# Patient Record
Sex: Male | Born: 1960 | Race: Black or African American | Hispanic: No | Marital: Married | Smoking: Current some day smoker
Health system: Southern US, Community
[De-identification: ages and names within clinical notes are randomized; demographics above are authoritative.]

## PROBLEM LIST (undated history)

## (undated) DIAGNOSIS — M2550 Pain in unspecified joint: Secondary | ICD-10-CM

## (undated) DIAGNOSIS — R350 Frequency of micturition: Secondary | ICD-10-CM

## (undated) DIAGNOSIS — G4730 Sleep apnea, unspecified: Secondary | ICD-10-CM

## (undated) DIAGNOSIS — IMO0001 Reserved for inherently not codable concepts without codable children: Secondary | ICD-10-CM

## (undated) DIAGNOSIS — I219 Acute myocardial infarction, unspecified: Secondary | ICD-10-CM

## (undated) DIAGNOSIS — M549 Dorsalgia, unspecified: Secondary | ICD-10-CM

## (undated) DIAGNOSIS — I639 Cerebral infarction, unspecified: Secondary | ICD-10-CM

## (undated) DIAGNOSIS — T8859XA Other complications of anesthesia, initial encounter: Secondary | ICD-10-CM

## (undated) DIAGNOSIS — M1990 Unspecified osteoarthritis, unspecified site: Secondary | ICD-10-CM

## (undated) DIAGNOSIS — Z9889 Other specified postprocedural states: Secondary | ICD-10-CM

## (undated) DIAGNOSIS — T4145XA Adverse effect of unspecified anesthetic, initial encounter: Secondary | ICD-10-CM

## (undated) DIAGNOSIS — R413 Other amnesia: Secondary | ICD-10-CM

## (undated) DIAGNOSIS — E7800 Pure hypercholesterolemia, unspecified: Secondary | ICD-10-CM

## (undated) DIAGNOSIS — J189 Pneumonia, unspecified organism: Secondary | ICD-10-CM

## (undated) DIAGNOSIS — Z8489 Family history of other specified conditions: Secondary | ICD-10-CM

## (undated) DIAGNOSIS — R4781 Slurred speech: Secondary | ICD-10-CM

## (undated) DIAGNOSIS — F419 Anxiety disorder, unspecified: Secondary | ICD-10-CM

## (undated) DIAGNOSIS — M2540 Effusion, unspecified joint: Secondary | ICD-10-CM

## (undated) DIAGNOSIS — E119 Type 2 diabetes mellitus without complications: Secondary | ICD-10-CM

## (undated) DIAGNOSIS — G459 Transient cerebral ischemic attack, unspecified: Secondary | ICD-10-CM

## (undated) DIAGNOSIS — Z9989 Dependence on other enabling machines and devices: Secondary | ICD-10-CM

## (undated) DIAGNOSIS — I10 Essential (primary) hypertension: Secondary | ICD-10-CM

## (undated) DIAGNOSIS — R112 Nausea with vomiting, unspecified: Secondary | ICD-10-CM

## (undated) HISTORY — PX: JOINT REPLACEMENT: SHX530

## (undated) HISTORY — PX: TONSILLECTOMY: SUR1361

## (undated) HISTORY — PX: BACK SURGERY: SHX140

---
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
## 1985-12-01 DIAGNOSIS — I219 Acute myocardial infarction, unspecified: Secondary | ICD-10-CM

## 1985-12-01 HISTORY — DX: Acute myocardial infarction, unspecified: I21.9

## 2001-06-16 ENCOUNTER — Emergency Department (HOSPITAL_COMMUNITY): Admission: EM | Admit: 2001-06-16 | Discharge: 2001-06-16 | Payer: Self-pay | Admitting: Emergency Medicine

## 2001-06-16 ENCOUNTER — Encounter: Payer: Self-pay | Admitting: Internal Medicine

## 2001-08-26 ENCOUNTER — Emergency Department (HOSPITAL_COMMUNITY): Admission: EM | Admit: 2001-08-26 | Discharge: 2001-08-26 | Payer: Self-pay | Admitting: Unknown Physician Specialty

## 2002-04-20 ENCOUNTER — Encounter: Payer: Self-pay | Admitting: Emergency Medicine

## 2002-04-20 ENCOUNTER — Emergency Department (HOSPITAL_COMMUNITY): Admission: EM | Admit: 2002-04-20 | Discharge: 2002-04-20 | Payer: Self-pay | Admitting: Emergency Medicine

## 2002-11-18 ENCOUNTER — Emergency Department (HOSPITAL_COMMUNITY): Admission: EM | Admit: 2002-11-18 | Discharge: 2002-11-18 | Payer: Self-pay | Admitting: Emergency Medicine

## 2002-11-18 ENCOUNTER — Encounter: Payer: Self-pay | Admitting: Emergency Medicine

## 2002-12-02 ENCOUNTER — Emergency Department (HOSPITAL_COMMUNITY): Admission: EM | Admit: 2002-12-02 | Discharge: 2002-12-02 | Payer: Self-pay | Admitting: Emergency Medicine

## 2003-11-13 ENCOUNTER — Emergency Department (HOSPITAL_COMMUNITY): Admission: AD | Admit: 2003-11-13 | Discharge: 2003-11-13 | Payer: Self-pay | Admitting: Family Medicine

## 2004-04-17 ENCOUNTER — Emergency Department (HOSPITAL_COMMUNITY): Admission: EM | Admit: 2004-04-17 | Discharge: 2004-04-17 | Payer: Self-pay | Admitting: Emergency Medicine

## 2004-04-18 ENCOUNTER — Emergency Department (HOSPITAL_COMMUNITY): Admission: EM | Admit: 2004-04-18 | Discharge: 2004-04-19 | Payer: Self-pay | Admitting: Emergency Medicine

## 2004-05-06 ENCOUNTER — Emergency Department (HOSPITAL_COMMUNITY): Admission: EM | Admit: 2004-05-06 | Discharge: 2004-05-06 | Payer: Self-pay | Admitting: Emergency Medicine

## 2004-11-19 ENCOUNTER — Emergency Department (HOSPITAL_COMMUNITY): Admission: EM | Admit: 2004-11-19 | Discharge: 2004-11-19 | Payer: Self-pay | Admitting: Emergency Medicine

## 2005-01-08 ENCOUNTER — Emergency Department (HOSPITAL_COMMUNITY): Admission: EM | Admit: 2005-01-08 | Discharge: 2005-01-08 | Payer: Self-pay | Admitting: Family Medicine

## 2005-11-25 ENCOUNTER — Emergency Department (HOSPITAL_COMMUNITY): Admission: EM | Admit: 2005-11-25 | Discharge: 2005-11-25 | Payer: Self-pay | Admitting: Family Medicine

## 2006-12-01 HISTORY — PX: ANKLE SURGERY: SHX546

## 2007-02-11 ENCOUNTER — Emergency Department (HOSPITAL_COMMUNITY): Admission: EM | Admit: 2007-02-11 | Discharge: 2007-02-12 | Payer: Self-pay | Admitting: Emergency Medicine

## 2007-02-19 ENCOUNTER — Ambulatory Visit (HOSPITAL_COMMUNITY): Admission: RE | Admit: 2007-02-19 | Discharge: 2007-02-19 | Payer: Self-pay | Admitting: Orthopaedic Surgery

## 2007-02-25 ENCOUNTER — Ambulatory Visit (HOSPITAL_COMMUNITY): Admission: RE | Admit: 2007-02-25 | Discharge: 2007-02-25 | Payer: Self-pay | Admitting: Orthopaedic Surgery

## 2007-02-25 ENCOUNTER — Emergency Department (HOSPITAL_COMMUNITY): Admission: EM | Admit: 2007-02-25 | Discharge: 2007-02-25 | Payer: Self-pay | Admitting: Family Medicine

## 2007-03-02 ENCOUNTER — Emergency Department (HOSPITAL_COMMUNITY): Admission: EM | Admit: 2007-03-02 | Discharge: 2007-03-02 | Payer: Self-pay | Admitting: Emergency Medicine

## 2007-03-03 ENCOUNTER — Ambulatory Visit (HOSPITAL_COMMUNITY): Admission: RE | Admit: 2007-03-03 | Discharge: 2007-03-05 | Payer: Self-pay | Admitting: Orthopaedic Surgery

## 2007-05-25 ENCOUNTER — Ambulatory Visit (HOSPITAL_BASED_OUTPATIENT_CLINIC_OR_DEPARTMENT_OTHER): Admission: RE | Admit: 2007-05-25 | Discharge: 2007-05-25 | Payer: Self-pay | Admitting: Orthopaedic Surgery

## 2008-07-21 ENCOUNTER — Emergency Department (HOSPITAL_COMMUNITY): Admission: EM | Admit: 2008-07-21 | Discharge: 2008-07-21 | Payer: Self-pay | Admitting: Emergency Medicine

## 2009-05-05 ENCOUNTER — Emergency Department (HOSPITAL_COMMUNITY): Admission: EM | Admit: 2009-05-05 | Discharge: 2009-05-06 | Payer: Self-pay | Admitting: Emergency Medicine

## 2009-06-10 ENCOUNTER — Emergency Department (HOSPITAL_COMMUNITY): Admission: EM | Admit: 2009-06-10 | Discharge: 2009-06-10 | Payer: Self-pay | Admitting: Emergency Medicine

## 2009-09-12 ENCOUNTER — Emergency Department (HOSPITAL_COMMUNITY): Admission: EM | Admit: 2009-09-12 | Discharge: 2009-09-12 | Payer: Self-pay | Admitting: Emergency Medicine

## 2010-06-02 ENCOUNTER — Emergency Department (HOSPITAL_COMMUNITY): Admission: EM | Admit: 2010-06-02 | Discharge: 2010-06-02 | Payer: Self-pay | Admitting: Emergency Medicine

## 2010-07-12 ENCOUNTER — Ambulatory Visit (HOSPITAL_COMMUNITY): Admission: RE | Admit: 2010-07-12 | Discharge: 2010-07-12 | Payer: Self-pay | Admitting: Family Medicine

## 2010-11-16 ENCOUNTER — Emergency Department (HOSPITAL_COMMUNITY)
Admission: EM | Admit: 2010-11-16 | Discharge: 2010-11-16 | Payer: Self-pay | Source: Home / Self Care | Admitting: Emergency Medicine

## 2011-02-03 ENCOUNTER — Emergency Department (HOSPITAL_COMMUNITY): Payer: Worker's Compensation

## 2011-02-03 ENCOUNTER — Emergency Department (HOSPITAL_COMMUNITY)
Admission: EM | Admit: 2011-02-03 | Discharge: 2011-02-03 | Disposition: A | Discharge status: Home / Self Care | Payer: Worker's Compensation | Attending: Emergency Medicine | Admitting: Emergency Medicine

## 2011-02-03 ENCOUNTER — Encounter (HOSPITAL_COMMUNITY): Payer: Self-pay | Admitting: Radiology

## 2011-02-03 DIAGNOSIS — R109 Unspecified abdominal pain: Secondary | ICD-10-CM | POA: Insufficient documentation

## 2011-02-03 DIAGNOSIS — I10 Essential (primary) hypertension: Secondary | ICD-10-CM | POA: Insufficient documentation

## 2011-02-03 DIAGNOSIS — E785 Hyperlipidemia, unspecified: Secondary | ICD-10-CM | POA: Insufficient documentation

## 2011-02-03 HISTORY — DX: Essential (primary) hypertension: I10

## 2011-02-03 LAB — COMPREHENSIVE METABOLIC PANEL WITH GFR
ALT: 69 U/L — ABNORMAL HIGH (ref 0–53)
AST: 50 U/L — ABNORMAL HIGH (ref 0–37)
BUN: 13 mg/dL (ref 6–23)
Chloride: 99 meq/L (ref 96–112)
GFR calc non Af Amer: 55 mL/min — ABNORMAL LOW (ref >60)
Potassium: 3.7 meq/L (ref 3.5–5.1)
Total Protein: 8.2 g/dL (ref 6.0–8.3)

## 2011-02-03 LAB — CBC
HCT: 42.8 % (ref 39.0–52.0)
Hemoglobin: 14.6 g/dL (ref 13.0–17.0)
MCH: 30 pg (ref 26.0–34.0)
MCHC: 34.1 g/dL (ref 30.0–36.0)
MCV: 88.1 fL (ref 78.0–100.0)
Platelets: 246 10*3/uL (ref 150–400)
RBC: 4.86 MIL/uL (ref 4.22–5.81)
RDW: 13.9 % (ref 11.5–15.5)
WBC: 7.7 10*3/uL (ref 4.0–10.5)

## 2011-02-03 LAB — DIFFERENTIAL
Basophils Absolute: 0 K/uL (ref 0.0–0.1)
Basophils Relative: 0 % (ref 0–1)
Eosinophils Absolute: 0.1 10*3/uL (ref 0.0–0.7)
Eosinophils Relative: 2 % (ref 0–5)
Lymphocytes Relative: 34 % (ref 12–46)
Lymphs Abs: 2.6 10*3/uL (ref 0.7–4.0)
Monocytes Absolute: 0.5 K/uL (ref 0.1–1.0)
Monocytes Relative: 7 % (ref 3–12)
Neutro Abs: 4.4 10*3/uL (ref 1.7–7.7)
Neutrophils Relative %: 57 % (ref 43–77)

## 2011-02-03 LAB — URINE MICROSCOPIC-ADD ON

## 2011-02-03 LAB — URINALYSIS, ROUTINE W REFLEX MICROSCOPIC
Glucose, UA: NEGATIVE mg/dL
Hgb urine dipstick: NEGATIVE
Ketones, ur: NEGATIVE mg/dL
Leukocytes, UA: NEGATIVE
Nitrite: NEGATIVE
Protein, ur: 100 mg/dL — AB
Specific Gravity, Urine: >1.03 — ABNORMAL HIGH (ref 1.005–1.030)
Urobilinogen, UA: 0.2 mg/dL (ref 0.0–1.0)
pH: 5.5 (ref 5.0–8.0)

## 2011-02-03 LAB — LIPASE, BLOOD: Lipase: 43 U/L (ref 11–59)

## 2011-02-03 LAB — D-DIMER, QUANTITATIVE: D-Dimer, Quant: 0.25 ug{FEU}/mL (ref 0.00–0.48)

## 2011-02-03 LAB — COMPREHENSIVE METABOLIC PANEL
Albumin: 3.9 g/dL (ref 3.5–5.2)
Alkaline Phosphatase: 107 U/L (ref 39–117)
CO2: 28 mEq/L (ref 19–32)
Calcium: 9.7 mg/dL (ref 8.4–10.5)
Creatinine, Ser: 1.37 mg/dL (ref 0.4–1.5)
GFR calc Af Amer: >60 mL/min (ref >60)
Glucose, Bld: 177 mg/dL — ABNORMAL HIGH (ref 70–99)
Sodium: 137 mEq/L (ref 135–145)
Total Bilirubin: 0.7 mg/dL (ref 0.3–1.2)

## 2011-02-16 LAB — DIFFERENTIAL
Basophils Absolute: 0 10*3/uL (ref 0.0–0.1)
Basophils Relative: 0 % (ref 0–1)
Eosinophils Absolute: 0.1 10*3/uL (ref 0.0–0.7)
Neutro Abs: 3.9 10*3/uL (ref 1.7–7.7)
Neutrophils Relative %: 64 % (ref 43–77)

## 2011-02-16 LAB — BASIC METABOLIC PANEL
BUN: 16 mg/dL (ref 6–23)
Chloride: 105 mEq/L (ref 96–112)
Creatinine, Ser: 1.1 mg/dL (ref 0.4–1.5)
GFR calc non Af Amer: >60 mL/min (ref >60)
Potassium: 3.7 mEq/L (ref 3.5–5.1)
Sodium: 136 mEq/L (ref 135–145)

## 2011-02-16 LAB — CBC
Hemoglobin: 13.2 g/dL (ref 13.0–17.0)
MCH: 30.4 pg (ref 26.0–34.0)
Platelets: 178 10*3/uL (ref 150–400)
RBC: 4.33 MIL/uL (ref 4.22–5.81)

## 2011-03-06 LAB — URINALYSIS, ROUTINE W REFLEX MICROSCOPIC
Bilirubin Urine: NEGATIVE
Glucose, UA: NEGATIVE mg/dL
Hgb urine dipstick: NEGATIVE
Ketones, ur: NEGATIVE mg/dL
Leukocytes, UA: NEGATIVE
Nitrite: NEGATIVE
Protein, ur: 30 mg/dL — AB
Specific Gravity, Urine: >1.03 — ABNORMAL HIGH (ref 1.005–1.030)
Urobilinogen, UA: 0.2 mg/dL (ref 0.0–1.0)

## 2011-03-06 LAB — URINE CULTURE: Culture: NO GROWTH

## 2011-03-25 ENCOUNTER — Emergency Department (HOSPITAL_COMMUNITY)
Admission: EM | Admit: 2011-03-25 | Discharge: 2011-03-25 | Disposition: A | Discharge status: Home / Self Care | Payer: Self-pay | Attending: Emergency Medicine | Admitting: Emergency Medicine

## 2011-03-25 DIAGNOSIS — E785 Hyperlipidemia, unspecified: Secondary | ICD-10-CM | POA: Insufficient documentation

## 2011-03-25 DIAGNOSIS — I10 Essential (primary) hypertension: Secondary | ICD-10-CM | POA: Insufficient documentation

## 2011-03-25 DIAGNOSIS — Z79899 Other long term (current) drug therapy: Secondary | ICD-10-CM | POA: Insufficient documentation

## 2011-03-25 DIAGNOSIS — M25579 Pain in unspecified ankle and joints of unspecified foot: Secondary | ICD-10-CM | POA: Insufficient documentation

## 2011-04-15 NOTE — Op Note (Signed)
Raymond Barrera, Raymond Barrera                ACCOUNT NO.:  [id number]   MEDICAL RECORD NO.:  [id number]          PATIENT TYPE:  AMB   LOCATION:  DSC                          FACILITY:  [facility]   PHYSICIAN:  Claude Manges. Whitfield, M.D.DATE OF BIRTH:  [birth year]   DATE OF PROCEDURE:  05/25/2007  DATE OF DISCHARGE:                               OPERATIVE REPORT   PREOPERATIVE DIAGNOSIS:  Retained diastasis screw, left ankle.   POSTOPERATIVE DIAGNOSIS:  Retained diastasis screw, left ankle.   PROCEDURE:  Removal of left ankle diastasis screw.   SURGEON:  Claude Manges. Cleophas Dunker, M.D.   ANESTHESIA:  MAC with local Marcaine with epinephrine.   COMPLICATIONS:  None.   HISTORY:  50 year old African American male sustained a displaced  bimalleolar fracture of his left ankle in late March and was taken to  the operating room March 03, 2007, for open reduction and internal  fixation. He was noted to have diastasis and a diastasis screw was  placed through the lateral fibular plate across the tibia fibular joint  into the tibia.  There is evidence the fracture has healed and the  diastasis screw is intact, and now it is time for removal of diastasis  screw.   DESCRIPTION OF PROCEDURE:  With the patient comfortable on the operating  table under MAC anesthesia, the right lower extremity was prepped with  DuraPrep from the tips of the toes to the knee.  Sterile draping was  performed.  The OEC was utilized to localize the level of the diastasis  screw. That area along the lateral ankle incision was infiltrated with  Marcaine with epinephrine. A 15 blade knife was used to incise the skin  and bluntly open the wound about 1 inch in length so I could visualize  the fracture.  There was very minimal bleeding.  The screw was localized  and it was removed with a hexagonal head screwdriver.  As it was the  only screw inserted with any length then further x-rays were not  necessary.  The wound was irrigated with saline  solution.  The subcu was  closed with interrupted 2-0 Vicryl, skin with interrupted simple 3-0  Ethilon.  A sterile bulky dressing was applied followed by an Ace  bandage.  The patient tolerated the procedure without complications.   PLAN:  Vicodin for pain.  He has the Futures trader.  He will bear  weight to tolerance.  Office in one week.      Claude Manges. Cleophas Dunker, M.D.  Electronically Signed     PWW/MEDQ  D:  05/25/2007  T:  05/25/2007  Job:  [id number]

## 2011-04-18 NOTE — Op Note (Signed)
NAMEORLEN, LEEDY                ACCOUNT NO.:  [id number]   MEDICAL RECORD NO.:  [id number]          PATIENT TYPE:  AMB   LOCATION:  SDS                          FACILITY:  [facility]   PHYSICIAN:  Claude Manges. Whitfield, M.D.DATE OF BIRTH:  June 21, 1961   DATE OF PROCEDURE:  03/03/2007  DATE OF DISCHARGE:                               OPERATIVE REPORT   PREOPERATIVE DIAGNOSIS:  Displaced trimalleolar fracture left ankle.   POSTOPERATIVE DIAGNOSIS:  Displaced trimalleolar fracture left ankle.   PROCEDURE:  Open reduction internal fixation of bimalleolar component of  trimalleolar fracture left ankle.   SURGEON:  Claude Manges. Cleophas Dunker, M.D.   ASSISTANT:  Arlys John D. Petrarca, P.A.-C.   ANESTHESIA:  General orotracheal.   COMPLICATIONS:  None.   HISTORY:  A 50 year old gentleman who is approximately 3 weeks status  post fracture dislocation of his left ankle.  He was initially seen  early in the morning with a posteriorly displaced ankle associated with  a trimalleolar fracture.  There was a very small posterior plafond  fracture.  After reduction the posterior plafond fracture was  essentially anatomic, but there was still an opening along the medial  aspect of the ankle, consistent with the deltoid ligament rupture  without evidence of a fracture; and there was a displaced distal fibular  fracture.   He was initially scheduled for surgery, but he was a no show at the  office; and then related that he had had cocaine; and was therefore was  postponed.  Then on the second attempt at surgery, approximately 1-1/2  weeks later, he was noted to have elevated, and a previously undiagnosed  and previously treated hypertension.  His blood pressure is under  control; he is on medicine; and now is to have open reduction internal  fixation.   DESCRIPTION OF PROCEDURE:  The patient comfortable on operating room  table under general orotracheal anesthesia, a tourniquet was applied to  the left lower  extremity.  The leg was prepped with Betadine scrub and  DuraPrep from the tips of the toes to the knee.  Sterile draping was  performed.  With extremity still elevated, it was Esmarch exsanguinated  with a proximal tourniquet at 350 mmHg.   Initial procedure was performed along the medial aspect of the left  angle.  A longitudinal incision was made beginning at the very tip of  the medial malleolus extending distally via sharp dissection; the  incision was carried down to the subcutaneous tissue.  There was a lot  of indurated tissue consistent with an injury from 3 weeks.  By blunt  dissection the soft tissue was elevated off the deltoid ligament and the  posterior tibial tendon.  There was an obvious rupture of the deltoid  ligament from its distal attachment.  We could easily look into the  joint.  The posterior tibial tendon was in its anatomic position.  There  was a small fragment of bone, that was attached to the ligament that had  invaginated; and this was removed with a rongeur.  At that point we were  able to close the  space between the talus and the medial malleolus.   Incision was then made longitudinally over the distal fibula; via sharp  dissection carried down to subcutaneous tissue.  Then via blunt  dissection the soft tissue was elevated off the distal fibula.  The  fracture was obviously displaced.  There was some early callus  formation; this had to be removed so we could visualize the fracture.  Distal fragment was located lateral to the more proximal fragment; there  was some comminution.  After freeing the soft tissue, we could reduce  the fracture.  The fracture was maintained with a bone clamp and a 7-  hole semitubular titanium plate was then applied.  Each of the holes  were drilled, measured, and filled with self-tapping screws.  The  proximal 3 holes were filled with cortical screws; the distal 3 holes  with cancellus screws.  We thought that there probably  was a diastasis;  and accordingly the fourth hole, proximally, was drilled across the  tibia with the ankle in about neutral and maintaining reduced position;  a 60 mm i.e., the longest screw in the set was then placed across the  fibula into the tibia with an excellent purchase.  We thought this  maintained the reduction.  The deltoid ligament was then repaired using  #1 Ethibond.  It was sutured to its stump distally; and then using 3  sutures. Films revealed very nice position of the fracture.   Each the wounds were then irrigated.  The wounds were closed with Vicryl  and skin clips.  Sterile bulky dressing was applied.  Marcaine was used  to infiltrate the wound.  Sterile bulky dressing was applied followed by  a splint with the foot in inversion.  Tourniquet was deflated with  immediate capillary refill to the toes.   PLAN:  Vicodin for pain.  Office 1 week, going to do this as an  outpatient.      Claude Manges. Cleophas Dunker, M.D.  Electronically Signed     PWW/MEDQ  D:  03/03/2007  T:  03/03/2007  Job:  [id number]
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
## 2011-05-01 ENCOUNTER — Ambulatory Visit: Payer: Self-pay

## 2011-05-01 ENCOUNTER — Other Ambulatory Visit: Payer: Self-pay | Admitting: Occupational Medicine

## 2011-05-01 DIAGNOSIS — R52 Pain, unspecified: Secondary | ICD-10-CM

## 2011-10-04 ENCOUNTER — Emergency Department (HOSPITAL_COMMUNITY)
Admission: EM | Admit: 2011-10-04 | Discharge: 2011-10-04 | Disposition: A | Discharge status: Home / Self Care | Payer: No Typology Code available for payment source | Attending: Emergency Medicine | Admitting: Emergency Medicine

## 2011-10-04 ENCOUNTER — Encounter (HOSPITAL_COMMUNITY): Payer: Self-pay

## 2011-10-04 ENCOUNTER — Emergency Department (HOSPITAL_COMMUNITY): Payer: No Typology Code available for payment source

## 2011-10-04 DIAGNOSIS — S139XXA Sprain of joints and ligaments of unspecified parts of neck, initial encounter: Secondary | ICD-10-CM | POA: Insufficient documentation

## 2011-10-04 DIAGNOSIS — F17200 Nicotine dependence, unspecified, uncomplicated: Secondary | ICD-10-CM | POA: Insufficient documentation

## 2011-10-04 DIAGNOSIS — M542 Cervicalgia: Secondary | ICD-10-CM | POA: Insufficient documentation

## 2011-10-04 DIAGNOSIS — S161XXA Strain of muscle, fascia and tendon at neck level, initial encounter: Secondary | ICD-10-CM

## 2011-10-04 DIAGNOSIS — I10 Essential (primary) hypertension: Secondary | ICD-10-CM | POA: Insufficient documentation

## 2011-10-04 DIAGNOSIS — E7800 Pure hypercholesterolemia, unspecified: Secondary | ICD-10-CM | POA: Insufficient documentation

## 2011-10-04 HISTORY — DX: Pure hypercholesterolemia, unspecified: E78.00

## 2011-10-04 MED ORDER — CYCLOBENZAPRINE HCL 10 MG PO TABS
10.0000 mg | ORAL_TABLET | Freq: Once | ORAL | Status: AC
  Administered 2011-10-04: 10 mg via ORAL
  Filled 2011-10-04: qty 1

## 2011-10-04 MED ORDER — CYCLOBENZAPRINE HCL 10 MG PO TABS: ORAL_TABLET | ORAL | Status: DC

## 2011-10-04 MED ORDER — IBUPROFEN 800 MG PO TABS
800.0000 mg | ORAL_TABLET | Freq: Once | ORAL | Status: AC
  Administered 2011-10-04: 800 mg via ORAL
  Filled 2011-10-04: qty 1
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
## 2011-10-04 NOTE — ED Provider Notes (Signed)
Medical screening examination/treatment/procedure(s) were performed by non-physician practitioner and as supervising physician I was immediately available for consultation/collaboration.   Dayton Bailiff, MD 10/04/11 2226
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
## 2012-03-10 ENCOUNTER — Encounter (HOSPITAL_COMMUNITY): Payer: Self-pay | Admitting: *Deleted

## 2012-03-10 ENCOUNTER — Emergency Department (HOSPITAL_COMMUNITY)
Admission: EM | Admit: 2012-03-10 | Discharge: 2012-03-10 | Disposition: A | Discharge status: Home / Self Care | Payer: Self-pay | Attending: Emergency Medicine | Admitting: Emergency Medicine

## 2012-03-10 DIAGNOSIS — F17200 Nicotine dependence, unspecified, uncomplicated: Secondary | ICD-10-CM | POA: Insufficient documentation

## 2012-03-10 DIAGNOSIS — R040 Epistaxis: Secondary | ICD-10-CM | POA: Insufficient documentation

## 2012-03-10 DIAGNOSIS — E7800 Pure hypercholesterolemia, unspecified: Secondary | ICD-10-CM | POA: Insufficient documentation

## 2012-03-10 DIAGNOSIS — I10 Essential (primary) hypertension: Secondary | ICD-10-CM | POA: Insufficient documentation

## 2012-03-10 MED ORDER — ONDANSETRON 4 MG PO TBDP
4.0000 mg | ORAL_TABLET | Freq: Once | ORAL | Status: AC
  Administered 2012-03-10: 4 mg via ORAL
  Filled 2012-03-10: qty 1

## 2012-03-10 MED ORDER — OXYMETAZOLINE HCL 0.05 % NA SOLN
NASAL | Status: AC
  Administered 2012-03-10: 2 via NASAL
  Filled 2012-03-10: qty 15

## 2012-03-10 MED ORDER — CLONIDINE HCL 0.2 MG PO TABS
0.2000 mg | ORAL_TABLET | Freq: Once | ORAL | Status: AC
  Administered 2012-03-10: 0.2 mg via ORAL
  Filled 2012-03-10: qty 1

## 2012-03-10 MED ORDER — OXYMETAZOLINE HCL 0.05 % NA SOLN
2.0000 | Freq: Once | NASAL | Status: AC
  Administered 2012-03-10: 2 via NASAL

## 2012-03-10 NOTE — ED Notes (Signed)
No bleeding at present.  No drainage down the back of his throat.

## 2012-03-10 NOTE — Discharge Instructions (Signed)
Use the Afrin 2 squirts in the left nostril twice a day for the next 3 days. If the bleeding should recur, apply pressure. Try not to blow your nose for the next 3 days. Return to the emergency room if you're unable to stop bleeding. Continue to take your blood pressure medicine. Continue to try and find a primary care physician.   Nosebleed A nosebleed can be caused by many things, including:  Getting hit hard in the nose.   Infections.   Dry nose.   Colds.   Medicines.  Your doctor may do lab testing if you get nosebleeds a lot and the cause is not known. HOME CARE   If your nose was packed with material, keep it there until your doctor takes it out. Put the pack back in your nose if the pack falls out.   Do not blow your nose for 12 hours after the nosebleed.   Sit up and bend forward if your nose starts bleeding again. Pinch the front half of your nose nonstop for 20 minutes.   Put petroleum jelly inside your nose every morning if you have a dry nose.   Use a humidifier to make the air less dry.   Do not take aspirin.   Try not to strain, lift, or bend at the waist for many days after the nosebleed.  GET HELP RIGHT AWAY IF:   Nosebleeds keep happening and are hard to stop or control.   You have bleeding or bruises that are not normal on other parts of the body.   You have a fever.   The nosebleeds get worse.   You get lightheaded, feel faint, sweaty, or throw up (vomit) blood.  MAKE SURE YOU:   Understand these instructions.   Will watch your condition.   Will get help right away if you are not doing well or get worse.  Document Released: 08/26/2008 Document Revised: 11/06/2011 Document Reviewed: 08/26/2008 [facility] Patient Information 2012 [place], [place].

## 2012-03-10 NOTE — ED Notes (Signed)
Reports nosebleed from left nare onset 0430 today waking him from sleep; states also had nosebleed last night at work lasting approx 20 minutes.  Reports intermittent HA x 6 months; states, "I'm just stressed out".

## 2012-03-10 NOTE — ED Notes (Signed)
Nose packing tray and Afrin Nasal Spray at bedside.

## 2012-03-10 NOTE — ED Provider Notes (Signed)
History     CSN: [id number]  Arrival date & time 03/10/12  0458   First MD Initiated Contact with Patient 03/10/12 0544      Chief Complaint  Patient presents with  . Epistaxis    (Consider location/radiation/quality/duration/timing/severity/associated sxs/prior treatment) HPI Raymond Barrera is a 51 y.o. male who presents to the Emergency Department complaining of a left sided nosebleed that began this morning at 4 AM. He had a nosebleed yesterday evening at work which was stopped with pressure. Had additional bleeding again last night just prior to going to bed which stopped with pressure. Awakened at 4 AM with active bleeding from the left naris.  No PCP Past Medical History  Diagnosis Date  . Hypertension   . High cholesterol     Past Surgical History  Procedure Date  . Ankle surgery     No family history on file.  History  Substance Use Topics  . Smoking status: Current Everyday Smoker -- 0.5 packs/day  . Smokeless tobacco: Not on file  . Alcohol Use: No      Review of Systems ROS: Statement: All systems negative except as marked or noted in the HPI; Constitutional: Negative for fever and chills. ; ; Eyes: Negative for eye pain, redness and discharge. ; ; ENMT: Negative for ear pain, hoarseness,  sinus pressure and sore throat. ; ; Cardiovascular: Negative for chest pain, palpitations, diaphoresis, dyspnea and peripheral edema. ; ; Respiratory: Negative for cough, wheezing and stridor. ; ; Gastrointestinal: Negative for nausea, vomiting, diarrhea, abdominal pain, blood in stool, hematemesis, jaundice and rectal bleeding. . ; ; Genitourinary: Negative for dysuria, flank pain and hematuria. ; ; Musculoskeletal: Negative for back pain and neck pain. Negative for swelling and trauma.; ; Skin: Negative for pruritus, rash, abrasions, blisters, bruising and skin lesion.; ; Neuro: Negative for headache, lightheadedness and neck stiffness. Negative for weakness, altered level of  consciousness , altered mental status, extremity weakness, paresthesias, involuntary movement, seizure and syncope.     Allergies  Review of patient's allergies indicates no known allergies.  Home Medications   Current Outpatient Rx  Name Route Sig Dispense Refill  . LISINOPRIL-HYDROCHLOROTHIAZIDE 10-12.5 MG PO TABS Oral Take by mouth daily. Pt does not know dose-did not bring meds    . PRAVASTATIN SODIUM 10 MG PO TABS Oral Take by mouth daily. Pt does not know dose-did not bring meds    . CYCLOBENZAPRINE HCL 10 MG PO TABS  1/2 to one po TID prn muscle stiffness. 20 tablet 0    BP 154/108  Pulse 96  Temp(Src) 98.3 F (36.8 C) (Oral)  Resp 20  Ht 6\' 6"  (1.981 m)  Wt 270 lb (122.471 kg)  BMI 31.20 kg/m2  SpO2 97%  Physical Exam Physical examination:  Nursing notes reviewed; Vital signs and O2 SAT reviewed;  Constitutional: Well developed, Well nourished, Well hydrated, In no acute distress; Head:  Normocephalic, atraumatic; Eyes: EOMI, PERRL, No scleral icterus; ENMT: Mouth and pharynx normal, Mucous membranes moist;  Trickle of blood from left naris. Unable to visualize area of bleeding. Neck: Supple, Full range of motion, No lymphadenopathy; Cardiovascular: Regular rate and rhythm, No murmur, rub, or gallop; Respiratory: Breath sounds clear & equal bilaterally, No rales, rhonchi, wheezes, or rub, Normal respiratory effort/excursion; Chest: Nontender, Movement normal; Abdomen: Soft, Nontender, Nondistended, Normal bowel sounds; Genitourinary: No CVA tenderness; Extremities: Pulses normal, No tenderness, No edema, No calf edema or asymmetry.; Neuro: AA&Ox3, Major CN grossly intact.  No gross focal motor  or sensory deficits in extremities.; Skin: Color normal, Warm, Dry  ED Course  Procedures (including critical care time)    MDM  The patient was nosebleed that began yesterday. It was associated with hypertension. He said this just restarted his antihypertensives on Sunday. Applied  Afrin x2 with resolution of bleeding. He was given clonidine 0.2 mg with improvement in the blood pressure. He is being sent home with the Afrin.Pt stable in ED with no significant deterioration in condition.The patient appears reasonably screened and/or stabilized for discharge and I doubt any other medical condition or other [facility] requiring further screening, evaluation, or treatment in the ED at this time prior to discharge.  MDM Reviewed: nursing note and vitals           Nicoletta Dress. Colon Branch, MD 03/10/12 [phone]

## 2012-03-12 ENCOUNTER — Emergency Department (HOSPITAL_COMMUNITY): Admission: EM | Admit: 2012-03-12 | Discharge: 2012-03-12 | Discharge status: Against Medical Advice | Payer: Self-pay

## 2012-03-12 NOTE — ED Notes (Signed)
Pt left without signing the ama paperwork. 

## 2012-08-31 ENCOUNTER — Emergency Department (HOSPITAL_COMMUNITY): Payer: Self-pay

## 2012-08-31 ENCOUNTER — Encounter (HOSPITAL_COMMUNITY): Payer: Self-pay

## 2012-08-31 ENCOUNTER — Emergency Department (HOSPITAL_COMMUNITY)
Admission: EM | Admit: 2012-08-31 | Discharge: 2012-08-31 | Disposition: A | Discharge status: Home / Self Care | Payer: Self-pay | Attending: Emergency Medicine | Admitting: Emergency Medicine

## 2012-08-31 DIAGNOSIS — E7800 Pure hypercholesterolemia, unspecified: Secondary | ICD-10-CM | POA: Insufficient documentation

## 2012-08-31 DIAGNOSIS — M542 Cervicalgia: Secondary | ICD-10-CM | POA: Insufficient documentation

## 2012-08-31 DIAGNOSIS — F17200 Nicotine dependence, unspecified, uncomplicated: Secondary | ICD-10-CM | POA: Insufficient documentation

## 2012-08-31 DIAGNOSIS — D179 Benign lipomatous neoplasm, unspecified: Secondary | ICD-10-CM

## 2012-08-31 DIAGNOSIS — I10 Essential (primary) hypertension: Secondary | ICD-10-CM | POA: Insufficient documentation

## 2012-08-31 DIAGNOSIS — D170 Benign lipomatous neoplasm of skin and subcutaneous tissue of head, face and neck: Secondary | ICD-10-CM | POA: Insufficient documentation

## 2012-08-31 MED ORDER — OXYCODONE-ACETAMINOPHEN 5-325 MG PO TABS
1.0000 | ORAL_TABLET | Freq: Once | ORAL | Status: AC
  Administered 2012-08-31: 1 via ORAL
  Filled 2012-08-31: qty 1

## 2012-08-31 MED ORDER — OXYCODONE-ACETAMINOPHEN 5-325 MG PO TABS
1.0000 | ORAL_TABLET | ORAL | Status: DC | PRN
Start: 2012-08-31 — End: 2012-09-09

## 2012-08-31 MED ORDER — MELOXICAM 7.5 MG PO TABS: 7.5000 mg | ORAL_TABLET | Freq: Every day | ORAL | Status: DC

## 2012-08-31 NOTE — ED Notes (Signed)
Pt reports having "lump" that swells to back of neck area for years, swelling goes up and down, no drainage.  Area has been more painful for 4 months.  Has never been to md for treatment.

## 2012-08-31 NOTE — ED Provider Notes (Signed)
History     CSN: [id number]  Arrival date & time 08/31/12  1419   First MD Initiated Contact with Patient 08/31/12 1440      Chief Complaint  Patient presents with  . Wound Check    (Consider location/radiation/quality/duration/timing/severity/associated sxs/prior treatment) HPI Comments: Patient c/o painful "lump" at the base of his neck for "years".  States the area swells at times and has been more painful for 3-4 months.  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  He denies numbness or weakness of the UE's, Headaches, visual changes redness , drainage or fever.  Denies previous medical evaluation  Patient is a 51 y.o. male presenting with neck injury. The history is provided by the patient.  Neck Injury This is a chronic problem. The current episode started more than 1 month ago. The problem occurs constantly. The problem has been gradually worsening. Associated symptoms include arthralgias and neck pain. Pertinent negatives include no chest pain, chills, congestion, diaphoresis, fever, headaches, joint swelling, nausea, numbness, rash, sore throat, swollen glands, visual change, vomiting or weakness. The symptoms are aggravated by bending and twisting. He has tried NSAIDs for the symptoms. The treatment provided no relief.    Past Medical History  Diagnosis Date  . Hypertension   . High cholesterol     Past Surgical History  Procedure Date  . Ankle surgery     No family history on file.  History  Substance Use Topics  . Smoking status: Current Every Day Smoker -- 0.5 packs/day    Types: Cigarettes  . Smokeless tobacco: Not on file  . Alcohol Use: No      Review of Systems  Constitutional: Negative for fever, chills and diaphoresis.  HENT: Positive for neck pain. Negative for congestion, sore throat, facial swelling and neck stiffness.   Eyes: Negative for visual disturbance.  Respiratory: Negative for chest tightness and shortness  of breath.   Cardiovascular: Negative for chest pain.  Gastrointestinal: Negative for nausea and vomiting.  Genitourinary: Negative for dysuria and difficulty urinating.  Musculoskeletal: Positive for arthralgias. Negative for back pain, joint swelling and gait problem.  Skin: Negative for color change, rash and wound.       "lump" to the left neck  Neurological: Negative for dizziness, syncope, facial asymmetry, speech difficulty, weakness, numbness and headaches.  All other systems reviewed and are negative.    Allergies  Review of patient's allergies indicates no known allergies.  Home Medications   Current Outpatient Rx  Name Route Sig Dispense Refill  . ASPIRIN 325 MG PO TABS Oral Take 325 mg by mouth daily as needed. For pain    . NAPROXEN SODIUM 220 MG PO TABS Oral Take 440 mg by mouth daily as needed. For pain    . MELOXICAM 7.5 MG PO TABS Oral Take 1 tablet (7.5 mg total) by mouth daily. Take with food 20 tablet 0  . OXYCODONE-ACETAMINOPHEN 5-325 MG PO TABS Oral Take 1 tablet by mouth every 4 (four) hours as needed for pain. 20 tablet 0    BP 149/99  Pulse 80  Temp 98.1 F (36.7 C) (Oral)  Resp 20  Ht 6\' 6"  (1.981 m)  Wt 287 lb (130.182 kg)  BMI 33.17 kg/m2  SpO2 96%  Physical Exam  Nursing note and vitals reviewed. Constitutional: He is oriented to person, place, and time. He appears well-developed and well-nourished. No distress.  HENT:  Head: Normocephalic and atraumatic.  Mouth/Throat: Oropharynx  is clear and moist.  Eyes: EOM are normal. Pupils are equal, round, and reactive to light.  Neck: Phonation normal. Muscular tenderness present. No spinous process tenderness present. No rigidity. Decreased range of motion present. No erythema present. No Brudzinski's sign and no Kernig's sign noted. No thyromegaly present.         ttp of the leftcervical paraspinal muscles.  Localized palpable mass to the base of the left neck.  Grip strength is strong and equal  bilaterally.  Radial pulse is strong, Sensation intact,  CR < 2 sec.  No drainage, excessive warmth or erythema   Cardiovascular: Normal rate, regular rhythm, normal heart sounds and intact distal pulses.   No murmur heard. Pulmonary/Chest: Effort normal and breath sounds normal. No respiratory distress. He exhibits no tenderness.  Musculoskeletal: He exhibits tenderness. He exhibits no edema.       Cervical back: He exhibits tenderness. He exhibits normal range of motion, no bony tenderness, no swelling, no deformity, no spasm and normal pulse.  Lymphadenopathy:    He has no cervical adenopathy.  Neurological: He is alert and oriented to person, place, and time. No cranial nerve deficit or sensory deficit. He exhibits normal muscle tone. Coordination normal.  Reflex Scores:      Tricep reflexes are 2+ on the right side and 2+ on the left side.      Bicep reflexes are 2+ on the right side and 2+ on the left side.      Brachioradialis reflexes are 2+ on the right side and 2+ on the left side. Skin: Skin is warm and dry.       See MS exam    ED Course  Procedures (including critical care time)  Labs Reviewed - No data to display Dg Cervical Spine Complete  08/31/2012  *RADIOLOGY REPORT*  Clinical Data: Wound check.  CERVICAL SPINE - COMPLETE 4+ VIEW  Comparison: 10/04/2011  Findings: Loss of normal cervical lordosis, stable.  Degenerative disc disease changes in the mid and lower cervical spine.  No fracture or subluxation.  Prevertebral soft tissues are normal. The  IMPRESSION: Degenerative disc disease and cervical straightening.  No acute findings.   Original Report Authenticated By: Cyndie Chime, M.D.      1. Lipoma   2. Neck pain       MDM     Palpable mass to the skin of the left neck is likely a lipoma.  No redness, skin dimpling, drainage, open pore or induration to suggest abscess or sebaceous cyst.    Pt agrees to f/u with gen surgery to discuss possible excision.   Will give referral.  No focal neuro deficits.  Pt is well appearing  The patient appears reasonably screened and/or stabilized for discharge and I doubt any other medical condition or other [facility] requiring further screening, evaluation, or treatment in the ED at this time prior to discharge.   Prescribed: mobic Percocet #20     Kynslei Art L. Vincen Bejar, PA 09/02/12 1700

## 2012-09-04 NOTE — ED Provider Notes (Signed)
Medical screening examination/treatment/procedure(s) were performed by non-physician practitioner and as supervising physician I was immediately available for consultation/collaboration.   Bradly Sangiovanni L Jasenia Weilbacher, MD 09/04/12 1616 

## 2012-09-09 ENCOUNTER — Encounter (HOSPITAL_COMMUNITY): Payer: Self-pay | Admitting: Pharmacy Technician

## 2012-09-09 NOTE — H&P (Signed)
NTS SOAP Note  Vital Signs:  Vitals as of: 09/09/2012: Systolic 177: Diastolic 117: Heart Rate 76: Temp 97.66F: Height 109ft 6in: Weight 280Lbs 0 Ounces: Pain Level 8: BMI 32  BMI : 32.36 kg/m2  Subjective: This 51 Years 53 Months old Male presents for of an enlarging lump on his back.  Has been present for one years, is increasing in size and causing him discomfort.  No drainage noted.  Review of Symptoms:    headache, dizzy Head:unremarkable    Eyes:  blurred vision bilateral Cardiovascular:  unremarkable   Respiratory:unremarkable   Gastrointestin    abdominal pain Genitourinary:unremarkable       joint, back, neck pain Hematolgic/Lymphatic:unremarkable     Allergic/Immunologic:unremarkable     Past Medical History:    Reviewed   Past Medical History  Surgical History: none Medical Problems: none Allergies: nkda Medications: percocet, meloxicam   Social History:Reviewed  Social History  Preferred Language: English ([place]) Race:  Black or African American Ethnicity: Not Hispanic / Latino Age: 51 Years 53 Months Marital Status:  M Alcohol: socially Recreational drug(s):  No   Smoking Status: Current every day smoker reviewed on 09/09/2012 Started Date: 12/01/1978 Packs per day: 1.00   Family History:  Reviewed   Family History              Father:  Hypertension, Diabetes Type II, Coronary Artery Disease    Objective Information: General:  Well appearing, well nourished in no distress.      4cm ovoid mass left upper back.  Tender to touch.  No erythema, drainage. Heart:  RRR, no murmur Lungs:    CTA bilaterally, no wheezes, rhonchi, rales.  Breathing unlabored.  Assessment:Neoplasm, back, probable lipoma or sebaceous cyst  Diagnosis &amp; Procedure: DiagnosisCode: 214.1, ProcedureCode: 16109,    Plan:Scheduled for excision of mass, back on 09/13/12.   Patient Education:Alternative treatments  to surgery were discussed with patient (and family).  Risks and benefits  of procedure were fully explained to the patient (and family) who gave informed consent. Patient/family questions were addressed.  Follow-up:Pending Surgery

## 2012-09-10 ENCOUNTER — Encounter (HOSPITAL_COMMUNITY)
Admission: RE | Admit: 2012-09-10 | Discharge: 2012-09-10 | Disposition: A | Discharge status: Home / Self Care | Payer: Self-pay | Source: Ambulatory Visit | Attending: General Surgery | Admitting: General Surgery

## 2012-09-10 ENCOUNTER — Encounter (HOSPITAL_COMMUNITY): Payer: Self-pay

## 2012-09-10 HISTORY — DX: Other complications of anesthesia, initial encounter: T88.59XA

## 2012-09-10 HISTORY — DX: Acute myocardial infarction, unspecified: I21.9

## 2012-09-10 LAB — CBC
HCT: 44.8 % (ref 39.0–52.0)
Hemoglobin: 15.5 g/dL (ref 13.0–17.0)
MCH: 30.3 pg (ref 26.0–34.0)
MCHC: 34.6 g/dL (ref 30.0–36.0)
MCV: 87.5 fL (ref 78.0–100.0)

## 2012-09-10 LAB — BASIC METABOLIC PANEL
BUN: 19 mg/dL (ref 6–23)
Calcium: 10.4 mg/dL (ref 8.4–10.5)
Creatinine, Ser: 1.22 mg/dL (ref 0.50–1.35)
GFR calc non Af Amer: 66 mL/min — ABNORMAL LOW (ref >90)
Glucose, Bld: 188 mg/dL — ABNORMAL HIGH (ref 70–99)
Potassium: 4.1 mEq/L (ref 3.5–5.1)

## 2012-09-10 NOTE — Progress Notes (Signed)
09/10/12 0923  OBSTRUCTIVE SLEEP APNEA  Have you ever been diagnosed with sleep apnea through a sleep study? No  Do you snore loudly (loud enough to be heard through closed doors)?  1  Do you often feel tired, fatigued, or sleepy during the daytime? 0  Has anyone observed you stop breathing during your sleep? 0  Do you have, or are you being treated for high blood pressure? 1  BMI more than 35 kg/m2? 1  Age over 53 years old? 1  Neck circumference greater than 40 cm/18 inches? 1  Gender: 1  Obstructive Sleep Apnea Score 6   Score 4 or greater  Results sent to PCP

## 2012-09-10 NOTE — Patient Instructions (Addendum)
20 Raymond Barrera  09/10/2012   Your procedure is scheduled on:  09/13/2012  Report to [name] at 8:50 AM.  Call this number if you have problems the morning of surgery: [phone]   Remember:   Do not drink or eat food:After Midnight.   .  Take these medicines the morning of surgery with A SIP OF WATER: percocet if needed   Do not wear jewelry, make-up or nail polish.  Do not wear lotions, powders, or perfumes. You may wear deodorant.  Do not shave 48 hours prior to surgery. Men may shave face and neck.  Do not bring valuables to the hospital.  Contacts, dentures or bridgework may not be worn into surgery.  Leave suitcase in the car. After surgery it may be brought to your room.  For patients admitted to the hospital, checkout time is 11:00 AM the day of discharge.   Patients discharged the day of surgery will not be allowed to drive home.  Name and phone number of your driver:   Special Instructions: Shower using CHG 2 nights before surgery and the night before surgery.  If you shower the day of surgery use CHG.  Use special wash - you have one bottle of CHG for all showers.  You should use approximately 1/3 of the bottle for each shower.   Please read over the following fact sheets that you were given: Pain Booklet, Coughing and Deep Breathing, Anesthesia Post-op Instructions and Care and Recovery After Surgery  PATIENT INSTRUCTIONS POST-ANESTHESIA  IMMEDIATELY FOLLOWING SURGERY:  Do not drive or operate machinery for the first twenty four hours after surgery.  Do not make any important decisions for twenty four hours after surgery or while taking narcotic pain medications or sedatives.  If you develop intractable nausea and vomiting or a severe headache please notify your doctor immediately.  FOLLOW-UP:  Please make an appointment with your surgeon as instructed. You do not need to follow up with anesthesia unless specifically instructed to do so.  WOUND CARE INSTRUCTIONS (if  applicable):  Keep a dry clean dressing on the anesthesia/puncture wound site if there is drainage.  Once the wound has quit draining you may leave it open to air.  Generally you should leave the bandage intact for twenty four hours unless there is drainage.  If the epidural site drains for more than 36-48 hours please call the anesthesia department.  QUESTIONS?:  Please feel free to call your physician or the hospital operator if you have any questions, and they will be happy to assist you.

## 2012-09-13 ENCOUNTER — Encounter (HOSPITAL_COMMUNITY): Payer: Self-pay | Admitting: Anesthesiology

## 2012-09-13 ENCOUNTER — Encounter (HOSPITAL_COMMUNITY)
Admission: RE | Disposition: A | Discharge status: Home / Self Care | Payer: Self-pay | Source: Ambulatory Visit | Attending: General Surgery

## 2012-09-13 ENCOUNTER — Ambulatory Visit (HOSPITAL_COMMUNITY): Payer: Self-pay | Admitting: Anesthesiology

## 2012-09-13 ENCOUNTER — Encounter (HOSPITAL_COMMUNITY): Payer: Self-pay | Admitting: *Deleted

## 2012-09-13 ENCOUNTER — Ambulatory Visit (HOSPITAL_COMMUNITY)
Admission: RE | Admit: 2012-09-13 | Discharge: 2012-09-13 | Disposition: A | Discharge status: Home / Self Care | Payer: Self-pay | Source: Ambulatory Visit | Attending: General Surgery | Admitting: General Surgery

## 2012-09-13 DIAGNOSIS — D1739 Benign lipomatous neoplasm of skin and subcutaneous tissue of other sites: Secondary | ICD-10-CM | POA: Insufficient documentation

## 2012-09-13 HISTORY — PX: MASS EXCISION: SHX2000

## 2012-09-13 SURGERY — EXCISION MASS
Anesthesia: General | Site: Back | Wound class: Clean

## 2012-09-13 MED ORDER — LIDOCAINE HCL (CARDIAC) 10 MG/ML IV SOLN
INTRAVENOUS | Status: DC | PRN
  Administered 2012-09-13: 50 mg via INTRAVENOUS

## 2012-09-13 MED ORDER — ONDANSETRON HCL 4 MG/2ML IJ SOLN
4.0000 mg | Freq: Once | INTRAMUSCULAR | Status: AC
  Administered 2012-09-13: 4 mg via INTRAVENOUS

## 2012-09-13 MED ORDER — PROPOFOL 10 MG/ML IV EMUL
INTRAVENOUS | Status: AC
  Filled 2012-09-13: qty 40

## 2012-09-13 MED ORDER — CEFAZOLIN SODIUM-DEXTROSE 2-3 GM-% IV SOLR
INTRAVENOUS | Status: DC | PRN
  Administered 2012-09-13: 3 g via INTRAVENOUS

## 2012-09-13 MED ORDER — BUPIVACAINE HCL (PF) 0.5 % IJ SOLN
INTRAMUSCULAR | Status: AC
  Filled 2012-09-13: qty 30

## 2012-09-13 MED ORDER — HYDROCODONE-ACETAMINOPHEN 5-325 MG PO TABS: 1.0000 | ORAL_TABLET | Freq: Four times a day (QID) | ORAL | Status: DC | PRN

## 2012-09-13 MED ORDER — MIDAZOLAM HCL 2 MG/2ML IJ SOLN
INTRAMUSCULAR | Status: AC
  Filled 2012-09-13: qty 2

## 2012-09-13 MED ORDER — KETOROLAC TROMETHAMINE 30 MG/ML IJ SOLN
30.0000 mg | Freq: Once | INTRAMUSCULAR | Status: AC
  Administered 2012-09-13: 30 mg via INTRAVENOUS

## 2012-09-13 MED ORDER — LACTATED RINGERS IV SOLN
INTRAVENOUS | Status: DC
  Administered 2012-09-13: 11:00:00 via INTRAVENOUS

## 2012-09-13 MED ORDER — PROPOFOL 10 MG/ML IV EMUL
INTRAVENOUS | Status: DC | PRN
  Administered 2012-09-13: 250 mg via INTRAVENOUS

## 2012-09-13 MED ORDER — ONDANSETRON HCL 4 MG/2ML IJ SOLN: 4.0000 mg | Freq: Once | INTRAMUSCULAR | Status: DC | PRN

## 2012-09-13 MED ORDER — CEFAZOLIN SODIUM 1 G IJ SOLR
INTRAMUSCULAR | Status: AC
  Filled 2012-09-13: qty 10

## 2012-09-13 MED ORDER — FENTANYL CITRATE 0.05 MG/ML IJ SOLN
INTRAMUSCULAR | Status: AC
  Filled 2012-09-13: qty 2

## 2012-09-13 MED ORDER — KETOROLAC TROMETHAMINE 30 MG/ML IJ SOLN
INTRAMUSCULAR | Status: AC
  Filled 2012-09-13: qty 1

## 2012-09-13 MED ORDER — LACTATED RINGERS IV SOLN
INTRAVENOUS | Status: DC | PRN
  Administered 2012-09-13: 11:00:00 via INTRAVENOUS

## 2012-09-13 MED ORDER — MIDAZOLAM HCL 2 MG/2ML IJ SOLN
1.0000 mg | INTRAMUSCULAR | Status: DC | PRN
  Administered 2012-09-13 (×2): 2 mg via INTRAVENOUS

## 2012-09-13 MED ORDER — LABETALOL HCL 5 MG/ML IV SOLN
INTRAVENOUS | Status: AC
  Filled 2012-09-13: qty 4

## 2012-09-13 MED ORDER — FENTANYL CITRATE 0.05 MG/ML IJ SOLN
INTRAMUSCULAR | Status: DC | PRN
  Administered 2012-09-13 (×2): 50 ug via INTRAVENOUS

## 2012-09-13 MED ORDER — BUPIVACAINE HCL 0.5 % IJ SOLN
INTRAMUSCULAR | Status: DC | PRN
  Administered 2012-09-13: 30 mL

## 2012-09-13 MED ORDER — LABETALOL HCL 5 MG/ML IV SOLN
10.0000 mg | INTRAVENOUS | Status: DC | PRN
  Administered 2012-09-13: 11:00:00 via INTRAVENOUS

## 2012-09-13 MED ORDER — CEFAZOLIN SODIUM-DEXTROSE 2-3 GM-% IV SOLR
INTRAVENOUS | Status: AC
  Filled 2012-09-13: qty 50

## 2012-09-13 MED ORDER — SODIUM CHLORIDE 0.9 % IR SOLN
Status: DC | PRN
  Administered 2012-09-13: 1000 mL

## 2012-09-13 MED ORDER — FENTANYL CITRATE 0.05 MG/ML IJ SOLN: 25.0000 ug | INTRAMUSCULAR | Status: DC | PRN

## 2012-09-13 MED ORDER — ONDANSETRON HCL 4 MG/2ML IJ SOLN
INTRAMUSCULAR | Status: AC
  Filled 2012-09-13: qty 2

## 2012-09-13 MED ORDER — LIDOCAINE HCL (PF) 1 % IJ SOLN
INTRAMUSCULAR | Status: AC
  Filled 2012-09-13: qty 5

## 2012-09-13 MED ORDER — CEFAZOLIN SODIUM-DEXTROSE 2-3 GM-% IV SOLR: 2.0000 g | INTRAVENOUS | Status: DC

## 2012-09-13 SURGICAL SUPPLY — 29 items
ADH SKN CLS APL DERMABOND .7 (GAUZE/BANDAGES/DRESSINGS) ×1
BAG HAMPER (MISCELLANEOUS) ×2 IMPLANT
CLOTH BEACON ORANGE TIMEOUT ST (SAFETY) ×2 IMPLANT
COVER LIGHT HANDLE STERIS (MISCELLANEOUS) ×4 IMPLANT
DECANTER SPIKE VIAL GLASS SM (MISCELLANEOUS) ×2 IMPLANT
DERMABOND ADVANCED (GAUZE/BANDAGES/DRESSINGS) ×1
DERMABOND ADVANCED .7 DNX12 (GAUZE/BANDAGES/DRESSINGS) IMPLANT
DURAPREP 26ML APPLICATOR (WOUND CARE) ×2 IMPLANT
ELECT NDL TIP 2.8 STRL (NEEDLE) IMPLANT
ELECT NEEDLE TIP 2.8 STRL (NEEDLE) IMPLANT
ELECT REM PT RETURN 9FT ADLT (ELECTROSURGICAL) ×2
ELECTRODE REM PT RTRN 9FT ADLT (ELECTROSURGICAL) ×1 IMPLANT
FORMALIN 10 PREFIL 120ML (MISCELLANEOUS) ×2 IMPLANT
GLOVE BIO SURGEON STRL SZ7.5 (GLOVE) ×2 IMPLANT
GOWN STRL REIN XL XLG (GOWN DISPOSABLE) ×6 IMPLANT
KIT ROOM TURNOVER APOR (KITS) ×2 IMPLANT
MANIFOLD NEPTUNE II (INSTRUMENTS) ×2 IMPLANT
NDL HYPO 25X1 1.5 SAFETY (NEEDLE) ×1 IMPLANT
NEEDLE HYPO 25X1 1.5 SAFETY (NEEDLE) ×2 IMPLANT
NS IRRIG 1000ML POUR BTL (IV SOLUTION) ×2 IMPLANT
PACK MINOR (CUSTOM PROCEDURE TRAY) IMPLANT
PAD ARMBOARD 7.5X6 YLW CONV (MISCELLANEOUS) ×2 IMPLANT
SET BASIN LINEN APH (SET/KITS/TRAYS/PACK) ×2 IMPLANT
SUT ETHILON 3 0 FSL (SUTURE) IMPLANT
SUT PROLENE 4 0 PS 2 18 (SUTURE) IMPLANT
SUT VIC AB 3-0 SH 27 (SUTURE) ×2
SUT VIC AB 3-0 SH 27X BRD (SUTURE) IMPLANT
SUT VIC AB 4-0 PS2 27 (SUTURE) ×1 IMPLANT
SYR CONTROL 10ML LL (SYRINGE) ×2 IMPLANT

## 2012-09-13 NOTE — Interval H&P Note (Signed)
History and Physical Interval Note:  09/13/2012 11:30 AM  Raymond Barrera  has presented today for surgery, with the diagnosis of Mass on trunk  The various methods of treatment have been discussed with the patient and family. After consideration of risks, benefits and other options for treatment, the patient has consented to  Procedure(s) (LRB) with comments: EXCISION MASS (N/A) as a surgical intervention .  The patient's history has been reviewed, patient examined, no change in status, stable for surgery.  I have reviewed the patient's chart and labs.  Questions were answered to the patient's satisfaction.     [name]

## 2012-09-13 NOTE — Anesthesia Procedure Notes (Signed)
Procedure Name: LMA Insertion Date/Time: 09/13/2012 12:02 PM Performed by: Carolyne Littles, AMY L Pre-anesthesia Checklist: Patient identified, Patient being monitored, Emergency Drugs available, Timeout performed and Suction available Patient Re-evaluated:Patient Re-evaluated prior to inductionOxygen Delivery Method: Circle system utilized Preoxygenation: Pre-oxygenation with 100% oxygen Intubation Type: IV induction Ventilation: Mask ventilation without difficulty LMA: LMA inserted LMA Size: 5.0 Number of attempts: 1 Placement Confirmation: positive ETCO2 and breath sounds checked- equal and bilateral Tube secured with: Tape Dental Injury: Teeth and Oropharynx as per pre-operative assessment

## 2012-09-13 NOTE — Anesthesia Preprocedure Evaluation (Signed)
Anesthesia Evaluation  Patient identified by MRN, date of birth, ID band Patient awake    Reviewed: Allergy & Precautions, H&P , NPO status , Patient's Chart, lab work & pertinent test results  History of Anesthesia Complications (+) AWARENESS UNDER ANESTHESIA  Airway Mallampati: I TM Distance: >3 FB     Dental  (+) Teeth Intact   Pulmonary Current Smoker (am cough),  breath sounds clear to auscultation        Cardiovascular hypertension, Pt. on medications + Past MI (denies CP now) Rhythm:Regular     Neuro/Psych    GI/Hepatic negative GI ROS,   Endo/Other    Renal/GU      Musculoskeletal   Abdominal   Peds  Hematology   Anesthesia Other Findings   Reproductive/Obstetrics                           Anesthesia Physical Anesthesia Plan  ASA: III  Anesthesia Plan: General   Post-op Pain Management:    Induction: Intravenous  Airway Management Planned: LMA  Additional Equipment:   Intra-op Plan:   Post-operative Plan: Extubation in OR  Informed Consent: I have reviewed the patients History and Physical, chart, labs and discussed the procedure including the risks, benefits and alternatives for the proposed anesthesia with the patient or authorized representative who has indicated his/her understanding and acceptance.     Plan Discussed with:   Anesthesia Plan Comments:         Anesthesia Quick Evaluation

## 2012-09-13 NOTE — Transfer of Care (Signed)
  Anesthesia Post-op Note  Patient: Raymond Barrera  Procedure(s) Performed: Procedure(s) (LRB) with comments: EXCISION MASS (N/A)  Patient Location: PACU  Anesthesia Type: General  Level of Consciousness: awake, alert , oriented and patient cooperative  Airway and Oxygen Therapy: Patient Spontanous Breathing and Patient connected to face mask oxygen  Post-op Pain: mild  Post-op Assessment: Post-op Vital signs reviewed, Patient's Cardiovascular Status Stable, Respiratory Function Stable, Patent Airway and No signs of Nausea or vomiting  Post-op Vital Signs: Reviewed and stable  Complications: No apparent anesthesia complications  

## 2012-09-13 NOTE — Progress Notes (Signed)
Spoke with pt and wife regarding high BP. Wife states she is trying to get pt in to see MD regarding BP. Gave wife name of several MDs in [place]. Instructions regarding low salt diet given to pt and wife. Verbalized understanding.

## 2012-09-13 NOTE — Anesthesia Postprocedure Evaluation (Signed)
  Anesthesia Post-op Note  Patient: Raymond Barrera  Procedure(s) Performed: Procedure(s) (LRB) with comments: EXCISION MASS (N/A)  Patient Location: PACU  Anesthesia Type: General  Level of Consciousness: awake, alert , oriented and patient cooperative  Airway and Oxygen Therapy: Patient Spontanous Breathing and Patient connected to face mask oxygen  Post-op Pain: mild  Post-op Assessment: Post-op Vital signs reviewed, Patient's Cardiovascular Status Stable, Respiratory Function Stable, Patent Airway and No signs of Nausea or vomiting  Post-op Vital Signs: Reviewed and stable  Complications: No apparent anesthesia complications

## 2012-09-13 NOTE — Op Note (Signed)
Patient:  Raymond Barrera  DOB:  1961-06-15  MRN:  [id number]   Preop Diagnosis:  Mass, back  Postop Diagnosis:  Same, lipoma  Procedure:  Excision of 4 cm mass, back  Surgeon:  Franky Macho, M.D.  Anes:  General Patient is a 51 year old black male who presents with an enlarging subcutaneous mass along the upper portion of the back. The risks and benefits of the procedure were fully explained to the patient, gave informed consent.  Indications:  Patient is a 51 year old black male who presents with an enlarging subcutaneous mass along the upper portion of the back. The risks and benefits of the procedure were fully explained to the patient, gave informed consent.  Procedure note:  The patient was placed in the right lateral decubitus position after general anesthesia was administered. The upper back was prepped and draped using usual sterile technique with DuraPrep. Surgical site confirmation was performed.  Just to the left of the upper midline back, a subcutaneous ovoid mass was noted. It was approximately 4 cm in greatest diameter. A transverse incision was made over this region. Dissection was taken down to the subcutaneous tissue. A lipomatous mass was found overlying the paraspinous muscle. The lipoma was removed in total without difficulty. Sent to pathology for further examination. Any bleeding was controlled using Bovie electrocautery. 0.5% Sensorcaine was instilled the surrounding wound. The subcutaneous layer was reapproximated using 3-0 Vicryl interrupted suture. The skin was closed using a 4-0 Vicryl subcuticular suture. Dermabond was applied.  All tape and needle counts were correct at the end of the procedure. The patient was awakened and transferred to PACU in stable condition.   Complications:  None  EBL:  Minimal  Specimen:  Lipoma, back

## 2012-09-13 NOTE — Preoperative (Signed)
Beta Blockers   Reason not to administer Beta Blockers:Not Applicable 

## 2012-09-16 ENCOUNTER — Encounter (HOSPITAL_COMMUNITY): Payer: Self-pay | Admitting: General Surgery

## 2012-10-05 ENCOUNTER — Emergency Department (HOSPITAL_COMMUNITY)
Admission: EM | Admit: 2012-10-05 | Discharge: 2012-10-05 | Disposition: A | Discharge status: Home / Self Care | Payer: Self-pay | Attending: Emergency Medicine | Admitting: Emergency Medicine

## 2012-10-05 ENCOUNTER — Encounter (HOSPITAL_COMMUNITY): Payer: Self-pay

## 2012-10-05 ENCOUNTER — Emergency Department (HOSPITAL_COMMUNITY): Payer: Self-pay

## 2012-10-05 DIAGNOSIS — F17200 Nicotine dependence, unspecified, uncomplicated: Secondary | ICD-10-CM | POA: Insufficient documentation

## 2012-10-05 DIAGNOSIS — E7800 Pure hypercholesterolemia, unspecified: Secondary | ICD-10-CM | POA: Insufficient documentation

## 2012-10-05 DIAGNOSIS — I252 Old myocardial infarction: Secondary | ICD-10-CM | POA: Insufficient documentation

## 2012-10-05 DIAGNOSIS — M542 Cervicalgia: Secondary | ICD-10-CM | POA: Insufficient documentation

## 2012-10-05 DIAGNOSIS — Z8673 Personal history of transient ischemic attack (TIA), and cerebral infarction without residual deficits: Secondary | ICD-10-CM | POA: Insufficient documentation

## 2012-10-05 DIAGNOSIS — I10 Essential (primary) hypertension: Secondary | ICD-10-CM | POA: Insufficient documentation

## 2012-10-05 HISTORY — DX: Cerebral infarction, unspecified: I63.9

## 2012-10-05 MED ORDER — HYDROCODONE-ACETAMINOPHEN 5-325 MG PO TABS
2.0000 | ORAL_TABLET | Freq: Once | ORAL | Status: AC
  Administered 2012-10-05: 2 via ORAL
  Filled 2012-10-05: qty 2

## 2012-10-05 MED ORDER — PREDNISONE 50 MG PO TABS: 50.0000 mg | ORAL_TABLET | Freq: Every day | ORAL | Status: DC

## 2012-10-05 MED ORDER — OXYCODONE-ACETAMINOPHEN 5-325 MG PO TABS: 1.0000 | ORAL_TABLET | Freq: Four times a day (QID) | ORAL | Status: DC | PRN
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
## 2012-10-05 NOTE — ED Notes (Signed)
MD at bedside. 

## 2012-10-05 NOTE — ED Notes (Signed)
Pt with neck pain since surgery on 10/14, was instructed to come here for evaluation per pt and wife

## 2012-10-05 NOTE — ED Notes (Signed)
Pt reports had surgery 4 weeks ago to remove tumor from neck.  Pt reports has had pain in neck since.  Pt says the tumor was fatty tissue, not cancer.

## 2012-12-01 DIAGNOSIS — G459 Transient cerebral ischemic attack, unspecified: Secondary | ICD-10-CM

## 2012-12-01 HISTORY — DX: Transient cerebral ischemic attack, unspecified: G45.9

## 2013-03-07 ENCOUNTER — Inpatient Hospital Stay (HOSPITAL_COMMUNITY)
Admission: EM | Admit: 2013-03-07 | Discharge: 2013-03-09 | DRG: 069 | Disposition: A | Discharge status: Home / Self Care | Payer: MEDICAID | Attending: Neurology | Admitting: Neurology

## 2013-03-07 ENCOUNTER — Emergency Department (HOSPITAL_COMMUNITY): Payer: Self-pay

## 2013-03-07 ENCOUNTER — Encounter (HOSPITAL_COMMUNITY): Payer: Self-pay | Admitting: *Deleted

## 2013-03-07 ENCOUNTER — Inpatient Hospital Stay (HOSPITAL_COMMUNITY): Payer: Self-pay

## 2013-03-07 DIAGNOSIS — I252 Old myocardial infarction: Secondary | ICD-10-CM

## 2013-03-07 DIAGNOSIS — Z79899 Other long term (current) drug therapy: Secondary | ICD-10-CM

## 2013-03-07 DIAGNOSIS — I639 Cerebral infarction, unspecified: Secondary | ICD-10-CM

## 2013-03-07 DIAGNOSIS — I10 Essential (primary) hypertension: Secondary | ICD-10-CM | POA: Diagnosis present

## 2013-03-07 DIAGNOSIS — F17200 Nicotine dependence, unspecified, uncomplicated: Secondary | ICD-10-CM | POA: Diagnosis present

## 2013-03-07 DIAGNOSIS — E7800 Pure hypercholesterolemia, unspecified: Secondary | ICD-10-CM | POA: Diagnosis present

## 2013-03-07 DIAGNOSIS — R29898 Other symptoms and signs involving the musculoskeletal system: Secondary | ICD-10-CM | POA: Diagnosis present

## 2013-03-07 DIAGNOSIS — E785 Hyperlipidemia, unspecified: Secondary | ICD-10-CM | POA: Diagnosis present

## 2013-03-07 DIAGNOSIS — F1410 Cocaine abuse, uncomplicated: Secondary | ICD-10-CM | POA: Diagnosis present

## 2013-03-07 DIAGNOSIS — G459 Transient cerebral ischemic attack, unspecified: Principal | ICD-10-CM | POA: Diagnosis present

## 2013-03-07 DIAGNOSIS — Z7982 Long term (current) use of aspirin: Secondary | ICD-10-CM

## 2013-03-07 LAB — APTT: aPTT: 35 s (ref 24–37)

## 2013-03-07 LAB — DIFFERENTIAL
Basophils Absolute: 0 10*3/uL (ref 0.0–0.1)
Basophils Relative: 0 % (ref 0–1)
Eosinophils Absolute: 0.1 10*3/uL (ref 0.0–0.7)
Eosinophils Relative: 1 % (ref 0–5)
Lymphocytes Relative: 20 % (ref 12–46)
Lymphs Abs: 1.9 K/uL (ref 0.7–4.0)
Monocytes Absolute: 0.4 10*3/uL (ref 0.1–1.0)
Monocytes Relative: 4 % (ref 3–12)
Neutro Abs: 7 K/uL (ref 1.7–7.7)
Neutrophils Relative %: 75 % (ref 43–77)

## 2013-03-07 LAB — POCT I-STAT, CHEM 8
BUN: 16 mg/dL (ref 6–23)
Calcium, Ion: 1.23 mmol/L (ref 1.12–1.23)
Chloride: 106 meq/L (ref 96–112)
Creatinine, Ser: 1.3 mg/dL (ref 0.50–1.35)
Glucose, Bld: 130 mg/dL — ABNORMAL HIGH (ref 70–99)
HCT: 47 % (ref 39.0–52.0)
Hemoglobin: 16 g/dL (ref 13.0–17.0)
Potassium: 3.8 mEq/L (ref 3.5–5.1)
Sodium: 142 meq/L (ref 135–145)
TCO2: 28 mmol/L (ref 0–100)

## 2013-03-07 LAB — GLUCOSE, CAPILLARY: Glucose-Capillary: 137 mg/dL — ABNORMAL HIGH (ref 70–99)

## 2013-03-07 LAB — URINALYSIS, ROUTINE W REFLEX MICROSCOPIC
Bilirubin Urine: NEGATIVE
Glucose, UA: NEGATIVE mg/dL
Hgb urine dipstick: NEGATIVE
Ketones, ur: NEGATIVE mg/dL
Leukocytes, UA: NEGATIVE
Nitrite: NEGATIVE
Protein, ur: 30 mg/dL — AB
Specific Gravity, Urine: 1.013 (ref 1.005–1.030)
Urobilinogen, UA: 1 mg/dL (ref 0.0–1.0)
pH: 7.5 (ref 5.0–8.0)

## 2013-03-07 LAB — RAPID URINE DRUG SCREEN, HOSP PERFORMED
Amphetamines: NOT DETECTED
Barbiturates: NOT DETECTED
Benzodiazepines: NOT DETECTED
Cocaine: POSITIVE — AB
Opiates: NOT DETECTED
Tetrahydrocannabinol: NOT DETECTED

## 2013-03-07 LAB — COMPREHENSIVE METABOLIC PANEL
ALT: 48 U/L (ref 0–53)
AST: 31 U/L (ref 0–37)
CO2: 26 mEq/L (ref 19–32)
Calcium: 10.1 mg/dL (ref 8.4–10.5)
Creatinine, Ser: 1.24 mg/dL (ref 0.50–1.35)
GFR calc Af Amer: 75 mL/min — ABNORMAL LOW (ref >90)
GFR calc non Af Amer: 64 mL/min — ABNORMAL LOW (ref >90)
Glucose, Bld: 127 mg/dL — ABNORMAL HIGH (ref 70–99)
Sodium: 141 mEq/L (ref 135–145)
Total Protein: 7.6 g/dL (ref 6.0–8.3)

## 2013-03-07 LAB — TROPONIN I: Troponin I: <0.3 ng/mL (ref <0.30)

## 2013-03-07 LAB — CBC
HCT: 43.8 % (ref 39.0–52.0)
Hemoglobin: 15.7 g/dL (ref 13.0–17.0)
MCH: 30.7 pg (ref 26.0–34.0)
MCHC: 35.8 g/dL (ref 30.0–36.0)
MCV: 85.5 fL (ref 78.0–100.0)
Platelets: 205 10*3/uL (ref 150–400)
RBC: 5.12 MIL/uL (ref 4.22–5.81)
RDW: 13.6 % (ref 11.5–15.5)
WBC: 9.3 10*3/uL (ref 4.0–10.5)

## 2013-03-07 LAB — POCT I-STAT TROPONIN I: Troponin i, poc: 0.01 ng/mL (ref 0.00–0.08)

## 2013-03-07 LAB — COMPREHENSIVE METABOLIC PANEL WITH GFR
Albumin: 3.8 g/dL (ref 3.5–5.2)
Alkaline Phosphatase: 91 U/L (ref 39–117)
BUN: 15 mg/dL (ref 6–23)
Chloride: 105 meq/L (ref 96–112)
Potassium: 4 meq/L (ref 3.5–5.1)
Total Bilirubin: 0.3 mg/dL (ref 0.3–1.2)

## 2013-03-07 LAB — URINE MICROSCOPIC-ADD ON

## 2013-03-07 LAB — PROTIME-INR
INR: 0.93 (ref 0.00–1.49)
Prothrombin Time: 12.4 seconds (ref 11.6–15.2)

## 2013-03-07 LAB — ETHANOL: Alcohol, Ethyl (B): <11 mg/dL (ref 0–11)

## 2013-03-07 MED ORDER — PANTOPRAZOLE SODIUM 40 MG IV SOLR
40.0000 mg | Freq: Every day | INTRAVENOUS | Status: DC
  Administered 2013-03-08: 40 mg via INTRAVENOUS
  Filled 2013-03-07 (×2): qty 40

## 2013-03-07 MED ORDER — LABETALOL HCL 5 MG/ML IV SOLN: 5.0000 mg | Freq: Once | INTRAVENOUS | Status: DC

## 2013-03-07 MED ORDER — LABETALOL HCL 5 MG/ML IV SOLN
10.0000 mg | Freq: Once | INTRAVENOUS | Status: AC
  Administered 2013-03-07: 10 mg via INTRAVENOUS

## 2013-03-07 MED ORDER — LABETALOL HCL 5 MG/ML IV SOLN: 10.0000 mg | INTRAVENOUS | Status: DC | PRN

## 2013-03-07 MED ORDER — LABETALOL HCL 5 MG/ML IV SOLN
INTRAVENOUS | Status: AC
  Administered 2013-03-07: 5 mg
  Filled 2013-03-07: qty 4

## 2013-03-07 MED ORDER — ONDANSETRON HCL 4 MG/2ML IJ SOLN: 4.0000 mg | Freq: Four times a day (QID) | INTRAMUSCULAR | Status: DC | PRN

## 2013-03-07 MED ORDER — LABETALOL HCL 5 MG/ML IV SOLN
INTRAVENOUS | Status: AC
  Filled 2013-03-07: qty 4

## 2013-03-07 MED ORDER — SODIUM CHLORIDE 0.9 % IV SOLN
INTRAVENOUS | Status: DC
  Administered 2013-03-07 – 2013-03-09 (×3): via INTRAVENOUS

## 2013-03-07 MED ORDER — NICARDIPINE HCL IN NACL 20-0.86 MG/200ML-% IV SOLN
5.0000 mg/h | INTRAVENOUS | Status: DC
  Administered 2013-03-07: 2.5 mg/h via INTRAVENOUS
  Filled 2013-03-07: qty 200

## 2013-03-07 MED ORDER — ACETAMINOPHEN 650 MG RE SUPP: 650.0000 mg | RECTAL | Status: DC | PRN

## 2013-03-07 MED ORDER — NICARDIPINE HCL IN NACL 20-0.86 MG/200ML-% IV SOLN: 5.0000 mg/h | INTRAVENOUS | Status: DC

## 2013-03-07 MED ORDER — ALTEPLASE (STROKE) FULL DOSE INFUSION
90.0000 mg | Freq: Once | INTRAVENOUS | Status: AC
  Administered 2013-03-07: 90 mg via INTRAVENOUS
  Filled 2013-03-07: qty 90

## 2013-03-07 MED ORDER — ACETAMINOPHEN 325 MG PO TABS: 650.0000 mg | ORAL_TABLET | ORAL | Status: DC | PRN

## 2013-03-07 NOTE — ED Notes (Signed)
Dr Anise Salvo in room with pt

## 2013-03-07 NOTE — ED Notes (Signed)
Pt report sistting in chair at home and at 1815 developing weakness and paralysis in his left arm and leg.  At arrival to hospital pt reported increased function in his left arm with some numbness and was unable to lift his left leg.  While at CT pt regained complete function of left arm, and had increased function in his left leg.  Upon returning to room, pt had regained full use of his left leg.

## 2013-03-07 NOTE — ED Notes (Signed)
Pt stated that he was having a return of his symptoms.  Pt having noted weakness in his left arm with no grip strength, could only raise his leg 1-2 inches and could not hold his leg leg above the bed, and left side facial drooping.  Neurologist made aware and RR called

## 2013-03-07 NOTE — H&P (Addendum)
Reason for Consult:Left sided weakness Referring Physician: Leander Rams  CC: Left sided weakness  History is obtained from: Patient, EMS  HPI: Raymond Barrera is a 52 y.o. male with a history of hypertension, high cholesterol, mild stroke who presents with acute onset left-sided weakness. This started about 6:15 PM, he been walking and sat down around 6 PM. En route, his symptoms began to improve dramatically. On arrival he still had an NIH of 5, predominantly left lower extremity weakness. Given this finding, TPA was ordered however by the time of arrived at his bedside his NIH had reduced to 1 with very mild leg weakness. Was decided in the setting cannot proceed with TPA given the rapid resolution of symptoms, and mild persistent deficit.   Around 9 PM, he had a recurrent episode which lasted for a couple of minutes. Then around 10:10 PM, he had acute worsening and this time the deficits were persistent.   LKW: 6 PM tpa given: yes    ROS: A 14 point ROS was performed and is negative except as noted in the HPI.  Past Medical History  Diagnosis Date  . Hypertension   . High cholesterol   . Myocardial infarction 1987    "slight one"  . Complication of anesthesia   . Stroke    Social History: Tob: Positive for smoking  Exam: Current vital signs: BP 140/84  Pulse 77  Temp(Src) 97.9 F (36.6 C) (Oral)  Resp 23  Ht 6\' 6"  (1.981 m)  Wt 121.2 kg (267 lb 3.2 oz)  BMI 30.88 kg/m2  SpO2 98% Vital signs in last 24 hours: Temp:  [97.9 F (36.6 C)] 97.9 F (36.6 C) (04/07 2001) Pulse Rate:  [69-88] 77 (04/07 2345) Resp:  [14-33] 23 (04/07 2345) BP: (139-200)/(84-151) 140/84 mmHg (04/07 2345) SpO2:  [97 %-100 %] 98 % (04/07 2345) Weight:  [121.2 kg (267 lb 3.2 oz)] 121.2 kg (267 lb 3.2 oz) (04/07 2345)  General: In bed, no apparent distress CV: Regular rate and rhythm Mental Status: Patient is awake, alert, oriented to person, place, month, year, and situation. Immediate and  remote memory are intact. Patient is able to give a clear and coherent history. No signs of aphasia or neglect Cranial Nerves: II: Visual Fields are full. Pupils are equal, round, and reactive to light.   III,IV, VI: EOMI without ptosis or diploplia.  V: Facial sensation is symmetric to temperature VII: Facial movement was initially symmetric, but then had a persistent droop after his worsening at 10:10 PM VIII: hearing is intact to voice X: Uvula elevates symmetrically XI: Shoulder shrug is symmetric. XII: tongue is midline without atrophy or fasciculations.  Motor: Tone is normal. Bulk is normal. 5/5 strength was present in bilateral arms and right leg on arrival. 5-/5 strength in left leg. Reexamination after worsening, he had essentially no movement of his left leg and 1/5 strength in left arm. Sensory: Sensation is symmetric to light touch and temperature in the arms and legs. Deep Tendon Reflexes: 2+ and symmetric in the biceps and patellae.  Plantars: Toes are downgoing bilaterally.  Cerebellar: FNF intact bilaterally initially Gait: Not assessed due to acute nature of evaluation and multiple medical monitors in ED setting.  I have reviewed labs in epic and the results pertinent to this consultation are: CMP - unremarkable.  CBC - unremarkable  I have reviewed the images obtained: CT head - normal.   Impression: 52 yo M with acute stuttering infarct that has become persistent. He  had his worsening prior to being outside the 4.5 hour window and therefore was treated with IV tPA. His BP intially responded to labetalol, but his pulse was getting low and he was required repeated dosing and therefore Cardene was started.   Recommendations: 1. HgbA1c, fasting lipid panel 2. MRI, MRA  of the brain without contrast 3. Frequent neuro checks and vital signs per tPA protocol.  4. Echocardiogram 5. Carotid dopplers 6. Prophylactic therapy-None, will need asa after 24 hours if no  bleed on CT 7. Risk factor modification 8. Telemetry monitoring 9. PT consult, OT consult, Speech consult 10. Cardene drip with BP goal < 185/110   This patient is critically ill and at significant risk of neurological worsening, death and care requires constant monitoring of vital signs, hemodynamics,respiratory and cardiac monitoring, neurological assessment, discussion with family, other specialists and medical decision making of high complexity. I spent 60 minutes of neurocritical care time  in the care of  this patient.  Ritta Slot, MD [facility] [phone]  If 7pm- 7am, please page neurology on call at [phone].  03/08/2013  1:55 AM

## 2013-03-07 NOTE — ED Notes (Signed)
Pt stated he was having another episode of weakness in his left arm while RN was in the room with pt.  Weakness was returned to normal in approximately one minute time

## 2013-03-07 NOTE — ED Notes (Signed)
Cardene restarted following BP increase to 171/126

## 2013-03-07 NOTE — Code Documentation (Signed)
Patient in normal state of health at 1800, when patient tried to get out of chair at 1815 he had left sided weakness. Code stroke called at 1937, patient arrived at 20, EDP exam at 70, stroke team arrived 1944, LSW at 36, patient arrived to CT at 1950, phlebotomist arrived at 30, CT read by Dr. Amada Jupiter at [phone], pharmacy notified to mix tPA at 1951, tPA delivered to bedside at 2003. Initial NIH 05, but with slight improvement. Will continue to monitor patient for possible tPA administration. Called by bedside RN at 2056 for left sided weakness and numbness, upon arrival at 2100 symptoms had lessened. At 2210 patient's symptoms again worsened to NIH 09, blood pressure was controlled with labetalol and tPA was initiated at 2220. Cardene drip was started for better blood pressure control. See tPA flowsheet for further details of vitals and neurological exam. Patient transferred to 3102

## 2013-03-07 NOTE — ED Notes (Signed)
Pt BP began dropping and cardene stopped at present

## 2013-03-07 NOTE — ED Notes (Signed)
Pt complain of symptoms returning.  Minimal left arm movement and no leg movement

## 2013-03-07 NOTE — ED Provider Notes (Signed)
History     CSN: [id number]  Arrival date & time 03/07/13  1944   First MD Initiated Contact with Patient 03/07/13 2000      Chief Complaint  Patient presents with  . Code Stroke    (Consider location/radiation/quality/duration/timing/severity/associated sxs/prior treatment) HPI Comments: Level 5 caveat due to acuity of condition.  Pt reports at 6:15 PM, was trying to stand up from chair and he fell due to weakness of left leg.  He reports speech difficulty, couldn't move left arm.  He ahd some kind of pain to right leg as well.  Had a prior episode similar but not as severe about 1 month ago, didn't go to the hospital.  This time, EMS called, EMS brought to ED called code stroke.  About 5 minutes prior to arrival, strength in left arm improved, speech improving, but still weak in left leg.  Pt has not been compliant with meds for past 7 months.    The history is provided by the patient and the EMS personnel.    Past Medical History  Diagnosis Date  . Hypertension   . High cholesterol   . Myocardial infarction 1987    "slight one"  . Complication of anesthesia   . Stroke     Past Surgical History  Procedure Laterality Date  . Ankle surgery  2008    left ankle-otif-[facility]  . Mass excision  09/13/2012    Procedure: EXCISION MASS;  Surgeon: Dalia Heading, MD;  Location: [facility];  Service: General;  Laterality: N/A;    History reviewed. No pertinent family history.  History  Substance Use Topics  . Smoking status: Current Every Day Smoker -- 0.50 packs/day for 30 years    Types: Cigarettes  . Smokeless tobacco: Not on file  . Alcohol Use: No      Review of Systems  Unable to perform ROS: Acuity of condition    Allergies  Review of patient's allergies indicates no known allergies.  Home Medications  No current outpatient prescriptions on file.  BP 181/123  Pulse 80  Temp(Src) 97.9 F (36.6 C) (Oral)  Resp 15  SpO2 100%  Physical Exam  Nursing note and  vitals reviewed. Constitutional: He is oriented to person, place, and time. He appears well-developed and well-nourished.  HENT:  Head: Normocephalic and atraumatic.  Eyes: EOM are normal.  Neck: Neck supple.  Cardiovascular: Normal rate and regular rhythm.   Pulmonary/Chest: Effort normal. No respiratory distress.  Abdominal: Soft. He exhibits no distension.  Neurological: He is alert and oriented to person, place, and time. He exhibits normal muscle tone.  1/5 strength to LLE, equal grips bilaterally, mildly slurred speech with no sig facial droop  Skin: Skin is warm.  Psychiatric: He has a normal mood and affect.    ED Course  Procedures (including critical care time)  Labs Reviewed  COMPREHENSIVE METABOLIC PANEL - Abnormal; Notable for the following:    Glucose, Bld 127 (*)    GFR calc non Af Amer 64 (*)    GFR calc Af Amer 75 (*)    All other components within normal limits  GLUCOSE, CAPILLARY - Abnormal; Notable for the following:    Glucose-Capillary 137 (*)    All other components within normal limits  POCT I-STAT, CHEM 8 - Abnormal; Notable for the following:    Glucose, Bld 130 (*)    All other components within normal limits  ETHANOL  PROTIME-INR  APTT  CBC  DIFFERENTIAL  TROPONIN I  URINE RAPID DRUG SCREEN (HOSP PERFORMED)  URINALYSIS, ROUTINE W REFLEX MICROSCOPIC  POCT I-STAT TROPONIN I   Ct Head Wo Contrast  03/07/2013  *RADIOLOGY REPORT*  Clinical Data: Left-sided weakness and facial droop.  CT HEAD WITHOUT CONTRAST  Technique:  Contiguous axial images were obtained from the base of the skull through the vertex without contrast.  Comparison: None.  Findings: The brain demonstrates no evidence of hemorrhage, infarction, edema, mass effect, extra-axial fluid collection, hydrocephalus or mass lesion.  The skull is unremarkable.  IMPRESSION: Normal head CT.  No evidence of acute infarction.   Original Report Authenticated By: Irish Lack, M.D.      1. Stroke      ra sat is 100% and I interpret to be normal  EKG at time 19:58, shows sinus rhythm at a rate of 81, normal axis, minimal voltage criteria for LVH. Borderline repoll abnormality noted. No significant change compared to EKG from 09/10/2012.  MDM   Patient with sudden onset of symptoms concerning for acute CVA. No significant headache. Some of his symptoms seem improved, but pressure still is elevated at 181/123. Dr. Petra Kuba with neuro hospitalist service was at bedside upon patient arrival by EMS. He accompanied the patient to CT scan her. I reviewed the CT scan myself and reviewed the radiologist's interpretation. Neurology is recommending TPA administration for acute stroke. Patient will be admitted to the neuro ICU.        Raymond Barrera. Oletta Lamas, MD 03/07/13 2116

## 2013-03-08 ENCOUNTER — Inpatient Hospital Stay (HOSPITAL_COMMUNITY): Payer: MEDICAID

## 2013-03-08 LAB — LIPID PANEL
Cholesterol: 220 mg/dL — ABNORMAL HIGH (ref 0–200)
LDL Cholesterol: 139 mg/dL — ABNORMAL HIGH (ref 0–99)
Triglycerides: 235 mg/dL — ABNORMAL HIGH (ref <150)

## 2013-03-08 LAB — HEMOGLOBIN A1C: Hgb A1c MFr Bld: 6.5 % — ABNORMAL HIGH (ref <5.7)

## 2013-03-08 MED ORDER — ASPIRIN 325 MG PO TABS
325.0000 mg | ORAL_TABLET | Freq: Every day | ORAL | Status: DC
  Administered 2013-03-08 – 2013-03-09 (×2): 325 mg via ORAL
  Filled 2013-03-08 (×2): qty 1

## 2013-03-08 MED ORDER — PNEUMOCOCCAL VAC POLYVALENT 25 MCG/0.5ML IJ INJ
0.5000 mL | INJECTION | INTRAMUSCULAR | Status: AC
  Administered 2013-03-09: 0.5 mL via INTRAMUSCULAR
  Filled 2013-03-08: qty 0.5

## 2013-03-08 MED ORDER — LORAZEPAM 2 MG/ML IJ SOLN
1.0000 mg | Freq: Once | INTRAMUSCULAR | Status: AC
  Administered 2013-03-08: 1 mg via INTRAVENOUS
  Filled 2013-03-08: qty 1

## 2013-03-08 NOTE — Evaluation (Signed)
Speech Language Pathology Evaluation Patient Details Name: Raymond Barrera MRN: [id number] DOB: Jul 30, 1961 Today's Date: 03/08/2013 Time: 4098-1191 SLP Time Calculation (min): 54 min  Problem List: There is no problem list on file for this patient.  Past Medical History:  Past Medical History  Diagnosis Date  . Hypertension   . High cholesterol   . Myocardial infarction 1987    "slight one"  . Complication of anesthesia   . Stroke    Past Surgical History:  Past Surgical History  Procedure Laterality Date  . Ankle surgery  2008    left ankle-otif-[facility]  . Mass excision  09/13/2012    Procedure: EXCISION MASS;  Surgeon: Dalia Heading, MD;  Location: [facility];  Service: General;  Laterality: N/A;   HPI:  See BSE   Assessment / Plan / Recommendation Clinical Impression  Pt speech/language and cognitive status appears to be at baseline    SLP Assessment  Patient does not need any further Speech Lanaguage Pathology Services    Follow Up Recommendations  None    Frequency and Duration        Pertinent Vitals/Pain No pain reported   SLP Evaluation Prior Functioning  Cognitive/Linguistic Baseline: Within functional limits   Cognition  Overall Cognitive Status: Appears within functional limits for tasks assessed Orientation Level: Oriented X4    Comprehension  Auditory Comprehension Overall Auditory Comprehension: Appears within functional limits for tasks assessed Visual Recognition/Discrimination Discrimination: Within Function Limits    Expression Expression Primary Mode of Expression: Verbal Verbal Expression Overall Verbal Expression: Appears within functional limits for tasks assessed Written Expression Dominant Hand: Right   Oral / Motor Oral Motor/Sensory Function Overall Oral Motor/Sensory Function: Appears within functional limits for tasks assessed Motor Speech Overall Motor Speech: Appears within functional limits for tasks assessed   Raymond B.  Murvin Barrera [facility], CCC-SLP [phone]  Leigh Aurora 03/08/2013, 10:07 AM

## 2013-03-08 NOTE — Progress Notes (Signed)
*  PRELIMINARY RESULTS* Vascular Ultrasound Carotid Duplex (Doppler) has been completed.  Preliminary findings: Bilateral:  No evidence of hemodynamically significant internal carotid artery stenosis.   Vertebral artery flow is antegrade.     Farrel Demark, RDMS, RVT  03/08/2013, 10:24 AM

## 2013-03-08 NOTE — Progress Notes (Signed)
Stroke Team Progress Note  HISTORY Raymond Barrera is a 52 y.o. male with a history of hypertension, high cholesterol, mild stroke who presents 03/07/2013 with acute onset left-sided weakness. This started about 6:15 PM, he been walking and sat down around 6 PM. En route, his symptoms began to improve dramatically. On arrival he still had an NIH of 5, predominantly left lower extremity weakness. Given this finding, TPA was ordered however by the time of arrived at his bedside his NIH had reduced to 1 with very mild leg weakness. Was decided in the setting cannot proceed with TPA given the rapid resolution of symptoms, and mild persistent deficit. Around 9 PM, he had a recurrent episode which lasted for a couple of minutes. Then around 10:10 PM, he had acute worsening and this time the deficits were persistent. Patient received TPA. He was admitted to the neuro ICU for further evaluation and treatment.  SUBJECTIVE His wife is at the bedside.  Overall he feels his condition is gradually improving, though he did wax and wane during the night. He really wants to eat - passed swallow in ED but kept NPO due to waxing and waning symptoms.  OBJECTIVE Most recent Vital Signs: Filed Vitals:   03/08/13 0630 03/08/13 0700 03/08/13 0730 03/08/13 0800  BP: 121/81 119/85 145/96 131/82  Pulse: 65 65 67 64  Temp:  97.4 F (36.3 C)    TempSrc:  Oral    Resp: 16 17 20 17   Height:      Weight:      SpO2: 96% 97% 97% 98%   CBG (last 3)   Recent Labs  03/07/13 2008  GLUCAP 137*   IV Fluid Intake:   . sodium chloride 75 mL/hr at 03/08/13 0800  . niCARDipine Stopped (03/08/13 0200)    MEDICATIONS  . labetalol  5 mg Intravenous Once  . pantoprazole (PROTONIX) IV  40 mg Intravenous QHS  . [START ON 03/09/2013] pneumococcal 23 valent vaccine  0.5 mL Intramuscular Tomorrow-1000   PRN:  acetaminophen, acetaminophen, labetalol, ondansetron (ZOFRAN) IV  Diet:  NPO  Activity:  Bedrest DVT Prophylaxis:  SCDs    CLINICALLY SIGNIFICANT STUDIES Basic Metabolic Panel:   Recent Labs Lab 03/07/13 1956 03/07/13 2003  NA 141 142  K 4.0 3.8  CL 105 106  CO2 26  --   GLUCOSE 127* 130*  BUN 15 16  CREATININE 1.24 1.30  CALCIUM 10.1  --    Liver Function Tests:   Recent Labs Lab 03/07/13 1956  AST 31  ALT 48  ALKPHOS 91  BILITOT 0.3  PROT 7.6  ALBUMIN 3.8   CBC:   Recent Labs Lab 03/07/13 1956 03/07/13 2003  WBC 9.3  --   NEUTROABS 7.0  --   HGB 15.7 16.0  HCT 43.8 47.0  MCV 85.5  --   PLT 205  --    Coagulation:   Recent Labs Lab 03/07/13 1956  LABPROT 12.4  INR 0.93   Cardiac Enzymes:   Recent Labs Lab 03/07/13 1956  TROPONINI <0.30   Urinalysis:   Recent Labs Lab 03/07/13 2255  COLORURINE YELLOW  LABSPEC 1.013  PHURINE 7.5  GLUCOSEU NEGATIVE  HGBUR NEGATIVE  BILIRUBINUR NEGATIVE  KETONESUR NEGATIVE  PROTEINUR 30*  UROBILINOGEN 1.0  NITRITE NEGATIVE  LEUKOCYTESUR NEGATIVE   Lipid Panel    Component Value Date/Time   CHOL 220* 03/08/2013 0420   TRIG 235* 03/08/2013 0420   HDL 34* 03/08/2013 0420   CHOLHDL 6.5 03/08/2013 0420  VLDL 47* 03/08/2013 0420   LDLCALC 139* 03/08/2013 0420   HgbA1C  No results found for this basename: HGBA1C    Urine Drug Screen:     Component Value Date/Time   LABOPIA NONE DETECTED 03/07/2013 2255   COCAINSCRNUR POSITIVE* 03/07/2013 2255   LABBENZ NONE DETECTED 03/07/2013 2255   AMPHETMU NONE DETECTED 03/07/2013 2255   THCU NONE DETECTED 03/07/2013 2255   LABBARB NONE DETECTED 03/07/2013 2255    Alcohol Level:   Recent Labs Lab 03/07/13 1956  ETH <11   CT of the brain  03/07/2013   Normal head CT.  No evidence of acute infarction.     MRI of the brain    MRA of the brain    2D Echocardiogram    Carotid Doppler    CXR  03/08/2013   Mild elevation of the left hemidiaphragm, and stable atelectasis or scarring at the left lung base.  Mild peribronchial thickening noted.  Lungs otherwise clear  EKG  normal sinus rhythm.    Therapy Recommendations   Physical Exam   Pleasant young african [place] male not in distress.Awake alert. Afebrile. Head is nontraumatic. Neck is supple without bruit. Hearing is normal. Cardiac exam no murmur or gallop. Lungs are clear to auscultation. Distal pulses are well felt. Neurological Exam ; Awake alert oriented x 3 normal speech and language. No face asymmetry. Tongue midline. No drift. Mild diminished fine finger movements on left. Orbits right over left upper extremity. Mild left grip weak.Mild left hip flexor weakness.Normal sensation . Normal coordination. NIHSS 1 ASSESSMENT Raymond Barrera is a 52 y.o. male presenting with left sided weakness. Status post IV t-PA 03/07/2013 at 2220. Suspect a right brain stroke, imaging pending.  On no antiplatelets prior to admission. Now on no antiplatelets as within 24h of tpa for secondary stroke prevention. Patient with resultant left sided weakness, dysarthria, dysphagia. Work up underway.  Accelerated hypertension, BP 198/132 in ED Hyperlipidemia, LDL 139, on statin PTA, not on statin now, goal LDL < 100 Hx MI Hx stroke Cigarette smoker Snores, likely OSA, no formal evaluation-will plan PSG after Discharge  Hospital day # 1  TREATMENT/PLAN  Add aspirin 325 mg orally every day for secondary stroke prevention after 2220 if post-tpa imaging negative for hemorrhage  F/u MRI, MRA, 2D, Carotid doppler. Ativan on call for mri due to claustrophobia per patient request.  ST check swallow. Diet per ST  Resume home meds if passes swallow  Keep in bed today. OOB Wed am with therapy evals at that time  Stop smoking  OP sleep study  Strict BP control as per post TPA protocol.  Annie Main, MSN, RN, ANVP-BC, ANP-BC, GNP-BC [facility] Pager: [phone] 03/08/2013 8:45 AM This patient is critically ill and at significant risk of neurological worsening, death and care requires constant monitoring of vital signs,  hemodynamics,respiratory and cardiac monitoring,review of multiple databases, neurological assessment, discussion with family, other specialists and medical decision making of high complexity. I spent 30 minutes of neurocritical care time  in the care of  this patient. I have personally obtained a history, examined the patient, evaluated imaging results, and formulated the assessment and plan of care. I agree with the above. Delia Heady, MD

## 2013-03-08 NOTE — Progress Notes (Signed)
OT / PT Cancellation Note  Patient Details Name: Raymond Barrera MRN: [id number] DOB: 11-18-1961   Cancelled Treatment:    Reason Eval/Treat Not Completed: Patient not medically ready (Strict bedrest s/p TPA )  Harrel Carina [facility] 03/08/2013, 8:28 AM

## 2013-03-08 NOTE — Evaluation (Signed)
Clinical/Bedside Swallow Evaluation Patient Details  Name: Raymond Barrera MRN: [id number] Date of Birth: 05/06/1961  Today's Date: 03/08/2013 Time: 0900-0954 SLP Time Calculation (min): 54 min  Past Medical History:  Past Medical History  Diagnosis Date  . Hypertension   . High cholesterol   . Myocardial infarction 1987    "slight one"  . Complication of anesthesia   . Stroke    Past Surgical History:  Past Surgical History  Procedure Laterality Date  . Ankle surgery  2008    left ankle-otif-[facility]  . Mass excision  09/13/2012    Procedure: EXCISION MASS;  Surgeon: Dalia Heading, MD;  Location: [facility];  Service: General;  Laterality: N/A;   HPI:  52 year old male admitted 03/07/13 due to waxing/waning left sided weakness. CT negative for acute infarct.  Pt passed RN stroke swallow screen in ED, however, symptoms were variable. Pt kept NPO until ST evaluation of swallow safety.   Assessment / Plan / Recommendation Clinical Impression  Oral motor assessment WFL. No overt s/s aspiration observed or reported. Will begin regular diet with thin liquids.    Aspiration Risk  Mild    Diet Recommendation Regular;Thin liquid   Liquid Administration via: Straw;Cup Medication Administration: Whole meds with liquid Supervision: Patient able to self feed Compensations: Slow rate;Small sips/bites (stop eating if symptoms return) Postural Changes and/or Swallow Maneuvers: Seated upright 90 degrees    Other  Recommendations Oral Care Recommendations: Oral care BID;Patient independent with oral care   Follow Up Recommendations  None    Frequency and Duration        Pertinent Vitals/Pain No pain reported    Swallow Study Prior Functional Status   Tolerated regular diet at home    General Date of Onset: 03/07/13 HPI: 52 year old male admitted 03/07/13 due to waxing/waning left sided weakness. CT negative for acute infarct.  Pt passed RN stroke swallow screen in ED, however, symptoms  were variable. Pt kept NPO until ST evaluation of swallow safety. Type of Study: Bedside swallow evaluation Diet Prior to this Study: NPO Temperature Spikes Noted: No Respiratory Status: Room air History of Recent Intubation: No Behavior/Cognition: Alert;Pleasant mood;Cooperative Oral Cavity - Dentition: Adequate natural dentition Self-Feeding Abilities: Able to feed self Patient Positioning: Upright in bed Baseline Vocal Quality: Clear Volitional Cough: Strong Volitional Swallow: Able to elicit    Oral/Motor/Sensory Function Overall Oral Motor/Sensory Function: Appears within functional limits for tasks assessed   Ice Chips Ice chips: Within functional limits   Thin Liquid Thin Liquid: Within functional limits Presentation: Spoon;Straw;Self Fed    Nectar Thick Nectar Thick Liquid: Not tested   Honey Thick Honey Thick Liquid: Not tested   Puree Puree: Within functional limits Presentation: Self Fed;Spoon   Solid  Celia B. Murvin Natal [facility], CCC-SLP [phone] Solid: Within functional limits Presentation: Self Fed       Murvin Natal, Ruffin Pyo 03/08/2013,9:59 AM

## 2013-03-08 NOTE — Progress Notes (Signed)
As per OT note pt not yet ready for therapy.  Will check on pt tomorrow. [place], [place] [phone] 03/08/2013

## 2013-03-08 NOTE — Progress Notes (Signed)
  Echocardiogram 2D Echocardiogram has been performed.  Ellender Hose A 03/08/2013, 3:05 PM

## 2013-03-09 DIAGNOSIS — E785 Hyperlipidemia, unspecified: Secondary | ICD-10-CM | POA: Diagnosis present

## 2013-03-09 DIAGNOSIS — F17200 Nicotine dependence, unspecified, uncomplicated: Secondary | ICD-10-CM | POA: Diagnosis present

## 2013-03-09 DIAGNOSIS — I10 Essential (primary) hypertension: Secondary | ICD-10-CM | POA: Diagnosis present

## 2013-03-09 DIAGNOSIS — F1410 Cocaine abuse, uncomplicated: Secondary | ICD-10-CM | POA: Diagnosis present

## 2013-03-09 DIAGNOSIS — G459 Transient cerebral ischemic attack, unspecified: Secondary | ICD-10-CM | POA: Diagnosis present

## 2013-03-09 DIAGNOSIS — I639 Cerebral infarction, unspecified: Secondary | ICD-10-CM | POA: Diagnosis present

## 2013-03-09 DIAGNOSIS — I6350 Cerebral infarction due to unspecified occlusion or stenosis of unspecified cerebral artery: Secondary | ICD-10-CM

## 2013-03-09 MED ORDER — HYDROCHLOROTHIAZIDE 25 MG PO TABS
25.0000 mg | ORAL_TABLET | Freq: Every day | ORAL | Status: DC
Start: 2013-03-09 — End: 2013-03-09
  Administered 2013-03-09: 25 mg via ORAL
  Filled 2013-03-09: qty 1

## 2013-03-09 MED ORDER — PANTOPRAZOLE SODIUM 40 MG PO TBEC: 40.0000 mg | DELAYED_RELEASE_TABLET | Freq: Every day | ORAL | Status: DC

## 2013-03-09 MED ORDER — HYDROCHLOROTHIAZIDE 25 MG PO TABS: 25.0000 mg | ORAL_TABLET | Freq: Every day | ORAL | Status: DC

## 2013-03-09 MED ORDER — ASPIRIN 325 MG PO TABS: 325.0000 mg | ORAL_TABLET | Freq: Every day | ORAL | Status: DC

## 2013-03-09 MED ORDER — SIMVASTATIN 20 MG PO TABS: 20.0000 mg | ORAL_TABLET | Freq: Every day | ORAL | Status: DC

## 2013-03-09 MED ORDER — SIMVASTATIN 20 MG PO TABS
20.0000 mg | ORAL_TABLET | Freq: Every day | ORAL | Status: DC
  Filled 2013-03-09: qty 1

## 2013-03-09 NOTE — Discharge Summary (Signed)
Stroke Discharge Summary  Patient ID: Raymond Barrera   MRN: [id number]      DOB: [birth year]  Date of Admission: 03/07/2013 Date of Discharge: 03/09/2013  Attending Physician:  Darcella Cheshire, MD, Stroke MD  Consulting Physician(s):   Treatment Team:  Md Stroke, MD   Patient's PCP:  Default, Provider, MD  Discharge Diagnoses:  Active Problems:   TIA (transient ischemic attack)   Cocaine abuse   Accelerated hypertension   Other and unspecified hyperlipidemia   Current smoker  BMI: Body mass index is 30.88 kg/(m^2).  Past Medical History  Diagnosis Date  . Hypertension   . High cholesterol   . Myocardial infarction 1987    "slight one"  . Complication of anesthesia   . Stroke    Past Surgical History  Procedure Laterality Date  . Ankle surgery  2008    left ankle-otif-[facility]  . Mass excision  09/13/2012    Procedure: EXCISION MASS;  Surgeon: Dalia Heading, MD;  Location: [facility];  Service: General;  Laterality: N/A;      Medication List    TAKE these medications       aspirin 325 MG tablet  Take 1 tablet (325 mg total) by mouth daily.     hydrochlorothiazide 25 MG tablet  Commonly known as:  HYDRODIURIL  Take 1 tablet (25 mg total) by mouth daily.     simvastatin 20 MG tablet  Commonly known as:  ZOCOR  Take 1 tablet (20 mg total) by mouth daily at 6 PM.        LABORATORY STUDIES CBC    Component Value Date/Time   WBC 9.3 03/07/2013 1956   RBC 5.12 03/07/2013 1956   HGB 16.0 03/07/2013 2003   HCT 47.0 03/07/2013 2003   PLT 205 03/07/2013 1956   MCV 85.5 03/07/2013 1956   MCH 30.7 03/07/2013 1956   MCHC 35.8 03/07/2013 1956   RDW 13.6 03/07/2013 1956   LYMPHSABS 1.9 03/07/2013 1956   MONOABS 0.4 03/07/2013 1956   EOSABS 0.1 03/07/2013 1956   BASOSABS 0.0 03/07/2013 1956   CMP    Component Value Date/Time   NA 142 03/07/2013 2003   K 3.8 03/07/2013 2003   CL 106 03/07/2013 2003   CO2 26 03/07/2013 1956   GLUCOSE 130* 03/07/2013 2003   BUN 16 03/07/2013 2003   CREATININE  1.30 03/07/2013 2003   CALCIUM 10.1 03/07/2013 1956   PROT 7.6 03/07/2013 1956   ALBUMIN 3.8 03/07/2013 1956   AST 31 03/07/2013 1956   ALT 48 03/07/2013 1956   ALKPHOS 91 03/07/2013 1956   BILITOT 0.3 03/07/2013 1956   GFRNONAA 64* 03/07/2013 1956   GFRAA 75* 03/07/2013 1956   COAGS Lab Results  Component Value Date   INR 0.93 03/07/2013   Lipid Panel    Component Value Date/Time   CHOL 220* 03/08/2013 0420   TRIG 235* 03/08/2013 0420   HDL 34* 03/08/2013 0420   CHOLHDL 6.5 03/08/2013 0420   VLDL 47* 03/08/2013 0420   LDLCALC 139* 03/08/2013 0420   HgbA1C  Lab Results  Component Value Date   HGBA1C 6.5* 03/08/2013   Cardiac Panel (last 3 results)  Recent Labs  03/07/13 1956  TROPONINI <0.30   Urinalysis    Component Value Date/Time   COLORURINE YELLOW 03/07/2013 2255   APPEARANCEUR TURBID* 03/07/2013 2255   LABSPEC 1.013 03/07/2013 2255   PHURINE 7.5 03/07/2013 2255   GLUCOSEU NEGATIVE 03/07/2013 2255   HGBUR  NEGATIVE 03/07/2013 2255   BILIRUBINUR NEGATIVE 03/07/2013 2255   KETONESUR NEGATIVE 03/07/2013 2255   PROTEINUR 30* 03/07/2013 2255   UROBILINOGEN 1.0 03/07/2013 2255   NITRITE NEGATIVE 03/07/2013 2255   LEUKOCYTESUR NEGATIVE 03/07/2013 2255   Urine Drug Screen    Component Value Date/Time   LABOPIA NONE DETECTED 03/07/2013 2255   COCAINSCRNUR POSITIVE* 03/07/2013 2255   LABBENZ NONE DETECTED 03/07/2013 2255   AMPHETMU NONE DETECTED 03/07/2013 2255   THCU NONE DETECTED 03/07/2013 2255   LABBARB NONE DETECTED 03/07/2013 2255    Alcohol Level    Component Value Date/Time   ETH <11 03/07/2013 1956    SIGNIFICANT DIAGNOSTIC STUDIES CT of the brain  03/08/2013 1. Stable white matter disease. 2. No acute intracranial abnormality or significant interval change.  03/07/2013 Normal head CT. No evidence of acute infarction.  MRI of the brain  03/08/2013 Mild motion degradation. No acute infarct. Remote infarct posterior right corona radiata/posterior right lenticular nucleus. Remote small posterior left lenticular nucleus  infarct. Prominent white matter type changes most likely related to result of small vessel disease. Pineal cyst often an incidental finding. If the patient develops diplopia with upward gaze indicating growth then follow-up imaging at such time with attention to this region is recommended.  MRA of the brain 03/08/2013 Exam limited by motion. Intracranial atherosclerotic type changes suspected  2D Echocardiogram EF 45-50% with no source of embolus. Diffuse hypokinesis.  Carotid Doppler No evidence of hemodynamically significant internal carotid artery stenosis. Vertebral artery flow is antegrade.  CXR 03/08/2013 Mild elevation of the left hemidiaphragm, and stable atelectasis or scarring at the left lung base. Mild peribronchial thickening noted. Lungs otherwise clear  EKG normal sinus rhythm.     History of Present Illness  Raymond Barrera is a 52 y.o. male with a history of hypertension, high cholesterol, mild stroke who presents 03/07/2013 with acute onset left-sided weakness. This started about 6:15 PM, he been walking and sat down around 6 PM. En route, his symptoms began to improve dramatically. On arrival he still had an NIH of 5, predominantly left lower extremity weakness. Given this finding, TPA was ordered however by the time of arrived at his bedside his NIH had reduced to 1 with very mild leg weakness. Was decided in the setting cannot proceed with TPA given the rapid resolution of symptoms, and mild persistent deficit. Around 9 PM, he had a recurrent episode which lasted for a couple of minutes. Then around 10:10 PM, he had acute worsening and this time the deficits were persistent. Patient received TPA. He was admitted to the neuro ICU for further evaluation and treatment.  Hospital Course  MRI reveals no acute stroke. Dx: Crescendo TIAs. Patient was found to be cocaine positive - use of cocaine may have led to vasospasm as etiology of TIA. He was advised to stop using cocaine. He was on no  antiplatelets prior to admission. Placed on aspirin 325 mg orally every day for secondary stroke prevention.   Patient also had accelerated hypertension, BP 198/132 in ED. He was initially plkaced on cardene drip. This was weaned and HCTZ 25 mg dialy was added for BP dontrol. He was also found to have hyperlipidemia with LDL 139, not on statin prior to admission. He was placed on zocoe 20mg  daily. Goal LDL < 100. As pt is also a cigarette smoker, we was advised to stop. Wife reports snoring, likely OSA, has had no formal evaluation-will plan sleep study after discharge.  Patient with initial resultant left sided weakness, dysarthria, dysphagia, which all cleared. No continued stroke symptoms.  Physical therapy, occupational therapy and speech therapy evaluated patient. They recommend no follow up therapy.  Discharge Exam  Blood pressure 146/127, pulse 84, temperature 97.7 F (36.5 C), temperature source Oral, resp. rate 18, height 6\' 6"  (1.981 m), weight 121.2 kg (267 lb 3.2 oz), SpO2 98.00%. Pleasant young african [place] male not in distress.Awake alert. Afebrile. Head is nontraumatic. Neck is supple without bruit. Hearing is normal. Cardiac exam no murmur or gallop. Lungs are clear to auscultation. Distal pulses are well felt.  Neurological Exam ; Awake alert oriented x 3 normal speech and language. No face asymmetry. Tongue midline. No drift. Mild diminished fine finger movements on left. Orbits right over left upper extremity. Mild left grip weak.Mild left hip flexor weakness.Normal sensation . Normal coordination.   Discharge Diet   Cardiac thin liquids  Discharge Plan    Disposition:  Home with wife   aspirin 325 mg orally every day for secondary stroke prevention.  Ongoing risk factor control by Primary Care Physician. Risk factor recommendations:  Hypertension target range 130-140/70-80 Lipid range - LDL < 100 and checked every 6 months, fasting Smoking cessation Stop using cocaine   Outpatient sleep study. Dr. Pearlean Brownie will arrange.    Follow-up Default, Provider, MD in 1 month.  Follow-up with Dr. Delia Heady in 2 months.  25 minutes were spent preparing discharge.  Signed Annie Main, AVNP, ANP-BC, [facility] Nurse Practitioner 03/09/2013, 12:21 PM  I have personally examined this patient, reviewed pertinent data and developed the plan of care. I agree with above.  Delia Heady, MD

## 2013-03-09 NOTE — Progress Notes (Signed)
Stroke Team Progress Note  HISTORY ERCEL PEPITONE is a 52 y.o. male with a history of hypertension, high cholesterol, mild stroke who presents 03/07/2013 with acute onset left-sided weakness. This started about 6:15 PM, he been walking and sat down around 6 PM. En route, his symptoms began to improve dramatically. On arrival he still had an NIH of 5, predominantly left lower extremity weakness. Given this finding, TPA was ordered however by the time of arrived at his bedside his NIH had reduced to 1 with very mild leg weakness. Was decided in the setting cannot proceed with TPA given the rapid resolution of symptoms, and mild persistent deficit. Around 9 PM, he had a recurrent episode which lasted for a couple of minutes. Then around 10:10 PM, he had acute worsening and this time the deficits were persistent. Patient received TPA. He was admitted to the neuro ICU for further evaluation and treatment.  SUBJECTIVE His wife and therapists are at the bedside. He reports no additional neuro symptoms since early morning yesterday.much improved now and no deficits.  OBJECTIVE Most recent Vital Signs: Filed Vitals:   03/09/13 0500 03/09/13 0600 03/09/13 0700 03/09/13 0725  BP: 175/104 152/99 166/109   Pulse: 80 78 74   Temp:    97.7 F (36.5 C)  TempSrc:    Oral  Resp: 21 20 20    Height:      Weight:      SpO2: 94% 95% 96%    CBG (last 3)   Recent Labs  03/07/13 2008  GLUCAP 137*   IV Fluid Intake:   . sodium chloride 75 mL/hr at 03/09/13 0700  . niCARDipine Stopped (03/08/13 0200)    MEDICATIONS  . aspirin  325 mg Oral Daily  . labetalol  5 mg Intravenous Once  . pantoprazole (PROTONIX) IV  40 mg Intravenous QHS  . pneumococcal 23 valent vaccine  0.5 mL Intramuscular Tomorrow-1000   PRN:  acetaminophen, acetaminophen, labetalol, ondansetron (ZOFRAN) IV  Diet:  Cardiac thin liquids Activity:  Bedrest DVT Prophylaxis:  SCDs   CLINICALLY SIGNIFICANT STUDIES Basic Metabolic Panel:    Recent Labs Lab 03/07/13 1956 03/07/13 2003  NA 141 142  K 4.0 3.8  CL 105 106  CO2 26  --   GLUCOSE 127* 130*  BUN 15 16  CREATININE 1.24 1.30  CALCIUM 10.1  --    Liver Function Tests:   Recent Labs Lab 03/07/13 1956  AST 31  ALT 48  ALKPHOS 91  BILITOT 0.3  PROT 7.6  ALBUMIN 3.8   CBC:   Recent Labs Lab 03/07/13 1956 03/07/13 2003  WBC 9.3  --   NEUTROABS 7.0  --   HGB 15.7 16.0  HCT 43.8 47.0  MCV 85.5  --   PLT 205  --    Coagulation:   Recent Labs Lab 03/07/13 1956  LABPROT 12.4  INR 0.93   Cardiac Enzymes:   Recent Labs Lab 03/07/13 1956  TROPONINI <0.30   Urinalysis:   Recent Labs Lab 03/07/13 2255  COLORURINE YELLOW  LABSPEC 1.013  PHURINE 7.5  GLUCOSEU NEGATIVE  HGBUR NEGATIVE  BILIRUBINUR NEGATIVE  KETONESUR NEGATIVE  PROTEINUR 30*  UROBILINOGEN 1.0  NITRITE NEGATIVE  LEUKOCYTESUR NEGATIVE   Lipid Panel    Component Value Date/Time   CHOL 220* 03/08/2013 0420   TRIG 235* 03/08/2013 0420   HDL 34* 03/08/2013 0420   CHOLHDL 6.5 03/08/2013 0420   VLDL 47* 03/08/2013 0420   LDLCALC 139* 03/08/2013 0420  HgbA1C  Lab Results  Component Value Date   HGBA1C 6.5* 03/08/2013    Urine Drug Screen:     Component Value Date/Time   LABOPIA NONE DETECTED 03/07/2013 2255   COCAINSCRNUR POSITIVE* 03/07/2013 2255   LABBENZ NONE DETECTED 03/07/2013 2255   AMPHETMU NONE DETECTED 03/07/2013 2255   THCU NONE DETECTED 03/07/2013 2255   LABBARB NONE DETECTED 03/07/2013 2255    Alcohol Level:   Recent Labs Lab 03/07/13 1956  ETH <11   CT of the brain   03/08/2013  1.  Stable white matter disease. 2.  No acute intracranial abnormality or significant interval change.   03/07/2013   Normal head CT.  No evidence of acute infarction.     MRI of the brain   03/08/2013  Mild motion degradation.  No acute infarct.  Remote infarct posterior right corona radiata/posterior right lenticular nucleus.  Remote small posterior left lenticular nucleus infarct.   Prominent white matter type changes most likely related to result of small vessel disease.  Pineal cyst often an incidental finding.  If the patient develops diplopia with upward gaze indicating growth then follow-up imaging at such time with attention to this region is recommended.    MRA of the brain  03/08/2013   Exam limited by motion.  Intracranial atherosclerotic type changes suspected  2D Echocardiogram  EF 45-50% with no source of embolus. Diffuse hypokinesis.  Carotid Doppler  No evidence of hemodynamically significant internal carotid artery stenosis. Vertebral artery flow is antegrade.   CXR  03/08/2013   Mild elevation of the left hemidiaphragm, and stable atelectasis or scarring at the left lung base.  Mild peribronchial thickening noted.  Lungs otherwise clear  EKG  normal sinus rhythm.   Therapy Recommendations   Physical Exam   Pleasant young african [place] male not in distress.Awake alert. Afebrile. Head is nontraumatic. Neck is supple without bruit. Hearing is normal. Cardiac exam no murmur or gallop. Lungs are clear to auscultation. Distal pulses are well felt. Neurological Exam ; Awake alert oriented x 3 normal speech and language. No face asymmetry. Tongue midline. No drift. Mild diminished fine finger movements on left. Orbits right over left upper extremity.no grip weaknessNo hip flexor weakness.Normal sensation . Normal coordination. NIHSS 0  ASSESSMENT Mr. BUNYAN BRIER is a 52 y.o. male presenting with left sided weakness. Status post IV t-PA 03/07/2013 at 2220. MRI  reveals no acute stroke. Dx:  Crescendo TIA. On no antiplatelets prior to admission. Now on aspirin 325 mg orally every day for secondary stroke prevention. Patient with resultant left sided weakness, dysarthria, dysphagia. Work up underway.  Cocaine positive Accelerated hypertension, BP 198/132 in ED. Remains elevated in hospital Hyperlipidemia, LDL 139, not on statin now, goal LDL < 100 HgbA1c 6.5 Hx  MI Hx stroke Cigarette smoker Snores, likely OSA, no formal evaluation-will plan PSG after Discharge  Hospital day # 2  TREATMENT/PLAN  Continue aspirin 325 mg orally every day for secondary stroke prevention   OOB with therapy evals. Anticipate no discharge needs  Stop smoking  Add antihypertensive. Add statin.  Dr. Pearlean Brownie counseled patient to stop cocaine use  OP sleep study  Discharge home if no therapy needs.  Annie Main, MSN, RN, ANVP-BC, ANP-BC, [facility] Pager: [phone] 03/09/2013 8:13 AM  I have personally obtained a history, examined the patient, evaluated imaging results, and formulated the assessment and plan of care. I agree with the above.   Delia Heady, MD

## 2013-03-09 NOTE — Evaluation (Signed)
Physical Therapy Evaluation Patient Details Name: Raymond Barrera MRN: [id number] DOB: Jan 18, 1961 Today's Date: 03/09/2013 Time: 4098-1191 PT Time Calculation (min): 37 min  PT Assessment / Plan / Recommendation Clinical Impression  Patient is a 52 y/o male admitted with acute onset left sided weakness initially resolved, then resumed and TPA was administered.  Pt. presents without signs or symptoms of residual deficits in strength, coordination, balance and gait.  No further therapy recommended at this time.  Did educate in stroke warning signs and symptoms.    PT Assessment  Patent does not need any further PT services    Follow Up Recommendations  No PT follow up          Equipment Recommendations  None recommended by PT          Precautions / Restrictions Precautions Precautions: None   Pertinent Vitals/Pain Denies pain; BP elevated 153/105 ( MD stated okay to proceed)      Mobility  Bed Mobility Bed Mobility: Supine to Sit;Sitting - Scoot to Edge of Bed Supine to Sit: 7: Independent;HOB flat Sitting - Scoot to Edge of Bed: 7: Independent Transfers Transfers: Sit to Stand;Stand to Sit Sit to Stand: 6: Modified independent (Device/Increase time);From bed Stand to Sit: 6: Modified independent (Device/Increase time);To chair/3-in-1 Ambulation/Gait Ambulation/Gait Assistance: 7: Independent Ambulation Distance (Feet): 250 Feet Assistive device: None Gait Pattern: Within Functional Limits Gait velocity: 3.31 ft/sec    Exercises Other Exercises Other Exercises: Patient and wife verbally educated in stroke warning signs and symptoms.  Discussed options for fitness and need to purchase BP cuff for home.  Wife and pt understand.         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: No difficulties Dominant Hand: Right    Cognition  Cognition Overall Cognitive Status: Appears within functional limits for tasks assessed/performed Arousal/Alertness: Awake/alert Orientation Level: Appears intact for tasks assessed Behavior During Session: [facility] for tasks performed    Extremity/Trunk Assessment Right Lower Extremity Assessment RLE ROM/Strength/Tone: Within functional levels RLE Sensation: WFL - Light Touch RLE Coordination: WFL - gross/fine motor Left Lower Extremity Assessment LLE ROM/Strength/Tone: Within functional levels LLE Sensation: WFL - Light Touch LLE Coordination: WFL - gross/fine motor   Balance Standardized Balance Assessment Standardized Balance Assessment: Dynamic Gait Index Dynamic Gait Index Level Surface: Normal Change in Gait Speed: Normal Gait with Horizontal Head Turns: Normal Gait with Vertical Head Turns: Normal Gait and Pivot Turn: Mild Impairment Step Over Obstacle: Normal Step Around Obstacles: Normal Steps: Mild Impairment Total Score: 22  End of Session PT - End of Session Equipment Utilized During Treatment: Gait belt Activity Tolerance: Patient tolerated treatment well Patient left: in chair;with call bell/phone within reach;with family/visitor present  GP     [employer] 03/09/2013, 12:36 PM Sheran Lawless, PT [phone] 03/09/2013

## 2013-03-09 NOTE — Evaluation (Signed)
Occupational Therapy Evaluation Patient Details Name: Raymond Barrera MRN: [id number] DOB: [birth year] Today's Date: 03/09/2013 Time: 3086-5784 OT Time Calculation (min): 37 min  OT Assessment / Plan / Recommendation Clinical Impression  52 yo male admitetd with Lt side weakness s/p TPA . Pt with TIA this admission. Ot to sign off acutely. No acute needs and no equipment needs    OT Assessment  Patient does not need any further OT services    Follow Up Recommendations  No OT follow up    Barriers to Discharge      Equipment Recommendations  None recommended by OT    Recommendations for Other Services    Frequency       Precautions / Restrictions Precautions Precautions: None   Pertinent Vitals/Pain none    ADL  Eating/Feeding: Independent Where Assessed - Eating/Feeding: Chair Grooming: Wash/dry face;Independent Where Assessed - Grooming: Supported sitting Lower Body Dressing: Independent Where Assessed - Lower Body Dressing: Unsupported sitting Toilet Transfer: Independent Toilet Transfer Method: Sit to Barista: Regular height toilet Equipment Used: Gait belt Transfers/Ambulation Related to ADLs: Pt ambulating completing the DGI without balance deficits noted. pt appears to be at or very near baseline ADL Comments: Pt able to open all containers, scan room for items, don socks, complete bed mobility and no balance deficits noted. NO acute OT needs    OT Diagnosis:    OT Problem List:   OT Treatment Interventions:     OT Goals    Visit Information  Last OT Received On: 03/09/13 PT/OT Co-Evaluation/Treatment: Yes    Subjective Data  Subjective: "I got to have it, got to exercise" Patient Stated Goal: going back to work/ stop smoking   Prior Functioning     Home Living Lives With: Spouse Available Help at Discharge: Family;Available 24 hours/day Type of Home: House Home Access: Level entry Home Layout: One level Bathroom  Shower/Tub: Tub/shower unit;C[employer]ain Firefighter: Standard Home Adaptive Equipment: Straight cane Prior Function Level of Independence: Independent Able to Take Stairs?: Yes Driving: Yes 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: No difficulties Dominant Hand: Right         Vision/Perception Vision - History Baseline Vision: Wears glasses only for reading Patient Visual Report: No change from baseline   Cognition  Cognition Overall Cognitive Status: Appears within functional limits for tasks assessed/performed Arousal/Alertness: Awake/alert Orientation Level: Appears intact for tasks assessed Behavior During Session: [facility] for tasks performed    Extremity/Trunk Assessment Right Upper Extremity Assessment RUE ROM/Strength/Tone: Within functional levels RUE Sensation: WFL - Light Touch RUE Coordination: WFL - gross/fine motor Left Upper Extremity Assessment LUE ROM/Strength/Tone: Within functional levels LUE Sensation: WFL - Light Touch LUE Coordination: WFL - gross/fine motor Right Lower Extremity Assessment RLE ROM/Strength/Tone: Within functional levels RLE Sensation: WFL - Light Touch RLE Coordination: WFL - gross/fine motor Left Lower Extremity Assessment LLE ROM/Strength/Tone: Within functional levels LLE Sensation: WFL - Light Touch LLE Coordination: WFL - gross/fine motor     Mobility Bed Mobility Bed Mobility: Supine to Sit;Sitting - Scoot to Edge of Bed Supine to Sit: 7: Independent;HOB flat Sitting - Scoot to Edge of Bed: 7: Independent Transfers Sit to Stand: 6: Modified independent (Device/Increase time);From bed Stand to Sit: 6: Modified independent (Device/Increase time);To chair/3-in-1     Exercise Other Exercises Other Exercises: Pt and wife educated on stroke warning signs. Wife was a CNA and more aware than paitent of signs. Discussed exercise such as walking after ambulation  Balance  Standardized Balance Assessment Standardized Balance Assessment: Dynamic Gait Index Dynamic Gait Index Level Surface: Normal Change in Gait Speed: Normal Gait with Horizontal Head Turns: Normal Gait with Vertical Head Turns: Normal Gait and Pivot Turn: Mild Impairment Step Over Obstacle: Normal Step Around Obstacles: Normal Steps: Mild Impairment Total Score: 22 High Level Balance High Level Balance Activites: Direction changes;Turns;Sudden stops;Head turns;Side stepping   End of Session OT - End of Session Equipment Utilized During Treatment: Gait belt Activity Tolerance: Patient tolerated treatment well Patient left: in chair;with call bell/phone within reach;with family/visitor present Nurse Communication: Mobility status;Precautions  GO     Lucile Shutters 03/09/2013, 2:05 PM Pager: [phone]

## 2013-04-23 ENCOUNTER — Encounter (HOSPITAL_COMMUNITY): Payer: Self-pay | Admitting: *Deleted

## 2013-04-23 ENCOUNTER — Emergency Department (HOSPITAL_COMMUNITY)
Admission: EM | Admit: 2013-04-23 | Discharge: 2013-04-23 | Disposition: A | Discharge status: Home / Self Care | Payer: Self-pay | Attending: Emergency Medicine | Admitting: Emergency Medicine

## 2013-04-23 ENCOUNTER — Emergency Department (HOSPITAL_COMMUNITY): Payer: Self-pay

## 2013-04-23 DIAGNOSIS — M25572 Pain in left ankle and joints of left foot: Secondary | ICD-10-CM

## 2013-04-23 DIAGNOSIS — I10 Essential (primary) hypertension: Secondary | ICD-10-CM | POA: Insufficient documentation

## 2013-04-23 DIAGNOSIS — R202 Paresthesia of skin: Secondary | ICD-10-CM

## 2013-04-23 DIAGNOSIS — I252 Old myocardial infarction: Secondary | ICD-10-CM | POA: Insufficient documentation

## 2013-04-23 DIAGNOSIS — R209 Unspecified disturbances of skin sensation: Secondary | ICD-10-CM | POA: Insufficient documentation

## 2013-04-23 DIAGNOSIS — Z79899 Other long term (current) drug therapy: Secondary | ICD-10-CM | POA: Insufficient documentation

## 2013-04-23 DIAGNOSIS — F17200 Nicotine dependence, unspecified, uncomplicated: Secondary | ICD-10-CM | POA: Insufficient documentation

## 2013-04-23 DIAGNOSIS — Z9889 Other specified postprocedural states: Secondary | ICD-10-CM | POA: Insufficient documentation

## 2013-04-23 DIAGNOSIS — M25579 Pain in unspecified ankle and joints of unspecified foot: Secondary | ICD-10-CM | POA: Insufficient documentation

## 2013-04-23 DIAGNOSIS — Z7982 Long term (current) use of aspirin: Secondary | ICD-10-CM | POA: Insufficient documentation

## 2013-04-23 DIAGNOSIS — N289 Disorder of kidney and ureter, unspecified: Secondary | ICD-10-CM | POA: Insufficient documentation

## 2013-04-23 DIAGNOSIS — Z8673 Personal history of transient ischemic attack (TIA), and cerebral infarction without residual deficits: Secondary | ICD-10-CM | POA: Insufficient documentation

## 2013-04-23 DIAGNOSIS — E7800 Pure hypercholesterolemia, unspecified: Secondary | ICD-10-CM | POA: Insufficient documentation

## 2013-04-23 LAB — CBC
HCT: 40.8 % (ref 39.0–52.0)
MCV: 87 fL (ref 78.0–100.0)
RBC: 4.69 MIL/uL (ref 4.22–5.81)
WBC: 7 10*3/uL (ref 4.0–10.5)

## 2013-04-23 LAB — COMPREHENSIVE METABOLIC PANEL
AST: 24 U/L (ref 0–37)
BUN: 24 mg/dL — ABNORMAL HIGH (ref 6–23)
CO2: 28 mEq/L (ref 19–32)
Chloride: 97 mEq/L (ref 96–112)
Creatinine, Ser: 1.58 mg/dL — ABNORMAL HIGH (ref 0.50–1.35)
GFR calc non Af Amer: 48 mL/min — ABNORMAL LOW (ref >90)
Total Bilirubin: 0.4 mg/dL (ref 0.3–1.2)

## 2013-04-23 LAB — TROPONIN I: Troponin I: <0.3 ng/mL (ref <0.30)

## 2013-04-23 MED ORDER — OXYCODONE-ACETAMINOPHEN 5-325 MG PO TABS: 1.0000 | ORAL_TABLET | Freq: Four times a day (QID) | ORAL | Status: DC | PRN

## 2013-04-23 MED ORDER — IBUPROFEN 400 MG PO TABS
400.0000 mg | ORAL_TABLET | Freq: Once | ORAL | Status: DC
  Filled 2013-04-23: qty 1

## 2013-04-23 MED ORDER — HYDROCODONE-ACETAMINOPHEN 5-325 MG PO TABS
1.0000 | ORAL_TABLET | Freq: Once | ORAL | Status: AC
  Administered 2013-04-23: 1 via ORAL
  Filled 2013-04-23: qty 1

## 2013-04-23 NOTE — ED Notes (Signed)
Pt had a stroke last month. Pt states pain to left side of face which began at 0530 this morning with intermittent numbness and tingling and "pins and needles to left shoulder"

## 2013-04-23 NOTE — ED Provider Notes (Signed)
History     CSN: [id number]  Arrival date & time 04/23/13  1633   First MD Initiated Contact with Patient 04/23/13 1655      Chief Complaint  Patient presents with  . Facial Pain  . Numbness    (Consider location/radiation/quality/duration/timing/severity/associated sxs/prior treatment) The history is provided by the patient.  pt c/o left face tingling intermittently since early this morning, approximately 4 am. Lasts seconds to minutes. No facial droop or weakness. States was up at 4 am, couldn't sleep. Denies headache. Denies extremity numbness or weakness, but states did have tingling left shoulder for several seconds. No chest pain or discomfort. Denies problems w speech or vision. No problems w balance or coordination. Indicates was recently evaluated for tia symptoms. Was recommended to take an aspirin a day, but hasnt taken today. Pt states also wants left ankle checked as dull, throbbing pain intermittently in ankle since ankle surgery/fracture several years ago.  No acute or abrupt change today. No leg swelling. No redness to area.      Past Medical History  Diagnosis Date  . Hypertension   . High cholesterol   . Myocardial infarction 1987    "slight one"  . Complication of anesthesia   . Stroke     Past Surgical History  Procedure Laterality Date  . Ankle surgery  2008    left ankle-otif-[facility]  . Mass excision  09/13/2012    Procedure: EXCISION MASS;  Surgeon: Dalia Heading, MD;  Location: [facility];  Service: General;  Laterality: N/A;    No family history on file.  History  Substance Use Topics  . Smoking status: Current Every Day Smoker -- 0.50 packs/day for 30 years    Types: Cigarettes  . Smokeless tobacco: Not on file  . Alcohol Use: No      Review of Systems  Constitutional: Negative for fever and chills.  HENT: Negative for neck pain.   Eyes: Negative for redness.  Respiratory: Negative for cough and shortness of breath.   Cardiovascular:  Negative for chest pain, palpitations and leg swelling.  Gastrointestinal: Negative for abdominal pain.  Genitourinary: Negative for flank pain.  Musculoskeletal: Negative for back pain.  Skin: Negative for rash.  Neurological: Positive for numbness. Negative for weakness and headaches.  Hematological: Does not bruise/bleed easily.  Psychiatric/Behavioral: Negative for confusion.    Allergies  Review of patient's allergies indicates no known allergies.  Home Medications   Current Outpatient Rx  Name  Route  Sig  Dispense  Refill  . aspirin 325 MG tablet   Oral   Take 1 tablet (325 mg total) by mouth daily.   30 tablet   2   . hydrochlorothiazide (HYDRODIURIL) 25 MG tablet   Oral   Take 1 tablet (25 mg total) by mouth daily.   30 tablet   2   . simvastatin (ZOCOR) 20 MG tablet   Oral   Take 1 tablet (20 mg total) by mouth daily at 6 PM.   30 tablet   2     BP 113/84  Pulse 83  Temp(Src) 98.2 F (36.8 C) (Oral)  Resp 16  Ht 6\' 6"  (1.981 m)  Wt 263 lb (119.296 kg)  BMI 30.4 kg/m2  SpO2 97%  Physical Exam  Nursing note and vitals reviewed. Constitutional: He is oriented to person, place, and time. He appears well-developed and well-nourished. No distress.  HENT:  Head: Atraumatic.  Mouth/Throat: Oropharynx is clear and moist.  No facial or temporal  tenderness  Eyes: Pupils are equal, round, and reactive to light.  Neck: Normal range of motion. Neck supple. No tracheal deviation present. No thyromegaly present.  No bruit. c spine non tender. Good rom without pain.   Cardiovascular: Normal rate, regular rhythm, normal heart sounds and intact distal pulses.  Exam reveals no gallop and no friction rub.   No murmur heard. Pulmonary/Chest: Effort normal and breath sounds normal. No accessory muscle usage. No respiratory distress.  Abdominal: Soft. Bowel sounds are normal. He exhibits no distension. There is no tenderness.  Musculoskeletal: Normal range of motion. He  exhibits no edema and no tenderness.  Neurological: He is alert and oriented to person, place, and time. No cranial nerve deficit.  No facial droop or asymmetry.  Motor 5/5 bilaterally. No pronator drift.  Speech clear/fluent, no aphasia.  Ambulates w steady gait.   Skin: Skin is warm and dry.  Psychiatric: He has a normal mood and affect.    ED Course  Procedures (including critical care time)   Results for orders placed during the hospital encounter of 04/23/13  CBC      Result Value Range   WBC 7.0  4.0 - 10.5 K/uL   RBC 4.69  4.22 - 5.81 MIL/uL   Hemoglobin 14.0  13.0 - 17.0 g/dL   HCT 16.1  09.6 - 04.5 %   MCV 87.0  78.0 - 100.0 fL   MCH 29.9  26.0 - 34.0 pg   MCHC 34.3  30.0 - 36.0 g/dL   RDW 40.9  81.1 - 91.4 %   Platelets 214  150 - 400 K/uL  COMPREHENSIVE METABOLIC PANEL      Result Value Range   Sodium 137  135 - 145 mEq/L   Potassium 3.5  3.5 - 5.1 mEq/L   Chloride 97  96 - 112 mEq/L   CO2 28  19 - 32 mEq/L   Glucose, Bld 173 (*) 70 - 99 mg/dL   BUN 24 (*) 6 - 23 mg/dL   Creatinine, Ser 7.82 (*) 0.50 - 1.35 mg/dL   Calcium 9.7  8.4 - 95.6 mg/dL   Total Protein 7.6  6.0 - 8.3 g/dL   Albumin 3.7  3.5 - 5.2 g/dL   AST 24  0 - 37 U/L   ALT 40  0 - 53 U/L   Alkaline Phosphatase 104  39 - 117 U/L   Total Bilirubin 0.4  0.3 - 1.2 mg/dL   GFR calc non Af Amer 48 (*) >90 mL/min   GFR calc Af Amer 56 (*) >90 mL/min  TROPONIN I      Result Value Range   Troponin I <0.30  <0.30 ng/mL   Ct Head Wo Contrast  04/23/2013   *RADIOLOGY REPORT*  Clinical Data: Left arm numbness and pain  CT HEAD WITHOUT CONTRAST  Technique:  Contiguous axial images were obtained from the base of the skull through the vertex without contrast.  Comparison: 03/08/2013  Findings: Examination is degraded by patient motion.  Images of the vertex were repeated.  Mild periventricular white matter hypodensity is noted.  Linear area of right parietal encephalomalacia is stable (image 16).  No acute  hemorrhage, acute infarction, or mass lesion is seen.  No skull fracture.  Orbits and paranasal sinuses are intact. Minimal ethmoid mucoperiosteal thickening.  IMPRESSION: No acute intracranial finding.  Minimal ethmoid sinusitis.   Original Report Authenticated By: Christiana Pellant, M.D.      MDM  Labs. Ct.  Reviewed nursing  notes and prior charts for additional history.    Date: 04/23/2013  Rate: 81  Rhythm: normal sinus rhythm  QRS Axis: normal  Intervals: normal  ST/T Wave abnormalities: nonspecific T wave changes  Conduction Disutrbances:non specific interventricular conduction delay  Narrative Interpretation:   Old EKG Reviewed: unchanged  Cr 1.5, sl elevated. Po fluids. Discussed w pt avoid asa, nsaids. Will have hold hctz as bp well controlled, 110/70 on recheck.  Discussed labs, need for close pcp follow up this week.  Pt requests pain med for left ankle/chronic pain. No exam, prior healed scars noted. No erythema or increased warmth. No focal bony tenderness. Distal pulses palp. No recent injury. Pt requests referral to ortho for follow up for ankle.  Hydrocodone was given in ed for pain - pt states too weak, hydrocodone doesn't help, will give small quantity percocet for home.  Recheck pt no numbness or paresthesias. Neuro exam normal. Ct neg.   Pt appears stable for d/c.           Suzi Roots, MD 04/23/13 [phone]

## 2013-05-02 ENCOUNTER — Telehealth: Payer: Self-pay | Admitting: Orthopedic Surgery

## 2013-05-02 NOTE — Telephone Encounter (Signed)
Patient and wife [name] called following [name] visit 04/23/13, which they relay is an ankle swelling problem.  Patient was evaluated for other medical issues as well at this visit. Discharge instructions include:   "Follow up with primary care doctor this Tuesday for recheck - discuss above result, and have them recheck. Also have them recheck your blood pressure then. Your blood sugar is high today (173) - follow up with primary care doctor for recheck of blood sugar.   For ankle pain, you may follow up with orthopedist in the next 1-2 weeks - see referral - call office on Tuesday to arrange follow up appointment.   For facial tingling, follow up with neurologist in coming week. Continue to take an aspirin a day."  Patient relays that he has had previous orthopedic surgery on same ankle by a surgeon in [place].  He was advised that Dr. Romeo Apple would need to review medical records and films regarding ankle, prior to scheduling appointment.  He is aware he has the option to contact the orthopedic surgeon who had performed the procedure, to evaluate the ankle swelling.  Patient asked for me to relay this information to his wife also, which I did. She asked as he also had asked, about the # of remaining pills left from the medication prescribed by Emergency Room physician (Percocet.)  She also relays that patient does not currently have a primary care physician.  Patient will continue to try the recommendations per the Emergency room; may contact his previous surgeon first, and may also try to obtain records for review.  Ms. Reimers cell # is [phone], and patient's home, #[phone].

## 2013-08-31 ENCOUNTER — Emergency Department (HOSPITAL_COMMUNITY)
Admission: EM | Admit: 2013-08-31 | Discharge: 2013-08-31 | Discharge status: Against Medical Advice | Payer: MEDICAID | Attending: Emergency Medicine | Admitting: Emergency Medicine

## 2013-08-31 ENCOUNTER — Telehealth: Payer: Self-pay | Admitting: Neurology

## 2013-08-31 ENCOUNTER — Encounter (HOSPITAL_COMMUNITY): Payer: Self-pay

## 2013-08-31 DIAGNOSIS — F17200 Nicotine dependence, unspecified, uncomplicated: Secondary | ICD-10-CM | POA: Insufficient documentation

## 2013-08-31 DIAGNOSIS — I639 Cerebral infarction, unspecified: Secondary | ICD-10-CM

## 2013-08-31 DIAGNOSIS — I10 Essential (primary) hypertension: Secondary | ICD-10-CM | POA: Insufficient documentation

## 2013-08-31 DIAGNOSIS — R51 Headache: Secondary | ICD-10-CM | POA: Insufficient documentation

## 2013-08-31 HISTORY — DX: Cerebral infarction, unspecified: I63.9

## 2013-08-31 MED ORDER — HYDROCHLOROTHIAZIDE 25 MG PO TABS: 25.0000 mg | ORAL_TABLET | Freq: Every day | ORAL | Status: DC

## 2013-08-31 NOTE — Telephone Encounter (Signed)
I called and pt has been out of medication since august 2014.  Now has Bp 157/110 and is now wanting the last MD who saw pt to prescribe this (which was Dr. Pearlean Brownie) from [facility] 03/2014.  I asked about pcp, they have none ([facility] appt 09-05-13),  [facility] in [place].  I would consult Dr. Pearlean Brownie and if there is a problem then would call her back otherwise it is called in.  (30 days).  She verbalized understanding.

## 2013-08-31 NOTE — Telephone Encounter (Signed)
ok to refill it once till he sees his primary MD

## 2013-08-31 NOTE — ED Notes (Signed)
Family & patient states the pharmacy called & states that blood pressure medication is ready. Pt going to pick it up now. Pt informed to return back to ER if anything changes & he wants to be seen again. Pt left department with w/ stable gait & NAD noted.

## 2013-08-31 NOTE — ED Notes (Signed)
Pt reports having a headache off/on for several weeks, today was at soup kitchen and had bp checked, 152/112.  Has no pmd and is not taking any of his meds for last 2 months.

## 2013-08-31 NOTE — Telephone Encounter (Signed)
Consulted Dr. Pearlean Brownie ok to do. (30days).  Needs pcp.

## 2014-04-04 ENCOUNTER — Emergency Department (HOSPITAL_COMMUNITY)
Admission: EM | Admit: 2014-04-04 | Discharge: 2014-04-04 | Disposition: A | Discharge status: Home / Self Care | Payer: 59 | Attending: Emergency Medicine | Admitting: Emergency Medicine

## 2014-04-04 ENCOUNTER — Encounter (HOSPITAL_COMMUNITY): Payer: Self-pay | Admitting: Emergency Medicine

## 2014-04-04 ENCOUNTER — Emergency Department (HOSPITAL_COMMUNITY): Payer: 59

## 2014-04-04 DIAGNOSIS — M25559 Pain in unspecified hip: Secondary | ICD-10-CM | POA: Insufficient documentation

## 2014-04-04 DIAGNOSIS — R079 Chest pain, unspecified: Secondary | ICD-10-CM

## 2014-04-04 DIAGNOSIS — R51 Headache: Secondary | ICD-10-CM | POA: Insufficient documentation

## 2014-04-04 DIAGNOSIS — R059 Cough, unspecified: Secondary | ICD-10-CM | POA: Insufficient documentation

## 2014-04-04 DIAGNOSIS — I252 Old myocardial infarction: Secondary | ICD-10-CM | POA: Diagnosis not present

## 2014-04-04 DIAGNOSIS — R61 Generalized hyperhidrosis: Secondary | ICD-10-CM | POA: Insufficient documentation

## 2014-04-04 DIAGNOSIS — F17200 Nicotine dependence, unspecified, uncomplicated: Secondary | ICD-10-CM | POA: Diagnosis not present

## 2014-04-04 DIAGNOSIS — R072 Precordial pain: Secondary | ICD-10-CM | POA: Diagnosis not present

## 2014-04-04 DIAGNOSIS — R1110 Vomiting, unspecified: Secondary | ICD-10-CM | POA: Insufficient documentation

## 2014-04-04 DIAGNOSIS — I10 Essential (primary) hypertension: Secondary | ICD-10-CM | POA: Diagnosis not present

## 2014-04-04 DIAGNOSIS — M79609 Pain in unspecified limb: Secondary | ICD-10-CM | POA: Insufficient documentation

## 2014-04-04 DIAGNOSIS — F1410 Cocaine abuse, uncomplicated: Secondary | ICD-10-CM | POA: Insufficient documentation

## 2014-04-04 DIAGNOSIS — R42 Dizziness and giddiness: Secondary | ICD-10-CM | POA: Diagnosis not present

## 2014-04-04 DIAGNOSIS — G8929 Other chronic pain: Secondary | ICD-10-CM | POA: Diagnosis not present

## 2014-04-04 DIAGNOSIS — Z7982 Long term (current) use of aspirin: Secondary | ICD-10-CM | POA: Insufficient documentation

## 2014-04-04 DIAGNOSIS — Z8673 Personal history of transient ischemic attack (TIA), and cerebral infarction without residual deficits: Secondary | ICD-10-CM | POA: Diagnosis not present

## 2014-04-04 DIAGNOSIS — R05 Cough: Secondary | ICD-10-CM | POA: Diagnosis not present

## 2014-04-04 DIAGNOSIS — M25551 Pain in right hip: Secondary | ICD-10-CM

## 2014-04-04 DIAGNOSIS — Z79899 Other long term (current) drug therapy: Secondary | ICD-10-CM | POA: Diagnosis not present

## 2014-04-04 DIAGNOSIS — IMO0001 Reserved for inherently not codable concepts without codable children: Secondary | ICD-10-CM | POA: Diagnosis not present

## 2014-04-04 DIAGNOSIS — E7800 Pure hypercholesterolemia, unspecified: Secondary | ICD-10-CM | POA: Diagnosis not present

## 2014-04-04 LAB — CBC WITH DIFFERENTIAL/PLATELET
BASOS ABS: 0 10*3/uL (ref 0.0–0.1)
BASOS PCT: 0 % (ref 0–1)
EOS ABS: 0.1 10*3/uL (ref 0.0–0.7)
EOS PCT: 2 % (ref 0–5)
HEMATOCRIT: 42.1 % (ref 39.0–52.0)
Hemoglobin: 14.4 g/dL (ref 13.0–17.0)
Lymphocytes Relative: 33 % (ref 12–46)
Lymphs Abs: 2.1 10*3/uL (ref 0.7–4.0)
MCH: 30.3 pg (ref 26.0–34.0)
MCHC: 34.2 g/dL (ref 30.0–36.0)
MCV: 88.6 fL (ref 78.0–100.0)
MONO ABS: 0.3 10*3/uL (ref 0.1–1.0)
Monocytes Relative: 5 % (ref 3–12)
Neutro Abs: 3.9 10*3/uL (ref 1.7–7.7)
Neutrophils Relative %: 60 % (ref 43–77)
Platelets: 219 10*3/uL (ref 150–400)
RBC: 4.75 MIL/uL (ref 4.22–5.81)
RDW: 14 % (ref 11.5–15.5)
WBC: 6.4 10*3/uL (ref 4.0–10.5)

## 2014-04-04 LAB — BASIC METABOLIC PANEL
BUN: 17 mg/dL (ref 6–23)
CHLORIDE: 102 meq/L (ref 96–112)
CO2: 27 meq/L (ref 19–32)
CREATININE: 1.25 mg/dL (ref 0.50–1.35)
Calcium: 9.7 mg/dL (ref 8.4–10.5)
GFR calc non Af Amer: 63 mL/min — ABNORMAL LOW (ref >90)
GFR, EST AFRICAN AMERICAN: 73 mL/min — AB (ref >90)
Glucose, Bld: 112 mg/dL — ABNORMAL HIGH (ref 70–99)
Potassium: 3.8 mEq/L (ref 3.7–5.3)
Sodium: 140 mEq/L (ref 137–147)

## 2014-04-04 LAB — I-STAT TROPONIN, ED: Troponin i, poc: 0 ng/mL (ref 0.00–0.08)

## 2014-04-04 MED ORDER — IPRATROPIUM-ALBUTEROL 0.5-2.5 (3) MG/3ML IN SOLN
3.0000 mL | Freq: Once | RESPIRATORY_TRACT | Status: AC
  Administered 2014-04-04: 3 mL via RESPIRATORY_TRACT
  Filled 2014-04-04: qty 3

## 2014-04-04 MED ORDER — OXYCODONE-ACETAMINOPHEN 5-325 MG PO TABS: 1.0000 | ORAL_TABLET | ORAL | Status: DC | PRN

## 2014-04-04 MED ORDER — ALBUTEROL SULFATE (2.5 MG/3ML) 0.083% IN NEBU
5.0000 mg | INHALATION_SOLUTION | Freq: Once | RESPIRATORY_TRACT | Status: AC
  Administered 2014-04-04: 5 mg via RESPIRATORY_TRACT
  Filled 2014-04-04: qty 6

## 2014-04-04 MED ORDER — ALBUTEROL SULFATE HFA 108 (90 BASE) MCG/ACT IN AERS
2.0000 | INHALATION_SPRAY | Freq: Once | RESPIRATORY_TRACT | Status: AC
  Administered 2014-04-04: 2 via RESPIRATORY_TRACT
  Filled 2014-04-04: qty 6.7

## 2014-04-04 MED ORDER — OXYCODONE-ACETAMINOPHEN 5-325 MG PO TABS
1.0000 | ORAL_TABLET | Freq: Once | ORAL | Status: AC
  Administered 2014-04-04: 1 via ORAL
  Filled 2014-04-04: qty 1

## 2014-04-04 NOTE — ED Notes (Addendum)
Pt reports chest pain that started last night. Pt reports intermittent cough,n/v, SOB. Pt alert and oriented. Pt non-diaphoretic. Pt denies any chest pain at this time.Pt also reports intermittent chronic left leg pain.

## 2014-04-04 NOTE — ED Provider Notes (Signed)
CSN: [id number]     Arrival date & time 04/04/14  1757 History  This chart was scribed for Sharyon Cable, MD by Zettie Pho, ED Scribe. This patient was seen in room [facility] and the patient's care was started at 6:54 PM.    Chief Complaint  Patient presents with  . Chest Pain   Patient is a 53 y.o. male presenting with chest pain. The history is provided by the patient. No language interpreter was used.  Chest Pain Pain location:  Substernal area Pain radiates to:  Does not radiate Pain radiates to the back: no   Pain severity:  Moderate Onset quality:  Gradual Timing:  Intermittent Progression:  Unchanged Chronicity:  New Context: drug use   Relieved by:  Nothing Worsened by:  Coughing Ineffective treatments:  None tried Associated symptoms: cough, diaphoresis, dizziness and vomiting   Associated symptoms: no back pain, no fever and no weakness   Cough:    Cough characteristics:  Dry and productive   Severity:  Moderate   Onset quality:  Gradual   Timing:  Intermittent   Progression:  Unchanged   Chronicity:  New Vomiting:    Severity:  Moderate   Timing:  Intermittent   Progression:  Resolved Risk factors: high cholesterol, hypertension, male sex and smoking    HPI Comments: DAISHAWN LAUF is a 53 y.o. Male with a history of HTN, hypercholesterolemia, and MI who presents to the Emergency Department complaining of an intermittent pain to the substernal region of the chest with associated cough, a few episodes of white/yellow-colored emesis onset last night. Patient reports some dizziness, lightheadedness, and diaphoresis last night, but denies any today. Patient admits to snorting cocaine just prior to the onset of his symptoms. He denies any cocaine use today. He states that the chest pain is exacerbated with coughing and lifting, but not with activity.  He denies fever, weakness, syncope. Patient also has a history of stroke, but without longstanding weakness or other  immediate complications.   Patient is also complaining of some pain to the right leg, extending from the hip to the ankle, that he states is chronic in nature, but has been progressively worsening over the past few days. He denies any potential injury or trauma to the area. He denies back pain. He states that he has not been evaluated for this in the past.  Past Medical History  Diagnosis Date  . Hypertension   . High cholesterol   . Myocardial infarction 1987    "slight one"  . Complication of anesthesia   . Stroke    Past Surgical History  Procedure Laterality Date  . Ankle surgery  2008    left ankle-otif-[facility]  . Mass excision  09/13/2012    Procedure: EXCISION MASS;  Surgeon: Jamesetta So, MD;  Location: [facility];  Service: General;  Laterality: N/A;   History reviewed. No pertinent family history. History  Substance Use Topics  . Smoking status: Current Every Day Smoker -- 0.25 packs/day for 30 years    Types: Cigarettes  . Smokeless tobacco: Not on file  . Alcohol Use: No    Review of Systems  Constitutional: Positive for diaphoresis. Negative for fever.  Respiratory: Positive for cough.   Cardiovascular: Positive for chest pain.  Gastrointestinal: Positive for vomiting.  Musculoskeletal: Positive for arthralgias and myalgias. Negative for back pain.  Neurological: Positive for dizziness and light-headedness. Negative for syncope and weakness.  All other systems reviewed and are negative.  Allergies  Review of patient's allergies indicates no known allergies.  Home Medications   Prior to Admission medications   Medication Sig Start Date End Date Taking? Authorizing Provider  aspirin 325 MG tablet Take 1 tablet (325 mg total) by mouth daily. 03/09/13  Yes Donzetta Starch, NP  Chromium Picolinate 1000 MCG TABS Take 1 tablet by mouth every morning.   Yes Historical Provider, MD  glipiZIDE (GLUCOTROL) 10 MG tablet Take 10 mg by mouth daily.   Yes Historical  Provider, MD  hydrochlorothiazide (HYDRODIURIL) 25 MG tablet Take 1 tablet (25 mg total) by mouth daily. 08/31/13  Yes Garvin Fila, MD  Omega-3 Fatty Acids (FISH OIL) 1000 MG CAPS Take 1 capsule by mouth every morning.   Yes Historical Provider, MD  simvastatin (ZOCOR) 20 MG tablet Take 1 tablet (20 mg total) by mouth daily at 6 PM. 03/09/13  Yes Donzetta Starch, NP   Triage Vitals: BP 126/94  Pulse 89  Temp(Src) 97.7 F (36.5 C) (Oral)  Resp 22  Ht 6\' 6"  (1.981 m)  Wt 286 lb (129.729 kg)  BMI 33.06 kg/m2  SpO2 97%  Physical Exam CONSTITUTIONAL: Well developed/well nourished HEAD: Normocephalic/atraumatic EYES: EOMI/PERRL ENMT: Mucous membranes moist NECK: supple no meningeal signs SPINE:entire spine nontender CV: S1/S2 noted, no murmurs/rubs/gallops noted LUNGS: no apparent distress, crackles in the left base ABDOMEN: soft, nontender, no rebound or guarding GU:no cva tenderness NEURO: Pt is awake/alert, moves all extremitiesx4 EXTREMITIES: pulses normal/equal, full ROM, no calf tenderness or edema Full ROM of right hip.  Mild tenderness to palpation of right hip SKIN: warm, color normal PSYCH: no abnormalities of mood noted  ED Course  Procedures  DIAGNOSTIC STUDIES: Oxygen Saturation is 97% on room air, normal by my interpretation.    COORDINATION OF CARE: 6:09 PM- Ordered an EKG and a chest x-ray.   7:03 PM- Ordered BMP, CBC, and troponin. Ordered Percocet, albuterol and Duoneb to manage symptoms. Discussed treatment plan with patient at bedside and patient verbalized agreement.   8:25 PM- Patient reports feeling much better after receiving the medications and states that his chest pain has subsided. Discussed that lab and imaging results were normal. Will discharge patient with an inhaler. Discussed that the leg pain is likely due to arthritis. Advised patient to follow up with the referred orthopedist. Will discharge patient with a short course of Percocet. Discussed  treatment plan with patient at bedside and patient verbalized agreement.    For his CP, it was triggered to significant coughing episode last night and pain worse with cough/movement of upper body.  Low suspicion for ACS/PE/Dissection He is well appearing I advised need to quit cocaine use   Labs Review Labs Reviewed  BASIC METABOLIC PANEL - Abnormal; Notable for the following:    Glucose, Bld 112 (*)    GFR calc non Af Amer 63 (*)    GFR calc Af Amer 73 (*)    All other components within normal limits  CBC WITH DIFFERENTIAL  Randolm Idol, ED    Imaging Review Dg Chest 2 View  04/04/2014   CLINICAL DATA:  Chest pain.  EXAM: CHEST  2 VIEW  COMPARISON:  03/08/2013 and 02/03/2011  FINDINGS: Heart size and pulmonary vascularity are normal. No acute infiltrates or effusions. Chronic slight elevation of the left hemidiaphragm with chronic linear scarring at the left lung base, unchanged since 02/03/2011. No osseous abnormality.  IMPRESSION: No acute disease.  Scarring at the left lung base.   Electronically  Signed   By: Rozetta Nunnery M.D.   On: 04/04/2014 19:05     EKG Interpretation   Date/Time:  Tuesday Apr 04 2014 18:17:39 EDT Ventricular Rate:  78 PR Interval:  187 QRS Duration: 116 QT Interval:  417 QTC Calculation: 475 R Axis:   28 Text Interpretation:  Sinus rhythm Nonspecific intraventricular conduction  delay Nonspecific T abnormalities, lateral leads Minimal ST elevation,  anterior leads Confirmed by Christy Gentles  MD, Elenore Rota ([id number]) on 04/04/2014  7:01:41 PM      MDM   Final diagnoses:  Chest pain  Right hip pain  Cough  Cocaine abuse    Nursing notes including past medical history and social history reviewed and considered in documentation Labs/vital reviewed and considered xrays reviewed and considered   I personally performed the services described in this documentation, which was scribed in my presence. The recorded information has been reviewed and is  accurate.       Sharyon Cable, MD 04/04/14 2041

## 2014-04-04 NOTE — ED Notes (Signed)
Wife is concerned with right leg, asking for xray, not swelling at present, states at time leg has pain and swelling

## 2014-04-04 NOTE — ED Notes (Signed)
Patient given discharge instruction, verbalized understand. Patient ambulatory out of the department.  

## 2014-04-04 NOTE — Discharge Instructions (Signed)
Your caregiver has diagnosed you as having chest pain that is not specific for one problem, but does not require admission.  Chest pain comes from many different causes.  SEEK IMMEDIATE MEDICAL ATTENTION IF: You have severe chest pain, especially if the pain is crushing or pressure-like and spreads to the arms, back, neck, or jaw, or if you have sweating, nausea (feeling sick to your stomach), or shortness of breath. THIS IS AN EMERGENCY. Don't wait to see if the pain will go away. Get medical help at once. Call 911 or 0 (operator). DO NOT drive yourself to the hospital.  Your chest pain gets worse and does not go away with rest.  You have an attack of chest pain lasting longer than usual, despite rest and treatment with the medications your caregiver has prescribed.  You wake from sleep with chest pain or shortness of breath.  You feel dizzy or faint.  You have chest pain not typical of your usual pain for which you originally saw your caregiver.   Arthralgia Arthralgia is joint pain. A joint is a place where two bones meet. Joint pain can happen for many reasons. The joint can be bruised, stiff, infected, or weak from aging. Pain usually goes away after resting and taking medicine for soreness.  HOME CARE  Rest the joint as told by your doctor.  Keep the sore joint raised (elevated) for the first 24 hours.  Put ice on the joint area.  Put ice in a plastic bag.  Place a towel between your skin and the bag.  Leave the ice on for 15-20 minutes, 03-04 times a day.  Wear your splint, casting, elastic bandage, or sling as told by your doctor.  Only take medicine as told by your doctor. Do not take aspirin.  Use crutches as told by your doctor. Do not put weight on the joint until told to by your doctor. GET HELP RIGHT AWAY IF:   You have bruising, puffiness (swelling), or more pain.  Your fingers or toes turn blue or start to lose feeling (numb).  Your medicine does not lessen the  pain.  Your pain becomes severe.  You have a temperature by mouth above 102 F (38.9 C), not controlled by medicine.  You cannot move or use the joint. MAKE SURE YOU:   Understand these instructions.  Will watch your condition.  Will get help right away if you are not doing well or get worse. Document Released: 11/05/2009 Document Revised: 02/09/2012 Document Reviewed: 11/05/2009 [facility] Patient Information 2014 [place], [place].

## 2014-04-14 ENCOUNTER — Emergency Department (HOSPITAL_COMMUNITY): Payer: 59

## 2014-04-14 ENCOUNTER — Encounter (HOSPITAL_COMMUNITY): Payer: Self-pay | Admitting: Emergency Medicine

## 2014-04-14 ENCOUNTER — Emergency Department (HOSPITAL_COMMUNITY)
Admission: EM | Admit: 2014-04-14 | Discharge: 2014-04-14 | Disposition: A | Discharge status: Home / Self Care | Payer: 59 | Attending: Emergency Medicine | Admitting: Emergency Medicine

## 2014-04-14 DIAGNOSIS — M545 Low back pain, unspecified: Secondary | ICD-10-CM | POA: Insufficient documentation

## 2014-04-14 DIAGNOSIS — Z7982 Long term (current) use of aspirin: Secondary | ICD-10-CM | POA: Diagnosis not present

## 2014-04-14 DIAGNOSIS — F17200 Nicotine dependence, unspecified, uncomplicated: Secondary | ICD-10-CM | POA: Diagnosis not present

## 2014-04-14 DIAGNOSIS — I252 Old myocardial infarction: Secondary | ICD-10-CM | POA: Insufficient documentation

## 2014-04-14 DIAGNOSIS — I10 Essential (primary) hypertension: Secondary | ICD-10-CM | POA: Diagnosis not present

## 2014-04-14 DIAGNOSIS — Z79899 Other long term (current) drug therapy: Secondary | ICD-10-CM | POA: Diagnosis not present

## 2014-04-14 DIAGNOSIS — E7800 Pure hypercholesterolemia, unspecified: Secondary | ICD-10-CM | POA: Insufficient documentation

## 2014-04-14 DIAGNOSIS — M25559 Pain in unspecified hip: Secondary | ICD-10-CM | POA: Diagnosis present

## 2014-04-14 DIAGNOSIS — Z8673 Personal history of transient ischemic attack (TIA), and cerebral infarction without residual deficits: Secondary | ICD-10-CM | POA: Insufficient documentation

## 2014-04-14 DIAGNOSIS — M76899 Other specified enthesopathies of unspecified lower limb, excluding foot: Secondary | ICD-10-CM | POA: Insufficient documentation

## 2014-04-14 DIAGNOSIS — M7071 Other bursitis of hip, right hip: Secondary | ICD-10-CM

## 2014-04-14 MED ORDER — OXYCODONE-ACETAMINOPHEN 5-325 MG PO TABS
2.0000 | ORAL_TABLET | Freq: Once | ORAL | Status: AC
  Administered 2014-04-14: 2 via ORAL
  Filled 2014-04-14: qty 2

## 2014-04-14 MED ORDER — OXYCODONE-ACETAMINOPHEN 5-325 MG PO TABS
ORAL_TABLET | ORAL | Status: AC
  Filled 2014-04-14: qty 1

## 2014-04-14 MED ORDER — OXYCODONE-ACETAMINOPHEN 10-325 MG PO TABS: 1.0000 | ORAL_TABLET | ORAL | Status: DC | PRN

## 2014-04-14 NOTE — ED Notes (Signed)
Seen here last week for same - c/o pain to right hip, denies injury.  States percocet is not helping.

## 2014-04-14 NOTE — Discharge Instructions (Signed)
Hip Bursitis °Bursitis is a puffiness (swelling) and soreness of a fluid-filled sac (bursa). This sac covers and protects the joint. °HOME CARE °· Put ice on the injured area. °· Put ice in a plastic bag. °· Place a towel between your skin and the bag. °· Leave the ice on for 15-20 minutes, 03-04 times a day. °· Rest the painful joint as much as possible. Move your joint at least 4 times a day. When pain lessens, start normal, slow movements and normal activities. °· Only take medicine as told by your doctor. °· Use crutches as told. °· Raise (elevate) your painful joint. Use pillows for propping your legs and hips. °· Get a massage to lessen pain. °GET HELP RIGHT AWAY IF: °· Your pain increases or does not improve during treatment. °· You have a fever. °· You feel heat coming from the affected area. °· You see redness and puffiness around the affected area. °· You have any questions or concerns. °MAKE SURE YOU: °· Understand these instructions. °· Will watch your condition. °· Will get help right away if you are not well or get worse. °Document Released: 12/20/2010 Document Revised: 02/09/2012 Document Reviewed: 12/20/2010 °[employer]® Patient Information ©2014 [employer]. ° °

## 2014-04-14 NOTE — ED Provider Notes (Signed)
Medical screening examination/treatment/procedure(s) were performed by non-physician practitioner and as supervising physician I was immediately available for consultation/collaboration.   EKG Interpretation None        Ezequiel Essex, MD 04/14/14 2325

## 2014-04-14 NOTE — ED Provider Notes (Signed)
CSN: [id number]     Arrival date & time 04/14/14  1649 History   First MD Initiated Contact with Patient 04/14/14 1658     Chief Complaint  Patient presents with  . Hip Pain     (Consider location/radiation/quality/duration/timing/severity/associated sxs/prior Treatment) HPI Comments: Raymond Barrera is a 53 y.o. Male presenting with right hip pain which has been present for at least the past 6 months but worsened over the past 2 weeks.  He denies injury or previous problems with his hip,  But does endorse intermittent mild problems with low back pain which is not currently affecting him.  His pain is worse with movement and with palpation and radiates into his lateral knee area.  He describes burning and sharp pain.  Has had no swelling, redness or rash associated.  He was seen here 10 days ago at which time he was prescribed Percocet which did not relieve his pain.  He is scheduled to see Dr. Aline Brochure in 3 weeks for further management of this condition.  He denies fevers or chills, and denies weakness or numbness in his lower extremities.     The history is provided by the patient and the spouse.    Past Medical History  Diagnosis Date  . Hypertension   . High cholesterol   . Myocardial infarction 1987    "slight one"  . Complication of anesthesia   . Stroke    Past Surgical History  Procedure Laterality Date  . Ankle surgery  2008    left ankle-otif-[facility]  . Mass excision  09/13/2012    Procedure: EXCISION MASS;  Surgeon: Jamesetta So, MD;  Location: [facility];  Service: General;  Laterality: N/A;   No family history on file. History  Substance Use Topics  . Smoking status: Current Every Day Smoker -- 0.25 packs/day for 30 years    Types: Cigarettes  . Smokeless tobacco: Not on file  . Alcohol Use: No    Review of Systems  Constitutional: Negative for fever.  Musculoskeletal: Positive for arthralgias. Negative for joint swelling and myalgias.  Skin: Negative for rash and  wound.  Neurological: Negative for weakness and numbness.      Allergies  Review of patient's allergies indicates no known allergies.  Home Medications   Prior to Admission medications   Medication Sig Start Date End Date Taking? Authorizing Provider  aspirin 325 MG tablet Take 1 tablet (325 mg total) by mouth daily. 03/09/13  Yes Donzetta Starch, NP  Chromium Picolinate 1000 MCG TABS Take 1 tablet by mouth every morning.   Yes Historical Provider, MD  glipiZIDE (GLUCOTROL) 10 MG tablet Take 10 mg by mouth daily.   Yes Historical Provider, MD  hydrochlorothiazide (HYDRODIURIL) 25 MG tablet Take 1 tablet (25 mg total) by mouth daily. 08/31/13  Yes Garvin Fila, MD  Omega-3 Fatty Acids (FISH OIL) 1000 MG CAPS Take 1 capsule by mouth every morning.   Yes Historical Provider, MD  oxyCODONE-acetaminophen (PERCOCET/ROXICET) 5-325 MG per tablet Take 1 tablet by mouth every 4 (four) hours as needed for severe pain. 04/04/14  Yes Sharyon Cable, MD  simvastatin (ZOCOR) 20 MG tablet Take 1 tablet (20 mg total) by mouth daily at 6 PM. 03/09/13  Yes Donzetta Starch, NP  oxyCODONE-acetaminophen (PERCOCET) 10-325 MG per tablet Take 1 tablet by mouth every 4 (four) hours as needed for pain. 04/14/14   Evalee Jefferson, PA-C   BP 143/92  Pulse 110  Temp(Src) 97.3 F (36.3  C) (Oral)  Resp 16  Ht 6\' 6"  (1.981 m)  Wt 286 lb (129.729 kg)  BMI 33.06 kg/m2  SpO2 99% Physical Exam  Constitutional: He appears well-developed and well-nourished.  HENT:  Head: Atraumatic.  Neck: Normal range of motion.  Cardiovascular:  Pulses equal bilaterally  Musculoskeletal: He exhibits tenderness.       Right hip: He exhibits bony tenderness. He exhibits no swelling and no crepitus.  ttp over right lateral hip trochanter.  No edema, rash, no crepitus with ROM.  Increased pain with right hip internal rotation, non tender with external rotation,  No groin pain.  Neurological: He is alert. He has normal strength. He displays  normal reflexes. No sensory deficit.  Skin: Skin is warm and dry.  Psychiatric: He has a normal mood and affect.    ED Course  Procedures (including critical care time) Labs Review Labs Reviewed - No data to display  Imaging Review Dg Hip Complete Right  04/14/2014   CLINICAL DATA:  Severe right hip pain without injury with symptoms radiating laterally and inferiorly  EXAM: RIGHT HIP - COMPLETE 2+ VIEW  COMPARISON:  None.  FINDINGS: The bony pelvis appears adequately mineralized. There is no evidence of an acute fracture. AP and frog-leg lateral views of the right hip reveal no acute fracture nor dislocation. A small inferior lip osteophyte of the acetabulum on the right is suspected. No significant joint space narrowing is demonstrated.  IMPRESSION: There is no acute bony abnormality of the pelvis or right hip.   Electronically Signed   By: David  Martinique   On: 04/14/2014 18:09     EKG Interpretation None      MDM   Final diagnoses:  Bursitis of hip, right    Patients labs and/or radiological studies were viewed and considered during the medical decision making and disposition process. Pt was prescribed oxycodone 10 mg,  First dose given in ed and he did obtain improvement in pain.  Also encouraged aleve bid which he has at home,  Heat tx.  F/u with Dr. Aline Brochure which he is already scheduled.  The patient appears reasonably screened and/or stabilized for discharge and I doubt any other medical condition or other [facility] requiring further screening, evaluation, or treatment in the ED at this time prior to discharge.      Evalee Jefferson, PA-C 04/14/14 1859

## 2014-05-01 ENCOUNTER — Emergency Department (HOSPITAL_COMMUNITY)
Admission: EM | Admit: 2014-05-01 | Discharge: 2014-05-01 | Disposition: A | Discharge status: Home / Self Care | Payer: 59 | Attending: Emergency Medicine | Admitting: Emergency Medicine

## 2014-05-01 ENCOUNTER — Telehealth: Payer: Self-pay | Admitting: Orthopedic Surgery

## 2014-05-01 ENCOUNTER — Encounter (HOSPITAL_COMMUNITY): Payer: Self-pay | Admitting: Emergency Medicine

## 2014-05-01 DIAGNOSIS — F17200 Nicotine dependence, unspecified, uncomplicated: Secondary | ICD-10-CM | POA: Diagnosis not present

## 2014-05-01 DIAGNOSIS — IMO0002 Reserved for concepts with insufficient information to code with codable children: Secondary | ICD-10-CM | POA: Insufficient documentation

## 2014-05-01 DIAGNOSIS — Z79899 Other long term (current) drug therapy: Secondary | ICD-10-CM | POA: Insufficient documentation

## 2014-05-01 DIAGNOSIS — M79609 Pain in unspecified limb: Secondary | ICD-10-CM | POA: Diagnosis present

## 2014-05-01 DIAGNOSIS — M25559 Pain in unspecified hip: Secondary | ICD-10-CM | POA: Diagnosis not present

## 2014-05-01 DIAGNOSIS — M5416 Radiculopathy, lumbar region: Secondary | ICD-10-CM

## 2014-05-01 DIAGNOSIS — E7800 Pure hypercholesterolemia, unspecified: Secondary | ICD-10-CM | POA: Insufficient documentation

## 2014-05-01 DIAGNOSIS — Z8673 Personal history of transient ischemic attack (TIA), and cerebral infarction without residual deficits: Secondary | ICD-10-CM | POA: Diagnosis not present

## 2014-05-01 DIAGNOSIS — I252 Old myocardial infarction: Secondary | ICD-10-CM | POA: Diagnosis not present

## 2014-05-01 DIAGNOSIS — I10 Essential (primary) hypertension: Secondary | ICD-10-CM | POA: Insufficient documentation

## 2014-05-01 DIAGNOSIS — Z9889 Other specified postprocedural states: Secondary | ICD-10-CM | POA: Diagnosis not present

## 2014-05-01 MED ORDER — HYDROMORPHONE HCL PF 2 MG/ML IJ SOLN
2.0000 mg | Freq: Once | INTRAMUSCULAR | Status: AC
  Administered 2014-05-01: 2 mg via INTRAMUSCULAR
  Filled 2014-05-01: qty 1

## 2014-05-01 MED ORDER — PREDNISONE (PAK) 10 MG PO TABS: ORAL_TABLET | ORAL | Status: DC

## 2014-05-01 MED ORDER — TRAMADOL HCL 50 MG PO TABS: 50.0000 mg | ORAL_TABLET | Freq: Four times a day (QID) | ORAL | Status: DC | PRN

## 2014-05-01 MED ORDER — OXYCODONE-ACETAMINOPHEN 10-325 MG PO TABS: 1.0000 | ORAL_TABLET | ORAL | Status: DC | PRN

## 2014-05-01 NOTE — ED Notes (Signed)
Pt states right leg pain which begins in hip and groin area, radiating down entire leg, ongoing for months. Pts wife states last time he was seen he was told he either may have bursitis or a pinched nerve. Spouse states unable to get appt with Dr. Aline Brochure because PCP has to refer and PCP assigned to pt is not accepting new pts. Informed pt that they need to contact insurance company changed to PCP that is currently accepting new pts.

## 2014-05-01 NOTE — Telephone Encounter (Signed)
Call and visit in office, from patient and wife, requesting appointment following Emergency Room visit for problem of hip pain.  Reviewed patient's new insurance, effective 05/02/15, [employer], which requires primary care physician referral prior to scheduling with specialist.  Ms. Raymond Barrera, wife, states that they have called the provider on the insurance card, Dr. Lane Hacker, and was told that this office is not accepting new patients.  They are going to now contac tthis office again, in case there are other providers in the practice who may be taking new patients. Also will call insurer to find out if he may be assigned to a provider who is accepting new patients.  Appointment pending referral.  Phone# [phone] [facility]) [phone] [facility])

## 2014-05-01 NOTE — Care Management Note (Signed)
ED/CM noted patient did not have health insurance and/or PCP listed in the computer.  Patient was given the [place] resource handout with information on the clinics, food pantries, and the handout for new health insurance sign-up.  Patient expressed appreciation for information received. 

## 2014-05-01 NOTE — ED Notes (Signed)
Pt states he wants to stay on Percocet 10. States tramadol does not work and wants to speak with the EDP. EDP to be made aware.

## 2014-05-01 NOTE — ED Provider Notes (Signed)
CSN: [id number]     Arrival date & time 05/01/14  1202 History   First MD Initiated Contact with Patient 05/01/14 1302     Chief Complaint  Patient presents with  . Leg Pain    HPI Patient presents to emergency room with complaints of 6 months of right leg pain. Patient states the pain has been in the right hip. However, it also radiates down his leg towards the knee. The pain has been getting worse recently in the last month or so. Patient states the pain is sharp and aching. Movement and walking increases the pain. He denies any trouble with numbness or weakness. He denies any fevers. He denies any trouble with urinating and has not had any issues with incontinence. Patient has been seen in the emergency department a few times. He was referred to an orthopedic doctor after getting a prescription for pain medications. Patient has not been able to make any followup appointments. He is a new health insurance requires him to get a referral from a primary care Dr. The primary care doctor listed on his insurance is not accepting new patients. The patient ran out of pain medications previously prescribed him so he came back to the emergency room. Past Medical History  Diagnosis Date  . Hypertension   . High cholesterol   . Myocardial infarction 1987    "slight one"  . Complication of anesthesia   . Stroke    Past Surgical History  Procedure Laterality Date  . Ankle surgery  2008    left ankle-otif-[facility]  . Mass excision  09/13/2012    Procedure: EXCISION MASS;  Surgeon: Jamesetta So, MD;  Location: [facility];  Service: General;  Laterality: N/A;   History reviewed. No pertinent family history. History  Substance Use Topics  . Smoking status: Current Every Day Smoker -- 0.25 packs/day for 30 years    Types: Cigarettes  . Smokeless tobacco: Not on file  . Alcohol Use: No    Review of Systems  All other systems reviewed and are negative.     Allergies  Review of patient's allergies  indicates no known allergies.  Home Medications   Prior to Admission medications   Medication Sig Start Date End Date Taking? Authorizing Provider  benazepril (LOTENSIN) 10 MG tablet Take 10 mg by mouth daily.   Yes Historical Provider, MD  Chromium Picolinate 1000 MCG TABS Take 1 tablet by mouth every morning.   Yes Historical Provider, MD  glipiZIDE (GLUCOTROL) 10 MG tablet Take 10 mg by mouth daily.   Yes Historical Provider, MD  hydrochlorothiazide (HYDRODIURIL) 25 MG tablet Take 1 tablet (25 mg total) by mouth daily. 08/31/13  Yes Garvin Fila, MD  Omega-3 Fatty Acids (FISH OIL) 1000 MG CAPS Take 1 capsule by mouth every morning.   Yes Historical Provider, MD  simvastatin (ZOCOR) 20 MG tablet Take 1 tablet (20 mg total) by mouth daily at 6 PM. 03/09/13  Yes Donzetta Starch, NP   BP 129/93  Pulse 88  Temp(Src) 97.5 F (36.4 C) (Oral)  Resp 18  Ht 6\' 6"  (1.981 m)  Wt 276 lb (125.193 kg)  BMI 31.90 kg/m2  SpO2 96% Physical Exam  Nursing note and vitals reviewed. Constitutional: He appears well-developed and well-nourished. No distress.  HENT:  Head: Normocephalic and atraumatic.  Right Ear: External ear normal.  Left Ear: External ear normal.  Nose: Nose normal.  Eyes: Conjunctivae and EOM are normal. Right eye exhibits no discharge. Left  eye exhibits no discharge. No scleral icterus.  Neck: Neck supple. No tracheal deviation present.  Cardiovascular: Normal rate.   Pulmonary/Chest: Effort normal. No stridor. No respiratory distress.  Abdominal: Hernia confirmed negative in the right inguinal area.  Musculoskeletal: He exhibits no edema.       Right hip: He exhibits tenderness. He exhibits no swelling, no crepitus and no deformity.       Lumbar back: He exhibits no tenderness, no swelling and no edema.  Pain with lifting his right leg off the bed  Neurological: He is alert. He is not disoriented. No sensory deficit. Cranial nerve deficit: no gross deficits. He exhibits normal  muscle tone. Coordination normal.  Reflex Scores:      Patellar reflexes are 2+ on the right side and 2+ on the left side.      Achilles reflexes are 2+ on the right side and 2+ on the left side. Skin: Skin is warm and dry. No rash noted. He is not diaphoretic. No erythema.  Psychiatric: He has a normal mood and affect. His behavior is normal. Thought content normal.    ED Course  Procedures (including critical care time) Patient had x-rays of right hip last month. No acute abnormalities were noted  MDM   Final diagnoses:  Lumbar radiculopathy    Patient's having pain in his right hip. It's possible this could be related to a bursitis or possible lumbar radiculopathy.  Patient is not having any acute weakness or numbness. There are no indications for acute neurosurgical intervention. Do not feel he requires emergent MRI testing. I did stress the importance of following up with her primary care doctor or orthopedic doctor. I encouraged the family to speak with [facility] today so he can set up an appointment with a new primary care Dr.    Rx pain meds, IM dilaudid given in the ED    Dorie Rank, MD 05/01/14 1320

## 2014-05-01 NOTE — ED Notes (Signed)
Pt c/o right leg pain ongoing for months, pt denies any injury, warm to touch per family member, tried to see Dr. Aline Brochure for it but needs a referral

## 2014-05-01 NOTE — Discharge Instructions (Signed)

## 2014-05-10 NOTE — Telephone Encounter (Signed)
No further response from patient or referring physician.  Patient aware we can schedule upon receipt of referrals.

## 2014-05-26 ENCOUNTER — Encounter (HOSPITAL_COMMUNITY): Payer: Self-pay | Admitting: Emergency Medicine

## 2014-05-26 ENCOUNTER — Emergency Department (HOSPITAL_COMMUNITY)
Admission: EM | Admit: 2014-05-26 | Discharge: 2014-05-26 | Disposition: A | Discharge status: Home / Self Care | Payer: 59 | Attending: Emergency Medicine | Admitting: Emergency Medicine

## 2014-05-26 DIAGNOSIS — G8929 Other chronic pain: Secondary | ICD-10-CM | POA: Diagnosis not present

## 2014-05-26 DIAGNOSIS — M25559 Pain in unspecified hip: Secondary | ICD-10-CM | POA: Insufficient documentation

## 2014-05-26 DIAGNOSIS — F17200 Nicotine dependence, unspecified, uncomplicated: Secondary | ICD-10-CM | POA: Insufficient documentation

## 2014-05-26 DIAGNOSIS — E7800 Pure hypercholesterolemia, unspecified: Secondary | ICD-10-CM | POA: Diagnosis not present

## 2014-05-26 DIAGNOSIS — I10 Essential (primary) hypertension: Secondary | ICD-10-CM | POA: Insufficient documentation

## 2014-05-26 DIAGNOSIS — Z79899 Other long term (current) drug therapy: Secondary | ICD-10-CM | POA: Insufficient documentation

## 2014-05-26 DIAGNOSIS — Z760 Encounter for issue of repeat prescription: Secondary | ICD-10-CM | POA: Insufficient documentation

## 2014-05-26 DIAGNOSIS — Z9889 Other specified postprocedural states: Secondary | ICD-10-CM | POA: Insufficient documentation

## 2014-05-26 DIAGNOSIS — I252 Old myocardial infarction: Secondary | ICD-10-CM | POA: Diagnosis not present

## 2014-05-26 DIAGNOSIS — Z8673 Personal history of transient ischemic attack (TIA), and cerebral infarction without residual deficits: Secondary | ICD-10-CM | POA: Diagnosis not present

## 2014-05-26 DIAGNOSIS — M25551 Pain in right hip: Secondary | ICD-10-CM

## 2014-05-26 MED ORDER — OXYCODONE-ACETAMINOPHEN 10-325 MG PO TABS: 1.0000 | ORAL_TABLET | Freq: Four times a day (QID) | ORAL | Status: DC | PRN

## 2014-05-26 MED ORDER — OXYCODONE-ACETAMINOPHEN 5-325 MG PO TABS
2.0000 | ORAL_TABLET | Freq: Once | ORAL | Status: AC
  Administered 2014-05-26: 2 via ORAL
  Filled 2014-05-26: qty 2

## 2014-05-26 NOTE — ED Notes (Signed)
Provided patient with graham crackers, PB and diet coke.

## 2014-05-26 NOTE — ED Notes (Signed)
Patient scheduled to see Dr. Cindie Laroche on 6/30.  He has currently run out of pain medication for his hip and back pain.   Has had labwork drawn for determination of medical therapy for HTN.  Currently patient has no meds for HTN control.

## 2014-05-26 NOTE — Discharge Instructions (Signed)

## 2014-05-26 NOTE — ED Notes (Signed)
Discussed elevated BP w/J. Idol, PA-C.  She is deferring treatment to Dr. Cindie Laroche.  Patient with no complaints at this time. Respirations even and unlabored. Skin warm/dry. Discharge instructions reviewed with patient at this time. Patient given opportunity to voice concerns/ask questions. Patient discharged at this time and left Emergency Department with steady gait.

## 2014-05-26 NOTE — ED Notes (Signed)
Right leg and groin pain.  Sees dr. Cindie Laroche for this pain.  Is having test run to see what is causing the pain.  Ran out of pain med and does have an appointment till 6/30.  Is here for pain medicine.

## 2014-05-29 NOTE — ED Provider Notes (Signed)
CSN: [id number]     Arrival date & time 05/26/14  1022 History   First MD Initiated Contact with Patient 05/26/14 1051     Chief Complaint  Patient presents with  . Medication Refill     (Consider location/radiation/quality/duration/timing/severity/associated sxs/prior Treatment) The history is provided by the patient and the spouse.    Raymond Barrera is a 53 y.o. male presenting for a medication refill of his pain medicine.  He describes chronic right upper leg and groin pain which radiates into the right knee and has been present for at least the past 6 months.  This pain is aching and worse with movement and palpation.  He has just established care with Dr. Cindie Laroche who is sending him for tests of his leg (pt is unclear of details of this workup) but has run out of his pain medicine and is not scheduled to followup with his new pcp until 6/30.  Additionally he has run out of his blood pressure medicines over a month ago and his pcp is currently checking bloodwork prior to starting him on new bp meds.  He had this blood drawn this morning before arriving here.  He denies headache, chest pain.  He also denies injury.  His plain film xray of his right hip obtained here last month was negative.    Past Medical History  Diagnosis Date  . Hypertension   . High cholesterol   . Myocardial infarction 1987    "slight one"  . Complication of anesthesia   . Stroke    Past Surgical History  Procedure Laterality Date  . Ankle surgery  2008    left ankle-otif-[facility]  . Mass excision  09/13/2012    Procedure: EXCISION MASS;  Surgeon: Jamesetta So, MD;  Location: [facility];  Service: General;  Laterality: N/A;   History reviewed. No pertinent family history. History  Substance Use Topics  . Smoking status: Current Every Day Smoker -- 0.25 packs/day for 30 years    Types: Cigarettes  . Smokeless tobacco: Not on file  . Alcohol Use: No    Review of Systems  Constitutional: Negative for fever.   Eyes: Negative.   Respiratory: Negative for chest tightness and shortness of breath.   Cardiovascular: Negative for chest pain, palpitations and leg swelling.  Gastrointestinal: Negative for nausea and abdominal pain.  Genitourinary: Negative.   Musculoskeletal: Positive for arthralgias. Negative for joint swelling.  Skin: Negative.   Neurological: Negative for dizziness, weakness, light-headedness, numbness and headaches.  Psychiatric/Behavioral: Negative.   All other systems reviewed and are negative.     Allergies  Poison ivy extract  Home Medications   Prior to Admission medications   Medication Sig Start Date End Date Taking? Authorizing Provider  Chromium Picolinate 1000 MCG TABS Take 1 tablet by mouth every morning.   Yes Historical Provider, MD  Omega-3 Fatty Acids (FISH OIL) 1000 MG CAPS Take 1 capsule by mouth every morning.   Yes Historical Provider, MD  benazepril (LOTENSIN) 10 MG tablet Take 10 mg by mouth daily.    Historical Provider, MD  glipiZIDE (GLUCOTROL) 10 MG tablet Take 10 mg by mouth daily.    Historical Provider, MD  hydrochlorothiazide (HYDRODIURIL) 25 MG tablet Take 1 tablet (25 mg total) by mouth daily. 08/31/13   Garvin Fila, MD  oxyCODONE-acetaminophen (PERCOCET) 10-325 MG per tablet Take 1 tablet by mouth every 6 (six) hours as needed for pain. 05/26/14   Evalee Jefferson, PA-C  simvastatin (ZOCOR) 20 MG  tablet Take 1 tablet (20 mg total) by mouth daily at 6 PM. 03/09/13   Donzetta Starch, NP   BP 167/110  Pulse 92  Temp(Src) 97.6 F (36.4 C) (Oral)  Resp 18  Ht 6\' 6"  (1.981 m)  Wt 280 lb (127.007 kg)  BMI 32.36 kg/m2  SpO2 99% Physical Exam  Nursing note and vitals reviewed. Constitutional: He appears well-developed and well-nourished.  HENT:  Head: Normocephalic and atraumatic.  Eyes: Conjunctivae are normal.  Neck: Normal range of motion.  Cardiovascular: Normal rate, regular rhythm, normal heart sounds and intact distal pulses.    Pulmonary/Chest: Effort normal and breath sounds normal. He has no wheezes.  Abdominal: Soft. Bowel sounds are normal. There is no tenderness.  Musculoskeletal: Normal range of motion. He exhibits tenderness. He exhibits no edema.  ttp right upper anterior thigh and over greaters trochanter.  Pain with internal and external ROM of right hip.  No crepitus.  Right knee nontender.  Neurological: He is alert.  Skin: Skin is warm and dry.  Psychiatric: He has a normal mood and affect.    ED Course  Procedures (including critical care time) Labs Review Labs Reviewed - No data to display  Imaging Review No results found.   EKG Interpretation None      MDM   Final diagnoses:  Chronic hip pain, right    Pt was given oxycodone here and prescription for same.  He was advised of elevated bp today and his need to f/u with his pcp as planned (appt in 4 days) to get him on appropriate tx.  Pt understands plan.  Prn f/u anticipated.    Evalee Jefferson, PA-C 05/29/14 1448

## 2014-05-30 ENCOUNTER — Other Ambulatory Visit (HOSPITAL_COMMUNITY): Payer: Self-pay | Admitting: Family Medicine

## 2014-05-30 DIAGNOSIS — M545 Low back pain: Secondary | ICD-10-CM

## 2014-06-03 NOTE — ED Provider Notes (Signed)
Medical screening examination/treatment/procedure(s) were performed by non-physician practitioner and as supervising physician I was immediately available for consultation/collaboration.   EKG Interpretation None      Medical screening examination/treatment/procedure(s) were performed by non-physician practitioner and as supervising physician I was immediately available for consultation/collaboration.   EKG Interpretation None        Maudry Diego, MD 06/03/14 [phone]

## 2014-06-06 ENCOUNTER — Ambulatory Visit (HOSPITAL_COMMUNITY): Payer: 59

## 2014-06-09 ENCOUNTER — Ambulatory Visit (HOSPITAL_COMMUNITY)
Admission: RE | Admit: 2014-06-09 | Discharge: 2014-06-09 | Disposition: A | Discharge status: Home / Self Care | Payer: 59 | Source: Ambulatory Visit | Attending: Family Medicine | Admitting: Family Medicine

## 2014-06-09 DIAGNOSIS — M48061 Spinal stenosis, lumbar region without neurogenic claudication: Secondary | ICD-10-CM | POA: Diagnosis not present

## 2014-06-09 DIAGNOSIS — M5126 Other intervertebral disc displacement, lumbar region: Secondary | ICD-10-CM | POA: Insufficient documentation

## 2014-06-09 DIAGNOSIS — M545 Low back pain: Secondary | ICD-10-CM

## 2014-06-09 DIAGNOSIS — M25569 Pain in unspecified knee: Secondary | ICD-10-CM | POA: Insufficient documentation

## 2014-06-09 DIAGNOSIS — M47817 Spondylosis without myelopathy or radiculopathy, lumbosacral region: Secondary | ICD-10-CM | POA: Diagnosis not present

## 2014-07-17 ENCOUNTER — Telehealth (HOSPITAL_COMMUNITY): Payer: Self-pay

## 2014-07-18 ENCOUNTER — Ambulatory Visit (HOSPITAL_COMMUNITY): Payer: Self-pay | Admitting: Physical Therapy

## 2014-07-25 ENCOUNTER — Ambulatory Visit (HOSPITAL_COMMUNITY)
Admission: RE | Admit: 2014-07-25 | Discharge: 2014-07-25 | Disposition: A | Discharge status: Home / Self Care | Payer: 59 | Source: Ambulatory Visit | Attending: Neurosurgery | Admitting: Neurosurgery

## 2014-07-25 DIAGNOSIS — IMO0001 Reserved for inherently not codable concepts without codable children: Secondary | ICD-10-CM | POA: Insufficient documentation

## 2014-07-25 DIAGNOSIS — R269 Unspecified abnormalities of gait and mobility: Secondary | ICD-10-CM | POA: Diagnosis not present

## 2014-07-25 DIAGNOSIS — M545 Low back pain, unspecified: Secondary | ICD-10-CM | POA: Diagnosis present

## 2014-07-25 DIAGNOSIS — R262 Difficulty in walking, not elsewhere classified: Secondary | ICD-10-CM | POA: Diagnosis not present

## 2014-07-25 DIAGNOSIS — M5441 Lumbago with sciatica, right side: Secondary | ICD-10-CM

## 2014-07-25 NOTE — Evaluation (Signed)
Physical Therapy Evaluation  Patient Details  Name: Raymond Barrera MRN: [id number] Date of Birth: 1961/01/06  Today's Date: 07/25/2014 Time: 4270-6237 PT Time Calculation (min): 43 min     Charges: 1 evaluation         Visit#: 1 of 24  Re-eval: 08/24/14 Assessment Diagnosis: Low back pain secondary to stiffness and weakness/  Next MD Visit: [name] Prior Therapy: no  Authorization: [employer] - No insurance coverage until 2000 is paid in deductible.      Past Medical History:  Past Medical History  Diagnosis Date  . Hypertension   . High cholesterol   . Myocardial infarction 1987    "slight one"  . Complication of anesthesia   . Stroke    Past Surgical History:  Past Surgical History  Procedure Laterality Date  . Ankle surgery  2008    left ankle-otif-[facility]  . Mass excision  09/13/2012    Procedure: EXCISION MASS;  Surgeon: Jamesetta So, MD;  Location: [facility];  Service: General;  Laterality: N/A;    Subjective Symptoms/Limitations Symptoms: Patient has a history of low back pain for multiple years. pain shot improved pain very briefly.  Pertinent History: MRI showed L4 - L5, L5 - S1 disc buldge and stenosis. 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  How long can you sit comfortably?: <5 minutes How long can you stand comfortably?: <5 minutes How long can you walk comfortably?: able to walk withotu difficulty once warmed up.  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  Pain Assessment Currently in Pain?: Yes Pain Score: 5  Pain Location: Back Pain Orientation: Right;Posterior Pain Type: Chronic pain Pain Radiating Towards: Rt and Lt anterior thigh pain Pain Onset: More than a month ago Pain Frequency:  Constant Pain Relieving Factors: walking, exercise, Effect of Pain on Daily Activities: sleeping in bed, waking up every hour  Sensation/Coordination/Flexibility/Functional Tests Flexibility Thomas: Positive 90/90: Positive Functional Tests Functional Tests: Patient unable to lay supine due to pback pain, only able to lay on Lt side without pain 10/10 Functional Tests: Patient unable to bar full weight on Rt hip/LE when sittign/standing due to 10/10 pain.   Assessment Lumbar AROM Lumbar Flexion: 75 degrees of pain Lumbar Extension: 7 degrees pain Lumbar - Right Side Bend: painful Lumbar - Left Side Bend: Painful Lumbar - Right Rotation: painful mor than Lt rotation Lumbar Strength Lumbar Flexion: 2+/5 Lumbar Extension: 2+/5  Exercise/Treatments Sagittal plane thoracic spine excursion with hands over chest 10x  Physical Therapy Assessment and Plan PT Assessment and Plan Clinical Impression Statement: Patient arrivs for therapy with primary complaint of extreme low back pain with radiating pain in bilateral anterior knees/thighs secondary to lumbar spine stenosis and disc pathologies. Examination was limited due to patient being 65minutes late for therapy and patient having 10/10 pain with laying supine and high pain at end range motions. Patient was unable to tolerate full weightbearign on Rt LE throughtou examination in all positions. Patient demosntrated decreased pain durign sagittal plane thoracic spine excursion noting "this is the best thing I have done in a long time." Patient will beenfit from skilled phsyical therapy to decrease pain and improve gait but  further examination will need to be performed to determine further direction with therapy.  Pt will benefit from skilled therapeutic intervention in order to improve on the following deficits: Abnormal gait;Decreased activity tolerance;Decreased balance;Decreased coordination;Difficulty walking;Decreased strength;Impaired  sensation;Pain;Impaired flexibility;Impaired perceived functional ability;Improper spinal/pelvic alignment;Improper body mechanics;Increased muscle spasms;Decreased range of motion;Decreased mobility;Increased fascial restricitons;Decreased knowledge of use of DME Rehab Potential: Fair Clinical Impairments Affecting Rehab Potential: Long history of low back pain, and other weakness secondaary to multiple strokes PT Frequency: Min 2X/week PT Duration: 12 weeks PT Treatment/Interventions: Gait training;DME instruction;Stair training;Functional mobility training;Therapeutic activities;Therapeutic exercise;Balance training;Neuromuscular re-education;Modalities;Manual techniques;Patient/family education PT Plan: Initial focus on findign pain relieving positions and improving mobility. As mobility improves focus to shift to increaisng strength so patient can return to work involving lifitng 20ld from the floor.     Goals Home Exercise Program Pt/caregiver will Perform Home Exercise Program: For increased ROM PT Short Term Goals Time to Complete Short Term Goals: 4 weeks PT Short Term Goal 1: Patient will be able to sit with pain <5/10 with equal LE weight bearing  >53minutes PT Short Term Goal 2: Patient will be able to stand with pain < 5/10 with equal LE weight bearing for >42minutes PT Short Term Goal 3: Patient will be able to sleep through night waking less than 5x PT Short Term Goal 4: patient will be able to flex trunk to 80 degrees to more easiiy pull up socks PT Short Term Goal 5: Patient will be able to extend trunk to 20 decrease to be able to look up required for painting to return to work PT Long Term Goals PT Long Term Goal 1: Patient will be able to sit with pain <3/10 with equal LE weight bearing  >67minutes PT Long Term Goal 2: Patient will be able to stand with pain < 3/10 with equal LE weight bearing for >19minutes Long Term Goal 3: Patient will be able to sleep through night waking  less than 3x Long Term Goal 4: patient will be able to flex trunk to >90 degrees to more easiiy tie shoes PT Long Term Goal 5: Patient will be able to extend trunk to 40 decrease to be able to look up required for painting to return to work Additional PT Long Term Goals?: Yes PT Long Term Goal 6: patient will be able to lift 20lb from the floror to indicate he is ready to return to work.   Problem List Patient Active Problem List   Diagnosis Date Noted  . Lumbago 07/25/2014  . Abnormality of gait 07/25/2014  . Difficulty in walking(719.7) 07/25/2014  . TIA (transient ischemic attack) 03/09/2013  . Cocaine abuse 03/09/2013  . Accelerated hypertension 03/09/2013  . Other and unspecified hyperlipidemia 03/09/2013  . Current smoker 03/09/2013    PT - End of Session Activity Tolerance: Patient tolerated treatment well General Behavior During Therapy: WFL for tasks assessed/performed  GP Functional Assessment Tool Used: FOTO: 56% limited (mobility)  Mahathi Pokorney R 07/25/2014, 7:18 PM  Physician Documentation Your signature is required to indicate approval of the treatment plan as stated above.  Please sign and either send electronically or make a copy of this report for your files and return this physician signed original.   Please mark one 1.__approve of plan  2. ___approve of plan with the following conditions.   ______________________________  _____________________ Physician Signature                                                                                                             Date

## 2014-07-27 ENCOUNTER — Ambulatory Visit (HOSPITAL_COMMUNITY)
Admission: RE | Admit: 2014-07-27 | Payer: 59 | Source: Ambulatory Visit | Attending: Neurosurgery | Admitting: Neurosurgery

## 2014-08-03 ENCOUNTER — Ambulatory Visit (HOSPITAL_COMMUNITY)
Admission: RE | Admit: 2014-08-03 | Discharge: 2014-08-03 | Disposition: A | Discharge status: Home / Self Care | Payer: 59 | Source: Ambulatory Visit | Attending: Neurosurgery | Admitting: Neurosurgery

## 2014-08-03 DIAGNOSIS — IMO0001 Reserved for inherently not codable concepts without codable children: Secondary | ICD-10-CM | POA: Diagnosis not present

## 2014-08-03 DIAGNOSIS — R262 Difficulty in walking, not elsewhere classified: Secondary | ICD-10-CM | POA: Insufficient documentation

## 2014-08-03 DIAGNOSIS — M545 Low back pain, unspecified: Secondary | ICD-10-CM | POA: Insufficient documentation

## 2014-08-03 DIAGNOSIS — R269 Unspecified abnormalities of gait and mobility: Secondary | ICD-10-CM | POA: Insufficient documentation

## 2014-08-03 NOTE — Progress Notes (Signed)
Physical Therapy Treatment Patient Details  Name: Raymond Barrera MRN: [id number] Date of Birth: 1961/08/14  Today's Date: 08/03/2014 Time: 0814-4818 PT Time Calculation (min): 37 min   Charges: TE [id number], manual [id number] Visit#: 2 of 24  Re-eval: 08/24/14 Assessment Diagnosis: Low back pain secondary to stiffness and weakness/  Next MD Visit: [name] Prior Therapy: no  Authorization: [employer] - No insurance coverage until 2000 is paid in deductible.    Subjective: Symptoms/Limitations Symptoms: Patient returns stating that he has been performing sagittal plane thoracic spine excurison Pain Assessment Currently in Pain?: Yes Pain Score: 4  Pain Location: Back Pain Orientation: Right;Posterior Pain Type: Chronic pain Pain Radiating Towards: Rt and Lt anterior thigh pain  Exercise/Treatments Stretches Lower Trunk Rotation: Limitations Lower Trunk Rotation Limitations: laying on Lt sidelowering Rt leg to table Standing Extension: 3 reps;Limitations Standing Extension Limitations: minor pain.  Standing Other Standing Lumbar Exercises: 3D hip excursions, limited extenion due to pain 10x Seated Other Seated Lumbar Exercises: 3D thoracic spine excursion 10x 3 seconds with Elbow drivers Other Seated Lumbar Exercises: Rt UE frontal plane over head reach reach 10x  Sidelying Other Sidelying Lumbar Exercises: Lt side laying Rt  UE back hand reach 2x 20 repetitions, Rt LE internal rotatio 20x 10 repetitons  Manual Therapy Manual Therapy: Other (comment) Other Manual Therapy: functional manual reaction techniques to increased Lumbar Rt Rotation, extension, and Lt side bend  Physical Therapy Assessment and Plan PT Assessment and Plan Clinical Impression Statement: This session focused on performans of pain relieving positions and pain relieving activitis that were found to include Lt side laying with Rt UE driver for runk Rt rotation and Rt LE internal rotation  for bottom up driver of Rt rotation. Both of these activities resulted in decreased pain and patient noting decreased radicular symptoms.  Patient also performed 3D thoracic spine excursions noting decreased pain when going into extension, Rt Rotation and Lt side bending.  Patient was given these exercises as part of HE to decrease pain. Patient performed3D hip excursions noting minimal pain except with performnce of hip extension attributed to limited hip flexor mobility  and limited trunk stability.  Patient displayed improved trunk rotation following manual facilitation of rotation with hande on Lumbar spine to promote lumbar spiner Rt rotation. PT Plan: Per pain tolerance attempt supine and or prone positions for progression of exercises. Focus to remain on decreasing pain.     Goals PT Short Term Goals PT Short Term Goal 1: Patient will be able to sit with pain <5/10 with equal LE weight bearing  >14minutes PT Short Term Goal 1 - Progress: Progressing toward goal PT Short Term Goal 2: Patient will be able to stand with pain < 5/10 with equal LE weight bearing for >65minutes PT Short Term Goal 2 - Progress: Progressing toward goal PT Short Term Goal 3: Patient will be able to sleep through night waking less than 5x PT Short Term Goal 3 - Progress: Progressing toward goal PT Short Term Goal 4: patient will be able to flex trunk to 80 degrees to more easiiy pull up socks PT Short Term Goal 4 - Progress: Progressing toward goal PT Short Term Goal 5: Patient will be able to extend trunk to 20 decrease to be able to look up required for painting to return to work PT Short Term Goal 5 - Progress: Progressing toward goal  Problem List Patient Active Problem List   Diagnosis Date Noted  . Lumbago 07/25/2014  .  Abnormality of gait 07/25/2014  . Difficulty in walking(719.7) 07/25/2014  . TIA (transient ischemic attack) 03/09/2013  . Cocaine abuse 03/09/2013  . Accelerated hypertension 03/09/2013   . Other and unspecified hyperlipidemia 03/09/2013  . Current smoker 03/09/2013    PT - End of Session Activity Tolerance: Patient tolerated treatment well General Behavior During Therapy: [facility] for tasks assessed/performed PT Plan of Care PT Home Exercise Plan: 3D thoracic spine excursions  GP    Lizzete Gough R 08/03/2014, 12:06 PM

## 2014-08-09 ENCOUNTER — Ambulatory Visit (HOSPITAL_COMMUNITY): Payer: 59 | Admitting: Physical Therapy

## 2014-08-09 ENCOUNTER — Telehealth (HOSPITAL_COMMUNITY): Payer: Self-pay

## 2014-08-11 ENCOUNTER — Inpatient Hospital Stay (HOSPITAL_COMMUNITY): Admission: RE | Admit: 2014-08-11 | Payer: Self-pay | Source: Ambulatory Visit | Admitting: Physical Therapy

## 2014-08-16 ENCOUNTER — Ambulatory Visit (HOSPITAL_COMMUNITY): Payer: 59 | Admitting: Physical Therapy

## 2014-08-17 ENCOUNTER — Telehealth (HOSPITAL_COMMUNITY): Payer: Self-pay

## 2014-08-17 NOTE — Telephone Encounter (Signed)
cx appointment - has a procedure scheduled

## 2014-08-18 ENCOUNTER — Ambulatory Visit (HOSPITAL_COMMUNITY): Payer: 59 | Admitting: Physical Therapy

## 2014-08-22 ENCOUNTER — Ambulatory Visit (HOSPITAL_COMMUNITY)
Admission: RE | Admit: 2014-08-22 | Payer: 59 | Source: Ambulatory Visit | Attending: Neurosurgery | Admitting: Neurosurgery

## 2014-08-24 ENCOUNTER — Ambulatory Visit (HOSPITAL_COMMUNITY)
Admission: RE | Admit: 2014-08-24 | Discharge: 2014-08-24 | Disposition: A | Discharge status: Home / Self Care | Payer: 59 | Source: Ambulatory Visit | Attending: Neurosurgery | Admitting: Neurosurgery

## 2014-08-24 DIAGNOSIS — IMO0001 Reserved for inherently not codable concepts without codable children: Secondary | ICD-10-CM | POA: Diagnosis not present

## 2014-08-24 NOTE — Progress Notes (Signed)
Physical Therapy Treatment Patient Details  Name: Raymond Barrera MRN: [id number] Date of Birth: 08/04/1961  Today's Date: 08/24/2014 Time: 1740-1820 PT Time Calculation (min): 40 min    Charges: TE [id number] Visit#: 3 of 24  Re-eval: 08/24/14 Assessment Diagnosis: Low back pain secondary to stiffness and weakness/  Next MD Visit: [name] Prior Therapy: no  Authorization: [employer] - No insurance coverage until 2000 is paid in deductible.    Subjective: Symptoms/Limitations Symptoms: Patient was unable to return to therapy until now due to having the flu. Pain Assessment Currently in Pain?: No/denies Pain Score: 7  Pain Location: Back Pain Orientation: Right;Posterior Pain Radiating Towards: Rt and Lt anterior thigh pain Pain Onset: More than a month ago Pain Frequency: Constant  Exercise/Treatments Stretches Lower Trunk Rotation: Limitations Lower Trunk Rotation Limitations: laying on Lt side and back lowering Rt leg to table 2 sets 10reps  Hip Flexor Stretch: 20 seconds;1 rep;Limitations Hip Flexor Stretch Limitations: to 8" also 10x 3" drives each side Standing Extension: 3 reps;Limitations Standing Extension Limitations: minor pain.  Standing Other Standing Lumbar Exercises: 3D hip excursions, limited extenion due to pain 10x Other Standing Lumbar Exercises: Rt LE forward, Rt  UE back hand reach 2x 20 repetitions, Rt LE internal rotatio 20x 10 repetitons Seated Other Seated Lumbar Exercises: 3D thoracic spine excursion 10x 3 seconds with Elbow drivers Other Seated Lumbar Exercises: Rt UE frontal plane over head reach reach 10x  Sidelying Other Sidelying Lumbar Exercises: Lt side laying Rt  UE back hand reach 2x 20 repetitions, Rt LE internal rotatio 20x 10 repetitons  Physical Therapy Assessment and Plan PT Assessment and Plan Clinical Impression Statement: Due to little progress made thus far secondary to patient getting the flu, this session  continued last sessions focus on finding pain relieving positions and pain relieving activities that were found to include Lt side laying with Rt UE driver for runk Rt rotation and Rt LE internal rotation for bottom up driver of Rt rotation. Both of these activities resulted in decreased pain and patient noting decreased radicular symptoms. Patient also performed 3D thoracic spine excursions noting decreased pain when going into extension, Rt Rotation and Lt side bending. Patient was given these exercises as part of HE to decrease pain. Patient performed3D hip excursions noting minimal pain except with performance of hip extension attributed to limited hip flexor mobility and limited trunk stability. Patient displayed improved trunk rotation following manual facilitation of rotation with hand on Lumbar spine to promote lumbar spine Rt rotation PT Plan: Per pain tolerance attempt supine and/or prone extension positions for progression of exercises. Focus to remain on decreasing pain.     Goals    Problem List Patient Active Problem List   Diagnosis Date Noted  . Lumbago 07/25/2014  . Abnormality of gait 07/25/2014  . Difficulty in walking(719.7) 07/25/2014  . TIA (transient ischemic attack) 03/09/2013  . Cocaine abuse 03/09/2013  . Accelerated hypertension 03/09/2013  . Other and unspecified hyperlipidemia 03/09/2013  . Current smoker 03/09/2013   GP    Lenisha Lacap R 08/24/2014, 6:39 PM

## 2014-08-28 ENCOUNTER — Ambulatory Visit (HOSPITAL_COMMUNITY): Payer: Self-pay | Admitting: Physical Therapy

## 2014-08-30 ENCOUNTER — Ambulatory Visit (HOSPITAL_COMMUNITY): Payer: Self-pay | Admitting: Physical Therapy

## 2014-09-04 ENCOUNTER — Ambulatory Visit (HOSPITAL_COMMUNITY)
Admission: RE | Admit: 2014-09-04 | Discharge: 2014-09-04 | Disposition: A | Discharge status: Home / Self Care | Payer: Medicaid Other | Source: Ambulatory Visit | Attending: Family Medicine | Admitting: Family Medicine

## 2014-09-04 DIAGNOSIS — R269 Unspecified abnormalities of gait and mobility: Secondary | ICD-10-CM | POA: Insufficient documentation

## 2014-09-04 DIAGNOSIS — I252 Old myocardial infarction: Secondary | ICD-10-CM | POA: Insufficient documentation

## 2014-09-04 DIAGNOSIS — Z720 Tobacco use: Secondary | ICD-10-CM | POA: Insufficient documentation

## 2014-09-04 DIAGNOSIS — R262 Difficulty in walking, not elsewhere classified: Secondary | ICD-10-CM | POA: Insufficient documentation

## 2014-09-04 DIAGNOSIS — I10 Essential (primary) hypertension: Secondary | ICD-10-CM | POA: Diagnosis not present

## 2014-09-04 DIAGNOSIS — Z5189 Encounter for other specified aftercare: Secondary | ICD-10-CM | POA: Diagnosis not present

## 2014-09-04 DIAGNOSIS — M6281 Muscle weakness (generalized): Secondary | ICD-10-CM | POA: Diagnosis not present

## 2014-09-04 DIAGNOSIS — M545 Low back pain: Secondary | ICD-10-CM | POA: Insufficient documentation

## 2014-09-04 NOTE — Progress Notes (Signed)
Physical Therapy Treatment Patient Details  Name: Raymond Barrera MRN: [id number] Date of Birth: 1961/06/28  Today's Date: 09/04/2014 Time: 1520-1558 PT Time Calculation (min): 38 min TE [id number]  Visit#: 4 of 24  Re-eval: 08/24/14     Subjective: Symptoms/Limitations Symptoms: Pt reports severe pain 9/10 on Rt side low back, hip, thigh since this morning.  Pt reports he woke up with pain, no change/increase in activity yesterday.  Pt reports he was awoken 4-5 times last night secondary to pain. Pain Assessment Currently in Pain?: Yes Pain Score: 9  Pain Location: Back Pain Orientation: Right;Lower  Exercise/Treatments Stretches Active Hamstring Stretch: 3 reps;20 seconds;Limitations Active Hamstring Stretch Limitations: Press photographer on Slantboard Passive Hamstring Stretch: 3 reps;20 seconds;Limitations Passive Hamstring Stretch Limitations: 12" Step Hip Flexor Stretch: 3 reps;20 seconds Hip Flexor Stretch Limitations: 12" Step Standing Extension: 5 reps;Limitations Standing Extension Limitations: 5 reps x2 Piriformis Stretch: 3 reps;20 seconds;Limitations Piriformis Stretch Limitations: Seated Standing Other Standing Lumbar Exercises: 3D hip excursions, limited extenion due to pain 10x Seated Other Seated Lumbar Exercises: 3D thoracic spine excursion 10x 3 seconds with Elbow drivers  Modalities Modalities: Moist Heat Moist Heat Therapy Number Minutes Moist Heat: 10 Minutes Moist Heat Location: Other (comment) (Low back, during seated therapeutic exercises)  Physical Therapy Assessment and Plan PT Assessment and Plan Clinical Impression Statement: Pt reports severe complaints of pain on the Rt side low back, hip, thigh region prior to treatment, with notable antalgic gait patterning despite use of std cane.  Pt completed therapuetic exercise program, with more stretching felt on Rt > Lt side, though tightness noted on both sides.  Pt reported pain with Rt rotation  today with throacic and lumbar excursion exercise.  MHP was applied during seated exercises to decrease muscle tone and loosen muscles during exercises.  Pt reports feeling "pretty good" by end of treatment session.  Pt will benefit from skilled therapeutic intervention in order to improve on the following deficits: Abnormal gait;Decreased activity tolerance;Decreased balance;Decreased coordination;Difficulty walking;Decreased strength;Impaired sensation;Pain;Impaired flexibility;Impaired perceived functional ability;Improper spinal/pelvic alignment;Improper body mechanics;Increased muscle spasms;Decreased range of motion;Decreased mobility;Increased fascial restricitons;Decreased knowledge of use of DME PT Plan: Re-assess next visit.     Goals PT Short Term Goals PT Short Term Goal 1: Patient will be able to sit with pain <5/10 with equal LE weight bearing  >11minutes PT Short Term Goal 1 - Progress: Progressing toward goal PT Short Term Goal 2: Patient will be able to stand with pain < 5/10 with equal LE weight bearing for >42minutes PT Short Term Goal 2 - Progress: Progressing toward goal PT Short Term Goal 4: patient will be able to flex trunk to 80 degrees to more easiiy pull up socks PT Short Term Goal 4 - Progress: Progressing toward goal  Problem List Patient Active Problem List   Diagnosis Date Noted  . Lumbago 07/25/2014  . Abnormality of gait 07/25/2014  . Difficulty in walking(719.7) 07/25/2014  . TIA (transient ischemic attack) 03/09/2013  . Cocaine abuse 03/09/2013  . Accelerated hypertension 03/09/2013  . Other and unspecified hyperlipidemia 03/09/2013  . Current smoker 03/09/2013    PT - End of Session Activity Tolerance: Patient tolerated treatment well General Behavior During Therapy: [facility] for tasks assessed/performed   Brannon Decaire 09/04/2014, 4:01 PM

## 2014-09-05 ENCOUNTER — Ambulatory Visit (HOSPITAL_COMMUNITY)
Admission: RE | Admit: 2014-09-05 | Discharge: 2014-09-05 | Disposition: A | Discharge status: Home / Self Care | Payer: Medicaid Other | Source: Ambulatory Visit | Attending: Family Medicine | Admitting: Family Medicine

## 2014-09-05 DIAGNOSIS — Z5189 Encounter for other specified aftercare: Secondary | ICD-10-CM | POA: Diagnosis not present

## 2014-09-05 NOTE — Evaluation (Signed)
Physical Therapy Re-Evaluation  Patient Details  Name: Raymond Barrera MRN: [id number] Date of Birth: 20-Sep-1961  Today's Date: 09/05/2014 Time: 1829-9371 PT Time Calculation (min): 31 min   TE 1559-1610, ROM 1610-1630            Visit#: 5 of 24  Re-eval: 10/03/14 Assessment Diagnosis: Low back pain secondary to stiffness and weakness/  Next MD Visit: [name]   Past Medical History:  Past Medical History  Diagnosis Date  . Hypertension   . High cholesterol   . Myocardial infarction 1987    "slight one"  . Complication of anesthesia   . Stroke    Past Surgical History:  Past Surgical History  Procedure Laterality Date  . Ankle surgery  2008    left ankle-otif-[facility]  . Mass excision  09/13/2012    Procedure: EXCISION MASS;  Surgeon: Jamesetta So, MD;  Location: [facility];  Service: General;  Laterality: N/A;    Subjective Symptoms/Limitations Symptoms: Pt reports he is "feeling pretty good today" How long can you sit comfortably?: Pt reports he is able to sit for 10-15 minutes, though does report shifting to Lt side after ~5 minutes (was <5 minutes)  Pain Assessment Currently in Pain?: Yes (Worst - 9/10, Best 3/10) Pain Score: 3  Pain Location: Back Pain Orientation: Right;Lower Pain Type: Chronic pain  Sensation/Coordination/Flexibility/Functional Tests Flexibility Thomas: Positive 90/90: Positive Functional Tests Functional Tests: FOTO Status 55%, Limitation 45% ((was status 43%, limitation 57%))  Assessment Lumbar AROM Lumbar Flexion: 75 degrees of pain ((was 75 degrees)) Lumbar Extension: 20 degrees ((was 7 degrees)) Lumbar - Right Side Bend: 28 1/4 inch fingertip to floor with pain Lumbar - Left Side Bend: 26 inch fingertip to floor no pain (was painful) Lumbar - Right Rotation: Painful (was painful) Lumbar - Left Rotation: No pain Lumbar Strength Lumbar Flexion: 2+/5 Lumbar Extension: 2+/5  Exercise/Treatments Stretches Active Hamstring  Stretch: 3 reps;30 seconds Active Hamstring Stretch Limitations: Press photographer on Slantboard Passive Hamstring Stretch: 3 reps;30 seconds Passive Hamstring Stretch Limitations: 12" Step Piriformis Stretch: 3 reps;30 seconds Piriformis Stretch Limitations: Seated Standing Other Standing Lumbar Exercises: 3D Hip Excursions  Physical Therapy Assessment and Plan PT Assessment and Plan Clinical Impression Statement: Re-assessment completed.  Pt demonstrates mild improvements in ROM, with improved lumbar extension and left lateral flexion without pain.  Overall, limited progress made as pt has only attended 4 visits since initial evaluation 4 weeks ago.  Educated pt on importance of compliance with therapy sessions to increase ROM, strength, and functional mobility to decrease pain; pt reports understanding and has scheduled visits 2x/wk for the next 4 weeks.  Pt did report pain after last treatment session with new stretches, though pt was able to sleep through the night without being awoke (though reports he took pain medicaiton prior to sleeping), and presents to PT with pain at 3/10 (lowest value in 4 weeks).  Functionally, noted decreased limitation score on FOTO to 45%, indicating pt reports improving functional skills since onset of PT. Pt reports improving tolerance with sitting and standing, though does still shift weight to Lt to decrease WB on the Rt secondary to pain.   At this time, pt has currently met 1/5 STG.  Recommend continued PT 2/wk for 4 weeks to progress therapeutic exercise program and increase functional mobility skills.   Pt will benefit from skilled therapeutic intervention in order to improve on the following deficits: Abnormal gait;Decreased activity tolerance;Decreased balance;Decreased coordination;Difficulty walking;Decreased strength;Impaired sensation;Pain;Impaired flexibility;Impaired perceived functional ability;Improper  spinal/pelvic alignment;Improper body  mechanics;Increased muscle spasms;Decreased range of motion;Decreased mobility;Increased fascial restricitons;Decreased knowledge of use of DME Rehab Potential: Fair Clinical Impairments Affecting Rehab Potential: Long history of low back pain, and other weakness secondaary to multiple strokes PT Frequency: Min 2X/week PT Duration: 8 weeks PT Treatment/Interventions: Gait training;DME instruction;Stair training;Functional mobility training;Therapeutic activities;Therapeutic exercise;Balance training;Neuromuscular re-education;Modalities;Manual techniques;Patient/family education PT Plan: Continue exercise program with stretching and strengthing, working on equal WB with activities (sitting, transfers) to decrease compensations.     Goals PT Short Term Goals Time to Complete Short Term Goals: 4 weeks PT Short Term Goal 1: Patient will be able to sit with pain <5/10 with equal LE weight bearing  >3minutes (Pt is able to sit for > 10 minutes, though does shift to the Lt after ~5 minutes) PT Short Term Goal 2: Patient will be able to stand with pain < 5/10 with equal LE weight bearing for >61minutes PT Short Term Goal 2 - Progress: Progressing toward goal PT Short Term Goal 3: Patient will be able to sleep through night waking less than 5x (Pt was awoken 4-5 times a night on sunday, 0 times on monday) PT Short Term Goal 4: patient will be able to flex trunk to 80 degrees to more easiiy pull up socks PT Short Term Goal 4 - Progress: Progressing toward goal PT Short Term Goal 5: Patient will be able to extend trunk to 20 decrease to be able to look up required for painting to return to work PT Short Term Goal 5 - Progress: Met PT Long Term Goals PT Long Term Goal 1: Patient will be able to sit with pain <3/10 with equal LE weight bearing  >30minutes PT Long Term Goal 1 - Progress: Progressing toward goal PT Long Term Goal 2: Patient will be able to stand with pain < 3/10 with equal LE weight bearing  for >48minutes PT Long Term Goal 2 - Progress: Progressing toward goal Long Term Goal 3: Patient will be able to sleep through night waking less than 3x Long Term Goal 3 Progress: Progressing toward goal Long Term Goal 4: patient will be able to flex trunk to >90 degrees to more easiiy tie shoes Long Term Goal 4 Progress: Progressing toward goal PT Long Term Goal 5: Patient will be able to extend trunk to 40 decrease to be able to look up required for painting to return to work Long Term Goal 5 Progress: Progressing toward goal PT Long Term Goal 6: patient will be able to lift 20lb from the floror to indicate he is ready to return to work.   Problem List Patient Active Problem List   Diagnosis Date Noted  . Lumbago 07/25/2014  . Abnormality of gait 07/25/2014  . Difficulty in walking(719.7) 07/25/2014  . TIA (transient ischemic attack) 03/09/2013  . Cocaine abuse 03/09/2013  . Accelerated hypertension 03/09/2013  . Other and unspecified hyperlipidemia 03/09/2013  . Current smoker 03/09/2013    PT - End of Session Activity Tolerance: Patient tolerated treatment well;Patient limited by pain General Behavior During Therapy: [facility] for tasks assessed/performed  GP Functional Assessment Tool Used: FOTO: 45% limited (mobility)  Aveya Beal 09/05/2014, 4:41 PM  Physician Documentation Your signature is required to indicate approval of the treatment plan as stated above.  Please sign and either send electronically or make a copy of this report for your files and return this physician signed original.   Please mark one 1.__approve of plan  2. ___approve of plan with the following conditions.  ______________________________                                                          _____________________ Physician Signature                                                                                                             Date

## 2014-09-12 ENCOUNTER — Ambulatory Visit (HOSPITAL_COMMUNITY)
Admission: RE | Admit: 2014-09-12 | Discharge: 2014-09-12 | Disposition: A | Discharge status: Home / Self Care | Payer: Medicaid Other | Source: Ambulatory Visit | Attending: Family Medicine | Admitting: Family Medicine

## 2014-09-12 DIAGNOSIS — Z5189 Encounter for other specified aftercare: Secondary | ICD-10-CM | POA: Diagnosis not present

## 2014-09-12 NOTE — Progress Notes (Signed)
Physical Therapy Treatment Patient Details  Name: Raymond Barrera MRN: [id number] Date of Birth: 03/28/1961  Today's Date: 09/12/2014 Time: 7846-9629 PT Time Calculation (min): 32 min TE 5284-1324  Visit#: 6 of 24  Re-eval: 10/03/14 Assessment Diagnosis: Low back pain secondary to stiffness and weakness/  Next MD Visit: [name]   Subjective: Symptoms/Limitations Symptoms: Pt reports severe complaints of pain today, with pain 10/10 this morning upon waking and with pain medication now 8/10. Pt reprots he did walk a mile to the store and back yesterday with a seated rest break in between.   Pain Assessment Currently in Pain?: Yes Pain Score: 8  Pain Location: Back Pain Orientation: Lower;Right Pain Radiating Towards: Rt groin area   Exercise/Treatments Stretches Active Hamstring Stretch: 3 reps;30 seconds Active Hamstring Stretch Limitations: Press photographer on Slantboard Passive Hamstring Stretch: 3 reps;30 seconds Passive Hamstring Stretch Limitations: 14" Box Hip Flexor Stretch: 3 reps;30 seconds Hip Flexor Stretch Limitations: 12" Step Piriformis Stretch: 3 reps;30 seconds Piriformis Stretch Limitations: Seated Standing Other Standing Lumbar Exercises: 3D Hip Excursions Other Standing Lumbar Exercises: Hip Ext/Abd x10  Physical Therapy Assessment and Plan PT Assessment and Plan Clinical Impression Statement: Pt reports increased complaints of pain today, and therapy session was limited to pt tolerance.  Pt reported difficulty with transfers sit -> stand and standing on Rt LE during standing exercises; pt was able to complete 10 reps of standing exercises after breaking down into 2 sets of 5.  Pt does demonstrate improving HS length as he was able to tolerate increased height of box today.  Pt will benefit from skilled therapeutic intervention in order to improve on the following deficits: Abnormal gait;Decreased activity tolerance;Decreased balance;Decreased  coordination;Difficulty walking;Decreased strength;Impaired sensation;Pain;Impaired flexibility;Impaired perceived functional ability;Improper spinal/pelvic alignment;Improper body mechanics;Increased muscle spasms;Decreased range of motion;Decreased mobility;Increased fascial restricitons;Decreased knowledge of use of DME PT Plan: Continue exercise program with stretching and strengthing, working on equal WB with activities (sitting, transfers) to decrease compensations. Possible attempt at TENS use for pain management.     Goals PT Short Term Goals PT Short Term Goal 3: Patient will be able to sleep through night waking less than 5x PT Short Term Goal 3 - Progress: Progressing toward goal PT Short Term Goal 4: patient will be able to flex trunk to 80 degrees to more easiiy pull up socks PT Short Term Goal 4 - Progress: Progressing toward goal PT Short Term Goal 5: Patient will be able to extend trunk to 20 decrease to be able to look up required for painting to return to work PT Short Term Goal 5 - Progress: Met  Problem List Patient Active Problem List   Diagnosis Date Noted  . Lumbago 07/25/2014  . Abnormality of gait 07/25/2014  . Difficulty in walking(719.7) 07/25/2014  . TIA (transient ischemic attack) 03/09/2013  . Cocaine abuse 03/09/2013  . Accelerated hypertension 03/09/2013  . Other and unspecified hyperlipidemia 03/09/2013  . Current smoker 03/09/2013    PT - End of Session Activity Tolerance: Patient tolerated treatment well;Patient limited by pain General Behavior During Therapy: [facility] for tasks assessed/performed   Raymond Barrera 09/12/2014, 4:36 PM

## 2014-09-14 ENCOUNTER — Ambulatory Visit (HOSPITAL_COMMUNITY)
Admission: RE | Admit: 2014-09-14 | Discharge: 2014-09-14 | Disposition: A | Discharge status: Home / Self Care | Payer: Medicaid Other | Source: Ambulatory Visit | Attending: Neurosurgery | Admitting: Neurosurgery

## 2014-09-14 DIAGNOSIS — Z5189 Encounter for other specified aftercare: Secondary | ICD-10-CM | POA: Diagnosis not present

## 2014-09-14 NOTE — Progress Notes (Signed)
Physical Therapy Treatment Patient Details  Name: Raymond Barrera MRN: [id number] Date of Birth: [birth year]  Today's Date: 09/14/2014 Time: 1031-5945 PT Time Calculation (min): 44 min  Charges: TE [id number] Visit#: 7 of 24  Re-eval: 10/03/14 Assessment Diagnosis: Low back pain secondary to stiffness and weakness/  Next MD Visit: [name]  Authorization: [place] care - No insurance coverage until 2000 is paid in deductible.    Subjective: Symptoms/Limitations Symptoms: Pt reports severe complaints of pain today 9/10, with pain 10/10 last night. Pt reprots he has been working on his car installing spark plugs. Patient states that he fell last night resulting in crying all night and increased pain in his back. n is in my groind" but Points to hip flexors.and rectus femoris.  Patient noted conitnued pain throughout session and  was unable to state he had any improvement in symptoms.  Pain Assessment Currently in Pain?: Yes Pain Score: 9   Exercise/Treatments Stretches Passive Hamstring Stretch: 3 reps;30 seconds Passive Hamstring Stretch Limitations: 14" Box Hip Flexor Stretch: 3 reps;30 seconds Hip Flexor Stretch Limitations: 12" Step Piriformis Stretch: 3 reps;30 seconds Piriformis Stretch Limitations: Seated Standing Other Standing Lumbar Exercises: split stance hip excursion to 12" box with Lt LE forward x10 Seated Other Seated Lumbar Exercises: 3D thoracic spine excursion 10x 3 seconds with Elbow drivers Other Seated Lumbar Exercises: Rt UE frontal plane over head reach reach 10x   Physical Therapy Assessment and Plan PT Assessment and Plan Clinical Impression Statement: Patient's report of folling indicates possible fracture or other pathology causing pain in low back and Rt hip pain. Patient displayed no improvement in pain despite initial minimal improvement with rectus femoris myofascial release. Pain significantly increased with weight bearing in Rt  groin/femur. Patient was instructed to call referrign MD immediately  after session to be evaluated for possible hip and or spine fracture follwoing traumatic fall on concrete floor.   PT Plan: Continue exercise program with stretching and strengthing, working on equal WB with activities (sitting, transfers) to decrease compensations pending patient return if cleared by MD    Goals PT Short Term Goals PT Short Term Goal 1: Patient will be able to sit with pain <5/10 with equal LE weight bearing  >58mnutes PT Short Term Goal 1 - Progress: Progressing toward goal PT Short Term Goal 2: Patient will be able to stand with pain < 5/10 with equal LE weight bearing for >143mutes PT Short Term Goal 2 - Progress: Progressing toward goal PT Short Term Goal 3: Patient will be able to sleep through night waking less than 5x PT Short Term Goal 3 - Progress: Progressing toward goal PT Short Term Goal 4: patient will be able to flex trunk to 80 degrees to more easiiy pull up socks PT Short Term Goal 4 - Progress: Progressing toward goal PT Short Term Goal 5: Patient will be able to extend trunk to 20 decrease to be able to look up required for painting to return to work PT Short Term Goal 5 - Progress: Met  Problem List Patient Active Problem List   Diagnosis Date Noted  . Lumbago 07/25/2014  . Abnormality of gait 07/25/2014  . Difficulty in walking(719.7) 07/25/2014  . TIA (transient ischemic attack) 03/09/2013  . Cocaine abuse 03/09/2013  . Accelerated hypertension 03/09/2013  . Other and unspecified hyperlipidemia 03/09/2013  . Current smoker 03/09/2013    PT - End of Session Activity Tolerance: Patient tolerated treatment well;Patient limited by pain General Behavior During Therapy: [facility]  for tasks assessed/performed  GP    Mayar Whittier R 09/14/2014, 7:11 PM

## 2014-09-19 ENCOUNTER — Ambulatory Visit (HOSPITAL_COMMUNITY)
Admission: RE | Admit: 2014-09-19 | Discharge: 2014-09-19 | Disposition: A | Discharge status: Home / Self Care | Payer: Medicaid Other | Source: Ambulatory Visit | Attending: Family Medicine | Admitting: Family Medicine

## 2014-09-19 DIAGNOSIS — Z5189 Encounter for other specified aftercare: Secondary | ICD-10-CM | POA: Diagnosis not present

## 2014-09-19 NOTE — Progress Notes (Signed)
Physical Therapy Treatment Patient Details  Name: Raymond Barrera MRN: [id number] Date of Birth: [birth year]  Today's Date: 09/19/2014 Time: 4431-5400 PT Time Calculation (min): 46 min   Charges: TE [id number] Visit#: 8 of 24  Re-eval: 10/03/14 Assessment Diagnosis: Low back pain secondary to stiffness and weakness/  Next MD Visit: [name]  Authorization: [place] care - No insurance coverage until 2000 is paid in deductible.    Subjective: Symptoms/Limitations Symptoms: Patient states he feels he is making some improvements but that he is still having pain and hat afterwards it will come right back if he sits too long.  Pain Assessment Currently in Pain?: Yes Pain Score: 9  Pain Location: Back Pain Orientation: Lower;Right Pain Type: Chronic pain Pain Onset: More than a month ago Pain Frequency: Constant  Exercise/Treatments Stretches Passive Hamstring Stretch: 3 reps;30 seconds Passive Hamstring Stretch Limitations: 14" Box Hip Flexor Stretch: 3 reps;30 seconds Hip Flexor Stretch Limitations: 12" Step Piriformis Stretch: 3 reps;30 seconds Piriformis Stretch Limitations: Seated Standing Row: 10 reps;Strengthening;Theraband Theraband Level (Row): Level 2 (Red) Other Standing Lumbar Exercises: 3D Hip Excursions Other Standing Lumbar Exercises: split stance hip excursion to 12" box with Lt LE forward x10 Seated Other Seated Lumbar Exercises: 3D thoracic spine excursion 10x 3 seconds with Elbow drivers Other Seated Lumbar Exercises: Rt UE frontal plane over head reach reach 10x   Physical Therapy Assessment and Plan PT Assessment and Plan Clinical Impression Statement: Patient was educated in performing his HEP exercises daily to improve pain improvement as he notes that he feels better when performing his HEP but does not regularly perform his home exercises. Patient stated at end of session that he will do a better job keeping up with his HEP and wioll  perform his exercises daily. Patient noted specifically pain relief with stanidng extension based exercises resulting in decreased hip pain. Though noting resulted in improved hip?groin pain on Rt PT Plan: Continue exercise program with stretching and strengthingoh Lumbar extension, Reassess Rt LE hip pain.     Goals PT Short Term Goals PT Short Term Goal 1: Patient will be able to sit with pain <5/10 with equal LE weight bearing  >26minutes PT Short Term Goal 1 - Progress: Progressing toward goal PT Short Term Goal 2: Patient will be able to stand with pain < 5/10 with equal LE weight bearing for >42minutes PT Short Term Goal 2 - Progress: Progressing toward goal PT Short Term Goal 3: Patient will be able to sleep through night waking less than 5x PT Short Term Goal 3 - Progress: Progressing toward goal PT Short Term Goal 4: patient will be able to flex trunk to 80 degrees to more easiiy pull up socks PT Short Term Goal 4 - Progress: Progressing toward goal  Problem List Patient Active Problem List   Diagnosis Date Noted  . Lumbago 07/25/2014  . Abnormality of gait 07/25/2014  . Difficulty in walking(719.7) 07/25/2014  . TIA (transient ischemic attack) 03/09/2013  . Cocaine abuse 03/09/2013  . Accelerated hypertension 03/09/2013  . Other and unspecified hyperlipidemia 03/09/2013  . Current smoker 03/09/2013    PT - End of Session Activity Tolerance: Patient tolerated treatment well;Patient limited by pain General Behavior During Therapy: [facility] for tasks assessed/performed  GP    Lulie Hurd R 09/19/2014, 6:49 PM

## 2014-09-22 ENCOUNTER — Ambulatory Visit (HOSPITAL_COMMUNITY)
Admission: RE | Admit: 2014-09-22 | Discharge: 2014-09-22 | Disposition: A | Discharge status: Home / Self Care | Payer: Medicaid Other | Source: Ambulatory Visit | Attending: Neurosurgery | Admitting: Neurosurgery

## 2014-09-22 DIAGNOSIS — Z5189 Encounter for other specified aftercare: Secondary | ICD-10-CM | POA: Diagnosis not present

## 2014-09-22 NOTE — Progress Notes (Signed)
Physical Therapy Treatment Patient Details  Name: Raymond Barrera MRN: [id number] Date of Birth: 05-20-1961  Today's Date: 09/22/2014 Time: 6333-5456 PT Time Calculation (min): 46 min   Charges: TE: [id number], Manual 1745-1800, Self care [phone] Visit#: 9 of 24  Re-eval: 10/03/14 Assessment Diagnosis: Low back pain secondary to stiffness and weakness/  Next MD Visit: [name]  Authorization: [place] care - No insurance coverage until 2000 is paid in deductible.    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 Patient notes hat he routinely feels good following therapy but his pain returns several hours later.  Pain Assessment Currently in Pain?: Yes Pain Score: 9  Pain Location: Back Pain Orientation: Lower;Right Pain Type: Chronic pain  Exercise/Treatments Stretches Passive Hamstring Stretch: 3 reps;30 seconds Passive Hamstring Stretch Limitations: 14" Box Hip Flexor Stretch: 3 reps;30 seconds Hip Flexor Stretch Limitations: 12" Step Piriformis Stretch: 3 reps;30 seconds Piriformis Stretch Limitations: Seated  Manual Therapy Manual Therapy: Myofascial release Joint Mobilization: Grade 3 and 4 fermoral pelvic medial lateral mobilizations and posterior to anterior mobilizations.  Myofascial Release: Soft tissue mobilization of erector spinae muscles.  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 Patient's lumbar spine pain  made no improvmentys in pain this session despite correction of hip mis alignment (which did bring about temporary improvment in symptoms) or hip joint mobilizations (whcih resulted in at least temporary complete relief of groin pain.  Patient and wife were educated on severity of diagnosis, other treatment options, and the conitnued plan for therapy.  PT Plan: Pending pain, continue exercise program with stretching and strengthing on Lumbar extension, Reassess Rt LE hip pain.     Goals PT Short Term Goals PT Short Term Goal 1: Patient will be able to sit with pain <5/10 with equal LE weight bearing  >57minutes PT Short Term Goal 1 - Progress: Progressing toward goal PT Short Term Goal 2: Patient will be able to stand with pain < 5/10 with equal LE weight bearing for >70minutes PT Short Term Goal 2 - Progress: Progressing toward goal PT Short Term Goal 3: Patient will be able to sleep through night waking less than 5x PT Short Term Goal 3 - Progress: Progressing toward goal PT Short Term Goal 4: patient will be able to flex trunk to 80 degrees to more easiiy pull up socks PT Short Term Goal 4 - Progress: Progressing toward goal PT Short Term Goal 5: Patient will be able to extend trunk to 20 decrease to be able to look up required for painting to return to work PT Short Term Goal 5 - Progress: Met  Problem List Patient Active Problem List   Diagnosis Date Noted  . Lumbago 07/25/2014  . Abnormality of gait 07/25/2014  . Difficulty in walking(719.7) 07/25/2014  . TIA (transient ischemic attack) 03/09/2013  . Cocaine abuse 03/09/2013  . Accelerated hypertension 03/09/2013  . Other and unspecified hyperlipidemia 03/09/2013  . Current smoker 03/09/2013    PT - End of Session Activity Tolerance: Patient tolerated treatment well;Patient limited by pain General Behavior  During Therapy: [facility] for tasks assessed/performed  GP    Daishaun Ayre R 09/22/2014, 7:10 PM

## 2014-09-25 ENCOUNTER — Telehealth (HOSPITAL_COMMUNITY): Payer: Self-pay

## 2014-09-25 ENCOUNTER — Other Ambulatory Visit (HOSPITAL_COMMUNITY): Payer: Self-pay | Admitting: Neurosurgery

## 2014-09-25 NOTE — Telephone Encounter (Signed)
All appointments cancelled - he is having surgery Nov. 17

## 2014-09-26 ENCOUNTER — Ambulatory Visit (HOSPITAL_COMMUNITY)
Admission: RE | Admit: 2014-09-26 | Payer: Medicaid Other | Source: Ambulatory Visit | Attending: Family Medicine | Admitting: Family Medicine

## 2014-09-28 ENCOUNTER — Ambulatory Visit (HOSPITAL_COMMUNITY): Payer: Self-pay | Admitting: Physical Therapy

## 2014-10-02 ENCOUNTER — Ambulatory Visit (HOSPITAL_COMMUNITY): Payer: Self-pay | Admitting: Physical Therapy

## 2014-10-05 ENCOUNTER — Ambulatory Visit (HOSPITAL_COMMUNITY): Payer: Self-pay | Admitting: Physical Therapy

## 2014-10-09 ENCOUNTER — Ambulatory Visit (HOSPITAL_COMMUNITY): Payer: Self-pay | Admitting: Physical Therapy

## 2014-10-09 ENCOUNTER — Encounter (HOSPITAL_COMMUNITY)
Admission: RE | Admit: 2014-10-09 | Discharge: 2014-10-09 | Disposition: A | Discharge status: Home / Self Care | Payer: Medicaid Other | Source: Ambulatory Visit | Attending: Neurosurgery | Admitting: Neurosurgery

## 2014-10-09 ENCOUNTER — Encounter (HOSPITAL_COMMUNITY): Payer: Self-pay

## 2014-10-09 DIAGNOSIS — I252 Old myocardial infarction: Secondary | ICD-10-CM | POA: Diagnosis not present

## 2014-10-09 DIAGNOSIS — Z8673 Personal history of transient ischemic attack (TIA), and cerebral infarction without residual deficits: Secondary | ICD-10-CM | POA: Diagnosis not present

## 2014-10-09 DIAGNOSIS — Z79899 Other long term (current) drug therapy: Secondary | ICD-10-CM | POA: Diagnosis not present

## 2014-10-09 DIAGNOSIS — F17210 Nicotine dependence, cigarettes, uncomplicated: Secondary | ICD-10-CM | POA: Diagnosis not present

## 2014-10-09 DIAGNOSIS — N189 Chronic kidney disease, unspecified: Secondary | ICD-10-CM | POA: Diagnosis not present

## 2014-10-09 DIAGNOSIS — Z7982 Long term (current) use of aspirin: Secondary | ICD-10-CM | POA: Diagnosis not present

## 2014-10-09 DIAGNOSIS — M4726 Other spondylosis with radiculopathy, lumbar region: Secondary | ICD-10-CM | POA: Diagnosis not present

## 2014-10-09 DIAGNOSIS — I129 Hypertensive chronic kidney disease with stage 1 through stage 4 chronic kidney disease, or unspecified chronic kidney disease: Secondary | ICD-10-CM | POA: Diagnosis not present

## 2014-10-09 DIAGNOSIS — M1990 Unspecified osteoarthritis, unspecified site: Secondary | ICD-10-CM | POA: Diagnosis not present

## 2014-10-09 DIAGNOSIS — E119 Type 2 diabetes mellitus without complications: Secondary | ICD-10-CM | POA: Diagnosis not present

## 2014-10-09 DIAGNOSIS — M4806 Spinal stenosis, lumbar region: Secondary | ICD-10-CM | POA: Diagnosis not present

## 2014-10-09 HISTORY — DX: Type 2 diabetes mellitus without complications: E11.9

## 2014-10-09 HISTORY — DX: Pneumonia, unspecified organism: J18.9

## 2014-10-09 HISTORY — DX: Unspecified osteoarthritis, unspecified site: M19.90

## 2014-10-09 LAB — CBC
HEMATOCRIT: 38.6 % — AB (ref 39.0–52.0)
HEMOGLOBIN: 13.5 g/dL (ref 13.0–17.0)
MCH: 30.4 pg (ref 26.0–34.0)
MCHC: 35 g/dL (ref 30.0–36.0)
MCV: 86.9 fL (ref 78.0–100.0)
Platelets: 215 10*3/uL (ref 150–400)
RBC: 4.44 MIL/uL (ref 4.22–5.81)
RDW: 13.5 % (ref 11.5–15.5)
WBC: 8.8 10*3/uL (ref 4.0–10.5)

## 2014-10-09 LAB — BASIC METABOLIC PANEL
Anion gap: 11 (ref 5–15)
BUN: 11 mg/dL (ref 6–23)
CALCIUM: 10 mg/dL (ref 8.4–10.5)
CHLORIDE: 105 meq/L (ref 96–112)
CO2: 26 meq/L (ref 19–32)
CREATININE: 1.1 mg/dL (ref 0.50–1.35)
GFR calc Af Amer: 86 mL/min — ABNORMAL LOW (ref >90)
GFR calc non Af Amer: 74 mL/min — ABNORMAL LOW (ref >90)
GLUCOSE: 94 mg/dL (ref 70–99)
Potassium: 4.5 mEq/L (ref 3.7–5.3)
SODIUM: 142 meq/L (ref 137–147)

## 2014-10-09 LAB — SURGICAL PCR SCREEN
MRSA, PCR: NEGATIVE
Staphylococcus aureus: NEGATIVE

## 2014-10-09 NOTE — Pre-Procedure Instructions (Signed)
Raymond Barrera  10/09/2014   Your procedure is scheduled on:  Tuesday November 17 th  Report to [facility] Admitting at  AM.  Call this number if you have problems the morning of surgery: [phone]   Remember:   Do not eat food or drink liquids after midnight.   Take these medicines the morning of surgery with A SIP OF WATER: Oxycodone-acetaminophen Stop Aspirin, Fish oil,Vitamins,Nsaids, Herbal meds 5 days 10/11/14 prior to surgery.  Do not wear jewelry.  Do not wear lotions, powders, or perfumes. You may wear deodorant.   Men may shave face and neck.  Do not bring valuables to the hospital.  [facility] is not responsible for any belongings or valuables.               Contacts, dentures or bridgework may not be worn into surgery.  Leave suitcase in the car. After surgery it may be brought to your room.  For patients admitted to the hospital, discharge time is determined by your  treatment team.               Patients discharged the day of surgery will not be allowed to drive home.    Special Instructions: [place] - Preparing for Surgery  Before surgery, you can play an important role.  Because skin is not sterile, your skin needs to be as free of germs as possible.  You can reduce the number of germs on you skin by washing with CHG (chlorahexidine gluconate) soap before surgery.  CHG is an antiseptic cleaner which kills germs and bonds with the skin to continue killing germs even after washing.  Please DO NOT use if you have an allergy to CHG or antibacterial soaps.  If your skin becomes reddened/irritated stop using the CHG and inform your nurse when you arrive at Short Stay.  Do not shave (including legs and underarms) for at least 48 hours prior to the first CHG shower.  You may shave your face.  Please follow these instructions carefully:   1.  Shower with CHG Soap the night before surgery and the                                morning of Surgery.  2.  If  you choose to wash your hair, wash your hair first as usual with your       normal shampoo.  3.  After you shampoo, rinse your hair and body thoroughly to remove the                      Shampoo.  4.  Use CHG as you would any other liquid soap.  You can apply chg directly       to the skin and wash gently with scrungie or a clean washcloth.  5.  Apply the CHG Soap to your body ONLY FROM THE NECK DOWN.        Do not use on open wounds or open sores.  Avoid contact with your eyes,       ears, mouth and genitals (private parts).  Wash genitals (private parts)       with your normal soap.  6.  Wash thoroughly, paying special attention to the area where your surgery        will be performed.  7.  Thoroughly rinse your body with warm water from the  neck down.  8.  DO NOT shower/wash with your normal soap after using and rinsing off       the CHG Soap.  9.  Pat yourself dry with a clean towel.            10.  Wear clean pajamas.            11.  Place clean sheets on your bed the night of your first shower and do not        sleep with pets.  Day of Surgery  Do not apply any lotions/deoderants the morning of surgery.  Please wear clean clothes to the hospital/surgery center.      Please read over the following fact sheets that you were given: Pain Booklet, Coughing and Deep Breathing, MRSA Information and Surgical Site Infection Prevention

## 2014-10-09 NOTE — Progress Notes (Signed)
Patient has an appointment at Dr Ayesha Rumpf Diego's office tomorrow, asked wife if would have MD if he would fax OV to [place] here at Short Stay PAT.

## 2014-10-12 ENCOUNTER — Ambulatory Visit (HOSPITAL_COMMUNITY): Payer: Self-pay | Admitting: Physical Therapy

## 2014-10-16 MED ORDER — DEXTROSE 5 % IV SOLN
3.0000 g | INTRAVENOUS | Status: AC
  Administered 2014-10-17: 3 g via INTRAVENOUS
  Filled 2014-10-16 (×2): qty 3000

## 2014-10-16 NOTE — Progress Notes (Signed)
Left message on VM and requested that the OV from 10/10/14 be faxed over.

## 2014-10-17 ENCOUNTER — Ambulatory Visit (HOSPITAL_COMMUNITY): Payer: Self-pay | Admitting: Physical Therapy

## 2014-10-17 ENCOUNTER — Observation Stay (HOSPITAL_COMMUNITY)
Admission: RE | Admit: 2014-10-17 | Discharge: 2014-10-18 | Disposition: A | Discharge status: Home / Self Care | Payer: Medicaid Other | Source: Ambulatory Visit | Attending: Neurosurgery | Admitting: Neurosurgery

## 2014-10-17 ENCOUNTER — Ambulatory Visit (HOSPITAL_COMMUNITY): Payer: Medicaid Other

## 2014-10-17 ENCOUNTER — Encounter (HOSPITAL_COMMUNITY)
Admission: RE | Disposition: A | Discharge status: Home / Self Care | Payer: Self-pay | Source: Ambulatory Visit | Attending: Neurosurgery

## 2014-10-17 ENCOUNTER — Ambulatory Visit (HOSPITAL_COMMUNITY): Payer: Medicaid Other | Admitting: Certified Registered Nurse Anesthetist

## 2014-10-17 ENCOUNTER — Encounter (HOSPITAL_COMMUNITY): Payer: Self-pay | Admitting: *Deleted

## 2014-10-17 DIAGNOSIS — Z7982 Long term (current) use of aspirin: Secondary | ICD-10-CM | POA: Insufficient documentation

## 2014-10-17 DIAGNOSIS — M4726 Other spondylosis with radiculopathy, lumbar region: Secondary | ICD-10-CM | POA: Insufficient documentation

## 2014-10-17 DIAGNOSIS — I252 Old myocardial infarction: Secondary | ICD-10-CM | POA: Insufficient documentation

## 2014-10-17 DIAGNOSIS — F17210 Nicotine dependence, cigarettes, uncomplicated: Secondary | ICD-10-CM | POA: Insufficient documentation

## 2014-10-17 DIAGNOSIS — Z8673 Personal history of transient ischemic attack (TIA), and cerebral infarction without residual deficits: Secondary | ICD-10-CM | POA: Insufficient documentation

## 2014-10-17 DIAGNOSIS — E119 Type 2 diabetes mellitus without complications: Secondary | ICD-10-CM | POA: Insufficient documentation

## 2014-10-17 DIAGNOSIS — M4806 Spinal stenosis, lumbar region: Secondary | ICD-10-CM | POA: Diagnosis not present

## 2014-10-17 DIAGNOSIS — M1990 Unspecified osteoarthritis, unspecified site: Secondary | ICD-10-CM | POA: Insufficient documentation

## 2014-10-17 DIAGNOSIS — N189 Chronic kidney disease, unspecified: Secondary | ICD-10-CM | POA: Insufficient documentation

## 2014-10-17 DIAGNOSIS — M47816 Spondylosis without myelopathy or radiculopathy, lumbar region: Secondary | ICD-10-CM | POA: Diagnosis present

## 2014-10-17 DIAGNOSIS — I129 Hypertensive chronic kidney disease with stage 1 through stage 4 chronic kidney disease, or unspecified chronic kidney disease: Secondary | ICD-10-CM | POA: Diagnosis not present

## 2014-10-17 DIAGNOSIS — Z79899 Other long term (current) drug therapy: Secondary | ICD-10-CM | POA: Insufficient documentation

## 2014-10-17 DIAGNOSIS — R269 Unspecified abnormalities of gait and mobility: Secondary | ICD-10-CM

## 2014-10-17 HISTORY — PX: LUMBAR LAMINECTOMY/DECOMPRESSION MICRODISCECTOMY: SHX5026

## 2014-10-17 LAB — CBC
HCT: 37.9 % — ABNORMAL LOW (ref 39.0–52.0)
HEMOGLOBIN: 12.7 g/dL — AB (ref 13.0–17.0)
MCH: 29.3 pg (ref 26.0–34.0)
MCHC: 33.5 g/dL (ref 30.0–36.0)
MCV: 87.3 fL (ref 78.0–100.0)
Platelets: 197 10*3/uL (ref 150–400)
RBC: 4.34 MIL/uL (ref 4.22–5.81)
RDW: 13.7 % (ref 11.5–15.5)
WBC: 11.5 10*3/uL — ABNORMAL HIGH (ref 4.0–10.5)

## 2014-10-17 LAB — GLUCOSE, CAPILLARY
GLUCOSE-CAPILLARY: 111 mg/dL — AB (ref 70–99)
GLUCOSE-CAPILLARY: 121 mg/dL — AB (ref 70–99)
Glucose-Capillary: 101 mg/dL — ABNORMAL HIGH (ref 70–99)
Glucose-Capillary: 165 mg/dL — ABNORMAL HIGH (ref 70–99)

## 2014-10-17 LAB — CREATININE, SERUM
CREATININE: 1.23 mg/dL (ref 0.50–1.35)
GFR calc Af Amer: 75 mL/min — ABNORMAL LOW (ref >90)
GFR, EST NON AFRICAN AMERICAN: 64 mL/min — AB (ref >90)

## 2014-10-17 SURGERY — LUMBAR LAMINECTOMY/DECOMPRESSION MICRODISCECTOMY 2 LEVELS
Anesthesia: General | Site: Back | Laterality: Right

## 2014-10-17 MED ORDER — MIDAZOLAM HCL 2 MG/2ML IJ SOLN
INTRAMUSCULAR | Status: AC
  Filled 2014-10-17: qty 2

## 2014-10-17 MED ORDER — HEPARIN SODIUM (PORCINE) 5000 UNIT/ML IJ SOLN
5000.0000 [IU] | Freq: Three times a day (TID) | INTRAMUSCULAR | Status: DC
  Administered 2014-10-18: 5000 [IU] via SUBCUTANEOUS
  Filled 2014-10-17 (×4): qty 1

## 2014-10-17 MED ORDER — SUCCINYLCHOLINE CHLORIDE 20 MG/ML IJ SOLN
INTRAMUSCULAR | Status: AC
  Filled 2014-10-17: qty 1

## 2014-10-17 MED ORDER — OXYCODONE-ACETAMINOPHEN 5-325 MG PO TABS
1.0000 | ORAL_TABLET | ORAL | Status: DC | PRN
  Administered 2014-10-17: 2 via ORAL
  Administered 2014-10-18: 1 via ORAL
  Filled 2014-10-17 (×2): qty 2
  Filled 2014-10-17: qty 1

## 2014-10-17 MED ORDER — ARTIFICIAL TEARS OP OINT
TOPICAL_OINTMENT | OPHTHALMIC | Status: AC
  Filled 2014-10-17: qty 7

## 2014-10-17 MED ORDER — STERILE WATER FOR INJECTION IJ SOLN
INTRAMUSCULAR | Status: AC
  Filled 2014-10-17: qty 10

## 2014-10-17 MED ORDER — SODIUM CHLORIDE 0.9 % IV SOLN: 250.0000 mL | INTRAVENOUS | Status: DC

## 2014-10-17 MED ORDER — FOLIC ACID 0.5 MG HALF TAB
0.5000 mg | ORAL_TABLET | Freq: Every day | ORAL | Status: DC
  Administered 2014-10-18: 0.5 mg via ORAL
  Filled 2014-10-17: qty 1

## 2014-10-17 MED ORDER — PROPOFOL 10 MG/ML IV BOLUS
INTRAVENOUS | Status: AC
  Filled 2014-10-17: qty 20

## 2014-10-17 MED ORDER — FENTANYL CITRATE 0.05 MG/ML IJ SOLN
INTRAMUSCULAR | Status: AC
  Filled 2014-10-17: qty 5

## 2014-10-17 MED ORDER — OXYCODONE HCL 5 MG PO TABS: 5.0000 mg | ORAL_TABLET | Freq: Once | ORAL | Status: DC | PRN

## 2014-10-17 MED ORDER — PROPOFOL 10 MG/ML IV BOLUS
INTRAVENOUS | Status: DC | PRN
  Administered 2014-10-17: 100 mg via INTRAVENOUS
  Administered 2014-10-17: 200 mg via INTRAVENOUS

## 2014-10-17 MED ORDER — 0.9 % SODIUM CHLORIDE (POUR BTL) OPTIME
TOPICAL | Status: DC | PRN
  Administered 2014-10-17: 1000 mL

## 2014-10-17 MED ORDER — OXYCODONE HCL 5 MG/5ML PO SOLN: 5.0000 mg | Freq: Once | ORAL | Status: DC | PRN

## 2014-10-17 MED ORDER — ARTIFICIAL TEARS OP OINT
TOPICAL_OINTMENT | OPHTHALMIC | Status: DC | PRN
  Administered 2014-10-17: 1 via OPHTHALMIC

## 2014-10-17 MED ORDER — THROMBIN 5000 UNITS EX SOLR
CUTANEOUS | Status: DC | PRN
  Administered 2014-10-17 (×2): 5000 [IU] via TOPICAL

## 2014-10-17 MED ORDER — NEOSTIGMINE METHYLSULFATE 10 MG/10ML IV SOLN
INTRAVENOUS | Status: DC | PRN
  Administered 2014-10-17: 4 mg via INTRAVENOUS

## 2014-10-17 MED ORDER — MIDAZOLAM HCL 5 MG/5ML IJ SOLN
INTRAMUSCULAR | Status: DC | PRN
  Administered 2014-10-17: 2 mg via INTRAVENOUS

## 2014-10-17 MED ORDER — ROCURONIUM BROMIDE 100 MG/10ML IV SOLN
INTRAVENOUS | Status: DC | PRN
  Administered 2014-10-17: 50 mg via INTRAVENOUS

## 2014-10-17 MED ORDER — PHENYLEPHRINE HCL 10 MG/ML IJ SOLN
10.0000 mg | INTRAVENOUS | Status: DC | PRN
  Administered 2014-10-17: 20 ug/min via INTRAVENOUS

## 2014-10-17 MED ORDER — LACTATED RINGERS IV SOLN
INTRAVENOUS | Status: DC | PRN
  Administered 2014-10-17 (×2): via INTRAVENOUS

## 2014-10-17 MED ORDER — PHENYLEPHRINE HCL 10 MG/ML IJ SOLN
INTRAMUSCULAR | Status: DC | PRN
  Administered 2014-10-17: 80 ug via INTRAVENOUS
  Administered 2014-10-17 (×4): 40 ug via INTRAVENOUS

## 2014-10-17 MED ORDER — LIDOCAINE HCL (CARDIAC) 20 MG/ML IV SOLN
INTRAVENOUS | Status: DC | PRN
  Administered 2014-10-17: 80 mg via INTRAVENOUS

## 2014-10-17 MED ORDER — PNEUMOCOCCAL VAC POLYVALENT 25 MCG/0.5ML IJ INJ
0.5000 mL | INJECTION | INTRAMUSCULAR | Status: AC
  Administered 2014-10-18: 0.5 mL via INTRAMUSCULAR
  Filled 2014-10-17: qty 0.5

## 2014-10-17 MED ORDER — DIAZEPAM 5 MG PO TABS
5.0000 mg | ORAL_TABLET | Freq: Four times a day (QID) | ORAL | Status: DC | PRN
  Administered 2014-10-17: 5 mg via ORAL
  Filled 2014-10-17 (×2): qty 1

## 2014-10-17 MED ORDER — ARTIFICIAL TEARS OP OINT
TOPICAL_OINTMENT | OPHTHALMIC | Status: AC
  Filled 2014-10-17: qty 3.5

## 2014-10-17 MED ORDER — ONDANSETRON HCL 4 MG/2ML IJ SOLN
INTRAMUSCULAR | Status: AC
  Filled 2014-10-17: qty 2

## 2014-10-17 MED ORDER — ONDANSETRON HCL 4 MG/2ML IJ SOLN
4.0000 mg | INTRAMUSCULAR | Status: DC | PRN
  Administered 2014-10-17: 4 mg via INTRAVENOUS
  Filled 2014-10-17: qty 2

## 2014-10-17 MED ORDER — MENTHOL 3 MG MT LOZG: 1.0000 | LOZENGE | OROMUCOSAL | Status: DC | PRN

## 2014-10-17 MED ORDER — SODIUM CHLORIDE 0.9 % IJ SOLN: 3.0000 mL | INTRAMUSCULAR | Status: DC | PRN

## 2014-10-17 MED ORDER — SODIUM CHLORIDE 0.9 % IR SOLN
Status: DC | PRN
  Administered 2014-10-17: 500 mL

## 2014-10-17 MED ORDER — LACTATED RINGERS IV SOLN
Freq: Once | INTRAVENOUS | Status: AC
  Administered 2014-10-17: 10:00:00 via INTRAVENOUS

## 2014-10-17 MED ORDER — GLYCOPYRROLATE 0.2 MG/ML IJ SOLN
INTRAMUSCULAR | Status: DC | PRN
  Administered 2014-10-17: 0.6 mg via INTRAVENOUS

## 2014-10-17 MED ORDER — INFLUENZA VAC SPLIT QUAD 0.5 ML IM SUSY
0.5000 mL | PREFILLED_SYRINGE | INTRAMUSCULAR | Status: AC
  Administered 2014-10-18: 0.5 mL via INTRAMUSCULAR
  Filled 2014-10-17: qty 0.5

## 2014-10-17 MED ORDER — HEMOSTATIC AGENTS (NO CHARGE) OPTIME
TOPICAL | Status: DC | PRN
  Administered 2014-10-17: 1 via TOPICAL

## 2014-10-17 MED ORDER — SIMVASTATIN 40 MG PO TABS
40.0000 mg | ORAL_TABLET | Freq: Every day | ORAL | Status: DC
  Administered 2014-10-17 – 2014-10-18 (×2): 40 mg via ORAL
  Filled 2014-10-17 (×2): qty 1

## 2014-10-17 MED ORDER — VITAMIN C 500 MG PO TABS
1000.0000 mg | ORAL_TABLET | Freq: Every day | ORAL | Status: DC
  Administered 2014-10-18: 1000 mg via ORAL
  Filled 2014-10-17: qty 2

## 2014-10-17 MED ORDER — ACETAMINOPHEN 10 MG/ML IV SOLN
INTRAVENOUS | Status: DC | PRN
  Administered 2014-10-17: 1000 mg via INTRAVENOUS

## 2014-10-17 MED ORDER — EPHEDRINE SULFATE 50 MG/ML IJ SOLN
INTRAMUSCULAR | Status: DC | PRN
  Administered 2014-10-17: 10 mg via INTRAVENOUS
  Administered 2014-10-17 (×2): 5 mg via INTRAVENOUS
  Administered 2014-10-17 (×3): 10 mg via INTRAVENOUS

## 2014-10-17 MED ORDER — STERILE WATER FOR INJECTION IJ SOLN
INTRAMUSCULAR | Status: AC
  Filled 2014-10-17: qty 30

## 2014-10-17 MED ORDER — CEFAZOLIN SODIUM 1-5 GM-% IV SOLN
1.0000 g | Freq: Three times a day (TID) | INTRAVENOUS | Status: AC
  Administered 2014-10-17 – 2014-10-18 (×2): 1 g via INTRAVENOUS
  Filled 2014-10-17 (×2): qty 50

## 2014-10-17 MED ORDER — BENAZEPRIL HCL 20 MG PO TABS
20.0000 mg | ORAL_TABLET | Freq: Every day | ORAL | Status: DC
  Administered 2014-10-17 – 2014-10-18 (×2): 20 mg via ORAL
  Filled 2014-10-17 (×2): qty 1

## 2014-10-17 MED ORDER — DIAZEPAM 5 MG PO TABS: 5.0000 mg | ORAL_TABLET | Freq: Four times a day (QID) | ORAL | Status: DC | PRN

## 2014-10-17 MED ORDER — FENTANYL CITRATE 0.05 MG/ML IJ SOLN
INTRAMUSCULAR | Status: DC | PRN
Start: 2014-10-17 — End: 2014-10-17
  Administered 2014-10-17: 50 ug via INTRAVENOUS
  Administered 2014-10-17: 100 ug via INTRAVENOUS
  Administered 2014-10-17 (×3): 50 ug via INTRAVENOUS

## 2014-10-17 MED ORDER — ROCURONIUM BROMIDE 50 MG/5ML IV SOLN
INTRAVENOUS | Status: AC
  Filled 2014-10-17: qty 1

## 2014-10-17 MED ORDER — FOLIC ACID 400 MCG PO TABS: 400.0000 ug | ORAL_TABLET | Freq: Every day | ORAL | Status: DC

## 2014-10-17 MED ORDER — LIDOCAINE HCL (CARDIAC) 20 MG/ML IV SOLN
INTRAVENOUS | Status: AC
  Filled 2014-10-17: qty 5

## 2014-10-17 MED ORDER — SODIUM CHLORIDE 0.9 % IV SOLN: INTRAVENOUS | Status: DC

## 2014-10-17 MED ORDER — BUPIVACAINE HCL (PF) 0.5 % IJ SOLN
INTRAMUSCULAR | Status: DC | PRN
  Administered 2014-10-17: 4.5 mL

## 2014-10-17 MED ORDER — GLYCOPYRROLATE 0.2 MG/ML IJ SOLN
INTRAMUSCULAR | Status: AC
  Filled 2014-10-17: qty 4

## 2014-10-17 MED ORDER — PHENYLEPHRINE HCL 10 MG/ML IJ SOLN
INTRAMUSCULAR | Status: AC
  Filled 2014-10-17: qty 1

## 2014-10-17 MED ORDER — HYDROMORPHONE HCL 1 MG/ML IJ SOLN
0.2500 mg | INTRAMUSCULAR | Status: DC | PRN
  Administered 2014-10-17 (×2): 0.5 mg via INTRAVENOUS

## 2014-10-17 MED ORDER — ONDANSETRON HCL 4 MG/2ML IJ SOLN
INTRAMUSCULAR | Status: AC
  Administered 2014-10-17: 4 mg via INTRAVENOUS
  Filled 2014-10-17: qty 2

## 2014-10-17 MED ORDER — ONDANSETRON HCL 4 MG/2ML IJ SOLN
4.0000 mg | Freq: Four times a day (QID) | INTRAMUSCULAR | Status: AC | PRN
  Administered 2014-10-17: 4 mg via INTRAVENOUS

## 2014-10-17 MED ORDER — VITAMIN C 500 MG PO CHEW: 1000.0000 mg | CHEWABLE_TABLET | Freq: Every day | ORAL | Status: DC

## 2014-10-17 MED ORDER — EPHEDRINE SULFATE 50 MG/ML IJ SOLN
INTRAMUSCULAR | Status: AC
  Filled 2014-10-17: qty 2

## 2014-10-17 MED ORDER — EPHEDRINE SULFATE 50 MG/ML IJ SOLN
INTRAMUSCULAR | Status: AC
  Filled 2014-10-17: qty 1

## 2014-10-17 MED ORDER — SODIUM CHLORIDE 0.9 % IJ SOLN: 3.0000 mL | Freq: Two times a day (BID) | INTRAMUSCULAR | Status: DC

## 2014-10-17 MED ORDER — OXYCODONE-ACETAMINOPHEN 10-325 MG PO TABS
1.0000 | ORAL_TABLET | Freq: Four times a day (QID) | ORAL | Status: DC | PRN
Start: 2014-10-17 — End: 2015-03-21

## 2014-10-17 MED ORDER — PHENOL 1.4 % MT LIQD: 1.0000 | OROMUCOSAL | Status: DC | PRN

## 2014-10-17 MED ORDER — SENNA 8.6 MG PO TABS
1.0000 | ORAL_TABLET | Freq: Two times a day (BID) | ORAL | Status: DC
  Administered 2014-10-18: 8.6 mg via ORAL
  Filled 2014-10-17 (×2): qty 1

## 2014-10-17 MED ORDER — ACETAMINOPHEN 10 MG/ML IV SOLN
INTRAVENOUS | Status: AC
  Filled 2014-10-17: qty 100

## 2014-10-17 MED ORDER — HYDROMORPHONE HCL 1 MG/ML IJ SOLN
INTRAMUSCULAR | Status: AC
  Administered 2014-10-17: 0.5 mg via INTRAVENOUS
  Filled 2014-10-17: qty 1

## 2014-10-17 MED ORDER — DOCUSATE SODIUM 100 MG PO CAPS
100.0000 mg | ORAL_CAPSULE | Freq: Two times a day (BID) | ORAL | Status: DC
  Administered 2014-10-18: 100 mg via ORAL
  Filled 2014-10-17 (×3): qty 1

## 2014-10-17 MED ORDER — MORPHINE SULFATE 2 MG/ML IJ SOLN
1.0000 mg | INTRAMUSCULAR | Status: DC | PRN
  Administered 2014-10-17: 4 mg via INTRAVENOUS
  Administered 2014-10-17: 2 mg via INTRAVENOUS
  Filled 2014-10-17: qty 2
  Filled 2014-10-17: qty 1

## 2014-10-17 MED ORDER — METFORMIN HCL 500 MG PO TABS
500.0000 mg | ORAL_TABLET | Freq: Two times a day (BID) | ORAL | Status: DC
  Administered 2014-10-17 – 2014-10-18 (×2): 500 mg via ORAL
  Filled 2014-10-17 (×4): qty 1

## 2014-10-17 MED ORDER — LIDOCAINE-EPINEPHRINE 1 %-1:100000 IJ SOLN
INTRAMUSCULAR | Status: DC | PRN
  Administered 2014-10-17: 4.5 mL

## 2014-10-17 MED ORDER — NEOSTIGMINE METHYLSULFATE 10 MG/10ML IV SOLN
INTRAVENOUS | Status: AC
  Filled 2014-10-17: qty 2

## 2014-10-17 MED ORDER — THROMBIN 5000 UNITS EX SOLR
OROMUCOSAL | Status: DC | PRN
  Administered 2014-10-17: 5 mL via TOPICAL

## 2014-10-17 MED ORDER — ONDANSETRON HCL 4 MG/2ML IJ SOLN
INTRAMUSCULAR | Status: DC | PRN
  Administered 2014-10-17: 4 mg via INTRAVENOUS

## 2014-10-17 SURGICAL SUPPLY — 65 items
ADH SKN CLS APL DERMABOND .7 (GAUZE/BANDAGES/DRESSINGS) ×1
APL SKNCLS STERI-STRIP NONHPOA (GAUZE/BANDAGES/DRESSINGS)
BAG DECANTER FOR FLEXI CONT (MISCELLANEOUS) ×2 IMPLANT
BENZOIN TINCTURE PRP APPL 2/3 (GAUZE/BANDAGES/DRESSINGS) IMPLANT
BLADE CLIPPER SURG (BLADE) IMPLANT
BLADE SURG 11 STRL SS (BLADE) ×2 IMPLANT
BUR MATCHSTICK NEURO 3.0 LAGG (BURR) ×3 IMPLANT
CANISTER SUCT 3000ML (MISCELLANEOUS) ×2 IMPLANT
CONT SPEC 4OZ CLIKSEAL STRL BL (MISCELLANEOUS) ×2 IMPLANT
DECANTER SPIKE VIAL GLASS SM (MISCELLANEOUS) ×2 IMPLANT
DERMABOND ADVANCED (GAUZE/BANDAGES/DRESSINGS) ×1
DERMABOND ADVANCED .7 DNX12 (GAUZE/BANDAGES/DRESSINGS) IMPLANT
DRAPE LAPAROTOMY 100X72X124 (DRAPES) ×2 IMPLANT
DRAPE MICROSCOPE LEICA (MISCELLANEOUS) ×2 IMPLANT
DRAPE POUCH INSTRU U-SHP 10X18 (DRAPES) ×2 IMPLANT
DRAPE SURG 17X23 STRL (DRAPES) ×2 IMPLANT
DRSG OPSITE POSTOP 4X6 (GAUZE/BANDAGES/DRESSINGS) ×2 IMPLANT
DURAPREP 26ML APPLICATOR (WOUND CARE) ×2 IMPLANT
ELECT REM PT RETURN 9FT ADLT (ELECTROSURGICAL) ×2
ELECTRODE REM PT RTRN 9FT ADLT (ELECTROSURGICAL) ×1 IMPLANT
GAUZE SPONGE 4X4 12PLY STRL (GAUZE/BANDAGES/DRESSINGS) IMPLANT
GAUZE SPONGE 4X4 16PLY XRAY LF (GAUZE/BANDAGES/DRESSINGS) IMPLANT
GLOVE BIOGEL PI IND STRL 7.0 (GLOVE) IMPLANT
GLOVE BIOGEL PI IND STRL 7.5 (GLOVE) ×1 IMPLANT
GLOVE BIOGEL PI INDICATOR 7.0 (GLOVE) ×2
GLOVE BIOGEL PI INDICATOR 7.5 (GLOVE) ×1
GLOVE ECLIPSE 7.0 STRL STRAW (GLOVE) ×2 IMPLANT
GLOVE ECLIPSE 8.0 STRL XLNG CF (GLOVE) ×1 IMPLANT
GLOVE EXAM NITRILE LRG STRL (GLOVE) IMPLANT
GLOVE EXAM NITRILE MD LF STRL (GLOVE) IMPLANT
GLOVE EXAM NITRILE XL STR (GLOVE) IMPLANT
GLOVE EXAM NITRILE XS STR PU (GLOVE) IMPLANT
GLOVE OPTIFIT SS 6.5 STRL BRWN (GLOVE) ×4 IMPLANT
GOWN STRL REUS W/ TWL LRG LVL3 (GOWN DISPOSABLE) ×2 IMPLANT
GOWN STRL REUS W/ TWL XL LVL3 (GOWN DISPOSABLE) IMPLANT
GOWN STRL REUS W/TWL 2XL LVL3 (GOWN DISPOSABLE) IMPLANT
GOWN STRL REUS W/TWL LRG LVL3 (GOWN DISPOSABLE) ×6
GOWN STRL REUS W/TWL XL LVL3 (GOWN DISPOSABLE)
HEMOSTAT POWDER KIT SURGIFOAM (HEMOSTASIS) ×2 IMPLANT
HEMOSTAT POWDER SURGIFOAM 1G (HEMOSTASIS) ×1 IMPLANT
KIT BASIN OR (CUSTOM PROCEDURE TRAY) ×2 IMPLANT
KIT ROOM TURNOVER OR (KITS) ×2 IMPLANT
LIQUID BAND (GAUZE/BANDAGES/DRESSINGS) ×2 IMPLANT
NDL HYPO 18GX1.5 BLUNT FILL (NEEDLE) IMPLANT
NDL HYPO 25X1 1.5 SAFETY (NEEDLE) ×1 IMPLANT
NDL SPNL 18GX3.5 QUINCKE PK (NEEDLE) IMPLANT
NEEDLE HYPO 18GX1.5 BLUNT FILL (NEEDLE) IMPLANT
NEEDLE HYPO 25X1 1.5 SAFETY (NEEDLE) ×2 IMPLANT
NEEDLE SPNL 18GX3.5 QUINCKE PK (NEEDLE) IMPLANT
NS IRRIG 1000ML POUR BTL (IV SOLUTION) ×2 IMPLANT
PACK LAMINECTOMY NEURO (CUSTOM PROCEDURE TRAY) ×2 IMPLANT
PAD ARMBOARD 7.5X6 YLW CONV (MISCELLANEOUS) ×6 IMPLANT
RUBBERBAND STERILE (MISCELLANEOUS) ×4 IMPLANT
SPONGE LAP 4X18 X RAY DECT (DISPOSABLE) IMPLANT
SPONGE SURGIFOAM ABS GEL SZ50 (HEMOSTASIS) ×2 IMPLANT
STRIP CLOSURE SKIN 1/2X4 (GAUZE/BANDAGES/DRESSINGS) IMPLANT
SUT VIC AB 0 CT1 18XCR BRD8 (SUTURE) ×1 IMPLANT
SUT VIC AB 0 CT1 8-18 (SUTURE) ×2
SUT VIC AB 2-0 CT1 18 (SUTURE) IMPLANT
SUT VICRYL 3-0 RB1 18 ABS (SUTURE) ×3 IMPLANT
SYR 20ML ECCENTRIC (SYRINGE) ×2 IMPLANT
SYR 3ML LL SCALE MARK (SYRINGE) IMPLANT
TOWEL OR 17X24 6PK STRL BLUE (TOWEL DISPOSABLE) ×2 IMPLANT
TOWEL OR 17X26 10 PK STRL BLUE (TOWEL DISPOSABLE) ×2 IMPLANT
WATER STERILE IRR 1000ML POUR (IV SOLUTION) ×2 IMPLANT

## 2014-10-17 NOTE — H&P (Signed)
CC:  No chief complaint on file.   HPI: Raymond Barrera is a 53 year old man seen in the outpatient clinic. He was initially seen several months ago with primarily right-sided buttock and leg pain consistent with a lumbar polyradiculopathy. He was referred for conservative treatments, including lumbar epidural steroid injections. He has undergone to such injections, and states that he only got relief for a couple of days after each injection. He says the pain continues to be as severe as it was initially, he has a difficult time walking.   The pain started approximately 6 months ago without any inciting event. He describes it as a sharp pain across the lower part of his back which radiates into the right groin and the anterior part of his right thigh. He says occasionally he does get pain which radiates down into the right calf and around his ankle on the right side. He does occasionally get some pain which radiates into the left groin, however the right leg symptoms are much worse than the left. In addition to this, he says he gets a burning sensation sometimes around his right knee. He has been using a cane to walk because he feels off balance. He does not have any changes in bladder function. He has been prescribed Percocet tens which do provide some relief. Heat packs of also provided some relief from his back. The pain is fairly constant, and his is not significantly changed when he is up walking.   PMH: Past Medical History  Diagnosis Date  . Hypertension   . High cholesterol   . Myocardial infarction 1987    "slight one"  . Complication of anesthesia     woke up during surgery 2008 ankle surgery  . Stroke 08/2013  . Pneumonia     years ago  . Diabetes mellitus without complication     on meds  . Chronic kidney disease     watching  . Arthritis   . Headache     migraines    PSH: Past Surgical History  Procedure Laterality Date  . Ankle surgery  2008    left ankle-otif-[facility]  . Mass  excision  09/13/2012    Procedure: EXCISION MASS;  Surgeon: Jamesetta So, MD;  Location: [facility];  Service: General;  Laterality: N/A;  . Tonsillectomy      SH: History  Substance Use Topics  . Smoking status: Current Every Day Smoker -- 0.25 packs/day for 30 years    Types: Cigarettes  . Smokeless tobacco: Never Used  . Alcohol Use: No     Comment: in the past    MEDS: Prior to Admission medications   Medication Sig Start Date End Date Taking? Authorizing Provider  acetaminophen (TYLENOL) 500 MG tablet Take 1,000 mg by mouth every 4 (four) hours as needed for moderate pain.   Yes Historical Provider, MD  Ascorbic Acid (VITAMIN C) 500 MG CHEW Chew 1,000 mg by mouth daily.   Yes Historical Provider, MD  aspirin EC 81 MG tablet Take 162 mg by mouth daily.   Yes Historical Provider, MD  benazepril (LOTENSIN) 20 MG tablet Take 20 mg by mouth daily.   Yes Historical Provider, MD  Chromium Picolinate 1000 MCG TABS Take 1 tablet by mouth every morning.   Yes Historical Provider, MD  folic acid (FOLVITE) 425 MCG tablet Take 400 mcg by mouth daily.   Yes Historical Provider, MD  GLUCOSAMINE-CHONDROITIN PO Take 1 tablet by mouth daily.   Yes Historical Provider, MD  metFORMIN (GLUCOPHAGE) 500 MG tablet Take 500 mg by mouth 2 (two) times daily with a meal.   Yes Historical Provider, MD  Omega-3 Fatty Acids (FISH OIL) 1000 MG CAPS Take 1 capsule by mouth every morning.   Yes Historical Provider, MD  oxyCODONE-acetaminophen (PERCOCET) 7.5-325 MG per tablet Take 2 tablets by mouth every 4 (four) hours as needed for pain.   Yes Historical Provider, MD  simvastatin (ZOCOR) 40 MG tablet Take 40 mg by mouth daily.   Yes Historical Provider, MD  benazepril (LOTENSIN) 10 MG tablet Take 10 mg by mouth daily.    Historical Provider, MD  glipiZIDE (GLUCOTROL) 10 MG tablet Take 10 mg by mouth daily.    Historical Provider, MD  hydrochlorothiazide (HYDRODIURIL) 25 MG tablet Take 1 tablet (25 mg total) by  mouth daily. Patient not taking: Reported on 10/04/2014 08/31/13   Antony Contras, MD  oxyCODONE-acetaminophen (PERCOCET) 10-325 MG per tablet Take 1 tablet by mouth every 6 (six) hours as needed for pain. Patient not taking: Reported on 10/04/2014 05/26/14   Evalee Jefferson, PA-C  simvastatin (ZOCOR) 20 MG tablet Take 1 tablet (20 mg total) by mouth daily at 6 PM. Patient not taking: Reported on 10/04/2014 03/09/13   Donzetta Starch, NP    ALLERGY: Allergies  Allergen Reactions  . Poison Ivy Extract [Extract Of Poison Ivy] Itching and Rash    ROS: Review of Systems  Constitutional: Negative for fever.  Eyes: Negative for blurred vision and double vision.  Respiratory: Negative for cough.   Cardiovascular: Negative for chest pain.  Gastrointestinal: Negative for heartburn, nausea and vomiting.  Genitourinary: Negative for dysuria.  Musculoskeletal: Positive for back pain. Negative for myalgias.  Skin: Negative for rash.  Neurological: Positive for sensory change. Negative for dizziness and headaches.  Endo/Heme/Allergies: Does not bruise/bleed easily.  Psychiatric/Behavioral: Negative for depression.    NEUROLOGIC EXAM: Awake, alert, oriented Memory and concentration grossly intact Speech fluent, appropriate CN grossly intact Motor exam: Upper Extremities Deltoid Bicep Tricep Grip  Right 5/5 5/5 5/5 5/5  Left 5/5 5/5 5/5 5/5   Lower Extremity IP Quad PF DF EHL  Right 5/5 5/5 5/5 5/5 5/5  Left 5/5 5/5 5/5 5/5 5/5   Sensation grossly intact to LT  [facility]: MRI of the lumbar spine was again reviewed demonstrating primary pathology at L4 L5 and L5-S1. At both levels there is loss of disc height, and at L4 L5 there is facet and ligamentous hypertrophy causing moderate foraminal stenosis on the right. At L5-S1 there is also broad-based disc bulge with bilateral facet and ligamentous hypertrophy causing foraminal stenosis.   IMPRESSION: 53 year old man with right-sided radiculopathy who was  failed conservative treatments  PLAN: Proceed with surgical decompression via right-sided L4 L5 and L5-S1 laminotomy and foraminotomy   At this point, I told the patient the only remaining option for him was surgical decompression. We discussed the details of the procedure, as well as the risks and potential benefits. The risks of the surgery were discussed in detail, including but not limited to bleeding, infection, nerve injury resulting in leg/foot weakness or bowel/bladder dysfunction, and CSF leak.   I also did discuss with the patient the possibility of undergoing surgery without any significant improvement in his pain, and in fact the possibility that his pain could get worse after surgery.   The patient understood our discussion and is willing to proceed. All questions were answered.

## 2014-10-17 NOTE — Anesthesia Postprocedure Evaluation (Signed)
Anesthesia Post Note  Patient: Raymond Barrera  Procedure(s) Performed: Procedure(s) (LRB): LUMBAR LAMINECTOMY/DECOMPRESSION MICRODISCECTOMY 2 LEVELS (Right)  Anesthesia type: General  Patient location: PACU  Post pain: Pain level controlled and Adequate analgesia  Post assessment: Post-op Vital signs reviewed, Patient's Cardiovascular Status Stable, Respiratory Function Stable, Patent Airway and Pain level controlled  Last Vitals:  Filed Vitals:   10/17/14 1425  BP:   Pulse: 66  Temp:   Resp: 18    Post vital signs: Reviewed and stable  Level of consciousness: awake, alert  and oriented  Complications: No apparent anesthesia complications

## 2014-10-17 NOTE — Anesthesia Procedure Notes (Addendum)
Procedure Name: Intubation Date/Time: 10/17/2014 11:11 AM Performed by: Garrison Columbus T Pre-anesthesia Checklist: Patient identified, Timeout performed, Emergency Drugs available, Patient being monitored and Suction available Patient Re-evaluated:Patient Re-evaluated prior to inductionOxygen Delivery Method: Circle system utilized Preoxygenation: Pre-oxygenation with 100% oxygen Intubation Type: IV induction Ventilation: Mask ventilation without difficulty and Oral airway inserted - appropriate to patient size Laryngoscope Size: Sabra Heck and 2 Grade View: Grade II Tube type: Oral Tube size: 7.5 mm Number of attempts: 1 Airway Equipment and Method: Stylet Placement Confirmation: ETT inserted through vocal cords under direct vision,  positive ETCO2,  CO2 detector and breath sounds checked- equal and bilateral Secured at: 22 cm Tube secured with: Tape Dental Injury: Teeth and Oropharynx as per pre-operative assessment

## 2014-10-17 NOTE — Discharge Summary (Addendum)
Physician Discharge Summary  Patient ID: Raymond Barrera MRN: [id number] DOB/AGE: 1961/01/03 53 y.o.  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 male who presented to the clinic with right-sided radiculopathy and MRI demonstrating foraminal and lateral recess stenosis at L4-5 and L5-S1. The patient was admitted for elective right L4-5 and L5-S1 laminotomy and foraminotomy which was done without complication. Postoperatively the patient was at his neurologic baseline, back pain was controlled with oral medication, he was ambulating without difficulty, voiding normally, and tolerating diet.  Treatments: Surgery - right L4-5, L5-S1 laminotomy, foraminotomy  Discharge Exam: Blood pressure 127/89, pulse 99, temperature 98.5 F (36.9 C), temperature source Oral, resp. rate 18, weight 119.75 kg (264 lb), SpO2 98 %. Awake, alert, oriented Speech fluent, appropriate CN grossly intact 5/5 BUE/BLE Wound c/d/i  Follow-up: Follow-up in my office [facility] [phone]) in 2-3 weeks  Disposition: 01-Home or Self Care     Medication List    STOP taking these medications        aspirin EC 81 MG tablet      TAKE these medications        acetaminophen 500 MG tablet  Commonly known as:  TYLENOL  Take 1,000 mg by mouth every 4 (four) hours as needed for moderate pain.     benazepril 20 MG tablet  Commonly known as:  LOTENSIN  Take 20 mg by mouth daily.     benazepril 10 MG tablet  Commonly known as:  LOTENSIN  Take 10 mg by mouth daily.     Chromium Picolinate 1000 MCG Tabs  Take 1 tablet by mouth every morning.     diazepam 5 MG tablet  Commonly known as:  VALIUM  Take 1 tablet (5 mg total) by mouth every 6 (six) hours  as needed for anxiety.     Fish Oil 1000 MG Caps  Take 1 capsule by mouth every morning.     folic acid 440 MCG tablet  Commonly known as:  FOLVITE  Take 400 mcg by mouth daily.     glipiZIDE 10 MG tablet  Commonly known as:  GLUCOTROL  Take 10 mg by mouth daily.     GLUCOSAMINE-CHONDROITIN PO  Take 1 tablet by mouth daily.     hydrochlorothiazide 25 MG tablet  Commonly known as:  HYDRODIURIL  Take 1 tablet (25 mg total) by mouth daily.     metFORMIN 500 MG tablet  Commonly known as:  GLUCOPHAGE  Take 500 mg by mouth 2 (two) times daily with a meal.     ondansetron 4 MG tablet  Commonly known as:  ZOFRAN  Take 1 tablet (4 mg total) by mouth every 8 (eight) hours as needed for nausea or vomiting.     oxyCODONE-acetaminophen 10-325 MG per tablet  Commonly known as:  PERCOCET  Take 1-2 tablets by mouth every 6 (six) hours as needed for pain.     simvastatin 40 MG tablet  Commonly known as:  ZOCOR  Take 40 mg by mouth daily.     simvastatin 20 MG tablet  Commonly known as:  ZOCOR  Take 1 tablet (20 mg total) by mouth daily at 6 PM.     Vitamin C 500 MG Chew  Chew 1,000 mg by mouth daily.  SignedConsuella Lose, C 10/18/2014, 9:13 AM    Pt remained overnight due to nausea and back pain. Feeling better today, still some nausea but able to eat some breakfast. Will d/c with Zofran.

## 2014-10-17 NOTE — Op Note (Signed)
PREOP DIAGNOSIS: Lateral recess/foraminal stenosis, right L4-5, right L5-S1  POSTOP DIAGNOSIS: Same  PROCEDURE: 1. Right L4-5 laminotomy, foraminotomy for decompression of right L4 nerve root 2. Right L5-S1 laminotomy, foraminotomy for decompression of right L5 nerve root  SURGEON: Dr. [name], MD  ASSISTANT: Dr. Leeroy Cha, MD  ANESTHESIA: General Endotracheal  EBL: 100cc  SPECIMENS: None  DRAINS: None  COMPLICATIONS: None immediate  CONDITION: Stable to PCAU  HISTORY: Raymond Barrera is a 53 y.o. male who initially presented to the outpatient clinic with symptoms consistent with right-sided poly-radiculopathy. He underwent MRI scan demonstrating multilevel lumbar spondylosis with foraminal stenosis at L4-5 and L5-S1. Treatment options were discussed and he elected to proceed with surgical decompression by laminectomy and foraminotomies at L4-5 and L5-S1 on the right.  PROCEDURE IN DETAIL: After informed consent was obtained and witnessed, the patient was brought to the operating room. After induction of general anesthesia, the patient was positioned on the operative table in the prone position with all pressure points meticulously padded. The skin of the low back was then prepped and draped in the usual sterile fashion.  After timeout was conducted, the skin was infiltrated with local anesthetic. Finder needle was then introduced and the L5 pedicle was identified. Skin incision was then made sharply and Bovie electrocautery was used to dissect the subcutaneous tissue until the lumbodorsal fascia was identified. The fascia was then incised using Bovie electrocautery and the lamina at the L5 and L4 levels was identified and dissection was carried out in the subperiosteal plane. Self-retaining retractor was then placed, and intraoperative x-ray was taken to confirm we were at the correct levels.  Using a high-speed drill, right superior L5 laminotomy was completed. The  ligamentum flavum was then identified at the inferior margin of the laminotomy and removed. Dissection was carried out lateral to the thecal sac to identify the exiting right L5 nerve root. The neuroforamen did appear fairly stenotic. Using a combination of curettes and straight and curved Kerrison rongeurs, right L5 foraminotomy was completed. Decompression was then confirmed using a ball tip dissector, and a blunt hockey-stick type dissector.  In a similar fashion, subperiosteal dissection was then carried out on the right L4 lamina. Self-retaining retractor was then placed at this level. Superior right L4 laminotomy was then completed, and the superior margin of the ligamentum flavum was identified at the inferior portion of the laminotomy. This was removed using Kerrison rongeurs. Again, dissection was carried out lateral to the thecal sac to identify the exiting right L4 nerve root. Foraminal stenosis at this level sleeve and slightly less severe than at the L5 level. Again, using a combination of curettes, straight and curved Kerrison rongeurs, a right L4-5 laminotomy was completed. Good decompression was again confirmed using dissectors.  Hemostasis was then secured using a combination of morcellized Gelfoam and thrombin and bipolar electrocautery. The wound was irrigated with copious amounts of antibiotic saline. Self-retaining retractor was then removed, and the wound is closed in layers using a combination of interrupted 0 Vicryl and 3-0 Vicryl stitches. The skin was closed using standard skin glue.  At the end of the case all sponge, needle, and instrument counts were correct. The patient was then transferred to the stretcher and taken to the postanesthesia care unit in stable hemodynamic condition.

## 2014-10-17 NOTE — Transfer of Care (Signed)
Immediate Anesthesia Transfer of Care Note  Patient: Raymond Barrera  Procedure(s) Performed: Procedure(s) with comments: LUMBAR LAMINECTOMY/DECOMPRESSION MICRODISCECTOMY 2 LEVELS (Right) - Right L45 L5S1 laminectomy and foraminotomy  Patient Location: PACU  Anesthesia Type:General  Level of Consciousness: awake, alert  and oriented  Airway & Oxygen Therapy: Patient Spontanous Breathing and Patient connected to nasal cannula oxygen  Post-op Assessment: Report given to PACU RN, Post -op Vital signs reviewed and stable and Patient moving all extremities X 4  Post vital signs: Reviewed and stable  Complications: No apparent anesthesia complications

## 2014-10-17 NOTE — Anesthesia Preprocedure Evaluation (Addendum)
Anesthesia Evaluation  Patient identified by MRN, date of birth, ID band Patient awake    Reviewed: Allergy & Precautions, H&P , NPO status , Patient's Chart, lab work & pertinent test results  Airway Mallampati: II  TM Distance: >3 FB Neck ROM: full    Dental  (+) Dental Advisory Given, Teeth Intact   Pulmonary Current Smoker,          Cardiovascular hypertension, Pt. on medications + Past MI     Neuro/Psych  Headaches, CVA    GI/Hepatic (+)     substance abuse  cocaine use,   Endo/Other  diabetes, Type 2, Oral Hypoglycemic Agents  Renal/GU      Musculoskeletal  (+) Arthritis -,   Abdominal   Peds  Hematology   Anesthesia Other Findings   Reproductive/Obstetrics                          Anesthesia Physical Anesthesia Plan  ASA: III  Anesthesia Plan: General   Post-op Pain Management:    Induction: Intravenous  Airway Management Planned: Oral ETT  Additional Equipment:   Intra-op Plan:   Post-operative Plan: Extubation in OR  Informed Consent: I have reviewed the patients History and Physical, chart, labs and discussed the procedure including the risks, benefits and alternatives for the proposed anesthesia with the patient or authorized representative who has indicated his/her understanding and acceptance.   Dental advisory given  Plan Discussed with: CRNA, Anesthesiologist and Surgeon  Anesthesia Plan Comments:        Anesthesia Quick Evaluation

## 2014-10-18 ENCOUNTER — Encounter (HOSPITAL_COMMUNITY): Payer: Self-pay | Admitting: Neurosurgery

## 2014-10-18 DIAGNOSIS — M4806 Spinal stenosis, lumbar region: Secondary | ICD-10-CM | POA: Diagnosis not present

## 2014-10-18 LAB — GLUCOSE, CAPILLARY: Glucose-Capillary: 115 mg/dL — ABNORMAL HIGH (ref 70–99)

## 2014-10-18 MED ORDER — ONDANSETRON HCL 4 MG PO TABS: 4.0000 mg | ORAL_TABLET | Freq: Three times a day (TID) | ORAL | Status: DC | PRN

## 2014-10-18 MED ORDER — ONDANSETRON HCL 4 MG PO TABS
4.0000 mg | ORAL_TABLET | Freq: Once | ORAL | Status: AC
  Administered 2014-10-18: 4 mg via ORAL

## 2014-10-18 MED ORDER — ONDANSETRON HCL 4 MG PO TABS
4.0000 mg | ORAL_TABLET | Freq: Once | ORAL | Status: DC
  Filled 2014-10-18: qty 1

## 2014-10-18 NOTE — Progress Notes (Signed)
Pt and wife given D/C instructions with Rx's, verbal understanding was provided. [name] IV was removed prior to D/C. [name] incision was clean and dry with no sign of infection. Pt D/C'd home via wheelchair @ 1215 per MD order. Pt is stable @ D/C and has no other needs at this time. Holli Humbles, RN

## 2014-10-18 NOTE — Discharge Instructions (Signed)
Laminectomy - Laminotomy - Discectomy  Your surgeon has decided that a laminectomy (entire lamina removal) or laminotomy (partial lamina removal) is the best treatment for your back problem. These procedures involve removal of bone to relieve pressure on nerve roots. It allows the surgeon access to parts of the spine where other problems are located. This could be an injured disc (the cartilage-like structures located between the bones of the back). In this surgery your surgeon removes a part of the boney arch that surrounds your spinal canal. This may be compressing nerve roots. In some cases, the surgeon will remove the disc and fuse (stick together) vertebral bodies (the bones of your back) to make the spine more stable. The type of procedure you will need is usually decided prior to surgery, however modifications may be necessary. The time in surgery depends on the findings in surgery and the procedure necessary to correct the problems.  DISCECTOMY  For people with disc problems, the surgeon removes the portion of the disc that is causing the pressure on the nerve root. Some surgeons perform a micro (small) discectomy, which may require removal of only a small portion of the lamina. A disc nucleus (center) may also be removed either through a needle (percutaneous discectomy) or by injecting an enzyme called chymopapain into the disc. Chymopapain is an enzyme that dissolves the disc. For people with back instability, the surgeon fuses vertebrae that are next to each other with tiny pieces of bone. These are used as bone grafts on the facets, or between the vertebrae. When this heals, the bones will no longer be able to move. These bone chips are often taken from the pelvic bones. Bones and bone grafts grow into one unit, stabilizing the segments of the spinal column.  LET YOUR CAREGIVER KNOW ABOUT:  · Allergies.  · Medicines taken including herbs, eyedrops, over-the-counter medicines, and creams.  · Use of  steroids (by mouth or creams).  · Previous problems with anesthetics or numbing medicine.  · Possibility of pregnancy, if this applies.  · History of blood clots (thrombophlebitis).  · History of bleeding or blood problems.  · Previous surgery.  · Other health problems.  RISKS AND COMPLICATIONS  Your caregiver will discuss possible risks and complications with you before surgery. In addition to the usual risks of anesthesia, other common risks and complications include:  · Blood loss and replacement.  · Temporary increase in pain due to surgery.  · Uncorrected back pain.  · Infection.  · New nerve damage (tingling, numbness, and pain).  BEFORE THE PROCEDURE  · Stop smoking at least 1 week prior to surgery. This lowers risk during surgery.  · Your caregiver may advise that you stop taking certain medicines that may affect the outcome of the surgery and your ability to heal. For example, you may need to stop taking anti-inflammatories, such as aspirin, because of possible bleeding problems. Other medicines may have interactions with anesthesia.  · Tell your caregiver if you have been on steroids for long periods of time. Often, additional steroids are administered intravenously before and during the procedure to prevent complications.  · You should be present 60 minutes prior to your procedure or as directed.  AFTER THE PROCEDURE  After surgery, you will be taken to the recovery area where a nurse will watch and check your progress. Generally, you will be allowed to go home within 1 week barring other problems.  HOME CARE INSTRUCTIONS   · Check the surgical cut (  incision) twice a day for signs of infection. Some signs may include a bad smelling, greenish or yellowish discharge from the wound; increased pain or increased redness over the incision site; an opening of the incision; flu-like symptoms; or a temperature above 101.5° F (38.6° C).  · Change your bandages in about 24 to 36 hours following surgery or as  directed.  · You may shower once the bandage is removed or as directed. Avoid bathtubs, swimming pools, and hot tubs for 3 weeks or until your incision has healed completely. If you have stitches (sutures) or staples they may be removed 2 to 3 weeks after surgery, or as directed by your caregiver.  · Follow your caregiver's instructions for activities, exercises, and physical therapy.  · Weight reduction may be beneficial if you are overweight.  · Walking is permitted. You may use a treadmill without an incline. Cut down on activities if you have discomfort. You may also go up and down stairs as tolerated.  · Do not lift anything heavier than 10 to 15 pounds. Avoid bending or twisting at the waist. Always bend your knees.  · Maintain strength and range of motion as instructed.  · No driving is permitted for 2 to 3 weeks, or as directed by your caregiver. You may be a passenger for 20 to 30 minute trips. Lying back in the passenger seat may be more comfortable for you.  · Limit your sitting to 20 to 30 minute intervals. You should lie down or walk in between sitting periods. There are no limitations for sitting in a recliner chair.  · Only take over-the-counter or prescription medicines for pain, discomfort, or fever as directed by your caregiver.  SEEK MEDICAL CARE IF:   · There is increased bleeding (more than a small spot) from the wound.  · You notice redness, swelling, or increasing pain in the wound.  · Pus is coming from the wound.  · An unexplained oral temperature above 102° F (38.9° C) develops.  · You notice a bad smell coming from the wound or dressing.  SEEK IMMEDIATE MEDICAL CARE IF:   · You develop a rash.  · You have difficulty breathing.  · You have any allergic problems.  Document Released: 11/14/2000 Document Revised: 04/03/2014 Document Reviewed: 09/12/2008  ExitCare® Patient Information ©2015 [employer]. This information is not intended to replace advice given to you by your health care  provider. Make sure you discuss any questions you have with your health care provider.

## 2014-10-18 NOTE — Progress Notes (Signed)
UR completed 

## 2014-10-19 ENCOUNTER — Ambulatory Visit (HOSPITAL_COMMUNITY): Payer: Self-pay | Admitting: Physical Therapy

## 2014-10-24 ENCOUNTER — Ambulatory Visit (HOSPITAL_COMMUNITY): Payer: Self-pay | Admitting: Physical Therapy

## 2014-10-25 ENCOUNTER — Ambulatory Visit (HOSPITAL_COMMUNITY): Payer: Self-pay | Admitting: Physical Therapy

## 2014-10-31 ENCOUNTER — Encounter (HOSPITAL_COMMUNITY): Payer: Self-pay | Admitting: Physical Therapy

## 2014-10-31 NOTE — Therapy (Unsigned)
  Patient Details  Name: Raymond Barrera MRN: [id number] Date of Birth: 12/30/1960  Encounter Date: 10/31/2014  PHYSICAL THERAPY DISCHARGE SUMMARY  Visits from Start of Care: 10  Patient having surgery 10/17/14  Current functional level related to goals / functional outcomes: Severe pain in low back without relief   Remaining deficits: PT Short Term Goal 1: Patient will report decreasing head aches to <3x per week with improved ability to sleep through night waking <2x on average per night PT Short Term Goal 1 - Progress: met PT Short Term Goal 2: Patient will displays improved cervical spine flexion to 60degrees to be able to look down while walking  not met PT Short Term Goal 3: Patient will display improved cervical spine extnsion of 40 degree to to be able to look up a cieling for work activities.   Not Met PT Short Term Goal 3 - Progress: Progressing toward goal  Not Met PT Short Term Goal 4: patient will be able to rotaate head bilaterally to 80degrees indicatign full upper trapezius and levator mobility   Not Met PT Short Term Goal 5: Patient will be able to perform deep neck flexor endurance test for 60 seconds withtou pain indicatign return to prior level of function  Not Met PT Long Term Goal 1: Patient will improve ankle dorsiflexion ROM to 20 degrees to demosntrate increased achilles/gastroc mobility to increase stride length.  Not Met PT Long Term Goal 2: Pt will improve his Lt ankle strength to 4/5 muscle grade to perform walking/and standing on tos to reach to high shelf  Not Met Long Term Goal 3: Pt will improve Lt ankle AROM to [facility] in order to ascend and descend steps with no difficulty   Not Met Long Term Goal 4: patient to ambulate 30 min with no pain for community related activities   Not Met PT Long Term Goal 5: Pt will improve his Lt ankle strength to 5/5 muscle grade to prepare return to sport/basketball   Not Met  Education / Equipment: HEP given.    Plan: Patient agrees to discharge.  Patient goals were not met. Patient is being discharged due to a change in medical status.  ?????       Devona Konig PT DPT [phone]

## 2014-12-08 NOTE — H&P (Signed)
  [facility] [address] [place], [place] [address] [phone] A Division of [facility]  Ninetta Lights, M.D.   Robert A. Noemi Chapel, M.D.   Faythe Casa, M.D.   Johnny Bridge, M.D.   Almedia Balls, M.D Ernesta Amble. Percell Miller, M.D.  Joseph Pierini, M.D.  Lanier Prude, M.D.    Verner Chol, M.D. Mary L. Fenton Malling, PA-C  Kirstin A. Shepperson, PA-C  Josh [place], PA-C [place], [place]   RE: Raymond, Barrera   [id number]      DOB: 26-Dec-1960 PROGRESS NOTE: 11-08-14 Reason for visit:  Right hip pain.  History of present illness: He's recently had a lumbar decompression with Dr. Kathyrn Sheriff a few weeks ago. He continues to have pain in the center of his buttocks and groin radiating down the front of his thigh. There is concern this is coming from his hip. He reports the pain down the back of his leg did improve with surgery but he continues to have this other pain.   Please see associated documentation for this clinic visit for further past medical, family, surgical and social history, review of systems, and exam findings as this was reviewed by me.  EXAMINATION: Well appearing male in no apparent distress. He has limited range of motion of his right hip with a positive Stinchfield test. No tenderness over the greater trochanteric bursa.  IMAGING: X-rays reviewed by me: 2 views of his pelvis demonstrate mild right hip joint space narrowing.    ASSESSMENT/PLAN: 1. I think there is a reasonable possibility his pain is from his hip joint.  2. I would like to confirm this with a cortisone injection to his hip joint and I will set this up as soon as possible.  3. If he gets significant relief from this we will consider treatment for hip arthritis if pain comes back. 4. If this does not provide relief we will investigate other sources of pain. Possibilities could be intra-abdominal pathology versus possible adjacent  level or other nerve compression. I am hopeful the injection will provide some relief and we can continue to treat hip pathology if it does.  Ernesta Amble.  Percell Miller, M.D.  Electronically verified by Ernesta Amble. Percell Miller, M.D. TDM:kah Cc:  Mary Sella, MD fax [phone] Cc:  Lucia Gaskins, MD fax [phone] D 11-08-14 T 11-09-14

## 2014-12-21 ENCOUNTER — Encounter (HOSPITAL_COMMUNITY)
Admission: RE | Admit: 2014-12-21 | Discharge: 2014-12-21 | Disposition: A | Discharge status: Home / Self Care | Payer: Medicaid Other | Source: Ambulatory Visit | Attending: Orthopedic Surgery | Admitting: Orthopedic Surgery

## 2014-12-21 ENCOUNTER — Encounter (HOSPITAL_COMMUNITY): Payer: Self-pay

## 2014-12-21 DIAGNOSIS — Z01818 Encounter for other preprocedural examination: Secondary | ICD-10-CM | POA: Diagnosis not present

## 2014-12-21 HISTORY — DX: Dorsalgia, unspecified: M54.9

## 2014-12-21 HISTORY — DX: Other specified postprocedural states: Z98.890

## 2014-12-21 HISTORY — DX: Effusion, unspecified joint: M25.40

## 2014-12-21 HISTORY — DX: Pain in unspecified joint: M25.50

## 2014-12-21 HISTORY — DX: Transient cerebral ischemic attack, unspecified: G45.9

## 2014-12-21 HISTORY — DX: Nausea with vomiting, unspecified: R11.2

## 2014-12-21 HISTORY — DX: Anxiety disorder, unspecified: F41.9

## 2014-12-21 LAB — PROTIME-INR
INR: 0.93 (ref 0.00–1.49)
Prothrombin Time: 12.5 seconds (ref 11.6–15.2)

## 2014-12-21 LAB — BASIC METABOLIC PANEL
ANION GAP: 10 (ref 5–15)
BUN: 14 mg/dL (ref 6–23)
CO2: 24 mmol/L (ref 19–32)
Calcium: 10 mg/dL (ref 8.4–10.5)
Chloride: 105 mEq/L (ref 96–112)
Creatinine, Ser: 1.14 mg/dL (ref 0.50–1.35)
GFR calc Af Amer: 82 mL/min — ABNORMAL LOW (ref >90)
GFR calc non Af Amer: 71 mL/min — ABNORMAL LOW (ref >90)
GLUCOSE: 112 mg/dL — AB (ref 70–99)
Potassium: 4.5 mmol/L (ref 3.5–5.1)
Sodium: 139 mmol/L (ref 135–145)

## 2014-12-21 LAB — URINALYSIS, ROUTINE W REFLEX MICROSCOPIC
BILIRUBIN URINE: NEGATIVE
GLUCOSE, UA: NEGATIVE mg/dL
Hgb urine dipstick: NEGATIVE
KETONES UR: NEGATIVE mg/dL
Leukocytes, UA: NEGATIVE
Nitrite: NEGATIVE
PROTEIN: 100 mg/dL — AB
Specific Gravity, Urine: >1.03 — ABNORMAL HIGH (ref 1.005–1.030)
Urobilinogen, UA: 0.2 mg/dL (ref 0.0–1.0)
pH: 5.5 (ref 5.0–8.0)

## 2014-12-21 LAB — CBC
HCT: 42.8 % (ref 39.0–52.0)
Hemoglobin: 14.1 g/dL (ref 13.0–17.0)
MCH: 28.8 pg (ref 26.0–34.0)
MCHC: 32.9 g/dL (ref 30.0–36.0)
MCV: 87.3 fL (ref 78.0–100.0)
Platelets: 261 10*3/uL (ref 150–400)
RBC: 4.9 MIL/uL (ref 4.22–5.81)
RDW: 14.2 % (ref 11.5–15.5)
WBC: 7.6 10*3/uL (ref 4.0–10.5)

## 2014-12-21 LAB — TYPE AND SCREEN
ABO/RH(D): O POS
Antibody Screen: NEGATIVE

## 2014-12-21 LAB — SURGICAL PCR SCREEN
MRSA, PCR: NEGATIVE
STAPHYLOCOCCUS AUREUS: NEGATIVE

## 2014-12-21 LAB — URINE MICROSCOPIC-ADD ON

## 2014-12-21 LAB — ABO/RH: ABO/RH(D): O POS

## 2014-12-21 MED ORDER — CHLORHEXIDINE GLUCONATE 4 % EX LIQD: 60.0000 mL | Freq: Once | CUTANEOUS | Status: DC

## 2014-12-21 NOTE — Pre-Procedure Instructions (Signed)
Raymond Barrera  12/21/2014   Your procedure is scheduled on:  Tues, Feb 2 @ 10:00 AM  Report to [facility] Entrance A  at 8:00 AM.  Call this number if you have problems the morning of surgery: [phone]   Remember:   Do not eat food or drink liquids after midnight.   Take these medicines the morning of surgery with A SIP OF WATER: Valium(Diazepam),Zofran(Ondansetron-if needed),and Pain Pill(if needed)              Stop taking your Meloxicam,Chromium,Vitamins,and any Herbal Medications. Also no Goody's,BC's,Aleve,Aspirin,Ibuprofen,or Fish Oil.   Do not wear jewelry  Do not wear lotions, powders, or colognes. You may wear deodorant.  Men may shave face and neck.  Do not bring valuables to the hospital.  [facility] is not responsible                  for any belongings or valuables.               Contacts, dentures or bridgework may not be worn into surgery.  Leave suitcase in the car. After surgery it may be brought to your room.  For patients admitted to the hospital, discharge time is determined by your                treatment team.                  Special Instructions:  [facility] - Preparing for Surgery  Before surgery, you can play an important role.  Because skin is not sterile, your skin needs to be as free of germs as possible.  You can reduce the number of germs on you skin by washing with CHG (chlorahexidine gluconate) soap before surgery.  CHG is an antiseptic cleaner which kills germs and bonds with the skin to continue killing germs even after washing.  Please DO NOT use if you have an allergy to CHG or antibacterial soaps.  If your skin becomes reddened/irritated stop using the CHG and inform your nurse when you arrive at Short Stay.  Do not shave (including legs and underarms) for at least 48 hours prior to the first CHG shower.  You may shave your face.  Please follow these instructions carefully:   1.  Shower with CHG Soap the night before surgery and the                                 morning of Surgery.  2.  If you choose to wash your hair, wash your hair first as usual with your       normal shampoo.  3.  After you shampoo, rinse your hair and body thoroughly to remove the                      Shampoo.  4.  Use CHG as you would any other liquid soap.  You can apply chg directly       to the skin and wash gently with scrungie or a clean washcloth.  5.  Apply the CHG Soap to your body ONLY FROM THE NECK DOWN.        Do not use on open wounds or open sores.  Avoid contact with your eyes,       ears, mouth and genitals (private parts).  Wash genitals (private parts)       with your  normal soap.  6.  Wash thoroughly, paying special attention to the area where your surgery        will be performed.  7.  Thoroughly rinse your body with warm water from the neck down.  8.  DO NOT shower/wash with your normal soap after using and rinsing off       the CHG Soap.  9.  Pat yourself dry with a clean towel.            10.  Wear clean pajamas.            11.  Place clean sheets on your bed the night of your first shower and do not        sleep with pets.  Day of Surgery  Do not apply any lotions/deoderants the morning of surgery.  Please wear clean clothes to the hospital/surgery center.     Please read over the following fact sheets that you were given: Pain Booklet, Coughing and Deep Breathing, Blood Transfusion Information, MRSA Information and Surgical Site Infection Prevention

## 2014-12-21 NOTE — Progress Notes (Signed)
   12/21/14 1021  OBSTRUCTIVE SLEEP APNEA  Have you ever been diagnosed with sleep apnea through a sleep study? No  Do you snore loudly (loud enough to be heard through closed doors)?  1  Do you often feel tired, fatigued, or sleepy during the daytime? 0  Has anyone observed you stop breathing during your sleep? 1  Do you have, or are you being treated for high blood pressure? 1  BMI more than 35 kg/m2? 0  Age over 56 years old? 1  Neck circumference greater than 40 cm/16 inches? 1  Gender: 1  Obstructive Sleep Apnea Score 6  Score 4 or greater  Results sent to PCP

## 2014-12-21 NOTE — Progress Notes (Addendum)
EKG and CXR in epic from 04-04-14  Echo report in epic from 2014  Pt doesn't have a cardiologist   Denies ever having a stress test/heart cath  Dr.Don [facility] is Medical MD

## 2014-12-22 LAB — URINE CULTURE
COLONY COUNT: NO GROWTH
CULTURE: NO GROWTH

## 2015-01-02 ENCOUNTER — Inpatient Hospital Stay (HOSPITAL_COMMUNITY): Admission: RE | Admit: 2015-01-02 | Payer: Medicaid Other | Source: Ambulatory Visit | Admitting: Orthopedic Surgery

## 2015-01-02 ENCOUNTER — Encounter (HOSPITAL_COMMUNITY): Admission: RE | Payer: Self-pay | Source: Ambulatory Visit

## 2015-01-02 SURGERY — ARTHROPLASTY, HIP, TOTAL, ANTERIOR APPROACH
Anesthesia: General | Site: Hip | Laterality: Right

## 2015-02-26 DIAGNOSIS — M1611 Unilateral primary osteoarthritis, right hip: Secondary | ICD-10-CM | POA: Diagnosis present

## 2015-02-26 NOTE — H&P (Signed)
TOTAL HIP ADMISSION H&P  Patient is admitted for right total hip arthroplasty.  Subjective:  Chief Complaint: right hip pain  HPI: Raymond Barrera, 54 y.o. male, has a history of pain and functional disability in the right hip(s) due to arthritis and patient has failed non-surgical conservative treatments for greater than 12 weeks to include NSAID's and/or analgesics, corticosteriod injections, supervised PT with diminished ADL's post treatment, use of assistive devices and activity modification.  Onset of symptoms was gradual starting 2 years ago with gradually worsening course since that time.The patient noted no past surgery on the right hip(s).  Patient currently rates pain in the right hip at 10 out of 10 with activity. Patient has night pain, worsening of pain with activity and weight bearing, pain that interfers with activities of daily living and pain with passive range of motion. Patient has evidence of joint space narrowing by imaging studies. This condition presents safety issues increasing the risk of falls.  There is no current active infection.  Patient Active Problem List   Diagnosis Date Noted  . Lumbar spondylosis 10/17/2014  . Lumbago 07/25/2014  . Abnormality of gait 07/25/2014  . Difficulty in walking(719.7) 07/25/2014  . TIA (transient ischemic attack) 03/09/2013  . Cocaine abuse 03/09/2013  . Accelerated hypertension 03/09/2013  . Other and unspecified hyperlipidemia 03/09/2013  . Current smoker 03/09/2013   Past Medical History  Diagnosis Date  . High cholesterol     takes Zocor daily  . Stroke 08/2013  . Arthritis   . Headache     migraines  . Anxiety     takes Valium daily as needed  . Diabetes mellitus without complication     takes Metformin daily  . Hypertension     takes Benazepril and HCTZ  daily  . PONV (postoperative nausea and vomiting)   . Myocardial infarction 1987  . TIA (transient ischemic attack) 2014    x 7   . Pneumonia     hx of-80's   . Joint pain   . Joint swelling   . Back pain     hx of buldging disc    Past Surgical History  Procedure Laterality Date  . Ankle surgery  2008    left ankle-otif-[facility]  . Mass excision  09/13/2012    Procedure: EXCISION MASS;  Surgeon: Jamesetta So, MD;  Location: [facility];  Service: General;  Laterality: N/A;  . Tonsillectomy    . Lumbar laminectomy/decompression microdiscectomy Right 10/17/2014    Procedure: LUMBAR LAMINECTOMY/DECOMPRESSION MICRODISCECTOMY 2 LEVELS;  Surgeon: [name], MD;  Location: [facility];  Service: Neurosurgery;  Laterality: Right;  Right L45 L5S1 laminectomy and foraminotomy    No prescriptions prior to admission   Allergies  Allergen Reactions  . Poison Ivy Extract [Extract Of Poison Ivy] Itching and Rash    History  Substance Use Topics  . Smoking status: Current Every Day Smoker -- 0.75 packs/day for 47 years    Types: Cigarettes  . Smokeless tobacco: Never Used  . Alcohol Use: No     Comment: no alcohol in 2 yrs    No family history on file.   Review of Systems  Constitutional: Negative for fever and chills.  HENT: Negative for ear pain.   Eyes: Negative for blurred vision and double vision.  Respiratory: Negative for cough and wheezing.   Cardiovascular: Negative for chest pain and palpitations.  Gastrointestinal: Negative for nausea and vomiting.  Musculoskeletal:       Right hip  pain and decreased ROM  Neurological: Negative for dizziness, seizures and headaches.  Psychiatric/Behavioral: Negative for depression and suicidal ideas.    Objective:  Physical Exam  Vitals reviewed. Constitutional: He is oriented to person, place, and time. He appears well-developed and well-nourished.  HENT:  Head: Normocephalic and atraumatic.  Eyes: Conjunctivae and EOM are normal. Pupils are equal, round, and reactive to light.  Neck: Normal range of motion. Neck supple.  Cardiovascular: Normal rate and intact distal pulses.    Respiratory: Effort normal and breath sounds normal.  GI: Soft. Bowel sounds are normal.  Musculoskeletal: He exhibits tenderness (right hip).  Pain with internal and external rotation of the right hip  Neurological: He is alert and oriented to person, place, and time.  Skin: Skin is warm and dry.  Psychiatric: He has a normal mood and affect. His behavior is normal. Judgment and thought content normal.    Vital signs in last 24 hours:    Labs:   Estimated body mass index is 30.83 kg/(m^2) as calculated from the following:   Height as of 12/21/14: 6\' 6"  (1.981 m).   Weight as of 12/21/14: 121 kg (266 lb 12.1 oz).   Imaging Review Plain radiographs demonstrate severe degenerative joint disease of the right hip(s). The bone quality appears to be fair for age and reported activity level.  Assessment/Plan:  End stage arthritis, right hip(s)  The patient history, physical examination, clinical judgement of the provider and imaging studies are consistent with end stage degenerative joint disease of the right hip(s) and total hip arthroplasty is deemed medically necessary. The treatment options including medical management, injection therapy, arthroscopy and arthroplasty were discussed at length. The risks and benefits of total hip arthroplasty were presented and reviewed. The risks due to aseptic loosening, infection, stiffness, dislocation/subluxation,  thromboembolic complications and other imponderables were discussed.  The patient acknowledged the explanation, agreed to proceed with the plan and consent was signed. Patient is being admitted for inpatient treatment for surgery, pain control, PT, OT, prophylactic antibiotics, VTE prophylaxis, progressive ambulation and ADL's and discharge planning.The patient is planning to be discharged home with home health services

## 2015-03-07 ENCOUNTER — Other Ambulatory Visit (HOSPITAL_COMMUNITY): Payer: Self-pay | Admitting: *Deleted

## 2015-03-07 NOTE — Pre-Procedure Instructions (Signed)
Raymond Barrera  03/07/2015   Your procedure is scheduled on:  Tuesday, March 20, 2015 at 7:30 AM.   Report to [facility] Entrance "A" Admitting Office at 5:30 AM.   Call this number if you have problems the morning of surgery: [phone]               Any questions prior to day of surgery, please call [phone] between 8 & 4 PM.   Remember:   Do not eat food or drink liquids after midnight Monday, 03/19/15.   Take these medicines the morning of surgery with A SIP OF WATER: Diazepam (Valium) - if needed, Ondansetron (Zofran) - if needed, Oxycodone (Percocet) - if needed.  Stop Aspirin, Fish Oil, Herbal Medications and NSAIDS (Meloxicam, Ibuprofen, Aleve, etc.) 7 days prior to surgery.  Do NOT take diabetic medications the morning of surgery.   Do not wear jewelry.  Do not wear lotions, powders, or cologne. You may wear deodorant.  Men may shave face and neck.  Do not bring valuables to the hospital.  [facility] is not responsible                  for any belongings or valuables.               Contacts, dentures or bridgework may not be worn into surgery.  Leave suitcase in the car. After surgery it may be brought to your room.  For patients admitted to the hospital, discharge time is determined by your                treatment team.               Special Instructions: See "Preparing for Surgery" Instruction sheet.   Please read over the following fact sheets that you were given: Pain Booklet, Coughing and Deep Breathing, Blood Transfusion Information, MRSA Information and Surgical Site Infection Prevention

## 2015-03-08 ENCOUNTER — Inpatient Hospital Stay (HOSPITAL_COMMUNITY)
Admission: RE | Admit: 2015-03-08 | Discharge: 2015-03-08 | Disposition: A | Discharge status: Home / Self Care | Payer: Medicaid Other | Source: Ambulatory Visit | Admitting: Orthopedic Surgery

## 2015-03-08 ENCOUNTER — Ambulatory Visit (HOSPITAL_COMMUNITY)
Admission: RE | Admit: 2015-03-08 | Discharge: 2015-03-08 | DRG: 554 | Disposition: A | Discharge status: Home / Self Care | Payer: Medicaid Other | Source: Ambulatory Visit | Attending: Family Medicine | Admitting: Family Medicine

## 2015-03-08 ENCOUNTER — Other Ambulatory Visit (HOSPITAL_COMMUNITY): Payer: Self-pay

## 2015-03-08 DIAGNOSIS — Z01812 Encounter for preprocedural laboratory examination: Secondary | ICD-10-CM | POA: Diagnosis not present

## 2015-03-08 DIAGNOSIS — F17210 Nicotine dependence, cigarettes, uncomplicated: Secondary | ICD-10-CM | POA: Insufficient documentation

## 2015-03-08 DIAGNOSIS — M1611 Unilateral primary osteoarthritis, right hip: Secondary | ICD-10-CM | POA: Diagnosis present

## 2015-03-08 DIAGNOSIS — Z8673 Personal history of transient ischemic attack (TIA), and cerebral infarction without residual deficits: Secondary | ICD-10-CM | POA: Diagnosis not present

## 2015-03-08 DIAGNOSIS — I252 Old myocardial infarction: Secondary | ICD-10-CM | POA: Diagnosis not present

## 2015-03-08 DIAGNOSIS — E119 Type 2 diabetes mellitus without complications: Secondary | ICD-10-CM | POA: Insufficient documentation

## 2015-03-08 DIAGNOSIS — I10 Essential (primary) hypertension: Secondary | ICD-10-CM | POA: Insufficient documentation

## 2015-03-08 DIAGNOSIS — E780 Pure hypercholesterolemia: Secondary | ICD-10-CM | POA: Diagnosis not present

## 2015-03-08 LAB — URINALYSIS, ROUTINE W REFLEX MICROSCOPIC
BILIRUBIN URINE: NEGATIVE
GLUCOSE, UA: 100 mg/dL — AB
Hgb urine dipstick: NEGATIVE
Ketones, ur: NEGATIVE mg/dL
Leukocytes, UA: NEGATIVE
Nitrite: NEGATIVE
PH: 5 (ref 5.0–8.0)
Protein, ur: 30 mg/dL — AB
Specific Gravity, Urine: 1.023 (ref 1.005–1.030)
Urobilinogen, UA: 1 mg/dL (ref 0.0–1.0)

## 2015-03-08 LAB — CBC
HEMATOCRIT: 44.8 % (ref 39.0–52.0)
HEMOGLOBIN: 15.1 g/dL (ref 13.0–17.0)
MCH: 29.6 pg (ref 26.0–34.0)
MCHC: 33.7 g/dL (ref 30.0–36.0)
MCV: 87.8 fL (ref 78.0–100.0)
PLATELETS: 207 10*3/uL (ref 150–400)
RBC: 5.1 MIL/uL (ref 4.22–5.81)
RDW: 13.5 % (ref 11.5–15.5)
WBC: 6.7 10*3/uL (ref 4.0–10.5)

## 2015-03-08 LAB — PROTIME-INR
INR: 0.96 (ref 0.00–1.49)
Prothrombin Time: 12.9 seconds (ref 11.6–15.2)

## 2015-03-08 LAB — URINE MICROSCOPIC-ADD ON

## 2015-03-08 LAB — SURGICAL PCR SCREEN
MRSA, PCR: NEGATIVE
Staphylococcus aureus: NEGATIVE

## 2015-03-08 LAB — BASIC METABOLIC PANEL
ANION GAP: 10 (ref 5–15)
BUN: 13 mg/dL (ref 6–23)
CO2: 24 mmol/L (ref 19–32)
Calcium: 10.2 mg/dL (ref 8.4–10.5)
Chloride: 105 mmol/L (ref 96–112)
Creatinine, Ser: 1.18 mg/dL (ref 0.50–1.35)
GFR calc Af Amer: 78 mL/min — ABNORMAL LOW (ref >90)
GFR, EST NON AFRICAN AMERICAN: 67 mL/min — AB (ref >90)
GLUCOSE: 163 mg/dL — AB (ref 70–99)
POTASSIUM: 4.1 mmol/L (ref 3.5–5.1)
SODIUM: 139 mmol/L (ref 135–145)

## 2015-03-08 LAB — TYPE AND SCREEN
ABO/RH(D): O POS
Antibody Screen: NEGATIVE

## 2015-03-08 MED ORDER — CHLORHEXIDINE GLUCONATE 4 % EX LIQD: 60.0000 mL | Freq: Once | CUTANEOUS | Status: DC

## 2015-03-09 LAB — URINE CULTURE
Colony Count: NO GROWTH
Culture: NO GROWTH

## 2015-03-19 MED ORDER — TRANEXAMIC ACID 100 MG/ML IV SOLN
2000.0000 mg | Freq: Once | INTRAVENOUS | Status: AC
  Administered 2015-03-20: 2000 mg via TOPICAL
  Filled 2015-03-19: qty 20

## 2015-03-19 MED ORDER — POTASSIUM CHLORIDE IN NACL 20-0.45 MEQ/L-% IV SOLN
INTRAVENOUS | Status: DC
  Filled 2015-03-19 (×3): qty 1000

## 2015-03-19 MED ORDER — DEXTROSE 5 % IV SOLN
3.0000 g | INTRAVENOUS | Status: AC
  Administered 2015-03-20: 3 g via INTRAVENOUS
  Filled 2015-03-19: qty 3000

## 2015-03-19 MED ORDER — ACETAMINOPHEN 500 MG PO TABS: 1000.0000 mg | ORAL_TABLET | Freq: Once | ORAL | Status: DC

## 2015-03-20 ENCOUNTER — Encounter (HOSPITAL_COMMUNITY)
Admission: RE | Disposition: A | Discharge status: Home Health | Payer: Self-pay | Source: Ambulatory Visit | Attending: Orthopedic Surgery

## 2015-03-20 ENCOUNTER — Inpatient Hospital Stay (HOSPITAL_COMMUNITY)
Admission: RE | Admit: 2015-03-20 | Discharge: 2015-03-21 | DRG: 470 | Disposition: A | Discharge status: Home Health | Payer: Medicaid Other | Source: Ambulatory Visit | Attending: Orthopedic Surgery | Admitting: Orthopedic Surgery

## 2015-03-20 ENCOUNTER — Inpatient Hospital Stay (HOSPITAL_COMMUNITY): Payer: Medicaid Other | Admitting: Anesthesiology

## 2015-03-20 ENCOUNTER — Inpatient Hospital Stay (HOSPITAL_COMMUNITY): Payer: Medicaid Other

## 2015-03-20 ENCOUNTER — Encounter (HOSPITAL_COMMUNITY): Payer: Self-pay

## 2015-03-20 DIAGNOSIS — M25551 Pain in right hip: Secondary | ICD-10-CM | POA: Diagnosis present

## 2015-03-20 DIAGNOSIS — Z8673 Personal history of transient ischemic attack (TIA), and cerebral infarction without residual deficits: Secondary | ICD-10-CM | POA: Diagnosis not present

## 2015-03-20 DIAGNOSIS — F419 Anxiety disorder, unspecified: Secondary | ICD-10-CM | POA: Diagnosis present

## 2015-03-20 DIAGNOSIS — I10 Essential (primary) hypertension: Secondary | ICD-10-CM | POA: Diagnosis not present

## 2015-03-20 DIAGNOSIS — M1611 Unilateral primary osteoarthritis, right hip: Principal | ICD-10-CM | POA: Diagnosis present

## 2015-03-20 DIAGNOSIS — Z7982 Long term (current) use of aspirin: Secondary | ICD-10-CM | POA: Diagnosis not present

## 2015-03-20 DIAGNOSIS — F17210 Nicotine dependence, cigarettes, uncomplicated: Secondary | ICD-10-CM | POA: Diagnosis present

## 2015-03-20 DIAGNOSIS — I252 Old myocardial infarction: Secondary | ICD-10-CM

## 2015-03-20 DIAGNOSIS — E119 Type 2 diabetes mellitus without complications: Secondary | ICD-10-CM | POA: Diagnosis not present

## 2015-03-20 DIAGNOSIS — E780 Pure hypercholesterolemia: Secondary | ICD-10-CM | POA: Diagnosis present

## 2015-03-20 DIAGNOSIS — M1990 Unspecified osteoarthritis, unspecified site: Secondary | ICD-10-CM | POA: Diagnosis present

## 2015-03-20 DIAGNOSIS — E784 Other hyperlipidemia: Secondary | ICD-10-CM | POA: Diagnosis not present

## 2015-03-20 DIAGNOSIS — Z96649 Presence of unspecified artificial hip joint: Secondary | ICD-10-CM

## 2015-03-20 DIAGNOSIS — Z79899 Other long term (current) drug therapy: Secondary | ICD-10-CM

## 2015-03-20 HISTORY — PX: TOTAL HIP ARTHROPLASTY: SHX124

## 2015-03-20 LAB — GLUCOSE, CAPILLARY
GLUCOSE-CAPILLARY: 185 mg/dL — AB (ref 70–99)
Glucose-Capillary: 116 mg/dL — ABNORMAL HIGH (ref 70–99)
Glucose-Capillary: 192 mg/dL — ABNORMAL HIGH (ref 70–99)
Glucose-Capillary: 214 mg/dL — ABNORMAL HIGH (ref 70–99)
Glucose-Capillary: 160 mg/dL — ABNORMAL HIGH (ref 70–99)

## 2015-03-20 SURGERY — ARTHROPLASTY, HIP, TOTAL, ANTERIOR APPROACH
Anesthesia: General | Site: Hip | Laterality: Right

## 2015-03-20 MED ORDER — ASPIRIN 325 MG PO TABS: 325.0000 mg | ORAL_TABLET | Freq: Every day | ORAL | Status: DC

## 2015-03-20 MED ORDER — EPHEDRINE SULFATE 50 MG/ML IJ SOLN
INTRAMUSCULAR | Status: DC | PRN
  Administered 2015-03-20 (×2): 10 mg via INTRAVENOUS

## 2015-03-20 MED ORDER — MIDAZOLAM HCL 2 MG/2ML IJ SOLN
INTRAMUSCULAR | Status: AC
  Filled 2015-03-20: qty 2

## 2015-03-20 MED ORDER — OXYCODONE HCL 5 MG PO TABS: 5.0000 mg | ORAL_TABLET | Freq: Once | ORAL | Status: DC | PRN

## 2015-03-20 MED ORDER — DOCUSATE SODIUM 100 MG PO CAPS
100.0000 mg | ORAL_CAPSULE | Freq: Two times a day (BID) | ORAL | Status: DC
  Administered 2015-03-20 – 2015-03-21 (×2): 100 mg via ORAL
  Filled 2015-03-20 (×2): qty 1

## 2015-03-20 MED ORDER — LACTATED RINGERS IV SOLN
INTRAVENOUS | Status: DC | PRN
  Administered 2015-03-20 (×2): via INTRAVENOUS

## 2015-03-20 MED ORDER — FENTANYL CITRATE (PF) 250 MCG/5ML IJ SOLN
INTRAMUSCULAR | Status: AC
  Filled 2015-03-20: qty 5

## 2015-03-20 MED ORDER — OXYCODONE HCL 5 MG/5ML PO SOLN: 5.0000 mg | Freq: Once | ORAL | Status: DC | PRN

## 2015-03-20 MED ORDER — PROPOFOL 10 MG/ML IV BOLUS
INTRAVENOUS | Status: DC | PRN
  Administered 2015-03-20: 200 mg via INTRAVENOUS

## 2015-03-20 MED ORDER — PHENYLEPHRINE 40 MCG/ML (10ML) SYRINGE FOR IV PUSH (FOR BLOOD PRESSURE SUPPORT)
PREFILLED_SYRINGE | INTRAVENOUS | Status: AC
  Filled 2015-03-20: qty 10

## 2015-03-20 MED ORDER — DIAZEPAM 5 MG PO TABS: 5.0000 mg | ORAL_TABLET | Freq: Four times a day (QID) | ORAL | Status: DC | PRN

## 2015-03-20 MED ORDER — ACETAMINOPHEN 650 MG RE SUPP
650.0000 mg | Freq: Four times a day (QID) | RECTAL | Status: DC | PRN
Start: 2015-03-20 — End: 2015-03-21

## 2015-03-20 MED ORDER — HYDROMORPHONE HCL 1 MG/ML IJ SOLN
INTRAMUSCULAR | Status: AC
  Filled 2015-03-20: qty 1

## 2015-03-20 MED ORDER — HYDROCHLOROTHIAZIDE 25 MG PO TABS
25.0000 mg | ORAL_TABLET | Freq: Every day | ORAL | Status: DC
  Administered 2015-03-21: 25 mg via ORAL
  Filled 2015-03-20: qty 1

## 2015-03-20 MED ORDER — CEFAZOLIN SODIUM-DEXTROSE 2-3 GM-% IV SOLR
2.0000 g | Freq: Four times a day (QID) | INTRAVENOUS | Status: AC
  Administered 2015-03-20 (×2): 2 g via INTRAVENOUS
  Filled 2015-03-20 (×2): qty 50

## 2015-03-20 MED ORDER — ROCURONIUM BROMIDE 50 MG/5ML IV SOLN
INTRAVENOUS | Status: AC
  Filled 2015-03-20: qty 1

## 2015-03-20 MED ORDER — MIDAZOLAM HCL 5 MG/5ML IJ SOLN
INTRAMUSCULAR | Status: DC | PRN
  Administered 2015-03-20: 2 mg via INTRAVENOUS

## 2015-03-20 MED ORDER — PHENOL 1.4 % MT LIQD: 1.0000 | OROMUCOSAL | Status: DC | PRN

## 2015-03-20 MED ORDER — DOCUSATE SODIUM 100 MG PO CAPS: 100.0000 mg | ORAL_CAPSULE | Freq: Two times a day (BID) | ORAL | Status: DC

## 2015-03-20 MED ORDER — FENTANYL CITRATE (PF) 100 MCG/2ML IJ SOLN
INTRAMUSCULAR | Status: DC | PRN
  Administered 2015-03-20 (×5): 50 ug via INTRAVENOUS

## 2015-03-20 MED ORDER — HYDROMORPHONE HCL 1 MG/ML IJ SOLN
1.0000 mg | INTRAMUSCULAR | Status: DC | PRN
  Administered 2015-03-20 – 2015-03-21 (×5): 1 mg via INTRAVENOUS
  Filled 2015-03-20 (×5): qty 1

## 2015-03-20 MED ORDER — BUPIVACAINE LIPOSOME 1.3 % IJ SUSP
INTRAMUSCULAR | Status: DC | PRN
  Administered 2015-03-20: 20 mL

## 2015-03-20 MED ORDER — METFORMIN HCL 500 MG PO TABS
500.0000 mg | ORAL_TABLET | Freq: Two times a day (BID) | ORAL | Status: DC
  Administered 2015-03-20 – 2015-03-21 (×2): 500 mg via ORAL
  Filled 2015-03-20 (×3): qty 1

## 2015-03-20 MED ORDER — CELECOXIB 100 MG PO CAPS: 100.0000 mg | ORAL_CAPSULE | Freq: Two times a day (BID) | ORAL | Status: DC

## 2015-03-20 MED ORDER — EPHEDRINE SULFATE 50 MG/ML IJ SOLN
INTRAMUSCULAR | Status: AC
  Filled 2015-03-20: qty 1

## 2015-03-20 MED ORDER — CELECOXIB 200 MG PO CAPS
200.0000 mg | ORAL_CAPSULE | Freq: Two times a day (BID) | ORAL | Status: DC
  Administered 2015-03-20 – 2015-03-21 (×3): 200 mg via ORAL
  Filled 2015-03-20 (×3): qty 1

## 2015-03-20 MED ORDER — DIPHENHYDRAMINE HCL 50 MG/ML IJ SOLN
INTRAMUSCULAR | Status: DC | PRN
  Administered 2015-03-20: 12.5 mg via INTRAVENOUS

## 2015-03-20 MED ORDER — ROCURONIUM BROMIDE 100 MG/10ML IV SOLN
INTRAVENOUS | Status: DC | PRN
  Administered 2015-03-20: 50 mg via INTRAVENOUS

## 2015-03-20 MED ORDER — HYDROMORPHONE HCL 1 MG/ML IJ SOLN
0.2500 mg | INTRAMUSCULAR | Status: DC | PRN
  Administered 2015-03-20 (×4): 0.5 mg via INTRAVENOUS

## 2015-03-20 MED ORDER — BENAZEPRIL HCL 20 MG PO TABS
10.0000 mg | ORAL_TABLET | Freq: Every day | ORAL | Status: DC
  Administered 2015-03-21: 10 mg via ORAL
  Filled 2015-03-20: qty 1

## 2015-03-20 MED ORDER — ONDANSETRON HCL 4 MG PO TABS: 4.0000 mg | ORAL_TABLET | Freq: Four times a day (QID) | ORAL | Status: DC | PRN

## 2015-03-20 MED ORDER — METHOCARBAMOL 1000 MG/10ML IJ SOLN
500.0000 mg | Freq: Four times a day (QID) | INTRAVENOUS | Status: DC | PRN
  Filled 2015-03-20: qty 5

## 2015-03-20 MED ORDER — METHOCARBAMOL 500 MG PO TABS
500.0000 mg | ORAL_TABLET | Freq: Four times a day (QID) | ORAL | Status: DC | PRN
  Administered 2015-03-20 – 2015-03-21 (×2): 500 mg via ORAL
  Filled 2015-03-20 (×2): qty 1

## 2015-03-20 MED ORDER — PHENYLEPHRINE HCL 10 MG/ML IJ SOLN
10.0000 mg | INTRAVENOUS | Status: DC | PRN
  Administered 2015-03-20: 50 ug/min via INTRAVENOUS

## 2015-03-20 MED ORDER — ONDANSETRON HCL 4 MG/2ML IJ SOLN
4.0000 mg | Freq: Four times a day (QID) | INTRAMUSCULAR | Status: DC | PRN
  Administered 2015-03-20 – 2015-03-21 (×3): 4 mg via INTRAVENOUS
  Filled 2015-03-20 (×4): qty 2

## 2015-03-20 MED ORDER — OXYCODONE-ACETAMINOPHEN 10-325 MG PO TABS: 1.0000 | ORAL_TABLET | ORAL | Status: DC | PRN

## 2015-03-20 MED ORDER — OXYCODONE HCL 5 MG PO TABS
10.0000 mg | ORAL_TABLET | ORAL | Status: DC | PRN
  Administered 2015-03-20 – 2015-03-21 (×3): 10 mg via ORAL
  Filled 2015-03-20 (×4): qty 2

## 2015-03-20 MED ORDER — LIDOCAINE HCL (CARDIAC) 20 MG/ML IV SOLN
INTRAVENOUS | Status: DC | PRN
  Administered 2015-03-20: 100 mg via INTRAVENOUS

## 2015-03-20 MED ORDER — METOCLOPRAMIDE HCL 5 MG PO TABS: 5.0000 mg | ORAL_TABLET | Freq: Three times a day (TID) | ORAL | Status: DC | PRN

## 2015-03-20 MED ORDER — LIDOCAINE HCL (CARDIAC) 20 MG/ML IV SOLN
INTRAVENOUS | Status: AC
  Filled 2015-03-20: qty 5

## 2015-03-20 MED ORDER — MENTHOL 3 MG MT LOZG: 1.0000 | LOZENGE | OROMUCOSAL | Status: DC | PRN

## 2015-03-20 MED ORDER — SIMVASTATIN 40 MG PO TABS
40.0000 mg | ORAL_TABLET | Freq: Every day | ORAL | Status: DC
  Administered 2015-03-20: 40 mg via ORAL
  Filled 2015-03-20 (×2): qty 1

## 2015-03-20 MED ORDER — PROPOFOL 10 MG/ML IV BOLUS
INTRAVENOUS | Status: AC
  Filled 2015-03-20: qty 20

## 2015-03-20 MED ORDER — HYDROMORPHONE HCL 1 MG/ML IJ SOLN
INTRAMUSCULAR | Status: AC
Start: 2015-03-20 — End: 2015-03-20
  Filled 2015-03-20: qty 1

## 2015-03-20 MED ORDER — 0.9 % SODIUM CHLORIDE (POUR BTL) OPTIME
TOPICAL | Status: DC | PRN
  Administered 2015-03-20: 1000 mL

## 2015-03-20 MED ORDER — POTASSIUM CHLORIDE IN NACL 20-0.45 MEQ/L-% IV SOLN
INTRAVENOUS | Status: AC
  Administered 2015-03-20: 15:00:00 via INTRAVENOUS
  Filled 2015-03-20 (×2): qty 1000

## 2015-03-20 MED ORDER — PHENYLEPHRINE HCL 10 MG/ML IJ SOLN
INTRAMUSCULAR | Status: DC | PRN
  Administered 2015-03-20 (×4): 80 ug via INTRAVENOUS

## 2015-03-20 MED ORDER — BUPIVACAINE LIPOSOME 1.3 % IJ SUSP
20.0000 mL | Freq: Once | INTRAMUSCULAR | Status: DC
  Filled 2015-03-20: qty 20

## 2015-03-20 MED ORDER — ONDANSETRON HCL 4 MG/2ML IJ SOLN: 4.0000 mg | Freq: Four times a day (QID) | INTRAMUSCULAR | Status: DC | PRN

## 2015-03-20 MED ORDER — DEXAMETHASONE SODIUM PHOSPHATE 10 MG/ML IJ SOLN
10.0000 mg | Freq: Once | INTRAMUSCULAR | Status: AC
  Administered 2015-03-21: 10 mg via INTRAVENOUS
  Filled 2015-03-20: qty 1

## 2015-03-20 MED ORDER — METHOCARBAMOL 500 MG PO TABS: 500.0000 mg | ORAL_TABLET | Freq: Four times a day (QID) | ORAL | Status: DC

## 2015-03-20 MED ORDER — METOCLOPRAMIDE HCL 5 MG/ML IJ SOLN
5.0000 mg | Freq: Three times a day (TID) | INTRAMUSCULAR | Status: DC | PRN
  Administered 2015-03-20 – 2015-03-21 (×2): 10 mg via INTRAVENOUS
  Filled 2015-03-20 (×2): qty 2

## 2015-03-20 MED ORDER — ONDANSETRON HCL 4 MG/2ML IJ SOLN
INTRAMUSCULAR | Status: DC | PRN
  Administered 2015-03-20: 4 mg via INTRAVENOUS

## 2015-03-20 MED ORDER — ACETAMINOPHEN 325 MG PO TABS: 650.0000 mg | ORAL_TABLET | Freq: Four times a day (QID) | ORAL | Status: DC | PRN

## 2015-03-20 MED ORDER — ASPIRIN EC 325 MG PO TBEC
325.0000 mg | DELAYED_RELEASE_TABLET | Freq: Every day | ORAL | Status: DC
  Administered 2015-03-21: 325 mg via ORAL
  Filled 2015-03-20: qty 1

## 2015-03-20 SURGICAL SUPPLY — 54 items
BAG DECANTER FOR FLEXI CONT (MISCELLANEOUS) ×2 IMPLANT
BLADE SAW SGTL 18X1.27X75 (BLADE) ×2 IMPLANT
CAPT HIP TOTAL 2 ×1 IMPLANT
COVER SURGICAL LIGHT HANDLE (MISCELLANEOUS) ×2 IMPLANT
DRAPE IMP U-DRAPE 54X76 (DRAPES) ×2 IMPLANT
DRAPE INCISE IOBAN 66X45 STRL (DRAPES) ×2 IMPLANT
DRAPE ORTHO SPLIT 77X108 STRL (DRAPES) ×4
DRAPE SURG ORHT 6 SPLT 77X108 (DRAPES) ×2 IMPLANT
DRAPE U-SHAPE 47X51 STRL (DRAPES) ×2 IMPLANT
DRSG AQUACEL AG ADV 3.5X10 (GAUZE/BANDAGES/DRESSINGS) ×2 IMPLANT
DRSG MEPILEX BORDER 4X8 (GAUZE/BANDAGES/DRESSINGS) ×1 IMPLANT
DURAPREP 26ML APPLICATOR (WOUND CARE) ×2 IMPLANT
ELECT BLADE 4.0 EZ CLEAN MEGAD (MISCELLANEOUS) ×2
ELECT REM PT RETURN 9FT ADLT (ELECTROSURGICAL) ×2
ELECTRODE BLDE 4.0 EZ CLN MEGD (MISCELLANEOUS) ×1 IMPLANT
ELECTRODE REM PT RTRN 9FT ADLT (ELECTROSURGICAL) ×1 IMPLANT
FACESHIELD WRAPAROUND (MASK) ×4 IMPLANT
FACESHIELD WRAPAROUND OR TEAM (MASK) ×2 IMPLANT
GLOVE BIO SURGEON STRL SZ7 (GLOVE) ×3 IMPLANT
GLOVE BIO SURGEON STRL SZ7.5 (GLOVE) ×3 IMPLANT
GLOVE BIOGEL PI IND STRL 6.5 (GLOVE) IMPLANT
GLOVE BIOGEL PI IND STRL 7.0 (GLOVE) ×1 IMPLANT
GLOVE BIOGEL PI IND STRL 8 (GLOVE) ×1 IMPLANT
GLOVE BIOGEL PI INDICATOR 6.5 (GLOVE) ×1
GLOVE BIOGEL PI INDICATOR 7.0 (GLOVE) ×2
GLOVE BIOGEL PI INDICATOR 8 (GLOVE) ×1
GLOVE SURG SS PI 8.5 STRL IVOR (GLOVE) ×1
GLOVE SURG SS PI 8.5 STRL STRW (GLOVE) IMPLANT
GOWN STRL REUS W/ TWL LRG LVL3 (GOWN DISPOSABLE) ×2 IMPLANT
GOWN STRL REUS W/ TWL XL LVL3 (GOWN DISPOSABLE) ×1 IMPLANT
GOWN STRL REUS W/TWL LRG LVL3 (GOWN DISPOSABLE) ×4
GOWN STRL REUS W/TWL XL LVL3 (GOWN DISPOSABLE) ×2
KIT BASIN OR (CUSTOM PROCEDURE TRAY) ×2 IMPLANT
KIT ROOM TURNOVER OR (KITS) ×2 IMPLANT
MANIFOLD NEPTUNE II (INSTRUMENTS) ×2 IMPLANT
NDL 18GX1X1/2 (RX/OR ONLY) (NEEDLE) ×1 IMPLANT
NDL SAFETY ECLIPSE 18X1.5 (NEEDLE) ×1 IMPLANT
NEEDLE 18GX1X1/2 (RX/OR ONLY) (NEEDLE) ×2 IMPLANT
NEEDLE HYPO 18GX1.5 SHARP (NEEDLE) ×2
NS IRRIG 1000ML POUR BTL (IV SOLUTION) ×2 IMPLANT
PACK TOTAL JOINT (CUSTOM PROCEDURE TRAY) ×2 IMPLANT
PACK UNIVERSAL I (CUSTOM PROCEDURE TRAY) ×2 IMPLANT
PAD ARMBOARD 7.5X6 YLW CONV (MISCELLANEOUS) ×2 IMPLANT
SUT MNCRL AB 4-0 PS2 18 (SUTURE) ×2 IMPLANT
SUT MON AB 2-0 CT1 36 (SUTURE) ×2 IMPLANT
SUT VIC AB 0 CT1 27 (SUTURE) ×2
SUT VIC AB 0 CT1 27XBRD ANBCTR (SUTURE) ×1 IMPLANT
SUT VIC AB 1 CT1 27 (SUTURE) ×2
SUT VIC AB 1 CT1 27XBRD ANBCTR (SUTURE) ×1 IMPLANT
SYR 50ML LL SCALE MARK (SYRINGE) ×2 IMPLANT
TOWEL OR 17X24 6PK STRL BLUE (TOWEL DISPOSABLE) ×2 IMPLANT
TOWEL OR 17X26 10 PK STRL BLUE (TOWEL DISPOSABLE) ×2 IMPLANT
TOWEL OR NON WOVEN STRL DISP B (DISPOSABLE) ×2 IMPLANT
WATER STERILE IRR 1000ML POUR (IV SOLUTION) ×2 IMPLANT

## 2015-03-20 NOTE — Anesthesia Procedure Notes (Signed)
Procedure Name: Intubation Date/Time: 03/20/2015 7:39 AM Performed by: Manus Gunning, Oneta Sigman J Pre-anesthesia Checklist: Patient identified, Timeout performed, Emergency Drugs available, Suction available and Patient being monitored Patient Re-evaluated:Patient Re-evaluated prior to inductionOxygen Delivery Method: Circle system utilized Preoxygenation: Pre-oxygenation with 100% oxygen Intubation Type: IV induction Ventilation: Mask ventilation without difficulty Laryngoscope Size: Mac and 4 Grade View: Grade II Tube type: Oral Tube size: 8.0 mm Number of attempts: 1 Placement Confirmation: ETT inserted through vocal cords under direct vision,  breath sounds checked- equal and bilateral and positive ETCO2 Secured at: 23 cm Tube secured with: Tape Dental Injury: Teeth and Oropharynx as per pre-operative assessment

## 2015-03-20 NOTE — Progress Notes (Signed)
OT Cancellation Note  Patient Details Name: Raymond Barrera MRN: [id number] DOB: [birth year]   Cancelled Treatment:    Reason Eval/Treat Not Completed: Fatigue/lethargy limiting ability to participate    Benito Mccreedy OTR/L [phone] 03/20/2015, 4:42 PM

## 2015-03-20 NOTE — H&P (View-Only) (Signed)
TOTAL HIP ADMISSION H&P  Patient is admitted for right total hip arthroplasty.  Subjective:  Chief Complaint: right hip pain  HPI: Raymond Barrera, 54 y.o. male, has a history of pain and functional disability in the right hip(s) due to arthritis and patient has failed non-surgical conservative treatments for greater than 12 weeks to include NSAID's and/or analgesics, corticosteriod injections, supervised PT with diminished ADL's post treatment, use of assistive devices and activity modification.  Onset of symptoms was gradual starting 2 years ago with gradually worsening course since that time.The patient noted no past surgery on the right hip(s).  Patient currently rates pain in the right hip at 10 out of 10 with activity. Patient has night pain, worsening of pain with activity and weight bearing, pain that interfers with activities of daily living and pain with passive range of motion. Patient has evidence of joint space narrowing by imaging studies. This condition presents safety issues increasing the risk of falls.  There is no current active infection.  Patient Active Problem List   Diagnosis Date Noted  . Lumbar spondylosis 10/17/2014  . Lumbago 07/25/2014  . Abnormality of gait 07/25/2014  . Difficulty in walking(719.7) 07/25/2014  . TIA (transient ischemic attack) 03/09/2013  . Cocaine abuse 03/09/2013  . Accelerated hypertension 03/09/2013  . Other and unspecified hyperlipidemia 03/09/2013  . Current smoker 03/09/2013   Past Medical History  Diagnosis Date  . High cholesterol     takes Zocor daily  . Stroke 08/2013  . Arthritis   . Headache     migraines  . Anxiety     takes Valium daily as needed  . Diabetes mellitus without complication     takes Metformin daily  . Hypertension     takes Benazepril and HCTZ  daily  . PONV (postoperative nausea and vomiting)   . Myocardial infarction 1987  . TIA (transient ischemic attack) 2014    x 7   . Pneumonia     hx of-80's   . Joint pain   . Joint swelling   . Back pain     hx of buldging disc    Past Surgical History  Procedure Laterality Date  . Ankle surgery  2008    left ankle-otif-[facility]  . Mass excision  09/13/2012    Procedure: EXCISION MASS;  Surgeon: Jamesetta So, MD;  Location: [facility];  Service: General;  Laterality: N/A;  . Tonsillectomy    . Lumbar laminectomy/decompression microdiscectomy Right 10/17/2014    Procedure: LUMBAR LAMINECTOMY/DECOMPRESSION MICRODISCECTOMY 2 LEVELS;  Surgeon: [name], MD;  Location: [facility];  Service: Neurosurgery;  Laterality: Right;  Right L45 L5S1 laminectomy and foraminotomy    No prescriptions prior to admission   Allergies  Allergen Reactions  . Poison Ivy Extract [Extract Of Poison Ivy] Itching and Rash    History  Substance Use Topics  . Smoking status: Current Every Day Smoker -- 0.75 packs/day for 47 years    Types: Cigarettes  . Smokeless tobacco: Never Used  . Alcohol Use: No     Comment: no alcohol in 2 yrs    No family history on file.   Review of Systems  Constitutional: Negative for fever and chills.  HENT: Negative for ear pain.   Eyes: Negative for blurred vision and double vision.  Respiratory: Negative for cough and wheezing.   Cardiovascular: Negative for chest pain and palpitations.  Gastrointestinal: Negative for nausea and vomiting.  Musculoskeletal:       Right hip  pain and decreased ROM  Neurological: Negative for dizziness, seizures and headaches.  Psychiatric/Behavioral: Negative for depression and suicidal ideas.    Objective:  Physical Exam  Vitals reviewed. Constitutional: He is oriented to person, place, and time. He appears well-developed and well-nourished.  HENT:  Head: Normocephalic and atraumatic.  Eyes: Conjunctivae and EOM are normal. Pupils are equal, round, and reactive to light.  Neck: Normal range of motion. Neck supple.  Cardiovascular: Normal rate and intact distal pulses.    Respiratory: Effort normal and breath sounds normal.  GI: Soft. Bowel sounds are normal.  Musculoskeletal: He exhibits tenderness (right hip).  Pain with internal and external rotation of the right hip  Neurological: He is alert and oriented to person, place, and time.  Skin: Skin is warm and dry.  Psychiatric: He has a normal mood and affect. His behavior is normal. Judgment and thought content normal.    Vital signs in last 24 hours:    Labs:   Estimated body mass index is 30.83 kg/(m^2) as calculated from the following:   Height as of 12/21/14: 6\' 6"  (1.981 m).   Weight as of 12/21/14: 121 kg (266 lb 12.1 oz).   Imaging Review Plain radiographs demonstrate severe degenerative joint disease of the right hip(s). The bone quality appears to be fair for age and reported activity level.  Assessment/Plan:  End stage arthritis, right hip(s)  The patient history, physical examination, clinical judgement of the provider and imaging studies are consistent with end stage degenerative joint disease of the right hip(s) and total hip arthroplasty is deemed medically necessary. The treatment options including medical management, injection therapy, arthroscopy and arthroplasty were discussed at length. The risks and benefits of total hip arthroplasty were presented and reviewed. The risks due to aseptic loosening, infection, stiffness, dislocation/subluxation,  thromboembolic complications and other imponderables were discussed.  The patient acknowledged the explanation, agreed to proceed with the plan and consent was signed. Patient is being admitted for inpatient treatment for surgery, pain control, PT, OT, prophylactic antibiotics, VTE prophylaxis, progressive ambulation and ADL's and discharge planning.The patient is planning to be discharged home with home health services

## 2015-03-20 NOTE — Discharge Instructions (Signed)
INSTRUCTIONS AFTER JOINT REPLACEMENT   o Remove items at home which could result in a fall. This includes throw rugs or furniture in walking pathways o ICE to the affected joint every three hours while awake for 30 minutes at a time, for at least the first 3-5 days, and then as needed for pain and swelling.  Continue to use ice for pain and swelling. You may notice swelling that will progress down to the foot and ankle.  This is normal after surgery.  Elevate your leg when you are not up walking on it.   o Continue to use the breathing machine you got in the hospital (incentive spirometer) which will help keep your temperature down.  It is common for your temperature to cycle up and down following surgery, especially at night when you are not up moving around and exerting yourself.  The breathing machine keeps your lungs expanded and your temperature down.   DIET:  As you were doing prior to hospitalization, we recommend a well-balanced diet.  DRESSING / WOUND CARE / SHOWERING  Keep the surgical dressing until follow up.  IF THE DRESSING FALLS OFF or the wound gets wet inside, change the dressing with sterile gauze.  Please use good hand washing techniques before changing the dressing.  Do not use any lotions or creams on the incision until instructed by your surgeon.    ACTIVITY  o Increase activity slowly as tolerated, but follow the weight bearing instructions below.   o No driving for 6 weeks or until further direction given by your physician.  You cannot drive while taking narcotics.  o No lifting or carrying greater than 10 lbs. until further directed by your surgeon. o Avoid periods of inactivity such as sitting longer than an hour when not asleep. This helps prevent blood clots.  o You may return to work once you are authorized by your doctor.     WEIGHT BEARING   Weight bearing as tolerated with assist device (walker, cane, etc) as directed, use it as long as suggested by your  surgeon or therapist, typically at least 4-6 weeks.   EXERCISES  Results after joint replacement surgery are often greatly improved when you follow the exercise, range of motion and muscle strengthening exercises prescribed by your doctor. Safety measures are also important to protect the joint from further injury. Any time any of these exercises cause you to have increased pain or swelling, decrease what you are doing until you are comfortable again and then slowly increase them. If you have problems or questions, call your caregiver or physical therapist for advice.   Rehabilitation is important following a joint replacement. After just a few days of immobilization, the muscles of the leg can become weakened and shrink (atrophy).  These exercises are designed to build up the tone and strength of the thigh and leg muscles and to improve motion. Often times heat used for twenty to thirty minutes before working out will loosen up your tissues and help with improving the range of motion but do not use heat for the first two weeks following surgery (sometimes heat can increase post-operative swelling).   A rehabilitation program following joint replacement surgery can speed recovery and prevent re-injury in the future due to weakened muscles. Contact your doctor or a physical therapist for more information on knee rehabilitation.    CONSTIPATION  Constipation is defined medically as fewer than three stools per week and severe constipation as less than one stool per  week.  Even if you have a regular bowel pattern at home, your normal regimen is likely to be disrupted due to multiple reasons following surgery.  Combination of anesthesia, postoperative narcotics, change in appetite and fluid intake all can affect your bowels.   YOU MUST use at least one of the following options; they are listed in order of increasing strength to get the job done.  They are all available over the counter, and you may need  to use some, POSSIBLY even all of these options:    Drink plenty of fluids (prune juice may be helpful) and high fiber foods Colace 100 mg by mouth twice a day  Senokot for constipation as directed and as needed Dulcolax (bisacodyl), take with full glass of water  Miralax (polyethylene glycol) once or twice a day as needed.  If you have tried all these things and are unable to have a bowel movement in the first 3-4 days after surgery call either your surgeon or your primary doctor.    If you experience loose stools or diarrhea, hold the medications until you stool forms back up.  If your symptoms do not get better within 1 week or if they get worse, check with your doctor.  If you experience "the worst abdominal pain ever" or develop nausea or vomiting, please contact the office immediately for further recommendations for treatment.   ITCHING:  If you experience itching with your medications, try taking only a single pain pill, or even half a pain pill at a time.  You can also use Benadryl over the counter for itching or also to help with sleep.   TED HOSE STOCKINGS:  Use stockings on both legs until for at least 2 weeks or as directed by physician office. They may be removed at night for sleeping.  MEDICATIONS:  See your medication summary on the After Visit Summary that nursing will review with you.  You may have some home medications which will be placed on hold until you complete the course of blood thinner medication.  It is important for you to complete the blood thinner medication as prescribed.  PRECAUTIONS:  If you experience chest pain or shortness of breath - call 911 immediately for transfer to the hospital emergency department.   If you develop a fever greater that 101 F, purulent drainage from wound, increased redness or drainage from wound, foul odor from the wound/dressing, or calf pain - CONTACT YOUR SURGEON.                                                   FOLLOW-UP  APPOINTMENTS:  If you do not already have a post-op appointment, please call the office for an appointment to be seen by your surgeon.  Guidelines for how soon to be seen are listed in your After Visit Summary, but are typically between 1-4 weeks after surgery.  OTHER INSTRUCTIONS:   Knee Replacement:  Do not place pillow under knee, focus on keeping the knee straight while resting. CPM instructions: 0-90 degrees, 2 hours in the morning, 2 hours in the afternoon, and 2 hours in the evening. Place foam block, curve side up under heel at all times except when in CPM or when walking.  DO NOT modify, tear, cut, or change the foam block in any way.  MAKE SURE YOU:  Understand these instructions.   Get help right away if you are not doing well or get worse.    Thank you for letting us be a part of your medical care team.  It is a privilege we respect greatly.  We hope these instructions will help you stay on track for a fast and full recovery!

## 2015-03-20 NOTE — Progress Notes (Signed)
Utilization review completed.  

## 2015-03-20 NOTE — Interval H&P Note (Signed)
History and Physical Interval Note:  03/20/2015 7:18 AM  Raymond Barrera  has presented today for surgery, with the diagnosis of djd right hip  The various methods of treatment have been discussed with the patient and family. After consideration of risks, benefits and other options for treatment, the patient has consented to  Procedure(s): TOTAL HIP ARTHROPLASTY ANTERIOR APPROACH (Right) as a surgical intervention .  The patient's history has been reviewed, patient examined, no change in status, stable for surgery.  I have reviewed the patient's chart and labs.  Questions were answered to the patient's satisfaction.     MURPHY, TIMOTHY D

## 2015-03-20 NOTE — Transfer of Care (Signed)
Immediate Anesthesia Transfer of Care Note  Patient: Raymond Barrera  Procedure(s) Performed: Procedure(s): TOTAL HIP ARTHROPLASTY ANTERIOR APPROACH (Right)  Patient Location: PACU  Anesthesia Type:General  Level of Consciousness: awake  Airway & Oxygen Therapy: Patient Spontanous Breathing and Patient connected to nasal cannula oxygen  Post-op Assessment: Report given to RN and Post -op Vital signs reviewed and stable  Post vital signs: Reviewed and stable  Last Vitals:  Filed Vitals:   03/20/15 0652  BP: 99/77  Pulse: 72  Temp: 36.3 C  Resp: 20    Complications: No apparent anesthesia complications

## 2015-03-20 NOTE — Plan of Care (Signed)
Problem: Consults Goal: Diagnosis- Total Joint Replacement Primary Total Hip right

## 2015-03-20 NOTE — Op Note (Signed)
03/20/2015  9:07 AM  PATIENT:  Raymond Barrera   MRN: [id number]  PRE-OPERATIVE DIAGNOSIS:  djd right hip  POST-OPERATIVE DIAGNOSIS:  djd right hip  PROCEDURE:  Procedure(s): TOTAL HIP ARTHROPLASTY ANTERIOR APPROACH  PREOPERATIVE INDICATIONS:    Raymond Barrera is an 54 y.o. male who has a diagnosis of <principal problem not specified> and elected for surgical management after failing conservative treatment.  The risks benefits and alternatives were discussed with the patient including but not limited to the risks of nonoperative treatment, versus surgical intervention including infection, bleeding, nerve injury, periprosthetic fracture, the need for revision surgery, dislocation, leg length discrepancy, blood clots, cardiopulmonary complications, morbidity, mortality, among others, and they were willing to proceed.     OPERATIVE REPORT     SURGEON:   Lealer Marsland, Ernesta Amble, MD    ASSISTANT:  Lovett Calender, PA-C, She was present and scrubbed throughout the case, critical for completion in a timely fashion, and for retraction, instrumentation, and closure.     ANESTHESIA:  General    COMPLICATIONS:  None.     COMPONENTS:  Stryker acolade fit femur size 7 with a 36 mm +0 head ball and a PSL acetabular shell size 56 with a  polyethylene liner    PROCEDURE IN DETAIL:   The patient was met in the holding area and  identified.  The appropriate hip was identified and marked at the operative site.  The patient was then transported to the OR  and  placed under general anesthesia.  At that point, the patient was  placed in the supine position and  secured to the operating room table and all bony prominences padded. He received pre-operative antibiotics    The operative lower extremity was prepped from the iliac crest to the distal leg.  Sterile draping was performed.  Time out was performed prior to incision.      Skin incision was made just 2 cm lateral to the ASIS  extending in line with the  tensor fascia lata. Electrocautery was used to control all bleeders. I dissected down sharply to the fascia of the tensor fascia lata was confirmed that the muscle fibers beneath were running posteriorly. I then incised the fascia over the superficial tensor fascia lata in line with the incision. The fascia was elevated off the anterior aspect of the muscle the muscle was retracted posteriorly and protected throughout the case. I then used electrocautery to incise the tensor fascia lata fascia control and all bleeders. Immediately visible was the fat over top of the anterior neck and capsule.  I removed the anterior fat from the capsule and elevated the rectus muscle off of the anterior capsule. I then removed a large time of capsule. The retractors were then placed over the anterior acetabulum as well as around the superior and inferior neck.  I then removed a section of the femoral neck and a napkin ring fashion. Then used the power course to remove the femoral head from the acetabulum and thoroughly irrigated the acetabulum. I sized the femoral head.    I then exposed the deep acetabulum, cleared out any tissue including the ligamentum teres.   After adequate visualization, I excised the labrum, and then sequentially reamed.  I placed the trial acetabulum, which seated nicely, and then impacted the real cup into place.  Appropriate version and inclination was confirmed clinically matching their bony anatomy, and also with the use transverse acetabular ligament.  I placed a 66mm screw in the posterior/superio position  with an excellent bite.    I then placed the polyethylene liner in place  I then abducted the leg and released the external rotators from the posterior femur allowing it to be easily delivered up lateral and anterior to the acetabulum for preparation of the femoral canal.    I then prepared the proximal femur using the cookie-cutter and then sequentially reamed and broached.  A trial  broach, neck, and head was utilized, and I reduced the hip and it was found to have excellent stability with functional range of motion..  I then impacted the real femoral prosthesis into place into the appropriate version, slightly anteverted to the normal anatomy, and I impacted the real head ball into place. The hip was then reduced and taken through functional range of motion and found to have excellent stability. Leg lengths were restored.  I then irrigated the hip copiously again with, and repaired the fascia with Vicryl, followed by monocryl for the subcutaneous tissue, Monocryl for the skin, Steri-Strips and sterile gauze. The wounds were injected. The patient was then awakened and returned to PACU in stable and satisfactory condition. There were no complications.  POST OPERATIVE PLAN: WBAT, DVT px: SCD's/TED and ASA 325  Edmonia Lynch, MD Orthopedic Surgeon [phone]   This note was generated using a template and dragon dictation system. In light of that, I have reviewed the note and all aspects of it are applicable to this case. Any dictation errors are due to the computerized dictation system.

## 2015-03-20 NOTE — Anesthesia Preprocedure Evaluation (Signed)
Anesthesia Evaluation  Patient identified by MRN, date of birth, ID band Patient awake    Reviewed: Allergy & Precautions, NPO status , Patient's Chart, lab work & pertinent test results  History of Anesthesia Complications (+) PONV  Airway Mallampati: II   Neck ROM: full    Dental   Pulmonary Current Smoker,  breath sounds clear to auscultation        Cardiovascular hypertension, + Past MI Rhythm:regular Rate:Normal     Neuro/Psych  Headaches, Anxiety CVA    GI/Hepatic   Endo/Other  diabetes, Type 2obese  Renal/GU      Musculoskeletal  (+) Arthritis -,   Abdominal   Peds  Hematology   Anesthesia Other Findings   Reproductive/Obstetrics                             Anesthesia Physical Anesthesia Plan  ASA: III  Anesthesia Plan: General   Post-op Pain Management:    Induction: Intravenous  Airway Management Planned: Oral ETT  Additional Equipment:   Intra-op Plan:   Post-operative Plan: Extubation in OR  Informed Consent: I have reviewed the patients History and Physical, chart, labs and discussed the procedure including the risks, benefits and alternatives for the proposed anesthesia with the patient or authorized representative who has indicated his/her understanding and acceptance.     Plan Discussed with: CRNA, Anesthesiologist and Surgeon  Anesthesia Plan Comments:         Anesthesia Quick Evaluation

## 2015-03-20 NOTE — Anesthesia Postprocedure Evaluation (Signed)
Anesthesia Post Note  Patient: Raymond Barrera  Procedure(s) Performed: Procedure(s) (LRB): TOTAL HIP ARTHROPLASTY ANTERIOR APPROACH (Right)  Anesthesia type: General  Patient location: PACU  Post pain: Pain level controlled and Adequate analgesia  Post assessment: Post-op Vital signs reviewed, Patient's Cardiovascular Status Stable, Respiratory Function Stable, Patent Airway and Pain level controlled  Last Vitals:  Filed Vitals:   03/20/15 1258  BP: 104/70  Pulse: 75  Temp: 36.1 C  Resp: 16    Post vital signs: Reviewed and stable  Level of consciousness: awake, alert  and oriented  Complications: No apparent anesthesia complications

## 2015-03-20 NOTE — Evaluation (Signed)
Physical Therapy Evaluation Patient Details Name: Raymond Barrera MRN: [id number] DOB: [birth year] Today's Date: 03/20/2015   History of Present Illness  Pt is a 54 y/o M s/p R THA, direct anterior approach. [name] PMH includes lumbar spondylosis and lumbago w/ lumbar laminectomy/decompression microdiscectomy in 2015, TIA x7, cocaine abuse, HTN, current smoker, stroke, anxiety, DM, PONV, MI (1987), and L ankle surgery.  Clinical Impression  Pt is s/p R THA resulting in the deficits listed below (see PT Problem List). Pt was extremely lethargic this session and had difficulty keeping his eyes open throughout session 2/2 lethargic state.  Pt performed shuffle/step to recliner this session and is anticipated to show great progress once no longer lethargic.  Pt will benefit from skilled PT to increase their independence and safety with mobility to allow discharge to the venue listed below.      Follow Up Recommendations Outpatient PT;Supervision for mobility/OOB    Equipment Recommendations  Rolling walker with 5" wheels;3in1 (PT)    Recommendations for Other Services       Precautions / Restrictions Precautions Precautions: Fall Restrictions Weight Bearing Restrictions: Yes RLE Weight Bearing: Weight bearing as tolerated      Mobility  Bed Mobility Overal bed mobility: Needs Assistance Bed Mobility: Supine to Sit     Supine to sit: Min assist;HOB elevated     General bed mobility comments: Min A bringing BLEs to EOB.  Placement of LUE on rail to pull up to sitting.  Use of bed pad to straighten pt's hips sitting EOB.  Transfers Overall transfer level: Needs assistance Equipment used: Rolling walker (2 wheeled) Transfers: Sit to/from Stand Sit to Stand: Min assist         General transfer comment: min A for rising to stand upright.  Verbal cues for hand placement.  Ambulation/Gait Ambulation/Gait assistance: Min guard Ambulation Distance (Feet): 3 Feet Assistive device:  Rolling walker (2 wheeled) Gait Pattern/deviations: Step-to pattern;Decreased stance time - right;Decreased stride length;Antalgic;Trunk flexed;Shuffle   Gait velocity interpretation: Below normal speed for age/gender General Gait Details: shuffle to recliner w/ verbal cues for sequencing and assist w/ navigating RW.    Stairs            Wheelchair Mobility    Modified Rankin (Stroke Patients Only)       Balance Overall balance assessment: Needs assistance Sitting-balance support: Bilateral upper extremity supported;Feet supported Sitting balance-Leahy Scale: Fair   Postural control: Posterior lean Standing balance support: Bilateral upper extremity supported;During functional activity Standing balance-Leahy Scale: Fair                               Pertinent Vitals/Pain Pain Assessment: 0-10 Pain Score: 10-Worst pain ever Pain Location: R hip Pain Descriptors / Indicators: Grimacing;Guarding;Moaning Pain Intervention(s): Limited activity within patient's tolerance;Monitored during session;Repositioned    Home Living Family/patient expects to be discharged to:: Private residence Living Arrangements: Spouse/significant other Available Help at Discharge: Family;Available 24 hours/day (wife) Type of Home: House Home Access: Level entry     Home Layout: One level Home Equipment: Cane - single point;Walker - 2 wheels      Prior Function Level of Independence: Independent with assistive device(s) (cane at all times except RW w/ 2 wheels when in severe pain)               Hand Dominance   Dominant Hand: Right    Extremity/Trunk Assessment  Lower Extremity Assessment: RLE deficits/detail (difficult to assess fully 2/2 lethargic state) RLE Deficits / Details: as expected s/p R THA    Cervical / Trunk Assessment: Normal  Communication   Communication: No difficulties  Cognition Arousal/Alertness: Lethargic Behavior During  Therapy: Flat affect Overall Cognitive Status: Difficult to assess                      General Comments General comments (skin integrity, edema, etc.): Pt w/ contant verbal and tactile cues to keep eyes open which was difficult for pt 2/2 lethargic state.  Pt w/ severe diaphoresis which pt's wife says is his baseline.  Pt w/ nausea w/o production.    Exercises Total Joint Exercises Ankle Circles/Pumps: PROM;Both;5 reps;Supine;AAROM Heel Slides: AAROM;Right;10 reps;Supine Hip ABduction/ADduction: AAROM;Right;10 reps;Supine      Assessment/Plan    PT Assessment Patient needs continued PT services  PT Diagnosis Difficulty walking;Abnormality of gait;Generalized weakness;Acute pain   PT Problem List Decreased strength;Decreased range of motion;Decreased activity tolerance;Decreased balance;Decreased mobility;Decreased coordination;Decreased knowledge of use of DME;Decreased safety awareness;Decreased knowledge of precautions;Pain  PT Treatment Interventions DME instruction;Gait training;Stair training;Functional mobility training;Therapeutic activities;Therapeutic exercise;Balance training;Neuromuscular re-education;Patient/family education;Modalities   PT Goals (Current goals can be found in the Care Plan section) Acute Rehab PT Goals Patient Stated Goal: none stated PT Goal Formulation: With patient/family Time For Goal Achievement: 03/27/15 Potential to Achieve Goals: Good    Frequency 7X/week   Barriers to discharge        Co-evaluation               End of Session   Activity Tolerance: Patient limited by lethargy;Patient limited by pain Patient left: in chair;with call bell/phone within reach;with family/visitor present Nurse Communication: Mobility status;Precautions;Weight bearing status (lethargic state)         Time: 8675-4492 PT Time Calculation (min) (ACUTE ONLY): 36 min   Charges:   PT Evaluation $Initial PT Evaluation Tier I: 1 Procedure PT  Treatments $Therapeutic Exercise: 8-22 mins   PT G Codes:       Joslyn Hy PT, DPT [phone] Pager: [phone]   03/20/2015, 1:41 PM

## 2015-03-21 ENCOUNTER — Encounter (HOSPITAL_COMMUNITY): Payer: Self-pay | Admitting: General Practice

## 2015-03-21 LAB — GLUCOSE, CAPILLARY: GLUCOSE-CAPILLARY: 138 mg/dL — AB (ref 70–99)

## 2015-03-21 NOTE — Progress Notes (Signed)
Physical Therapy Treatment Patient Details Name: Raymond Barrera MRN: [id number] DOB: 05/29/1961 Today's Date: 03/21/2015    History of Present Illness Pt is a 54 y/o M s/p R THA, direct anterior approach. [name] PMH includes lumbar spondylosis and lumbago w/ lumbar laminectomy/decompression microdiscectomy in 2015, TIA x7, cocaine abuse, HTN, current smoker, stroke, anxiety, DM, PONV, MI (1987), and L ankle surgery.    PT Comments    Pt is making progress towards goals, but is still limited due to pain. Pt is motivated with therapy and states that he is ready to go home. [name] is very good with encouraging pt's independence and able to provide support at home. Pt safe to D/C from a mobility standpoint based on progression towards goals set on PT eval.  Follow Up Recommendations  Outpatient PT;Supervision for mobility/OOB     Equipment Recommendations  Rolling walker with 5" wheels;3in1 (PT)    Recommendations for Other Services       Precautions / Restrictions Precautions Precautions: Fall Restrictions Weight Bearing Restrictions: Yes RLE Weight Bearing: Weight bearing as tolerated    Mobility  Bed Mobility Overal bed mobility: Needs Assistance Bed Mobility: Supine to Sit;Sit to Supine     Supine to sit: Supervision;HOB elevated Sit to supine: Min assist   General bed mobility comments: Heavy use of rails, but able to do supine to sit with supervision for safety. Min A to guide RLE back onto bed for sit to supine. Wife able to assist.  Transfers Overall transfer level: Needs assistance Equipment used: Rolling walker (2 wheeled) Transfers: Sit to/from Stand Sit to Stand: Supervision         General transfer comment: Supervision for safety.  Ambulation/Gait Ambulation/Gait assistance: Min guard Ambulation Distance (Feet): 120 Feet Assistive device: Rolling walker (2 wheeled) Gait Pattern/deviations: Step-through pattern;Decreased stance time - right;Decreased  stride length;Antalgic;Trunk flexed   Gait velocity interpretation: Below normal speed for age/gender General Gait Details: Cues for upright posture and to keep RW moving. Pt stated that he wasn't having any pain, but rated pain as a 9/10 during ambulation.   Stairs            Wheelchair Mobility    Modified Rankin (Stroke Patients Only)       Balance Overall balance assessment: Needs assistance Sitting-balance support: Bilateral upper extremity supported;Feet supported Sitting balance-Leahy Scale: Fair     Standing balance support: Bilateral upper extremity supported;During functional activity Standing balance-Leahy Scale: Fair                      Cognition Arousal/Alertness: Awake/alert Behavior During Therapy: WFL for tasks assessed/performed Overall Cognitive Status: Within Functional Limits for tasks assessed                      Exercises Total Joint Exercises Quad Sets: AROM;Right;10 reps Heel Slides: AROM;Right;10 reps Hip ABduction/ADduction: AAROM;Right;10 reps Long Arc Quad: AROM;Right;10 reps    General Comments        Pertinent Vitals/Pain Pain Assessment: 0-10 Pain Score: 9  Pain Location: R hip Pain Descriptors / Indicators: Sore;Grimacing;Moaning Pain Intervention(s): Monitored during session;Limited activity within patient's tolerance    Home Living Family/patient expects to be discharged to:: Private residence Living Arrangements: Spouse/significant other Available Help at Discharge: Family;Available 24 hours/day Type of Home: House Home Access: Level entry   Home Layout: One level Home Equipment: Cane - single point;Walker - 2 wheels;Tub bench;Bedside commode;Adaptive equipment;Hand held shower head  Prior Function Level of Independence: Independent with assistive device(s)      Comments: cane   PT Goals (current goals can now be found in the care plan section) Acute Rehab PT Goals Patient Stated Goal: none  stated Progress towards PT goals: Progressing toward goals    Frequency  7X/week    PT Plan Current plan remains appropriate    Co-evaluation             End of Session Equipment Utilized During Treatment: Gait belt Activity Tolerance: Patient limited by pain Patient left: in bed;with call bell/phone within reach;with family/visitor present     Time: 2836-6294 PT Time Calculation (min) (ACUTE ONLY): 15 min  Charges:  $Gait Training: 8-22 mins $Therapeutic Activity: 8-22 mins                    G CodesRubye Oaks, [place] 03/21/2015, 12:18 PM

## 2015-03-21 NOTE — Evaluation (Signed)
Occupational Therapy Evaluation Patient Details Name: Raymond Barrera MRN: [id number] DOB: Sep 11, 1961 Today's Date: 03/21/2015    History of Present Illness Pt is a 54 y/o M s/p R THA, direct anterior approach. [name] PMH includes lumbar spondylosis and lumbago w/ lumbar laminectomy/decompression microdiscectomy in 2015, TIA x7, cocaine abuse, HTN, current smoker, stroke, anxiety, DM, PONV, MI (1987), and L ankle surgery.   Clinical Impression   Pt admitted with the above diagnoses and presents with below problem list. Pt will benefit from continued acute OT to address the below listed deficits and maximize independence with basic ADLs prior to d/c home with wife providing 24 hour assist. PTA pt was mod I with ADLs. Pt currently at min guard to min A for LB ADLs, transfers, and functional mobility. Limited this session by 9/10 pain. ADL education provided and ADLs completed as detailed below.      Follow Up Recommendations  Supervision/Assistance - 24 hour;No OT follow up    Equipment Recommendations  None recommended by OT    Recommendations for Other Services       Precautions / Restrictions Precautions Precautions: Fall Restrictions Weight Bearing Restrictions: Yes RLE Weight Bearing: Weight bearing as tolerated      Mobility Bed Mobility Overal bed mobility: Needs Assistance Bed Mobility: Supine to Sit     Supine to sit: Min guard;HOB elevated Sit to supine: Min assist   General bed mobility comments: close min guard. HOB elevated. Heavy use of rails. Able to advance BLE across bed without physical assist.  Transfers Overall transfer level: Needs assistance Equipment used: Rolling walker (2 wheeled) Transfers: Sit to/from Stand Sit to Stand: Min assist         General transfer comment: light min A for balance, 9/10 pain during session    Balance Overall balance assessment: Needs assistance Sitting-balance support: Bilateral upper extremity supported;Feet  supported Sitting balance-Leahy Scale: Fair     Standing balance support: Bilateral upper extremity supported;During functional activity Standing balance-Leahy Scale: Fair                              ADL Overall ADL's : Needs assistance/impaired Eating/Feeding: Set up;Sitting   Grooming: Set up;Sitting   Upper Body Bathing: Set up;Sitting   Lower Body Bathing: Minimal assistance;Sit to/from stand;With adaptive equipment   Upper Body Dressing : Set up;Sitting   Lower Body Dressing: Minimal assistance;Sit to/from stand;With adaptive equipment   Toilet Transfer: Min guard;Ambulation;RW (3n1 over toilet)   Toileting- Clothing Manipulation and Hygiene: Minimal assistance;Sit to/from stand   Tub/ Shower Transfer: Minimal assistance;Ambulation;Tub bench;Rolling walker   Functional mobility during ADLs: Min guard;Rolling walker General ADL Comments: Pt ambulated household distance at min guard level using a rw. Pt at min A level for LB ADLs and transfers. Education provided to pt and spouse on techniques and AE for completion of ADLs during recovery. Discussed using pillows in recliner at the hospital and home to elevate seat surface to facilitate sit<>stand transfers.     Vision     Perception     Praxis      Pertinent Vitals/Pain Pain Assessment: 0-10 Pain Score: 9  Pain Location: Rt hip Pain Descriptors / Indicators: Grimacing;Guarding;Sore Pain Intervention(s): Limited activity within patient's tolerance;Monitored during session;Repositioned;Patient requesting pain meds-RN notified     Hand Dominance Right   Extremity/Trunk Assessment Upper Extremity Assessment Upper Extremity Assessment: Overall WFL for tasks assessed   Lower Extremity Assessment Lower Extremity Assessment:  Defer to PT evaluation       Communication Communication Communication: No difficulties   Cognition Arousal/Alertness: Awake/alert Behavior During Therapy: WFL for tasks  assessed/performed Overall Cognitive Status: Within Functional Limits for tasks assessed                     General Comments       Exercises       Shoulder Instructions      Home Living Family/patient expects to be discharged to:: Private residence Living Arrangements: Spouse/significant other Available Help at Discharge: Family;Available 24 hours/day Type of Home: House Home Access: Level entry     Home Layout: One level     Bathroom Shower/Tub: Teacher, early years/pre: Standard     Home Equipment: Cane - single point;Walker - 2 wheels;Tub bench;Bedside commode;Adaptive equipment;Hand held shower head          Prior Functioning/Environment Level of Independence: Independent with assistive device(s)        Comments: cane    OT Diagnosis: Acute pain   OT Problem List: Impaired balance (sitting and/or standing);Decreased knowledge of use of DME or AE;Decreased knowledge of precautions;Pain   OT Treatment/Interventions: Self-care/ADL training;DME and/or AE instruction;Energy conservation;Therapeutic activities;Patient/family education;Balance training    OT Goals(Current goals can be found in the care plan section) Acute Rehab OT Goals Patient Stated Goal: none stated OT Goal Formulation: With patient/family Time For Goal Achievement: 03/28/15 Potential to Achieve Goals: Good ADL Goals Pt Will Transfer to Toilet: with modified independence;ambulating (3n1 over toilet) Pt Will Perform Toileting - Clothing Manipulation and hygiene: with modified independence;with adaptive equipment;sit to/from stand Pt Will Perform Tub/Shower Transfer: with modified independence;ambulating;tub bench;rolling walker  OT Frequency: Min 2X/week   Barriers to D/C:            Co-evaluation              End of Session Equipment Utilized During Treatment: Gait belt;Rolling walker Nurse Communication: Patient requests pain meds;Other (comment) (Nurse  communicated with pt during session.)  Activity Tolerance: Patient tolerated treatment well;Patient limited by pain Patient left: in chair;with call bell/phone within reach;with family/visitor present   Time: 1010-1027 OT Time Calculation (min): 17 min Charges:  OT General Charges $OT Visit: 1 Procedure OT Evaluation $Initial OT Evaluation Tier I: 1 Procedure G-Codes:    Hortencia Pilar 03/26/15, 10:59 AM

## 2015-03-21 NOTE — Progress Notes (Signed)
03/21/15 Set up with [facility] for HHPT by MD office. Spoke with patient and wife, agreeable to HHPT with [facility]. Contacted Boe Deans at [facility] and confirmed [place].

## 2015-03-21 NOTE — Discharge Summary (Signed)
Physician Discharge Summary  Patient ID: Raymond Barrera MRN: [id number] DOB/AGE: 1961/09/11 54 y.o.  Admit date: 03/20/2015 Discharge date: 03/21/2015  Admission Diagnoses:  <principal problem not specified>  Discharge Diagnoses:  Active Problems:   DJD (degenerative joint disease)   Past Medical History  Diagnosis Date  . High cholesterol     takes Zocor daily  . Stroke 08/2013  . Arthritis   . Headache     migraines  . Anxiety     takes Valium daily as needed  . Diabetes mellitus without complication     takes Metformin daily  . Hypertension     takes Benazepril and HCTZ  daily  . Myocardial infarction 1987  . TIA (transient ischemic attack) 2014    x 7   . Pneumonia     hx of-80's  . Joint pain   . Joint swelling   . Back pain     hx of buldging disc  . PONV (postoperative nausea and vomiting)     took a while for him to wake up after previous anesthesia    Surgeries: Procedure(s): [place] on 03/20/2015   Consultants (if any):    Discharged Condition: Improved  Hospital Course: Raymond Barrera is an 54 y.o. male who was admitted 03/20/2015 with a diagnosis of <principal problem not specified> and went to the operating room on 03/20/2015 and underwent the above named procedures.    He was given perioperative antibiotics:  Anti-infectives    Start     Dose/Rate Route Frequency Ordered Stop   03/20/15 1330  ceFAZolin (ANCEF) IVPB 2 g/50 mL premix     2 g 100 mL/hr over 30 Minutes Intravenous Every 6 hours 03/20/15 1155 03/20/15 2135   03/20/15 0600  ceFAZolin (ANCEF) 3 g in dextrose 5 % 50 mL IVPB     3 g 160 mL/hr over 30 Minutes Intravenous On call to O.R. 03/19/15 1248 03/20/15 0728    .  He was given sequential compression devices, early ambulation, and ASA 325mg  for DVT prophylaxis.  He benefited maximally from the hospital stay and there were no complications.    Recent vital signs:  Filed Vitals:   03/21/15 1503   BP: 125/92  Pulse: 95  Temp: 98.1 F (36.7 C)  Resp: 18    Recent laboratory studies:  Lab Results  Component Value Date   HGB 15.1 03/08/2015   HGB 14.1 12/21/2014   HGB 12.7* 10/17/2014   Lab Results  Component Value Date   WBC 6.7 03/08/2015   PLT 207 03/08/2015   Lab Results  Component Value Date   INR 0.96 03/08/2015   Lab Results  Component Value Date   NA 139 03/08/2015   K 4.1 03/08/2015   CL 105 03/08/2015   CO2 24 03/08/2015   BUN 13 03/08/2015   CREATININE 1.18 03/08/2015   GLUCOSE 163* 03/08/2015    Discharge Medications:     Medication List    STOP taking these medications        acetaminophen 500 MG tablet  Commonly known as:  TYLENOL     aspirin EC 81 MG tablet  Replaced by:  aspirin 325 MG tablet     meloxicam 15 MG tablet  Commonly known as:  MOBIC      TAKE these medications        aspirin 325 MG tablet  Take 1 tablet (325 mg total) by mouth daily.     benazepril 10  MG tablet  Commonly known as:  LOTENSIN  Take 10 mg by mouth daily.     celecoxib 100 MG capsule  Commonly known as:  CELEBREX  Take 1 capsule (100 mg total) by mouth 2 (two) times daily.     diazepam 5 MG tablet  Commonly known as:  VALIUM  Take 1 tablet (5 mg total) by mouth every 6 (six) hours as needed for anxiety.     docusate sodium 100 MG capsule  Commonly known as:  COLACE  Take 1 capsule (100 mg total) by mouth 2 (two) times daily.     Fish Oil 1000 MG Caps  Take 1 capsule by mouth every morning.     folic acid 299 MCG tablet  Commonly known as:  FOLVITE  Take 400 mcg by mouth daily.     GLUCOSAMINE-CHONDROITIN PO  Take 1 tablet by mouth daily.     hydrochlorothiazide 25 MG tablet  Commonly known as:  HYDRODIURIL  Take 1 tablet (25 mg total) by mouth daily.     metFORMIN 500 MG tablet  Commonly known as:  GLUCOPHAGE  Take 500 mg by mouth 2 (two) times daily with a meal.     methocarbamol 500 MG tablet  Commonly known as:  ROBAXIN  Take  1 tablet (500 mg total) by mouth 4 (four) times daily.     ondansetron 4 MG tablet  Commonly known as:  ZOFRAN  Take 1 tablet (4 mg total) by mouth every 8 (eight) hours as needed for nausea or vomiting.     oxyCODONE-acetaminophen 10-325 MG per tablet  Commonly known as:  PERCOCET  Take 1 tablet by mouth every 4 (four) hours as needed for pain.     simvastatin 40 MG tablet  Commonly known as:  ZOCOR  Take 40 mg by mouth daily.        Diagnostic Studies: Dg Pelvis Portable  03/20/2015   CLINICAL DATA:  Status post right hip arthroplasty.  EXAM: PORTABLE PELVIS 1-2 VIEWS  COMPARISON:  10/30/2014.  FINDINGS: Interval right total hip prosthesis in satisfactory position and alignment. No fracture or dislocation seen.  IMPRESSION: Satisfactory postoperative appearance of a right total hip prosthesis.   Electronically Signed   By: Claudie Revering M.D.   On: 03/20/2015 11:10    Disposition: 01-Home or Self Care        Follow-up Information    Follow up with MURPHY, TIMOTHY D, MD In 1 week.   Specialty:  Orthopedic Surgery   Contact information:   [address] [place] [address] [phone]       Follow up with [facility].   Why:  Someone from [facility] will contact you concerning start date and time for phsyical therapy.   Contact information:   [address] [place] [address] [phone]        Signed: Gae Dry 03/21/2015, 3:43 PM Cell [phone]

## 2015-03-21 NOTE — Progress Notes (Signed)
     Subjective:  POD#1 RATH arthroplasty. Patient reports pain as mild to moderate.  Resting comfortably in bed.  Was up to a chair with PT yesterday.  Will plan to walk the halls and stairs today.  Objective:   VITALS:   Filed Vitals:   03/20/15 2342 03/21/15 0200 03/21/15 0327 03/21/15 0521  BP:  107/74  126/82  Pulse:  90  96  Temp:  98.5 F (36.9 C)  98.7 F (37.1 C)  TempSrc:      Resp: 16 18 18 18   Weight:      SpO2: 100% 98% 100% 100%    Neurologically intact ABD soft Neurovascular intact Sensation intact distally Intact pulses distally Dorsiflexion/Plantar flexion intact Incision: dressing C/D/I   Lab Results  Component Value Date   WBC 6.7 03/08/2015   HGB 15.1 03/08/2015   HCT 44.8 03/08/2015   MCV 87.8 03/08/2015   PLT 207 03/08/2015   BMET    Component Value Date/Time   NA 139 03/08/2015 1010   K 4.1 03/08/2015 1010   CL 105 03/08/2015 1010   CO2 24 03/08/2015 1010   GLUCOSE 163* 03/08/2015 1010   BUN 13 03/08/2015 1010   CREATININE 1.18 03/08/2015 1010   CALCIUM 10.2 03/08/2015 1010   GFRNONAA 67* 03/08/2015 1010   GFRAA 78* 03/08/2015 1010     Assessment/Plan: 1 Day Post-Op   Active Problems:   DJD (degenerative joint disease)   Up with therapy WBAT in the RLE ASA 325mg  daily for DVT prophylaxis for 30 days Plan to discharge home today or tomorrow depending on how things go with PT.   Gae Dry 03/21/2015, 7:40 AM Cell [phone]

## 2015-03-21 NOTE — Care Management Note (Signed)
CARE MANAGEMENT NOTE 03/21/2015  Patient:  Raymond Barrera, Raymond Barrera   Account Number:  [id number]  Date Initiated:  03/21/2015  Documentation initiated by:  Ricki Miller  Subjective/Objective Assessment:   54 year old male admitted with right hip DJD. Patient undrewent a right total hip arthroplasty.     Action/Plan:   Case manager spoke with patient and wife concerning home health and DME needs. Choice offered. Referral called to [place], [facility] Liaison. Patient has support at discharge.   Anticipated DC Date:  03/22/2015   Anticipated DC Plan:  [place]  CM consult      PAC Choice  [place]   Choice offered to / List presented to:  C-1 Patient   DME arranged  [facility]  3-N-1  [facility]      DME agency  [employer]     HH arranged  HH-2 PT      [place].   Status of service:  Completed, signed off Medicare Important Message given?   (If response is "NO", the following Medicare IM given date fields will be blank) Date Medicare IM given:   Medicare IM given by:   Date Additional Medicare IM given:   Additional Medicare IM given by:    Discharge Disposition:  [place]  Per UR Regulation:  Reviewed for med. necessity/level of care/duration of stay

## 2015-03-21 NOTE — Progress Notes (Signed)
Physical Therapy Treatment Patient Details Name: Raymond Barrera MRN: [id number] DOB: July 12, 1961 Today's Date: 03/21/2015    History of Present Illness Pt is a 54 y/o M s/p R THA, direct anterior approach. [name] PMH includes lumbar spondylosis and lumbago w/ lumbar laminectomy/decompression microdiscectomy in 2015, TIA x7, cocaine abuse, HTN, current smoker, stroke, anxiety, DM, PONV, MI (1987), and L ankle surgery.    PT Comments    Pt is making progress towards goals, but is still limited due to pain and nausea. Pt would benefit from continued PT to increase functional independence. Focus on progressive ambulation and TE as patient is able this afternoon. Continue with POC.  Follow Up Recommendations  Outpatient PT;Supervision for mobility/OOB     Equipment Recommendations  Rolling walker with 5" wheels;3in1 (PT)    Recommendations for Other Services       Precautions / Restrictions Precautions Precautions: Fall Restrictions RLE Weight Bearing: Weight bearing as tolerated    Mobility  Bed Mobility Overal bed mobility: Needs Assistance Bed Mobility: Supine to Sit;Sit to Supine     Supine to sit: Min assist Sit to supine: Min assist   General bed mobility comments: Min A for bringing BLEs to EOB. Heavy use of rails for supine to sit. Min A for BLEs back onto bed for sit to supine.  Transfers Overall transfer level: Needs assistance Equipment used: Rolling walker (2 wheeled) Transfers: Sit to/from Stand Sit to Stand: Min guard         General transfer comment: Min guard for safety. Cues for hand placement.  Ambulation/Gait Ambulation/Gait assistance: Min guard Ambulation Distance (Feet): 80 Feet Assistive device: Rolling walker (2 wheeled) Gait Pattern/deviations: Step-through pattern;Decreased stance time - right;Decreased stride length;Antalgic;Trunk flexed   Gait velocity interpretation: Below normal speed for age/gender General Gait Details: Cues for gait  sequencing and upright posture/forward head. Pt c/o nausea during ambulation.   Stairs            Wheelchair Mobility    Modified Rankin (Stroke Patients Only)       Balance                                    Cognition Arousal/Alertness: Awake/alert Behavior During Therapy: WFL for tasks assessed/performed Overall Cognitive Status: Within Functional Limits for tasks assessed                      Exercises      General Comments        Pertinent Vitals/Pain Pain Assessment: 0-10 Pain Score: 9  Pain Location: R hip Pain Descriptors / Indicators: Grimacing;Guarding;Sore Pain Intervention(s): Limited activity within patient's tolerance;Monitored during session    Home Living                      Prior Function            PT Goals (current goals can now be found in the care plan section) Progress towards PT goals: Progressing toward goals    Frequency  7X/week    PT Plan Current plan remains appropriate    Co-evaluation             End of Session Equipment Utilized During Treatment: Gait belt Activity Tolerance: Patient limited by pain;Patient limited by fatigue;Other (comment) (Pt limited by nausea.) Patient left: in bed;with call bell/phone within reach;with family/visitor present     Time: [phone]  PT Time Calculation (min) (ACUTE ONLY): 23 min  Charges:                       G CodesRubye Oaks, [place] 03/21/2015, 9:54 AM

## 2015-04-20 ENCOUNTER — Encounter (HOSPITAL_COMMUNITY): Payer: Self-pay | Admitting: *Deleted

## 2015-04-20 ENCOUNTER — Emergency Department (HOSPITAL_COMMUNITY): Payer: Medicaid Other

## 2015-04-20 ENCOUNTER — Emergency Department (HOSPITAL_COMMUNITY)
Admission: EM | Admit: 2015-04-20 | Discharge: 2015-04-20 | Disposition: A | Discharge status: Home / Self Care | Payer: Medicaid Other | Attending: Emergency Medicine | Admitting: Emergency Medicine

## 2015-04-20 DIAGNOSIS — Z8701 Personal history of pneumonia (recurrent): Secondary | ICD-10-CM | POA: Diagnosis not present

## 2015-04-20 DIAGNOSIS — Z7982 Long term (current) use of aspirin: Secondary | ICD-10-CM | POA: Diagnosis not present

## 2015-04-20 DIAGNOSIS — Z720 Tobacco use: Secondary | ICD-10-CM | POA: Diagnosis not present

## 2015-04-20 DIAGNOSIS — Z8673 Personal history of transient ischemic attack (TIA), and cerebral infarction without residual deficits: Secondary | ICD-10-CM | POA: Diagnosis not present

## 2015-04-20 DIAGNOSIS — I252 Old myocardial infarction: Secondary | ICD-10-CM | POA: Diagnosis not present

## 2015-04-20 DIAGNOSIS — I10 Essential (primary) hypertension: Secondary | ICD-10-CM | POA: Diagnosis not present

## 2015-04-20 DIAGNOSIS — E119 Type 2 diabetes mellitus without complications: Secondary | ICD-10-CM | POA: Diagnosis not present

## 2015-04-20 DIAGNOSIS — E780 Pure hypercholesterolemia: Secondary | ICD-10-CM | POA: Diagnosis not present

## 2015-04-20 DIAGNOSIS — M1990 Unspecified osteoarthritis, unspecified site: Secondary | ICD-10-CM | POA: Insufficient documentation

## 2015-04-20 DIAGNOSIS — Z79899 Other long term (current) drug therapy: Secondary | ICD-10-CM | POA: Diagnosis not present

## 2015-04-20 DIAGNOSIS — R220 Localized swelling, mass and lump, head: Secondary | ICD-10-CM | POA: Diagnosis present

## 2015-04-20 DIAGNOSIS — K1120 Sialoadenitis, unspecified: Secondary | ICD-10-CM | POA: Diagnosis not present

## 2015-04-20 DIAGNOSIS — F419 Anxiety disorder, unspecified: Secondary | ICD-10-CM | POA: Insufficient documentation

## 2015-04-20 LAB — BASIC METABOLIC PANEL
ANION GAP: 10 (ref 5–15)
BUN: 16 mg/dL (ref 6–20)
CHLORIDE: 100 mmol/L — AB (ref 101–111)
CO2: 28 mmol/L (ref 22–32)
CREATININE: 1.32 mg/dL — AB (ref 0.61–1.24)
Calcium: 10.1 mg/dL (ref 8.9–10.3)
GFR calc non Af Amer: 59 mL/min — ABNORMAL LOW (ref >60)
Glucose, Bld: 129 mg/dL — ABNORMAL HIGH (ref 65–99)
POTASSIUM: 4.1 mmol/L (ref 3.5–5.1)
SODIUM: 138 mmol/L (ref 135–145)

## 2015-04-20 LAB — CBC WITH DIFFERENTIAL/PLATELET
Basophils Absolute: 0 10*3/uL (ref 0.0–0.1)
Basophils Relative: 0 % (ref 0–1)
EOS ABS: 0.1 10*3/uL (ref 0.0–0.7)
EOS PCT: 1 % (ref 0–5)
HEMATOCRIT: 37.5 % — AB (ref 39.0–52.0)
HEMOGLOBIN: 12.3 g/dL — AB (ref 13.0–17.0)
Lymphocytes Relative: 19 % (ref 12–46)
Lymphs Abs: 2 10*3/uL (ref 0.7–4.0)
MCH: 29.3 pg (ref 26.0–34.0)
MCHC: 32.8 g/dL (ref 30.0–36.0)
MCV: 89.3 fL (ref 78.0–100.0)
MONO ABS: 0.6 10*3/uL (ref 0.1–1.0)
MONOS PCT: 6 % (ref 3–12)
Neutro Abs: 8 10*3/uL — ABNORMAL HIGH (ref 1.7–7.7)
Neutrophils Relative %: 74 % (ref 43–77)
Platelets: 331 10*3/uL (ref 150–400)
RBC: 4.2 MIL/uL — ABNORMAL LOW (ref 4.22–5.81)
RDW: 13.8 % (ref 11.5–15.5)
WBC: 10.7 10*3/uL — ABNORMAL HIGH (ref 4.0–10.5)

## 2015-04-20 MED ORDER — HYDROMORPHONE HCL 1 MG/ML IJ SOLN
1.0000 mg | Freq: Once | INTRAMUSCULAR | Status: AC
Start: 2015-04-20 — End: 2015-04-20
  Administered 2015-04-20: 1 mg via INTRAVENOUS
  Filled 2015-04-20: qty 1

## 2015-04-20 MED ORDER — HYDROMORPHONE HCL 1 MG/ML IJ SOLN
0.5000 mg | Freq: Once | INTRAMUSCULAR | Status: AC
  Administered 2015-04-20: 0.5 mg via INTRAVENOUS
  Filled 2015-04-20: qty 1

## 2015-04-20 MED ORDER — SODIUM CHLORIDE 0.9 % IV BOLUS (SEPSIS)
500.0000 mL | Freq: Once | INTRAVENOUS | Status: AC
  Administered 2015-04-20: 500 mL via INTRAVENOUS

## 2015-04-20 MED ORDER — CLINDAMYCIN HCL 150 MG PO CAPS: 450.0000 mg | ORAL_CAPSULE | Freq: Three times a day (TID) | ORAL | Status: DC

## 2015-04-20 MED ORDER — ONDANSETRON HCL 4 MG/2ML IJ SOLN
4.0000 mg | Freq: Once | INTRAMUSCULAR | Status: AC
Start: 2015-04-20 — End: 2015-04-20
  Administered 2015-04-20: 4 mg via INTRAVENOUS
  Filled 2015-04-20: qty 2

## 2015-04-20 MED ORDER — CLINDAMYCIN PHOSPHATE 600 MG/50ML IV SOLN
600.0000 mg | Freq: Once | INTRAVENOUS | Status: AC
  Administered 2015-04-20: 600 mg via INTRAVENOUS
  Filled 2015-04-20: qty 50

## 2015-04-20 MED ORDER — CLINDAMYCIN PHOSPHATE 600 MG/50ML IV SOLN
INTRAVENOUS | Status: AC
  Filled 2015-04-20: qty 50

## 2015-04-20 MED ORDER — IOHEXOL 300 MG/ML  SOLN
100.0000 mL | Freq: Once | INTRAMUSCULAR | Status: AC | PRN
  Administered 2015-04-20: 80 mL via INTRAVENOUS

## 2015-04-20 MED ORDER — OXYCODONE-ACETAMINOPHEN 5-325 MG PO TABS: 1.0000 | ORAL_TABLET | Freq: Four times a day (QID) | ORAL | Status: DC | PRN

## 2015-04-20 NOTE — Progress Notes (Signed)
Pt to discontinue metformin x 48 hours, pt aware and given instructions

## 2015-04-20 NOTE — ED Notes (Signed)
MD notified pt still having pain. Per MD she does not want to give more Dilaudid at this time due to possible airway compromise.

## 2015-04-20 NOTE — ED Provider Notes (Signed)
CSN: [id number]     Arrival date & time 04/20/15  1601 History   First MD Initiated Contact with Patient 04/20/15 [phone]     Chief Complaint  Patient presents with  . Oral Swelling     The history is provided by the patient and the spouse. No language interpreter was used.   Raymond Barrera presents for evaluation of mouth/neck swelling and pain.  He has had three days of neck and mouth swelling and pain.  Pain is greatly increased today.  He is having difficulty swallowing but can swallow his secretions. No fevers. No difficulty breathing. He had a recent hip replacement.  He has a hx/o DM, HTN, takes a daily ASA.  Sxs are severe, constant, worsening.    Past Medical History  Diagnosis Date  . High cholesterol     takes Zocor daily  . Stroke 08/2013  . Arthritis   . Headache     migraines  . Anxiety     takes Valium daily as needed  . Diabetes mellitus without complication     takes Metformin daily  . Hypertension     takes Benazepril and HCTZ  daily  . Myocardial infarction 1987  . TIA (transient ischemic attack) 2014    x 7   . Pneumonia     hx of-80's  . Joint pain   . Joint swelling   . Back pain     hx of buldging disc  . PONV (postoperative nausea and vomiting)     took a while for him to wake up after previous anesthesia   Past Surgical History  Procedure Laterality Date  . Ankle surgery  2008    left ankle-otif-[facility]  . Mass excision  09/13/2012    Procedure: EXCISION MASS;  Surgeon: Jamesetta So, MD;  Location: [facility];  Service: General;  Laterality: N/A;  . Tonsillectomy    . Lumbar laminectomy/decompression microdiscectomy Right 10/17/2014    Procedure: LUMBAR LAMINECTOMY/DECOMPRESSION MICRODISCECTOMY 2 LEVELS;  Surgeon: [name], MD;  Location: [place] [facility];  Service: Neurosurgery;  Laterality: Right;  Right L45 L5S1 laminectomy and foraminotomy  . 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 Total hip arthroplasty Right 03/20/2015    Procedure: TOTAL HIP  ARTHROPLASTY ANTERIOR APPROACH;  Surgeon: Renette Butters, MD;  Location: [place];  Service: Orthopedics;  Laterality: Right;  . Back surgery     History reviewed. No pertinent family history. History  Substance Use Topics  . Smoking status: Current Every Day Smoker -- 0.75 packs/day for 47 years    Types: Cigarettes  . Smokeless tobacco: Never Used  . Alcohol Use: No     Comment: no alcohol in 2 yrs    Review of Systems  All other systems reviewed and are negative.     Allergies  Poison ivy extract  Home Medications   Prior to Admission medications   Medication Sig Start Date End Date Taking? Authorizing Provider  aspirin 325 MG tablet Take 1 tablet (325 mg total) by mouth daily. 03/20/15   Brittney Claiborne Billings, PA-C  benazepril (LOTENSIN) 10 MG tablet Take 10 mg by mouth daily.    Historical Provider, MD  celecoxib (CELEBREX) 100 MG capsule Take 1 capsule (100 mg total) by mouth 2 (two) times daily. 03/20/15   Brittney Claiborne Billings, PA-C  diazepam (VALIUM) 5 MG tablet Take 1 tablet (5 mg total) by mouth every 6 (six) hours as needed for anxiety. 10/17/14   [name], MD  docusate sodium (COLACE)  100 MG capsule Take 1 capsule (100 mg total) by mouth 2 (two) times daily. 03/20/15   Brittney Claiborne Billings, PA-C  folic acid (FOLVITE) 875 MCG tablet Take 400 mcg by mouth daily.    Historical Provider, MD  GLUCOSAMINE-CHONDROITIN PO Take 1 tablet by mouth daily.    Historical Provider, MD  hydrochlorothiazide (HYDRODIURIL) 25 MG tablet Take 1 tablet (25 mg total) by mouth daily. 08/31/13   Garvin Fila, MD  metFORMIN (GLUCOPHAGE) 500 MG tablet Take 500 mg by mouth 2 (two) times daily with a meal.    Historical Provider, MD  methocarbamol (ROBAXIN) 500 MG tablet Take 1 tablet (500 mg total) by mouth 4 (four) times daily. 03/20/15   Brittney Claiborne Billings, PA-C  Omega-3 Fatty Acids (FISH OIL) 1000 MG CAPS Take 1 capsule by mouth every morning.    Historical Provider, MD  ondansetron (ZOFRAN) 4 MG tablet Take 1  tablet (4 mg total) by mouth every 8 (eight) hours as needed for nausea or vomiting. 10/18/14   [name], MD  oxyCODONE-acetaminophen (PERCOCET) 10-325 MG per tablet Take 1 tablet by mouth every 4 (four) hours as needed for pain. 03/20/15   Brittney Claiborne Billings, PA-C  simvastatin (ZOCOR) 40 MG tablet Take 40 mg by mouth daily.    Historical Provider, MD   BP 161/125 mmHg  Pulse 107  Temp(Src) 98.4 F (36.9 C) (Oral)  Resp 20  Ht 6\' 5"  (1.956 m)  Wt 250 lb (113.399 kg)  BMI 29.64 kg/m2  SpO2 98% Physical Exam  Constitutional: He is oriented to person, place, and time. He appears well-developed and well-nourished. He appears distressed.  HENT:  Head: Normocephalic and atraumatic.  Poor dentition.  Elevated floor of mouth and tongue.  No posterior OP swelling.    Neck:  Tender cervical LAD on the left.   Cardiovascular: Regular rhythm.   No murmur heard. tachycardic  Pulmonary/Chest: Effort normal and breath sounds normal. No respiratory distress.  No stridor  Abdominal: Soft. There is no tenderness. There is no rebound and no guarding.  Musculoskeletal: He exhibits no edema or tenderness.  Neurological: He is alert and oriented to person, place, and time.  Skin: Skin is warm and dry.  Psychiatric: He has a normal mood and affect. His behavior is normal.  Nursing note and vitals reviewed.   ED Course  Procedures (including critical care time) Labs Review Labs Reviewed  BASIC METABOLIC PANEL - Abnormal; Notable for the following:    Chloride 100 (*)    Glucose, Bld 129 (*)    Creatinine, Ser 1.32 (*)    GFR calc non Af Amer 59 (*)    All other components within normal limits  CBC WITH DIFFERENTIAL/PLATELET - Abnormal; Notable for the following:    WBC 10.7 (*)    RBC 4.20 (*)    Hemoglobin 12.3 (*)    HCT 37.5 (*)    Neutro Abs 8.0 (*)    All other components within normal limits    Imaging Review Ct Soft Tissue Neck W Contrast  04/20/2015   CLINICAL DATA:   Oral/left-sided neck swelling beginning 2-3 days ago. Increasing pain with swallowing.  EXAM: CT NECK WITH CONTRAST  TECHNIQUE: Multidetector CT imaging of the neck was performed using the standard protocol following the bolus administration of intravenous contrast.  CONTRAST:  66mL OMNIPAQUE IOHEXOL 300 MG/ML  SOLN  COMPARISON:  None.  FINDINGS: Pharynx and larynx: The nasopharynx, oropharynx, hypopharynx, and larynx are unremarkable without evidence of mass.  Salivary  glands: The parotid glands and right submandibular gland are unremarkable. The left submandibular gland is enlarged and edematous with surrounding inflammatory change in the submandibular space extending into the parapharyngeal space and left floor of mouth. There is a 6 mm calculus in the distal left submandibular duct. The duct is dilated proximal to this up to 10 mm in diameter. There is also a 5 mm calculus in the proximal left submandibular duct.  Thyroid: Asymmetric enlargement of the right thyroid lobe without a discrete nodule identified.  Lymph nodes: Asymmetric left submandibular lymph nodes measure up to 9 mm in short axis and are likely reactive. Slightly asymmetric left level II lymph nodes measure up to 12 mm in short axis are also likely reactive.  Vascular: Major vascular structures of the neck are grossly patent. There is a partially retropharyngeal course of the left internal carotid artery.  Limited intracranial: The visualized portion the brain is unremarkable.  Visualized orbits: Unremarkable.  Mastoids and visualized paranasal sinuses: Mild right maxillary sinus mucosal thickening. Underdeveloped mastoid air cells bilaterally.  Skeleton: Dental disease is noted with several periapical lucencies, most notably involving the mandibular left lateral incisor. Mild-to-moderate multilevel cervical disc degeneration is noted.  Upper chest: The visualized lung apices demonstrate mild respiratory motion artifact with areas of likely  scarring and atelectasis.  IMPRESSION: Acute left submandibular sialadenitis due to 2 obstructing stones in the submandibular duct. Prominent inflammation throughout the left submandibular space extending into the left floor of mouth.   Electronically Signed   By: Logan Bores   On: 04/20/2015 10:20     EKG Interpretation None      MDM   Final diagnoses:  Sialoadenitis of submandibular gland    Pt here for evaluation of neck pain and swelling.  Pt with moderate swelling of neck and mild submental swelling, no stridor on exam, no respiratory distress. CT scan demonstrates sialoadenitis with two obstructing stones.  Discussed with Dr. Benjamine Mola with ENT - recommends abx, compresses, outpatient followup. Discussed with patient findings of studies and treatment.  Discussed home care as well as close return precautions.     Quintella Reichert, MD 04/20/15 1213

## 2015-04-20 NOTE — Discharge Instructions (Signed)
You have two stones in your salivary gland.  Drink plenty of fluids and eat sour candies.  You can apply warm and cool compresses to the swollen areas.  Get rechecked immediately if you develop fevers, worsening swelling, breathing difficulty, or new concerning symptoms.     Sialadenitis Sialadenitis is an inflammation (soreness) of the salivary glands. The parotid is the main salivary gland. It lies behind the angle of the jaw below the ear. The saliva produced comes out of a tiny opening (duct) inside the cheek on either side. This is usually at the level of the upper back teeth. If it is swollen, the ear is pushed up and out. This helps tell this condition apart from a simple lymph gland infection (swollen glands) in the same area. Mumps has mostly disappeared since the start of immunization against mumps. Now the most common cause of parotitis is germ (bacterial) infection or inflammation of the lymphatics (the lymph channels). The other major salivary gland is located in the floor of the mouth. Smaller salivary glands are located in the mouth. This includes the:  Lips.  Lining of the mouth.  Pharynx.  Hard palate (front part of the roof of the mouth). The salivary glands do many things, including:  Lubrication.  Breaking down food.  Production of hormones and antibodies (to protect against germs which may cause illness).  Help with the sense of taste. ACUTE BACTERIAL SIALADENITIS This is a sudden inflammatory response to bacterial infection. This causes redness, pain, swelling and tenderness over the infected gland. In the past, it was common in dehydrated and debilitated patients often following an operation. It is now more commonly seen:  After radiotherapy.  In patients with poor immune systems. Treatment is:  The correction of fluid balance (rehydration).  Medicine that kill germs (antibiotics).  Pain relief. CHRONIC RECURRENT SIALADENITIS This refers to repeated episodes  of discomfort and swelling of one of the salivary glands. It often occurs after eating. Chronic sialadenitis is usually less painful. It is associated with recurrent enlargement of a salivary gland, often following meals, and typically with an absence of redness. The chronic form of the disease often is associated with conditions linked to decreased salivary flow, rather than dehydration (loss of body fluids). These conditions include:  A stone, or concretion, formed in the gallbladder, kidneys, or other parts of the body (calculi).  Salivary stasis.  A change in the fluid and electrolyte (the salts in your body fluids) makeup of the gland. It is treated with:  Gland massage.  Methods to stimulate the flow of saliva, (for example, lemon juice).  Antibiotics if required. Surgery to remove the gland is possible, but its benefits need to be balanced against risks.  VIRAL SIALADENITIS Several viruses infect the salivary glands. Some of these include the mumps virus that commonly infects the parotid gland. Other viruses causing problems are:  The HIV virus.  Herpes.  Some of the influenza ("flu") viruses. RECURRENT SIALADENITIS IN CHILDREN This condition is thought to be due to swelling or ballooning of the ducts. It results in the same symptoms as acute bacterial parotitis. It is usually caused by germs (bacteria). It is often treated using penicillin. It may get well without treatment. Surgery is usually not required. TUBERCULOUS SIALADENITIS The salivary glands may become infected with the same bacteria causing tuberculosis ("TB"). Treatment is with anti-tuberculous antibiotic therapy. OTHER UNCOMMON CAUSES OF SIALADENITIS   Sjogren's syndrome is a condition in which arthritis is associated with a decrease in  activity of the glands of the body that produce saliva and tears. The diagnosis is made with blood tests or by examination of a piece of tissue from the inside of the lip. Some people  with this condition are bothered by:  A dry mouth.  Intermittent salivary gland enlargement.  Atypical mycobacteria is a germ similar to tuberculosis. It often infects children. It is often resistant to antibiotic treatment. It may require surgical treatment to remove the infected salivary gland.  Actinomycosis is an infection of the parotid gland that may also involve the overlying skin. The diagnosis is made by detecting granules of sulphur produced by the bacteria on microscopic examination. Treatment is a prolonged course of penicillin for up to one year.  Nutritional causes include vitamin deficiencies and bulimia.  Diabetes and problems with your thyroid.  Obesity, cirrhosis, and malabsorption are some metabolic causes. HOME CARE INSTRUCTIONS   Apply ice bags every 2 hours for 15-20 minutes, while awake, to the sore gland for 24 hours, then as directed by your caregiver. Place the ice in a plastic bag with a towel around it to prevent frostbite to the skin.  Only take over-the-counter or prescription medicines for pain, discomfort, or fever as directed by your caregiver. SEEK IMMEDIATE MEDICAL CARE IF:   There is increased pain or swelling in your gland that is not controlled with medicine.  An oral temperature above 102 F (38.9 C) develops, not controlled by medicine.  You develop difficulty opening your mouth, swallowing, or speaking. Document Released: 05/09/2002 Document Revised: 02/09/2012 Document Reviewed: 07/03/2008 [facility] Patient Information 2015 [place], [place]. This information is not intended to replace advice given to you by your health care provider. Make sure you discuss any questions you have with your health care provider.

## 2015-04-20 NOTE — ED Notes (Signed)
Pt c/o swelling to left side of neck and under his tongue that started 2-3 days ago, pain is increased with swallowing, movement of neck area,

## 2015-09-14 ENCOUNTER — Other Ambulatory Visit (HOSPITAL_COMMUNITY): Payer: Self-pay | Admitting: Orthopedic Surgery

## 2015-09-14 DIAGNOSIS — M25551 Pain in right hip: Secondary | ICD-10-CM

## 2015-09-28 ENCOUNTER — Encounter (HOSPITAL_COMMUNITY): Payer: Medicaid Other

## 2015-10-12 ENCOUNTER — Encounter (HOSPITAL_COMMUNITY)
Admission: RE | Admit: 2015-10-12 | Discharge: 2015-10-12 | Disposition: A | Discharge status: Home / Self Care | Payer: Medicaid Other | Source: Ambulatory Visit | Attending: Orthopedic Surgery | Admitting: Orthopedic Surgery

## 2015-10-12 DIAGNOSIS — M25551 Pain in right hip: Secondary | ICD-10-CM | POA: Insufficient documentation

## 2015-10-12 MED ORDER — TECHNETIUM TC 99M MEDRONATE IV KIT
26.0000 | PACK | Freq: Once | INTRAVENOUS | Status: AC | PRN
  Administered 2015-10-12: 26 via INTRAVENOUS

## 2015-11-29 ENCOUNTER — Emergency Department (HOSPITAL_COMMUNITY)
Admission: EM | Admit: 2015-11-29 | Discharge: 2015-11-29 | Disposition: A | Discharge status: Home / Self Care | Payer: Medicaid Other | Attending: Emergency Medicine | Admitting: Emergency Medicine

## 2015-11-29 ENCOUNTER — Encounter (HOSPITAL_COMMUNITY): Payer: Self-pay | Admitting: Emergency Medicine

## 2015-11-29 DIAGNOSIS — Z7984 Long term (current) use of oral hypoglycemic drugs: Secondary | ICD-10-CM | POA: Diagnosis not present

## 2015-11-29 DIAGNOSIS — I252 Old myocardial infarction: Secondary | ICD-10-CM | POA: Insufficient documentation

## 2015-11-29 DIAGNOSIS — F419 Anxiety disorder, unspecified: Secondary | ICD-10-CM | POA: Diagnosis not present

## 2015-11-29 DIAGNOSIS — Z8701 Personal history of pneumonia (recurrent): Secondary | ICD-10-CM | POA: Diagnosis not present

## 2015-11-29 DIAGNOSIS — E7800 Pure hypercholesterolemia, unspecified: Secondary | ICD-10-CM | POA: Diagnosis not present

## 2015-11-29 DIAGNOSIS — E119 Type 2 diabetes mellitus without complications: Secondary | ICD-10-CM | POA: Insufficient documentation

## 2015-11-29 DIAGNOSIS — I10 Essential (primary) hypertension: Secondary | ICD-10-CM | POA: Diagnosis not present

## 2015-11-29 DIAGNOSIS — Z791 Long term (current) use of non-steroidal anti-inflammatories (NSAID): Secondary | ICD-10-CM | POA: Diagnosis not present

## 2015-11-29 DIAGNOSIS — Z79899 Other long term (current) drug therapy: Secondary | ICD-10-CM | POA: Insufficient documentation

## 2015-11-29 DIAGNOSIS — Z8673 Personal history of transient ischemic attack (TIA), and cerebral infarction without residual deficits: Secondary | ICD-10-CM | POA: Insufficient documentation

## 2015-11-29 DIAGNOSIS — R51 Headache: Secondary | ICD-10-CM | POA: Insufficient documentation

## 2015-11-29 DIAGNOSIS — M1990 Unspecified osteoarthritis, unspecified site: Secondary | ICD-10-CM | POA: Diagnosis not present

## 2015-11-29 DIAGNOSIS — F17210 Nicotine dependence, cigarettes, uncomplicated: Secondary | ICD-10-CM | POA: Diagnosis not present

## 2015-11-29 DIAGNOSIS — Z7982 Long term (current) use of aspirin: Secondary | ICD-10-CM | POA: Insufficient documentation

## 2015-11-29 LAB — CBC WITH DIFFERENTIAL/PLATELET
Basophils Absolute: 0 10*3/uL (ref 0.0–0.1)
Basophils Relative: 0 %
Eosinophils Absolute: 0.1 10*3/uL (ref 0.0–0.7)
Eosinophils Relative: 1 %
HCT: 42.6 % (ref 39.0–52.0)
Hemoglobin: 14.4 g/dL (ref 13.0–17.0)
LYMPHS ABS: 1.7 10*3/uL (ref 0.7–4.0)
Lymphocytes Relative: 19 %
MCH: 29.4 pg (ref 26.0–34.0)
MCHC: 33.8 g/dL (ref 30.0–36.0)
MCV: 87.1 fL (ref 78.0–100.0)
MONO ABS: 0.7 10*3/uL (ref 0.1–1.0)
Monocytes Relative: 7 %
Neutro Abs: 6.4 10*3/uL (ref 1.7–7.7)
Neutrophils Relative %: 73 %
Platelets: 250 10*3/uL (ref 150–400)
RBC: 4.89 MIL/uL (ref 4.22–5.81)
RDW: 14.1 % (ref 11.5–15.5)
WBC: 8.9 10*3/uL (ref 4.0–10.5)

## 2015-11-29 LAB — COMPREHENSIVE METABOLIC PANEL
ALK PHOS: 81 U/L (ref 38–126)
ALT: 39 U/L (ref 17–63)
ANION GAP: 10 (ref 5–15)
AST: 26 U/L (ref 15–41)
Albumin: 4.1 g/dL (ref 3.5–5.0)
BUN: 18 mg/dL (ref 6–20)
CO2: 26 mmol/L (ref 22–32)
Calcium: 10 mg/dL (ref 8.9–10.3)
Chloride: 103 mmol/L (ref 101–111)
Creatinine, Ser: 1.12 mg/dL (ref 0.61–1.24)
GFR calc Af Amer: >60 mL/min (ref >60)
GFR calc non Af Amer: >60 mL/min (ref >60)
Glucose, Bld: 125 mg/dL — ABNORMAL HIGH (ref 65–99)
POTASSIUM: 4 mmol/L (ref 3.5–5.1)
SODIUM: 139 mmol/L (ref 135–145)
Total Bilirubin: 0.7 mg/dL (ref 0.3–1.2)
Total Protein: 7.6 g/dL (ref 6.5–8.1)

## 2015-11-29 LAB — TROPONIN I: Troponin I: <0.03 ng/mL (ref <0.031)

## 2015-11-29 MED ORDER — KETOROLAC TROMETHAMINE 60 MG/2ML IM SOLN
60.0000 mg | Freq: Once | INTRAMUSCULAR | Status: AC
  Filled 2015-11-29: qty 2

## 2015-11-29 MED ORDER — ONDANSETRON HCL 4 MG PO TABS
4.0000 mg | ORAL_TABLET | Freq: Once | ORAL | Status: AC
  Administered 2015-11-29: 4 mg via ORAL
  Filled 2015-11-29: qty 1

## 2015-11-29 MED ORDER — KETOROLAC TROMETHAMINE 60 MG/2ML IM SOLN
60.0000 mg | Freq: Once | INTRAMUSCULAR | Status: AC
  Administered 2015-11-29: 60 mg via INTRAMUSCULAR
  Filled 2015-11-29: qty 2

## 2015-11-29 MED ORDER — LORAZEPAM 1 MG PO TABS
1.0000 mg | ORAL_TABLET | Freq: Once | ORAL | Status: AC
  Administered 2015-11-29: 1 mg via ORAL
  Filled 2015-11-29: qty 1

## 2015-11-29 MED ORDER — HYDROCODONE-ACETAMINOPHEN 5-325 MG PO TABS
1.0000 | ORAL_TABLET | Freq: Once | ORAL | Status: AC
  Administered 2015-11-29: 1 via ORAL
  Filled 2015-11-29: qty 1

## 2015-11-29 NOTE — ED Notes (Signed)
PT c/o HTN with migraine headache x4 days. PT denies any light or sound sensitivity.

## 2015-11-29 NOTE — Discharge Instructions (Signed)
Follow-up with her doctor tomorrow as scheduled

## 2015-11-29 NOTE — ED Provider Notes (Signed)
CSN: [id number]     Arrival date & time 11/29/15  1545 History   By signing my name below, I, [name], attest that this documentation has been prepared under the direction and in the presence of Milton Ferguson, MD.  Electronically Signed: [name], ED Scribe. 11/29/2015. 8:14 PM.   Chief Complaint  Patient presents with  . Hypertension   Patient is a 54 y.o. male presenting with hypertension. The history is provided by the patient. No language interpreter was used.  Hypertension This is a chronic problem. The current episode started 3 to 5 hours ago. The problem occurs constantly. The problem has not changed since onset.Associated symptoms include headaches. Pertinent negatives include no chest pain, no abdominal pain and no shortness of breath. Nothing aggravates the symptoms. Nothing relieves the symptoms. He has tried nothing for the symptoms.    HPI Comments: Raymond Barrera is a 54 y.o. male with a PMHx of hyperlipidemia, stroke, DM, HTN, MI, and TIA who presents to the Emergency Department here for hypertension this evening. Pt currently takes Lotensin 20 mg daily which has not recently changes. However, pt has been on HCTZ in the past. He now c/o an intermittent, ongoing HA x 4 days. No aggravating or alleviating factors at this time. No OTC medications or home remedies attempted prior to arrival. No recent fever, chills, nausea, vomiting, abdominal pain, chest pain, or shortness of breath. [name] plans to follow up with his PCP tomorrow. No known allergies to medications.  PCP: Maricela Curet, MD    Past Medical History  Diagnosis Date  . High cholesterol     takes Zocor daily  . Stroke ([place]) 08/2013  . Arthritis   . Headache     migraines  . Anxiety     takes Valium daily as needed  . Diabetes mellitus without complication ([place])     takes Metformin daily  . Hypertension     takes Benazepril and HCTZ  daily  . Myocardial infarction ([place]) 1987  . TIA (transient  ischemic attack) 2014    x 7   . Pneumonia     hx of-80's  . Joint pain   . Joint swelling   . Back pain     hx of buldging disc  . PONV (postoperative nausea and vomiting)     took a while for him to wake up after previous anesthesia   Past Surgical History  Procedure Laterality Date  . Ankle surgery  2008    left ankle-otif-[facility]  . Mass excision  09/13/2012    Procedure: EXCISION MASS;  Surgeon: Jamesetta So, MD;  Location: [facility];  Service: General;  Laterality: N/A;  . Tonsillectomy    . Lumbar laminectomy/decompression microdiscectomy Right 10/17/2014    Procedure: LUMBAR LAMINECTOMY/DECOMPRESSION MICRODISCECTOMY 2 LEVELS;  Surgeon: [name], MD;  Location: [facility];  Service: Neurosurgery;  Laterality: Right;  Right L45 L5S1 laminectomy and foraminotomy  . 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 Total hip arthroplasty Right 03/20/2015    Procedure: TOTAL HIP ARTHROPLASTY ANTERIOR APPROACH;  Surgeon: Renette Butters, MD;  Location: [facility];  Service: Orthopedics;  Laterality: Right;  . Back surgery     History reviewed. No pertinent family history. Social History  Substance Use Topics  . Smoking status: Current Every Day Smoker -- 0.75 packs/day for 47 years    Types: Cigarettes  . Smokeless tobacco: Never Used  . Alcohol Use: No     Comment: no alcohol  in 2 yrs    Review of Systems  Constitutional: Negative for fever and chills.  HENT: Negative for congestion, rhinorrhea and sore throat.   Eyes: Negative for visual disturbance.  Respiratory: Negative for cough and shortness of breath.   Cardiovascular: Negative for chest pain.  Gastrointestinal: Negative for nausea, vomiting, abdominal pain and diarrhea.  Genitourinary: Negative for dysuria.  Musculoskeletal: Negative for back pain, joint swelling and neck pain.  Skin: Negative for rash.  Neurological: Positive for headaches.  Psychiatric/Behavioral: Negative for confusion.      Allergies   Poison ivy extract  Home Medications   Prior to Admission medications   Medication Sig Start Date End Date Taking? Authorizing Provider  aspirin 325 MG tablet Take 1 tablet (325 mg total) by mouth daily. 03/20/15  Yes Brittney Kelly, PA-C  benazepril (LOTENSIN) 20 MG tablet Take 20 mg by mouth daily.   Yes Historical Provider, MD  diazepam (VALIUM) 5 MG tablet Take 1 tablet (5 mg total) by mouth every 6 (six) hours as needed for anxiety. 10/17/14  Yes [name], MD  celecoxib (CELEBREX) 100 MG capsule Take 1 capsule (100 mg total) by mouth 2 (two) times daily. 03/20/15   Brittney Claiborne Billings, PA-C  metFORMIN (GLUCOPHAGE) 500 MG tablet Take 500 mg by mouth 2 (two) times daily with a meal.    Historical Provider, MD  methocarbamol (ROBAXIN) 500 MG tablet Take 1 tablet (500 mg total) by mouth 4 (four) times daily. Patient taking differently: Take 500 mg by mouth 4 (four) times daily as needed for muscle spasms.  03/20/15   Brittney Claiborne Billings, PA-C  Omega-3 Fatty Acids (FISH OIL) 1000 MG CAPS Take 1 capsule by mouth every morning.    Historical Provider, MD  ondansetron (ZOFRAN) 4 MG tablet Take 1 tablet (4 mg total) by mouth every 8 (eight) hours as needed for nausea or vomiting. 10/18/14   [name], MD  oxyCODONE-acetaminophen (PERCOCET/ROXICET) 5-325 MG per tablet Take 1 tablet by mouth every 6 (six) hours as needed for severe pain. 04/20/15   Quintella Reichert, MD  simvastatin (ZOCOR) 40 MG tablet Take 40 mg by mouth daily.    Historical Provider, MD   Triage Vitals: BP 161/107 mmHg  Pulse 80  Temp(Src) 97.8 F (36.6 C) (Oral)  Resp 20  Ht 6\' 5"  (1.956 m)  Wt 275 lb (124.739 kg)  BMI 32.60 kg/m2  SpO2 97%   Physical Exam  Constitutional: He is oriented to person, place, and time. He appears well-developed.  HENT:  Head: Normocephalic.  Eyes: Conjunctivae and EOM are normal. No scleral icterus.  Neck: Neck supple. No thyromegaly present.  Cardiovascular: Normal rate and regular  rhythm.  Exam reveals no gallop and no friction rub.   No murmur heard. Pulmonary/Chest: No stridor. He has no wheezes. He has no rales. He exhibits no tenderness.  Abdominal: He exhibits no distension. There is no tenderness. There is no rebound.  Musculoskeletal: Normal range of motion. He exhibits no edema.  Lymphadenopathy:    He has no cervical adenopathy.  Neurological: He is oriented to person, place, and time. He exhibits normal muscle tone. Coordination normal.  Skin: No rash noted. No erythema.  Psychiatric: He has a normal mood and affect. His behavior is normal.    ED Course  Procedures (including critical care time)  DIAGNOSTIC STUDIES: Oxygen Saturation is 97% on RA, adequate by my interpretation.    COORDINATION OF CARE: 8:10 PM- Will order CBC, CMP, and Troponin I. Will give  Norco. Discussed treatment plan with pt at bedside and pt agreed to plan.     Labs Review Labs Reviewed  COMPREHENSIVE METABOLIC PANEL - Abnormal; Notable for the following:    Glucose, Bld 125 (*)    All other components within normal limits  CBC WITH DIFFERENTIAL/PLATELET  TROPONIN I    Imaging Review No results found. I have personally reviewed and evaluated these images and lab results as part of my medical decision-making.   EKG Interpretation None      MDM   Final diagnoses:  None    Patient's headache improved with treatment. His blood pressure is poorly controlled. He will be followed up with his PCP tomorrow so I will not adjust his blood pressure medicine now  The chart was scribed for me under my direct supervision.  I personally performed the history, physical, and medical decision making and all procedures in the evaluation of this patient.Milton Ferguson, MD 11/29/15 [phone]

## 2015-12-20 NOTE — H&P (Signed)
TOTAL HIP ADMISSION H&P  Patient is admitted for left total hip arthroplasty.  Subjective:  Chief Complaint: left hip pain  HPI: Raymond Barrera, 55 y.o. male, has a history of pain and functional disability in the left hip(s) due to arthritis and patient has failed non-surgical conservative treatments for greater than 12 weeks to include NSAID's and/or analgesics, corticosteriod injections, use of assistive devices and activity modification.  Onset of symptoms was gradual starting 3 years ago with gradually worsening course since that time.The patient noted no past surgery on the left hip(s).  Patient currently rates pain in the left hip at 9 out of 10 with activity. Patient has night pain, worsening of pain with activity and weight bearing and pain that interfers with activities of daily living. Patient has evidence of joint space narrowing by imaging studies. This condition presents safety issues increasing the risk of falls.  There is no current active infection.  Patient Active Problem List   Diagnosis Date Noted  . DJD (degenerative joint disease) 03/20/2015  . Primary osteoarthritis of right hip 02/26/2015  . Lumbar spondylosis 10/17/2014  . Lumbago 07/25/2014  . Abnormality of gait 07/25/2014  . Difficulty in walking(719.7) 07/25/2014  . TIA (transient ischemic attack) 03/09/2013  . Cocaine abuse 03/09/2013  . Accelerated hypertension 03/09/2013  . Other and unspecified hyperlipidemia 03/09/2013  . Current smoker 03/09/2013   Past Medical History  Diagnosis Date  . High cholesterol     takes Zocor daily  . Stroke ([place]) 08/2013  . Arthritis   . Headache     migraines  . Anxiety     takes Valium daily as needed  . Diabetes mellitus without complication ([place])     takes Metformin daily  . Hypertension     takes Benazepril and HCTZ  daily  . Myocardial infarction ([place]) 1987  . TIA (transient ischemic attack) 2014    x 7   . Pneumonia     hx of-80's  . Joint pain   .  Joint swelling   . Back pain     hx of buldging disc  . PONV (postoperative nausea and vomiting)     took a while for him to wake up after previous anesthesia    Past Surgical History  Procedure Laterality Date  . Ankle surgery  2008    left ankle-otif-[facility]  . Mass excision  09/13/2012    Procedure: EXCISION MASS;  Surgeon: Jamesetta So, MD;  Location: [facility];  Service: General;  Laterality: N/A;  . Tonsillectomy    . Lumbar laminectomy/decompression microdiscectomy Right 10/17/2014    Procedure: LUMBAR LAMINECTOMY/DECOMPRESSION MICRODISCECTOMY 2 LEVELS;  Surgeon: [name], MD;  Location: [facility];  Service: Neurosurgery;  Laterality: Right;  Right L45 L5S1 laminectomy and foraminotomy  . 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 Total hip arthroplasty Right 03/20/2015    Procedure: TOTAL HIP ARTHROPLASTY ANTERIOR APPROACH;  Surgeon: Renette Butters, MD;  Location: [facility];  Service: Orthopedics;  Laterality: Right;  . Back surgery      No prescriptions prior to admission   Allergies  Allergen Reactions  . Poison Ivy Extract [Extract Of Poison Ivy] Itching and Rash    Social History  Substance Use Topics  . Smoking status: Current Every Day Smoker -- 0.75 packs/day for 47 years    Types: Cigarettes  . Smokeless tobacco: Never Used  . Alcohol Use: No     Comment: no alcohol in 2 yrs    No  family history on file.   Review of Systems  Constitutional: Negative for fever and chills.  HENT: Negative for congestion.   Eyes: Negative for double vision.  Respiratory: Negative for cough, shortness of breath and wheezing.   Cardiovascular: Negative for chest pain and palpitations.  Gastrointestinal: Negative for nausea, vomiting and abdominal pain.  Musculoskeletal:       Left hip pain with prolonged ambulation  Skin: Negative for rash.  Neurological: Negative for dizziness and seizures.  Psychiatric/Behavioral: Negative for depression and suicidal ideas.  All other  systems reviewed and are negative.   Objective:  Physical Exam  Vitals reviewed. Constitutional: He is oriented to person, place, and time. He appears well-developed and well-nourished.  HENT:  Head: Normocephalic and atraumatic.  Eyes: Conjunctivae and EOM are normal. Pupils are equal, round, and reactive to light.  Neck: Normal range of motion. Neck supple.  Cardiovascular: Normal rate and intact distal pulses.   Respiratory: Effort normal and breath sounds normal.  GI: Soft. Bowel sounds are normal.  Musculoskeletal: Normal range of motion.  Neurological: He is alert and oriented to person, place, and time.  Skin: Skin is warm and dry.  Psychiatric: He has a normal mood and affect. His behavior is normal. Judgment and thought content normal.    Vital signs in last 24 hours:    Labs:   Estimated body mass index is 32.60 kg/(m^2) as calculated from the following:   Height as of 11/29/15: 6\' 5"  (1.956 m).   Weight as of 11/29/15: 124.739 kg (275 lb).   Imaging Review Plain radiographs demonstrate severe degenerative joint disease of the left hip(s). The bone quality appears to be fair for age and reported activity level.  Assessment/Plan:  End stage arthritis, left hip(s)  The patient history, physical examination, clinical judgement of the provider and imaging studies are consistent with end stage degenerative joint disease of the left hip(s) and total hip arthroplasty is deemed medically necessary. The treatment options including medical management, injection therapy, arthroscopy and arthroplasty were discussed at length. The risks and benefits of total hip arthroplasty were presented and reviewed. The risks due to aseptic loosening, infection, stiffness, dislocation/subluxation,  thromboembolic complications and other imponderables were discussed.  The patient acknowledged the explanation, agreed to proceed with the plan and consent was signed. Patient is being admitted for  inpatient treatment for surgery, pain control, PT, OT, prophylactic antibiotics, VTE prophylaxis, progressive ambulation and ADL's and discharge planning.The patient is planning to be discharged home with home health services

## 2015-12-28 ENCOUNTER — Encounter (HOSPITAL_COMMUNITY)
Admission: RE | Admit: 2015-12-28 | Discharge: 2015-12-28 | Disposition: A | Discharge status: Home / Self Care | Payer: Medicaid Other | Source: Ambulatory Visit | Attending: Orthopedic Surgery | Admitting: Orthopedic Surgery

## 2015-12-28 ENCOUNTER — Encounter (HOSPITAL_COMMUNITY): Payer: Self-pay

## 2015-12-28 DIAGNOSIS — F17200 Nicotine dependence, unspecified, uncomplicated: Secondary | ICD-10-CM | POA: Diagnosis not present

## 2015-12-28 DIAGNOSIS — Z7982 Long term (current) use of aspirin: Secondary | ICD-10-CM | POA: Insufficient documentation

## 2015-12-28 DIAGNOSIS — R9431 Abnormal electrocardiogram [ECG] [EKG]: Secondary | ICD-10-CM | POA: Insufficient documentation

## 2015-12-28 DIAGNOSIS — Z8673 Personal history of transient ischemic attack (TIA), and cerebral infarction without residual deficits: Secondary | ICD-10-CM | POA: Diagnosis not present

## 2015-12-28 DIAGNOSIS — Z01818 Encounter for other preprocedural examination: Secondary | ICD-10-CM | POA: Diagnosis not present

## 2015-12-28 DIAGNOSIS — Z7984 Long term (current) use of oral hypoglycemic drugs: Secondary | ICD-10-CM | POA: Insufficient documentation

## 2015-12-28 DIAGNOSIS — I10 Essential (primary) hypertension: Secondary | ICD-10-CM | POA: Insufficient documentation

## 2015-12-28 DIAGNOSIS — E785 Hyperlipidemia, unspecified: Secondary | ICD-10-CM | POA: Diagnosis not present

## 2015-12-28 DIAGNOSIS — Z0183 Encounter for blood typing: Secondary | ICD-10-CM | POA: Insufficient documentation

## 2015-12-28 DIAGNOSIS — Z01812 Encounter for preprocedural laboratory examination: Secondary | ICD-10-CM | POA: Diagnosis not present

## 2015-12-28 DIAGNOSIS — I252 Old myocardial infarction: Secondary | ICD-10-CM | POA: Diagnosis not present

## 2015-12-28 DIAGNOSIS — E119 Type 2 diabetes mellitus without complications: Secondary | ICD-10-CM | POA: Insufficient documentation

## 2015-12-28 DIAGNOSIS — M1612 Unilateral primary osteoarthritis, left hip: Secondary | ICD-10-CM | POA: Diagnosis not present

## 2015-12-28 DIAGNOSIS — Z79899 Other long term (current) drug therapy: Secondary | ICD-10-CM | POA: Diagnosis not present

## 2015-12-28 DIAGNOSIS — Z96642 Presence of left artificial hip joint: Secondary | ICD-10-CM | POA: Diagnosis not present

## 2015-12-28 HISTORY — DX: Frequency of micturition: R35.0

## 2015-12-28 HISTORY — DX: Reserved for inherently not codable concepts without codable children: IMO0001

## 2015-12-28 HISTORY — DX: Other amnesia: R41.3

## 2015-12-28 HISTORY — DX: Slurred speech: R47.81

## 2015-12-28 LAB — BASIC METABOLIC PANEL
ANION GAP: 10 (ref 5–15)
BUN: 17 mg/dL (ref 6–20)
CALCIUM: 10.2 mg/dL (ref 8.9–10.3)
CHLORIDE: 104 mmol/L (ref 101–111)
CO2: 27 mmol/L (ref 22–32)
Creatinine, Ser: 1.79 mg/dL — ABNORMAL HIGH (ref 0.61–1.24)
GFR calc Af Amer: 47 mL/min — ABNORMAL LOW (ref >60)
GFR calc non Af Amer: 41 mL/min — ABNORMAL LOW (ref >60)
GLUCOSE: 166 mg/dL — AB (ref 65–99)
Potassium: 4.1 mmol/L (ref 3.5–5.1)
SODIUM: 141 mmol/L (ref 135–145)

## 2015-12-28 LAB — URINALYSIS, ROUTINE W REFLEX MICROSCOPIC
BILIRUBIN URINE: NEGATIVE
Glucose, UA: NEGATIVE mg/dL
Hgb urine dipstick: NEGATIVE
KETONES UR: NEGATIVE mg/dL
LEUKOCYTES UA: NEGATIVE
NITRITE: NEGATIVE
PH: 5.5 (ref 5.0–8.0)
PROTEIN: 100 mg/dL — AB
Specific Gravity, Urine: 1.022 (ref 1.005–1.030)

## 2015-12-28 LAB — TYPE AND SCREEN
ABO/RH(D): O POS
Antibody Screen: NEGATIVE

## 2015-12-28 LAB — SURGICAL PCR SCREEN
MRSA, PCR: NEGATIVE
STAPHYLOCOCCUS AUREUS: NEGATIVE

## 2015-12-28 LAB — CBC
HCT: 43.7 % (ref 39.0–52.0)
HEMOGLOBIN: 14.8 g/dL (ref 13.0–17.0)
MCH: 29.7 pg (ref 26.0–34.0)
MCHC: 33.9 g/dL (ref 30.0–36.0)
MCV: 87.8 fL (ref 78.0–100.0)
Platelets: 263 10*3/uL (ref 150–400)
RBC: 4.98 MIL/uL (ref 4.22–5.81)
RDW: 14.2 % (ref 11.5–15.5)
WBC: 10.6 10*3/uL — ABNORMAL HIGH (ref 4.0–10.5)

## 2015-12-28 LAB — URINE MICROSCOPIC-ADD ON: RBC / HPF: NONE SEEN RBC/hpf (ref 0–5)

## 2015-12-28 LAB — PROTIME-INR
INR: 1.06 (ref 0.00–1.49)
PROTHROMBIN TIME: 14 s (ref 11.6–15.2)

## 2015-12-28 LAB — GLUCOSE, CAPILLARY: GLUCOSE-CAPILLARY: 112 mg/dL — AB (ref 65–99)

## 2015-12-28 NOTE — Progress Notes (Signed)
   12/28/15 1119  OBSTRUCTIVE SLEEP APNEA  Have you ever been diagnosed with sleep apnea through a sleep study? No  Do you snore loudly (loud enough to be heard through closed doors)?  1  Do you often feel tired, fatigued, or sleepy during the daytime (such as falling asleep during driving or talking to someone)? 1  Has anyone observed you stop breathing during your sleep? 1  Do you have, or are you being treated for high blood pressure? 1  BMI more than 35 kg/m2? 0  Age > 50 (1-yes) 1  Neck circumference greater than:Male 16 inches or larger, Male 17inches or larger? 1  Male Gender (Yes=1) 1  Obstructive Sleep Apnea Score 7

## 2015-12-28 NOTE — Pre-Procedure Instructions (Signed)
    Raymond Barrera  12/28/2015     No Pharmacies Listed   Your procedure is scheduled on Tuesday, February 7th, 2017.  Report to [facility] Admitting at 8:15 A.M.  Call this number if you have problems the morning of surgery:  [phone]   Remember:  Do not eat food or drink liquids after midnight.  Take these medicines the morning of surgery with A SIP OF WATER: Diazepam (Valium), Ondansetron (Zofran) if needed, Oxycodone-acetaminophen (Percocet) if needed.   What do I do about my diabetes medications?   Do not take oral diabetes medicines (pills) the morning of surgery.   7 days prior to surgery, stop taking: Aspirin, Celebrex, Fish oil, NSAIDS, Aleve, Naproxen, Ibuprofen, Advil, Motrin, BC's, Goody's, all herbal medications, and all vitamins.    Do not wear jewelry.  Do not wear lotions, powders, or colognes.  You may NOT wear deodorant.  Men may shave face and neck.  Do not bring valuables to the hospital.  [facility] is not responsible for any belongings or valuables.  Contacts, dentures or bridgework may not be worn into surgery.  Leave your suitcase in the car.  After surgery it may be brought to your room.  For patients admitted to the hospital, discharge time will be determined by your treatment team.  Patients discharged the day of surgery will not be allowed to drive home.   Special instructions:  See attached.   Please read over the following fact sheets that you were given. Pain Booklet, Coughing and Deep Breathing, Blood Transfusion Information, Total Joint Packet, MRSA Information and Surgical Site Infection Prevention    How to Manage Your Diabetes Before Surgery   Why is it important to control my blood sugar before and after surgery?   Improving blood sugar levels before and after surgery helps healing and can limit problems.  A way of improving blood sugar control is eating a healthy diet by:  - Eating less sugar and  carbohydrates  - Increasing activity/exercise  - Talk with your doctor about reaching your blood sugar goals  High blood sugars (greater than 180 mg/dL) can raise your risk of infections and slow down your recovery so you will need to focus on controlling your diabetes during the weeks before surgery.  Make sure that the doctor who takes care of your diabetes knows about your planned surgery including the date and location.  How do I manage my blood sugars before surgery?   Check your blood sugar at least 4 times a day, 2 days before surgery to make sure that they are not too high or low.   Check your blood sugar the morning of your surgery when you wake up and every 2 hours until you get to the Short-Stay unit.  If your blood sugar is less than 70 mg/dL, you will need to treat for low blood sugar by:  Treat a low blood sugar (less than 70 mg/dL) with 1/2 cup of clear juice (cranberry or apple), 4 glucose tablets, OR glucose gel.  Recheck blood sugar in 15 minutes after treatment (to make sure it is greater than 70 mg/dL).  If blood sugar is not greater than 70 mg/dL on re-check, call [phone] for further instructions.   Report your blood sugar to the Short-Stay nurse when you get to Short-Stay.  References:  [facility], 2007 "How to Manage your Diabetes Before and After Surgery".

## 2015-12-28 NOTE — Progress Notes (Signed)
PCP - Dr. Lucia Gaskins Cardiologist - denies  EKG - 12/28/15 CXR - denies  Echo/Stress test/Cardiac cath - denies  Patient denies chest pain and shortness of breath at PAT appointment.  Patient states that he does not check his blood sugars at home.

## 2015-12-29 LAB — HEMOGLOBIN A1C
HEMOGLOBIN A1C: 7.3 % — AB (ref 4.8–5.6)
MEAN PLASMA GLUCOSE: 163 mg/dL

## 2016-01-01 NOTE — Progress Notes (Signed)
Anesthesia Chart Review:  Pt is a 55 year old male scheduled for L total hip arthroplasty on 01/08/2016 with Dr. Alain Marion.   PCP is Dr. Lucia Gaskins.   PMH includes:  MI (1987), HTN, DM, hyperlipidemia, stroke (2014), TIA, post-op N/V. Current smoker. BMI 31. S/p L THA 03/20/15. S/p lumbar laminectomy 10/17/14.   Medications include: ASA, benazepril, metformin.  Preoperative labs reviewed.  Glucose 166, hgbA1c 7.3, Cr 1.79, BUN 17. Prior renal function testing normal. Will recheck DOS.   EKG 12/28/15: NSR. Possible Left atrial enlargement. LVH with QRS widening. Prolonged QT   Echo 03/08/13:  - Left ventricle: The cavity size was normal. There was mild concentric hypertrophy. Systolic function was mildly reduced. The estimated ejection fraction was in the range of 45% to 50%. Diffuse hypokinesis. Doppler parameters are consistent with abnormal left ventricular relaxation (grade 1 diastolic dysfunction). - Left atrium: The atrium was mildly dilated. - Right atrium: The atrium was mildly dilated. - Atrial septum: No defect or patent foramen ovale wasidentified.  Carotid duplex 03/08/13: No significant extracranial carotid artery stenosis demonstrated.  Reviewed case with Dr. Kalman Shan.   If renal function acceptable DOS, I anticipate pt can proceed as scheduled.   Willeen Cass, FNP-BC [facility] Short Stay Surgical Center/Anesthesiology Phone: [phone] 01/01/2016 4:56 PM

## 2016-01-07 MED ORDER — CHLORHEXIDINE GLUCONATE 4 % EX LIQD: 60.0000 mL | Freq: Once | CUTANEOUS | Status: DC

## 2016-01-07 MED ORDER — POTASSIUM CHLORIDE IN NACL 20-0.45 MEQ/L-% IV SOLN
INTRAVENOUS | Status: DC
  Filled 2016-01-07: qty 1000

## 2016-01-07 MED ORDER — ACETAMINOPHEN 500 MG PO TABS
1000.0000 mg | ORAL_TABLET | Freq: Once | ORAL | Status: AC
  Administered 2016-01-08: 1000 mg via ORAL
  Filled 2016-01-07: qty 2

## 2016-01-07 MED ORDER — TRANEXAMIC ACID 1000 MG/10ML IV SOLN
2000.0000 mg | INTRAVENOUS | Status: AC
  Administered 2016-01-08: 2000 mg via TOPICAL
  Filled 2016-01-07: qty 20

## 2016-01-07 MED ORDER — CEFAZOLIN SODIUM-DEXTROSE 2-3 GM-% IV SOLR
2.0000 g | INTRAVENOUS | Status: AC
  Administered 2016-01-08: 2 g via INTRAVENOUS
  Filled 2016-01-07: qty 50

## 2016-01-08 ENCOUNTER — Inpatient Hospital Stay (HOSPITAL_COMMUNITY): Payer: Medicaid Other | Admitting: Certified Registered Nurse Anesthetist

## 2016-01-08 ENCOUNTER — Inpatient Hospital Stay (HOSPITAL_COMMUNITY): Payer: Medicaid Other

## 2016-01-08 ENCOUNTER — Encounter (HOSPITAL_COMMUNITY)
Admission: RE | Disposition: A | Discharge status: Home Health | Payer: Self-pay | Source: Ambulatory Visit | Attending: Orthopedic Surgery

## 2016-01-08 ENCOUNTER — Encounter (HOSPITAL_COMMUNITY): Payer: Self-pay | Admitting: Certified Registered Nurse Anesthetist

## 2016-01-08 ENCOUNTER — Inpatient Hospital Stay (HOSPITAL_COMMUNITY): Payer: Medicaid Other | Admitting: Emergency Medicine

## 2016-01-08 ENCOUNTER — Inpatient Hospital Stay (HOSPITAL_COMMUNITY)
Admission: RE | Admit: 2016-01-08 | Discharge: 2016-01-10 | DRG: 470 | Disposition: A | Discharge status: Home Health | Payer: Medicaid Other | Source: Ambulatory Visit | Attending: Orthopedic Surgery | Admitting: Orthopedic Surgery

## 2016-01-08 DIAGNOSIS — F17210 Nicotine dependence, cigarettes, uncomplicated: Secondary | ICD-10-CM | POA: Diagnosis present

## 2016-01-08 DIAGNOSIS — Z419 Encounter for procedure for purposes other than remedying health state, unspecified: Secondary | ICD-10-CM

## 2016-01-08 DIAGNOSIS — Z8673 Personal history of transient ischemic attack (TIA), and cerebral infarction without residual deficits: Secondary | ICD-10-CM

## 2016-01-08 DIAGNOSIS — I252 Old myocardial infarction: Secondary | ICD-10-CM | POA: Diagnosis not present

## 2016-01-08 DIAGNOSIS — E119 Type 2 diabetes mellitus without complications: Secondary | ICD-10-CM | POA: Diagnosis present

## 2016-01-08 DIAGNOSIS — E785 Hyperlipidemia, unspecified: Secondary | ICD-10-CM | POA: Diagnosis present

## 2016-01-08 DIAGNOSIS — I10 Essential (primary) hypertension: Secondary | ICD-10-CM | POA: Diagnosis present

## 2016-01-08 DIAGNOSIS — Z96641 Presence of right artificial hip joint: Secondary | ICD-10-CM | POA: Diagnosis not present

## 2016-01-08 DIAGNOSIS — M1612 Unilateral primary osteoarthritis, left hip: Secondary | ICD-10-CM | POA: Diagnosis not present

## 2016-01-08 DIAGNOSIS — F419 Anxiety disorder, unspecified: Secondary | ICD-10-CM | POA: Diagnosis present

## 2016-01-08 DIAGNOSIS — M25552 Pain in left hip: Secondary | ICD-10-CM | POA: Diagnosis present

## 2016-01-08 DIAGNOSIS — F17200 Nicotine dependence, unspecified, uncomplicated: Secondary | ICD-10-CM | POA: Diagnosis present

## 2016-01-08 DIAGNOSIS — Z96649 Presence of unspecified artificial hip joint: Secondary | ICD-10-CM

## 2016-01-08 HISTORY — PX: TOTAL HIP ARTHROPLASTY: SHX124

## 2016-01-08 LAB — POCT I-STAT 4, (NA,K, GLUC, HGB,HCT)
Glucose, Bld: 137 mg/dL — ABNORMAL HIGH (ref 65–99)
HCT: 43 % (ref 39.0–52.0)
Hemoglobin: 14.6 g/dL (ref 13.0–17.0)
Potassium: 4.3 mmol/L (ref 3.5–5.1)
Sodium: 142 mmol/L (ref 135–145)

## 2016-01-08 LAB — POCT I-STAT, CHEM 8
BUN: 23 mg/dL — AB (ref 6–20)
CHLORIDE: 107 mmol/L (ref 101–111)
CREATININE: 1.1 mg/dL (ref 0.61–1.24)
Calcium, Ion: 1.15 mmol/L (ref 1.12–1.23)
Glucose, Bld: 137 mg/dL — ABNORMAL HIGH (ref 65–99)
HEMATOCRIT: 46 % (ref 39.0–52.0)
Hemoglobin: 15.6 g/dL (ref 13.0–17.0)
POTASSIUM: 4.4 mmol/L (ref 3.5–5.1)
SODIUM: 142 mmol/L (ref 135–145)
TCO2: 28 mmol/L (ref 0–100)

## 2016-01-08 LAB — GLUCOSE, CAPILLARY
Glucose-Capillary: 127 mg/dL — ABNORMAL HIGH (ref 65–99)
Glucose-Capillary: 180 mg/dL — ABNORMAL HIGH (ref 65–99)

## 2016-01-08 SURGERY — ARTHROPLASTY, HIP, TOTAL, ANTERIOR APPROACH
Anesthesia: General | Laterality: Left

## 2016-01-08 MED ORDER — MIDAZOLAM HCL 2 MG/2ML IJ SOLN
INTRAMUSCULAR | Status: DC | PRN
Start: 2016-01-08 — End: 2016-01-08
  Administered 2016-01-08 (×2): 1 mg via INTRAVENOUS

## 2016-01-08 MED ORDER — PHENYLEPHRINE HCL 10 MG/ML IJ SOLN
10.0000 mg | INTRAVENOUS | Status: DC | PRN
  Administered 2016-01-08: 100 ug/min via INTRAVENOUS

## 2016-01-08 MED ORDER — LIDOCAINE HCL (CARDIAC) 20 MG/ML IV SOLN
INTRAVENOUS | Status: DC | PRN
  Administered 2016-01-08: 60 mg via INTRATRACHEAL

## 2016-01-08 MED ORDER — HYDROMORPHONE HCL 1 MG/ML IJ SOLN
1.0000 mg | INTRAMUSCULAR | Status: DC | PRN
  Administered 2016-01-08 – 2016-01-09 (×3): 1 mg via INTRAVENOUS
  Filled 2016-01-08 (×2): qty 1

## 2016-01-08 MED ORDER — OXYCODONE-ACETAMINOPHEN 10-325 MG PO TABS: 1.0000 | ORAL_TABLET | ORAL | Status: DC | PRN

## 2016-01-08 MED ORDER — HYDROMORPHONE HCL 1 MG/ML IJ SOLN
INTRAMUSCULAR | Status: AC
  Administered 2016-01-08: 0.5 mg via INTRAVENOUS
  Filled 2016-01-08: qty 1

## 2016-01-08 MED ORDER — ACETAMINOPHEN 325 MG PO TABS: 650.0000 mg | ORAL_TABLET | Freq: Four times a day (QID) | ORAL | Status: DC | PRN

## 2016-01-08 MED ORDER — BUPIVACAINE HCL (PF) 0.25 % IJ SOLN
INTRAMUSCULAR | Status: AC
  Filled 2016-01-08: qty 30

## 2016-01-08 MED ORDER — ROCURONIUM BROMIDE 100 MG/10ML IV SOLN
INTRAVENOUS | Status: DC | PRN
  Administered 2016-01-08: 50 mg via INTRAVENOUS

## 2016-01-08 MED ORDER — FENTANYL CITRATE (PF) 250 MCG/5ML IJ SOLN
INTRAMUSCULAR | Status: DC | PRN
  Administered 2016-01-08: 50 ug via INTRAVENOUS
  Administered 2016-01-08 (×2): 100 ug via INTRAVENOUS
  Administered 2016-01-08 (×2): 50 ug via INTRAVENOUS

## 2016-01-08 MED ORDER — LIDOCAINE HCL (CARDIAC) 20 MG/ML IV SOLN
INTRAVENOUS | Status: AC
  Filled 2016-01-08: qty 5

## 2016-01-08 MED ORDER — SODIUM CHLORIDE FLUSH 0.9 % IV SOLN
INTRAVENOUS | Status: DC | PRN
  Administered 2016-01-08: 19 mL via INTRAVENOUS

## 2016-01-08 MED ORDER — ROCURONIUM BROMIDE 50 MG/5ML IV SOLN
INTRAVENOUS | Status: AC
  Filled 2016-01-08: qty 1

## 2016-01-08 MED ORDER — PROPOFOL 10 MG/ML IV BOLUS
INTRAVENOUS | Status: AC
  Filled 2016-01-08: qty 20

## 2016-01-08 MED ORDER — LABETALOL HCL 5 MG/ML IV SOLN
10.0000 mg | INTRAVENOUS | Status: AC | PRN
  Administered 2016-01-08 (×3): 10 mg via INTRAVENOUS

## 2016-01-08 MED ORDER — EPHEDRINE SULFATE 50 MG/ML IJ SOLN
INTRAMUSCULAR | Status: DC | PRN
  Administered 2016-01-08 (×2): 10 mg via INTRAVENOUS

## 2016-01-08 MED ORDER — SUCCINYLCHOLINE CHLORIDE 20 MG/ML IJ SOLN
INTRAMUSCULAR | Status: AC
Start: 2016-01-08 — End: 2016-01-08
  Filled 2016-01-08: qty 1

## 2016-01-08 MED ORDER — LACTATED RINGERS IV SOLN
INTRAVENOUS | Status: DC
  Administered 2016-01-08 (×3): via INTRAVENOUS

## 2016-01-08 MED ORDER — DOCUSATE SODIUM 100 MG PO CAPS
100.0000 mg | ORAL_CAPSULE | Freq: Two times a day (BID) | ORAL | Status: DC
  Administered 2016-01-08 – 2016-01-10 (×4): 100 mg via ORAL
  Filled 2016-01-08 (×4): qty 1

## 2016-01-08 MED ORDER — DOCUSATE SODIUM 100 MG PO CAPS: 100.0000 mg | ORAL_CAPSULE | Freq: Two times a day (BID) | ORAL | Status: DC

## 2016-01-08 MED ORDER — METOPROLOL TARTRATE 1 MG/ML IV SOLN
INTRAVENOUS | Status: AC
  Filled 2016-01-08: qty 5

## 2016-01-08 MED ORDER — KETOROLAC TROMETHAMINE 30 MG/ML IJ SOLN
INTRAMUSCULAR | Status: DC | PRN
  Administered 2016-01-08: 30 mg via INTRAVENOUS

## 2016-01-08 MED ORDER — POLYETHYLENE GLYCOL 3350 17 G PO PACK: 17.0000 g | PACK | Freq: Every day | ORAL | Status: DC | PRN

## 2016-01-08 MED ORDER — CEFAZOLIN SODIUM-DEXTROSE 2-3 GM-% IV SOLR
2.0000 g | Freq: Four times a day (QID) | INTRAVENOUS | Status: AC
  Administered 2016-01-08 – 2016-01-09 (×2): 2 g via INTRAVENOUS
  Filled 2016-01-08 (×2): qty 50

## 2016-01-08 MED ORDER — OXYCODONE HCL 5 MG/5ML PO SOLN: 5.0000 mg | Freq: Once | ORAL | Status: AC | PRN

## 2016-01-08 MED ORDER — BENAZEPRIL HCL 20 MG PO TABS
20.0000 mg | ORAL_TABLET | Freq: Every day | ORAL | Status: DC
  Administered 2016-01-08 – 2016-01-10 (×3): 20 mg via ORAL
  Filled 2016-01-08 (×3): qty 1

## 2016-01-08 MED ORDER — FENTANYL CITRATE (PF) 250 MCG/5ML IJ SOLN
INTRAMUSCULAR | Status: AC
  Filled 2016-01-08: qty 5

## 2016-01-08 MED ORDER — METOCLOPRAMIDE HCL 5 MG/ML IJ SOLN: 5.0000 mg | Freq: Three times a day (TID) | INTRAMUSCULAR | Status: DC | PRN

## 2016-01-08 MED ORDER — MIDAZOLAM HCL 2 MG/2ML IJ SOLN
INTRAMUSCULAR | Status: AC
  Filled 2016-01-08: qty 2

## 2016-01-08 MED ORDER — SUGAMMADEX SODIUM 500 MG/5ML IV SOLN
INTRAVENOUS | Status: DC | PRN
  Administered 2016-01-08: 500 mg via INTRAVENOUS

## 2016-01-08 MED ORDER — BUPIVACAINE HCL (PF) 0.25 % IJ SOLN
INTRAMUSCULAR | Status: DC | PRN
  Administered 2016-01-08: 30 mL

## 2016-01-08 MED ORDER — LABETALOL HCL 5 MG/ML IV SOLN
INTRAVENOUS | Status: AC
  Administered 2016-01-08: 10 mg via INTRAVENOUS
  Filled 2016-01-08: qty 4

## 2016-01-08 MED ORDER — SCOPOLAMINE 1 MG/3DAYS TD PT72
MEDICATED_PATCH | TRANSDERMAL | Status: AC
  Filled 2016-01-08: qty 1

## 2016-01-08 MED ORDER — HYDROMORPHONE HCL 1 MG/ML IJ SOLN
INTRAMUSCULAR | Status: AC
  Filled 2016-01-08: qty 1

## 2016-01-08 MED ORDER — ONDANSETRON HCL 4 MG/2ML IJ SOLN
4.0000 mg | Freq: Four times a day (QID) | INTRAMUSCULAR | Status: AC | PRN
  Administered 2016-01-08: 4 mg via INTRAVENOUS

## 2016-01-08 MED ORDER — DEXAMETHASONE SODIUM PHOSPHATE 10 MG/ML IJ SOLN
10.0000 mg | Freq: Once | INTRAMUSCULAR | Status: AC
  Administered 2016-01-09: 10 mg via INTRAVENOUS
  Filled 2016-01-08: qty 1

## 2016-01-08 MED ORDER — KETOROLAC TROMETHAMINE 30 MG/ML IJ SOLN
INTRAMUSCULAR | Status: AC
  Filled 2016-01-08: qty 1

## 2016-01-08 MED ORDER — METOCLOPRAMIDE HCL 5 MG PO TABS: 5.0000 mg | ORAL_TABLET | Freq: Three times a day (TID) | ORAL | Status: DC | PRN

## 2016-01-08 MED ORDER — PHENYLEPHRINE 40 MCG/ML (10ML) SYRINGE FOR IV PUSH (FOR BLOOD PRESSURE SUPPORT)
PREFILLED_SYRINGE | INTRAVENOUS | Status: AC
  Filled 2016-01-08: qty 10

## 2016-01-08 MED ORDER — METOPROLOL TARTRATE 1 MG/ML IV SOLN
INTRAVENOUS | Status: DC | PRN
  Administered 2016-01-08: .5 mg via INTRAVENOUS
  Administered 2016-01-08: 1 mg via INTRAVENOUS

## 2016-01-08 MED ORDER — EPHEDRINE SULFATE 50 MG/ML IJ SOLN
INTRAMUSCULAR | Status: AC
  Filled 2016-01-08: qty 1

## 2016-01-08 MED ORDER — 0.9 % SODIUM CHLORIDE (POUR BTL) OPTIME
TOPICAL | Status: DC | PRN
  Administered 2016-01-08: 1000 mL

## 2016-01-08 MED ORDER — HYDROMORPHONE HCL 1 MG/ML IJ SOLN
0.2500 mg | INTRAMUSCULAR | Status: DC | PRN
  Administered 2016-01-08 (×4): 0.5 mg via INTRAVENOUS

## 2016-01-08 MED ORDER — ONDANSETRON HCL 4 MG PO TABS: 4.0000 mg | ORAL_TABLET | Freq: Three times a day (TID) | ORAL | Status: DC | PRN

## 2016-01-08 MED ORDER — PROPOFOL 10 MG/ML IV BOLUS
INTRAVENOUS | Status: DC | PRN
  Administered 2016-01-08: 200 mg via INTRAVENOUS

## 2016-01-08 MED ORDER — ARTIFICIAL TEARS OP OINT
TOPICAL_OINTMENT | OPHTHALMIC | Status: AC
  Filled 2016-01-08: qty 3.5

## 2016-01-08 MED ORDER — ONDANSETRON HCL 4 MG PO TABS: 4.0000 mg | ORAL_TABLET | Freq: Four times a day (QID) | ORAL | Status: DC | PRN

## 2016-01-08 MED ORDER — ONDANSETRON HCL 4 MG/2ML IJ SOLN: 4.0000 mg | Freq: Four times a day (QID) | INTRAMUSCULAR | Status: DC | PRN

## 2016-01-08 MED ORDER — ARTIFICIAL TEARS OP OINT
TOPICAL_OINTMENT | OPHTHALMIC | Status: DC | PRN
Start: 2016-01-08 — End: 2016-01-08
  Administered 2016-01-08: 1 via OPHTHALMIC

## 2016-01-08 MED ORDER — METFORMIN HCL 500 MG PO TABS
500.0000 mg | ORAL_TABLET | Freq: Two times a day (BID) | ORAL | Status: DC
Start: 2016-01-09 — End: 2016-01-10
  Administered 2016-01-09 – 2016-01-10 (×3): 500 mg via ORAL
  Filled 2016-01-08 (×3): qty 1

## 2016-01-08 MED ORDER — PHENYLEPHRINE HCL 10 MG/ML IJ SOLN
INTRAMUSCULAR | Status: DC | PRN
  Administered 2016-01-08: 160 ug via INTRAVENOUS
  Administered 2016-01-08: 120 ug via INTRAVENOUS
  Administered 2016-01-08 (×2): 200 ug via INTRAVENOUS
  Administered 2016-01-08: 120 ug via INTRAVENOUS

## 2016-01-08 MED ORDER — OXYCODONE HCL 5 MG PO TABS
10.0000 mg | ORAL_TABLET | ORAL | Status: DC | PRN
  Administered 2016-01-08 – 2016-01-10 (×8): 10 mg via ORAL
  Filled 2016-01-08 (×9): qty 2

## 2016-01-08 MED ORDER — GLYCOPYRROLATE 0.2 MG/ML IJ SOLN
INTRAMUSCULAR | Status: DC | PRN
  Administered 2016-01-08: 0.1 mg via INTRAVENOUS

## 2016-01-08 MED ORDER — ASPIRIN EC 325 MG PO TBEC
325.0000 mg | DELAYED_RELEASE_TABLET | Freq: Every day | ORAL | Status: DC
  Administered 2016-01-09 – 2016-01-10 (×2): 325 mg via ORAL
  Filled 2016-01-08 (×2): qty 1

## 2016-01-08 MED ORDER — ACETAMINOPHEN 650 MG RE SUPP: 650.0000 mg | Freq: Four times a day (QID) | RECTAL | Status: DC | PRN

## 2016-01-08 MED ORDER — DEXAMETHASONE SODIUM PHOSPHATE 4 MG/ML IJ SOLN
INTRAMUSCULAR | Status: DC | PRN
  Administered 2016-01-08: 4 mg via INTRAVENOUS

## 2016-01-08 MED ORDER — DEXAMETHASONE SODIUM PHOSPHATE 4 MG/ML IJ SOLN
INTRAMUSCULAR | Status: AC
  Filled 2016-01-08: qty 1

## 2016-01-08 MED ORDER — CELECOXIB 200 MG PO CAPS
200.0000 mg | ORAL_CAPSULE | Freq: Two times a day (BID) | ORAL | Status: DC
  Administered 2016-01-08 – 2016-01-10 (×4): 200 mg via ORAL
  Filled 2016-01-08 (×4): qty 1

## 2016-01-08 MED ORDER — ASPIRIN 325 MG PO TABS: 325.0000 mg | ORAL_TABLET | Freq: Every day | ORAL | Status: DC

## 2016-01-08 MED ORDER — ONDANSETRON HCL 4 MG/2ML IJ SOLN
INTRAMUSCULAR | Status: AC
  Filled 2016-01-08: qty 2

## 2016-01-08 MED ORDER — ONDANSETRON HCL 4 MG/2ML IJ SOLN
INTRAMUSCULAR | Status: DC | PRN
  Administered 2016-01-08: 4 mg via INTRAVENOUS

## 2016-01-08 MED ORDER — HYDRALAZINE HCL 20 MG/ML IJ SOLN
10.0000 mg | Freq: Once | INTRAMUSCULAR | Status: AC
  Administered 2016-01-08: 10 mg via INTRAVENOUS

## 2016-01-08 MED ORDER — OXYCODONE HCL 5 MG PO TABS
ORAL_TABLET | ORAL | Status: AC
  Administered 2016-01-08: 5 mg via ORAL
  Filled 2016-01-08: qty 1

## 2016-01-08 MED ORDER — PHENOL 1.4 % MT LIQD: 1.0000 | OROMUCOSAL | Status: DC | PRN

## 2016-01-08 MED ORDER — DIAZEPAM 5 MG PO TABS
10.0000 mg | ORAL_TABLET | Freq: Four times a day (QID) | ORAL | Status: DC | PRN
Start: 2016-01-08 — End: 2016-01-10
  Administered 2016-01-08 – 2016-01-10 (×3): 10 mg via ORAL
  Filled 2016-01-08 (×3): qty 2

## 2016-01-08 MED ORDER — GLYCOPYRROLATE 0.2 MG/ML IJ SOLN
INTRAMUSCULAR | Status: AC
  Filled 2016-01-08: qty 1

## 2016-01-08 MED ORDER — MENTHOL 3 MG MT LOZG
1.0000 | LOZENGE | OROMUCOSAL | Status: DC | PRN
Start: 2016-01-08 — End: 2016-01-10

## 2016-01-08 MED ORDER — DIAZEPAM 10 MG PO TABS: 10.0000 mg | ORAL_TABLET | Freq: Four times a day (QID) | ORAL | Status: DC | PRN

## 2016-01-08 MED ORDER — SCOPOLAMINE 1 MG/3DAYS TD PT72
1.0000 | MEDICATED_PATCH | TRANSDERMAL | Status: DC
  Administered 2016-01-08: 1.5 mg via TRANSDERMAL

## 2016-01-08 MED ORDER — POTASSIUM CHLORIDE IN NACL 20-0.45 MEQ/L-% IV SOLN
INTRAVENOUS | Status: DC
  Administered 2016-01-08: 75 mL/h via INTRAVENOUS
  Filled 2016-01-08 (×2): qty 1000

## 2016-01-08 MED ORDER — OXYCODONE HCL 5 MG PO TABS
5.0000 mg | ORAL_TABLET | Freq: Once | ORAL | Status: AC | PRN
  Administered 2016-01-08: 5 mg via ORAL

## 2016-01-08 MED ORDER — HYDRALAZINE HCL 20 MG/ML IJ SOLN
INTRAMUSCULAR | Status: AC
  Filled 2016-01-08: qty 1

## 2016-01-08 MED ORDER — PROPOFOL 500 MG/50ML IV EMUL
INTRAVENOUS | Status: DC | PRN
  Administered 2016-01-08: 10 ug/kg/min via INTRAVENOUS

## 2016-01-08 MED ORDER — SUCCINYLCHOLINE CHLORIDE 20 MG/ML IJ SOLN
INTRAMUSCULAR | Status: DC | PRN
  Administered 2016-01-08: 140 mg via INTRAVENOUS

## 2016-01-08 MED ORDER — SUGAMMADEX SODIUM 500 MG/5ML IV SOLN
INTRAVENOUS | Status: AC
  Filled 2016-01-08: qty 5

## 2016-01-08 SURGICAL SUPPLY — 61 items
BAG DECANTER FOR FLEXI CONT (MISCELLANEOUS) IMPLANT
BLADE SAG 18X100X1.27 (BLADE) IMPLANT
BLADE SAW SGTL 18X1.27X75 (BLADE) IMPLANT
BLADE SURG ROTATE 9660 (MISCELLANEOUS) IMPLANT
CAPT HIP TOTAL 2 ×1 IMPLANT
CLSR STERI-STRIP ANTIMIC 1/2X4 (GAUZE/BANDAGES/DRESSINGS) ×2 IMPLANT
COVER PERINEAL POST (MISCELLANEOUS) ×2 IMPLANT
COVER SURGICAL LIGHT HANDLE (MISCELLANEOUS) ×2 IMPLANT
DRAPE C-ARM 42X72 X-RAY (DRAPES) IMPLANT
DRAPE STERI IOBAN 125X83 (DRAPES) ×2 IMPLANT
DRAPE U-SHAPE 47X51 STRL (DRAPES) ×4 IMPLANT
DRSG MEPILEX BORDER 4X8 (GAUZE/BANDAGES/DRESSINGS) ×2 IMPLANT
DURAPREP 26ML APPLICATOR (WOUND CARE) ×2 IMPLANT
ELECT BLADE 4.0 EZ CLEAN MEGAD (MISCELLANEOUS) ×2
ELECT REM PT RETURN 9FT ADLT (ELECTROSURGICAL) ×2
ELECTRODE BLDE 4.0 EZ CLN MEGD (MISCELLANEOUS) ×1 IMPLANT
ELECTRODE REM PT RTRN 9FT ADLT (ELECTROSURGICAL) ×1 IMPLANT
FACESHIELD WRAPAROUND (MASK) ×4 IMPLANT
FACESHIELD WRAPAROUND OR TEAM (MASK) ×2 IMPLANT
GLOVE BIO SURGEON STRL SZ7 (GLOVE) ×2 IMPLANT
GLOVE BIO SURGEON STRL SZ7.5 (GLOVE) ×2 IMPLANT
GLOVE BIOGEL PI IND STRL 7.0 (GLOVE) ×1 IMPLANT
GLOVE BIOGEL PI IND STRL 7.5 (GLOVE) IMPLANT
GLOVE BIOGEL PI IND STRL 8 (GLOVE) ×1 IMPLANT
GLOVE BIOGEL PI INDICATOR 7.0 (GLOVE) ×1
GLOVE BIOGEL PI INDICATOR 7.5 (GLOVE) ×1
GLOVE BIOGEL PI INDICATOR 8 (GLOVE) ×2
GLOVE BIOGEL PI ORTHO PRO 7.5 (GLOVE) ×1
GLOVE BIOGEL PI ORTHO PRO SZ8 (GLOVE) ×1
GLOVE PI ORTHO PRO STRL 7.5 (GLOVE) IMPLANT
GLOVE PI ORTHO PRO STRL SZ8 (GLOVE) IMPLANT
GOWN STRL REUS W/ TWL LRG LVL3 (GOWN DISPOSABLE) ×2 IMPLANT
GOWN STRL REUS W/ TWL XL LVL3 (GOWN DISPOSABLE) ×1 IMPLANT
GOWN STRL REUS W/TWL LRG LVL3 (GOWN DISPOSABLE) ×4
GOWN STRL REUS W/TWL XL LVL3 (GOWN DISPOSABLE) ×2
KIT BASIN OR (CUSTOM PROCEDURE TRAY) ×2 IMPLANT
KIT ROOM TURNOVER OR (KITS) ×2 IMPLANT
MANIFOLD NEPTUNE II (INSTRUMENTS) ×2 IMPLANT
NDL 18GX1X1/2 (RX/OR ONLY) (NEEDLE) IMPLANT
NDL SAFETY ECLIPSE 18X1.5 (NEEDLE) ×1 IMPLANT
NEEDLE 18GX1X1/2 (RX/OR ONLY) (NEEDLE) IMPLANT
NEEDLE HYPO 18GX1.5 SHARP (NEEDLE) ×2
NS IRRIG 1000ML POUR BTL (IV SOLUTION) ×2 IMPLANT
PACK TOTAL JOINT (CUSTOM PROCEDURE TRAY) ×2 IMPLANT
PACK UNIVERSAL I (CUSTOM PROCEDURE TRAY) ×2 IMPLANT
PAD ARMBOARD 7.5X6 YLW CONV (MISCELLANEOUS) ×2 IMPLANT
SPONGE LAP 18X18 X RAY DECT (DISPOSABLE) IMPLANT
STRIP CLOSURE SKIN 1/2X4 (GAUZE/BANDAGES/DRESSINGS) ×1 IMPLANT
SUT MNCRL AB 4-0 PS2 18 (SUTURE) ×2 IMPLANT
SUT MON AB 2-0 CT1 36 (SUTURE) ×2 IMPLANT
SUT VIC AB 0 CT1 27 (SUTURE) ×2
SUT VIC AB 0 CT1 27XBRD ANBCTR (SUTURE) ×1 IMPLANT
SUT VIC AB 1 CT1 27 (SUTURE) ×2
SUT VIC AB 1 CT1 27XBRD ANBCTR (SUTURE) ×1 IMPLANT
SYR 50ML LL SCALE MARK (SYRINGE) ×2 IMPLANT
SYRINGE 20CC LL (MISCELLANEOUS) IMPLANT
TOWEL OR 17X24 6PK STRL BLUE (TOWEL DISPOSABLE) ×2 IMPLANT
TOWEL OR 17X26 10 PK STRL BLUE (TOWEL DISPOSABLE) ×2 IMPLANT
TRAY FOLEY CATH 16FRSI W/METER (SET/KITS/TRAYS/PACK) IMPLANT
WATER STERILE IRR 1000ML POUR (IV SOLUTION) ×2 IMPLANT
YANKAUER SUCT BULB TIP NO VENT (SUCTIONS) ×2 IMPLANT

## 2016-01-08 NOTE — Interval H&P Note (Signed)
History and Physical Interval Note:  01/08/2016 7:25 AM  Raymond Barrera  has presented today for surgery, with the diagnosis of OA LEFT HIP  The various methods of treatment have been discussed with the patient and family. After consideration of risks, benefits and other options for treatment, the patient has consented to  Procedure(s): TOTAL HIP ARTHROPLASTY ANTERIOR APPROACH (Left) as a surgical intervention .  The patient's history has been reviewed, patient examined, no change in status, stable for surgery.  I have reviewed the patient's chart and labs.  Questions were answered to the patient's satisfaction.     Dayne Dekay D

## 2016-01-08 NOTE — Op Note (Addendum)
01/08/2016  11:35 AM  PATIENT:  Raymond Barrera   MRN: [id number]  PRE-OPERATIVE DIAGNOSIS:  OA LEFT HIP  POST-OPERATIVE DIAGNOSIS:  OA LEFT HIP  PROCEDURE:  Procedure(s): TOTAL HIP ARTHROPLASTY ANTERIOR APPROACH  PREOPERATIVE INDICATIONS:    Raymond Barrera is an 55 y.o. male who has a diagnosis of <principal problem not specified> and elected for surgical management after failing conservative treatment.  The risks benefits and alternatives were discussed with the patient including but not limited to the risks of nonoperative treatment, versus surgical intervention including infection, bleeding, nerve injury, periprosthetic fracture, the need for revision surgery, dislocation, leg length discrepancy, blood clots, cardiopulmonary complications, morbidity, mortality, among others, and they were willing to proceed.     OPERATIVE REPORT     SURGEON:   MURPHY, Jewel Baize, MD    ASSISTANT:  Janalee Dane, PA-C, She was present and scrubbed throughout the case, critical for completion in a timely fashion, and for retraction, instrumentation, and closure.     ANESTHESIA:  General    COMPLICATIONS:  None.     COMPONENTS:  Stryker acolade fit femur size 7 with a 36 mm +0 head ball and a PSL acetabular shell size 58 with a  polyethylene liner    PROCEDURE IN DETAIL:   The patient was met in the holding area and  identified.  The appropriate hip was identified and marked at the operative site.  The patient was then transported to the OR  and  placed under general anesthesia.  At that point, the patient was  placed in the supine position and  secured to the operating room table and all bony prominences padded. He received pre-operative antibiotics    The operative lower extremity was prepped from the iliac crest to the distal leg.  Sterile draping was performed.  Time out was performed prior to incision.      Skin incision was made just 2 cm lateral to the ASIS  extending in line with the  tensor fascia lata. Electrocautery was used to control all bleeders. I dissected down sharply to the fascia of the tensor fascia lata was confirmed that the muscle fibers beneath were running posteriorly. I then incised the fascia over the superficial tensor fascia lata in line with the incision. The fascia was elevated off the anterior aspect of the muscle the muscle was retracted posteriorly and protected throughout the case. I then used electrocautery to incise the tensor fascia lata fascia control and all bleeders. Immediately visible was the fat over top of the anterior neck and capsule.  I removed the anterior fat from the capsule and elevated the rectus muscle off of the anterior capsule. I then removed a large time of capsule. The retractors were then placed over the anterior acetabulum as well as around the superior and inferior neck.  I then removed a section of the femoral neck and a napkin ring fashion. Then used the power course to remove the femoral head from the acetabulum and thoroughly irrigated the acetabulum. I sized the femoral head.    I then exposed the deep acetabulum, cleared out any tissue including the ligamentum teres.   After adequate visualization, I excised the labrum, and then sequentially reamed.  I placed the trial acetabulum, which seated nicely, and then impacted the real cup into place.  Appropriate version and inclination was confirmed clinically matching their bony anatomy, and also with the use transverse acetabular ligament.  I placed a 30mm screw in the posterior/superio position  with an excellent bite.    I then placed the polyethylene liner in place  I then abducted the leg and released the external rotators from the posterior femur allowing it to be easily delivered up lateral and anterior to the acetabulum for preparation of the femoral canal.    I then prepared the proximal femur using the cookie-cutter and then sequentially reamed and broached.  A trial  broach, neck, and head was utilized, and I reduced the hip and it was found to have excellent stability with functional range of motion..  I then impacted the real femoral prosthesis into place into the appropriate version, slightly anteverted to the normal anatomy, and I impacted the real head ball into place. The hip was then reduced and taken through functional range of motion and found to have excellent stability. Leg lengths were restored.  I then irrigated the hip copiously again with, and repaired the fascia with Vicryl, followed by monocryl for the subcutaneous tissue, Monocryl for the skin, Steri-Strips and sterile gauze. The patient was then awakened and returned to PACU in stable and satisfactory condition. There were no complications.  POST OPERATIVE PLAN: WBAT, DVT px: SCD's/TED and ASA 325  Edmonia Lynch, MD Orthopedic Surgeon [phone]   This note was generated using a template and dragon dictation system. In light of that, I have reviewed the note and all aspects of it are applicable to this case. Any dictation errors are due to the computerized dictation system.

## 2016-01-08 NOTE — Anesthesia Postprocedure Evaluation (Signed)
Anesthesia Post Note  Patient: Raymond Barrera  Procedure(s) Performed: Procedure(s) (LRB): TOTAL HIP ARTHROPLASTY ANTERIOR APPROACH (Left)  Patient location during evaluation: PACU Anesthesia Type: General Level of consciousness: awake and alert and patient cooperative Pain management: pain level controlled Vital Signs Assessment: post-procedure vital signs reviewed and stable Respiratory status: spontaneous breathing and respiratory function stable Cardiovascular status: stable Anesthetic complications: no    Last Vitals:  Filed Vitals:   01/08/16 1450 01/08/16 1505  BP: 158/109 138/104  Pulse: 88 80  Temp:    Resp: 12 11    Last Pain:  Filed Vitals:   01/08/16 1523  PainSc: [place]

## 2016-01-08 NOTE — Anesthesia Procedure Notes (Signed)
Procedure Name: Intubation Date/Time: 01/08/2016 10:31 AM Performed by: Collier Bullock Pre-anesthesia Checklist: Patient identified, Emergency Drugs available, Suction available, Patient being monitored and Timeout performed Patient Re-evaluated:Patient Re-evaluated prior to inductionOxygen Delivery Method: Circle system utilized Preoxygenation: Pre-oxygenation with 100% oxygen Intubation Type: IV induction Ventilation: Mask ventilation without difficulty Laryngoscope Size: Mac and 4 Grade View: Grade II Tube type: Oral Tube size: 7.5 mm Number of attempts: 1 Airway Equipment and Method: Stylet Secured at: 25 cm Tube secured with: Tape Dental Injury: Teeth and Oropharynx as per pre-operative assessment

## 2016-01-08 NOTE — Transfer of Care (Signed)
Immediate Anesthesia Transfer of Care Note  Patient: Raymond Barrera  Procedure(s) Performed: Procedure(s): TOTAL HIP ARTHROPLASTY ANTERIOR APPROACH (Left)  Patient Location: PACU  Anesthesia Type:General  Level of Consciousness: awake, alert , oriented and patient cooperative  Airway & Oxygen Therapy: Patient Spontanous Breathing and Patient connected to face mask oxygen  Post-op Assessment: Report given to RN, Post -op Vital signs reviewed and stable, Patient moving all extremities X 4 and Patient able to stick tongue midline  Post vital signs: Reviewed and stable  Last Vitals:  Filed Vitals:   01/08/16 0831 01/08/16 0908  BP: 161/109 142/114  Pulse: 88   Temp: [id number] C     Complications: No apparent anesthesia complications

## 2016-01-08 NOTE — Progress Notes (Signed)
Dr. Marcie Bal into see pt., discussed anesth. Plan.  Order rec'd to use Scopolamine patch.  Dr. Marcie Bal aware of BP this a.m.

## 2016-01-08 NOTE — Anesthesia Preprocedure Evaluation (Addendum)
Anesthesia Evaluation  Patient identified by MRN, date of birth, ID band Patient awake    Reviewed: Allergy & Precautions, NPO status , Patient's Chart, lab work & pertinent test results  History of Anesthesia Complications (+) PONV  Airway Mallampati: II   Neck ROM: full    Dental   Pulmonary shortness of breath, Current Smoker,    breath sounds clear to auscultation       Cardiovascular hypertension, + Past MI   Rhythm:regular Rate:Normal     Neuro/Psych  Headaches, Anxiety TIACVA    GI/Hepatic   Endo/Other  diabetes, Type 2  Renal/GU      Musculoskeletal  (+) Arthritis ,   Abdominal   Peds  Hematology   Anesthesia Other Findings   Reproductive/Obstetrics                            Anesthesia Physical Anesthesia Plan  ASA: III  Anesthesia Plan: General   Post-op Pain Management:    Induction: Intravenous  Airway Management Planned: Oral ETT  Additional Equipment:   Intra-op Plan:   Post-operative Plan: Extubation in OR  Informed Consent: I have reviewed the patients History and Physical, chart, labs and discussed the procedure including the risks, benefits and alternatives for the proposed anesthesia with the patient or authorized representative who has indicated his/her understanding and acceptance.     Plan Discussed with: CRNA, Anesthesiologist and Surgeon  Anesthesia Plan Comments:         Anesthesia Quick Evaluation

## 2016-01-08 NOTE — Discharge Instructions (Signed)
INSTRUCTIONS AFTER JOINT REPLACEMENT  ° °o Remove items at home which could result in a fall. This includes throw rugs or furniture in walking pathways °o ICE to the affected joint every three hours while awake for 30 minutes at a time, for at least the first 3-5 days, and then as needed for pain and swelling.  Continue to use ice for pain and swelling. You may notice swelling that will progress down to the foot and ankle.  This is normal after surgery.  Elevate your leg when you are not up walking on it.   °o Continue to use the breathing machine you got in the hospital (incentive spirometer) which will help keep your temperature down.  It is common for your temperature to cycle up and down following surgery, especially at night when you are not up moving around and exerting yourself.  The breathing machine keeps your lungs expanded and your temperature down. ° ° °DIET:  As you were doing prior to hospitalization, we recommend a well-balanced diet. ° °DRESSING / WOUND CARE / SHOWERING ° °Keep the surgical dressing until follow up.  IF THE DRESSING FALLS OFF or the wound gets wet inside, change the dressing with sterile gauze.  Please use good hand washing techniques before changing the dressing.  Do not use any lotions or creams on the incision until instructed by your surgeon.   ° °ACTIVITY ° °o Increase activity slowly as tolerated, but follow the weight bearing instructions below.   °o No driving for 6 weeks or until further direction given by your physician.  You cannot drive while taking narcotics.  °o No lifting or carrying greater than 10 lbs. until further directed by your surgeon. °o Avoid periods of inactivity such as sitting longer than an hour when not asleep. This helps prevent blood clots.  °o You may return to work once you are authorized by your doctor.  ° ° °WEIGHT BEARING  ° °Weight bearing as tolerated with assist device (walker, cane, etc) as directed, use it as long as suggested by your surgeon  or therapist, typically at least 4-6 weeks. ° ° °CONSTIPATION ° °Constipation is defined medically as fewer than three stools per week and severe constipation as less than one stool per week.  Even if you have a regular bowel pattern at home, your normal regimen is likely to be disrupted due to multiple reasons following surgery.  Combination of anesthesia, postoperative narcotics, change in appetite and fluid intake all can affect your bowels.  ° °YOU MUST use at least one of the following options; they are listed in order of increasing strength to get the job done.  They are all available over the counter, and you may need to use some, POSSIBLY even all of these options:   ° °Drink plenty of fluids (prune juice may be helpful) and high fiber foods °Colace 100 mg by mouth twice a day  °Senokot for constipation as directed and as needed Dulcolax (bisacodyl), take with full glass of water  °Miralax (polyethylene glycol) once or twice a day as needed. ° °If you have tried all these things and are unable to have a bowel movement in the first 3-4 days after surgery call either your surgeon or your primary doctor.   ° °If you experience loose stools or diarrhea, hold the medications until you stool forms back up.  If your symptoms do not get better within 1 week or if they get worse, check with your doctor.  If   you experience "the worst abdominal pain ever" or develop nausea or vomiting, please contact the office immediately for further recommendations for treatment. ° ° °ITCHING:  If you experience itching with your medications, try taking only a single pain pill, or even half a pain pill at a time.  You can also use Benadryl over the counter for itching or also to help with sleep.  ° °TED HOSE STOCKINGS:  Use stockings on both legs until for at least 2 weeks or as directed by physician office. They may be removed at night for sleeping. ° °MEDICATIONS:  See your medication summary on the “After Visit Summary” that  nursing will review with you.  You may have some home medications which will be placed on hold until you complete the course of blood thinner medication.  It is important for you to complete the blood thinner medication as prescribed. ° °PRECAUTIONS:  If you experience chest pain or shortness of breath - call 911 immediately for transfer to the hospital emergency department.  ° °If you develop a fever greater that 101 F, purulent drainage from wound, increased redness or drainage from wound, foul odor from the wound/dressing, or calf pain - CONTACT YOUR SURGEON.   °                                                °FOLLOW-UP APPOINTMENTS:  If you do not already have a post-op appointment, please call the office for an appointment to be seen by your surgeon.  Guidelines for how soon to be seen are listed in your “After Visit Summary”, but are typically between 1-4 weeks after surgery. ° °MAKE SURE YOU:  °• Understand these instructions.  °• Get help right away if you are not doing well or get worse.  ° ° °Thank you for letting us be a part of your medical care team.  It is a privilege we respect greatly.  We hope these instructions will help you stay on track for a fast and full recovery!  ° °

## 2016-01-09 ENCOUNTER — Encounter (HOSPITAL_COMMUNITY): Payer: Self-pay | Admitting: Orthopedic Surgery

## 2016-01-09 NOTE — Care Management (Signed)
Utilization review completed. Cesar Rogerson, RN Case Manager [phone]. 

## 2016-01-09 NOTE — Evaluation (Signed)
Occupational Therapy Evaluation and Discharge Patient Details Name: Raymond Barrera MRN: [id number] DOB: 1961-11-11 Today's Date: 01/09/2016    History of Present Illness 55 y.o. male now s/p Lt anterior THA. PMH: MI, CVA, diabetes, anxiety, back pain, Rt THA.    Clinical Impression   This 55 yo male admitted and underwent above presents to acute OT with all education completed with pt and family (wife)--pt quite lethargic. No further OT needs we will sign off.    Follow Up Recommendations  No OT follow up    Equipment Recommendations  Tub/shower seat       Precautions / Restrictions Precautions Precautions: Fall Precaution Comments: no hip precautions Restrictions Weight Bearing Restrictions: No LLE Weight Bearing: Weight bearing as tolerated              ADL                                         General ADL Comments: Spoke to wife who stated she A'd him with basic ADLs when he had his right hip replaced 03/2015 and plans on A'ing him again post this left hip sx. She is aware of the most efficient way to help him get dressed. We talked about the use of a tub bench and how is works/adjusts and she states that she feels comfortable managing this without pt practicing.Wife states she is also aware of how 3n1 works due to pt's last sx.               Pertinent Vitals/Pain Pain Assessment: 0-10 Pain Score: 10-Worst pain ever Pain Location: Lt hip Pain Descriptors / Indicators: Aching;Sore Pain Intervention(s): Limited activity within patient's tolerance;Monitored during session     Hand Dominance Right   Extremity/Trunk Assessment Upper Extremity Assessment Upper Extremity Assessment: Overall WFL for tasks assessed     Communication Communication Communication: No difficulties   Cognition Arousal/Alertness: Lethargic;Suspect due to medications Behavior During Therapy: [facility] for tasks assessed/performed Overall Cognitive Status: Within  Functional Limits for tasks assessed                                Home Living Family/patient expects to be discharged to:: Private residence Living Arrangements: Spouse/significant other Available Help at Discharge: Family;Available 24 hours/day Type of Home: House Home Access: Stairs to enter [employer] of Steps: 2 Entrance Stairs-Rails: None Home Layout: One level     Bathroom Shower/Tub: Tub/shower unit (no curtain or door)   Biochemist, clinical: Standard     Home Equipment: Cane - single point;Crutches          Prior Functioning/Environment Level of Independence: Independent        Comments: cane    OT Diagnosis: Generalized weakness         OT Goals(Current goals can be found in the care plan section) Acute Rehab OT Goals Patient Stated Goal: get out of bed and get back home  OT Frequency:                End of Session    Activity Tolerance: Patient limited by lethargy Patient left: in chair;with call bell/phone within reach;with family/visitor present   Time: [id number] OT Time Calculation (min): 20 min Charges:  OT General Charges $OT Visit: 1 Procedure OT Evaluation $OT Eval Low Complexity: 1 Procedure  Rolm Baptise  Harmon Pier [id number] 01/09/2016, 11:12 AM

## 2016-01-09 NOTE — Care Management Note (Signed)
Case Management Note  Patient Details  Name: Raymond Barrera MRN: [id number] Date of Birth: 1960/12/20  Subjective/Objective:        S/p left total hip arthroplasty            Action/Plan: Spoke with patient and his wife about discharge plan. They selected [facility] from the [facility] list of agencies. Contacted [facility] at [facility] and [facility] up [facility]. Patient's wife stated that they obtained a rolling walker, 3N1 and tub bench a year ago through [place] but no longer have them. I explained that Medicaid will not cover same equipment within 32yrs. Patient's wife stated that they are not able to afford equipment. Found patient a charity rolling walker on unit and wife stated that family has a 3N1 patient can use. Patient's wife stated that she will be assisting patient after discharge.     Expected Discharge Date:                  Expected Discharge Plan:  [place]  In-House Referral:  NA  Discharge planning Services  CM Consult  Post Acute Care Choice:  Home Health Choice offered to:  Patient, Spouse  DME Arranged:  N/A DME Agency:  NA  HH Arranged:  PT HH Agency:  [place]  Status of Service:  Completed, signed off  Medicare Important Message Given:    Date Medicare IM Given:    Medicare IM give by:    Date Additional Medicare IM Given:    Additional Medicare Important Message give by:     If discussed at [place] of Stay Meetings, dates discussed:    Additional Comments:  Nila Nephew, RN 01/09/2016, 12:15 PM

## 2016-01-09 NOTE — Progress Notes (Signed)
     Subjective:  POD#1 LATH. Patient reports pain as moderate.  Resting comfortably in bed this morning.  Patient was able to get up to the bathroom last night with assistance. Will see how he mobilizes with PT today.  Anticipate that he will be appropriate for discharge later today.   Objective:   VITALS:   Filed Vitals:   01/08/16 1844 01/08/16 2127 01/09/16 0016 01/09/16 0446  BP: 144/88 147/95 134/81 123/81  Pulse: 88 98 99 100  Temp: 98.7 F (37.1 C) 97.8 F (36.6 C) 98.1 F (36.7 C) 98.2 F (36.8 C)  TempSrc:  Oral Oral Oral  Resp: 20 18 18 18   Height:      Weight:      SpO2: 100% 98% 98% 97%    Neurologically intact ABD soft Neurovascular intact Sensation intact distally Intact pulses distally Dorsiflexion/Plantar flexion intact Incision: dressing C/D/I   Lab Results  Component Value Date   WBC 10.6* 12/28/2015   HGB 15.6 01/08/2016   HCT 46.0 01/08/2016   MCV 87.8 12/28/2015   PLT 263 12/28/2015   BMET    Component Value Date/Time   NA 142 01/08/2016 0936   K 4.4 01/08/2016 0936   CL 107 01/08/2016 0936   CO2 27 12/28/2015 1139   GLUCOSE 137* 01/08/2016 0936   BUN 23* 01/08/2016 0936   CREATININE 1.10 01/08/2016 0936   CALCIUM 10.2 12/28/2015 1139   GFRNONAA 41* 12/28/2015 1139   GFRAA 47* 12/28/2015 1139     Assessment/Plan: 1 Day Post-Op   Active Problems:   Primary osteoarthritis of left hip   Up with therapy WBAT in the LLE ASA 325mg  daily for DVT prophylaxis   Raymond Barrera 01/09/2016, 8:06 AM Cell [phone]

## 2016-01-09 NOTE — Clinical Social Work Note (Signed)
CSW received referral for SNF.  Case discussed with case manager and plan is to discharge home.  CSW to sign off please re-consult if social work needs arise.  Brendan Gruwell R. Jayvyn Haselton, MSW, LCSWA [phone]  

## 2016-01-09 NOTE — Evaluation (Signed)
Physical Therapy Evaluation Patient Details Name: Raymond Barrera MRN: [id number] DOB: May 22, 1961 Today's Date: 01/09/2016   History of Present Illness  55 y.o. male now s/p Lt anterior THA. PMH: MI, CVA, diabetes, anxiety, back pain, Rt THA.   Clinical Impression  Pt is s/p THA resulting in the deficits listed below (see PT Problem List). Pt will benefit from skilled PT to increase their independence and safety with mobility to allow discharge to the venue listed below. At this time the patient was lethargic throughout the session, frequent cues were needed to attend to tasks. The patient's spouse was present and supportive during the session. Anticipating that the patient will ultimately D/C to home with his spouse to assist. He will have to ambulate up/down 2 stairs safety and increase his ambulation distance. Will continue to follow to progress mobility as tolerated.      Follow Up Recommendations Home health PT;Supervision for mobility/OOB    Equipment Recommendations  Rolling walker with 5" wheels    Recommendations for Other Services       Precautions / Restrictions Precautions Precautions: Fall Precaution Comments: no hip precautions Restrictions Weight Bearing Restrictions: Yes LLE Weight Bearing: Weight bearing as tolerated      Mobility  Bed Mobility Overal bed mobility: Needs Assistance Bed Mobility: Supine to Sit     Supine to sit: Mod assist     General bed mobility comments: assist needed with LLE and trunk, HOB elevated and using rail  Transfers Overall transfer level: Needs assistance Equipment used: Rolling walker (2 wheeled) Transfers: Sit to/from Stand Sit to Stand: Min assist         General transfer comment: cues for hand placement  Ambulation/Gait Ambulation/Gait assistance: Min guard Ambulation Distance (Feet): 15 Feet Assistive device: Rolling walker (2 wheeled) Gait Pattern/deviations: Step-to pattern Gait velocity: decreased   General  Gait Details: Slow pattern, patient with mild dizziness while ambulating.   Stairs            Wheelchair Mobility    Modified Rankin (Stroke Patients Only)       Balance Overall balance assessment: Needs assistance Sitting-balance support: No upper extremity supported Sitting balance-Leahy Scale: Fair Sitting balance - Comments: mild instability, possibly due to lethargy   Standing balance support: Bilateral upper extremity supported Standing balance-Leahy Scale: Poor Standing balance comment: using rw                             Pertinent Vitals/Pain Pain Assessment: 0-10 Pain Score: 10-Worst pain ever Pain Location: Lt hip Pain Descriptors / Indicators: Aching;Sore Pain Intervention(s): Limited activity within patient's tolerance;Monitored during session    [place] expects to be discharged to:: Private residence Living Arrangements: Spouse/significant other Available Help at Discharge: Family;Available 24 hours/day Type of Home: House Home Access: Stairs to enter Entrance Stairs-Rails: None Entrance Stairs-Number of Steps: 2 Home Layout: One level Home Equipment: Cane - single point;Crutches      Prior Function Level of Independence: Independent               Hand Dominance        Extremity/Trunk Assessment   Upper Extremity Assessment: Defer to OT evaluation           Lower Extremity Assessment: LLE deficits/detail   LLE Deficits / Details: assist needed from moving LE in bed  Cervical / Trunk Assessment: Normal  Communication   Communication: No difficulties  Cognition Arousal/Alertness: Lethargic;Suspect due  to medications Behavior During Therapy: [facility] for tasks assessed/performed Overall Cognitive Status: Within Functional Limits for tasks assessed                      General Comments      Exercises        Assessment/Plan    PT Assessment Patient needs continued PT services  PT  Diagnosis Difficulty walking;Acute pain   PT Problem List Decreased strength;Decreased activity tolerance;Decreased range of motion;Decreased balance;Decreased mobility;Pain  PT Treatment Interventions DME instruction;Gait training;Stair training;Functional mobility training;Therapeutic activities;Therapeutic exercise;Patient/family education   PT Goals (Current goals can be found in the Care Plan section) Acute Rehab PT Goals Patient Stated Goal: get out of bed and get back home PT Goal Formulation: With patient Time For Goal Achievement: 01/23/16 Potential to Achieve Goals: Good    Frequency 7X/week   Barriers to discharge        Co-evaluation               End of Session Equipment Utilized During Treatment: Gait belt Activity Tolerance: Patient limited by lethargy;Patient tolerated treatment well;Patient limited by fatigue Patient left: in chair;with call bell/phone within reach;with family/visitor present Nurse Communication: Mobility status;Precautions;Weight bearing status         Time: [id number] PT Time Calculation (min) (ACUTE ONLY): 27 min   Charges:   PT Evaluation $PT Eval Moderate Complexity: 1 Procedure PT Treatments $Gait Training: 8-22 mins   PT G Codes:        Cassell Clement, PT, CSCS Pager [phone] Office [phone]  01/09/2016, 10:43 AM

## 2016-01-09 NOTE — Progress Notes (Signed)
Physical Therapy Treatment Patient Details Name: Raymond Barrera MRN: [id number] DOB: Feb 11, 1961 Today's Date: 01/09/2016    History of Present Illness 55 y.o. male now s/p Lt anterior THA. PMH: MI, CVA, diabetes, anxiety, back pain, Rt THA.     PT Comments    Patient improving with mobility during second PT session. During ambulation patient having to sit down due to Lt hip pain. Not appropriate to attempt stairs at this time. PT to follow and progress in anticipation of D/C home with spouse assistance. Will have to attempt stairs prior to D/C .   Follow Up Recommendations  Home health PT;Supervision for mobility/OOB     Equipment Recommendations  Rolling walker with 5" wheels    Recommendations for Other Services       Precautions / Restrictions Precautions Precautions: Fall Precaution Comments: no hip precautions Restrictions Weight Bearing Restrictions: Yes LLE Weight Bearing: Weight bearing as tolerated    Mobility  Bed Mobility Overal bed mobility: Needs Assistance Bed Mobility: Supine to Sit     Supine to sit: Supervision     General bed mobility comments: bed flat, using rail  Transfers Overall transfer level: Needs assistance Equipment used: Rolling walker (2 wheeled) Transfers: Sit to/from Stand Sit to Stand: Min guard         General transfer comment: cues for hand placement  Ambulation/Gait Ambulation/Gait assistance: Min guard Ambulation Distance (Feet): 80 Feet Assistive device: Rolling walker (2 wheeled) Gait Pattern/deviations: Step-through pattern Gait velocity: decreased   General Gait Details: cues for even strides   Stairs            Wheelchair Mobility    Modified Rankin (Stroke Patients Only)       Balance Overall balance assessment: Needs assistance Sitting-balance support: No upper extremity supported Sitting balance-Leahy Scale: Good     Standing balance support: Bilateral upper extremity supported Standing  balance-Leahy Scale: Poor Standing balance comment: using rw                    Cognition Arousal/Alertness: Awake/alert Behavior During Therapy: WFL for tasks assessed/performed Overall Cognitive Status: Within Functional Limits for tasks assessed                      Exercises Total Joint Exercises Ankle Circles/Pumps: AROM;Both;10 reps Quad Sets: Strengthening;Left;10 reps Gluteal Sets: Strengthening;Both;10 reps Heel Slides: AAROM;Left;10 reps Hip ABduction/ADduction: Strengthening;Left;10 reps    General Comments        Pertinent Vitals/Pain Pain Assessment: 0-10 Pain Score: 9  Pain Location: Lt hip Pain Descriptors / Indicators: Aching Pain Intervention(s): Limited activity within patient's tolerance;Monitored during session    Home Living                      Prior Function            PT Goals (current goals can now be found in the care plan section) Acute Rehab PT Goals Patient Stated Goal: get out of bed and get back home PT Goal Formulation: With patient Time For Goal Achievement: 01/23/16 Potential to Achieve Goals: Good Progress towards PT goals: Progressing toward goals    Frequency  7X/week    PT Plan Current plan remains appropriate    Co-evaluation             End of Session Equipment Utilized During Treatment: Gait belt Activity Tolerance: Patient tolerated treatment well;Patient limited by fatigue Patient left: in chair;with call bell/phone within reach;with  family/visitor present     Time: [id number] PT Time Calculation (min) (ACUTE ONLY): 23 min  Charges:  $Gait Training: 8-22 mins $Therapeutic Exercise: 8-22 mins                    G Codes:      Cassell Clement, PT, CSCS Pager [phone] Office [phone]  01/09/2016, 4:02 PM

## 2016-01-10 NOTE — Progress Notes (Signed)
     Subjective:  POD#2 LATH. Patient reports pain as moderate.  Patient did much better mobilizing with PT today.  Appropriate for discharge to home today.   Objective:   VITALS:   Filed Vitals:   01/09/16 1404 01/09/16 2013 01/10/16 0500 01/10/16 0801  BP: 137/97 159/87 129/88 141/95  Pulse: 100 98 102   Temp: 98.2 F (36.8 C) 98 F (36.7 C) 98.1 F (36.7 C)   TempSrc:  Oral Oral   Resp: 18 18 18    Height:      Weight:      SpO2: 93% 97% 97%     Neurologically intact ABD soft Neurovascular intact Sensation intact distally Intact pulses distally Dorsiflexion/Plantar flexion intact Incision: dressing C/D/I   Lab Results  Component Value Date   WBC 10.6* 12/28/2015   HGB 15.6 01/08/2016   HCT 46.0 01/08/2016   MCV 87.8 12/28/2015   PLT 263 12/28/2015   BMET    Component Value Date/Time   NA 142 01/08/2016 0936   K 4.4 01/08/2016 0936   CL 107 01/08/2016 0936   CO2 27 12/28/2015 1139   GLUCOSE 137* 01/08/2016 0936   BUN 23* 01/08/2016 0936   CREATININE 1.10 01/08/2016 0936   CALCIUM 10.2 12/28/2015 1139   GFRNONAA 41* 12/28/2015 1139   GFRAA 47* 12/28/2015 1139     Assessment/Plan: 2 Days Post-Op   Active Problems:   Primary osteoarthritis of left hip   Up with therapy WBAT in the LLE ASA 325mg  daily for DVT prophy   Torrance Frech Marie 01/10/2016, 10:38 AM Cell [phone]

## 2016-01-10 NOTE — Progress Notes (Signed)
Physical Therapy Treatment Patient Details Name: Raymond Barrera MRN: [id number] DOB: 12-05-1960 Today's Date: 01/10/2016    History of Present Illness 55 y.o. male now s/p Lt anterior THA. PMH: MI, CVA, diabetes, anxiety, back pain, Rt THA.     PT Comments    Patient is making progress toward mobility goals and stair training complete. Wife present for session and actively participating. Current plan remains appropriate.   Follow Up Recommendations  Home health PT;Supervision for mobility/OOB     Equipment Recommendations  Rolling walker with 5" wheels    Recommendations for Other Services       Precautions / Restrictions Precautions Precautions: Fall Precaution Comments: no hip precautions Restrictions Weight Bearing Restrictions: Yes LLE Weight Bearing: Weight bearing as tolerated    Mobility  Bed Mobility               General bed mobility comments: OOB in chair upon arrival  Transfers Overall transfer level: Needs assistance Equipment used: Rolling walker (2 wheeled) Transfers: Sit to/from Stand Sit to Stand: Min assist         General transfer comment: min A to power up into standing and gain balance upon standing due to sharp pain; carry over of safe hand placement and vc for technique for descent to chair  Ambulation/Gait Ambulation/Gait assistance: Min guard Ambulation Distance (Feet): 125 Feet Assistive device: Rolling walker (2 wheeled) Gait Pattern/deviations: Step-through pattern Gait velocity: decreased   General Gait Details: cues for position of RW and upright posture initially   Stairs Stairs: Yes Stairs assistance: Min assist Stair Management: No rails;Backwards;With walker Number of Stairs: 2 General stair comments: wife present and actively participating in stair training and stabilizing RW for pt; vc for technique and seqencing; pt guarded and anxious but with no unsteadiness on stairs  Wheelchair Mobility    Modified Rankin  (Stroke Patients Only)       Balance Overall balance assessment: Needs assistance Sitting-balance support: No upper extremity supported;Feet supported Sitting balance-Leahy Scale: Good     Standing balance support: Bilateral upper extremity supported Standing balance-Leahy Scale: Poor                      Cognition Arousal/Alertness: Awake/alert Behavior During Therapy: WFL for tasks assessed/performed Overall Cognitive Status: Within Functional Limits for tasks assessed                      Exercises      General Comments General comments (skin integrity, edema, etc.): wife present for session      Pertinent Vitals/Pain Pain Assessment: Faces Faces Pain Scale: Hurts even more Pain Location: L hip Pain Descriptors / Indicators: Sore;Sharp Pain Intervention(s): Monitored during session;Premedicated before session;Repositioned;Limited activity within patient's tolerance    Home Living                      Prior Function            PT Goals (current goals can now be found in the care plan section) Acute Rehab PT Goals Patient Stated Goal: go home PT Goal Formulation: With patient Time For Goal Achievement: 01/23/16 Potential to Achieve Goals: Good Progress towards PT goals: Progressing toward goals    Frequency  7X/week    PT Plan Current plan remains appropriate    Co-evaluation             End of Session Equipment Utilized During Treatment: Gait belt Activity Tolerance:  Patient tolerated treatment well Patient left: in chair;with call bell/phone within reach;with family/visitor present     Time: [id number] PT Time Calculation (min) (ACUTE ONLY): 22 min  Charges:  $Gait Training: 8-22 mins                    G Codes:      [place]ina April, PTA Pager: [phone]   01/10/2016, 9:36 AM

## 2016-01-10 NOTE — Discharge Summary (Signed)
Physician Discharge Summary  Patient ID: AUTHOR SLAVEN MRN: [id number] DOB/AGE: June 14, 1961 55 y.o.  Admit date: 01/08/2016 Discharge date: 01/10/2016  Admission Diagnoses:  Primary osteoarthritis of left hip  Discharge Diagnoses:  Principal Problem:   Primary osteoarthritis of left hip Active Problems:   Current smoker   Past Medical History  Diagnosis Date  . High cholesterol     takes Zocor daily  . Stroke ([place]) 08/2013  . Arthritis   . Headache     migraines  . Anxiety     takes Valium daily as needed  . Diabetes mellitus without complication ([place])     takes Metformin daily  . Hypertension     takes Benazepril and HCTZ  daily  . TIA (transient ischemic attack) 2014    x 7   . Pneumonia     hx of-80's  . Joint pain   . Joint swelling   . Back pain     hx of buldging disc  . PONV (postoperative nausea and vomiting)     took a while for him to wake up after previous anesthesia  . Myocardial infarction ([place]) 1987  . Slurred speech   . Memory impairment     occassional - from stroke  . Shortness of breath dyspnea     with exertion  . Urinary frequency     Surgeries: Procedure(s): TOTAL HIP ARTHROPLASTY ANTERIOR APPROACH on 01/08/2016   Consultants (if any):    Discharged Condition: Improved  Hospital Course: Raymond Barrera is an 55 y.o. male who was admitted 01/08/2016 with a diagnosis of Primary osteoarthritis of left hip and went to the operating room on 01/08/2016 and underwent the above named procedures.    He was given perioperative antibiotics:  Anti-infectives    Start     Dose/Rate Route Frequency Ordered Stop   01/08/16 1900  ceFAZolin (ANCEF) IVPB 2 g/50 mL premix     2 g 100 mL/hr over 30 Minutes Intravenous Every 6 hours 01/08/16 1833 01/09/16 0130   01/08/16 1030  ceFAZolin (ANCEF) IVPB 2 g/50 mL premix     2 g 100 mL/hr over 30 Minutes Intravenous To [facility] 01/07/16 1104 01/08/16 1023    .  He was given sequential compression  devices, early ambulation, and ASA 325mg  for DVT prophylaxis.  He benefited maximally from the hospital stay and there were no complications.    Recent vital signs:  Filed Vitals:   01/10/16 0500 01/10/16 0801  BP: 129/88 141/95  Pulse: 102   Temp: 98.1 F (36.7 C)   Resp: 18     Recent laboratory studies:  Lab Results  Component Value Date   HGB 15.6 01/08/2016   HGB 14.6 01/08/2016   HGB 14.8 12/28/2015   Lab Results  Component Value Date   WBC 10.6* 12/28/2015   PLT 263 12/28/2015   Lab Results  Component Value Date   INR 1.06 12/28/2015   Lab Results  Component Value Date   NA 142 01/08/2016   K 4.4 01/08/2016   CL 107 01/08/2016   CO2 27 12/28/2015   BUN 23* 01/08/2016   CREATININE 1.10 01/08/2016   GLUCOSE 137* 01/08/2016    Discharge Medications:     Medication List    TAKE these medications        aspirin 325 MG tablet  Take 1 tablet (325 mg total) by mouth daily.     benazepril 20 MG tablet  Commonly known as:  LOTENSIN  Take  20 mg by mouth daily.     celecoxib 100 MG capsule  Commonly known as:  CELEBREX  Take 1 capsule (100 mg total) by mouth 2 (two) times daily.     diazepam 10 MG tablet  Commonly known as:  VALIUM  Take 1 tablet (10 mg total) by mouth every 6 (six) hours as needed for anxiety.     docusate sodium 100 MG capsule  Commonly known as:  COLACE  Take 1 capsule (100 mg total) by mouth 2 (two) times daily.     metFORMIN 500 MG tablet  Commonly known as:  GLUCOPHAGE  Take 500 mg by mouth 2 (two) times daily with a meal.     ondansetron 4 MG tablet  Commonly known as:  ZOFRAN  Take 1 tablet (4 mg total) by mouth every 8 (eight) hours as needed for nausea or vomiting.     oxyCODONE-acetaminophen 10-325 MG tablet  Commonly known as:  PERCOCET  Take 1 tablet by mouth every 4 (four) hours as needed for pain.        Diagnostic Studies: Dg Hip Operative Unilat W Or W/o Pelvis Left  01/08/2016  CLINICAL DATA:  Left total  hip arthroplasty. EXAM: OPERATIVE LEFT HIP (WITH PELVIS IF PERFORMED) 2 VIEWS TECHNIQUE: Fluoroscopic spot image(s) were submitted for interpretation post-operatively. COMPARISON:  None. FINDINGS: Two fluoroscopic images are provided from a total left hip arthroplasty. Hardware appears intact and appropriately positioned. No evidence of surgical complicating feature. IMPRESSION: Fluoroscopic spot images from a left hip arthroplasty. Hardware appears intact and in appropriate positioning. No evidence of surgical complicating feature seen. Electronically Signed   By: Franki Cabot M.D.   On: 01/08/2016 13:16    Disposition: 01-Home or Self Care      Discharge Instructions    Weight bearing as tolerated    Complete by:  As directed   Laterality:  left  Extremity:  Lower           Follow-up Information    Follow up with MURPHY, TIMOTHY D, MD In 10 days.   Specialty:  Orthopedic Surgery   Contact information:   [address] [place] [address] [phone]       Follow up with [facility].   Why:  They will contact you to schedule home therapy visits.   Contact information:   [address] [place] [address] [phone]        Signed: Gae Dry 01/10/2016, 10:40 AM Cell [phone]

## 2016-01-29 ENCOUNTER — Ambulatory Visit (HOSPITAL_COMMUNITY): Payer: Medicaid Other | Attending: Orthopedic Surgery | Admitting: Physical Therapy

## 2016-02-04 ENCOUNTER — Ambulatory Visit (HOSPITAL_COMMUNITY): Payer: Medicaid Other | Attending: Orthopedic Surgery | Admitting: Physical Therapy

## 2016-02-04 DIAGNOSIS — M25552 Pain in left hip: Secondary | ICD-10-CM

## 2016-02-04 DIAGNOSIS — M6281 Muscle weakness (generalized): Secondary | ICD-10-CM | POA: Diagnosis present

## 2016-02-04 DIAGNOSIS — Z9181 History of falling: Secondary | ICD-10-CM

## 2016-02-04 DIAGNOSIS — R2681 Unsteadiness on feet: Secondary | ICD-10-CM | POA: Insufficient documentation

## 2016-02-04 DIAGNOSIS — R269 Unspecified abnormalities of gait and mobility: Secondary | ICD-10-CM | POA: Diagnosis present

## 2016-02-04 DIAGNOSIS — M5441 Lumbago with sciatica, right side: Secondary | ICD-10-CM | POA: Insufficient documentation

## 2016-02-04 DIAGNOSIS — Z96642 Presence of left artificial hip joint: Secondary | ICD-10-CM | POA: Insufficient documentation

## 2016-02-04 NOTE — Therapy (Signed)
[place], [place], [address] Phone: [phone]   Fax:  [phone]  Physical Therapy Evaluation  Patient Details  Name: Raymond Barrera MRN: [id number] Date of Birth: 1961/01/14 Referring Provider: Edmonia Lynch, MD  Encounter Date: 02/04/2016      PT End of Session - 02/04/16 1138    Visit Number 1   Number of Visits 18   Date for PT Re-Evaluation 03/03/16   Authorization Type Medicaid (pending- please check prior to 2nd session)   Authorization Time Period 02/04/16 to 03/17/16   PT Start Time 1018   PT Stop Time 1056   PT Time Calculation (min) 38 min   Activity Tolerance Patient limited by pain   Behavior During Therapy [facility] for tasks assessed/performed      Past Medical History  Diagnosis Date  . High cholesterol     takes Zocor daily  . Stroke ([place]) 08/2013  . Arthritis   . Headache     migraines  . Anxiety     takes Valium daily as needed  . Diabetes mellitus without complication ([place])     takes Metformin daily  . Hypertension     takes Benazepril and HCTZ  daily  . TIA (transient ischemic attack) 2014    x 7   . Pneumonia     hx of-80's  . Joint pain   . Joint swelling   . Back pain     hx of buldging disc  . PONV (postoperative nausea and vomiting)     took a while for him to wake up after previous anesthesia  . Myocardial infarction ([place]) 1987  . Slurred speech   . Memory impairment     occassional - from stroke  . Shortness of breath dyspnea     with exertion  . Urinary frequency     Past Surgical History  Procedure Laterality Date  . Ankle surgery  2008    left ankle-otif-[facility]  . Mass excision  09/13/2012    Procedure: EXCISION MASS;  Surgeon: Jamesetta So, MD;  [facility]: [facility];  Service: General;  Laterality: N/A;  . Tonsillectomy    . Lumbar laminectomy/decompression microdiscectomy Right 10/17/2014    Procedure: LUMBAR LAMINECTOMY/DECOMPRESSION MICRODISCECTOMY 2  LEVELS;  Surgeon: [name], MD;  Location: [place] [facility];  Service: Neurosurgery;  Laterality: Right;  Right L45 L5S1 laminectomy and foraminotomy  . 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 Total hip arthroplasty Right 03/20/2015    Procedure: TOTAL HIP ARTHROPLASTY ANTERIOR APPROACH;  Surgeon: Renette Butters, MD;  Location: [place];  Service: Orthopedics;  Laterality: Right;  . Back surgery    . Total hip arthroplasty Left 01/08/2016    Procedure: TOTAL HIP ARTHROPLASTY ANTERIOR APPROACH;  Surgeon: Renette Butters, MD;  Location: [place];  Service: Orthopedics;  Laterality: Left;    There were no vitals filed for this visit.  Visit Diagnosis:  History of hip replacement, total, left - Plan: PT plan of care cert/re-cert  Muscle weakness - Plan: PT plan of care cert/re-cert  Left hip pain - Plan: PT plan of care cert/re-cert  Gait difficulty - Plan: PT plan of care cert/re-cert  Unsteadiness - Plan: PT plan of care cert/re-cert  Risk for falls - Plan: PT plan of care cert/re-cert      Subjective Assessment - 02/04/16 1023    Subjective Pt reports initially having R hip pain and joint degeneration and says he had the same  problem with his L hip, so he ended up going in fot surgery on 01/08/16. He received HHPT for a couple of visits and has been discharged. He felt that they worked him pretty good, but continues to have issues with his hip.    Pertinent History DM, TIA, HTN, smoker, cocaine use, lumbago, ankle sx, lumbar/cervical sx, MI, R THA (ant)   How long can you sit comfortably? 60-90 minutes    How long can you stand comfortably? immediate discomfort   How long can you walk comfortably? immediate discomfort   Patient Stated Goals Return to PLOF   Currently in Pain? Yes   Pain Score 9    Pain Orientation Left;Right   Pain Descriptors / Indicators Throbbing;Tingling;Stabbing   Pain Radiating Towards Tingling into B anterior thighs; reports he feels he is wearing a sock on  BLE; denies any bowel or bladder issues   Pain Onset Other (comment)  acute   Pain Frequency Constant   Aggravating Factors  laying down   Pain Relieving Factors medication   Effect of Pain on Daily Activities Unable to return to painting            [facility] PT Assessment - 02/04/16 0001    Assessment   Medical Diagnosis L ant hip replacement   Referring Provider Edmonia Lynch, MD   Onset Date/Surgical Date 01/08/16   Next MD Visit 02/27/2016   Precautions   Precautions Anterior Hip   Precaution Comments L ant   Restrictions   Weight Bearing Restrictions No   Balance Screen   Has the patient fallen in the past 6 months Yes   How many times? 1  pt was taking the trash out and fell; No injuries noted   Has the patient had a decrease in activity level because of a fear of falling?  No   Is the patient reluctant to leave their home because of a fear of falling?  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   Observation/Other Assessments   Observations Supine LE leg length symmetrical   Strength   Right Hip Flexion 3+/5   Left Hip Flexion 2/5  limited by pain   Left Hip ABduction 2+/5   Right Knee Flexion 3+/5   Right Knee Extension 4/5   Left Knee Flexion 3-/5   Left Knee Extension 3+/5   Right Ankle Dorsiflexion 5/5   Left Ankle Dorsiflexion 4+/5   Bed Mobility   Bed Mobility Rolling Right   Rolling Right 4: Min assist   Transfers   Five time sit to stand comments  22.72 with UE     Ambulation/Gait   Gait Comments Decreased speed, decreased BLE step length; flexed trunk   Timed Up and Go Test   Normal TUG (seconds) 20.32  with RW                           PT Education - 02/04/16 1051    Education provided Yes   Education Details Reviewed anterior hip precautions;  discussed/reviewed HHPT HEP and the importance of performing the exercises 2-3 times a day; discussed medicaid and waiting period for authorization    Person(s) Educated Patient   Methods Explanation;Demonstration   Comprehension Verbalized understanding          PT Short  Term Goals - 02/04/16 1129    PT SHORT TERM GOAL #1   Title Patient to be able to ambulate at least 1020ft, untimed, with LRAD and correct gait sequencing, minimal unsteadiness, pain L hip no more than 4/10 in order to assist in improving general safe mobiltiy    Time 3   Period Weeks   Status New   PT SHORT TERM GOAL #2   Title Patient to be able to complete TUG in 12 seconds or less with LRAD, supervision, minimal unsteadiness, in order to demontrate improved safety and balance    Time 3   Period Weeks   Status New   PT SHORT TERM GOAL #3   Title Patient to be independent in all bed mobilty with good awareness of hip precautions and safety in order to enhance function within the home    Time 3   Period Weeks   Status New   PT SHORT TERM GOAL #4   Title Patient to be able to correctly list and apply anterior hip precautions during all functional moblity and functional activities in order to maintain safety and integrity of L hip during dynamic situations    Time 3   Period Weeks   Status New           PT Long Term Goals - 02/04/16 1132    PT LONG TERM GOAL #1   Title Patient to be independent in correclty and consistently performing appropraite advanced HEP, to be updated PRN    Time 6   Period Weeks   Status New   PT LONG TERM GOAL #2   Title Patient to demosntrate strength at least 4+/5 in all tested muscles in order to assist in reducing pain, correcting mechanics, and improving general stability with reduced fall risk    Time 6   Period Weeks   Status New   PT LONG TERM GOAL #3   Title Patient to be able to ambulate unlimited distances over even and uneven surfaces with LRAD and pain L hip no  more than 2/10 in order to enhance independence in community and at home    Time 6   Period Weeks   Status New   PT LONG TERM GOAL #4   Title Patient to be able to perform five times sit to stand in 10 seconds or less without use of UEs in order to demosntrate improved muscle strength and power, improved general mobiltiy    Time 6   Period Weeks   Status New   PT LONG TERM GOAL #5   Title Patient to report 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 He also reports that he has been very pain limited so far and that he has been having some numbness/tingling going down the front of both legs with no bladder/bowel symptoms; upon further questioning, suspect that this may be  coming from his back rather than related to his hip per the bilateral nature of symtpoms. Upon examination, revealed muscle weakness, difficulty with functional mobility, reduced functional activity tolerance, gait impairemnt, unsteadiness, reduced muscle power, reduced safety awareness, and reduced functional task performance skills. At this point recommend skilled PT services in order to address functional limitaiotns and assist patient in returning to optimal level of function.    Pt will benefit from skilled therapeutic intervention in order to improve on the following deficits Abnormal gait;Decreased activity tolerance;Decreased balance;Decreased mobility;Decreased knowledge of precautions;Decreased endurance;Decreased coordination;Decreased range of  motion;Difficulty walking;Decreased safety awareness;Pain;Decreased strength;Improper body mechanics;Postural dysfunction   Rehab Potential Good   PT Frequency 3x / week   PT Duration 6 weeks   PT Treatment/Interventions ADLs/Self Care Home Management;Cryotherapy;Electrical Stimulation;DME Instruction;Gait training;Stair training;Functional mobility training;Therapeutic activities;Therapeutic exercise;Balance training;Neuromuscular re-education;Patient/family education;Manual techniques;Scar mobilization;Energy conservation;Taping   PT Next Visit Plan review initial eval/goals; functional stretching and strengthening as appropriate for anterior hip, manual PRN, gait and balance    PT Home Exercise Plan staying with HHPT HEP right now    Consulted and Agree with Plan of Care Patient         Problem List Patient Active Problem List   Diagnosis Date Noted  . Primary osteoarthritis of left hip 01/08/2016  . DJD (degenerative joint disease) 03/20/2015  . Primary osteoarthritis of right hip 02/26/2015  . Lumbar spondylosis 10/17/2014  . Lumbago 07/25/2014  . Abnormality of gait 07/25/2014  . Difficulty in walking(719.7) 07/25/2014  . TIA (transient ischemic attack) 03/09/2013  . Cocaine abuse 03/09/2013  . Accelerated hypertension 03/09/2013  . Other and unspecified hyperlipidemia 03/09/2013  . Current smoker 03/09/2013    Elly Modena PT, DPT  Deniece Ree PT, DPT [place] [address] [place], [place], [address] Phone: [phone]   Fax:  [phone]  Name: CYREE HILINSKI MRN: [id number] Date of Birth: 1961/07/31

## 2016-02-15 ENCOUNTER — Encounter (HOSPITAL_COMMUNITY): Payer: Self-pay | Admitting: Physical Therapy

## 2016-02-18 ENCOUNTER — Encounter (HOSPITAL_COMMUNITY): Payer: Self-pay | Admitting: Physical Therapy

## 2016-02-18 ENCOUNTER — Telehealth (HOSPITAL_COMMUNITY): Payer: Self-pay | Admitting: Physical Therapy

## 2016-02-18 NOTE — Telephone Encounter (Signed)
Pt called to let him know that his appointments have not been approved from his insurance at this time.  Visit for today will be cancelled.  Rayetta Humphrey, [place] CLT [phone]

## 2016-02-20 ENCOUNTER — Telehealth (HOSPITAL_COMMUNITY): Payer: Self-pay

## 2016-02-20 ENCOUNTER — Ambulatory Visit (HOSPITAL_COMMUNITY): Payer: Medicaid Other

## 2016-02-20 NOTE — Telephone Encounter (Signed)
Called and left message for pt regarding Medicaid authorization, still waiting for approval.  Ihor Austin, [place]; [facility] [phone]

## 2016-02-22 ENCOUNTER — Ambulatory Visit (HOSPITAL_COMMUNITY): Payer: Medicaid Other | Admitting: Physical Therapy

## 2016-02-22 DIAGNOSIS — R2681 Unsteadiness on feet: Secondary | ICD-10-CM

## 2016-02-22 DIAGNOSIS — M6281 Muscle weakness (generalized): Secondary | ICD-10-CM

## 2016-02-22 DIAGNOSIS — Z96642 Presence of left artificial hip joint: Secondary | ICD-10-CM

## 2016-02-22 DIAGNOSIS — M25552 Pain in left hip: Secondary | ICD-10-CM

## 2016-02-22 DIAGNOSIS — R269 Unspecified abnormalities of gait and mobility: Secondary | ICD-10-CM

## 2016-02-22 DIAGNOSIS — M5441 Lumbago with sciatica, right side: Secondary | ICD-10-CM

## 2016-02-22 DIAGNOSIS — Z9181 History of falling: Secondary | ICD-10-CM

## 2016-02-22 NOTE — Patient Instructions (Signed)
Heel Raise: Bilateral (Standing)    Rise on balls of feet. Repeat _10___ times per set. Do _1___ sets per session. Do _2___ sessions per day.  http://orth.exer.us/38   Copyright  VHI. All rights reserved.  Functional Quadriceps: Chair Squat    Keeping feet flat on floor, shoulder width apart, squat as low as is comfortable. Use support as necessary. Repeat __10__ times per set. Do _1___ sets per session. Do _2___ sessions per day.  http://orth.exer.us/736   Copyright  VHI. All rights reserved.  Knee Extension (Sitting)    Place _3___ pound weight on left ankle and straighten knee fully, lower slowly. Repeat _10___ times per set. Do ____ sets per session. Do __2__ sessions per day. 1 http://orth.exer.us/732   Copyright  VHI. All rights reserved.

## 2016-02-22 NOTE — Therapy (Signed)
[place], [place], [address] Phone: [phone]   Fax:  [phone]  Physical Therapy Treatment  Patient Details  Name: Raymond Barrera MRN: [id number] Date of Birth: [birth year] Referring Provider: Edmonia Lynch, MD  Encounter Date: 02/22/2016      PT End of Session - 02/22/16 1004    Visit Number 2   Number of Visits 18   Date for PT Re-Evaluation 03/03/16   Authorization Type Medicaid (pending- please check prior to 2nd session)   Authorization Time Period 02/04/16 to 03/17/16   PT Start Time 0930   PT Stop Time 1005   PT Time Calculation (min) 35 min   Equipment Utilized During Treatment Gait belt   Activity Tolerance Patient tolerated treatment well   Behavior During Therapy [facility] for tasks assessed/performed      Past Medical History  Diagnosis Date  . High cholesterol     takes Zocor daily  . Stroke ([place]) 08/2013  . Arthritis   . Headache     migraines  . Anxiety     takes Valium daily as needed  . Diabetes mellitus without complication ([place])     takes Metformin daily  . Hypertension     takes Benazepril and HCTZ  daily  . TIA (transient ischemic attack) 2014    x 7   . Pneumonia     hx of-80's  . Joint pain   . Joint swelling   . Back pain     hx of buldging disc  . PONV (postoperative nausea and vomiting)     took a while for him to wake up after previous anesthesia  . Myocardial infarction ([place]) 1987  . Slurred speech   . Memory impairment     occassional - from stroke  . Shortness of breath dyspnea     with exertion  . Urinary frequency     Past Surgical History  Procedure Laterality Date  . Ankle surgery  2008    left ankle-otif-[facility]  . Mass excision  09/13/2012    Procedure: EXCISION MASS;  Surgeon: Jamesetta So, MD;  Location: [facility];  Service: General;  Laterality: N/A;  . Tonsillectomy    . Lumbar laminectomy/decompression microdiscectomy Right 10/17/2014    Procedure:  LUMBAR LAMINECTOMY/DECOMPRESSION MICRODISCECTOMY 2 LEVELS;  Surgeon: [name], MD;  Location: [place] [facility];  Service: Neurosurgery;  Laterality: Right;  Right L45 L5S1 laminectomy and foraminotomy  . 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 Total hip arthroplasty Right 03/20/2015    Procedure: TOTAL HIP ARTHROPLASTY ANTERIOR APPROACH;  Surgeon: Renette Butters, MD;  Location: [place];  Service: Orthopedics;  Laterality: Right;  . Back surgery    . Total hip arthroplasty Left 01/08/2016    Procedure: TOTAL HIP ARTHROPLASTY ANTERIOR APPROACH;  Surgeon: Renette Butters, MD;  Location: [place];  Service: Orthopedics;  Laterality: Left;    There were no vitals filed for this visit.  Visit Diagnosis:  History of hip replacement, total, left  Muscle weakness  Left hip pain  Gait difficulty  Unsteadiness  Risk for falls  Bilateral low back pain with right-sided sciatica  Abnormality of gait      Subjective Assessment - 02/22/16 0932    Subjective Pt states that he has been doing his exercises at home.  Pt is nauseated due to taking medication without eating    Pertinent History DM, TIA, HTN, smoker, cocaine use, lumbago, ankle sx, lumbar/cervical sx, MI,  R THA (ant)   Currently in Pain? Yes   Pain Score 9    Pain Location Hip   Pain Orientation Right;Left   Pain Descriptors / Indicators Aching;Throbbing   Pain Type Chronic pain;Surgical pain   Pain Radiating Towards thighs   Pain Onset More than a month ago   Pain Frequency Constant   Aggravating Factors  layng down   Pain Relieving Factors medication                         [facility] Adult PT Treatment/Exercise - 02/22/16 0001    Exercises   Exercises Knee/Hip   Knee/Hip Exercises: Standing   Heel Raises Both;10 reps   Hip Abduction Stengthening;Both;10 reps   Hip Extension Stengthening;Both;10 reps   Functional Squat 10 reps                PT Education - 02/22/16 1014    Education provided  Yes   Education Details for HEP and the importance of eating a full meal prior to taking medication.    Person(s) Educated Patient   Methods Explanation   Comprehension Verbalized understanding          PT Short Term Goals - 02/22/16 1007    PT SHORT TERM GOAL #1   Title Patient to be able to ambulate at least 1070ft, untimed, with LRAD and correct gait sequencing, minimal unsteadiness, pain L hip no more than 4/10 in order to assist in improving general safe mobiltiy    Time 3   Period Weeks   Status On-going   PT SHORT TERM GOAL #2   Title Patient to be able to complete TUG in 12 seconds or less with LRAD, supervision, minimal unsteadiness, in order to demontrate improved safety and balance    Time 3   Period Weeks   Status On-going   PT SHORT TERM GOAL #3   Title Patient to be independent in all bed mobilty with good awareness of hip precautions and safety in order to enhance function within the home    Time 3   Period Weeks   Status On-going   PT SHORT TERM GOAL #4   Title Patient to be able to correctly list and apply anterior hip precautions during all functional moblity and functional activities in order to maintain safety and integrity of L hip during dynamic situations    Time 3   Period Weeks   Status On-going           PT Long Term Goals - 02/22/16 1008    PT LONG TERM GOAL #1   Title Patient to be independent in correclty and consistently performing appropraite advanced HEP, to be updated PRN    Time 6   Period Weeks   Status On-going   PT LONG TERM GOAL #2   Title Patient to demosntrate strength at least 4+/5 in all tested muscles in order to assist in reducing pain, correcting mechanics, and improving general stability with reduced fall risk    Time 6   Period Weeks   Status On-going   PT LONG TERM GOAL #3   Title Patient to be able to ambulate unlimited distances over even and uneven surfaces with LRAD and pain L hip no more than 2/10 in order to  enhance independence in community and at home    Time 6   Period Weeks   Status On-going   PT LONG TERM GOAL #4   Title Patient  to be able to perform five times sit to stand in 10 seconds or less without use of UEs in order to demosntrate improved muscle strength and power, improved general mobiltiy    Time 6   Period Weeks   Status On-going   PT LONG TERM GOAL #5   Title Patient to report 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 Pt states that his is nauseated from taking medication wihout a meal but ate prior to coming and is feeling better.  Pt completed heel raises and squats when he became very nauseated and sweating.  Pt was unable to continute the rest of the session.  Pulse taken at 66; BP 150/110 patient states that he took his blood pressure medication today.  Therapist urged pt to take BP again later today.  Pt given HEP for heel raises, squats and long arc quad. Therapist  explained to patient and patient's wife the importance of eating a substantial breakfast then taking his medication.    Pt will benefit from skilled therapeutic intervention in order to improve on the following deficits Abnormal gait;Decreased activity tolerance;Decreased balance;Decreased mobility;Decreased knowledge of precautions;Decreased endurance;Decreased coordination;Decreased range of motion;Difficulty walking;Decreased safety awareness;Pain;Decreased strength;Improper body mechanics;Postural dysfunction   Clinical Impairments Affecting Rehab Potential Long history of low back pain, and other weakness secondaary to multiple strokes   PT Frequency 3x / week   PT Duration 6 weeks   PT Treatment/Interventions  ADLs/Self Care Home Management;Cryotherapy;Electrical Stimulation;DME Instruction;Gait training;Stair training;Functional mobility training;Therapeutic activities;Therapeutic exercise;Balance training;Neuromuscular re-education;Patient/family education;Manual techniques;Scar mobilization;Energy conservation;Taping   PT Next Visit Plan ; functional stretching and strengthening as appropriate for anterior hip, manual PRN, gait and balance         Problem List Patient Active Problem List   Diagnosis Date Noted  . Primary osteoarthritis of left hip 01/08/2016  . DJD (degenerative joint disease) 03/20/2015  . Primary osteoarthritis of right hip 02/26/2015  . Lumbar spondylosis 10/17/2014  . Lumbago 07/25/2014  . Abnormality of gait 07/25/2014  . Difficulty in walking(719.7) 07/25/2014  . TIA (transient ischemic attack) 03/09/2013  . Cocaine abuse 03/09/2013  . Accelerated hypertension 03/09/2013  . Other and unspecified hyperlipidemia 03/09/2013  . Current smoker 03/09/2013    Rayetta Humphrey, PT CLT [phone] 02/22/2016, 10:15 AM  [place], [place], [address] Phone: [phone]   Fax:  [phone]  Name: Raymond Barrera MRN: [id number] Date of Birth: 25-Apr-1961

## 2016-02-25 ENCOUNTER — Telehealth (HOSPITAL_COMMUNITY): Payer: Self-pay

## 2016-02-25 ENCOUNTER — Ambulatory Visit (HOSPITAL_COMMUNITY): Payer: Medicaid Other

## 2016-02-25 NOTE — Telephone Encounter (Signed)
Therapist called this date to notify the patient of his missed visit/no show for his 9:30 am PT appointment. Patient stated " I'm hurting today and I'm not feeling well", therefore, he did not come to his PT visit today.  Therapist informed the patient of his next secheduled PT visit on 02/27/2016. Patient verbalized full understanding and noted that he will try his best to make his next PT apportionment.   Garen Lah, PT, DPT  02/25/16

## 2016-02-27 ENCOUNTER — Encounter (HOSPITAL_COMMUNITY): Payer: Self-pay

## 2016-02-27 ENCOUNTER — Encounter (HOSPITAL_COMMUNITY): Payer: Self-pay | Admitting: Physical Therapy

## 2016-02-27 ENCOUNTER — Ambulatory Visit (HOSPITAL_COMMUNITY): Payer: Medicaid Other

## 2016-02-27 DIAGNOSIS — M6281 Muscle weakness (generalized): Secondary | ICD-10-CM

## 2016-02-27 DIAGNOSIS — R269 Unspecified abnormalities of gait and mobility: Secondary | ICD-10-CM

## 2016-02-27 DIAGNOSIS — Z96642 Presence of left artificial hip joint: Secondary | ICD-10-CM

## 2016-02-27 DIAGNOSIS — Z9181 History of falling: Secondary | ICD-10-CM

## 2016-02-27 DIAGNOSIS — M25552 Pain in left hip: Secondary | ICD-10-CM

## 2016-02-27 DIAGNOSIS — R2681 Unsteadiness on feet: Secondary | ICD-10-CM

## 2016-02-27 DIAGNOSIS — M5441 Lumbago with sciatica, right side: Secondary | ICD-10-CM

## 2016-02-27 NOTE — Patient Instructions (Signed)
Refer to Chart/ Hard copy scanned in

## 2016-02-27 NOTE — Therapy (Signed)
[place], [place], [address] Phone: [phone]   Fax:  [phone]  Physical Therapy Treatment/Discharge Summary  Patient Details  Name: Raymond Barrera MRN: [id number] Date of Birth: Aug 05, 1961 Referring Provider: Dr. Edmonia Lynch  Encounter Date: 02/27/2016      PT End of Session - 02/27/16 1035    Visit Number 3   Number of Visits 18   Date for PT Re-Evaluation 03/03/16   Authorization Type Medicaid (3 visits approved)    Authorization Time Period 02/04/16 to 03/17/16   PT Start Time 5366  pt arrived 7 minutes late   PT Stop Time 1100   PT Time Calculation (min) 37 min   Equipment Utilized During Treatment Gait belt   Activity Tolerance Patient tolerated treatment well   Behavior During Therapy [facility] for tasks assessed/performed      Past Medical History  Diagnosis Date  . High cholesterol     takes Zocor daily  . Stroke ([place]) 08/2013  . Arthritis   . Headache     migraines  . Anxiety     takes Valium daily as needed  . Diabetes mellitus without complication ([place])     takes Metformin daily  . Hypertension     takes Benazepril and HCTZ  daily  . TIA (transient ischemic attack) 2014    x 7   . Pneumonia     hx of-80's  . Joint pain   . Joint swelling   . Back pain     hx of buldging disc  . PONV (postoperative nausea and vomiting)     took a while for him to wake up after previous anesthesia  . Myocardial infarction ([place]) 1987  . Slurred speech   . Memory impairment     occassional - from stroke  . Shortness of breath dyspnea     with exertion  . Urinary frequency     Past Surgical History  Procedure Laterality Date  . Ankle surgery  2008    left ankle-otif-[facility]  . Mass excision  09/13/2012    Procedure: EXCISION MASS;  Surgeon: Jamesetta So, MD;  Location: [facility];  Service: General;  Laterality: N/A;  . Tonsillectomy    . Lumbar laminectomy/decompression microdiscectomy Right  10/17/2014    Procedure: LUMBAR LAMINECTOMY/DECOMPRESSION MICRODISCECTOMY 2 LEVELS;  Surgeon: [name], MD;  Location: [place] [facility];  Service: Neurosurgery;  Laterality: Right;  Right L45 L5S1 laminectomy and foraminotomy  . 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 Total hip arthroplasty Right 03/20/2015    Procedure: TOTAL HIP ARTHROPLASTY ANTERIOR APPROACH;  Surgeon: Renette Butters, MD;  Location: [place];  Service: Orthopedics;  Laterality: Right;  . Back surgery    . Total hip arthroplasty Left 01/08/2016    Procedure: TOTAL HIP ARTHROPLASTY ANTERIOR APPROACH;  Surgeon: Renette Butters, MD;  Location: [place];  Service: Orthopedics;  Laterality: Left;    There were no vitals filed for this visit.  Visit Diagnosis:  History of hip replacement, total, left  Muscle weakness  Left hip pain  Gait difficulty  Unsteadiness  Risk for falls  Bilateral low back pain with right-sided sciatica  Abnormality of gait      Subjective Assessment - 02/27/16 1029    Subjective [name] reports that his L hip "feels pretty good today" with pain rated a 4/10 on a VAS. Pt reports good compliance with his HEP with no issues reported. Pt reported that he  has a post-op f/u appt with Dr. Percell Miller later this afternoon at 2:15pm. Pt reports that his L hip pain has improved and is "healing" and progressing.   Pertinent History DM, TIA, HTN, smoker, cocaine use, lumbago, ankle sx, lumbar/cervical sx, MI, R THA (ant)   Limitations Walking;Standing   How long can you sit comfortably? 30 minutes    How long can you stand comfortably? 5-10 minutes    How long can you walk comfortably? ~600 feet with FWW    Patient Stated Goals Return to PLOF   Currently in Pain? Yes   Pain Score 4   L hip pain ranges between a 0-7/10 on a VAS   Pain Location Hip   Pain Orientation Right;Left   Pain Descriptors / Indicators Aching   Pain Type Chronic pain;Surgical pain   Pain Onset More than a month ago   Pain  Frequency Constant   Aggravating Factors  long distance ambulation, L S/L position, and prolonged periods of sitting   Pain Relieving Factors medication, sitting, and rest    Effect of Pain on Daily Activities Difficulty with long distance community ambulation             [facility] PT Assessment - 02/27/16 0001    Assessment   Medical Diagnosis L ant hip replacement   Referring Provider Dr. 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   Observation/Other Assessments   Observations Supine LE leg length symmetrical   Strength   Right Hip Flexion 3+/5   Left Hip Flexion 3/5  limited by pain   Left Hip ABduction 3/5   Right Knee Flexion 3+/5   Right Knee Extension 4/5   Left Knee Flexion 3+/5   Left Knee Extension 4-/5   Right Ankle Dorsiflexion 5/5   Left Ankle Dorsiflexion 4+/5   Bed Mobility   Bed Mobility Rolling Right   Rolling Right 6: Modified independent (Device/Increase time)   Transfers   Five time sit to stand comments  18 seconds with UE assist    Ambulation/Gait   Ambulation/Gait Yes   Ambulation/Gait Assistance 5: Supervision   Ambulation Distance (Feet) 750 Feet   Assistive device Straight cane/ FWW   Gait Pattern Step-through pattern;Trunk flexed;Decreased arm swing - left;Decreased arm swing - right  good cadence;   Gait Comments Pt demo a good continuous, step-through gait pattern with good heel to toe sequence assessed with the use of a SPC and FWW with pain rated a 4/10 on a VAS.    Timed Up and Go Test   Normal TUG (seconds) 15  with RW                     [facility] Adult PT  Treatment/Exercise - 02/27/16 0001    Knee/Hip Exercises: Standing   Heel Raises Both;20 reps   Hip Flexion Stengthening;Both;1 set;20 reps   Hip Abduction Stengthening;Left;1 set;15 reps   Lateral Step Up Left;1 set;15 reps;Hand Hold: 2;Step Height: 4"   Forward Step Up Left;1 set;Hand Hold: 2;Step Height: 4";15 reps   Functional Squat 2 sets;10 reps   Gait Training Completed  gait training with SPC around the clinic x 500 feet with focus on heel to toe sequence and to use SPC in contralateral hand. Completed an additional 250 feet with the use of a FWW with focus on upright posture and heel to toe gait sequence    Other Standing Knee Exercises All standing ther ex completed with B UE support    Knee/Hip Exercises: Seated   Long Arc Quad Strengthening;Left;1 set;10 reps   Long Arc Quad Limitations with red thera-band                 PT Education - 02/27/16 1035    Education provided Yes   Education Details updated HEP, gait pattern with SPC, progression with exercises/activities, and 5/5 fall precautions    Person(s) Educated Patient;Spouse   Methods Explanation;Handout;Demonstration;Verbal cues   Comprehension Verbalized understanding;Returned demonstration          PT Short Term Goals - 02/27/16 1037    PT SHORT TERM GOAL #1   Title Patient to be able to ambulate at least 1040f, untimed, with LRAD and correct gait sequencing, minimal unsteadiness, pain L hip no more than 4/10 in order to assist in improving general safe mobiltiy    Time 3   Period Weeks   Status Partially Met   PT SHORT TERM GOAL #2   Title Patient to be able to complete TUG in 12 seconds or less with LRAD, supervision, minimal unsteadiness, in order to demontrate improved safety and balance    Baseline 15 seconds with FWW    Time 3   Period Weeks   Status Partially Met   PT SHORT TERM GOAL #3   Title Patient to be independent in all bed mobilty with good awareness of hip precautions and safety in  order to enhance function within the home    Time 3   Period Weeks   Status Achieved   PT SHORT TERM GOAL #4   Title Patient to be able to correctly list and apply anterior hip precautions during all functional moblity and functional activities in order to maintain safety and integrity of L hip during dynamic situations    Time 3   Period Weeks   Status Achieved           PT Long Term Goals - 02/27/16 1038    PT LONG TERM GOAL #1   Title Patient to be independent in correclty and consistently performing appropraite advanced HEP, to be updated PRN    Time 6   Period Weeks   Status Achieved   PT LONG TERM GOAL #2   Title Patient to demosntrate strength at least 4+/5 in all tested muscles in order to assist in reducing pain, correcting mechanics, and improving general stability with reduced fall risk    Baseline Refer to assessment for MMT grades    Time 6   Period Weeks   Status Partially Met   PT LONG TERM GOAL #3   Title Patient to be able to ambulate unlimited distances over even and uneven surfaces with LRAD and pain L hip no more than 2/10 in order to enhance independence in community and at home    Baseline Pt demo a good continuous, step-through gait pattern with good heel to toe sequence assessed with the use of a SPC and FWW with pain rated a 4/10 on a VAS.    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 Choyce has been seen for 3 outpatient PT visits s/p L anterior approach THA completed by Dr. Percell Miller. The pt has made progress towards stated goals with improved L hip strength, less severe L hip pain, and improved gait pattern/sequence with a SPC and FWW. However, he continues to present with L hip strength deficits, L hip pain, and difficulty with long distance ambulation. The pt would benefit from continued skilled PT to address current deficits; however, his Medicaid insurance has only authorized 3 PT visits, which has been completed as of today. The pt was offered to self-pay for further PT visits, but the pt kindly declined and verbalized interest to continue independently with his updated HEP. The pt was provided an updated HEP ( heel raises, mini squats, hip abduction, marching, SLS, step-ups, and resisted LAQ) with a red piece of thera-band provided. The pt was able to safely demo all ther ex with good understanding demo and verbalized. Reviewed and completed gait training with SPC around the clinic with focus on heel to toe sequence and to use SPC in contralateral hand. Pt demo a good continuous, step-through gait pattern with good heel to toe sequence assessed with the use of a SPC. Pt was steady while ambulating with no path deviations assessed. Therapist instructed the pt to initiate use of his [place] indoors on level terrain for the next week and to progress to outdoor level terrain with stand by assist from his spouse the following week. Pt verbalized full understanding and agreement. Pt is discharged as of today secondary to no further authorized visits.    Pt will benefit from skilled therapeutic intervention in order to improve on the following deficits Abnormal gait;Decreased activity tolerance;Decreased balance;Decreased mobility;Decreased knowledge of precautions;Decreased endurance;Decreased  coordination;Decreased range of motion;Difficulty walking;Decreased safety awareness;Pain;Decreased strength;Improper body mechanics;Postural dysfunction   Rehab Potential Good   Clinical Impairments Affecting Rehab Potential Long history of low back pain, and other weakness secondaary to multiple strokes   PT Frequency --  No further PT due to limited to 3 PT visits from [facility]    PT Duration --  No further PT due to limited to 3 PT visits from [facility]    PT Treatment/Interventions ADLs/Self Care Home Management;Cryotherapy;Electrical Stimulation;DME Instruction;Gait training;Stair training;Functional mobility training;Therapeutic activities;Therapeutic exercise;Balance training;Neuromuscular re-education;Patient/family education;Manual techniques;Scar mobilization;Energy conservation;Taping   PT Next Visit Plan No further PT due to limited to 3 PT visits from [place]; Self-pay was offered to pt, but pt declined and agreed to discharge from PT with transition to advanced HEP    PT Home Exercise Plan Updated HEP was provided  Recommended Other Services None at this time    Consulted and Agree with Plan of Care Patient;Family member/caregiver   Family Member Consulted Spouse      Problem List Patient Active Problem List   Diagnosis Date Noted  . Primary osteoarthritis of left hip 01/08/2016  . DJD (degenerative joint disease) 03/20/2015  . Primary osteoarthritis of right hip 02/26/2015  . Lumbar spondylosis 10/17/2014  . Lumbago 07/25/2014  . Abnormality of gait 07/25/2014  . Difficulty in walking(719.7) 07/25/2014  . TIA (transient ischemic attack) 03/09/2013  . Cocaine abuse 03/09/2013  . Accelerated hypertension 03/09/2013  . Other and unspecified hyperlipidemia 03/09/2013  . Current smoker 03/09/2013    Garen Lah, PT, DPT  02/27/2016, 7:37 PM  [place], [place], [address] Phone: [phone]   Fax:   [phone]  Name: AADITYA LETIZIA MRN: [id number] Date of Birth: 1961/02/15

## 2016-03-03 ENCOUNTER — Encounter (HOSPITAL_COMMUNITY): Payer: Self-pay | Admitting: Physical Therapy

## 2016-03-05 ENCOUNTER — Ambulatory Visit (HOSPITAL_COMMUNITY): Payer: Self-pay | Admitting: Physical Therapy

## 2016-03-07 ENCOUNTER — Encounter (HOSPITAL_COMMUNITY): Payer: Self-pay

## 2016-03-10 ENCOUNTER — Encounter (HOSPITAL_COMMUNITY): Payer: Self-pay | Admitting: Physical Therapy

## 2016-03-12 ENCOUNTER — Ambulatory Visit (HOSPITAL_COMMUNITY): Payer: Self-pay | Admitting: Physical Therapy

## 2016-03-14 ENCOUNTER — Encounter (HOSPITAL_COMMUNITY): Payer: Self-pay

## 2016-05-16 ENCOUNTER — Other Ambulatory Visit: Payer: Self-pay | Admitting: Orthopedic Surgery

## 2016-05-16 DIAGNOSIS — M545 Low back pain, unspecified: Secondary | ICD-10-CM

## 2016-05-16 DIAGNOSIS — G8929 Other chronic pain: Secondary | ICD-10-CM

## 2016-05-26 ENCOUNTER — Ambulatory Visit
Admission: RE | Admit: 2016-05-26 | Discharge: 2016-05-26 | Disposition: A | Discharge status: Home / Self Care | Payer: Medicaid Other | Source: Ambulatory Visit | Attending: Orthopedic Surgery | Admitting: Orthopedic Surgery

## 2016-05-26 ENCOUNTER — Other Ambulatory Visit: Payer: Self-pay | Admitting: Orthopedic Surgery

## 2016-05-26 DIAGNOSIS — M545 Low back pain: Principal | ICD-10-CM

## 2016-05-26 DIAGNOSIS — G8929 Other chronic pain: Secondary | ICD-10-CM

## 2016-05-26 MED ORDER — METHYLPREDNISOLONE ACETATE 40 MG/ML INJ SUSP (RADIOLOG
120.0000 mg | Freq: Once | INTRAMUSCULAR | Status: AC
  Administered 2016-05-26: 120 mg via EPIDURAL

## 2016-05-26 MED ORDER — IOPAMIDOL (ISOVUE-M 200) INJECTION 41%
1.0000 mL | Freq: Once | INTRAMUSCULAR | Status: AC
  Administered 2016-05-26: 1 mL via EPIDURAL

## 2016-05-26 NOTE — Discharge Instructions (Signed)

## 2016-09-18 ENCOUNTER — Telehealth: Payer: Self-pay | Admitting: Vascular Surgery

## 2016-09-18 NOTE — Telephone Encounter (Signed)
NO VM on either # mailing letter for OV 11/05/16

## 2016-09-18 NOTE — Telephone Encounter (Signed)
-----   Message from Gregery Na, RN sent at 09/17/2016 12:02 PM EDT ----- Regarding: OV with TFE Raymond Barrera is scheduled for L4-S1 ALIF with Drs. Early / Dumonski on 11/12/16. Please schedule OV with Dr. Donnetta Hutching at least two weeks prior to surgery. Also, please have patient bring LS spine films to appt.  Thanks,  Colletta Maryland

## 2016-10-20 ENCOUNTER — Other Ambulatory Visit: Payer: Self-pay | Admitting: Orthopedic Surgery

## 2016-10-21 ENCOUNTER — Other Ambulatory Visit: Payer: Self-pay

## 2016-10-29 ENCOUNTER — Encounter: Payer: Self-pay | Admitting: Vascular Surgery

## 2016-11-04 ENCOUNTER — Encounter: Payer: Self-pay | Admitting: Vascular Surgery

## 2016-11-04 ENCOUNTER — Ambulatory Visit (INDEPENDENT_AMBULATORY_CARE_PROVIDER_SITE_OTHER): Payer: Medicaid Other | Admitting: Vascular Surgery

## 2016-11-04 VITALS — BP 167/114 | HR 85 | Temp 98.4°F | Resp 20 | Ht 77.0 in | Wt 266.0 lb

## 2016-11-04 DIAGNOSIS — M5137 Other intervertebral disc degeneration, lumbosacral region: Secondary | ICD-10-CM | POA: Diagnosis not present

## 2016-11-04 NOTE — Pre-Procedure Instructions (Signed)
SHANDELL PAVELICH  11/04/2016     No Pharmacies Listed   Your procedure is scheduled on Wed, Dec 13 @ 8:30 AM  Report to [facility] Admitting at 6:30 AM  Call this number if you have problems the morning of surgery:  [phone]   Remember:  Do not eat food or drink liquids after midnight.  Take these medicines the morning of surgery with A SIP OF WATER Pain Pill(if needed)\              Stop taking your Aspirin,Fish Oil along with any Vitamins or Herbal Medications a week prior to surgery. No Goody's,BC's,Aleve,Advil,Motrin/,or Ibuprofen.     How to Manage Your Diabetes Before and After Surgery  Why is it important to control my blood sugar before and after surgery? . Improving blood sugar levels before and after surgery helps healing and can limit problems. . A way of improving blood sugar control is eating a healthy diet by: o  Eating less sugar and carbohydrates o  Increasing activity/exercise o  Talking with your doctor about reaching your blood sugar goals . High blood sugars (greater than 180 mg/dL) can raise your risk of infections and slow your recovery, so you will need to focus on controlling your diabetes during the weeks before surgery. . Make sure that the doctor who takes care of your diabetes knows about your planned surgery including the date and location.  How do I manage my blood sugar before surgery? . Check your blood sugar at least 4 times a day, starting 2 days before surgery, to make sure that the level is not too high or low. o Check your blood sugar the morning of your surgery when you wake up and every 2 hours until you get to the Short Stay unit. . If your blood sugar is less than 70 mg/dL, you will need to treat for low blood sugar: o Do not take insulin. o Treat a low blood sugar (less than 70 mg/dL) with  cup of clear juice (cranberry or apple), 4 glucose tablets, OR glucose gel. o Recheck blood sugar in 15 minutes after treatment (to  make sure it is greater than 70 mg/dL). If your blood sugar is not greater than 70 mg/dL on recheck, call [phone] for further instructions. . Report your blood sugar to the short stay nurse when you get to Short Stay.  . If you are admitted to the hospital after surgery: o Your blood sugar will be checked by the staff and you will probably be given insulin after surgery (instead of oral diabetes medicines) to make sure you have good blood sugar levels. o The goal for blood sugar control after surgery is 80-180 mg/dL.              WHAT DO I DO ABOUT MY DIABETES MEDICATION?   Marland Kitchen Do not take oral diabetes medicines (pills) the morning of surgery.      . The day of surgery, do not take other diabetes injectables, including Byetta (exenatide), Bydureon (exenatide ER), Victoza (liraglutide), or Trulicity (dulaglutide).  . If your CBG is greater than 220 mg/dL, you may take  of your sliding scale (correction) dose of insulin.  Other Instructions:          Reviewed and Endorsed by [facility] Patient Education Committee, August 2015   Do not wear jewelry.  Do not wear lotions, powders,colognes, or deoderant.  Men may shave face and neck.  Do not bring valuables to the hospital.  [facility] is not responsible for any belongings or valuables.  Contacts, dentures or bridgework may not be worn into surgery.  Leave your suitcase in the car.  After surgery it may be brought to your room.  For patients admitted to the hospital, discharge time will be determined by your treatment team.  Patients discharged the day of surgery will not be allowed to drive home.    [facility]  Before surgery, you can play an important role.  Because skin is not sterile, your skin needs to be as free of germs as possible.  You can reduce the number of germs on you skin by washing with CHG (chlorahexidine gluconate) soap before surgery.  CHG is  an antiseptic cleaner which kills germs and bonds with the skin to continue killing germs even after washing.  Please DO NOT use if you have an allergy to CHG or antibacterial soaps.  If your skin becomes reddened/irritated stop using the CHG and inform your nurse when you arrive at Short Stay.  Do not shave (including legs and underarms) for at least 48 hours prior to the first CHG shower.  You may shave your face.  Please follow these instructions carefully:   1.  Shower with CHG Soap the night before surgery and the                                morning of Surgery.  2.  If you choose to wash your hair, wash your hair first as usual with your       normal shampoo.  3.  After you shampoo, rinse your hair and body thoroughly to remove the                      Shampoo.  4.  Use CHG as you would any other liquid soap.  You can apply chg directly       to the skin and wash gently with scrungie or a clean washcloth.  5.  Apply the CHG Soap to your body ONLY FROM THE NECK DOWN.        Do not use on open wounds or open sores.  Avoid contact with your eyes,       ears, mouth and genitals (private parts).  Wash genitals (private parts)       with your normal soap.  6.  Wash thoroughly, paying special attention to the area where your surgery        will be performed.  7.  Thoroughly rinse your body with warm water from the neck down.  8.  DO NOT shower/wash with your normal soap after using and rinsing off       the CHG Soap.  9.  Pat yourself dry with a clean towel.            10.  Wear clean pajamas.            11.  Place clean sheets on your bed the night of your first shower and do not        sleep with pets.  Day of Surgery  Do not apply any lotions/deoderants the morning of surgery.  Please wear clean clothes to the hospital/surgery center.    Please read over the following fact sheets that you were  given. Pain Booklet, Coughing and Deep Breathing, MRSA Information and Surgical Site  Infection Prevention

## 2016-11-04 NOTE — Progress Notes (Signed)
[facility] [place]  Patient name: Raymond Barrera MRN: [id number] DOB: Jun 03, 1961 Sex: male  REASON FOR CONSULT: Discuss plan for anterior exposure for L4-5 and L5-S1 disc surgery with Dr.Dumonski  HPI: Raymond Barrera is a 55 y.o. male, who is here today discuss my role for anterior exposure. He is here today with his wife. She is very involved with discussions regarding his health issue as well. He has progressive difficulty regarding his back. He has somewhat of a confusing picture. He also had severe hip arthritic pain as well. He underwent staged bilateral hip replacement on the right and then on the left and 2015. This was following disc surgery from a posterior approach in 2015 as well. He continues to have pain. He describes this is spasm and burning sensation in his low back and extending mainly into his anterior thighs. This is a occurs all the time and is not related to activity. He has had several falls related to the pain. He has had a series of conservative treatments to include injections in his back with no relief and is now been recommended that he undergo repeat disc surgery with anterior approach. His wife has questions regarding the need for other posterior approach as well opacified to discuss them with the spine surgeon.  Past Medical History:  Diagnosis Date  . Anxiety    takes Valium daily as needed  . Arthritis   . Back pain    hx of buldging disc  . Diabetes mellitus without complication ([place])    takes Metformin daily  . Headache    migraines  . High cholesterol    takes Zocor daily  . Hypertension    takes Benazepril and HCTZ  daily  . Joint pain   . Joint swelling   . Memory impairment    occassional - from stroke  . Myocardial infarction 1987  . Pneumonia    hx of-80's  . PONV (postoperative nausea and vomiting)    took a while for him to wake up after previous anesthesia  . Shortness of breath  dyspnea    with exertion  . Slurred speech   . Stroke ([place]) 08/2013  . TIA (transient ischemic attack) 2014   x 7   . Urinary frequency     History reviewed. No pertinent family history.  SOCIAL HISTORY: Social History   Social History  . Marital status: Married    Spouse name: N/A  . Number of children: N/A  . Years of education: N/A   Occupational History  . Not on file.   Social History Main Topics  . Smoking status: Current Every Day Smoker    Packs/day: 0.50    Years: 47.00    Types: Cigarettes  . Smokeless tobacco: Never Used  . Alcohol use No     Comment: no alcohol in 2 yrs  . Drug use:     Types: Cocaine     Comment: many yrs ago.  Marland Kitchen Sexual activity: No   Other Topics Concern  . Not on file   Social History Narrative  . No narrative on file    Allergies  Allergen Reactions  . Poison Ivy Extract [Poison Ivy Extract] Itching and Rash    Current Outpatient Prescriptions  Medication Sig Dispense Refill  . aspirin 325 MG tablet Take 1 tablet (325 mg total) by mouth daily. 30 tablet 0  . benazepril (LOTENSIN) 20 MG tablet Take 20 mg by mouth daily.    Marland Kitchen  FOLIC ACID PO Take 1 tablet by mouth daily.    . IRON PO Take 1 tablet by mouth daily.    . metFORMIN (GLUCOPHAGE) 500 MG tablet Take 500 mg by mouth 2 (two) times daily with a meal.    . Multiple Vitamin (MULTIVITAMIN WITH MINERALS) TABS tablet Take 1 tablet by mouth daily.    . Omega-3 Fatty Acids (FISH OIL PO) Take 1 capsule by mouth daily.    Marland Kitchen oxyCODONE (ROXICODONE) 15 MG immediate release tablet Take 15 mg by mouth every 6 (six) hours as needed for pain.    . simvastatin (ZOCOR) 40 MG tablet Take 40 mg by mouth daily.    Marland Kitchen VITAMIN E PO Take 1 tablet by mouth daily.     No current facility-administered medications for this visit.     REVIEW OF SYSTEMS:  [X]  denotes positive finding, [ ]  denotes negative finding Cardiac  Comments:  Chest pain or chest pressure:    Shortness of breath upon  exertion:    Short of breath when lying flat:    Irregular heart rhythm:        Vascular    Pain in calf, thigh, or hip brought on by ambulation: x Neurogenic   Pain in feet at night that wakes you up from your sleep:  x Neurogenic   Blood clot in your veins:    Leg swelling:         Pulmonary    Oxygen at home:    Productive cough:     Wheezing:         Neurologic    Sudden weakness in arms or legs:     Sudden numbness in arms or legs:     Sudden onset of difficulty speaking or slurred speech:    Temporary loss of vision in one eye:     Problems with dizziness:         Gastrointestinal    Blood in stool:     Vomited blood:         Genitourinary    Burning when urinating:     Blood in urine:        Psychiatric    Major depression:         Hematologic    Bleeding problems:    Problems with blood clotting too easily:        Skin    Rashes or ulcers:        Constitutional    Fever or chills:      PHYSICAL EXAM: Vitals:   11/04/16 0922 11/04/16 0926  BP: (!) 169/114 (!) 167/114  Pulse: 85   Resp: 20   Temp: 98.4 F (36.9 C)   TempSrc: Oral   SpO2: 97%   Weight: 266 lb (120.7 kg)   Height: 6\' 5"  (1.956 m)     GENERAL: The patient is a well-nourished male, in no acute distress. The vital signs are documented above. CARDIOVASCULAR: 2+ radial 2+ femoral and 2+ dorsalis pedis pulses bilaterally  PULMONARY: There is good air exchange  ABDOMEN: Soft and non-tender  MUSCULOSKELETAL: There are no major deformities or cyanosis. NEUROLOGIC: No focal weakness or paresthesias are detected. SKIN: There are no ulcers or rashes noted. PSYCHIATRIC: The patient has a normal affect.    MEDICAL ISSUES: A long discussion regarding my role in exposure. Explain mobilization of intraperitoneal contents, left ureter, arterial and venous structures overlying the spine. Also explained the potential for injury of all these. Also discussed possibility  of retrograde ejaculation.  The wife specifically had questions regarding this in the literature that she had been provided. They wish to proceed as scheduled for anterior exposure for L4-5 and L5-S1 disc surgery  Rosetta Posner, MD [facility] Tel [phone] Pager [phone]

## 2016-11-05 ENCOUNTER — Encounter (HOSPITAL_COMMUNITY): Payer: Self-pay | Admitting: Emergency Medicine

## 2016-11-05 ENCOUNTER — Encounter (HOSPITAL_COMMUNITY)
Admission: RE | Admit: 2016-11-05 | Discharge: 2016-11-05 | Disposition: A | Discharge status: Home / Self Care | Payer: Medicaid Other | Source: Ambulatory Visit | Attending: Orthopedic Surgery | Admitting: Orthopedic Surgery

## 2016-11-05 ENCOUNTER — Encounter (HOSPITAL_COMMUNITY): Payer: Self-pay

## 2016-11-05 ENCOUNTER — Ambulatory Visit (HOSPITAL_COMMUNITY)
Admission: RE | Admit: 2016-11-05 | Discharge: 2016-11-05 | Disposition: A | Discharge status: Home / Self Care | Payer: Medicaid Other | Source: Ambulatory Visit | Attending: Orthopedic Surgery | Admitting: Orthopedic Surgery

## 2016-11-05 DIAGNOSIS — Z01812 Encounter for preprocedural laboratory examination: Secondary | ICD-10-CM | POA: Diagnosis present

## 2016-11-05 DIAGNOSIS — Z01818 Encounter for other preprocedural examination: Secondary | ICD-10-CM

## 2016-11-05 DIAGNOSIS — Z01810 Encounter for preprocedural cardiovascular examination: Secondary | ICD-10-CM | POA: Diagnosis not present

## 2016-11-05 DIAGNOSIS — M5416 Radiculopathy, lumbar region: Secondary | ICD-10-CM | POA: Diagnosis not present

## 2016-11-05 LAB — CBC WITH DIFFERENTIAL/PLATELET
BASOS PCT: 0 %
Basophils Absolute: 0 10*3/uL (ref 0.0–0.1)
EOS ABS: 0.1 10*3/uL (ref 0.0–0.7)
Eosinophils Relative: 1 %
HEMATOCRIT: 45.7 % (ref 39.0–52.0)
Hemoglobin: 15.5 g/dL (ref 13.0–17.0)
Lymphocytes Relative: 17 %
Lymphs Abs: 1.7 10*3/uL (ref 0.7–4.0)
MCH: 29.8 pg (ref 26.0–34.0)
MCHC: 33.9 g/dL (ref 30.0–36.0)
MCV: 87.9 fL (ref 78.0–100.0)
MONOS PCT: 5 %
Monocytes Absolute: 0.5 10*3/uL (ref 0.1–1.0)
NEUTROS ABS: 7.7 10*3/uL (ref 1.7–7.7)
NEUTROS PCT: 77 %
Platelets: 250 10*3/uL (ref 150–400)
RBC: 5.2 MIL/uL (ref 4.22–5.81)
RDW: 14.1 % (ref 11.5–15.5)
WBC: 10 10*3/uL (ref 4.0–10.5)

## 2016-11-05 LAB — GLUCOSE, CAPILLARY: Glucose-Capillary: 239 mg/dL — ABNORMAL HIGH (ref 65–99)

## 2016-11-05 LAB — COMPREHENSIVE METABOLIC PANEL
ALBUMIN: 3.7 g/dL (ref 3.5–5.0)
ALK PHOS: 124 U/L (ref 38–126)
ALT: 52 U/L (ref 17–63)
ANION GAP: 9 (ref 5–15)
AST: 29 U/L (ref 15–41)
BILIRUBIN TOTAL: 0.6 mg/dL (ref 0.3–1.2)
BUN: 15 mg/dL (ref 6–20)
CALCIUM: 10.3 mg/dL (ref 8.9–10.3)
CO2: 30 mmol/L (ref 22–32)
CREATININE: 1.19 mg/dL (ref 0.61–1.24)
Chloride: 101 mmol/L (ref 101–111)
GFR calc Af Amer: >60 mL/min (ref >60)
GFR calc non Af Amer: >60 mL/min (ref >60)
GLUCOSE: 224 mg/dL — AB (ref 65–99)
Potassium: 4.2 mmol/L (ref 3.5–5.1)
SODIUM: 140 mmol/L (ref 135–145)
TOTAL PROTEIN: 7.5 g/dL (ref 6.5–8.1)

## 2016-11-05 LAB — PROTIME-INR
INR: 0.96
Prothrombin Time: 12.8 seconds (ref 11.4–15.2)

## 2016-11-05 LAB — TYPE AND SCREEN
ABO/RH(D): O POS
ANTIBODY SCREEN: NEGATIVE

## 2016-11-05 LAB — URINALYSIS, ROUTINE W REFLEX MICROSCOPIC
BACTERIA UA: NONE SEEN
Bilirubin Urine: NEGATIVE
GLUCOSE, UA: NEGATIVE mg/dL
Hgb urine dipstick: NEGATIVE
KETONES UR: NEGATIVE mg/dL
Leukocytes, UA: NEGATIVE
Nitrite: NEGATIVE
PROTEIN: 100 mg/dL — AB
SQUAMOUS EPITHELIAL / LPF: NONE SEEN
Specific Gravity, Urine: 1.017 (ref 1.005–1.030)
pH: 5 (ref 5.0–8.0)

## 2016-11-05 LAB — SURGICAL PCR SCREEN
MRSA, PCR: NEGATIVE
Staphylococcus aureus: NEGATIVE

## 2016-11-05 LAB — APTT: aPTT: 35 seconds (ref 24–36)

## 2016-11-05 NOTE — Progress Notes (Addendum)
Pt. counselled about coorelation to success with surgery relative to BP & blood sugar. Pt. Saw PCP today, given Rx for Norvasc & Labetalol, will start later today. Pt. Unsure about instructions to continue benazepril- wife agrees to call PCP & will find out & call [place] with the final instructions from PCP.  BP still remains elevated- 153/106 at end of PAT visit.  Durel Salts, consulted.  Pt. Will be counselled to remain vigilant with med. schedule & to be informed from PCP about the intentions with continuing or not on the Benazapril .

## 2016-11-05 NOTE — Progress Notes (Signed)
   11/05/16 1257  OBSTRUCTIVE SLEEP APNEA  Have you ever been diagnosed with sleep apnea through a sleep study? No  Do you snore loudly (loud enough to be heard through closed doors)?  1  Do you often feel tired, fatigued, or sleepy during the daytime (such as falling asleep during driving or talking to someone)? 1 (gets sleepy during the day esp. with oxycodone but w/o it as well.)  Has anyone observed you stop breathing during your sleep? 1  Do you have, or are you being treated for high blood pressure? 1  BMI more than 35 kg/m2? 0  Age > 50 (1-yes) 1  Neck circumference greater than:Male 16 inches or larger, Male 17inches or larger? 1  Male Gender (Yes=1) 1  Obstructive Sleep Apnea Score 7  Score 5 or greater  Results sent to PCP

## 2016-11-05 NOTE — Progress Notes (Signed)
Pt. Followed by Dr. Cindie Laroche for PCP, pt. Reports that he suffered an MI in the 80's, states that he can't remember what treatment followed but reports that he is not followed by a cardiologist at present. ECHO done in 2014.

## 2016-11-11 MED ORDER — CEFAZOLIN SODIUM 10 G IJ SOLR
3.0000 g | INTRAMUSCULAR | Status: DC
  Filled 2016-11-11: qty 3000

## 2016-11-11 MED ORDER — DEXTROSE 5 % IV SOLN
3.0000 g | INTRAVENOUS | Status: DC
  Filled 2016-11-11: qty 3000

## 2016-11-11 NOTE — Progress Notes (Signed)
Pt. Wife called back & reports that she has confirmed with MD, they instruct that all 3 antihypertensives should be taken.

## 2016-11-12 ENCOUNTER — Inpatient Hospital Stay (HOSPITAL_COMMUNITY): Admission: RE | Admit: 2016-11-12 | Payer: Medicaid Other | Source: Ambulatory Visit | Admitting: Orthopedic Surgery

## 2016-11-12 ENCOUNTER — Encounter (HOSPITAL_COMMUNITY): Admission: RE | Payer: Self-pay | Source: Ambulatory Visit

## 2016-11-12 SURGERY — ANTERIOR LUMBAR FUSION 2 LEVELS
Anesthesia: General

## 2016-11-13 ENCOUNTER — Inpatient Hospital Stay: Admit: 2016-11-13 | Payer: Medicaid Other | Admitting: Orthopedic Surgery

## 2016-11-13 SURGERY — POSTERIOR LUMBAR FUSION 2 LEVEL
Anesthesia: General

## 2017-02-01 ENCOUNTER — Emergency Department (HOSPITAL_COMMUNITY): Payer: Medicaid Other

## 2017-02-01 ENCOUNTER — Inpatient Hospital Stay (HOSPITAL_COMMUNITY)
Admission: EM | Admit: 2017-02-01 | Discharge: 2017-02-05 | DRG: 065 | Disposition: A | Discharge status: Home / Self Care | Payer: Medicaid Other | Attending: Family Medicine | Admitting: Family Medicine

## 2017-02-01 ENCOUNTER — Encounter (HOSPITAL_COMMUNITY): Payer: Self-pay | Admitting: Emergency Medicine

## 2017-02-01 DIAGNOSIS — Z888 Allergy status to other drugs, medicaments and biological substances status: Secondary | ICD-10-CM

## 2017-02-01 DIAGNOSIS — I10 Essential (primary) hypertension: Secondary | ICD-10-CM

## 2017-02-01 DIAGNOSIS — G8191 Hemiplegia, unspecified affecting right dominant side: Secondary | ICD-10-CM | POA: Diagnosis present

## 2017-02-01 DIAGNOSIS — Z8673 Personal history of transient ischemic attack (TIA), and cerebral infarction without residual deficits: Secondary | ICD-10-CM | POA: Diagnosis not present

## 2017-02-01 DIAGNOSIS — E1159 Type 2 diabetes mellitus with other circulatory complications: Secondary | ICD-10-CM

## 2017-02-01 DIAGNOSIS — F419 Anxiety disorder, unspecified: Secondary | ICD-10-CM | POA: Diagnosis present

## 2017-02-01 DIAGNOSIS — E785 Hyperlipidemia, unspecified: Secondary | ICD-10-CM | POA: Diagnosis present

## 2017-02-01 DIAGNOSIS — W01119A Fall on same level from slipping, tripping and stumbling with subsequent striking against unspecified sharp object, initial encounter: Secondary | ICD-10-CM

## 2017-02-01 DIAGNOSIS — F17200 Nicotine dependence, unspecified, uncomplicated: Secondary | ICD-10-CM | POA: Diagnosis not present

## 2017-02-01 DIAGNOSIS — Z79899 Other long term (current) drug therapy: Secondary | ICD-10-CM | POA: Diagnosis not present

## 2017-02-01 DIAGNOSIS — M5440 Lumbago with sciatica, unspecified side: Secondary | ICD-10-CM

## 2017-02-01 DIAGNOSIS — Z96643 Presence of artificial hip joint, bilateral: Secondary | ICD-10-CM | POA: Diagnosis present

## 2017-02-01 DIAGNOSIS — Z7984 Long term (current) use of oral hypoglycemic drugs: Secondary | ICD-10-CM | POA: Diagnosis not present

## 2017-02-01 DIAGNOSIS — Z791 Long term (current) use of non-steroidal anti-inflammatories (NSAID): Secondary | ICD-10-CM

## 2017-02-01 DIAGNOSIS — F17210 Nicotine dependence, cigarettes, uncomplicated: Secondary | ICD-10-CM | POA: Diagnosis present

## 2017-02-01 DIAGNOSIS — E1165 Type 2 diabetes mellitus with hyperglycemia: Secondary | ICD-10-CM | POA: Diagnosis present

## 2017-02-01 DIAGNOSIS — W19XXXA Unspecified fall, initial encounter: Secondary | ICD-10-CM

## 2017-02-01 DIAGNOSIS — I6789 Other cerebrovascular disease: Secondary | ICD-10-CM | POA: Diagnosis not present

## 2017-02-01 DIAGNOSIS — E1169 Type 2 diabetes mellitus with other specified complication: Secondary | ICD-10-CM

## 2017-02-01 DIAGNOSIS — F1410 Cocaine abuse, uncomplicated: Secondary | ICD-10-CM

## 2017-02-01 DIAGNOSIS — Z7982 Long term (current) use of aspirin: Secondary | ICD-10-CM

## 2017-02-01 DIAGNOSIS — E7800 Pure hypercholesterolemia, unspecified: Secondary | ICD-10-CM | POA: Diagnosis present

## 2017-02-01 DIAGNOSIS — E78019 Familial hypercholesterolemia, unspecified: Secondary | ICD-10-CM

## 2017-02-01 DIAGNOSIS — R29810 Facial weakness: Secondary | ICD-10-CM | POA: Diagnosis present

## 2017-02-01 DIAGNOSIS — I252 Old myocardial infarction: Secondary | ICD-10-CM | POA: Diagnosis not present

## 2017-02-01 DIAGNOSIS — E0800 Diabetes mellitus due to underlying condition with hyperosmolarity without nonketotic hyperglycemic-hyperosmolar coma (NKHHC): Secondary | ICD-10-CM

## 2017-02-01 DIAGNOSIS — I639 Cerebral infarction, unspecified: Principal | ICD-10-CM

## 2017-02-01 DIAGNOSIS — M47816 Spondylosis without myelopathy or radiculopathy, lumbar region: Secondary | ICD-10-CM

## 2017-02-01 DIAGNOSIS — E119 Type 2 diabetes mellitus without complications: Secondary | ICD-10-CM

## 2017-02-01 DIAGNOSIS — R269 Unspecified abnormalities of gait and mobility: Secondary | ICD-10-CM

## 2017-02-01 DIAGNOSIS — E7801 Familial hypercholesterolemia: Secondary | ICD-10-CM

## 2017-02-01 LAB — COMPREHENSIVE METABOLIC PANEL
ALT: 21 U/L (ref 17–63)
ANION GAP: 9 (ref 5–15)
AST: 22 U/L (ref 15–41)
Albumin: 3.7 g/dL (ref 3.5–5.0)
Alkaline Phosphatase: 83 U/L (ref 38–126)
BUN: 11 mg/dL (ref 6–20)
CO2: 26 mmol/L (ref 22–32)
Calcium: 9.7 mg/dL (ref 8.9–10.3)
Chloride: 106 mmol/L (ref 101–111)
Creatinine, Ser: 1.14 mg/dL (ref 0.61–1.24)
GFR calc non Af Amer: >60 mL/min (ref >60)
Glucose, Bld: 212 mg/dL — ABNORMAL HIGH (ref 65–99)
Potassium: 3.7 mmol/L (ref 3.5–5.1)
Sodium: 141 mmol/L (ref 135–145)
TOTAL PROTEIN: 7.4 g/dL (ref 6.5–8.1)
Total Bilirubin: 0.8 mg/dL (ref 0.3–1.2)

## 2017-02-01 LAB — CBC
HCT: 43.3 % (ref 39.0–52.0)
Hemoglobin: 14.6 g/dL (ref 13.0–17.0)
MCH: 29.5 pg (ref 26.0–34.0)
MCHC: 33.7 g/dL (ref 30.0–36.0)
MCV: 87.5 fL (ref 78.0–100.0)
PLATELETS: 222 10*3/uL (ref 150–400)
RBC: 4.95 MIL/uL (ref 4.22–5.81)
RDW: 14.5 % (ref 11.5–15.5)
WBC: 8.9 10*3/uL (ref 4.0–10.5)

## 2017-02-01 LAB — RAPID URINE DRUG SCREEN, HOSP PERFORMED
Amphetamines: NOT DETECTED
BARBITURATES: NOT DETECTED
BENZODIAZEPINES: NOT DETECTED
COCAINE: POSITIVE — AB
OPIATES: NOT DETECTED
Tetrahydrocannabinol: NOT DETECTED

## 2017-02-01 LAB — URINALYSIS, ROUTINE W REFLEX MICROSCOPIC
BACTERIA UA: NONE SEEN
Bilirubin Urine: NEGATIVE
Glucose, UA: NEGATIVE mg/dL
Hgb urine dipstick: NEGATIVE
Ketones, ur: NEGATIVE mg/dL
Leukocytes, UA: NEGATIVE
Nitrite: NEGATIVE
Protein, ur: 30 mg/dL — AB
SPECIFIC GRAVITY, URINE: 1.009 (ref 1.005–1.030)
pH: 7 (ref 5.0–8.0)

## 2017-02-01 LAB — DIFFERENTIAL
Basophils Absolute: 0 10*3/uL (ref 0.0–0.1)
Basophils Relative: 0 %
EOS PCT: 1 %
Eosinophils Absolute: 0.1 10*3/uL (ref 0.0–0.7)
LYMPHS PCT: 20 %
Lymphs Abs: 1.8 10*3/uL (ref 0.7–4.0)
MONO ABS: 0.5 10*3/uL (ref 0.1–1.0)
Monocytes Relative: 6 %
Neutro Abs: 6.5 10*3/uL (ref 1.7–7.7)
Neutrophils Relative %: 73 %

## 2017-02-01 LAB — PROTIME-INR
INR: 0.99
PROTHROMBIN TIME: 13.1 s (ref 11.4–15.2)

## 2017-02-01 LAB — APTT: aPTT: 32 seconds (ref 24–36)

## 2017-02-01 LAB — GLUCOSE, CAPILLARY
GLUCOSE-CAPILLARY: 127 mg/dL — AB (ref 65–99)
Glucose-Capillary: 137 mg/dL — ABNORMAL HIGH (ref 65–99)

## 2017-02-01 LAB — CBG MONITORING, ED: GLUCOSE-CAPILLARY: 232 mg/dL — AB (ref 65–99)

## 2017-02-01 LAB — ETHANOL

## 2017-02-01 MED ORDER — ASPIRIN 325 MG PO TABS
325.0000 mg | ORAL_TABLET | Freq: Every day | ORAL | Status: DC
  Administered 2017-02-02 – 2017-02-05 (×4): 325 mg via ORAL
  Filled 2017-02-01 (×4): qty 1

## 2017-02-01 MED ORDER — SENNOSIDES-DOCUSATE SODIUM 8.6-50 MG PO TABS: 1.0000 | ORAL_TABLET | Freq: Every evening | ORAL | Status: DC | PRN

## 2017-02-01 MED ORDER — OMEGA-3-ACID ETHYL ESTERS 1 G PO CAPS
1.0000 g | ORAL_CAPSULE | Freq: Every day | ORAL | Status: DC
  Administered 2017-02-02 – 2017-02-05 (×4): 1 g via ORAL
  Filled 2017-02-01 (×4): qty 1

## 2017-02-01 MED ORDER — INSULIN ASPART 100 UNIT/ML ~~LOC~~ SOLN
0.0000 [IU] | SUBCUTANEOUS | Status: DC
  Administered 2017-02-01 – 2017-02-02 (×3): 1 [IU] via SUBCUTANEOUS
  Administered 2017-02-03: 3 [IU] via SUBCUTANEOUS
  Administered 2017-02-03: 2 [IU] via SUBCUTANEOUS
  Administered 2017-02-03 – 2017-02-04 (×5): 1 [IU] via SUBCUTANEOUS
  Administered 2017-02-04: 2 [IU] via SUBCUTANEOUS
  Administered 2017-02-05: 1 [IU] via SUBCUTANEOUS
  Administered 2017-02-05: 2 [IU] via SUBCUTANEOUS
  Administered 2017-02-05: 1 [IU] via SUBCUTANEOUS

## 2017-02-01 MED ORDER — ASPIRIN 300 MG RE SUPP: 300.0000 mg | Freq: Every day | RECTAL | Status: DC

## 2017-02-01 MED ORDER — ACETAMINOPHEN 160 MG/5ML PO SOLN: 650.0000 mg | ORAL | Status: DC | PRN

## 2017-02-01 MED ORDER — ACETAMINOPHEN 325 MG PO TABS
650.0000 mg | ORAL_TABLET | ORAL | Status: DC | PRN
  Administered 2017-02-02 (×2): 650 mg via ORAL
  Filled 2017-02-01 (×3): qty 2

## 2017-02-01 MED ORDER — ACETAMINOPHEN 650 MG RE SUPP: 650.0000 mg | RECTAL | Status: DC | PRN

## 2017-02-01 MED ORDER — SODIUM CHLORIDE 0.9 % IV SOLN
INTRAVENOUS | Status: DC
  Administered 2017-02-01 – 2017-02-04 (×2): via INTRAVENOUS

## 2017-02-01 MED ORDER — HEPARIN SODIUM (PORCINE) 5000 UNIT/ML IJ SOLN
5000.0000 [IU] | Freq: Three times a day (TID) | INTRAMUSCULAR | Status: DC
  Administered 2017-02-01 – 2017-02-02 (×3): 5000 [IU] via SUBCUTANEOUS
  Filled 2017-02-01 (×3): qty 1

## 2017-02-01 MED ORDER — STROKE: EARLY STAGES OF RECOVERY BOOK
Freq: Once | Status: AC
  Administered 2017-02-02: 10:00:00
  Filled 2017-02-01: qty 1

## 2017-02-01 MED ORDER — SIMVASTATIN 20 MG PO TABS
40.0000 mg | ORAL_TABLET | Freq: Every day | ORAL | Status: DC
  Administered 2017-02-02 – 2017-02-05 (×4): 40 mg via ORAL
  Filled 2017-02-01 (×4): qty 2

## 2017-02-01 NOTE — ED Notes (Signed)
Report to Lisa, RN

## 2017-02-01 NOTE — ED Notes (Signed)
Pt with visitors at bedside flexing and extending L leg [name] facial droop appears less pronounced

## 2017-02-01 NOTE — ED Provider Notes (Signed)
[facility] DEPT Provider Note   CSN: [id number] Arrival date & time: 02/01/17  1254     History   Chief Complaint Chief Complaint  Patient presents with  . Stroke Symptoms    HPI Raymond Barrera is a 56 y.o. male.  HPI  The patient is a 56 year old male, history of anxiety, diabetes, hypertension, high cholesterol as well as a history of 7 transient ischemic attacks in the past and cocaine abuse in the past. He presents to the hospital with a complaint of progressive difficulty speaking, right-sided facial droop and a progressive right upper extremity weakness which started on Friday, this was approximately 48 hours ago. It is gradually worsening and is now become severe. He is having some difficulty with ambulation, cannot use his right upper extremity and is having difficulty speaking. There is no fevers chills nausea vomiting or diarrhea. No chest pain palpitations or shortness of breath. He does report that he was in an argument with his significant other prior to this starting which then he developed a headache and had a high blood pressure episode. He has been taking aspirin for the last few days as his wife has been giving him something and he was refusing to come to the hospital until today when his symptoms became more severe.  Past Medical History:  Diagnosis Date  . Anxiety    takes Valium daily as needed  . Arthritis   . Back pain    hx of buldging disc  . Diabetes mellitus without complication ([place])    takes Metformin daily  . Headache    migraines  . High cholesterol    takes Zocor daily  . Hypertension    takes Benazepril and HCTZ  daily  . Joint pain   . Joint swelling   . Memory impairment    occassional - from stroke  . Myocardial infarction 1987  . Pneumonia    hx of-80's  . PONV (postoperative nausea and vomiting)    took a while for him to wake up after previous anesthesia  . Shortness of breath dyspnea    with exertion  . Slurred speech   .  Stroke ([place]) 08/2013  . TIA (transient ischemic attack) 2014   x 7   . Urinary frequency     Patient Active Problem List   Diagnosis Date Noted  . Primary osteoarthritis of left hip 01/08/2016  . DJD (degenerative joint disease) 03/20/2015  . Primary osteoarthritis of right hip 02/26/2015  . Lumbar spondylosis 10/17/2014  . Lumbago 07/25/2014  . Abnormality of gait 07/25/2014  . Difficulty in walking(719.7) 07/25/2014  . TIA (transient ischemic attack) 03/09/2013  . Cocaine abuse 03/09/2013  . Accelerated hypertension 03/09/2013  . Other and unspecified hyperlipidemia 03/09/2013  . Current smoker 03/09/2013    Past Surgical History:  Procedure Laterality Date  . ANKLE SURGERY  2008   left ankle-otif-[facility]  . BACK SURGERY    . JOINT REPLACEMENT     both hips replaced   . LUMBAR LAMINECTOMY/DECOMPRESSION MICRODISCECTOMY Right 10/17/2014   Procedure: LUMBAR LAMINECTOMY/DECOMPRESSION MICRODISCECTOMY 2 LEVELS;  Surgeon: [name], MD;  Location: [facility];  Service: Neurosurgery;  Laterality: Right;  Right L45 L5S1 laminectomy and foraminotomy  . MASS EXCISION  09/13/2012   Procedure: EXCISION MASS;  Surgeon: Jamesetta So, MD;  Location: [facility];  Service: General;  Laterality: N/A;  . TONSILLECTOMY    . TOTAL HIP ARTHROPLASTY Right 03/20/2015  . TOTAL HIP ARTHROPLASTY Right 03/20/2015  Procedure: TOTAL HIP ARTHROPLASTY ANTERIOR APPROACH;  Surgeon: Renette Butters, MD;  Location: [facility];  Service: Orthopedics;  Laterality: Right;  . TOTAL HIP ARTHROPLASTY Left 01/08/2016   Procedure: TOTAL HIP ARTHROPLASTY ANTERIOR APPROACH;  Surgeon: Renette Butters, MD;  Location: [facility];  Service: Orthopedics;  Laterality: Left;       Home Medications    Prior to Admission medications   Medication Sig Start Date End Date Taking? Authorizing Provider  amLODipine (NORVASC) 10 MG tablet Take 10 mg by mouth daily.    Historical Provider, MD  aspirin 325 MG tablet Take 1 tablet (325  mg total) by mouth daily. 01/08/16   Brittney Claiborne Billings, PA-C  benazepril (LOTENSIN) 20 MG tablet Take 20 mg by mouth daily.    Historical Provider, MD  FOLIC ACID PO Take 1 tablet by mouth daily.    Historical Provider, MD  IRON PO Take 1 tablet by mouth daily.    Historical Provider, MD  labetalol (NORMODYNE) 200 MG tablet Take 200 mg by mouth 2 (two) times daily.    Historical Provider, MD  metFORMIN (GLUCOPHAGE) 500 MG tablet Take 500 mg by mouth 2 (two) times daily with a meal.    Historical Provider, MD  Multiple Vitamin (MULTIVITAMIN WITH MINERALS) TABS tablet Take 1 tablet by mouth daily.    Historical Provider, MD  naproxen sodium (ANAPROX) 220 MG tablet Take 440 mg by mouth 2 (two) times daily with a meal.    Historical Provider, MD  Omega-3 Fatty Acids (FISH OIL PO) Take 1 capsule by mouth daily.    Historical Provider, MD  oxyCODONE (ROXICODONE) 15 MG immediate release tablet Take 15 mg by mouth every 6 (six) hours as needed for pain.    Historical Provider, MD  simvastatin (ZOCOR) 40 MG tablet Take 40 mg by mouth daily.    Historical Provider, MD  VITAMIN E PO Take 1 tablet by mouth daily.    Historical Provider, MD    Family History History reviewed. No pertinent family history.  Social History Social History  Substance Use Topics  . Smoking status: Current Every Day Smoker    Packs/day: 0.50    Years: 47.00    Types: Cigarettes  . Smokeless tobacco: Never Used  . Alcohol use No     Comment: quit 2012     Allergies   Poison ivy extract [poison ivy extract]   Review of Systems Review of Systems  All other systems reviewed and are negative.    Physical Exam Updated Vital Signs BP (!) 170/125 (BP Location: Right Arm)   Pulse 77   Temp 98 F (36.7 C)   Resp 18   Ht 6\' 6"  (1.981 m)   Wt 265 lb (120.2 kg)   SpO2 100%   BMI 30.62 kg/m   Physical Exam  Constitutional: He appears well-developed and well-nourished. He appears distressed.  HENT:  Head:  Normocephalic and atraumatic.  Mouth/Throat: Oropharynx is clear and moist. No oropharyngeal exudate.  Right-sided facial droop  Eyes: Conjunctivae and EOM are normal. Pupils are equal, round, and reactive to light. Right eye exhibits no discharge. Left eye exhibits no discharge. No scleral icterus.  Neck: Normal range of motion. Neck supple. No JVD present. No thyromegaly present.  Cardiovascular: Normal rate, regular rhythm, normal heart sounds and intact distal pulses.  Exam reveals no gallop and no friction rub.   No murmur heard. No carotid bruit  Pulmonary/Chest: Effort normal and breath sounds normal. No respiratory distress. He  has no wheezes. He has no rales.  Abdominal: Soft. Bowel sounds are normal. He exhibits no distension and no mass. There is no tenderness.  Musculoskeletal: Normal range of motion. He exhibits no edema or tenderness.  Lymphadenopathy:    He has no cervical adenopathy.  Neurological: He is alert. A cranial nerve deficit is present. Coordination abnormal.  The patient has some dysarthria, right upper extremity weakness with some dysmetria of the right upper extremity as well as right-sided facial droop. The rest of his neurologic exam is unremarkable. He has some limitations to his left lower extremity secondary to chronic pain.  Skin: Skin is warm and dry. No rash noted. No erythema.  Psychiatric: He has a normal mood and affect. His behavior is normal.  Nursing note and vitals reviewed.    ED Treatments / Results  Labs (all labs ordered are listed, but only abnormal results are displayed) Labs Reviewed  COMPREHENSIVE METABOLIC PANEL - Abnormal; Notable for the following:       Result Value   Glucose, Bld 212 (*)    All other components within normal limits  CBG MONITORING, ED - Abnormal; Notable for the following:    Glucose-Capillary 232 (*)    All other components within normal limits  ETHANOL  PROTIME-INR  APTT  CBC  DIFFERENTIAL  RAPID URINE  DRUG SCREEN, HOSP PERFORMED  URINALYSIS, ROUTINE W REFLEX MICROSCOPIC  I-STAT CHEM 8, ED    EKG  EKG Interpretation  Date/Time:  Sunday February 01 2017 13:04:17 EST Ventricular Rate:  77 PR Interval:    QRS Duration: 107 QT Interval:  417 QTC Calculation: 472 R Axis:   32 Text Interpretation:  Sinus rhythm LVH with secondary repolarization abnormality Anterior ST elevation, probably due to LVH since last tracing no significant change Confirmed by Olaf Mesa  MD, Travaughn Vue ([id number]) on 02/01/2017 1:13:04 PM       Radiology Ct Head Wo Contrast  Result Date: 02/01/2017 CLINICAL DATA:  Pt reports slurred speech since Friday morning and difficulty speaking. ( x 3 days) Pt having weakness in left leg and right leg. Hx of TIA,HTN,DM, stroke, right total hip/bbj EXAM: CT HEAD WITHOUT CONTRAST TECHNIQUE: Contiguous axial images were obtained from the base of the skull through the vertex without intravenous contrast. COMPARISON:  04/23/2013 FINDINGS: Brain: There is focal low-attenuation within the posterior limb of the left internal capsule consistent with subacute infarct. There is no associated hemorrhage. There are mild periventricular white matter changes consistent with small vessel disease. Remote lacunar infarct of the right corona radiata/lentiform nucleus, stable in appearance. Vascular: No hyperdense vessel or unexpected calcification. Skull: Normal. Negative for fracture or focal lesion. Sinuses/Orbits: No acute finding. Other: None IMPRESSION: 1. Probable subacute infarct of the posterior horn of the left internal capsule not associated with hemorrhage. 2. Periventricular white matter changes. 3. Remote lacunar infarct of the right corona radiata/lentiform nucleus. The salient findings were discussed with Garnet Overfield on 02/01/2017 at 1:58 pm. Electronically Signed   By: Nolon Nations M.D.   On: 02/01/2017 13:58    Procedures Procedures (including critical care time)  Medications Ordered in  ED Medications - No data to display   Initial Impression / Assessment and Plan / ED Course  I have reviewed the triage vital signs and the nursing notes.  Pertinent labs & imaging results that were available during my care of the patient were reviewed by me and considered in my medical decision making (see chart for details).   He presentation  is consistent with an acute stroke. I suspect this is ischemic but it could be hemorrhagic with some evolution of his symptoms. We'll obtain a stat CT scan, labs, EKG, the patient will obviously need to be admitted to the hospital.  CT scan read as a subacute stroke in the left internal capsule, discussed with radiologist, also discussed with internal medicine physician who will admit to the hospital. Blood pressure slightly improved, will allow permissive hypertension at this time.  Vitals:   02/01/17 1300 02/01/17 1302 02/01/17 1306  BP:  (!) 150/117   Pulse:  80   Resp:  20   Temp:  98 F (36.7 C)   TempSrc:  Oral   SpO2:   97%  Weight: 265 lb (120.2 kg)    Height: 6\' 6"  (1.981 m)       Final Clinical Impressions(s) / ED Diagnoses   Final diagnoses:  Acute ischemic stroke (HCC)  Hypertension, unspecified type    New Prescriptions New Prescriptions   No medications on file     Noemi Chapel, MD 02/01/17 1421

## 2017-02-01 NOTE — ED Notes (Signed)
hospitalist at bedside

## 2017-02-01 NOTE — ED Notes (Signed)
Pt with stroke like sx since Friday am with more trouble speaking on Sat am. Now with R facial droop,. Slurred speech with R leg weakness

## 2017-02-01 NOTE — H&P (Signed)
TRH H&P   Patient Demographics:    Raymond Barrera, is a 56 y.o. male  MRN: [id number]   DOB - 23-May-1961  Admit Date - 02/01/2017  Outpatient Primary MD for the patient is Maricela Curet, MD  Referring MD/NP/PA: Dr Sabra Heck  Patient coming from: Home  Chief Complaint  Patient presents with  . Stroke Symptoms      HPI:    Raymond Barrera  is a 56 y.o. 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 when he woke up was started to notice right-sided weakness which prompted her to bring him to ED, she reports patient has been compliant with his medication, occluding blood pressure, statins, and he did take full dose aspirin this a.m., in ED workup significant for subacute CVA with no evidence of hemorrhage, I was called to admit,    Review of systems:    In addition to the HPI above,  No Fever-chills, No Headache, No changes with Vision or hearing, Problems swallowing food and liquids No Chest pain, Cough or Shortness of Breath, No Abdominal pain, No Nausea or Vommitting, Bowel movements are regular, No Blood in stool or Urine, No dysuria, No new skin rashes or bruises, No new joints pains-aches,  And planes of Dysarthria, dysphagia, and right-sided weakness No recent weight gain or loss, No polyuria, polydypsia or polyphagia, No significant Mental Stressors.  A full 10 point Review of Systems was done, except as stated above, all other Review of  Systems were negative.   With Past History of the following :    Past Medical History:  Diagnosis Date  . Anxiety    takes Valium daily as needed  . Arthritis   . Back pain    hx of buldging disc  . Diabetes mellitus without complication ([place])    takes Metformin daily  . Headache    migraines  . High cholesterol    takes Zocor daily  . Hypertension    takes Benazepril and HCTZ  daily  . Joint pain   . Joint swelling   . Memory impairment    occassional - from stroke  . Myocardial infarction 1987  . Pneumonia    hx of-80's  . PONV (postoperative nausea and vomiting)    took a while for him to wake up after previous anesthesia  . Shortness of breath dyspnea    with exertion  . Slurred speech   . Stroke ([place]) 08/2013  . TIA (transient ischemic attack) 2014   x 7   .  Urinary frequency       Past Surgical History:  Procedure Laterality Date  . ANKLE SURGERY  2008   left ankle-otif-[facility]  . BACK SURGERY    . JOINT REPLACEMENT     both hips replaced   . LUMBAR LAMINECTOMY/DECOMPRESSION MICRODISCECTOMY Right 10/17/2014   Procedure: LUMBAR LAMINECTOMY/DECOMPRESSION MICRODISCECTOMY 2 LEVELS;  Surgeon: [name], MD;  Location: [facility];  Service: Neurosurgery;  Laterality: Right;  Right L45 L5S1 laminectomy and foraminotomy  . MASS EXCISION  09/13/2012   Procedure: EXCISION MASS;  Surgeon: Jamesetta So, MD;  Location: [facility];  Service: General;  Laterality: N/A;  . TONSILLECTOMY    . TOTAL HIP ARTHROPLASTY Right 03/20/2015  . TOTAL HIP ARTHROPLASTY Right 03/20/2015   Procedure: TOTAL HIP ARTHROPLASTY ANTERIOR APPROACH;  Surgeon: Renette Butters, MD;  Location: [facility];  Service: Orthopedics;  Laterality: Right;  . TOTAL HIP ARTHROPLASTY Left 01/08/2016   Procedure: TOTAL HIP ARTHROPLASTY ANTERIOR APPROACH;  Surgeon: Renette Butters, MD;  Location: [facility];  Service: Orthopedics;  Laterality: Left;      Social History:     Social History  Substance Use  Topics  . Smoking status: Current Every Day Smoker    Packs/day: 0.50    Years: 47.00    Types: Cigarettes  . Smokeless tobacco: Never Used  . Alcohol use No     Comment: quit 2012     Lives - At home  Mobility - dependent     Family History :    History reviewed. No pertinent family history.    Home Medications:   Prior to Admission medications   Medication Sig Start Date End Date Taking? Authorizing Provider  amLODipine (NORVASC) 10 MG tablet Take 10 mg by mouth daily.    Historical Provider, MD  aspirin 325 MG tablet Take 1 tablet (325 mg total) by mouth daily. 01/08/16   Brittney Claiborne Billings, PA-C  benazepril (LOTENSIN) 20 MG tablet Take 20 mg by mouth daily.    Historical Provider, MD  FOLIC ACID PO Take 1 tablet by mouth daily.    Historical Provider, MD  IRON PO Take 1 tablet by mouth daily.    Historical Provider, MD  labetalol (NORMODYNE) 200 MG tablet Take 200 mg by mouth 2 (two) times daily.    Historical Provider, MD  metFORMIN (GLUCOPHAGE) 500 MG tablet Take 500 mg by mouth 2 (two) times daily with a meal.    Historical Provider, MD  Multiple Vitamin (MULTIVITAMIN WITH MINERALS) TABS tablet Take 1 tablet by mouth daily.    Historical Provider, MD  naproxen sodium (ANAPROX) 220 MG tablet Take 440 mg by mouth 2 (two) times daily with a meal.    Historical Provider, MD  Omega-3 Fatty Acids (FISH OIL PO) Take 1 capsule by mouth daily.    Historical Provider, MD  oxyCODONE (ROXICODONE) 15 MG immediate release tablet Take 15 mg by mouth every 6 (six) hours as needed for pain.    Historical Provider, MD  simvastatin (ZOCOR) 40 MG tablet Take 40 mg by mouth daily.    Historical Provider, MD  VITAMIN E PO Take 1 tablet by mouth daily.    Historical Provider, MD     Allergies:     Allergies  Allergen Reactions  . Poison Ivy Extract [Poison Ivy Extract] Itching and Rash     Physical Exam:   Vitals  Blood pressure (!) 170/125, pulse 77, temperature 98 F (36.7 C), resp.  rate 18, height 6\' 6"  (  1.981 m), weight 120.2 kg (265 lb), SpO2 100 %.   1. General Well-developed  male lying in bed in NAD,    2. Normal affect and insight, Not Suicidal or Homicidal, Awake Alert, Oriented X 3.  3. Patient with right facial droop, 4 out of 5 right upper extremity weakness, 4 out of 5 right lower extremity weakness, poor finger-to-nose test on the right side, positive right pronator drift.  4. Ears and Eyes appear Normal, Conjunctivae clear, PERRLA. Moist Oral Mucosa.  5. Supple Neck, No JVD, No cervical lymphadenopathy appriciated, No Carotid Bruits.  6. Symmetrical Chest wall movement, Good air movement bilaterally, CTAB.  7. RRR, No Gallops, Rubs or Murmurs, No Parasternal Heave.  8. Positive Bowel Sounds, Abdomen Soft, No tenderness, No organomegaly appriciated,No rebound -guarding or rigidity.  9.  No Cyanosis, Normal Skin Turgor, No Skin Rash or Bruise.  10. Good muscle tone,  joints appear normal , no effusions, Reduced range of motion and left lower extremity secondary to previous hip surgery.  11. No Palpable Lymph Nodes in Neck or Axillae    Data Review:    CBC  Recent Labs Lab 02/01/17 1300  WBC 8.9  HGB 14.6  HCT 43.3  PLT 222  MCV 87.5  MCH 29.5  MCHC 33.7  RDW 14.5  LYMPHSABS 1.8  MONOABS 0.5  EOSABS 0.1  BASOSABS 0.0   ------------------------------------------------------------------------------------------------------------------  Chemistries   Recent Labs Lab 02/01/17 1300  NA 141  K 3.7  CL 106  CO2 26  GLUCOSE 212*  BUN 11  CREATININE 1.14  CALCIUM 9.7  AST 22  ALT 21  ALKPHOS 83  BILITOT 0.8   ------------------------------------------------------------------------------------------------------------------ estimated creatinine clearance is 104.1 mL/min (by C-G formula based on SCr of 1.14  mg/dL). ------------------------------------------------------------------------------------------------------------------ No results for input(s): TSH, T4TOTAL, T3FREE, THYROIDAB in the last 72 hours.  Invalid input(s): FREET3  Coagulation profile  Recent Labs Lab 02/01/17 1300  INR 0.99   ------------------------------------------------------------------------------------------------------------------- No results for input(s): DDIMER in the last 72 hours. -------------------------------------------------------------------------------------------------------------------  Cardiac Enzymes No results for input(s): CKMB, TROPONINI, MYOGLOBIN in the last 168 hours.  Invalid input(s): CK ------------------------------------------------------------------------------------------------------------------ No results found for: BNP   ---------------------------------------------------------------------------------------------------------------  Urinalysis    Component Value Date/Time   COLORURINE YELLOW 11/05/2016 1330   APPEARANCEUR CLEAR 11/05/2016 1330   LABSPEC 1.017 11/05/2016 1330   PHURINE 5.0 11/05/2016 1330   GLUCOSEU NEGATIVE 11/05/2016 1330   HGBUR NEGATIVE 11/05/2016 1330   BILIRUBINUR NEGATIVE 11/05/2016 1330   KETONESUR NEGATIVE 11/05/2016 1330   PROTEINUR 100 (A) 11/05/2016 1330   UROBILINOGEN 1.0 03/08/2015 1011   NITRITE NEGATIVE 11/05/2016 1330   LEUKOCYTESUR NEGATIVE 11/05/2016 1330    ----------------------------------------------------------------------------------------------------------------   Imaging Results:    Ct Head Wo Contrast  Result Date: 02/01/2017 CLINICAL DATA:  Pt reports slurred speech since Friday morning and difficulty speaking. ( x 3 days) Pt having weakness in left leg and right leg. Hx of TIA,HTN,DM, stroke, right total hip/bbj EXAM: CT HEAD WITHOUT CONTRAST TECHNIQUE: Contiguous axial images were obtained from the base of the skull  through the vertex without intravenous contrast. COMPARISON:  04/23/2013 FINDINGS: Brain: There is focal low-attenuation within the posterior limb of the left internal capsule consistent with subacute infarct. There is no associated hemorrhage. There are mild periventricular white matter changes consistent with small vessel disease. Remote lacunar infarct of the right corona radiata/lentiform nucleus, stable in appearance. Vascular: No hyperdense vessel or unexpected calcification. Skull: Normal. Negative for fracture or focal lesion. Sinuses/Orbits: No  acute finding. Other: None IMPRESSION: 1. Probable subacute infarct of the posterior horn of the left internal capsule not associated with hemorrhage. 2. Periventricular white matter changes. 3. Remote lacunar infarct of the right corona radiata/lentiform nucleus. The salient findings were discussed with BRIAN MILLER on 02/01/2017 at 1:58 pm. Electronically Signed   By: Nolon Nations M.D.   On: 02/01/2017 13:58    My personal review of EKG: Rhythm NSR, Rate  77 /min, QTc 472 , no Acute ST changes   Assessment & Plan:    Active Problems:   Current smoker   Acute CVA (cerebrovascular accident) ([place])   Diabetes mellitus ([place])   HTN (hypertension)   Acute CVA - patient presents with Sudden weakness, facial droop and dysarthria symptoms developed over last 36 hours, CT head with evidence of subacute infarct of the posterior horn of the left internal capsule, no hemorrhage, admitted under ischemic stroke pathway, will obtain MRI brain, MRA head, carotid Doppler, 2-D echo, continue to monitor on telemetry, lipid panel, hemoglobin A1c, patient already received full dose aspirin this a.m. by his wife, will allow for permissive hypertension, and neuro consult placed in the system. - PT/OT/SLP consulted  Hypertension - Blood pressure is elevated, will hold antihypertensive medication to allow for permissive hypertension  Hyperlipidemia - Check lipid  panel, meanwhile Continue with home dose statin and fish oil  Current smoker - Patient reports his down to 2 cigarettes per day, counseled about smoking and the instruments.  Diabetes mellitus - Hold metformin, check hemoglobin A1c, will start an insulin sliding scale every 4 hours until she passes a swallow evaluation  History of cocaine abuse - Patient reports he stopped substance abuse , UDS pending   DVT Prophylaxis Heparin - SCDs  AM Labs Ordered, also please review Full Orders  Family Communication: Admission, patients condition and plan of care including tests being ordered have been discussed with the patient and wife who indicate understanding and agree with the plan and Code Status.  Code Status Full  Likely DC to  Pending PT  Condition GUARDED    Consults called: neuro consult placed on EPIC.  Admission status: inpatient  Time spent in minutes : 65 minutes   Alton Bouknight M.D on 02/01/2017 at 2:37 PM  Between 7am to 7pm - Pager - [phone]. After 7pm go to [email or web address] - password [facility]  [facility] - Office  [phone]

## 2017-02-01 NOTE — ED Notes (Signed)
To CT

## 2017-02-01 NOTE — ED Notes (Signed)
Mother to room while pt finishing swallow screen- pt became very emotional and upset hob elevated, pt reassured and swallow screen finished

## 2017-02-01 NOTE — ED Notes (Signed)
Pt c/o feeling SOB.  Pt no visible distress.   sats 98%

## 2017-02-01 NOTE — ED Triage Notes (Addendum)
Pt reports slurred speech since Friday morning and difficulty speaking.  Pt having weakness in left leg and right leg. Alert and oriented at this time.  Dr.Miller at bedside.

## 2017-02-01 NOTE — ED Notes (Signed)
Call to floor for report, unable to give at this time

## 2017-02-02 ENCOUNTER — Inpatient Hospital Stay (HOSPITAL_COMMUNITY): Payer: Medicaid Other

## 2017-02-02 DIAGNOSIS — I6789 Other cerebrovascular disease: Secondary | ICD-10-CM

## 2017-02-02 LAB — LIPID PANEL
Cholesterol: 180 mg/dL (ref 0–200)
HDL: 31 mg/dL — ABNORMAL LOW
LDL Cholesterol: 115 mg/dL — ABNORMAL HIGH (ref 0–99)
Total CHOL/HDL Ratio: 5.8 ratio
Triglycerides: 169 mg/dL — ABNORMAL HIGH
VLDL: 34 mg/dL (ref 0–40)

## 2017-02-02 LAB — GLUCOSE, CAPILLARY
GLUCOSE-CAPILLARY: 121 mg/dL — AB (ref 65–99)
GLUCOSE-CAPILLARY: 96 mg/dL (ref 65–99)
Glucose-Capillary: 101 mg/dL — ABNORMAL HIGH (ref 65–99)
Glucose-Capillary: 138 mg/dL — ABNORMAL HIGH (ref 65–99)
Glucose-Capillary: 139 mg/dL — ABNORMAL HIGH (ref 65–99)
Glucose-Capillary: 133 mg/dL — ABNORMAL HIGH (ref 65–99)

## 2017-02-02 LAB — ECHOCARDIOGRAM COMPLETE
Height: 78 in
Weight: 4240 oz

## 2017-02-02 MED ORDER — LORAZEPAM 2 MG/ML IJ SOLN
1.0000 mg | Freq: Once | INTRAMUSCULAR | Status: AC
  Administered 2017-02-02: 1 mg via INTRAVENOUS
  Filled 2017-02-02: qty 1

## 2017-02-02 MED ORDER — ENOXAPARIN SODIUM 60 MG/0.6ML ~~LOC~~ SOLN
60.0000 mg | SUBCUTANEOUS | Status: DC
  Administered 2017-02-02 – 2017-02-04 (×3): 60 mg via SUBCUTANEOUS
  Filled 2017-02-02 (×3): qty 0.6

## 2017-02-02 MED ORDER — MORPHINE SULFATE (PF) 2 MG/ML IV SOLN
2.0000 mg | INTRAVENOUS | Status: DC | PRN
  Administered 2017-02-03 – 2017-02-05 (×4): 2 mg via INTRAVENOUS
  Filled 2017-02-02 (×4): qty 1

## 2017-02-02 NOTE — Progress Notes (Signed)
Subacute CVA nonhemorrhagic of left internal capsule manifesting with right upper extremity weakness and right facial droop he should alert and oriented swallowing evaluation cleared for eating undergoing physical therapy currently on full dose aspirin as a history of hypertension diabetes and hyperlipidemia which is treated Raymond Barrera [id number] DOB: Jun 11, 1961 DOA: 02/01/2017 PCP: Maricela Curet, MD   Physical Exam: Blood pressure (!) 159/98, pulse 73, temperature 97.9 F (36.6 C), temperature source Oral, resp. rate 16, height 6\' 6"  (1.981 m), weight 120.2 kg (265 lb), SpO2 (!) 9 %. Right facial droop noted right upper extremity 2 out of 5 motor strength otherwise normal neurologic gross exam lungs diminished breath sounds in bases no rales wheeze rhonchi heart regular rhythm no S3-S4 no heaves thrills or rubs extremities no clubbing cyanosis or edema   Investigations:  No results found for this or any previous visit (from the past 240 hour(s)).   Basic Metabolic Panel:  Recent Labs  02/01/17 1300  NA 141  K 3.7  CL 106  CO2 26  GLUCOSE 212*  BUN 11  CREATININE 1.14  CALCIUM 9.7   Liver Function Tests:  Recent Labs  02/01/17 1300  AST 22  ALT 21  ALKPHOS 83  BILITOT 0.8  PROT 7.4  ALBUMIN 3.7     CBC:  Recent Labs  02/01/17 1300  WBC 8.9  NEUTROABS 6.5  HGB 14.6  HCT 43.3  MCV 87.5  PLT 222    Ct Head Wo Contrast  Result Date: 02/01/2017 CLINICAL DATA:  Pt reports slurred speech since Friday morning and difficulty speaking. ( x 3 days) Pt having weakness in left leg and right leg. Hx of TIA,HTN,DM, stroke, right total hip/bbj EXAM: CT HEAD WITHOUT CONTRAST TECHNIQUE: Contiguous axial images were obtained from the base of the skull through the vertex without intravenous contrast. COMPARISON:  04/23/2013 FINDINGS: Brain: There is focal low-attenuation within the posterior limb of the left internal capsule consistent with subacute infarct. There is no  associated hemorrhage. There are mild periventricular white matter changes consistent with small vessel disease. Remote lacunar infarct of the right corona radiata/lentiform nucleus, stable in appearance. Vascular: No hyperdense vessel or unexpected calcification. Skull: Normal. Negative for fracture or focal lesion. Sinuses/Orbits: No acute finding. Other: None IMPRESSION: 1. Probable subacute infarct of the posterior horn of the left internal capsule not associated with hemorrhage. 2. Periventricular white matter changes. 3. Remote lacunar infarct of the right corona radiata/lentiform nucleus. The salient findings were discussed with BRIAN MILLER on 02/01/2017 at 1:58 pm. Electronically Signed   By: Nolon Nations M.D.   On: 02/01/2017 13:58   Mr Brain Wo Contrast  Result Date: 02/02/2017 CLINICAL DATA:  Stroke.  Slurred speech. EXAM: MRI HEAD WITHOUT CONTRAST TECHNIQUE: Multiplanar, multiecho pulse sequences of the brain and surrounding structures were obtained without intravenous contrast. COMPARISON:  CT head 02/01/2017 FINDINGS: Brain: Acute infarct in the left basal ganglia involving putamen and posterior internal capsule fibers. This measures approximately 10 x 15 mm. No other acute infarct identified. Chronic microvascular ischemic changes in the cerebral white matter. Ventricle size and cerebral volume normal. Negative for hemorrhage or mass. Vascular: Normal arterial flow voids. Skull and upper cervical spine: Negative Sinuses/Orbits: Negative Other: None IMPRESSION: Acute infarct in the left putamen and internal capsule posteriorly. Mild to moderate chronic microvascular ischemic changes in the white matter. Electronically Signed   By: Franchot Gallo M.D.   On: 02/02/2017 10:23   US Carotid Bilateral (at [facility] And [facility] Only)  Result Date: 02/02/2017 CLINICAL DATA:  Slurred speech. Right-sided weakness. Right-sided facial droop. History of CAD (post myocardial infarction, hyperlipidemia, diabetes and  smoking. EXAM: BILATERAL CAROTID DUPLEX ULTRASOUND TECHNIQUE: Pearline Cables scale imaging, color Doppler and duplex ultrasound were performed of bilateral carotid and vertebral arteries in the neck. COMPARISON:  None. FINDINGS: Criteria: Quantification of carotid stenosis is based on velocity parameters that correlate the residual internal carotid diameter with NASCET-based stenosis levels, using the diameter of the distal internal carotid lumen as the denominator for stenosis measurement. The following velocity measurements were obtained: RIGHT ICA:  42/16 cm/sec CCA:  [id number] cm/sec SYSTOLIC ICA/CCA RATIO:  0.3 DIASTOLIC ICA/CCA RATIO:  0.6 ECA:  74 cm/sec LEFT ICA:  44/17 cm/sec CCA:  [id number] cm/sec SYSTOLIC ICA/CCA RATIO:  1.0 DIASTOLIC ICA/CCA RATIO:  1.1 ECA:  65 cm/sec RIGHT CAROTID ARTERY: There is a minimal amount intimal thickening within the right carotid bulb (images 14 and 16). There is a minimal amount of slightly irregular eccentric plaque involving the origin and proximal aspect the right internal carotid artery (is 24), not resulting in elevated peak systolic velocities within the interrogated course the right internal carotid artery to suggest a hemodynamically significant stenosis. RIGHT VERTEBRAL ARTERY:  Antegrade flow LEFT CAROTID ARTERY: There is no grayscale evidence of significant intimal thickening or atherosclerotic plaque affecting interrogated portions of the left carotid system. There are no elevated peak systolic velocity within pterygoid a course of the left internal carotid artery to suggest a hemodynamically significant stenosis. LEFT VERTEBRAL ARTERY:  Antegrade Flow IMPRESSION: 1. Minimal amount of right-sided atherosclerotic plaque, not resulting in hemodynamically significant stenosis. 2. Unremarkable sonographic evaluation of the left carotid system. Electronically Signed   By: Sandi Mariscal M.D.   On: 02/02/2017 11:19   Mr Jodene Nam Head/brain [id number] Cm  Result Date: 02/02/2017 CLINICAL DATA:   Stroke. EXAM: MRA HEAD WITHOUT CONTRAST TECHNIQUE: Angiographic images of the Circle of Willis were obtained using MRA technique without intravenous contrast. COMPARISON:  MRI head 02/02/2017 FINDINGS: Left vertebral dominant. Decreased signal in the vertebral arteries bilaterally appear to be artifactual. PICA patent bilaterally. Basilar widely patent. Decreased signal in the distal posterior cerebral artery bilaterally appears be due to artifact and tortuosity. Moderate stenosis in the cavernous carotid on the right due to atherosclerotic disease. Left cavernous carotid appears widely patent. Anterior and middle cerebral arteries patent bilaterally. Atherosclerotic irregularity in the middle cerebral artery branches bilaterally without large vessel occlusion. IMPRESSION: Image quality degraded by artifact as above No large vessel occlusion. Moderate atherosclerotic disease in the middle cerebral artery branches bilaterally. Mild stenosis of the right cavernous carotid. Electronically Signed   By: Franchot Gallo M.D.   On: 02/02/2017 10:27      Medication  Impression:  Active Problems:   Current smoker   Acute CVA (cerebrovascular accident) ([place])   Diabetes mellitus ([place])   HTN (hypertension)     Plan: No neurology here for the entire week. The good news is his son nonhemorrhagic stroke will be treated with full dose aspirin will discuss merits of Plavix added to this regimen will monitor hemodynamics as well as hyperglycemia and proceed with speech and physical therapy  Consultants: Physical therapy speech therapy   Procedures speech evaluation   Antibiotics:         Time spent: 45 minutes   LOS: 1 day   Tyrian Peart M   02/02/2017, 12:50 PM

## 2017-02-02 NOTE — Evaluation (Signed)
Physical Therapy Evaluation Patient Details Name: Raymond Barrera MRN: [id number] DOB: 03/30/1961 Today's Date: 02/02/2017   History of Present Illness  Raymond Barrera  is a 56 y.o. 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 when he woke up was started to notice right-sided weakness which prompted her to bring him to ED, she reports patient has been compliant with his medication, occluding blood pressure, statins, and he did take full dose aspirin this a.m., in ED workup significant for subacute CVA with no evidence of hemorrhage,  Clinical Impression  Pt received in bed, and was agreeable to PT evaluation.  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  During PT evaluation, he required Min A for transfer bed<>w/c due to unsteadiness and some dis coordination with R LE.  He also required cues for safety awareness when going to turn and sit down.  Gait not tested due to pt getting ready to go down for MRI and [place].  Recommend OPPT upon d/c, and use of his RW for ambulation.      Follow Up Recommendations Outpatient PT    Equipment Recommendations  None recommended by PT    Recommendations for Other Services       Precautions / Restrictions Precautions Precautions: Fall Precaution Comments: No falls, but 2 near-misses - 1) in the bathroom and lost balance - wife caught him.  2) after getting OOB - caught himself on the wall.   Restrictions Weight Bearing Restrictions: No      Mobility  Bed  Mobility Overal bed mobility: Modified Independent                Transfers Overall transfer level: Needs assistance Equipment used: None Transfers: Sit to/from [employer] Transfers Sit to Stand: Min guard Stand pivot transfers: Min assist       General transfer comment: Pt demonstrates some unsteadiness during transfer, but easily follows commands to follow safety - reaching back for arm rests.   Ambulation/Gait Ambulation/Gait assistance:  (NA - pt being transferred to MRI and [place].  )              Stairs            Wheelchair Mobility    Modified Rankin (Stroke Patients Only)       Balance Overall balance assessment: Needs assistance Sitting-balance support: Bilateral upper extremity supported;Feet supported Sitting balance-Leahy Scale: Good     Standing balance support: No upper extremity supported Standing balance-Leahy Scale: Fair                               Pertinent Vitals/Pain Pain Assessment: No/denies pain    Home Living Family/patient expects to be discharged to:: Private residence Living Arrangements: Spouse/significant other Available Help at Discharge: Available 24 hours/day Type of Home: Apartment Home Access: Level entry     Home Layout: One level Home Equipment: Walker - 2 wheels;Cane - single point;Crutches      Prior Function  Gait / Transfers Assistance Needed: Pt ambulates with cane all the time.  community ambulator, still driving.   ADL's / Homemaking Assistance Needed: independent         Hand Dominance   Dominant Hand: Right    Extremity/Trunk Assessment   Upper Extremity Assessment Upper Extremity Assessment: Defer to OT evaluation RUE Deficits / Details: Assessed during functional task RUE Coordination: decreased fine motor;decreased gross motor    Lower Extremity Assessment Lower Extremity Assessment: Overall WFL for tasks assessed;RLE deficits/detail RLE Deficits / Details:  assessed during functional mobility.  RLE Coordination: decreased gross motor       Communication   Communication: Other (comment) (slurred speech)  Cognition Arousal/Alertness: Awake/alert Behavior During Therapy: WFL for tasks assessed/performed Overall Cognitive Status: Within Functional Limits for tasks assessed                      General Comments      Exercises     Assessment/Plan    PT Assessment Patient needs continued PT services  PT Problem List Decreased strength;Decreased activity tolerance;Decreased balance;Decreased mobility;Decreased coordination;Decreased safety awareness;Decreased knowledge of precautions       PT Treatment Interventions DME instruction;Gait training;Functional mobility training;Therapeutic activities;Therapeutic exercise;Balance training;Neuromuscular re-education;Patient/family education    PT Goals (Current goals can be found in the Care Plan section)  Acute Rehab PT Goals Patient Stated Goal: to go home PT Goal Formulation: With patient/family Time For Goal Achievement: 02/09/17 Potential to Achieve Goals: Good    Frequency 7X/week   Barriers to discharge        Co-evaluation PT/OT/SLP Co-Evaluation/Treatment: Yes Reason for Co-Treatment: To address functional/ADL transfers PT goals addressed during session: Mobility/safety with mobility;Balance OT goals addressed during session: Strengthening/ROM;ADL's and self-care       End of Session Equipment Utilized During Treatment: Gait belt Activity Tolerance: Patient tolerated treatment well Patient left:  (in w/c with transport. ) Nurse Communication: Mobility status Olean Ree, RN present in the room during mobiltiy assessment.  Mobility sheet left in the room. ) PT Visit Diagnosis: Other abnormalities of gait and mobility (R26.89);Muscle weakness (generalized) (M62.81);Ataxic gait (R26.0)    Functional Assessment Tool Used: AM-PAC 6 Clicks Basic Mobility;Clinical  judgement Functional Limitation: Mobility: Walking and moving around Mobility: Walking and Moving Around Current Status [id number]): At least 20 percent but less than 40 percent impaired, limited or restricted Mobility: Walking and Moving Around Goal Status [phone]): At least 1 percent but less than 20 percent impaired, limited or restricted    Time: 0854-0910 PT Time Calculation (min) (ACUTE ONLY): 16 min   Charges:   PT Evaluation $PT Eval Low Complexity: 1 Procedure     PT G Codes:   PT G-Codes **NOT FOR INPATIENT CLASS** Functional Assessment Tool Used: AM-PAC 6 Clicks Basic Mobility;Clinical judgement Functional Limitation: Mobility: Walking and moving around Mobility: Walking and Moving Around Current Status [id number]): At least 20 percent but less than 40 percent impaired, limited or restricted Mobility: Walking and Moving Around Goal Status [phone]): At least 1 percent but less than 20 percent impaired, limited or restricted    Beth Marolyn Urschel, PT, DPT X: [phone]

## 2017-02-02 NOTE — Progress Notes (Signed)
Late entry:  Dr. Cindie Laroche saw patient today.  Gave order to discontinue neurology consult in-patient and stated tele-neurology consult not needed.  Dr. Cindie Laroche stated he will arrange out-patient neurology follow-up.  Gave order to decrease fluids to 10 ml/hr.

## 2017-02-02 NOTE — Progress Notes (Signed)
*  PRELIMINARY RESULTS* Echocardiogram 2D Echocardiogram has been performed.  Leavy Cella 02/02/2017, 4:15 PM

## 2017-02-02 NOTE — Evaluation (Signed)
Occupational Therapy Evaluation Patient Details Name: Raymond Barrera MRN: [id number] DOB: [birth year] Today's Date: 02/02/2017    History of Present Illness Raymond Barrera  is a 56 y.o. 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 when he woke up was started to notice right-sided weakness which prompted her to bring him to ED, she reports patient has been compliant with his medication, occluding blood pressure, statins, and he did take full dose aspirin this a.m., in ED workup significant for subacute CVA with no evidence of hemorrhage,   Clinical Impression   Patient and wife agreeable to participate in OT and PT evaluation. Evaluation was shortened due to transporter arriving for patient. Briefly was able to assess patient's functional performance prior to leaving for CT and [place]. Patient presents with decrease RUE strength, coordination, and grip and pinch strength causing increased difficulty completing daily tasks. Acute OT will work with patient during hospital stay to focus on these deficits. Recommend outpatient OT at discharge.     Follow Up Recommendations  Outpatient OT    Equipment Recommendations  None recommended by OT    Recommendations for Other Services       Precautions / Restrictions Precautions Precautions: Fall Precaution Comments: No falls, but 2 near-misses - 1) in the bathroom and lost balance - wife caught him.  2) after getting OOB - caught himself on the wall.   Restrictions Weight Bearing Restrictions: No              ADL Overall  ADL's : Needs assistance/impaired                     Lower Body Dressing: Min guard;Sit to/from stand Lower Body Dressing Details (indicate cue type and reason): Donning hospital socks. Decreased coordination and increased time. Min guard for safety while seated on EOB.       Toileting - Clothing Manipulation Details (indicate cue type and reason): patient used urinal while seated on EOB with therapist retrieving urinal and emptying for patient.       General ADL Comments: PT completed transfer from bed to wheelchair. See PT evaluation     Vision Baseline Vision/History:  (Unknown) Additional Comments: Vision not assessed at time of evaluation.            Pertinent Vitals/Pain Pain Assessment: No/denies pain     Hand Dominance Right   Extremity/Trunk Assessment Upper Extremity Assessment Upper Extremity Assessment: Generalized weakness;RUE deficits/detail RUE Deficits / Details: Assessed during functional task RUE Coordination: decreased gross motor;decreased fine motor   Lower Extremity Assessment Lower Extremity Assessment: Defer to PT evaluation       Communication Communication Communication: Other (comment) (slurred speech)   Cognition Arousal/Alertness: Awake/alert Behavior During Therapy: WFL for tasks assessed/performed Overall Cognitive Status: Within Functional Limits for tasks assessed                                Home Living Family/patient expects to be discharged to:: Private residence Living Arrangements: Spouse/significant other Available Help at Discharge: Available 24  hours/day Type of Home: Apartment Home Access: Level entry     Home Layout: One level     Bathroom Shower/Tub: Teacher, early years/pre: Standard     Home Equipment: Environmental consultant - 2 wheels;Cane - single point;Crutches          Prior Functioning/Environment    Gait / Transfers Assistance Needed: Pt ambulates with cane all the time.  community  ambulator, still driving.  ADL's / Homemaking Assistance Needed: independent             OT Problem List: Decreased range of motion;Impaired UE functional use;Decreased coordination;Decreased strength      OT Treatment/Interventions: Self-care/ADL training;Therapeutic exercise;Therapeutic activities;Neuromuscular education;DME and/or AE instruction;Patient/family education    OT Goals(Current goals can be found in the care plan section) Acute Rehab OT Goals Patient Stated Goal: to go home OT Goal Formulation: With patient/family Time For Goal Achievement: 02/16/17 Potential to Achieve Goals: Good  OT Frequency: Min 2X/week           Co-evaluation PT/OT/SLP Co-Evaluation/Treatment: Yes Reason for Co-Treatment: To address functional/ADL transfers   OT goals addressed during session: Strengthening/ROM;ADL's and self-care      End of Session Equipment Utilized During Treatment: Gait belt  Activity Tolerance: Patient tolerated treatment well Patient left: Other (comment) (with transporter in wheelchair headed to MRI and [place])  OT Visit Diagnosis: Muscle weakness (generalized) (M62.81)                ADL either performed or assessed with clinical judgement  Time: [id number] OT Time Calculation (min): 16 min Charges:  OT General Charges $OT Visit: 1 Procedure OT Evaluation $OT Eval Low Complexity: 1 Procedure G-Codes: OT G-codes **NOT FOR INPATIENT CLASS** Functional Assessment Tool Used: AM-PAC 6 Clicks Daily Activity Functional Limitation: Self care Self Care Current Status [id number]): At least 40 percent but less than 60 percent impaired, limited or restricted Self Care Goal Status [id number]): At least 20 percent but less than 40 percent impaired, limited or restricted   Raymond Barrera, OTR/L,CBIS  [phone]   Raymond Barrera, Raymond Barrera 02/02/2017, 10:04 AM

## 2017-02-02 NOTE — Progress Notes (Signed)
ANTICOAGULATION CONSULT NOTE - Initial Consult  Pharmacy Consult for Lovenox (enoxaparin) Indication: VTE prophylaxis  Allergies  Allergen Reactions  . Poison Ivy Extract [Poison Ivy Extract] Itching and Rash    Patient Measurements: Height: 6\' 6"  (198.1 cm) Weight: 265 lb (120.2 kg) IBW/kg (Calculated) : 91.4  Vital Signs: Temp: 97.9 F (36.6 C) (03/05 1036) Temp Source: Oral (03/05 1036) BP: 159/98 (03/05 1036) Pulse Rate: 73 (03/05 1036)  Labs:  Recent Labs  02/01/17 1300  HGB 14.6  HCT 43.3  PLT 222  APTT 32  LABPROT 13.1  INR 0.99  CREATININE 1.14    Estimated Creatinine Clearance: 104.1 mL/min (by C-G formula based on SCr of 1.14 mg/dL).   Medical History: Past Medical History:  Diagnosis Date  . Anxiety    takes Valium daily as needed  . Arthritis   . Back pain    hx of buldging disc  . Diabetes mellitus without complication ([place])    takes Metformin daily  . Headache    migraines  . High cholesterol    takes Zocor daily  . Hypertension    takes Benazepril and HCTZ  daily  . Joint pain   . Joint swelling   . Memory impairment    occassional - from stroke  . Myocardial infarction 1987  . Pneumonia    hx of-80's  . PONV (postoperative nausea and vomiting)    took a while for him to wake up after previous anesthesia  . Shortness of breath dyspnea    with exertion  . Slurred speech   . Stroke ([place]) 08/2013  . TIA (transient ischemic attack) 2014   x 7   . Urinary frequency     Medications:  Prescriptions Prior to Admission  Medication Sig Dispense Refill Last Dose  . amLODipine (NORVASC) 10 MG tablet Take 10 mg by mouth daily.   02/01/2017 at Unknown time  . aspirin EC 81 MG tablet Take 81 mg by mouth daily.   02/01/2017 at Unknown time  . benazepril (LOTENSIN) 20 MG tablet Take 20 mg by mouth daily.   02/01/2017 at Unknown time  . labetalol (NORMODYNE) 200 MG tablet Take 200 mg by mouth 2 (two) times daily.   02/01/2017 at Unknown time  .  LORazepam (ATIVAN) 1 MG tablet Take 1 mg by mouth every evening.   01/31/2017 at Unknown time  . metFORMIN (GLUCOPHAGE) 500 MG tablet Take 500 mg by mouth 2 (two) times daily with a meal.   02/01/2017 at Unknown time  . oxyCODONE (ROXICODONE) 15 MG immediate release tablet Take 15 mg by mouth every 6 (six) hours as needed for pain.   01/29/2017 at Unknown time  . simvastatin (ZOCOR) 40 MG tablet Take 40 mg by mouth daily.   02/01/2017 at Unknown time    Assessment: 56 yo male admitted with stroke symptoms. 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 Pharmacy consulted for dosing enoxaparin for DVT px.  Goal of Therapy:  Prevent VTE Monitor platelets by anticoagulation protocol: Yes   Plan:  Lovenox 0.5mg /kg/day (60mg ) sq q24h Monitor for s/s of bleeding  Isac Sarna, BS [place], BCPS Clinical Pharmacist Pager [phone] 02/02/2017,12:59 PM

## 2017-02-02 NOTE — Care Management Note (Signed)
Case Management Note  Patient Details  Name: Raymond Barrera MRN: [id number] Date of Birth: 07-07-1961  Subjective/Objective:                  Pt admitted with CVA. He is from home, lives with his wife and is ind with ADL's at baseline. He has RW and cane for use pta. He has PCP, transportation to appointments and no difficulty affording medications. He plans to return home at DC. Therapies have recommended OP PT/OT/SLP. Pt agreeable and wish to go to [facility].   Action/Plan: Referral placed for OP therapies. Anticipate DC home next 24-48 hrs. No further CM needs anticipated.   Expected Discharge Date:      02/03/2017            Expected Discharge Plan:  Home/Self Care  In-House Referral:  NA  Discharge planning Services  CM Consult  Post Acute Care Choice:  NA Choice offered to:  NA  Status of Service:  Completed, signed off  Sherald Barge, RN 02/02/2017, 1:04 PM

## 2017-02-02 NOTE — Evaluation (Signed)
Clinical/Bedside Swallow Evaluation Patient Details  Name: Raymond Barrera MRN: [id number] Date of Birth: [birth year]  Today's Date: 02/02/2017 Time: SLP Start Time (ACUTE ONLY): [id number] SLP Stop Time (ACUTE ONLY): 1233 SLP Time Calculation (min) (ACUTE ONLY): 29 min  Past Medical History:  Past Medical History:  Diagnosis Date  . Anxiety    takes Valium daily as needed  . Arthritis   . Back pain    hx of buldging disc  . Diabetes mellitus without complication ([place])    takes Metformin daily  . Headache    migraines  . High cholesterol    takes Zocor daily  . Hypertension    takes Benazepril and HCTZ  daily  . Joint pain   . Joint swelling   . Memory impairment    occassional - from stroke  . Myocardial infarction 1987  . Pneumonia    hx of-80's  . PONV (postoperative nausea and vomiting)    took a while for him to wake up after previous anesthesia  . Shortness of breath dyspnea    with exertion  . Slurred speech   . Stroke ([place]) 08/2013  . TIA (transient ischemic attack) 2014   x 7   . Urinary frequency    Past Surgical History:  Past Surgical History:  Procedure Laterality Date  . ANKLE SURGERY  2008   left ankle-otif-[facility]  . BACK SURGERY    . JOINT REPLACEMENT     both hips replaced   . LUMBAR LAMINECTOMY/DECOMPRESSION MICRODISCECTOMY Right 10/17/2014   Procedure: LUMBAR LAMINECTOMY/DECOMPRESSION MICRODISCECTOMY 2 LEVELS;  Surgeon: [name], MD;  Location: [facility];  Service: Neurosurgery;  Laterality: Right;  Right L45 L5S1 laminectomy and foraminotomy  . MASS EXCISION  09/13/2012   Procedure: EXCISION MASS;  Surgeon: Jamesetta So, MD;  Location: [facility];  Service: General;  Laterality: N/A;  . TONSILLECTOMY    . TOTAL HIP ARTHROPLASTY Right 03/20/2015  . TOTAL HIP ARTHROPLASTY Right 03/20/2015   Procedure: TOTAL HIP ARTHROPLASTY ANTERIOR APPROACH;  Surgeon: Renette Butters, MD;  Location: [facility];  Service: Orthopedics;  Laterality: Right;  . TOTAL  HIP ARTHROPLASTY Left 01/08/2016   Procedure: TOTAL HIP ARTHROPLASTY ANTERIOR APPROACH;  Surgeon: Renette Butters, MD;  Location: [facility];  Service: Orthopedics;  Laterality: Left;   HPI:  [name] a 56 y.o.male.Patient was brought by his wife for complaints of right-sided weakness withsymptoms starting Friday evening. Saturday late afternoon he developed some dysarthria with right facial droop. When he woke up on Sunday he started to notice right-sided weakness prompting him to come to ED. In ED workup significant for subacute CVA with no evidence of hemorrhage.   Assessment / Plan / Recommendation Clinical Impression  Pt seen at bedside for BSE seated upright in bed with wife present. Presenting with symptoms characteristic of stroke characterized by weakness of right facial muscles and rt sided facial drooping with obvious dysarthria. Textures assessed included ice chips, thin, puree, and regular. No signs and symtoms of aspiration noted at this time. Pt able to self feed. Implemented small bites/sips with lingual sweep to eliminate remaining oral residuals. Pt educated on the importance of slow pace for safety with breaks between bites and liquid wash down. Recomend follow up outpatient speech therapy for stregthening of oral muscles and compensatory strategies increasing self monitoring skills and decreasing care demands targeting dysarthria.  SLP Visit Diagnosis: Dysphagia, unspecified (R13.10)    Aspiration Risk       Diet Recommendation  Other  Recommendations     Follow up Recommendations Outpatient SLP      Frequency and Duration            Prognosis Prognosis for Safe Diet Advancement: Good      Swallow Study   General Date of Onset: 02/01/17 HPI: [name] a 56 y.o.male.Patient was brought by his wife for complaints of right-sided weakness withsymptoms starting Friday evening. Saturday late afternoon he developed some dysarthria with right  facial droop. When he woke up on Sunday he started to notice right-sided weakness prompting him to come to ED. In ED workup significant for subacute CVA with no evidence of hemorrhage. Type of Study: Bedside Swallow Evaluation Previous Swallow Assessment: Nurse swallowing protocall Temperature Spikes Noted: No Respiratory Status: Room air Behavior/Cognition: Alert;Cooperative;Pleasant mood Oral Cavity Assessment: Within Functional Limits Oral Care Completed by SLP: No Oral Cavity - Dentition: Dentures, top;Dentures, bottom Vision: Functional for self-feeding Self-Feeding Abilities: Able to feed self Patient Positioning: Upright in bed Baseline Vocal Quality: Normal Volitional Cough: Weak Volitional Swallow: Able to elicit    Oral/Motor/Sensory Function Overall Oral Motor/Sensory Function: Moderate impairment Facial ROM: Reduced right Facial Symmetry: Abnormal symmetry right Facial Strength: Reduced right Facial Sensation: Reduced right Lingual ROM: Within Functional Limits Lingual Symmetry: Within Functional Limits Lingual Strength: Reduced Lingual Sensation: Within Functional Limits Velum: Within Functional Limits Mandible: Within Functional Limits   Ice Chips Ice chips: Within functional limits Presentation: Spoon;Cup   Thin Liquid Thin Liquid: Within functional limits Presentation: Cup;Self Fed    Nectar Thick Nectar Thick Liquid: Not tested   Honey Thick Honey Thick Liquid: Not tested   Puree Puree: Within functional limits Presentation: Spoon;Self Fed   Solid   GO   Solid: Within functional limits Presentation: Self Fed       Thank you,  Eda Keys, SLP Graduate Clinician   [facility] 02/02/2017,2:54 PM

## 2017-02-02 NOTE — Progress Notes (Signed)
Patient's vital signs/neuro due at 0930.  Patient in MRI and ultrasound.  Will obtain when patient returns.

## 2017-02-03 ENCOUNTER — Inpatient Hospital Stay (HOSPITAL_COMMUNITY): Payer: Medicaid Other

## 2017-02-03 LAB — HEMOGLOBIN A1C
HEMOGLOBIN A1C: 7.9 % — AB (ref 4.8–5.6)
Mean Plasma Glucose: 180 mg/dL

## 2017-02-03 LAB — GLUCOSE, CAPILLARY
GLUCOSE-CAPILLARY: 147 mg/dL — AB (ref 65–99)
GLUCOSE-CAPILLARY: 157 mg/dL — AB (ref 65–99)
GLUCOSE-CAPILLARY: 162 mg/dL — AB (ref 65–99)
GLUCOSE-CAPILLARY: 217 mg/dL — AB (ref 65–99)
Glucose-Capillary: 152 mg/dL — ABNORMAL HIGH (ref 65–99)
Glucose-Capillary: 142 mg/dL — ABNORMAL HIGH (ref 65–99)

## 2017-02-03 LAB — HIV ANTIBODY (ROUTINE TESTING W REFLEX): HIV SCREEN 4TH GENERATION: NONREACTIVE

## 2017-02-03 MED ORDER — OXYCODONE HCL 5 MG PO TABS
10.0000 mg | ORAL_TABLET | Freq: Four times a day (QID) | ORAL | Status: DC | PRN
  Filled 2017-02-03: qty 2

## 2017-02-03 MED ORDER — LORAZEPAM 1 MG PO TABS
2.0000 mg | ORAL_TABLET | Freq: Once | ORAL | Status: AC
  Administered 2017-02-03: 2 mg via ORAL
  Filled 2017-02-03: qty 2

## 2017-02-03 MED ORDER — LORAZEPAM 1 MG PO TABS
1.0000 mg | ORAL_TABLET | Freq: Every evening | ORAL | Status: DC | PRN
  Administered 2017-02-03: 1 mg via ORAL
  Filled 2017-02-03: qty 1

## 2017-02-03 NOTE — Progress Notes (Signed)
Patient persists with 2 out of 5 right sided motor strength facial droop alert and oriented 3 tolerating food well but to some with physical therapy SI SNIDER [id number] DOB: November 23, 1961 DOA: 02/01/2017 PCP: Maricela Curet, MD   Physical Exam: Blood pressure (!) 127/93, pulse 80, temperature 97.9 F (36.6 C), temperature source Oral, resp. rate 18, height 6\' 6"  (1.981 m), weight 120.2 kg (265 lb), SpO2 97 %. Lungs clear to A&P no rales wheeze rhonchi appreciable heart regular rhythm no S3-S4 no heaves thrills rubs nerve exam essentially unchanged   Investigations:  No results found for this or any previous visit (from the past 240 hour(s)).   Basic Metabolic Panel:  Recent Labs  02/01/17 1300  NA 141  K 3.7  CL 106  CO2 26  GLUCOSE 212*  BUN 11  CREATININE 1.14  CALCIUM 9.7   Liver Function Tests:  Recent Labs  02/01/17 1300  AST 22  ALT 21  ALKPHOS 83  BILITOT 0.8  PROT 7.4  ALBUMIN 3.7     CBC:  Recent Labs  02/01/17 1300  WBC 8.9  NEUTROABS 6.5  HGB 14.6  HCT 43.3  MCV 87.5  PLT 222    Ct Head Wo Contrast  Result Date: 02/01/2017 CLINICAL DATA:  Pt reports slurred speech since Friday morning and difficulty speaking. ( x 3 days) Pt having weakness in left leg and right leg. Hx of TIA,HTN,DM, stroke, right total hip/bbj EXAM: CT HEAD WITHOUT CONTRAST TECHNIQUE: Contiguous axial images were obtained from the base of the skull through the vertex without intravenous contrast. COMPARISON:  04/23/2013 FINDINGS: Brain: There is focal low-attenuation within the posterior limb of the left internal capsule consistent with subacute infarct. There is no associated hemorrhage. There are mild periventricular white matter changes consistent with small vessel disease. Remote lacunar infarct of the right corona radiata/lentiform nucleus, stable in appearance. Vascular: No hyperdense vessel or unexpected calcification. Skull: Normal. Negative for fracture or focal  lesion. Sinuses/Orbits: No acute finding. Other: None IMPRESSION: 1. Probable subacute infarct of the posterior horn of the left internal capsule not associated with hemorrhage. 2. Periventricular white matter changes. 3. Remote lacunar infarct of the right corona radiata/lentiform nucleus. The salient findings were discussed with BRIAN MILLER on 02/01/2017 at 1:58 pm. Electronically Signed   By: Nolon Nations M.D.   On: 02/01/2017 13:58   Mr Brain Wo Contrast  Result Date: 02/02/2017 CLINICAL DATA:  Stroke.  Slurred speech. EXAM: MRI HEAD WITHOUT CONTRAST TECHNIQUE: Multiplanar, multiecho pulse sequences of the brain and surrounding structures were obtained without intravenous contrast. COMPARISON:  CT head 02/01/2017 FINDINGS: Brain: Acute infarct in the left basal ganglia involving putamen and posterior internal capsule fibers. This measures approximately 10 x 15 mm. No other acute infarct identified. Chronic microvascular ischemic changes in the cerebral white matter. Ventricle size and cerebral volume normal. Negative for hemorrhage or mass. Vascular: Normal arterial flow voids. Skull and upper cervical spine: Negative Sinuses/Orbits: Negative Other: None IMPRESSION: Acute infarct in the left putamen and internal capsule posteriorly. Mild to moderate chronic microvascular ischemic changes in the white matter. Electronically Signed   By: Franchot Gallo M.D.   On: 02/02/2017 10:23   US Carotid Bilateral (at [facility] Only)  Result Date: 02/02/2017 CLINICAL DATA:  Slurred speech. Right-sided weakness. Right-sided facial droop. History of CAD (post myocardial infarction, hyperlipidemia, diabetes and smoking. EXAM: BILATERAL CAROTID DUPLEX ULTRASOUND TECHNIQUE: Pearline Cables scale imaging, color Doppler and duplex ultrasound were performed of bilateral carotid  and vertebral arteries in the neck. COMPARISON:  None. FINDINGS: Criteria: Quantification of carotid stenosis is based on velocity parameters that correlate  the residual internal carotid diameter with NASCET-based stenosis levels, using the diameter of the distal internal carotid lumen as the denominator for stenosis measurement. The following velocity measurements were obtained: RIGHT ICA:  42/16 cm/sec CCA:  [id number] cm/sec SYSTOLIC ICA/CCA RATIO:  0.3 DIASTOLIC ICA/CCA RATIO:  0.6 ECA:  74 cm/sec LEFT ICA:  44/17 cm/sec CCA:  [id number] cm/sec SYSTOLIC ICA/CCA RATIO:  1.0 DIASTOLIC ICA/CCA RATIO:  1.1 ECA:  65 cm/sec RIGHT CAROTID ARTERY: There is a minimal amount intimal thickening within the right carotid bulb (images 14 and 16). There is a minimal amount of slightly irregular eccentric plaque involving the origin and proximal aspect the right internal carotid artery (is 24), not resulting in elevated peak systolic velocities within the interrogated course the right internal carotid artery to suggest a hemodynamically significant stenosis. RIGHT VERTEBRAL ARTERY:  Antegrade flow LEFT CAROTID ARTERY: There is no grayscale evidence of significant intimal thickening or atherosclerotic plaque affecting interrogated portions of the left carotid system. There are no elevated peak systolic velocity within pterygoid a course of the left internal carotid artery to suggest a hemodynamically significant stenosis. LEFT VERTEBRAL ARTERY:  Antegrade Flow IMPRESSION: 1. Minimal amount of right-sided atherosclerotic plaque, not resulting in hemodynamically significant stenosis. 2. Unremarkable sonographic evaluation of the left carotid system. Electronically Signed   By: Sandi Mariscal M.D.   On: 02/02/2017 11:19   Mr Jodene Nam Head/brain [id number] Cm  Result Date: 02/02/2017 CLINICAL DATA:  Stroke. EXAM: MRA HEAD WITHOUT CONTRAST TECHNIQUE: Angiographic images of the Circle of Willis were obtained using MRA technique without intravenous contrast. COMPARISON:  MRI head 02/02/2017 FINDINGS: Left vertebral dominant. Decreased signal in the vertebral arteries bilaterally appear to be artifactual. PICA  patent bilaterally. Basilar widely patent. Decreased signal in the distal posterior cerebral artery bilaterally appears be due to artifact and tortuosity. Moderate stenosis in the cavernous carotid on the right due to atherosclerotic disease. Left cavernous carotid appears widely patent. Anterior and middle cerebral arteries patent bilaterally. Atherosclerotic irregularity in the middle cerebral artery branches bilaterally without large vessel occlusion. IMPRESSION: Image quality degraded by artifact as above No large vessel occlusion. Moderate atherosclerotic disease in the middle cerebral artery branches bilaterally. Mild stenosis of the right cavernous carotid. Electronically Signed   By: Franchot Gallo M.D.   On: 02/02/2017 10:27      Medications:   Impression:  Active Problems:   Current smoker   Acute CVA (cerebrovascular accident) ([place])   Diabetes mellitus ([place])   HTN (hypertension)     Plan: Continue physical therapy continue sliding scale insulin coverage to B med daily 3 continue Lovenox for DVT prophylaxis continue aspirin 325 daily  Consultants: Neurology not available this week   Procedures   Antibiotics:           Time spent: 30 minutes   LOS: 2 days   Analeah Brame M   02/03/2017, 12:28 PM

## 2017-02-03 NOTE — Evaluation (Signed)
Speech Language Pathology Evaluation  Patient Details  Name: Raymond Barrera MRN: [id number] Date of Birth: [birth year]  Today's Date: 02/03/2017 Time: SLP Start Time (ACUTE ONLY): 49 SLP Stop Time (ACUTE ONLY): 1520 SLP Time Calculation (min) (ACUTE ONLY): 45 min  Past Medical History:  Past Medical History:  Diagnosis Date  . Anxiety    takes Valium daily as needed  . Arthritis   . Back pain    hx of buldging disc  . Diabetes mellitus without complication ([place])    takes Metformin daily  . Headache    migraines  . High cholesterol    takes Zocor daily  . Hypertension    takes Benazepril and HCTZ  daily  . Joint pain   . Joint swelling   . Memory impairment    occassional - from stroke  . Myocardial infarction 1987  . Pneumonia    hx of-80's  . PONV (postoperative nausea and vomiting)    took a while for him to wake up after previous anesthesia  . Shortness of breath dyspnea    with exertion  . Slurred speech   . Stroke ([place]) 08/2013  . TIA (transient ischemic attack) 2014   x 7   . Urinary frequency    Past Surgical History:  Past Surgical History:  Procedure Laterality Date  . ANKLE SURGERY  2008   left ankle-otif-[facility]  . BACK SURGERY    . JOINT REPLACEMENT     both hips replaced   . LUMBAR LAMINECTOMY/DECOMPRESSION MICRODISCECTOMY Right 10/17/2014   Procedure: LUMBAR LAMINECTOMY/DECOMPRESSION MICRODISCECTOMY 2 LEVELS;  Surgeon: [name], MD;  Location: [facility];  Service: Neurosurgery;  Laterality: Right;  Right L45 L5S1 laminectomy and foraminotomy  . MASS EXCISION  09/13/2012   Procedure: EXCISION MASS;  Surgeon: Jamesetta So, MD;  Location: [facility];  Service: General;  Laterality: N/A;  . TONSILLECTOMY    . TOTAL HIP ARTHROPLASTY Right 03/20/2015  . TOTAL HIP ARTHROPLASTY Right 03/20/2015   Procedure: TOTAL HIP ARTHROPLASTY ANTERIOR APPROACH;  Surgeon: Renette Butters, MD;  Location: [facility];  Service: Orthopedics;  Laterality: Right;  . TOTAL  HIP ARTHROPLASTY Left 01/08/2016   Procedure: TOTAL HIP ARTHROPLASTY ANTERIOR APPROACH;  Surgeon: Renette Butters, MD;  Location: [facility];  Service: Orthopedics;  Laterality: Left;   HPI:  [name] a 56 y.o.male.Patient was brought by his wife for complaints of right-sided weakness withsymptoms starting Friday evening. Saturday late afternoon he developed some dysarthria with right facial droop. When he woke up on Sunday he started to notice right-sided weakness prompting him to come to ED. In ED workup significant for subacute CVA with no evidence of hemorrhage.    Prior Functional Status  Cognitive/Linguistic Baseline: Information not available Type of Home: Apartment  Lives With: Spouse   Cognition  Overall Cognitive Status: Impaired/Different from baseline Arousal/Alertness: Awake/alert (very fatigued at the end) Orientation Level: Oriented X4 Memory: Impaired (Accomplished 5/5 on short term but struggled imediate recall) Memory Impairment: Decreased recall of new information Awareness: Appears intact Problem Solving: Appears intact Behaviors: Poor frustration tolerance (Became frustrated when unable to draw a clock) Safety/Judgment: Appears intact  Comprehension  Auditory Comprehension Overall Auditory Comprehension: Impaired Yes/No Questions: Within Functional Limits Commands: Within Functional Limits Conversation: Complex Other Conversation Comments: On MoCA unable to comprehend directions EffectiveTechniques: Extra processing time;Pausing;Repetition;Slowed speech Visual Recognition/Discrimination Discrimination: Within Function Limits Reading Comprehension Reading Status: Not tested  Expression  Expression Primary Mode of Expression: Verbal Verbal Expression Overall Verbal Expression:  Impaired Initiation: No impairment Level of Generative/Spontaneous Verbalization: Conversation Repetition: Impaired Level of Impairment: Sentence level Naming: No  impairment Pragmatics: No impairment Interfering Components: Speech intelligibility;Anatomical limitation (inactive soft palate) Written Expression Dominant Hand: Right  Oral/Motor  Oral Motor/Sensory Function Overall Oral Motor/Sensory Function: Moderate impairment Facial ROM: Reduced right Facial Symmetry: Abnormal symmetry right Facial Strength: Reduced right Facial Sensation: Within Functional Limits Lingual ROM: Within Functional Limits Lingual Symmetry: Abnormal symmetry right Lingual Strength: Within Functional Limits Lingual Sensation: Within Functional Limits Velum: Impaired right;Impaired left Mandible: Within Functional Limits Motor Speech Overall Motor Speech: Impaired Respiration: Within functional limits Phonation: Low vocal intensity Resonance: Hypernasality Articulation: Impaired Level of Impairment: Word Intelligibility: Intelligibility reduced Word: 25-49% accurate Phrase: 25-49% accurate Sentence: 0-24% accurate Conversation: 0-24% accurate Motor Planning: Witnin functional limits Motor Speech Errors: Not applicable    Assessment/Plan  Patient Active Problem List   Diagnosis Date Noted  . Acute CVA (cerebrovascular accident) ([place]) 02/01/2017  . Diabetes mellitus ([employer]) 02/01/2017  . HTN (hypertension) 02/01/2017  . Primary osteoarthritis of left hip 01/08/2016  . DJD (degenerative joint disease) 03/20/2015  . Primary osteoarthritis of right hip 02/26/2015  . Lumbar spondylosis 10/17/2014  . Lumbago 07/25/2014  . Abnormality of gait 07/25/2014  . Difficulty in walking(719.7) 07/25/2014  . TIA (transient ischemic attack) 03/09/2013  . Cocaine abuse 03/09/2013  . Accelerated hypertension 03/09/2013  . Hyperlipidemia 03/09/2013  . Current smoker 03/09/2013   Assessment / Plan / Recommendation Clinical Impression  Pt assessed using the Montreal Cognitive Assessment (MoCA) for cognitive linguistic deficits. Pt presents with mild/moderate cognitive  linguistic deficits characterized by deficits in attention, processing, and dysarthria. Pt demonstrates difficulty with attention tasks (numerical subtraction of 7 from 90), following directions, repetition of sentences, and complex reasoning. The Pt exhibited appropriate short term memory and orientation. Speech intelligibility was noted to be severely impaired due to dysarthria characterized by hypernasality and imprecise articulation. Dysarthria noted to be more identifiable today and repeat oral motor exam showed diminished soft palate elevation. Also noted that changes were present in right hand use since assessment yesterday. Limited strength and ROM in right handedness noted today. Recommend continuation of skilled SLP services to target cognitive linguistic deficits and providing strengthening of all oral muscles. Pt will benefit from skilled SLP in order to address the above impairments, maximize independence, and decrease burden of care.  SLP Visit Diagnosis: Cognitive communication deficit [id number])    Thank you,   Eda Keys, SLP Graduate Clinician   SLP - End of Session Activity Tolerance: Patient tolerated treatment well       [facility] 02/03/2017, 4:22 PM

## 2017-02-03 NOTE — Progress Notes (Signed)
Physical Therapy Treatment Patient Details Name: Raymond Barrera MRN: [id number] DOB: August 09, 1961 Today's Date: 02/03/2017    History of Present Illness Raymond Barrera  is a 56 y.o. 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 when he woke up was started to notice right-sided weakness which prompted her to bring him to ED, she reports patient has been compliant with his medication, occluding blood pressure, statins, and he did take full dose aspirin this a.m., in ED workup significant for subacute CVA with no evidence of hemorrhage,    PT Comments    Pt received in bed, wife and pt's mother present, and pt is agreeable to PT tx.  Wife expressed concern that she felt his speech was worse today compared to yesterday, and pt has been having extreme pain due to muscle spasms in his LE's -mainly in his hip area.  Wife informed PT and RN that he had fallen yesterday with noted abrasion on R knee.  During PT tx session vitals were as follows: BP: 127/93, HR: 80bpm, and SpO2: 97% on RA.  Pt was modified independent with transferring supine<>sit.  Once seated on the EOB he was able to perform some LE exercises, however, this was limited due to intense spasms noted where pt's entire body would tense up, and pt seemed to perform valsalva and needed cues to take a breath each time the spasm occurred.  Pt requested to attempt standing, and was able to do so with Min guard and the RW.  He initially ambulated 39ft with the RW at [facility] guard in the room, then took a seated rest break on the  EOB.  He then was able to ambulate 163ft with RW and Min guard/Min A in the hallway.  He had 1 instance of slight R knee buckling, but he was able to self correct.  Continue to recommend OPPT at this time to continue progression with balance, strength and endurance.   Follow Up Recommendations  Outpatient PT     Equipment Recommendations  None recommended by PT    Recommendations for Other Services       Precautions / Restrictions Precautions Precautions: Fall Precaution Comments: No falls, but 2 near-misses - 1) in the bathroom and lost balance - wife caught him.  2) after getting OOB - caught himself on the wall.   Restrictions Weight Bearing Restrictions: No    Mobility  Bed Mobility Overal bed mobility: Modified Independent             General bed mobility comments: Upon sitting on the EOB pt experiences multiple "spasms" in B hips.  He requires vc's to breathe during these to assist with relaxation.    Transfers Overall transfer level: Needs assistance Equipment used: Rolling walker (2 wheeled) Transfers: Sit to/from Stand   Stand pivot transfers: Min guard       General transfer comment: vc's to reach back for what he was going to sit on.  Pt demonstrated poor eccentric control when going to sit down on the bed, but this improved when sitting down in the chair.  Wife reports that he  is a "plopper" at home.   Ambulation/Gait Ambulation/Gait assistance: Min guard;Min assist Ambulation Distance (Feet): 115 Feet Assistive device: Rolling walker (2 wheeled) Gait Pattern/deviations: Step-through pattern;Decreased step length - right;Decreased step length - left     General Gait Details: pt demonstrates 1 episode of slight R knee buckling which he was able to self correct during gait.  Cues to take turns slowly.     Stairs            Wheelchair Mobility    Modified Rankin (Stroke Patients Only)       Balance Overall balance assessment: Needs  assistance Sitting-balance support: Bilateral upper extremity supported;Feet supported Sitting balance-Leahy Scale: Good     Standing balance support: Bilateral upper extremity supported Standing balance-Leahy Scale: Fair                      Cognition Arousal/Alertness: Awake/alert Behavior During Therapy: WFL for tasks assessed/performed Overall Cognitive Status: Within Functional Limits for tasks assessed                 General Comments: Wife reports that his speech is more slurred today than yesterday.  Difficult to determine at this time - maybe slightly worse than yesterday.    Exercises General Exercises - Lower Extremity Ankle Circles/Pumps: Strengthening;Both;Seated;Other reps (comment) (x1 min) Long Arc Quad: Strengthening;Both;10 reps;Seated;Limitations Long [employer] Limitations: Increased initiation time for L LE - which he states is baseline after his THA on that side.      General Comments        Pertinent Vitals/Pain Pain Assessment: 0-10 Pain Score: 5  Pain Location: B hips R > L Pain Descriptors / Indicators: Cramping Pain Intervention(s): Limited activity within patient's tolerance;Monitored during session;Repositioned    Home Living                      Prior Function            PT Goals (current goals can now be found in the care plan section) Acute Rehab PT Goals Patient Stated Goal: to go home PT Goal Formulation: With patient/family Time For Goal Achievement: 02/09/17 Potential to Achieve Goals: Good Progress towards PT goals: Progressing toward goals    Frequency    7X/week      PT Plan Current plan remains appropriate    Co-evaluation             End of Session Equipment Utilized During Treatment: Gait belt Activity Tolerance: Patient tolerated treatment well Patient left: in chair;with family/visitor present Nurse Communication: Mobility status PT Visit Diagnosis: Other abnormalities of gait and  mobility (R26.89);Muscle weakness (generalized) (M62.81);Ataxic gait (R26.0)     Time: [id number] PT Time Calculation (min) (ACUTE ONLY): 48 min  Charges:  $Gait Training: 8-22 mins $Therapeutic Exercise: 8-22 mins $Therapeutic Activity: 8-22 mins                    G Codes:       Beth Tinna Kolker, PT, DPT X: [phone]

## 2017-02-04 ENCOUNTER — Inpatient Hospital Stay (HOSPITAL_COMMUNITY): Payer: Medicaid Other

## 2017-02-04 LAB — CBC
HCT: 44 % (ref 39.0–52.0)
Hemoglobin: 15.2 g/dL (ref 13.0–17.0)
MCH: 29.6 pg (ref 26.0–34.0)
MCHC: 34.5 g/dL (ref 30.0–36.0)
MCV: 85.6 fL (ref 78.0–100.0)
PLATELETS: 238 10*3/uL (ref 150–400)
RBC: 5.14 MIL/uL (ref 4.22–5.81)
RDW: 14 % (ref 11.5–15.5)
WBC: 11.2 10*3/uL — AB (ref 4.0–10.5)

## 2017-02-04 LAB — BASIC METABOLIC PANEL
Anion gap: 8 (ref 5–15)
BUN: 14 mg/dL (ref 6–20)
CO2: 25 mmol/L (ref 22–32)
Calcium: 9.9 mg/dL (ref 8.9–10.3)
Chloride: 104 mmol/L (ref 101–111)
Creatinine, Ser: 1.01 mg/dL (ref 0.61–1.24)
Glucose, Bld: 159 mg/dL — ABNORMAL HIGH (ref 65–99)
POTASSIUM: 3.8 mmol/L (ref 3.5–5.1)
SODIUM: 137 mmol/L (ref 135–145)

## 2017-02-04 LAB — GLUCOSE, CAPILLARY
GLUCOSE-CAPILLARY: 122 mg/dL — AB (ref 65–99)
GLUCOSE-CAPILLARY: 124 mg/dL — AB (ref 65–99)
GLUCOSE-CAPILLARY: 151 mg/dL — AB (ref 65–99)
GLUCOSE-CAPILLARY: 158 mg/dL — AB (ref 65–99)
Glucose-Capillary: 127 mg/dL — ABNORMAL HIGH (ref 65–99)
Glucose-Capillary: 120 mg/dL — ABNORMAL HIGH (ref 65–99)

## 2017-02-04 NOTE — Progress Notes (Signed)
Occupational Therapy Treatment Patient Details Name: Raymond Barrera MRN: [id number] DOB: 07/23/1961 Today's Date: 02/04/2017    History of present illness Raymond Barrera  is a 56 y.o. 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 when he woke up was started to notice right-sided weakness which prompted her to bring him to ED, she reports patient has been compliant with his medication, occluding blood pressure, statins, and he did take full dose aspirin this a.m., in ED workup significant for subacute CVA with no evidence of hemorrhage,   OT comments  Patient agreeable to participate in OT session. Session focused on RUE ROM exercises to increase strength and endurance needed for daily tasks. Patient was presented with HEP handout. Wife and patient verbalized understanding and patient was able to demonstrate exercises to therapist.   Follow Up Recommendations  Outpatient OT    Equipment Recommendations  None recommended by OT       Precautions / Restrictions Precautions Precautions: Fall Precaution Comments: No falls, but 2 near-misses - 1) in the bathroom and lost balance - wife caught him.  2) after getting OOB - caught himself on the wall.   Restrictions Weight Bearing Restrictions: No       Mobility Bed Mobility Overal bed mobility: Modified Independent                                                   Cognition   Behavior During Therapy: WFL for tasks assessed/performed Overall Cognitive Status: Impaired/Different from baseline                         Exercises General Exercises - Upper Extremity Shoulder Flexion: AROM;Right;10 reps;Seated Shoulder ABduction: AROM;Right;10 reps;Seated Shoulder ADduction: AROM;Right;10 reps;Seated Shoulder Horizontal ABduction: AROM;Right;10 reps;Seated Shoulder Horizontal ADduction: AROM;Right;10 reps;Seated Elbow Flexion: AROM;Right;15 reps;Seated Elbow Extension: AROM;Right;15 reps;Seated Wrist Flexion: AROM;Right;15 reps;Seated Wrist Extension: AROM;Right;15 reps;Seated Digit Composite Flexion: AROM;Right Composite Extension: AROM;Right;15 reps;Seated Other Exercises Other Exercises: Discussed continued use of right hand for all activities as able to.         Frequency  Min 2X/week        Progress Toward Goals  OT Goals(current goals can now be found in the care plan section)  Progress towards OT goals: Progressing toward goals     Plan Discharge plan remains appropriate;Frequency remains appropriate       End of Session    OT Visit Diagnosis: Muscle weakness (generalized) (M62.81)   Activity Tolerance Patient tolerated treatment well   Patient Left in bed;with call bell/phone within reach;with family/visitor present           Time: 0321-2248 OT Time Calculation (min): 23 min  Charges: OT General Charges $OT Visit: 1 Procedure OT Treatments $Therapeutic Exercise: 23-37 mins  Ailene Ravel, OTR/L,CBIS  [phone]    Kellin Fifer, Clarene Duke 02/04/2017, 2:15 PM

## 2017-02-04 NOTE — Progress Notes (Signed)
Patient alert and oriented eating well so with 2 out of 5 motor strength on right side working with physical therapy patient hemodynamically stable with normal glycemic control will consider discharge tomorrow with home health care and physical therapy KHADIR ROAM KAJ:[id number] DOB: 1961/04/05 DOA: 02/01/2017 PCP: Maricela Curet, MD   Physical Exam: Blood pressure (!) 136/96, pulse 80, temperature 98 F (36.7 C), temperature source Oral, resp. rate 18, height 6\' 6"  (1.981 m), weight 120.2 kg (265 lb), SpO2 97 %. Lungs clear to A&P no rales wheeze rhonchi heart regular no S3-S4 no heaves thrills rubs abdomen soft nontender bowel sounds normoactive   Investigations:  No results found for this or any previous visit (from the past 240 hour(s)).   Basic Metabolic Panel:  Recent Labs  02/04/17 0505  NA 137  K 3.8  CL 104  CO2 25  GLUCOSE 159*  BUN 14  CREATININE 1.01  CALCIUM 9.9   Liver Function Tests: No results for input(s): AST, ALT, ALKPHOS, BILITOT, PROT, ALBUMIN in the last 72 hours.   CBC:  Recent Labs  02/04/17 0505  WBC 11.2*  HGB 15.2  HCT 44.0  MCV 85.6  PLT 238    Dg Hip Unilat With Pelvis 1v Right  Result Date: 02/03/2017 CLINICAL DATA:  Right hip pain since insert. EXAM: DG HIP (WITH OR WITHOUT PELVIS) 1V RIGHT COMPARISON:  None. 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 No fracture or dislocation. Mild heterotopic ossification adjacent to the superolateral right acetabulum. Prior left total hip arthroplasty partially visualized. IMPRESSION: No acute injury of the right hip. Electronically Signed   By: Kathreen Devoid   On: 02/03/2017 16:27      Medications:   Impression: Active Problems:   Current smoker   Acute CVA (cerebrovascular accident) ([place])   Diabetes mellitus ([place])   HTN (hypertension)     Plan:X-ray right hip consider discharge in a.m. with home health nurse and physical therapy   Consultants: Neurology  not available this week   Procedures   Antibiotics:           Time spent: 30 minutes   LOS: 3 days   Eaton Folmar M   02/04/2017, 1:09 PM

## 2017-02-04 NOTE — Progress Notes (Addendum)
Physical Therapy Treatment Patient Details Name: Raymond Barrera MRN: [id number] DOB: Dec 01, 1961 Today's Date: 02/04/2017    History of Present Illness Raymond Barrera  is a 56 y.o. 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 when he woke up was started to notice right-sided weakness which prompted her to bring him to ED, she reports patient has been compliant with his medication, occluding blood pressure, statins, and he did take full dose aspirin this a.m., in ED workup significant for subacute CVA with no evidence of hemorrhage,    PT Comments    Pt received in bed, wife present, and pt is very excited to participate in PT.  He was able to increase gait distance to 46ft with RW as well as navigate through tight and winding space of the family waiting room on the floor with supervision/min guard.  He was able to participate in higher level standing exercises with B UE supported on the RW.  Wife expressed that they may do some home health initially, however I expressed to her that I strongly recommend that pt go straight to outpatient PT to continue with higher level balance and strengthening activities to optimize his return to PLOF.   Follow Up Recommendations  Outpatient PT     Equipment Recommendations  None recommended by PT    Recommendations for Other Services       Precautions / Restrictions Precautions Precautions: Fall Precaution Comments: No falls, but 2 near-misses - 1) in the bathroom and lost balance - wife caught him.  2) after getting OOB -  caught himself on the wall.   Restrictions Weight Bearing Restrictions: No    Mobility  Bed Mobility Overal bed mobility: Modified Independent                Transfers Overall transfer level: Modified independent Equipment used: Rolling walker (2 wheeled) Transfers: Sit to/from Stand Sit to Stand: Modified independent (Device/Increase time)            Ambulation/Gait Ambulation/Gait assistance: Supervision;Min guard Ambulation Distance (Feet): 400 Feet Assistive device: Rolling walker (2 wheeled) Gait Pattern/deviations: Step-through pattern     General Gait Details: Pt was able to negotiate through the family waiting room without difficulties.     Stairs            Wheelchair Mobility    Modified Rankin (Stroke Patients Only)       Balance Overall balance assessment: Needs assistance Sitting-balance support: Feet supported;No upper extremity supported Sitting balance-Leahy Scale: Good     Standing balance support: Bilateral upper extremity supported Standing balance-Leahy Scale: Fair                      Cognition Arousal/Alertness: Awake/alert Behavior During Therapy: WFL for tasks assessed/performed Overall Cognitive Status: Within Functional Limits for tasks assessed                      Exercises Total Joint Exercises Marching in Standing: Both;Strengthening;10 reps;Standing;Other (comment) (B UE support on RW) Standing Hip Extension: Strengthening;Both;10 reps;Standing;Other (comment) (B UE support on RW) General  Exercises - Upper Extremity Shoulder Flexion: AROM;Right;10 reps;Seated Shoulder ABduction: AROM;Right;10 reps;Seated Shoulder ADduction: AROM;Right;10 reps;Seated Shoulder Horizontal ABduction: AROM;Right;10 reps;Seated Shoulder Horizontal ADduction: AROM;Right;10 reps;Seated Elbow Flexion: AROM;Right;15 reps;Seated Elbow Extension: AROM;Right;15 reps;Seated Wrist Flexion: AROM;Right;15 reps;Seated Wrist  Extension: AROM;Right;15 reps;Seated Digit Composite Flexion: AROM;Right Composite Extension: AROM;Right;15 reps;Seated General Exercises - Lower Extremity Heel Raises: Strengthening;Both;10 reps;Standing;Other (comment) (B UE support on RW) Mini-Sqauts: Both;10 reps;Standing;Strengthening;Other (comment) (B UE support on RW) Other Exercises Other Exercises: 5 x sit<>stand with UE's crossed over his chest.     General Comments        Pertinent Vitals/Pain Pain Location: Pt states he has some discomfort in his R hip, but it is better than yesterday.     Home Living                      Prior Function            PT Goals (current goals can now be found in the care plan section) Acute Rehab PT Goals Patient Stated Goal: to go home PT Goal Formulation: With patient/family Time For Goal Achievement: 02/09/17 Potential to Achieve Goals: Good Progress towards PT goals: Progressing toward goals    Frequency    7X/week      PT Plan Current plan remains appropriate    Co-evaluation             End of Session Equipment Utilized During Treatment: Gait belt Activity Tolerance: Patient tolerated treatment well Patient left: in chair;with family/visitor present Nurse Communication: Mobility status PT Visit Diagnosis: Other abnormalities of gait and mobility (R26.89);Muscle weakness (generalized) (M62.81);Ataxic gait (R26.0)     Time: 2197-5883 PT Time Calculation (min) (ACUTE ONLY): 27 min  Charges:  $Gait Training: 8-22 mins $Therapeutic Exercise: 8-22 mins                    G Codes:       Raymond Barrera, PT, DPT X: [phone]

## 2017-02-05 LAB — GLUCOSE, CAPILLARY
GLUCOSE-CAPILLARY: 129 mg/dL — AB (ref 65–99)
GLUCOSE-CAPILLARY: 155 mg/dL — AB (ref 65–99)
Glucose-Capillary: 142 mg/dL — ABNORMAL HIGH (ref 65–99)
Glucose-Capillary: 100 mg/dL — ABNORMAL HIGH (ref 65–99)

## 2017-02-05 LAB — BASIC METABOLIC PANEL
ANION GAP: 9 (ref 5–15)
BUN: 13 mg/dL (ref 6–20)
CHLORIDE: 104 mmol/L (ref 101–111)
CO2: 25 mmol/L (ref 22–32)
Calcium: 10 mg/dL (ref 8.9–10.3)
Creatinine, Ser: 1.01 mg/dL (ref 0.61–1.24)
GFR calc Af Amer: >60 mL/min (ref >60)
GFR calc non Af Amer: >60 mL/min (ref >60)
Glucose, Bld: 131 mg/dL — ABNORMAL HIGH (ref 65–99)
POTASSIUM: 4.1 mmol/L (ref 3.5–5.1)
SODIUM: 138 mmol/L (ref 135–145)

## 2017-02-05 MED ORDER — ASPIRIN 325 MG PO TABS: 325.0000 mg | ORAL_TABLET | Freq: Every day | ORAL | 11 refills | Status: DC

## 2017-02-05 NOTE — Progress Notes (Signed)
Pt IV and telemetry removed, tolerated well.  Reviewed discharge instructions with pt and wife at bedside, answered all questions at this time.

## 2017-02-05 NOTE — Care Management Note (Signed)
Case Management Note  Patient Details  Name: MEHUL RUDIN MRN: [id number] Date of Birth: 03-22-1961   Expected Discharge Date:  02/05/17               Expected Discharge Plan:  Home/Self Care  In-House Referral:  NA  Discharge planning Services  CM Consult  Post Acute Care Choice:  NA Choice offered to:  NA  Status of Service:  Completed, signed off  Additional Comments: Pt discharging home today with self care. Wife at bedside [facility] has been ordered but pt would like to do OP PT/OT/SLP, MD aware. Referrals for OP therapy have been made.   Sherald Barge, RN 02/05/2017, 2:12 PM

## 2017-02-05 NOTE — Discharge Summary (Signed)
Physician Discharge Summary  LADD CEN PJA:[id number] DOB: [birth year] DOA: 02/01/2017  PCP: Maricela Curet, MD  Admit date: 02/01/2017 Discharge date: 02/05/2017   Recommendations for Outpatient Follow-up:  Patient is advised to return home to take a full dose 325 mg aspirin daily instead of his 81 g daily to purchase of a with physical therapy at home for strengthening and ambulation to take all other prehospital admission medicines including blood pressure and anti-lipidemic medicines and follow-up in my office in 1-2 weeks' time to assess progress and hemodynamics Discharge Diagnoses:  Active Problems:   Current smoker   Acute CVA (cerebrovascular accident) ([place])   Diabetes mellitus ([place])   HTN (hypertension)   Discharge Condition: Stable  Filed Weights   02/01/17 1300  Weight: 120.2 kg (265 lb)    History of present illness:  The patient is a 56 year old black male with multiple risk factors for vascular disease including type 2 diabetes hypertension hyperlipidemia history of cocaine abuse who had significant lumbago issues was preoperative for lumbar laminectomy he apparently had a left-sided internal capsule CVA which was nonhemorrhagic producing a right-sided hemiparesis is evaluated promptly by speech therapy had no significant swallowing issues he had an MRA MRI of brain as well as 2-D echo and duplex ultrasound of carotid system the good news is these revealed no significant stenosis of any of the branches of the cerebral circulation other than the ones involved there were some minor irregularities but nothing of great consequence he was placed on aspirin 325 a day given physical therapy and speech therapy and continue to do well both he and wife expressed desire to return home and continue physical therapy there  Hospital Course:  See history of present illness above  Procedures:    Consultations:  Speech therapy and physical therapy  Discharge  Instructions  Discharge Instructions    Ambulatory referral to Occupational Therapy    Complete by:  As directed    Ambulatory referral to Physical Therapy    Complete by:  As directed    Ambulatory referral to Speech Therapy    Complete by:  As directed    Face-to-face encounter (required for Medicare/Medicaid patients)    Complete by:  As directed    I Eivan Gallina M certify that this patient is under my care and that I, or a nurse practitioner or physician's assistant working with me, had a face-to-face encounter that meets the physician face-to-face encounter requirements with this patient on 02/05/2017. The encounter with the patient was in whole, or in part for the following medical condition(s) which is the primary reason for home health care (List medical condition): New CVA with right sided hemiparesis   The encounter with the patient was in whole, or in part, for the following medical condition, which is the primary reason for home health care:  CVA with Right sided hemiparesis   I certify that, based on my findings, the following services are medically necessary home health services:   Physical therapy Nursing     Reason for Medically Necessary Home Health Services:  Skilled Nursing- Change/Decline in Patient Status   My clinical findings support the need for the above services:  Can transfer bed to chair only   Further, I certify that my clinical findings support that this patient is homebound due to:  Unsafe ambulation due to balance issues   Home Health    Complete by:  As directed    To provide the following care/treatments:   PT  Home Health Aide        Allergies  Allergen Reactions  . Poison Ivy Extract [Poison Ivy Extract] Itching and Rash      The results of significant diagnostics from this hospitalization (including imaging, microbiology, ancillary and laboratory) are listed below for reference.    Significant Diagnostic Studies: Ct Head Wo  Contrast  Result Date: 02/01/2017 CLINICAL DATA:  Pt reports slurred speech since Friday morning and difficulty speaking. ( x 3 days) Pt having weakness in left leg and right leg. Hx of TIA,HTN,DM, stroke, right total hip/bbj EXAM: CT HEAD WITHOUT CONTRAST TECHNIQUE: Contiguous axial images were obtained from the base of the skull through the vertex without intravenous contrast. COMPARISON:  04/23/2013 FINDINGS: Brain: There is focal low-attenuation within the posterior limb of the left internal capsule consistent with subacute infarct. There is no associated hemorrhage. There are mild periventricular white matter changes consistent with small vessel disease. Remote lacunar infarct of the right corona radiata/lentiform nucleus, stable in appearance. Vascular: No hyperdense vessel or unexpected calcification. Skull: Normal. Negative for fracture or focal lesion. Sinuses/Orbits: No acute finding. Other: None IMPRESSION: 1. Probable subacute infarct of the posterior horn of the left internal capsule not associated with hemorrhage. 2. Periventricular white matter changes. 3. Remote lacunar infarct of the right corona radiata/lentiform nucleus. The salient findings were discussed with BRIAN MILLER on 02/01/2017 at 1:58 pm. Electronically Signed   By: Nolon Nations M.D.   On: 02/01/2017 13:58   Mr Brain Wo Contrast  Result Date: 02/02/2017 CLINICAL DATA:  Stroke.  Slurred speech. EXAM: MRI HEAD WITHOUT CONTRAST TECHNIQUE: Multiplanar, multiecho pulse sequences of the brain and surrounding structures were obtained without intravenous contrast. COMPARISON:  CT head 02/01/2017 FINDINGS: Brain: Acute infarct in the left basal ganglia involving putamen and posterior internal capsule fibers. This measures approximately 10 x 15 mm. No other acute infarct identified. Chronic microvascular ischemic changes in the cerebral white matter. Ventricle size and cerebral volume normal. Negative for hemorrhage or mass. Vascular:  Normal arterial flow voids. Skull and upper cervical spine: Negative Sinuses/Orbits: Negative Other: None IMPRESSION: Acute infarct in the left putamen and internal capsule posteriorly. Mild to moderate chronic microvascular ischemic changes in the white matter. Electronically Signed   By: Franchot Gallo M.D.   On: 02/02/2017 10:23   US Carotid Bilateral (at [facility] And [facility] Only)  Result Date: 02/02/2017 CLINICAL DATA:  Slurred speech. Right-sided weakness. Right-sided facial droop. History of CAD (post myocardial infarction, hyperlipidemia, diabetes and smoking. EXAM: BILATERAL CAROTID DUPLEX ULTRASOUND TECHNIQUE: Pearline Cables scale imaging, color Doppler and duplex ultrasound were performed of bilateral carotid and vertebral arteries in the neck. COMPARISON:  None. FINDINGS: Criteria: Quantification of carotid stenosis is based on velocity parameters that correlate the residual internal carotid diameter with NASCET-based stenosis levels, using the diameter of the distal internal carotid lumen as the denominator for stenosis measurement. The following velocity measurements were obtained: RIGHT ICA:  42/16 cm/sec CCA:  630/16 cm/sec SYSTOLIC ICA/CCA RATIO:  0.3 DIASTOLIC ICA/CCA RATIO:  0.6 ECA:  74 cm/sec LEFT ICA:  44/17 cm/sec CCA:  01/09 cm/sec SYSTOLIC ICA/CCA RATIO:  1.0 DIASTOLIC ICA/CCA RATIO:  1.1 ECA:  65 cm/sec RIGHT CAROTID ARTERY: There is a minimal amount intimal thickening within the right carotid bulb (images 14 and 16). There is a minimal amount of slightly irregular eccentric plaque involving the origin and proximal aspect the right internal carotid artery (is 24), not resulting in elevated peak systolic velocities within the interrogated course the  right internal carotid artery to suggest a hemodynamically significant stenosis. RIGHT VERTEBRAL ARTERY:  Antegrade flow LEFT CAROTID ARTERY: There is no grayscale evidence of significant intimal thickening or atherosclerotic plaque affecting interrogated  portions of the left carotid system. There are no elevated peak systolic velocity within pterygoid a course of the left internal carotid artery to suggest a hemodynamically significant stenosis. LEFT VERTEBRAL ARTERY:  Antegrade Flow IMPRESSION: 1. Minimal amount of right-sided atherosclerotic plaque, not resulting in hemodynamically significant stenosis. 2. Unremarkable sonographic evaluation of the left carotid system. Electronically Signed   By: Sandi Mariscal M.D.   On: 02/02/2017 11:19   Mr Jodene Nam Head/brain ZO Cm  Result Date: 02/02/2017 CLINICAL DATA:  Stroke. EXAM: MRA HEAD WITHOUT CONTRAST TECHNIQUE: Angiographic images of the Circle of Willis were obtained using MRA technique without intravenous contrast. COMPARISON:  MRI head 02/02/2017 FINDINGS: Left vertebral dominant. Decreased signal in the vertebral arteries bilaterally appear to be artifactual. PICA patent bilaterally. Basilar widely patent. Decreased signal in the distal posterior cerebral artery bilaterally appears be due to artifact and tortuosity. Moderate stenosis in the cavernous carotid on the right due to atherosclerotic disease. Left cavernous carotid appears widely patent. Anterior and middle cerebral arteries patent bilaterally. Atherosclerotic irregularity in the middle cerebral artery branches bilaterally without large vessel occlusion. IMPRESSION: Image quality degraded by artifact as above No large vessel occlusion. Moderate atherosclerotic disease in the middle cerebral artery branches bilaterally. Mild stenosis of the right cavernous carotid. Electronically Signed   By: Franchot Gallo M.D.   On: 02/02/2017 10:27   Dg Hip Unilat With Pelvis 1v Right  Result Date: 02/03/2017 CLINICAL DATA:  Right hip pain since insert. EXAM: DG HIP (WITH OR WITHOUT PELVIS) 1V RIGHT COMPARISON:  None. 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 No fracture or dislocation. Mild heterotopic ossification adjacent to the  superolateral right acetabulum. Prior left total hip arthroplasty partially visualized. IMPRESSION: No acute injury of the right hip. Electronically Signed   By: Kathreen Devoid   On: 02/03/2017 16:27   Dg Femur 1v Right  Result Date: 02/04/2017 CLINICAL DATA:  Pain extending from the right hip to the right knee after eating is leg on a wheelchair. EXAM: RIGHT FEMUR 1 VIEW COMPARISON:  None in PACs FINDINGS: An AP view of the entire right femur is reviewed. There is a prosthetic right hip joint. There is no evidence of loosening or infection. The femoral shaft appears intact with no lytic or blastic lesion. There is mild degenerative change centered on the medial joint compartment of the right knee. There is no periosteal reaction observed. The soft tissues of the thigh exhibit no acute abnormalities. IMPRESSION: No acute bony abnormality of the right femur. Electronically Signed   By: David  Martinique M.D.   On: 02/04/2017 14:13    Microbiology: No results found for this or any previous visit (from the past 240 hour(s)).   Labs: Basic Metabolic Panel:  Recent Labs Lab 02/01/17 1300 02/04/17 0505 02/05/17 0432  NA 141 137 138  K 3.7 3.8 4.1  CL 106 104 104  CO2 26 25 25   GLUCOSE 212* 159* 131*  BUN 11 14 13   CREATININE 1.14 1.01 1.01  CALCIUM 9.7 9.9 10.0   Liver Function Tests:  Recent Labs Lab 02/01/17 1300  AST 22  ALT 21  ALKPHOS 83  BILITOT 0.8  PROT 7.4  ALBUMIN 3.7   No results for input(s): LIPASE, AMYLASE in the last 168 hours. No results for input(s):  AMMONIA in the last 168 hours. CBC:  Recent Labs Lab 02/01/17 1300 02/04/17 0505  WBC 8.9 11.2*  NEUTROABS 6.5  --   HGB 14.6 15.2  HCT 43.3 44.0  MCV 87.5 85.6  PLT 222 238   Cardiac Enzymes: No results for input(s): CKTOTAL, CKMB, CKMBINDEX, TROPONINI in the last 168 hours. BNP: BNP (last 3 results) No results for input(s): BNP in the last 8760 hours.  ProBNP (last 3 results) No results for input(s):  PROBNP in the last 8760 hours.  CBG:  Recent Labs Lab 02/04/17 2046 02/05/17 0027 02/05/17 0458 02/05/17 0738 02/05/17 1123  GLUCAP 151* 129* 142* 155* 100*       Signed:  Paisely Brick M  [facility] Pager: [phone] 02/05/2017, 12:41 PM

## 2017-02-17 ENCOUNTER — Encounter (HOSPITAL_COMMUNITY): Payer: Self-pay | Admitting: Physical Therapy

## 2017-02-17 ENCOUNTER — Ambulatory Visit (HOSPITAL_COMMUNITY): Payer: Medicaid Other | Admitting: Speech Pathology

## 2017-02-17 ENCOUNTER — Ambulatory Visit (HOSPITAL_COMMUNITY): Payer: Medicaid Other | Admitting: Specialist

## 2017-02-17 ENCOUNTER — Ambulatory Visit (HOSPITAL_COMMUNITY): Payer: Medicaid Other | Attending: Family Medicine | Admitting: Physical Therapy

## 2017-02-17 ENCOUNTER — Encounter (HOSPITAL_COMMUNITY): Payer: Self-pay | Admitting: Speech Pathology

## 2017-02-17 DIAGNOSIS — R2689 Other abnormalities of gait and mobility: Secondary | ICD-10-CM | POA: Insufficient documentation

## 2017-02-17 DIAGNOSIS — R262 Difficulty in walking, not elsewhere classified: Secondary | ICD-10-CM

## 2017-02-17 DIAGNOSIS — I69822 Dysarthria following other cerebrovascular disease: Secondary | ICD-10-CM

## 2017-02-17 DIAGNOSIS — M6281 Muscle weakness (generalized): Secondary | ICD-10-CM | POA: Insufficient documentation

## 2017-02-17 DIAGNOSIS — R2681 Unsteadiness on feet: Secondary | ICD-10-CM | POA: Diagnosis present

## 2017-02-17 DIAGNOSIS — R41841 Cognitive communication deficit: Secondary | ICD-10-CM | POA: Insufficient documentation

## 2017-02-17 DIAGNOSIS — R278 Other lack of coordination: Secondary | ICD-10-CM | POA: Diagnosis present

## 2017-02-17 NOTE — Patient Instructions (Signed)
Fine Motor Coordination Exercises  Perform the following exercises 2-3 times a day, as recommended by your occupational therapist.   Close all fingers and thumb into a tight fist and then open wide. (10 times)  Palm of hand on table, spread fingers apart, then together. (10 times)  Lift fingers and thumb off table one at a time. Increase speed as able. (10 times)  Touch thumb to each fingertip. Increase speed as able. (10 times)  Thumb circles. (10 times)  Pick up 5 small objects (coins, marbles, paperclips, beads, etc.) one at a time and hold them in hand, then place them one by one onto the table.  Pick up small objects and place them into a cup or container.  Thread buttons or beads onto a string.  Place clothespins onto the edge of a cup, can, or container.  Play card games. Practice shuffling and dealing cards. Flip cards over onto table one by one.  Practice screwing and unscrewing nuts/bolts.  Stack approximately [employer] (checkers, coins, etc.) onto table.  Use scissors to cut paper.   Practice writing your name, copying from a magazine, newspaper etc

## 2017-02-17 NOTE — Therapy (Signed)
[place], [place], [address] Phone: [phone]   Fax:  [phone]  Physical Therapy Evaluation  Patient Details  Name: Raymond Barrera MRN: [id number] Date of Birth: 05/15/1961 Referring Provider: Lucia Gaskins   Encounter Date: 02/17/2017      PT End of Session - 02/17/17 1613    Visit Number 1   Number of Visits 9   Date for PT Re-Evaluation 03/17/17   Authorization Type Medicaid (visits pending)   Authorization Time Period 02/17/17 to 03/20/17   PT Start Time 6948  ran over with speech evaluation/patient fatigued    PT Stop Time 1602   PT Time Calculation (min) 27 min   Activity Tolerance Patient tolerated treatment well   Behavior During Therapy [facility] for tasks assessed/performed      Past Medical History:  Diagnosis Date  . Anxiety    takes Valium daily as needed  . Arthritis   . Back pain    hx of buldging disc  . Diabetes mellitus without complication ([place])    takes Metformin daily  . Headache    migraines  . High cholesterol    takes Zocor daily  . Hypertension    takes Benazepril and HCTZ  daily  . Joint pain   . Joint swelling   . Memory impairment    occassional - from stroke  . Myocardial infarction 1987  . Pneumonia    hx of-80's  . PONV (postoperative nausea and vomiting)    took a while for him to wake up after previous anesthesia  . Shortness of breath dyspnea    with exertion  . Slurred speech   . Stroke ([place]) 08/2013  . TIA (transient ischemic attack) 2014   x 7   . Urinary frequency     Past Surgical History:  Procedure Laterality Date  . ANKLE SURGERY  2008   left ankle-otif-[facility]  . BACK SURGERY    . JOINT REPLACEMENT     both hips replaced   . LUMBAR LAMINECTOMY/DECOMPRESSION MICRODISCECTOMY Right 10/17/2014   Procedure: LUMBAR LAMINECTOMY/DECOMPRESSION MICRODISCECTOMY 2 LEVELS;  Surgeon: [name], MD;  Location: [place] [facility];  Service: Neurosurgery;   Laterality: Right;  Right L45 L5S1 laminectomy and foraminotomy  . MASS EXCISION  09/13/2012   Procedure: EXCISION MASS;  Surgeon: Jamesetta So, MD;  Location: [facility];  Service: General;  Laterality: N/A;  . TONSILLECTOMY    . TOTAL HIP ARTHROPLASTY Right 03/20/2015  . TOTAL HIP ARTHROPLASTY Right 03/20/2015   Procedure: TOTAL HIP ARTHROPLASTY ANTERIOR APPROACH;  Surgeon: Renette Butters, MD;  Location: [place];  Service: Orthopedics;  Laterality: Right;  . TOTAL HIP ARTHROPLASTY Left 01/08/2016   Procedure: TOTAL HIP ARTHROPLASTY ANTERIOR APPROACH;  Surgeon: Renette Butters, MD;  Location: [place];  Service: Orthopedics;  Laterality: Left;    There were no vitals filed for this visit.       Subjective Assessment - 02/17/17 1611    Subjective Patient arrives with his wife and states that he started to have CVA symptoms on March 2nd but it did not become very clear it was a CVA until March 3rd, they went to the hospital on March 4th. He went to [facility] and received PT in the acute setting.  He has not had HHPT, was basically told to rest and relax to prepare for OP PT. They report that keeping balance is difficult especially when he is trying to get dressed in  a hurry, he also gets tired with walking but is walking mostly with walker. He is still a bit unstable. He has had some close calls and has almost walked into the wall/door/etc. It is still hard getting up from low surfaces and chairs. He does not use his walker or cane that much at home but is fairly wobbly per wife.    Currently in Pain? Yes   Pain Score 8    Pain Location Hip   Pain Orientation Right;Left   Pain Descriptors / Indicators Aching;Constant   Pain Type Chronic pain   Pain Radiating Towards down the side of both legs    Pain Onset More than a month ago   Pain Frequency Constant   Aggravating Factors  everything/unsure    Pain Relieving Factors medicine/rest    Effect of Pain on Daily Activities severe impact              [facility] PT Assessment - 02/17/17 1542      Assessment   Medical Diagnosis acute CVA    Referring Provider Lucia Gaskins    Onset Date/Surgical Date --  March 2nd 2018   Next MD Visit Dr. Cindie Laroche tomorrow    Prior Therapy acute care PT      Precautions   Precaution Comments B anterior  hip replacements per chart review (2016 and 2017), acute CVA- fall risk      Balance Screen   Has the patient fallen in the past 6 months Yes   How many times? 1   Has the patient had a decrease in activity level because of a fear of falling?  Yes   Is the patient reluctant to leave their home because of a fear of falling?  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     Strength   Right Hip Flexion 4/5   Right Hip Extension 3/5  based off functional bridge    Right Hip ABduction 4+/5   Left Hip Flexion 3+/5   Left Hip Extension --  based off functional bridge    Left Hip ABduction 3-/5   Right Knee Flexion 4+/5   Right Knee Extension 5/5   Left Knee Flexion 4+/5   Left Knee Extension 5/5   Right Ankle Dorsiflexion 5/5   Left Ankle Dorsiflexion 5/5     6 minute walk test results    Aerobic Endurance Distance Walked 339   Endurance additional comments 3MWT, walker      High Level Balance   High Level Balance Comments TUG 20.5, walker                            PT Education - 02/17/17 1612    Education provided Yes   Education Details POC, general prognosis post-CVA; walk as much as possible and use home seated peddler for exercise, HEP to be given next session due to time limitations today    Person(s) Educated Patient;Spouse   Methods Explanation   Comprehension Verbalized understanding          PT Short Term Goals - 02/17/17 1622      PT SHORT TERM GOAL #1    Title Patient and spouse to be able to identify 5/5 safety factors in order to reduce fall risk and improve  general safety    Time 2   Period Weeks   Status New     PT SHORT TERM GOAL #2   Title Patient to be able to ambulate at least 555ft with LRAD during 3MWT in order to show improved mobility and home/community access    Time 2   Period Weeks   Status New     PT SHORT TERM GOAL #3   Title Patient to be able to complete TUG with LRAD in 15 seconds or less in order to show increased mobilty and reduced fall risk    Time 2   Period Weeks   Status New     PT SHORT TERM GOAL #4   Title Patient to be independent in correctly and consistently performing targeted HEP, to be updated as appropriate    Time 1   Period Weeks   Status New           PT Long Term Goals - 02/17/17 1623      PT LONG TERM GOAL #1   Title Patient to demonstrate functional strength at least 4/5 in order to improve gait and overall mobility    Time 4   Period Weeks   Status New     PT LONG TERM GOAL #2   Title Patient to be able to ambulate unlimited distances, untimed, with LRAD in order to show improved mobility and functional activity tolerance    Time 4   Period Weeks   Status New     PT LONG TERM GOAL #3   Title Patient to be able to independently dress and perform self care based tasks in unsupported standing with no LOB or concern for fall in order to improve general function and QOL    Time 4   Period Weeks   Status New     PT LONG TERM GOAL #4   Title Patient to be participatory in regular exercise program and educated regarding the benefit of smoking cessation in order to assist in general prevention of another TIA or CVA based incident    Time 4   Period Weeks   Status New               Plan - 02/17/17 1615    Clinical Impression Statement Patient arrives approximately 2.5 weeks after having experienced acute CVA; he was basically told to rest up for OP PT after discharge  from the hospital and as such did not receive HHPT services. He and his wife report he is doing well but one of their biggest concerns is his balance as he is still quite "wobbly". They also report that he is fatigued today after having had skilled OT and speech evaluations prior to PT today. Examination reveals gait impairment, general unsteadiness and reduced gait speed, extensive functional muscle weakness, and increased general fall risk. Patient also appears limited by ongoing severe bilateral hip and lumbar pain at this time. Recommend skilled PT services in order to address functional deficits, reduce fall risk, and assist in return to optimal level of function.     Rehab Potential Fair   Clinical Impairments Affecting Rehab Potential (+) very supportive spouse, somewhat success with PT in the past; (-) history of cocaine abuse, multiple TIAs, severe hip and lumbar pain, possible insurance limitations    PT Frequency 2x / week   PT Duration 4 weeks   PT Treatment/Interventions ADLs/Self Care Home Management;DME Instruction;Gait training;Stair training;Functional mobility training;Therapeutic activities;Therapeutic exercise;Balance training;Neuromuscular re-education;Patient/family education;Manual techniques;Energy conservation  PT Next Visit Plan assign formal HEP, review initial eval and goals; strong focus on sitting and standing balance exercises, core strength. Trial quadruped. Gait training, functional strength.    PT Home Exercise Plan assign second session    Consulted and Agree with Plan of Care Patient;Family member/caregiver   Family Member Consulted Spouse       Patient will benefit from skilled therapeutic intervention in order to improve the following deficits and impairments:  Abnormal gait, Pain, Decreased coordination, Decreased mobility, Decreased activity tolerance, Decreased strength, Decreased balance, Decreased safety awareness, Difficulty walking  Visit  Diagnosis: Difficulty in walking, not elsewhere classified - Plan: PT plan of care cert/re-cert  Unsteadiness on feet - Plan: PT plan of care cert/re-cert  Other abnormalities of gait and mobility - Plan: PT plan of care cert/re-cert  Muscle weakness (generalized) - Plan: PT plan of care cert/re-cert     Problem List Patient Active Problem List   Diagnosis Date Noted  . Acute CVA (cerebrovascular accident) ([place]) 02/01/2017  . Diabetes mellitus ([place]) 02/01/2017  . HTN (hypertension) 02/01/2017  . Primary osteoarthritis of left hip 01/08/2016  . DJD (degenerative joint disease) 03/20/2015  . Primary osteoarthritis of right hip 02/26/2015  . Lumbar spondylosis 10/17/2014  . Lumbago 07/25/2014  . Abnormality of gait 07/25/2014  . Difficulty in walking(719.7) 07/25/2014  . TIA (transient ischemic attack) 03/09/2013  . Cocaine abuse 03/09/2013  . Accelerated hypertension 03/09/2013  . Hyperlipidemia 03/09/2013  . Current smoker 03/09/2013    Deniece Ree PT, DPT [place] [address] [place], [place], [address] Phone: [phone]   Fax:  [phone]  Name: Raymond Barrera MRN: [id number] Date of Birth: December 17, 1960

## 2017-02-17 NOTE — Therapy (Signed)
Raymond Barrera, [place], [address] Phone: [phone]   Fax:  [phone]  Occupational Therapy Evaluation  Patient Details  Name: Raymond Barrera MRN: [id number] Date of Birth: 1961-01-05 Referring Provider: Dr. Cindie Laroche  Encounter Date: 02/17/2017      OT End of Session - 02/17/17 2102    Visit Number 1   Number of Visits 12   Date for OT Re-Evaluation 03/31/17   Authorization Type Medicaid   Authorization Time Period requesting 8 OT visits from 02/17/17-03/20/17   Authorization - Visit Number 0   Authorization - Number of Visits 8   OT Start Time 2947   OT Stop Time 1430   OT Time Calculation (min) 45 min   Activity Tolerance Patient tolerated treatment well   Behavior During Therapy [facility] for tasks assessed/performed      Past Medical History:  Diagnosis Date  . Anxiety    takes Valium daily as needed  . Arthritis   . Back pain    hx of buldging disc  . Diabetes mellitus without complication ([place])    takes Metformin daily  . Headache    migraines  . High cholesterol    takes Zocor daily  . Hypertension    takes Benazepril and HCTZ  daily  . Joint pain   . Joint swelling   . Memory impairment    occassional - from stroke  . Myocardial infarction 1987  . Pneumonia    hx of-80's  . PONV (postoperative nausea and vomiting)    took a while for him to wake up after previous anesthesia  . Shortness of breath dyspnea    with exertion  . Slurred speech   . Stroke ([place]) 08/2013  . TIA (transient ischemic attack) 2014   x 7   . Urinary frequency     Past Surgical History:  Procedure Laterality Date  . ANKLE SURGERY  2008   left ankle-otif-[facility]  . BACK SURGERY    . JOINT REPLACEMENT     both hips replaced   . LUMBAR LAMINECTOMY/DECOMPRESSION MICRODISCECTOMY Right 10/17/2014   Procedure: LUMBAR LAMINECTOMY/DECOMPRESSION MICRODISCECTOMY 2 LEVELS;  Surgeon: [name], MD;  Location: [place] [facility];  Service: Neurosurgery;  Laterality: Right;  Right L45 L5S1 laminectomy and foraminotomy  . MASS EXCISION  09/13/2012   Procedure: EXCISION MASS;  Surgeon: Jamesetta So, MD;  [facility];  Service: General;  Laterality: N/A;  . TONSILLECTOMY    . TOTAL HIP ARTHROPLASTY Right 03/20/2015  . TOTAL HIP ARTHROPLASTY Right 03/20/2015   Procedure: TOTAL HIP ARTHROPLASTY ANTERIOR APPROACH;  Surgeon: Renette Butters, MD;  Location: [place];  Service: Orthopedics;  Laterality: Right;  . TOTAL HIP ARTHROPLASTY Left 01/08/2016   Procedure: TOTAL HIP ARTHROPLASTY ANTERIOR APPROACH;  Surgeon: Renette Butters, MD;  Location: [place];  Service: Orthopedics;  Laterality: Left;    There were no vitals filed for this visit.      Subjective Assessment - 02/17/17 2057    Subjective  S:  I want to enjoy life again.    Patient is accompained by: Family member   Pertinent History Raymond Barrera was hospitalized from 02/01/17-02/05/17 due to acute CVA with ridgt side weakness.  He was discharged home on 02/05/17.  He presents to outpatient therapy for evaluation and treatment.  Also referred to PT and ST.  Patient is ambulating with a walker at this time.     Special Tests n/a  Patient Stated Goals I want to get my writing back to normal.     Currently in Pain? Yes   Pain Score 8    Pain Location Hip   Pain Orientation Right;Left   Pain Descriptors / Indicators Aching;Constant   Pain Type Chronic pain   Pain Radiating Towards down the side of both legs   Pain Onset More than a month ago   Pain Frequency Constant   Aggravating Factors  everything   Pain Relieving Factors nothing   Effect of Pain on Daily Activities severe impact   Multiple Pain Sites No           [facility] OT Assessment - 02/17/17 2118      Assessment   Diagnosis Right Arm Weakness and Lack of Coordination S/P L CVA   Referring Provider Dr. Cindie Laroche   Onset Date 02/01/17   Prior Therapy acute care PT, OT, ST     Precautions    Precautions Fall     Restrictions   Weight Bearing Restrictions No     Balance Screen   Has the patient fallen in the past 6 months Yes   How many times? 1   Has the patient had a decrease in activity level because of a fear of falling?  No   Is the patient reluctant to leave their home because of a fear of falling?  No     Home  Environment   Family/patient expects to be discharged to: Private residence   Living Arrangements Spouse/significant other   Lives With [place] with basic ADLs  driving   [place] Unemployed   [place], working on car, walking with his wife, spending time with grandkids     ADL   ADL comments Patient is unable to use his dominant right hand as dominant due to decreased gross and fine motor coordination.  He has not returned to driving.  He needs assist with tub transfers     Mobility   Mobility Status Comments ambulating with walker      Written Expression   Dominant Hand Right   Handwriting 75% legible     Vision - History   Baseline Vision No visual deficits     Cognition   Overall Cognitive Status Within Functional Limits for tasks assessed  refer to SLP evaluation for further details     Observation/Other Assessments   Focus on Therapeutic Outcomes (FOTO)  clinical observation     Sensation   Light Touch Appears Intact   Stereognosis Appears Intact     Coordination   Gross Motor Movements are Fluid and Coordinated No   Fine Motor Movements are Fluid and Coordinated No   Finger Nose Finger Test [facility]   Heel Shin Test assessed rapid bilateral supination/pronation and right arm is not able to keep up with left   9 Hole Peg Test Right;Left   Right 9 Hole Peg Test 31.56"   Left 9 Hole Peg Test 23.42"   Box and Blocks assess at next visit     ROM / Strength   AROM / PROM / Strength AROM;Strength     AROM   Overall AROM Comments RUE AROM is WNL      Strength   Overall Strength Comments RUE strength is 4+/5     Hand Function   Right Hand Grip (lbs) 125   Right Hand Lateral Pinch 30 lbs  Right Hand 3 Point Pinch 24 lbs   Left Hand Grip (lbs) 125   Left Hand Lateral Pinch 28 lbs   Left 3 point pinch 20 lbs                         OT Education - 02/17/17 2100    Education Details Educated patient on HEP for [facility] retraining   Person(s) Educated Patient;Spouse   Methods Explanation;Demonstration;Handout   Comprehension Verbalized understanding;Returned demonstration          OT Short Term Goals - 02/17/17 2109      OT SHORT TERM GOAL #1   Title Patient will be educated on a HEP for improved [place] in right arm needed for return to PLOF.   Time 3   Period Weeks   Status New     OT SHORT TERM GOAL #2   Title Patient will improve GMC in RUE as demonstrated by a 50% improvement in rapid supination/pronation task.   Time 3   Period Weeks   Status New     OT SHORT TERM GOAL #3   Title Patient will improve fine motor coordination in his right hand for improved independence with manipulating tools when working on his car by improving completion speed of nine hole peg test by 4 seconds.     Time 3   Period Weeks   Status New     OT SHORT TERM GOAL #4   Title Patient will be educated on safe tub transfer using tub transfer bench.   Time 3   Period Weeks   Status New     OT SHORT TERM GOAL #5   Title Patient will improve handwriting fluidity to 85%.     Time 3   Period Weeks   Status New           OT Long Term Goals - 02/17/17 2112      OT LONG TERM GOAL #1   Title Patient will return to prior level of function with all B/IADLs and leisure activities. 6   Time 6   Period Weeks   Status New     OT LONG TERM GOAL #2   Title Patient will improve GMC in right arm to 90% for improved safety with ADL completion.   Time 6   Period Weeks   Status New     OT LONG TERM GOAL #3   Title  Patient will improve right hand fine motor coordination for improved accuracy with home maitenance tasks by increasing speed on nine hole peg test to 25" or better.    Time 6   Period Weeks   Status New     OT LONG TERM GOAL #4   Title Patient will improve handwriting to 90% fluidity.     Time 6   Period Weeks   Status New               Plan - 02/17/17 2103    Clinical Impression Statement A:  Patient is a 56 year old male with past medical history significant for ankle fracture, back surgery, depression, bilateral hip replacements, excison of neck lipoma, and acute ischemic CVA with right side weakness on 02/01/17.  Patient is unemployed and seeking disablity.  He has decreased independence with his BADLs and IADLs and leisure tasks s/p CVA.     Rehab Potential Good   OT Frequency 2x / week   OT Duration 6 weeks  OT Treatment/Interventions Self-care/ADL training;Electrical Stimulation;Moist Heat;Ultrasound;Contrast Bath;Therapeutic exercise;Neuromuscular education;Energy conservation;DME and/or AE instruction;Manual Therapy;Therapeutic exercises;Therapeutic activities;Patient/family education   Plan P:  Skilled OT intervention to improve gross motor coordination, fine motor coordination in RUE needed for improved safety and independence with B/IADLs and leisure actiivties.  Next session:  review plan of care, begin [facility] training, educate patient on tub transfer utilizing tub transfer bench.     OT Home Exercise Plan 02/17/17:  fine motor coordination training   Consulted and Agree with Plan of Care Patient;Family member/caregiver   Family Member Consulted wife      Patient will benefit from skilled therapeutic intervention in order to improve the following deficits and impairments:  Decreased coordination  Visit Diagnosis: Other lack of coordination - Plan: Ot plan of care cert/re-cert    Problem List Patient Active Problem List   Diagnosis Date Noted  . Acute CVA  (cerebrovascular accident) ([place]) 02/01/2017  . Diabetes mellitus ([place]) 02/01/2017  . HTN (hypertension) 02/01/2017  . Primary osteoarthritis of left hip 01/08/2016  . DJD (degenerative joint disease) 03/20/2015  . Primary osteoarthritis of right hip 02/26/2015  . Lumbar spondylosis 10/17/2014  . Lumbago 07/25/2014  . Abnormality of gait 07/25/2014  . Difficulty in walking(719.7) 07/25/2014  . TIA (transient ischemic attack) 03/09/2013  . Cocaine abuse 03/09/2013  . Accelerated hypertension 03/09/2013  . Hyperlipidemia 03/09/2013  . Current smoker 03/09/2013    Vangie Bicker, [place], OTR/L [phone]  02/17/2017, 9:28 PM  [place]. [facility] [address] [place], [place], [address] Phone: [phone]   Fax:  [phone]  Name: Raymond Barrera MRN: [id number] Date of Birth: [birth year]

## 2017-02-17 NOTE — Therapy (Signed)
[place], [place], [address] Phone: [phone]   Fax:  [phone]  Speech Language Pathology Evaluation  Patient Details  Name: Raymond Barrera MRN: [id number] Date of Birth: Feb 26, 1961 Referring Provider: Lucia Gaskins, MD  Encounter Date: 02/17/2017      End of Session - 02/17/17 1551    Visit Number 1   Number of Visits 8   Authorization Type Medicaid   SLP Start Time 1430   SLP Stop Time  5009   SLP Time Calculation (min) 53 min   Activity Tolerance Patient tolerated treatment well      Past Medical History:  Diagnosis Date  . Anxiety    takes Valium daily as needed  . Arthritis   . Back pain    hx of buldging disc  . Diabetes mellitus without complication ([place])    takes Metformin daily  . Headache    migraines  . High cholesterol    takes Zocor daily  . Hypertension    takes Benazepril and HCTZ  daily  . Joint pain   . Joint swelling   . Memory impairment    occassional - from stroke  . Myocardial infarction 1987  . Pneumonia    hx of-80's  . PONV (postoperative nausea and vomiting)    took a while for him to wake up after previous anesthesia  . Shortness of breath dyspnea    with exertion  . Slurred speech   . Stroke ([place]) 08/2013  . TIA (transient ischemic attack) 2014   x 7   . Urinary frequency     Past Surgical History:  Procedure Laterality Date  . ANKLE SURGERY  2008   left ankle-otif-[facility]  . BACK SURGERY    . JOINT REPLACEMENT     both hips replaced   . LUMBAR LAMINECTOMY/DECOMPRESSION MICRODISCECTOMY Right 10/17/2014   Procedure: LUMBAR LAMINECTOMY/DECOMPRESSION MICRODISCECTOMY 2 LEVELS;  Surgeon: [name], MD;  Location: [facility];  Service: Neurosurgery;  Laterality: Right;  Right L45 L5S1 laminectomy and foraminotomy  . MASS EXCISION  09/13/2012   Procedure: EXCISION MASS;  Surgeon: Jamesetta So, MD;  Location: [facility];  Service: General;   Laterality: N/A;  . TONSILLECTOMY    . TOTAL HIP ARTHROPLASTY Right 03/20/2015  . TOTAL HIP ARTHROPLASTY Right 03/20/2015   Procedure: TOTAL HIP ARTHROPLASTY ANTERIOR APPROACH;  Surgeon: Renette Butters, MD;  Location: [facility];  Service: Orthopedics;  Laterality: Right;  . TOTAL HIP ARTHROPLASTY Left 01/08/2016   Procedure: TOTAL HIP ARTHROPLASTY ANTERIOR APPROACH;  Surgeon: Renette Butters, MD;  Location: [facility];  Service: Orthopedics;  Laterality: Left;    There were no vitals filed for this visit.      Subjective Assessment - 02/17/17 1534    Subjective "I want people to be able to understand me!"   Patient is accompained by: Family member  wife   Special Tests MoCA Blind   Currently in Pain? No/denies            SLP Evaluation [facility] - 02/17/17 1534      SLP Visit Information   SLP Received On 02/17/17   Referring Provider Lucia Gaskins, MD     Subjective   Subjective I want people to understand what I'm saying.   Patient/Family Stated Goal To be understood on the phone.     General Information   HPI Raymond Barrera  is a 56 y.o. male. Patient was brought by his wife  for complaints of right-sided weakness with symptoms starting Friday evening. Saturday late afternoon he developed some dysarthria with right facial droop. When he woke up on Sunday he started to notice right-sided weakness prompting him to come to ED. In ED workup significant for subacute CVA with no evidence of hemorrhage. MRI showed acute infarct in the left putamen and internal capsule posteriorly.     Prior Functional Status   Cognitive/Linguistic Baseline Within functional limits   Type of Home Apartment    Lives With Spouse   Available Support Family;Friend(s)   Education 11th grade   Vocation Retired     Associate Professor   Overall Cognitive Status Impaired/Different from baseline   Area of Impairment Memory;Attention   Memory Decreased short-term memory   Attention Selective   Memory Impaired   Memory  Impairment Storage deficit;Retrieval deficit;Decreased recall of new information;Decreased short term memory   Decreased Short Term Memory Verbal basic   Awareness Appears intact   Problem Solving Appears intact   Executive Function Initiating;Self Monitoring   Initiating Impaired   Initiating Impairment Functional basic   Self Monitoring Impaired   Self Monitoring Impairment Functional basic     Auditory Comprehension   Overall Auditory Comprehension Appears within functional limits for tasks assessed   Yes/No Questions Not tested   Commands Within Functional Limits   Conversation Simple   EffectiveTechniques Extra processing time;Pausing;Repetition;Slowed speech     Visual Recognition/Discrimination   Discrimination Not tested     Reading Comprehension   Reading Status Not tested     Expression   Primary Mode of Expression Verbal     Verbal Expression   Overall Verbal Expression Impaired   Initiation No impairment   Automatic Speech Day of week   Level of Generative/Spontaneous Verbalization Conversation   Repetition Impaired   Level of Impairment Sentence level   Naming Impairment   Responsive Not tested   Confrontation 75-100% accurate   Divergent 75-100% accurate   Other Naming Comments Great success with divergent naming task   Pragmatics No impairment   Interfering Components Speech intelligibility;Anatomical limitation     Written Expression   Dominant Hand Right   Written Expression Not tested     Oral Motor/Sensory Function   Overall Oral Motor/Sensory Function Appears within functional limits for tasks assessed     Motor Speech   Overall Motor Speech Impaired   Respiration Within functional limits   Phonation Low vocal intensity   Resonance Hypernasality   Articulation Impaired   Level of Impairment Word   Intelligibility Intelligibility reduced   Word 50-74% accurate   Phrase 50-74% accurate   Sentence 25-49% accurate   Conversation 25-49%  accurate   Motor Planning Witnin functional limits     Standardized Assessments   Standardized Assessments  Montreal Cognitive Assessment (MOCA)  MoCA Blind (for writing limitation)    Montreal Cognitive Assessment (MOCA)  12/22 was Pt score with "normal" score being 18/22. One point was added because Pt did not have a 12 year education level.     SLP - End of Session   Patient left with family/visitor present     SLP Recommendations   Follow up Recommendations Outpatient SLP           SLP Education - 02/17/17 1550    Education provided Yes   Education Details Pt provided "talking tips" including speak up, slow down, and open mouth (over articulate) to compensate for deficits   Person(s) Educated Patient;Spouse   Methods Explanation;Handout  Comprehension Verbalized understanding          SLP Short Term Goals - 02/17/17 1720      SLP SHORT TERM GOAL #1   Title Pt will increase conversation level speech intelligibility to 80% by utilizing appropriate strategies and listener needing repetition no more than 3x in a given conversation.   Baseline Clarification needed up to 5 times in a given conversation of 60% intelligible across 5 min.    Time 4   Period Weeks   Status New     SLP SHORT TERM GOAL #2   Title Pt will complete functional memory activities (mod level) with 90% accuracy when given min/mod cues and use of memory strategies.   Baseline 3/5 recall of words without cues   Time 4   Period Weeks   Status New     SLP SHORT TERM GOAL #3   Title Pt will complete designated oral motor tasks 3x daily with 10 repetitions each after introduced by SLP with use of written cue and given a daily log.    Baseline Pt reports continued practice of pursed lips and smile after being given exercises in the hospital.    Time 4   Period Weeks   Status New     SLP SHORT TERM GOAL #4   Title Pt will increase speech intelligibility to 90% accuracy when utilizing multisyllabic  words in sentences given min/mod cues from clinician.   Baseline 75% intelligible given sentences to repeat   Time 4   Period Weeks   Status New          SLP Long Term Goals - 02/17/17 1753      SLP LONG TERM GOAL #1   Title same as short term goals          Plan - 02/17/17 1555    Clinical Impression Statement Pt seen for cognitive linguistic evaluation. Pt engaed and cooperative durring evaluation. Pt reports no swallowing impairment and improved speech since discharge from the hospital but continues to be unintelligible and has to repeat what he says frequently. Clinician administered the MoCA blind (used due to right hand weakness to compensate for frustartion when writing). Pt exhibited impairments in immediate recall and short term memory. Unable to recall words initially stated and Pt exhibited emotional lability initiating laughter with difficulty stopping during this task. Wife reports that the Pt at baseline is strong with mental math and attention tasks, but during assessment Pt was unable to complete simple subtraction of 100 subtract 7. Pt reports frustration with talking on the phone not being understood and interest in speaking clearly. Pt exhibits clear defricits in memory, attention, repetition, and language fluency. Recommend Pt receive skilled SLP services to target the previously stated deficits to decrease burden of care and increase independence.    Speech Therapy Frequency 2x / week   Duration 4 weeks   Treatment/Interventions Language facilitation;Compensatory techniques;Cueing hierarchy;Cognitive reorganization;Internal/external aids;Oral motor exercises;Functional tasks;Multimodal communcation approach;SLP instruction and feedback;Compensatory strategies;Patient/family education   Potential to Achieve Goals Good   SLP Home Exercise Plan Continued oral motor stretches and visual reminders for compensatory strategies   Consulted and Agree with Plan of Care  Patient;Family member/caregiver      Patient will benefit from skilled therapeutic intervention in order to improve the following deficits and impairments:   Cognitive communication deficit  Dysarthria following other cerebrovascular disease    Problem List Patient Active Problem List   Diagnosis Date Noted  . Acute CVA (cerebrovascular accident) (  [place]) 02/01/2017  . Diabetes mellitus ([place]) 02/01/2017  . HTN (hypertension) 02/01/2017  . Primary osteoarthritis of left hip 01/08/2016  . DJD (degenerative joint disease) 03/20/2015  . Primary osteoarthritis of right hip 02/26/2015  . Lumbar spondylosis 10/17/2014  . Lumbago 07/25/2014  . Abnormality of gait 07/25/2014  . Difficulty in walking(719.7) 07/25/2014  . TIA (transient ischemic attack) 03/09/2013  . Cocaine abuse 03/09/2013  . Accelerated hypertension 03/09/2013  . Hyperlipidemia 03/09/2013  . Current smoker 03/09/2013   Thank you,  Eda Keys, SLP Graduate Clinician   [facility] 02/17/2017, 5:54 PM  [place], [place], [address] Phone: [phone]   Fax:  [phone]  Name: Raymond Barrera MRN: [id number] Date of Birth: 10/25/1961

## 2017-02-24 ENCOUNTER — Emergency Department (HOSPITAL_COMMUNITY)
Admission: EM | Admit: 2017-02-24 | Discharge: 2017-02-25 | Disposition: A | Discharge status: Home / Self Care | Payer: Medicaid Other | Attending: Emergency Medicine | Admitting: Emergency Medicine

## 2017-02-24 ENCOUNTER — Emergency Department (HOSPITAL_COMMUNITY): Payer: Medicaid Other

## 2017-02-24 DIAGNOSIS — Z7984 Long term (current) use of oral hypoglycemic drugs: Secondary | ICD-10-CM | POA: Insufficient documentation

## 2017-02-24 DIAGNOSIS — Z79899 Other long term (current) drug therapy: Secondary | ICD-10-CM | POA: Insufficient documentation

## 2017-02-24 DIAGNOSIS — R0602 Shortness of breath: Secondary | ICD-10-CM | POA: Diagnosis present

## 2017-02-24 DIAGNOSIS — R05 Cough: Secondary | ICD-10-CM | POA: Insufficient documentation

## 2017-02-24 DIAGNOSIS — Z7982 Long term (current) use of aspirin: Secondary | ICD-10-CM | POA: Insufficient documentation

## 2017-02-24 DIAGNOSIS — I252 Old myocardial infarction: Secondary | ICD-10-CM | POA: Diagnosis not present

## 2017-02-24 DIAGNOSIS — E119 Type 2 diabetes mellitus without complications: Secondary | ICD-10-CM | POA: Insufficient documentation

## 2017-02-24 DIAGNOSIS — I10 Essential (primary) hypertension: Secondary | ICD-10-CM | POA: Diagnosis not present

## 2017-02-24 DIAGNOSIS — F17210 Nicotine dependence, cigarettes, uncomplicated: Secondary | ICD-10-CM | POA: Diagnosis not present

## 2017-02-24 LAB — CBC WITH DIFFERENTIAL/PLATELET
BASOS ABS: 0 10*3/uL (ref 0.0–0.1)
Basophils Relative: 0 %
EOS PCT: 2 %
Eosinophils Absolute: 0.1 10*3/uL (ref 0.0–0.7)
HEMATOCRIT: 41.5 % (ref 39.0–52.0)
HEMOGLOBIN: 14.2 g/dL (ref 13.0–17.0)
Lymphocytes Relative: 20 %
Lymphs Abs: 1.7 10*3/uL (ref 0.7–4.0)
MCH: 29.9 pg (ref 26.0–34.0)
MCHC: 34.2 g/dL (ref 30.0–36.0)
MCV: 87.4 fL (ref 78.0–100.0)
Monocytes Absolute: 0.4 10*3/uL (ref 0.1–1.0)
Monocytes Relative: 5 %
NEUTROS ABS: 6.2 10*3/uL (ref 1.7–7.7)
NEUTROS PCT: 73 %
PLATELETS: 253 10*3/uL (ref 150–400)
RBC: 4.75 MIL/uL (ref 4.22–5.81)
RDW: 13.8 % (ref 11.5–15.5)
WBC: 8.4 10*3/uL (ref 4.0–10.5)

## 2017-02-24 NOTE — ED Triage Notes (Signed)
Pt reports feeling sob since 8 pm tonight. Pt denies any other symptoms. Pt recently had a stroke on March 4th.

## 2017-02-24 NOTE — ED Provider Notes (Signed)
[facility] DEPT Provider Note   CSN: [id number] Arrival date & time: 02/24/17  2215  By signing my name below, I, Raymond Barrera, attest that this documentation has been prepared under the direction and in the presence of Raymond Greek, MD. Electronically Signed: Jeanell Barrera, Scribe. 02/24/2017. 11:15 PM.  History   Chief Complaint Chief Complaint  Patient presents with  . Shortness of Breath   The history is provided by the patient. No language interpreter was used.   HPI Comments: Raymond Barrera is a 56 y.o. male with a PMHx of Stroke and TIA who presents to the Emergency Department complaining of intermittent moderate SOB that started about 4 hours ago. He was admitted on 02/01/17 for a stroke and discharged on 02/06/17. He has been doing physical, occupational, and speech therapy since then. He had a fall onto his right side, hip, and leg, but otherwise no health problems. He reports associated productive cough (from suspected URI). He denies any difficulty swallowing or other complaints.     PCP: Maricela Curet, MD  Past Medical History:  Diagnosis Date  . Anxiety    takes Valium daily as needed  . Arthritis   . Back pain    hx of buldging disc  . Diabetes mellitus without complication ([place])    takes Metformin daily  . Headache    migraines  . High cholesterol    takes Zocor daily  . Hypertension    takes Benazepril and HCTZ  daily  . Joint pain   . Joint swelling   . Memory impairment    occassional - from stroke  . Myocardial infarction 1987  . Pneumonia    hx of-80's  . PONV (postoperative nausea and vomiting)    took a while for him to wake up after previous anesthesia  . Shortness of breath dyspnea    with exertion  . Slurred speech   . Stroke ([place]) 08/2013  . TIA (transient ischemic attack) 2014   x 7   . Urinary frequency     Patient Active Problem List   Diagnosis Date Noted  . Acute CVA (cerebrovascular accident) ([place]) 02/01/2017  .  Diabetes mellitus ([place]) 02/01/2017  . HTN (hypertension) 02/01/2017  . Primary osteoarthritis of left hip 01/08/2016  . DJD (degenerative joint disease) 03/20/2015  . Primary osteoarthritis of right hip 02/26/2015  . Lumbar spondylosis 10/17/2014  . Lumbago 07/25/2014  . Abnormality of gait 07/25/2014  . Difficulty in walking(719.7) 07/25/2014  . TIA (transient ischemic attack) 03/09/2013  . Cocaine abuse 03/09/2013  . Accelerated hypertension 03/09/2013  . Hyperlipidemia 03/09/2013  . Current smoker 03/09/2013    Past Surgical History:  Procedure Laterality Date  . ANKLE SURGERY  2008   left ankle-otif-[facility]  . BACK SURGERY    . JOINT REPLACEMENT     both hips replaced   . LUMBAR LAMINECTOMY/DECOMPRESSION MICRODISCECTOMY Right 10/17/2014   Procedure: LUMBAR LAMINECTOMY/DECOMPRESSION MICRODISCECTOMY 2 LEVELS;  Surgeon: [name], MD;  Location: [facility];  Service: Neurosurgery;  Laterality: Right;  Right L45 L5S1 laminectomy and foraminotomy  . MASS EXCISION  09/13/2012   Procedure: EXCISION MASS;  Surgeon: Jamesetta So, MD;  Location: [facility];  Service: General;  Laterality: N/A;  . TONSILLECTOMY    . TOTAL HIP ARTHROPLASTY Right 03/20/2015  . TOTAL HIP ARTHROPLASTY Right 03/20/2015   Procedure: TOTAL HIP ARTHROPLASTY ANTERIOR APPROACH;  Surgeon: Renette Butters, MD;  Location: [facility];  Service: Orthopedics;  Laterality: Right;  . TOTAL  HIP ARTHROPLASTY Left 01/08/2016   Procedure: TOTAL HIP ARTHROPLASTY ANTERIOR APPROACH;  Surgeon: Renette Butters, MD;  Location: [facility];  Service: Orthopedics;  Laterality: Left;       Home Medications    Prior to Admission medications   Medication Sig Start Date End Date Taking? Authorizing Provider  amLODipine (NORVASC) 10 MG tablet Take 10 mg by mouth daily.    Historical Provider, MD  aspirin 325 MG tablet Take 1 tablet (325 mg total) by mouth daily. 02/06/17   Lucia Gaskins, MD  benazepril (LOTENSIN) 20 MG tablet Take 20 mg  by mouth daily.    Historical Provider, MD  labetalol (NORMODYNE) 200 MG tablet Take 200 mg by mouth 2 (two) times daily.    Historical Provider, MD  levofloxacin (LEVAQUIN) 750 MG tablet Take 1 tablet (750 mg total) by mouth daily. 02/25/17   Raymond Greek, MD  LORazepam (ATIVAN) 1 MG tablet Take 1 mg by mouth every evening.    Historical Provider, MD  metFORMIN (GLUCOPHAGE) 500 MG tablet Take 500 mg by mouth 2 (two) times daily with a meal.    Historical Provider, MD  oxyCODONE (ROXICODONE) 15 MG immediate release tablet Take 15 mg by mouth every 6 (six) hours as needed for pain.    Historical Provider, MD  simvastatin (ZOCOR) 40 MG tablet Take 40 mg by mouth daily.    Historical Provider, MD    Family History No family history on file.  Social History Social History  Substance Use Topics  . Smoking status: Current Every Day Smoker    Packs/day: 0.50    Years: 47.00    Types: Cigarettes  . Smokeless tobacco: Never Used  . Alcohol use No     Comment: quit 2012     Allergies   Poison ivy extract [poison ivy extract]   Review of Systems Review of Systems  HENT: Negative for trouble swallowing.   Respiratory: Positive for cough and shortness of breath.   All other systems reviewed and are negative.    Physical Exam Updated Vital Signs BP (!) 133/96 (BP Location: Left Arm)   Pulse 73   Temp 97.8 F (36.6 C) (Oral)   Resp (!) 22   Ht 6\' 6"  (1.981 m)   Wt 266 lb (120.7 kg)   SpO2 99%   BMI 30.74 kg/m   Physical Exam  Constitutional: He is oriented to person, place, and time. He appears well-developed and well-nourished. No distress.  HENT:  Head: Normocephalic and atraumatic.  Right Ear: Hearing normal.  Left Ear: Hearing normal.  Nose: Nose normal.  Mouth/Throat: Oropharynx is clear and moist and mucous membranes are normal.  Eyes: Conjunctivae and EOM are normal. Pupils are equal, round, and reactive to light.  Neck: Normal range of motion. Neck supple.    Cardiovascular: Regular rhythm, S1 normal and S2 normal.  Exam reveals no gallop and no friction rub.   No murmur heard. Pulmonary/Chest: Effort normal and breath sounds normal. No respiratory distress. He exhibits no tenderness.  Abdominal: Soft. Normal appearance and bowel sounds are normal. There is no hepatosplenomegaly. There is no tenderness. There is no rebound, no guarding, no tenderness at McBurney's point and negative Murphy's sign. No hernia.  Musculoskeletal: Normal range of motion.  Neurological: He is alert and oriented to person, place, and time. He has normal strength. No cranial nerve deficit or sensory deficit. Coordination normal. GCS eye subscore is 4. GCS verbal subscore is 5. GCS motor subscore is 6.  Skin: Skin is warm, dry and intact. No rash noted. No cyanosis.  Psychiatric: He has a normal mood and affect. His speech is normal and behavior is normal. Thought content normal.  Nursing note and vitals reviewed.    ED Treatments / Results  DIAGNOSTIC STUDIES: Oxygen Saturation is 99% on RA, normal by my interpretation.    COORDINATION OF CARE: 11:20 PM- Pt advised of plan for treatment and pt agrees.  Labs (all labs ordered are listed, but only abnormal results are displayed) Labs Reviewed  BASIC METABOLIC PANEL - Abnormal; Notable for the following:       Result Value   Glucose, Bld 140 (*)    All other components within normal limits  CBC WITH DIFFERENTIAL/PLATELET  BRAIN NATRIURETIC PEPTIDE  I-STAT CG4 LACTIC ACID, ED  I-STAT TROPOININ, ED    EKG  EKG Interpretation  Date/Time:  Tuesday February 24 2017 22:47:12 EDT Ventricular Rate:  73 PR Interval:    QRS Duration: 111 QT Interval:  402 QTC Calculation: 443 R Axis:   24 Text Interpretation:  Sinus rhythm Left ventricular hypertrophy No significant change since last tracing Confirmed by Elisabella Hacker  MD, Lesleyann Fichter [phone]) on 02/24/2017 11:01:47 PM       Radiology Ct Angio Chest Pe W Or Wo  Contrast  Result Date: 02/25/2017 CLINICAL DATA:  56 y/o M; intermittent moderate shortness of breath. EXAM: CT ANGIOGRAPHY CHEST WITH CONTRAST TECHNIQUE: Multidetector CT imaging of the chest was performed using the standard protocol during bolus administration of intravenous contrast. Multiplanar CT image reconstructions and MIPs were obtained to evaluate the vascular anatomy. CONTRAST:  100 cc Isovue 370 COMPARISON:  02/24/2017 chest radiograph. FINDINGS: Cardiovascular: Satisfactory opacification of the pulmonary arteries to the segmental level. No evidence of pulmonary embolism. 4.2 cm ascending thoracic aorta. Normal caliber main pulmonary artery. Normal heart size. No pericardial effusion. Mediastinum/Nodes: No enlarged mediastinal, hilar, or axillary lymph nodes. Thyroid gland, trachea, and esophagus demonstrate no significant findings. Lungs/Pleura: Elevated left hemidiaphragm and minor atelectasis within the left lower lobe. No consolidation, pneumothorax, or effusion. Azygos fissure incidentally noted. Upper Abdomen: No acute abnormality. Musculoskeletal: Right anterior trapezius muscle fat attenuating mass measuring 6.2 x 5.0 x 8.2 cm (AP x ML x CC series 5, image 50 and series 8, image 77) within internal septations and no discrete soft tissue component. No acute osseous abnormality. Review of the MIP images confirms the above findings. IMPRESSION: 1. No pulmonary embolus identified. 2. Ascending aortic aneurysm measuring up to 4.2 cm. Recommend annual imaging followup by CTA or MRA. This recommendation follows 2010 ACCF/AHA/AATS/ACR/ASA/SCA/SCAI/SIR/STS/SVM Guidelines for the Diagnosis and Management of Patients with Thoracic Aortic Disease. Circulation. 2010; 121: X412-I786 3. Elevated left hemidiaphragm and minor atelectasis in the left lower lobe. 4. 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 Electronically Signed   By: Kristine Garbe M.D.   On: 02/25/2017 02:23    Dg Chest Portable 1 View  Result Date: 02/24/2017 CLINICAL DATA:  Acute onset of shortness of breath and cough. Initial encounter. EXAM: PORTABLE CHEST 1 VIEW COMPARISON:  Chest radiograph performed 11/05/2016 FINDINGS: There is elevation of the left hemidiaphragm. Vascular congestion is noted. Left basilar scarring is again noted. Minimal airspace opacity is suggested at the right lung base. There is no evidence of pleural effusion or pneumothorax. The cardiomediastinal silhouette is mildly enlarged. No acute osseous abnormalities are seen. IMPRESSION: 1. Minimal airspace opacity suggested at the right lung base. This could reflect minimal pneumonia. 2. Left basilar scarring  again noted. Elevation of the left hemidiaphragm. 3. Vascular congestion noted. Electronically Signed   By: Garald Balding M.D.   On: 02/24/2017 23:08    Procedures Procedures (including critical care time)  Medications Ordered in ED Medications  iopamidol (ISOVUE-370) 76 % injection 100 mL (100 mLs Intravenous Contrast Given 02/25/17 0137)     Initial Impression / Assessment and Plan / ED Course  I have reviewed the triage vital signs and the nursing notes.  Pertinent labs & imaging results that were available during my care of the patient were reviewed by me and considered in my medical decision making (see chart for details).     Patient presents to the emergency department for evaluation of shortness of breath. He has had several episodes of shortness of breath over the course of today, worsened this evening. He has had associated cough. No fever. Patient did recently have a stroke. He passed a swallow studies, has not had any difficulty with swallowing after the stroke.  Patient's vital signs are normal other than slight hypertension. He is not hypoxic. He does not appear to be in any distress with his breathing currently. Chest x-ray suggested early pneumonia. Based on his recent hospitalization, CT angiography  was performed. No evidence of PE noted. CT does not show obvious pneumonia. Patient does not have any signs of congestive heart failure. Cardiac evaluation negative. Patient reassured, will empirically cover with Levaquin, as even though patient has passed a swallow studies, suspect he is at risk for possible aspiration. Follow-up with primary care doctor.  Final Clinical Impressions(s) / ED Diagnoses   Final diagnoses:  Shortness of breath    New Prescriptions New Prescriptions   LEVOFLOXACIN (LEVAQUIN) 750 MG TABLET    Take 1 tablet (750 mg total) by mouth daily.   I personally performed the services described in this documentation, which was scribed in my presence. The recorded information has been reviewed and is accurate.     Raymond Greek, MD 02/25/17 [phone]

## 2017-02-25 ENCOUNTER — Telehealth (HOSPITAL_COMMUNITY): Payer: Self-pay | Admitting: Family Medicine

## 2017-02-25 ENCOUNTER — Emergency Department (HOSPITAL_COMMUNITY): Payer: Medicaid Other

## 2017-02-25 ENCOUNTER — Ambulatory Visit (HOSPITAL_COMMUNITY): Payer: Medicaid Other | Admitting: Physical Therapy

## 2017-02-25 LAB — BASIC METABOLIC PANEL
ANION GAP: 7 (ref 5–15)
BUN: 18 mg/dL (ref 6–20)
CALCIUM: 9.9 mg/dL (ref 8.9–10.3)
CO2: 28 mmol/L (ref 22–32)
Chloride: 105 mmol/L (ref 101–111)
Creatinine, Ser: 1.13 mg/dL (ref 0.61–1.24)
GFR calc Af Amer: >60 mL/min (ref >60)
GFR calc non Af Amer: >60 mL/min (ref >60)
GLUCOSE: 140 mg/dL — AB (ref 65–99)
Potassium: 4.2 mmol/L (ref 3.5–5.1)
Sodium: 140 mmol/L (ref 135–145)

## 2017-02-25 LAB — BRAIN NATRIURETIC PEPTIDE: B NATRIURETIC PEPTIDE 5: 74 pg/mL (ref 0.0–100.0)

## 2017-02-25 LAB — I-STAT CG4 LACTIC ACID, ED: LACTIC ACID, VENOUS: 0.96 mmol/L (ref 0.5–1.9)

## 2017-02-25 LAB — I-STAT TROPONIN, ED: TROPONIN I, POC: 0 ng/mL (ref 0.00–0.08)

## 2017-02-25 MED ORDER — LEVOFLOXACIN 750 MG PO TABS: 750.0000 mg | ORAL_TABLET | Freq: Every day | ORAL | 0 refills | Status: DC

## 2017-02-25 MED ORDER — IOPAMIDOL (ISOVUE-370) INJECTION 76%
100.0000 mL | Freq: Once | INTRAVENOUS | Status: AC | PRN
  Administered 2017-02-25: 100 mL via INTRAVENOUS

## 2017-02-25 NOTE — ED Notes (Signed)
Patient returned to room. 

## 2017-02-25 NOTE — ED Notes (Signed)
Patient to CT.

## 2017-02-25 NOTE — Telephone Encounter (Signed)
02/25/17 wife left a message to cx - she had to take him to the ER last night because of shortness of breath

## 2017-02-27 ENCOUNTER — Telehealth (HOSPITAL_COMMUNITY): Payer: Self-pay

## 2017-02-27 ENCOUNTER — Ambulatory Visit (HOSPITAL_COMMUNITY): Payer: Medicaid Other

## 2017-02-27 DIAGNOSIS — R2689 Other abnormalities of gait and mobility: Secondary | ICD-10-CM

## 2017-02-27 DIAGNOSIS — R262 Difficulty in walking, not elsewhere classified: Secondary | ICD-10-CM

## 2017-02-27 DIAGNOSIS — R2681 Unsteadiness on feet: Secondary | ICD-10-CM

## 2017-02-27 DIAGNOSIS — M6281 Muscle weakness (generalized): Secondary | ICD-10-CM

## 2017-02-27 NOTE — Telephone Encounter (Signed)
Called wife states she would be open to 4pm or later otherwise she doesn't want to come any earlier. NF 02/27/17

## 2017-02-27 NOTE — Patient Instructions (Signed)
Bridging    Slowly raise buttocks from floor, keeping stomach tight. Repeat 10 times per set. Do 2 sets per session. Do 1-2 sessions per day.  http://orth.exer.us/1096   Copyright  VHI. All rights reserved.   Abduction    Slide one leg out to side. Keep kneecap pointing up. Gently bring leg back to pillow. Repeat with other leg. Repeat 10  times. Do 1-2 sessions per day.  http://gt2.exer.us/373   Copyright  VHI. All rights reserved.   Knee Extension: Sit to Stand (Eccentric)    Stand close to chair. Slowly lower self to seated position. 5 reps per set, 1-2 sets per day Progress to stopping midway before lowering to chair. Progress to barely touching chair.  Copyright  VHI. All rights reserved.

## 2017-02-27 NOTE — Therapy (Signed)
[place], [place], [address] Phone: [phone]   Fax:  [phone]  Physical Therapy Treatment  Patient Details  Name: Raymond Barrera MRN: [id number] Date of Birth: 1961/01/10 Referring Provider: Lucia Gaskins   Encounter Date: 02/27/2017      PT End of Session - 02/27/17 1737    Visit Number 2   Number of Visits 9   Date for PT Re-Evaluation 03/17/17   Authorization Type Medicaid (visits pending)   Authorization Time Period 02/17/17 to 03/20/17   PT Start Time 1733   PT Stop Time 1820   PT Time Calculation (min) 47 min   Equipment Utilized During Treatment Gait belt   Activity Tolerance Patient tolerated treatment well   Behavior During Therapy [facility] for tasks assessed/performed      Past Medical History:  Diagnosis Date  . Anxiety    takes Valium daily as needed  . Arthritis   . Back pain    hx of buldging disc  . Diabetes mellitus without complication ([place])    takes Metformin daily  . Headache    migraines  . High cholesterol    takes Zocor daily  . Hypertension    takes Benazepril and HCTZ  daily  . Joint pain   . Joint swelling   . Memory impairment    occassional - from stroke  . Myocardial infarction 1987  . Pneumonia    hx of-80's  . PONV (postoperative nausea and vomiting)    took a while for him to wake up after previous anesthesia  . Shortness of breath dyspnea    with exertion  . Slurred speech   . Stroke ([place]) 08/2013  . TIA (transient ischemic attack) 2014   x 7   . Urinary frequency     Past Surgical History:  Procedure Laterality Date  . ANKLE SURGERY  2008   left ankle-otif-[facility]  . BACK SURGERY    . JOINT REPLACEMENT     both hips replaced   . LUMBAR LAMINECTOMY/DECOMPRESSION MICRODISCECTOMY Right 10/17/2014   Procedure: LUMBAR LAMINECTOMY/DECOMPRESSION MICRODISCECTOMY 2 LEVELS;  Surgeon: [name], MD;  Location: [facility];  Service: Neurosurgery;   Laterality: Right;  Right L45 L5S1 laminectomy and foraminotomy  . MASS EXCISION  09/13/2012   Procedure: EXCISION MASS;  Surgeon: Jamesetta So, MD;  Location: [facility];  Service: General;  Laterality: N/A;  . TONSILLECTOMY    . TOTAL HIP ARTHROPLASTY Right 03/20/2015  . TOTAL HIP ARTHROPLASTY Right 03/20/2015   Procedure: TOTAL HIP ARTHROPLASTY ANTERIOR APPROACH;  Surgeon: Renette Butters, MD;  Location: [facility];  Service: Orthopedics;  Laterality: Right;  . TOTAL HIP ARTHROPLASTY Left 01/08/2016   Procedure: TOTAL HIP ARTHROPLASTY ANTERIOR APPROACH;  Surgeon: Renette Butters, MD;  Location: [facility];  Service: Orthopedics;  Laterality: Left;    There were no vitals filed for this visit.      Subjective Assessment - 02/27/17 1735    Subjective Pt arrived with wife.  Reports he is feeling good today with no reports of pain.  Continues to have difficutly with Rt hand use, no reports of pain today.  reports he has been compliant with HEP.  Pt entered ambulating with SPC, no reports of LOB episodes or recent falls.  Continues with use walker for longer duration.     Pertinent History TIAx x7, HTN, CVA, DM, lumbar spondylosis, B hip replacements in 2016 and 2017, history of cocaine abuse, current smoker,  ankle surgery L, lumbar laminectomy/discectomy    Patient Stated Goals return to PLOF    Currently in Pain? No/denies                         [facility] Adult PT Treatment/Exercise - 02/27/17 0001      Exercises   Exercises Knee/Hip     Knee/Hip Exercises: Seated   Other Seated Knee/Hip Exercises [facility] rotation sitting on dynadisc   Marching 10 reps   Sit to Sand 3 sets;without UE support  elevated height, limited by Lt hip pain     Knee/Hip Exercises: Supine   Bridges 2 sets;10 reps   Other Supine Knee/Hip Exercises Hip ABD 10x each                PT Education - 02/27/17 1819    Education provided Yes   Education Details Reviewed goals, established HEP and assured  understanding and able to demonstrate appropriately, copy of eval given to pt.  Educated with importance of appropraite AD for safety.   Person(s) Educated Patient;Spouse   Methods Explanation;Demonstration;Handout   Comprehension Verbalized understanding;Returned demonstration          PT Short Term Goals - 02/17/17 1622      PT SHORT TERM GOAL #1   Title Patient and spouse to be able to identify 5/5 safety factors in order to reduce fall risk and improve general safety    Time 2   Period Weeks   Status New     PT SHORT TERM GOAL #2   Title Patient to be able to ambulate at least 53ft with LRAD during 3MWT in order to show improved mobility and home/community access    Time 2   Period Weeks   Status New     PT SHORT TERM GOAL #3   Title Patient to be able to complete TUG with LRAD in 15 seconds or less in order to show increased mobilty and reduced fall risk    Time 2   Period Weeks   Status New     PT SHORT TERM GOAL #4   Title Patient to be independent in correctly and consistently performing targeted HEP, to be updated as appropriate    Time 1   Period Weeks   Status New           PT Long Term Goals - 02/27/17 1837      PT LONG TERM GOAL #4   Title Patient to be participatory in regular exercise program and educated regarding the benefit of smoking cessation in order to assist in general prevention of another TIA or CVA based incident    Baseline 02/27/17:  Reports of stopped smoking for 3+ weeks.               Plan - 02/27/17 1748    Clinical Impression Statement Reviewed goals with pt and wife with copy of evaluation given to pt.  Pt and wife stated they have stopped smoking for 3 weeks + and feel great with achievement.  Established HEP with ability to demonstrated and verbalize technique correctly.  Session focus on improving proximal strengthening and improving sitting balance.  Pt limited by significant weakness with proximal musculature especially  Lt hip.  Reports of increased Lt hip pain following supine abduction exercises limiting ability to stand.  Pt educated on benefits of compliance with HEP for maximal benefits to assist with strengthening for pain.  Due to pain no standing  balance activities complete.  Pt educated on benefits/safety ambulating with RW vs. SPC with weakness, verbalized understanding.     Rehab Potential Fair   Clinical Impairments Affecting Rehab Potential (+) very supportive spouse, somewhat success with PT in the past; (-) history of cocaine abuse, multiple TIAs, severe hip and lumbar pain, possible insurance limitations    PT Frequency 2x / week   PT Duration 4 weeks   PT Treatment/Interventions ADLs/Self Care Home Management;DME Instruction;Gait training;Stair training;Functional mobility training;Therapeutic activities;Therapeutic exercise;Balance training;Neuromuscular re-education;Patient/family education;Manual techniques;Energy conservation   PT Next Visit Plan strong focus on sitting and standing balance exercises, core strength. Trial quadruped. Gait training, functional strength.   Next session(s) addition of NBOS/tandem stance to HEP for standing balance training.   PT Home Exercise Plan 03/30: Bridge, supine abduction and STS.        Patient will benefit from skilled therapeutic intervention in order to improve the following deficits and impairments:  Abnormal gait, Pain, Decreased coordination, Decreased mobility, Decreased activity tolerance, Decreased strength, Decreased balance, Decreased safety awareness, Difficulty walking  Visit Diagnosis: Difficulty in walking, not elsewhere classified  Unsteadiness on feet  Other abnormalities of gait and mobility  Muscle weakness (generalized)     Problem List Patient Active Problem List   Diagnosis Date Noted  . Acute CVA (cerebrovascular accident) ([place]) 02/01/2017  . Diabetes mellitus ([place]) 02/01/2017  . HTN (hypertension) 02/01/2017  . Primary  osteoarthritis of left hip 01/08/2016  . DJD (degenerative joint disease) 03/20/2015  . Primary osteoarthritis of right hip 02/26/2015  . Lumbar spondylosis 10/17/2014  . Lumbago 07/25/2014  . Abnormality of gait 07/25/2014  . Difficulty in walking(719.7) 07/25/2014  . TIA (transient ischemic attack) 03/09/2013  . Cocaine abuse 03/09/2013  . Accelerated hypertension 03/09/2013  . Hyperlipidemia 03/09/2013  . Current smoker 03/09/2013   Ihor Austin, LPTA; [place]  Aldona Lento 02/27/2017, 6:39 PM  [facility] [address] [place], [place], [address] Phone: [phone]   Fax:  [phone]  Name: Raymond Barrera MRN: [id number] Date of Birth: 1961-06-02

## 2017-03-02 ENCOUNTER — Encounter (HOSPITAL_COMMUNITY): Payer: Self-pay

## 2017-03-02 ENCOUNTER — Ambulatory Visit (HOSPITAL_COMMUNITY): Payer: Medicaid Other | Attending: Family Medicine | Admitting: Physical Therapy

## 2017-03-02 ENCOUNTER — Ambulatory Visit (HOSPITAL_COMMUNITY): Payer: Medicaid Other

## 2017-03-02 DIAGNOSIS — R1312 Dysphagia, oropharyngeal phase: Secondary | ICD-10-CM | POA: Diagnosis present

## 2017-03-02 DIAGNOSIS — R262 Difficulty in walking, not elsewhere classified: Secondary | ICD-10-CM | POA: Diagnosis present

## 2017-03-02 DIAGNOSIS — R2689 Other abnormalities of gait and mobility: Secondary | ICD-10-CM | POA: Diagnosis present

## 2017-03-02 DIAGNOSIS — R278 Other lack of coordination: Secondary | ICD-10-CM | POA: Diagnosis present

## 2017-03-02 DIAGNOSIS — I69822 Dysarthria following other cerebrovascular disease: Secondary | ICD-10-CM | POA: Diagnosis present

## 2017-03-02 DIAGNOSIS — M6281 Muscle weakness (generalized): Secondary | ICD-10-CM

## 2017-03-02 DIAGNOSIS — R29898 Other symptoms and signs involving the musculoskeletal system: Secondary | ICD-10-CM | POA: Diagnosis present

## 2017-03-02 DIAGNOSIS — R2681 Unsteadiness on feet: Secondary | ICD-10-CM | POA: Diagnosis present

## 2017-03-02 DIAGNOSIS — R41841 Cognitive communication deficit: Secondary | ICD-10-CM | POA: Insufficient documentation

## 2017-03-02 NOTE — Therapy (Signed)
[place], [place], [address] Phone: [phone]   Fax:  [phone]  Physical Therapy Treatment  Patient Details  Name: Raymond Barrera MRN: [id number] Date of Birth: December 28, 1960 Referring Provider: Lucia Gaskins   Encounter Date: 03/02/2017      PT End of Session - 03/02/17 1736    Visit Number 3   Number of Visits 9   Date for PT Re-Evaluation 03/17/17   Authorization Type Medicaid (8 through 4/17 )   Authorization Time Period 02/17/17 to 03/20/17   PT Start Time 1702  started on Nustep/got pulled away to answer front desk question    PT Stop Time 1730   PT Time Calculation (min) 28 min   Activity Tolerance Patient tolerated treatment well   Behavior During Therapy [facility] for tasks assessed/performed      Past Medical History:  Diagnosis Date  . Anxiety    takes Valium daily as needed  . Arthritis   . Back pain    hx of buldging disc  . Diabetes mellitus without complication ([place])    takes Metformin daily  . Headache    migraines  . High cholesterol    takes Zocor daily  . Hypertension    takes Benazepril and HCTZ  daily  . Joint pain   . Joint swelling   . Memory impairment    occassional - from stroke  . Myocardial infarction 1987  . Pneumonia    hx of-80's  . PONV (postoperative nausea and vomiting)    took a while for him to wake up after previous anesthesia  . Shortness of breath dyspnea    with exertion  . Slurred speech   . Stroke ([place]) 08/2013  . TIA (transient ischemic attack) 2014   x 7   . Urinary frequency     Past Surgical History:  Procedure Laterality Date  . ANKLE SURGERY  2008   left ankle-[facility]if-[facility]  . BACK SURGERY    . JOINT REPLACEMENT     both hips replaced   . LUMBAR LAMINECTOMY/DECOMPRESSION MICRODISCECTOMY Right 10/17/2014   Procedure: LUMBAR LAMINECTOMY/DECOMPRESSION MICRODISCECTOMY 2 LEVELS;  Surgeon: [name], MD;  Location: [facility];  Service:  Neurosurgery;  Laterality: Right;  Right L45 L5S1 laminectomy and foraminotomy  . MASS EXCISION  09/13/2012   Procedure: EXCISION MASS;  Surgeon: Jamesetta So, MD;  [facility];  Service: General;  Laterality: N/A;  . TONSILLECTOMY    . TOTAL HIP ARTHROPLASTY Right 03/20/2015  . TOTAL HIP ARTHROPLASTY Right 03/20/2015   Procedure: TOTAL HIP ARTHROPLASTY ANTERIOR APPROACH;  Surgeon: Renette Butters, MD;  Location: [facility];  Service: Orthopedics;  Laterality: Right;  . TOTAL HIP ARTHROPLASTY Left 01/08/2016   Procedure: TOTAL HIP ARTHROPLASTY ANTERIOR APPROACH;  Surgeon: Renette Butters, MD;  Location: [facility];  Service: Orthopedics;  Laterality: Left;    There were no vitals filed for this visit.      Subjective Assessment - 03/02/17 1704    Subjective Patient arrives today stating taht he is doing well, he is feeling much better since last time; no major changes recently    Pertinent History TIAx x7, HTN, CVA, DM, lumbar spondylosis, B hip replacements in 2016 and 2017, history of cocaine abuse, current smoker, ankle surgery L, lumbar laminectomy/discectomy    Currently in Pain? No/denies  [facility] Adult PT Treatment/Exercise - 03/02/17 0001      Knee/Hip Exercises: Aerobic   Nustep Level 2, seat 15, 8 minutes not included in billing      Knee/Hip Exercises: Standing   Heel Raises Both;1 set;15 reps   Heel Raises Limitations heel and toe    Lateral Step Up Both;1 set;15 reps   Lateral Step Up Limitations 4 inch box    Forward Step Up Both;1 set;15 reps   Forward Step Up Limitations 4 inch box     Knee/Hip Exercises: Seated   Long Arc Quad Both;1 set;15 reps   Long Arc Quad Weight 3 lbs.   Long [employer] Limitations 2 second holds    Sit to [employer] 1 set;10 reps;without UE support  cues for form and weight shift to weak side              Balance Exercises - 03/02/17 1734      Balance Exercises: Standing   Tandem Stance Eyes open;3  reps;15 secs   Other Standing Exercises cone rotations with progressing difficulty on solid surface, NO HHA min guard            PT Education - 03/02/17 1735    Education provided Yes   Education Details safety with functional transfers    Person(s) Educated Patient;Spouse   Methods Explanation   Comprehension Verbalized understanding          PT Short Term Goals - 03/02/17 1740      PT SHORT TERM GOAL #1   Title Patient and spouse to be able to identify 5/5 safety factors in order to reduce fall risk and improve general safety    Time 2   Period Weeks   Status New     PT SHORT TERM GOAL #2   Title Patient to be able to ambulate at least 517ft with LRAD during 3MWT in order to show improved mobility and home/community access    Time 2   Period Weeks   Status New     PT SHORT TERM GOAL #3   Title Patient to be able to complete TUG with LRAD in 15 seconds or less in order to show increased mobilty and reduced fall risk    Time 2   Period Weeks   Status New     PT SHORT TERM GOAL #4   Title Patient to be independent in correctly and consistently performing targeted HEP, to be updated as appropriate    Time 1   Period Weeks   Status New           PT Long Term Goals - 03/02/17 1740      PT LONG TERM GOAL #1   Title Patient to demonstrate functional strength at least 4/5 in order to improve gait and overall mobility    Time 4   Period Weeks   Status New     PT LONG TERM GOAL #2   Title Patient to be able to ambulate unlimited distances, untimed, with LRAD in order to show improved mobility and functional activity tolerance    Time 4   Period Weeks   Status New     PT LONG TERM GOAL #3   Title Patient to be able to independently dress and perform self care based tasks in unsupported standing with no LOB or concern for fall in order to improve general function and QOL    Time 4   Period Weeks   Status New  PT LONG TERM GOAL #4   Title Patient to be  participatory in regular exercise program and educated regarding the benefit of smoking cessation in order to assist in general prevention of another TIA or CVA based incident    Time 4   Period Weeks   Status New               Plan - 03/02/17 1737    Clinical Impression Statement Patient arrives today in good spirits, reports he is feeling much better as compared to when he was first evaluated. Focused on functional strength today as patient reports significant concerns regarding strength return in his left leg; pain patterns appear to have generally improved at this time. Cues provided for form and safety throughout session today, also noted that patient is limited in exercise tolerance due to hip pain, and pace/difficulty of exercises/activities were modified PRN during session to adjust for this.    Rehab Potential Fair   Clinical Impairments Affecting Rehab Potential (+) very supportive spouse, somewhat success with PT in the past; (-) history of cocaine abuse, multiple TIAs, severe hip and lumbar pain, possible insurance limitations    PT Frequency 2x / week   PT Duration 4 weeks   PT Treatment/Interventions ADLs/Self Care Home Management;DME Instruction;Gait training;Stair training;Functional mobility training;Therapeutic activities;Therapeutic exercise;Balance training;Neuromuscular re-education;Patient/family education;Manual techniques;Energy conservation   PT Next Visit Plan functional strength as tolerated in CKC positions, continue progressing balance. Continue Nustep    Consulted and Agree with Plan of Care Patient;Family member/caregiver   Family Member Consulted Spouse       Patient will benefit from skilled therapeutic intervention in order to improve the following deficits and impairments:  Abnormal gait, Pain, Decreased coordination, Decreased mobility, Decreased activity tolerance, Decreased strength, Decreased balance, Decreased safety awareness, Difficulty  walking  Visit Diagnosis: Difficulty in walking, not elsewhere classified  Unsteadiness on feet  Other abnormalities of gait and mobility  Muscle weakness (generalized)     Problem List Patient Active Problem List   Diagnosis Date Noted  . Acute CVA (cerebrovascular accident) ([place]) 02/01/2017  . Diabetes mellitus ([place]) 02/01/2017  . HTN (hypertension) 02/01/2017  . Primary osteoarthritis of left hip 01/08/2016  . DJD (degenerative joint disease) 03/20/2015  . Primary osteoarthritis of right hip 02/26/2015  . Lumbar spondylosis 10/17/2014  . Lumbago 07/25/2014  . Abnormality of gait 07/25/2014  . Difficulty in walking(719.7) 07/25/2014  . TIA (transient ischemic attack) 03/09/2013  . Cocaine abuse 03/09/2013  . Accelerated hypertension 03/09/2013  . Hyperlipidemia 03/09/2013  . Current smoker 03/09/2013    Deniece Ree PT, DPT [facility] [address] [place], [place], [address] Phone: [phone]   Fax:  [phone]  Name: Raymond Barrera MRN: [id number] Date of Birth: 1961-02-10

## 2017-03-02 NOTE — Patient Instructions (Signed)
Home Exercises Program Theraputty Exercises  Do the following exercises 2-3 times a day using your affected hand.  1. Roll putty into a ball.  2. Make into a pancake.  3. Roll putty into a roll.  4. Pinch along log with first finger and thumb.   5. Make into a ball.  6. Roll it back into a log.   7. Pinch using thumb and side of first finger.  8. Roll into a ball, then flatten into a pancake.  9. Using your fingers, make putty into a mountain.  10. Roll into a ball and squeeze 10-15 times.

## 2017-03-02 NOTE — Therapy (Signed)
[place], [place], [address] Phone: [phone]   Fax:  [phone]  Occupational Therapy Treatment  Patient Details  Name: Raymond Barrera MRN: [id number] Date of Birth: 1961-05-05 Referring Provider: Dr. Cindie Laroche  Encounter Date: 03/02/2017      OT End of Session - 03/02/17 1720    Visit Number 2   Number of Visits 12   Date for OT Re-Evaluation 03/31/17   Authorization Type Medicaid   Authorization Time Period Approved 8 visits (02/23/17-03/22/17)   Authorization - Visit Number 1   Authorization - Number of Visits 8   OT Start Time 1430   OT Stop Time 1515   OT Time Calculation (min) 45 min   Activity Tolerance Patient tolerated treatment well   Behavior During Therapy [facility] for tasks assessed/performed      Past Medical History:  Diagnosis Date  . Anxiety    takes Valium daily as needed  . Arthritis   . Back pain    hx of buldging disc  . Diabetes mellitus without complication ([place])    takes Metformin daily  . Headache    migraines  . High cholesterol    takes Zocor daily  . Hypertension    takes Benazepril and HCTZ  daily  . Joint pain   . Joint swelling   . Memory impairment    occassional - from stroke  . Myocardial infarction 1987  . Pneumonia    hx of-80's  . PONV (postoperative nausea and vomiting)    took a while for him to wake up after previous anesthesia  . Shortness of breath dyspnea    with exertion  . Slurred speech   . Stroke ([place]) 08/2013  . TIA (transient ischemic attack) 2014   x 7   . Urinary frequency     Past Surgical History:  Procedure Laterality Date  . ANKLE SURGERY  2008   left ankle-otif-[facility]  . BACK SURGERY    . JOINT REPLACEMENT     both hips replaced   . LUMBAR LAMINECTOMY/DECOMPRESSION MICRODISCECTOMY Right 10/17/2014   Procedure: LUMBAR LAMINECTOMY/DECOMPRESSION MICRODISCECTOMY 2 LEVELS;  Surgeon: [name], MD;  Location: [place] [facility];   Service: Neurosurgery;  Laterality: Right;  Right L45 L5S1 laminectomy and foraminotomy  . MASS EXCISION  09/13/2012   Procedure: EXCISION MASS;  Surgeon: Jamesetta So, MD;  [facility];  Service: General;  Laterality: N/A;  . TONSILLECTOMY    . TOTAL HIP ARTHROPLASTY Right 03/20/2015  . TOTAL HIP ARTHROPLASTY Right 03/20/2015   Procedure: TOTAL HIP ARTHROPLASTY ANTERIOR APPROACH;  Surgeon: Renette Butters, MD;  Location: [place];  Service: Orthopedics;  Laterality: Right;  . TOTAL HIP ARTHROPLASTY Left 01/08/2016   Procedure: TOTAL HIP ARTHROPLASTY ANTERIOR APPROACH;  Surgeon: Renette Butters, MD;  Location: [place];  Service: Orthopedics;  Laterality: Left;    There were no vitals filed for this visit.      Subjective Assessment - 03/02/17 1458    Subjective  S: Is it possible to get some putty? I have a hard time holding onto items and I drop them.    Currently in Pain? No/denies            [facility] OT Assessment - 03/02/17 1500      Assessment   Diagnosis Right Arm Weakness and Lack of Coordination S/P L CVA     Precautions   Precautions Fall     Coordination   Box  and Blocks R: 44 L: 63                  OT Treatments/Exercises (OP) - 03/02/17 1500      ADLs   ADL Education Given Yes   Tub Transfer Bench Pt and wife were educated on proper shower transfer technique utlizing tub bench. Discussed safety aspect of activity. Recommend getting a grab bar if needed.      Exercises   Exercises Hand     Hand Exercises   Sponges 13,16  high resistive     Fine Motor Coordination   Fine Motor Coordination Stacking coins;Manipulating coins;Picking up coins;Dealing card with thumb;Flipping cards   Flipping cards Cards flipped one at a time with right hand; min difficulty.    Dealing card with thumb Cards dealt with thumb with min difficulty until stack became smaller in which it was mod difficulty.   Picking up coins Patient picked up coins transfering them from  fingertip to palm and back to fingertip with min-mod difficulty   Stacking coins Patient stacked coins with a tower of 5 with min difficulty.                 OT Education - 03/02/17 1715    Education provided Yes   Education Details Pt was given print out of OT evaluation, reviewed goals and Plan of care. patient reports decreased sustained grip strength when having to hold onto items for a longer time period. Requests a goal to be made for sustained grip strengthening. Pt was given green and blue theraputty for grip strengthening.    Person(s) Educated Patient;Spouse   Methods Explanation;Demonstration;Handout;Verbal cues   Comprehension Verbalized understanding          OT Short Term Goals - 03/02/17 1722      OT SHORT TERM GOAL #1   Title Patient will be educated on a HEP for improved [place] in right arm needed for return to PLOF.   Time 3   Period Weeks   Status On-going     OT SHORT TERM GOAL #2   Title Patient will improve GMC in RUE as demonstrated by a 50% improvement in rapid supination/pronation task.   Time 3   Period Weeks   Status On-going     OT SHORT TERM GOAL #3   Title Patient will improve fine motor coordination in his right hand for improved independence with manipulating tools when working on his car by improving completion speed of nine hole peg test by 4 seconds.     Time 3   Period Weeks   Status On-going     OT SHORT TERM GOAL #4   Title Patient will be educated on safe tub transfer using tub transfer bench.   Time 3   Period Weeks   Status On-going     OT SHORT TERM GOAL #5   Title Patient will improve handwriting fluidity to 85%.     Time 3   Period Weeks   Status On-going           OT Long Term Goals - 03/02/17 1722      OT LONG TERM GOAL #1   Title Patient will return to prior level of function with all B/IADLs and leisure activities. 6   Time 6   Period Weeks   Status On-going     OT LONG TERM GOAL #2   Title  Patient will improve GMC in right arm to 90% for improved  safety with ADL completion.   Time 6   Period Weeks   Status On-going     OT LONG TERM GOAL #3   Title Patient will improve right hand fine motor coordination for improved accuracy with home maitenance tasks by increasing speed on nine hole peg test to 25" or better.    Time 6   Period Weeks   Status On-going     OT LONG TERM GOAL #4   Title Patient will improve handwriting to 90% fluidity.     Time 6   Period Weeks   Status On-going     OT LONG TERM GOAL #5   Title Patient will present with increased sustained grip strength as he reports less difficulty with holding onto items for greater than 3 minutes with less drops.   Time 6   Period Weeks   Status New               Plan - 03/02/17 1723    Clinical Impression Statement A: Patient and wife requesting a goal to increase sustained grip strength. They mentioned that at times he is unable to hold onto items and drops them due to hand fatigue.    Plan P: Add grooved pegboard. Complete mini reassessment. Request extension from Medicaid if needed. Last date of medication approval is 4/22.      Patient will benefit from skilled therapeutic intervention in order to improve the following deficits and impairments:  Decreased coordination  Visit Diagnosis: Other lack of coordination    Problem List Patient Active Problem List   Diagnosis Date Noted  . Acute CVA (cerebrovascular accident) ([place]) 02/01/2017  . Diabetes mellitus ([place]) 02/01/2017  . HTN (hypertension) 02/01/2017  . Primary osteoarthritis of left hip 01/08/2016  . DJD (degenerative joint disease) 03/20/2015  . Primary osteoarthritis of right hip 02/26/2015  . Lumbar spondylosis 10/17/2014  . Lumbago 07/25/2014  . Abnormality of gait 07/25/2014  . Difficulty in walking(719.7) 07/25/2014  . TIA (transient ischemic attack) 03/09/2013  . Cocaine abuse 03/09/2013  . Accelerated hypertension 03/09/2013   . Hyperlipidemia 03/09/2013  . Current smoker 03/09/2013   Ailene Ravel, OTR/L,CBIS  [phone]  03/02/2017, 5:26 PM  [place] [address] [place], [place], [address] Phone: [phone]   Fax:  [phone]  Name: Raymond Barrera MRN: [id number] Date of Birth: Aug 07, 1961

## 2017-03-05 ENCOUNTER — Ambulatory Visit (HOSPITAL_COMMUNITY): Payer: Medicaid Other | Admitting: Physical Therapy

## 2017-03-05 DIAGNOSIS — M6281 Muscle weakness (generalized): Secondary | ICD-10-CM

## 2017-03-05 DIAGNOSIS — R2689 Other abnormalities of gait and mobility: Secondary | ICD-10-CM

## 2017-03-05 DIAGNOSIS — R262 Difficulty in walking, not elsewhere classified: Secondary | ICD-10-CM

## 2017-03-05 DIAGNOSIS — R2681 Unsteadiness on feet: Secondary | ICD-10-CM

## 2017-03-05 NOTE — Therapy (Signed)
[place], [place], [address] Phone: [phone]   Fax:  [phone]  Physical Therapy Treatment  Patient Details  Name: Raymond Barrera MRN: [id number] Date of Birth: 1961-04-29 Referring Provider: Lucia Gaskins   Encounter Date: 03/05/2017      PT End of Session - 03/05/17 1732    Visit Number 4   Number of Visits 9   Date for PT Re-Evaluation 03/17/17   Authorization Type Medicaid (8 through 4/17 )   Authorization Time Period 02/17/17 to 03/20/17   PT Start Time 1655   PT Stop Time 1725   PT Time Calculation (min) 30 min   Activity Tolerance Patient tolerated treatment well   Behavior During Therapy [facility] for tasks assessed/performed      Past Medical History:  Diagnosis Date  . Anxiety    takes Valium daily as needed  . Arthritis   . Back pain    hx of buldging disc  . Diabetes mellitus without complication ([place])    takes Metformin daily  . Headache    migraines  . High cholesterol    takes Zocor daily  . Hypertension    takes Benazepril and HCTZ  daily  . Joint pain   . Joint swelling   . Memory impairment    occassional - from stroke  . Myocardial infarction 1987  . Pneumonia    hx of-80's  . PONV (postoperative nausea and vomiting)    took a while for him to wake up after previous anesthesia  . Shortness of breath dyspnea    with exertion  . Slurred speech   . Stroke ([place]) 08/2013  . TIA (transient ischemic attack) 2014   x 7   . Urinary frequency     Past Surgical History:  Procedure Laterality Date  . ANKLE SURGERY  2008   left ankle-otif-[facility]  . BACK SURGERY    . JOINT REPLACEMENT     both hips replaced   . LUMBAR LAMINECTOMY/DECOMPRESSION MICRODISCECTOMY Right 10/17/2014   Procedure: LUMBAR LAMINECTOMY/DECOMPRESSION MICRODISCECTOMY 2 LEVELS;  Surgeon: [name], MD;  Location: [place] [facility];  Service: Neurosurgery;  Laterality: Right;  Right L45 L5S1 laminectomy and  foraminotomy  . MASS EXCISION  09/13/2012   Procedure: EXCISION MASS;  Surgeon: Jamesetta So, MD;  [facility];  Service: General;  Laterality: N/A;  . TONSILLECTOMY    . TOTAL HIP ARTHROPLASTY Right 03/20/2015  . TOTAL HIP ARTHROPLASTY Right 03/20/2015   Procedure: TOTAL HIP ARTHROPLASTY ANTERIOR APPROACH;  Surgeon: Renette Butters, MD;  Location: [place];  Service: Orthopedics;  Laterality: Right;  . TOTAL HIP ARTHROPLASTY Left 01/08/2016   Procedure: TOTAL HIP ARTHROPLASTY ANTERIOR APPROACH;  Surgeon: Renette Butters, MD;  Location: [place];  Service: Orthopedics;  Laterality: Left;    There were no vitals filed for this visit.      Subjective Assessment - 03/05/17 1701    Subjective Patient arrives stating that he really felt tired after last session, it was a challenge. No other major changes.    Pertinent History TIAx x7, HTN, CVA, DM, lumbar spondylosis, B hip replacements in 2016 and 2017, history of cocaine abuse, current smoker, ankle surgery L, lumbar laminectomy/discectomy    Patient Stated Goals return to PLOF    Currently in Pain? No/denies                         [facility] Adult PT  Treatment/Exercise - 03/05/17 0001      Knee/Hip Exercises: Standing   Heel Raises Both;1 set;15 reps   Heel Raises Limitations heel and toe    Forward Lunges Both;1 set;15 reps   Forward Lunges Limitations 4 inch box    Forward Step Up 1 set;10 reps;Both   Forward Step Up Limitations 6 inch box    Functional Squat 15 reps   Functional Squat Limitations cues for form    Other Standing Knee Exercises staggered squat with L LE back 1x10      Nustep level 3 seat 15, x8 minutes not included in billing         Balance Exercises - 03/05/17 1715      Balance Exercises: Standing   Rockerboard Anterior/posterior;Lateral;EO;Intermittent UE support  x2 minutes tolerance limited by L hip/knee pain            PT Education - 03/05/17 1731    Education provided Yes    Education Details continue HEP and walking as much as possible, we will work on transitioning away from walker; modifications of next session to avoid/reduce hip/knee pain aggravation    Person(s) Educated Patient;Spouse   Methods Explanation   Comprehension Verbalized understanding          PT Short Term Goals - 03/02/17 1740      PT SHORT TERM GOAL #1   Title Patient and spouse to be able to identify 5/5 safety factors in order to reduce fall risk and improve general safety    Time 2   Period Weeks   Status New     PT SHORT TERM GOAL #2   Title Patient to be able to ambulate at least 543ft with LRAD during 3MWT in order to show improved mobility and home/community access    Time 2   Period Weeks   Status New     PT SHORT TERM GOAL #3   Title Patient to be able to complete TUG with LRAD in 15 seconds or less in order to show increased mobilty and reduced fall risk    Time 2   Period Weeks   Status New     PT SHORT TERM GOAL #4   Title Patient to be independent in correctly and consistently performing targeted HEP, to be updated as appropriate    Time 1   Period Weeks   Status New           PT Long Term Goals - 03/02/17 1740      PT LONG TERM GOAL #1   Title Patient to demonstrate functional strength at least 4/5 in order to improve gait and overall mobility    Time 4   Period Weeks   Status New     PT LONG TERM GOAL #2   Title Patient to be able to ambulate unlimited distances, untimed, with LRAD in order to show improved mobility and functional activity tolerance    Time 4   Period Weeks   Status New     PT LONG TERM GOAL #3   Title Patient to be able to independently dress and perform self care based tasks in unsupported standing with no LOB or concern for fall in order to improve general function and QOL    Time 4   Period Weeks   Status New     PT LONG TERM GOAL #4   Title Patient to be participatory in regular exercise program and educated regarding  the benefit of smoking cessation in  order to assist in general prevention of another TIA or CVA based incident    Time 4   Period Weeks   Status New               Plan - 03/05/17 1732    Clinical Impression Statement Progressed Nustep settings today, followed by progressions of CKC functional strengthening primarily today with focus on loading L LE for muscle strength and activation today with improved functional activity tolerance and form noted today. When attempted rockerboard laterally for balance however, patient began to have severe L Hip pain that radiated down to knee and was returned to seated position. Patient/family report that when it hits this point it is fairly disabling and he usually needs to stop, so PT stopped a few minutes early to accommodate today, will plan to modify session next week to avoid this happening moving forward.    Rehab Potential Fair   Clinical Impairments Affecting Rehab Potential (+) very supportive spouse, somewhat success with PT in the past; (-) history of cocaine abuse, multiple TIAs, severe hip and lumbar pain, possible insurance limitations    PT Frequency 2x / week   PT Duration 4 weeks   PT Treatment/Interventions ADLs/Self Care Home Management;DME Instruction;Gait training;Stair training;Functional mobility training;Therapeutic activities;Therapeutic exercise;Balance training;Neuromuscular re-education;Patient/family education;Manual techniques;Energy conservation   PT Next Visit Plan modify exercise via alternating between CKC and [place] exercise to avoid hip/knee pain exacerbation. Increas focus on balance, conitnue Nustep. Gait with no device. HEP update.    PT Home Exercise Plan 03/30: Bridge, supine abduction and STS.     Consulted and Agree with Plan of Care Patient;Family member/caregiver   Family Member Consulted Spouse       Patient will benefit from skilled therapeutic intervention in order to improve the following deficits and  impairments:  Abnormal gait, Pain, Decreased coordination, Decreased mobility, Decreased activity tolerance, Decreased strength, Decreased balance, Decreased safety awareness, Difficulty walking  Visit Diagnosis: Difficulty in walking, not elsewhere classified  Unsteadiness on feet  Other abnormalities of gait and mobility  Muscle weakness (generalized)     Problem List Patient Active Problem List   Diagnosis Date Noted  . Acute CVA (cerebrovascular accident) ([place]) 02/01/2017  . Diabetes mellitus ([place]) 02/01/2017  . HTN (hypertension) 02/01/2017  . Primary osteoarthritis of left hip 01/08/2016  . DJD (degenerative joint disease) 03/20/2015  . Primary osteoarthritis of right hip 02/26/2015  . Lumbar spondylosis 10/17/2014  . Lumbago 07/25/2014  . Abnormality of gait 07/25/2014  . Difficulty in walking(719.7) 07/25/2014  . TIA (transient ischemic attack) 03/09/2013  . Cocaine abuse 03/09/2013  . Accelerated hypertension 03/09/2013  . Hyperlipidemia 03/09/2013  . Current smoker 03/09/2013    Deniece Ree PT, DPT [place] [address] [place], [place], [address] Phone: [phone]   Fax:  [phone]  Name: IMRE VECCHIONE MRN: [id number] Date of Birth: 06-21-1961

## 2017-03-10 ENCOUNTER — Encounter (HOSPITAL_COMMUNITY): Payer: Self-pay | Admitting: Speech Pathology

## 2017-03-10 ENCOUNTER — Ambulatory Visit (HOSPITAL_COMMUNITY): Payer: Medicaid Other | Admitting: Speech Pathology

## 2017-03-10 ENCOUNTER — Ambulatory Visit (HOSPITAL_COMMUNITY): Payer: Medicaid Other | Admitting: Physical Therapy

## 2017-03-10 DIAGNOSIS — R262 Difficulty in walking, not elsewhere classified: Secondary | ICD-10-CM | POA: Diagnosis not present

## 2017-03-10 DIAGNOSIS — R2681 Unsteadiness on feet: Secondary | ICD-10-CM

## 2017-03-10 DIAGNOSIS — M6281 Muscle weakness (generalized): Secondary | ICD-10-CM

## 2017-03-10 DIAGNOSIS — R1312 Dysphagia, oropharyngeal phase: Secondary | ICD-10-CM

## 2017-03-10 DIAGNOSIS — R2689 Other abnormalities of gait and mobility: Secondary | ICD-10-CM

## 2017-03-10 DIAGNOSIS — I69822 Dysarthria following other cerebrovascular disease: Secondary | ICD-10-CM

## 2017-03-10 DIAGNOSIS — R41841 Cognitive communication deficit: Secondary | ICD-10-CM

## 2017-03-10 NOTE — Therapy (Signed)
[place], [place], [address] Phone: [phone]   Fax:  [phone]  Physical Therapy Treatment  Patient Details  Name: Raymond Barrera MRN: [id number] Date of Birth: [birth year] Referring Provider: Lucia Gaskins   Encounter Date: 03/10/2017      PT End of Session - 03/10/17 1754    Visit Number 5   Number of Visits 9   Date for PT Re-Evaluation 03/17/17   Authorization Type Medicaid (8 through 4/17 )   Authorization Time Period 02/17/17 to 03/20/17   PT Start Time 1524  patient arrived late    PT Stop Time 1556   PT Time Calculation (min) 32 min   Equipment Utilized During Treatment Gait belt   Activity Tolerance Patient tolerated treatment well   Behavior During Therapy [facility] for tasks assessed/performed      Past Medical History:  Diagnosis Date  . Anxiety    takes Valium daily as needed  . Arthritis   . Back pain    hx of buldging disc  . Diabetes mellitus without complication ([place])    takes Metformin daily  . Headache    migraines  . High cholesterol    takes Zocor daily  . Hypertension    takes Benazepril and HCTZ  daily  . Joint pain   . Joint swelling   . Memory impairment    occassional - from stroke  . Myocardial infarction 1987  . Pneumonia    hx of-80's  . PONV (postoperative nausea and vomiting)    took a while for him to wake up after previous anesthesia  . Shortness of breath dyspnea    with exertion  . Slurred speech   . Stroke ([place]) 08/2013  . TIA (transient ischemic attack) 2014   x 7   . Urinary frequency     Past Surgical History:  Procedure Laterality Date  . ANKLE SURGERY  2008   left ankle-otif-[facility]  . BACK SURGERY    . JOINT REPLACEMENT     both hips replaced   . LUMBAR LAMINECTOMY/DECOMPRESSION MICRODISCECTOMY Right 10/17/2014   Procedure: LUMBAR LAMINECTOMY/DECOMPRESSION MICRODISCECTOMY 2 LEVELS;  Surgeon: [name], MD;  Location: [place] [facility];   Service: Neurosurgery;  Laterality: Right;  Right L45 L5S1 laminectomy and foraminotomy  . MASS EXCISION  09/13/2012   Procedure: EXCISION MASS;  Surgeon: Jamesetta So, MD;  [facility];  Service: General;  Laterality: N/A;  . TONSILLECTOMY    . TOTAL HIP ARTHROPLASTY Right 03/20/2015  . TOTAL HIP ARTHROPLASTY Right 03/20/2015   Procedure: TOTAL HIP ARTHROPLASTY ANTERIOR APPROACH;  Surgeon: Renette Butters, MD;  Location: [place];  Service: Orthopedics;  Laterality: Right;  . TOTAL HIP ARTHROPLASTY Left 01/08/2016   Procedure: TOTAL HIP ARTHROPLASTY ANTERIOR APPROACH;  Surgeon: Renette Butters, MD;  Location: [place];  Service: Orthopedics;  Laterality: Left;    There were no vitals filed for this visit.      Subjective Assessment - 03/10/17 1527    Subjective Patient arrives stating that his legs are still hurting, he is still sore from pain exacerbation last session. He cannot lift his left leg at all now, it just hurts too much.    Pertinent History TIAx x7, HTN, CVA, DM, lumbar spondylosis, B hip replacements in 2016 and 2017, history of cocaine abuse, current smoker, ankle surgery L, lumbar laminectomy/discectomy    Patient Stated Goals return to PLOF    Currently in Pain? Yes  Pain Score 8    Pain Location Hip   Pain Orientation Right;Left   Pain Descriptors / Indicators Throbbing   Pain Type Chronic pain   Pain Radiating Towards down both legs    Pain Onset More than a month ago   Pain Frequency Constant   Aggravating Factors  not sure, turning over in bed    Pain Relieving Factors nothing but pain medicine but this is not consistent    Effect of Pain on Daily Activities severe impact                               Balance Exercises - 03/10/17 1529      Balance Exercises: Standing   Standing Eyes Closed Narrow base of support (BOS);Foam/compliant surface;3 reps;30 secs   Tandem Stance Eyes open;3 reps;20 secs;Foam/compliant surface   SLS Eyes  open;2 reps;15 secs;Modified   Tandem Gait Forward;4 reps;Other (comment)  inside parallel bars    Other Standing Exercises cone rotations standing on foam in wide BOS and semi-tandem stance            PT Education - 03/10/17 1754    Education provided No          PT Short Term Goals - 03/02/17 1740      PT SHORT TERM GOAL #1   Title Patient and spouse to be able to identify 5/5 safety factors in order to reduce fall risk and improve general safety    Time 2   Period Weeks   Status New     PT SHORT TERM GOAL #2   Title Patient to be able to ambulate at least 539ft with LRAD during 3MWT in order to show improved mobility and home/community access    Time 2   Period Weeks   Status New     PT SHORT TERM GOAL #3   Title Patient to be able to complete TUG with LRAD in 15 seconds or less in order to show increased mobilty and reduced fall risk    Time 2   Period Weeks   Status New     PT SHORT TERM GOAL #4   Title Patient to be independent in correctly and consistently performing targeted HEP, to be updated as appropriate    Time 1   Period Weeks   Status New           PT Long Term Goals - 03/02/17 1740      PT LONG TERM GOAL #1   Title Patient to demonstrate functional strength at least 4/5 in order to improve gait and overall mobility    Time 4   Period Weeks   Status New     PT LONG TERM GOAL #2   Title Patient to be able to ambulate unlimited distances, untimed, with LRAD in order to show improved mobility and functional activity tolerance    Time 4   Period Weeks   Status New     PT LONG TERM GOAL #3   Title Patient to be able to independently dress and perform self care based tasks in unsupported standing with no LOB or concern for fall in order to improve general function and QOL    Time 4   Period Weeks   Status New     PT LONG TERM GOAL #4   Title Patient to be participatory in regular exercise program and educated regarding the benefit of  smoking cessation  in order to assist in general prevention of another TIA or CVA based incident    Time 4   Period Weeks   Status New               Plan - 03/10/17 1755    Clinical Impression Statement Patient arrives today reporting that his hips continue to bother him with 8/10 pain noted today; session modified today due to high pain levels and easy irritability of hips. Focused primarily on balance today with min guard and cues for form, noting difficult with dynamic tasks in situations involving narrow BOS. Plan to continue with return to strengthening/more intense CKC work as tolerated when hip pain is not as intense.    Rehab Potential Fair   Clinical Impairments Affecting Rehab Potential (+) very supportive spouse, somewhat success with PT in the past; (-) history of cocaine abuse, multiple TIAs, severe hip and lumbar pain, possible insurance limitations    PT Frequency 2x / week   PT Duration 4 weeks   PT Treatment/Interventions ADLs/Self Care Home Management;DME Instruction;Gait training;Stair training;Functional mobility training;Therapeutic activities;Therapeutic exercise;Balance training;Neuromuscular re-education;Patient/family education;Manual techniques;Energy conservation   PT Next Visit Plan modify exercise via alternating between CKC and [place] exercise to avoid hip/knee pain exacerbation. Increas focus on balance, conitnue Nustep. Gait with no device. HEP update.    PT Home Exercise Plan 03/30: Bridge, supine abduction and STS.     Consulted and Agree with Plan of Care Patient      Patient will benefit from skilled therapeutic intervention in order to improve the following deficits and impairments:  Abnormal gait, Pain, Decreased coordination, Decreased mobility, Decreased activity tolerance, Decreased strength, Decreased balance, Decreased safety awareness, Difficulty walking  Visit Diagnosis: Difficulty in walking, not elsewhere classified  Unsteadiness on  feet  Other abnormalities of gait and mobility  Muscle weakness (generalized)     Problem List Patient Active Problem List   Diagnosis Date Noted  . Acute CVA (cerebrovascular accident) ([place]) 02/01/2017  . Diabetes mellitus ([place]) 02/01/2017  . HTN (hypertension) 02/01/2017  . Primary osteoarthritis of left hip 01/08/2016  . DJD (degenerative joint disease) 03/20/2015  . Primary osteoarthritis of right hip 02/26/2015  . Lumbar spondylosis 10/17/2014  . Lumbago 07/25/2014  . Abnormality of gait 07/25/2014  . Difficulty in walking(719.7) 07/25/2014  . TIA (transient ischemic attack) 03/09/2013  . Cocaine abuse 03/09/2013  . Accelerated hypertension 03/09/2013  . Hyperlipidemia 03/09/2013  . Current smoker 03/09/2013    Deniece Ree PT, DPT [place] [address] [place], [place], [address] Phone: [phone]   Fax:  [phone]  Name: Raymond Barrera MRN: [id number] Date of Birth: 04-Aug-1961

## 2017-03-10 NOTE — Therapy (Signed)
[place], [place], [address] Phone: [phone]   Fax:  [phone]  Speech Language Pathology Treatment  Patient Details  Name: Raymond Barrera MRN: [id number] Date of Birth: 03-Aug-1961 Referring Provider: Lucia Gaskins, MD  Encounter Date: 03/10/2017      End of Session - 03/10/17 1929    Visit Number 2   Number of Visits 8   Authorization Type Medicaid   Authorization Time Period 02/18/2017-03/17/2017   Authorization - Visit Number 1   Authorization - Number of Visits 8   SLP Start Time 1600   SLP Stop Time  1856   SLP Time Calculation (min) 45 min   Activity Tolerance Patient tolerated treatment well      Past Medical History:  Diagnosis Date  . Anxiety    takes Valium daily as needed  . Arthritis   . Back pain    hx of buldging disc  . Diabetes mellitus without complication ([place])    takes Metformin daily  . Headache    migraines  . High cholesterol    takes Zocor daily  . Hypertension    takes Benazepril and HCTZ  daily  . Joint pain   . Joint swelling   . Memory impairment    occassional - from stroke  . Myocardial infarction 1987  . Pneumonia    hx of-80's  . PONV (postoperative nausea and vomiting)    took a while for him to wake up after previous anesthesia  . Shortness of breath dyspnea    with exertion  . Slurred speech   . Stroke ([place]) 08/2013  . TIA (transient ischemic attack) 2014   x 7   . Urinary frequency     Past Surgical History:  Procedure Laterality Date  . ANKLE SURGERY  2008   left ankle-otif-[facility]  . BACK SURGERY    . JOINT REPLACEMENT     both hips replaced   . LUMBAR LAMINECTOMY/DECOMPRESSION MICRODISCECTOMY Right 10/17/2014   Procedure: LUMBAR LAMINECTOMY/DECOMPRESSION MICRODISCECTOMY 2 LEVELS;  Surgeon: [name], MD;  Location: [place] [facility];  Service: Neurosurgery;  Laterality: Right;  Right L45 L5S1 laminectomy and foraminotomy  . MASS EXCISION   09/13/2012   Procedure: EXCISION MASS;  Surgeon: Jamesetta So, MD;  [facility];  Service: General;  Laterality: N/A;  . TONSILLECTOMY    . TOTAL HIP ARTHROPLASTY Right 03/20/2015  . TOTAL HIP ARTHROPLASTY Right 03/20/2015   Procedure: TOTAL HIP ARTHROPLASTY ANTERIOR APPROACH;  Surgeon: Renette Butters, MD;  Location: [place];  Service: Orthopedics;  Laterality: Right;  . TOTAL HIP ARTHROPLASTY Left 01/08/2016   Procedure: TOTAL HIP ARTHROPLASTY ANTERIOR APPROACH;  Surgeon: Renette Butters, MD;  Location: [place];  Service: Orthopedics;  Laterality: Left;    There were no vitals filed for this visit.      Subjective Assessment - 03/10/17 1920    Subjective "I became short of breath one night."   Patient is accompained by: Family member   Currently in Pain? No/denies          ADULT SLP TREATMENT - 03/10/17 0001      General Information   Behavior/Cognition Alert;Cooperative;Pleasant mood   Patient Positioning Upright in chair   Oral care provided N/A   HPI Raymond Barrera  is a 56 y.o. male. Patient was brought by his wife for complaints of right-sided weakness with symptoms starting Friday evening. Saturday late afternoon he developed some dysarthria with right facial  droop. When he woke up on Sunday he started to notice right-sided weakness prompting him to come to ED. In ED workup significant for subacute CVA with no evidence of hemorrhage. MRI showed acute infarct in the left putamen and internal capsule posteriorly.     Treatment Provided   Treatment provided Dysphagia;Cognitive-Linquistic     Dysphagia Treatment   Temperature Spikes Noted No   Respiratory Status Room air   Oral Cavity - Dentition Dentures, top;Dentures, bottom   Treatment Methods Skilled observation;Patient/caregiver education   Patient observed directly with PO's Yes   Type of PO's observed Thin liquids   Feeding Able to feed self   Liquids provided via Cup   Amount of cueing Independent   Other  treatment/comments --  no overt coughing however recent PNA and SOB per ED visit     Pain Assessment   Pain Assessment No/denies pain     Cognitive-Linquistic Treatment   Treatment focused on Dysarthria;Patient/family/caregiver education   Skilled Treatment breath support exercises, speech intelligibility strategies, pt feedback and self evaluation     Assessment / Recommendations / Plan   Plan Continue with current plan of care;MBS  faxed request to Dr. Cindie Laroche to complete MBSS     Dysphagia Recommendations   Diet recommendations Regular;Thin liquid     Progression Toward Goals   Progression toward goals Progressing toward goals           SLP Short Term Goals - 03/10/17 1930      SLP SHORT TERM GOAL #1   Title Pt will increase conversation level speech intelligibility to 80% by utilizing appropriate strategies and listener needing repetition no more than 3x in a given conversation.   Baseline Clarification needed up to 5 times in a given conversation of 60% intelligible across 5 min.    Time 4   Period Weeks   Status On-going     SLP SHORT TERM GOAL #2   Title Pt will complete functional memory activities (mod level) with 90% accuracy when given min/mod cues and use of memory strategies.   Baseline 3/5 recall of words without cues   Time 4   Period Weeks   Status On-going     SLP SHORT TERM GOAL #3   Title Pt will complete designated oral motor tasks 3x daily with 10 repetitions each after introduced by SLP with use of written cue and given a daily log.    Baseline Pt reports continued practice of pursed lips and smile after being given exercises in the hospital.    Time 4   Period Weeks   Status On-going     SLP SHORT TERM GOAL #4   Title Pt will increase speech intelligibility to 90% accuracy when utilizing multisyllabic words in sentences given min/mod cues from clinician.   Baseline 75% intelligible given sentences to repeat   Time 4   Period Weeks   Status  On-going     SLP SHORT TERM GOAL #5   Title New Goal 03/10/2017: Complete MBSS          SLP Long Term Goals - 03/10/17 1931      SLP LONG TERM GOAL #1   Title same as short term goals          Plan - 03/10/17 1930    Clinical Impression Statement Today was [name] first SLP treatment session following SLP evaluation on 02/17/2017 due to scheduling conflicts. Pt was in the ED at the end of March for shortness of breath  and chest x-ray showed slight pneumonia. Pt does have dysphagia from his recent stroke, however objective assessment not completed. Pt denies difficulty swallowing, however he does have right sided facial weakness and dysarthria. Pt observed with thin water and coffee in session and no overt signs of distress. Recommend objective assessment via MBSS due to risk factors- request sent to PCP, Dr. Cindie Laroche this date and hope to complete on Thursday. In session, SLP reiterated speech intelligibility strategies and breath support strategies. He required mod/max cues for diaphragmatic breathing. He appears to be holding his breath at times when vocalizing. SLP demonstrated lip trill to help decrease vocal tension which pt was able to replicate. Continue POC and hope to complete MBSS next session pending MD approval.   Speech Therapy Frequency 2x / week   Duration 4 weeks   Treatment/Interventions Language facilitation;Compensatory techniques;Cueing hierarchy;Cognitive reorganization;Internal/external aids;Oral motor exercises;Functional tasks;Multimodal communcation approach;SLP instruction and feedback;Compensatory strategies;Patient/family education   Potential to Achieve Goals Good   SLP Home Exercise Plan Continued oral motor stretches and visual reminders for compensatory strategies   Consulted and Agree with Plan of Care Patient;Family member/caregiver   Family Member Consulted Spouse      Patient will benefit from skilled therapeutic intervention in order to improve the  following deficits and impairments:   Dysarthria following other cerebrovascular disease  Cognitive communication deficit  Dysphagia, oropharyngeal phase    Problem List Patient Active Problem List   Diagnosis Date Noted  . Acute CVA (cerebrovascular accident) ([place]) 02/01/2017  . Diabetes mellitus ([place]) 02/01/2017  . HTN (hypertension) 02/01/2017  . Primary osteoarthritis of left hip 01/08/2016  . DJD (degenerative joint disease) 03/20/2015  . Primary osteoarthritis of right hip 02/26/2015  . Lumbar spondylosis 10/17/2014  . Lumbago 07/25/2014  . Abnormality of gait 07/25/2014  . Difficulty in walking(719.7) 07/25/2014  . TIA (transient ischemic attack) 03/09/2013  . Cocaine abuse 03/09/2013  . Accelerated hypertension 03/09/2013  . Hyperlipidemia 03/09/2013  . Current smoker 03/09/2013   Thank you,  Genene Churn, [place]  [facility] 03/10/2017, 7:32 PM  [place] [facility] [address] [place], [place], [address] Phone: [phone]   Fax:  [phone]   Name: SHAQUILE LUTZE MRN: [id number] Date of Birth: 1961/10/29

## 2017-03-12 ENCOUNTER — Ambulatory Visit (HOSPITAL_COMMUNITY): Payer: Medicaid Other | Admitting: Physical Therapy

## 2017-03-12 ENCOUNTER — Ambulatory Visit (HOSPITAL_COMMUNITY): Payer: Medicaid Other | Admitting: Speech Pathology

## 2017-03-12 ENCOUNTER — Encounter (HOSPITAL_COMMUNITY): Payer: Self-pay | Admitting: Speech Pathology

## 2017-03-12 ENCOUNTER — Ambulatory Visit (HOSPITAL_COMMUNITY)
Admission: RE | Admit: 2017-03-12 | Discharge: 2017-03-12 | Disposition: A | Discharge status: Home / Self Care | Payer: Medicaid Other | Source: Ambulatory Visit | Attending: Family Medicine | Admitting: Family Medicine

## 2017-03-12 ENCOUNTER — Other Ambulatory Visit (HOSPITAL_COMMUNITY): Payer: Self-pay | Admitting: Specialist

## 2017-03-12 DIAGNOSIS — R1319 Other dysphagia: Secondary | ICD-10-CM

## 2017-03-12 DIAGNOSIS — R2681 Unsteadiness on feet: Secondary | ICD-10-CM

## 2017-03-12 DIAGNOSIS — R2689 Other abnormalities of gait and mobility: Secondary | ICD-10-CM

## 2017-03-12 DIAGNOSIS — R262 Difficulty in walking, not elsewhere classified: Secondary | ICD-10-CM | POA: Diagnosis not present

## 2017-03-12 DIAGNOSIS — R1312 Dysphagia, oropharyngeal phase: Secondary | ICD-10-CM

## 2017-03-12 DIAGNOSIS — M6281 Muscle weakness (generalized): Secondary | ICD-10-CM

## 2017-03-12 NOTE — Therapy (Signed)
[place], [place], [address] Phone: [phone]   Fax:  [phone]  Physical Therapy Treatment  Patient Details  Name: Raymond Barrera MRN: [id number] Date of Birth: 09-16-1961 Referring Provider: Lucia Gaskins   Encounter Date: 03/12/2017      PT End of Session - 03/12/17 1813    Visit Number 6   Number of Visits 9   Date for PT Re-Evaluation 03/17/17   Authorization Type Medicaid (8 through 4/17 )   Authorization Time Period 02/17/17 to 03/20/17   PT Start Time 1656  Did not include 8 minutes of Nustep in billing    PT Stop Time 1737   PT Time Calculation (min) 41 min   Equipment Utilized During Treatment Gait belt   Activity Tolerance Patient tolerated treatment well   Behavior During Therapy [facility] for tasks assessed/performed      Past Medical History:  Diagnosis Date  . Anxiety    takes Valium daily as needed  . Arthritis   . Back pain    hx of buldging disc  . Diabetes mellitus without complication ([place])    takes Metformin daily  . Headache    migraines  . High cholesterol    takes Zocor daily  . Hypertension    takes Benazepril and HCTZ  daily  . Joint pain   . Joint swelling   . Memory impairment    occassional - from stroke  . Myocardial infarction 1987  . Pneumonia    hx of-80's  . PONV (postoperative nausea and vomiting)    took a while for him to wake up after previous anesthesia  . Shortness of breath dyspnea    with exertion  . Slurred speech   . Stroke ([place]) 08/2013  . TIA (transient ischemic attack) 2014   x 7   . Urinary frequency     Past Surgical History:  Procedure Laterality Date  . ANKLE SURGERY  2008   left ankle-otif-[facility]  . BACK SURGERY    . JOINT REPLACEMENT     both hips replaced   . LUMBAR LAMINECTOMY/DECOMPRESSION MICRODISCECTOMY Right 10/17/2014   Procedure: LUMBAR LAMINECTOMY/DECOMPRESSION MICRODISCECTOMY 2 LEVELS;  Surgeon: [name], MD;   Location: [place];  Service: Neurosurgery;  Laterality: Right;  Right L45 L5S1 laminectomy and foraminotomy  . MASS EXCISION  09/13/2012   Procedure: EXCISION MASS;  Surgeon: Jamesetta So, MD;  [facility];  Service: General;  Laterality: N/A;  . TONSILLECTOMY    . TOTAL HIP ARTHROPLASTY Right 03/20/2015  . TOTAL HIP ARTHROPLASTY Right 03/20/2015   Procedure: TOTAL HIP ARTHROPLASTY ANTERIOR APPROACH;  Surgeon: Renette Butters, MD;  Location: [facility];  Service: Orthopedics;  Laterality: Right;  . TOTAL HIP ARTHROPLASTY Left 01/08/2016   Procedure: TOTAL HIP ARTHROPLASTY ANTERIOR APPROACH;  Surgeon: Renette Butters, MD;  Location: [facility];  Service: Orthopedics;  Laterality: Left;    There were no vitals filed for this visit.      Subjective Assessment - 03/12/17 1806    Subjective Patient arrives today after doing a swallow test at the hospital, reports he thinks it went well. His wife is present and reports that a big concern is balance as he always loses his balance/almost falls in the mornings    Pertinent History TIAx x7, HTN, CVA, DM, lumbar spondylosis, B hip replacements in 2016 and 2017, history of cocaine abuse, current smoker, ankle surgery L, lumbar laminectomy/discectomy    Patient  Stated Goals return to PLOF    Currently in Pain? No/denies                         [facility] Adult PT Treatment/Exercise - 03/12/17 0001      Knee/Hip Exercises: Aerobic   Nustep Level 4; hills 4, 8 minutes   not included in billing              Balance Exercises - 03/12/17 1809      Balance Exercises: Standing   Stepping Strategy Anterior;Posterior;5 reps   Heel Raises Limitations x10 on foam    Toe Raise Limitations x10 on foam            PT Education - 03/12/17 1810    Education provided Yes   Education Details spent a considerable amount of time this session discussing safety factors such as not getting an energetic puppy due fall risk- mature, slow  moving dog would be safer and reduce fall risk with a pet in the home; problem solving for loss of balance in teh mornings/as he is getting up at home; encouraged keeping cane and/or walker in the bedroom for safety, use of bed rail, use of walker partially under mattress as temporary bed rail; importance of regular walking in his apt instead of being sedentary/laying down all day     Person(s) Educated Patient;Spouse   Methods Explanation   Comprehension Verbalized understanding          PT Short Term Goals - 03/02/17 1740      PT SHORT TERM GOAL #1   Title Patient and spouse to be able to identify 5/5 safety factors in order to reduce fall risk and improve general safety    Time 2   Period Weeks   Status New     PT SHORT TERM GOAL #2   Title Patient to be able to ambulate at least 576ft with LRAD during 3MWT in order to show improved mobility and home/community access    Time 2   Period Weeks   Status New     PT SHORT TERM GOAL #3   Title Patient to be able to complete TUG with LRAD in 15 seconds or less in order to show increased mobilty and reduced fall risk    Time 2   Period Weeks   Status New     PT SHORT TERM GOAL #4   Title Patient to be independent in correctly and consistently performing targeted HEP, to be updated as appropriate    Time 1   Period Weeks   Status New           PT Long Term Goals - 03/02/17 1740      PT LONG TERM GOAL #1   Title Patient to demonstrate functional strength at least 4/5 in order to improve gait and overall mobility    Time 4   Period Weeks   Status New     PT LONG TERM GOAL #2   Title Patient to be able to ambulate unlimited distances, untimed, with LRAD in order to show improved mobility and functional activity tolerance    Time 4   Period Weeks   Status New     PT LONG TERM GOAL #3   Title Patient to be able to independently dress and perform self care based tasks in unsupported standing with no LOB or concern for fall  in order to improve general function and QOL  Time 4   Period Weeks   Status New     PT LONG TERM GOAL #4   Title Patient to be participatory in regular exercise program and educated regarding the benefit of smoking cessation in order to assist in general prevention of another TIA or CVA based incident    Time 4   Period Weeks   Status New               Plan - 03/12/17 1814    Clinical Impression Statement Patient arrives today with his spouse, and both state that strength but especially balance continues to be a large concern for them and they would like to focus on this during his remaining Medicaid backed sessions. Progressed settings on Nustep (not included in billing) as this appears to be the best tool to work on leg strength without aggravating/increasing hip pain. Otherwise performed some balance activities in parallel bars but actually spent a considerable amount of time during session educating patient and spouse (see section for details). Recommend continued focus on Nustep for strength; also work, balance, and safety moving forward.    Rehab Potential Fair   Clinical Impairments Affecting Rehab Potential (+) very supportive spouse, somewhat success with PT in the past; (-) history of cocaine abuse, multiple TIAs, severe hip and lumbar pain, possible insurance limitations    PT Frequency 2x / week   PT Duration 4 weeks   PT Treatment/Interventions ADLs/Self Care Home Management;DME Instruction;Gait training;Stair training;Functional mobility training;Therapeutic activities;Therapeutic exercise;Balance training;Neuromuscular re-education;Patient/family education;Manual techniques;Energy conservation   PT Next Visit Plan focus on getting LE strength through Nustep as this seems to be the best way to strengthen without aggravating hip pain; focus on balance safety otherwise.    PT Home Exercise Plan 03/30: Bridge, supine abduction and STS.     Consulted and Agree with Plan of  Care Patient   Family Member Consulted Spouse       Patient will benefit from skilled therapeutic intervention in order to improve the following deficits and impairments:  Abnormal gait, Pain, Decreased coordination, Decreased mobility, Decreased activity tolerance, Decreased strength, Decreased balance, Decreased safety awareness, Difficulty walking  Visit Diagnosis: Difficulty in walking, not elsewhere classified  Unsteadiness on feet  Other abnormalities of gait and mobility  Muscle weakness (generalized)     Problem List Patient Active Problem List   Diagnosis Date Noted  . Acute CVA (cerebrovascular accident) ([place]) 02/01/2017  . Diabetes mellitus ([place]) 02/01/2017  . HTN (hypertension) 02/01/2017  . Primary osteoarthritis of left hip 01/08/2016  . DJD (degenerative joint disease) 03/20/2015  . Primary osteoarthritis of right hip 02/26/2015  . Lumbar spondylosis 10/17/2014  . Lumbago 07/25/2014  . Abnormality of gait 07/25/2014  . Difficulty in walking(719.7) 07/25/2014  . TIA (transient ischemic attack) 03/09/2013  . Cocaine abuse 03/09/2013  . Accelerated hypertension 03/09/2013  . Hyperlipidemia 03/09/2013  . Current smoker 03/09/2013    Deniece Ree PT, DPT [place] [address] [place], [place], [address] Phone: [phone]   Fax:  [phone]  Name: Raymond Barrera MRN: [id number] Date of Birth: [birth year]

## 2017-03-12 NOTE — Therapy (Signed)
[place], [place], [address] Phone: [phone]   Fax:  [phone]  Modified Barium Swallow  Patient Details  Name: Raymond Barrera MRN: [id number] Date of Birth: 1961-10-28 Referring Provider: Lucia Gaskins, MD  Encounter Date: 03/12/2017      End of Session - 03/12/17 1924    Visit Number 1   Number of Visits 1   Authorization Type Medicaid   SLP Start Time 1602   SLP Stop Time  1642   SLP Time Calculation (min) 40 min   Activity Tolerance Patient tolerated treatment well      Past Medical History:  Diagnosis Date  . Anxiety    takes Valium daily as needed  . Arthritis   . Back pain    hx of buldging disc  . Diabetes mellitus without complication ([place])    takes Metformin daily  . Headache    migraines  . High cholesterol    takes Zocor daily  . Hypertension    takes Benazepril and HCTZ  daily  . Joint pain   . Joint swelling   . Memory impairment    occassional - from stroke  . Myocardial infarction 1987  . Pneumonia    hx of-80's  . PONV (postoperative nausea and vomiting)    took a while for him to wake up after previous anesthesia  . Shortness of breath dyspnea    with exertion  . Slurred speech   . Stroke ([place]) 08/2013  . TIA (transient ischemic attack) 2014   x 7   . Urinary frequency     Past Surgical History:  Procedure Laterality Date  . ANKLE SURGERY  2008   left ankle-otif-[facility]  . BACK SURGERY    . JOINT REPLACEMENT     both hips replaced   . LUMBAR LAMINECTOMY/DECOMPRESSION MICRODISCECTOMY Right 10/17/2014   Procedure: LUMBAR LAMINECTOMY/DECOMPRESSION MICRODISCECTOMY 2 LEVELS;  Surgeon: [name], MD;  Location: [facility];  Service: Neurosurgery;  Laterality: Right;  Right L45 L5S1 laminectomy and foraminotomy  . MASS EXCISION  09/13/2012   Procedure: EXCISION MASS;  Surgeon: Jamesetta So, MD;  [facility];  Service: General;  Laterality: N/A;  .  TONSILLECTOMY    . TOTAL HIP ARTHROPLASTY Right 03/20/2015  . TOTAL HIP ARTHROPLASTY Right 03/20/2015   Procedure: TOTAL HIP ARTHROPLASTY ANTERIOR APPROACH;  Surgeon: Renette Butters, MD;  Location: [facility];  Service: Orthopedics;  Laterality: Right;  . TOTAL HIP ARTHROPLASTY Left 01/08/2016   Procedure: TOTAL HIP ARTHROPLASTY ANTERIOR APPROACH;  Surgeon: Renette Butters, MD;  Location: [facility];  Service: Orthopedics;  Laterality: Left;    There were no vitals filed for this visit.      Subjective Assessment - 03/12/17 1906    Subjective "I was trying to eat beans this morning and they just wouldn't go down."   Patient is accompained by: Family member   Special Tests MBSS   Currently in Pain? No/denies             General - 03/12/17 1916      General Information   Date of Onset 02/01/17   HPI Raymond Barrera is a 56 y.o. male. Patient was brought by his wife to [facility] ED on 02/01/2017 for complaints of right-sided weakness with symptoms starting Friday evening. Saturday late afternoon he developed some dysarthria with right facial droop. When he woke up on Sunday he started to notice right-sided weakness prompting him to come  to ED. In ED workup significant for subacute CVA with no evidence of hemorrhage. MRI showed acute infarct in the left putamen and internal capsule posteriorly. Pt attending outpatient SLP therapy and during session on 03/10/2017, Pt reported increased SOB and difficulty swallowing so MBSS ordered. Pt went to ED on 02/24/2017 for SOB and was diagnosed with possible PNA. Pt accompanied to today's appointment by his wife.    Type of Study MBS-Modified Barium Swallow Study   Previous Swallow Assessment BSE in acute setting last month   Diet Prior to this Study Regular;Thin liquids   Temperature Spikes Noted No   Respiratory Status Room air   History of Recent Intubation No   Behavior/Cognition Alert;Cooperative;Pleasant mood   Oral Cavity Assessment Within Functional Limits    Oral Care Completed by SLP No   Oral Cavity - Dentition Dentures, top;Dentures, bottom   Vision Functional for self feeding   Self-Feeding Abilities Able to feed self   Patient Positioning Upright in chair   Baseline Vocal Quality Normal   Volitional Cough Strong   Volitional Swallow Able to elicit   Anatomy Within functional limits  cervical osteophytes noted   Pharyngeal Secretions Not observed secondary MBS            Oral Preparation/Oral Phase - 03/12/17 1917      Oral Preparation/Oral Phase   Oral Phase Within functional limits  mildly prolonged oral prep     Electrical stimulation - Oral Phase   Was Electrical Stimulation Used No          Pharyngeal Phase - 03/12/17 1918      Pharyngeal Phase   Pharyngeal Phase Impaired     Pharyngeal - Thin   Pharyngeal- Thin Cup Delayed swallow initiation;Swallow initiation at pyriform sinus;Pharyngeal residue - valleculae;Other (Comment)  residuals clear with repeat swallow   Pharyngeal- Thin Straw Delayed swallow initiation;Swallow initiation at pyriform sinus;Pharyngeal residue - valleculae     Pharyngeal - Solids   Pharyngeal- Puree Within functional limits   Pharyngeal- Regular Within functional limits   Pharyngeal- Multi-consistency Within functional limits   Pharyngeal- Pill Penetration/Aspiration during swallow   Pharyngeal Material enters airway, remains ABOVE vocal cords then ejected out     Electrical Stimulation - Pharyngeal Phase   Was Electrical Stimulation Used No          Cricopharyngeal Phase - 03/12/17 1919      Cervical Esophageal Phase   Cervical Esophageal Phase Within functional limits  some backflow noted in distal esophagus during sweep           Plan - 03/12/17 1925    Clinical Impression Statement Mild oropharyngeal phase dysphagia characterized by mildly prolonged oral phase with solids due to suspected lingual weakness, premature spillage with delay in swallow initiation with  swallow trigger after filling the pyriforms with liquids, adequate hyolaryngeal excursion and epiglottic deflection resulting in penetration of thins during the swallow x1 without aspiration when taking barium tablet and min base of tongue/lateral channel/pyriform residue post swallow on thins which cleared with repeat/dry swallow. Esophageal sweep revealed  some retrograde movement of thin liquids in distal esophagus with eventual clearing. Recommend regular textures and thin liquids with aspiration and reflux precautions. Pt should also swallow 2x for each sip of liquid to ensure pharyngeal clearance. Pt does report difficulty initiating a dry swallow promptly. Treatment will focus on cued swallows and lingual strengthening. If patient continues to complain of difficulty swallowing solids, SOB, and inability to belch, he may want to  seek GI follow up. SLP will continue to follow in outpatient setting.    Potential to Achieve Goals Good   Consulted and Agree with Plan of Care Patient;Family member/caregiver   Family Member Consulted Spouse      Patient will benefit from skilled therapeutic intervention in order to improve the following deficits and impairments:   Dysphagia, oropharyngeal phase        Recommendations/Treatment - 03/12/17 1921      Swallow Evaluation Recommendations   Recommended Consults Consider GI evaluation  if symptoms persist   SLP Diet Recommendations Age appropriate regular;Thin   Liquid Administration via Spoon;Straw   Medication Administration Whole meds with liquid   Supervision Patient able to self feed   Compensations Multiple dry swallows after each bite/sip   Postural Changes Seated upright at 90 degrees;Remain upright for at least 30 minutes after feeds/meals          Prognosis - 03/12/17 1923      Prognosis   Prognosis for Safe Diet Advancement Good     Individuals Consulted   Consulted and Agree with Results and Recommendations Patient;Family  member/caregiver   Family Member Consulted Spouse   Report Sent to  Referring physician      Problem List Patient Active Problem List   Diagnosis Date Noted  . Acute CVA (cerebrovascular accident) ([place]) 02/01/2017  . Diabetes mellitus ([place]) 02/01/2017  . HTN (hypertension) 02/01/2017  . Primary osteoarthritis of left hip 01/08/2016  . DJD (degenerative joint disease) 03/20/2015  . Primary osteoarthritis of right hip 02/26/2015  . Lumbar spondylosis 10/17/2014  . Lumbago 07/25/2014  . Abnormality of gait 07/25/2014  . Difficulty in walking(719.7) 07/25/2014  . TIA (transient ischemic attack) 03/09/2013  . Cocaine abuse 03/09/2013  . Accelerated hypertension 03/09/2013  . Hyperlipidemia 03/09/2013  . Current smoker 03/09/2013   Thank you,  Genene Churn, [place]  [facility] 03/12/2017, 7:35 PM  [place] [address] [place], [place], [address] Phone: [phone]   Fax:  [phone]  Name: Raymond Barrera MRN: [id number] Date of Birth: Nov 11, 1961

## 2017-03-17 ENCOUNTER — Telehealth (HOSPITAL_COMMUNITY): Payer: Self-pay | Admitting: Occupational Therapy

## 2017-03-17 ENCOUNTER — Ambulatory Visit (HOSPITAL_COMMUNITY): Payer: Medicaid Other | Admitting: Physical Therapy

## 2017-03-17 ENCOUNTER — Ambulatory Visit (HOSPITAL_COMMUNITY): Payer: Medicaid Other | Admitting: Occupational Therapy

## 2017-03-17 NOTE — Telephone Encounter (Signed)
Pt is not feeling well today and can not come in

## 2017-03-19 ENCOUNTER — Encounter (HOSPITAL_COMMUNITY): Payer: Self-pay | Admitting: Occupational Therapy

## 2017-03-19 ENCOUNTER — Ambulatory Visit (HOSPITAL_COMMUNITY): Payer: Self-pay | Admitting: Physical Therapy

## 2017-03-24 ENCOUNTER — Ambulatory Visit (HOSPITAL_COMMUNITY): Payer: Medicaid Other | Admitting: Speech Pathology

## 2017-03-24 ENCOUNTER — Ambulatory Visit (HOSPITAL_COMMUNITY): Payer: Medicaid Other | Admitting: Occupational Therapy

## 2017-03-24 ENCOUNTER — Ambulatory Visit (HOSPITAL_COMMUNITY): Payer: Medicaid Other | Admitting: Physical Therapy

## 2017-03-24 ENCOUNTER — Telehealth (HOSPITAL_COMMUNITY): Payer: Self-pay | Admitting: Family Medicine

## 2017-03-24 NOTE — Telephone Encounter (Signed)
03/24/17 wife cx because she said she wasn't feeling good and needed him there to help take care of her

## 2017-03-26 ENCOUNTER — Ambulatory Visit (HOSPITAL_COMMUNITY): Payer: Medicaid Other | Admitting: Speech Pathology

## 2017-03-26 ENCOUNTER — Encounter (HOSPITAL_COMMUNITY): Payer: Self-pay | Admitting: Speech Pathology

## 2017-03-26 ENCOUNTER — Ambulatory Visit (HOSPITAL_COMMUNITY): Payer: Medicaid Other | Admitting: Occupational Therapy

## 2017-03-26 ENCOUNTER — Ambulatory Visit (HOSPITAL_COMMUNITY): Payer: Medicaid Other

## 2017-03-26 DIAGNOSIS — R1312 Dysphagia, oropharyngeal phase: Secondary | ICD-10-CM

## 2017-03-26 DIAGNOSIS — M6281 Muscle weakness (generalized): Secondary | ICD-10-CM

## 2017-03-26 DIAGNOSIS — R278 Other lack of coordination: Secondary | ICD-10-CM

## 2017-03-26 DIAGNOSIS — R262 Difficulty in walking, not elsewhere classified: Secondary | ICD-10-CM | POA: Diagnosis not present

## 2017-03-26 DIAGNOSIS — I69822 Dysarthria following other cerebrovascular disease: Secondary | ICD-10-CM

## 2017-03-26 DIAGNOSIS — R41841 Cognitive communication deficit: Secondary | ICD-10-CM

## 2017-03-26 DIAGNOSIS — R29898 Other symptoms and signs involving the musculoskeletal system: Secondary | ICD-10-CM

## 2017-03-26 DIAGNOSIS — R2689 Other abnormalities of gait and mobility: Secondary | ICD-10-CM

## 2017-03-26 DIAGNOSIS — R2681 Unsteadiness on feet: Secondary | ICD-10-CM

## 2017-03-26 NOTE — Therapy (Signed)
[place], [place], [address] Phone: [phone]   Fax:  [phone]  Speech Language Pathology Treatment  Patient Details  Name: Raymond Barrera MRN: [id number] Date of Birth: 03/03/1961 Referring Provider: Lucia Gaskins, MD  Encounter Date: 03/26/2017      End of Session - 03/26/17 1659    Visit Number 3   Number of Visits 8   Authorization Type Medicaid   Authorization Time Period 03/24/2017-04/06/2017   Authorization - Visit Number 1   Authorization - Number of Visits 4   SLP Start Time 1610   SLP Stop Time  1650   SLP Time Calculation (min) 40 min   Activity Tolerance Patient tolerated treatment well      Past Medical History:  Diagnosis Date  . Anxiety    takes Valium daily as needed  . Arthritis   . Back pain    hx of buldging disc  . Diabetes mellitus without complication ([place])    takes Metformin daily  . Headache    migraines  . High cholesterol    takes Zocor daily  . Hypertension    takes Benazepril and HCTZ  daily  . Joint pain   . Joint swelling   . Memory impairment    occassional - from stroke  . Myocardial infarction ([place]) 1987  . Pneumonia    hx of-80's  . PONV (postoperative nausea and vomiting)    took a while for him to wake up after previous anesthesia  . Shortness of breath dyspnea    with exertion  . Slurred speech   . Stroke ([place]) 08/2013  . TIA (transient ischemic attack) 2014   x 7   . Urinary frequency     Past Surgical History:  Procedure Laterality Date  . ANKLE SURGERY  2008   left ankle-otif-[facility]  . BACK SURGERY    . JOINT REPLACEMENT     both hips replaced   . LUMBAR LAMINECTOMY/DECOMPRESSION MICRODISCECTOMY Right 10/17/2014   Procedure: LUMBAR LAMINECTOMY/DECOMPRESSION MICRODISCECTOMY 2 LEVELS;  Surgeon: [name], MD;  Location: [facility];  Service: Neurosurgery;  Laterality: Right;  Right L45 L5S1 laminectomy and foraminotomy  . MASS  EXCISION  09/13/2012   Procedure: EXCISION MASS;  Surgeon: Jamesetta So, MD;  Location: [facility];  Service: General;  Laterality: N/A;  . TONSILLECTOMY    . TOTAL HIP ARTHROPLASTY Right 03/20/2015  . TOTAL HIP ARTHROPLASTY Right 03/20/2015   Procedure: TOTAL HIP ARTHROPLASTY ANTERIOR APPROACH;  Surgeon: Renette Butters, MD;  Location: [facility];  Service: Orthopedics;  Laterality: Right;  . TOTAL HIP ARTHROPLASTY Left 01/08/2016   Procedure: TOTAL HIP ARTHROPLASTY ANTERIOR APPROACH;  Surgeon: Renette Butters, MD;  Location: [facility];  Service: Orthopedics;  Laterality: Left;    There were no vitals filed for this visit.      Subjective Assessment - 03/26/17 1651    Subjective "Sometimes after the third bite, things won't go down."   Patient is accompained by: Family member         ADULT SLP TREATMENT - 03/26/17 0001      General Information   Behavior/Cognition Alert;Cooperative;Pleasant mood   Patient Positioning Upright in chair   Oral care provided N/A   HPI Raymond Barrera  is a 56 y.o. male. Patient was brought by his wife for complaints of right-sided weakness with symptoms starting Friday evening. Saturday late afternoon he developed some dysarthria with right facial droop. When he woke  up on Sunday he started to notice right-sided weakness prompting him to come to ED. In ED workup significant for subacute CVA with no evidence of hemorrhage. MRI showed acute infarct in the left putamen and internal capsule posteriorly.     Treatment Provided   Treatment provided Cognitive-Linquistic     Dysphagia Treatment   Temperature Spikes Noted No     Pain Assessment   Pain Assessment No/denies pain     Cognitive-Linquistic Treatment   Treatment focused on Dysarthria;Patient/family/caregiver education;Voice   Skilled Treatment breath support exercises, speech intelligibility strategies, pt feedback and self evaluation     Assessment / Recommendations / Plan   Plan Continue with current  plan of care            SLP Short Term Goals - 03/26/17 1701      SLP SHORT TERM GOAL #1   Title Pt will increase conversation level speech intelligibility to 80% by utilizing appropriate strategies and listener needing repetition no more than 3x in a given conversation.   Baseline Clarification needed up to 5 times in a given conversation of 60% intelligible across 5 min.    Time 4   Period Weeks   Status On-going     SLP SHORT TERM GOAL #2   Title Pt will complete functional memory activities (mod level) with 90% accuracy when given min/mod cues and use of memory strategies.   Baseline 3/5 recall of words without cues   Time 4   Period Weeks   Status On-going     SLP SHORT TERM GOAL #3   Title Pt will complete designated oral motor tasks 3x daily with 10 repetitions each after introduced by SLP with use of written cue and given a daily log.    Baseline Pt reports continued practice of pursed lips and smile after being given exercises in the hospital.    Time 4   Period Weeks   Status On-going     SLP SHORT TERM GOAL #4   Title Pt will increase speech intelligibility to 90% accuracy when utilizing multisyllabic words in sentences given min/mod cues from clinician.   Baseline 75% intelligible given sentences to repeat   Time 4   Period Weeks   Status On-going     SLP SHORT TERM GOAL #5   Title New Goal 03/10/2017: Complete MBSS          SLP Long Term Goals - 03/26/17 1701      SLP LONG TERM GOAL #1   Title same as short term goals          Plan - 03/26/17 1701    Clinical Impression Statement Pt had MBSS last session which showed: Mild oropharyngeal phase dysphagia characterized by mildly prolonged oral phase with solids due to suspected lingual weakness, premature spillage with delay in swallow initiation with swallow trigger after filling the pyriforms with liquids, adequate hyolaryngeal excursion and epiglottic deflection resulting in penetration of thins  during the swallow x1 without aspiration when taking barium tablet and min base of tongue/lateral channel/pyriform residue post swallow on thins which cleared with repeat/dry swallow. Esophageal sweep revealed  some retrograde movement of thin liquids in distal esophagus with eventual clearing. Recommend regular textures and thin liquids with aspiration and reflux precautions. Pt should also swallow 2x for each sip of liquid to ensure pharyngeal clearance. Pt does report difficulty initiating a dry swallow promptly. Treatment will focus on cued swallows and lingual strengthening. If patient continues to complain of difficulty  swallowing solids, SOB, and inability to belch, he may want to seek GI follow up.  Pt reports occasional difficulty swallowing solids since MBSS characterized by some coughing. MBSS reviewed with Pt. Lingual strengthening exercises completed and SLP provided resistance. He reports feeling bothered by his speech intelligibility and SLP reiterated speech intelligibility strategies and breath support strategies. He required mod/max cues for diaphragmatic breathing. He continues to have difficulty coordinating respiration and voicing (holding his breath at times when vocalizing). SLP demonstrated lip trill to help decrease vocal tension which pt was able to replicate. Continue POC.   Speech Therapy Frequency 2x / week   Duration 4 weeks   Treatment/Interventions Language facilitation;Compensatory techniques;Cueing hierarchy;Cognitive reorganization;Internal/external aids;Oral motor exercises;Functional tasks;Multimodal communcation approach;SLP instruction and feedback;Compensatory strategies;Patient/family education   Potential to Achieve Goals Good   SLP Home Exercise Plan Continued oral motor stretches and visual reminders for compensatory strategies   Consulted and Agree with Plan of Care Patient;Family member/caregiver   Family Member Consulted Spouse      Patient will benefit  from skilled therapeutic intervention in order to improve the following deficits and impairments:   Dysarthria following other cerebrovascular disease  Cognitive communication deficit  Dysphagia, oropharyngeal phase    Problem List Patient Active Problem List   Diagnosis Date Noted  . Acute CVA (cerebrovascular accident) ([employer]) 02/01/2017  . Diabetes mellitus ([place]) 02/01/2017  . HTN (hypertension) 02/01/2017  . Primary osteoarthritis of left hip 01/08/2016  . DJD (degenerative joint disease) 03/20/2015  . Primary osteoarthritis of right hip 02/26/2015  . Lumbar spondylosis 10/17/2014  . Lumbago 07/25/2014  . Abnormality of gait 07/25/2014  . Difficulty in walking(719.7) 07/25/2014  . TIA (transient ischemic attack) 03/09/2013  . Cocaine abuse 03/09/2013  . Accelerated hypertension 03/09/2013  . Hyperlipidemia 03/09/2013  . Current smoker 03/09/2013   Thank you,  Genene Churn, CCC-SLP [phone]  [facility] 03/26/2017, 5:03 PM  [place], [place], [address] Phone: [phone]   Fax:  [phone]   Name: Raymond Barrera MRN: [id number] Date of Birth: [birth year]

## 2017-03-26 NOTE — Therapy (Addendum)
[place], [place], [address] Phone: [phone]   Fax:  [phone]  Physical Therapy Treatment  Patient Details  Name: Raymond Barrera MRN: [id number] Date of Birth: 1961/07/28 Referring Provider: Lucia Gaskins  Encounter Date: 03/26/2017      PT End of Session - 03/26/17 1515    Visit Number 7   Number of Visits 9   Date for PT Re-Evaluation 03/17/17   Authorization Type Medicaid (8 through 4/17 )   Authorization Time Period 02/17/17 to 03/20/17   PT Start Time 1435   PT Stop Time 1514   PT Time Calculation (min) 39 min   Equipment Utilized During Treatment Gait belt   Activity Tolerance Patient tolerated treatment well   Behavior During Therapy [facility] for tasks assessed/performed      Past Medical History:  Diagnosis Date  . Anxiety    takes Valium daily as needed  . Arthritis   . Back pain    hx of buldging disc  . Diabetes mellitus without complication ([place])    takes Metformin daily  . Headache    migraines  . High cholesterol    takes Zocor daily  . Hypertension    takes Benazepril and HCTZ  daily  . Joint pain   . Joint swelling   . Memory impairment    occassional - from stroke  . Myocardial infarction ([place]) 1987  . Pneumonia    hx of-80's  . PONV (postoperative nausea and vomiting)    took a while for him to wake up after previous anesthesia  . Shortness of breath dyspnea    with exertion  . Slurred speech   . Stroke ([place]) 08/2013  . TIA (transient ischemic attack) 2014   x 7   . Urinary frequency     Past Surgical History:  Procedure Laterality Date  . ANKLE SURGERY  2008   left ankle-otif-[facility]  . BACK SURGERY    . JOINT REPLACEMENT     both hips replaced   . LUMBAR LAMINECTOMY/DECOMPRESSION MICRODISCECTOMY Right 10/17/2014   Procedure: LUMBAR LAMINECTOMY/DECOMPRESSION MICRODISCECTOMY 2 LEVELS;  Surgeon: [name], MD;  Location: [facility];  Service:  Neurosurgery;  Laterality: Right;  Right L45 L5S1 laminectomy and foraminotomy  . MASS EXCISION  09/13/2012   Procedure: EXCISION MASS;  Surgeon: Jamesetta So, MD;  [facility];  Service: General;  Laterality: N/A;  . TONSILLECTOMY    . TOTAL HIP ARTHROPLASTY Right 03/20/2015  . TOTAL HIP ARTHROPLASTY Right 03/20/2015   Procedure: TOTAL HIP ARTHROPLASTY ANTERIOR APPROACH;  Surgeon: Renette Butters, MD;  Location: [place] [facility];  Service: Orthopedics;  Laterality: Right;  . TOTAL HIP ARTHROPLASTY Left 01/08/2016   Procedure: TOTAL HIP ARTHROPLASTY ANTERIOR APPROACH;  Surgeon: Renette Butters, MD;  Location: [facility];  Service: Orthopedics;  Laterality: Left;    There were no vitals filed for this visit.      Subjective Assessment - 03/26/17 1440    Subjective Pt arrives today for reassessment visit. SInce starting with PT, he feels that his AMB and balance are equally as impaired as before. He expresses greater concern for his difficulty with RUE fine motor control and difficulty with speech (dysarthria).    Pertinent History TIAx x7, HTN, CVA, DM, lumbar spondylosis, B hip replacements in 2016 and 2017, history of cocaine abuse, current smoker, ankle surgery L, lumbar laminectomy/discectomy    Currently in Pain? Yes   Pain Score --  6-7/10  bila thips and low back             [facility] PT Assessment - 03/26/17 0001      Assessment   Medical Diagnosis CVA    Referring Provider Lucia Gaskins   Onset Date/Surgical Date 01/30/17   Prior Therapy acute PT     Precautions   Precautions Fall     Restrictions   Weight Bearing Restrictions No     Sensation   Light Touch Impaired Detail  Decreased L5, S2; (pt attributes to THA history)      Strength   Right Hip Flexion 5/5   Right Hip External Rotation  3+/5  Left lateral hip pain    Right Hip Internal Rotation 4/5   Right Hip ABduction 4+/5   Left Hip Flexion 2+/5  (3+ at eval)   Left Hip External Rotation 4/5   Left Hip Internal  Rotation 4-/5   Left Hip ABduction 4-/5  3-/5 at eval   Right Knee Flexion 5/5  (4+/5 at eval)    Right Knee Extension 5/5   Left Knee Flexion 4+/5   Left Knee Extension 5/5   Right Ankle Dorsiflexion 5/5   Left Ankle Dorsiflexion 5/5     Ambulation/Gait   Ambulation Distance (Feet) 675 Feet  682f in 202 seconds    Assistive device Straight cane   Gait velocity 1.044m  0.5760m@ evaluation visit (with RW)                      [facility] Adult PT Treatment/Exercise - 03/26/17 0001      Manual Therapy   Manual Therapy Myofascial release   Myofascial Release Left TFL x 3 minutes   decreases taut bands of tissue, decreased pain with palpati                  PT Short Term Goals - 03/26/17 1500      PT SHORT TERM GOAL #1   Title Patient and spouse to be able to identify 5/5 safety factors in order to reduce fall risk and improve general safety    Time 2   Period Weeks   Status Achieved     PT SHORT TERM GOAL #2   Title Patient to be able to ambulate at least 500f8fth LRAD during 3MWT in order to show improved mobility and home/community access    Time 2   Period Weeks   Status Achieved     PT SHORT TERM GOAL #3   Title Patient to be able to complete TUG with LRAD in 15 seconds or less in order to show increased mobilty and reduced fall risk    Time 2   Period Weeks   Status Achieved     PT SHORT TERM GOAL #4   Title Patient to be independent in correctly and consistently performing targeted HEP, to be updated as appropriate    Time 1   Period Weeks   Status Achieved           PT Long Term Goals - 03/26/17 1510      PT LONG TERM GOAL #1   Title Patient to demonstrate functional strength at least 4/5 in order to improve gait and overall mobility    Baseline LLE stil limited in some areas, but improved    Time 4   Period Weeks   Status Partially Met     PT LONG TERM GOAL #2   Title Patient to be  able to ambulate unlimited distances,  untimed, with LRAD in order to show improved mobility and functional activity tolerance    Baseline P tunable to    Time 4   Period Weeks   Status Not Met     PT LONG TERM GOAL #3   Title Patient to be able to independently dress and perform self care based tasks in unsupported standing with no LOB or concern for fall in order to improve general function and QOL .   Baseline Done independnetly    Status Achieved     PT LONG TERM GOAL #4   Title Patient to be participatory in regular exercise program and educated regarding the benefit of smoking cessation in order to assist in general prevention of another TIA or CVA based incident    Baseline  Reports of stopped smoking for 7+ weeks.   Time 4   Period Days   Status Partially Met               Plan - 03/26/17 1516    Clinical Impression Statement Pt reassessedtoday. Noted to have made some improvements, mot noted in gait and TUG (9.89s), but isolated strength testing in MMT is mor elimited. Pt now with pain at anterior hip with gait, improved somewha twith MFR to TFL. Discussed self care goign forward, continuing with walking program and potential involveemnt with supoprt group and pro-bono clinics in area. Pt is ready for DC at this time as he has exhausted his benefits, although he would clearly continue to benefit from OPPT services.    Clinical Impairments Affecting Rehab Potential (+) very supportive spouse, somewhat success with PT in the past; (-) history of cocaine abuse, multiple TIAs, severe hip and lumbar pain, possible insurance limitations    PT Frequency 2x / week   PT Duration 4 weeks   PT Treatment/Interventions ADLs/Self Care Home Management;DME Instruction;Gait training;Stair training;Functional mobility training;Therapeutic activities;Therapeutic exercise;Balance training;Neuromuscular re-education;Patient/family education;Manual techniques;Energy conservation   PT Home Exercise Plan 03/30: Bridge, supine abduction  and STS.     Consulted and Agree with Plan of Care Patient   Family Member Consulted Spouse       Patient will benefit from skilled therapeutic intervention in order to improve the following deficits and impairments:  Abnormal gait, Pain, Decreased coordination, Decreased mobility, Decreased activity tolerance, Decreased strength, Decreased balance, Decreased safety awareness, Difficulty walking  Visit Diagnosis: Difficulty in walking, not elsewhere classified  Unsteadiness on feet  Other abnormalities of gait and mobility  Muscle weakness (generalized)     Problem List Patient Active Problem List   Diagnosis Date Noted  . Acute CVA (cerebrovascular accident) ([place]) 02/01/2017  . Diabetes mellitus ([place]) 02/01/2017  . HTN (hypertension) 02/01/2017  . Primary osteoarthritis of left hip 01/08/2016  . DJD (degenerative joint disease) 03/20/2015  . Primary osteoarthritis of right hip 02/26/2015  . Lumbar spondylosis 10/17/2014  . Lumbago 07/25/2014  . Abnormality of gait 07/25/2014  . Difficulty in walking(719.7) 07/25/2014  . TIA (transient ischemic attack) 03/09/2013  . Cocaine abuse 03/09/2013  . Accelerated hypertension 03/09/2013  . Hyperlipidemia 03/09/2013  . Current smoker 03/09/2013   3:21 PM, 03/26/17 Etta Grandchild, PT, DPT Physical Therapist at [facility] [phone] (office)     [facility] [address] [place], [place], [address] Phone: [phone]   Fax:  [phone]  Name: CHASKE PASKETT MRN: [id number] Date of Birth: [birth year]   PHYSICAL THERAPY DISCHARGE SUMMARY  Visits from Start of Care: 7  Current functional level related to goals / functional outcomes: Patient has not returned since last visit. Goals partially met.    Remaining deficits: *see above   Education / Equipment: N/A  Plan:                                                    Patient goals were  partially met. Patient is being discharged due to not returning since the last visit.  ?????         3:15 PM, 12/23/17 Etta Grandchild, PT, DPT Physical Therapist - [facility] [phone] (mobile)

## 2017-03-26 NOTE — Therapy (Signed)
[facility], [place], [address] Phone: [phone]   Fax:  [phone]  Occupational Therapy Reassessment/Treatment  Patient Details  Name: Raymond Barrera MRN: [id number] Date of Birth: [birth year] Referring Provider: Dr. Cindie Laroche  Encounter Date: 03/26/2017      OT End of Session - 03/26/17 2118    Visit Number 3   Number of Visits 12   Date for OT Re-Evaluation 04/17/17   Authorization Type Medicaid   Authorization Time Period Approved 8 visits (02/23/17-03/22/17); Requesting extension   Authorization - Visit Number 1   Authorization - Number of Visits 8   OT Start Time 8099   OT Stop Time 1600   OT Time Calculation (min) 41 min   Activity Tolerance Patient tolerated treatment well   Behavior During Therapy WFL for tasks assessed/performed      Past Medical History:  Diagnosis Date  . Anxiety    takes Valium daily as needed  . Arthritis   . Back pain    hx of buldging disc  . Diabetes mellitus without complication ([place])    takes Metformin daily  . Headache    migraines  . High cholesterol    takes Zocor daily  . Hypertension    takes Benazepril and HCTZ  daily  . Joint pain   . Joint swelling   . Memory impairment    occassional - from stroke  . Myocardial infarction ([place]) 1987  . Pneumonia    hx of-80's  . PONV (postoperative nausea and vomiting)    took a while for him to wake up after previous anesthesia  . Shortness of breath dyspnea    with exertion  . Slurred speech   . Stroke ([place]) 08/2013  . TIA (transient ischemic attack) 2014   x 7   . Urinary frequency     Past Surgical History:  Procedure Laterality Date  . ANKLE SURGERY  2008   left ankle-otif-[facility]  . BACK SURGERY    . JOINT REPLACEMENT     both hips replaced   . LUMBAR LAMINECTOMY/DECOMPRESSION MICRODISCECTOMY Right 10/17/2014   Procedure: LUMBAR LAMINECTOMY/DECOMPRESSION MICRODISCECTOMY 2 LEVELS;  Surgeon: [name], MD;  Location: [facility];  Service: Neurosurgery;  Laterality: Right;  Right L45 L5S1 laminectomy and foraminotomy  . MASS EXCISION  09/13/2012   Procedure: EXCISION MASS;  Surgeon: Jamesetta So, MD;  [facility];  Service: General;  Laterality: N/A;  . TONSILLECTOMY    . TOTAL HIP ARTHROPLASTY Right 03/20/2015  . TOTAL HIP ARTHROPLASTY Right 03/20/2015   Procedure: TOTAL HIP ARTHROPLASTY ANTERIOR APPROACH;  Surgeon: Renette Butters, MD;  Location: [facility];  Service: Orthopedics;  Laterality: Right;  . TOTAL HIP ARTHROPLASTY Left 01/08/2016   Procedure: TOTAL HIP ARTHROPLASTY ANTERIOR APPROACH;  Surgeon: Renette Butters, MD;  Location: [facility];  Service: Orthopedics;  Laterality: Left;    There were no vitals filed for this visit.          [facility] OT Assessment - 03/26/17 1521      Assessment   Diagnosis Right Arm Weakness and Lack of Coordination S/P L CVA   Referring Provider Dr. Cindie Laroche     Precautions   Precautions Fall     Restrictions   Weight Bearing Restrictions No     Written Expression   Dominant Hand Right   Handwriting 75% legible     Coordination   Right 9 Hole Peg Test 28.22"  31.56"  Box and Blocks R: 52  previous 44     Strength   Strength Assessment Site Hand   Right/Left hand Right   Right Hand Grip (lbs) 113  125 previous   Right Hand Lateral Pinch 23 lbs  28 previous   Right Hand 3 Point Pinch 22 lbs  20 previous                  OT Treatments/Exercises (OP) - 03/26/17 1553      Exercises   Exercises Hand     Hand Exercises   Hand Gripper with Large Beads all beads gripper at 79#   Hand Gripper with Medium Beads 5/13 beads at 69^     Fine Motor Coordination   Fine Motor Coordination Grooved pegs   Dealing card with thumb Cards dealt with thumb with min difficulty until stack became smaller in which it was mod difficulty.   Grooved pegs Pt placed all pegs in palm of right hand and practiced palm to fingertip  translation to place into pegboard. Pt then picked up all pegs and held in palm. Min/mod difficulty.                   OT Short Term Goals - 03/26/17 1530      OT SHORT TERM GOAL #1   Title Patient will be educated on a HEP for improved [place] and [place] in right arm needed for return to PLOF.   Time 3   Period Weeks   Status On-going     OT SHORT TERM GOAL #2   Title Patient will improve GMC in RUE as demonstrated by a 50% improvement in rapid supination/pronation task.   Time 3   Period Weeks   Status Partially Met     OT SHORT TERM GOAL #3   Title Patient will improve fine motor coordination in his right hand for improved independence with manipulating tools when working on his car by improving completion speed of nine hole peg test by 4 seconds.     Time 3   Period Weeks   Status On-going     OT SHORT TERM GOAL #4   Title Patient will be educated on safe tub transfer using tub transfer bench.   Time 3   Period Weeks   Status Achieved     OT SHORT TERM GOAL #5   Title Patient will improve handwriting fluidity to 85%.     Time 3   Period Weeks   Status On-going           OT Long Term Goals - 03/26/17 1531      OT LONG TERM GOAL #1   Title Patient will return to prior level of function with all B/IADLs and leisure activities. 6   Time 6   Period Weeks   Status On-going     OT LONG TERM GOAL #2   Title Patient will improve GMC in right arm to 90% for improved safety with ADL completion.   Time 6   Period Weeks   Status On-going     OT LONG TERM GOAL #3   Title Patient will improve right hand fine motor coordination for improved accuracy with home maitenance tasks by increasing speed on nine hole peg test to 25" or better.    Time 6   Period Weeks   Status On-going     OT LONG TERM GOAL #4   Title Patient will improve handwriting to 90% fluidity.  Time 6   Period Weeks   Status On-going     OT LONG TERM GOAL #5   Title Patient will present  with increased sustained grip strength as he reports less difficulty with holding onto items for greater than 3 minutes with less drops.   Time 6   Period Weeks   Status On-going               Plan - 03/26/17 2118    Clinical Impression Statement A: Reassessment completed today, pt has only had 1 treatment thus far due to wife having surgery and pt being unable to attend. Pt has met 1 STG and partially met an additonal STG. Pt has made improvements in gross and fine motor coordination, as well as grip and pinch strength. Pt would benefit from continued OT services to continue working towards improving strength and coordination required for functional task completion.    Rehab Potential Good   OT Frequency 2x / week   OT Duration --  3 weeks   OT Treatment/Interventions Self-care/ADL training;Electrical Stimulation;Moist Heat;Ultrasound;Contrast Bath;Therapeutic exercise;Neuromuscular education;Energy conservation;DME and/or AE instruction;Manual Therapy;Therapeutic exercises;Therapeutic activities;Patient/family education   Plan P: Continue working towards improved fine and gross motor coordination, as well as grip and pinch strength, proximal shoulder strengthening, working towards improving functional task completion.    OT Home Exercise Plan 02/17/17:  fine motor coordination training   Consulted and Agree with Plan of Care Patient      Patient will benefit from skilled therapeutic intervention in order to improve the following deficits and impairments:  Decreased activity tolerance, Decreased coordination, Decreased strength, Impaired UE functional use  Visit Diagnosis: Other lack of coordination    Problem List Patient Active Problem List   Diagnosis Date Noted  . Acute CVA (cerebrovascular accident) ([place]) 02/01/2017  . Diabetes mellitus ([place]) 02/01/2017  . HTN (hypertension) 02/01/2017  . Primary osteoarthritis of left hip 01/08/2016  . DJD (degenerative joint disease)  03/20/2015  . Primary osteoarthritis of right hip 02/26/2015  . Lumbar spondylosis 10/17/2014  . Lumbago 07/25/2014  . Abnormality of gait 07/25/2014  . Difficulty in walking(719.7) 07/25/2014  . TIA (transient ischemic attack) 03/09/2013  . Cocaine abuse 03/09/2013  . Accelerated hypertension 03/09/2013  . Hyperlipidemia 03/09/2013  . Current smoker 03/09/2013   Guadelupe Sabin, OTR/L  [phone] 03/26/2017, 9:26 PM  [facility] [address] [place], [place], [address] Phone: [phone]   Fax:  [phone]  Name: Raymond Barrera MRN: [id number] Date of Birth: 12-24-1960

## 2017-04-02 ENCOUNTER — Telehealth (HOSPITAL_COMMUNITY): Payer: Self-pay

## 2017-04-02 ENCOUNTER — Telehealth (HOSPITAL_COMMUNITY): Payer: Self-pay | Admitting: Family Medicine

## 2017-04-02 ENCOUNTER — Ambulatory Visit (HOSPITAL_COMMUNITY): Payer: Medicaid Other

## 2017-04-02 ENCOUNTER — Ambulatory Visit (HOSPITAL_COMMUNITY): Payer: Medicaid Other | Admitting: Speech Pathology

## 2017-04-02 NOTE — Telephone Encounter (Signed)
04/02/17 wife called and wanted earlier appointments but they weren't available.  He is helping someone today and if he doesn't get done in time then he will not be at these appts but wife will call back to let us know.

## 2017-04-02 NOTE — Telephone Encounter (Signed)
He is having car trouble and wont make it today

## 2017-04-06 ENCOUNTER — Ambulatory Visit (HOSPITAL_COMMUNITY): Payer: Medicaid Other | Attending: Family Medicine | Admitting: Speech Pathology

## 2017-04-06 DIAGNOSIS — R278 Other lack of coordination: Secondary | ICD-10-CM | POA: Insufficient documentation

## 2017-04-06 DIAGNOSIS — R29898 Other symptoms and signs involving the musculoskeletal system: Secondary | ICD-10-CM | POA: Insufficient documentation

## 2017-04-08 ENCOUNTER — Ambulatory Visit (HOSPITAL_COMMUNITY): Payer: Medicaid Other | Admitting: Occupational Therapy

## 2017-04-08 ENCOUNTER — Telehealth (HOSPITAL_COMMUNITY): Payer: Self-pay | Admitting: Occupational Therapy

## 2017-04-08 NOTE — Telephone Encounter (Signed)
Called pt re: no-show, pt thought his appt was on Thursday. Confirmed Friday's appt with pt.    Guadelupe Sabin, OTR/L  [phone] 04/08/2017

## 2017-04-10 ENCOUNTER — Encounter (HOSPITAL_COMMUNITY): Payer: Self-pay | Admitting: Occupational Therapy

## 2017-04-10 ENCOUNTER — Ambulatory Visit (HOSPITAL_COMMUNITY): Payer: Medicaid Other | Admitting: Occupational Therapy

## 2017-04-10 DIAGNOSIS — R29898 Other symptoms and signs involving the musculoskeletal system: Secondary | ICD-10-CM | POA: Diagnosis present

## 2017-04-10 DIAGNOSIS — R278 Other lack of coordination: Secondary | ICD-10-CM

## 2017-04-10 NOTE — Therapy (Signed)
[place], [place], [address] Phone: [phone]   Fax:  [phone]  Occupational Therapy Treatment  Patient Details  Name: ATIF CHAPPLE MRN: [id number] Date of Birth: 1960/12/18 Referring Provider: Dr. Cindie Laroche  Encounter Date: 04/10/2017      OT End of Session - 04/10/17 1206    Visit Number 4   Number of Visits 12   Date for OT Re-Evaluation 04/17/17   Authorization Type Medicaid   Authorization Time Period Approved 6 visits (03/31/17-04/20/2017)   Authorization - Visit Number 1   Authorization - Number of Visits 6   OT Start Time 1120   OT Stop Time 1201   OT Time Calculation (min) 41 min   Activity Tolerance Patient tolerated treatment well   Behavior During Therapy [facility] for tasks assessed/performed      Past Medical History:  Diagnosis Date  . Anxiety    takes Valium daily as needed  . Arthritis   . Back pain    hx of buldging disc  . Diabetes mellitus without complication ([place])    takes Metformin daily  . Headache    migraines  . High cholesterol    takes Zocor daily  . Hypertension    takes Benazepril and HCTZ  daily  . Joint pain   . Joint swelling   . Memory impairment    occassional - from stroke  . Myocardial infarction ([place]) 1987  . Pneumonia    hx of-80's  . PONV (postoperative nausea and vomiting)    took a while for him to wake up after previous anesthesia  . Shortness of breath dyspnea    with exertion  . Slurred speech   . Stroke ([place]) 08/2013  . TIA (transient ischemic attack) 2014   x 7   . Urinary frequency     Past Surgical History:  Procedure Laterality Date  . ANKLE SURGERY  2008   left ankle-[facility]  . BACK SURGERY    . JOINT REPLACEMENT     both hips replaced   . LUMBAR LAMINECTOMY/DECOMPRESSION MICRODISCECTOMY Right 10/17/2014   Procedure: LUMBAR LAMINECTOMY/DECOMPRESSION MICRODISCECTOMY 2 LEVELS;  Surgeon: [name], MD;  Location: [facility];  Service: Neurosurgery;  Laterality: Right;  Right L45 L5S1 laminectomy and foraminotomy  . MASS EXCISION  09/13/2012   Procedure: EXCISION MASS;  Surgeon: Jamesetta So, MD;  [facility];  Service: General;  Laterality: N/A;  . TONSILLECTOMY    . TOTAL HIP ARTHROPLASTY Right 03/20/2015  . TOTAL HIP ARTHROPLASTY Right 03/20/2015   Procedure: TOTAL HIP ARTHROPLASTY ANTERIOR APPROACH;  Surgeon: Renette Butters, MD;  Location: [facility];  Service: Orthopedics;  Laterality: Right;  . TOTAL HIP ARTHROPLASTY Left 01/08/2016   Procedure: TOTAL HIP ARTHROPLASTY ANTERIOR APPROACH;  Surgeon: Renette Butters, MD;  Location: [facility];  Service: Orthopedics;  Laterality: Left;    There were no vitals filed for this visit.      Subjective Assessment - 04/10/17 1116    Subjective  S: I feel like my handwriting is getting better.    Currently in Pain? No/denies            [facility] OT Assessment - 04/10/17 1116      Assessment   Diagnosis Right Arm Weakness and Lack of Coordination S/P L CVA     Precautions   Precautions Fall     Restrictions   Weight Bearing Restrictions No  OT Treatments/Exercises (OP) - 04/10/17 1121      Exercises   Exercises Hand     Hand Exercises   Theraputty Flatten   Theraputty - Flatten green   Theraputty - Roll green   Theraputty - Grip green-supinated and pronated   Theraputty - Pinch green-lateral and 3 point   Hand Gripper with Large Beads all beads gripper at 69#   Hand Gripper with Medium Beads all beads at 69#   Hand Gripper with Small Beads 7/15 beads at 61#   Other Hand Exercises Pt used pvc pipe to cut circles into green theraputty, mod difficulty     Fine Motor Coordination   Fine Motor Coordination Small Pegboard   Small Pegboard Pt used tweezers to place pegs in small pegboard using right hand. Min difficulty with tweezer use and maintaining grasp, increased time required for completion                   OT Short Term Goals - 03/26/17 1530      OT SHORT TERM GOAL #1   Title Patient will be educated on a HEP for improved [place] and [place] in right arm needed for return to PLOF.   Time 3   Period Weeks   Status On-going     OT SHORT TERM GOAL #2   Title Patient will improve GMC in RUE as demonstrated by a 50% improvement in rapid supination/pronation task.   Time 3   Period Weeks   Status Partially Met     OT SHORT TERM GOAL #3   Title Patient will improve fine motor coordination in his right hand for improved independence with manipulating tools when working on his car by improving completion speed of nine hole peg test by 4 seconds.     Time 3   Period Weeks   Status On-going     OT SHORT TERM GOAL #4   Title Patient will be educated on safe tub transfer using tub transfer bench.   Time 3   Period Weeks   Status Achieved     OT SHORT TERM GOAL #5   Title Patient will improve handwriting fluidity to 85%.     Time 3   Period Weeks   Status On-going           OT Long Term Goals - 03/26/17 1531      OT LONG TERM GOAL #1   Title Patient will return to prior level of function with all B/IADLs and leisure activities. 6   Time 6   Period Weeks   Status On-going     OT LONG TERM GOAL #2   Title Patient will improve GMC in right arm to 90% for improved safety with ADL completion.   Time 6   Period Weeks   Status On-going     OT LONG TERM GOAL #3   Title Patient will improve right hand fine motor coordination for improved accuracy with home maitenance tasks by increasing speed on nine hole peg test to 25" or better.    Time 6   Period Weeks   Status On-going     OT LONG TERM GOAL #4   Title Patient will improve handwriting to 90% fluidity.     Time 6   Period Weeks   Status On-going     OT LONG TERM GOAL #5   Title Patient will present with increased sustained grip strength as he reports less difficulty with holding onto items for greater  than 3 minutes with less  drops.   Time 6   Period Weeks   Status On-going               Plan - 04/10/17 1208    Clinical Impression Statement A: Grip strengthening, pinch strengthening, and fine motor coordination tasks completed this session. Pt with improved fine motor coordination, continues to require increased time for task completion. Pt reporting fatigue during grip strengthening.    Plan P: Continue working towards improve coordination, complete pinch strengthening task and grooved pegboard with tweezers   OT Home Exercise Plan 02/17/17:  fine motor coordination training   Consulted and Agree with Plan of Care Patient      Patient will benefit from skilled therapeutic intervention in order to improve the following deficits and impairments:  Decreased activity tolerance, Decreased coordination, Decreased strength, Impaired UE functional use  Visit Diagnosis: Other lack of coordination  Other symptoms and signs involving the musculoskeletal system    Problem List Patient Active Problem List   Diagnosis Date Noted  . Acute CVA (cerebrovascular accident) ([place]) 02/01/2017  . Diabetes mellitus ([place]) 02/01/2017  . HTN (hypertension) 02/01/2017  . Primary osteoarthritis of left hip 01/08/2016  . DJD (degenerative joint disease) 03/20/2015  . Primary osteoarthritis of right hip 02/26/2015  . Lumbar spondylosis 10/17/2014  . Lumbago 07/25/2014  . Abnormality of gait 07/25/2014  . Difficulty in walking(719.7) 07/25/2014  . TIA (transient ischemic attack) 03/09/2013  . Cocaine abuse 03/09/2013  . Accelerated hypertension 03/09/2013  . Hyperlipidemia 03/09/2013  . Current smoker 03/09/2013   Guadelupe Sabin, OTR/L  [phone] 04/10/2017, 12:13 PM  [facility] [address] [place], [place], [address] Phone: [phone]   Fax:  [phone]  Name: JERRICO COVELLO MRN: [id number] Date of Birth: 1960/12/16

## 2017-04-15 ENCOUNTER — Encounter (HOSPITAL_COMMUNITY): Payer: Self-pay | Admitting: Occupational Therapy

## 2017-04-15 ENCOUNTER — Ambulatory Visit (HOSPITAL_COMMUNITY): Payer: Medicaid Other | Admitting: Occupational Therapy

## 2017-04-15 DIAGNOSIS — R278 Other lack of coordination: Secondary | ICD-10-CM

## 2017-04-15 DIAGNOSIS — R29898 Other symptoms and signs involving the musculoskeletal system: Secondary | ICD-10-CM

## 2017-04-15 NOTE — Therapy (Signed)
[place], [place], [address] Phone: [phone]   Fax:  [phone]  Occupational Therapy Treatment  Patient Details  Name: Raymond Barrera MRN: [id number] Date of Birth: [birth year] Referring Provider: Dr. Cindie Laroche  Encounter Date: 04/15/2017      OT End of Session - 04/15/17 1202    Visit Number 5   Number of Visits 12   Date for OT Re-Evaluation 04/17/17   Authorization Type Medicaid   Authorization Time Period Approved 6 visits (03/31/17-04/20/2017)   Authorization - Visit Number 2   Authorization - Number of Visits 6   OT Start Time 1791   OT Stop Time 1159   OT Time Calculation (min) 42 min   Activity Tolerance Patient tolerated treatment well   Behavior During Therapy [facility] for tasks assessed/performed      Past Medical History:  Diagnosis Date  . Anxiety    takes Valium daily as needed  . Arthritis   . Back pain    hx of buldging disc  . Diabetes mellitus without complication ([place])    takes Metformin daily  . Headache    migraines  . High cholesterol    takes Zocor daily  . Hypertension    takes Benazepril and HCTZ  daily  . Joint pain   . Joint swelling   . Memory impairment    occassional - from stroke  . Myocardial infarction ([place]) 1987  . Pneumonia    hx of-80's  . PONV (postoperative nausea and vomiting)    took a while for him to wake up after previous anesthesia  . Shortness of breath dyspnea    with exertion  . Slurred speech   . Stroke ([place]) 08/2013  . TIA (transient ischemic attack) 2014   x 7   . Urinary frequency     Past Surgical History:  Procedure Laterality Date  . ANKLE SURGERY  2008   left ankle-[facility]  . BACK SURGERY    . JOINT REPLACEMENT     both hips replaced   . LUMBAR LAMINECTOMY/DECOMPRESSION MICRODISCECTOMY Right 10/17/2014   Procedure: LUMBAR LAMINECTOMY/DECOMPRESSION MICRODISCECTOMY 2 LEVELS;  Surgeon: [name], MD;  Location: [facility];  Service: Neurosurgery;  Laterality: Right;  Right L45 L5S1 laminectomy and foraminotomy  . MASS EXCISION  09/13/2012   Procedure: EXCISION MASS;  Surgeon: Jamesetta So, MD;  [facility];  Service: General;  Laterality: N/A;  . TONSILLECTOMY    . TOTAL HIP ARTHROPLASTY Right 03/20/2015  . TOTAL HIP ARTHROPLASTY Right 03/20/2015   Procedure: TOTAL HIP ARTHROPLASTY ANTERIOR APPROACH;  Surgeon: Renette Butters, MD;  Location: [facility];  Service: Orthopedics;  Laterality: Right;  . TOTAL HIP ARTHROPLASTY Left 01/08/2016   Procedure: TOTAL HIP ARTHROPLASTY ANTERIOR APPROACH;  Surgeon: Renette Butters, MD;  Location: [facility];  Service: Orthopedics;  Laterality: Left;    There were no vitals filed for this visit.      Subjective Assessment - 04/15/17 1116    Subjective  S: I've been working with my putty.    Currently in Pain? No/denies            [facility] OT Assessment - 04/15/17 1115      Assessment   Diagnosis Right Arm Weakness and Lack of Coordination S/P L CVA     Precautions   Precautions Fall     Restrictions   Weight Bearing Restrictions No  OT Treatments/Exercises (OP) - 04/15/17 1120      Exercises   Exercises Hand     Hand Exercises   Hand Gripper with Small Beads all beads gripper set at 55#     Fine Motor Coordination   Fine Motor Coordination Grooved pegs   Grooved pegs Pt used tweezers to place grooved pegs into board using right hand. Pt required increased time to complete, mod difficulty with coordination. Pt then removed one at a time and held in palm until all pegs were removed.    Other Fine Motor Exercises Pt completed timed perfection game, 2 trials. Trial 1: pt completed in 2'54". Trial 2: pt completed in 2'01" using tweezers to place shapes.                   OT Short Term Goals - 03/26/17 1530      OT SHORT TERM GOAL #1   Title Patient will be educated on a HEP for improved [place] and [place] in right arm needed  for return to PLOF.   Time 3   Period Weeks   Status On-going     OT SHORT TERM GOAL #2   Title Patient will improve GMC in RUE as demonstrated by a 50% improvement in rapid supination/pronation task.   Time 3   Period Weeks   Status Partially Met     OT SHORT TERM GOAL #3   Title Patient will improve fine motor coordination in his right hand for improved independence with manipulating tools when working on his car by improving completion speed of nine hole peg test by 4 seconds.     Time 3   Period Weeks   Status On-going     OT SHORT TERM GOAL #4   Title Patient will be educated on safe tub transfer using tub transfer bench.   Time 3   Period Weeks   Status Achieved     OT SHORT TERM GOAL #5   Title Patient will improve handwriting fluidity to 85%.     Time 3   Period Weeks   Status On-going           OT Long Term Goals - 03/26/17 1531      OT LONG TERM GOAL #1   Title Patient will return to prior level of function with all B/IADLs and leisure activities. 6   Time 6   Period Weeks   Status On-going     OT LONG TERM GOAL #2   Title Patient will improve GMC in right arm to 90% for improved safety with ADL completion.   Time 6   Period Weeks   Status On-going     OT LONG TERM GOAL #3   Title Patient will improve right hand fine motor coordination for improved accuracy with home maitenance tasks by increasing speed on nine hole peg test to 25" or better.    Time 6   Period Weeks   Status On-going     OT LONG TERM GOAL #4   Title Patient will improve handwriting to 90% fluidity.     Time 6   Period Weeks   Status On-going     OT LONG TERM GOAL #5   Title Patient will present with increased sustained grip strength as he reports less difficulty with holding onto items for greater than 3 minutes with less drops.   Time 6   Period Weeks   Status On-going  Plan - 04/15/17 1202    Clinical Impression Statement A: Session focusing on fine  motor coordination and grip strengthening, pt improving with fine motor tasks. Continues to require increased time for fine motor coordination tasks.    Plan P: continue working towards improved fine motor coordination, complete nuts and bolts, paperclip maze   OT Home Exercise Plan 02/17/17:  fine motor coordination training   Consulted and Agree with Plan of Care Patient      Patient will benefit from skilled therapeutic intervention in order to improve the following deficits and impairments:  Decreased activity tolerance, Decreased coordination, Decreased strength, Impaired UE functional use  Visit Diagnosis: Other lack of coordination  Other symptoms and signs involving the musculoskeletal system    Problem List Patient Active Problem List   Diagnosis Date Noted  . Acute CVA (cerebrovascular accident) ([place]) 02/01/2017  . Diabetes mellitus ([place]) 02/01/2017  . HTN (hypertension) 02/01/2017  . Primary osteoarthritis of left hip 01/08/2016  . DJD (degenerative joint disease) 03/20/2015  . Primary osteoarthritis of right hip 02/26/2015  . Lumbar spondylosis 10/17/2014  . Lumbago 07/25/2014  . Abnormality of gait 07/25/2014  . Difficulty in walking(719.7) 07/25/2014  . TIA (transient ischemic attack) 03/09/2013  . Cocaine abuse 03/09/2013  . Accelerated hypertension 03/09/2013  . Hyperlipidemia 03/09/2013  . Current smoker 03/09/2013    Guadelupe Sabin, OTR/L  [phone] 04/15/2017, 12:05 PM  [facility] [address] [place], [place], [address] Phone: [phone]   Fax:  [phone]  Name: Raymond Barrera MRN: [id number] Date of Birth: 1961-08-28

## 2017-04-17 ENCOUNTER — Ambulatory Visit (HOSPITAL_COMMUNITY): Payer: Medicaid Other | Admitting: Occupational Therapy

## 2017-04-17 ENCOUNTER — Encounter (HOSPITAL_COMMUNITY): Payer: Self-pay | Admitting: Occupational Therapy

## 2017-04-17 DIAGNOSIS — R29898 Other symptoms and signs involving the musculoskeletal system: Secondary | ICD-10-CM

## 2017-04-17 DIAGNOSIS — R278 Other lack of coordination: Secondary | ICD-10-CM | POA: Diagnosis not present

## 2017-04-17 NOTE — Therapy (Signed)
[facility] [place], [place], [address] Phone: [phone]   Fax:  [phone]  Occupational Therapy Reassessment and Treatment  Patient Details  Name: Raymond Barrera MRN: [id number] Date of Birth: 1961/11/01 Referring Provider: Dr. Cindie Laroche  Encounter Date: 04/17/2017      OT End of Session - 04/17/17 1154    Visit Number 6   Number of Visits 12   Date for OT Re-Evaluation 04/24/17   Authorization Type Medicaid   Authorization Time Period Approved 6 visits (03/31/17-04/20/2017)   Authorization - Visit Number 3   Authorization - Number of Visits 6   OT Start Time 1106   OT Stop Time 1150   OT Time Calculation (min) 44 min   Activity Tolerance Patient tolerated treatment well   Behavior During Therapy [facility] for tasks assessed/performed      Past Medical History:  Diagnosis Date  . Anxiety    takes Valium daily as needed  . Arthritis   . Back pain    hx of buldging disc  . Diabetes mellitus without complication ([place])    takes Metformin daily  . Headache    migraines  . High cholesterol    takes Zocor daily  . Hypertension    takes Benazepril and HCTZ  daily  . Joint pain   . Joint swelling   . Memory impairment    occassional - from stroke  . Myocardial infarction ([place]) 1987  . Pneumonia    hx of-80's  . PONV (postoperative nausea and vomiting)    took a while for him to wake up after previous anesthesia  . Shortness of breath dyspnea    with exertion  . Slurred speech   . Stroke ([place]) 08/2013  . TIA (transient ischemic attack) 2014   x 7   . Urinary frequency     Past Surgical History:  Procedure Laterality Date  . ANKLE SURGERY  2008   left ankle-otif-[facility]  . BACK SURGERY    . JOINT REPLACEMENT     both hips replaced   . LUMBAR LAMINECTOMY/DECOMPRESSION MICRODISCECTOMY Right 10/17/2014   Procedure: LUMBAR LAMINECTOMY/DECOMPRESSION MICRODISCECTOMY 2 LEVELS;  Surgeon: [name], MD;   Location: [facility];  Service: Neurosurgery;  Laterality: Right;  Right L45 L5S1 laminectomy and foraminotomy  . MASS EXCISION  09/13/2012   Procedure: EXCISION MASS;  Surgeon: Jamesetta So, MD;  [facility];  Service: General;  Laterality: N/A;  . TONSILLECTOMY    . TOTAL HIP ARTHROPLASTY Right 03/20/2015  . TOTAL HIP ARTHROPLASTY Right 03/20/2015   Procedure: TOTAL HIP ARTHROPLASTY ANTERIOR APPROACH;  Surgeon: Renette Butters, MD;  Location: [facility];  Service: Orthopedics;  Laterality: Right;  . TOTAL HIP ARTHROPLASTY Left 01/08/2016   Procedure: TOTAL HIP ARTHROPLASTY ANTERIOR APPROACH;  Surgeon: Renette Butters, MD;  Location: [facility];  Service: Orthopedics;  Laterality: Left;    There were no vitals filed for this visit.      Subjective Assessment - 04/17/17 1102    Subjective  S: I feel like my strength is getting better.    Currently in Pain? No/denies            [facility] OT Assessment - 04/17/17 1101      Assessment   Diagnosis Right Arm Weakness and Lack of Coordination S/P L CVA     Precautions   Precautions Fall     Restrictions   Weight Bearing Restrictions No     Coordination  Right 9 Hole Peg Test 27.9"  28.22" previous   Box and Blocks 54  52 previous     Strength   Strength Assessment Site Hand   Right/Left hand Right   Right Hand Grip (lbs) 125  113 previous   Right Hand Lateral Pinch 34 lbs  23 previous   Right Hand 3 Point Pinch 32 lbs  22 previous                  OT Treatments/Exercises (OP) - 04/17/17 1123      Exercises   Exercises Hand     Hand Exercises   Hand Gripper with Large Beads all beads gripper at 69#   Hand Gripper with Medium Beads all beads at 69#   Hand Gripper with Small Beads all beads gripper set at 55#     Fine Motor Coordination   Fine Motor Coordination Nuts and Bolts   Small Pegboard Pt placed pegs into pegboard, holding all pegs in hand and practicing palm to fingertip translation to place pegs.  Mod difficulty holding pegs in palm, min difficulty with peg placement   Nuts and Bolts Pt held 8 nuts in palm and placed on bolts one at a time. Min difficulty with beginning to screw nuts onto bolts. Min difficulty keeping nuts in palm during placement.                   OT Short Term Goals - 04/17/17 1116      OT SHORT TERM GOAL #1   Title Patient will be educated on a HEP for improved [place] and [place] in right arm needed for return to PLOF.   Time 3   Period Weeks   Status Achieved     OT SHORT TERM GOAL #2   Title Patient will improve GMC in RUE as demonstrated by a 50% improvement in rapid supination/pronation task.   Time 3   Period Weeks   Status Achieved     OT SHORT TERM GOAL #3   Title Patient will improve fine motor coordination in his right hand for improved independence with manipulating tools when working on his car by improving completion speed of nine hole peg test by 4 seconds.     Time 3   Period Weeks   Status Achieved     OT SHORT TERM GOAL #4   Title Patient will be educated on safe tub transfer using tub transfer bench.   Time 3   Period Weeks   Status Achieved     OT SHORT TERM GOAL #5   Title Patient will improve handwriting fluidity to 85%.     Time 3   Period Weeks   Status On-going           OT Long Term Goals - 04/17/17 1118      OT LONG TERM GOAL #1   Title Patient will return to prior level of function with all B/IADLs and leisure activities.    Time 6   Period Weeks   Status Achieved     OT LONG TERM GOAL #2   Title Patient will improve GMC in right arm to 90% for improved safety with ADL completion.   Time 6   Period Weeks   Status Achieved     OT LONG TERM GOAL #3   Title Patient will improve right hand fine motor coordination for improved accuracy with home maitenance tasks by increasing speed on nine hole peg test to 25" or  better.    Time 6   Period Weeks   Status On-going     OT LONG TERM GOAL #4   Title Patient  will improve handwriting to 90% fluidity.     Time 6   Period Weeks   Status On-going     OT LONG TERM GOAL #5   Title Patient will present with increased sustained grip strength as he reports less difficulty with holding onto items for greater than 3 minutes with less drops.   Time 6   Period Weeks   Status Achieved               Plan - 04/17/17 1155    Clinical Impression Statement A: Reassessment completed this session, pt has met 4/5 STGs and 3/5 LTGs improving gross motor coordination, fine motor coordination, and sustained grip strength. Pt continues to demonstrate deficits with fine motor coordination, and reports although he is not dropping items very often, feels his grip strength continues to need work. Pt has one more visit approved by Medicaid, pt will be educated on fine motor coordination tasks, grip strengthening, and handwriting strategies and discharge with HEP at next visit.    Plan P: Update HEP for above areas, discharge   OT Home Exercise Plan 02/17/17:  fine motor coordination training   Consulted and Agree with Plan of Care Patient      Patient will benefit from skilled therapeutic intervention in order to improve the following deficits and impairments:  Decreased activity tolerance, Decreased coordination, Decreased strength, Impaired UE functional use  Visit Diagnosis: Other lack of coordination  Other symptoms and signs involving the musculoskeletal system    Problem List Patient Active Problem List   Diagnosis Date Noted  . Acute CVA (cerebrovascular accident) ([place]) 02/01/2017  . Diabetes mellitus ([place]) 02/01/2017  . HTN (hypertension) 02/01/2017  . Primary osteoarthritis of left hip 01/08/2016  . DJD (degenerative joint disease) 03/20/2015  . Primary osteoarthritis of right hip 02/26/2015  . Lumbar spondylosis 10/17/2014  . Lumbago 07/25/2014  . Abnormality of gait 07/25/2014  . Difficulty in walking(719.7) 07/25/2014  . TIA (transient  ischemic attack) 03/09/2013  . Cocaine abuse 03/09/2013  . Accelerated hypertension 03/09/2013  . Hyperlipidemia 03/09/2013  . Current smoker 03/09/2013   Guadelupe Sabin, OTR/L  [phone] 04/17/2017, 11:58 AM  [facility] [address] [place], [place], [address] Phone: [phone]   Fax:  [phone]  Name: ALFONS SULKOWSKI MRN: [id number] Date of Birth: [birth year]

## 2017-04-20 ENCOUNTER — Encounter (HOSPITAL_COMMUNITY): Payer: Self-pay | Admitting: Occupational Therapy

## 2017-04-20 ENCOUNTER — Ambulatory Visit (HOSPITAL_COMMUNITY): Payer: Medicaid Other | Admitting: Occupational Therapy

## 2017-04-20 DIAGNOSIS — R278 Other lack of coordination: Secondary | ICD-10-CM | POA: Diagnosis not present

## 2017-04-20 DIAGNOSIS — R29898 Other symptoms and signs involving the musculoskeletal system: Secondary | ICD-10-CM

## 2017-04-20 NOTE — Patient Instructions (Signed)
Fine Motor Coordination Exercises:   1) Paperclip maze: Practice hooking and unhooking paperclips using both hands.   2) Coin activities: Practice holding coins in hand and moving from palm to fingertips without shaking hand. Then practice stacking coins in a pile.   3) Handwriting: Practice copying short paragraphs or journaling. Use lined paper and practice filling lines for greater fluidity. Practice printing and cursive.   4) Finger coordination and dexterity activity: practice touching each finger to your thumb, one at a time, as fast as you can.   5) Card shuffling: practice shuffling and dealing cards, working on speed and coordination.    Grip and Pinch Strengthening: Continue with theraputty exercises, continue using right hand as dominant during daily task completion.

## 2017-04-20 NOTE — Therapy (Signed)
[facility] [place], [place], [address] Phone: [phone]   Fax:  [phone]  Occupational Therapy Treatment and Discharge  Patient Details  Name: Raymond Barrera MRN: [id number] Date of Birth: 1961-02-08 Referring Provider: Dr. Cindie Laroche  Encounter Date: 04/20/2017      OT End of Session - 04/20/17 1157    Visit Number 7   Number of Visits 12   Date for OT Re-Evaluation 04/24/17   Authorization Type Medicaid   Authorization Time Period Approved 6 visits (03/31/17-04/20/2017)   Authorization - Visit Number 4   Authorization - Number of Visits 6   OT Start Time 5726   OT Stop Time 1155   OT Time Calculation (min) 43 min   Activity Tolerance Patient tolerated treatment well   Behavior During Therapy [facility] for tasks assessed/performed      Past Medical History:  Diagnosis Date  . Anxiety    takes Valium daily as needed  . Arthritis   . Back pain    hx of buldging disc  . Diabetes mellitus without complication ([place])    takes Metformin daily  . Headache    migraines  . High cholesterol    takes Zocor daily  . Hypertension    takes Benazepril and HCTZ  daily  . Joint pain   . Joint swelling   . Memory impairment    occassional - from stroke  . Myocardial infarction ([place]) 1987  . Pneumonia    hx of-80's  . PONV (postoperative nausea and vomiting)    took a while for him to wake up after previous anesthesia  . Shortness of breath dyspnea    with exertion  . Slurred speech   . Stroke ([place]) 08/2013  . TIA (transient ischemic attack) 2014   x 7   . Urinary frequency     Past Surgical History:  Procedure Laterality Date  . ANKLE SURGERY  2008   left ankle-otif-[facility]  . BACK SURGERY    . JOINT REPLACEMENT     both hips replaced   . LUMBAR LAMINECTOMY/DECOMPRESSION MICRODISCECTOMY Right 10/17/2014   Procedure: LUMBAR LAMINECTOMY/DECOMPRESSION MICRODISCECTOMY 2 LEVELS;  Surgeon: [name], MD;   Location: [facility];  Service: Neurosurgery;  Laterality: Right;  Right L45 L5S1 laminectomy and foraminotomy  . MASS EXCISION  09/13/2012   Procedure: EXCISION MASS;  Surgeon: Jamesetta So, MD;  [facility];  Service: General;  Laterality: N/A;  . TONSILLECTOMY    . TOTAL HIP ARTHROPLASTY Right 03/20/2015  . TOTAL HIP ARTHROPLASTY Right 03/20/2015   Procedure: TOTAL HIP ARTHROPLASTY ANTERIOR APPROACH;  Surgeon: Renette Butters, MD;  Location: [facility];  Service: Orthopedics;  Laterality: Right;  . TOTAL HIP ARTHROPLASTY Left 01/08/2016   Procedure: TOTAL HIP ARTHROPLASTY ANTERIOR APPROACH;  Surgeon: Renette Butters, MD;  Location: [facility];  Service: Orthopedics;  Laterality: Left;    There were no vitals filed for this visit.      Subjective Assessment - 04/20/17 1111    Subjective  S: I've been exercising this weekend.    Currently in Pain? No/denies            [facility] OT Assessment - 04/20/17 1110      Assessment   Diagnosis Right Arm Weakness and Lack of Coordination S/P L CVA     Precautions   Precautions Fall     Restrictions   Weight Bearing Restrictions No  OT Treatments/Exercises (OP) - 04/20/17 1112      ADLs   Writing Pt copied paragraph from magazine onto lined paper, using pen. Pt focused on forming upper and lowercase letters and improving fluidity of handwriting. Increased time, legibility at 90%.      Exercises   Exercises Hand     Hand Exercises   Theraputty Flatten   Theraputty - Flatten green   Other Hand Exercises Pt squeezed gripper focusing on sustained grip strength 5x10" at 69#. Min difficulty   Other Hand Exercises Pt used pvc pipe to cut circles in green putty, min difficulty     Fine Motor Coordination   Grooved pegs Pt used tweezers to place grooved pegs into board using right hand. Pt required increased time to complete, min difficulty with coordination. Pt then removed one at a time and held in palm until  all pegs were removed, min difficulty                OT Education - 04/20/17 1131    Education provided Yes   Education Details educated on fine motor coordination activities and grip and pinch strengthening   Person(s) Educated Patient   Methods Explanation;Demonstration   Comprehension Verbalized understanding;Returned demonstration          OT Short Term Goals - 04/20/17 1156      OT SHORT TERM GOAL #1   Title Patient will be educated on a HEP for improved [place] and [place] in right arm needed for return to PLOF.   Time 3   Period Weeks   Status Achieved     OT SHORT TERM GOAL #2   Title Patient will improve GMC in RUE as demonstrated by a 50% improvement in rapid supination/pronation task.   Time 3   Period Weeks   Status Achieved     OT SHORT TERM GOAL #3   Title Patient will improve fine motor coordination in his right hand for improved independence with manipulating tools when working on his car by improving completion speed of nine hole peg test by 4 seconds.     Time 3   Period Weeks   Status Achieved     OT SHORT TERM GOAL #4   Title Patient will be educated on safe tub transfer using tub transfer bench.   Time 3   Period Weeks   Status Achieved     OT SHORT TERM GOAL #5   Title Patient will improve handwriting fluidity to 85%.     Time 3   Period Weeks   Status Achieved           OT Long Term Goals - 04/20/17 1156      OT LONG TERM GOAL #1   Title Patient will return to prior level of function with all B/IADLs and leisure activities.    Time 6   Period Weeks   Status Achieved     OT LONG TERM GOAL #2   Title Patient will improve GMC in right arm to 90% for improved safety with ADL completion.   Time 6   Period Weeks   Status Achieved     OT LONG TERM GOAL #3   Title Patient will improve right hand fine motor coordination for improved accuracy with home maitenance tasks by increasing speed on nine hole peg test to 25" or better.    Time  6   Period Weeks   Status Not Met     OT LONG TERM GOAL #4  Title Patient will improve handwriting to 90% fluidity.     Time 6   Period Weeks   Status Not Met     OT LONG TERM GOAL #5   Title Patient will present with increased sustained grip strength as he reports less difficulty with holding onto items for greater than 3 minutes with less drops.   Time 6   Period Weeks   Status Achieved               Plan - 04/20/17 1157    Clinical Impression Statement A: Pt has met all STGs and 3/5 LTGs. Pt educated on HEP this session including fine motor coordination activities, handwriting, and grip/pinch strengthening tasks for continued improvement at home. Pt completed handwriting task today, using lined paper provided by OT, with fluidity at approximately 75-85% when printing. Pt is agreeable to discharge with HEP.    Plan P: Discharge pt   OT Home Exercise Plan 02/17/17:  fine motor coordination training; 5/21: fine motor coordination, handwriting, grip/pinch strengthening   Consulted and Agree with Plan of Care Patient      Patient will benefit from skilled therapeutic intervention in order to improve the following deficits and impairments:  Decreased activity tolerance, Decreased coordination, Decreased strength, Impaired UE functional use  Visit Diagnosis: Other lack of coordination  Other symptoms and signs involving the musculoskeletal system    Problem List Patient Active Problem List   Diagnosis Date Noted  . Acute CVA (cerebrovascular accident) ([place]) 02/01/2017  . Diabetes mellitus ([place]) 02/01/2017  . HTN (hypertension) 02/01/2017  . Primary osteoarthritis of left hip 01/08/2016  . DJD (degenerative joint disease) 03/20/2015  . Primary osteoarthritis of right hip 02/26/2015  . Lumbar spondylosis 10/17/2014  . Lumbago 07/25/2014  . Abnormality of gait 07/25/2014  . Difficulty in walking(719.7) 07/25/2014  . TIA (transient ischemic attack) 03/09/2013  .  Cocaine abuse 03/09/2013  . Accelerated hypertension 03/09/2013  . Hyperlipidemia 03/09/2013  . Current smoker 03/09/2013   Guadelupe Sabin, OTR/L  [phone] 04/20/2017, 12:01 PM  [facility] [address] [place], [place], [address] Phone: [phone]   Fax:  [phone]  Name: EVON LOPEZPEREZ MRN: [id number] Date of Birth: 01-26-1961    OCCUPATIONAL THERAPY DISCHARGE SUMMARY  Visits from Start of Care: 7  Current functional level related to goals / functional outcomes: Pt has made improvements in gross and fine motor coordination, as well as grip and pinch strength. Has met all STGs, and 3/5 LTGs. Pt is now using RUE as dominant during ADL completion with minimally increased time.     Remaining deficits: Pt continues to have difficulty with handwriting fluidity and speed with fine motor coordination tasks.    Education / Equipment: Pt educated on HEP including fine motor coordination activities, handwriting, and grip/pinch strengthening tasks.   Plan: Patient agrees to discharge.  Patient goals were met. Patient is being discharged due to meeting the stated rehab goals.  ?????

## 2017-05-06 ENCOUNTER — Telehealth (HOSPITAL_COMMUNITY): Payer: Self-pay | Admitting: Occupational Therapy

## 2017-05-06 NOTE — Telephone Encounter (Signed)
Patient wife called to see if someone was trying to reach him .

## 2017-07-27 ENCOUNTER — Encounter (HOSPITAL_COMMUNITY): Payer: Self-pay

## 2017-07-27 ENCOUNTER — Ambulatory Visit (HOSPITAL_COMMUNITY): Payer: Medicaid Other | Admitting: Speech Pathology

## 2017-07-30 ENCOUNTER — Telehealth (HOSPITAL_COMMUNITY): Payer: Self-pay | Admitting: Family Medicine

## 2017-07-30 NOTE — Telephone Encounter (Signed)
30/18 rescheduled his speech eval .... Therapist has jury duty

## 2017-08-04 ENCOUNTER — Ambulatory Visit (HOSPITAL_COMMUNITY): Payer: Medicaid Other | Admitting: Speech Pathology

## 2017-08-05 ENCOUNTER — Ambulatory Visit (HOSPITAL_COMMUNITY): Payer: Medicaid Other | Attending: Family Medicine | Admitting: Speech Pathology

## 2017-08-05 ENCOUNTER — Encounter (HOSPITAL_COMMUNITY): Payer: Self-pay | Admitting: Speech Pathology

## 2017-08-05 DIAGNOSIS — R41841 Cognitive communication deficit: Secondary | ICD-10-CM | POA: Insufficient documentation

## 2017-08-05 DIAGNOSIS — I69822 Dysarthria following other cerebrovascular disease: Secondary | ICD-10-CM | POA: Diagnosis present

## 2017-08-05 NOTE — Addendum Note (Signed)
Addended by: Ephraim Hamburger on: 08/05/2017 02:06 PM   Modules accepted: Orders

## 2017-08-05 NOTE — Therapy (Signed)
[facility] [address] [place], [place], [address] Phone: [phone]   Fax:  [phone]  Speech Language Pathology Evaluation  Patient Details  Name: Raymond Barrera MRN: [id number] Date of Birth: Sep 27, 1961 Referring Provider: Lucia Gaskins  Encounter Date: 08/05/2017      End of Session - 08/05/17 1254    Visit Number 1   Number of Visits 7   Authorization Type Medicaid   Authorization Time Period Will submit authorization request   SLP Start Time 9485   SLP Stop Time  1120   SLP Time Calculation (min) 45 min   Activity Tolerance Patient tolerated treatment well      Past Medical History:  Diagnosis Date  . Anxiety    takes Valium daily as needed  . Arthritis   . Back pain    hx of buldging disc  . Diabetes mellitus without complication ([place])    takes Metformin daily  . Headache    migraines  . High cholesterol    takes Zocor daily  . Hypertension    takes Benazepril and HCTZ  daily  . Joint pain   . Joint swelling   . Memory impairment    occassional - from stroke  . Myocardial infarction ([place]) 1987  . Pneumonia    hx of-80's  . PONV (postoperative nausea and vomiting)    took a while for him to wake up after previous anesthesia  . Shortness of breath dyspnea    with exertion  . Slurred speech   . Stroke ([place]) 08/2013  . TIA (transient ischemic attack) 2014   x 7   . Urinary frequency     Past Surgical History:  Procedure Laterality Date  . ANKLE SURGERY  2008   left ankle-otif-[facility]  . BACK SURGERY    . JOINT REPLACEMENT     both hips replaced   . LUMBAR LAMINECTOMY/DECOMPRESSION MICRODISCECTOMY Right 10/17/2014   Procedure: LUMBAR LAMINECTOMY/DECOMPRESSION MICRODISCECTOMY 2 LEVELS;  Surgeon: [name], MD;  Location: [facility];  Service: Neurosurgery;  Laterality: Right;  Right L45 L5S1 laminectomy and foraminotomy  . MASS EXCISION  09/13/2012   Procedure: EXCISION MASS;  Surgeon: Jamesetta So, MD;  Location: [facility];  Service: General;  Laterality: N/A;  . TONSILLECTOMY    . TOTAL HIP ARTHROPLASTY Right 03/20/2015  . TOTAL HIP ARTHROPLASTY Right 03/20/2015   Procedure: TOTAL HIP ARTHROPLASTY ANTERIOR APPROACH;  Surgeon: Renette Butters, MD;  Location: [facility];  Service: Orthopedics;  Laterality: Right;  . TOTAL HIP ARTHROPLASTY Left 01/08/2016   Procedure: TOTAL HIP ARTHROPLASTY ANTERIOR APPROACH;  Surgeon: Renette Butters, MD;  Location: [facility];  Service: Orthopedics;  Laterality: Left;    There were no vitals filed for this visit.      Subjective Assessment - 08/05/17 1058    Subjective "I have trouble with my speech and it hurts to swallow sometimes."   Currently in Pain? No/denies            SLP Evaluation [facility] - 08/05/17 1058      SLP Visit Information   SLP Received On 08/05/17   Referring Provider Lucia Gaskins   Onset Date 02/01/2017   Medical Diagnosis s/p CVA     Subjective   Subjective "I am having trouble with my speech."   Patient/Family Stated Goal To be understood on the phone.     General Information   HPI Raymond Barrera is a 56 y.o. male with h/o  acute infarct in the left putamen and internal capsule posteriorly. Pt was seen for outpatient SLP therapy for three sessions and MBSS in April/May 2018 before not returning to clinic. Pt reported continued difficulties with speech intelligibility and requested another evaluation from his PCP. He was unaccompanied to today's appointment.     Behavioral/Cognition Alert and cooperative   Mobility Status ambulatory     Prior Functional Status   Cognitive/Linguistic Baseline Within functional limits   Type of Home Apartment    Lives With Spouse   Available Support Family;Friend(s)   Education 11th grade   Vocation Retired     Pain Assessment   Pain Assessment No/denies pain     Cognition   Overall Cognitive Status Impaired/Different from baseline   Area of Impairment Memory;Attention    Memory Decreased short-term memory   Attention Selective   Memory Impaired   Memory Impairment Storage deficit;Retrieval deficit;Decreased recall of new information;Decreased short term memory   Decreased Short Term Memory Verbal basic   Awareness Appears intact   Problem Solving Appears intact   Initiating Appears intact   Self Monitoring Appears intact     Auditory Comprehension   Overall Auditory Comprehension Appears within functional limits for tasks assessed   Yes/No Questions Not tested   Commands Within Functional Limits   Conversation Simple     Visual Recognition/Discrimination   Discrimination Not tested     Reading Comprehension   Reading Status Not tested     Expression   Primary Mode of Expression Verbal     Verbal Expression   Overall Verbal Expression Impaired   Initiation No impairment   Automatic Speech Name;Social Response;Day of week   Level of Generative/Spontaneous Verbalization Conversation   Repetition Impaired   Level of Impairment Sentence level   Naming No impairment   Responsive 76-100% accurate   Confrontation 75-100% accurate   Pragmatics No impairment   Interfering Components Speech intelligibility;Anatomical limitation   Non-Verbal Means of Communication Not applicable     Written Expression   Dominant Hand Right   Written Expression Not tested     Oral Motor/Sensory Function   Overall Oral Motor/Sensory Function Impaired   Labial ROM Reduced right   Labial Symmetry Abnormal symmetry right   Labial Strength Within Functional Limits   Labial Sensation Within Functional Limits   Labial Coordination WFL   Lingual ROM Within Functional Limits   Lingual Symmetry Within Functional Limits   Lingual Strength Within Functional Limits   Lingual Sensation Within Functional Limits   Lingual Coordination WFL   Facial ROM Within Functional Limits   Facial Symmetry Within Functional Limits   Facial Strength Within Functional Limits   Facial  Sensation Within Functional Limits   Facial Coordination WFL   Velum Impaired right   Mandible Within Functional Limits     Motor Speech   Overall Motor Speech Impaired   Respiration Impaired   Level of Impairment Sentence   Phonation --  Pt voices on inhalation at times   Resonance Within functional limits   Articulation Impaired   Level of Impairment Word   Intelligibility Intelligibility reduced   Word 75-100% accurate   Phrase 75-100% accurate   Sentence 50-74% accurate   Conversation 50-74% accurate   Motor Planning Witnin functional limits   Motor Speech Errors Aware;Unaware;Inconsistent   Effective Techniques Slow rate;Pause;Over-articulate   Phonation [facility]           SLP Education - 08/05/17 1252    Education provided Yes  Education Details Plan for SLP therapy to address speech intelligibility deficits; Encouraged him to d/w MD ENT/GI consult   Person(s) Educated Patient   Methods Explanation;Handout   Comprehension Verbalized understanding          SLP Short Term Goals - 08/05/17 1255      SLP SHORT TERM GOAL #1   Title Pt will increase conversation level speech intelligibility to 90% by utilizing appropriate strategies and listener needing repetition no more than 3x in a given conversation.   Baseline Clarification needed x4 in 5 minute conversation   Time 3   Period Weeks   Status New     SLP SHORT TERM GOAL #2   Title Pt will complete functional memory activities (mod level) with 90% accuracy when given min/mod cues and use of memory strategies.   Baseline 3/5 recall of words without cues   Time 3   Period Weeks   Status New     SLP SHORT TERM GOAL #3   Title Pt will increase speech intelligibility to 90% accuracy when utilizing multisyllabic words in sentences given min/mod cues from clinician.   Baseline 80% intelligible given sentences to repeat   Time 3   Period Weeks   Status New     SLP SHORT TERM GOAL #4   Baseline 75% intelligible  given sentences to repeat          SLP Long Term Goals - 08/05/17 1258      SLP LONG TERM GOAL #1   Title same as short term goals   Target Date 09/09/17          Plan - 08/05/17 1254    Clinical Impression Statement Pt presents with mild to mild/mod expressive language deficits characterized by dysarthria impacting speech intelligibility at the sentence and conversation level. He continues to have difficulty coordinating respiration and voicing (holding his breath at times when vocalizing). Pt also demonstrates mild working memory deficits which have persisted from his stroke in April. MBSS was completed in May and essentially WNL, however Pt c/o pain with swallow at times and is bothered by an inability to "belch". Pt had sialadenitis with two obstructing stones in May 2016 on the left side. Pt currently reports of soreness to right neck. Consider ENT consult if odynophagia persists and consider GI consult or esophageal assessment for [name] esophageal complaints. Pt reportedly trying to quit smoking. Pt will benefit from skilled SLP in order to address the above impairments, maximize independence, and decrease burden of care    Speech Therapy Frequency 2x / week   Duration --  3 weeks   Treatment/Interventions Language facilitation;Compensatory techniques;Cueing hierarchy;Cognitive reorganization;Internal/external aids;Oral motor exercises;Functional tasks;Multimodal communcation approach;SLP instruction and feedback;Compensatory strategies;Patient/family education   Potential to Achieve Goals Good   SLP Home Exercise Plan Continued oral motor stretches and visual reminders for compensatory strategies   Consulted and Agree with Plan of Care Patient;Family member/caregiver   Family Member Consulted Spouse      Patient will benefit from skilled therapeutic intervention in order to improve the following deficits and impairments:   Cognitive communication deficit  Dysarthria following  other cerebrovascular disease    Problem List Patient Active Problem List   Diagnosis Date Noted  . Acute CVA (cerebrovascular accident) ([place]) 02/01/2017  . Diabetes mellitus ([place]) 02/01/2017  . HTN (hypertension) 02/01/2017  . Primary osteoarthritis of left hip 01/08/2016  . DJD (degenerative joint disease) 03/20/2015  . Primary osteoarthritis of right hip 02/26/2015  . Lumbar spondylosis 10/17/2014  .  Lumbago 07/25/2014  . Abnormality of gait 07/25/2014  . Difficulty in walking(719.7) 07/25/2014  . TIA (transient ischemic attack) 03/09/2013  . Cocaine abuse 03/09/2013  . Accelerated hypertension 03/09/2013  . Hyperlipidemia 03/09/2013  . Current smoker 03/09/2013   Thank you,  Genene Churn, [place]  [facility] 08/05/2017, 1:00 PM  [facility] [place], [place], [address] Phone: [phone]   Fax:  [phone]  Name: Raymond Barrera MRN: [id number] Date of Birth: [birth year]

## 2017-08-12 ENCOUNTER — Encounter (HOSPITAL_COMMUNITY): Payer: Self-pay | Admitting: Speech Pathology

## 2017-08-12 ENCOUNTER — Ambulatory Visit (HOSPITAL_COMMUNITY): Payer: Medicaid Other | Admitting: Speech Pathology

## 2017-08-12 DIAGNOSIS — I69822 Dysarthria following other cerebrovascular disease: Secondary | ICD-10-CM

## 2017-08-12 DIAGNOSIS — R41841 Cognitive communication deficit: Secondary | ICD-10-CM

## 2017-08-12 NOTE — Therapy (Signed)
[facility] [place], [place], [address] Phone: [phone]   Fax:  [phone]  Speech Language Pathology Treatment  Patient Details  Name: Raymond Barrera MRN: [id number] Date of Birth: [birth year] Referring Provider: Lucia Gaskins  Encounter Date: 08/12/2017      End of Session - 08/12/17 1322    Visit Number 2   Number of Visits 7   Authorization Type Medicaid   Authorization Time Period 08/11/2017-08/31/2017   Authorization - Visit Number 1   Authorization - Number of Visits 6   SLP Start Time 9509   SLP Stop Time  1215   SLP Time Calculation (min) 51 min   Activity Tolerance Patient tolerated treatment well      Past Medical History:  Diagnosis Date  . Anxiety    takes Valium daily as needed  . Arthritis   . Back pain    hx of buldging disc  . Diabetes mellitus without complication ([place])    takes Metformin daily  . Headache    migraines  . High cholesterol    takes Zocor daily  . Hypertension    takes Benazepril and HCTZ  daily  . Joint pain   . Joint swelling   . Memory impairment    occassional - from stroke  . Myocardial infarction ([place]) 1987  . Pneumonia    hx of-80's  . PONV (postoperative nausea and vomiting)    took a while for him to wake up after previous anesthesia  . Shortness of breath dyspnea    with exertion  . Slurred speech   . Stroke ([place]) 08/2013  . TIA (transient ischemic attack) 2014   x 7   . Urinary frequency     Past Surgical History:  Procedure Laterality Date  . ANKLE SURGERY  2008   left ankle-otif-[facility]  . BACK SURGERY    . JOINT REPLACEMENT     both hips replaced   . LUMBAR LAMINECTOMY/DECOMPRESSION MICRODISCECTOMY Right 10/17/2014   Procedure: LUMBAR LAMINECTOMY/DECOMPRESSION MICRODISCECTOMY 2 LEVELS;  Surgeon: [name], MD;  Location: [facility];  Service: Neurosurgery;  Laterality: Right;  Right L45 L5S1 laminectomy and foraminotomy  . MASS  EXCISION  09/13/2012   Procedure: EXCISION MASS;  Surgeon: Jamesetta So, MD;  [facility];  Service: General;  Laterality: N/A;  . TONSILLECTOMY    . TOTAL HIP ARTHROPLASTY Right 03/20/2015  . TOTAL HIP ARTHROPLASTY Right 03/20/2015   Procedure: TOTAL HIP ARTHROPLASTY ANTERIOR APPROACH;  Surgeon: Renette Butters, MD;  Location: [facility];  Service: Orthopedics;  Laterality: Right;  . TOTAL HIP ARTHROPLASTY Left 01/08/2016   Procedure: TOTAL HIP ARTHROPLASTY ANTERIOR APPROACH;  Surgeon: Renette Butters, MD;  Location: [facility];  Service: Orthopedics;  Laterality: Left;    There were no vitals filed for this visit.      Subjective Assessment - 08/12/17 1319    Subjective "He has a hard time remembering information that happened yesterday."   Patient is accompained by: Family member   Currently in Pain? No/denies               ADULT SLP TREATMENT - 08/12/17 0001      General Information   Behavior/Cognition Alert;Cooperative;Pleasant mood   Patient Positioning Upright in chair   Oral care provided N/A   HPI Raymond Barrera  is a 56 y.o. male with h/o acute infarct in the left putamen and internal capsule posteriorly. Pt was seen for outpatient SLP  therapy for three sessions and MBSS in April/May 2018 before not returning to clinic. Pt reported continued difficulties with speech intelligibility and requested another evaluation from his PCP.     Treatment Provided   Treatment provided Cognitive-Linquistic     Pain Assessment   Pain Assessment No/denies pain     Cognitive-Linquistic Treatment   Treatment focused on Cognition;Dysarthria;Patient/family/caregiver education   Skilled Treatment breath support, speech intelligibility, and memory strategies     Progression Toward Goals   Progression toward goals Progressing toward goals          SLP Education - 08/12/17 1321    Education provided Yes   Education Details Memory strategies   Person(s) Educated Patient;Spouse    Methods Explanation;Demonstration;Verbal cues;Handout   Comprehension Verbalized understanding;Need further instruction          SLP Short Term Goals - 08/12/17 1323      SLP SHORT TERM GOAL #1   Title Pt will increase conversation level speech intelligibility to 90% by utilizing appropriate strategies and listener needing repetition no more than 3x in a given conversation.   Baseline Clarification needed x4 in 5 minute conversation   Time 3   Period Weeks   Status On-going     SLP SHORT TERM GOAL #2   Title Pt will complete functional memory activities (mod level) with 90% accuracy when given min/mod cues and use of memory strategies.   Baseline 3/5 recall of words without cues   Time 3   Period Weeks   Status On-going     SLP SHORT TERM GOAL #3   Title Pt will increase speech intelligibility to 90% accuracy when utilizing multisyllabic words in sentences given min/mod cues from clinician.   Baseline 80% intelligible given sentences to repeat   Time 3   Period Weeks   Status On-going     SLP SHORT TERM GOAL #4   Baseline 75% intelligible given sentences to repeat          SLP Long Term Goals - 08/12/17 1328      SLP LONG TERM GOAL #1   Title same as short term goals   Target Date 09/09/17          Plan - 08/12/17 1323    Clinical Impression Statement Pt accompanied to therapy by his wife this date and was alert and engaged throughout the session. Last week's evaluation and plan for treatment were reviewed with Pt/spouse. Pt continues to report feeling a "need to burp", but being unable. SLP advised that he may need a GI consult to further investigate. He was also encouraged to follow up with PCP and/or ENT if he suspects lymph swelling in his neck. Breath support techniques reviewed and Pt reports practicing at home. Speech intelligibility adequate during session with rare request for clarification. Raymond Barrera describes situations at home where Raymond Barrera is unable to  recall recent events. Memory targeted today with 10-item recall with association cue activity. Pt recalled 3/10 on first trial, 7/10 on second trial, 8/10 on third and fourth trial. He was then able to recall 7/10 words without cues. Results were reviewed with Pt and spouse. Memory strategies were provided in written form and reviewed with pt/spouse. Pt encouraged to use "focus", association, and visualization strategies to improve recall. Continue POC.    Speech Therapy Frequency 2x / week   Duration --  3 weeks   Treatment/Interventions Language facilitation;Compensatory techniques;Cueing hierarchy;Cognitive reorganization;Internal/external aids;Oral motor exercises;Functional tasks;Multimodal communcation approach;SLP instruction and feedback;Compensatory strategies;Patient/family  education   Potential to Achieve Goals Good   SLP Home Exercise Plan Continued oral motor stretches and visual reminders for compensatory strategies   Consulted and Agree with Plan of Care Patient;Family member/caregiver   Family Member Consulted Spouse      Patient will benefit from skilled therapeutic intervention in order to improve the following deficits and impairments:   Cognitive communication deficit  Dysarthria following other cerebrovascular disease    Problem List Patient Active Problem List   Diagnosis Date Noted  . Acute CVA (cerebrovascular accident) ([place]) 02/01/2017  . Diabetes mellitus ([place]) 02/01/2017  . HTN (hypertension) 02/01/2017  . Primary osteoarthritis of left hip 01/08/2016  . DJD (degenerative joint disease) 03/20/2015  . Primary osteoarthritis of right hip 02/26/2015  . Lumbar spondylosis 10/17/2014  . Lumbago 07/25/2014  . Abnormality of gait 07/25/2014  . Difficulty in walking(719.7) 07/25/2014  . TIA (transient ischemic attack) 03/09/2013  . Cocaine abuse 03/09/2013  . Accelerated hypertension 03/09/2013  . Hyperlipidemia 03/09/2013  . Current smoker 03/09/2013   Thank  you,  Genene Churn, CCC-SLP [phone]  [facility] 08/12/2017, 1:36 PM  [facility] [address] [place], [place], [address] Phone: [phone]   Fax:  [phone]   Name: Raymond Barrera MRN: [id number] Date of Birth: February 18, 1961

## 2017-08-19 ENCOUNTER — Telehealth (HOSPITAL_COMMUNITY): Payer: Self-pay | Admitting: Family Medicine

## 2017-08-19 ENCOUNTER — Ambulatory Visit (HOSPITAL_COMMUNITY): Payer: Medicaid Other | Admitting: Speech Pathology

## 2017-08-19 NOTE — Telephone Encounter (Signed)
08/19/17  Wife came in for her appt and said that he had the flu

## 2017-08-20 ENCOUNTER — Ambulatory Visit (HOSPITAL_COMMUNITY): Payer: Medicaid Other | Admitting: Speech Pathology

## 2017-08-24 ENCOUNTER — Ambulatory Visit (HOSPITAL_COMMUNITY): Payer: Medicaid Other | Admitting: Speech Pathology

## 2017-08-24 DIAGNOSIS — I69822 Dysarthria following other cerebrovascular disease: Secondary | ICD-10-CM

## 2017-08-24 DIAGNOSIS — R41841 Cognitive communication deficit: Secondary | ICD-10-CM

## 2017-08-25 ENCOUNTER — Encounter (HOSPITAL_COMMUNITY): Payer: Self-pay | Admitting: Speech Pathology

## 2017-08-25 NOTE — Therapy (Addendum)
[place], [place], [address] Phone: [phone]   Fax:  [phone]  Speech Language Pathology Treatment  Patient Details  Name: Raymond Barrera MRN: [id number] Date of Birth: 01-11-1961 Referring Provider: Lucia Gaskins  Encounter Date: 08/24/2017      End of Session - 08/25/17 2323    Visit Number 3   Number of Visits 7   Authorization Type Medicaid   Authorization Time Period 08/11/2017-08/31/2017   Authorization - Visit Number 2   Authorization - Number of Visits 6   SLP Start Time 1430   SLP Stop Time  1515   SLP Time Calculation (min) 45 min   Activity Tolerance Patient tolerated treatment well      Past Medical History:  Diagnosis Date  . Anxiety    takes Valium daily as needed  . Arthritis   . Back pain    hx of buldging disc  . Diabetes mellitus without complication ([place])    takes Metformin daily  . Headache    migraines  . High cholesterol    takes Zocor daily  . Hypertension    takes Benazepril and HCTZ  daily  . Joint pain   . Joint swelling   . Memory impairment    occassional - from stroke  . Myocardial infarction ([place]) 1987  . Pneumonia    hx of-80's  . PONV (postoperative nausea and vomiting)    took a while for him to wake up after previous anesthesia  . Shortness of breath dyspnea    with exertion  . Slurred speech   . Stroke ([place]) 08/2013  . TIA (transient ischemic attack) 2014   x 7   . Urinary frequency     Past Surgical History:  Procedure Laterality Date  . ANKLE SURGERY  2008   left ankle-otif-[facility]  . BACK SURGERY    . JOINT REPLACEMENT     both hips replaced   . LUMBAR LAMINECTOMY/DECOMPRESSION MICRODISCECTOMY Right 10/17/2014   Procedure: LUMBAR LAMINECTOMY/DECOMPRESSION MICRODISCECTOMY 2 LEVELS;  Surgeon: [name], MD;  Location: [facility];  Service: Neurosurgery;  Laterality: Right;  Right L45 L5S1 laminectomy and foraminotomy  . MASS  EXCISION  09/13/2012   Procedure: EXCISION MASS;  Surgeon: Jamesetta So, MD;  Location: [facility];  Service: General;  Laterality: N/A;  . TONSILLECTOMY    . TOTAL HIP ARTHROPLASTY Right 03/20/2015  . TOTAL HIP ARTHROPLASTY Right 03/20/2015   Procedure: TOTAL HIP ARTHROPLASTY ANTERIOR APPROACH;  Surgeon: Renette Butters, MD;  Location: [facility];  Service: Orthopedics;  Laterality: Right;  . TOTAL HIP ARTHROPLASTY Left 01/08/2016   Procedure: TOTAL HIP ARTHROPLASTY ANTERIOR APPROACH;  Surgeon: Renette Butters, MD;  Location: [facility];  Service: Orthopedics;  Laterality: Left;    There were no vitals filed for this visit.      Subjective Assessment - 08/25/17 2321    Subjective "I am feeling better."   Currently in Pain? No/denies          ADULT SLP TREATMENT - 08/25/17 0001      General Information   Behavior/Cognition Alert;Cooperative;Pleasant mood   Patient Positioning Upright in chair   Oral care provided N/A   HPI Raymond Barrera  is a 56 y.o. male with h/o acute infarct in the left putamen and internal capsule posteriorly. Pt was seen for outpatient SLP therapy for three sessions and MBSS in April/May 2018 before not returning to clinic. Pt reported continued difficulties with  speech intelligibility and requested another evaluation from his PCP.     Treatment Provided   Treatment provided Cognitive-Linquistic     Pain Assessment   Pain Assessment No/denies pain     Cognitive-Linquistic Treatment   Treatment focused on Cognition;Dysarthria;Patient/family/caregiver education   Skilled Treatment breath support, speech intelligibility, and memory strategies     Progression Toward Goals   Progression toward goals Progressing toward goals           SLP Short Term Goals - 08/25/17 2323      SLP SHORT TERM GOAL #1   Title Pt will increase conversation level speech intelligibility to 90% by utilizing appropriate strategies and listener needing repetition no more than 3x in a given  conversation.   Baseline Clarification needed x4 in 5 minute conversation   Time 3   Period Weeks   Status On-going     SLP SHORT TERM GOAL #2   Title Pt will complete functional memory activities (mod level) with 90% accuracy when given min/mod cues and use of memory strategies.   Baseline 3/5 recall of words without cues   Time 3   Period Weeks   Status On-going     SLP SHORT TERM GOAL #3   Title Pt will increase speech intelligibility to 90% accuracy when utilizing multisyllabic words in sentences given min/mod cues from clinician.   Baseline 80% intelligible given sentences to repeat   Time 3   Period Weeks   Status On-going     SLP SHORT TERM GOAL #4   Baseline 75% intelligible given sentences to repeat          SLP Long Term Goals - 08/12/17 1328      SLP LONG TERM GOAL #1   Title same as short term goals   Target Date 09/09/17          Plan - 08/25/17 2323    Clinical Impression Statement Pt unaccompanied to today's SLP appointment. Speech intelligibility and breath support WFL throughout the session with only initial cues provided for over articulation strategies. Pt described picture scenes in barrier tasks with 100% intelligibility. He was presented with eight item name recall task when given moderate cues for use of association strategies. Pt able to recall 3/8 independently after rehearsal and 8/8 with mod to mod/max cues. Continue POC.    Speech Therapy Frequency 2x / week   Duration --  3 weeks   Treatment/Interventions Language facilitation;Compensatory techniques;Cueing hierarchy;Cognitive reorganization;Internal/external aids;Oral motor exercises;Functional tasks;Multimodal communcation approach;SLP instruction and feedback;Compensatory strategies;Patient/family education   Potential to Achieve Goals Good   SLP Home Exercise Plan Continued oral motor stretches and visual reminders for compensatory strategies   Consulted and Agree with Plan of Care  Patient;Family member/caregiver   Family Member Consulted Spouse      Patient will benefit from skilled therapeutic intervention in order to improve the following deficits and impairments:   Cognitive communication deficit  Dysarthria following other cerebrovascular disease    Problem List Patient Active Problem List   Diagnosis Date Noted  . Acute CVA (cerebrovascular accident) ([place]) 02/01/2017  . Diabetes mellitus ([place]) 02/01/2017  . HTN (hypertension) 02/01/2017  . Primary osteoarthritis of left hip 01/08/2016  . DJD (degenerative joint disease) 03/20/2015  . Primary osteoarthritis of right hip 02/26/2015  . Lumbar spondylosis 10/17/2014  . Lumbago 07/25/2014  . Abnormality of gait 07/25/2014  . Difficulty in walking(719.7) 07/25/2014  . TIA (transient ischemic attack) 03/09/2013  . Cocaine abuse 03/09/2013  . Accelerated hypertension  03/09/2013  . Hyperlipidemia 03/09/2013  . Current smoker 03/09/2013   SPEECH THERAPY DISCHARGE SUMMARY  Visits from Start of Care: 3  Current functional level related to goals / functional outcomes: Limited progress due to failure to return to clinic for treatment   Remaining deficits: See above   Education / Equipment: N/A  Plan: Patient agrees to discharge.  Patient goals were partially met. Patient is being discharged due to not returning since the last visit.  ?????        Thank you,  Genene Churn, [place]  [facility] 08/24/2017, 11:25 PM  [place], [place], [address] Phone: [phone]   Fax:  [phone]   Name: Raymond Barrera MRN: [id number] Date of Birth: May 05, 1961

## 2017-08-27 ENCOUNTER — Ambulatory Visit (HOSPITAL_COMMUNITY): Payer: Medicaid Other | Admitting: Speech Pathology

## 2017-08-27 ENCOUNTER — Encounter (HOSPITAL_COMMUNITY): Payer: Self-pay | Admitting: Speech Pathology

## 2017-08-27 DIAGNOSIS — R41841 Cognitive communication deficit: Secondary | ICD-10-CM | POA: Diagnosis not present

## 2017-08-27 DIAGNOSIS — I69822 Dysarthria following other cerebrovascular disease: Secondary | ICD-10-CM

## 2017-08-27 NOTE — Therapy (Signed)
[place], [place], [address] Phone: [phone]   Fax:  [phone]  Speech Language Pathology Treatment  Patient Details  Name: Raymond Barrera MRN: [id number] Date of Birth: 1961-01-03 Referring Provider: Lucia Gaskins  Encounter Date: 08/27/2017      End of Session - 08/27/17 1644    Visit Number 4   Number of Visits 7   Authorization Type Medicaid   Authorization Time Period 08/11/2017-08/31/2017   Authorization - Visit Number 3   Authorization - Number of Visits 6   SLP Start Time 7124   SLP Stop Time  1520   SLP Time Calculation (min) 50 min   Activity Tolerance Patient tolerated treatment well      Past Medical History:  Diagnosis Date  . Anxiety    takes Valium daily as needed  . Arthritis   . Back pain    hx of buldging disc  . Diabetes mellitus without complication ([place])    takes Metformin daily  . Headache    migraines  . High cholesterol    takes Zocor daily  . Hypertension    takes Benazepril and HCTZ  daily  . Joint pain   . Joint swelling   . Memory impairment    occassional - from stroke  . Myocardial infarction ([place]) 1987  . Pneumonia    hx of-80's  . PONV (postoperative nausea and vomiting)    took a while for him to wake up after previous anesthesia  . Shortness of breath dyspnea    with exertion  . Slurred speech   . Stroke ([place]) 08/2013  . TIA (transient ischemic attack) 2014   x 7   . Urinary frequency     Past Surgical History:  Procedure Laterality Date  . ANKLE SURGERY  2008   left ankle-otif-[facility]  . BACK SURGERY    . JOINT REPLACEMENT     both hips replaced   . LUMBAR LAMINECTOMY/DECOMPRESSION MICRODISCECTOMY Right 10/17/2014   Procedure: LUMBAR LAMINECTOMY/DECOMPRESSION MICRODISCECTOMY 2 LEVELS;  Surgeon: [name], MD;  Location: [facility];  Service: Neurosurgery;  Laterality: Right;  Right L45 L5S1 laminectomy and foraminotomy  . MASS  EXCISION  09/13/2012   Procedure: EXCISION MASS;  Surgeon: Jamesetta So, MD;  [facility];  Service: General;  Laterality: N/A;  . TONSILLECTOMY    . TOTAL HIP ARTHROPLASTY Right 03/20/2015  . TOTAL HIP ARTHROPLASTY Right 03/20/2015   Procedure: TOTAL HIP ARTHROPLASTY ANTERIOR APPROACH;  Surgeon: Renette Butters, MD;  Location: [facility];  Service: Orthopedics;  Laterality: Right;  . TOTAL HIP ARTHROPLASTY Left 01/08/2016   Procedure: TOTAL HIP ARTHROPLASTY ANTERIOR APPROACH;  Surgeon: Renette Butters, MD;  Location: [facility];  Service: Orthopedics;  Laterality: Left;    There were no vitals filed for this visit.      Subjective Assessment - 08/27/17 1641    Subjective "I still have problems with my breathing."   Patient is accompained by: Family member   Currently in Pain? No/denies          ADULT SLP TREATMENT - 08/27/17 0001      General Information   Behavior/Cognition Alert;Cooperative;Pleasant mood   Patient Positioning Upright in chair   Oral care provided N/A   HPI Raymond Barrera  is a 56 y.o. male with h/o acute infarct in the left putamen and internal capsule posteriorly. Pt was seen for outpatient SLP therapy for three sessions and MBSS in April/May  2018 before not returning to clinic. Pt reported continued difficulties with speech intelligibility and requested another evaluation from his PCP.     Treatment Provided   Treatment provided Cognitive-Linquistic     Pain Assessment   Pain Assessment No/denies pain     Cognitive-Linquistic Treatment   Treatment focused on Cognition;Dysarthria;Patient/family/caregiver education   Skilled Treatment breath support, speech intelligibility, and memory strategies     Assessment / Recommendations / [place] with current plan of care          SLP Education - 08/27/17 1643    Education provided Yes   Education Details Gave list of 4 names (known but difficult for Pt to remember) to practice   Person(s) Educated  Patient;Spouse   Methods Explanation;Handout   Comprehension Verbalized understanding          SLP Short Term Goals - 08/27/17 1644      SLP SHORT TERM GOAL #1   Title Pt will increase conversation level speech intelligibility to 90% by utilizing appropriate strategies and listener needing repetition no more than 3x in a given conversation.   Baseline Clarification needed x4 in 5 minute conversation   Time 3   Period Weeks   Status On-going     SLP SHORT TERM GOAL #2   Title Pt will complete functional memory activities (mod level) with 90% accuracy when given min/mod cues and use of memory strategies.   Baseline 3/5 recall of words without cues   Time 3   Period Weeks   Status On-going     SLP SHORT TERM GOAL #3   Title Pt will increase speech intelligibility to 90% accuracy when utilizing multisyllabic words in sentences given min/mod cues from clinician.   Baseline 80% intelligible given sentences to repeat   Time 3   Period Weeks   Status On-going     SLP SHORT TERM GOAL #4   Baseline 75% intelligible given sentences to repeat          SLP Long Term Goals - 08/27/17 1645      SLP LONG TERM GOAL #1   Title same as short term goals   Target Date 09/09/17          Plan - 08/27/17 1644    Clinical Impression Statement Pt accompanied to therapy by his wife this date and was alert and engaged throughout the session. Pt and wife report that [name] breath support and speech intelligibility fluctuate. SLP commented that SLP noticed that both speech intelligibility and breath support were good during session on Monday. His wife wonders if it was improved due to his avoidance of cigarettes over the weekend (he wasn't feeling well). His wife also voiced concern about periods of (what sounds like) apnea at night while sleeping. Raymond Barrera and his wife were encouraged to speak with their primary care doctor about their concerns. I did find out that medicaid will cover for a sleep  study. Direct breathing exercises were not targeted this session, as Pt appears to have greater difficulty coordinating respiration and voicing when asked to focus on it. Instead, he responds better to cues to focus over articulation strategies. Memory skills targeted in session with SLP providing moderate cues for association strategies and recall.  Continue POC.    Speech Therapy Frequency 2x / week   Duration --  3 weeks   Treatment/Interventions Language facilitation;Compensatory techniques;Cueing hierarchy;Cognitive reorganization;Internal/external aids;Oral motor exercises;Functional tasks;Multimodal communcation approach;SLP instruction and feedback;Compensatory strategies;Patient/family education   Potential  to Achieve Goals Good   SLP Home Exercise Plan Continued oral motor stretches and visual reminders for compensatory strategies   Consulted and Agree with Plan of Care Patient;Family member/caregiver   Family Member Consulted Spouse      Patient will benefit from skilled therapeutic intervention in order to improve the following deficits and impairments:   Cognitive communication deficit  Dysarthria following other cerebrovascular disease    Problem List Patient Active Problem List   Diagnosis Date Noted  . Acute CVA (cerebrovascular accident) ([place]) 02/01/2017  . Diabetes mellitus ([place]) 02/01/2017  . HTN (hypertension) 02/01/2017  . Primary osteoarthritis of left hip 01/08/2016  . DJD (degenerative joint disease) 03/20/2015  . Primary osteoarthritis of right hip 02/26/2015  . Lumbar spondylosis 10/17/2014  . Lumbago 07/25/2014  . Abnormality of gait 07/25/2014  . Difficulty in walking(719.7) 07/25/2014  . TIA (transient ischemic attack) 03/09/2013  . Cocaine abuse 03/09/2013  . Accelerated hypertension 03/09/2013  . Hyperlipidemia 03/09/2013  . Current smoker 03/09/2013   Thank you,  Genene Churn, CCC-SLP [phone]  [facility] 08/27/2017, 4:46 PM  [place] [address] [place], [place], [address] Phone: [phone]   Fax:  [phone]   Name: Raymond Barrera MRN: [id number] Date of Birth: 11-29-1961

## 2017-08-31 ENCOUNTER — Ambulatory Visit (HOSPITAL_COMMUNITY): Payer: Medicaid Other | Admitting: Speech Pathology

## 2017-08-31 ENCOUNTER — Telehealth (HOSPITAL_COMMUNITY): Payer: Self-pay | Admitting: Family Medicine

## 2017-08-31 NOTE — Telephone Encounter (Signed)
08/31/17  wife left a message to cx saying that their apartment complex was renovating and the [facility] Lei can't get to them because the parking lot is blocked off and they can't walk up the hill to meet the [employer]

## 2017-09-03 ENCOUNTER — Ambulatory Visit (HOSPITAL_COMMUNITY): Payer: Medicaid Other | Attending: Family Medicine | Admitting: Speech Pathology

## 2017-09-10 ENCOUNTER — Ambulatory Visit (HOSPITAL_COMMUNITY): Payer: Medicaid Other | Admitting: Speech Pathology

## 2017-09-10 ENCOUNTER — Telehealth (HOSPITAL_COMMUNITY): Payer: Self-pay | Admitting: Speech Pathology

## 2017-09-10 NOTE — Telephone Encounter (Signed)
Patient's wife called and left a message tocanceled his appt

## 2017-09-15 ENCOUNTER — Ambulatory Visit (HOSPITAL_COMMUNITY): Payer: Medicaid Other | Admitting: Speech Pathology

## 2017-12-23 ENCOUNTER — Telehealth (HOSPITAL_COMMUNITY): Payer: Self-pay

## 2017-12-23 ENCOUNTER — Encounter (HOSPITAL_COMMUNITY): Payer: Self-pay

## 2017-12-23 ENCOUNTER — Ambulatory Visit (HOSPITAL_COMMUNITY): Payer: Medicaid Other | Attending: Orthopedic Surgery

## 2017-12-23 ENCOUNTER — Other Ambulatory Visit: Payer: Self-pay

## 2017-12-23 DIAGNOSIS — M25611 Stiffness of right shoulder, not elsewhere classified: Secondary | ICD-10-CM | POA: Diagnosis present

## 2017-12-23 DIAGNOSIS — M25511 Pain in right shoulder: Secondary | ICD-10-CM | POA: Diagnosis present

## 2017-12-23 DIAGNOSIS — R29898 Other symptoms and signs involving the musculoskeletal system: Secondary | ICD-10-CM | POA: Insufficient documentation

## 2017-12-23 NOTE — Patient Instructions (Signed)
Complete the following exercises 2-3 times a day.  Extension (Active) ROM: Extension (Standing)   Bring arms straight back as far as possible without pain. Repeat _10___ times per set. Do ____ sets per session. Do ____ sessions per day.  http://orth.exer.us/916   Copyright  VHI. All rights reserved.   ROM: External / Internal Rotation - in Abduction (Standing)   With upper arms parallel to floor and elbows bent at right angles, gently rotate arms up then down as far as possible without pain. Repeat _10___ times per set. Do ____ sets per session. Do ____ sessions per day.  http://orth.exer.us/912     Scapular Retraction (Standing)   With arms at sides, pinch shoulder blades together. Repeat _10___ times per set. Do ____ sets per session. Do ____ sessions per day.  http://orth.exer.us/944   Copyright  VHI. All rights reserved.       Wall Flexion  Slide your arm up the wall or door frame until a stretch is felt in your shoulder . Hold for 10-15 seconds. Complete 2 times     Shoulder Abduction Stretch  Stand side ways by a wall with affected up on wall. Gently step in toward wall to feel stretch. Hold for 10-15 seconds. Complete 2 times.

## 2017-12-23 NOTE — Therapy (Signed)
[place], [place], [address] Phone: [phone]   Fax:  [phone]  Occupational Therapy Evaluation  Patient Details  Name: Raymond Barrera MRN: [id number] Date of Birth: [birth year] Referring Provider: Dr. Tania Ade   Encounter Date: 12/23/2017  OT End of Session - 12/23/17 1228    Visit Number  1    Number of Visits  12    Date for OT Re-Evaluation  02/03/18 mini reassess: 01/20/18    Authorization Type  Medicaid -Requesting 3 initial visits on 12/23/17. Once approved request 9 additional visits.    OT Start Time  1115    OT Stop Time  1200    OT Time Calculation (min)  45 min    Activity Tolerance  Patient tolerated treatment well    Behavior During Therapy  WFL for tasks assessed/performed       Past Medical History:  Diagnosis Date  . Anxiety    takes Valium daily as needed  . Arthritis   . Back pain    hx of buldging disc  . Diabetes mellitus without complication ([place])    takes Metformin daily  . Headache    migraines  . High cholesterol    takes Zocor daily  . Hypertension    takes Benazepril and HCTZ  daily  . Joint pain   . Joint swelling   . Memory impairment    occassional - from stroke  . Myocardial infarction ([place]) 1987  . Pneumonia    hx of-80's  . PONV (postoperative nausea and vomiting)    took a while for him to wake up after previous anesthesia  . Shortness of breath dyspnea    with exertion  . Slurred speech   . Stroke ([place]) 08/2013  . TIA (transient ischemic attack) 2014   x 7   . Urinary frequency     Past Surgical History:  Procedure Laterality Date  . ANKLE SURGERY  2008   left ankle-otif-[facility]  . BACK SURGERY    . JOINT REPLACEMENT     both hips replaced   . LUMBAR LAMINECTOMY/DECOMPRESSION MICRODISCECTOMY Right 10/17/2014   Procedure: LUMBAR LAMINECTOMY/DECOMPRESSION MICRODISCECTOMY 2 LEVELS;  Surgeon: [name], MD;  Location: [facility];   Service: Neurosurgery;  Laterality: Right;  Right L45 L5S1 laminectomy and foraminotomy  . MASS EXCISION  09/13/2012   Procedure: EXCISION MASS;  Surgeon: Jamesetta So, MD;  Location: [facility];  Service: General;  Laterality: N/A;  . TONSILLECTOMY    . TOTAL HIP ARTHROPLASTY Right 03/20/2015  . TOTAL HIP ARTHROPLASTY Right 03/20/2015   Procedure: TOTAL HIP ARTHROPLASTY ANTERIOR APPROACH;  Surgeon: Renette Butters, MD;  Location: [facility];  Service: Orthopedics;  Laterality: Right;  . TOTAL HIP ARTHROPLASTY Left 01/08/2016   Procedure: TOTAL HIP ARTHROPLASTY ANTERIOR APPROACH;  Surgeon: Renette Butters, MD;  Location: [facility];  Service: Orthopedics;  Laterality: Left;    There were no vitals filed for this visit.  Subjective Assessment - 12/23/17 1121    Subjective   S: It just started to hurt and got worse over time.     Patient is accompained by:  Family member Wife    Pertinent History  Patient is a 57 y/o male S/P right shoulder tendopathy which began approximately 3-4 months ago. patient reports no known injury or cause. He did receive a shot at Dr. Bettina Gavia office although it did not improve his pain. Dr. Tamera Punt has referred patient to occupational  therapy for evaluation and treatment.     Patient Stated Goals  To be able to use right arm normally.    Currently in Pain?  Yes    Pain Score  8     Pain Location  Shoulder    Pain Orientation  Right    Pain Descriptors / Indicators  Aching    Pain Type  Acute pain    Pain Radiating Towards  up to neck    Pain Onset  More than a month ago    Pain Frequency  Intermittent    Aggravating Factors   Picking up any items, laying on it.    Pain Relieving Factors  massage, pain gel: maxfreeze    Effect of Pain on Daily Activities  min effect    Multiple Pain Sites  No        [facility] OT Assessment - 12/23/17 1124      Assessment   Medical Diagnosis  right shoulder pain    Referring Provider  Dr. Tania Ade    Onset Date/Surgical Date   -- 3-4 months    Next MD Visit  TBD    Prior Therapy  No therapy for this condition      Precautions   Precautions  None      Restrictions   Weight Bearing Restrictions  No      Balance Screen   Has the patient fallen in the past 6 months  No      Home  Environment   Lives With  Spouse      Prior Function   Level of Independence  Independent      ADL   ADL comments  Difficult reaching arm up overhead when supine and standing, donning shirts,       Mobility   Mobility Status  Independent    Mobility Status Comments  With use of single point cane      Written Expression   Dominant Hand  Right      Vision - History   Baseline Vision  Wears glasses only for reading      Cognition   Overall Cognitive Status  Within Functional Limits for tasks assessed      ROM / Strength   AROM / PROM / Strength  AROM;PROM;Strength      Palpation   Palpation comment  Max fascial restrictions in right glenohumeral joint region, trapezius and scapularis region.      AROM   Overall AROM Comments  Assessed seated. IR/er slightly abducted    AROM Assessment Site  Shoulder    Right/Left Shoulder  Right    Right Shoulder Flexion  132 Degrees    Right Shoulder ABduction  66 Degrees    Right Shoulder Internal Rotation  90 Degrees    Right Shoulder External Rotation  90 Degrees      PROM   Overall PROM Comments  Assessed supine. IR/er abducted.    PROM Assessment Site  Shoulder    Right/Left Shoulder  Right    Right Shoulder Flexion  135 Degrees    Right Shoulder ABduction  151 Degrees    Right Shoulder Internal Rotation  90 Degrees    Right Shoulder External Rotation  45 Degrees      Strength   Overall Strength Comments  Assessed seated. IR/er abducted.    Strength Assessment Site  Shoulder    Right/Left Shoulder  Right    Right Shoulder Flexion  3+/5    Right Shoulder ABduction  3+/5    Right Shoulder Internal Rotation  4+/5    Right Shoulder External Rotation  4-/5                OT Treatments/Exercises (OP) - 12/23/17 1223      Manual Therapy   Manual Therapy  Joint mobilization    Manual therapy comments  manual therapy completed prior to exercises.     Joint Mobilization  Inferior glenohumeral joint/capsule mobilization and stretch, pectoralis minor release, levator and upper trapezius stretch with subocciptal release            OT Education - 12/23/17 1227    Education provided  Yes    Education Details  Shoulder flexion and abduction stretch, extension and scapular retraction A/ROM, IR/er A/ROM.     Person(s) Educated  Patient;Spouse    Methods  Explanation;Demonstration;Verbal cues;Handout    Comprehension  Returned demonstration;Verbalized understanding       OT Short Term Goals - 12/23/17 1233      OT SHORT TERM GOAL #1   Title  Patient will be educated and independent with HEP to increase functional use of right arm during daily tasks.     Time  3    Period  Weeks    Status  New    Target Date  01/13/18      OT SHORT TERM GOAL #2   Title  Patient will increase P/ROM to WNL in order to increase ability to get shirts on and off with less difficulty.     Time  3    Period  Weeks    Status  New      OT SHORT TERM GOAL #3   Title  Patient will increase RUE shoulder strength to 4/5 overall in order to be able to return to light weight household tasks.     Time  3    Period  Weeks    Status  New      OT SHORT TERM GOAL #4   Title  Patient will report a decrease in shoulder pain of approximately 5/10 when completing daily tasks with RUE.    Time  3    Period  Weeks    Status  New      OT SHORT TERM GOAL #5   Title  patient will decrease fascial restrictions in right UE to mod amount or less in order to increase functional mobility needed to complete tasks at shoulder level.    Time  3    Period  Weeks    Status  New        OT Long Term Goals - 12/23/17 1236      OT LONG TERM GOAL #1   Title  Patient will  return to highest level of functioning while using his right hand as dominant for 75% or more tasks.     Time  6    Period  Weeks    Status  New    Target Date  02/03/18      OT LONG TERM GOAL #2   Title  Patient will increase A/ROM to [facility] to increase ability to reach overhead when standing or in bed with less difficulty.     Time  6    Period  Weeks    Status  New      OT LONG TERM GOAL #3   Title  Patient will increase RUE strength to 4+/5 in order to return to completing normal weighted household tasks.  Time  6    Period  Weeks    Status  New      OT LONG TERM GOAL #4   Title  Patient will report a decrease in pain level of approximately 3/10 or less while completing daily tasks.    Time  6    Period  Weeks    Status  New      OT LONG TERM GOAL #5   Title  Patient will decrease fascial restrictions to min amount or less in order to increase functional mobility needed to reach above shoulder level.    Time  6    Period  Weeks    Status  New            Plan - 12/23/17 1229    Clinical Impression Statement  A: Patient is a 57 y/o male S/P right shoulder tendopathy which is causing pain to run up his neck, increased pain in shoulder, fascial restrictions, and decreased strength and ROM resulting in difficulty completing daily tasks using his right arm as dominant.     Occupational Profile and client history currently impacting functional performance  Motivated to return to prior level of function, strong social support at home.    Occupational performance deficits (Please refer to evaluation for details):  ADL's;Rest and Sleep;Leisure    Rehab Potential  Excellent    Current Impairments/barriers affecting progress:  history of right shoulder injury, history of CVA with right side effected.     OT Frequency  2x / week    OT Duration  6 weeks    OT Treatment/Interventions  Self-care/ADL training;Ultrasound;Iontophoresis;DME and/or AE instruction;Patient/family  education;Passive range of motion;Cryotherapy;Electrical Stimulation;Moist Heat;Manual Therapy;Therapeutic activities;Therapeutic exercise    Plan  P: Patient will benefit from skilled OT services to increase functional use of right arm during daily tasks. Treatment plan: myofascial release, joint mobilization, passive ROM, A/ROM, general scapular and shoulder strengthening. Iontophoresis if needed.     Clinical Decision Making  Limited treatment options, no task modification necessary    Consulted and Agree with Plan of Care  Patient       Patient will benefit from skilled therapeutic intervention in order to improve the following deficits and impairments:  Decreased strength, Decreased activity tolerance, Pain, Decreased range of motion, Increased fascial restrictions, Impaired UE functional use  Visit Diagnosis: Other symptoms and signs involving the musculoskeletal system - Plan: Ot plan of care cert/re-cert  Acute pain of right shoulder - Plan: Ot plan of care cert/re-cert  Stiffness of right shoulder, not elsewhere classified - Plan: Ot plan of care cert/re-cert    Problem List Patient Active Problem List   Diagnosis Date Noted  . Acute CVA (cerebrovascular accident) ([place]) 02/01/2017  . Diabetes mellitus ([place]) 02/01/2017  . HTN (hypertension) 02/01/2017  . Primary osteoarthritis of left hip 01/08/2016  . DJD (degenerative joint disease) 03/20/2015  . Primary osteoarthritis of right hip 02/26/2015  . Lumbar spondylosis 10/17/2014  . Lumbago 07/25/2014  . Abnormality of gait 07/25/2014  . Difficulty in walking(719.7) 07/25/2014  . TIA (transient ischemic attack) 03/09/2013  . Cocaine abuse ([place]) 03/09/2013  . Accelerated hypertension 03/09/2013  . Hyperlipidemia 03/09/2013  . Current smoker 03/09/2013   Ailene Ravel, OTR/L,CBIS  [phone]  12/23/2017, 12:41 PM  [facility] [address] [place], [place], [address] Phone:  [phone]   Fax:  [phone]  Name: Raymond Barrera MRN: [id number] Date of Birth: 01-03-1961

## 2017-12-23 NOTE — Telephone Encounter (Signed)
Spoke to [place] at Dr. Bettina Gavia office she will send note to change order to OT for Shoulder eval and fax back to [place]. NF 12/23/17

## 2017-12-29 ENCOUNTER — Telehealth (HOSPITAL_COMMUNITY): Payer: Self-pay | Admitting: Family Medicine

## 2017-12-29 NOTE — Telephone Encounter (Signed)
12/29/17  wife called to cx said her husband was sick vomiting

## 2017-12-30 ENCOUNTER — Ambulatory Visit (HOSPITAL_COMMUNITY): Payer: Medicaid Other

## 2018-01-04 ENCOUNTER — Encounter (HOSPITAL_COMMUNITY): Payer: Self-pay

## 2018-01-04 ENCOUNTER — Telehealth (HOSPITAL_COMMUNITY): Payer: Self-pay | Admitting: Family Medicine

## 2018-01-04 ENCOUNTER — Ambulatory Visit (HOSPITAL_COMMUNITY): Payer: Medicaid Other | Attending: Orthopedic Surgery

## 2018-01-04 ENCOUNTER — Other Ambulatory Visit: Payer: Self-pay

## 2018-01-04 DIAGNOSIS — R29898 Other symptoms and signs involving the musculoskeletal system: Secondary | ICD-10-CM | POA: Diagnosis present

## 2018-01-04 DIAGNOSIS — M25511 Pain in right shoulder: Secondary | ICD-10-CM | POA: Diagnosis present

## 2018-01-04 NOTE — Patient Instructions (Signed)
Wall Flexion  Slide your arm up the wall or door frame until a stretch is felt in your shoulder . Hold for 10-15 seconds. Complete 2 times     Shoulder Abduction Stretch  Stand side ways by a wall with affected up on wall. Gently step in toward wall to feel stretch. Hold for 10-15 seconds. Complete 2 times.    (Home) Extension: Isometric / Bilateral Arm Retraction - Sitting   Facing anchor, hold hands and elbow at shoulder height, with elbow bent.  Pull arms back to squeeze shoulder blades together. Repeat 10-15 times. 1-3 times/day.   (Clinic) Extension / Flexion (Assist)   Face anchor, pull arms back, keeping elbow straight, and squeze shoulder blades together. Repeat 10-15 times. 1-3 times/day.   Copyright  VHI. All rights reserved.   (Home) Retraction: Row - Bilateral (Anchor)   Facing anchor, arms reaching forward, pull hands toward stomach, keeping elbows bent and at your sides and pinching shoulder blades together. Repeat 10-15 times. 1-3 times/day.   Copyright  VHI. All rights reserved.

## 2018-01-04 NOTE — Telephone Encounter (Signed)
01/04/18  therapist has a meeting to attend and changing this to 2/5 at same time -- confirmed with wife of patient

## 2018-01-04 NOTE — Therapy (Addendum)
[place], [place], [address] Phone: [phone]   Fax:  [phone]  Occupational Therapy Treatment  Patient Details  Name: Raymond Barrera MRN: [id number] Date of Birth: 22-Jul-1961 Referring Provider: Dr. Tania Ade   Encounter Date: 01/04/2018  OT End of Session - 01/04/18 1017    Visit Number  2    Number of Visits  12    Date for OT Re-Evaluation  02/03/18 mini reassess: 01/20/18    Authorization Type  Medicaid -Requesting 12 visits from Medicaid on 01/04/18.    OT Start Time  0945    Activity Tolerance  Patient tolerated treatment well    Behavior During Therapy  [facility] for tasks assessed/performed       Past Medical History:  Diagnosis Date  . Anxiety    takes Valium daily as needed  . Arthritis   . Back pain    hx of buldging disc  . Diabetes mellitus without complication ([place])    takes Metformin daily  . Headache    migraines  . High cholesterol    takes Zocor daily  . Hypertension    takes Benazepril and HCTZ  daily  . Joint pain   . Joint swelling   . Memory impairment    occassional - from stroke  . Myocardial infarction ([place]) 1987  . Pneumonia    hx of-80's  . PONV (postoperative nausea and vomiting)    took a while for him to wake up after previous anesthesia  . Shortness of breath dyspnea    with exertion  . Slurred speech   . Stroke ([place]) 08/2013  . TIA (transient ischemic attack) 2014   x 7   . Urinary frequency     Past Surgical History:  Procedure Laterality Date  . ANKLE SURGERY  2008   left ankle-otif-[facility]  . BACK SURGERY    . JOINT REPLACEMENT     both hips replaced   . LUMBAR LAMINECTOMY/DECOMPRESSION MICRODISCECTOMY Right 10/17/2014   Procedure: LUMBAR LAMINECTOMY/DECOMPRESSION MICRODISCECTOMY 2 LEVELS;  Surgeon: [name], MD;  Location: [facility];  Service: Neurosurgery;  Laterality: Right;  Right L45 L5S1 laminectomy and foraminotomy  . MASS EXCISION   09/13/2012   Procedure: EXCISION MASS;  Surgeon: Jamesetta So, MD;  Location[facility];  Service: General;  Laterality: N/A;  . TONSILLECTOMY    . TOTAL HIP ARTHROPLASTY Right 03/20/2015  . TOTAL HIP ARTHROPLASTY Right 03/20/2015   Procedure: TOTAL HIP ARTHROPLASTY ANTERIOR APPROACH;  Surgeon: Renette Butters, MD;  Location: [facility];  Service: Orthopedics;  Laterality: Right;  . TOTAL HIP ARTHROPLASTY Left 01/08/2016   Procedure: TOTAL HIP ARTHROPLASTY ANTERIOR APPROACH;  Surgeon: Renette Butters, MD;  Location: [facility];  Service: Orthopedics;  Laterality: Left;    There were no vitals filed for this visit.  Subjective Assessment - 01/04/18 0959    Currently in Pain?  Yes    Pain Score  1     Pain Location  Shoulder    Pain Orientation  Right    Pain Descriptors / Indicators  Sore    Pain Type  Acute pain    Pain Radiating Towards  up to neck    Pain Onset  More than a month ago    Pain Frequency  Intermittent    Aggravating Factors   picking up heavy items, laying on it.     Pain Relieving Factors  massage, pain geo: maxfreeze  Effect of Pain on Daily Activities  min effect    Multiple Pain Sites  No         [facility] OT Assessment - 01/04/18 1001      Assessment   Medical Diagnosis  right shoulder pain      Precautions   Precautions  None      ROM / Strength   AROM / PROM / Strength  AROM;PROM      Palpation   Palpation comment  mod fascial restrictions in right upper arm and anterior deltoid region.       AROM   Overall AROM Comments  Assessed seated. IR/er slightly abducted    AROM Assessment Site  Shoulder    Right/Left Shoulder  Right    Right Shoulder Flexion  145 Degrees previous: 132    Right Shoulder ABduction  145 Degrees previous: 66    Right Shoulder Internal Rotation  90 Degrees previous: same    Right Shoulder External Rotation  90 Degrees previous: same      Strength   Overall Strength Comments  Assessed seated. IR/er abducted.    Strength Assessment  Site  Shoulder    Right/Left Shoulder  Right    Right Shoulder Flexion  4+/5 previous: 3+/5    Right Shoulder ABduction  4+/5 previous: 3+/5    Right Shoulder Internal Rotation  4+/5 previous: same    Right Shoulder External Rotation  4+/5 previous; 4-/5               OT Treatments/Exercises (OP) - 01/04/18 1001      Exercises   Exercises  Shoulder      Shoulder Exercises: Supine   Protraction  PROM;AROM;10 reps    Horizontal ABduction  PROM;AROM;10 reps    External Rotation  PROM;AROM;10 reps    Internal Rotation  PROM;AROM;10 reps    Flexion  PROM;AROM;10 reps    ABduction  PROM;AROM;10 reps      Shoulder Exercises: Seated   Protraction  AROM;10 reps    Horizontal ABduction  AROM;10 reps    External Rotation  AROM;10 reps    Internal Rotation  AROM;10 reps    Flexion  AROM;10 reps    Abduction  AROM;10 reps      Shoulder Exercises: Standing   Extension  Theraband;10 reps    Theraband Level (Shoulder Extension)  Level 3 (Green)    Row  Theraband;10 reps    Theraband Level (Shoulder Row)  Level 3 (Green)    Retraction  Theraband;10 reps    Theraband Level (Shoulder Retraction)  Level 3 (Green)      Shoulder Exercises: ROM/Strengthening   X to V Arms  10X      Manual Therapy   Manual Therapy  Myofascial release    Manual therapy comments  manual therapy completed prior to exercises.     Myofascial Release  Myofascial release and manual stretching completed to right upper arm and trapezius region to decrease fascial restrictions and increase joint mobility in a pain free zone.              OT Education - 01/04/18 1022    Education provided  Yes    Education Details  shoulder flexion and abduction stretch. Green band for scapular strengthening.    Person(s) Educated  Patient    Methods  Explanation;Demonstration;Handout;Verbal cues    Comprehension  Returned demonstration;Verbalized understanding       OT Short Term Goals - 01/04/18 1009  OT  SHORT TERM GOAL #1   Title  Patient will be educated and independent with HEP to increase functional use of right arm during daily tasks.     Time  3    Period  Weeks    Status  Achieved      OT SHORT TERM GOAL #2   Title  Patient will increase P/ROM to WNL in order to increase ability to get shirts on and off with less difficulty.     Time  3    Period  Weeks    Status  Achieved      OT SHORT TERM GOAL #3   Title  Patient will increase RUE shoulder strength to 4/5 overall in order to be able to return to light weight household tasks.     Time  3    Period  Weeks    Status  Achieved      OT SHORT TERM GOAL #4   Title  Patient will report a decrease in shoulder pain of approximately 5/10 when completing daily tasks with RUE.    Time  3    Period  Weeks    Status  Achieved      OT SHORT TERM GOAL #5   Title  patient will decrease fascial restrictions in right UE to mod amount or less in order to increase functional mobility needed to complete tasks at shoulder level.    Time  3    Period  Weeks    Status  Achieved        OT Long Term Goals - 01/04/18 1010      OT LONG TERM GOAL #1   Title  Patient will return to highest level of functioning while using his right hand as dominant for 75% or more tasks.     Time  6    Period  Weeks    Status  On-going      OT LONG TERM GOAL #2   Title  Patient will increase A/ROM to WNL to increase ability to reach overhead when standing or in bed with less difficulty.    Baseline   *Upgraded* 01/04/18    Time  6    Period  Weeks    Status  Revised      OT LONG TERM GOAL #3   Title  Patient will increase RUE strength to 5/5 in order to return to completing normal weighted household tasks.     Baseline   *Upgraded* 01/04/18    Time  6    Period  Weeks    Status  Revised      OT LONG TERM GOAL #4   Title  Patient will report a decrease in pain level of approximately 3/10 or less while completing daily tasks 100% of the time.    Time  6     Period  Weeks    Status  Revised      OT LONG TERM GOAL #5   Title  Patient will decrease fascial restrictions to min amount or less in order to increase functional mobility needed to reach above shoulder level.    Time  6    Period  Weeks    Status  On-going            Plan - 01/04/18 1031    Clinical Impression Statement  A: Pt has made great progress since initial evaluation. A/ROM and P/ROM have both increased. Strength has increased in RUE as well. At this  point, patient has met all short term goals. patient continues to complain of pain in the shoulder joint although it is not as severe as at OT evaluation. HEP was upgraded and patient was given VC for fom and technique.     Plan  P: Attempt strengthening exercises. Continue work on increasing P/ROM during shoulder flexion. Add ball on the wall.     Consulted and Agree with Plan of Care  Patient       Patient will benefit from skilled therapeutic intervention in order to improve the following deficits and impairments:  Decreased strength, Decreased activity tolerance, Pain, Decreased range of motion, Increased fascial restrictions, Impaired UE functional use  Visit Diagnosis: Other symptoms and signs involving the musculoskeletal system  Acute pain of right shoulder    Problem List Patient Active Problem List   Diagnosis Date Noted  . Acute CVA (cerebrovascular accident) ([place]) 02/01/2017  . Diabetes mellitus ([place]) 02/01/2017  . HTN (hypertension) 02/01/2017  . Primary osteoarthritis of left hip 01/08/2016  . DJD (degenerative joint disease) 03/20/2015  . Primary osteoarthritis of right hip 02/26/2015  . Lumbar spondylosis 10/17/2014  . Lumbago 07/25/2014  . Abnormality of gait 07/25/2014  . Difficulty in walking(719.7) 07/25/2014  . TIA (transient ischemic attack) 03/09/2013  . Cocaine abuse ([place]) 03/09/2013  . Accelerated hypertension 03/09/2013  . Hyperlipidemia 03/09/2013  . Current smoker 03/09/2013    Ailene Ravel, OTR/L,CBIS  [phone] 01/04/2018, 10:46 AM  [facility] [address] [place], [place], [address] Phone: [phone]   Fax:  [phone]  Name: Raymond Barrera MRN: [id number] Date of Birth: 1961/06/08

## 2018-01-05 ENCOUNTER — Telehealth (HOSPITAL_COMMUNITY): Payer: Self-pay | Admitting: Family Medicine

## 2018-01-05 ENCOUNTER — Ambulatory Visit (HOSPITAL_COMMUNITY): Payer: Medicaid Other

## 2018-01-05 NOTE — Telephone Encounter (Signed)
01/05/18  [name] called back to say that [facility] had just received our fax at 11:25 am.  We didn't have anywhere on the schedule to see Raymond Barrera this afternoon.  His authorization runs out 2/6 and Beth was standing nearby and said that re-auth had been sent but we just haven't received back yet

## 2018-01-06 ENCOUNTER — Encounter (HOSPITAL_COMMUNITY): Payer: Self-pay | Admitting: Specialist

## 2018-01-22 ENCOUNTER — Telehealth (HOSPITAL_COMMUNITY): Payer: Self-pay | Admitting: Family Medicine

## 2018-01-22 NOTE — Telephone Encounter (Signed)
01/22/18 wife called to change all appts because she said that [facility] wouldn't bring him because of no shows that he had.  We had to begin schedule from 3/26 on.

## 2018-01-25 ENCOUNTER — Telehealth (HOSPITAL_COMMUNITY): Payer: Self-pay

## 2018-01-25 NOTE — Telephone Encounter (Signed)
Scheduled from wait list pt will try to get someone to bring them - [facility] will not bring them until March 26. NF 01/25/18

## 2018-01-26 ENCOUNTER — Ambulatory Visit (HOSPITAL_COMMUNITY): Payer: Medicaid Other

## 2018-01-28 ENCOUNTER — Encounter (HOSPITAL_COMMUNITY): Payer: Self-pay

## 2018-02-01 ENCOUNTER — Telehealth (HOSPITAL_COMMUNITY): Payer: Self-pay | Admitting: Family Medicine

## 2018-02-01 ENCOUNTER — Ambulatory Visit (HOSPITAL_COMMUNITY): Payer: Medicaid Other

## 2018-02-01 NOTE — Telephone Encounter (Signed)
02/01/18  wife left a message that they didn't have transportation today and wouldnt be here

## 2018-02-04 ENCOUNTER — Ambulatory Visit (HOSPITAL_COMMUNITY): Payer: Medicaid Other | Admitting: Specialist

## 2018-02-04 ENCOUNTER — Telehealth (HOSPITAL_COMMUNITY): Payer: Self-pay | Admitting: Family Medicine

## 2018-02-04 NOTE — Telephone Encounter (Signed)
02/04/18  wife left a message to cx she said that he is dealing with anxiety and stress and having a bad headache - he will plan to be here next week

## 2018-02-05 ENCOUNTER — Encounter (HOSPITAL_COMMUNITY): Payer: Self-pay | Admitting: Occupational Therapy

## 2018-02-08 ENCOUNTER — Encounter (HOSPITAL_COMMUNITY): Payer: Self-pay

## 2018-02-10 ENCOUNTER — Encounter (HOSPITAL_COMMUNITY): Payer: Self-pay

## 2018-02-15 ENCOUNTER — Encounter (HOSPITAL_COMMUNITY): Payer: Self-pay

## 2018-02-17 ENCOUNTER — Encounter (HOSPITAL_COMMUNITY): Payer: Self-pay

## 2018-02-23 ENCOUNTER — Ambulatory Visit (HOSPITAL_COMMUNITY): Payer: Medicaid Other

## 2018-02-23 ENCOUNTER — Telehealth (HOSPITAL_COMMUNITY): Payer: Self-pay

## 2018-02-23 ENCOUNTER — Telehealth (HOSPITAL_COMMUNITY): Payer: Self-pay | Admitting: Family Medicine

## 2018-02-23 NOTE — Telephone Encounter (Signed)
Wife called stating she got our message about NO SHOW-she states she cx on the phone tree last night. Leafy Ro stated she forgot to cx this apptment when ask.. NF 02/23/18

## 2018-02-23 NOTE — Telephone Encounter (Signed)
02/23/18  patient cx via the phone tree -- I forgot to mark cancel this morning when I looked at the list

## 2018-02-23 NOTE — Telephone Encounter (Signed)
Called and left message regarding no show. Patient was reminded of next appointment and to call if he was unable to attend.   Ailene Ravel, OTR/L,CBIS  [phone]

## 2018-02-25 ENCOUNTER — Ambulatory Visit (HOSPITAL_COMMUNITY): Payer: Medicaid Other | Attending: Orthopedic Surgery

## 2018-02-25 ENCOUNTER — Other Ambulatory Visit: Payer: Self-pay

## 2018-02-25 ENCOUNTER — Encounter (HOSPITAL_COMMUNITY): Payer: Self-pay

## 2018-02-25 DIAGNOSIS — R29898 Other symptoms and signs involving the musculoskeletal system: Secondary | ICD-10-CM | POA: Insufficient documentation

## 2018-02-25 DIAGNOSIS — M25611 Stiffness of right shoulder, not elsewhere classified: Secondary | ICD-10-CM | POA: Insufficient documentation

## 2018-02-25 DIAGNOSIS — M25511 Pain in right shoulder: Secondary | ICD-10-CM | POA: Insufficient documentation

## 2018-02-25 NOTE — Patient Instructions (Addendum)
Continue to complete every other day.    Strengthening: Chest Pull - Resisted   Hold Theraband in front of body with hands about shoulder width a part. Pull band a part and back together slowly. Repeat __12-15__ times. Complete ____ set(s) per session.. Repeat ____ session(s) per day.  http://orth.exer.us/926   Copyright  VHI. All rights reserved.   PNF Strengthening: Resisted   Standing with resistive band around each hand, bring right arm up and away, thumb back. Repeat __12-15__ times per set. Do ____ sets per session. Do ____ sessions per day.      Resisted External Rotation: in Neutral - Bilateral   Sit or stand, tubing in both hands, elbows at sides, bent to 90, forearms forward. Pinch shoulder blades together and rotate forearms out. Keep elbows at sides. Repeat _12-15___ times per set. Do ____ sets per session. Do ____ sessions per day.  http://orth.exer.us/966   Copyright  VHI. All rights reserved.   PNF Strengthening: Resisted   Standing, hold resistive band above head. Bring right arm down and out from side. Repeat __12-15__ times per set. Do ____ sets per session. Do ____ sessions per day.  http://orth.exer.us/922   Copyright  VHI. All rights reserved.   (Home) Extension: Isometric / Bilateral Arm Retraction - Sitting   Facing anchor, hold hands and elbow at shoulder height, with elbow bent.  Pull arms back to squeeze shoulder blades together. Repeat 10-15 times. 1-3 times/day.   (Clinic) Extension / Flexion (Assist)   Face anchor, pull arms back, keeping elbow straight, and squeze shoulder blades together. Repeat 10-15 times. 1-3 times/day.   Copyright  VHI. All rights reserved.   (Home) Retraction: Row - Bilateral (Anchor)   Facing anchor, arms reaching forward, pull hands toward stomach, keeping elbows bent and at your sides and pinching shoulder blades together. Repeat 10-15 times. 1-3 times/day.   Copyright  VHI. All rights  reserved.      Use tennis ball or hard ball to complete a self massage on sore or tender areas of shoulder.

## 2018-02-25 NOTE — Therapy (Signed)
[facility], [place], [address] Phone: [phone]   Fax:  [phone]  Occupational Therapy Treatment  Patient Details  Name: Raymond Barrera MRN: [id number] Date of Birth: May 11, 1961 Referring Provider: Dr. Tania Ade   Encounter Date: 02/25/2018  OT End of Session - 02/25/18 1543    Visit Number  3    Number of Visits  12        Authorization Type  Medicaid approved 12 visits (01/07/18-02/17/18)     OT Start Time  1337 No charge visit. Discharge    OT Stop Time  1410    OT Time Calculation (min)  33 min    Activity Tolerance  Patient tolerated treatment well    Behavior During Therapy  WFL for tasks assessed/performed       Past Medical History:  Diagnosis Date  . Anxiety    takes Valium daily as needed  . Arthritis   . Back pain    hx of buldging disc  . Diabetes mellitus without complication ([place])    takes Metformin daily  . Headache    migraines  . High cholesterol    takes Zocor daily  . Hypertension    takes Benazepril and HCTZ  daily  . Joint pain   . Joint swelling   . Memory impairment    occassional - from stroke  . Myocardial infarction ([place]) 1987  . Pneumonia    hx of-80's  . PONV (postoperative nausea and vomiting)    took a while for him to wake up after previous anesthesia  . Shortness of breath dyspnea    with exertion  . Slurred speech   . Stroke ([place]) 08/2013  . TIA (transient ischemic attack) 2014   x 7   . Urinary frequency     Past Surgical History:  Procedure Laterality Date  . ANKLE SURGERY  2008   left ankle-otif-[facility]  . BACK SURGERY    . JOINT REPLACEMENT     both hips replaced   . LUMBAR LAMINECTOMY/DECOMPRESSION MICRODISCECTOMY Right 10/17/2014   Procedure: LUMBAR LAMINECTOMY/DECOMPRESSION MICRODISCECTOMY 2 LEVELS;  Surgeon: [name], MD;  Location: [facility];  Service: Neurosurgery;  Laterality: Right;  Right L45 L5S1 laminectomy and  foraminotomy  . MASS EXCISION  09/13/2012   Procedure: EXCISION MASS;  Surgeon: Jamesetta So, MD;  Location: [facility];  Service: General;  Laterality: N/A;  . TONSILLECTOMY    . TOTAL HIP ARTHROPLASTY Right 03/20/2015  . TOTAL HIP ARTHROPLASTY Right 03/20/2015   Procedure: TOTAL HIP ARTHROPLASTY ANTERIOR APPROACH;  Surgeon: Renette Butters, MD;  Location: [facility];  Service: Orthopedics;  Laterality: Right;  . TOTAL HIP ARTHROPLASTY Left 01/08/2016   Procedure: TOTAL HIP ARTHROPLASTY ANTERIOR APPROACH;  Surgeon: Renette Butters, MD;  Location: [facility];  Service: Orthopedics;  Laterality: Left;    There were no vitals filed for this visit.  Subjective Assessment - 02/25/18 1538    Subjective   S: I've been working on my arm the whole time.     Currently in Pain?  No/denies         [facility] OT Assessment - 02/25/18 1342      Assessment   Medical Diagnosis  right shoulder pain      Precautions   Precautions  None      AROM   Overall AROM Comments  Assessed seated. IR/er slightly abducted    AROM Assessment Site  Shoulder  Right/Left Shoulder  Right    Right Shoulder Flexion  165 Degrees previous: 145    Right Shoulder ABduction  150 Degrees previous: 145    Right Shoulder Internal Rotation  90 Degrees previous: same    Right Shoulder External Rotation  90 Degrees previous: same      PROM   Overall PROM   Within functional limits for tasks performed    PROM Assessment Site  Shoulder    Right/Left Shoulder  Right      Strength   Overall Strength Comments  Assessed seated. IR/er abducted.    Strength Assessment Site  Shoulder    Right/Left Shoulder  Right    Right Shoulder Flexion  5/5 previous: 4+/5    Right Shoulder ABduction  5/5 previous: 4+/5    Right Shoulder Internal Rotation  5/5 previous: 4+/5    Right Shoulder External Rotation  5/5 previous: 4+/5               OT Treatments/Exercises (OP) - 02/25/18 1541      Exercises   Exercises  Shoulder       Shoulder Exercises: Seated   Horizontal ABduction  Theraband;10 reps    Theraband Level (Shoulder Horizontal ABduction)  Level 3 (Green)    External Rotation  Theraband;10 reps    Theraband Level (Shoulder External Rotation)  Level 3 (Green)    Internal Rotation  Theraband;10 reps    Theraband Level (Shoulder Internal Rotation)  Level 3 (Green)    Flexion  Theraband;10 reps    Theraband Level (Shoulder Flexion)  Level 3 (Green)    Abduction  Theraband;10 reps    Theraband Level (Shoulder ABduction)  Level 3 Nyoka Cowden)             OT Education - 02/25/18 1543    Education provided  Yes    Education Details  HEP updated. Green theraband shoulder strengthening and scapular strengthening. self myofascial release with ball.     Person(s) Educated  Patient    Methods  Explanation;Demonstration;Handout    Comprehension  Returned demonstration;Verbalized understanding       OT Short Term Goals - 01/04/18 1009      OT SHORT TERM GOAL #1   Title  Patient will be educated and independent with HEP to increase functional use of right arm during daily tasks.     Time  3    Period  Weeks    Status  Achieved      OT SHORT TERM GOAL #2   Title  Patient will increase P/ROM to WNL in order to increase ability to get shirts on and off with less difficulty.     Time  3    Period  Weeks    Status  Achieved      OT SHORT TERM GOAL #3   Title  Patient will increase RUE shoulder strength to 4/5 overall in order to be able to return to light weight household tasks.     Time  3    Period  Weeks    Status  Achieved      OT SHORT TERM GOAL #4   Title  Patient will report a decrease in shoulder pain of approximately 5/10 when completing daily tasks with RUE.    Time  3    Period  Weeks    Status  Achieved      OT SHORT TERM GOAL #5   Title  patient will decrease fascial restrictions in right UE to  mod amount or less in order to increase functional mobility needed to complete tasks at shoulder  level.    Time  3    Period  Weeks    Status  Achieved        OT Long Term Goals - 02/25/18 1401      OT LONG TERM GOAL #1   Title  Patient will return to highest level of functioning while using his right hand as dominant for 75% or more tasks.     Time  6    Period  Weeks    Status  Achieved      OT LONG TERM GOAL #2   Title  Patient will increase A/ROM to WNL to increase ability to reach overhead when standing or in bed with less difficulty.    Baseline   *Upgraded* 01/04/18    Time  6    Period  Weeks    Status  Achieved      OT LONG TERM GOAL #3   Title  Patient will increase RUE strength to 5/5 in order to return to completing normal weighted household tasks.     Baseline   *Upgraded* 01/04/18    Time  6    Period  Weeks    Status  Achieved      OT LONG TERM GOAL #4   Title  Patient will report a decrease in pain level of approximately 3/10 or less while completing daily tasks 100% of the time.    Time  6    Period  Weeks    Status  Achieved      OT LONG TERM GOAL #5   Title  Patient will decrease fascial restrictions to min amount or less in order to increase functional mobility needed to reach above shoulder level.    Time  6    Period  Weeks    Status  Achieved            Plan - 02/25/18 1546    Clinical Impression Statement  A: Pt has not been to therapy for approximately 2 months. patient returns although [facility] authorization has expired. Brief reassessment completed with patient meeting all his therapy goals. ROM and WFL and strength is 5/5. HEP was updated and therapist recommended discharge. Patient in agreement.     Plan  P: D/C from therapy with HEP.     Consulted and Agree with Plan of Care  Patient       Patient will benefit from skilled therapeutic intervention in order to improve the following deficits and impairments:  Decreased strength, Decreased activity tolerance, Pain, Decreased range of motion, Increased fascial restrictions, Impaired  UE functional use  Visit Diagnosis: Other symptoms and signs involving the musculoskeletal system  Acute pain of right shoulder  Stiffness of right shoulder, not elsewhere classified    Problem List Patient Active Problem List   Diagnosis Date Noted  . Acute CVA (cerebrovascular accident) ([place]) 02/01/2017  . Diabetes mellitus ([place]) 02/01/2017  . HTN (hypertension) 02/01/2017  . Primary osteoarthritis of left hip 01/08/2016  . DJD (degenerative joint disease) 03/20/2015  . Primary osteoarthritis of right hip 02/26/2015  . Lumbar spondylosis 10/17/2014  . Lumbago 07/25/2014  . Abnormality of gait 07/25/2014  . Difficulty in walking(719.7) 07/25/2014  . TIA (transient ischemic attack) 03/09/2013  . Cocaine abuse ([place]) 03/09/2013  . Accelerated hypertension 03/09/2013  . Hyperlipidemia 03/09/2013  . Current smoker 03/09/2013   OCCUPATIONAL THERAPY DISCHARGE SUMMARY  Visits from [facility]  of Care: 3  Current functional level related to goals / functional outcomes: See above   Remaining deficits: See above   Education / Equipment: See above Plan: Patient agrees to discharge.  Patient goals were met. Patient is being discharged due to meeting the stated rehab goals.  ?????        Ailene Ravel, OTR/L,CBIS  [phone]  02/25/2018, 3:49 PM  [place], [place], [address] Phone: [phone]   Fax:  [phone]  Name: Raymond Barrera MRN: [id number] Date of Birth: [birth year]

## 2018-03-02 ENCOUNTER — Encounter (HOSPITAL_COMMUNITY): Payer: Self-pay | Admitting: Occupational Therapy

## 2018-03-04 ENCOUNTER — Encounter (HOSPITAL_COMMUNITY): Payer: Self-pay | Admitting: Occupational Therapy

## 2018-03-09 ENCOUNTER — Encounter (HOSPITAL_COMMUNITY): Payer: Self-pay | Admitting: Occupational Therapy

## 2018-03-11 ENCOUNTER — Encounter (HOSPITAL_COMMUNITY): Payer: Self-pay

## 2018-03-11 ENCOUNTER — Other Ambulatory Visit: Payer: Self-pay | Admitting: *Deleted

## 2018-03-16 ENCOUNTER — Encounter (HOSPITAL_COMMUNITY): Payer: Self-pay | Admitting: Specialist

## 2018-03-18 ENCOUNTER — Encounter (HOSPITAL_COMMUNITY): Payer: Self-pay

## 2018-03-23 ENCOUNTER — Encounter (HOSPITAL_COMMUNITY): Payer: Self-pay | Admitting: Occupational Therapy

## 2018-03-31 ENCOUNTER — Other Ambulatory Visit: Payer: Self-pay | Admitting: *Deleted

## 2018-04-27 ENCOUNTER — Ambulatory Visit (INDEPENDENT_AMBULATORY_CARE_PROVIDER_SITE_OTHER): Payer: Medicaid Other | Admitting: Vascular Surgery

## 2018-04-27 ENCOUNTER — Encounter: Payer: Self-pay | Admitting: Vascular Surgery

## 2018-04-27 ENCOUNTER — Other Ambulatory Visit: Payer: Self-pay

## 2018-04-27 VITALS — BP 157/108 | HR 86 | Temp 97.8°F | Resp 20 | Ht 78.0 in | Wt 256.0 lb

## 2018-04-27 DIAGNOSIS — M5137 Other intervertebral disc degeneration, lumbosacral region: Secondary | ICD-10-CM | POA: Diagnosis not present

## 2018-04-27 NOTE — Progress Notes (Signed)
Vascular and Vein Specialist of [place]  Patient name: Raymond Barrera MRN: [id number] DOB: 03/28/1961 Sex: male  REASON FOR VISIT: Discuss anterior exposure for degenerative disc disease.  HPI: Raymond Barrera is a 57 y.o. male here today for discussion of anterior exposure.  He seen by myself in December 2017 for discussion of this repair.  He suffered a stroke following that and has had postponement of his surgery.  He is making good recovery and is here today with his wife for further discussion.  He continues to have difficulty with weakness in his extremities related to degenerative disc disease and urgent nerve impingement.  Is scheduled for L4-5 and L5-S1 disc surgery on 05/12/2018.  He has had no prior intra-abdominal surgery.  Has had prior back surgery from the posterior approach  Past Medical History:  Diagnosis Date  . Anxiety    takes Valium daily as needed  . Arthritis   . Back pain    hx of buldging disc  . Diabetes mellitus without complication ([place])    takes Metformin daily  . Headache    migraines  . High cholesterol    takes Zocor daily  . Hypertension    takes Benazepril and HCTZ  daily  . Joint pain   . Joint swelling   . Memory impairment    occassional - from stroke  . Myocardial infarction ([place]) 1987  . Pneumonia    hx of-80's  . PONV (postoperative nausea and vomiting)    took a while for him to wake up after previous anesthesia  . Shortness of breath dyspnea    with exertion  . Slurred speech   . Stroke ([place]) 08/2013  . TIA (transient ischemic attack) 2014   x 7   . Urinary frequency     Family History  Problem Relation Age of Onset  . Heart disease Father     SOCIAL HISTORY: Social History   Tobacco Use  . Smoking status: Current Every Day Smoker    Packs/day: 0.50    Years: 47.00    Pack years: 23.50    Types: Cigarettes  . Smokeless tobacco: Never Used  Substance Use Topics  . Alcohol use: No     Comment: quit 2012    Allergies  Allergen Reactions  . Poison Ivy Extract [Poison Ivy Extract] Itching and Rash    Current Outpatient Medications  Medication Sig Dispense Refill  . amLODipine (NORVASC) 10 MG tablet Take 10 mg by mouth daily.    Marland Kitchen aspirin 325 MG tablet Take 1 tablet (325 mg total) by mouth daily. 30 tablet 11  . benazepril (LOTENSIN) 20 MG tablet Take 20 mg by mouth daily.    Marland Kitchen labetalol (NORMODYNE) 200 MG tablet Take 200 mg by mouth 2 (two) times daily.    Marland Kitchen levofloxacin (LEVAQUIN) 750 MG tablet Take 1 tablet (750 mg total) by mouth daily. 5 tablet 0  . LORazepam (ATIVAN) 1 MG tablet Take 1 mg by mouth every evening.    . metFORMIN (GLUCOPHAGE) 500 MG tablet Take 500 mg by mouth 2 (two) times daily with a meal.    . oxyCODONE (ROXICODONE) 15 MG immediate release tablet Take 15 mg by mouth every 6 (six) hours as needed for pain.    . simvastatin (ZOCOR) 40 MG tablet Take 40 mg by mouth daily.     No current facility-administered medications for this visit.     REVIEW OF SYSTEMS:  [X]  denotes positive finding, [ ]   denotes negative finding Cardiac  Comments:  Chest pain or chest pressure:    Shortness of breath upon exertion:    Short of breath when lying flat:    Irregular heart rhythm:        Vascular    Pain in calf, thigh, or hip brought on by ambulation:    Pain in feet at night that wakes you up from your sleep:     Blood clot in your veins:    Leg swelling:           PHYSICAL EXAM: Vitals:   04/27/18 1347 04/27/18 1350  BP: (!) 167/110 (!) 157/108  Pulse: 86   Resp: 20   Temp: 97.8 F (36.6 C)   TempSrc: Oral   SpO2: 96%   Weight: 256 lb (116.1 kg)   Height: 6\' 6"  (1.981 m)     GENERAL: The patient is a well-nourished male, in no acute distress. The vital signs are documented above. CARDIOVASCULAR: Rotted arteries without bruits bilaterally.  2+ femoral and 2+ popliteal pulses. PULMONARY: There is good air exchange  MUSCULOSKELETAL:  There are no major deformities or cyanosis. NEUROLOGIC: No focal weakness or paresthesias are detected. SKIN: There are no ulcers or rashes noted. PSYCHIATRIC: The patient has a normal affect.  DATA:  CT angiogram of his chest was reviewed showing no evidence of calcification of his proximal aorta at the level of the hiatus  MEDICAL ISSUES: I again discussed my role for anterior exposure.  Discussed mobilization of intraperitoneal contents, left ureter and arterial and venous structures overlying the spine and potential injury to all these.  They understand and wish to proceed as planned    Rosetta Posner, MD [facility] of [facility] Tel [phone] Pager [phone]

## 2018-06-01 ENCOUNTER — Other Ambulatory Visit: Payer: Self-pay | Admitting: Orthopedic Surgery

## 2018-06-09 NOTE — Pre-Procedure Instructions (Signed)
Raymond Barrera  06/09/2018      [facility], [place] AT [place] [address] [place] [address] Phone: [phone] Fax: [phone]  [facility] [address] [place] [address] [place] [address] [place] [address] [place] [address] Phone: [phone] Fax: [phone]    Your procedure is scheduled on July 17  Report to [facility] at [place].M.  Call this number if you have problems the morning of surgery:  [phone]   Remember:  Do not eat or drink after midnight.      Take these medicines the morning of surgery with A SIP OF WATER  amLODipine (NORVASC) gabapentin (NEURONTIN)  labetalol (NORMODYNE) oxyCODONE-acetaminophen (PERCOCET/ROXICET)  7 days prior to surgery STOP taking any Aspirin(unless otherwise instructed by your surgeon), Aleve, Naproxen, Ibuprofen, Motrin, Advil, Goody's, BC's, all herbal medications, fish oil, and all vitamins   WHAT DO I DO ABOUT MY DIABETES MEDICATION?   Marland Kitchen Do not take oral diabetes medicines (pills) the morning of surgery. metFORMIN (GLUCOPHAGE)  How to Manage Your Diabetes Before and After Surgery  Why is it important to control my blood sugar before and after surgery? . Improving blood sugar levels before and after surgery helps healing and can limit problems. . A way of improving blood sugar control is eating a healthy diet by: o  Eating less sugar and carbohydrates o  Increasing activity/exercise o  Talking with your doctor about reaching your blood sugar goals . High blood sugars (greater than 180 mg/dL) can raise your risk of infections and slow your recovery, so you will need to focus on controlling your diabetes during the weeks before surgery. . Make sure that the doctor who takes care of your diabetes knows about your planned surgery including the date and location.  How do I manage my blood sugar before  surgery? . Check your blood sugar at least 4 times a day, starting 2 days before surgery, to make sure that the level is not too high or low. o Check your blood sugar the morning of your surgery when you wake up and every 2 hours until you get to the Short Stay unit. . If your blood sugar is less than 70 mg/dL, you will need to treat for low blood sugar: o Do not take insulin. o Treat a low blood sugar (less than 70 mg/dL) with  cup of clear juice (cranberry or apple), 4 glucose tablets, OR glucose gel. o Recheck blood sugar in 15 minutes after treatment (to make sure it is greater than 70 mg/dL). If your blood sugar is not greater than 70 mg/dL on recheck, call [phone] for further instructions. . Report your blood sugar to the short stay nurse when you get to Short Stay.  . If you are admitted to the hospital after surgery: o Your blood sugar will be checked by the staff and you will probably be given insulin after surgery (instead of oral diabetes medicines) to make sure you have good blood sugar levels. o The goal for blood sugar control after surgery is 80-180 mg/dL.    Do not wear jewelry  Do not wear lotions, powders, or cologne, or deodorant.  Men may shave face and neck.  Do not bring valuables to the hospital.  [facility] is not responsible for any belongings or valuables.  Contacts, dentures or bridgework may not be worn into surgery.  Leave  your suitcase in the car.  After surgery it may be brought to your room.  For patients admitted to the hospital, discharge time will be determined by your treatment team.  Patients discharged the day of surgery will not be allowed to drive home.    Special instructions:   [place]- Preparing For Surgery  Before surgery, you can play an important role. Because skin is not sterile, your skin needs to be as free of germs as possible. You can reduce the number of germs on your skin by washing with CHG (chlorahexidine gluconate) Soap  before surgery.  CHG is an antiseptic cleaner which kills germs and bonds with the skin to continue killing germs even after washing.    Oral Hygiene is also important to reduce your risk of infection.  Remember - BRUSH YOUR TEETH THE MORNING OF SURGERY WITH YOUR REGULAR TOOTHPASTE  Please do not use if you have an allergy to CHG or antibacterial soaps. If your skin becomes reddened/irritated stop using the CHG.  Do not shave (including legs and underarms) for at least 48 hours prior to first CHG shower. It is OK to shave your face.  Please follow these instructions carefully.   1. Shower the NIGHT BEFORE SURGERY and the MORNING OF SURGERY with CHG.   2. If you chose to wash your hair, wash your hair first as usual with your normal shampoo.  3. After you shampoo, rinse your hair and body thoroughly to remove the shampoo.  4. Use CHG as you would any other liquid soap. You can apply CHG directly to the skin and wash gently with a scrungie or a clean washcloth.   5. Apply the CHG Soap to your body ONLY FROM THE NECK DOWN.  Do not use on open wounds or open sores. Avoid contact with your eyes, ears, mouth and genitals (private parts). Wash Face and genitals (private parts)  with your normal soap.  6. Wash thoroughly, paying special attention to the area where your surgery will be performed.  7. Thoroughly rinse your body with warm water from the neck down.  8. DO NOT shower/wash with your normal soap after using and rinsing off the CHG Soap.  9. Pat yourself dry with a CLEAN TOWEL.  10. Wear CLEAN PAJAMAS to bed the night before surgery, wear comfortable clothes the morning of surgery  11. Place CLEAN SHEETS on your bed the night of your first shower and DO NOT SLEEP WITH PETS.    Day of Surgery:  Do not apply any deodorants/lotions.  Please wear clean clothes to the hospital/surgery center.   Remember to brush your teeth WITH YOUR REGULAR TOOTHPASTE.    Please read over the  following fact sheets that you were given.

## 2018-06-10 ENCOUNTER — Encounter (HOSPITAL_COMMUNITY): Payer: Self-pay

## 2018-06-10 ENCOUNTER — Encounter (HOSPITAL_COMMUNITY)
Admission: RE | Admit: 2018-06-10 | Discharge: 2018-06-10 | Disposition: A | Discharge status: Home / Self Care | Payer: Medicaid Other | Source: Ambulatory Visit | Attending: Orthopedic Surgery | Admitting: Orthopedic Surgery

## 2018-06-10 ENCOUNTER — Ambulatory Visit (HOSPITAL_COMMUNITY)
Admission: RE | Admit: 2018-06-10 | Discharge: 2018-06-10 | Disposition: A | Discharge status: Home / Self Care | Payer: Medicaid Other | Source: Ambulatory Visit | Attending: Orthopedic Surgery | Admitting: Orthopedic Surgery

## 2018-06-10 ENCOUNTER — Other Ambulatory Visit: Payer: Self-pay | Admitting: *Deleted

## 2018-06-10 ENCOUNTER — Other Ambulatory Visit: Payer: Self-pay

## 2018-06-10 DIAGNOSIS — Z01818 Encounter for other preprocedural examination: Secondary | ICD-10-CM

## 2018-06-10 DIAGNOSIS — I517 Cardiomegaly: Secondary | ICD-10-CM | POA: Insufficient documentation

## 2018-06-10 DIAGNOSIS — Z01810 Encounter for preprocedural cardiovascular examination: Secondary | ICD-10-CM | POA: Diagnosis not present

## 2018-06-10 DIAGNOSIS — R9431 Abnormal electrocardiogram [ECG] [EKG]: Secondary | ICD-10-CM | POA: Diagnosis not present

## 2018-06-10 DIAGNOSIS — J9811 Atelectasis: Secondary | ICD-10-CM | POA: Diagnosis not present

## 2018-06-10 DIAGNOSIS — Q791 Other congenital malformations of diaphragm: Secondary | ICD-10-CM | POA: Insufficient documentation

## 2018-06-10 DIAGNOSIS — J984 Other disorders of lung: Secondary | ICD-10-CM | POA: Insufficient documentation

## 2018-06-10 DIAGNOSIS — Z01812 Encounter for preprocedural laboratory examination: Secondary | ICD-10-CM | POA: Diagnosis present

## 2018-06-10 HISTORY — DX: Sleep apnea, unspecified: G47.30

## 2018-06-10 LAB — CBC WITH DIFFERENTIAL/PLATELET
ABS IMMATURE GRANULOCYTES: 0 10*3/uL (ref 0.0–0.1)
BASOS ABS: 0 10*3/uL (ref 0.0–0.1)
Basophils Relative: 0 %
EOS PCT: 2 %
Eosinophils Absolute: 0.1 10*3/uL (ref 0.0–0.7)
HCT: 43.3 % (ref 39.0–52.0)
Hemoglobin: 13.7 g/dL (ref 13.0–17.0)
Immature Granulocytes: 0 %
LYMPHS PCT: 21 %
Lymphs Abs: 1.6 10*3/uL (ref 0.7–4.0)
MCH: 28.7 pg (ref 26.0–34.0)
MCHC: 31.6 g/dL (ref 30.0–36.0)
MCV: 90.8 fL (ref 78.0–100.0)
Monocytes Absolute: 0.6 10*3/uL (ref 0.1–1.0)
Monocytes Relative: 8 %
NEUTROS ABS: 5 10*3/uL (ref 1.7–7.7)
NEUTROS PCT: 69 %
Platelets: 230 10*3/uL (ref 150–400)
RBC: 4.77 MIL/uL (ref 4.22–5.81)
RDW: 14.6 % (ref 11.5–15.5)
WBC: 7.3 10*3/uL (ref 4.0–10.5)

## 2018-06-10 LAB — URINALYSIS, ROUTINE W REFLEX MICROSCOPIC
GLUCOSE, UA: 50 mg/dL — AB
HGB URINE DIPSTICK: NEGATIVE
Ketones, ur: 5 mg/dL — AB
LEUKOCYTES UA: NEGATIVE
NITRITE: NEGATIVE
Protein, ur: 100 mg/dL — AB
SPECIFIC GRAVITY, URINE: 1.038 — AB (ref 1.005–1.030)
pH: 5 (ref 5.0–8.0)

## 2018-06-10 LAB — COMPREHENSIVE METABOLIC PANEL
ALBUMIN: 3.6 g/dL (ref 3.5–5.0)
ALK PHOS: 94 U/L (ref 38–126)
ALT: 31 U/L (ref 0–44)
AST: 26 U/L (ref 15–41)
Anion gap: 8 (ref 5–15)
BUN: 21 mg/dL — AB (ref 6–20)
CHLORIDE: 107 mmol/L (ref 98–111)
CO2: 26 mmol/L (ref 22–32)
CREATININE: 1.49 mg/dL — AB (ref 0.61–1.24)
Calcium: 10 mg/dL (ref 8.9–10.3)
GFR calc Af Amer: 58 mL/min — ABNORMAL LOW (ref >60)
GFR calc non Af Amer: 50 mL/min — ABNORMAL LOW (ref >60)
GLUCOSE: 222 mg/dL — AB (ref 70–99)
POTASSIUM: 4.3 mmol/L (ref 3.5–5.1)
SODIUM: 141 mmol/L (ref 135–145)
Total Bilirubin: 0.4 mg/dL (ref 0.3–1.2)
Total Protein: 6.6 g/dL (ref 6.5–8.1)

## 2018-06-10 LAB — GLUCOSE, CAPILLARY
Glucose-Capillary: 141 mg/dL — ABNORMAL HIGH (ref 70–99)
Glucose-Capillary: 191 mg/dL — ABNORMAL HIGH (ref 70–99)

## 2018-06-10 LAB — PROTIME-INR
INR: 0.97
Prothrombin Time: 12.8 seconds (ref 11.4–15.2)

## 2018-06-10 LAB — HEMOGLOBIN A1C
HEMOGLOBIN A1C: 6.5 % — AB (ref 4.8–5.6)
Mean Plasma Glucose: 139.85 mg/dL

## 2018-06-10 LAB — APTT: APTT: 33 s (ref 24–36)

## 2018-06-10 LAB — SURGICAL PCR SCREEN
MRSA, PCR: NEGATIVE
STAPHYLOCOCCUS AUREUS: NEGATIVE

## 2018-06-10 NOTE — Progress Notes (Addendum)
PCP: Dr. Lucia Gaskins in [place] No cardiologist No neurologist  No orders in epic for consent ( Dr. Donnetta Hutching), called office spoke to [facility].  Fasting sugars 115-120  Doesn't recall last hgb A1C  Pt. Was given no instructions when to stop aspirin. Pt./wife will call Dr. Laurena Bering office for instructions.

## 2018-06-11 NOTE — Progress Notes (Addendum)
Anesthesia Chart Review:  Case:  [id number] Date/Time:  06/16/18 0815   Procedures:      LUMBAR 4-5 LUMBAR 5-SACRUM 1 ANTERIOR LUMBAR INTERBODY FUSION WITH INSTRUMENTATION AND ALLOGRAFT (Bilateral )     ABDOMINAL EXPOSURE (N/A )   Anesthesia type:  General   Pre-op diagnosis:  BILATERAL LEG PAIN   Location:  [facility] OR ROOM 05 / [place] [facility]   Surgeon:  Phylliss Bob, MD; Early, Arvilla Meres, MD      DISCUSSION: 57 yo male scheduled for above procedure. Pertinent medical hx includes MI (1987), HTN, DM, hyperlipidemia, stroke (2014, 2018), post-op N/V. Current smoker. S/p L THA 03/20/15. S/p lumbar laminectomy 10/17/14.   He has surgical clearance from PCP Dr. Cindie Laroche 05/07/2018. We have 3 EKG tracings from Dr. Cindie Laroche. However, more recent EKGs in Epic show new T-wave inversion in lateral leads that was not present on previous tracings. I discussed this with anesthesiology and it was recommended to have Dr. Cindie Laroche review and weigh in. Unfortunately, he is out of the office until 06/15/2018. It was felt that the EKG changes were significant enough that the patient should be evaluated by cardiology prior to elective surgery. I have notified Dr. Laurena Bering scheduler Butch Penny.   ADDENDUM 06/15/2018 @ 14:51: Pt was seen and evaluated by cardiology Dr. Sallyanne Kuster on 06/14/2018. Pt underwent Lexiscan Stress and Echocardiogram on 06/15/2018.   Lexiscan results:  Nuclear stress EF: 24%.  There was no ST segment deviation noted during stress.  No T wave inversion was noted during stress.  This is a high risk study.  The left ventricular ejection fraction is severely decreased (<30%).   High risk nuclear study due to severe dilated cardiomyopathy, LVEF<30%. There is no reversible ischemia.  ECHO results: - Left ventricle: The cavity size was mildly dilated. There was   moderate concentric hypertrophy. Systolic function was severely   reduced. The estimated ejection fraction was in the range of 20%   to 25%. Severe  diffuse hypokinesis with no identifiable regional   variations. Doppler parameters are consistent with abnormal left   ventricular relaxation (grade 1 diastolic dysfunction). Acoustic   contrast opacification revealed no evidence ofthrombus. - Left atrium: The atrium was severely dilated. - Direct comparison with images from March 2018 shows little change   in left ventricular function.  06/15/2018 note from [facility] PA-C providing surgical clearance stating: "Chart reviewed as part of pre-operative protocol coverage. Given past medical history and time since last visit, based on ACC/AHA guidelines, WILBORN MEMBRENO would be at acceptable risk for the planned procedure without further cardiovascular testing. Myoview showed severe LV dysfunction which we knew but no ischemia. Per Dr Victorino December note from 06/14/18 he is an increased but accepatable risk for surgery."  I discussed the updated findings with Dr. Marcell Barlow who recommended placing A-line on DOS.  VS: BP (!) 160/97   Pulse 73   Temp 36.4 C   Resp 20   Ht 6\' 6"  (1.981 m)   Wt 261 lb 6.4 oz (118.6 kg)   SpO2 96%   BMI 30.21 kg/m   PROVIDERS: Lucia Gaskins, MD is PCP last seen 05/07/2018 for surgical clearance. It appears that he also provides cardiovascular followup.  Croitoru, Dani Gobble, MD is Cardiologist seen 06/14/2018, surgical clearance given by Kerin Ransom PA-C on 06/15/2018.  LABS: Labs reviewed: Acceptable for surgery. (all labs ordered are listed, but only abnormal results are displayed)  Labs Reviewed  GLUCOSE, CAPILLARY - Abnormal; Notable for the following components:  Result Value   Glucose-Capillary 141 (*)    All other components within normal limits  GLUCOSE, CAPILLARY - Abnormal; Notable for the following components:   Glucose-Capillary 191 (*)    All other components within normal limits  COMPREHENSIVE METABOLIC PANEL - Abnormal; Notable for the following components:   Glucose, Bld 222 (*)    BUN 21 (*)     Creatinine, Ser 1.49 (*)    GFR calc non Af Amer 50 (*)    GFR calc Af Amer 58 (*)    All other components within normal limits  URINALYSIS, ROUTINE W REFLEX MICROSCOPIC - Abnormal; Notable for the following components:   APPearance HAZY (*)    Specific Gravity, Urine 1.038 (*)    Glucose, UA 50 (*)    Bilirubin Urine MODERATE (*)    Ketones, ur 5 (*)    Protein, ur 100 (*)    Bacteria, UA MANY (*)    All other components within normal limits  HEMOGLOBIN A1C - Abnormal; Notable for the following components:   Hgb A1c MFr Bld 6.5 (*)    All other components within normal limits  SURGICAL PCR SCREEN  APTT  CBC WITH DIFFERENTIAL/PLATELET  PROTIME-INR  TYPE AND SCREEN     IMAGES: CHEST - 2 VIEW 06/10/2018  COMPARISON:  Radiograph of February 24, 2017.  FINDINGS: Stable cardiomediastinal silhouette. No pneumothorax or pleural effusion is noted. Right lung is clear. Stable elevated left hemidiaphragm is noted with mild left basilar subsegmental atelectasis or scarring. Bony thorax is unremarkable.  IMPRESSION: Stable elevated left hemidiaphragm with mild left basilar subsegmental atelectasis or scarring.    EKG: 06/10/2018: NSR, LVH, T wave abnormality, consider inferolateral ischemia, prolonged QT. New lateral t-wave inversion when compared to previous tracings. 02/24/2017: Sinus rhythm, LVH, no significant change since last tracing  CV: Carotid US 02/02/2017 IMPRESSION: 1. Minimal amount of right-sided atherosclerotic plaque, not resulting in hemodynamically significant stenosis. 2. Unremarkable sonographic evaluation of the left carotid system.   Echo 02/02/2017:  - Left ventricle: The cavity size was mildly dilated. Wall   thickness was increased in a pattern of moderate LVH. Systolic   function was moderately to severely reduced. The estimated   ejection fraction was in the range of 30% to 35%. Diffuse   hypokinesis. Doppler parameters are consistent with  abnormal left   ventricular relaxation (grade 1 diastolic dysfunction). - Aortic valve: Mildly calcified annulus. Trileaflet; mildly   thickened leaflets. Valve area (VTI): 2.47 cm^2. Valve area   (Vmax): 2.45 cm^2. - Technically adequate study.  Past Medical History:  Diagnosis Date  . Anxiety    due to the stroke  . Arthritis   . Back pain    hx of buldging disc  . Diabetes mellitus without complication ([place])    takes Metformin daily  . High cholesterol    takes Zocor daily  . Hypertension    takes Benazepril and HCTZ  daily  . Joint pain   . Joint swelling   . Memory impairment    occassional - from stroke  . Myocardial infarction ([place]) 1987  . Pneumonia    hx of-80's  . PONV (postoperative nausea and vomiting)    took a while for him to wake up after previous anesthesia  . Shortness of breath dyspnea    do to pain  . Sleep apnea    never had a sleep study,but states Dr. Cindie Laroche says he has it  . Slurred speech   . Stroke [facility])  08/2013   7 mini-strokes, last stroke 2017  . TIA (transient ischemic attack) 2014   x 7   . Urinary frequency     Past Surgical History:  Procedure Laterality Date  . ANKLE SURGERY  2008   left ankle-otif-[facility]  . BACK SURGERY    . JOINT REPLACEMENT     both hips replaced   . LUMBAR LAMINECTOMY/DECOMPRESSION MICRODISCECTOMY Right 10/17/2014   Procedure: LUMBAR LAMINECTOMY/DECOMPRESSION MICRODISCECTOMY 2 LEVELS;  Surgeon: [name], MD;  Location: [place] NEURO ORS;  Service: Neurosurgery;  Laterality: Right;  Right L45 L5S1 laminectomy and foraminotomy  . MASS EXCISION  09/13/2012   Procedure: EXCISION MASS;  Surgeon: Jamesetta So, MD;  Location: [facility];  Service: General;  Laterality: N/A;  . TONSILLECTOMY    . TOTAL HIP ARTHROPLASTY Right 03/20/2015  . TOTAL HIP ARTHROPLASTY Right 03/20/2015   Procedure: TOTAL HIP ARTHROPLASTY ANTERIOR APPROACH;  Surgeon: Renette Butters, MD;  Location: [place];  Service: Orthopedics;   Laterality: Right;  . TOTAL HIP ARTHROPLASTY Left 01/08/2016   Procedure: TOTAL HIP ARTHROPLASTY ANTERIOR APPROACH;  Surgeon: Renette Butters, MD;  Location: [place];  Service: Orthopedics;  Laterality: Left;    MEDICATIONS: . amLODipine (NORVASC) 10 MG tablet  . aspirin 325 MG tablet  . benazepril (LOTENSIN) 20 MG tablet  . ferrous gluconate (IRON 27) 240 (27 FE) MG tablet  . folic acid (FOLVITE) 366 MCG tablet  . gabapentin (NEURONTIN) 300 MG capsule  . glucose 4 GM chewable tablet  . labetalol (NORMODYNE) 200 MG tablet  . metFORMIN (GLUCOPHAGE) 500 MG tablet  . Multiple Vitamin (MULTIVITAMIN WITH MINERALS) TABS tablet  . naproxen (NAPROSYN) 500 MG tablet  . Omega-3 Fatty Acids (FISH OIL) 1000 MG CAPS  . oxyCODONE-acetaminophen (PERCOCET/ROXICET) 5-325 MG tablet  . simvastatin (ZOCOR) 40 MG tablet  . vitamin E 200 UNIT capsule   No current facility-administered medications for this encounter.      Wynonia Musty [facility] Short Stay Center/Anesthesiology Phone [phone] 06/11/2018 1:24 PM

## 2018-06-14 ENCOUNTER — Telehealth: Payer: Self-pay | Admitting: Cardiovascular Disease

## 2018-06-14 ENCOUNTER — Encounter: Payer: Self-pay | Admitting: Cardiovascular Disease

## 2018-06-14 ENCOUNTER — Ambulatory Visit: Payer: Medicaid Other | Admitting: Cardiovascular Disease

## 2018-06-14 VITALS — BP 140/92 | HR 74 | Ht 78.0 in | Wt 256.0 lb

## 2018-06-14 DIAGNOSIS — Z9189 Other specified personal risk factors, not elsewhere classified: Secondary | ICD-10-CM | POA: Diagnosis not present

## 2018-06-14 DIAGNOSIS — E119 Type 2 diabetes mellitus without complications: Secondary | ICD-10-CM

## 2018-06-14 DIAGNOSIS — I10 Essential (primary) hypertension: Secondary | ICD-10-CM | POA: Diagnosis not present

## 2018-06-14 DIAGNOSIS — E7800 Pure hypercholesterolemia, unspecified: Secondary | ICD-10-CM

## 2018-06-14 DIAGNOSIS — Z01810 Encounter for preprocedural cardiovascular examination: Secondary | ICD-10-CM

## 2018-06-14 DIAGNOSIS — R9431 Abnormal electrocardiogram [ECG] [EKG]: Secondary | ICD-10-CM | POA: Diagnosis not present

## 2018-06-14 MED ORDER — ASPIRIN 81 MG PO TABS: 81.0000 mg | ORAL_TABLET | Freq: Every day | ORAL | Status: DC

## 2018-06-14 NOTE — Telephone Encounter (Signed)
Called patient on mobile and home numbers.  No answer at either one.  I gave the directions for the Lexiscan scheduled 06-15-18 (tomorrow) at 7:30 a.m., at the [facility] office.  I left our number to call if he had any questions.  This was the only available time for the stress test.  The echo had to be rescheduled to 06-15-18 at 1 p.m.,at [facility], which I also left the date, time and location for the echo.

## 2018-06-14 NOTE — Progress Notes (Signed)
Cardiology Consultation Note:    Date:  06/14/2018   ID:  Raymond Barrera, DOB 01-27-1961, MRN [id number]  PCP:  Lucia Gaskins, MD  Cardiologist:  No primary care provider on file.   Primary MD: Lucia Gaskins, MD   Chief Complaint  Patient presents with  . Follow-up    Surgical clearance.  Raymond Barrera is a 57 y.o. male who is being seen today for the evaluation of preoperative risk evaluation and abnormal ECG at the request of Karoline Caldwell, PA-C (Anesthesiology)  History of Present Illness:    Raymond Barrera is a 57 y.o. male with a hx of long-standing hypertension and diabetes mellitus (not requiring insulin) and hypercholesterolemia (on statin) who is planning to undergo complex lumbar spine surgery with consecutive transabdominal approach and mobilization of the abdominal great vessels (Dr. Lynann Bologna, Dr. Donnetta Hutching) on July 17, posterior approach on July 18.  Preoperative ECG showed new T wave inversion in the lateral leads and he is referred for consultation.  For the last 4-6 months he has been extremely sedentary due to severe back pain.  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  In March 2018 he had a left internal capsule ischemic stroke and he has mild right hand paresis as well as some difficulty with speech and poor short-term memory.  Prior to that in 2014 he had multiple "mini strokes" in the setting of severe hypertension.    Echocardiogram performed in 2014 showed an ejection fraction of 40-45%.  An echocardiogram performed at the time of the stroke in March 2018 showed an ejection fraction that was moderate to severely depressed at 30-35%.  Left atrium was described as mildly dilated.  No serious valvular abnormalities are present.  The patient has never undergone stress testing or cardiac catheterization per his report.  On my review of a chest CT angiogram performed March 2018 (performed for "rule out pulmonary embolism"),  there is a limited coronary atherosclerotic calcification, primarily seen in the mid LAD artery.  The ascending aorta is mildly dilated at 4.2 cm.  ECG shows normal sinus rhythm with prominent voltage for left ventricular hypertrophy.  There is T wave inversion seen prominently in leads V4-V6, 1, 2, aVL.  In March 2018 the LVH changes are similar but T waves were upright in 1 and aVL.  Compared with 2017 even that ECG change, since there was no T wave inversion in STEMI leads in January 2017.  His wife states that his blood pressure has been well controlled for the last 6-7 years.  He has not had any alcohol in 30 years.  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  He is currently smoking 1/4 pack of cigarettes a day, but smoked about a pack a day for over 30 years.  Diabetes control is defined as "good" globin A1c was 6.5% on July 11.  He is taking a statin.  To the most recent lipid profile I have is from the time of his stroke in March 2018 and showed an LDL cholesterol 115, HDL 31.  I am not sure what that he was already taking a statin at this time.  It should be noted that he has had multiple surgical procedures in the last for 5 years such as hip replacement, ankle surgery and cervical spine surgery (all of them with substantially lower risk than the one that is planned the day after  tomorrow).  ECHO 02/02/2017 - Left ventricle: The cavity size was mildly dilated. Wall   thickness was increased in a pattern of moderate LVH. Systolic   function was moderately to severely reduced. The estimated   ejection fraction was in the range of 30% to 35%. Diffuse   hypokinesis. Doppler parameters are consistent with abnormal left   ventricular relaxation (grade 1 diastolic dysfunction). - Aortic valve: Mildly calcified annulus. Trileaflet; mildly   thickened leaflets. Valve area (VTI): 2.47 cm^2. Valve area   (Vmax): 2.45 cm^2. - Technically adequate study.  03/08/2013 -  Left ventricle: The cavity size was normal. There was mild concentric hypertrophy. Systolic function was mildly reduced. The estimated ejection fraction was in the range of 45% to 50%. Diffuse hypokinesis. Doppler parameters are consistent with abnormal left ventricular relaxation (grade 1 diastolic dysfunction). - Left atrium: The atrium was mildly dilated. - Right atrium: The atrium was mildly dilated. - Atrial septum: No defect or patent foramen ovale was identified.  Past Medical History:  Diagnosis Date  . Anxiety    due to the stroke  . Arthritis   . Back pain    hx of buldging disc  . Diabetes mellitus without complication ([place])    takes Metformin daily  . High cholesterol    takes Zocor daily  . Hypertension    takes Benazepril and HCTZ  daily  . Joint pain   . Joint swelling   . Memory impairment    occassional - from stroke  . Myocardial infarction ([place]) 1987  . Pneumonia    hx of-80's  . PONV (postoperative nausea and vomiting)    took a while for him to wake up after previous anesthesia  . Shortness of breath dyspnea    do to pain  . Sleep apnea    never had a sleep study,but states Dr. Cindie Laroche says he has it  . Slurred speech   . Stroke ([place]) 08/2013   7 mini-strokes, last stroke 2017  . TIA (transient ischemic attack) 2014   x 7   . Urinary frequency     Past Surgical History:  Procedure Laterality Date  . ANKLE SURGERY  2008   left ankle-otif-[facility]  . BACK SURGERY    . JOINT REPLACEMENT     both hips replaced   . LUMBAR LAMINECTOMY/DECOMPRESSION MICRODISCECTOMY Right 10/17/2014   Procedure: LUMBAR LAMINECTOMY/DECOMPRESSION MICRODISCECTOMY 2 LEVELS;  Surgeon: [name], MD;  Location: [place] NEURO ORS;  Service: Neurosurgery;  Laterality: Right;  Right L45 L5S1 laminectomy and foraminotomy  . MASS EXCISION  09/13/2012   Procedure: EXCISION MASS;  Surgeon: Jamesetta So, MD;  [facility];  Service: General;  Laterality: N/A;    . TONSILLECTOMY    . TOTAL HIP ARTHROPLASTY Right 03/20/2015  . TOTAL HIP ARTHROPLASTY Right 03/20/2015   Procedure: TOTAL HIP ARTHROPLASTY ANTERIOR APPROACH;  Surgeon: Renette Butters, MD;  Location: [place];  Service: Orthopedics;  Laterality: Right;  . TOTAL HIP ARTHROPLASTY Left 01/08/2016   Procedure: TOTAL HIP ARTHROPLASTY ANTERIOR APPROACH;  Surgeon: Renette Butters, MD;  Location: [place];  Service: Orthopedics;  Laterality: Left;    Current Medications: Current Meds  Medication Sig  . amLODipine (NORVASC) 10 MG tablet Take 10 mg by mouth daily.  Marland Kitchen aspirin 81 MG tablet Take 1 tablet (81 mg total) by mouth daily.  . benazepril (LOTENSIN) 20 MG tablet Take 20 mg by mouth daily.  . ferrous gluconate (IRON 27) 240 (27 FE) MG tablet Take  240 mg by mouth daily.  . folic acid (FOLVITE) 992 MCG tablet Take 400 mcg by mouth daily.  Marland Kitchen gabapentin (NEURONTIN) 300 MG capsule Take 300 mg by mouth 3 (three) times daily.  Marland Kitchen glucose 4 GM chewable tablet Chew 1 tablet by mouth as needed for low blood sugar.  . labetalol (NORMODYNE) 200 MG tablet Take 200 mg by mouth 2 (two) times daily.  . metFORMIN (GLUCOPHAGE) 500 MG tablet Take 500 mg by mouth 2 (two) times daily with a meal.  . Multiple Vitamin (MULTIVITAMIN WITH MINERALS) TABS tablet Take 1 tablet by mouth daily.  . naproxen (NAPROSYN) 500 MG tablet Take 500 mg by mouth 2 (two) times daily.  . Omega-3 Fatty Acids (FISH OIL) 1000 MG CAPS Take 1,000 mg by mouth daily.  Marland Kitchen oxyCODONE-acetaminophen (PERCOCET/ROXICET) 5-325 MG tablet Take 2 tablets by mouth every 4 (four) hours as needed for pain.  . simvastatin (ZOCOR) 40 MG tablet Take 40 mg by mouth daily.  . vitamin E 200 UNIT capsule Take 200 Units by mouth daily.  . [DISCONTINUED] aspirin 325 MG tablet Take 1 tablet (325 mg total) by mouth daily.     Allergies:   Poison ivy extract [poison ivy extract]   Social History   Socioeconomic History  . Marital status: Married    Spouse name: Not  on file  . Number of children: Not on file  . Years of education: Not on file  . Highest education level: Not on file  Occupational History  . Not on file  Social Needs  . Financial resource strain: Not on file  . Food insecurity:    Worry: Not on file    Inability: Not on file  . Transportation needs:    Medical: Not on file    Non-medical: Not on file  Tobacco Use  . Smoking status: Current Every Day Smoker    Packs/day: 0.25    Years: 47.00    Pack years: 11.75    Types: Cigarettes  . Smokeless tobacco: Never Used  Substance and Sexual Activity  . Alcohol use: No    Comment: quit 2012  . Drug use: Not Currently    Types: Cocaine    Comment: many yrs ago., last time- late 2016  . Sexual activity: Never  Lifestyle  . Physical activity:    Days per week: Not on file    Minutes per session: Not on file  . Stress: Not on file  Relationships  . Social connections:    Talks on phone: Not on file    Gets together: Not on file    Attends religious service: Not on file    Active member of club or organization: Not on file    Attends meetings of clubs or organizations: Not on file    Relationship status: Not on file  Other Topics Concern  . Not on file  Social History Narrative  . Not on file     Family History: The patient's family history includes Heart disease in his father.  ROS:   Please see the history of present illness.     All other systems reviewed and are negative.  EKGs/Labs/Other Studies Reviewed:    The following studies were reviewed today: Echo 2014, echo 2018, CT angiogram chest 2018, multiple progress notes from hospitalization in 2018 for stroke  EKG:  EKG is not ordered today.  The ekg ordered June 10, 2018 demonstrates sinus rhythm, LVH, repolarization abnormalities Multiple leads compared to 2018  Recent  Labs: 06/10/2018: ALT 31; BUN 21; Creatinine, Ser 1.49; Hemoglobin 13.7; Platelets 230; Potassium 4.3; Sodium 141  Recent Lipid Panel     Component Value Date/Time   CHOL 180 02/02/2017 0540   TRIG 169 (H) 02/02/2017 0540   HDL 31 (L) 02/02/2017 0540   CHOLHDL 5.8 02/02/2017 0540   VLDL 34 02/02/2017 0540   LDLCALC 115 (H) 02/02/2017 0540    Physical Exam:    VS:  BP (!) 140/92 (BP Location: Left Arm, Patient Position: Sitting, Cuff Size: Large)   Pulse 74   Ht 6\' 6"  (1.981 m)   Wt 256 lb (116.1 kg)   BMI 29.58 kg/m     Wt Readings from Last 3 Encounters:  06/14/18 256 lb (116.1 kg)  06/10/18 261 lb 6.4 oz (118.6 kg)  04/27/18 256 lb (116.1 kg)     GEN:  Well nourished, well developed in no acute distress HEENT: Normal NECK: No JVD; No carotid bruits LYMPHATICS: No lymphadenopathy CARDIAC: RRR, no murmurs, rubs, loud S4 gallop is present RESPIRATORY:  Clear to auscultation without rales, wheezing or rhonchi  ABDOMEN: Soft, non-tender, non-distended MUSCULOSKELETAL:  No edema; No deformity  SKIN: Warm and dry NEUROLOGIC:  Alert and oriented x 3 PSYCHIATRIC:  Normal affect   ASSESSMENT:    1. Abnormal ECG   2. Pre-operative cardiovascular examination   3. Multiple risk factors for coronary artery disease   4. Essential hypertension   5. Diabetes mellitus type 2 in nonobese (HCC)   6. Hypercholesterolemia    PLAN:    In order of problems listed above:  1. Preop CV evaluation: Difficult to ascertain with the data available at this time.  He has severe left ventricular systolic dysfunction that has not been evaluated and has uncertain etiology.  In a 57 year old man with diabetes, hypertension, hypercholesterolemia and active smoking, coronary insufficiency is a leading possibility even in the absence of angina pectoris.  The surgical procedure he is scheduled for this week is relatively high risk due to the anterior abdominal approach.  He should first undergo evaluation for active coronary ischemia.  We will do our best to schedule a Lexiscan Myoview study as quickly as possible and will also reevaluate  left ventricular systolic function with echocardiography. 2. LV dysfunction: Clinically he appears to be euvolemic.  He has an S4 gallop which could be attributed to either LVH or coronary ischemia, but he does not appear to be an active heart failure.  Sternal status is impossible to ascertain due to his back/orthopedic problems 3. HTN: Per the patient and family report his blood pressure is usually well controlled, but when he is in pain the blood pressure is mildly elevated, as it is today. 4. HLP: Good LDL cholesterol less than 100 (less than 70 if we identify CAD).  This was not achieved by labs in March 2018, but it is possible that he was started on statin after that. 5. DM: Controlled not insulin requiring.  Unfortunately, if we are unable to organize the [facility] and echocardiogram within the next 24 hours or if these studies show unfavorable findings, will have to cancel the procedure scheduled for Wednesday.   Medication Adjustments/Labs and Tests Ordered: Current medicines are reviewed at length with the patient today.  Concerns regarding medicines are outlined above.  Orders Placed This Encounter  Procedures  . MYOCARDIAL PERFUSION IMAGING  . ECHOCARDIOGRAM COMPLETE   Meds ordered this encounter  Medications  . aspirin 81 MG tablet    Sig:  Take 1 tablet (81 mg total) by mouth daily.    Patient Instructions  Medication Instructions: Dr Sallyanne Kuster has recommended making the following medication changes: 1. DECREASE Aspirin to 81 mg daily  Labwork: NONE ORDERED  Testing/Procedures: 1. Echocardiogram TOMORROW - Your physician has requested that you have an echocardiogram. Echocardiography is a painless test that uses sound waves to create images of your heart. It provides your doctor with information about the size and shape of your heart and how well your heart's chambers and valves are working. This procedure takes approximately one hour. There are no restrictions for  this procedure.  2. Lexiscan Myoview Stress test TOMORROW - Your physician has requested that you have a lexiscan myoview. For further information please visit [email or web address]. Please follow instruction sheet, as given.  Follow-up: Dr Sallyanne Kuster recommends that you schedule a follow-up appointment in 3 months.  If you need a refill on your cardiac medications before your next appointment, please call your pharmacy.    Signed, Sanda Klein, MD  06/14/2018 2:02 PM    [facility]

## 2018-06-14 NOTE — Patient Instructions (Addendum)
Medication Instructions: Dr Sallyanne Kuster has recommended making the following medication changes: 1. DECREASE Aspirin to 81 mg daily  Labwork: NONE ORDERED  Testing/Procedures: 1. Echocardiogram TOMORROW - Your physician has requested that you have an echocardiogram. Echocardiography is a painless test that uses sound waves to create images of your heart. It provides your doctor with information about the size and shape of your heart and how well your heart's chambers and valves are working. This procedure takes approximately one hour. There are no restrictions for this procedure.  2. Lexiscan Myoview Stress test TOMORROW - Your physician has requested that you have a lexiscan myoview. For further information please visit [email or web address]. Please follow instruction sheet, as given.  Follow-up: Dr Sallyanne Kuster recommends that you schedule a follow-up appointment in 3 months.  If you need a refill on your cardiac medications before your next appointment, please call your pharmacy.

## 2018-06-15 ENCOUNTER — Ambulatory Visit (HOSPITAL_BASED_OUTPATIENT_CLINIC_OR_DEPARTMENT_OTHER): Payer: Medicaid Other

## 2018-06-15 ENCOUNTER — Other Ambulatory Visit (HOSPITAL_COMMUNITY): Payer: Self-pay

## 2018-06-15 ENCOUNTER — Telehealth: Payer: Self-pay | Admitting: Cardiovascular Disease

## 2018-06-15 ENCOUNTER — Ambulatory Visit (HOSPITAL_BASED_OUTPATIENT_CLINIC_OR_DEPARTMENT_OTHER)
Admission: RE | Admit: 2018-06-15 | Discharge: 2018-06-15 | Disposition: A | Discharge status: Home / Self Care | Payer: Medicaid Other | Source: Ambulatory Visit | Attending: Cardiovascular Disease | Admitting: Cardiovascular Disease

## 2018-06-15 ENCOUNTER — Other Ambulatory Visit: Payer: Self-pay

## 2018-06-15 DIAGNOSIS — G4733 Obstructive sleep apnea (adult) (pediatric): Secondary | ICD-10-CM

## 2018-06-15 DIAGNOSIS — I420 Dilated cardiomyopathy: Secondary | ICD-10-CM | POA: Insufficient documentation

## 2018-06-15 DIAGNOSIS — Z9189 Other specified personal risk factors, not elsewhere classified: Secondary | ICD-10-CM

## 2018-06-15 DIAGNOSIS — Z01810 Encounter for preprocedural cardiovascular examination: Secondary | ICD-10-CM | POA: Diagnosis not present

## 2018-06-15 DIAGNOSIS — R9431 Abnormal electrocardiogram [ECG] [EKG]: Secondary | ICD-10-CM

## 2018-06-15 DIAGNOSIS — Z720 Tobacco use: Secondary | ICD-10-CM

## 2018-06-15 DIAGNOSIS — E785 Hyperlipidemia, unspecified: Secondary | ICD-10-CM

## 2018-06-15 DIAGNOSIS — I252 Old myocardial infarction: Secondary | ICD-10-CM

## 2018-06-15 DIAGNOSIS — E119 Type 2 diabetes mellitus without complications: Secondary | ICD-10-CM | POA: Insufficient documentation

## 2018-06-15 DIAGNOSIS — I10 Essential (primary) hypertension: Secondary | ICD-10-CM

## 2018-06-15 DIAGNOSIS — Z8673 Personal history of transient ischemic attack (TIA), and cerebral infarction without residual deficits: Secondary | ICD-10-CM | POA: Insufficient documentation

## 2018-06-15 LAB — ECHOCARDIOGRAM COMPLETE
HEIGHTINCHES: 78 in
WEIGHTICAEL: 4096 [oz_av]

## 2018-06-15 LAB — MYOCARDIAL PERFUSION IMAGING
CSEPPHR: 101 {beats}/min
LVDIAVOL: 301 mL (ref 62–150)
LVSYSVOL: 230 mL
RATE: 0.3
Rest HR: 78 {beats}/min
SDS: 0
SRS: 6
SSS: 6
TID: 0.91

## 2018-06-15 MED ORDER — TECHNETIUM TC 99M TETROFOSMIN IV KIT
32.6000 | PACK | Freq: Once | INTRAVENOUS | Status: AC | PRN
  Administered 2018-06-15: 32.6 via INTRAVENOUS
  Filled 2018-06-15: qty 33

## 2018-06-15 MED ORDER — REGADENOSON 0.4 MG/5ML IV SOLN
0.4000 mg | Freq: Once | INTRAVENOUS | Status: AC
  Administered 2018-06-15: 0.4 mg via INTRAVENOUS

## 2018-06-15 MED ORDER — CEFAZOLIN SODIUM-DEXTROSE 2-4 GM/100ML-% IV SOLN
2.0000 g | INTRAVENOUS | Status: AC
  Administered 2018-06-16 (×2): 2 g via INTRAVENOUS

## 2018-06-15 MED ORDER — TECHNETIUM TC 99M TETROFOSMIN IV KIT
10.3000 | PACK | Freq: Once | INTRAVENOUS | Status: AC | PRN
  Administered 2018-06-15: 10.3 via INTRAVENOUS
  Filled 2018-06-15: qty 11

## 2018-06-15 MED ORDER — PERFLUTREN LIPID MICROSPHERE
1.0000 mL | INTRAVENOUS | Status: AC | PRN
  Administered 2018-06-15: 2 mL via INTRAVENOUS

## 2018-06-15 MED ORDER — CEFAZOLIN SODIUM-DEXTROSE 2-4 GM/100ML-% IV SOLN: 2.0000 g | INTRAVENOUS | Status: DC

## 2018-06-15 NOTE — Telephone Encounter (Signed)
New message:     [facility] is calling to check on the status of the patient's clearance. Pt saw Dr. Loletha Grayer on yesterday and is having a f/u echo and stress test today. She states they need to know asap if the pt can have this surgery tomorrow morning.

## 2018-06-15 NOTE — Telephone Encounter (Signed)
   Primary Cardiologist: Sanda Klein, MD  Chart reviewed as part of pre-operative protocol coverage. Given past medical history and time since last visit, based on ACC/AHA guidelines, ZIYON SOLTAU would be at acceptable risk for the planned procedure without further cardiovascular testing.   Myoview showed severe LV dysfunction which we knew but no ischemia. Per Dr Victorino December note from 06/14/18 he is an increased but accepatable risk for surgery.   I will route this recommendation to the requesting party via Epic fax function and remove from pre-op pool.  Please call with questions.  Kerin Ransom, PA-C 06/15/2018, 2:29 PM

## 2018-06-16 ENCOUNTER — Encounter (HOSPITAL_COMMUNITY): Payer: Self-pay | Admitting: Surgery

## 2018-06-16 ENCOUNTER — Other Ambulatory Visit: Payer: Self-pay

## 2018-06-16 ENCOUNTER — Inpatient Hospital Stay (HOSPITAL_COMMUNITY): Payer: Medicaid Other

## 2018-06-16 ENCOUNTER — Encounter (HOSPITAL_COMMUNITY)
Admission: RE | Disposition: A | Discharge status: Home Health | Payer: Self-pay | Source: Home / Self Care | Attending: Orthopedic Surgery

## 2018-06-16 ENCOUNTER — Inpatient Hospital Stay (HOSPITAL_COMMUNITY): Payer: Medicaid Other | Admitting: Anesthesiology

## 2018-06-16 ENCOUNTER — Inpatient Hospital Stay (HOSPITAL_COMMUNITY): Payer: Medicaid Other | Admitting: Physician Assistant

## 2018-06-16 ENCOUNTER — Inpatient Hospital Stay (HOSPITAL_COMMUNITY)
Admission: RE | Admit: 2018-06-16 | Discharge: 2018-06-19 | DRG: 455 | Disposition: A | Discharge status: Home Health | Payer: Medicaid Other | Attending: Orthopedic Surgery | Admitting: Orthopedic Surgery

## 2018-06-16 DIAGNOSIS — E7800 Pure hypercholesterolemia, unspecified: Secondary | ICD-10-CM | POA: Diagnosis not present

## 2018-06-16 DIAGNOSIS — M5117 Intervertebral disc disorders with radiculopathy, lumbosacral region: Secondary | ICD-10-CM | POA: Diagnosis present

## 2018-06-16 DIAGNOSIS — Z791 Long term (current) use of non-steroidal anti-inflammatories (NSAID): Secondary | ICD-10-CM

## 2018-06-16 DIAGNOSIS — Z7984 Long term (current) use of oral hypoglycemic drugs: Secondary | ICD-10-CM

## 2018-06-16 DIAGNOSIS — E119 Type 2 diabetes mellitus without complications: Secondary | ICD-10-CM | POA: Diagnosis not present

## 2018-06-16 DIAGNOSIS — Z8673 Personal history of transient ischemic attack (TIA), and cerebral infarction without residual deficits: Secondary | ICD-10-CM | POA: Diagnosis not present

## 2018-06-16 DIAGNOSIS — Z7982 Long term (current) use of aspirin: Secondary | ICD-10-CM

## 2018-06-16 DIAGNOSIS — Z79899 Other long term (current) drug therapy: Secondary | ICD-10-CM

## 2018-06-16 DIAGNOSIS — I10 Essential (primary) hypertension: Secondary | ICD-10-CM | POA: Diagnosis present

## 2018-06-16 DIAGNOSIS — M5416 Radiculopathy, lumbar region: Secondary | ICD-10-CM

## 2018-06-16 DIAGNOSIS — M5440 Lumbago with sciatica, unspecified side: Secondary | ICD-10-CM

## 2018-06-16 DIAGNOSIS — M79605 Pain in left leg: Secondary | ICD-10-CM | POA: Diagnosis present

## 2018-06-16 DIAGNOSIS — Z23 Encounter for immunization: Secondary | ICD-10-CM

## 2018-06-16 DIAGNOSIS — M4807 Spinal stenosis, lumbosacral region: Secondary | ICD-10-CM | POA: Diagnosis present

## 2018-06-16 DIAGNOSIS — F17210 Nicotine dependence, cigarettes, uncomplicated: Secondary | ICD-10-CM | POA: Diagnosis not present

## 2018-06-16 DIAGNOSIS — M48061 Spinal stenosis, lumbar region without neurogenic claudication: Secondary | ICD-10-CM | POA: Diagnosis present

## 2018-06-16 DIAGNOSIS — M5116 Intervertebral disc disorders with radiculopathy, lumbar region: Principal | ICD-10-CM | POA: Diagnosis present

## 2018-06-16 DIAGNOSIS — Z419 Encounter for procedure for purposes other than remedying health state, unspecified: Secondary | ICD-10-CM

## 2018-06-16 DIAGNOSIS — M5417 Radiculopathy, lumbosacral region: Secondary | ICD-10-CM | POA: Diagnosis not present

## 2018-06-16 DIAGNOSIS — I252 Old myocardial infarction: Secondary | ICD-10-CM | POA: Diagnosis not present

## 2018-06-16 DIAGNOSIS — Z96643 Presence of artificial hip joint, bilateral: Secondary | ICD-10-CM | POA: Diagnosis not present

## 2018-06-16 DIAGNOSIS — M5410 Radiculopathy, site unspecified: Secondary | ICD-10-CM | POA: Diagnosis present

## 2018-06-16 DIAGNOSIS — M47816 Spondylosis without myelopathy or radiculopathy, lumbar region: Secondary | ICD-10-CM

## 2018-06-16 HISTORY — PX: ANTERIOR LUMBAR FUSION: SHX1170

## 2018-06-16 HISTORY — PX: ABDOMINAL EXPOSURE: SHX5708

## 2018-06-16 LAB — GLUCOSE, CAPILLARY
GLUCOSE-CAPILLARY: 158 mg/dL — AB (ref 70–99)
GLUCOSE-CAPILLARY: 186 mg/dL — AB (ref 70–99)
GLUCOSE-CAPILLARY: 178 mg/dL — AB (ref 70–99)
Glucose-Capillary: 116 mg/dL — ABNORMAL HIGH (ref 70–99)
Glucose-Capillary: 172 mg/dL — ABNORMAL HIGH (ref 70–99)
Glucose-Capillary: 139 mg/dL — ABNORMAL HIGH (ref 70–99)

## 2018-06-16 LAB — TROPONIN I: Troponin I: <0.03 ng/mL (ref <0.03)

## 2018-06-16 SURGERY — ANTERIOR LUMBAR FUSION 2 LEVELS
Anesthesia: General

## 2018-06-16 MED ORDER — CHLORHEXIDINE GLUCONATE 4 % EX LIQD: 60.0000 mL | Freq: Once | CUTANEOUS | Status: DC

## 2018-06-16 MED ORDER — SODIUM CHLORIDE 0.9 % IV SOLN: 250.0000 mL | INTRAVENOUS | Status: DC

## 2018-06-16 MED ORDER — ONDANSETRON HCL 4 MG PO TABS: 4.0000 mg | ORAL_TABLET | Freq: Four times a day (QID) | ORAL | Status: DC | PRN

## 2018-06-16 MED ORDER — ALUM & MAG HYDROXIDE-SIMETH 200-200-20 MG/5ML PO SUSP
30.0000 mL | Freq: Four times a day (QID) | ORAL | Status: DC | PRN
Start: 2018-06-16 — End: 2018-06-19

## 2018-06-16 MED ORDER — SUGAMMADEX SODIUM 500 MG/5ML IV SOLN
INTRAVENOUS | Status: AC
  Filled 2018-06-16: qty 5

## 2018-06-16 MED ORDER — POVIDONE-IODINE 7.5 % EX SOLN
Freq: Once | CUTANEOUS | Status: DC
  Filled 2018-06-16: qty 118

## 2018-06-16 MED ORDER — DEXAMETHASONE SODIUM PHOSPHATE 10 MG/ML IJ SOLN
INTRAMUSCULAR | Status: AC
  Filled 2018-06-16: qty 1

## 2018-06-16 MED ORDER — EPHEDRINE SULFATE 50 MG/ML IJ SOLN
INTRAMUSCULAR | Status: AC
  Filled 2018-06-16: qty 1

## 2018-06-16 MED ORDER — LACTATED RINGERS IV SOLN
INTRAVENOUS | Status: DC | PRN
  Administered 2018-06-16: 09:00:00 via INTRAVENOUS

## 2018-06-16 MED ORDER — BUPIVACAINE-EPINEPHRINE (PF) 0.25% -1:200000 IJ SOLN
INTRAMUSCULAR | Status: DC | PRN
  Administered 2018-06-16: 30 mL via PERINEURAL

## 2018-06-16 MED ORDER — ETOMIDATE 2 MG/ML IV SOLN
INTRAVENOUS | Status: DC | PRN
  Administered 2018-06-16: 12 mg via INTRAVENOUS
  Administered 2018-06-16: 2 mg via INTRAVENOUS

## 2018-06-16 MED ORDER — LACTATED RINGERS IV SOLN: INTRAVENOUS | Status: DC

## 2018-06-16 MED ORDER — PROPOFOL 10 MG/ML IV BOLUS
INTRAVENOUS | Status: AC
  Filled 2018-06-16: qty 20

## 2018-06-16 MED ORDER — VANCOMYCIN HCL 500 MG IV SOLR
INTRAVENOUS | Status: DC | PRN
  Administered 2018-06-16: 500 mg via TOPICAL

## 2018-06-16 MED ORDER — 0.9 % SODIUM CHLORIDE (POUR BTL) OPTIME
TOPICAL | Status: DC | PRN
  Administered 2018-06-16 (×3): 1000 mL

## 2018-06-16 MED ORDER — PANTOPRAZOLE SODIUM 40 MG PO TBEC
40.0000 mg | DELAYED_RELEASE_TABLET | Freq: Every day | ORAL | Status: DC
  Administered 2018-06-18 – 2018-06-19 (×2): 40 mg via ORAL
  Filled 2018-06-16 (×2): qty 1

## 2018-06-16 MED ORDER — LIDOCAINE 2% (20 MG/ML) 5 ML SYRINGE
INTRAMUSCULAR | Status: DC | PRN
  Administered 2018-06-16: 100 mg via INTRAVENOUS

## 2018-06-16 MED ORDER — GLYCOPYRROLATE 0.2 MG/ML IJ SOLN
INTRAMUSCULAR | Status: DC | PRN
  Administered 2018-06-16: 0.2 mg via INTRAVENOUS

## 2018-06-16 MED ORDER — ACETAMINOPHEN 650 MG RE SUPP: 650.0000 mg | RECTAL | Status: DC | PRN

## 2018-06-16 MED ORDER — EPHEDRINE SULFATE 50 MG/ML IJ SOLN
INTRAMUSCULAR | Status: DC | PRN
  Administered 2018-06-16 (×4): 5 mg via INTRAVENOUS
  Administered 2018-06-16: 10 mg via INTRAVENOUS

## 2018-06-16 MED ORDER — DOCUSATE SODIUM 100 MG PO CAPS
100.0000 mg | ORAL_CAPSULE | Freq: Two times a day (BID) | ORAL | Status: DC
  Administered 2018-06-16 – 2018-06-19 (×5): 100 mg via ORAL
  Filled 2018-06-16 (×5): qty 1

## 2018-06-16 MED ORDER — SUGAMMADEX SODIUM 500 MG/5ML IV SOLN
INTRAVENOUS | Status: DC | PRN
  Administered 2018-06-16: 250 mg via INTRAVENOUS

## 2018-06-16 MED ORDER — ALBUMIN HUMAN 5 % IV SOLN
INTRAVENOUS | Status: DC | PRN
  Administered 2018-06-16: 11:00:00 via INTRAVENOUS

## 2018-06-16 MED ORDER — METOCLOPRAMIDE HCL 5 MG/ML IJ SOLN: 10.0000 mg | Freq: Once | INTRAMUSCULAR | Status: DC | PRN

## 2018-06-16 MED ORDER — AMLODIPINE BESYLATE 10 MG PO TABS
10.0000 mg | ORAL_TABLET | Freq: Every day | ORAL | Status: DC
  Administered 2018-06-18 – 2018-06-19 (×2): 10 mg via ORAL
  Filled 2018-06-16 (×2): qty 1

## 2018-06-16 MED ORDER — HYDRALAZINE HCL 20 MG/ML IJ SOLN
INTRAMUSCULAR | Status: DC | PRN
  Administered 2018-06-16: 5 mg via INTRAVENOUS

## 2018-06-16 MED ORDER — FOLIC ACID 400 MCG PO TABS: 400.0000 ug | ORAL_TABLET | Freq: Every day | ORAL | Status: DC

## 2018-06-16 MED ORDER — FENTANYL CITRATE (PF) 100 MCG/2ML IJ SOLN
25.0000 ug | INTRAMUSCULAR | Status: DC | PRN
  Administered 2018-06-16 (×2): 50 ug via INTRAVENOUS

## 2018-06-16 MED ORDER — DIAZEPAM 5 MG PO TABS
5.0000 mg | ORAL_TABLET | Freq: Four times a day (QID) | ORAL | Status: DC | PRN
  Administered 2018-06-16 – 2018-06-17 (×3): 5 mg via ORAL
  Filled 2018-06-16 (×3): qty 1

## 2018-06-16 MED ORDER — GLUCOSE 4 G PO CHEW: 1.0000 | CHEWABLE_TABLET | ORAL | Status: DC | PRN

## 2018-06-16 MED ORDER — FENTANYL CITRATE (PF) 100 MCG/2ML IJ SOLN
INTRAMUSCULAR | Status: AC
  Administered 2018-06-16: 50 ug via INTRAVENOUS
  Filled 2018-06-16: qty 2

## 2018-06-16 MED ORDER — SODIUM CHLORIDE 0.9 % IV SOLN
INTRAVENOUS | Status: DC | PRN
  Administered 2018-06-16: 30 ug/min via INTRAVENOUS

## 2018-06-16 MED ORDER — BENAZEPRIL HCL 20 MG PO TABS
20.0000 mg | ORAL_TABLET | Freq: Every day | ORAL | Status: DC
  Administered 2018-06-18 – 2018-06-19 (×2): 20 mg via ORAL
  Filled 2018-06-16 (×2): qty 1

## 2018-06-16 MED ORDER — DEXAMETHASONE SODIUM PHOSPHATE 10 MG/ML IJ SOLN
INTRAMUSCULAR | Status: DC | PRN
  Administered 2018-06-16: 4 mg via INTRAVENOUS

## 2018-06-16 MED ORDER — MIDAZOLAM HCL 2 MG/2ML IJ SOLN
INTRAMUSCULAR | Status: DC | PRN
  Administered 2018-06-16 (×2): 1 mg via INTRAVENOUS

## 2018-06-16 MED ORDER — THROMBIN (RECOMBINANT) 20000 UNITS EX SOLR
CUTANEOUS | Status: AC
  Filled 2018-06-16: qty 20000

## 2018-06-16 MED ORDER — FERROUS GLUCONATE 240 (27 FE) MG PO TABS: 240.0000 mg | ORAL_TABLET | Freq: Every day | ORAL | Status: DC

## 2018-06-16 MED ORDER — FENTANYL CITRATE (PF) 250 MCG/5ML IJ SOLN
INTRAMUSCULAR | Status: AC
  Filled 2018-06-16: qty 5

## 2018-06-16 MED ORDER — ZOLPIDEM TARTRATE 5 MG PO TABS
5.0000 mg | ORAL_TABLET | Freq: Every evening | ORAL | Status: DC | PRN
  Administered 2018-06-16 – 2018-06-19 (×2): 5 mg via ORAL
  Filled 2018-06-16 (×2): qty 1

## 2018-06-16 MED ORDER — ONDANSETRON HCL 4 MG/2ML IJ SOLN
INTRAMUSCULAR | Status: AC
  Filled 2018-06-16: qty 2

## 2018-06-16 MED ORDER — SUCCINYLCHOLINE CHLORIDE 200 MG/10ML IV SOSY
PREFILLED_SYRINGE | INTRAVENOUS | Status: AC
  Filled 2018-06-16: qty 10

## 2018-06-16 MED ORDER — ONDANSETRON HCL 4 MG/2ML IJ SOLN
INTRAMUSCULAR | Status: DC | PRN
  Administered 2018-06-16: 4 mg via INTRAVENOUS

## 2018-06-16 MED ORDER — FENTANYL CITRATE (PF) 250 MCG/5ML IJ SOLN
INTRAMUSCULAR | Status: DC | PRN
  Administered 2018-06-16: 100 ug via INTRAVENOUS
  Administered 2018-06-16 (×3): 50 ug via INTRAVENOUS

## 2018-06-16 MED ORDER — ACETAMINOPHEN 325 MG PO TABS: 650.0000 mg | ORAL_TABLET | ORAL | Status: DC | PRN

## 2018-06-16 MED ORDER — ROCURONIUM BROMIDE 10 MG/ML (PF) SYRINGE
PREFILLED_SYRINGE | INTRAVENOUS | Status: AC
  Filled 2018-06-16: qty 10

## 2018-06-16 MED ORDER — SODIUM CHLORIDE 0.9% FLUSH
3.0000 mL | Freq: Two times a day (BID) | INTRAVENOUS | Status: DC
  Administered 2018-06-16 – 2018-06-19 (×4): 3 mL via INTRAVENOUS

## 2018-06-16 MED ORDER — ROCURONIUM BROMIDE 10 MG/ML (PF) SYRINGE
PREFILLED_SYRINGE | INTRAVENOUS | Status: DC | PRN
  Administered 2018-06-16: 30 mg via INTRAVENOUS
  Administered 2018-06-16 (×2): 20 mg via INTRAVENOUS
  Administered 2018-06-16: 100 mg via INTRAVENOUS
  Administered 2018-06-16: 30 mg via INTRAVENOUS

## 2018-06-16 MED ORDER — ESMOLOL HCL 100 MG/10ML IV SOLN
INTRAVENOUS | Status: DC | PRN
  Administered 2018-06-16: 50 mg via INTRAVENOUS

## 2018-06-16 MED ORDER — ADULT MULTIVITAMIN W/MINERALS CH: 1.0000 | ORAL_TABLET | Freq: Every day | ORAL | Status: DC

## 2018-06-16 MED ORDER — SODIUM CHLORIDE 0.9 % IJ SOLN
INTRAMUSCULAR | Status: AC
  Filled 2018-06-16: qty 10

## 2018-06-16 MED ORDER — VANCOMYCIN HCL 500 MG IV SOLR
INTRAVENOUS | Status: AC
  Filled 2018-06-16: qty 500

## 2018-06-16 MED ORDER — POTASSIUM CHLORIDE IN NACL 20-0.9 MEQ/L-% IV SOLN
INTRAVENOUS | Status: DC
  Administered 2018-06-16 – 2018-06-17 (×2): via INTRAVENOUS
  Filled 2018-06-16: qty 1000

## 2018-06-16 MED ORDER — BUPIVACAINE-EPINEPHRINE (PF) 0.25% -1:200000 IJ SOLN
INTRAMUSCULAR | Status: AC
  Filled 2018-06-16: qty 30

## 2018-06-16 MED ORDER — SIMVASTATIN 40 MG PO TABS
40.0000 mg | ORAL_TABLET | Freq: Every day | ORAL | Status: DC
  Administered 2018-06-16 – 2018-06-18 (×3): 40 mg via ORAL
  Filled 2018-06-16 (×3): qty 1

## 2018-06-16 MED ORDER — PHENOL 1.4 % MT LIQD: 1.0000 | OROMUCOSAL | Status: DC | PRN

## 2018-06-16 MED ORDER — MENTHOL 3 MG MT LOZG: 1.0000 | LOZENGE | OROMUCOSAL | Status: DC | PRN

## 2018-06-16 MED ORDER — MEPERIDINE HCL 50 MG/ML IJ SOLN: 6.2500 mg | INTRAMUSCULAR | Status: DC | PRN

## 2018-06-16 MED ORDER — PHENYLEPHRINE 40 MCG/ML (10ML) SYRINGE FOR IV PUSH (FOR BLOOD PRESSURE SUPPORT)
PREFILLED_SYRINGE | INTRAVENOUS | Status: AC
  Filled 2018-06-16: qty 10

## 2018-06-16 MED ORDER — ESMOLOL HCL 100 MG/10ML IV SOLN
INTRAVENOUS | Status: AC
  Filled 2018-06-16: qty 10

## 2018-06-16 MED ORDER — THROMBIN 20000 UNITS EX SOLR
CUTANEOUS | Status: DC | PRN
  Administered 2018-06-16: 20000 [IU] via TOPICAL

## 2018-06-16 MED ORDER — MORPHINE SULFATE (PF) 2 MG/ML IV SOLN
1.0000 mg | INTRAVENOUS | Status: DC | PRN
  Administered 2018-06-16 – 2018-06-19 (×9): 2 mg via INTRAVENOUS
  Filled 2018-06-16 (×9): qty 1

## 2018-06-16 MED ORDER — LACTATED RINGERS IV SOLN
INTRAVENOUS | Status: DC | PRN
  Administered 2018-06-16 (×2): via INTRAVENOUS

## 2018-06-16 MED ORDER — FLEET ENEMA 7-19 GM/118ML RE ENEM: 1.0000 | ENEMA | Freq: Once | RECTAL | Status: DC | PRN

## 2018-06-16 MED ORDER — SODIUM CHLORIDE 0.9 % IV SOLN: INTRAVENOUS | Status: DC | PRN

## 2018-06-16 MED ORDER — LIDOCAINE 2% (20 MG/ML) 5 ML SYRINGE
INTRAMUSCULAR | Status: AC
  Filled 2018-06-16: qty 5

## 2018-06-16 MED ORDER — LABETALOL HCL 5 MG/ML IV SOLN
INTRAVENOUS | Status: DC | PRN
  Administered 2018-06-16 (×2): 5 mg via INTRAVENOUS

## 2018-06-16 MED ORDER — SENNOSIDES-DOCUSATE SODIUM 8.6-50 MG PO TABS: 1.0000 | ORAL_TABLET | Freq: Every evening | ORAL | Status: DC | PRN

## 2018-06-16 MED ORDER — MIDAZOLAM HCL 2 MG/2ML IJ SOLN
INTRAMUSCULAR | Status: AC
  Filled 2018-06-16: qty 2

## 2018-06-16 MED ORDER — METFORMIN HCL 500 MG PO TABS
500.0000 mg | ORAL_TABLET | Freq: Two times a day (BID) | ORAL | Status: DC
  Administered 2018-06-17 – 2018-06-19 (×4): 500 mg via ORAL
  Filled 2018-06-16 (×4): qty 1

## 2018-06-16 MED ORDER — LABETALOL HCL 200 MG PO TABS
200.0000 mg | ORAL_TABLET | Freq: Two times a day (BID) | ORAL | Status: DC
  Administered 2018-06-16 – 2018-06-19 (×5): 200 mg via ORAL
  Filled 2018-06-16 (×5): qty 1

## 2018-06-16 MED ORDER — OXYCODONE-ACETAMINOPHEN 5-325 MG PO TABS
1.0000 | ORAL_TABLET | ORAL | Status: DC | PRN
  Administered 2018-06-16 (×2): 2 via ORAL
  Administered 2018-06-17: 1 via ORAL
  Administered 2018-06-18 – 2018-06-19 (×5): 2 via ORAL
  Filled 2018-06-16 (×4): qty 2
  Filled 2018-06-16: qty 1
  Filled 2018-06-16 (×3): qty 2

## 2018-06-16 MED ORDER — PROPOFOL 10 MG/ML IV BOLUS
INTRAVENOUS | Status: DC | PRN
  Administered 2018-06-16 (×2): 20 mg via INTRAVENOUS
  Administered 2018-06-16: 50 mg via INTRAVENOUS
  Administered 2018-06-16 (×2): 20 mg via INTRAVENOUS

## 2018-06-16 MED ORDER — SODIUM CHLORIDE 0.9% FLUSH: 3.0000 mL | INTRAVENOUS | Status: DC | PRN

## 2018-06-16 MED ORDER — CEFAZOLIN SODIUM-DEXTROSE 2-4 GM/100ML-% IV SOLN
2.0000 g | Freq: Three times a day (TID) | INTRAVENOUS | Status: AC
  Administered 2018-06-16 – 2018-06-17 (×2): 2 g via INTRAVENOUS
  Filled 2018-06-16 (×2): qty 100

## 2018-06-16 MED ORDER — VITAMIN E 45 MG (100 UNIT) PO CAPS: 200.0000 [IU] | ORAL_CAPSULE | Freq: Every day | ORAL | Status: DC

## 2018-06-16 MED ORDER — BISACODYL 5 MG PO TBEC
5.0000 mg | DELAYED_RELEASE_TABLET | Freq: Every day | ORAL | Status: DC | PRN
  Administered 2018-06-17: 5 mg via ORAL
  Filled 2018-06-16: qty 1

## 2018-06-16 MED ORDER — ONDANSETRON HCL 4 MG/2ML IJ SOLN
4.0000 mg | Freq: Four times a day (QID) | INTRAMUSCULAR | Status: DC | PRN
  Administered 2018-06-16 – 2018-06-19 (×7): 4 mg via INTRAVENOUS
  Filled 2018-06-16 (×6): qty 2

## 2018-06-16 SURGICAL SUPPLY — 99 items
ADH SKN CLS APL DERMABOND .7 (GAUZE/BANDAGES/DRESSINGS) ×2
APL SKNCLS STERI-STRIP NONHPOA (GAUZE/BANDAGES/DRESSINGS) ×2
APPLIER CLIP 11 MED OPEN (CLIP) ×6
APR CLP MED 11 20 MLT OPN (CLIP) ×4
BENZOIN TINCTURE PRP APPL 2/3 (GAUZE/BANDAGES/DRESSINGS) ×1 IMPLANT
BLADE CLIPPER SURG (BLADE) IMPLANT
BLADE SURG 10 STRL SS (BLADE) ×3 IMPLANT
BONE VIVIGEN FORMABLE 10CC (Bone Implant) ×6 IMPLANT
CAGE COUGAR ALIF LRG 10 (Cage) ×1 IMPLANT
CAGE COUGAR ALIF MED 5 12 (Cage) ×1 IMPLANT
CLIP APPLIE 11 MED OPEN (CLIP) ×4 IMPLANT
CLIP LIGATING EXTRA MED SLVR (CLIP) ×3 IMPLANT
CLIP LIGATING EXTRA SM BLUE (MISCELLANEOUS) ×3 IMPLANT
CLSR STERI-STRIP ANTIMIC 1/2X4 (GAUZE/BANDAGES/DRESSINGS) ×1 IMPLANT
CORDS BIPOLAR (ELECTRODE) ×3 IMPLANT
COVER SURGICAL LIGHT HANDLE (MISCELLANEOUS) ×3 IMPLANT
DERMABOND ADVANCED (GAUZE/BANDAGES/DRESSINGS) ×1
DERMABOND ADVANCED .7 DNX12 (GAUZE/BANDAGES/DRESSINGS) ×2 IMPLANT
DRAPE C-ARM 42X72 X-RAY (DRAPES) ×6 IMPLANT
DRAPE POUCH INSTRU U-SHP 10X18 (DRAPES) ×3 IMPLANT
DRAPE SURG 17X23 STRL (DRAPES) ×9 IMPLANT
DRSG MEPILEX BORDER 4X12 (GAUZE/BANDAGES/DRESSINGS) ×3 IMPLANT
DURAPREP 26ML APPLICATOR (WOUND CARE) ×3 IMPLANT
ELECT BLADE 4.0 EZ CLEAN MEGAD (MISCELLANEOUS) ×6
ELECT CAUTERY BLADE 6.4 (BLADE) ×3 IMPLANT
ELECT REM PT RETURN 9FT ADLT (ELECTROSURGICAL) ×3
ELECTRODE BLDE 4.0 EZ CLN MEGD (MISCELLANEOUS) ×4 IMPLANT
ELECTRODE REM PT RTRN 9FT ADLT (ELECTROSURGICAL) ×2 IMPLANT
GAUZE SPONGE 4X4 12PLY STRL LF (GAUZE/BANDAGES/DRESSINGS) ×1 IMPLANT
GAUZE SPONGE 4X4 16PLY XRAY LF (GAUZE/BANDAGES/DRESSINGS) IMPLANT
GLOVE BIO SURGEON STRL SZ7 (GLOVE) ×8 IMPLANT
GLOVE BIO SURGEON STRL SZ8 (GLOVE) ×7 IMPLANT
GLOVE BIOGEL PI IND STRL 7.0 (GLOVE) ×2 IMPLANT
GLOVE BIOGEL PI IND STRL 8 (GLOVE) ×4 IMPLANT
GLOVE BIOGEL PI INDICATOR 7.0 (GLOVE) ×3
GLOVE BIOGEL PI INDICATOR 8 (GLOVE) ×4
GLOVE SS BIOGEL STRL SZ 7.5 (GLOVE) ×2 IMPLANT
GLOVE SUPERSENSE BIOGEL SZ 7.5 (GLOVE) ×1
GOWN STRL REUS W/ TWL LRG LVL3 (GOWN DISPOSABLE) ×6 IMPLANT
GOWN STRL REUS W/ TWL XL LVL3 (GOWN DISPOSABLE) ×2 IMPLANT
GOWN STRL REUS W/TWL LRG LVL3 (GOWN DISPOSABLE) ×21
GOWN STRL REUS W/TWL XL LVL3 (GOWN DISPOSABLE) ×6
GRAFT BNE MATRIX VG FRMBL L 10 (Bone Implant) IMPLANT
HEMOSTAT SURGICEL 2X14 (HEMOSTASIS) IMPLANT
INSERT FOGARTY 61MM (MISCELLANEOUS) IMPLANT
INSERT FOGARTY SM (MISCELLANEOUS) IMPLANT
IV CATH 14GX2 1/4 (CATHETERS) ×1 IMPLANT
KIT BASIN OR (CUSTOM PROCEDURE TRAY) ×3 IMPLANT
KIT TURNOVER KIT B (KITS) ×3 IMPLANT
LOOP VESSEL MAXI BLUE (MISCELLANEOUS) IMPLANT
LOOP VESSEL MINI RED (MISCELLANEOUS) IMPLANT
NDL HYPO 25GX1X1/2 BEV (NEEDLE) ×2 IMPLANT
NDL SPNL 18GX3.5 QUINCKE PK (NEEDLE) ×2 IMPLANT
NEEDLE HYPO 25GX1X1/2 BEV (NEEDLE) ×3 IMPLANT
NEEDLE SPNL 18GX3.5 QUINCKE PK (NEEDLE) ×3 IMPLANT
NS IRRIG 1000ML POUR BTL (IV SOLUTION) ×5 IMPLANT
PACK LAMINECTOMY ORTHO (CUSTOM PROCEDURE TRAY) ×3 IMPLANT
PACK UNIVERSAL I (CUSTOM PROCEDURE TRAY) ×3 IMPLANT
PAD ARMBOARD 7.5X6 YLW CONV (MISCELLANEOUS) ×12 IMPLANT
PATTIES SURGICAL .5 X1 (DISPOSABLE) ×3 IMPLANT
SCREW CANCELLOUS 6.5 32MM (Screw) ×1 IMPLANT
SPONGE INTESTINAL PEANUT (DISPOSABLE) ×9 IMPLANT
SPONGE LAP 18X18 X RAY DECT (DISPOSABLE) IMPLANT
SPONGE LAP 4X18 RFD (DISPOSABLE) IMPLANT
SPONGE SURGIFOAM ABS GEL 100 (HEMOSTASIS) ×6 IMPLANT
STAPLER VISISTAT 35W (STAPLE) IMPLANT
STRIP CLOSURE SKIN 1/2X4 (GAUZE/BANDAGES/DRESSINGS) IMPLANT
SURGIFLO W/THROMBIN 8M KIT (HEMOSTASIS) IMPLANT
SUT MNCRL AB 4-0 PS2 18 (SUTURE) ×3 IMPLANT
SUT PDS AB 1 CTX 36 (SUTURE) ×6 IMPLANT
SUT PROLENE 4 0 RB 1 (SUTURE)
SUT PROLENE 4-0 RB1 .5 CRCL 36 (SUTURE) IMPLANT
SUT PROLENE 5 0 C 1 24 (SUTURE) IMPLANT
SUT PROLENE 5 0 CC1 (SUTURE) IMPLANT
SUT PROLENE 6 0 C 1 30 (SUTURE) ×3 IMPLANT
SUT PROLENE 6 0 CC (SUTURE) IMPLANT
SUT SILK 0 TIES 10X30 (SUTURE) ×3 IMPLANT
SUT SILK 2 0 TIES 10X30 (SUTURE) ×6 IMPLANT
SUT SILK 2 0SH CR/8 30 (SUTURE) IMPLANT
SUT SILK 3 0 TIES 10X30 (SUTURE) ×6 IMPLANT
SUT SILK 3 0SH CR/8 30 (SUTURE) IMPLANT
SUT VIC AB 0 CT1 18XCR BRD 8 (SUTURE) IMPLANT
SUT VIC AB 0 CT1 8-18 (SUTURE) ×6
SUT VIC AB 1 CT1 18XCR BRD 8 (SUTURE) IMPLANT
SUT VIC AB 1 CT1 27 (SUTURE) ×6
SUT VIC AB 1 CT1 27XBRD ANBCTR (SUTURE) ×4 IMPLANT
SUT VIC AB 1 CT1 8-18 (SUTURE) ×6
SUT VIC AB 1 CTX 36 (SUTURE) ×6
SUT VIC AB 1 CTX36XBRD ANBCTR (SUTURE) ×4 IMPLANT
SUT VIC AB 2-0 CT2 18 VCP726D (SUTURE) ×4 IMPLANT
SYR BULB IRRIGATION 50ML (SYRINGE) ×3 IMPLANT
TAPE CLOTH SURG 6X10 WHT LF (GAUZE/BANDAGES/DRESSINGS) ×1 IMPLANT
TOWEL GREEN STERILE (TOWEL DISPOSABLE) ×3 IMPLANT
TOWEL OR 17X24 6PK STRL BLUE (TOWEL DISPOSABLE) ×3 IMPLANT
TOWEL OR 17X26 10 PK STRL BLUE (TOWEL DISPOSABLE) ×3 IMPLANT
TRAY FOLEY CATH SILVER 16FR (SET/KITS/TRAYS/PACK) ×3 IMPLANT
WASHER 13.0MM (Orthopedic Implant) ×1 IMPLANT
WATER STERILE IRR 1000ML POUR (IV SOLUTION) ×3 IMPLANT
YANKAUER SUCT BULB TIP NO VENT (SUCTIONS) ×3 IMPLANT

## 2018-06-16 NOTE — Op Note (Signed)
    OPERATIVE REPORT  DATE OF SURGERY: 06/16/2018  PATIENT: Raymond Barrera, 57 y.o. male MRN: [id number]  DOB: Jun 11, 1961  PRE-OPERATIVE DIAGNOSIS: Degenerative disc disease  POST-OPERATIVE DIAGNOSIS:  Same  PROCEDURE: Anterior exposure for L4-5 and L5-S1 disc fusion  SURGEON:  Curt Jews, M.D.  Co-surgeon for the exposure Dr. Phylliss Bob  ANESTHESIA: General  EBL: 90 ml  Total I/O In: 1950 [I.V.:1700; IV Piggyback:250] Out: 350 [Urine:260; Blood:90]  BLOOD ADMINISTERED: None  DRAINS: None  SPECIMEN: None  COUNTS CORRECT:  YES  PLAN OF CARE: PACU  PATIENT DISPOSITION:  PACU - hemodynamically stable  PROCEDURE DETAILS: The patient was taken to the operating room placed supine position where the area of the abdomen was prepped and draped in usual sterile fashion.  Lateral C arm projection was used to demonstrate the level of the L4-5 and L5-S1 disc.  This area was marked on the surface of the skin.  A left peri-median incision was made and carried down through the subcutaneous fat with electrocautery.  The anterior rectus sheath was opened in line with the skin incision.  The rectus muscle was mobilized circumferentially.  The retroperitoneal space was entered bluntly in the left lower quadrant and the intraperitoneal contents were mobilized to the right.  The posterior rectus sheath was opened laterally and mobilization of intraperitoneal contents to the right was continued.  The left ureter was identified and preserved and mobilized to the right.  The patient had transitional anatomy.  The lower level of repair was at the level of the bifurcation.  The left iliac vein and artery were mobilized to the right.  The iliolumbar vein was ligated with 2-0 silk ties and divided.  The patient had 2 lumbar arterial vessels at the same level and these were clipped and divided.  Blunt dissection was used to continue to mobilize the arterial venous structures to the right.  Kitner  dissector was used to mobilize the tissue above the 2 disc levels to be treated.  The Thompson retractor was brought onto the field and the reverse lip blades were positioned to the right and left of the L5-S1 disc and malleable retractors were used for superior and inferior exposure.  A needle was placed in the L5-S1 disc and C-arm was brought back onto the films to confirm that this was the appropriate level.  The fusion was then accomplished by Dr. Lynann Bologna.  After fusion at this level I re-scrubbed and reposition the [facility] retractor to the L4-5 level  The remainder of the procedure will be dictated as a separate note by Dr. Levora Dredge, M.D., [facility] 06/16/2018 2:26 PM

## 2018-06-16 NOTE — H&P (Signed)
PREOPERATIVE H&P  Chief Complaint: Bilateral leg pain  HPI: Raymond Barrera is a 57 y.o. male who presents with ongoing pain in the bilateral legs x many years. Pain continues to be severe. Patient is s/p a decompression at L4/5 and L5/S1.  MRI reveals moderate recurrent stenosis at L4/5 and L5/S1.  Of note, I did discuss patient's situation with Dr. Sallyanne Kuster (cardiology). Patient does have severe LV dysfunction. He did feel patient was at acceptable risk for surgery. Patient does understand that he is at an increased risk of medical complications, and he does wish to proceed.   Patient has failed multiple forms of conservative care and continues to have pain (see office notes for additional details regarding the patient's full course of treatment)  Past Medical History:  Diagnosis Date  . Anxiety    due to the stroke  . Arthritis   . Back pain    hx of buldging disc  . Diabetes mellitus without complication ([place])    takes Metformin daily  . High cholesterol    takes Zocor daily  . Hypertension    takes Benazepril and HCTZ  daily  . Joint pain   . Joint swelling   . Memory impairment    occassional - from stroke  . Myocardial infarction ([place]) 1987  . Pneumonia    hx of-80's  . PONV (postoperative nausea and vomiting)    took a while for him to wake up after previous anesthesia  . Shortness of breath dyspnea    do to pain  . Sleep apnea    never had a sleep study,but states Dr. Cindie Laroche says he has it  . Slurred speech   . Stroke ([place]) 08/2013   7 mini-strokes, last stroke 2017  . TIA (transient ischemic attack) 2014   x 7   . Urinary frequency    Past Surgical History:  Procedure Laterality Date  . ANKLE SURGERY  2008   left ankle-otif-[facility]  . BACK SURGERY    . JOINT REPLACEMENT     both hips replaced   . LUMBAR LAMINECTOMY/DECOMPRESSION MICRODISCECTOMY Right 10/17/2014   Procedure: LUMBAR LAMINECTOMY/DECOMPRESSION MICRODISCECTOMY 2 LEVELS;  Surgeon:  [name], MD;  Location: [place] [facility];  Service: Neurosurgery;  Laterality: Right;  Right L45 L5S1 laminectomy and foraminotomy  . MASS EXCISION  09/13/2012   Procedure: EXCISION MASS;  Surgeon: Jamesetta So, MD;  Location: [facility];  Service: General;  Laterality: N/A;  . TONSILLECTOMY    . TOTAL HIP ARTHROPLASTY Right 03/20/2015  . TOTAL HIP ARTHROPLASTY Right 03/20/2015   Procedure: TOTAL HIP ARTHROPLASTY ANTERIOR APPROACH;  Surgeon: Renette Butters, MD;  Location: [place];  Service: Orthopedics;  Laterality: Right;  . TOTAL HIP ARTHROPLASTY Left 01/08/2016   Procedure: TOTAL HIP ARTHROPLASTY ANTERIOR APPROACH;  Surgeon: Renette Butters, MD;  Location: [place];  Service: Orthopedics;  Laterality: Left;   Social History   Socioeconomic History  . Marital status: Married    Spouse name: Not on file  . Number of children: Not on file  . Years of education: Not on file  . Highest education level: Not on file  Occupational History  . Not on file  Social Needs  . Financial resource strain: Not on file  . Food insecurity:    Worry: Not on file    Inability: Not on file  . Transportation needs:    Medical: Not on file    Non-medical: Not on file  Tobacco Use  .  Smoking status: Current Every Day Smoker    Packs/day: 0.25    Years: 47.00    Pack years: 11.75    Types: Cigarettes  . Smokeless tobacco: Never Used  Substance and Sexual Activity  . Alcohol use: No    Comment: quit 2012  . Drug use: Not Currently    Types: Cocaine    Comment: many yrs ago., last time- late 2016  . Sexual activity: Never  Lifestyle  . Physical activity:    Days per week: Not on file    Minutes per session: Not on file  . Stress: Not on file  Relationships  . Social connections:    Talks on phone: Not on file    Gets together: Not on file    Attends religious service: Not on file    Active member of club or organization: Not on file    Attends meetings of clubs or organizations: Not on file     Relationship status: Not on file  Other Topics Concern  . Not on file  Social History Narrative  . Not on file   Family History  Problem Relation Age of Onset  . Heart disease Father    Allergies  Allergen Reactions  . Poison Ivy Extract [Poison Ivy Extract] Itching and Rash   Prior to Admission medications   Medication Sig Start Date End Date Taking? Authorizing Provider  amLODipine (NORVASC) 10 MG tablet Take 10 mg by mouth daily.   Yes [provider]  aspirin 81 MG tablet Take 1 tablet (81 mg total) by mouth daily. 06/14/18  Yes Croitoru, Mihai, MD  benazepril (LOTENSIN) 20 MG tablet Take 20 mg by mouth daily.   Yes [provider]  ferrous gluconate (IRON 27) 240 (27 FE) MG tablet Take 240 mg by mouth daily.   Yes [provider]  folic acid (FOLVITE) 094 MCG tablet Take 400 mcg by mouth daily.   Yes [provider]  gabapentin (NEURONTIN) 300 MG capsule Take 300 mg by mouth 3 (three) times daily.   Yes [provider]  glucose 4 GM chewable tablet Chew 1 tablet by mouth as needed for low blood sugar.   Yes [provider]  labetalol (NORMODYNE) 200 MG tablet Take 200 mg by mouth 2 (two) times daily.   Yes [provider]  metFORMIN (GLUCOPHAGE) 500 MG tablet Take 500 mg by mouth 2 (two) times daily with a meal.   Yes [provider]  Multiple Vitamin (MULTIVITAMIN WITH MINERALS) TABS tablet Take 1 tablet by mouth daily.   Yes [provider]  naproxen (NAPROSYN) 500 MG tablet Take 500 mg by mouth 2 (two) times daily.   Yes [provider]  Omega-3 Fatty Acids (FISH OIL) 1000 MG CAPS Take 1,000 mg by mouth daily.   Yes [provider]  oxyCODONE-acetaminophen (PERCOCET/ROXICET) 5-325 MG tablet Take 2 tablets by mouth every 4 (four) hours as needed for pain. 05/26/18  Yes [provider]  simvastatin (ZOCOR) 40 MG tablet Take 40 mg by mouth daily.   Yes [provider]  vitamin E 200 UNIT capsule Take 200 Units by mouth daily.   Yes [provider]     All other systems have been reviewed and were otherwise negative with the exception of those mentioned in the HPI and as above.  Physical Exam: Vitals:   06/16/18 0706  BP: (!) 155/105  Pulse: 69  Resp: 20  Temp: 98 F (36.7 C)  SpO2: 100%    There is no height or weight on file to calculate BMI.  General: Alert, no acute distress Cardiovascular: No pedal edema Respiratory: No cyanosis, no use of accessory musculature Skin: No lesions in the area of chief complaint Neurologic: Sensation intact distally Psychiatric: Patient is competent for consent with normal mood and affect Lymphatic: No axillary or cervical lymphadenopathy   Assessment/Plan: BILATERAL LEG PAIN Plan for Procedure(s): LUMBAR 4-5 LUMBAR 5-SACRUM 1 ANTERIOR LUMBAR INTERBODY FUSION WITH INSTRUMENTATION AND ALLOGRAFT   Sinclair Ship, MD 06/16/2018 8:01 AM

## 2018-06-16 NOTE — Anesthesia Procedure Notes (Signed)
Arterial Line Insertion Start/End7/17/2019 8:00 AM, 06/16/2018 8:02 AM Performed by: Montez Hageman, MD, Raenette Rover, CRNA, CRNA  Patient location: Pre-op. Preanesthetic checklist: patient identified, IV checked, site marked, risks and benefits discussed, surgical consent, monitors and equipment checked, pre-op evaluation, timeout performed and anesthesia consent Lidocaine 1% used for infiltration Left, radial was placed Catheter size: 20 G Hand hygiene performed  and maximum sterile barriers used   Attempts: 1 Procedure performed without using ultrasound guided technique. Following insertion, dressing applied and Biopatch. Post procedure assessment: normal and unchanged  Patient tolerated the procedure well with no immediate complications.

## 2018-06-16 NOTE — Anesthesia Procedure Notes (Addendum)
Procedure Name: Intubation Date/Time: 06/16/2018 8:53 AM Performed by: Raenette Rover, CRNA Pre-anesthesia Checklist: Patient identified, Emergency Drugs available, Suction available and Patient being monitored Patient Re-evaluated:Patient Re-evaluated prior to induction Oxygen Delivery Method: Circle system utilized Preoxygenation: Pre-oxygenation with 100% oxygen Induction Type: IV induction Ventilation: Oral airway inserted - appropriate to patient size and Two handed mask ventilation required Laryngoscope Size: Miller and 3 Grade View: Grade I Tube type: Oral Tube size: 8.0 mm Number of attempts: 2 Airway Equipment and Method: Stylet and Oral airway Placement Confirmation: ETT inserted through vocal cords under direct vision,  positive ETCO2,  CO2 detector and breath sounds checked- equal and bilateral Secured at: 22 cm Tube secured with: Tape Dental Injury: Teeth and Oropharynx as per pre-operative assessment

## 2018-06-16 NOTE — Op Note (Signed)
DATE OF PROCEDURE: Raymond Barrera  DOB: Dec 06, 1960  MR#: [id number]  Date of Surgery: 06/16/2018   OPERATIVE REPORT   PREOPERATIVE DIAGNOSES: 1. Axial low back pain. 2. Bilateral lumbar radiculopathy. 3. L4-5, L5-S1 degenerative disk disease. 4. Status post previous L4-5 and L5-S1 decompression, with subsequent advancement of degenerative disc disease and neuroforaminal stenosis involving L4-5 and L5-S1  POSTOPERATIVE DIAGNOSES: 1. Axial low back pain. 2. Bilateral lumbar radiculopathy. 3. L4-5, L5-S1 degenerative disk disease. 4. Status post previous L4-5 and L5-S1 decompression, with subsequent advancement of degenerative disc disease and neuroforaminal stenosis involving L4-5 and L5-S1  PROCEDURE: 1. Anterior lumbar interbody fusion, L4-5, L5-S1 (expsure peformed by Dr. Sherren Mocha Early) 2. Insertion of interbody device x2 (Cougar intervertebral spacers). 3. Placement of anterior instrumentation, L4-5, L5-S1. 4. Intraoperative use of fluoroscopy..  5. Use of morselized allograft-Vivigen  SURGEON: Phylliss Bob, MD  ASSISTANT: Pricilla Holm, PA-C  ANESTHESIA: General endotracheal anesthesia.  COMPLICATIONS: None.  DISPOSITION: Stable.  ESTIMATED BLOOD LOSS: Minimal  INDICATIONS FOR SURGERY: Briefly, Raymond Barrera is a very pleasant 57 year old male, who did present to me with severe and debilitating pain in both his back and bilateral legs. The patient's MRI was notable for degenerative disk disease at L4-5 and L5-S1 and L4-5 and L5-S1 neuroforaminal stenosis, correlating to his pain. The patient has been having ongoing and debilitating pain in his back and bilateral legs. I did feel that his degenerated disks the stenosis noted above were likely resulting in his ongoing pain and dysfunction.Given the patient's ongoing pain and dysfunction, we did discuss proceeding with the procedure noted above. The patient was fully aware that surgery  may or may not alleviate his ongoing symptoms, particularly his low back pain. The patient did elect to proceed.  Of note, the patient was evaluated by cardiology prior to his surgery.  It was discussed with the patient and his wife that he is at an increased risk of complications, given the fact that his ejection fraction was low.  His cardiologist however did feel that he was optimized for surgery.  The patient did understand that he was at an increased risk, and he did wish to proceed with surgery.  OPERATIVE DETAILS: On 06/16/2018, the patient was brought to surgery and general endotracheal anesthesia was administered. The patient was placed supine on the hospital bed. The patient's abdomen was prepped and draped in the usual sterile fashion. An anterior retroperitoneal approach was then performed by Dr. Curt Jews. Once the anterior lumbar spine was noted, we did focus our attention on the L5-S1 intervertebral space. I then performed a thorough and complete L5-S1 intervertebral diskectomy to the level of the posterior longitudinal ligament. I was very pleased with the diskectomy that I was able to accomplish. The endplates were then appropriately prepared and the appropriate sized anterior intervertebral spacer (17mm) was packed with Vivigen and tamped into position. I was very pleased with the press-fit of the implant. I then proceeded with placement of anterior instrumentation at the L5-S1 intervertebral space. To accomplish this, I did use an awl to prepare the trajectory of anterior vertebral body screw. A buttress device was attached to the back end of the screw and did help provide anterior fixation across the L5-S1 intervertebral space. I was very pleased with the press-fit of the anterior hardware. I then turned the exposure more superiorly to the L4-5 intervertebral space with the assistance of Dr. Donnetta Hutching. Once again, a thorough and complete L4-5  intervertebral diskectomy was performed. The endplates were appropriately  prepared and once again, the appropriate sized intervertebral spacer (37mm) was packed with Vivigen and tamped into position in the usual fashion. As previously described, anterior fixation was placed with a screw and anterior buttress device into the L5 vertebral body securing the intervertebral device across the L4-5 intervertebral space. I was very pleased with the final AP and lateral fluoroscopic images. The wound was copiously irrigated. The fascia was closed using #1 PDS. The subcutaneous layer was closed using 0 Vicryl followed by 2-0 Vicryl, and the skin was closed using 4-0 Monocryl. Benzoin and Steri-Strips were applied followed by sterile dressing. The patient was then awoken from general endotracheal anesthesia and transferred to recovery in stable condition.  Of note, Pricilla Holm was my assistant throughout surgery, and did aid in retraction, suctioning, and closure for both the anterior and posterior portions of the procedure.     Phylliss Bob, MD

## 2018-06-16 NOTE — Transfer of Care (Signed)
Immediate Anesthesia Transfer of Care Note  Patient: Raymond Barrera  Procedure(s) Performed: LUMBAR 4-5 LUMBAR 5-SACRUM 1 ANTERIOR LUMBAR INTERBODY FUSION WITH INSTRUMENTATION AND ALLOGRAFT (Bilateral ) ABDOMINAL EXPOSURE (N/A )  Patient Location: PACU  Anesthesia Type:General  Level of Consciousness: awake, alert , oriented, drowsy and patient cooperative  Airway & Oxygen Therapy: Patient Spontanous Breathing and Patient connected to nasal cannula oxygen  Post-op Assessment: Report given to RN and Post -op Vital signs reviewed and stable  Post vital signs: Reviewed and stable  Last Vitals:  Vitals Value Taken Time  BP 157/104 06/16/2018  1:52 PM  Temp    Pulse 81 06/16/2018  1:54 PM  Resp 21 06/16/2018  1:54 PM  SpO2 98 % 06/16/2018  1:54 PM  Vitals shown include unvalidated device data.  Last Pain:  Vitals:   06/16/18 0706  TempSrc: Oral  PainSc:       Patients Stated Pain Goal: 5 (10/02/10 1735)  Complications: No apparent anesthesia complications

## 2018-06-16 NOTE — Progress Notes (Signed)
Patient c/o chest tightness rated 2 out of 10.while on the phone with [place] PA. She ordered trop stat. After 2 min patient says CP is better rated 1 oiut of 10. He now thinks his CP related to gas and food eaten. Will continue to monitor.

## 2018-06-16 NOTE — Progress Notes (Addendum)
RN notified from [place]. RN informed patient is in bigenimy w/HR 100. Writer and on coming RN both contacted covering provider for Dr. Phylliss Bob [phone]. Patient remains in bigeminy. Return call  from [place], [place], on cal for Ortho. Per this call, plan is to order a stat 12 lead EKG and troponin. Writer completed 12 lead EKG, night RN drawing troponin.

## 2018-06-16 NOTE — Progress Notes (Signed)
Comfortable in PACU.  Abdomen soft. 2+ posterior tibial pulses bilaterally. Hemodynamically stable.

## 2018-06-16 NOTE — Plan of Care (Signed)
  Problem: Safety: Goal: Ability to remain free from injury will improve Outcome: Progressing   Problem: Education: Goal: Ability to verbalize activity precautions or restrictions will improve Outcome: Progressing Goal: Knowledge of the prescribed therapeutic regimen will improve Outcome: Progressing   Problem: Activity: Goal: Ability to avoid complications of mobility impairment will improve Outcome: Progressing Goal: Ability to tolerate increased activity will improve Outcome: Not Progressing Goal: Will remain free from falls Outcome: Progressing   Problem: Bowel/Gastric: Goal: Gastrointestinal status for postoperative course will improve Outcome: Progressing   Problem: Clinical Measurements: Goal: Ability to maintain clinical measurements within normal limits will improve Outcome: Progressing   Problem: Pain Management: Goal: Pain level will decrease Outcome: Progressing   Problem: Skin Integrity: Goal: Will show signs of wound healing Outcome: Progressing

## 2018-06-16 NOTE — Anesthesia Preprocedure Evaluation (Addendum)
Anesthesia Evaluation  Patient identified by MRN, date of birth, ID band Patient awake    Reviewed: Allergy & Precautions, NPO status , Patient's Chart, lab work & pertinent test results  History of Anesthesia Complications (+) PONV  Airway Mallampati: II  TM Distance: >3 FB Neck ROM: Full    Dental no notable dental hx. (+) Partial Upper   Pulmonary Current Smoker,    Pulmonary exam normal breath sounds clear to auscultation       Cardiovascular hypertension, + Past MI (4098)  Normal cardiovascular exam Rhythm:Regular Rate:Normal  06/15/18 Lexiscan results:  Nuclear stress EF: 24%.  There was no ST segment deviation noted during stress.  No T wave inversion was noted during stress.  This is a high risk study.  The left ventricular ejection fraction is severely decreased (<30%).  High risk nuclear study due to severe dilated cardiomyopathy, LVEF<30%. There is no reversible ischemia.     Neuro/Psych TIACVA (2014) negative neurological ROS  negative psych ROS   GI/Hepatic negative GI ROS, Neg liver ROS,   Endo/Other  diabetes, Type 2, Oral Hypoglycemic Agents  Renal/GU negative Renal ROS  negative genitourinary   Musculoskeletal negative musculoskeletal ROS (+)   Abdominal   Peds negative pediatric ROS (+)  Hematology negative hematology ROS (+)   Anesthesia Other Findings   Reproductive/Obstetrics negative OB ROS                            Anesthesia Physical Anesthesia Plan  ASA: IV  Anesthesia Plan: General   Post-op Pain Management:    Induction: Intravenous  PONV Risk Score and Plan: 2 and Ondansetron and Treatment may vary due to age or medical condition  Airway Management Planned: Oral ETT  Additional Equipment: Arterial line  Intra-op Plan:   Post-operative Plan: Extubation in OR  Informed Consent: I have reviewed the patients History and Physical,  chart, labs and discussed the procedure including the risks, benefits and alternatives for the proposed anesthesia with the patient or authorized representative who has indicated his/her understanding and acceptance.   Dental advisory given  Plan Discussed with: CRNA  Anesthesia Plan Comments:         Anesthesia Quick Evaluation

## 2018-06-17 ENCOUNTER — Other Ambulatory Visit: Payer: Self-pay

## 2018-06-17 ENCOUNTER — Inpatient Hospital Stay (HOSPITAL_COMMUNITY): Admission: RE | Admit: 2018-06-17 | Payer: Medicaid Other | Source: Ambulatory Visit | Admitting: Orthopedic Surgery

## 2018-06-17 ENCOUNTER — Inpatient Hospital Stay (HOSPITAL_COMMUNITY): Payer: Medicaid Other

## 2018-06-17 ENCOUNTER — Inpatient Hospital Stay (HOSPITAL_COMMUNITY)
Admission: RE | Disposition: A | Discharge status: Home Health | Payer: Self-pay | Source: Home / Self Care | Attending: Orthopedic Surgery

## 2018-06-17 ENCOUNTER — Inpatient Hospital Stay (HOSPITAL_COMMUNITY): Payer: Medicaid Other | Admitting: Certified Registered"

## 2018-06-17 LAB — RAPID URINE DRUG SCREEN, HOSP PERFORMED
AMPHETAMINES: NOT DETECTED
Benzodiazepines: POSITIVE — AB
Cocaine: NOT DETECTED
OPIATES: POSITIVE — AB
TETRAHYDROCANNABINOL: NOT DETECTED

## 2018-06-17 LAB — TYPE AND SCREEN
ABO/RH(D): O POS
ABO/RH(D): O POS
ANTIBODY SCREEN: NEGATIVE
Antibody Screen: NEGATIVE

## 2018-06-17 LAB — GLUCOSE, CAPILLARY
GLUCOSE-CAPILLARY: 185 mg/dL — AB (ref 70–99)
GLUCOSE-CAPILLARY: 145 mg/dL — AB (ref 70–99)
Glucose-Capillary: 161 mg/dL — ABNORMAL HIGH (ref 70–99)

## 2018-06-17 SURGERY — POSTERIOR LUMBAR FUSION 2 LEVEL
Anesthesia: General | Site: Spine Lumbar

## 2018-06-17 MED ORDER — MIDAZOLAM HCL 5 MG/5ML IJ SOLN
INTRAMUSCULAR | Status: DC | PRN
  Administered 2018-06-17: 2 mg via INTRAVENOUS

## 2018-06-17 MED ORDER — BUPIVACAINE-EPINEPHRINE (PF) 0.25% -1:200000 IJ SOLN
INTRAMUSCULAR | Status: AC
  Filled 2018-06-17: qty 30

## 2018-06-17 MED ORDER — BUPIVACAINE LIPOSOME 1.3 % IJ SUSP
20.0000 mL | INTRAMUSCULAR | Status: DC
  Filled 2018-06-17: qty 20

## 2018-06-17 MED ORDER — SODIUM CHLORIDE 0.9 % IV SOLN
INTRAVENOUS | Status: DC | PRN
  Administered 2018-06-17: 5 ug/kg/min via INTRAVENOUS

## 2018-06-17 MED ORDER — 0.9 % SODIUM CHLORIDE (POUR BTL) OPTIME
TOPICAL | Status: DC | PRN
  Administered 2018-06-17: 3000 mL

## 2018-06-17 MED ORDER — LACTATED RINGERS IV SOLN
INTRAVENOUS | Status: DC | PRN
  Administered 2018-06-17: 07:00:00 via INTRAVENOUS

## 2018-06-17 MED ORDER — PROPOFOL 10 MG/ML IV BOLUS
INTRAVENOUS | Status: AC
  Filled 2018-06-17: qty 20

## 2018-06-17 MED ORDER — LIDOCAINE HCL (CARDIAC) PF 100 MG/5ML IV SOSY
PREFILLED_SYRINGE | INTRAVENOUS | Status: DC | PRN
  Administered 2018-06-17: 100 mg via INTRAVENOUS

## 2018-06-17 MED ORDER — DEXMEDETOMIDINE HCL IN NACL 200 MCG/50ML IV SOLN
INTRAVENOUS | Status: DC | PRN
  Administered 2018-06-17: .4 ug/kg/h via INTRAVENOUS

## 2018-06-17 MED ORDER — SODIUM CHLORIDE 0.9 % IV SOLN
0.1000 mg/kg/h | INTRAVENOUS | Status: DC
  Filled 2018-06-17: qty 2

## 2018-06-17 MED ORDER — MIDAZOLAM HCL 2 MG/2ML IJ SOLN
INTRAMUSCULAR | Status: AC
  Filled 2018-06-17: qty 2

## 2018-06-17 MED ORDER — PNEUMOCOCCAL VAC POLYVALENT 25 MCG/0.5ML IJ INJ
0.5000 mL | INJECTION | INTRAMUSCULAR | Status: AC
  Administered 2018-06-18: 0.5 mL via INTRAMUSCULAR
  Filled 2018-06-17: qty 0.5

## 2018-06-17 MED ORDER — HYDROMORPHONE HCL 1 MG/ML IJ SOLN
INTRAMUSCULAR | Status: AC
  Filled 2018-06-17: qty 1

## 2018-06-17 MED ORDER — FENTANYL CITRATE (PF) 250 MCG/5ML IJ SOLN
INTRAMUSCULAR | Status: AC
  Filled 2018-06-17: qty 5

## 2018-06-17 MED ORDER — PHENYLEPHRINE HCL 10 MG/ML IJ SOLN
INTRAMUSCULAR | Status: DC | PRN
  Administered 2018-06-17 (×2): 80 ug via INTRAVENOUS
  Administered 2018-06-17: 120 ug via INTRAVENOUS
  Administered 2018-06-17 (×2): 80 ug via INTRAVENOUS

## 2018-06-17 MED ORDER — LACTATED RINGERS IV SOLN
INTRAVENOUS | Status: DC | PRN
  Administered 2018-06-17: 08:00:00 via INTRAVENOUS

## 2018-06-17 MED ORDER — EPHEDRINE SULFATE 50 MG/ML IJ SOLN
INTRAMUSCULAR | Status: DC | PRN
  Administered 2018-06-17: 10 mg via INTRAVENOUS

## 2018-06-17 MED ORDER — PROPOFOL 10 MG/ML IV BOLUS
INTRAVENOUS | Status: DC | PRN
  Administered 2018-06-17: 40 mg via INTRAVENOUS
  Administered 2018-06-17: 30 mg via INTRAVENOUS
  Administered 2018-06-17: 40 mg via INTRAVENOUS
  Administered 2018-06-17: 50 mg via INTRAVENOUS
  Administered 2018-06-17: 40 mg via INTRAVENOUS

## 2018-06-17 MED ORDER — HYDROMORPHONE HCL 1 MG/ML IJ SOLN
0.2500 mg | INTRAMUSCULAR | Status: DC | PRN
  Administered 2018-06-17: 0.25 mg via INTRAVENOUS
  Administered 2018-06-17: 0.5 mg via INTRAVENOUS

## 2018-06-17 MED ORDER — SODIUM CHLORIDE 0.9 % IV SOLN
INTRAVENOUS | Status: DC | PRN
  Administered 2018-06-17: 30 ug/min via INTRAVENOUS

## 2018-06-17 MED ORDER — CEFAZOLIN SODIUM-DEXTROSE 2-3 GM-%(50ML) IV SOLR
INTRAVENOUS | Status: DC | PRN
  Administered 2018-06-17: 2 g via INTRAVENOUS

## 2018-06-17 MED ORDER — DEXAMETHASONE SODIUM PHOSPHATE 10 MG/ML IJ SOLN
INTRAMUSCULAR | Status: DC | PRN
  Administered 2018-06-17: 10 mg via INTRAVENOUS

## 2018-06-17 MED ORDER — LIDOCAINE 2% (20 MG/ML) 5 ML SYRINGE
INTRAMUSCULAR | Status: AC
  Filled 2018-06-17: qty 5

## 2018-06-17 MED ORDER — FENTANYL CITRATE (PF) 100 MCG/2ML IJ SOLN
INTRAMUSCULAR | Status: DC | PRN
  Administered 2018-06-17: 50 ug via INTRAVENOUS
  Administered 2018-06-17: 100 ug via INTRAVENOUS
  Administered 2018-06-17: 50 ug via INTRAVENOUS

## 2018-06-17 MED ORDER — ROCURONIUM BROMIDE 10 MG/ML (PF) SYRINGE
PREFILLED_SYRINGE | INTRAVENOUS | Status: AC
  Filled 2018-06-17: qty 10

## 2018-06-17 MED ORDER — METHYLENE BLUE 0.5 % INJ SOLN
INTRAVENOUS | Status: AC
  Filled 2018-06-17: qty 10

## 2018-06-17 MED ORDER — BUPIVACAINE-EPINEPHRINE 0.25% -1:200000 IJ SOLN
INTRAMUSCULAR | Status: DC | PRN
  Administered 2018-06-17: 30 mL

## 2018-06-17 MED ORDER — ALBUMIN HUMAN 5 % IV SOLN
INTRAVENOUS | Status: DC | PRN
  Administered 2018-06-17: 09:00:00 via INTRAVENOUS

## 2018-06-17 MED ORDER — BUPIVACAINE LIPOSOME 1.3 % IJ SUSP
INTRAMUSCULAR | Status: DC | PRN
  Administered 2018-06-17: 20 mL

## 2018-06-17 MED ORDER — THROMBIN (RECOMBINANT) 20000 UNITS EX SOLR
CUTANEOUS | Status: AC
  Filled 2018-06-17: qty 20000

## 2018-06-17 MED ORDER — SUCCINYLCHOLINE CHLORIDE 20 MG/ML IJ SOLN
INTRAMUSCULAR | Status: DC | PRN
  Administered 2018-06-17: 120 mg via INTRAVENOUS

## 2018-06-17 MED ORDER — PROMETHAZINE HCL 25 MG/ML IJ SOLN: 6.2500 mg | INTRAMUSCULAR | Status: DC | PRN

## 2018-06-17 MED ORDER — ETOMIDATE 2 MG/ML IV SOLN
INTRAVENOUS | Status: DC | PRN
  Administered 2018-06-17: 14 mg via INTRAVENOUS

## 2018-06-17 MED FILL — Sodium Chloride IV Soln 0.9%: INTRAVENOUS | Qty: 1000 | Status: AC

## 2018-06-17 MED FILL — Heparin Sodium (Porcine) Inj 1000 Unit/ML: INTRAMUSCULAR | Qty: 30 | Status: AC

## 2018-06-17 MED FILL — Thrombin (Recombinant) For Soln 20000 Unit: CUTANEOUS | Qty: 1 | Status: AC

## 2018-06-17 SURGICAL SUPPLY — 91 items
APL SKNCLS STERI-STRIP NONHPOA (GAUZE/BANDAGES/DRESSINGS) ×1
BENZOIN TINCTURE PRP APPL 2/3 (GAUZE/BANDAGES/DRESSINGS) ×1 IMPLANT
BLADE CLIPPER SURG (BLADE) IMPLANT
BONE VIVIGEN FORMABLE 5.4CC (Bone Implant) ×2 IMPLANT
BUR PRESCISION 1.7 ELITE (BURR) ×1 IMPLANT
BUR ROUND FLUTED 5 RND (BURR) ×1 IMPLANT
BUR ROUND PRECISION 4.0 (BURR) IMPLANT
BUR SABER RD CUTTING 3.0 (BURR) IMPLANT
CARTRIDGE OIL MAESTRO DRILL (MISCELLANEOUS) ×2 IMPLANT
CLSR STERI-STRIP ANTIMIC 1/2X4 (GAUZE/BANDAGES/DRESSINGS) ×1 IMPLANT
CONT SPEC 4OZ CLIKSEAL STRL BL (MISCELLANEOUS) ×2 IMPLANT
COVER SURGICAL LIGHT HANDLE (MISCELLANEOUS) ×2 IMPLANT
DIFFUSER DRILL AIR PNEUMATIC (MISCELLANEOUS) ×4 IMPLANT
DRAIN CHANNEL 15F RND FF W/TCR (WOUND CARE) ×2 IMPLANT
DRAPE C-ARM 42X72 X-RAY (DRAPES) ×2 IMPLANT
DRAPE C-ARMOR (DRAPES) ×1 IMPLANT
DRAPE POUCH INSTRU U-SHP 10X18 (DRAPES) ×2 IMPLANT
DRAPE SURG 17X23 STRL (DRAPES) ×8 IMPLANT
DRSG MEPILEX BORDER 4X12 (GAUZE/BANDAGES/DRESSINGS) IMPLANT
DRSG MEPILEX BORDER 4X8 (GAUZE/BANDAGES/DRESSINGS) IMPLANT
DURAPREP 26ML APPLICATOR (WOUND CARE) ×2 IMPLANT
ELECT BLADE 4.0 EZ CLEAN MEGAD (MISCELLANEOUS) ×2
ELECT CAUTERY BLADE 6.4 (BLADE) ×3 IMPLANT
ELECT REM PT RETURN 9FT ADLT (ELECTROSURGICAL) ×2
ELECTRODE BLDE 4.0 EZ CLN MEGD (MISCELLANEOUS) ×1 IMPLANT
ELECTRODE REM PT RTRN 9FT ADLT (ELECTROSURGICAL) ×1 IMPLANT
EVACUATOR SILICONE 100CC (DRAIN) ×2 IMPLANT
FEE INTRAOP MONITOR IMPULS NCS (MISCELLANEOUS) IMPLANT
GAUZE SPONGE 4X4 12PLY STRL (GAUZE/BANDAGES/DRESSINGS) ×2 IMPLANT
GAUZE SPONGE 4X4 16PLY XRAY LF (GAUZE/BANDAGES/DRESSINGS) ×5 IMPLANT
GLOVE BIO SURGEON STRL SZ7 (GLOVE) ×4 IMPLANT
GLOVE BIO SURGEON STRL SZ8 (GLOVE) ×3 IMPLANT
GLOVE BIOGEL PI IND STRL 7.0 (GLOVE) ×1 IMPLANT
GLOVE BIOGEL PI IND STRL 8 (GLOVE) ×1 IMPLANT
GLOVE BIOGEL PI INDICATOR 7.0 (GLOVE) ×1
GLOVE BIOGEL PI INDICATOR 8 (GLOVE) ×1
GOWN STRL REUS W/ TWL LRG LVL3 (GOWN DISPOSABLE) ×2 IMPLANT
GOWN STRL REUS W/ TWL XL LVL3 (GOWN DISPOSABLE) ×1 IMPLANT
GOWN STRL REUS W/TWL LRG LVL3 (GOWN DISPOSABLE) ×4
GOWN STRL REUS W/TWL XL LVL3 (GOWN DISPOSABLE) ×2
GRAFT BNE MATRIX VG FRMBL MD 5 (Bone Implant) IMPLANT
INTRAOP MONITOR FEE IMPULS NCS (MISCELLANEOUS) ×1
INTRAOP MONITOR FEE IMPULSE (MISCELLANEOUS) ×1
IV CATH 14GX2 1/4 (CATHETERS) ×2 IMPLANT
K-WIRE VIPER SHARP 1.45 (WIRE) ×12
KIT ACCESS 10G DIAMOND TIP (NEEDLE) ×2 IMPLANT
KIT BASIN OR (CUSTOM PROCEDURE TRAY) ×2 IMPLANT
KIT POSITION SURG JACKSON T1 (MISCELLANEOUS) ×2 IMPLANT
KIT TURNOVER KIT B (KITS) ×2 IMPLANT
KWIRE VIPER SHARP 1.45 (WIRE) IMPLANT
MARKER SKIN DUAL TIP RULER LAB (MISCELLANEOUS) ×3 IMPLANT
NDL HYPO 25GX1X1/2 BEV (NEEDLE) ×1 IMPLANT
NDL SPNL 18GX3.5 QUINCKE PK (NEEDLE) ×2 IMPLANT
NEEDLE 22X1 1/2 (OR ONLY) (NEEDLE) ×2 IMPLANT
NEEDLE HYPO 25GX1X1/2 BEV (NEEDLE) ×2 IMPLANT
NEEDLE SPNL 18GX3.5 QUINCKE PK (NEEDLE) ×4 IMPLANT
NS IRRIG 1000ML POUR BTL (IV SOLUTION) ×2 IMPLANT
OIL CARTRIDGE MAESTRO DRILL (MISCELLANEOUS) ×4
PACK LAMINECTOMY ORTHO (CUSTOM PROCEDURE TRAY) ×2 IMPLANT
PACK UNIVERSAL I (CUSTOM PROCEDURE TRAY) ×2 IMPLANT
PAD ARMBOARD 7.5X6 YLW CONV (MISCELLANEOUS) ×4 IMPLANT
PATTIES SURGICAL .5 X1 (DISPOSABLE) ×3 IMPLANT
PATTIES SURGICAL .5 X3 (DISPOSABLE) IMPLANT
PATTIES SURGICAL .5X1.5 (GAUZE/BANDAGES/DRESSINGS) ×2 IMPLANT
PATTIES SURGICAL .75X.75 (GAUZE/BANDAGES/DRESSINGS) ×2 IMPLANT
PROBE PEDCLE PROBE MAGSTM DISP (MISCELLANEOUS) ×1 IMPLANT
ROD VIPOR2 70MM PRE LARDOSED (Rod) ×1 IMPLANT
SCREW POLY VIPER MIS 7X45MM (Screw) ×6 IMPLANT
SCREW SET SINGLE INNER MIS (Screw) ×6 IMPLANT
SPONGE INTESTINAL PEANUT (DISPOSABLE) IMPLANT
SPONGE SURGIFOAM ABS GEL 100 (HEMOSTASIS) ×1 IMPLANT
STRIP CLOSURE SKIN 1/2X4 (GAUZE/BANDAGES/DRESSINGS) IMPLANT
SURGIFLO W/THROMBIN 8M KIT (HEMOSTASIS) IMPLANT
SUT MNCRL AB 4-0 PS2 18 (SUTURE) ×3 IMPLANT
SUT VIC AB 0 CT1 18XCR BRD 8 (SUTURE) ×2 IMPLANT
SUT VIC AB 0 CT1 8-18 (SUTURE) ×4
SUT VIC AB 1 CT1 18XCR BRD 8 (SUTURE) ×2 IMPLANT
SUT VIC AB 1 CT1 8-18 (SUTURE) ×4
SUT VIC AB 2-0 CT2 18 VCP726D (SUTURE) ×4 IMPLANT
SYR 20CC LL (SYRINGE) ×2 IMPLANT
SYR BULB IRRIGATION 50ML (SYRINGE) ×3 IMPLANT
SYR CONTROL 10ML LL (SYRINGE) ×4 IMPLANT
SYR TB 1ML LUER SLIP (SYRINGE) ×2 IMPLANT
TAP CANN VIPER2 DL 5.0 (TAP) ×1 IMPLANT
TAP CANN VIPER2 DL 6.0 (TAP) ×2 IMPLANT
TAPE CLOTH SURG 6X10 WHT LF (GAUZE/BANDAGES/DRESSINGS) ×1 IMPLANT
TOWEL OR 17X24 6PK STRL BLUE (TOWEL DISPOSABLE) ×2 IMPLANT
TOWEL OR 17X26 10 PK STRL BLUE (TOWEL DISPOSABLE) ×2 IMPLANT
TRAY FOLEY MTR SLVR 16FR STAT (SET/KITS/TRAYS/PACK) ×2 IMPLANT
WATER STERILE IRR 1000ML POUR (IV SOLUTION) ×2 IMPLANT
YANKAUER SUCT BULB TIP NO VENT (SUCTIONS) ×2 IMPLANT

## 2018-06-17 NOTE — Transfer of Care (Signed)
Immediate Anesthesia Transfer of Care Note  Patient: Raymond Barrera  Procedure(s) Performed: LUMBAR 4-5 LUMBAR 5 - SACRUM 1 POSTERIOR SPINAL FUSION WITH INSTRUMENTATION AND ALLOGRAFT (N/A Spine Lumbar)  Patient Location: PACU  Anesthesia Type:General  Level of Consciousness: awake and patient cooperative  Airway & Oxygen Therapy: Patient Spontanous Breathing and Patient connected to nasal cannula oxygen  Post-op Assessment: Report given to RN and Post -op Vital signs reviewed and stable  Post vital signs: Reviewed and stable  Last Vitals:  Vitals Value Taken Time  BP 141/97 06/17/2018 10:56 AM  Temp    Pulse 81 06/17/2018 10:59 AM  Resp 14 06/17/2018 11:02 AM  SpO2 98 % 06/17/2018 10:59 AM  Vitals shown include unvalidated device data.  Last Pain:  Vitals:   06/17/18 0509  TempSrc:   PainSc: 6       Patients Stated Pain Goal: 5 (92/44/62 8638)  Complications: No apparent anesthesia complications

## 2018-06-17 NOTE — Anesthesia Preprocedure Evaluation (Signed)
Anesthesia Evaluation  Patient identified by MRN, date of birth, ID band Patient awake    Reviewed: Allergy & Precautions, NPO status , Patient's Chart, lab work & pertinent test results  History of Anesthesia Complications (+) PONV  Airway Mallampati: II  TM Distance: >3 FB Neck ROM: Full    Dental no notable dental hx.    Pulmonary sleep apnea , Current Smoker,    Pulmonary exam normal breath sounds clear to auscultation       Cardiovascular hypertension, + Past MI and +CHF  Normal cardiovascular exam Rhythm:Regular Rate:Normal  Left ventricle: The cavity size was mildly dilated. There was   moderate concentric hypertrophy. Systolic function was severely   reduced. The estimated ejection fraction was in the range of 20%   to 25%. Severe diffuse hypokinesis with no identifiable regional   variations. Doppler parameters are consistent with abnormal left   ventricular relaxation (grade 1 diastolic dysfunction). Acoustic   contrast opacification revealed no evidence ofthrombus. - Left atrium: The atrium was severely dilated. - Direct comparison with images from March 2018 shows little change   in left ventricular function.   Neuro/Psych TIAnegative psych ROS   GI/Hepatic negative GI ROS, (+)     substance abuse  cocaine use,   Endo/Other  diabetes  Renal/GU negative Renal ROS  negative genitourinary   Musculoskeletal negative musculoskeletal ROS (+)   Abdominal   Peds negative pediatric ROS (+)  Hematology negative hematology ROS (+)   Anesthesia Other Findings   Reproductive/Obstetrics negative OB ROS                             Anesthesia Physical Anesthesia Plan  ASA: IV  Anesthesia Plan: General   Post-op Pain Management:    Induction: Intravenous  PONV Risk Score and Plan: 2 and Ondansetron, Dexamethasone and Treatment may vary due to age or medical condition  Airway  Management Planned: Oral ETT  Additional Equipment: Arterial line  Intra-op Plan:   Post-operative Plan:   Informed Consent: I have reviewed the patients History and Physical, chart, labs and discussed the procedure including the risks, benefits and alternatives for the proposed anesthesia with the patient or authorized representative who has indicated his/her understanding and acceptance.   Dental advisory given  Plan Discussed with: CRNA and Surgeon  Anesthesia Plan Comments:        Anesthesia Quick Evaluation

## 2018-06-17 NOTE — Anesthesia Postprocedure Evaluation (Signed)
Anesthesia Post Note  Patient: Raymond Barrera  Procedure(s) Performed: LUMBAR 4-5 LUMBAR 5-SACRUM 1 ANTERIOR LUMBAR INTERBODY FUSION WITH INSTRUMENTATION AND ALLOGRAFT (Bilateral ) ABDOMINAL EXPOSURE (N/A )     Patient location during evaluation: PACU Anesthesia Type: General Level of consciousness: awake and alert Pain management: pain level controlled Vital Signs Assessment: post-procedure vital signs reviewed and stable Respiratory status: spontaneous breathing, nonlabored ventilation, respiratory function stable and patient connected to nasal cannula oxygen Cardiovascular status: blood pressure returned to baseline and stable Postop Assessment: no apparent nausea or vomiting Anesthetic complications: no    Last Vitals:  Vitals:   06/17/18 0430 06/17/18 0445  BP: (!) 141/100 (!) 163/114  Pulse: 88 (!) 47  Resp: 19 (!) 22  Temp:    SpO2: 99% 100%    Last Pain:  Vitals:   06/17/18 0509  TempSrc:   PainSc: 6                  Montez Hageman

## 2018-06-17 NOTE — Op Note (Signed)
DATE OF PROCEDURE: Valgene Deloatch  DOB: 23-Apr-1961  MR#: [id number]  Date of Surgery: 06/17/2018    OPERATIVE REPORT   PREOPERATIVE DIAGNOSES: 1. Axial low back pain. 2. Bilateral lumbar radiculopathy. 3. L4-5, L5-S1 degenerative disk disease. 4. Status post L4-5 and L5-S1 anterior lumbar interbody fusion, requiring posterior fusion with instrumentation  POSTOPERATIVE DIAGNOSES: 1. Axial low back pain. 2. Bilateral lumbar radiculopathy. 3. L4-5, L5-S1 degenerative disk disease. 4. Status post L4-5 and L5-S1 anterior lumbar interbody fusion, requiring posterior fusion with instrumentation  PROCEDURE (stage 2 of 2):  1. Posterior spinal fusion, L4-5, L5-S1. 2. Placement of posterior segmental instrumentation, L4, L5, S1 bilaterally. 3. Use of morselized allograft Vivigen. 4. Intraoperative use of fluoroscopy   SURGEON: Phylliss Bob, MD  ASSISTANT: Pricilla Holm, PA-C  ANESTHESIA: General endotracheal anesthesia.  COMPLICATIONS: None.  DISPOSITION: Stable.  ESTIMATED BLOOD LOSS:Minimal.  INDICATIONS FOR SURGERY: Briefly, Mr. Husby is status post an anterior lumbar interbody fusion involving L4-5 and L5-S1.  Refer to operative report dated 06/16/2018 for a detailed account of the indications for surgery and the details of that specific surgery.  The patient presents today for stage II of what was to be a two-stage procedure.  OPERATIVE DETAILS: On 06/17/2018, the patient was brought to surgery and general endotracheal anesthesia was administered. The patient was placed prone on a Jackson spinal table.The back was then prepped and draped in the usual sterile fashion. I then made paramedian incisions on the right and left sides, just lateral to the lateral borders of the pedicles from L4-S1. On the left side, the posterolateral gutter and posterior elements were identified and exposed and decorticated. The Vivigen was packed into  the posterolateral gutter on the left side to aid in the success of the posterior fusion. I then tapped the L4, L5, and S1 pedicles bilaterally using a 6 mm tap. Of note, I did use neurologic monitoring and I did test each of the taps using triggered EMG. There was no tap that tested below 20 milliamps. I then placed 7 x 45 mm screws bilaterally at S1 and 7 x 45 mm screws bilaterally at L4 and at L5. 70 mm rods were then secured into the tulip heads of the screws bilaterally. Caps were then placed and a final locking procedure was performed. I was very pleased with the final AP and lateral fluoroscopic images. The wound was then copiously irrigated. On the right and left sides, the fascia was closed using #1 Vicryl. The subcutaneous layer was closed using 0 Vicryl followed by 2-0 Vicryl, and the skin was then closed using 4-0 Monocryl. Benzoin and Steri-Strips were applied followed by sterile dressing. All instrument counts were correct at the termination of the procedure. Again, I did use neurologic monitoring throughout the surgery, and there was no abnormal EMG activity noted throughout the surgery.  Of note, Pricilla Holm was my assistant throughout surgery, and did aid in retraction, suctioning, and closure for both the anterior and posterior portions of the procedure.     Phylliss Bob, MD

## 2018-06-17 NOTE — Progress Notes (Signed)
Pt came to PACU with tape to inner bilateral feet near ankle with mild bleeding

## 2018-06-17 NOTE — H&P (Signed)
Patient tolerated day 1 of his two-stage procedure well yesterday.  He presents today for stage II, specifically, a posterior spinal fusion with instrumentation spanning L4-S1.  We will proceed as planned.

## 2018-06-17 NOTE — Anesthesia Postprocedure Evaluation (Signed)
Anesthesia Post Note  Patient: Raymond Barrera  Procedure(s) Performed: LUMBAR 4-5 LUMBAR 5 - SACRUM 1 POSTERIOR SPINAL FUSION WITH INSTRUMENTATION AND ALLOGRAFT (N/A Spine Lumbar)     Patient location during evaluation: PACU Anesthesia Type: General Level of consciousness: awake and alert Pain management: pain level controlled Vital Signs Assessment: post-procedure vital signs reviewed and stable Respiratory status: spontaneous breathing, nonlabored ventilation, respiratory function stable and patient connected to nasal cannula oxygen Cardiovascular status: blood pressure returned to baseline and stable Postop Assessment: no apparent nausea or vomiting Anesthetic complications: no    Last Vitals:  Vitals:   06/17/18 1142 06/17/18 1156  BP: 129/86 127/89  Pulse: 82 83  Resp: 13 14  Temp:    SpO2: 93% 95%    Last Pain:  Vitals:   06/17/18 1115  TempSrc:   PainSc: 10-Worst pain ever    LLE Motor Response: Purposeful movement;Responds to commands (06/17/18 1156) LLE Sensation: Full sensation (06/17/18 1156) RLE Motor Response: Responds to commands;Purposeful movement (06/17/18 1156) RLE Sensation: Full sensation (06/17/18 1156)      Arran Fessel S

## 2018-06-17 NOTE — Anesthesia Procedure Notes (Addendum)
Procedure Name: Intubation Date/Time: 06/17/2018 7:48 AM Performed by: Lance Coon, CRNA Pre-anesthesia Checklist: Patient identified, Emergency Drugs available, Suction available and Patient being monitored Patient Re-evaluated:Patient Re-evaluated prior to induction Oxygen Delivery Method: Circle System Utilized Preoxygenation: Pre-oxygenation with 100% oxygen Induction Type: IV induction Ventilation: Mask ventilation without difficulty and Oral airway inserted - appropriate to patient size Laryngoscope Size: Mac and 4 Grade View: Grade II Tube type: Oral Tube size: 7.5 mm Number of attempts: 2 Airway Equipment and Method: Stylet and Oral airway Placement Confirmation: ETT inserted through vocal cords under direct vision,  positive ETCO2 and breath sounds checked- equal and bilateral Secured at: 22 cm Tube secured with: Tape Dental Injury: Teeth and Oropharynx as per pre-operative assessment  Comments: Unable to visualize glottic opening with Miller 3. Mac 4 successful. Intubation by NP SRNA

## 2018-06-18 ENCOUNTER — Encounter (HOSPITAL_COMMUNITY): Payer: Self-pay | Admitting: Orthopedic Surgery

## 2018-06-18 LAB — GLUCOSE, CAPILLARY
Glucose-Capillary: 184 mg/dL — ABNORMAL HIGH (ref 70–99)
Glucose-Capillary: 172 mg/dL — ABNORMAL HIGH (ref 70–99)

## 2018-06-18 MED FILL — Sodium Chloride IV Soln 0.9%: INTRAVENOUS | Qty: 1000 | Status: AC

## 2018-06-18 MED FILL — Heparin Sodium (Porcine) Inj 1000 Unit/ML: INTRAMUSCULAR | Qty: 30 | Status: AC

## 2018-06-18 NOTE — Evaluation (Signed)
Physical Therapy Evaluation Patient Details Name: Raymond Barrera MRN: [id number] DOB: 1961/05/02 Today's Date: 06/18/2018   History of Present Illness  57 yo admitted for 2 stage ALIF/PLIF L4-S1. PMhx: anxiety, back pain, DM, HTN, CVA  Clinical Impression  Pt pleasant and very diaphoretic in bed with need for partial bath and linen change prior to mobility. Pt and spouse educated for all back precautions with handout provided. Wife with back surgery a year ago and reports understanding but needs education for positioning with pt and assist. Pt educated for brace wear, transfers, gait and functional mobility. Pt with decreased strength, transfers, gait and balance who will benefit from acute therapy to maximize mobility, function and safety to decrease burden of care.      Follow Up Recommendations Home health PT;Supervision for mobility/OOB    Equipment Recommendations  Rolling walker with 5" wheels    Recommendations for Other Services       Precautions / Restrictions Precautions Precautions: Back;Fall Precaution Booklet Issued: Yes (comment) Precaution Comments: handout provided with all aspects of education completed Required Braces or Orthoses: Spinal Brace Spinal Brace: Lumbar corset;Applied in sitting position      Mobility  Bed Mobility Overal bed mobility: Needs Assistance Bed Mobility: Rolling;Sidelying to Sit Rolling: Min assist Sidelying to sit: Mod assist       General bed mobility comments: cues for sequence and precautions with assist to elevate trunk and tactile cues and assist to bring legs off of bed  Transfers Overall transfer level: Needs assistance   Transfers: Sit to/from Stand Sit to Stand: Min assist         General transfer comment: min assist to rise from bed, mod assist to control descent to chair with cues for hand placement and safety  Ambulation/Gait Ambulation/Gait assistance: Min assist Gait Distance (Feet): 170 Feet Assistive  device: Rolling walker (2 wheeled) Gait Pattern/deviations: Step-through pattern;Decreased stride length   Gait velocity interpretation: >2.62 ft/sec, indicative of community ambulatory General Gait Details: cues for posture, position in RW and looking up  Stairs            Wheelchair Mobility    Modified Rankin (Stroke Patients Only)       Balance Overall balance assessment: Needs assistance   Sitting balance-Leahy Scale: Fair       Standing balance-Leahy Scale: Poor                               Pertinent Vitals/Pain Pain Assessment: No/denies pain    Home Living Family/patient expects to be discharged to:: Private residence Living Arrangements: Spouse/significant other Available Help at Discharge: Family;Available 24 hours/day Type of Home: House Home Access: Ramped entrance     Home Layout: One level Home Equipment: Walker - 2 wheels;Cane - single point      Prior Function Level of Independence: Independent         Comments: wife reports she was assisting with car transfers     Hand Dominance        Extremity/Trunk Assessment   Upper Extremity Assessment Upper Extremity Assessment: Defer to OT evaluation    Lower Extremity Assessment Lower Extremity Assessment: Generalized weakness;LLE deficits/detail LLE Deficits / Details: pt with grossly 2+/5 strength with inability to complete full ROM marching or LAQ and pt reports baseline after CVA    Cervical / Trunk Assessment Cervical / Trunk Assessment: Other exceptions Cervical / Trunk Exceptions: post surgical guarding  Communication  Communication: No difficulties  Cognition Arousal/Alertness: Awake/alert Behavior During Therapy: WFL for tasks assessed/performed Overall Cognitive Status: Within Functional Limits for tasks assessed                                        General Comments      Exercises     Assessment/Plan    PT Assessment Patient  needs continued PT services  PT Problem List Decreased strength;Decreased mobility;Decreased safety awareness;Decreased activity tolerance;Decreased knowledge of use of DME       PT Treatment Interventions Gait training;Therapeutic exercise;Patient/family education;Functional mobility training;DME instruction;Therapeutic activities    PT Goals (Current goals can be found in the Care Plan section)  Acute Rehab PT Goals Patient Stated Goal: return home, paint, watch football PT Goal Formulation: With patient/family Time For Goal Achievement: 06/25/18 Potential to Achieve Goals: Good    Frequency Min 5X/week   Barriers to discharge        Co-evaluation PT/OT/SLP Co-Evaluation/Treatment: Yes Reason for Co-Treatment: Complexity of the patient's impairments (multi-system involvement);For patient/therapist safety PT goals addressed during session: Mobility/safety with mobility;Proper use of DME         AM-PAC PT "6 Clicks" Daily Activity  Outcome Measure Difficulty turning over in bed (including adjusting bedclothes, sheets and blankets)?: Unable Difficulty moving from lying on back to sitting on the side of the bed? : Unable Difficulty sitting down on and standing up from a chair with arms (e.g., wheelchair, bedside commode, etc,.)?: Unable Help needed moving to and from a bed to chair (including a wheelchair)?: A Little Help needed walking in hospital room?: A Little Help needed climbing 3-5 steps with a railing? : A Lot 6 Click Score: 11    End of Session Equipment Utilized During Treatment: Back brace;Gait belt Activity Tolerance: Patient tolerated treatment well Patient left: in chair;with call bell/phone within reach;with family/visitor present Nurse Communication: Mobility status;Precautions PT Visit Diagnosis: Other abnormalities of gait and mobility (R26.89);Muscle weakness (generalized) (M62.81)    Time: 3419-6222 PT Time Calculation (min) (ACUTE ONLY): 28  min   Charges:   PT Evaluation $PT Eval Moderate Complexity: 1 Mod     PT G Codes:        Elwyn Reach, PT [phone]   [place] B Kaleeya Hancock 06/18/2018, 12:03 PM

## 2018-06-18 NOTE — Progress Notes (Signed)
    Patient doing well  Denies leg pain Moderate LBP and abdominal discomfort   Physical Exam: Vitals:   06/18/18 0000 06/18/18 0400  BP: 118/82 132/82  Pulse: (!) 101   Resp: (!) 30   Temp: 99 F (37.2 C) 98.5 F (36.9 C)  SpO2: 94%     Dressing in place NVI  POD s/p A/P fusion, doing well  - up with PT/OT, encourage ambulation - Percocet for pain, Valium for muscle spasms - d/c home Saturday vs Sunday, with f/u in 2 weeks, depending on progress

## 2018-06-18 NOTE — Evaluation (Signed)
Occupational Therapy Evaluation Patient Details Name: Raymond Barrera MRN: [id number] DOB: 06-09-1961 Today's Date: 06/18/2018    History of Present Illness 57 yo admitted for 2 stage ALIF/PLIF L4-S1. PMhx: anxiety, back pain, DM, HTN, CVA   Clinical Impression   Patient is s/p ALIF / PLIF L4-s1 surgery resulting in functional limitations due to the deficits listed below (see OT problem list). Pt currently requires max (A) to don brace and will need further education on LB adls. Pt could benefit from AE and wife with previous back surg x1 year ago with limited ability to assist due to risk to herself. Wife reports "I have been doing it but I know I shouldn't. " Patient will benefit from skilled OT acutely to increase independence and safety with ADLS to allow discharge hhot and RW (6'6" tall).     Follow Up Recommendations  Home health OT    Equipment Recommendations  3 in 1 bedside commode;Other (comment)(RW)    Recommendations for Other Services       Precautions / Restrictions Precautions Precautions: Back;Fall Precaution Booklet Issued: Yes (comment) Precaution Comments: handout provided adn reviewed in detail for adls with back precautions Required Braces or Orthoses: Spinal Brace Spinal Brace: Lumbar corset;Applied in sitting position Restrictions Weight Bearing Restrictions: No      Mobility Bed Mobility Overal bed mobility: Needs Assistance Bed Mobility: Rolling;Sidelying to Sit Rolling: Min assist Sidelying to sit: Mod assist       General bed mobility comments: cues for sequence and precautions with assist to elevate trunk and tactile cues and assist to bring legs off of bed  Transfers Overall transfer level: Needs assistance   Transfers: Sit to/from Stand Sit to Stand: Min assist         General transfer comment: min assist to rise from bed, mod assist to control descent to chair with cues for hand placement and safety    Balance Overall balance  assessment: Needs assistance   Sitting balance-Leahy Scale: Fair       Standing balance-Leahy Scale: Poor Standing balance comment: relies on bil UE                           ADL either performed or assessed with clinical judgement   ADL Overall ADL's : Needs assistance/impaired Eating/Feeding: Set up;Sitting   Grooming: Set up;Sitting   Upper Body Bathing: Set up;Sitting   Lower Body Bathing: Maximal assistance       Lower Body Dressing: Maximal assistance Lower Body Dressing Details (indicate cue type and reason): pt is unable to corss at this time. pt will need education on AE for LB. Pt iwth medicaid so can issue AE during education Toilet Transfer: Minimal assistance     Toileting - Clothing Manipulation Details (indicate cue type and reason): educated on inabilityt o pend for peri hygiene     Functional mobility during ADLs: Minimal assistance;Rolling walker General ADL Comments: pt smiling during session and states "yall made my day" pt motivated by listening to "earth wind and fire"    Educated patient and wife on don doff TLSO . Encouraged and educated to only unhook one side of the brace     Vision Baseline Vision/History: No visual deficits       Perception     Praxis      Pertinent Vitals/Pain Pain Assessment: No/denies pain     Hand Dominance Right   Extremity/Trunk Assessment Upper Extremity Assessment Upper Extremity Assessment: Overall  WFL for tasks assessed   Lower Extremity Assessment Lower Extremity Assessment: Defer to PT evaluation LLE Deficits / Details: pt with grossly 2+/5 strength with inability to complete full ROM marching or LAQ and pt reports baseline after CVA   Cervical / Trunk Assessment Cervical / Trunk Assessment: Other exceptions Cervical / Trunk Exceptions: post surgical guarding   Communication Communication Communication: No difficulties   Cognition Arousal/Alertness: Awake/alert Behavior During  Therapy: WFL for tasks assessed/performed Overall Cognitive Status: Within Functional Limits for tasks assessed                                     General Comments       Exercises     Shoulder Instructions      Home Living Family/patient expects to be discharged to:: Private residence Living Arrangements: Spouse/significant other Available Help at Discharge: Family;Available 24 hours/day Type of Home: House Home Access: Ramped entrance     Home Layout: One level     Bathroom Shower/Tub: Occupational psychologist: Handicapped height     Home Equipment: Environmental consultant - 2 wheels;Cane - single point          Prior Functioning/Environment Level of Independence: Independent        Comments: wife reports she was assisting with car transfers        OT Problem List: Decreased strength;Impaired balance (sitting and/or standing);Decreased activity tolerance;Decreased safety awareness;Decreased knowledge of use of DME or AE;Decreased knowledge of precautions      OT Treatment/Interventions: Self-care/ADL training;Therapeutic exercise;DME and/or AE instruction;Therapeutic activities;Patient/family education;Balance training    OT Goals(Current goals can be found in the care plan section) Acute Rehab OT Goals Patient Stated Goal: watch football OT Goal Formulation: With patient Time For Goal Achievement: 07/02/18 Potential to Achieve Goals: Good  OT Frequency: Min 3X/week   Barriers to D/C:            Co-evaluation PT/OT/SLP Co-Evaluation/Treatment: Yes Reason for Co-Treatment: Complexity of the patient's impairments (multi-system involvement);Necessary to address cognition/behavior during functional activity;For patient/therapist safety;To address functional/ADL transfers PT goals addressed during session: Mobility/safety with mobility;Proper use of DME OT goals addressed during session: ADL's and self-care;Proper use of Adaptive equipment and  DME;Strengthening/ROM      AM-PAC PT "6 Clicks" Daily Activity     Outcome Measure Help from another person eating meals?: A Little Help from another person taking care of personal grooming?: A Little Help from another person toileting, which includes using toliet, bedpan, or urinal?: A Lot Help from another person bathing (including washing, rinsing, drying)?: A Lot Help from another person to put on and taking off regular upper body clothing?: A Little Help from another person to put on and taking off regular lower body clothing?: A Lot 6 Click Score: 15   End of Session Equipment Utilized During Treatment: Gait belt;Rolling walker;Back brace Nurse Communication: Mobility status;Precautions  Activity Tolerance: Patient tolerated treatment well Patient left: in chair;with call bell/phone within reach;with chair alarm set;with family/visitor present  OT Visit Diagnosis: Unsteadiness on feet (R26.81);Muscle weakness (generalized) (M62.81)                Time: 3536-1443 OT Time Calculation (min): 31 min Charges:  OT General Charges $OT Visit: 1 Visit OT Evaluation $OT Eval Moderate Complexity: 1 Mod G-Codes:      Jeri Modena   OTR/L Pager: [phone] Office: [phone] .   Parke Poisson  B 06/18/2018, 1:29 PM

## 2018-06-19 MED ORDER — OXYCODONE-ACETAMINOPHEN 5-325 MG PO TABS: 1.0000 | ORAL_TABLET | ORAL | 0 refills | Status: DC | PRN

## 2018-06-19 MED ORDER — DIAZEPAM 10 MG PO TABS: 10.0000 mg | ORAL_TABLET | Freq: Three times a day (TID) | ORAL | 0 refills | Status: DC | PRN

## 2018-06-19 MED ORDER — DIAZEPAM 5 MG PO TABS: 5.0000 mg | ORAL_TABLET | Freq: Four times a day (QID) | ORAL | 0 refills | Status: DC | PRN

## 2018-06-19 NOTE — Care Management Note (Signed)
Case Management Note  Patient Details  Name: Raymond Barrera MRN: [id number] Date of Birth: [birth year]  Subjective/Objective:             Spoke to patient's wife. They would like to use [facility] for [facility]. Referral placed to [facility], clinical liaison. Also placed order for RW to be delivered to room prior to DC.        Action/Plan:   Expected Discharge Date:  06/19/18               Expected Discharge Plan:  [place]  In-House Referral:     Discharge planning Services  CM Consult  Post Acute Care Choice:  Durable Medical Equipment, Home Health Choice offered to:  Patient  DME Arranged:  Walker rolling DME Agency:  [place] Arranged:  PT, OT, Nurse's Aide, RN, Social Work [employer] Agency:  [place]  Status of Service:  Completed, signed off  If discussed at [employer], dates discussed:    Additional Comments:  Carles Collet, RN 06/19/2018, 12:14 PM

## 2018-06-19 NOTE — Progress Notes (Signed)
Physical Therapy Treatment Patient Details Name: Raymond Barrera MRN: [id number] DOB: 11-28-1961 Today's Date: 06/19/2018    History of Present Illness 57 yo admitted for 2 stage ALIF/PLIF L4-S1. PMhx: anxiety, back pain, DM, HTN, CVA    PT Comments    Pt ambulated 200' with RW and min-guard A, needed 2 standing rest breaks due to pain 8/10. He reports that he is feeling slightly better today than yesterday. VSS throughout. Discussed activity level at home with pt and wife. PT will continue to follow.    Follow Up Recommendations  Home health PT;Supervision for mobility/OOB     Equipment Recommendations  Rolling walker with 5" wheels    Recommendations for Other Services       Precautions / Restrictions Precautions Precautions: Back;Fall Precaution Booklet Issued: No Precaution Comments: reviewed back precautions, pt could recall only bending Required Braces or Orthoses: Spinal Brace Spinal Brace: Lumbar corset;Applied in sitting position Restrictions Weight Bearing Restrictions: No    Mobility  Bed Mobility Overal bed mobility: Needs Assistance Bed Mobility: Rolling;Sidelying to Sit Rolling: Min assist Sidelying to sit: Min assist       General bed mobility comments: cues for sequencing  Transfers Overall transfer level: Needs assistance Equipment used: Rolling walker (2 wheeled) Transfers: Sit to/from Stand Sit to Stand: Min guard         General transfer comment: pt able to put hands on knees and rise from elevated bed with min-guard A  Ambulation/Gait Ambulation/Gait assistance: Min guard Gait Distance (Feet): 200 Feet Assistive device: Rolling walker (2 wheeled) Gait Pattern/deviations: Step-through pattern;Decreased stride length Gait velocity: decreased Gait velocity interpretation: <1.31 ft/sec, indicative of household ambulator General Gait Details: vc's for erect posture. Pt needed 2 standing rest breaks due to pain   Stairs              Wheelchair Mobility    Modified Rankin (Stroke Patients Only)       Balance Overall balance assessment: Needs assistance Sitting-balance support: Single extremity supported Sitting balance-Leahy Scale: Fair     Standing balance support: Bilateral upper extremity supported Standing balance-Leahy Scale: Poor Standing balance comment: relies on bil UE                            Cognition Arousal/Alertness: Lethargic;Suspect due to medications Behavior During Therapy: WFL for tasks assessed/performed Overall Cognitive Status: Within Functional Limits for tasks assessed                                        Exercises      General Comments General comments (skin integrity, edema, etc.): discussed activity level at home with pt and wife      Pertinent Vitals/Pain Pain Assessment: 0-10 Pain Score: 8  Pain Location: back Pain Descriptors / Indicators: Aching;Operative site guarding;Sore Pain Intervention(s): Monitored during session;Limited activity within patient's tolerance;Premedicated before session    Home Living                      Prior Function            PT Goals (current goals can now be found in the care plan section) Acute Rehab PT Goals Patient Stated Goal: watch football PT Goal Formulation: With patient/family Time For Goal Achievement: 06/25/18 Potential to Achieve Goals: Good Progress towards PT goals: Progressing toward goals  Frequency    Min 5X/week      PT Plan Current plan remains appropriate    Co-evaluation              AM-PAC PT "6 Clicks" Daily Activity  Outcome Measure  Difficulty turning over in bed (including adjusting bedclothes, sheets and blankets)?: A Little Difficulty moving from lying on back to sitting on the side of the bed? : Unable Difficulty sitting down on and standing up from a chair with arms (e.g., wheelchair, bedside commode, etc,.)?: A Little Help needed  moving to and from a bed to chair (including a wheelchair)?: A Little Help needed walking in hospital room?: A Little Help needed climbing 3-5 steps with a railing? : A Lot 6 Click Score: 15    End of Session Equipment Utilized During Treatment: Back brace;Gait belt Activity Tolerance: Patient tolerated treatment well Patient left: in chair;with call bell/phone within reach;with family/visitor present Nurse Communication: Mobility status;Precautions PT Visit Diagnosis: Other abnormalities of gait and mobility (R26.89);Muscle weakness (generalized) (M62.81)     Time: 0761-5183 PT Time Calculation (min) (ACUTE ONLY): 29 min  Charges:  $Gait Training: 8-22 mins $Therapeutic Activity: 8-22 mins                    G Codes:       Leighton Roach, PT  Acute Rehab Services  [place] 06/19/2018, 11:24 AM

## 2018-06-19 NOTE — Progress Notes (Addendum)
Pt and family now ready to D/C due to [name] recommendation to family pt can safely leave, per provider's D/C instructions.  Provider is aware and will place CM consult with an order for face-to-face and orders for RN/Aide/PT/OT and Social Work.  Pt has Medicaid and is unsure of Medicaid barriers but has left VM for weekend RN CM requesting assistance.  Provider's phone is: [phone].  11:49 AM RN CM will connect with [place] services for PT/OT and [facility] will contact most likely on Monday.  Per RN CM, there is no financial assistance with Medicaid and per provider narcotic meds cannot be sent directly to [facility] and narcotics must be picked up directly by pt.  Family and pt updated and is agreeable to D/C.Marland Kitchen  CSW will continue to follow for D/C needs.  Alphonse Guild. Madisyn Mawhinney, LCSW, LCAS, CSI Clinical Social Worker Ph: [phone]

## 2018-06-19 NOTE — Progress Notes (Addendum)
Subjective: 2 Days Post-Op Procedure(s) (LRB): LUMBAR 4-5 LUMBAR 5 - SACRUM 1 POSTERIOR SPINAL FUSION WITH INSTRUMENTATION AND ALLOGRAFT (N/A) Patient reports pain as moderate.  Denies leg pain.  Passing gas.  Taking by mouth and voiding okay.  Slowly progressing with physical therapy.The patient ambulated 200 feet with physical therapy this morning.  Objective: Vital signs in last 24 hours: Temp:  [97.6 F (36.4 C)-98.3 F (36.8 C)] 98.1 F (36.7 C) (07/20 0756) Pulse Rate:  [82-95] 89 (07/20 0756) Resp:  [14-23] 18 (07/20 0756) BP: (92-129)/(74-97) 129/77 (07/20 0756) SpO2:  [94 %-97 %] 97 % (07/20 0756)  Intake/Output from previous day: 07/19 0701 - 07/20 0700 In: 3 [I.V.:3] Out: 675 [Urine:675] Intake/Output this shift: Total I/O In: -  Out: 225 [Urine:225]  No results for input(s): HGB in the last 72 hours. No results for input(s): WBC, RBC, HCT, PLT in the last 72 hours. No results for input(s): NA, K, CL, CO2, BUN, CREATININE, GLUCOSE, CALCIUM in the last 72 hours. No results for input(s): LABPT, INR in the last 72 hours. Lumbar exam: Dressings clean and dry.  Neurovascular status intact to both lower extremities.  Bilateral calves are soft.  No motor or sensory deficits.   Assessment/Plan: 2 Days Post-Op Procedure(s) (LRB): LUMBAR 4-5 LUMBAR 5 - SACRUM 1 POSTERIOR SPINAL FUSION WITH INSTRUMENTATION AND ALLOGRAFT (N/A) Plan: Up with physical therapy. Encourage ambulation. Patient and his wife states that they have no transportation to get home.  She reports that they have arranged transportation for Monday and are asking to stay in the hospital until then.  I did tell them that [facility] Is available as a possibility for discharge, but she states that they are only comfortable with the arrangements they have already made. After physical therapy evaluation the patient and his wife felt that he was ready to go home.  We will get a case manager consult as well asOrders for  home health services for PT/OT RN/aid/social work.  The patient will be discharged home.  They will follow up with Dr. Lynann Bologna in 10-14 days.    Erlene Senters 06/19/2018, 9:02 AM

## 2018-06-19 NOTE — Progress Notes (Signed)
RN rushed to do discharge by pt's spouse. Spouse demanding discharge paperwork immediately after telling RN someone is coming to pick them up. RN went over discharge packet with pt and family.

## 2018-06-19 NOTE — Progress Notes (Signed)
CSW received a call from pt's RN stating pt is ready for D/C and that family is stating they have reservation a about pt D/C'ing today due to their not being able to arrange transportation ahead.  Per RN, family stated they are worried that if [facility] transports that the costs may be too high.  Per RN, pt's family wants pt to remain until Monday when the transportation service they use is available to transport the pt.  Family also states a barrier to our using [facility] is they feel their transportation service is skilled in transporting pt's with back surgery histories, per pt's RN.  Of note: RN Crystal [phone] PA Jeneen Rinks Provider.  Family is asking to speak to "someone" and CSW was called.  CSW will continue to follow for D/C needs.  Alphonse Guild. Zephyr Sausedo, LCSW, LCAS, CSI Clinical Social Worker Ph: [phone]

## 2018-06-24 NOTE — Discharge Summary (Signed)
Patient ID: Raymond Barrera MRN: [id number] DOB/AGE: 02-01-1961 57 y.o.  Admit date: 06/16/2018 Discharge date: 06/19/2018  Admission Diagnoses:  Active Problems:   Radiculopathy   Discharge Diagnoses:  Same  Past Medical History:  Diagnosis Date  . Anxiety    due to the stroke  . Arthritis   . Back pain    hx of buldging disc  . Diabetes mellitus without complication ([place])    takes Metformin daily  . High cholesterol    takes Zocor daily  . Hypertension    takes Benazepril and HCTZ  daily  . Joint pain   . Joint swelling   . Memory impairment    occassional - from stroke  . Myocardial infarction ([place]) 1987  . Pneumonia    hx of-80's  . PONV (postoperative nausea and vomiting)    took a while for him to wake up after previous anesthesia  . Shortness of breath dyspnea    do to pain  . Sleep apnea    never had a sleep study,but states Dr. Cindie Laroche says he has it  . Slurred speech   . Stroke ([place]) 08/2013   7 mini-strokes, last stroke 2017  . TIA (transient ischemic attack) 2014   x 7   . Urinary frequency     Surgeries: Procedure(s): LUMBAR 4-5 LUMBAR 5 - SACRUM 1 POSTERIOR SPINAL FUSION WITH INSTRUMENTATION AND ALLOGRAFT on 06/17/2018   Consultants: Treatment Team:  Rosetta Posner, MD  Discharged Condition: Improved  Hospital Course: Raymond Barrera is an 57 y.o. male who was admitted 06/16/2018 for operative treatment of radiculopathy. Patient has severe unremitting pain that affects sleep, daily activities, and work/hobbies. After pre-op clearance the patient was taken to the operating room on 06/17/2018 and underwent  Procedure(s): LUMBAR 4-5 LUMBAR 5 - SACRUM 1 POSTERIOR SPINAL FUSION WITH INSTRUMENTATION AND ALLOGRAFT.    Patient was given perioperative antibiotics:  Anti-infectives (From admission, onward)   Start     Dose/Rate Route Frequency Ordered Stop   06/17/18 0700  ceFAZolin (ANCEF) IVPB 2g/100 mL premix  Status:  Discontinued     2 g 200  mL/hr over 30 Minutes Intravenous To ShortStay Surgical 06/15/18 0909 06/16/18 1535   06/16/18 2200  ceFAZolin (ANCEF) IVPB 2g/100 mL premix     2 g 200 mL/hr over 30 Minutes Intravenous Every 8 hours 06/16/18 1615 06/17/18 0525   06/16/18 1228  vancomycin (VANCOCIN) powder  Status:  Discontinued       As needed 06/16/18 1228 06/16/18 1349   06/16/18 0800  ceFAZolin (ANCEF) IVPB 2g/100 mL premix     2 g 200 mL/hr over 30 Minutes Intravenous To ShortStay Surgical 06/15/18 0901 06/16/18 1300       Patient was given sequential compression devices, early ambulation to prevent DVT.  Patient benefited maximally from hospital stay and there were no complications.    Recent vital signs: No data found. BP 129/77 (BP Location: Right Arm)   Pulse 89   Temp 98.1 F (36.7 C) (Oral)   Resp 18   Ht 6\' 6"  (1.981 m)   Wt 116.1 kg (255 lb 15.3 oz)   SpO2 97%   BMI 29.58 kg/m   Discharge Medications:   Allergies as of 06/19/2018      Reactions   Poison Ivy Extract [poison Ivy Extract] Itching, Rash      Medication List    TAKE these medications   amLODipine 10 MG tablet Commonly known as:  NORVASC  Take 10 mg by mouth daily.   aspirin 81 MG tablet Take 1 tablet (81 mg total) by mouth daily.   benazepril 20 MG tablet Commonly known as:  LOTENSIN Take 20 mg by mouth daily.   diazepam 5 MG tablet Commonly known as:  VALIUM Take 1 tablet (5 mg total) by mouth every 6 (six) hours as needed for muscle spasms.   Fish Oil 1000 MG Caps Take 1,000 mg by mouth daily.   folic acid 588 MCG tablet Commonly known as:  FOLVITE Take 400 mcg by mouth daily.   gabapentin 300 MG capsule Commonly known as:  NEURONTIN Take 300 mg by mouth 3 (three) times daily.   glucose 4 GM chewable tablet Chew 1 tablet by mouth as needed for low blood sugar.   IRON 27 240 (27 FE) MG tablet Generic drug:  ferrous gluconate Take 240 mg by mouth daily.   labetalol 200 MG tablet Commonly known as:   NORMODYNE Take 200 mg by mouth 2 (two) times daily.   metFORMIN 500 MG tablet Commonly known as:  GLUCOPHAGE Take 500 mg by mouth 2 (two) times daily with a meal.   multivitamin with minerals Tabs tablet Take 1 tablet by mouth daily.   oxyCODONE-acetaminophen 5-325 MG tablet Commonly known as:  PERCOCET/ROXICET Take 1-2 tablets by mouth every 4 (four) hours as needed for severe pain. What changed:    how much to take  reasons to take this   simvastatin 40 MG tablet Commonly known as:  ZOCOR Take 40 mg by mouth daily.   vitamin E 200 UNIT capsule Take 200 Units by mouth daily.       Diagnostic Studies: Dg Chest 2 View  Result Date: 06/10/2018 CLINICAL DATA:  Preop for back surgery. EXAM: CHEST - 2 VIEW COMPARISON:  Radiograph of February 24, 2017. FINDINGS: Stable cardiomediastinal silhouette. No pneumothorax or pleural effusion is noted. Right lung is clear. Stable elevated left hemidiaphragm is noted with mild left basilar subsegmental atelectasis or scarring. Bony thorax is unremarkable. IMPRESSION: Stable elevated left hemidiaphragm with mild left basilar subsegmental atelectasis or scarring. Electronically Signed   By: Marijo Conception, M.D.   On: 06/10/2018 12:11   Dg Lumbar Spine 2-3 Views  Result Date: 06/17/2018 CLINICAL DATA:  Posterior fusion of L4-5 and L5-S1 EXAM: LUMBAR SPINE - 2-3 VIEW COMPARISON:  MR lumbar spine of 01/14/2018 FINDINGS: Two C-arm spot films show hardware placed for posterior fusion of L4-5 and L5-S1. Interbody fusion plugs appear to be in good position with no complicating features. IMPRESSION: Posterior fusion of L4-5 and L5-S1.  No complicating features. Electronically Signed   By: Ivar Drape M.D.   On: 06/17/2018 11:52   Dg Lumbar Spine 2-3 Views  Result Date: 06/16/2018 CLINICAL DATA:  Fusion EXAM: LUMBAR SPINE - 2-3 VIEW COMPARISON:  MR 01/14/2018 FINDINGS: 2 intraoperative fluoroscopic spot images document changes of anterior instrumented  interbody fusion L4-5 and L5-S1. IMPRESSION: Instrumented lumbar fusion L4-S1. Electronically Signed   By: Lucrezia Europe M.D.   On: 06/16/2018 14:55   Dg C-arm 1-60 Min  Result Date: 06/17/2018 CLINICAL DATA:  Posterior fusion of L4-5 and L5-S1 EXAM: DG C-ARM 61-120 MIN COMPARISON:  MR lumbar spine of 01/14/2018 FINDINGS: C-arm fluoroscopy was provided during posterior fusion of L4-5 and L5-S1. Fluoroscopy time of 4 minutes 23 seconds was recorded. IMPRESSION: C-arm fluoroscopy provided. Electronically Signed   By: Ivar Drape M.D.   On: 06/17/2018 11:51   Dg C-arm 1-60 Min  Result Date: 06/17/2018 CLINICAL DATA:  L4-5 L5-S1 posterior fusion EXAM: DG C-ARM 61-120 MIN COMPARISON:  MR lumbar spine of 01/14/2018 FINDINGS: C-arm fluoroscopy was provided during posterior fusion of L4-5 and L5-S1. Fluoroscopy time of 4 minutes 23 seconds was recorded. IMPRESSION: C-arm fluoroscopy provided Electronically Signed   By: Ivar Drape M.D.   On: 06/17/2018 11:51   Dg C-arm 1-60 Min  Result Date: 06/16/2018 CLINICAL DATA:  L4-S1 ALIF EXAM: DG C-ARM 61-120 MIN COMPARISON:  None. FINDINGS: Two fluoroscopic images were obtained during anterior lumbar interbody fusion at L4-S1. There is no hardware abnormality. Normal alignment. IMPRESSION: Intraoperative fluoroscopy. Electronically Signed   By: Ulyses Jarred M.D.   On: 06/16/2018 14:54   Dg Or Local Abdomen  Result Date: 06/16/2018 CLINICAL DATA:  L4-S1 ALIF. EXAM: OR LOCAL ABDOMEN COMPARISON:  Intraoperative x-rays from same day. FINDINGS: Postsurgical changes related to L4-S1 anterior and interbody fusion. Prior bilateral total hip arthroplasties. No evidence of retained radiopaque foreign body. IMPRESSION: Interval L4-S1 ALIF. No evidence of retained radiopaque foreign body. These results were called by telephone at the time of interpretation on 06/16/2018 at 1:05 pm to the OR. Electronically Signed   By: Titus Dubin M.D.   On: 06/16/2018 13:06    Disposition:    Discharge Instructions    Call MD / Call 911   Complete by:  As directed    If you experience chest pain or shortness of breath, CALL 911 and be transported to the hospital emergency room.  If you develope a fever above 101 F, pus (white drainage) or increased drainage or redness at the wound, or calf pain, call your surgeon's office.   Diet Carb Modified   Complete by:  As directed    Face-to-face encounter (required for Medicare/Medicaid patients)   Complete by:  As directed    I Erlene Senters certify that this patient is under my care and that I, or a nurse practitioner or physician's assistant working with me, had a face-to-face encounter that meets the physician face-to-face encounter requirements with this patient on 06/19/2018. The encounter with the patient was in whole, or in part for the following medical condition(s) which is the primary reason for home health care (List medical condition): status post anterior/posterior lumbar fusion   The encounter with the patient was in whole, or in part, for the following medical condition, which is the primary reason for home health care:  status post anterior/posterior lumbar fusion   I certify that, based on my findings, the following services are medically necessary home health services:   Nursing Physical therapy     Reason for Medically Necessary Home Health Services:   Skilled Nursing- Skilled Assessment/Observation Skilled Nursing- Post-Surgical Wound Assessment and Care Therapy- Personnel officer, Training and development officer and Stair Training Therapy- Therapeutic Exercises to Increase Strength and Endurance     My clinical findings support the need for the above services:   Pain interferes with ambulation/mobility Unable to leave home safely without assistance and/or assistive device     Further, I certify that my clinical findings support that this patient is homebound due to:   Ambulates short distances less than 300 feet Pain interferes with  ambulation/mobility     Home Health   Complete by:  As directed    To provide the following care/treatments:   PT OT [place] work     Increase activity slowly as tolerated   Complete by:  As directed  Lifting restrictions   Complete by:  As directed    No bending, lifting, or twisting.     POD s/p A/P fusion, doing well  - up with PT/OT, encourage ambulation - Percocet for pain, Valium for muscle spasms -Written scripts for pain signed and in chart -D/C instructions sheet printed and in chart -D/C today  -F/U in office 2 weeks   Signed: Justice Britain 06/24/2018, 9:19 AM

## 2018-07-08 ENCOUNTER — Other Ambulatory Visit: Payer: Self-pay | Admitting: Orthopedic Surgery

## 2018-07-08 DIAGNOSIS — R109 Unspecified abdominal pain: Principal | ICD-10-CM

## 2018-07-08 DIAGNOSIS — G8918 Other acute postprocedural pain: Secondary | ICD-10-CM

## 2018-07-08 DIAGNOSIS — R1900 Intra-abdominal and pelvic swelling, mass and lump, unspecified site: Secondary | ICD-10-CM

## 2018-07-10 ENCOUNTER — Other Ambulatory Visit: Payer: Self-pay

## 2018-07-10 ENCOUNTER — Emergency Department (HOSPITAL_COMMUNITY)
Admission: EM | Admit: 2018-07-10 | Discharge: 2018-07-10 | Disposition: A | Discharge status: Home / Self Care | Payer: Medicaid Other | Attending: Emergency Medicine | Admitting: Emergency Medicine

## 2018-07-10 ENCOUNTER — Emergency Department (HOSPITAL_COMMUNITY): Payer: Medicaid Other

## 2018-07-10 ENCOUNTER — Encounter (HOSPITAL_COMMUNITY): Payer: Self-pay | Admitting: Emergency Medicine

## 2018-07-10 DIAGNOSIS — Z8673 Personal history of transient ischemic attack (TIA), and cerebral infarction without residual deficits: Secondary | ICD-10-CM | POA: Diagnosis not present

## 2018-07-10 DIAGNOSIS — I10 Essential (primary) hypertension: Secondary | ICD-10-CM | POA: Diagnosis not present

## 2018-07-10 DIAGNOSIS — K862 Cyst of pancreas: Secondary | ICD-10-CM

## 2018-07-10 DIAGNOSIS — F17210 Nicotine dependence, cigarettes, uncomplicated: Secondary | ICD-10-CM | POA: Insufficient documentation

## 2018-07-10 DIAGNOSIS — N2889 Other specified disorders of kidney and ureter: Secondary | ICD-10-CM | POA: Insufficient documentation

## 2018-07-10 DIAGNOSIS — M96842 Postprocedural seroma of a musculoskeletal structure following a musculoskeletal system procedure: Secondary | ICD-10-CM | POA: Insufficient documentation

## 2018-07-10 DIAGNOSIS — Z7982 Long term (current) use of aspirin: Secondary | ICD-10-CM | POA: Diagnosis not present

## 2018-07-10 DIAGNOSIS — Z79899 Other long term (current) drug therapy: Secondary | ICD-10-CM | POA: Diagnosis not present

## 2018-07-10 DIAGNOSIS — R1904 Left lower quadrant abdominal swelling, mass and lump: Secondary | ICD-10-CM | POA: Diagnosis present

## 2018-07-10 LAB — URINALYSIS, ROUTINE W REFLEX MICROSCOPIC
Bacteria, UA: NONE SEEN
Bilirubin Urine: NEGATIVE
GLUCOSE, UA: NEGATIVE mg/dL
HGB URINE DIPSTICK: NEGATIVE
Ketones, ur: NEGATIVE mg/dL
LEUKOCYTES UA: NEGATIVE
Nitrite: NEGATIVE
PH: 5 (ref 5.0–8.0)
Protein, ur: 30 mg/dL — AB
SPECIFIC GRAVITY, URINE: 1.026 (ref 1.005–1.030)

## 2018-07-10 LAB — CBC WITH DIFFERENTIAL/PLATELET
BASOS ABS: 0 10*3/uL (ref 0.0–0.1)
BASOS PCT: 0 %
EOS PCT: 2 %
Eosinophils Absolute: 0.1 10*3/uL (ref 0.0–0.7)
HCT: 37.9 % — ABNORMAL LOW (ref 39.0–52.0)
Hemoglobin: 12.7 g/dL — ABNORMAL LOW (ref 13.0–17.0)
Lymphocytes Relative: 20 %
Lymphs Abs: 1.5 10*3/uL (ref 0.7–4.0)
MCH: 30.2 pg (ref 26.0–34.0)
MCHC: 33.5 g/dL (ref 30.0–36.0)
MCV: 90 fL (ref 78.0–100.0)
MONO ABS: 0.5 10*3/uL (ref 0.1–1.0)
Monocytes Relative: 6 %
Neutro Abs: 5.5 10*3/uL (ref 1.7–7.7)
Neutrophils Relative %: 72 %
PLATELETS: 245 10*3/uL (ref 150–400)
RBC: 4.21 MIL/uL — ABNORMAL LOW (ref 4.22–5.81)
RDW: 14.2 % (ref 11.5–15.5)
WBC: 7.5 10*3/uL (ref 4.0–10.5)

## 2018-07-10 LAB — COMPREHENSIVE METABOLIC PANEL
ALT: 13 U/L (ref 0–44)
AST: 15 U/L (ref 15–41)
Albumin: 3.6 g/dL (ref 3.5–5.0)
Alkaline Phosphatase: 95 U/L (ref 38–126)
Anion gap: 7 (ref 5–15)
BUN: 17 mg/dL (ref 6–20)
CO2: 27 mmol/L (ref 22–32)
CREATININE: 1.3 mg/dL — AB (ref 0.61–1.24)
Calcium: 9.7 mg/dL (ref 8.9–10.3)
Chloride: 105 mmol/L (ref 98–111)
GFR, EST NON AFRICAN AMERICAN: 59 mL/min — AB (ref >60)
Glucose, Bld: 142 mg/dL — ABNORMAL HIGH (ref 70–99)
POTASSIUM: 3.8 mmol/L (ref 3.5–5.1)
Sodium: 139 mmol/L (ref 135–145)
TOTAL PROTEIN: 7.4 g/dL (ref 6.5–8.1)
Total Bilirubin: 0.7 mg/dL (ref 0.3–1.2)

## 2018-07-10 LAB — LIPASE, BLOOD: LIPASE: 33 U/L (ref 11–51)

## 2018-07-10 MED ORDER — OXYCODONE-ACETAMINOPHEN 5-325 MG PO TABS: 1.0000 | ORAL_TABLET | ORAL | 0 refills | Status: DC | PRN

## 2018-07-10 MED ORDER — SODIUM CHLORIDE 0.9 % IV BOLUS
500.0000 mL | Freq: Once | INTRAVENOUS | Status: AC
  Administered 2018-07-10: 500 mL via INTRAVENOUS

## 2018-07-10 MED ORDER — IOPAMIDOL (ISOVUE-300) INJECTION 61%
100.0000 mL | Freq: Once | INTRAVENOUS | Status: AC | PRN
  Administered 2018-07-10: 100 mL via INTRAVENOUS

## 2018-07-10 MED ORDER — ONDANSETRON HCL 4 MG/2ML IJ SOLN
4.0000 mg | Freq: Once | INTRAMUSCULAR | Status: AC
  Administered 2018-07-10: 4 mg via INTRAVENOUS
  Filled 2018-07-10: qty 2

## 2018-07-10 MED ORDER — MORPHINE SULFATE (PF) 4 MG/ML IV SOLN
4.0000 mg | Freq: Once | INTRAVENOUS | Status: AC
  Administered 2018-07-10: 4 mg via INTRAVENOUS
  Filled 2018-07-10: qty 1

## 2018-07-10 MED ORDER — ONDANSETRON HCL 4 MG/2ML IJ SOLN
4.0000 mg | Freq: Once | INTRAMUSCULAR | Status: DC
  Filled 2018-07-10: qty 2

## 2018-07-10 NOTE — ED Provider Notes (Signed)
[facility] EMERGENCY DEPARTMENT Provider Note   CSN: [id number] Arrival date & time: 07/10/18  1300   History   Chief Complaint Chief Complaint  Patient presents with  . Abdominal Pain    HPI Raymond Barrera is a 57 y.o. male with a history of lumbar radiculopathy and is post op day 24 from an anterior approach L4-5 fusion with Dr. Lynann Bologna. Patient states at 10 days post- op he developed a gradually enlarging mass under his surgical incision. Patient had a post op appointment with Dr. Lynann Bologna on 07/01/2018 where a CT was ordered. Per patient, they have not been able to schedule the CT because of insurance difficulties. Patient presents here with his wife due to masses increasing size. Denies fever, chills, vomiting, diarrhea, constipation, erythema, warmth. Admits to pain with palpation of the mass. Rates his pain a 9/10 and describes his pain as "a tight feeling." Pain does not radiate.   Past Medical History:  Diagnosis Date  . Anxiety    due to the stroke  . Arthritis   . Back pain    hx of buldging disc  . Diabetes mellitus without complication ([place])    takes Metformin daily  . High cholesterol    takes Zocor daily  . Hypertension    takes Benazepril and HCTZ  daily  . Joint pain   . Joint swelling   . Memory impairment    occassional - from stroke  . Myocardial infarction ([place]) 1987  . Pneumonia    hx of-80's  . PONV (postoperative nausea and vomiting)    took a while for him to wake up after previous anesthesia  . Shortness of breath dyspnea    do to pain  . Sleep apnea    never had a sleep study,but states Dr. Cindie Laroche says he has it  . Slurred speech   . Stroke ([place]) 08/2013   7 mini-strokes, last stroke 2017  . TIA (transient ischemic attack) 2014   x 7   . Urinary frequency     Patient Active Problem List   Diagnosis Date Noted  . Radiculopathy 06/16/2018  . Acute CVA (cerebrovascular accident) ([place]) 02/01/2017  . Diabetes mellitus ([place]) 02/01/2017    . HTN (hypertension) 02/01/2017  . Primary osteoarthritis of left hip 01/08/2016  . DJD (degenerative joint disease) 03/20/2015  . Primary osteoarthritis of right hip 02/26/2015  . Lumbar spondylosis 10/17/2014  . Lumbago 07/25/2014  . Abnormality of gait 07/25/2014  . Difficulty in walking(719.7) 07/25/2014  . TIA (transient ischemic attack) 03/09/2013  . Cocaine abuse ([place]) 03/09/2013  . Accelerated hypertension 03/09/2013  . Hyperlipidemia 03/09/2013  . Current smoker 03/09/2013    Past Surgical History:  Procedure Laterality Date  . ABDOMINAL EXPOSURE N/A 06/16/2018   Procedure: ABDOMINAL EXPOSURE;  Surgeon: Rosetta Posner, MD;  Location: [place];  Service: Vascular;  Laterality: N/A;  . ANKLE SURGERY  2008   left ankle-otif-[facility]  . ANTERIOR LUMBAR FUSION Bilateral 06/16/2018   Procedure: LUMBAR 4-5 LUMBAR 5-SACRUM 1 ANTERIOR LUMBAR INTERBODY FUSION WITH INSTRUMENTATION AND ALLOGRAFT;  Surgeon: Phylliss Bob, MD;  Location: [place];  Service: Orthopedics;  Laterality: Bilateral;  . BACK SURGERY    . JOINT REPLACEMENT     both hips replaced   . LUMBAR LAMINECTOMY/DECOMPRESSION MICRODISCECTOMY Right 10/17/2014   Procedure: LUMBAR LAMINECTOMY/DECOMPRESSION MICRODISCECTOMY 2 LEVELS;  Surgeon: [name], MD;  Location: [place] [facility];  Service: Neurosurgery;  Laterality: Right;  Right L45 L5S1 laminectomy and foraminotomy  . MASS  EXCISION  09/13/2012   Procedure: EXCISION MASS;  Surgeon: Jamesetta So, MD;  Location: [facility];  Service: General;  Laterality: N/A;  . TONSILLECTOMY    . TOTAL HIP ARTHROPLASTY Right 03/20/2015  . TOTAL HIP ARTHROPLASTY Right 03/20/2015   Procedure: TOTAL HIP ARTHROPLASTY ANTERIOR APPROACH;  Surgeon: Renette Butters, MD;  Location: [place];  Service: Orthopedics;  Laterality: Right;  . TOTAL HIP ARTHROPLASTY Left 01/08/2016   Procedure: TOTAL HIP ARTHROPLASTY ANTERIOR APPROACH;  Surgeon: Renette Butters, MD;  Location: [place];  Service: Orthopedics;   Laterality: Left;        Home Medications    Prior to Admission medications   Medication Sig Start Date End Date Taking? Authorizing Provider  amLODipine (NORVASC) 10 MG tablet Take 10 mg by mouth daily.   Yes [provider]  benazepril (LOTENSIN) 20 MG tablet Take 20 mg by mouth daily.   Yes [provider]  cephALEXin (KEFLEX) 500 MG capsule Take 500 mg by mouth 4 (four) times daily.   Yes [provider]  diazepam (VALIUM) 5 MG tablet Take 1 tablet (5 mg total) by mouth every 6 (six) hours as needed for muscle spasms. 06/19/18  Yes Gary Fleet, PA-C  ferrous gluconate (IRON 27) 240 (27 FE) MG tablet Take 240 mg by mouth daily.   Yes [provider]  folic acid (FOLVITE) 614 MCG tablet Take 400 mcg by mouth daily.   Yes [provider]  gabapentin (NEURONTIN) 300 MG capsule Take 300 mg by mouth 3 (three) times daily.   Yes [provider]  glucose 4 GM chewable tablet Chew 1 tablet by mouth as needed for low blood sugar.   Yes [provider]  labetalol (NORMODYNE) 200 MG tablet Take 200 mg by mouth 2 (two) times daily.   Yes [provider]  metFORMIN (GLUCOPHAGE) 500 MG tablet Take 500 mg by mouth 2 (two) times daily with a meal.   Yes [provider]  methocarbamol (ROBAXIN) 500 MG tablet Take 500 mg by mouth 4 (four) times daily as needed for muscle spasms.   Yes [provider]  Multiple Vitamin (MULTIVITAMIN WITH MINERALS) TABS tablet Take 1 tablet by mouth daily.   Yes [provider]  naproxen (NAPROSYN) 500 MG tablet Take 500 mg by mouth 2 (two) times daily as needed for mild pain.   Yes [provider]  Omega-3 Fatty Acids (FISH OIL) 1000 MG CAPS Take 1,000 mg by mouth daily.   Yes [provider]  oxyCODONE-acetaminophen (PERCOCET/ROXICET) 5-325 MG tablet Take 1-2 tablets by mouth every 4 (four) hours as needed for severe pain. 06/19/18  Yes Gary Fleet, PA-C    simvastatin (ZOCOR) 40 MG tablet Take 40 mg by mouth daily.   Yes [provider]  vitamin E 200 UNIT capsule Take 200 Units by mouth daily.   Yes [provider]  aspirin 81 MG tablet Take 1 tablet (81 mg total) by mouth daily. 06/14/18   Croitoru, Dani Gobble, MD    Family History Family History  Problem Relation Age of Onset  . Heart disease Father     Social History Social History   Tobacco Use  . Smoking status: Current Every Day Smoker    Packs/day: 0.25    Years: 47.00    Pack years: 11.75    Types: Cigarettes  . Smokeless tobacco: Never Used  Substance Use Topics  . Alcohol use: No    Comment: quit 2012  .  Drug use: Not Currently    Types: Cocaine    Comment: many yrs ago., last time- late 2016     Allergies   Poison ivy extract [poison ivy extract]   Review of Systems Review of Systems  Constitutional: Negative for chills and fever.  Respiratory: Negative for chest tightness and shortness of breath.   Cardiovascular: Negative for chest pain, palpitations and leg swelling.  Gastrointestinal: Positive for abdominal pain and nausea. Negative for blood in stool, constipation, diarrhea and vomiting.  Genitourinary: Negative for difficulty urinating, dysuria, hematuria and urgency.  Skin: Negative for color change, pallor, rash and wound.  Neurological: Negative for dizziness, weakness and light-headedness.  All other systems reviewed and are negative.    Physical Exam Updated Vital Signs BP (!) 147/87 (BP Location: Right Arm)   Pulse 87   Temp 98.1 F (36.7 C) (Oral)   Ht 6\' 6"  (1.981 m)   Wt 113.4 kg   SpO2 99%   BMI 28.89 kg/m   Physical Exam  Constitutional: He appears well-developed and well-nourished.  Non-toxic appearance. He does not appear ill. No distress.  HENT:  Head: Normocephalic and atraumatic.  Eyes: Pupils are equal, round, and reactive to light.  Neck: Normal range of motion. Neck supple.  Cardiovascular: Normal rate,  regular rhythm, normal heart sounds and intact distal pulses.  Pulmonary/Chest: Effort normal and breath sounds normal. No respiratory distress.  Abdominal: Soft. Bowel sounds are normal. There is no hepatosplenomegaly. There is no rebound and no guarding.  10x10cm circular mass in the LLQ under the surgical site incision. No area of erythema, warmth. Tenderness to palpation of the mass.   Musculoskeletal: Normal range of motion.  Neurological: He is alert.  Skin: Skin is warm and dry. He is not diaphoretic.  Psychiatric: He has a normal mood and affect.  Nursing note and vitals reviewed.    ED Treatments / Results  Labs (all labs ordered are listed, but only abnormal results are displayed) Labs Reviewed  CBC WITH DIFFERENTIAL/PLATELET - Abnormal; Notable for the following components:      Result Value   RBC 4.21 (*)    Hemoglobin 12.7 (*)    HCT 37.9 (*)    All other components within normal limits  COMPREHENSIVE METABOLIC PANEL  LIPASE, BLOOD  URINALYSIS, ROUTINE W REFLEX MICROSCOPIC    EKG None  Radiology No results found.  Procedures Procedures (including critical care time)  Medications Ordered in ED Medications  sodium chloride 0.9 % bolus 500 mL (has no administration in time range)  morphine 4 MG/ML injection 4 mg (4 mg Intravenous Given 07/10/18 1455)  ondansetron (ZOFRAN) injection 4 mg (4 mg Intravenous Given 07/10/18 1455)     Initial Impression / Assessment and Plan / ED Course  I have reviewed the triage vital signs and the nursing notes as well as the patients past medical history.   Pertinent labs & imaging results that were available during my care of the patient were reviewed by me and considered in my medical decision making (see chart for details)  Patient is nontoxic, nonseptic appearing, afebrile in no apparent distress. Hemaglobin 12.7, was 13.7 06/20/18. No leukocytosis on labs. Normal appetite. Abdomen soft and has a non-surgical abdomen. CT  ordered to further evaluate whether hematoma, seroma or abscess. Low suspicion for incarcerated hernia or aneurysm. Given Morphine and Zofran in ED. Nausea resolved at recheck.   Patient care transfered to Dr. Crosby Oyster.       Final Clinical Impressions(s) /  ED Diagnoses   Final diagnoses:  None    ED Discharge Orders    None       Henderly, Britni A, PA-C 07/10/18 1615    Noemi Chapel, MD 07/13/18 0930

## 2018-07-10 NOTE — ED Provider Notes (Signed)
5:30 PM-evaluation post imaging, with CT to evaluate for possible postoperative seroma.  CT does indicate seroma.  Clinical Course as of Jul 10 1917  Sat Jul 10, 2018  1731 Normal  Urinalysis, Routine w reflex microscopic(!) [EW]  1732 Normal  Lipase, blood [EW]  1732 Normal except hemoglobin low 12.7  CBC with Diff(!) [EW]  1732 Normal except glucose high 142, creatinine high  Comprehensive metabolic panel(!) [EW]  0258 Primary result does indicate seroma, large, fascial level, displacing rectus abdominal musculature.  Also evident left renal mass consistent with carcinoma.  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  Images reviewed by me  CT ABDOMEN PELVIS W CONTRAST [EW]  5277 Case discussed with general orthopedics and orthopedic spine surgeon, Dr. Lynann Bologna.  He is in agreement with outpatient treatment for postoperative seroma, using the services of interventional radiology.   [EW]  8242 Case discussed with interventional radiologist, Dr. Kathlene Cote, who will help arrange for IR treatment of abdominal wall seroma, Foley in 2 to 3 days time.  Dr. Kathlene Cote will handle getting the order placed.  I was able to send him the patient's demographic information for contact to arrange the treatment time.   [EW]    Clinical Course User Index [EW] Daleen Bo, MD    Ct Abdomen Pelvis W Contrast  Result Date: 07/10/2018 CLINICAL DATA:  Generalized abdominal pain with swelling beginning since a back fusions surgery in mid July. EXAM: CT ABDOMEN AND PELVIS WITH CONTRAST TECHNIQUE: Multidetector CT imaging of the abdomen and pelvis was performed using the standard protocol following bolus administration of intravenous contrast. CONTRAST:  170mL ISOVUE-300 IOPAMIDOL (ISOVUE-300) INJECTION 61% COMPARISON:  None. FINDINGS: Lower chest: No acute abnormality. Hepatobiliary: No focal liver abnormality is seen. No gallstones, gallbladder wall thickening, or biliary  dilatation. Pancreas: 8 mm cystic mass from the distal body. No other pancreatic abnormality. No inflammation. Spleen: Normal in size without focal abnormality. Adrenals/Urinary Tract: No adrenal masses. Heterogeneous enhancing mass arises from the anterior midpole the left kidney measuring 4.0 x 3.9 x 3.5 cm. Tiny low-density lesion in the midpole the right kidney most likely a cyst. No other renal masses or lesions. No stones. No hydronephrosis. Ureters are normal in course and in caliber. Bladder is unremarkable. Stomach/Bowel: Stomach is within normal limits. Appendix appears normal. No evidence of bowel wall thickening, distention, or inflammatory changes. Vascular/Lymphatic: Aortic atherosclerosis, relatively mild. No aneurysm. There is soft tissue attenuation adjacent to the left common iliac vessels that is likely scarring from the prior lumbar surgery. No discrete enlarged lymph nodes. Reproductive: Unremarkable. Prostate assessment limited by artifact from bilateral hip prostheses. Other: There is a large complex fluid collection in the left anterior abdominal wall centered on the fascia, displacing the rectus abdominus muscle posteriorly and to the right. The collection measures 14.2 x 9.1 x 7.1 cm. No ascites.  No intra-abdominal fluid collection. Musculoskeletal: Bilateral total hip arthroplasties appear well seated and aligned. Bilateral pedicle screws at L4, L5 and S1 appear well seated and well position. Bone graft material partly maintains disc height at the fused levels. Anterior screws are noted in L4 and L5, well-seated. No acute fracture. No osteoblastic or osteolytic lesions. IMPRESSION: 1. Large anterior abdominal wall fluid collection which displaces the underlying rectus abdominus muscle. This measures 14.2 x 9 x 1 x 7.1 cm. It is likely a postoperative seroma. It could be infected if there consistent clinical symptoms. 2. 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 This is contained within Gerota's fascia. No evidence renal vein involvement. No adenopathy. 3. 9 mm cystic mass from the distal pancreatic body. Current consensus guidelines recommends follow-up CT imaging yearly for 5 years. Electronically Signed   By: Lajean Manes M.D.   On: 07/10/2018 16:36    Patient Vitals for the past 24 hrs:  BP Temp Temp src Pulse SpO2 Height Weight  07/10/18 1312 (!) 147/87 98.1 F (36.7 C) Oral 87 99 % - -  07/10/18 1311 - - - - - 6\' 6"  (1.981 m) 113.4 kg    7:19 PM Reevaluation with update and discussion. After initial assessment and treatment, an updated evaluation reveals patient is very comfortable.  Abdomen is mildly tender to palpation at the site of his seroma.  There is no overlying skin abnormality.  Findings discussed in detail, with detailed planning arranged with family members.  All questions answered. Daleen Bo   Medical Decision Making: Patient with postoperative seroma, associated with fascial muscle closure.  No sign of infected seroma at this time.  Doubt sepsis, metabolic instability, deep surgical wound infection or other surgical complication.  Incidental findings of pancreatic cyst, which can be evaluated expectantly by primary care, and a left renal mass consistent with carcinoma, but will need urology follow-up as an outpatient.  The seroma requires interventional radiology treatment in [facility].  The renal mass can be handled by the urology service come here in [facility].  CRITICAL CARE- yes Performed by: Daleen Bo  .Critical Care Performed by: Daleen Bo, MD Authorized by: Daleen Bo, MD   Critical care provider statement:    Critical care time (minutes):  35   Critical care start time:  07/10/2018 5:30 PM   Critical care end time:  07/10/2018 7:26 PM   Critical care time was exclusive of:  Separately billable procedures and treating other patients   Critical care was  necessary to treat or prevent imminent or life-threatening deterioration of the following conditions: Surgical wound complication.   Critical care was time spent personally by me on the following activities:  Blood draw for specimens, development of treatment plan with patient or surrogate, discussions with consultants, evaluation of patient's response to treatment, examination of patient, obtaining history from patient or surrogate, ordering and performing treatments and interventions, ordering and review of laboratory studies, pulse oximetry, re-evaluation of patient's condition, review of old charts and ordering and review of radiographic studies     Nursing Notes Reviewed/ Care Coordinated Applicable Imaging Reviewed Interpretation of Laboratory Data incorporated into ED treatment  The patient appears reasonably screened and/or stabilized for discharge and I doubt any other medical condition or other [facility] requiring further screening, evaluation, or treatment in the ED at this time prior to discharge.  Plan: Home Medications-continue current medications, additional analgesia, sent to his pharmacy; Home Treatments-rest, fluids; return here if the recommended treatment, does not improve the symptoms; Recommended follow up-IR follow-up as indicated above.  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     Daleen Bo, MD 07/10/18 Joen Laura

## 2018-07-10 NOTE — ED Triage Notes (Signed)
Patient c/o abd pain with swelling that started after having back fusion in which they entered abd cavity to do surgery. Denies any fevers, nausea, vomiting, or diarrhea. Per patient had normal BM yesterday-no blood noted. Surgeon aware of swelling and pain, patient scheduled to have CT next week, per patient had to wait for insurance to "clear before scheduling CT". Per patient swelling and pain progressively getting worse.

## 2018-07-10 NOTE — Discharge Instructions (Addendum)
You have a seroma associated with the surgical site, which is causing the pain and swelling in the left side of your abdomen.  We have contacted interventional radiology at [facility] in [place], [place].  They will contact you on Monday to arrange a time to be seen and have this treated at their department.  In the meantime make sure you are getting plenty of rest and eating and drinking regularly.  There were 2 incidental findings on the CAT scan which need follow-up.  The first is the pancreatic cyst which Dr. Cindie Laroche can help you with by ordering CAT scan follow-up every year.  The second is a left renal mass which is concerning for renal cell carcinoma.  This could be a cancer and likely requires evaluation and treatment by a urologist.  We have referred you to Dr. Jeffie Pollock, who has an office here in [place] and can help you with this problem.  Call him on Monday morning for a follow-up appointment to get evaluation and treatment for this  We have sent a prescription to your requested pharmacy.  Will be available tomorrow.  Return here, if needed, for problems.

## 2018-07-10 NOTE — ED Provider Notes (Signed)
Medical screening examination/treatment/procedure(s) were conducted as a shared visit with non-physician practitioner(s) and myself.  I personally evaluated the patient during the encounter.  Clinical Impression:   Final diagnoses:  None    The patient is a 57 year old male, he recently underwent an anterior approach lumbar fusion by Dr. Donnetta Hutching and Dr. Lynann Bologna, this was approximately 3 weeks ago, since that time he has had progressive swelling and pain in his left lower abdomen just left of the midline of his surgical scar which was left of the umbilicus.  On exam the patient does not fact have a tender rounded protruding area of the left mid to lower abdomen.  It is tender to palpation, he does not have any lymphadenopathy of the groin and has normal pulses at the femoral arteries bilaterally.  His right side of his abdomen is soft and nontender, he is not tachycardic or short of breath, he appears a little bit uncomfortable but does not appear to be in distress and has a nonsurgical abdomen.  He will need a CT scan to further evaluate the cause of this with her it is a seroma hematoma abscess, I doubt that this is an aneurysm or an incarcerated hernia.  The patient is agreeable.  Pain medications offered.  At change of shift, care signed out to Dr. Eulis Foster to follow-up CT scan and disposition accordingly      Noemi Chapel, MD 07/11/18 [phone]

## 2018-07-12 ENCOUNTER — Other Ambulatory Visit (HOSPITAL_COMMUNITY): Payer: Self-pay | Admitting: Interventional Radiology

## 2018-07-12 DIAGNOSIS — R188 Other ascites: Secondary | ICD-10-CM

## 2018-07-13 ENCOUNTER — Other Ambulatory Visit: Payer: Self-pay | Admitting: Student

## 2018-07-14 ENCOUNTER — Emergency Department (HOSPITAL_COMMUNITY): Payer: Medicaid Other

## 2018-07-14 ENCOUNTER — Encounter (HOSPITAL_COMMUNITY): Payer: Self-pay

## 2018-07-14 ENCOUNTER — Ambulatory Visit (HOSPITAL_COMMUNITY)
Admission: RE | Admit: 2018-07-14 | Discharge: 2018-07-14 | Disposition: A | Discharge status: Home / Self Care | Payer: Medicaid Other | Source: Ambulatory Visit | Attending: Interventional Radiology | Admitting: Interventional Radiology

## 2018-07-14 ENCOUNTER — Emergency Department (HOSPITAL_COMMUNITY)
Admission: EM | Admit: 2018-07-14 | Discharge: 2018-07-14 | Disposition: A | Discharge status: Home / Self Care | Payer: Medicaid Other | Attending: Emergency Medicine | Admitting: Emergency Medicine

## 2018-07-14 DIAGNOSIS — R1030 Lower abdominal pain, unspecified: Secondary | ICD-10-CM | POA: Diagnosis not present

## 2018-07-14 DIAGNOSIS — S301XXD Contusion of abdominal wall, subsequent encounter: Secondary | ICD-10-CM

## 2018-07-14 DIAGNOSIS — R410 Disorientation, unspecified: Secondary | ICD-10-CM | POA: Insufficient documentation

## 2018-07-14 DIAGNOSIS — R4182 Altered mental status, unspecified: Secondary | ICD-10-CM | POA: Diagnosis present

## 2018-07-14 DIAGNOSIS — R188 Other ascites: Secondary | ICD-10-CM

## 2018-07-14 DIAGNOSIS — L7634 Postprocedural seroma of skin and subcutaneous tissue following other procedure: Secondary | ICD-10-CM | POA: Insufficient documentation

## 2018-07-14 DIAGNOSIS — R109 Unspecified abdominal pain: Secondary | ICD-10-CM

## 2018-07-14 HISTORY — PX: IR US GUIDE BX ASP/DRAIN: IMG2392

## 2018-07-14 LAB — COMPREHENSIVE METABOLIC PANEL
ALK PHOS: 79 U/L (ref 38–126)
ALT: 16 U/L (ref 0–44)
AST: 17 U/L (ref 15–41)
Albumin: 3.2 g/dL — ABNORMAL LOW (ref 3.5–5.0)
Anion gap: 8 (ref 5–15)
BILIRUBIN TOTAL: 0.9 mg/dL (ref 0.3–1.2)
BUN: 17 mg/dL (ref 6–20)
CALCIUM: 9.3 mg/dL (ref 8.9–10.3)
CO2: 25 mmol/L (ref 22–32)
CREATININE: 1.35 mg/dL — AB (ref 0.61–1.24)
Chloride: 109 mmol/L (ref 98–111)
GFR calc Af Amer: >60 mL/min (ref >60)
GFR, EST NON AFRICAN AMERICAN: 56 mL/min — AB (ref >60)
Glucose, Bld: 137 mg/dL — ABNORMAL HIGH (ref 70–99)
Potassium: 3.9 mmol/L (ref 3.5–5.1)
Sodium: 142 mmol/L (ref 135–145)
TOTAL PROTEIN: 6.5 g/dL (ref 6.5–8.1)

## 2018-07-14 LAB — URINALYSIS, ROUTINE W REFLEX MICROSCOPIC
Bilirubin Urine: NEGATIVE
GLUCOSE, UA: NEGATIVE mg/dL
HGB URINE DIPSTICK: NEGATIVE
KETONES UR: NEGATIVE mg/dL
LEUKOCYTES UA: NEGATIVE
NITRITE: NEGATIVE
PH: 5 (ref 5.0–8.0)
Protein, ur: 100 mg/dL — AB
Specific Gravity, Urine: 1.02 (ref 1.005–1.030)

## 2018-07-14 LAB — PROTIME-INR
INR: 1.08
PROTHROMBIN TIME: 13.9 s (ref 11.4–15.2)

## 2018-07-14 LAB — I-STAT CG4 LACTIC ACID, ED
LACTIC ACID, VENOUS: 1.46 mmol/L (ref 0.5–1.9)
Lactic Acid, Venous: 1.65 mmol/L (ref 0.5–1.9)

## 2018-07-14 LAB — CBC WITH DIFFERENTIAL/PLATELET
Abs Immature Granulocytes: 0 10*3/uL (ref 0.0–0.1)
Basophils Absolute: 0 10*3/uL (ref 0.0–0.1)
Basophils Relative: 0 %
EOS ABS: 0.1 10*3/uL (ref 0.0–0.7)
Eosinophils Relative: 1 %
HCT: 36.8 % — ABNORMAL LOW (ref 39.0–52.0)
Hemoglobin: 11.7 g/dL — ABNORMAL LOW (ref 13.0–17.0)
IMMATURE GRANULOCYTES: 0 %
LYMPHS ABS: 1 10*3/uL (ref 0.7–4.0)
Lymphocytes Relative: 11 %
MCH: 29.3 pg (ref 26.0–34.0)
MCHC: 31.8 g/dL (ref 30.0–36.0)
MCV: 92.2 fL (ref 78.0–100.0)
MONO ABS: 0.4 10*3/uL (ref 0.1–1.0)
MONOS PCT: 5 %
NEUTROS PCT: 83 %
Neutro Abs: 7.8 10*3/uL — ABNORMAL HIGH (ref 1.7–7.7)
Platelets: 188 10*3/uL (ref 150–400)
RBC: 3.99 MIL/uL — ABNORMAL LOW (ref 4.22–5.81)
RDW: 13.7 % (ref 11.5–15.5)
WBC: 9.3 10*3/uL (ref 4.0–10.5)

## 2018-07-14 LAB — CBG MONITORING, ED: GLUCOSE-CAPILLARY: 251 mg/dL — AB (ref 70–99)

## 2018-07-14 LAB — APTT: aPTT: 30 seconds (ref 24–36)

## 2018-07-14 LAB — GLUCOSE, CAPILLARY: GLUCOSE-CAPILLARY: 398 mg/dL — AB (ref 70–99)

## 2018-07-14 MED ORDER — SODIUM CHLORIDE 0.9 % IV BOLUS (SEPSIS)
1000.0000 mL | Freq: Once | INTRAVENOUS | Status: AC
  Administered 2018-07-14: 1000 mL via INTRAVENOUS

## 2018-07-14 MED ORDER — MIDAZOLAM HCL 2 MG/2ML IJ SOLN
INTRAMUSCULAR | Status: AC | PRN
  Administered 2018-07-14: 1 mg via INTRAVENOUS

## 2018-07-14 MED ORDER — MIDAZOLAM HCL 2 MG/2ML IJ SOLN
INTRAMUSCULAR | Status: AC
  Filled 2018-07-14: qty 2

## 2018-07-14 MED ORDER — LIDOCAINE HCL 1 % IJ SOLN
INTRAMUSCULAR | Status: AC
  Filled 2018-07-14: qty 20

## 2018-07-14 MED ORDER — SODIUM CHLORIDE 0.9 % IV BOLUS (SEPSIS)
500.0000 mL | Freq: Once | INTRAVENOUS | Status: AC
  Administered 2018-07-14: 500 mL via INTRAVENOUS

## 2018-07-14 MED ORDER — PIPERACILLIN-TAZOBACTAM 3.375 G IVPB
3.3750 g | Freq: Three times a day (TID) | INTRAVENOUS | Status: DC
  Administered 2018-07-14: 3.375 g via INTRAVENOUS
  Filled 2018-07-14: qty 50

## 2018-07-14 MED ORDER — LIDOCAINE HCL (PF) 1 % IJ SOLN
INTRAMUSCULAR | Status: AC | PRN
  Administered 2018-07-14: 5 mL

## 2018-07-14 MED ORDER — FENTANYL CITRATE (PF) 100 MCG/2ML IJ SOLN
INTRAMUSCULAR | Status: AC | PRN
  Administered 2018-07-14: 50 ug via INTRAVENOUS

## 2018-07-14 MED ORDER — SODIUM CHLORIDE 0.9 % IV SOLN: INTRAVENOUS | Status: DC

## 2018-07-14 MED ORDER — PIPERACILLIN-TAZOBACTAM 3.375 G IVPB 30 MIN
3.3750 g | Freq: Once | INTRAVENOUS | Status: AC
  Administered 2018-07-14: 3.375 g via INTRAVENOUS
  Filled 2018-07-14: qty 50

## 2018-07-14 MED ORDER — DEXTROSE 50 % IV SOLN
INTRAVENOUS | Status: AC
  Filled 2018-07-14: qty 50

## 2018-07-14 MED ORDER — FENTANYL CITRATE (PF) 100 MCG/2ML IJ SOLN
INTRAMUSCULAR | Status: AC
  Filled 2018-07-14: qty 2

## 2018-07-14 NOTE — ED Provider Notes (Addendum)
[facility] EMERGENCY DEPARTMENT Provider Note   CSN: [id number] Arrival date & time: 07/14/18  2229     History   Chief Complaint No chief complaint on file.   HPI Raymond Barrera is a 57 y.o. male.  The history is provided by the spouse and medical records. No language interpreter was used.     57 year old male with history of diabetes, chronic back pain, prior MI, prior stroke, brought in by family member, sent down from interventional radiology lab for evaluation of altered mental status.  Patient is altered, level 5 caveats due to altered mental status.  History obtained through nursing note, and family members who are at bedside.  Patient had lumbar spinal fusion a month ago by Dr. Phylliss Bob.  A week later he developed pain and swelling at the surgical site on his abdomen.  It has since increased in size and become more more tender.  He had a CT scan obtained over a week ago showing evidence of a seroma.  He was also seen in the ED 4 days ago for progressive swelling and pain in his lower abdomen, repeat CT scan shows evidence of seroma.  Patient discharged home and was scheduled to have an ultrasound-guided aspiration of his seroma today.  When patient was at the floor for his procedure, nursing staff noted that patient was altered, diaphoretic and moaning pain.  Patient then brought here for further management.  Family member mention that patient has been complaining of pain discomfort and increasing swelling but has not had a fever.  He did took his normal medication this morning without food.  No report of coughing, or any other infectious symptoms leading to this event.  No report of shortness of breath.  Past Medical History:  Diagnosis Date  . Anxiety    due to the stroke  . Arthritis   . Back pain    hx of buldging disc  . Diabetes mellitus without complication ([place])    takes Metformin daily  . High cholesterol    takes Zocor daily  . Hypertension      takes Benazepril and HCTZ  daily  . Joint pain   . Joint swelling   . Memory impairment    occassional - from stroke  . Myocardial infarction ([place]) 1987  . Pneumonia    hx of-80's  . PONV (postoperative nausea and vomiting)    took a while for him to wake up after previous anesthesia  . Shortness of breath dyspnea    do to pain  . Sleep apnea    never had a sleep study,but states Dr. Cindie Laroche says he has it  . Slurred speech   . Stroke ([place]) 08/2013   7 mini-strokes, last stroke 2017  . TIA (transient ischemic attack) 2014   x 7   . Urinary frequency     Patient Active Problem List   Diagnosis Date Noted  . Radiculopathy 06/16/2018  . Acute CVA (cerebrovascular accident) ([place]) 02/01/2017  . Diabetes mellitus ([place]) 02/01/2017  . HTN (hypertension) 02/01/2017  . Primary osteoarthritis of left hip 01/08/2016  . DJD (degenerative joint disease) 03/20/2015  . Primary osteoarthritis of right hip 02/26/2015  . Lumbar spondylosis 10/17/2014  . Lumbago 07/25/2014  . Abnormality of gait 07/25/2014  . Difficulty in walking(719.7) 07/25/2014  . TIA (transient ischemic attack) 03/09/2013  . Cocaine abuse ([place]) 03/09/2013  . Accelerated hypertension 03/09/2013  . Hyperlipidemia 03/09/2013  . Current smoker 03/09/2013  Past Surgical History:  Procedure Laterality Date  . ABDOMINAL EXPOSURE N/A 06/16/2018   Procedure: ABDOMINAL EXPOSURE;  Surgeon: Rosetta Posner, MD;  Location: [facility];  Service: Vascular;  Laterality: N/A;  . ANKLE SURGERY  2008   left ankle-otif-[facility]  . ANTERIOR LUMBAR FUSION Bilateral 06/16/2018   Procedure: LUMBAR 4-5 LUMBAR 5-SACRUM 1 ANTERIOR LUMBAR INTERBODY FUSION WITH INSTRUMENTATION AND ALLOGRAFT;  Surgeon: Phylliss Bob, MD;  Location: [facility];  Service: Orthopedics;  Laterality: Bilateral;  . BACK SURGERY    . JOINT REPLACEMENT     both hips replaced   . LUMBAR LAMINECTOMY/DECOMPRESSION MICRODISCECTOMY Right 10/17/2014   Procedure: LUMBAR  LAMINECTOMY/DECOMPRESSION MICRODISCECTOMY 2 LEVELS;  Surgeon: [name], MD;  Location: [facility];  Service: Neurosurgery;  Laterality: Right;  Right L45 L5S1 laminectomy and foraminotomy  . MASS EXCISION  09/13/2012   Procedure: EXCISION MASS;  Surgeon: Jamesetta So, MD;  Location: [facility];  Service: General;  Laterality: N/A;  . TONSILLECTOMY    . TOTAL HIP ARTHROPLASTY Right 03/20/2015  . TOTAL HIP ARTHROPLASTY Right 03/20/2015   Procedure: TOTAL HIP ARTHROPLASTY ANTERIOR APPROACH;  Surgeon: Renette Butters, MD;  Location: [facility];  Service: Orthopedics;  Laterality: Right;  . TOTAL HIP ARTHROPLASTY Left 01/08/2016   Procedure: TOTAL HIP ARTHROPLASTY ANTERIOR APPROACH;  Surgeon: Renette Butters, MD;  Location: [facility];  Service: Orthopedics;  Laterality: Left;        Home Medications    Prior to Admission medications   Medication Sig Start Date End Date Taking? Authorizing Provider  amLODipine (NORVASC) 10 MG tablet Take 10 mg by mouth daily.    [provider]  aspirin 81 MG tablet Take 1 tablet (81 mg total) by mouth daily. 06/14/18   Croitoru, Mihai, MD  benazepril (LOTENSIN) 20 MG tablet Take 20 mg by mouth daily.    [provider]  cephALEXin (KEFLEX) 500 MG capsule Take 500 mg by mouth 4 (four) times daily.    [provider]  diazepam (VALIUM) 5 MG tablet Take 1 tablet (5 mg total) by mouth every 6 (six) hours as needed for muscle spasms. 06/19/18   Gary Fleet, PA-C  ferrous gluconate (IRON 27) 240 (27 FE) MG tablet Take 240 mg by mouth daily.    [provider]  folic acid (FOLVITE) 295 MCG tablet Take 400 mcg by mouth daily.    [provider]  gabapentin (NEURONTIN) 300 MG capsule Take 300 mg by mouth 3 (three) times daily.    [provider]  glucose 4 GM chewable tablet Chew 1 tablet by mouth as needed for low blood sugar.    [provider]  labetalol (NORMODYNE) 200 MG tablet Take 200 mg by mouth 2  (two) times daily.    [provider]  metFORMIN (GLUCOPHAGE) 500 MG tablet Take 500 mg by mouth 2 (two) times daily with a meal.    [provider]  methocarbamol (ROBAXIN) 500 MG tablet Take 500 mg by mouth 4 (four) times daily as needed for muscle spasms.    [provider]  Multiple Vitamin (MULTIVITAMIN WITH MINERALS) TABS tablet Take 1 tablet by mouth daily.    [provider]  naproxen (NAPROSYN) 500 MG tablet Take 500 mg by mouth 2 (two) times daily as needed for mild pain.    [provider]  Omega-3 Fatty Acids (FISH OIL) 1000 MG CAPS Take 1,000 mg by mouth daily.    [provider]  oxyCODONE-acetaminophen (PERCOCET)  5-325 MG tablet Take 1 tablet by mouth every 4 (four) hours as needed for severe pain. 07/10/18   Daleen Bo, MD  simvastatin (ZOCOR) 40 MG tablet Take 40 mg by mouth daily.    [provider]  vitamin E 200 UNIT capsule Take 200 Units by mouth daily.    [provider]    Family History Family History  Problem Relation Age of Onset  . Heart disease Father     Social History Social History   Tobacco Use  . Smoking status: Current Every Day Smoker    Packs/day: 0.25    Years: 47.00    Pack years: 11.75    Types: Cigarettes  . Smokeless tobacco: Never Used  Substance Use Topics  . Alcohol use: No    Comment: quit 2012  . Drug use: Not Currently    Types: Cocaine    Comment: many yrs ago., last time- late 2016     Allergies   Poison ivy extract [poison ivy extract]   Review of Systems Review of Systems  Unable to perform ROS: Mental status change     Physical Exam Updated Vital Signs There were no vitals taken for this visit.  Physical Exam  Constitutional: He appears well-developed and well-nourished.  Patient appears uncomfortable, moaning, diaphoretic and ill-appearing  HENT:  Head: Atraumatic.  Eyes: Pupils are equal, round, and reactive to light. Conjunctivae are  normal.  Neck: Normal range of motion. Neck supple.  No nuchal rigidity  Cardiovascular:  Tachycardia without murmur rubs or gallops  Pulmonary/Chest:  Decreased breath sounds without any overt wheezes, rales, or rhonchi.  Abdominal: Soft. He exhibits mass. There is tenderness (A firm mass to lower abdomen at the surgical site that is exquisitely tender to palpation but no surrounding skin erythema or warmth.). There is guarding.  Genitourinary:  Genitourinary Comments: No inguinal hernia  Neurological: GCS eye subscore is 3. GCS verbal subscore is 4. GCS motor subscore is 5.  Lethargic, responds to painful stimuli  Skin: No rash noted.  Psychiatric: He has a normal mood and affect.  Nursing note and vitals reviewed.    ED Treatments / Results  Labs (all labs ordered are listed, but only abnormal results are displayed) Labs Reviewed  URINALYSIS, ROUTINE W REFLEX MICROSCOPIC - Abnormal; Notable for the following components:      Result Value   APPearance HAZY (*)    Protein, ur 100 (*)    Bacteria, UA RARE (*)    All other components within normal limits  CBC WITH DIFFERENTIAL/PLATELET - Abnormal; Notable for the following components:   RBC 3.99 (*)    Hemoglobin 11.7 (*)    HCT 36.8 (*)    Neutro Abs 7.8 (*)    All other components within normal limits  COMPREHENSIVE METABOLIC PANEL - Abnormal; Notable for the following components:   Glucose, Bld 137 (*)    Creatinine, Ser 1.35 (*)    Albumin 3.2 (*)    GFR calc non Af Amer 56 (*)    All other components within normal limits  CBG MONITORING, ED - Abnormal; Notable for the following components:   Glucose-Capillary 251 (*)    All other components within normal limits  CULTURE, BLOOD (ROUTINE X 2)  CULTURE, BLOOD (ROUTINE X 2)  AEROBIC/ANAEROBIC CULTURE (SURGICAL/DEEP WOUND)  PROTIME-INR  APTT  CBC WITH DIFFERENTIAL/PLATELET  I-STAT CG4 LACTIC ACID, ED  I-STAT CG4 LACTIC ACID, ED    EKG None  Radiology Dg Chest 2  View  Result Date: 07/14/2018 CLINICAL DATA:  Diaphoresis and decreased mental status. Abdominal wall fluid collection, suspected abscess. EXAM: CHEST - 2 VIEW COMPARISON:  Chest radiographs 06/10/2018. FINDINGS: The heart size and mediastinal contours are stable. There is stable asymmetric elevation of the left hemidiaphragm with associated chronic left basilar atelectasis or scarring. The right lung is clear. There is no pleural effusion or pneumothorax. No acute osseous findings are evident. Telemetry leads overlie the chest. IMPRESSION: Stable chest with chronic left basilar atelectasis or scarring. No acute findings identified. Electronically Signed   By: Richardean Sale M.D.   On: 07/14/2018 08:59   Ct Head Wo Contrast  Result Date: 07/14/2018 CLINICAL DATA:  Altered mental status with loss of responsiveness EXAM: CT HEAD WITHOUT CONTRAST TECHNIQUE: Contiguous axial images were obtained from the base of the skull through the vertex without intravenous contrast. COMPARISON:  Head CT February 01, 2017 and brain MRI February 02, 2017 FINDINGS: Brain: The ventricles are normal in size and configuration for age. There is no intracranial mass, hemorrhage, extra-axial fluid collection, or midline shift. There is evidence of a prior infarct involving portions of the left putamen and posterior limb left internal capsule, showing evolution since prior studies. There is patchy small vessel disease in the centra semiovale bilaterally, stable. No acute infarct is demonstrated on this study. Dystrophic appearing calcification is noted slightly to the left of the posterior falx. No edema or mass effect in this area. Vascular: No hyperdense vessels are evident. There is calcification in each distal vertebral artery and carotid siphon region. Skull: Bony calvarium appears intact. Sinuses/Orbits: There is mucosal thickening in several ethmoid air cells bilaterally. Visualized paranasal sinuses elsewhere clear. Orbits appear  symmetric bilaterally. Other: Mastoid air cells are clear. IMPRESSION: Prior left basal ganglia infarct, showing evolution from prior studies. Patchy supratentorial small vessel disease appear stable. No acute infarct evident. No mass or hemorrhage. Foci of arterial vascular calcification noted. Mucosal thickening noted in several ethmoid air cells. Electronically Signed   By: Lowella Grip III M.D.   On: 07/14/2018 08:37    Procedures .Critical Care Performed by: Domenic Moras, PA-C Authorized by: Domenic Moras, PA-C   Critical care provider statement:    Critical care time (minutes):  45   Critical care was time spent personally by me on the following activities:  Discussions with consultants, evaluation of patient's response to treatment, examination of patient, ordering and performing treatments and interventions, ordering and review of laboratory studies, ordering and review of radiographic studies, pulse oximetry, re-evaluation of patient's condition, obtaining history from patient or surrogate and review of old charts   (including critical care time)  Medications Ordered in ED Medications  piperacillin-tazobactam (ZOSYN) IVPB 3.375 g (has no administration in time range)  sodium chloride 0.9 % bolus 1,000 mL (0 mLs Intravenous Stopped 07/14/18 0942)    And  sodium chloride 0.9 % bolus 1,000 mL (0 mLs Intravenous Stopped 07/14/18 0943)    And  sodium chloride 0.9 % bolus 1,000 mL (0 mLs Intravenous Stopped 07/14/18 0943)    And  sodium chloride 0.9 % bolus 500 mL (0 mLs Intravenous Stopped 07/14/18 0929)  piperacillin-tazobactam (ZOSYN) IVPB 3.375 g (0 g Intravenous Stopped 07/14/18 0949)  midazolam (VERSED) injection (1 mg Intravenous Given 07/14/18 1433)  fentaNYL (SUBLIMAZE) injection (50 mcg Intravenous Given 07/14/18 1433)  lidocaine (PF) (XYLOCAINE) 1 % injection (5 mLs Infiltration Given 07/14/18 1501)     Initial Impression / Assessment and Plan / ED Course  I have reviewed the  triage vital signs and the nursing notes.  Pertinent labs & imaging results that were available during my care of the patient were reviewed by me and considered in my medical decision making (see chart for details).     BP (!) 130/101 (BP Location: Right Arm)   Pulse 81   Temp (!) 97.5 F (36.4 C) (Rectal)   Resp 20   SpO2 98%    Final Clinical Impressions(s) / ED Diagnoses   Final diagnoses:  Disorientation  Abdominal wall seroma, subsequent encounter  Abdominal pain    ED Discharge Orders    None     7:58 AM Patient has back surgery a month ago, subsequently developed a seroma at the surgical site in his lower abdomen.  He has become increasingly tender.  Today he is altered with increasing pain and swelling at the seroma site concerning for potential sepsis secondary to infected seroma.  Work-up initiated.  Antibiotic initiated. Care discussed with Dr. Gilford Raid.   8:37 AM Pt did took his morning medications without food (he normally takes it with food).  He did received D25/D50 at the IR suite prior to his CBG being checked.  Currently CBG 251.  Pt possibly had a hypoglycemic episode causing AMS?   9:47 AM On recheck, patient felt much better, he is orienting, and no longer diaphoretic.  He is currently receiving IV fluid and resting comfortably.  Initial lactic acid is 1.46 therefore I will discontinue sepsis protocol and decreased fluid administration.  12:04 PM Labs are reassuring.  Patient at this time resting comfortably, in no acute discomfort.  He still has tenderness his abdomen but does not have any fever or signs to suggest sepsis.  I suspect his episode is likely due to hypoglycemia.  I did reach out and spoke with our interventional radiologist, Dr. Earleen Newport who will perform US guided drainage procedure.   3:18 PM Pt returned from IR suite in good spirit and tolerates procedure well.  JP drain in place.  Pt currently stable for discharge with outpt f/u.  Return  precaution given.     Domenic Moras, PA-C 07/14/18 1523    Isla Pence, MD 07/14/18 1526  ADDENDUM: CC time added.  EKG was not obtained.    Domenic Moras, PA-C 07/22/18 1224    Isla Pence, MD 08/07/18 [phone]

## 2018-07-14 NOTE — ED Notes (Signed)
[name] CBG was 251. Notified Tray, RN.

## 2018-07-14 NOTE — Progress Notes (Signed)
Upon arrival pt was in extreme abdominal pain, left lower abdomen rated 10/10. Pt and wife state that this pain has been ongoing since back surgery in July. Pt is diaphoretic and cold, afebrile. CBG 348. Pt suddenly became very lethargic. Unable to answer any questions or respond appropriately. Called rapid response for assistance. Transported pt to ED and gave report to [place], Therapist, sports. Pt vitals stable.

## 2018-07-14 NOTE — Procedures (Addendum)
Interventional Radiology Procedure Note  Procedure: US guided placement of 70F abd wall drain.    Complications: None Recommendations:  - Follow up cultures - 1 hr DC home is reasonable - Do not submerge  - Routine care - Follow up with scheduled doctors appt   Signed,  Dulcy Fanny. Earleen Newport, DO

## 2018-07-14 NOTE — ED Triage Notes (Signed)
Pt from IR; pt was scheduled from drain procedure but became diaphoretic and minimally responsive; pt has hx of DM, so IR RN administered 36ml of D50 IV; Pt received approx 664ml of NS bolus;

## 2018-07-14 NOTE — Progress Notes (Signed)
Pharmacy Antibiotic Note  Raymond Barrera is a 57 y.o. male admitted on 07/14/2018 with intra-abdominal infection.  Pharmacy has been consulted for Zosyn dosing. WBC 7.5, afebrile, CT abdomin on 8/10 showed fluid collection.  Plan: Zosyn 3.375g IV q8h (4 hour infusion).     Temp (24hrs), Avg:97.8 F (36.6 C), Min:97.8 F (36.6 C), Max:97.8 F (36.6 C)  Recent Labs  Lab 07/10/18 1456  WBC 7.5  CREATININE 1.30*    Estimated Creatinine Clearance: 86.7 mL/min (A) (by C-G formula based on SCr of 1.3 mg/dL (H)).    Allergies  Allergen Reactions  . Poison Ivy Extract [Poison Ivy Extract] Itching and Rash    Antimicrobials this admission: Zosyn 8/14>>  Thank you for allowing pharmacy to be a part of this patient's care.  Jodean Lima Juliahna Wiswell 07/14/2018 7:54 AM

## 2018-07-14 NOTE — Discharge Instructions (Signed)
Your confusion this morning may be due to low blood sugar.  Please follow up closely with your doctor for further care.  You have undergoing a drainage procedure to evacuate your abdominal seroma.  Follow up with your surgeon as well.  Return if you have any concerns.

## 2018-07-14 NOTE — Consult Note (Signed)
Chief Complaint: Patient was seen in consultation today for image guided aspiration of seroma with possible drain placement.  Referring Physician(s): Dr. Daleen Bo  Supervising Physician: Corrie Mckusick  Patient Status: [facility] - In-pt  History of Present Illness: Raymond Barrera is a 57 y.o. male with a past medical history significant for DM, HTN, HLD, MI, TIA x 7, radiculopathy and anxiety. He underwent L4-5 and L5-S1 anterior lumbar interbody fusion requiring posterior fusion on 06/17/18 with Dr. Lynann Bologna and was d/ced to home on 7/20 with follow up scheduled for 2 weeks. He reports that he noticed a mass near the incision on his abdomen that appeared to continue to grow - he was seen by Dr. Lynann Bologna on 07/01/18 who ordered a CT to address this issue however patient reports he was unable to schedule outpatient CT due to insurance difficulties.   He presented to [facility] ED on 07/10/18 with complaints of abdominal pain at incision site and continued enlargement of mass. CT abdomen/pelvis in ED revealed large anterior abdominal wall collection consistent with postoperative seroma. He was d/ced to home that night with an appointment for IR drainage and possible drain placement on 07/14/18.  Patient presented this morning for pre-operative exam, however rapid response was called at 0701 due to patient with bradycardia, hypotension, tachypnea and decreased responsiveness. He was found to be diaphoretic and would only moan to stimulation. Wife reported to rapid response RN that he began to feel nauseous with left sided pain. Unable to obtain CBG at that time however given diabetic status and last PO intake 11 pm night before he was empirically given 25 cc D50 and transferred to ED for further evaluation. CBG in ED found to be 348. Lab work and imaging essentially negative. Patient remained in ED and was given 600 mL NS bolus without any further intervention.  Patient reports he does not recall any of  the events earlier this morning - he states he remembers arriving at the hospital but that is all. He reports he feels fine now, just hungry. He denies any nausea, vomiting, diarrhea, chest pain, dyspnea or abdominal pain. He states he is ready to proceed with procedure as previously planned.   Past Medical History:  Diagnosis Date  . Anxiety    due to the stroke  . Arthritis   . Back pain    hx of buldging disc  . Diabetes mellitus without complication ([place])    takes Metformin daily  . High cholesterol    takes Zocor daily  . Hypertension    takes Benazepril and HCTZ  daily  . Joint pain   . Joint swelling   . Memory impairment    occassional - from stroke  . Myocardial infarction ([place]) 1987  . Pneumonia    hx of-80's  . PONV (postoperative nausea and vomiting)    took a while for him to wake up after previous anesthesia  . Shortness of breath dyspnea    do to pain  . Sleep apnea    never had a sleep study,but states Dr. Cindie Laroche says he has it  . Slurred speech   . Stroke ([place]) 08/2013   7 mini-strokes, last stroke 2017  . TIA (transient ischemic attack) 2014   x 7   . Urinary frequency     Past Surgical History:  Procedure Laterality Date  . ABDOMINAL EXPOSURE N/A 06/16/2018   Procedure: ABDOMINAL EXPOSURE;  Surgeon: Rosetta Posner, MD;  Location: [place];  Service: Vascular;  Laterality: N/A;  . ANKLE SURGERY  2008   left ankle-otif-[facility]  . ANTERIOR LUMBAR FUSION Bilateral 06/16/2018   Procedure: LUMBAR 4-5 LUMBAR 5-SACRUM 1 ANTERIOR LUMBAR INTERBODY FUSION WITH INSTRUMENTATION AND ALLOGRAFT;  Surgeon: Phylliss Bob, MD;  Location: [place];  Service: Orthopedics;  Laterality: Bilateral;  . BACK SURGERY    . JOINT REPLACEMENT     both hips replaced   . LUMBAR LAMINECTOMY/DECOMPRESSION MICRODISCECTOMY Right 10/17/2014   Procedure: LUMBAR LAMINECTOMY/DECOMPRESSION MICRODISCECTOMY 2 LEVELS;  Surgeon: [name], MD;  Location: [place] [facility];  Service: Neurosurgery;   Laterality: Right;  Right L45 L5S1 laminectomy and foraminotomy  . MASS EXCISION  09/13/2012   Procedure: EXCISION MASS;  Surgeon: Jamesetta So, MD;  Location: [facility];  Service: General;  Laterality: N/A;  . TONSILLECTOMY    . TOTAL HIP ARTHROPLASTY Right 03/20/2015  . TOTAL HIP ARTHROPLASTY Right 03/20/2015   Procedure: TOTAL HIP ARTHROPLASTY ANTERIOR APPROACH;  Surgeon: Renette Butters, MD;  Location: [place];  Service: Orthopedics;  Laterality: Right;  . TOTAL HIP ARTHROPLASTY Left 01/08/2016   Procedure: TOTAL HIP ARTHROPLASTY ANTERIOR APPROACH;  Surgeon: Renette Butters, MD;  Location: [place];  Service: Orthopedics;  Laterality: Left;    Allergies: Poison ivy extract [poison ivy extract]  Medications: Prior to Admission medications   Medication Sig Start Date End Date Taking? Authorizing Provider  amLODipine (NORVASC) 10 MG tablet Take 10 mg by mouth daily.   Yes [provider]  benazepril (LOTENSIN) 20 MG tablet Take 20 mg by mouth daily.   Yes [provider]  cephALEXin (KEFLEX) 500 MG capsule Take 500 mg by mouth 4 (four) times daily.   Yes [provider]  diazepam (VALIUM) 5 MG tablet Take 1 tablet (5 mg total) by mouth every 6 (six) hours as needed for muscle spasms. 06/19/18  Yes Gary Fleet, PA-C  ferrous gluconate (IRON 27) 240 (27 FE) MG tablet Take 240 mg by mouth daily.   Yes [provider]  folic acid (FOLVITE) 604 MCG tablet Take 400 mcg by mouth daily.   Yes [provider]  gabapentin (NEURONTIN) 300 MG capsule Take 300 mg by mouth 3 (three) times daily.   Yes [provider]  glucose 4 GM chewable tablet Chew 1 tablet by mouth as needed for low blood sugar.   Yes [provider]  labetalol (NORMODYNE) 200 MG tablet Take 200 mg by mouth 2 (two) times daily.   Yes [provider]  metFORMIN (GLUCOPHAGE) 500 MG tablet Take 500 mg by mouth 2 (two) times daily with a meal.   Yes [provider]  methocarbamol (ROBAXIN) 500 MG tablet Take 500 mg by mouth 4 (four) times daily as needed for muscle spasms.   Yes [provider]  Multiple Vitamin (MULTIVITAMIN WITH MINERALS) TABS tablet Take 1 tablet by mouth daily.   Yes [provider]  naproxen (NAPROSYN) 500 MG tablet Take 500 mg by mouth 2 (two) times daily as needed for mild pain.   Yes [provider]  Omega-3 Fatty Acids (FISH OIL) 1000 MG CAPS Take 1,000 mg by mouth daily.   Yes [provider]  oxyCODONE (OXY IR/ROXICODONE) 5 MG immediate release tablet Take 5 mg by mouth 4 (four) times daily.   Yes [provider]  oxyCODONE-acetaminophen (PERCOCET) 5-325 MG tablet Take 1 tablet by mouth every 4 (four) hours as needed for severe pain. 07/10/18  Yes Daleen Bo, MD  simvastatin (ZOCOR) 40 MG  tablet Take 40 mg by mouth daily.   Yes [provider]  vitamin E 200 UNIT capsule Take 200 Units by mouth daily.   Yes [provider]  aspirin 81 MG tablet Take 1 tablet (81 mg total) by mouth daily. 06/14/18   Croitoru, Dani Gobble, MD     Family History  Problem Relation Age of Onset  . Heart disease Father     Social History   Socioeconomic History  . Marital status: Married    Spouse name: Not on file  . Number of children: Not on file  . Years of education: Not on file  . Highest education level: Not on file  Occupational History  . Not on file  Social Needs  . Financial resource strain: Not on file  . Food insecurity:    Worry: Not on file    Inability: Not on file  . Transportation needs:    Medical: Not on file    Non-medical: Not on file  Tobacco Use  . Smoking status: Current Every Day Smoker    Packs/day: 0.25    Years: 47.00    Pack years: 11.75    Types: Cigarettes  . Smokeless tobacco: Never Used  Substance and Sexual Activity  . Alcohol use: No    Comment: quit 2012  . Drug use: Not Currently    Types: Cocaine    Comment: many yrs ago.,  last time- late 2016  . Sexual activity: Never  Lifestyle  . Physical activity:    Days per week: Not on file    Minutes per session: Not on file  . Stress: Not on file  Relationships  . Social connections:    Talks on phone: Not on file    Gets together: Not on file    Attends religious service: Not on file    Active member of club or organization: Not on file    Attends meetings of clubs or organizations: Not on file    Relationship status: Not on file  Other Topics Concern  . Not on file  Social History Narrative  . Not on file     Review of Systems: A 12 point ROS discussed and pertinent positives are indicated in the HPI above.  All other systems are negative.  Review of Systems  Constitutional: Negative for chills, diaphoresis and fever.  Respiratory: Negative for cough and shortness of breath.   Cardiovascular: Negative for chest pain and palpitations.  Gastrointestinal: Positive for abdominal pain (none right now - off and on pain near incision site, especially with palpation). Negative for diarrhea, nausea and vomiting.  Skin: Negative for rash.  Neurological: Negative for dizziness and syncope.    Vital Signs: BP 139/83   Pulse 78   Temp (!) 97.5 F (36.4 C) (Rectal)   Resp 19   SpO2 98%   Physical Exam  Constitutional: He is oriented to person, place, and time. No distress.  HENT:  Head: Normocephalic.  Cardiovascular: Normal rate, regular rhythm and normal heart sounds.  Pulmonary/Chest: Effort normal and breath sounds normal.  Abdominal: Soft. Bowel sounds are normal. There is tenderness (over incision).  Mass palpated LLQ over incision site  Neurological: He is alert and oriented to person, place, and time.  Skin: Skin is warm and dry. No rash noted. He is not diaphoretic. No erythema.  Psychiatric: He has a normal mood and affect. His behavior is normal. Judgment and thought content normal.  Nursing note and vitals reviewed.  MD  Evaluation Airway: WNL Heart: WNL Abdomen: WNL Chest/ Lungs: WNL ASA  Classification: 3 Mallampati/Airway Score: One   Imaging: Dg Chest 2 View  Result Date: 07/14/2018 CLINICAL DATA:  Diaphoresis and decreased mental status. Abdominal wall fluid collection, suspected abscess. EXAM: CHEST - 2 VIEW COMPARISON:  Chest radiographs 06/10/2018. FINDINGS: The heart size and mediastinal contours are stable. There is stable asymmetric elevation of the left hemidiaphragm with associated chronic left basilar atelectasis or scarring. The right lung is clear. There is no pleural effusion or pneumothorax. No acute osseous findings are evident. Telemetry leads overlie the chest. IMPRESSION: Stable chest with chronic left basilar atelectasis or scarring. No acute findings identified. Electronically Signed   By: Richardean Sale M.D.   On: 07/14/2018 08:59   Dg Lumbar Spine 2-3 Views  Result Date: 06/17/2018 CLINICAL DATA:  Posterior fusion of L4-5 and L5-S1 EXAM: LUMBAR SPINE - 2-3 VIEW COMPARISON:  MR lumbar spine of 01/14/2018 FINDINGS: Two C-arm spot films show hardware placed for posterior fusion of L4-5 and L5-S1. Interbody fusion plugs appear to be in good position with no complicating features. IMPRESSION: Posterior fusion of L4-5 and L5-S1.  No complicating features. Electronically Signed   By: Ivar Drape M.D.   On: 06/17/2018 11:52   Dg Lumbar Spine 2-3 Views  Result Date: 06/16/2018 CLINICAL DATA:  Fusion EXAM: LUMBAR SPINE - 2-3 VIEW COMPARISON:  MR 01/14/2018 FINDINGS: 2 intraoperative fluoroscopic spot images document changes of anterior instrumented interbody fusion L4-5 and L5-S1. IMPRESSION: Instrumented lumbar fusion L4-S1. Electronically Signed   By: Lucrezia Europe M.D.   On: 06/16/2018 14:55   Ct Head Wo Contrast  Result Date: 07/14/2018 CLINICAL DATA:  Altered mental status with loss of responsiveness EXAM: CT HEAD WITHOUT CONTRAST TECHNIQUE: Contiguous axial images were obtained from the  base of the skull through the vertex without intravenous contrast. COMPARISON:  Head CT February 01, 2017 and brain MRI February 02, 2017 FINDINGS: Brain: The ventricles are normal in size and configuration for age. There is no intracranial mass, hemorrhage, extra-axial fluid collection, or midline shift. There is evidence of a prior infarct involving portions of the left putamen and posterior limb left internal capsule, showing evolution since prior studies. There is patchy small vessel disease in the centra semiovale bilaterally, stable. No acute infarct is demonstrated on this study. Dystrophic appearing calcification is noted slightly to the left of the posterior falx. No edema or mass effect in this area. Vascular: No hyperdense vessels are evident. There is calcification in each distal vertebral artery and carotid siphon region. Skull: Bony calvarium appears intact. Sinuses/Orbits: There is mucosal thickening in several ethmoid air cells bilaterally. Visualized paranasal sinuses elsewhere clear. Orbits appear symmetric bilaterally. Other: Mastoid air cells are clear. IMPRESSION: Prior left basal ganglia infarct, showing evolution from prior studies. Patchy supratentorial small vessel disease appear stable. No acute infarct evident. No mass or hemorrhage. Foci of arterial vascular calcification noted. Mucosal thickening noted in several ethmoid air cells. Electronically Signed   By: Lowella Grip III M.D.   On: 07/14/2018 08:37   Ct Abdomen Pelvis W Contrast  Result Date: 07/10/2018 CLINICAL DATA:  Generalized abdominal pain with swelling beginning since a back fusions surgery in mid July. EXAM: CT ABDOMEN AND PELVIS WITH CONTRAST TECHNIQUE: Multidetector CT imaging of the abdomen and pelvis was performed using the standard protocol following bolus administration of intravenous contrast. CONTRAST:  161mL ISOVUE-300 IOPAMIDOL (ISOVUE-300) INJECTION 61% COMPARISON:  None. FINDINGS: Lower chest: No acute  abnormality. Hepatobiliary: No focal liver abnormality is seen. No gallstones, gallbladder wall thickening, or biliary dilatation. Pancreas: 8 mm cystic mass from the distal body. No other pancreatic abnormality. No inflammation. Spleen: Normal in size without focal abnormality. Adrenals/Urinary Tract: No adrenal masses. Heterogeneous enhancing mass arises from the anterior midpole the left kidney measuring 4.0 x 3.9 x 3.5 cm. Tiny low-density lesion in the midpole the right kidney most likely a cyst. No other renal masses or lesions. No stones. No hydronephrosis. Ureters are normal in course and in caliber. Bladder is unremarkable. Stomach/Bowel: Stomach is within normal limits. Appendix appears normal. No evidence of bowel wall thickening, distention, or inflammatory changes. Vascular/Lymphatic: Aortic atherosclerosis, relatively mild. No aneurysm. There is soft tissue attenuation adjacent to the left common iliac vessels that is likely scarring from the prior lumbar surgery. No discrete enlarged lymph nodes. Reproductive: Unremarkable. Prostate assessment limited by artifact from bilateral hip prostheses. Other: There is a large complex fluid collection in the left anterior abdominal wall centered on the fascia, displacing the rectus abdominus muscle posteriorly and to the right. The collection measures 14.2 x 9.1 x 7.1 cm. No ascites.  No intra-abdominal fluid collection. Musculoskeletal: Bilateral total hip arthroplasties appear well seated and aligned. Bilateral pedicle screws at L4, L5 and S1 appear well seated and well position. Bone graft material partly maintains disc height at the fused levels. Anterior screws are noted in L4 and L5, well-seated. No acute fracture. No osteoblastic or osteolytic lesions. IMPRESSION: 1. Large anterior abdominal wall fluid collection which displaces the underlying rectus abdominus muscle. This measures 14.2 x 9 x 1 x 7.1 cm. It is likely a postoperative seroma. It could be  infected if there consistent clinical symptoms. 2. Heterogeneously enhancing 4 cm left renal mass consistent with renal cell carcinoma. This is contained within Gerota's fascia. No evidence renal vein involvement. No adenopathy. 3. 9 mm cystic mass from the distal pancreatic body. Current consensus guidelines recommends follow-up CT imaging yearly for 5 years. Electronically Signed   By: Lajean Manes M.D.   On: 07/10/2018 16:36   Dg C-arm 1-60 Min  Result Date: 06/17/2018 CLINICAL DATA:  Posterior fusion of L4-5 and L5-S1 EXAM: DG C-ARM 61-120 MIN COMPARISON:  MR lumbar spine of 01/14/2018 FINDINGS: C-arm fluoroscopy was provided during posterior fusion of L4-5 and L5-S1. Fluoroscopy time of 4 minutes 23 seconds was recorded. IMPRESSION: C-arm fluoroscopy provided. Electronically Signed   By: Ivar Drape M.D.   On: 06/17/2018 11:51   Dg C-arm 1-60 Min  Result Date: 06/17/2018 CLINICAL DATA:  L4-5 L5-S1 posterior fusion EXAM: DG C-ARM 61-120 MIN COMPARISON:  MR lumbar spine of 01/14/2018 FINDINGS: C-arm fluoroscopy was provided during posterior fusion of L4-5 and L5-S1. Fluoroscopy time of 4 minutes 23 seconds was recorded. IMPRESSION: C-arm fluoroscopy provided Electronically Signed   By: Ivar Drape M.D.   On: 06/17/2018 11:51   Dg C-arm 1-60 Min  Result Date: 06/16/2018 CLINICAL DATA:  L4-S1 ALIF EXAM: DG C-ARM 61-120 MIN COMPARISON:  None. FINDINGS: Two fluoroscopic images were obtained during anterior lumbar interbody fusion at L4-S1. There is no hardware abnormality. Normal alignment. IMPRESSION: Intraoperative fluoroscopy. Electronically Signed   By: Ulyses Jarred M.D.   On: 06/16/2018 14:54   Dg Or Local Abdomen  Result Date: 06/16/2018 CLINICAL DATA:  L4-S1 ALIF. EXAM: OR LOCAL ABDOMEN COMPARISON:  Intraoperative x-rays from same day. FINDINGS: Postsurgical changes related to L4-S1 anterior and interbody fusion. Prior bilateral total hip arthroplasties. No evidence of retained radiopaque  foreign body. IMPRESSION: Interval L4-S1 ALIF. No evidence of retained radiopaque foreign body. These results were called by telephone at the time of interpretation on 06/16/2018 at 1:05 pm to the OR. Electronically Signed   By: Titus Dubin M.D.   On: 06/16/2018 13:06    Labs:  CBC: Recent Labs    06/10/18 0941 07/10/18 1456 07/14/18 1001  WBC 7.3 7.5 9.3  HGB 13.7 12.7* 11.7*  HCT 43.3 37.9* 36.8*  PLT 230 245 188    COAGS: Recent Labs    06/10/18 0941 07/14/18 1000  INR 0.97 1.08  APTT 33 30    BMP: Recent Labs    06/10/18 0941 07/10/18 1456 07/14/18 1001  NA 141 139 142  K 4.3 3.8 3.9  CL 107 105 109  CO2 26 27 25   GLUCOSE 222* 142* 137*  BUN 21* 17 17  CALCIUM 10.0 9.7 9.3  CREATININE 1.49* 1.30* 1.35*  GFRNONAA 50* 59* 56*  GFRAA 58* >60 >60    LIVER FUNCTION TESTS: Recent Labs    06/10/18 0941 07/10/18 1456 07/14/18 1001  BILITOT 0.4 0.7 0.9  AST 26 15 17   ALT 31 13 16   ALKPHOS 94 95 79  PROT 6.6 7.4 6.5  ALBUMIN 3.6 3.6 3.2*    TUMOR MARKERS: No results for input(s): AFPTM, CEA, CA199, CHROMGRNA in the last 8760 hours.  Assessment and Plan:  Postoperative seroma s/p L4-5 and L5-S1 anterior lumbar interbody fusion with Dr. Lynann Bologna on 06/17/18 - patient noted ongoing swelling at LLQ incision site approximately 10 days after surgery. He was seen by Dr. Lynann Bologna on 8/1 for same and a CT was ordered however patient was unable to schedule outpatient CT due to insurance issues.   He presented to [facility] ED on 8/10 with complaints with abdominal pain and concern for ongoing swelling - CT in ED showed large anterior abdominal fluid collection which was most likely a postoperative seroma. He was scheduled for IR aspiration and drainage of abdominal seroma as an outpatient today.   When he arrived to pre-op he was found to be diaphoretic with decreased level of consciousness, bradycardic, hypotensive and tachypneic. He was given small bolus of D50  due to previous history of DM and inability to obtain CBG at that time and then transferred to ED for further evaluation. Work up in ED revealed no obvious acute issues and patient is now feeling much better, wishes to proceed with procedure today.   Risks and benefits discussed with the patient, wife and mother including, but not limited to bleeding, infection, damage to adjacent structures or low yield requiring additional tests.  All of the patient's, wife's and mother's questions were answered, patient is agreeable to proceed.  Consent signed and in chart.  Thank you for this interesting consult.  I greatly enjoyed meeting SILVIO SAUSEDO and look forward to participating in their care.  A copy of this report was sent to the requesting provider on this date.  Electronically Signed: Joaquim Nam, PA-C 07/14/2018, 1:53 PM   I spent a total of 40 Minutes in face to face in clinical consultation, greater than 50% of which was counseling/coordinating care for aspiration and drainage of postoperative abdominal seroma.

## 2018-07-14 NOTE — Significant Event (Addendum)
Rapid Response Event Note  Overview:  Called to assist IR with outpatient for IR procedure  Time Called: 0701 Arrival Time: 0703 Event Type: Neurologic, Hypotension  Initial Focused Assessment:  Cold and clammy - moans to stimulation - HR 54 SR - BP 108/53 RR 26 O2 sats 97% on RA.  Abigail Butts RN reports patient here for outpatient drain placement for abcess.  Jannifer Franklin PA present in room.  Wife reports patient took all his antihypertensive meds this am - but no hypogylcemics - she reports he started feeling bad on the way to hospital with nausea and continued pain left side.  Difficulty getting CBG.    Interventions:  Difficulty getting CBG - #20 angiocath inserted right mid forearm -  empiracally treated with D50 25 cc.  NS wide open started.  Becoming more arousable but remains very cold and diaphoretic- CBG now 358.  Transferred to ED for treatment.  Second IV started - #20 angiocath left hand - labs sent per ED.  Domenic Moras PA present - orders noted.  Patient now with spontaneous eye opening - answers some questions - complain of pain left abdomen - very tender to touch and some nausea. Repeat CBG 251.  BP 112/60 HR 58 RR 24 O2 sats 97%.    Wife and mother in room.  Dr. Earleen Newport with IR aware - speaking with ED staff.  Handoff to [employer].     Plan of Care (if not transferred):  Event Summary: Name of Physician Notified: Jannifer Franklin PA Radiology at (PTA RRT)  Name of Consulting Physician Notified: Domenic Moras PA Emergency Dept at [phone]  Outcome: Transferred (Comment)(To ED - from IR outpatient)  Event End Time: 0800  Quin Hoop

## 2018-07-15 ENCOUNTER — Ambulatory Visit (HOSPITAL_COMMUNITY): Payer: Medicaid Other

## 2018-07-15 ENCOUNTER — Other Ambulatory Visit: Payer: Self-pay | Admitting: Interventional Radiology

## 2018-07-15 DIAGNOSIS — L02211 Cutaneous abscess of abdominal wall: Secondary | ICD-10-CM

## 2018-07-19 LAB — CULTURE, BLOOD (ROUTINE X 2)
CULTURE: NO GROWTH
Culture: NO GROWTH
Special Requests: ADEQUATE

## 2018-07-19 LAB — AEROBIC/ANAEROBIC CULTURE W GRAM STAIN (SURGICAL/DEEP WOUND)
Culture: NO GROWTH
Gram Stain: NONE SEEN

## 2018-07-19 LAB — AEROBIC/ANAEROBIC CULTURE (SURGICAL/DEEP WOUND)

## 2018-07-20 ENCOUNTER — Other Ambulatory Visit: Payer: Self-pay | Admitting: Radiology

## 2018-07-20 ENCOUNTER — Telehealth: Payer: Self-pay | Admitting: Radiology

## 2018-07-20 MED ORDER — SODIUM CHLORIDE 0.9 % IJ SOLN: 10.0000 mL | INTRAMUSCULAR | 0 refills | Status: DC

## 2018-07-20 NOTE — Telephone Encounter (Signed)
Patient's wife called requesting instructions for care of abscess drain.  Rx phoned in to [place].  Follow up app't 07/27/2018.  Herman Mell Riki Rusk, RN 07/20/2018 11:49 AM

## 2018-07-27 ENCOUNTER — Ambulatory Visit (HOSPITAL_COMMUNITY)
Admission: RE | Admit: 2018-07-27 | Discharge: 2018-07-27 | Disposition: A | Discharge status: Home / Self Care | Payer: Medicaid Other | Source: Ambulatory Visit | Attending: Interventional Radiology | Admitting: Interventional Radiology

## 2018-07-27 ENCOUNTER — Ambulatory Visit (INDEPENDENT_AMBULATORY_CARE_PROVIDER_SITE_OTHER): Payer: Self-pay | Admitting: Vascular Surgery

## 2018-07-27 ENCOUNTER — Ambulatory Visit
Admission: RE | Admit: 2018-07-27 | Discharge: 2018-07-27 | Disposition: A | Discharge status: Home / Self Care | Payer: Medicaid Other | Source: Ambulatory Visit | Attending: Interventional Radiology | Admitting: Interventional Radiology

## 2018-07-27 ENCOUNTER — Other Ambulatory Visit: Payer: Medicaid Other

## 2018-07-27 ENCOUNTER — Other Ambulatory Visit: Payer: Self-pay

## 2018-07-27 VITALS — BP 114/81 | HR 82 | Temp 98.1°F | Resp 20 | Ht 78.0 in | Wt 252.0 lb

## 2018-07-27 DIAGNOSIS — S301XXA Contusion of abdominal wall, initial encounter: Secondary | ICD-10-CM | POA: Diagnosis not present

## 2018-07-27 DIAGNOSIS — I700 Atherosclerosis of aorta: Secondary | ICD-10-CM | POA: Insufficient documentation

## 2018-07-27 DIAGNOSIS — K862 Cyst of pancreas: Secondary | ICD-10-CM | POA: Diagnosis not present

## 2018-07-27 DIAGNOSIS — Z9689 Presence of other specified functional implants: Secondary | ICD-10-CM | POA: Insufficient documentation

## 2018-07-27 DIAGNOSIS — M5137 Other intervertebral disc degeneration, lumbosacral region: Secondary | ICD-10-CM

## 2018-07-27 DIAGNOSIS — L02211 Cutaneous abscess of abdominal wall: Secondary | ICD-10-CM

## 2018-07-27 HISTORY — PX: IR RADIOLOGIST EVAL & MGMT: IMG5224

## 2018-07-27 NOTE — Progress Notes (Signed)
Patient name: CAEDON BOND MRN: [id number] DOB: October 26, 1961 Sex: male  REASON FOR VISIT: Evaluation of postop left lower quadrant hematoma  HPI: LYLE NIBLETT is a 57 y.o. male known to me from prior anterior exposure for two-level fusion, L4-5 and L5-S1.  On 06/16/2018.  He is here today with his wife.  He reports that after he was discharged from home following approximately 2 days he noted swelling in his left lower quadrant.  He had a further evaluation to include CT scan which showed a hematoma in the anterior abdominal wall above the level of the rectus muscle.  He had initial treatment with interventional radiology with a drain placement on 07/14/2018.  According to the notes 70 cc of fluid was removed and cultures of this were negative for aerobic and anaerobic growth.  He had his drain removed today and repeat CT scan.  I have reviewed these films as well.  This does show slightly diminished size after having the pigtail catheter in this collection.  The patient and wife reports significant pain associated with this.  Is any fevers. Current Outpatient Medications  Medication Sig Dispense Refill  . amLODipine (NORVASC) 10 MG tablet Take 10 mg by mouth daily.    Marland Kitchen aspirin 81 MG tablet Take 1 tablet (81 mg total) by mouth daily.    . benazepril (LOTENSIN) 20 MG tablet Take 20 mg by mouth daily.    . cephALEXin (KEFLEX) 500 MG capsule Take 500 mg by mouth 4 (four) times daily.    . diazepam (VALIUM) 5 MG tablet Take 1 tablet (5 mg total) by mouth every 6 (six) hours as needed for muscle spasms. 30 tablet 0  . ferrous gluconate (IRON 27) 240 (27 FE) MG tablet Take 240 mg by mouth daily.    . folic acid (FOLVITE) 818 MCG tablet Take 400 mcg by mouth daily.    Marland Kitchen gabapentin (NEURONTIN) 300 MG capsule Take 300 mg by mouth 3 (three) times daily.    Marland Kitchen glucose 4 GM chewable tablet Chew 1 tablet by mouth as needed for low blood sugar.    . labetalol (NORMODYNE) 200 MG  tablet Take 200 mg by mouth 2 (two) times daily.    . metFORMIN (GLUCOPHAGE) 500 MG tablet Take 500 mg by mouth 2 (two) times daily with a meal.    . methocarbamol (ROBAXIN) 500 MG tablet Take 500 mg by mouth 4 (four) times daily as needed for muscle spasms.    . Multiple Vitamin (MULTIVITAMIN WITH MINERALS) TABS tablet Take 1 tablet by mouth daily.    . naproxen (NAPROSYN) 500 MG tablet Take 500 mg by mouth 2 (two) times daily as needed for mild pain.    . Omega-3 Fatty Acids (FISH OIL) 1000 MG CAPS Take 1,000 mg by mouth daily.    . simvastatin (ZOCOR) 40 MG tablet Take 40 mg by mouth daily.    . vitamin E 200 UNIT capsule Take 200 Units by mouth daily.    Marland Kitchen oxyCODONE (OXY IR/ROXICODONE) 5 MG immediate release tablet Take 5 mg by mouth 4 (four) times daily.     No current facility-administered medications for this visit.      PHYSICAL EXAM: Vitals:   07/27/18 1506  BP: 114/81  Pulse: 82  Resp: 20  Temp: 98.1 F (36.7 C)  TempSrc: Oral  SpO2: 97%  Weight: 252 lb (114.3 kg)  Height: 6\' 6"  (1.981 m)    GENERAL: The patient is a well-nourished male, in  no acute distress. The vital signs are documented above. Incision itself is well-healed.  There is some fullness and tenderness over the area of the hematoma.  He has a dressing in place from removal of the pigtail drain earlier by interventional radiology  MEDICAL ISSUES: Postop hematoma following 2 level spine exposure anterior to the rectus muscle in the subcutaneous fat.  He did not have much output from the pigtail drain.  This was solid hematoma size would not expect much improvement with the pigtail catheter.  I did explain that this should be absorbed over time.  I would not recommend exploration currently since initial attempt has been conservative.  Would recommend continued observation.  He and his wife are both requesting additional and more potent narcotics for pain control.  He does have a history of difficulty with pain  management.  I explained to the patient and his wife that I will see Dr. Lynann Bologna tomorrow morning and will develop a plan for pain management and relay this to him.  He reports that the Percocet that he is using is not effective and is requesting oxycodone.  I will see him again in 1 month for continued follow-up.  He knows to notify should he develop any fevers erythema or drainage in the interim.   Rosetta Posner, MD FACS [facility] Tel [phone] Pager [phone]

## 2018-07-27 NOTE — H&P (View-Only) (Signed)
Patient name: Raymond Barrera MRN: [id number] DOB: 1961/07/09 Sex: male  REASON FOR VISIT: Evaluation of postop left lower quadrant hematoma  HPI: Raymond Barrera is a 57 y.o. male known to me from prior anterior exposure for two-level fusion, L4-5 and L5-S1.  On 06/16/2018.  He is here today with his wife.  He reports that after he was discharged from home following approximately 2 days he noted swelling in his left lower quadrant.  He had a further evaluation to include CT scan which showed a hematoma in the anterior abdominal wall above the level of the rectus muscle.  He had initial treatment with interventional radiology with a drain placement on 07/14/2018.  According to the notes 70 cc of fluid was removed and cultures of this were negative for aerobic and anaerobic growth.  He had his drain removed today and repeat CT scan.  I have reviewed these films as well.  This does show slightly diminished size after having the pigtail catheter in this collection.  The patient and wife reports significant pain associated with this.  Is any fevers. Current Outpatient Medications  Medication Sig Dispense Refill  . amLODipine (NORVASC) 10 MG tablet Take 10 mg by mouth daily.    Marland Kitchen aspirin 81 MG tablet Take 1 tablet (81 mg total) by mouth daily.    . benazepril (LOTENSIN) 20 MG tablet Take 20 mg by mouth daily.    . cephALEXin (KEFLEX) 500 MG capsule Take 500 mg by mouth 4 (four) times daily.    . diazepam (VALIUM) 5 MG tablet Take 1 tablet (5 mg total) by mouth every 6 (six) hours as needed for muscle spasms. 30 tablet 0  . ferrous gluconate (IRON 27) 240 (27 FE) MG tablet Take 240 mg by mouth daily.    . folic acid (FOLVITE) 300 MCG tablet Take 400 mcg by mouth daily.    Marland Kitchen gabapentin (NEURONTIN) 300 MG capsule Take 300 mg by mouth 3 (three) times daily.    Marland Kitchen glucose 4 GM chewable tablet Chew 1 tablet by mouth as needed for low blood sugar.    . labetalol (NORMODYNE) 200 MG  tablet Take 200 mg by mouth 2 (two) times daily.    . metFORMIN (GLUCOPHAGE) 500 MG tablet Take 500 mg by mouth 2 (two) times daily with a meal.    . methocarbamol (ROBAXIN) 500 MG tablet Take 500 mg by mouth 4 (four) times daily as needed for muscle spasms.    . Multiple Vitamin (MULTIVITAMIN WITH MINERALS) TABS tablet Take 1 tablet by mouth daily.    . naproxen (NAPROSYN) 500 MG tablet Take 500 mg by mouth 2 (two) times daily as needed for mild pain.    . Omega-3 Fatty Acids (FISH OIL) 1000 MG CAPS Take 1,000 mg by mouth daily.    . simvastatin (ZOCOR) 40 MG tablet Take 40 mg by mouth daily.    . vitamin E 200 UNIT capsule Take 200 Units by mouth daily.    Marland Kitchen oxyCODONE (OXY IR/ROXICODONE) 5 MG immediate release tablet Take 5 mg by mouth 4 (four) times daily.     No current facility-administered medications for this visit.      PHYSICAL EXAM: Vitals:   07/27/18 1506  BP: 114/81  Pulse: 82  Resp: 20  Temp: 98.1 F (36.7 C)  TempSrc: Oral  SpO2: 97%  Weight: 252 lb (114.3 kg)  Height: 6\' 6"  (1.981 m)    GENERAL: The patient is a well-nourished male, in  no acute distress. The vital signs are documented above. Incision itself is well-healed.  There is some fullness and tenderness over the area of the hematoma.  He has a dressing in place from removal of the pigtail drain earlier by interventional radiology  MEDICAL ISSUES: Postop hematoma following 2 level spine exposure anterior to the rectus muscle in the subcutaneous fat.  He did not have much output from the pigtail drain.  This was solid hematoma size would not expect much improvement with the pigtail catheter.  I did explain that this should be absorbed over time.  I would not recommend exploration currently since initial attempt has been conservative.  Would recommend continued observation.  He and his wife are both requesting additional and more potent narcotics for pain control.  He does have a history of difficulty with pain  management.  I explained to the patient and his wife that I will see Dr. Lynann Bologna tomorrow morning and will develop a plan for pain management and relay this to him.  He reports that the Percocet that he is using is not effective and is requesting oxycodone.  I will see him again in 1 month for continued follow-up.  He knows to notify should he develop any fevers erythema or drainage in the interim.   Rosetta Posner, MD FACS [facility] Tel [phone] Pager [phone]

## 2018-07-27 NOTE — Progress Notes (Signed)
Referring Physician(s): Dumonski,M  Chief Complaint: The patient is seen in follow up today s/p drainage of an abdominal wall hematoma on 07/14/2018.  History of present illness: Raymond Barrera is a 57 year old male, status post L4-5/L5-S1 anterior lumbar interbody fusion on 06/17/2018.  Postop he developed an abdominal wall hematoma and underwent image guided drainage on 07/14/2018.  Drain fluid cultures were negative.  He presents today for follow-up CT and drain evaluation.  Patient states since his surgery he has had diminished appetite, insomnia, persistent abdominal and back discomfort, occasional nausea, difficulty walking, currently using cane and walker for assistance.  He also has occasional headaches, some dyspnea with exertion and intermittent coughing.  He is trying to quit smoking.  Drain output has averaged about 25 to 50 cc every 2 days of dark bloody fluid.   Past Medical History:  Diagnosis Date  . Anxiety    due to the stroke  . Arthritis   . Back pain    hx of buldging disc  . Diabetes mellitus without complication ([place])    takes Metformin daily  . High cholesterol    takes Zocor daily  . Hypertension    takes Benazepril and HCTZ  daily  . Joint pain   . Joint swelling   . Memory impairment    occassional - from stroke  . Myocardial infarction ([place]) 1987  . Pneumonia    hx of-80's  . PONV (postoperative nausea and vomiting)    took a while for him to wake up after previous anesthesia  . Shortness of breath dyspnea    do to pain  . Sleep apnea    never had a sleep study,but states Dr. Cindie Laroche says he has it  . Slurred speech   . Stroke ([place]) 08/2013   7 mini-strokes, last stroke 2017  . TIA (transient ischemic attack) 2014   x 7   . Urinary frequency     Past Surgical History:  Procedure Laterality Date  . ABDOMINAL EXPOSURE N/A 06/16/2018   Procedure: ABDOMINAL EXPOSURE;  Surgeon: Rosetta Posner, MD;  Location: [place];  Service: Vascular;  Laterality: N/A;    . ANKLE SURGERY  2008   left ankle-otif-[facility]  . ANTERIOR LUMBAR FUSION Bilateral 06/16/2018   Procedure: LUMBAR 4-5 LUMBAR 5-SACRUM 1 ANTERIOR LUMBAR INTERBODY FUSION WITH INSTRUMENTATION AND ALLOGRAFT;  Surgeon: Phylliss Bob, MD;  Location: [place];  Service: Orthopedics;  Laterality: Bilateral;  . BACK SURGERY    . IR US GUIDE BX ASP/DRAIN  07/14/2018  . JOINT REPLACEMENT     both hips replaced   . LUMBAR LAMINECTOMY/DECOMPRESSION MICRODISCECTOMY Right 10/17/2014   Procedure: LUMBAR LAMINECTOMY/DECOMPRESSION MICRODISCECTOMY 2 LEVELS;  Surgeon: [name], MD;  Location: [place] [facility];  Service: Neurosurgery;  Laterality: Right;  Right L45 L5S1 laminectomy and foraminotomy  . MASS EXCISION  09/13/2012   Procedure: EXCISION MASS;  Surgeon: Jamesetta So, MD;  Location: [facility];  Service: General;  Laterality: N/A;  . TONSILLECTOMY    . TOTAL HIP ARTHROPLASTY Right 03/20/2015  . TOTAL HIP ARTHROPLASTY Right 03/20/2015   Procedure: TOTAL HIP ARTHROPLASTY ANTERIOR APPROACH;  Surgeon: Renette Butters, MD;  Location: [place];  Service: Orthopedics;  Laterality: Right;  . TOTAL HIP ARTHROPLASTY Left 01/08/2016   Procedure: TOTAL HIP ARTHROPLASTY ANTERIOR APPROACH;  Surgeon: Renette Butters, MD;  Location: [place];  Service: Orthopedics;  Laterality: Left;    Allergies: Poison ivy extract [poison ivy extract]  Medications: Prior to Admission medications  Medication Sig Start Date End Date Taking? Authorizing Provider  amLODipine (NORVASC) 10 MG tablet Take 10 mg by mouth daily.    [provider]  aspirin 81 MG tablet Take 1 tablet (81 mg total) by mouth daily. 06/14/18   Croitoru, Mihai, MD  benazepril (LOTENSIN) 20 MG tablet Take 20 mg by mouth daily.    [provider]  cephALEXin (KEFLEX) 500 MG capsule Take 500 mg by mouth 4 (four) times daily.    [provider]  diazepam (VALIUM) 5 MG tablet Take 1 tablet (5 mg total) by mouth every 6 (six) hours as needed  for muscle spasms. 06/19/18   Gary Fleet, PA-C  ferrous gluconate (IRON 27) 240 (27 FE) MG tablet Take 240 mg by mouth daily.    [provider]  folic acid (FOLVITE) 161 MCG tablet Take 400 mcg by mouth daily.    [provider]  gabapentin (NEURONTIN) 300 MG capsule Take 300 mg by mouth 3 (three) times daily.    [provider]  glucose 4 GM chewable tablet Chew 1 tablet by mouth as needed for low blood sugar.    [provider]  labetalol (NORMODYNE) 200 MG tablet Take 200 mg by mouth 2 (two) times daily.    [provider]  metFORMIN (GLUCOPHAGE) 500 MG tablet Take 500 mg by mouth 2 (two) times daily with a meal.    [provider]  methocarbamol (ROBAXIN) 500 MG tablet Take 500 mg by mouth 4 (four) times daily as needed for muscle spasms.    [provider]  Multiple Vitamin (MULTIVITAMIN WITH MINERALS) TABS tablet Take 1 tablet by mouth daily.    [provider]  naproxen (NAPROSYN) 500 MG tablet Take 500 mg by mouth 2 (two) times daily as needed for mild pain.    [provider]  Omega-3 Fatty Acids (FISH OIL) 1000 MG CAPS Take 1,000 mg by mouth daily.    [provider]  oxyCODONE (OXY IR/ROXICODONE) 5 MG immediate release tablet Take 5 mg by mouth 4 (four) times daily.    [provider]  oxyCODONE-acetaminophen (PERCOCET) 5-325 MG tablet Take 1 tablet by mouth every 4 (four) hours as needed for severe pain. 07/10/18   Daleen Bo, MD  simvastatin (ZOCOR) 40 MG tablet Take 40 mg by mouth daily.    [provider]  sodium chloride 0.9 % injection 10 mLs by Other route as directed for 8 days. Use as directed to flush abscess drain. 07/20/18 07/28/18  Markus Daft, MD  vitamin E 200 UNIT capsule Take 200 Units by mouth daily.    [provider]     Family History  Problem Relation Age of Onset  . Heart disease Father     Social History   Socioeconomic History  .  Marital status: Married    Spouse name: Not on file  . Number of children: Not on file  . Years of education: Not on file  . Highest education level: Not on file  Occupational History  . Not on file  Social Needs  . Financial resource strain: Not on file  . Food insecurity:    Worry: Not on file    Inability: Not on file  . Transportation needs:    Medical: Not on file    Non-medical: Not on file  Tobacco Use  . Smoking status: Current Every Day Smoker    Packs/day: 0.25    Years: 47.00    Pack years:  11.75    Types: Cigarettes  . Smokeless tobacco: Never Used  Substance and Sexual Activity  . Alcohol use: No    Comment: quit 2012  . Drug use: Not Currently    Types: Cocaine    Comment: many yrs ago., last time- late 2016  . Sexual activity: Never  Lifestyle  . Physical activity:    Days per week: Not on file    Minutes per session: Not on file  . Stress: Not on file  Relationships  . Social connections:    Talks on phone: Not on file    Gets together: Not on file    Attends religious service: Not on file    Active member of club or organization: Not on file    Attends meetings of clubs or organizations: Not on file    Relationship status: Not on file  Other Topics Concern  . Not on file  Social History Narrative  . Not on file     Vital Signs: Vitals:   07/27/18 1324  BP: 111/86  Pulse: 89  Resp: 20  Temp: (!) 97.1 F (36.2 C)  SpO2: 98%     Physical Exam awake, alert.  Chest clear to auscultation bilaterally.  Heart with regular rate and rhythm.  Abdomen soft, positive bowel sounds, left lower abdominal drain intact, insertion site okay, mildly tender, palpable but smaller  hematoma noted.  Small amount of dark bloody fluid in JP bulb.  Imaging: Ct Abdomen Pelvis Wo Contrast  Result Date: 07/27/2018 CLINICAL DATA:  57 year old male with a history of anterior approach for lumbar spine fusion and abdominal wall hematoma. EXAM: CT ABDOMEN AND PELVIS  WITHOUT CONTRAST TECHNIQUE: Multidetector CT imaging of the abdomen and pelvis was performed following the standard protocol without IV contrast. COMPARISON:  07/10/2018, ultrasound drainage 07/14/2018 FINDINGS: Lower chest: Atelectatic changes at the base of the left lung. Hepatobiliary: Unremarkable appearance liver Pancreas: Cyst of the pancreatic body was better characterized on the prior CT, though is unchanged in size. Spleen: Unremarkable spleen Adrenals/Urinary Tract: Unremarkable appearance of the adrenal glands. No evidence of hydronephrosis of the right or left kidney. No nephrolithiasis. Unremarkable course of the bilateral ureters. Unremarkable appearance of the urinary bladder. Redemonstration of a left renal mass, which is incompletely characterized on the current CT giving the absence of contrast. Stomach/Bowel: Stomach is within normal limits. Appendix appears normal. No evidence of bowel wall thickening, distention, or inflammatory changes. Vascular/Lymphatic: Minimal atherosclerotic changes of the abdominal aorta and the iliac arteries. No lymphadenopathy Reproductive: Unremarkable appearance of the pelvic structures. Other: Hematoma of the left abdominal wall measures slightly smaller than the comparison CT, with prior dimension 7.3 cm by 13 cm. Dimensions today 4.4 cm x 11 cm. Pigtail drainage catheter remains centered within the fluid collection. Musculoskeletal: Surgical changes of the lumbar region again demonstrated with posterior lumbar interbody fusion with bilateral pedicle screw and rod fixation spanning L4-S1, as well as changes of anterior approach for discectomy/interbody spacer placement and fixation at L4-L5 and L5-S1. IMPRESSION: Pigtail drainage catheter remains centered within residual hematoma of the left anterior abdominal wall. The fluid collection is decreased in size since the prior CT. Redemonstration of left renal tumor, which is better characterized on the prior CT,  performed with IV contrast. Aortic Atherosclerosis (ICD10-I70.0). 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 As was previously mention, a 1 year follow-up MRI or CT recommended. Electronically Signed   By: Corrie Mckusick D.O.   On: 07/27/2018 12:35  Labs:  CBC: Recent Labs    06/10/18 0941 07/10/18 1456 07/14/18 1001  WBC 7.3 7.5 9.3  HGB 13.7 12.7* 11.7*  HCT 43.3 37.9* 36.8*  PLT 230 245 188    COAGS: Recent Labs    06/10/18 0941 07/14/18 1000  INR 0.97 1.08  APTT 33 30    BMP: Recent Labs    06/10/18 0941 07/10/18 1456 07/14/18 1001  NA 141 139 142  K 4.3 3.8 3.9  CL 107 105 109  CO2 26 27 25   GLUCOSE 222* 142* 137*  BUN 21* 17 17  CALCIUM 10.0 9.7 9.3  CREATININE 1.49* 1.30* 1.35*  GFRNONAA 50* 59* 56*  GFRAA 58* >60 >60    LIVER FUNCTION TESTS: Recent Labs    06/10/18 0941 07/10/18 1456 07/14/18 1001  BILITOT 0.4 0.7 0.9  AST 26 15 17   ALT 31 13 16   ALKPHOS 94 95 79  PROT 6.6 7.4 6.5  ALBUMIN 3.6 3.6 3.2*    Assessment: 57 year old male, status post L4-5/L5-S1 anterior lumbar interbody fusion on 06/17/2018.  Postop he developed an abdominal wall hematoma and underwent image guided drainage on 07/14/2018.  Drain fluid cultures were negative.  Drain output has been about 25 to 50 cc every 2 days.  Follow-up CT today reveals decrease in size of abdominal wall hematoma with very little fluid component noted.  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  Left abdominal drain was removed today in its entirety without immediate complications.  Gauze dressing applied over site.  Site care discussed with patient and wife.  Patient scheduled to follow-up with Dr. Lovena Neighbours regarding renal mass.  He is scheduled to follow-up with Dr. Donnetta Hutching today and Dr. Lynann Bologna on 8/30.   Signed: D. Rowe Robert, PA-C 07/27/2018, 1:15 PM   Please refer to Dr. Katrinka Blazing attestation of this  note for management and plan.      Patient ID: Raymond Barrera, male   DOB: [birth year], 59 y.o.   MRN: [id number]

## 2018-08-11 ENCOUNTER — Encounter (HOSPITAL_COMMUNITY): Payer: Self-pay

## 2018-08-11 ENCOUNTER — Other Ambulatory Visit: Payer: Self-pay

## 2018-08-11 ENCOUNTER — Emergency Department (HOSPITAL_COMMUNITY)
Admission: EM | Admit: 2018-08-11 | Discharge: 2018-08-11 | Disposition: A | Discharge status: Home / Self Care | Payer: Medicaid Other | Attending: Emergency Medicine | Admitting: Emergency Medicine

## 2018-08-11 ENCOUNTER — Emergency Department (HOSPITAL_COMMUNITY): Payer: Medicaid Other

## 2018-08-11 ENCOUNTER — Telehealth: Payer: Self-pay | Admitting: *Deleted

## 2018-08-11 DIAGNOSIS — I10 Essential (primary) hypertension: Secondary | ICD-10-CM | POA: Diagnosis not present

## 2018-08-11 DIAGNOSIS — E119 Type 2 diabetes mellitus without complications: Secondary | ICD-10-CM | POA: Insufficient documentation

## 2018-08-11 DIAGNOSIS — R140 Abdominal distension (gaseous): Secondary | ICD-10-CM | POA: Diagnosis not present

## 2018-08-11 DIAGNOSIS — I252 Old myocardial infarction: Secondary | ICD-10-CM | POA: Insufficient documentation

## 2018-08-11 DIAGNOSIS — Z7984 Long term (current) use of oral hypoglycemic drugs: Secondary | ICD-10-CM | POA: Diagnosis not present

## 2018-08-11 DIAGNOSIS — R109 Unspecified abdominal pain: Secondary | ICD-10-CM | POA: Diagnosis present

## 2018-08-11 DIAGNOSIS — Z7982 Long term (current) use of aspirin: Secondary | ICD-10-CM | POA: Insufficient documentation

## 2018-08-11 DIAGNOSIS — Z79899 Other long term (current) drug therapy: Secondary | ICD-10-CM | POA: Insufficient documentation

## 2018-08-11 DIAGNOSIS — C642 Malignant neoplasm of left kidney, except renal pelvis: Secondary | ICD-10-CM

## 2018-08-11 DIAGNOSIS — F17210 Nicotine dependence, cigarettes, uncomplicated: Secondary | ICD-10-CM | POA: Diagnosis not present

## 2018-08-11 DIAGNOSIS — S301XXD Contusion of abdominal wall, subsequent encounter: Secondary | ICD-10-CM

## 2018-08-11 DIAGNOSIS — L7634 Postprocedural seroma of skin and subcutaneous tissue following other procedure: Secondary | ICD-10-CM | POA: Diagnosis not present

## 2018-08-11 LAB — COMPREHENSIVE METABOLIC PANEL WITH GFR
ALT: 20 U/L (ref 0–44)
AST: 20 U/L (ref 15–41)
Albumin: 4 g/dL (ref 3.5–5.0)
Alkaline Phosphatase: 90 U/L (ref 38–126)
Anion gap: 8 (ref 5–15)
BUN: 20 mg/dL (ref 6–20)
CO2: 27 mmol/L (ref 22–32)
Calcium: 10.2 mg/dL (ref 8.9–10.3)
Chloride: 108 mmol/L (ref 98–111)
Creatinine, Ser: 1.43 mg/dL — ABNORMAL HIGH (ref 0.61–1.24)
GFR calc Af Amer: >60 mL/min
GFR calc non Af Amer: 52 mL/min — ABNORMAL LOW
Glucose, Bld: 86 mg/dL (ref 70–99)
Potassium: 4.3 mmol/L (ref 3.5–5.1)
Sodium: 143 mmol/L (ref 135–145)
Total Bilirubin: 0.6 mg/dL (ref 0.3–1.2)
Total Protein: 8 g/dL (ref 6.5–8.1)

## 2018-08-11 LAB — URINALYSIS, ROUTINE W REFLEX MICROSCOPIC
Bacteria, UA: NONE SEEN
Glucose, UA: NEGATIVE mg/dL
Hgb urine dipstick: NEGATIVE
Ketones, ur: 5 mg/dL — AB
Leukocytes, UA: NEGATIVE
Nitrite: NEGATIVE
Protein, ur: 100 mg/dL — AB
Specific Gravity, Urine: 1.03 (ref 1.005–1.030)
pH: 5 (ref 5.0–8.0)

## 2018-08-11 LAB — CBC
HCT: 40.4 % (ref 39.0–52.0)
Hemoglobin: 13.6 g/dL (ref 13.0–17.0)
MCH: 29.9 pg (ref 26.0–34.0)
MCHC: 33.7 g/dL (ref 30.0–36.0)
MCV: 88.8 fL (ref 78.0–100.0)
Platelets: 265 10*3/uL (ref 150–400)
RBC: 4.55 MIL/uL (ref 4.22–5.81)
RDW: 13.9 % (ref 11.5–15.5)
WBC: 6.1 10*3/uL (ref 4.0–10.5)

## 2018-08-11 LAB — LIPASE, BLOOD: Lipase: 36 U/L (ref 11–51)

## 2018-08-11 MED ORDER — IOPAMIDOL (ISOVUE-300) INJECTION 61%
INTRAVENOUS | Status: AC
  Filled 2018-08-11: qty 100

## 2018-08-11 MED ORDER — IOPAMIDOL (ISOVUE-300) INJECTION 61%
100.0000 mL | Freq: Once | INTRAVENOUS | Status: AC | PRN
  Administered 2018-08-11: 100 mL via INTRAVENOUS

## 2018-08-11 MED ORDER — ONDANSETRON 4 MG PO TBDP: 4.0000 mg | ORAL_TABLET | Freq: Once | ORAL | Status: DC | PRN

## 2018-08-11 NOTE — Discharge Instructions (Addendum)
Dr. Luther Parody office will call you tomorrow to make arrangements for a surgical procedure on Friday to manage the swollen area of your left lower abdomen.  Try using heat on the sore area 2 or 3 times a day to help the discomfort.

## 2018-08-11 NOTE — ED Triage Notes (Signed)
Pt BIBA from home. Pt had back surgery, hwere they went through his abdomen Pt has had pocket of fluid in abdomen after.  Pt had drainage tube removed 8/27. Pt has fluid building up in abdomen again.

## 2018-08-11 NOTE — ED Notes (Signed)
Patient ambulated to CT

## 2018-08-11 NOTE — ED Provider Notes (Signed)
[facility] DEPT Provider Note   CSN: [id number] Arrival date & time: 08/11/18  1725     History   Chief Complaint Chief Complaint  Patient presents with  . Abdominal Pain  . Bloated    HPI Raymond Barrera is a 57 y.o. male.  HPI   He is here for recurrent swelling in the abdomen associated with a seroma, which was drained about a month ago, with catheter placement, it was removed on 07/27/2018.  Since that time he has had gradually worse pain and swelling in the same area.  This is been associated with anorexia, weight loss and malaise.  Additionally he is having trouble walking because of back pain.  He saw his back surgeon, 07/30/2018 and was told things were doing okay and that he should follow-up in 6 weeks for checkup.  He denies fever, chills, nausea, vomiting, focal weakness or paresthesia.  He is taking his usual medications, without relief.  Past Medical History:  Diagnosis Date  . Anxiety    due to the stroke  . Arthritis   . Back pain    hx of buldging disc  . Diabetes mellitus without complication ([place])    takes Metformin daily  . High cholesterol    takes Zocor daily  . Hypertension    takes Benazepril and HCTZ  daily  . Joint pain   . Joint swelling   . Memory impairment    occassional - from stroke  . Myocardial infarction ([place]) 1987  . Pneumonia    hx of-80's  . PONV (postoperative nausea and vomiting)    took a while for him to wake up after previous anesthesia  . Shortness of breath dyspnea    do to pain  . Sleep apnea    never had a sleep study,but states Dr. Cindie Laroche says he has it  . Slurred speech   . Stroke ([place]) 08/2013   7 mini-strokes, last stroke 2017  . TIA (transient ischemic attack) 2014   x 7   . Urinary frequency     Patient Active Problem List   Diagnosis Date Noted  . Radiculopathy 06/16/2018  . Acute CVA (cerebrovascular accident) ([place]) 02/01/2017  . Diabetes mellitus ([place]) 02/01/2017  . HTN  (hypertension) 02/01/2017  . Primary osteoarthritis of left hip 01/08/2016  . DJD (degenerative joint disease) 03/20/2015  . Primary osteoarthritis of right hip 02/26/2015  . Lumbar spondylosis 10/17/2014  . Lumbago 07/25/2014  . Abnormality of gait 07/25/2014  . Difficulty in walking(719.7) 07/25/2014  . TIA (transient ischemic attack) 03/09/2013  . Cocaine abuse ([place]) 03/09/2013  . Accelerated hypertension 03/09/2013  . Hyperlipidemia 03/09/2013  . Current smoker 03/09/2013    Past Surgical History:  Procedure Laterality Date  . ABDOMINAL EXPOSURE N/A 06/16/2018   Procedure: ABDOMINAL EXPOSURE;  Surgeon: Rosetta Posner, MD;  Location: [facility];  Service: Vascular;  Laterality: N/A;  . ANKLE SURGERY  2008   left ankle-otif-[facility]  . ANTERIOR LUMBAR FUSION Bilateral 06/16/2018   Procedure: LUMBAR 4-5 LUMBAR 5-SACRUM 1 ANTERIOR LUMBAR INTERBODY FUSION WITH INSTRUMENTATION AND ALLOGRAFT;  Surgeon: Phylliss Bob, MD;  Location: [facility];  Service: Orthopedics;  Laterality: Bilateral;  . BACK SURGERY    . IR RADIOLOGIST EVAL & MGMT  07/27/2018  . IR US GUIDE BX ASP/DRAIN  07/14/2018  . JOINT REPLACEMENT     both hips replaced   . LUMBAR LAMINECTOMY/DECOMPRESSION MICRODISCECTOMY Right 10/17/2014   Procedure: LUMBAR LAMINECTOMY/DECOMPRESSION MICRODISCECTOMY 2 LEVELS;  Surgeon: Nena Polio  Kathyrn Sheriff, MD;  Location: [facility];  Service: Neurosurgery;  Laterality: Right;  Right L45 L5S1 laminectomy and foraminotomy  . MASS EXCISION  09/13/2012   Procedure: EXCISION MASS;  Surgeon: Jamesetta So, MD;  Location: [facility];  Service: General;  Laterality: N/A;  . TONSILLECTOMY    . TOTAL HIP ARTHROPLASTY Right 03/20/2015  . TOTAL HIP ARTHROPLASTY Right 03/20/2015   Procedure: TOTAL HIP ARTHROPLASTY ANTERIOR APPROACH;  Surgeon: Renette Butters, MD;  Location: [facility];  Service: Orthopedics;  Laterality: Right;  . TOTAL HIP ARTHROPLASTY Left 01/08/2016   Procedure: TOTAL HIP ARTHROPLASTY ANTERIOR APPROACH;   Surgeon: Renette Butters, MD;  Location: [facility];  Service: Orthopedics;  Laterality: Left;        Home Medications    Prior to Admission medications   Medication Sig Start Date End Date Taking? Authorizing Provider  amLODipine (NORVASC) 10 MG tablet Take 10 mg by mouth daily.   Yes [provider]  Ascorbic Acid (VITAMIN C) 1000 MG tablet Take 1,000 mg by mouth daily.   Yes [provider]  aspirin 81 MG tablet Take 1 tablet (81 mg total) by mouth daily. 06/14/18  Yes Croitoru, Mihai, MD  benazepril (LOTENSIN) 20 MG tablet Take 20 mg by mouth daily.   Yes [provider]  diazepam (VALIUM) 5 MG tablet Take 1 tablet (5 mg total) by mouth every 6 (six) hours as needed for muscle spasms. 06/19/18  Yes Gary Fleet, PA-C  ferrous gluconate (IRON 27) 240 (27 FE) MG tablet Take 240 mg by mouth daily.   Yes [provider]  folic acid (FOLVITE) 458 MCG tablet Take 400 mcg by mouth daily.   Yes [provider]  gabapentin (NEURONTIN) 300 MG capsule Take 300 mg by mouth 3 (three) times daily.   Yes [provider]  glucose 4 GM chewable tablet Chew 1 tablet by mouth as needed for low blood sugar.   Yes [provider]  labetalol (NORMODYNE) 200 MG tablet Take 200 mg by mouth 2 (two) times daily.   Yes [provider]  metFORMIN (GLUCOPHAGE) 500 MG tablet Take 500 mg by mouth 2 (two) times daily with a meal.   Yes [provider]  Multiple Vitamin (MULTIVITAMIN WITH MINERALS) TABS tablet Take 1 tablet by mouth daily.   Yes [provider]  naproxen (NAPROSYN) 500 MG tablet Take 500 mg by mouth 2 (two) times daily as needed for mild pain.   Yes [provider]  Omega-3 Fatty Acids (FISH OIL) 1000 MG CAPS Take 1,000 mg by mouth daily.   Yes [provider]  oxyCODONE (OXY IR/ROXICODONE) 5 MG immediate release tablet Take 5 mg by mouth 4 (four) times daily as needed for moderate pain.    Yes  [provider]  simvastatin (ZOCOR) 40 MG tablet Take 40 mg by mouth daily.   Yes [provider]  vitamin E 200 UNIT capsule Take 200 Units by mouth daily.   Yes [provider]    Family History Family History  Problem Relation Age of Onset  . Heart disease Father     Social History Social History   Tobacco Use  . Smoking status: Current Every Day Smoker    Packs/day: 0.25    Years: 47.00    Pack years: 11.75    Types: Cigarettes  . Smokeless tobacco: Never Used  . Tobacco comment: Less thant 1/4 of a pack.   Substance Use Topics  .  Alcohol use: No    Comment: quit 2012  . Drug use: Not Currently    Types: Cocaine    Comment: many yrs ago., last time- late 2016     Allergies   Poison ivy extract [poison ivy extract]   Review of Systems Review of Systems  All other systems reviewed and are negative.    Physical Exam Updated Vital Signs BP 127/90 (BP Location: Right Arm)   Pulse (!) 59   Temp 97.8 F (36.6 C) (Oral)   Resp 18   Ht 6\' 6"  (1.981 m)   Wt 113.4 kg   SpO2 100%   BMI 28.89 kg/m   Physical Exam  Constitutional: He is oriented to person, place, and time. He appears well-developed and well-nourished. He does not appear ill.  HENT:  Head: Normocephalic and atraumatic.  Right Ear: External ear normal.  Left Ear: External ear normal.  Eyes: Pupils are equal, round, and reactive to light. Conjunctivae and EOM are normal.  Neck: Normal range of motion and phonation normal. Neck supple.  Cardiovascular: Normal rate, regular rhythm and normal heart sounds.  Pulmonary/Chest: Effort normal and breath sounds normal. He exhibits no bony tenderness.  Abdominal: Soft. He exhibits mass. There is tenderness. There is no rebound and no guarding. No hernia.  Tender mass, left lower abdomen, approximately 8 x 10 cm, without overlying skin change or drainage.  Musculoskeletal: Normal range of motion.  Neurological: He is alert and  oriented to person, place, and time. No cranial nerve deficit or sensory deficit. He exhibits normal muscle tone. Coordination normal.  Skin: Skin is warm, dry and intact.  Psychiatric: He has a normal mood and affect. His behavior is normal. Judgment and thought content normal.  Nursing note and vitals reviewed.    ED Treatments / Results  Labs (all labs ordered are listed, but only abnormal results are displayed) Labs Reviewed  COMPREHENSIVE METABOLIC PANEL - Abnormal; Notable for the following components:      Result Value   Creatinine, Ser 1.43 (*)    GFR calc non Af Amer 52 (*)    All other components within normal limits  URINALYSIS, ROUTINE W REFLEX MICROSCOPIC - Abnormal; Notable for the following components:   Color, Urine AMBER (*)    Bilirubin Urine SMALL (*)    Ketones, ur 5 (*)    Protein, ur 100 (*)    All other components within normal limits  LIPASE, BLOOD  CBC    EKG None  Radiology Ct Abdomen Pelvis W Contrast  Result Date: 08/11/2018 CLINICAL DATA:  Post anterior lumbar surgery, postoperative fluid collection, drainage tube removed on 07/27/2018, question fluid building up in abdomen again EXAM: CT ABDOMEN AND PELVIS WITH CONTRAST TECHNIQUE: Multidetector CT imaging of the abdomen and pelvis was performed using the standard protocol following bolus administration of intravenous contrast. Sagittal and coronal MPR images reconstructed from axial data set. CONTRAST:  176mL ISOVUE-300 IOPAMIDOL (ISOVUE-300) INJECTION 61% IV. No oral contrast. COMPARISON:  07/27/2018 FINDINGS: Lower chest: Subsegmental atelectasis LEFT lower lobe. Eventration LEFT diaphragm. Hepatobiliary: Contracted gallbladder.  Liver normal appearance. Pancreas: Normal appearance Spleen: Normal appearance.  Adjacent tiny splenules. Adrenals/Urinary Tract: Adrenal glands normal appearance. Mass identified at lateral aspect of upper to mid LEFT kidney demonstrating irregular peripheral enhancement and  central low attenuation question necrosis, overall measuring 4.1 x 3.8 x 3.7 cm, consistent with a renal neoplasm. No additional renal masses. No renal vein involvement. Perinephric fat planes clear. No hydronephrosis, hydroureter or urinary  tract calcification. Bladder obscured by beam hardening artifacts in pelvis. Stomach/Bowel: Normal appearing retrocecal appendix. Stomach and bowel loops otherwise normal appearance. Vascular/Lymphatic: Atherosclerotic calcifications aorta and iliac arteries without aneurysm. No adenopathy. Reproductive: Obscured by beam hardening artifacts Other: No free air or free fluid. Again identified large collection in the LEFT anterior abdominal wall of the mid abdomen and pelvis, contained by the superficial layer of the rectus fascia, 8.8 x 4.2 x 7.4 cm in size, may either represent a sterile or infected postoperative collection, slightly smaller than on the previous exam. No hernia. Musculoskeletal: BILATERAL hip prostheses with beam hardening artifacts in pelvis. Prior lower lumbar fusion procedures. No acute osseous findings. IMPRESSION: Large LEFT anterior abdominal wall fluid collection 8.8 x 4.2 x 7.4 cm, slightly smaller than on previous exam, consistent with either a sterile or infected postoperative collection. 4.1 x 3.8 x 3.7 cm diameter enhancing LEFT renal neoplasm again noted. No new abnormalities. Electronically Signed   By: Lavonia Dana M.D.   On: 08/11/2018 23:07    Procedures Procedures (including critical care time)  Medications Ordered in ED Medications  ondansetron (ZOFRAN-ODT) disintegrating tablet 4 mg (has no administration in time range)  iopamidol (ISOVUE-300) 61 % injection (has no administration in time range)  iopamidol (ISOVUE-300) 61 % injection 100 mL (100 mLs Intravenous Contrast Given 08/11/18 2231)     Initial Impression / Assessment and Plan / ED Course  I have reviewed the triage vital signs and the nursing notes.  Pertinent labs &  imaging results that were available during my care of the patient were reviewed by me and considered in my medical decision making (see chart for details).  Clinical Course as of Aug 11 2342  Wed Aug 11, 2018  2256 Normal except small amount of bilirubin present, ketones high, protein high  Urinalysis, Routine w reflex microscopic(!) [EW]  2256 Normal except creatinine elevated  Comprehensive metabolic panel(!) [EW]  1962 Normal  Lipase, blood [EW]  2256 Normal  CBC [EW]  2314 Left lower abdominal wall fluid collection, slightly smaller than prior, with different appearance of the fluid within it.  Also left renal lesion, which appears stable compared to prior imaging.  CT ABDOMEN PELVIS W CONTRAST [EW]  2330 Case discussed with vascular surgery, Dr. Donnetta Hutching who was involved with prior surgical procedure resulting in the seroma and the care given by interventional radiology, several weeks ago.  He states that since the patient is nontoxic, patient can be discharged and he will arrange for surgical procedure to be done on 08/13/2018,, to definitively manage the seroma.   [EW]    Clinical Course User Index [EW] Daleen Bo, MD     Patient Vitals for the past 24 hrs:  BP Temp Temp src Pulse Resp SpO2 Height Weight  08/11/18 2124 127/90 97.8 F (36.6 C) Oral (!) 59 18 100 % - -  08/11/18 1731 115/80 98.3 F (36.8 C) Oral 69 16 99 % 6\' 6"  (1.981 m) 113.4 kg  08/11/18 1730 - - - - - 99 % - -    11:38 PM Reevaluation with update and discussion. After initial assessment and treatment, an updated evaluation reveals patient states he is more comfortable now since he has been resting and talking to his family members.  Findings discussed and questions answered.  We discussed the CT results including the left kidney cancer, patient and family state that they know about it and they are working on getting it fixed with a urologist.  Patient  and family members are comfortable with him being  discharged, and the plans for surgical revision, in 2 days. Daleen Bo   Medical Decision Making: Persistent abdominal wall seroma, without evidence for acute infection, metabolic instability or impending vascular collapse.  No indication for hospitalization at this time.  Arrangements made for surgical revision, in 2 days.  CRITICAL CARE-no Performed by: Daleen Bo   Nursing Notes Reviewed/ Care Coordinated Applicable Imaging Reviewed Interpretation of Laboratory Data incorporated into ED treatment  The patient appears reasonably screened and/or stabilized for discharge and I doubt any other medical condition or other [facility] requiring further screening, evaluation, or treatment in the ED at this time prior to discharge.  Plan: Home Medications-continue usual medications; Home Treatments-heat to affected area; return here if the recommended treatment, does not improve the symptoms; Recommended follow up-vascular surgery will call tomorrow to make arrangements for surgical procedure in 2 days.     Final Clinical Impressions(s) / ED Diagnoses   Final diagnoses:  Abdominal wall seroma, subsequent encounter  Carcinoma of left kidney [facility])    ED Discharge Orders    None       Daleen Bo, MD 08/11/18 2343

## 2018-08-11 NOTE — Telephone Encounter (Signed)
Call from patient's wife. States patient " is in severe pain and abdominal hematoma where drain was removed is filling up again and getting bigger".  She stated issue and intense and severe pain and his need is urgent. Very upset that they have been unable to get medication Rx. Filled due to some issue with paper work from Dr. Laurena Bering office. Informed here this office can not do anything about the paper work. I instructed her to go to [facility] hospital ER. For evaluation and [facility] surgeon on call can be reached.

## 2018-08-12 ENCOUNTER — Other Ambulatory Visit: Payer: Self-pay | Admitting: *Deleted

## 2018-08-12 ENCOUNTER — Telehealth: Payer: Self-pay | Admitting: *Deleted

## 2018-08-12 ENCOUNTER — Encounter (HOSPITAL_COMMUNITY): Payer: Self-pay

## 2018-08-12 NOTE — Telephone Encounter (Signed)
Call to patient's wife. Instructed NPO past MN tonight. To be at [facility] admitting department at 9:15 am on 08/13/18 for surgery. Expect a call and follow the detailed instructions received from the hospital pre-admission department for this surgery. Butch Penny at Dr. Laurena Bering office notified of patient's pending surgery.

## 2018-08-12 NOTE — Progress Notes (Signed)
Notified patient to arrive at short stay at 0930. Patient instructed to take benzapril, and gabapentin morning of surgery. If needed, he can take his oxycodone and valium. Patient also instructed to check blood sugar in the morning and if his CBG<70 to correct with half a cup of clear juice such as apple or cranberry juice. Patient verbalized understanding.

## 2018-08-13 ENCOUNTER — Ambulatory Visit (HOSPITAL_COMMUNITY): Payer: Medicaid Other | Admitting: Anesthesiology

## 2018-08-13 ENCOUNTER — Encounter (HOSPITAL_COMMUNITY)
Admission: RE | Disposition: A | Discharge status: Home / Self Care | Payer: Self-pay | Source: Ambulatory Visit | Attending: Vascular Surgery

## 2018-08-13 ENCOUNTER — Encounter (HOSPITAL_COMMUNITY): Payer: Self-pay | Admitting: Certified Registered"

## 2018-08-13 ENCOUNTER — Other Ambulatory Visit: Payer: Self-pay

## 2018-08-13 ENCOUNTER — Observation Stay (HOSPITAL_COMMUNITY)
Admission: RE | Admit: 2018-08-13 | Discharge: 2018-08-14 | Disposition: A | Discharge status: Home / Self Care | Payer: Medicaid Other | Source: Ambulatory Visit | Attending: Vascular Surgery | Admitting: Vascular Surgery

## 2018-08-13 ENCOUNTER — Telehealth: Payer: Self-pay | Admitting: Vascular Surgery

## 2018-08-13 DIAGNOSIS — Z79891 Long term (current) use of opiate analgesic: Secondary | ICD-10-CM | POA: Insufficient documentation

## 2018-08-13 DIAGNOSIS — Z7982 Long term (current) use of aspirin: Secondary | ICD-10-CM | POA: Insufficient documentation

## 2018-08-13 DIAGNOSIS — E119 Type 2 diabetes mellitus without complications: Secondary | ICD-10-CM | POA: Insufficient documentation

## 2018-08-13 DIAGNOSIS — Z7984 Long term (current) use of oral hypoglycemic drugs: Secondary | ICD-10-CM | POA: Insufficient documentation

## 2018-08-13 DIAGNOSIS — L7632 Postprocedural hematoma of skin and subcutaneous tissue following other procedure: Secondary | ICD-10-CM | POA: Diagnosis present

## 2018-08-13 DIAGNOSIS — I6930 Unspecified sequelae of cerebral infarction: Secondary | ICD-10-CM | POA: Diagnosis not present

## 2018-08-13 DIAGNOSIS — G4730 Sleep apnea, unspecified: Secondary | ICD-10-CM | POA: Diagnosis not present

## 2018-08-13 DIAGNOSIS — S301XXA Contusion of abdominal wall, initial encounter: Secondary | ICD-10-CM | POA: Diagnosis present

## 2018-08-13 DIAGNOSIS — M96840 Postprocedural hematoma of a musculoskeletal structure following a musculoskeletal system procedure: Secondary | ICD-10-CM | POA: Diagnosis not present

## 2018-08-13 DIAGNOSIS — I252 Old myocardial infarction: Secondary | ICD-10-CM | POA: Diagnosis not present

## 2018-08-13 DIAGNOSIS — I10 Essential (primary) hypertension: Secondary | ICD-10-CM | POA: Insufficient documentation

## 2018-08-13 DIAGNOSIS — Z79899 Other long term (current) drug therapy: Secondary | ICD-10-CM | POA: Insufficient documentation

## 2018-08-13 HISTORY — PX: HEMATOMA EVACUATION: SHX5118

## 2018-08-13 LAB — CBC
HEMATOCRIT: 37.7 % — AB (ref 39.0–52.0)
HEMOGLOBIN: 12.2 g/dL — AB (ref 13.0–17.0)
MCH: 29.4 pg (ref 26.0–34.0)
MCHC: 32.4 g/dL (ref 30.0–36.0)
MCV: 90.8 fL (ref 78.0–100.0)
Platelets: 215 10*3/uL (ref 150–400)
RBC: 4.15 MIL/uL — ABNORMAL LOW (ref 4.22–5.81)
RDW: 13.6 % (ref 11.5–15.5)
WBC: 5.7 10*3/uL (ref 4.0–10.5)

## 2018-08-13 LAB — GLUCOSE, CAPILLARY
GLUCOSE-CAPILLARY: 108 mg/dL — AB (ref 70–99)
Glucose-Capillary: 126 mg/dL — ABNORMAL HIGH (ref 70–99)
Glucose-Capillary: 118 mg/dL — ABNORMAL HIGH (ref 70–99)
Glucose-Capillary: 119 mg/dL — ABNORMAL HIGH (ref 70–99)

## 2018-08-13 LAB — CREATININE, SERUM
Creatinine, Ser: 1.48 mg/dL — ABNORMAL HIGH (ref 0.61–1.24)
GFR calc Af Amer: 58 mL/min — ABNORMAL LOW (ref >60)
GFR calc non Af Amer: 50 mL/min — ABNORMAL LOW (ref >60)

## 2018-08-13 SURGERY — EVACUATION HEMATOMA
Anesthesia: General | Site: Abdomen | Laterality: Left

## 2018-08-13 MED ORDER — ONDANSETRON HCL 4 MG/2ML IJ SOLN
INTRAMUSCULAR | Status: AC
  Filled 2018-08-13: qty 2

## 2018-08-13 MED ORDER — SODIUM CHLORIDE 0.9 % IR SOLN
Status: DC | PRN
  Administered 2018-08-13: 1000 mL

## 2018-08-13 MED ORDER — PROPOFOL 10 MG/ML IV BOLUS
INTRAVENOUS | Status: AC
  Filled 2018-08-13: qty 20

## 2018-08-13 MED ORDER — ONDANSETRON HCL 4 MG/2ML IJ SOLN
4.0000 mg | Freq: Once | INTRAMUSCULAR | Status: AC | PRN
  Administered 2018-08-13: 4 mg via INTRAVENOUS

## 2018-08-13 MED ORDER — HYDRALAZINE HCL 20 MG/ML IJ SOLN: 5.0000 mg | INTRAMUSCULAR | Status: DC | PRN

## 2018-08-13 MED ORDER — CHLORHEXIDINE GLUCONATE 4 % EX LIQD: 60.0000 mL | Freq: Once | CUTANEOUS | 0 refills | Status: AC

## 2018-08-13 MED ORDER — AMLODIPINE BESYLATE 10 MG PO TABS
10.0000 mg | ORAL_TABLET | Freq: Every day | ORAL | Status: DC
  Administered 2018-08-13 – 2018-08-14 (×2): 10 mg via ORAL
  Filled 2018-08-13 (×2): qty 1

## 2018-08-13 MED ORDER — CHLORHEXIDINE GLUCONATE 4 % EX LIQD: 60.0000 mL | Freq: Once | CUTANEOUS | Status: DC

## 2018-08-13 MED ORDER — HYDROCODONE-ACETAMINOPHEN 5-325 MG PO TABS
1.0000 | ORAL_TABLET | ORAL | Status: DC | PRN
  Administered 2018-08-13: 1 via ORAL
  Filled 2018-08-13: qty 1

## 2018-08-13 MED ORDER — FENTANYL CITRATE (PF) 100 MCG/2ML IJ SOLN
25.0000 ug | INTRAMUSCULAR | Status: DC | PRN
  Administered 2018-08-13: 50 ug via INTRAVENOUS

## 2018-08-13 MED ORDER — SIMVASTATIN 40 MG PO TABS
40.0000 mg | ORAL_TABLET | Freq: Every day | ORAL | Status: DC
  Administered 2018-08-13 – 2018-08-14 (×2): 40 mg via ORAL
  Filled 2018-08-13 (×2): qty 1

## 2018-08-13 MED ORDER — DOCUSATE SODIUM 100 MG PO CAPS
100.0000 mg | ORAL_CAPSULE | Freq: Two times a day (BID) | ORAL | Status: DC
  Administered 2018-08-13 – 2018-08-14 (×3): 100 mg via ORAL
  Filled 2018-08-13 (×3): qty 1

## 2018-08-13 MED ORDER — LIDOCAINE 2% (20 MG/ML) 5 ML SYRINGE
INTRAMUSCULAR | Status: DC | PRN
  Administered 2018-08-13: 80 mg via INTRAVENOUS

## 2018-08-13 MED ORDER — CEFAZOLIN SODIUM-DEXTROSE 2-4 GM/100ML-% IV SOLN
2.0000 g | INTRAVENOUS | Status: AC
  Administered 2018-08-13: 2 g via INTRAVENOUS

## 2018-08-13 MED ORDER — OXYCODONE HCL 5 MG PO TABS
ORAL_TABLET | ORAL | Status: AC
  Filled 2018-08-13: qty 1

## 2018-08-13 MED ORDER — FENTANYL CITRATE (PF) 100 MCG/2ML IJ SOLN
50.0000 ug | Freq: Once | INTRAMUSCULAR | Status: AC | PRN
  Administered 2018-08-13: 50 ug via INTRAVENOUS
  Filled 2018-08-13: qty 2

## 2018-08-13 MED ORDER — ONDANSETRON HCL 4 MG/2ML IJ SOLN
INTRAMUSCULAR | Status: DC | PRN
  Administered 2018-08-13: 4 mg via INTRAVENOUS

## 2018-08-13 MED ORDER — SODIUM CHLORIDE 0.9 % IV SOLN
INTRAVENOUS | Status: DC | PRN
  Administered 2018-08-13: 50 ug/min via INTRAVENOUS

## 2018-08-13 MED ORDER — LABETALOL HCL 100 MG PO TABS
200.0000 mg | ORAL_TABLET | Freq: Two times a day (BID) | ORAL | Status: DC
  Administered 2018-08-13 – 2018-08-14 (×3): 200 mg via ORAL
  Filled 2018-08-13 (×3): qty 2

## 2018-08-13 MED ORDER — METFORMIN HCL 500 MG PO TABS
500.0000 mg | ORAL_TABLET | Freq: Two times a day (BID) | ORAL | Status: DC
  Administered 2018-08-13 – 2018-08-14 (×2): 500 mg via ORAL
  Filled 2018-08-13 (×2): qty 1

## 2018-08-13 MED ORDER — MIDAZOLAM HCL 2 MG/2ML IJ SOLN
INTRAMUSCULAR | Status: AC
  Filled 2018-08-13: qty 2

## 2018-08-13 MED ORDER — BENAZEPRIL HCL 20 MG PO TABS
20.0000 mg | ORAL_TABLET | Freq: Every day | ORAL | Status: DC
  Administered 2018-08-14: 20 mg via ORAL
  Filled 2018-08-13 (×2): qty 1

## 2018-08-13 MED ORDER — POTASSIUM CHLORIDE CRYS ER 20 MEQ PO TBCR
20.0000 meq | EXTENDED_RELEASE_TABLET | Freq: Once | ORAL | Status: DC
  Filled 2018-08-13: qty 2

## 2018-08-13 MED ORDER — MORPHINE SULFATE (PF) 2 MG/ML IV SOLN
2.0000 mg | INTRAVENOUS | Status: DC | PRN
  Administered 2018-08-14: 2 mg via INTRAVENOUS
  Filled 2018-08-13 (×2): qty 1

## 2018-08-13 MED ORDER — SODIUM CHLORIDE 0.9 % IV SOLN: INTRAVENOUS | Status: DC

## 2018-08-13 MED ORDER — ENOXAPARIN SODIUM 30 MG/0.3ML ~~LOC~~ SOLN: 30.0000 mg | SUBCUTANEOUS | Status: DC

## 2018-08-13 MED ORDER — METOPROLOL TARTRATE 5 MG/5ML IV SOLN: 2.0000 mg | INTRAVENOUS | Status: DC | PRN

## 2018-08-13 MED ORDER — ADULT MULTIVITAMIN W/MINERALS CH
1.0000 | ORAL_TABLET | Freq: Every day | ORAL | Status: DC
  Administered 2018-08-13 – 2018-08-14 (×2): 1 via ORAL
  Filled 2018-08-13 (×2): qty 1

## 2018-08-13 MED ORDER — ONDANSETRON HCL 4 MG/2ML IJ SOLN
4.0000 mg | Freq: Four times a day (QID) | INTRAMUSCULAR | Status: DC | PRN
  Administered 2018-08-13 – 2018-08-14 (×2): 4 mg via INTRAVENOUS
  Filled 2018-08-13 (×2): qty 2

## 2018-08-13 MED ORDER — SODIUM CHLORIDE 0.9 % IV SOLN
INTRAVENOUS | Status: AC
  Filled 2018-08-13: qty 1.2

## 2018-08-13 MED ORDER — PHENYLEPHRINE 40 MCG/ML (10ML) SYRINGE FOR IV PUSH (FOR BLOOD PRESSURE SUPPORT)
PREFILLED_SYRINGE | INTRAVENOUS | Status: DC | PRN
  Administered 2018-08-13: 160 ug via INTRAVENOUS
  Administered 2018-08-13: 80 ug via INTRAVENOUS
  Administered 2018-08-13: 40 ug via INTRAVENOUS
  Administered 2018-08-13: 120 ug via INTRAVENOUS

## 2018-08-13 MED ORDER — FENTANYL CITRATE (PF) 250 MCG/5ML IJ SOLN
INTRAMUSCULAR | Status: AC
  Filled 2018-08-13: qty 5

## 2018-08-13 MED ORDER — HYDROCODONE-ACETAMINOPHEN 5-325 MG PO TABS: 1.0000 | ORAL_TABLET | ORAL | 0 refills | Status: DC | PRN

## 2018-08-13 MED ORDER — GLUCOSE 4 G PO CHEW: 1.0000 | CHEWABLE_TABLET | ORAL | Status: DC | PRN

## 2018-08-13 MED ORDER — LABETALOL HCL 5 MG/ML IV SOLN
10.0000 mg | INTRAVENOUS | Status: DC | PRN
  Filled 2018-08-13: qty 4

## 2018-08-13 MED ORDER — DIAZEPAM 5 MG PO TABS: 5.0000 mg | ORAL_TABLET | Freq: Four times a day (QID) | ORAL | Status: DC | PRN

## 2018-08-13 MED ORDER — PROPOFOL 10 MG/ML IV BOLUS
INTRAVENOUS | Status: DC | PRN
  Administered 2018-08-13: 170 mg via INTRAVENOUS

## 2018-08-13 MED ORDER — LIDOCAINE 2% (20 MG/ML) 5 ML SYRINGE
INTRAMUSCULAR | Status: AC
  Filled 2018-08-13: qty 5

## 2018-08-13 MED ORDER — ONDANSETRON HCL 4 MG/2ML IJ SOLN: 4.0000 mg | Freq: Four times a day (QID) | INTRAMUSCULAR | Status: DC | PRN

## 2018-08-13 MED ORDER — PANTOPRAZOLE SODIUM 40 MG PO TBEC
40.0000 mg | DELAYED_RELEASE_TABLET | Freq: Every day | ORAL | Status: DC
  Administered 2018-08-13 – 2018-08-14 (×2): 40 mg via ORAL
  Filled 2018-08-13 (×2): qty 1

## 2018-08-13 MED ORDER — OXYCODONE HCL 5 MG/5ML PO SOLN: 5.0000 mg | Freq: Once | ORAL | Status: DC | PRN

## 2018-08-13 MED ORDER — CEFAZOLIN SODIUM-DEXTROSE 2-4 GM/100ML-% IV SOLN
INTRAVENOUS | Status: AC
  Filled 2018-08-13: qty 100

## 2018-08-13 MED ORDER — OXYCODONE HCL 5 MG PO TABS: 5.0000 mg | ORAL_TABLET | Freq: Once | ORAL | Status: DC | PRN

## 2018-08-13 MED ORDER — GABAPENTIN 300 MG PO CAPS
300.0000 mg | ORAL_CAPSULE | Freq: Three times a day (TID) | ORAL | Status: DC
  Administered 2018-08-13 – 2018-08-14 (×3): 300 mg via ORAL
  Filled 2018-08-13 (×3): qty 1

## 2018-08-13 MED ORDER — ASPIRIN 81 MG PO CHEW
81.0000 mg | CHEWABLE_TABLET | Freq: Every day | ORAL | Status: DC
  Administered 2018-08-13 – 2018-08-14 (×2): 81 mg via ORAL
  Filled 2018-08-13 (×2): qty 1

## 2018-08-13 MED ORDER — FENTANYL CITRATE (PF) 100 MCG/2ML IJ SOLN
INTRAMUSCULAR | Status: AC
  Filled 2018-08-13: qty 2

## 2018-08-13 MED ORDER — SENNOSIDES-DOCUSATE SODIUM 8.6-50 MG PO TABS: 1.0000 | ORAL_TABLET | Freq: Every evening | ORAL | Status: DC | PRN

## 2018-08-13 MED ORDER — ALUM & MAG HYDROXIDE-SIMETH 200-200-20 MG/5ML PO SUSP: 15.0000 mL | ORAL | Status: DC | PRN

## 2018-08-13 MED ORDER — LACTATED RINGERS IV SOLN
INTRAVENOUS | Status: DC
  Administered 2018-08-13: 11:00:00 via INTRAVENOUS

## 2018-08-13 MED ORDER — BISACODYL 5 MG PO TBEC: 5.0000 mg | DELAYED_RELEASE_TABLET | Freq: Every day | ORAL | Status: DC | PRN

## 2018-08-13 MED ORDER — GUAIFENESIN-DM 100-10 MG/5ML PO SYRP: 15.0000 mL | ORAL_SOLUTION | ORAL | Status: DC | PRN

## 2018-08-13 MED ORDER — HYDROMORPHONE HCL 1 MG/ML IJ SOLN
0.5000 mg | INTRAMUSCULAR | Status: DC | PRN
  Administered 2018-08-13: 1 mg via INTRAVENOUS
  Filled 2018-08-13: qty 1

## 2018-08-13 MED ORDER — NAPROXEN 250 MG PO TABS: 500.0000 mg | ORAL_TABLET | Freq: Two times a day (BID) | ORAL | Status: DC | PRN

## 2018-08-13 MED ORDER — PHENOL 1.4 % MT LIQD: 1.0000 | OROMUCOSAL | Status: DC | PRN

## 2018-08-13 MED ORDER — EPHEDRINE SULFATE-NACL 50-0.9 MG/10ML-% IV SOSY
PREFILLED_SYRINGE | INTRAVENOUS | Status: DC | PRN
  Administered 2018-08-13 (×2): 15 mg via INTRAVENOUS

## 2018-08-13 SURGICAL SUPPLY — 50 items
ADH SKN CLS APL DERMABOND .7 (GAUZE/BANDAGES/DRESSINGS)
ADH SKN CLS LQ APL DERMABOND (GAUZE/BANDAGES/DRESSINGS) ×1
CANISTER SUCT 3000ML PPV (MISCELLANEOUS) ×2 IMPLANT
CLIP LIGATING EXTRA MED SLVR (CLIP) IMPLANT
CLIP LIGATING EXTRA SM BLUE (MISCELLANEOUS) IMPLANT
COVER SURGICAL LIGHT HANDLE (MISCELLANEOUS) ×1 IMPLANT
DERMABOND ADHESIVE PROPEN (GAUZE/BANDAGES/DRESSINGS) ×1
DERMABOND ADVANCED (GAUZE/BANDAGES/DRESSINGS)
DERMABOND ADVANCED .7 DNX12 (GAUZE/BANDAGES/DRESSINGS) IMPLANT
DERMABOND ADVANCED .7 DNX6 (GAUZE/BANDAGES/DRESSINGS) IMPLANT
DRAIN CHANNEL 15F RND FF W/TCR (WOUND CARE) ×1 IMPLANT
DRAIN SNY 10X20 3/4 PERF (WOUND CARE) IMPLANT
DRAPE LAPAROSCOPIC ABDOMINAL (DRAPES) ×1 IMPLANT
DRAPE X-RAY CASS 24X20 (DRAPES) IMPLANT
ELECT REM PT RETURN 9FT ADLT (ELECTROSURGICAL) ×2
ELECTRODE REM PT RTRN 9FT ADLT (ELECTROSURGICAL) ×1 IMPLANT
EVACUATOR SILICONE 100CC (DRAIN) ×1 IMPLANT
GAUZE SPONGE 4X4 12PLY STRL (GAUZE/BANDAGES/DRESSINGS) ×1 IMPLANT
GLOVE SS BIOGEL STRL SZ 7.5 (GLOVE) ×1 IMPLANT
GLOVE SUPERSENSE BIOGEL SZ 7.5 (GLOVE) ×1
GOWN STRL REUS W/ TWL LRG LVL3 (GOWN DISPOSABLE) ×3 IMPLANT
GOWN STRL REUS W/TWL LRG LVL3 (GOWN DISPOSABLE) ×6
KIT BASIN OR (CUSTOM PROCEDURE TRAY) ×2 IMPLANT
KIT TURNOVER KIT B (KITS) ×2 IMPLANT
NS IRRIG 1000ML POUR BTL (IV SOLUTION) ×4 IMPLANT
PACK CV ACCESS (CUSTOM PROCEDURE TRAY) IMPLANT
PACK GENERAL/GYN (CUSTOM PROCEDURE TRAY) ×1 IMPLANT
PACK PERIPHERAL VASCULAR (CUSTOM PROCEDURE TRAY) IMPLANT
PACK UNIVERSAL I (CUSTOM PROCEDURE TRAY) IMPLANT
PAD ARMBOARD 7.5X6 YLW CONV (MISCELLANEOUS) ×4 IMPLANT
PADDING CAST COTTON 6X4 STRL (CAST SUPPLIES) IMPLANT
SET COLLECT BLD 21X3/4 12 (NEEDLE) IMPLANT
STAPLER VISISTAT 35W (STAPLE) IMPLANT
STOPCOCK 4 WAY LG BORE MALE ST (IV SETS) IMPLANT
SUT ETHILON 3 0 PS 1 (SUTURE) ×1 IMPLANT
SUT PROLENE 5 0 C 1 24 (SUTURE) IMPLANT
SUT PROLENE 6 0 CC (SUTURE) IMPLANT
SUT VIC AB 2-0 CT1 27 (SUTURE) ×2
SUT VIC AB 2-0 CT1 TAPERPNT 27 (SUTURE) IMPLANT
SUT VIC AB 2-0 CTX 36 (SUTURE) IMPLANT
SUT VIC AB 3-0 SH 27 (SUTURE) ×2
SUT VIC AB 3-0 SH 27X BRD (SUTURE) IMPLANT
SWAB COLLECTION DEVICE MRSA (MISCELLANEOUS) ×1 IMPLANT
SWAB CULTURE ESWAB REG 1ML (MISCELLANEOUS) ×1 IMPLANT
TAPE CLOTH SURG 4X10 WHT LF (GAUZE/BANDAGES/DRESSINGS) ×1 IMPLANT
TOWEL GREEN STERILE (TOWEL DISPOSABLE) ×2 IMPLANT
TRAY FOLEY MTR SLVR 16FR STAT (SET/KITS/TRAYS/PACK) IMPLANT
TUBING EXTENTION W/L.L. (IV SETS) IMPLANT
UNDERPAD 30X30 (UNDERPADS AND DIAPERS) ×2 IMPLANT
WATER STERILE IRR 1000ML POUR (IV SOLUTION) ×2 IMPLANT

## 2018-08-13 NOTE — Interval H&P Note (Signed)
History and Physical Interval Note:  The patient has had pain associated with his lower abdominal wall.  Presented to the emergency room 36 hours ago.  Was not toxic and was continued to have difficulty with pain.  He reports that he is having some pain in his left lower quadrant.  He reports that he is having severe lower back pain and hip pain making it very difficult for him to walk.  He apparently has been unable to get any narcotic pain medication due to some preapproval required by Medicaid.  He and his wife both report that he is very uncomfortable from abdomen, back and hips.  CT scan does show that he has a persistent probable liquefied hematoma above the level of the rectus muscle in his left lower quadrant.  I recommended evacuation of this.  Explained that he may require a drain placed in this as well.  We will keep overnight in the hospital for pain management and discuss with Dr. Lynann Bologna regarding chronic pain management for his chronic degenerative disc disease.  Explained that we would culture his fluid but does not appear that this is infected.  He also had discovery of a left renal mass with probable being cancer on his initial CT scan on 07/10/2018.  I could not find any documentation of this is been discussed with patient.  I did raise this issue with the patient and his wife.  [facility] has seen [facility] for ongoing evaluation of this.  08/13/2018 9:37 AM     Suan Halter  has presented today for surgery, with the diagnosis of LEFT ABDOMINAL WALL HEMATOMA S/P ALIF  The various methods of treatment have been discussed with the patient and family. After consideration of risks, benefits and other options for treatment, the patient has consented to  Procedure(s): EVACUATION HEMATOMA LEFT ABDOMINAL WALL (Left) as a surgical intervention .  The patient's history has been reviewed, patient examined, no change in status, stable for surgery.  I have reviewed the patient's chart and labs.   Questions were answered to the patient's satisfaction.     Curt Jews

## 2018-08-13 NOTE — Anesthesia Procedure Notes (Signed)
Procedure Name: LMA Insertion Date/Time: 08/13/2018 12:01 PM Performed by: Myna Bright, CRNA Pre-anesthesia Checklist: Patient identified, Emergency Drugs available, Suction available and Patient being monitored Patient Re-evaluated:Patient Re-evaluated prior to induction Oxygen Delivery Method: Circle system utilized Preoxygenation: Pre-oxygenation with 100% oxygen Induction Type: IV induction LMA: LMA inserted LMA Size: 5.0 Tube type: Oral Number of attempts: 1 Placement Confirmation: positive ETCO2 and breath sounds checked- equal and bilateral Tube secured with: Tape Dental Injury: Teeth and Oropharynx as per pre-operative assessment

## 2018-08-13 NOTE — Plan of Care (Signed)
  Problem: Education: Goal: Knowledge of General Education information will improve Description Including pain rating scale, medication(s)/side effects and non-pharmacologic comfort measures Outcome: Progressing   Problem: Health Behavior/Discharge Planning: Goal: Ability to manage health-related needs will improve Outcome: Progressing   Problem: Clinical Measurements: Goal: Will remain free from infection Outcome: Progressing   Problem: Nutrition: Goal: Adequate nutrition will be maintained Outcome: Progressing   Problem: Elimination: Goal: Will not experience complications related to urinary retention Outcome: Progressing   Problem: Pain Managment: Goal: General experience of comfort will improve Outcome: Progressing   Problem: Safety: Goal: Ability to remain free from injury will improve Outcome: Progressing   Problem: Clinical Measurements: Goal: Ability to maintain clinical measurements within normal limits will improve Outcome: Progressing Goal: Postoperative complications will be avoided or minimized Outcome: Progressing   Problem: Skin Integrity: Goal: Demonstration of wound healing without infection will improve Outcome: Progressing  Forestine Chute, RN 08/13/2018 11:36 PM

## 2018-08-13 NOTE — Telephone Encounter (Signed)
sch appt spk to pt and wife 08/31/18 230pm p/o MD

## 2018-08-13 NOTE — Transfer of Care (Signed)
Immediate Anesthesia Transfer of Care Note  Patient: Raymond Barrera  Procedure(s) Performed: EVACUATION HEMATOMA LEFT ABDOMINAL WALL (Left Abdomen)  Patient Location: PACU  Anesthesia Type:General  Level of Consciousness: awake, alert , oriented and patient cooperative  Airway & Oxygen Therapy: Patient Spontanous Breathing and Patient connected to nasal cannula oxygen  Post-op Assessment: Report given to RN, Post -op Vital signs reviewed and stable and Patient moving all extremities  Post vital signs: Reviewed and stable  Last Vitals:  Vitals Value Taken Time  BP 138/95 08/13/2018  1:03 PM  Temp    Pulse 72 08/13/2018  1:03 PM  Resp 13 08/13/2018  1:03 PM  SpO2 100 % 08/13/2018  1:03 PM  Vitals shown include unvalidated device data.  Last Pain:  Vitals:   08/13/18 0912  TempSrc: Oral         Complications: No apparent anesthesia complications

## 2018-08-13 NOTE — Interval H&P Note (Signed)
History and Physical Interval Note:  08/13/2018 9:37 AM  Raymond Barrera  has presented today for surgery, with the diagnosis of LEFT ABDOMINAL WALL HEMATOMA S/P ALIF  The various methods of treatment have been discussed with the patient and family. After consideration of risks, benefits and other options for treatment, the patient has consented to  Procedure(s): EVACUATION HEMATOMA LEFT ABDOMINAL WALL (Left) as a surgical intervention .  The patient's history has been reviewed, patient examined, no change in status, stable for surgery.  I have reviewed the patient's chart and labs.  Questions were answered to the patient's satisfaction.     Curt Jews

## 2018-08-13 NOTE — Op Note (Signed)
    OPERATIVE REPORT  DATE OF SURGERY: 08/13/2018  PATIENT: Raymond Barrera, 57 y.o. male MRN: [id number]  DOB: Jan 31, 1961  PRE-OPERATIVE DIAGNOSIS: Liquefied hematoma left lower quadrant from prior spine exposure incision  POST-OPERATIVE DIAGNOSIS:  Same  PROCEDURE: Drainage of liquefied hematoma and Blake drain placement  SURGEON:  Curt Jews, M.D.  PHYSICIAN ASSISTANT: Nurse  ANESTHESIA: LMA  EBL: per anesthesia record  Total I/O In: 400 [I.V.:400] Out: 67 [Blood:50]  BLOOD ADMINISTERED: none  DRAINS: none  SPECIMEN: none  COUNTS CORRECT:  YES  PATIENT DISPOSITION:  PACU - hemodynamically stable  PROCEDURE DETAILS: Indication for procedure patient is a 57 year old gentleman who underwent prior to level spine exposure from anterior approach on 06/16/2018.  He did well and was discharged home.  Following discharge he was noted to have some fullness in his left lower quadrant and underwent initial CT scan showing hematoma above the level of the rectus muscle.  This was on 07/10/2018.  Was recommended that he undergo percutaneous drainage by interventional radiology and this was accomplished on 07/14/2018.  There was a very little drainage from the catheter.  His catheter was removed on 827 and he saw me in the office.  He continued to have a hematoma.  I explained that this was very likely to be the case since this was not a liquefied hematoma when the drain was placed.  Since this avenue of treatment have been recommended I suggested that we continue to see if this would spontaneously resolve.  There was no evidence of infection and the fluid from his initial aspiration was negative.  He presented to the emergency department 36 hours prior to this surgery.  He has a multitude of complaints.  He reports severe back pain severe hip pain on ability to stand and walk due to pain.  He does have some pain in his lower quadrant as well.  I had a long discussion with the patient and his wife  explaining that it would be extremely unlikely that this is related to this liquefied hematoma in his left lower quadrant.  Nonetheless this is persisted and I have recommended incision and drainage of hematoma.  He is taken to the operating room for this procedure  The patient was taken the operating placed supine position where the abdomen was prepped and draped you sterile fashion.  A 1-1/2 inch long incision was made through the prior incision in the lower portion of the incision.  Patient had a firm capsule around the hematoma.  Hematoma was mostly liquefied and there was some old clot in there as well.  This was all debrided.  There was no evidence of pus.  There was no erythema in the skin.  The wound was carefully irrigated.  Aerobic and anaerobic cultures were taken.  A 15 Blake drain was brought through a separate stab incision and placed in the base of the wound.  The wound was closed with 2-0 Vicryl in the subcutaneous tissue and the skin was closed with 3 oh sub-particular Vicryl suture.  Sterile dressing was applied and the patient was transferred to the recovery room in stable condition   Raymond Barrera, M.D., [facility] 08/13/2018 1:11 PM

## 2018-08-13 NOTE — Anesthesia Preprocedure Evaluation (Addendum)
Anesthesia Evaluation  Patient identified by MRN, date of birth, ID band Patient awake    Reviewed: Allergy & Precautions, NPO status , Patient's Chart, lab work & pertinent test results, reviewed documented beta blocker date and time   History of Anesthesia Complications (+) PONV, PROLONGED EMERGENCE and history of anesthetic complications  Airway Mallampati: II  TM Distance: >3 FB Neck ROM: Full    Dental  (+) Dental Advisory Given, Edentulous Upper, Partial Lower   Pulmonary sleep apnea , Current Smoker,    breath sounds clear to auscultation       Cardiovascular hypertension, Pt. on medications and Pt. on home beta blockers + Past MI   Rhythm:Regular Rate:Normal   '19 Myoperfusion - Nuclear stress EF: 24%. There was no ST segment deviation noted during stress. No T wave inversion was noted during stress. This is a high risk study. The left ventricular ejection fraction is severely decreased (<30%).  '19 TTE - LV cavity size was mildly dilated. There was   moderate concentric hypertrophy. EF 20% to 25%. Severe diffuse hypokinesis with no identifiable regional   variations.  Grade 1 diastolic dysfunction. LA was severely dilated. Direct comparison with images from March 2018 shows little change in left ventricular function.     Neuro/Psych Anxiety TIACVA, Residual Symptoms    GI/Hepatic negative GI ROS, (+)     substance abuse  cocaine use,   Endo/Other  diabetes, Type 2, Oral Hypoglycemic Agents  Renal/GU negative Renal ROS  negative genitourinary   Musculoskeletal  (+) Arthritis ,   Abdominal   Peds  Hematology negative hematology ROS (+)   Anesthesia Other Findings   Reproductive/Obstetrics                            Anesthesia Physical Anesthesia Plan  ASA: IV  Anesthesia Plan: General   Post-op Pain Management:    Induction: Intravenous  PONV Risk Score and  Plan: 3 and Treatment may vary due to age or medical condition and Ondansetron  Airway Management Planned: LMA  Additional Equipment: None  Intra-op Plan:   Post-operative Plan: Extubation in OR  Informed Consent: I have reviewed the patients History and Physical, chart, labs and discussed the procedure including the risks, benefits and alternatives for the proposed anesthesia with the patient or authorized representative who has indicated his/her understanding and acceptance.   Dental advisory given  Plan Discussed with: CRNA and Anesthesiologist  Anesthesia Plan Comments:        Anesthesia Quick Evaluation

## 2018-08-14 DIAGNOSIS — L7632 Postprocedural hematoma of skin and subcutaneous tissue following other procedure: Secondary | ICD-10-CM | POA: Diagnosis not present

## 2018-08-14 LAB — GLUCOSE, CAPILLARY: Glucose-Capillary: 127 mg/dL — ABNORMAL HIGH (ref 70–99)

## 2018-08-14 NOTE — Progress Notes (Signed)
Suan Halter to be D/C'd  per MD order. Discussed with the patient and all questions fully answered.  VSS, Skin clean, dry and intact without evidence of skin break down, no evidence of skin tears noted.  IV catheter discontinued intact. Site without signs and symptoms of complications. Dressing and pressure applied.  An After Visit Summary was printed and given to the patient. Patient received prescription.  D/c education completed with patient/family including follow up instructions, medication list, d/c activities limitations if indicated, with other d/c instructions as indicated by MD - patient able to verbalize understanding, all questions fully answered.   Patient instructed to return to ED, call 911, or call MD for any changes in condition.   Patient to be escorted via [place], and D/C home via private auto.

## 2018-08-14 NOTE — Progress Notes (Signed)
Subjective: Interval History: none.. Comfortable.  Mild left lower quadrant abdominal soreness.  Objective: Vital signs in last 24 hours: Temp:  [97.4 F (36.3 C)-98.5 F (36.9 C)] 98.3 F (36.8 C) (09/14 0504) Pulse Rate:  [62-78] 62 (09/14 0504) Resp:  [12-20] 16 (09/14 0504) BP: (109-138)/(69-95) 117/80 (09/14 0504) SpO2:  [94 %-100 %] 99 % (09/14 0504) Weight:  [762 kg-113.4 kg] 113 kg (09/13 1433)  Intake/Output from previous day: 09/13 0701 - 09/14 0700 In: 1260 [P.O.:660; I.V.:600] Out: 2144 [Urine:2050; Drains:44; Blood:50] Intake/Output this shift: No intake/output data recorded.  Incision over the drain and intact.  Incision itself healing.  Lab Results: Recent Labs    08/11/18 2134 08/13/18 1454  WBC 6.1 5.7  HGB 13.6 12.2*  HCT 40.4 37.7*  PLT 265 215   BMET Recent Labs    08/11/18 2134 08/13/18 1454  NA 143  --   K 4.3  --   CL 108  --   CO2 27  --   GLUCOSE 86  --   BUN 20  --   CREATININE 1.43* 1.48*  CALCIUM 10.2  --     Studies/Results: Ct Abdomen Pelvis Wo Contrast  Result Date: 07/27/2018 CLINICAL DATA:  57 year old male with a history of anterior approach for lumbar spine fusion and abdominal wall hematoma. EXAM: CT ABDOMEN AND PELVIS WITHOUT CONTRAST TECHNIQUE: Multidetector CT imaging of the abdomen and pelvis was performed following the standard protocol without IV contrast. COMPARISON:  07/10/2018, ultrasound drainage 07/14/2018 FINDINGS: Lower chest: Atelectatic changes at the base of the left lung. Hepatobiliary: Unremarkable appearance liver Pancreas: Cyst of the pancreatic body was better characterized on the prior CT, though is unchanged in size. Spleen: Unremarkable spleen Adrenals/Urinary Tract: Unremarkable appearance of the adrenal glands. No evidence of hydronephrosis of the right or left kidney. No nephrolithiasis. Unremarkable course of the bilateral ureters. Unremarkable appearance of the urinary bladder. Redemonstration of a  left renal mass, which is incompletely characterized on the current CT giving the absence of contrast. Stomach/Bowel: Stomach is within normal limits. Appendix appears normal. No evidence of bowel wall thickening, distention, or inflammatory changes. Vascular/Lymphatic: Minimal atherosclerotic changes of the abdominal aorta and the iliac arteries. No lymphadenopathy Reproductive: Unremarkable appearance of the pelvic structures. Other: Hematoma of the left abdominal wall measures slightly smaller than the comparison CT, with prior dimension 7.3 cm by 13 cm. Dimensions today 4.4 cm x 11 cm. Pigtail drainage catheter remains centered within the fluid collection. Musculoskeletal: Surgical changes of the lumbar region again demonstrated with posterior lumbar interbody fusion with bilateral pedicle screw and rod fixation spanning L4-S1, as well as changes of anterior approach for discectomy/interbody spacer placement and fixation at L4-L5 and L5-S1. IMPRESSION: Pigtail drainage catheter remains centered within residual hematoma of the left anterior abdominal wall. The fluid collection is decreased in size since the prior CT. Redemonstration of left renal tumor, which is better characterized on the prior CT, performed with IV contrast. Aortic Atherosclerosis (ICD10-I70.0). 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 As was previously mention, a 1 year follow-up MRI or CT recommended. Electronically Signed   By: Corrie Mckusick D.O.   On: 07/27/2018 12:35   Ct Abdomen Pelvis W Contrast  Result Date: 08/11/2018 CLINICAL DATA:  Post anterior lumbar surgery, postoperative fluid collection, drainage tube removed on 07/27/2018, question fluid building up in abdomen again EXAM: CT ABDOMEN AND PELVIS WITH CONTRAST TECHNIQUE: Multidetector CT imaging of the abdomen and pelvis was performed using the standard protocol  following bolus administration of intravenous contrast. Sagittal and coronal MPR images  reconstructed from axial data set. CONTRAST:  118mL ISOVUE-300 IOPAMIDOL (ISOVUE-300) INJECTION 61% IV. No oral contrast. COMPARISON:  07/27/2018 FINDINGS: Lower chest: Subsegmental atelectasis LEFT lower lobe. Eventration LEFT diaphragm. Hepatobiliary: Contracted gallbladder.  Liver normal appearance. Pancreas: Normal appearance Spleen: Normal appearance.  Adjacent tiny splenules. Adrenals/Urinary Tract: Adrenal glands normal appearance. Mass identified at lateral aspect of upper to mid LEFT kidney demonstrating irregular peripheral enhancement and central low attenuation question necrosis, overall measuring 4.1 x 3.8 x 3.7 cm, consistent with a renal neoplasm. No additional renal masses. No renal vein involvement. Perinephric fat planes clear. No hydronephrosis, hydroureter or urinary tract calcification. Bladder obscured by beam hardening artifacts in pelvis. Stomach/Bowel: Normal appearing retrocecal appendix. Stomach and bowel loops otherwise normal appearance. Vascular/Lymphatic: Atherosclerotic calcifications aorta and iliac arteries without aneurysm. No adenopathy. Reproductive: Obscured by beam hardening artifacts Other: No free air or free fluid. Again identified large collection in the LEFT anterior abdominal wall of the mid abdomen and pelvis, contained by the superficial layer of the rectus fascia, 8.8 x 4.2 x 7.4 cm in size, may either represent a sterile or infected postoperative collection, slightly smaller than on the previous exam. No hernia. Musculoskeletal: BILATERAL hip prostheses with beam hardening artifacts in pelvis. Prior lower lumbar fusion procedures. No acute osseous findings. IMPRESSION: Large LEFT anterior abdominal wall fluid collection 8.8 x 4.2 x 7.4 cm, slightly smaller than on previous exam, consistent with either a sterile or infected postoperative collection. 4.1 x 3.8 x 3.7 cm diameter enhancing LEFT renal neoplasm again noted. No new abnormalities. Electronically Signed   By:  Lavonia Dana M.D.   On: 08/11/2018 23:07   Ir Radiologist Eval & Mgmt  Result Date: 07/27/2018 Please refer to notes tab for details about interventional procedure. (Op Note)  Anti-infectives: Anti-infectives (From admission, onward)   Start     Dose/Rate Route Frequency Ordered Stop   08/13/18 1103  ceFAZolin (ANCEF) 2-4 GM/100ML-% IVPB    Note to Pharmacy:  [facility]   : cabinet override      08/13/18 1103 08/13/18 1206   08/13/18 1100  ceFAZolin (ANCEF) IVPB 2g/100 mL premix     2 g 200 mL/hr over 30 Minutes Intravenous 30 min pre-op 08/13/18 1100 08/13/18 1206      Assessment/Plan: s/p Procedure(s): EVACUATION HEMATOMA LEFT ABDOMINAL WALL (Left) Comfortable.  Ready for discharge to home today.  Wife has been comfortable with emptying JP drain pigtail catheter placed by interventional radiology.  Discussed with the wife present.  The nurse will reassure her that she understands emptying of this.  I will see him in 3 weeks.  We will arrange for him to have his drain removed middle of next week in our office.  He has pain prescription arranged for him via Dr. Lynann Bologna and I confirmed this yesterday.   LOS: 0 days   Maricsa Sammons 08/14/2018, 8:38 AM

## 2018-08-16 ENCOUNTER — Encounter (HOSPITAL_COMMUNITY): Payer: Self-pay | Admitting: Vascular Surgery

## 2018-08-16 ENCOUNTER — Encounter: Payer: Self-pay | Admitting: *Deleted

## 2018-08-16 ENCOUNTER — Telehealth: Payer: Self-pay | Admitting: Vascular Surgery

## 2018-08-16 NOTE — Anesthesia Postprocedure Evaluation (Signed)
Anesthesia Post Note  Patient: Raymond Barrera  Procedure(s) Performed: EVACUATION HEMATOMA LEFT ABDOMINAL WALL (Left Abdomen)     Patient location during evaluation: PACU Anesthesia Type: General Level of consciousness: awake and alert Pain management: pain level controlled Vital Signs Assessment: post-procedure vital signs reviewed and stable Respiratory status: spontaneous breathing, nonlabored ventilation, respiratory function stable and patient connected to nasal cannula oxygen Cardiovascular status: blood pressure returned to baseline and stable Postop Assessment: no apparent nausea or vomiting Anesthetic complications: no    Last Vitals:  Vitals:   08/14/18 0855 08/14/18 1019  BP: 113/77 108/79  Pulse: 61 61  Resp: 14 16  Temp: 36.9 C 37.1 C  SpO2: 96% 96%    Last Pain:  Vitals:   08/14/18 1019  TempSrc: Oral  PainSc:                  [name]

## 2018-08-16 NOTE — Telephone Encounter (Signed)
sch appt spk to pt 08/19/18 330pm p/o PA

## 2018-08-17 NOTE — Discharge Summary (Signed)
[facility] Discharge Summary   Patient ID:  Raymond Barrera MRN: [id number] DOB/AGE: 59-May-1962 59 y.o.  Admit date: 08/13/2018 Discharge date: 08/14/2018 Date of Surgery: 08/13/2018 Surgeon: Juliann Mule): Early, Arvilla Meres, MD  Admission Diagnosis: LEFT ABDOMINAL WALL HEMATOMA S P ALIF  Discharge Diagnoses:  LEFT ABDOMINAL WALL HEMATOMA S P ALIF  Secondary Diagnoses: Past Medical History:  Diagnosis Date  . Anxiety    due to the stroke  . Arthritis   . Back pain    hx of buldging disc  . Diabetes mellitus without complication ([place])    takes Metformin daily  . High cholesterol    takes Zocor daily  . Hypertension    takes Benazepril and HCTZ  daily  . Joint pain   . Joint swelling   . Memory impairment    occassional - from stroke  . Myocardial infarction ([place]) 1987  . Pneumonia    hx of-80's  . PONV (postoperative nausea and vomiting)    took a while for him to wake up after previous anesthesia  . Shortness of breath dyspnea    do to pain  . Sleep apnea    never had a sleep study,but states Dr. Cindie Laroche says he has it  . Slurred speech   . Stroke ([place]) 08/2013   7 mini-strokes, last stroke 2017  . TIA (transient ischemic attack) 2014   x 7   . Urinary frequency     Procedure(s): EVACUATION HEMATOMA LEFT ABDOMINAL WALL  Discharged Condition: stable  HPI: Raymond Barrera is a 57 y.o. male known to me from prior anterior exposure for two-level fusion, L4-5 and L5-S1.  On 06/16/2018. He noted swelling in his left lower quadrant.  He had a further evaluation to include CT scan which showed a hematoma in the anterior abdominal wall above the level of the rectus muscle.  He had initial treatment with interventional radiology with a drain placement on 07/14/2018.  According to the notes 70 cc of fluid was removed and cultures of this were negative for aerobic and anaerobic growth.  He had his drain removed today and repeat CT scan.  CT scan does show that he  has a persistent probable liquefied hematoma above the level of the rectus muscle in his left lower quadrant.  Dr. Donnetta Hutching recommended evacuation of this.    Hospital Course:  Raymond Barrera is a 57 y.o. male is S/P  Procedure(s): EVACUATION HEMATOMA LEFT ABDOMINAL WALL   Comfortable.  Ready for discharge to home today.  Wife has been comfortable with emptying JP drain pigtail catheter placed by interventional radiology.  Discussed with the wife present.  The nurse will reassure her that she understands emptying of this.  I will see him in 3 weeks.  We will arrange for him to have his drain removed middle of next week in our office.  He has pain prescription arranged for him via Dr. Lynann Bologna and Dr. Donnetta Hutching confirmed this yesterday.  Significant Diagnostic Studies: CBC Lab Results  Component Value Date   WBC 5.7 08/13/2018   HGB 12.2 (L) 08/13/2018   HCT 37.7 (L) 08/13/2018   MCV 90.8 08/13/2018   PLT 215 08/13/2018    BMET    Component Value Date/Time   NA 143 08/11/2018 2134   K 4.3 08/11/2018 2134   CL 108 08/11/2018 2134   CO2 27 08/11/2018 2134   GLUCOSE 86 08/11/2018 2134   BUN 20 08/11/2018 2134   CREATININE 1.48 (H) 08/13/2018  1454   CALCIUM 10.2 08/11/2018 2134   GFRNONAA 50 (L) 08/13/2018 1454   GFRAA 58 (L) 08/13/2018 1454   COAG Lab Results  Component Value Date   INR 1.08 07/14/2018   INR 0.97 06/10/2018   INR 0.99 02/01/2017     Disposition:  Discharge to :Home Discharge Instructions    Call MD for:  redness, tenderness, or signs of infection (pain, swelling, bleeding, redness, odor or green/yellow discharge around incision site)   Complete by:  As directed    Call MD for:  severe or increased pain, loss or decreased feeling  in affected limb(s)   Complete by:  As directed    Call MD for:  temperature >100.5   Complete by:  As directed    Resume previous diet   Complete by:  As directed      Allergies as of 08/14/2018      Reactions   Poison Ivy  Extract [poison Ivy Extract] Itching, Rash      Medication List    STOP taking these medications   oxyCODONE 5 MG immediate release tablet Commonly known as:  Oxy IR/ROXICODONE     TAKE these medications   amLODipine 10 MG tablet Commonly known as:  NORVASC Take 10 mg by mouth daily.   aspirin 81 MG tablet Take 1 tablet (81 mg total) by mouth daily.   benazepril 20 MG tablet Commonly known as:  LOTENSIN Take 20 mg by mouth daily.   diazepam 5 MG tablet Commonly known as:  VALIUM Take 1 tablet (5 mg total) by mouth every 6 (six) hours as needed for muscle spasms.   Fish Oil 1000 MG Caps Take 1,000 mg by mouth daily.   folic acid 283 MCG tablet Commonly known as:  FOLVITE Take 400 mcg by mouth daily.   gabapentin 300 MG capsule Commonly known as:  NEURONTIN Take 300 mg by mouth 3 (three) times daily.   glucose 4 GM chewable tablet Chew 1 tablet by mouth as needed for low blood sugar.   HYDROcodone-acetaminophen 5-325 MG tablet Commonly known as:  NORCO/VICODIN Take 1-2 tablets by mouth every 4 (four) hours as needed for moderate pain.   IRON 27 240 (27 FE) MG tablet Generic drug:  ferrous gluconate Take 240 mg by mouth daily.   labetalol 200 MG tablet Commonly known as:  NORMODYNE Take 200 mg by mouth 2 (two) times daily.   metFORMIN 500 MG tablet Commonly known as:  GLUCOPHAGE Take 500 mg by mouth 2 (two) times daily with a meal.   multivitamin with minerals Tabs tablet Take 1 tablet by mouth daily.   naproxen 500 MG tablet Commonly known as:  NAPROSYN Take 500 mg by mouth 2 (two) times daily as needed for mild pain.   simvastatin 40 MG tablet Commonly known as:  ZOCOR Take 40 mg by mouth daily.   vitamin C 1000 MG tablet Take 1,000 mg by mouth daily.   vitamin E 200 UNIT capsule Take 200 Units by mouth daily.     ASK your doctor about these medications   chlorhexidine 4 % external liquid Commonly known as:  HIBICLENS Apply 60 mLs (4  application total) topically once for 1 dose. Ask about: Should I take this medication?   chlorhexidine 4 % external liquid Commonly known as:  HIBICLENS Apply 60 mLs (4 application total) topically once for 1 dose. Ask about: Should I take this medication?   chlorhexidine 4 % external liquid Commonly known as:  HIBICLENS Apply 60 mLs (4 application total) topically once for 1 dose. Ask about: Should I take this medication?   chlorhexidine 4 % external liquid Commonly known as:  HIBICLENS Apply 60 mLs (4 application total) topically once for 1 dose. Ask about: Should I take this medication?      Verbal and written Discharge instructions given to the patient. Wound care per Discharge AVS   Signed: Roxy Horseman 08/17/2018, 2:00 PM

## 2018-08-18 ENCOUNTER — Ambulatory Visit (INDEPENDENT_AMBULATORY_CARE_PROVIDER_SITE_OTHER): Payer: Self-pay | Admitting: Physician Assistant

## 2018-08-18 VITALS — BP 132/100 | HR 104 | Temp 97.2°F | Resp 18

## 2018-08-18 DIAGNOSIS — S301XXA Contusion of abdominal wall, initial encounter: Secondary | ICD-10-CM

## 2018-08-18 DIAGNOSIS — Z48812 Encounter for surgical aftercare following surgery on the circulatory system: Secondary | ICD-10-CM

## 2018-08-18 LAB — AEROBIC/ANAEROBIC CULTURE (SURGICAL/DEEP WOUND)

## 2018-08-18 LAB — AEROBIC/ANAEROBIC CULTURE W GRAM STAIN (SURGICAL/DEEP WOUND): Culture: NO GROWTH

## 2018-08-18 NOTE — Progress Notes (Signed)
    Postoperative Access Visit   History of Present Illness   CLIMMIE BUELOW is a 57 y.o. year old male who presents for postoperative follow-up status post evacuation of left lower abdomen hematoma after anterior exposure by Dr. Donnetta Hutching.  The patient was discharged with a JP drain in his left lower abdomen.  The patient and his wife are emotional during her visit today as they were just evicted from their home this morning and are now essentially homeless.  He has not noticed any significant draining from abdominal wall and has not required emptying of JP drain.  He still has some soreness around his incision however this is improving.  He has a follow-up with Dr. Lynann Bologna in the coming weeks.  Physical Examination   Vitals:   08/18/18 1512 08/18/18 1517  BP: (!) 137/104 (!) 132/100  Pulse: (!) 104   Resp: 18   Temp: (!) 97.2 F (36.2 C)   TempSrc: Oral   SpO2: 98%    There is no height or weight on file to calculate BMI.  Abdomen Incision is healed, some soreness to deep palpation however no palpable fluid collection or firm hematoma; no drainage or further bleeding after removal of JP drain    Medical Decision Making   MANUELITO POAGE is a 57 y.o. year old male who presents s/p evacuation of abdominal wall hematoma related to prior anterior spine exposure   JP drain removed in office today; no bleeding noted from JP drain exit site  Patient will follow up with Dr. Lynann Bologna for pain management  He also has a follow up with Dr. Donnetta Hutching next month   Dagoberto Ligas PA-C [facility] [place] Office: [phone]

## 2018-08-31 ENCOUNTER — Encounter: Payer: Medicaid Other | Admitting: Vascular Surgery

## 2018-09-14 ENCOUNTER — Ambulatory Visit: Payer: Medicaid Other | Admitting: Cardiovascular Disease

## 2018-09-28 ENCOUNTER — Encounter: Payer: Medicaid Other | Admitting: Vascular Surgery

## 2018-10-14 ENCOUNTER — Encounter (HOSPITAL_COMMUNITY): Payer: Self-pay

## 2018-10-15 ENCOUNTER — Encounter (HOSPITAL_COMMUNITY): Payer: Self-pay

## 2018-10-15 ENCOUNTER — Emergency Department (HOSPITAL_COMMUNITY)
Admission: EM | Admit: 2018-10-15 | Discharge: 2018-10-15 | Disposition: A | Discharge status: Home / Self Care | Payer: Medicaid Other | Attending: Emergency Medicine | Admitting: Emergency Medicine

## 2018-10-15 DIAGNOSIS — E7800 Pure hypercholesterolemia, unspecified: Secondary | ICD-10-CM | POA: Diagnosis not present

## 2018-10-15 DIAGNOSIS — Z7982 Long term (current) use of aspirin: Secondary | ICD-10-CM | POA: Insufficient documentation

## 2018-10-15 DIAGNOSIS — R1032 Left lower quadrant pain: Secondary | ICD-10-CM

## 2018-10-15 DIAGNOSIS — I10 Essential (primary) hypertension: Secondary | ICD-10-CM | POA: Diagnosis not present

## 2018-10-15 DIAGNOSIS — I252 Old myocardial infarction: Secondary | ICD-10-CM | POA: Diagnosis not present

## 2018-10-15 DIAGNOSIS — Z79899 Other long term (current) drug therapy: Secondary | ICD-10-CM | POA: Diagnosis not present

## 2018-10-15 DIAGNOSIS — Z8673 Personal history of transient ischemic attack (TIA), and cerebral infarction without residual deficits: Secondary | ICD-10-CM | POA: Insufficient documentation

## 2018-10-15 DIAGNOSIS — M545 Low back pain: Secondary | ICD-10-CM | POA: Diagnosis present

## 2018-10-15 DIAGNOSIS — E119 Type 2 diabetes mellitus without complications: Secondary | ICD-10-CM | POA: Insufficient documentation

## 2018-10-15 DIAGNOSIS — E785 Hyperlipidemia, unspecified: Secondary | ICD-10-CM | POA: Diagnosis not present

## 2018-10-15 DIAGNOSIS — Z7984 Long term (current) use of oral hypoglycemic drugs: Secondary | ICD-10-CM | POA: Insufficient documentation

## 2018-10-15 DIAGNOSIS — F17210 Nicotine dependence, cigarettes, uncomplicated: Secondary | ICD-10-CM | POA: Diagnosis not present

## 2018-10-15 DIAGNOSIS — M62830 Muscle spasm of back: Secondary | ICD-10-CM

## 2018-10-15 LAB — CBC WITH DIFFERENTIAL/PLATELET
ABS IMMATURE GRANULOCYTES: 0.01 10*3/uL (ref 0.00–0.07)
BASOS PCT: 0 %
Basophils Absolute: 0 10*3/uL (ref 0.0–0.1)
EOS ABS: 0.1 10*3/uL (ref 0.0–0.5)
Eosinophils Relative: 2 %
HCT: 40.9 % (ref 39.0–52.0)
Hemoglobin: 12.9 g/dL — ABNORMAL LOW (ref 13.0–17.0)
IMMATURE GRANULOCYTES: 0 %
Lymphocytes Relative: 20 %
Lymphs Abs: 1.4 10*3/uL (ref 0.7–4.0)
MCH: 28.6 pg (ref 26.0–34.0)
MCHC: 31.5 g/dL (ref 30.0–36.0)
MCV: 90.7 fL (ref 80.0–100.0)
Monocytes Absolute: 0.5 10*3/uL (ref 0.1–1.0)
Monocytes Relative: 7 %
NEUTROS ABS: 4.7 10*3/uL (ref 1.7–7.7)
NEUTROS PCT: 71 %
PLATELETS: 216 10*3/uL (ref 150–400)
RBC: 4.51 MIL/uL (ref 4.22–5.81)
RDW: 14 % (ref 11.5–15.5)
WBC: 6.7 10*3/uL (ref 4.0–10.5)
nRBC: 0 % (ref 0.0–0.2)

## 2018-10-15 LAB — BASIC METABOLIC PANEL
ANION GAP: 5 (ref 5–15)
BUN: 14 mg/dL (ref 6–20)
CALCIUM: 9.9 mg/dL (ref 8.9–10.3)
CO2: 30 mmol/L (ref 22–32)
Chloride: 107 mmol/L (ref 98–111)
Creatinine, Ser: 1.22 mg/dL (ref 0.61–1.24)
GFR calc Af Amer: >60 mL/min (ref >60)
Glucose, Bld: 134 mg/dL — ABNORMAL HIGH (ref 70–99)
POTASSIUM: 4.8 mmol/L (ref 3.5–5.1)
Sodium: 142 mmol/L (ref 135–145)

## 2018-10-15 LAB — URINALYSIS, ROUTINE W REFLEX MICROSCOPIC
BACTERIA UA: NONE SEEN
Bilirubin Urine: NEGATIVE
GLUCOSE, UA: NEGATIVE mg/dL
Hgb urine dipstick: NEGATIVE
Ketones, ur: NEGATIVE mg/dL
Leukocytes, UA: NEGATIVE
Nitrite: NEGATIVE
PROTEIN: 30 mg/dL — AB
Specific Gravity, Urine: 1.023 (ref 1.005–1.030)
pH: 5 (ref 5.0–8.0)

## 2018-10-15 MED ORDER — HYDROCODONE-ACETAMINOPHEN 5-325 MG PO TABS
2.0000 | ORAL_TABLET | Freq: Once | ORAL | Status: AC
  Administered 2018-10-15: 2 via ORAL
  Filled 2018-10-15: qty 2

## 2018-10-15 MED ORDER — HYDROCODONE-ACETAMINOPHEN 5-325 MG PO TABS: 1.0000 | ORAL_TABLET | ORAL | 0 refills | Status: DC | PRN

## 2018-10-15 MED ORDER — ONDANSETRON HCL 4 MG PO TABS
4.0000 mg | ORAL_TABLET | Freq: Once | ORAL | Status: AC
  Administered 2018-10-15: 4 mg via ORAL
  Filled 2018-10-15: qty 1

## 2018-10-15 MED ORDER — TRAMADOL HCL 50 MG PO TABS: ORAL_TABLET | ORAL | 0 refills | Status: DC

## 2018-10-15 MED ORDER — DIAZEPAM 5 MG PO TABS
10.0000 mg | ORAL_TABLET | Freq: Once | ORAL | Status: AC
  Administered 2018-10-15: 10 mg via ORAL
  Filled 2018-10-15: qty 2

## 2018-10-15 MED ORDER — DIAZEPAM 5 MG PO TABS: ORAL_TABLET | ORAL | 0 refills | Status: DC

## 2018-10-15 NOTE — ED Triage Notes (Signed)
Pt reports that he had back sx in July. Pt reports that he picked up a TV 3 days ago and injured lower back

## 2018-10-15 NOTE — Discharge Instructions (Addendum)
Your blood pressure is elevated, but your other vital signs are within normal limits.  Your oxygen level is 100% on room air.  Your urine test, complete blood count, and chemistry test are all negative for acute or emergent changes.  Your examination suggest possible muscle strain with spasm involving your lower back, and aggravation of your ongoing left lower abdomen pain.  Please use the muscle relaxant 3 times daily. This medication may cause drowsiness. Please do not drink, drive, or participate in activity that requires concentration while taking this medication.  Please use Tylenol extra strength every 4 hours for mild to moderate pain, use the narcotic medication (Ultram) for more severe pain.This medication may cause drowsiness. Please do not drink, drive, or participate in activity that requires concentration while taking this medication.  Please your surgeon for assistance with pain management and re-evaluation of your back strain.

## 2018-10-15 NOTE — ED Provider Notes (Signed)
[facility] EMERGENCY DEPARTMENT Provider Note   CSN: [id number] Arrival date & time: 10/15/18  1538     History   Chief Complaint Chief Complaint  Patient presents with  . Back Pain    HPI Raymond Barrera is a 57 y.o. male.  Patient is a 57 year old male who presents to the emergency department with a complaint of back pain.  Patient has a history of arthritis.  Degenerative disc disease requiring disc surgery from an anterior approach of the L4-L5 and L5S1 region.  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  3 days ago the patient was putting some materials in the trash.  He felt a pop and felt a pull in his lower back.  On the left greater than on the right and he has been having increasing pain in that area since that time.  At that time he had no loss of control of bowel or bladder.  He did not have a fall.  He has not had any recent injury or trauma to the area.  It is of note that he continues to have some abdominal pain.  However he has had 2 drains put in his left lower abdomen and also surgery to remove hematoma, and anterior approach for disc surgery.  Spouse also reports temperature elevations over the last 3 days.  The temperature max was 101.  There is been some cough, and sneezing, no productive cough.  No hemoptysis reported.  No hematuria reported.  No dysuria reported.  No vomiting, and no unusual diarrhea reported.  He presents now for assistance with his back pain.  The history is provided by the patient and the spouse.    Past Medical History:  Diagnosis Date  . Anxiety    due to the stroke  . Arthritis   . Back pain    hx of buldging disc  . Diabetes mellitus without complication ([place])    takes Metformin daily  . High cholesterol    takes Zocor daily  .  Hypertension    takes Benazepril and HCTZ  daily  . Joint pain   . Joint swelling   . Memory impairment    occassional - from stroke  . Myocardial infarction ([place]) 1987  . Pneumonia    hx of-80's  . PONV (postoperative nausea and vomiting)    took a while for him to wake up after previous anesthesia  . Shortness of breath dyspnea    do to pain  . Sleep apnea    never had a sleep study,but states Dr. Cindie Laroche says he has it  . Slurred speech   . Stroke ([place]) 08/2013   7 mini-strokes, last stroke 2017  . TIA (transient ischemic attack) 2014   x 7   . Urinary frequency     Patient Active Problem List   Diagnosis Date Noted  . Abdominal hematoma 08/13/2018  . Radiculopathy 06/16/2018  . Acute CVA (cerebrovascular accident) ([place]) 02/01/2017  . Diabetes mellitus ([place]) 02/01/2017  . HTN (hypertension) 02/01/2017  . Primary osteoarthritis of left hip 01/08/2016  . DJD (degenerative joint disease) 03/20/2015  . Primary osteoarthritis of right hip 02/26/2015  . Lumbar spondylosis 10/17/2014  . Lumbago 07/25/2014  . Abnormality of gait 07/25/2014  . Difficulty in walking(719.7) 07/25/2014  . TIA (transient ischemic attack) 03/09/2013  . Cocaine abuse ([place]) 03/09/2013  . Accelerated  hypertension 03/09/2013  . Hyperlipidemia 03/09/2013  . Current smoker 03/09/2013    Past Surgical History:  Procedure Laterality Date  . ABDOMINAL EXPOSURE N/A 06/16/2018   Procedure: ABDOMINAL EXPOSURE;  Surgeon: Rosetta Posner, MD;  Location: [place];  Service: Vascular;  Laterality: N/A;  . ANKLE SURGERY  2008   left ankle-otif-[facility]  . ANTERIOR LUMBAR FUSION Bilateral 06/16/2018   Procedure: LUMBAR 4-5 LUMBAR 5-SACRUM 1 ANTERIOR LUMBAR INTERBODY FUSION WITH INSTRUMENTATION AND ALLOGRAFT;  Surgeon: Phylliss Bob, MD;  Location: [place];  Service: Orthopedics;  Laterality: Bilateral;  . BACK SURGERY    . HEMATOMA EVACUATION Left 08/13/2018   Procedure: EVACUATION HEMATOMA LEFT ABDOMINAL WALL;   Surgeon: Rosetta Posner, MD;  [facility];  Service: Vascular;  Laterality: Left;  . IR RADIOLOGIST EVAL & MGMT  07/27/2018  . IR US GUIDE BX ASP/DRAIN  07/14/2018  . JOINT REPLACEMENT     both hips replaced   . LUMBAR LAMINECTOMY/DECOMPRESSION MICRODISCECTOMY Right 10/17/2014   Procedure: LUMBAR LAMINECTOMY/DECOMPRESSION MICRODISCECTOMY 2 LEVELS;  Surgeon: [name], MD;  Location: [place] [facility];  Service: Neurosurgery;  Laterality: Right;  Right L45 L5S1 laminectomy and foraminotomy  . MASS EXCISION  09/13/2012   Procedure: EXCISION MASS;  Surgeon: Jamesetta So, MD;  [facility]: [facility];  Service: General;  Laterality: N/A;  . TONSILLECTOMY    . TOTAL HIP ARTHROPLASTY Right 03/20/2015  . TOTAL HIP ARTHROPLASTY Right 03/20/2015   Procedure: TOTAL HIP ARTHROPLASTY ANTERIOR APPROACH;  Surgeon: Renette Butters, MD;  Location: [place];  Service: Orthopedics;  Laterality: Right;  . TOTAL HIP ARTHROPLASTY Left 01/08/2016   Procedure: TOTAL HIP ARTHROPLASTY ANTERIOR APPROACH;  Surgeon: Renette Butters, MD;  Location: [place];  Service: Orthopedics;  Laterality: Left;        Home Medications    Prior to Admission medications   Medication Sig Start Date End Date Taking? Authorizing Provider  amLODipine (NORVASC) 10 MG tablet Take 10 mg by mouth daily.    [provider]  Ascorbic Acid (VITAMIN C) 1000 MG tablet Take 1,000 mg by mouth daily.    [provider]  aspirin 81 MG tablet Take 1 tablet (81 mg total) by mouth daily. 06/14/18   Croitoru, Mihai, MD  benazepril (LOTENSIN) 20 MG tablet Take 20 mg by mouth daily.    [provider]  diazepam (VALIUM) 5 MG tablet Take 1 tablet (5 mg total) by mouth every 6 (six) hours as needed for muscle spasms. 06/19/18   Gary Fleet, PA-C  ferrous gluconate (IRON 27) 240 (27 FE) MG tablet Take 240 mg by mouth daily.    [provider]  folic acid (FOLVITE) 010 MCG tablet Take 400 mcg by mouth daily.    [provider]  gabapentin (NEURONTIN) 300 MG capsule Take 300 mg by mouth 3 (three) times daily.    [provider]  glucose 4 GM chewable tablet Chew 1 tablet by mouth as needed for low blood sugar.    [provider]  HYDROcodone-acetaminophen (NORCO) 5-325 MG tablet Take 1-2 tablets by mouth every 4 (four) hours as needed for moderate pain. Patient not taking: Reported on 08/18/2018 08/13/18   Ulyses Amor, PA-C  labetalol (NORMODYNE) 200 MG tablet Take 200 mg by mouth 2 (two) times daily.    [provider]  metFORMIN (GLUCOPHAGE) 500 MG tablet Take 500 mg by mouth 2 (two) times daily with a meal.    [provider]  Multiple  Vitamin (MULTIVITAMIN WITH MINERALS) TABS tablet Take 1 tablet by mouth daily.    [provider]  naproxen (NAPROSYN) 500 MG tablet Take 500 mg by mouth 2 (two) times daily as needed for mild pain.    [provider]  Omega-3 Fatty Acids (FISH OIL) 1000 MG CAPS Take 1,000 mg by mouth daily.    [provider]  simvastatin (ZOCOR) 40 MG tablet Take 40 mg by mouth daily.    [provider]  vitamin E 200 UNIT capsule Take 200 Units by mouth daily.    [provider]    Family History Family History  Problem Relation Age of Onset  . Heart disease Father     Social History Social History   Tobacco Use  . Smoking status: Current Every Day Smoker    Packs/day: 0.25    Years: 47.00    Pack years: 11.75    Types: Cigarettes  . Smokeless tobacco: Never Used  . Tobacco comment: Less thant 1/4 of a pack.   Substance Use Topics  . Alcohol use: No    Comment: quit 2012  . Drug use: Not Currently    Types: Cocaine    Comment: many yrs ago., last time- late 2016     Allergies   Poison ivy extract [poison ivy extract]   Review of Systems Review of Systems  Constitutional: Positive for fever. Negative for activity change.       All ROS Neg except as noted in HPI  HENT: Negative  for nosebleeds.   Eyes: Negative for photophobia and discharge.  Respiratory: Negative for cough, shortness of breath and wheezing.   Cardiovascular: Negative for chest pain and palpitations.  Gastrointestinal: Positive for abdominal pain. Negative for blood in stool, diarrhea, nausea and vomiting.  Genitourinary: Negative for dysuria, frequency and hematuria.  Musculoskeletal: Positive for back pain. Negative for arthralgias and neck pain.  Skin: Negative.   Neurological: Negative for dizziness, seizures and speech difficulty.  Psychiatric/Behavioral: Negative for confusion and hallucinations.     Physical Exam Updated Vital Signs BP (!) 150/102 (BP Location: Left Arm)   Pulse 73   Temp (!) 97.5 F (36.4 C) (Oral)   Resp 16   Ht 6\' 6"  (1.981 m)   Wt 111.1 kg   SpO2 100%   BMI 28.31 kg/m   Physical Exam  Constitutional: He is oriented to person, place, and time. He appears well-developed and well-nourished. No distress.  HENT:  Head: Normocephalic and atraumatic.  Right Ear: External ear normal.  Left Ear: External ear normal.  Eyes: Conjunctivae are normal. Right eye exhibits no discharge. Left eye exhibits no discharge. No scleral icterus.  Neck: Neck supple. No tracheal deviation present.  Cardiovascular: Normal rate, regular rhythm and intact distal pulses.  Pulmonary/Chest: Effort normal and breath sounds normal. No stridor. No respiratory distress. He has no wheezes. He has no rales.  Abdominal: Soft. Bowel sounds are normal. He exhibits no distension. There is no tenderness. There is no rebound and no guarding.  Left lower abdomen discomfort to palpation.  Patient states this is not new.  States he has had this problem since the operation and drains have been in this area.  Musculoskeletal: He exhibits no edema or tenderness.       Lumbar back: He exhibits decreased range of motion, pain and spasm.       Back:  For there is no palpable step-off of the cervical,  thoracic, or lumbar spine.  There are  no hot areas appreciated.  There is pain to palpation of the lower lumbar area.  There is pain in the paraspinal area of the lumbar region with change of position or attempted range of motion.  Neurological: He is alert and oriented to person, place, and time. He has normal strength. No cranial nerve deficit (no facial droop, extraocular movements intact, no slurred speech) or sensory deficit. He exhibits normal muscle tone. He displays no seizure activity. Coordination normal.  There is no numbness or sensory deficit in the saddle area.  There is some weakness of the left lower extremity.  Patient states he has had this problem since his extensive back surgeries.  Skin: Skin is warm and dry. No rash noted.  Psychiatric: He has a normal mood and affect.  Nursing note and vitals reviewed.    ED Treatments / Results  Labs (all labs ordered are listed, but only abnormal results are displayed) Labs Reviewed - No data to display  EKG None  Radiology No results found.  Procedures Procedures (including critical care time)  Medications Ordered in ED Medications - No data to display   Initial Impression / Assessment and Plan / ED Course  I have reviewed the triage vital signs and the nursing notes.  Pertinent labs & imaging results that were available during my care of the patient were reviewed by me and considered in my medical decision making (see chart for details).       Final Clinical Impressions(s) / ED Diagnoses MDM  Blood pressure elevated.  Other vital signs are within normal limits.  Pulse oximetry is 100% on room air.  Within normal limits by my interpretation.  Patient treated in the emergency department with oral Valium and Norco for pain and spasm of the lower back and also pain of the left lower abdomen.  Complete blood count is well within normal limits. Basic metabolic panel is nonacute. Urine analysis nonacute.  No evidence  of urinary tract infection or kidney stone.  The examination suggests muscle strain.  I suspect that the patient may have aggravated the area of previous back surgery.  Patient continues to have left lower abdomen pain.  This pain is been ongoing since the insertion of the drains and also removal of the seroma.  I have asked the patient to follow-up with Dr. early to have this rechecked.  Prescription for Norco given to the patient to use for discomfort over the weekend.    Final diagnoses:  Muscle spasm of back  Left lower quadrant abdominal pain    ED Discharge Orders         Ordered    diazepam (VALIUM) 5 MG tablet     10/15/18 1921    traMADol (ULTRAM) 50 MG tablet  Status:  Discontinued     10/15/18 1921    HYDROcodone-acetaminophen (NORCO/VICODIN) 5-325 MG tablet  Every 4 hours PRN     10/15/18 1937           Lily Kocher, PA-C 10/15/18 2054    Dorie Rank, MD 10/15/18 [phone]

## 2018-10-16 ENCOUNTER — Encounter (HOSPITAL_COMMUNITY): Payer: Self-pay | Admitting: Emergency Medicine

## 2018-10-16 ENCOUNTER — Emergency Department (HOSPITAL_COMMUNITY)
Admission: EM | Admit: 2018-10-16 | Discharge: 2018-10-17 | Disposition: A | Discharge status: Home / Self Care | Payer: Medicaid Other | Attending: Emergency Medicine | Admitting: Emergency Medicine

## 2018-10-16 ENCOUNTER — Other Ambulatory Visit: Payer: Self-pay

## 2018-10-16 ENCOUNTER — Emergency Department (HOSPITAL_COMMUNITY): Payer: Medicaid Other

## 2018-10-16 DIAGNOSIS — F17210 Nicotine dependence, cigarettes, uncomplicated: Secondary | ICD-10-CM | POA: Insufficient documentation

## 2018-10-16 DIAGNOSIS — E119 Type 2 diabetes mellitus without complications: Secondary | ICD-10-CM | POA: Insufficient documentation

## 2018-10-16 DIAGNOSIS — Z7984 Long term (current) use of oral hypoglycemic drugs: Secondary | ICD-10-CM | POA: Insufficient documentation

## 2018-10-16 DIAGNOSIS — Z7982 Long term (current) use of aspirin: Secondary | ICD-10-CM | POA: Diagnosis not present

## 2018-10-16 DIAGNOSIS — I10 Essential (primary) hypertension: Secondary | ICD-10-CM | POA: Insufficient documentation

## 2018-10-16 DIAGNOSIS — Z79899 Other long term (current) drug therapy: Secondary | ICD-10-CM | POA: Diagnosis not present

## 2018-10-16 DIAGNOSIS — M545 Low back pain, unspecified: Secondary | ICD-10-CM

## 2018-10-16 DIAGNOSIS — Z96643 Presence of artificial hip joint, bilateral: Secondary | ICD-10-CM | POA: Insufficient documentation

## 2018-10-16 DIAGNOSIS — R1032 Left lower quadrant pain: Secondary | ICD-10-CM

## 2018-10-16 MED ORDER — ONDANSETRON HCL 4 MG/2ML IJ SOLN
INTRAMUSCULAR | Status: AC
  Filled 2018-10-16: qty 2

## 2018-10-16 MED ORDER — IOPAMIDOL (ISOVUE-300) INJECTION 61%
100.0000 mL | Freq: Once | INTRAVENOUS | Status: AC | PRN
  Administered 2018-10-16: 100 mL via INTRAVENOUS

## 2018-10-16 MED ORDER — ONDANSETRON HCL 4 MG/2ML IJ SOLN
4.0000 mg | Freq: Once | INTRAMUSCULAR | Status: AC
  Administered 2018-10-16: 4 mg via INTRAVENOUS

## 2018-10-16 NOTE — ED Triage Notes (Signed)
Patient being seen for lower back pain, seen in ED yesterday, given prescriptions for pain medicine and muscle relaxer, didn't get scripts filled due to not able to pay for them, pain worse since yesterday. Takes labetalol twice day, hasn't taken evening dose, B/P currently elevated.

## 2018-10-16 NOTE — ED Provider Notes (Signed)
[facility] EMERGENCY DEPARTMENT Provider Note   CSN: [id number] Arrival date & time: 10/16/18  2111     History   Chief Complaint Chief Complaint  Patient presents with  . Back Pain    HPI Raymond Barrera is a 57 y.o. male.  HPI Patient presents with low back pain and abdominal pain.  Began 5 days ago after he bent over to pick something up and felt a pop in his back.  States he stood back up and felt another pop.  Has had continued pain.  Severe.  Seen in the ER yesterday and lab work done reassuring.  However he has had previous back surgery complicated by intra-abdominal hematoma that required to IR drainages and an open drain. Past Medical History:  Diagnosis Date  . Anxiety    due to the stroke  . Arthritis   . Back pain    hx of buldging disc  . Diabetes mellitus without complication ([place])    takes Metformin daily  . High cholesterol    takes Zocor daily  . Hypertension    takes Benazepril and HCTZ  daily  . Joint pain   . Joint swelling   . Memory impairment    occassional - from stroke  . Myocardial infarction ([place]) 1987  . Pneumonia    hx of-80's  . PONV (postoperative nausea and vomiting)    took a while for him to wake up after previous anesthesia  . Shortness of breath dyspnea    do to pain  . Sleep apnea    never had a sleep study,but states Dr. Cindie Laroche says he has it  . Slurred speech   . Stroke ([place]) 08/2013   7 mini-strokes, last stroke 2017  . TIA (transient ischemic attack) 2014   x 7   . Urinary frequency     Patient Active Problem List   Diagnosis Date Noted  . Abdominal hematoma 08/13/2018  . Radiculopathy 06/16/2018  . Acute CVA (cerebrovascular accident) ([place]) 02/01/2017  . Diabetes mellitus ([place]) 02/01/2017  . HTN (hypertension) 02/01/2017  . Primary osteoarthritis of left hip 01/08/2016  . DJD (degenerative joint disease) 03/20/2015  . Primary osteoarthritis of right hip 02/26/2015  . Lumbar spondylosis 10/17/2014  . Lumbago  07/25/2014  . Abnormality of gait 07/25/2014  . Difficulty in walking(719.7) 07/25/2014  . TIA (transient ischemic attack) 03/09/2013  . Cocaine abuse ([place]) 03/09/2013  . Accelerated hypertension 03/09/2013  . Hyperlipidemia 03/09/2013  . Current smoker 03/09/2013    Past Surgical History:  Procedure Laterality Date  . ABDOMINAL EXPOSURE N/A 06/16/2018   Procedure: ABDOMINAL EXPOSURE;  Surgeon: Rosetta Posner, MD;  Location: [place];  Service: Vascular;  Laterality: N/A;  . ANKLE SURGERY  2008   left ankle-otif-[facility]  . ANTERIOR LUMBAR FUSION Bilateral 06/16/2018   Procedure: LUMBAR 4-5 LUMBAR 5-SACRUM 1 ANTERIOR LUMBAR INTERBODY FUSION WITH INSTRUMENTATION AND ALLOGRAFT;  Surgeon: Phylliss Bob, MD;  Location: [place];  Service: Orthopedics;  Laterality: Bilateral;  . BACK SURGERY    . HEMATOMA EVACUATION Left 08/13/2018   Procedure: EVACUATION HEMATOMA LEFT ABDOMINAL WALL;  Surgeon: Rosetta Posner, MD;  [facility];  Service: Vascular;  Laterality: Left;  . IR RADIOLOGIST EVAL & MGMT  07/27/2018  . IR US GUIDE BX ASP/DRAIN  07/14/2018  . JOINT REPLACEMENT     both hips replaced   . LUMBAR LAMINECTOMY/DECOMPRESSION MICRODISCECTOMY Right 10/17/2014   Procedure: LUMBAR LAMINECTOMY/DECOMPRESSION MICRODISCECTOMY 2 LEVELS;  Surgeon: [name], MD;  Location:  [place] [facility];  Service: Neurosurgery;  Laterality: Right;  Right L45 L5S1 laminectomy and foraminotomy  . MASS EXCISION  09/13/2012   Procedure: EXCISION MASS;  Surgeon: Jamesetta So, MD;  [facility]: [facility];  Service: General;  Laterality: N/A;  . TONSILLECTOMY    . TOTAL HIP ARTHROPLASTY Right 03/20/2015  . TOTAL HIP ARTHROPLASTY Right 03/20/2015   Procedure: TOTAL HIP ARTHROPLASTY ANTERIOR APPROACH;  Surgeon: Renette Butters, MD;  Location: [place];  Service: Orthopedics;  Laterality: Right;  . TOTAL HIP ARTHROPLASTY Left 01/08/2016   Procedure: TOTAL HIP ARTHROPLASTY ANTERIOR APPROACH;  Surgeon: Renette Butters, MD;  Location: [place];  Service: Orthopedics;  Laterality: Left;        Home Medications    Prior to Admission medications   Medication Sig Start Date End Date Taking? Authorizing Provider  amLODipine (NORVASC) 10 MG tablet Take 10 mg by mouth daily.   Yes [provider]  Ascorbic Acid (VITAMIN C) 1000 MG tablet Take 1,000 mg by mouth daily.   Yes [provider]  aspirin 81 MG tablet Take 1 tablet (81 mg total) by mouth daily. 06/14/18  Yes Croitoru, Mihai, MD  benazepril (LOTENSIN) 20 MG tablet Take 20 mg by mouth daily.   Yes [provider]  diazepam (VALIUM) 5 MG tablet 1 po tid for spasm pain 10/15/18  Yes Lily Kocher, PA-C  ferrous gluconate (IRON 27) 240 (27 FE) MG tablet Take 240 mg by mouth daily.   Yes [provider]  folic acid (FOLVITE) 401 MCG tablet Take 400 mcg by mouth daily.   Yes [provider]  gabapentin (NEURONTIN) 300 MG capsule Take 300 mg by mouth 3 (three) times daily.   Yes [provider]  glucose 4 GM chewable tablet Chew 1 tablet by mouth as needed for low blood sugar.   Yes [provider]  labetalol (NORMODYNE) 200 MG tablet Take 200 mg by mouth 2 (two) times daily.   Yes [provider]  metFORMIN (GLUCOPHAGE) 500 MG tablet Take 500 mg by mouth 2 (two) times daily with a meal.   Yes [provider]  methocarbamol (ROBAXIN) 500 MG tablet Take 500 mg by mouth every 6 (six) hours as needed for muscle spasms.   Yes [provider]  Multiple Vitamin (MULTIVITAMIN WITH MINERALS) TABS tablet Take 1 tablet by mouth daily.   Yes [provider]  naproxen (NAPROSYN) 500 MG tablet Take 500 mg by mouth 2 (two) times daily as needed for mild pain.   Yes [provider]  Omega-3 Fatty Acids (FISH OIL) 1000 MG CAPS Take 1,000 mg by mouth daily.   Yes [provider]  oxyCODONE-acetaminophen (PERCOCET) 10-325 MG tablet Take 1-2 tablets by mouth every 6 (six) hours as needed  for pain.  09/28/18  Yes [provider]  simvastatin (ZOCOR) 40 MG tablet Take 40 mg by mouth daily.   Yes [provider]  vitamin E 200 UNIT capsule Take 200 Units by mouth daily.   Yes [provider]  HYDROcodone-acetaminophen (NORCO/VICODIN) 5-325 MG tablet Take 1 tablet by mouth every 4 (four) hours as needed. 10/15/18   Lily Kocher, PA-C    Family History Family History  Problem Relation Age of Onset  . Heart disease Father     Social History Social History   Tobacco Use  . Smoking status: Current Every Day Smoker    Packs/day: 0.25    Years: 47.00    Pack  years: 11.75    Types: Cigarettes  . Smokeless tobacco: Never Used  . Tobacco comment: Less thant 1/4 of a pack.   Substance Use Topics  . Alcohol use: No    Comment: quit 2012  . Drug use: Not Currently    Types: Cocaine    Comment: many yrs ago., last time- late 2016     Allergies   Poison ivy extract [poison ivy extract]   Review of Systems Review of Systems  Constitutional: Positive for appetite change.  HENT: Negative for congestion.   Respiratory: Negative for shortness of breath.   Cardiovascular: Negative for chest pain.  Gastrointestinal: Positive for abdominal pain. Negative for constipation.  Genitourinary: Negative for flank pain.  Musculoskeletal: Positive for back pain.  Neurological: Negative for weakness.       Chronic weakness in left lower extremity and difficulty moving at the hip since hip replacement.  Psychiatric/Behavioral: Negative for confusion.     Physical Exam Updated Vital Signs BP (!) 219/106 (BP Location: Left Arm)   Pulse 72   Temp 97.9 F (36.6 C) (Oral)   Resp 18   Ht 6\' 6"  (1.981 m)   Wt 111.1 kg   SpO2 97%   BMI 28.31 kg/m   Physical Exam  Constitutional: He appears well-developed.  HENT:  Head: Atraumatic.  Eyes: Pupils are equal, round, and reactive to light.  Neck: Neck supple.  Cardiovascular: Normal rate.    Pulmonary/Chest: Effort normal.  Abdominal: Soft. There is tenderness.  Moderate tenderness left lower quadrant with some fullness.  Musculoskeletal: He exhibits no edema.  Some tenderness in lumbar and left lower back also.  Neurological: He is alert.  Mild confusion at baseline since stroke.  Skin: Skin is warm. Capillary refill takes less than 2 seconds.     ED Treatments / Results  Labs (all labs ordered are listed, but only abnormal results are displayed) Labs Reviewed - No data to display  EKG None  Radiology No results found.  Procedures Procedures (including critical care time)  Medications Ordered in ED Medications  iopamidol (ISOVUE-300) 61 % injection 100 mL (has no administration in time range)  ondansetron (ZOFRAN) injection 4 mg (4 mg Intravenous Given 10/16/18 2204)     Initial Impression / Assessment and Plan / ED Course  I have reviewed the triage vital signs and the nursing notes.  Pertinent labs & imaging results that were available during my care of the patient were reviewed by me and considered in my medical decision making (see chart for details).     Patient with left lower quadrant abdominal pain and back pain.  Has had previous intra-abdominal hematoma.  Seen yesterday and lab work is reassuring.  Lab was not repeated.  However CT scan will be done to evaluate for intra-abdominal pathology.  Care turned over to Dr. Roxanne Mins.  Also has tachycardia.  He is already on labetalol and has not had tonight's dose.  May also be pain related.  Can be managed as an outpatient   Final Clinical Impressions(s) / ED Diagnoses   Final diagnoses:  Low back pain, unspecified back pain laterality, unspecified chronicity, unspecified whether sciatica present  Left lower quadrant abdominal pain  Hypertension, unspecified type    ED Discharge Orders    None       Davonna Belling, MD 10/16/18 2318

## 2018-10-17 MED ORDER — METHOCARBAMOL 1000 MG/10ML IJ SOLN
INTRAMUSCULAR | Status: AC
  Filled 2018-10-17: qty 10

## 2018-10-17 MED ORDER — METHOCARBAMOL 1000 MG/10ML IJ SOLN
1000.0000 mg | Freq: Once | INTRAVENOUS | Status: AC
  Administered 2018-10-17: 1000 mg via INTRAVENOUS
  Filled 2018-10-17: qty 10

## 2018-10-17 MED ORDER — ONDANSETRON HCL 4 MG/2ML IJ SOLN
4.0000 mg | Freq: Once | INTRAMUSCULAR | Status: AC
  Administered 2018-10-17: 4 mg via INTRAVENOUS

## 2018-10-17 MED ORDER — ONDANSETRON HCL 4 MG/2ML IJ SOLN
INTRAMUSCULAR | Status: AC
  Filled 2018-10-17: qty 2

## 2018-10-17 MED ORDER — MORPHINE SULFATE (PF) 4 MG/ML IV SOLN
4.0000 mg | Freq: Once | INTRAVENOUS | Status: AC
  Administered 2018-10-17: 4 mg via INTRAVENOUS
  Filled 2018-10-17: qty 1

## 2018-10-17 MED ORDER — METOCLOPRAMIDE HCL 5 MG/ML IJ SOLN
10.0000 mg | Freq: Once | INTRAMUSCULAR | Status: DC
  Filled 2018-10-17: qty 2

## 2018-10-17 MED ORDER — KETOROLAC TROMETHAMINE 30 MG/ML IJ SOLN
30.0000 mg | Freq: Once | INTRAMUSCULAR | Status: AC
  Administered 2018-10-17: 30 mg via INTRAVENOUS
  Filled 2018-10-17: qty 1

## 2018-10-17 NOTE — ED Provider Notes (Signed)
Care assumed from Dr. Alvino Chapel, patient with low back pain and left lower quadrant pain pending CT of abdomen and pelvis.  CT of abdomen and pelvis shows no acute process.  Previously noted abdominal wall fluid collection has resolved.  On my review of images, there does appear to be some residual inflammatory response and that may account for his left lower quadrant pain.  He is given methocarbamol, morphine, ketorolac intravenously with good relief of pain.  He is directed to get his prescriptions for diazepam and hydrocodone-acetaminophen filled and to take them as directed.  He is to continue taking his over-the-counter naproxen.  Follow-up with PCP.  Return precautions discussed.   Delora Fuel, MD 71/06/26 [phone]

## 2018-10-17 NOTE — ED Notes (Signed)
Pt states he does not want nausea med at this time bc he feels the nausea is coming from too much medication, med not given at this time but help in case he changes his mind, informed pt to call out if he wants it

## 2018-10-17 NOTE — Discharge Instructions (Addendum)
Apply ice for thirty minutes at a time, 3-4 times a day, as needed.  Get your prescriptions filled, and take them as directed. Continue taking naproxen.

## 2018-10-19 ENCOUNTER — Encounter: Payer: Medicaid Other | Admitting: Vascular Surgery

## 2018-10-24 ENCOUNTER — Encounter (HOSPITAL_COMMUNITY): Payer: Self-pay | Admitting: Emergency Medicine

## 2018-10-24 ENCOUNTER — Emergency Department (HOSPITAL_COMMUNITY)
Admission: EM | Admit: 2018-10-24 | Discharge: 2018-10-24 | Disposition: A | Discharge status: Home / Self Care | Payer: Medicaid Other | Attending: Emergency Medicine | Admitting: Emergency Medicine

## 2018-10-24 ENCOUNTER — Other Ambulatory Visit: Payer: Self-pay

## 2018-10-24 ENCOUNTER — Emergency Department (HOSPITAL_COMMUNITY): Payer: Medicaid Other

## 2018-10-24 DIAGNOSIS — E119 Type 2 diabetes mellitus without complications: Secondary | ICD-10-CM | POA: Diagnosis not present

## 2018-10-24 DIAGNOSIS — F17210 Nicotine dependence, cigarettes, uncomplicated: Secondary | ICD-10-CM | POA: Insufficient documentation

## 2018-10-24 DIAGNOSIS — Z8673 Personal history of transient ischemic attack (TIA), and cerebral infarction without residual deficits: Secondary | ICD-10-CM | POA: Insufficient documentation

## 2018-10-24 DIAGNOSIS — Z79899 Other long term (current) drug therapy: Secondary | ICD-10-CM | POA: Diagnosis not present

## 2018-10-24 DIAGNOSIS — I10 Essential (primary) hypertension: Secondary | ICD-10-CM | POA: Insufficient documentation

## 2018-10-24 DIAGNOSIS — Z7984 Long term (current) use of oral hypoglycemic drugs: Secondary | ICD-10-CM | POA: Insufficient documentation

## 2018-10-24 DIAGNOSIS — Z96643 Presence of artificial hip joint, bilateral: Secondary | ICD-10-CM | POA: Diagnosis not present

## 2018-10-24 DIAGNOSIS — R109 Unspecified abdominal pain: Secondary | ICD-10-CM | POA: Insufficient documentation

## 2018-10-24 DIAGNOSIS — M545 Low back pain, unspecified: Secondary | ICD-10-CM

## 2018-10-24 LAB — CBC WITH DIFFERENTIAL/PLATELET
Abs Immature Granulocytes: 0.01 10*3/uL (ref 0.00–0.07)
Basophils Absolute: 0 10*3/uL (ref 0.0–0.1)
Basophils Relative: 0 %
EOS ABS: 0.1 10*3/uL (ref 0.0–0.5)
Eosinophils Relative: 2 %
HCT: 42.8 % (ref 39.0–52.0)
Hemoglobin: 13.4 g/dL (ref 13.0–17.0)
Immature Granulocytes: 0 %
LYMPHS ABS: 1.5 10*3/uL (ref 0.7–4.0)
Lymphocytes Relative: 23 %
MCH: 28.4 pg (ref 26.0–34.0)
MCHC: 31.3 g/dL (ref 30.0–36.0)
MCV: 90.7 fL (ref 80.0–100.0)
Monocytes Absolute: 0.5 10*3/uL (ref 0.1–1.0)
Monocytes Relative: 8 %
Neutro Abs: 4.3 10*3/uL (ref 1.7–7.7)
Neutrophils Relative %: 67 %
PLATELETS: 207 10*3/uL (ref 150–400)
RBC: 4.72 MIL/uL (ref 4.22–5.81)
RDW: 14 % (ref 11.5–15.5)
WBC: 6.5 10*3/uL (ref 4.0–10.5)
nRBC: 0 % (ref 0.0–0.2)

## 2018-10-24 LAB — LIPASE, BLOOD: Lipase: 35 U/L (ref 11–51)

## 2018-10-24 LAB — COMPREHENSIVE METABOLIC PANEL
ALBUMIN: 4.1 g/dL (ref 3.5–5.0)
ALK PHOS: 107 U/L (ref 38–126)
ALT: 41 U/L (ref 0–44)
AST: 33 U/L (ref 15–41)
Anion gap: 6 (ref 5–15)
BILIRUBIN TOTAL: 0.7 mg/dL (ref 0.3–1.2)
BUN: 25 mg/dL — AB (ref 6–20)
CALCIUM: 10.4 mg/dL — AB (ref 8.9–10.3)
CO2: 27 mmol/L (ref 22–32)
Chloride: 107 mmol/L (ref 98–111)
Creatinine, Ser: 1.2 mg/dL (ref 0.61–1.24)
GFR calc Af Amer: >60 mL/min (ref >60)
GFR calc non Af Amer: >60 mL/min (ref >60)
Glucose, Bld: 115 mg/dL — ABNORMAL HIGH (ref 70–99)
Potassium: 4.5 mmol/L (ref 3.5–5.1)
Sodium: 140 mmol/L (ref 135–145)
TOTAL PROTEIN: 8.1 g/dL (ref 6.5–8.1)

## 2018-10-24 LAB — URINALYSIS, ROUTINE W REFLEX MICROSCOPIC
Bacteria, UA: NONE SEEN
Bilirubin Urine: NEGATIVE
Glucose, UA: NEGATIVE mg/dL
Hgb urine dipstick: NEGATIVE
KETONES UR: NEGATIVE mg/dL
Leukocytes, UA: NEGATIVE
NITRITE: NEGATIVE
PROTEIN: 30 mg/dL — AB
Specific Gravity, Urine: 1.023 (ref 1.005–1.030)
pH: 5 (ref 5.0–8.0)

## 2018-10-24 MED ORDER — METHOCARBAMOL 500 MG PO TABS: 1000.0000 mg | ORAL_TABLET | Freq: Four times a day (QID) | ORAL | 0 refills | Status: DC | PRN

## 2018-10-24 MED ORDER — IOPAMIDOL (ISOVUE-300) INJECTION 61%
100.0000 mL | Freq: Once | INTRAVENOUS | Status: AC | PRN
  Administered 2018-10-24: 100 mL via INTRAVENOUS

## 2018-10-24 MED ORDER — OXYCODONE-ACETAMINOPHEN 5-325 MG PO TABS: ORAL_TABLET | ORAL | 0 refills | Status: DC

## 2018-10-24 MED ORDER — ONDANSETRON HCL 4 MG/2ML IJ SOLN
4.0000 mg | INTRAMUSCULAR | Status: DC | PRN
  Administered 2018-10-24: 4 mg via INTRAVENOUS
  Filled 2018-10-24: qty 2

## 2018-10-24 MED ORDER — FENTANYL CITRATE (PF) 100 MCG/2ML IJ SOLN: 50.0000 ug | INTRAMUSCULAR | Status: DC | PRN

## 2018-10-24 MED ORDER — NAPROXEN 250 MG PO TABS: 250.0000 mg | ORAL_TABLET | Freq: Two times a day (BID) | ORAL | 0 refills | Status: DC | PRN

## 2018-10-24 MED ORDER — KETOROLAC TROMETHAMINE 30 MG/ML IJ SOLN
30.0000 mg | Freq: Once | INTRAMUSCULAR | Status: AC
  Administered 2018-10-24: 30 mg via INTRAVENOUS
  Filled 2018-10-24: qty 1

## 2018-10-24 MED ORDER — METHOCARBAMOL 500 MG PO TABS
1000.0000 mg | ORAL_TABLET | Freq: Once | ORAL | Status: AC
  Administered 2018-10-24: 1000 mg via ORAL
  Filled 2018-10-24: qty 2

## 2018-10-24 NOTE — ED Triage Notes (Signed)
Patient c/o left flank pain that started 2 weeks ago in which he has been seen here in ER twice last week for the pain. Patient given antibiotics, valium, and morphine. Patient reports increase in pain today despite taking the medication. 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 Patient reports nausea and lack of appetite. Denies any fevers, vomiting, diarrhea, or urinary symptoms.

## 2018-10-24 NOTE — Discharge Instructions (Signed)
Take the prescriptions as directed.  Apply moist heat or ice to the area(s) of discomfort, for 15 minutes at a time, several times per day for the next few days.  Do not fall asleep on a heating or ice pack.  Call your regular medical doctor, your Orthopedic doctor and your Urologist on Monday to schedule a follow up appointment this week.  Return to the Emergency Department immediately if worsening.

## 2018-10-24 NOTE — ED Provider Notes (Signed)
[facility] EMERGENCY DEPARTMENT Provider Note   CSN: [id number] Arrival date & time: 10/24/18  1118     History   Chief Complaint Chief Complaint  Patient presents with  . Flank Pain    HPI Raymond Barrera. is a 57 y.o. male.  HPI  Pt was seen at 1220. Per pt, c/o gradual onset and persistence of constant left low back "pain" for the past 2 weeks. Pain began after he bent over to pick something up and felt a pop in his lower back. Pt endorses hx of chronic LBP and lumbar surgery this past year complicated by intra-abd hematoma that required IR drainage. Pt has had continued left sided abd soreness since that time.  Pain worsens with palpation of the area and body position changes. Denies incont/retention of bowel or bladder, no saddle anesthesia, no new focal motor weakness, no tingling/numbness in extremities, no fevers, no injury, no CP/SOB, no dysuria/hematuria, no N/V/D, no rash. The symptoms have been associated with no other complaints. The patient has a significant history of similar symptoms previously, recently being evaluated for this complaint and multiple prior evals for same; this is pt's 3rd ED visit in the past 2 weeks for this complaint. Pt is scheduled to f/u with [facility] MD in January for known left renal mass.     Past Medical History:  Diagnosis Date  . Anxiety    due to the stroke  . Arthritis   . Back pain    hx of buldging disc  . Diabetes mellitus without complication ([place])    takes Metformin daily  . High cholesterol    takes Zocor daily  . Hypertension    takes Benazepril and HCTZ  daily  . Joint pain   . Joint swelling   . Memory impairment    occassional - from stroke  . Myocardial infarction ([place]) 1987  . Pneumonia    hx of-80's  . PONV (postoperative nausea and vomiting)    took a while for him to wake up after previous anesthesia  . Shortness of breath dyspnea    do to pain  . Sleep apnea    never had a sleep study,but states  Dr. Cindie Laroche says he has it  . Slurred speech   . Stroke ([place]) 08/2013   7 mini-strokes, last stroke 2017  . TIA (transient ischemic attack) 2014   x 7   . Urinary frequency     Patient Active Problem List   Diagnosis Date Noted  . Abdominal hematoma 08/13/2018  . Radiculopathy 06/16/2018  . Acute CVA (cerebrovascular accident) ([place]) 02/01/2017  . Diabetes mellitus ([place]) 02/01/2017  . HTN (hypertension) 02/01/2017  . Primary osteoarthritis of left hip 01/08/2016  . DJD (degenerative joint disease) 03/20/2015  . Primary osteoarthritis of right hip 02/26/2015  . Lumbar spondylosis 10/17/2014  . Lumbago 07/25/2014  . Abnormality of gait 07/25/2014  . Difficulty in walking(719.7) 07/25/2014  . TIA (transient ischemic attack) 03/09/2013  . Cocaine abuse ([place]) 03/09/2013  . Accelerated hypertension 03/09/2013  . Hyperlipidemia 03/09/2013  . Current smoker 03/09/2013    Past Surgical History:  Procedure Laterality Date  . ABDOMINAL EXPOSURE N/A 06/16/2018   Procedure: ABDOMINAL EXPOSURE;  Surgeon: Rosetta Posner, MD;  Location: [place];  Service: Vascular;  Laterality: N/A;  . ANKLE SURGERY  2008   left ankle-otif-[facility]  . ANTERIOR LUMBAR FUSION Bilateral 06/16/2018   Procedure: LUMBAR 4-5 LUMBAR 5-SACRUM 1 ANTERIOR LUMBAR INTERBODY FUSION WITH INSTRUMENTATION AND ALLOGRAFT;  Surgeon: Phylliss Bob, MD;  Location: [place];  Service: Orthopedics;  Laterality: Bilateral;  . BACK SURGERY    . HEMATOMA EVACUATION Left 08/13/2018   Procedure: EVACUATION HEMATOMA LEFT ABDOMINAL WALL;  Surgeon: Rosetta Posner, MD;  Location: [facility];  Service: Vascular;  Laterality: Left;  . IR RADIOLOGIST EVAL & MGMT  07/27/2018  . IR US GUIDE BX ASP/DRAIN  07/14/2018  . JOINT REPLACEMENT     both hips replaced   . LUMBAR LAMINECTOMY/DECOMPRESSION MICRODISCECTOMY Right 10/17/2014   Procedure: LUMBAR LAMINECTOMY/DECOMPRESSION MICRODISCECTOMY 2 LEVELS;  Surgeon: [name], MD;  Location: [place] [facility];   Service: Neurosurgery;  Laterality: Right;  Right L45 L5S1 laminectomy and foraminotomy  . MASS EXCISION  09/13/2012   Procedure: EXCISION MASS;  Surgeon: Jamesetta So, MD;  Location: [facility];  Service: General;  Laterality: N/A;  . TONSILLECTOMY    . TOTAL HIP ARTHROPLASTY Right 03/20/2015  . TOTAL HIP ARTHROPLASTY Right 03/20/2015   Procedure: TOTAL HIP ARTHROPLASTY ANTERIOR APPROACH;  Surgeon: Renette Butters, MD;  Location: [place];  Service: Orthopedics;  Laterality: Right;  . TOTAL HIP ARTHROPLASTY Left 01/08/2016   Procedure: TOTAL HIP ARTHROPLASTY ANTERIOR APPROACH;  Surgeon: Renette Butters, MD;  Location: [place];  Service: Orthopedics;  Laterality: Left;        Home Medications    Prior to Admission medications   Medication Sig Start Date End Date Taking? Authorizing Provider  amLODipine (NORVASC) 10 MG tablet Take 10 mg by mouth daily.   Yes [provider]  Ascorbic Acid (VITAMIN C) 1000 MG tablet Take 1,000 mg by mouth daily.   Yes [provider]  aspirin 81 MG tablet Take 1 tablet (81 mg total) by mouth daily. 06/14/18  Yes Croitoru, Mihai, MD  benazepril (LOTENSIN) 20 MG tablet Take 20 mg by mouth daily.   Yes [provider]  diazepam (VALIUM) 5 MG tablet 1 po tid for spasm pain Patient taking differently: Take 5 mg by mouth every 8 (eight) hours as needed. 1 po tid for spasm pain 10/15/18  Yes Lily Kocher, PA-C  ferrous gluconate (IRON 27) 240 (27 FE) MG tablet Take 240 mg by mouth daily.   Yes [provider]  folic acid (FOLVITE) 518 MCG tablet Take 400 mcg by mouth daily.   Yes [provider]  gabapentin (NEURONTIN) 300 MG capsule Take 300 mg by mouth 3 (three) times daily.   Yes [provider]  glucose 4 GM chewable tablet Chew 1 tablet by mouth as needed for low blood sugar.   Yes [provider]  HYDROcodone-acetaminophen (NORCO/VICODIN) 5-325 MG tablet Take 1 tablet by mouth every 4 (four) hours as  needed. 10/15/18  Yes Lily Kocher, PA-C  labetalol (NORMODYNE) 200 MG tablet Take 200 mg by mouth 2 (two) times daily.   Yes [provider]  metFORMIN (GLUCOPHAGE) 500 MG tablet Take 500 mg by mouth 2 (two) times daily with a meal.   Yes [provider]  methocarbamol (ROBAXIN) 500 MG tablet Take 500 mg by mouth every 6 (six) hours as needed for muscle spasms.   Yes [provider]  Multiple Vitamin (MULTIVITAMIN WITH MINERALS) TABS tablet Take 1 tablet by mouth daily.   Yes [provider]  naproxen (NAPROSYN) 500 MG tablet Take 500 mg by mouth 2 (two) times daily as needed for mild pain.   Yes [provider]  Omega-3 Fatty Acids (FISH OIL) 1000 MG CAPS Take 1,000 mg by  mouth daily.   Yes [provider]  simvastatin (ZOCOR) 40 MG tablet Take 40 mg by mouth daily.   Yes [provider]  vitamin E 200 UNIT capsule Take 200 Units by mouth daily.   Yes [provider]    Family History Family History  Problem Relation Age of Onset  . Heart disease Father     Social History Social History   Tobacco Use  . Smoking status: Current Every Day Smoker    Packs/day: 0.25    Years: 47.00    Pack years: 11.75    Types: Cigarettes  . Smokeless tobacco: Never Used  . Tobacco comment: Less thant 1/4 of a pack.   Substance Use Topics  . Alcohol use: No    Comment: quit 2012  . Drug use: Not Currently    Types: Cocaine    Comment: many yrs ago., last time- late 2016     Allergies   Poison ivy extract [poison ivy extract]   Review of Systems Review of Systems ROS: Statement: All systems negative except as marked or noted in the HPI; Constitutional: Negative for fever and chills. ; ; Eyes: Negative for eye pain, redness and discharge. ; ; ENMT: Negative for ear pain, hoarseness, nasal congestion, sinus pressure and sore throat. ; ; Cardiovascular: Negative for chest pain, palpitations, diaphoresis, dyspnea and  peripheral edema. ; ; Respiratory: Negative for cough, wheezing and stridor. ; ; Gastrointestinal: +abd pain. Negative for nausea, vomiting, diarrhea, blood in stool, hematemesis, jaundice and rectal bleeding. . ; ; Genitourinary: Negative for dysuria, flank pain and hematuria. ; ; Musculoskeletal: +LBP. Negative for neck pain. Negative for swelling and trauma.; ; Skin: Negative for pruritus, rash, abrasions, blisters, bruising and skin lesion.; ; Neuro: +chronic LLE weakness. Negative for headache, lightheadedness and neck stiffness. Negative for weakness, altered level of consciousness, altered mental status, extremity weakness, paresthesias, involuntary movement, seizure and syncope.       Physical Exam Updated Vital Signs BP (!) 139/94 (BP Location: Left Arm)   Pulse 88   Temp 97.9 F (36.6 C) (Oral)   Resp 18   Ht 6\' 6"  (1.981 m)   Wt 109.8 kg   SpO2 99%   BMI 27.97 kg/m   Physical Exam 1225: Physical examination:  Nursing notes reviewed; Vital signs and O2 SAT reviewed;  Constitutional: Well developed, Well nourished, Well hydrated, In no acute distress; Head:  Normocephalic, atraumatic; Eyes: EOMI, PERRL, No scleral icterus; ENMT: Mouth and pharynx normal, Mucous membranes moist; Neck: Supple, Full range of motion, No lymphadenopathy; Cardiovascular: Regular rate and rhythm, No gallop; Respiratory: Breath sounds clear & equal bilaterally, No wheezes.  Speaking full sentences with ease, Normal respiratory effort/excursion; Chest: Nontender, Movement normal; Abdomen: Soft, +LLQ tenderness to palp. Nondistended, Normal bowel sounds; Genitourinary: No CVA tenderness; Spine:  No midline CS, TS, LS tenderness. +TTP left lumbar paraspinal muscles.;; Extremities: Peripheral pulses normal, No tenderness, No edema, No calf edema or asymmetry.; Neuro: AA&Ox3, mild confusion regarding recent dx from previous ED visits (baseline since stroke per pt's wife). Major CN grossly intact. No facial droop.  Speech clear. +chronic LLE weakness, otherwise no new gross focal motor deficits in extremities.; Skin: Color normal, Warm, Dry.; Psych:  Easily agitated and argumentative.    ED Treatments / Results  Labs (all labs ordered are listed, but only abnormal results are displayed)   EKG None  Radiology   Procedures Procedures (including critical care time)  Medications Ordered in ED Medications  fentaNYL (  SUBLIMAZE) injection 50 mcg (has no administration in time range)  ondansetron (ZOFRAN) injection 4 mg (4 mg Intravenous Given 10/24/18 1250)  methocarbamol (ROBAXIN) tablet 1,000 mg (1,000 mg Oral Given 10/24/18 1240)  ketorolac (TORADOL) 30 MG/ML injection 30 mg (30 mg Intravenous Given 10/24/18 1240)  iopamidol (ISOVUE-300) 61 % injection 100 mL (100 mLs Intravenous Contrast Given 10/24/18 1257)     Initial Impression / Assessment and Plan / ED Course  I have reviewed the triage vital signs and the nursing notes.  Pertinent labs & imaging results that were available during my care of the patient were reviewed by me and considered in my medical decision making (see chart for details).  MDM Reviewed: previous chart, nursing note and vitals Reviewed previous: labs and CT scan Interpretation: labs, x-ray and CT scan   Results for orders placed or performed during the hospital encounter of 10/24/18  Urinalysis, Routine w reflex microscopic- may I&O cath if menses  Result Value Ref Range   Color, Urine YELLOW YELLOW   APPearance CLEAR CLEAR   Specific Gravity, Urine 1.023 1.005 - 1.030   pH 5.0 5.0 - 8.0   Glucose, UA NEGATIVE NEGATIVE mg/dL   Hgb urine dipstick NEGATIVE NEGATIVE   Bilirubin Urine NEGATIVE NEGATIVE   Ketones, ur NEGATIVE NEGATIVE mg/dL   Protein, ur 30 (A) NEGATIVE mg/dL   Nitrite NEGATIVE NEGATIVE   Leukocytes, UA NEGATIVE NEGATIVE   WBC, UA 0-5 0 - 5 WBC/hpf   Bacteria, UA NONE SEEN NONE SEEN   Mucus PRESENT   Comprehensive metabolic panel  Result  Value Ref Range   Sodium 140 135 - 145 mmol/L   Potassium 4.5 3.5 - 5.1 mmol/L   Chloride 107 98 - 111 mmol/L   CO2 27 22 - 32 mmol/L   Glucose, Bld 115 (H) 70 - 99 mg/dL   BUN 25 (H) 6 - 20 mg/dL   Creatinine, Ser 1.20 0.61 - 1.24 mg/dL   Calcium 10.4 (H) 8.9 - 10.3 mg/dL   Total Protein 8.1 6.5 - 8.1 g/dL   Albumin 4.1 3.5 - 5.0 g/dL   AST 33 15 - 41 U/L   ALT 41 0 - 44 U/L   Alkaline Phosphatase 107 38 - 126 U/L   Total Bilirubin 0.7 0.3 - 1.2 mg/dL   GFR calc non Af Amer >60 >60 mL/min   GFR calc Af Amer >60 >60 mL/min   Anion gap 6 5 - 15  Lipase, blood  Result Value Ref Range   Lipase 35 11 - 51 U/L  CBC with Differential  Result Value Ref Range   WBC 6.5 4.0 - 10.5 K/uL   RBC 4.72 4.22 - 5.81 MIL/uL   Hemoglobin 13.4 13.0 - 17.0 g/dL   HCT 42.8 39.0 - 52.0 %   MCV 90.7 80.0 - 100.0 fL   MCH 28.4 26.0 - 34.0 pg   MCHC 31.3 30.0 - 36.0 g/dL   RDW 14.0 11.5 - 15.5 %   Platelets 207 150 - 400 K/uL   nRBC 0.0 0.0 - 0.2 %   Neutrophils Relative % 67 %   Neutro Abs 4.3 1.7 - 7.7 K/uL   Lymphocytes Relative 23 %   Lymphs Abs 1.5 0.7 - 4.0 K/uL   Monocytes Relative 8 %   Monocytes Absolute 0.5 0.1 - 1.0 K/uL   Eosinophils Relative 2 %   Eosinophils Absolute 0.1 0.0 - 0.5 K/uL   Basophils Relative 0 %   Basophils Absolute 0.0 0.0 -  0.1 K/uL   Immature Granulocytes 0 %   Abs Immature Granulocytes 0.01 0.00 - 0.07 K/uL   Ct Abdomen Pelvis W Contrast Result Date: 10/24/2018 CLINICAL DATA:  Acute abdominal and back pain EXAM: CT ABDOMEN AND PELVIS WITH CONTRAST TECHNIQUE: Multidetector CT imaging of the abdomen and pelvis was performed using the standard protocol following bolus administration of intravenous contrast. CONTRAST:  142mL ISOVUE-300 IOPAMIDOL (ISOVUE-300) INJECTION 61% COMPARISON:  10/16/2018 FINDINGS: Lower chest: Chronic elevation of the left hemidiaphragm. Bibasilar atelectasis/scarring, worse on the left. Mild cardiac enlargement. No pericardial pleural  effusion. Hepatobiliary: No focal liver abnormality is seen. No gallstones, gallbladder wall thickening, or biliary dilatation. Pancreas: Unremarkable. No pancreatic ductal dilatation or surrounding inflammatory changes. Spleen: Normal in size without focal abnormality. Adrenals/Urinary Tract: Normal adrenal glands. No renal obstruction, hydronephrosis, hydroureter, or obstructing ureteral calculus. Bladder is obscured by hip metallic artifact. Left kidney demonstrates a solid enhancing heterogeneous renal mass slightly exophytic in the lower pole measuring 4.3 x 4.1 cm, little interval change. Lesion remains concerning for a renal cell carcinoma. No significant right renal abnormality. Stomach/Bowel: Negative for bowel obstruction, significant dilatation, ileus, free air. Normal appendix demonstrated containing air. No free fluid, fluid collection, hemorrhage, or abscess. Vascular/Lymphatic: Aortoiliac atherosclerosis and tortuosity. No occlusive process, aneurysm, or dissection. No retroperitoneal hematoma. Mesenteric and renal vasculature remain patent. No bulky adenopathy. Reproductive: No significant finding by CT. Other: No inguinal or abdominal wall hernia. Postop changes of the abdomen with parenchymal scarring below the umbilicus. No abdominal wall fluid collection or abscess. Musculoskeletal: Previous bilateral hip arthroplasties. Previous lower lumbar fusion. No acute osseous finding. IMPRESSION: No acute intra-abdominal or pelvic finding by CT. 4.3 cm solid enhancing left renal lower pole mass compatible with renal cell carcinoma. Recommend nonemergent outpatient urology consultation. Aortoiliac atherosclerosis without aneurysm Other postoperative findings as above. Electronically Signed   By: Jerilynn Mages.  Shick M.D.   On: 10/24/2018 13:48     1415:  Labs per baseline. No clear UTI on Udip; UC pending. Workup continues to be reassuring since last ED visit. Long d/w pt's wife and pt with ED Charge RN present  regarding wife's frustrations with pt's pain, intermittent memory loss and agitation periods since stroke. Wife states she "just doesn't know what to do when he get's in pain like that" "so we just come here." Wife states she will call Ortho MD and Uro MD on Monday to obtain timely f/u. Pt states he feels better after meds and is ready to go home now. Dx and testing d/w pt and family.  Questions answered.  Verb understanding, agreeable to d/c home with outpt f/u.     Final Clinical Impressions(s) / ED Diagnoses   Final diagnoses:  None    ED Discharge Orders    None       Francine Graven, DO 10/28/18 1614

## 2018-10-25 ENCOUNTER — Ambulatory Visit: Payer: Medicaid Other | Admitting: Cardiovascular Disease

## 2018-10-25 ENCOUNTER — Encounter

## 2018-10-26 LAB — URINE CULTURE: Culture: NO GROWTH

## 2018-11-01 NOTE — Progress Notes (Signed)
Cardiology Office Note   Date:  11/02/2018   ID:  Raymond Barrera., DOB Mar 20, 1961, MRN [id number]  PCP:  Lucia Gaskins, MD Cardiologist:  Sanda Klein, MD 06/14/2018 Raymond Ferries, PA-C   No chief complaint on file.   History of Present Illness: Raymond Barrera. is a 57 y.o. male with a history of HTN, DM, HLD, CVA 01/2017, EF 40-45% 2014, 30-35% 01/2017, seen 05/2018 preop L spine surgery  11/15 [facility] visit back pain, pain rx & d/c 11/17 [facility] visit back pain, rx and d/c 11/24 [facility] visit back and flank pain, UA ok, urine Cx pending, pain rx and d/c  Raymond Barrera. presents for cardiology follow up.  He is still having a lot of back pain, has seen Raymond Barrera and has appts set up to see other MDs for his back.   He has some depression from the situation, is doing ok w/ this. He has frustrations from speech problems, energy and mobility problems. Decreased sexual activity. Balance problems. On meds for spasms.   Occasionally wakes w/ ankle edema, mostly on the L side (old fx and surgery). Also has pain in his hips.  This is in addition to his back pain.  His wife helps in his care.  She make sure that he sticks to a low-sodium diet and that he takes his medications.  He is not weighing right now, is moving and scales are stored.  However, his dyspnea on exertion is at baseline.  He wakes up about 3 am, hungry and thirsty, not SOB.   He is not having any chest pain.   Past Medical History:  Diagnosis Date  . Anxiety    due to the stroke  . Arthritis   . Back pain    hx of buldging disc  . Diabetes mellitus without complication ([place])    takes Metformin daily  . High cholesterol    takes Zocor daily  . Hypertension    takes Benazepril and HCTZ  daily  . Joint pain   . Joint swelling   . Memory impairment    occassional - from stroke  . Myocardial infarction ([place]) 1987  . Pneumonia    hx of-80's  . PONV (postoperative nausea and vomiting)    took  a while for him to wake up after previous anesthesia  . Shortness of breath dyspnea    do to pain  . Sleep apnea    never had a sleep study,but states Raymond. Cindie Barrera says he has it  . Slurred speech   . Stroke ([place]) 08/2013   7 mini-strokes, last stroke 2017  . TIA (transient ischemic attack) 2014   x 7   . Urinary frequency     Past Surgical History:  Procedure Laterality Date  . ABDOMINAL EXPOSURE N/A 06/16/2018   Procedure: ABDOMINAL EXPOSURE;  Surgeon: Raymond Posner, MD;  Location: [facility];  Service: Vascular;  Laterality: N/A;  . ANKLE SURGERY  2008   left ankle-otif-[facility]  . ANTERIOR LUMBAR FUSION Bilateral 06/16/2018   Procedure: LUMBAR 4-5 LUMBAR 5-SACRUM 1 ANTERIOR LUMBAR INTERBODY FUSION WITH INSTRUMENTATION AND ALLOGRAFT;  Surgeon: Raymond Bob, MD;  Location: [facility];  Service: Orthopedics;  Laterality: Bilateral;  . BACK SURGERY    . HEMATOMA EVACUATION Left 08/13/2018   Procedure: EVACUATION HEMATOMA LEFT ABDOMINAL WALL;  Surgeon: Raymond Posner, MD;  Location[facility];  Service: Vascular;  Laterality: Left;  . IR RADIOLOGIST EVAL &  MGMT  07/27/2018  . IR US GUIDE BX ASP/DRAIN  07/14/2018  . JOINT REPLACEMENT     both hips replaced   . LUMBAR LAMINECTOMY/DECOMPRESSION MICRODISCECTOMY Right 10/17/2014   Procedure: LUMBAR LAMINECTOMY/DECOMPRESSION MICRODISCECTOMY 2 LEVELS;  Surgeon: Raymond Lose, MD;  Location: [place];  Service: Neurosurgery;  Laterality: Right;  Right L45 L5S1 laminectomy and foraminotomy  . MASS EXCISION  09/13/2012   Procedure: EXCISION MASS;  Surgeon: Raymond So, MD;  Location: [facility];  Service: General;  Laterality: N/A;  . TONSILLECTOMY    . TOTAL HIP ARTHROPLASTY Right 03/20/2015  . TOTAL HIP ARTHROPLASTY Right 03/20/2015   Procedure: TOTAL HIP ARTHROPLASTY ANTERIOR APPROACH;  Surgeon: Raymond Butters, MD;  Location: [facility];  Service: Orthopedics;  Laterality: Right;  . TOTAL HIP ARTHROPLASTY Left 01/08/2016   Procedure: TOTAL HIP ARTHROPLASTY  ANTERIOR APPROACH;  Surgeon: Raymond Butters, MD;  Location: [facility];  Service: Orthopedics;  Laterality: Left;    Current Outpatient Medications  Medication Sig Dispense Refill  . amLODipine (NORVASC) 10 MG tablet Take 10 mg by mouth daily.    . Ascorbic Acid (VITAMIN C) 1000 MG tablet Take 1,000 mg by mouth daily.    Marland Kitchen aspirin 81 MG tablet Take 1 tablet (81 mg total) by mouth daily.    . benazepril (LOTENSIN) 20 MG tablet Take 20 mg by mouth daily.    . diazepam (VALIUM) 5 MG tablet 1 po tid for spasm pain (Patient taking differently: Take 5 mg by mouth every 8 (eight) hours as needed. 1 po tid for spasm pain) 21 tablet 0  . folic acid (FOLVITE) 854 MCG tablet Take 400 mcg by mouth daily.    Marland Kitchen gabapentin (NEURONTIN) 300 MG capsule Take 300 mg by mouth 3 (three) times daily.    Marland Kitchen glucose 4 GM chewable tablet Chew 1 tablet by mouth as needed for low blood sugar.    . HYDROcodone-acetaminophen (NORCO/VICODIN) 5-325 MG tablet Take 1 tablet by mouth every 4 (four) hours as needed. 15 tablet 0  . labetalol (NORMODYNE) 200 MG tablet Take 200 mg by mouth 2 (two) times daily.    . metFORMIN (GLUCOPHAGE) 500 MG tablet Take 500 mg by mouth 2 (two) times daily with a meal.    . methocarbamol (ROBAXIN) 500 MG tablet Take 2 tablets (1,000 mg total) by mouth 4 (four) times daily as needed for muscle spasms (muscle spasm/pain). 25 tablet 0  . Multiple Vitamin (MULTIVITAMIN WITH MINERALS) TABS tablet Take 1 tablet by mouth daily.    . Omega-3 Fatty Acids (FISH OIL) 1000 MG CAPS Take 1,000 mg by mouth daily.    Marland Kitchen oxyCODONE-acetaminophen (PERCOCET/ROXICET) 5-325 MG tablet 1 or 2 tabs PO q8h prn pain 12 tablet 0  . simvastatin (ZOCOR) 40 MG tablet Take 40 mg by mouth daily.    . vitamin E 200 UNIT capsule Take 200 Units by mouth daily.     No current facility-administered medications for this visit.     Allergies:   Poison ivy extract [poison ivy extract]    Social History:  The patient  reports that he  has been smoking cigarettes. He has a 11.75 pack-year smoking history. He has never used smokeless tobacco. He reports that he has current or past drug history. Drug: Cocaine. He reports that he does not drink alcohol.   Family History:  The patient's family history includes Heart disease in his father.  He indicated that his mother is alive. He indicated that his  father is deceased.   ROS:  Please see the history of present illness. All other systems are reviewed and negative.    PHYSICAL EXAM: VS:  BP 128/76   Pulse 82   Ht 6' 5.5" (1.969 m)   Wt 248 lb 8 oz (112.7 kg)   SpO2 96%   BMI 29.09 kg/m  , BMI Body mass index is 29.09 kg/m. GEN: Well nourished, well developed, male in no acute distress HEENT: normal for age  Neck: no JVD, no carotid bruit, no masses Cardiac: RRR; soft murmur, no rubs, or gallops Respiratory:  clear to auscultation bilaterally, normal work of breathing GI: soft, nontender, nondistended, + BS MS: no deformity or atrophy; trace pedal edema; distal pulses are 2+ in all 4 extremities  Skin: warm and dry, no rash Neuro:  Strength and sensation are intact Psych: euthymic mood, full affect   EKG:  EKG is not ordered today.  ECHO: 06/15/2018 - Left ventricle: The cavity size was mildly dilated. There was   moderate concentric hypertrophy. Systolic function was severely   reduced. The estimated ejection fraction was in the range of 20%   to 25%. Severe diffuse hypokinesis with no identifiable regional   variations. Doppler parameters are consistent with abnormal left   ventricular relaxation (grade 1 diastolic dysfunction). Acoustic   contrast opacification revealed no evidence ofthrombus. - Left atrium: The atrium was severely dilated. - Direct comparison with images from March 2018 shows little change   in left ventricular function.  MYOVIEW:  06/15/2017  Nuclear stress EF: 24%.  There was no ST segment deviation noted during stress.  No T wave  inversion was noted during stress.  This is a high risk study.  The left ventricular ejection fraction is severely decreased (<30%).   High risk nuclear study due to severe dilated cardiomyopathy, LVEF<30%. There is no reversible ischemia.  Recent Labs: 10/24/2018: ALT 41; BUN 25; Creatinine, Ser 1.20; Hemoglobin 13.4; Platelets 207; Potassium 4.5; Sodium 140  CBC    Component Value Date/Time   WBC 6.5 10/24/2018 1200   RBC 4.72 10/24/2018 1200   HGB 13.4 10/24/2018 1200   HCT 42.8 10/24/2018 1200   PLT 207 10/24/2018 1200   MCV 90.7 10/24/2018 1200   MCH 28.4 10/24/2018 1200   MCHC 31.3 10/24/2018 1200   RDW 14.0 10/24/2018 1200   LYMPHSABS 1.5 10/24/2018 1200   MONOABS 0.5 10/24/2018 1200   EOSABS 0.1 10/24/2018 1200   BASOSABS 0.0 10/24/2018 1200   CMP Latest Ref Rng & Units 10/24/2018 10/15/2018 08/13/2018  Glucose 70 - 99 mg/dL 115(H) 134(H) -  BUN 6 - 20 mg/dL 25(H) 14 -  Creatinine 0.61 - 1.24 mg/dL 1.20 1.22 1.48(H)  Sodium 135 - 145 mmol/L 140 142 -  Potassium 3.5 - 5.1 mmol/L 4.5 4.8 -  Chloride 98 - 111 mmol/L 107 107 -  CO2 22 - 32 mmol/L 27 30 -  Calcium 8.9 - 10.3 mg/dL 10.4(H) 9.9 -  Total Protein 6.5 - 8.1 g/dL 8.1 - -  Total Bilirubin 0.3 - 1.2 mg/dL 0.7 - -  Alkaline Phos 38 - 126 U/L 107 - -  AST 15 - 41 U/L 33 - -  ALT 0 - 44 U/L 41 - -     Lipid Panel Lab Results  Component Value Date   CHOL 180 02/02/2017   HDL 31 (L) 02/02/2017   LDLCALC 115 (H) 02/02/2017   TRIG 169 (H) 02/02/2017   CHOLHDL 5.8 02/02/2017  Wt Readings from Last 3 Encounters:  11/02/18 248 lb 8 oz (112.7 kg)  11/02/18 246 lb (111.6 kg)  10/24/18 242 lb (109.8 kg)     Other studies Reviewed: Additional studies/ records that were reviewed today include: Office notes, hospital records and testing.  ASSESSMENT AND PLAN:  1.  Chronic combined systolic and diastolic CHF: -His volume status is good by exam -His wife makes sure he is compliant with his  medications and with a low-sodium diet. - He is encouraged to Barrera some actual weight, I feel a weight of about 225 pounds would be good for him, I think he would feel better and moving around might be a little easier. -No med changes.  2.  Hypertension: His blood pressure is at target today, continue current therapy.   Current medicines are reviewed at length with the patient today.  The patient has concerns regarding medicines.  The following changes have been made:  no change  Labs/ tests ordered today include:  No orders of the defined types were placed in this encounter.   Disposition:   FU with Sanda Klein, MD  Signed, Raymond Ferries, PA-C  11/02/2018 3:09 PM    [place] Phone: [phone]; Fax: [phone]

## 2018-11-02 ENCOUNTER — Encounter: Payer: Self-pay | Admitting: Vascular Surgery

## 2018-11-02 ENCOUNTER — Other Ambulatory Visit: Payer: Self-pay

## 2018-11-02 ENCOUNTER — Encounter: Payer: Self-pay | Admitting: Physician Assistant

## 2018-11-02 ENCOUNTER — Encounter (INDEPENDENT_AMBULATORY_CARE_PROVIDER_SITE_OTHER): Payer: Self-pay

## 2018-11-02 ENCOUNTER — Ambulatory Visit: Payer: Medicaid Other | Admitting: Physician Assistant

## 2018-11-02 ENCOUNTER — Ambulatory Visit (INDEPENDENT_AMBULATORY_CARE_PROVIDER_SITE_OTHER): Payer: Medicaid Other | Admitting: Physician Assistant

## 2018-11-02 ENCOUNTER — Ambulatory Visit (INDEPENDENT_AMBULATORY_CARE_PROVIDER_SITE_OTHER): Payer: Self-pay | Admitting: Vascular Surgery

## 2018-11-02 VITALS — BP 128/76 | HR 82 | Ht 77.5 in | Wt 248.5 lb

## 2018-11-02 VITALS — BP 134/86 | HR 79 | Temp 97.0°F | Resp 18 | Ht 77.5 in | Wt 246.0 lb

## 2018-11-02 DIAGNOSIS — I5042 Chronic combined systolic (congestive) and diastolic (congestive) heart failure: Secondary | ICD-10-CM | POA: Diagnosis not present

## 2018-11-02 DIAGNOSIS — I10 Essential (primary) hypertension: Secondary | ICD-10-CM | POA: Diagnosis not present

## 2018-11-02 DIAGNOSIS — S301XXA Contusion of abdominal wall, initial encounter: Secondary | ICD-10-CM

## 2018-11-02 DIAGNOSIS — M5137 Other intervertebral disc degeneration, lumbosacral region: Secondary | ICD-10-CM

## 2018-11-02 NOTE — Progress Notes (Signed)
Patient name: Raymond Barrera. MRN: [id number] DOB: Nov 23, 1961 Sex: male  REASON FOR VISIT: Follow-up evacuation hematoma abdominal wall following anterior exposure for lumbar fusion  HPI: Raymond Barrera. is a 57 y.o. male here today for follow-up.  Initially underwent anterior exposure for lumbar fusion on 06/16/2018.  He developed a hematoma post discharge in his abdominal wall and was taken back to the operating room on 08/13/2018 for evacuation.  He is here today with his wife.  He has had complete healing of his abdominal wall incision.  He still has a multitude of complaints.  He reports pain in his lower abdomen and also back and bilateral hip pain.  He reports that he is very unstable on walking and is fallen on several occasions.  He is actually presented to the emergency room on November 15 November 16 and November 24 for back pain and spasm.  He reports that if he eats a large meal he has discomfort in his left lower abdominal incision area.  He reports that he is having no difficulty with bowel function.  Current Outpatient Medications  Medication Sig Dispense Refill  . amLODipine (NORVASC) 10 MG tablet Take 10 mg by mouth daily.    . Ascorbic Acid (VITAMIN C) 1000 MG tablet Take 1,000 mg by mouth daily.    Marland Kitchen aspirin 81 MG tablet Take 1 tablet (81 mg total) by mouth daily.    . benazepril (LOTENSIN) 20 MG tablet Take 20 mg by mouth daily.    . diazepam (VALIUM) 5 MG tablet 1 po tid for spasm pain (Patient taking differently: Take 5 mg by mouth every 8 (eight) hours as needed. 1 po tid for spasm pain) 21 tablet 0  . ferrous gluconate (IRON 27) 240 (27 FE) MG tablet Take 240 mg by mouth daily.    Marland Kitchen gabapentin (NEURONTIN) 300 MG capsule Take 300 mg by mouth 3 (three) times daily.    Marland Kitchen glucose 4 GM chewable tablet Chew 1 tablet by mouth as needed for low blood sugar.    . HYDROcodone-acetaminophen (NORCO/VICODIN) 5-325 MG tablet Take 1 tablet by mouth  every 4 (four) hours as needed. 15 tablet 0  . labetalol (NORMODYNE) 200 MG tablet Take 200 mg by mouth 2 (two) times daily.    . metFORMIN (GLUCOPHAGE) 500 MG tablet Take 500 mg by mouth 2 (two) times daily with a meal.    . methocarbamol (ROBAXIN) 500 MG tablet Take 2 tablets (1,000 mg total) by mouth 4 (four) times daily as needed for muscle spasms (muscle spasm/pain). 25 tablet 0  . Multiple Vitamin (MULTIVITAMIN WITH MINERALS) TABS tablet Take 1 tablet by mouth daily.    . Omega-3 Fatty Acids (FISH OIL) 1000 MG CAPS Take 1,000 mg by mouth daily.    Marland Kitchen oxyCODONE-acetaminophen (PERCOCET/ROXICET) 5-325 MG tablet 1 or 2 tabs PO q8h prn pain 12 tablet 0  . simvastatin (ZOCOR) 40 MG tablet Take 40 mg by mouth daily.    . vitamin E 200 UNIT capsule Take 200 Units by mouth daily.    . folic acid (FOLVITE) 774 MCG tablet Take 400 mcg by mouth daily.     No current facility-administered medications for this visit.      PHYSICAL EXAM: Vitals:   11/02/18 0819  BP: 134/86  Pulse: 79  Resp: 18  Temp: (!) 97 F (36.1 C)  TempSrc: Oral  SpO2: 99%  Weight: 246 lb (111.6 kg)  Height: 6' 5.5" (1.969 m)  GENERAL: The patient is a well-nourished male, in no acute distress. The vital signs are documented above. Abdominal incision is completely healed with no evidence of subcutaneous fluid with mild surrounding tenderness.  No erythema or inflammation  MEDICAL ISSUES: Multiple pain issues still after spine surgery and abdominal hematoma.  Had a very long discussion with the patient and his wife present.  Explained that it would be quite unlikely that his subcutaneous hematoma was causing any significant issues regarding his spasms hip pain and leg pain and unsteady gait.  I suspect that his tenderness will continue to resolve.  He is having some relief with heat alternating with ice per his wife's suggestion and rest.  I will see him again in 2 months for continued follow-up   Rosetta Posner, MD  [facility] Tel [phone] Pager [phone]

## 2018-11-02 NOTE — Patient Instructions (Signed)
Medication Instructions:  The current medical regimen is effective;  continue present plan and medications.  If you need a refill on your cardiac medications before your next appointment, please call your pharmacy.   Lab work: None Ordered. If you have labs (blood work) drawn today and your tests are completely normal, you will receive your results only by: Marland Kitchen MyChart Message (if you have MyChart) OR . A paper copy in the mail If you have any lab test that is abnormal or we need to change your treatment, we will call you to review the results.  Testing/Procedures: None Ordered.  Follow-Up: At [facility], you and your health needs are our priority.  As part of our continuing mission to provide you with exceptional heart care, we have created designated Provider Care Teams.  These Care Teams include your primary Cardiologist (physician) and Advanced Practice Providers (APPs -  Physician Assistants and Nurse Practitioners) who all work together to provide you with the care you need, when you need it. You will need a follow up appointment in 3 months. You may see Sanda Klein, MD or one of the following Advanced Practice Providers on your designated Care Team: [place], [place] . Fabian Sharp, PA-C  Any Other Special Instructions Will Be Listed Below (If Applicable). Try to increase your activity as tolerated. Try to decrease your weight, weigh daily.

## 2018-11-03 NOTE — Progress Notes (Signed)
We should try to transition from benazepril to [facility]. MCr

## 2018-11-04 ENCOUNTER — Telehealth: Payer: Self-pay

## 2018-11-04 NOTE — Telephone Encounter (Signed)
Attempted to contact patient regarding the message from Dr.C. LVM advising patient to call back, left call back number.

## 2018-11-04 NOTE — Telephone Encounter (Signed)
-----   Message from Lonn Georgia, PA-C sent at 11/04/2018  2:32 PM EST -----   ----- Message ----- From: Sanda Klein, MD Sent: 11/04/2018  12:52 PM EST To: Lonn Georgia, PA-C    ----- Message ----- From: Reola Mosher Sent: 11/04/2018  10:32 AM EST To: Sanda Klein, MD  Your right, a set of thought of that. As he is on benazepril 20 mg daily what dose of Entresto?  24-26?  Also, should we look at stopping the amlodipine?  Thanks  ----- Message ----- From: Sanda Klein, MD Sent: 11/03/2018  11:17 AM EST To: Lonn Georgia, PA-C    ----- Message ----- From: Reola Mosher Sent: 11/02/2018   3:15 PM EST To: Sanda Klein, MD

## 2018-11-04 NOTE — Progress Notes (Signed)
Would stop benazepril for 36 h washout and start Enrtresto 24-26. Then would bring back Q 2weeks to gradually titrate up Entresto while cutting amlodipine in half and maybe eventually stopping it completely (as BPpermits). MCr

## 2018-11-09 ENCOUNTER — Telehealth (HOSPITAL_COMMUNITY): Payer: Self-pay | Admitting: Family Medicine

## 2018-11-09 ENCOUNTER — Encounter (HOSPITAL_COMMUNITY): Payer: Self-pay

## 2018-11-09 ENCOUNTER — Ambulatory Visit (HOSPITAL_COMMUNITY): Payer: Medicaid Other | Admitting: Physical Therapy

## 2018-11-09 NOTE — Telephone Encounter (Signed)
11/09/18  8:37 left a message that we needed to cx and reschedule this appt.

## 2018-11-11 ENCOUNTER — Telehealth: Payer: Self-pay

## 2018-11-11 MED ORDER — SACUBITRIL-VALSARTAN 24-26 MG PO TABS: 1.0000 | ORAL_TABLET | Freq: Two times a day (BID) | ORAL | 1 refills | Status: DC

## 2018-11-11 NOTE — Telephone Encounter (Signed)
Dr.Croitoru message: Would stop benazepril for 36 h washout and start Enrtresto 24-26. Then would bring back Q 2weeks to gradually titrate up Entresto while cutting amlodipine in half and maybe eventually stopping it completely (as BPpermits). MCr

## 2018-11-11 NOTE — Telephone Encounter (Signed)
Patient was reached, advised per Dr.Croitoru to discontinue the benazepril medication for 36 hours before starting the new Entresto 24/26 dose. Patient verbalized understanding, also patient was made a follow up appointment with Rosaria Ferries, PA-C on December 31st to follow up regarding med change.

## 2018-11-11 NOTE — Telephone Encounter (Signed)
-----   Message from Lonn Georgia, PA-C sent at 11/04/2018  2:32 PM EST -----   ----- Message ----- From: Sanda Klein, MD Sent: 11/04/2018  12:52 PM EST To: Lonn Georgia, PA-C    ----- Message ----- From: Reola Mosher Sent: 11/04/2018  10:32 AM EST To: Sanda Klein, MD  Your right, a set of thought of that. As he is on benazepril 20 mg daily what dose of Entresto?  24-26?  Also, should we look at stopping the amlodipine?  Thanks  ----- Message ----- From: Sanda Klein, MD Sent: 11/03/2018  11:17 AM EST To: Lonn Georgia, PA-C    ----- Message ----- From: Reola Mosher Sent: 11/02/2018   3:15 PM EST To: Sanda Klein, MD

## 2018-11-18 ENCOUNTER — Ambulatory Visit (HOSPITAL_COMMUNITY): Payer: Medicaid Other | Attending: Orthopedic Surgery

## 2018-11-18 ENCOUNTER — Encounter (HOSPITAL_COMMUNITY): Payer: Self-pay

## 2018-11-18 ENCOUNTER — Other Ambulatory Visit: Payer: Self-pay

## 2018-11-18 DIAGNOSIS — R2689 Other abnormalities of gait and mobility: Secondary | ICD-10-CM | POA: Diagnosis present

## 2018-11-18 DIAGNOSIS — M25552 Pain in left hip: Secondary | ICD-10-CM

## 2018-11-18 DIAGNOSIS — R262 Difficulty in walking, not elsewhere classified: Secondary | ICD-10-CM | POA: Insufficient documentation

## 2018-11-18 DIAGNOSIS — M545 Low back pain, unspecified: Secondary | ICD-10-CM

## 2018-11-18 DIAGNOSIS — G8929 Other chronic pain: Secondary | ICD-10-CM

## 2018-11-18 DIAGNOSIS — M6281 Muscle weakness (generalized): Secondary | ICD-10-CM | POA: Diagnosis present

## 2018-11-18 NOTE — Therapy (Signed)
[place], [place], [address] Phone: [phone]   Fax:  [phone]  Physical Therapy Evaluation  Patient Details  Name: Raymond Barrera. MRN: [id number] Date of Birth: Jan 16, 1961 Referring Provider (PT): Phylliss Bob, MD   Encounter Date: 11/18/2018     11/18/18 1601  PT Visits / Re-Eval  Visit Number 1  Number of Visits 17  Date for PT Re-Evaluation 01/13/19 (mini-reassess due 12/16/18)  Authorization  Authorization Type Medicaid [place] (requesting visits starting in January 2020)  Authorization Time Period 11/18/18 - 01/14/19  Authorization - Visit Number 0  Authorization - Number of Visits 3  PT Time Calculation  PT Start Time 3149  PT Stop Time 1524  PT Time Calculation (min) 45 min  PT - End of Session  Equipment Utilized During Treatment Gait belt  Activity Tolerance Patient tolerated treatment well  Behavior During Therapy WFL for tasks assessed/performed    Past Medical History:  Diagnosis Date  . Anxiety    due to the stroke  . Arthritis   . Back pain    hx of buldging disc  . Diabetes mellitus without complication ([place])    takes Metformin daily  . High cholesterol    takes Zocor daily  . Hypertension    takes Benazepril and HCTZ  daily  . Joint pain   . Joint swelling   . Memory impairment    occassional - from stroke  . Myocardial infarction ([place]) 1987  . Pneumonia    hx of-80's  . PONV (postoperative nausea and vomiting)    took a while for him to wake up after previous anesthesia  . Shortness of breath dyspnea    do to pain  . Sleep apnea    never had a sleep study,but states Dr. Cindie Laroche says he has it  . Slurred speech   . Stroke ([place]) 08/2013   7 mini-strokes, last stroke 2017  . TIA (transient ischemic attack) 2014   x 7   . Urinary frequency     Past Surgical History:  Procedure Laterality Date  . ABDOMINAL EXPOSURE N/A 06/16/2018   Procedure: ABDOMINAL  EXPOSURE;  Surgeon: Rosetta Posner, MD;  Location: [place];  Service: Vascular;  Laterality: N/A;  . ANKLE SURGERY  2008   left ankle-otif-[facility]  . ANTERIOR LUMBAR FUSION Bilateral 06/16/2018   Procedure: LUMBAR 4-5 LUMBAR 5-SACRUM 1 ANTERIOR LUMBAR INTERBODY FUSION WITH INSTRUMENTATION AND ALLOGRAFT;  Surgeon: Phylliss Bob, MD;  Location: [facility];  Service: Orthopedics;  Laterality: Bilateral;  . BACK SURGERY    . HEMATOMA EVACUATION Left 08/13/2018   Procedure: EVACUATION HEMATOMA LEFT ABDOMINAL WALL;  Surgeon: Rosetta Posner, MD;  [facility];  Service: Vascular;  Laterality: Left;  . IR RADIOLOGIST EVAL & MGMT  07/27/2018  . IR US GUIDE BX ASP/DRAIN  07/14/2018  . JOINT REPLACEMENT     both hips replaced   . LUMBAR LAMINECTOMY/DECOMPRESSION MICRODISCECTOMY Right 10/17/2014   Procedure: LUMBAR LAMINECTOMY/DECOMPRESSION MICRODISCECTOMY 2 LEVELS;  Surgeon: [name], MD;  Location: [facility];  Service: Neurosurgery;  Laterality: Right;  Right L45 L5S1 laminectomy and foraminotomy  . MASS EXCISION  09/13/2012   Procedure: EXCISION MASS;  Surgeon: Jamesetta So, MD;  Location: [facility];  Service: General;  Laterality: N/A;  . TONSILLECTOMY    . TOTAL HIP ARTHROPLASTY Right 03/20/2015  . TOTAL HIP ARTHROPLASTY Right 03/20/2015   Procedure: TOTAL HIP ARTHROPLASTY ANTERIOR APPROACH;  Surgeon: Ernesta Amble  Percell Miller, MD;  Location: [facility];  Service: Orthopedics;  Laterality: Right;  . TOTAL HIP ARTHROPLASTY Left 01/08/2016   Procedure: TOTAL HIP ARTHROPLASTY ANTERIOR APPROACH;  Surgeon: Renette Butters, MD;  Location: [facility];  Service: Orthopedics;  Laterality: Left;    There were no vitals filed for this visit.      11/18/18 1503  Symptoms/Limitations  Subjective e and his wife report extensive medical history and timeline (confirmed with chart review). Mr. Lucy is s/p L4-S1 anterior lumbar interbody fusion, requiring posterior fusion with instrumentation performed as a two part surgery from  06/16/2018 to 06/17/2018. About 10 days following back surgery patient noticed ongoing swelling/mass under incision and pain in LLQ. He was seen at [facility] on 8/101/9 for ongoing pain and a CT showed a large abdominal fluid collection. On 07/14/18 a drain was placed for seroma aspiration and the drain was later removed on 07/27/28. He presented again to [facility] on 08/11/18 and another seroma was found and he had surgery to evacuate the hematoma. He had a follow up with vascular surgeon on 12/3 and incision was fully healed. Patient had presented to the emergency room on November 15 November 16 and November 24 for back pain and spasm. At his vascular and cardiac appointments on 12/3 he was complaining of balance and walking difficulty and ongoing back and hip pain, Lt greater than Rt.   Patient is accompained by: Family member (wife)  Pertinent History bil THA (2016/2017), L4-S1 fusion 06/16/18, CVA (2017)  Limitations Walking;Standing;Lifting;Sitting;House hold activities  How long can you sit comfortably? must have back supported or experiences pain  How long can you stand comfortably? several minutes with support  How long can you walk comfortably? short distances with assistive device  Patient Stated Goals to improve balance and strength to improve independence with mobility and reduce fall risk  Pain Assessment  Currently in Pain? Yes  Pain Score 7  Pain Location Hip  Pain Orientation Left  Pain Descriptors / Indicators Aching;Sore  Pain Type Chronic pain  Pain Onset More than a month ago  Pain Frequency Intermittent  Aggravating Factors  laying on Lt side, stairs, walking  Pain Relieving Factors rest  Effect of Pain on Daily Activities moderate       11/18/18 0001  Assessment  Medical Diagnosis Low Back Pain and Hip Pain  Referring Provider (PT) Phylliss Bob, MD  Hand Dominance Right  Prior Therapy yes for multiple conditions  Precautions  Precautions None  Restrictions  Weight Bearing  Restrictions No  Balance Screen  Has the patient fallen in the past 6 months Yes  How many times? 6 (at least 6 times since July)  Has the patient had a decrease in activity level because of a fear of falling?  Yes  Is the patient reluctant to leave their home because of a fear of falling?  Yes  [place] residence  Living Arrangements Spouse/significant other;Non-relatives/Friends  Available Help at Discharge Family;Friend(s)  Type of [place] to enter  [place] Two level  Alternate Level Stairs-Number of Steps [place] - 2 wheels;Cane - quad;Cane - single point;Crutches;Shower seat (tub shower combo)  Additional Comments Patient and spouse are staying on second floor of a friends home currently. They rely on public transportation at this time and some of their equipment is located in a storage facility as they have not wanted to bring it into  their friends home. Patient primarily uses lofstrand crutches to mobilize at home and in community  Prior Function  Level of Independence Independent with community mobility with device;Needs assistance with ADLs;Needs assistance with homemaking  Vocation On disability  Cognition  Overall Cognitive Status Within Functional Limits for tasks assessed  Observation/Other Assessments  Focus on Therapeutic Outcomes (FOTO)  NA  Functional Tests  Functional tests Single leg stance  Single Leg Stance  Comments bil LE's = 0 seconds  ROM / Strength  AROM / PROM / Strength Strength  Strength  Strength Assessment Site Hip;Knee;Ankle  Right/Left Knee Right;Left  Right Hip Flexion 4/5  Right Hip Extension 2+/5  Right Hip ABduction 4/5  Left Hip Flexion 3-/5 (painful)  Left Hip Extension 2/5  Left Hip ABduction 2-/5 (painful)  Right Knee Flexion 4+/5  Right Knee Extension 5/5  Left Knee Flexion 3+/5  Left Knee Extension 4/5  Right  Ankle Dorsiflexion 4+/5  Left Ankle Dorsiflexion 4+/5  Palpation  Palpation comment patient reports severe tenderness to pain provocation with palpation to Lt greater trochanter and gluteus medius tendon insertion. Denies pain with palpation to gluteus maximus.  Ambulation/Gait  Stairs Yes  Stairs Assistance 6: Modified independent (Device/Increase time)  Stair Management Technique Two rails;Step to pattern;Forwards  Number of Stairs 4  Height of Stairs 6  Standardized Balance Assessment  Standardized Balance Assessment TUG  Timed Up and Go Test  TUG Normal TUG  Normal TUG (seconds) 18.8  TUG Comments with RW     Objective measurements completed on examination: See above findings.         11/18/18 1549  PT Education  Education Details Educated on exam findings and on plan to initiate therapy in January for [facility] request process.  Person(s) Educated Patient;Spouse  Methods Explanation  Comprehension Verbalized understanding      PT Short Term Goals - 11/21/18 1907      PT SHORT TERM GOAL #1   Title  Patient and spouse to be able to identify 5/5 safety factors in order to reduce fall risk and improve general safety     Time  2    Period  Weeks    Status  New    Target Date  12/16/18      PT SHORT TERM GOAL #2   Title  Patient to be independent in correctly and consistently performing targeted HEP, to be updated as appropriate, to improve balance and strength for increased safety and independence with mobility.    Time  4    Period  Weeks    Target Date  12/30/18      PT SHORT TERM GOAL #3   Title  Patient to be able to complete TUG with LRAD in 15 seconds or less in order to show increased mobilty and reduced fall risk     Time  4    Period  Weeks    Status  New        PT Long Term Goals - 11/18/18 1632      PT LONG TERM GOAL #1   Title  Patient will ambulate at 0.8 m/s with LRAD during 2MWT to demonstrate improved gait/mobiltiy, reduce fall risk, and  fall within the community ambulator category demonstrating improve community access.    Time  8    Period  Weeks    Status  New    Target Date  01/27/19      PT LONG TERM GOAL #2   Title  Patient will  demonstrate SLS for 10 seconds on bil LE to indicate improve balance and improve safety with gait and stair negotiation to reduce fall risk.    Time  8    Period  Weeks    Status  New      PT LONG TERM GOAL #3   Title  Patient to be able to independently dress and perform self care based tasks in unsupported standing with no LOB or concern for fall in order to improve general function and QOL .    Time  8    Period  Weeks    Status  New          11/18/18 1548  Plan  Clinical Impression Statement Mr. Mullarkey presents for physical therapy evaluation for low back pain and bil hip pain, Lt>Rt. He is s/p L4-S1 decompression and fusion on 2/83/15 with complication of hematoma and 2 evacuations with the final one performed on 08/14/18. His incisions were fully healed at follow up on 11/02/18. He has continued to have pain in back and bil hip pain with Lt hip worse than Rt, both have been replaced. Objective examination was limited today by extensive history provided by pat and spouse. Mr. Blanke experienced a stroke in 2017 and continues to have difficulty with speech and memory and relies on his wife to help manage medications and recall medical history. He has bil LE weakness, Lt>Rt, and pain with resistance testing to Lt hip. He has tenderness along Lt greater trochanter and gluteus medius tendon. His balance is limited and he has a frequent fall history; his TUG time indicates he is at risk for falling. Currently he ambulate primarily with lofstrand crutches and occasionally with SPC. He is limited by hip and back pain with gait and stair mobility. He will benefit from skilled PT interventions to address impairments and improve safety with mobility to reduce fall risk.  Clinical Presentation Stable   Clinical Presentation due to: weakness, impaired balance and gait, decreased endurance, pain, clinical judgement  Clinical Decision Making Moderate  Pt will benefit from skilled therapeutic intervention in order to improve on the following deficits Abnormal gait;Decreased balance;Decreased endurance;Decreased activity tolerance;Decreased mobility;Difficulty walking;Obesity;Improper body mechanics;Pain;Postural dysfunction;Decreased strength;Decreased knowledge of use of DME;Decreased knowledge of precautions  Rehab Potential Fair  PT Frequency 2x / week  PT Duration 4 weeks  PT Treatment/Interventions ADLs/Self Care Home Management;Aquatic Therapy;Electrical Stimulation;Iontophoresis 4mg /ml Dexamethasone;Moist Heat;DME Instruction;Gait training;Stair training;Functional mobility training;Therapeutic activities;Neuromuscular re-education;Balance training;Therapeutic exercise;Patient/family education;Manual techniques;Passive range of motion;Taping  PT Next Visit Plan Review eval and goals. Provide initial HEP and initiate balance training and LE strengthening. Perform ROM testing for low back and 5xsit to stand testing as well as 2 MWT.   Consulted and Agree with Plan of Care Patient;Family member/caregiver  Family Member Consulted patient's wife         Patient will benefit from skilled therapeutic intervention in order to improve the following deficits and impairments:  Abnormal gait, Decreased balance, Decreased endurance, Decreased activity tolerance, Decreased mobility, Difficulty walking, Obesity, Improper body mechanics, Pain, Postural dysfunction, Decreased strength, Decreased knowledge of use of DME, Decreased knowledge of precautions  Visit Diagnosis: Pain in left hip  Chronic low back pain, unspecified back pain laterality, unspecified whether sciatica present  Muscle weakness (generalized)  Difficulty in walking, not elsewhere classified  Other abnormalities of gait and  mobility     Problem List Patient Active Problem List   Diagnosis Date Noted  . Abdominal hematoma 08/13/2018  . Radiculopathy 06/16/2018  .  Acute CVA (cerebrovascular accident) ([place]) 02/01/2017  . Diabetes mellitus ([place]) 02/01/2017  . HTN (hypertension) 02/01/2017  . Primary osteoarthritis of left hip 01/08/2016  . DJD (degenerative joint disease) 03/20/2015  . Primary osteoarthritis of right hip 02/26/2015  . Lumbar spondylosis 10/17/2014  . Lumbago 07/25/2014  . Abnormality of gait 07/25/2014  . Difficulty in walking(719.7) 07/25/2014  . TIA (transient ischemic attack) 03/09/2013  . Cocaine abuse ([place]) 03/09/2013  . Accelerated hypertension 03/09/2013  . Hyperlipidemia 03/09/2013  . Current smoker 03/09/2013    [name], PT, DPT Physical Therapist with [facility]  11/18/2018 5:46 PM    [facility] [address] [place], [place], [address] Phone: [phone]   Fax:  [phone]  Name: Roni Scow. MRN: [id number] Date of Birth: January 27, 1961

## 2018-11-26 ENCOUNTER — Telehealth: Payer: Self-pay | Admitting: Cardiovascular Disease

## 2018-11-26 NOTE — Telephone Encounter (Signed)
Follow Up:      Returning your call from today. 

## 2018-11-26 NOTE — Telephone Encounter (Signed)
PA was completed today, awaiting determination.

## 2018-11-26 NOTE — Telephone Encounter (Signed)
Contacted patient, spoke with wife. She advised they had no used the 30 day card, I will mail one to them to have while we wait for the response of PA which should be received on Monday.   Suanne Marker, Patient has not yet started the [facility]. They would like to know if the appointment on 12/31 is still needed by you since he has yet to start the medication they were following up on. Please let me know as soon as you can so I can let them know as they have a driver who comes to pick up the patient.   Thank you!

## 2018-11-26 NOTE — Telephone Encounter (Signed)
Attempted to start PA, have to do call in for [facility]. I attempted to call number was disconnected. Will try and call back.  I did contact patient and LVM advising him to call back on if he had used a copay card.  Left call back number.

## 2018-11-26 NOTE — Telephone Encounter (Signed)
New message     Patient calling the office for samples of medication:   1.  What medication and dosage are you requesting samples for?sacubitril-valsartan (ENTRESTO) 24-26 MG  2.  Are you currently out of this medication? Yes     Pt cant get medication filled due to Prior Auth  needed and would need to take it before appt on 12/31

## 2018-11-27 NOTE — Telephone Encounter (Signed)
I agree, push the appointment out to at least 2 weeks after he has started the [facility]. -Be sure they call us for side effects.

## 2018-11-29 NOTE — Telephone Encounter (Signed)
I called patient, advised to push out appointment until 2 weeks after starting the [facility].  LVM on wifes phone that I would be cancelling the appointment for tomorrow, and to call back to make appointment.  Left call back number.

## 2018-11-30 ENCOUNTER — Ambulatory Visit: Payer: Medicaid Other | Admitting: Physician Assistant

## 2018-12-07 ENCOUNTER — Ambulatory Visit (HOSPITAL_COMMUNITY): Payer: Medicaid Other | Attending: Orthopedic Surgery | Admitting: Physical Therapy

## 2018-12-07 DIAGNOSIS — M25552 Pain in left hip: Secondary | ICD-10-CM | POA: Insufficient documentation

## 2018-12-07 DIAGNOSIS — R2689 Other abnormalities of gait and mobility: Secondary | ICD-10-CM | POA: Insufficient documentation

## 2018-12-07 DIAGNOSIS — M6281 Muscle weakness (generalized): Secondary | ICD-10-CM | POA: Insufficient documentation

## 2018-12-07 DIAGNOSIS — M545 Low back pain: Secondary | ICD-10-CM | POA: Insufficient documentation

## 2018-12-07 DIAGNOSIS — R262 Difficulty in walking, not elsewhere classified: Secondary | ICD-10-CM | POA: Insufficient documentation

## 2018-12-07 DIAGNOSIS — G8929 Other chronic pain: Secondary | ICD-10-CM | POA: Insufficient documentation

## 2018-12-09 ENCOUNTER — Encounter (HOSPITAL_COMMUNITY): Payer: Self-pay

## 2018-12-09 ENCOUNTER — Ambulatory Visit (HOSPITAL_COMMUNITY): Payer: Medicaid Other

## 2018-12-09 DIAGNOSIS — G8929 Other chronic pain: Secondary | ICD-10-CM | POA: Diagnosis present

## 2018-12-09 DIAGNOSIS — M25552 Pain in left hip: Secondary | ICD-10-CM | POA: Diagnosis not present

## 2018-12-09 DIAGNOSIS — M545 Low back pain, unspecified: Secondary | ICD-10-CM

## 2018-12-09 DIAGNOSIS — R262 Difficulty in walking, not elsewhere classified: Secondary | ICD-10-CM | POA: Diagnosis present

## 2018-12-09 DIAGNOSIS — R2689 Other abnormalities of gait and mobility: Secondary | ICD-10-CM | POA: Diagnosis present

## 2018-12-09 DIAGNOSIS — M6281 Muscle weakness (generalized): Secondary | ICD-10-CM

## 2018-12-09 NOTE — Therapy (Signed)
[place], [place], [address] Phone: [phone]   Fax:  [phone]  Physical Therapy Treatment  Patient Details  Name: Raymond Barrera. MRN: [id number] Date of Birth: 12/11/1960 Referring Provider (PT): Phylliss Bob, MD   Encounter Date: 12/09/2018  PT End of Session - 12/09/18 1351    Visit Number  2    Number of Visits  17    Date for PT Re-Evaluation  01/27/19   Minireassess on 12/16/18   Authorization Type  Medicaid [place] approved 3 visits from 1/1--12/14/2018    Authorization Time Period  11/18/18 - 01/28/19    Authorization - Visit Number  1    Authorization - Number of Visits  3    PT Start Time  1346    PT Stop Time  1428    PT Time Calculation (min)  42 min    Equipment Utilized During Treatment  Gait belt    Activity Tolerance  Patient tolerated treatment well    Behavior During Therapy  WFL for tasks assessed/performed       Past Medical History:  Diagnosis Date  . Anxiety    due to the stroke  . Arthritis   . Back pain    hx of buldging disc  . Diabetes mellitus without complication ([place])    takes Metformin daily  . High cholesterol    takes Zocor daily  . Hypertension    takes Benazepril and HCTZ  daily  . Joint pain   . Joint swelling   . Memory impairment    occassional - from stroke  . Myocardial infarction ([place]) 1987  . Pneumonia    hx of-80's  . PONV (postoperative nausea and vomiting)    took a while for him to wake up after previous anesthesia  . Shortness of breath dyspnea    do to pain  . Sleep apnea    never had a sleep study,but states Dr. Cindie Laroche says he has it  . Slurred speech   . Stroke ([place]) 08/2013   7 mini-strokes, last stroke 2017  . TIA (transient ischemic attack) 2014   x 7   . Urinary frequency     Past Surgical History:  Procedure Laterality Date  . ABDOMINAL EXPOSURE N/A 06/16/2018   Procedure: ABDOMINAL EXPOSURE;  Surgeon: Rosetta Posner, MD;   Location: [facility];  Service: Vascular;  Laterality: N/A;  . ANKLE SURGERY  2008   left ankle-otif-[facility]  . ANTERIOR LUMBAR FUSION Bilateral 06/16/2018   Procedure: LUMBAR 4-5 LUMBAR 5-SACRUM 1 ANTERIOR LUMBAR INTERBODY FUSION WITH INSTRUMENTATION AND ALLOGRAFT;  Surgeon: Phylliss Bob, MD;  Location: [facility];  Service: Orthopedics;  Laterality: Bilateral;  . BACK SURGERY    . HEMATOMA EVACUATION Left 08/13/2018   Procedure: EVACUATION HEMATOMA LEFT ABDOMINAL WALL;  Surgeon: Rosetta Posner, MD;  [facility];  Service: Vascular;  Laterality: Left;  . IR RADIOLOGIST EVAL & MGMT  07/27/2018  . IR US GUIDE BX ASP/DRAIN  07/14/2018  . JOINT REPLACEMENT     both hips replaced   . LUMBAR LAMINECTOMY/DECOMPRESSION MICRODISCECTOMY Right 10/17/2014   Procedure: LUMBAR LAMINECTOMY/DECOMPRESSION MICRODISCECTOMY 2 LEVELS;  Surgeon: [name], MD;  Location: [facility];  Service: Neurosurgery;  Laterality: Right;  Right L45 L5S1 laminectomy and foraminotomy  . MASS EXCISION  09/13/2012   Procedure: EXCISION MASS;  Surgeon: Jamesetta So, MD;  [facility];  Service: General;  Laterality: N/A;  . TONSILLECTOMY    .  TOTAL HIP ARTHROPLASTY Right 03/20/2015  . TOTAL HIP ARTHROPLASTY Right 03/20/2015   Procedure: TOTAL HIP ARTHROPLASTY ANTERIOR APPROACH;  Surgeon: Renette Butters, MD;  Location: [facility];  Service: Orthopedics;  Laterality: Right;  . TOTAL HIP ARTHROPLASTY Left 01/08/2016   Procedure: TOTAL HIP ARTHROPLASTY ANTERIOR APPROACH;  Surgeon: Renette Butters, MD;  Location: [facility];  Service: Orthopedics;  Laterality: Left;    There were no vitals filed for this visit.  Subjective Assessment - 12/09/18 1349    Subjective  Pt reports he had to bend down to add air to his tires earlier today and increased pain in lower back following task.  Current pain scale 7/10 sharp pain with movements.    Pertinent History  bil THA (2016/2017), L4-S1 fusion 06/16/18, CVA (2017)    Patient Stated Goals  to  improve balance and strength to improve independence with mobility and reduce fall risk    Pain Score  7     Pain Location  Back    Pain Orientation  Lower    Pain Descriptors / Indicators  Sharp    Pain Type  Chronic pain    Pain Onset  More than a month ago    Pain Frequency  Intermittent    Aggravating Factors   laying on Lt side, stairs, walking    Pain Relieving Factors  rest    Effect of Pain on Daily Activities  moderate          [facility] PT Assessment - 12/09/18 0001      Assessment   Medical Diagnosis  Low Back Pain and Hip Pain    Referring Provider (PT)  Phylliss Bob, MD    Hand Dominance  Right    Prior Therapy  yes for multiple conditions      Precautions   Precautions  None      ROM / Strength   AROM / PROM / Strength  AROM      AROM   AROM Assessment Site  Lumbar    Lumbar Flexion  limited 25% with pain    Lumbar Extension  limited 40% wiht pain    Lumbar - Right Side Bend  proximal 1/3rd of thigh with pain    Lumbar - Left Side Bend  proximal 1/3rd of thigh with pain    Lumbar - Right Rotation  limited 75% wiht pain    Lumbar - Left Rotation  limited 50% no pain      Ambulation/Gait   Ambulation Distance (Feet)  491 Feet   2MWT   Assistive device  Lofstrands   1 Lofstrand Rt UE   Gait Comments  2MWT      Standardized Balance Assessment   Standardized Balance Assessment  Five Times Sit to Stand    Five times sit to stand comments   19.05" with HHA on thigh                   [facility] Adult PT Treatment/Exercise - 12/09/18 0001      Exercises   Exercises  Lumbar      Lumbar Exercises: Stretches   Single Knee to Chest Stretch  Right;Left;2 reps;30 seconds      Lumbar Exercises: Supine   Bridge  10 reps;3 seconds    Bridge Limitations  limited range due to weakness; 2 sets 3" holds      Lumbar Exercises: Sidelying   Clam  Both;10 reps;3 seconds    Clam Limitations  3" holds, no resistance  this session             PT  Education - 12/09/18 1614    Education Details  Reviewed goals, established HEP    Person(s) Educated  Patient    Methods  Explanation    Comprehension  Verbalized understanding       PT Short Term Goals - 11/21/18 1907      PT SHORT TERM GOAL #1   Title  Patient and spouse to be able to identify 5/5 safety factors in order to reduce fall risk and improve general safety     Time  2    Period  Weeks    Status  New    Target Date  12/16/18      PT SHORT TERM GOAL #2   Title  Patient to be independent in correctly and consistently performing targeted HEP, to be updated as appropriate, to improve balance and strength for increased safety and independence with mobility.    Time  4    Period  Weeks    Target Date  12/30/18      PT SHORT TERM GOAL #3   Title  Patient to be able to complete TUG with LRAD in 15 seconds or less in order to show increased mobilty and reduced fall risk     Time  4    Period  Weeks    Status  New        PT Long Term Goals - 11/18/18 1632      PT LONG TERM GOAL #1   Title  Patient will ambulate at 0.8 m/s with LRAD during 2MWT to demonstrate improved gait/mobiltiy, reduce fall risk, and fall within the community ambulator category demonstrating improve community access.    Time  8    Period  Weeks    Status  New    Target Date  01/27/19      PT LONG TERM GOAL #2   Title  Patient will demonstrate SLS for 10 seconds on bil LE to indicate improve balance and improve safety with gait and stair negotiation to reduce fall risk.    Time  8    Period  Weeks    Status  New      PT LONG TERM GOAL #3   Title  Patient to be able to independently dress and perform self care based tasks in unsupported standing with no LOB or concern for fall in order to improve general function and QOL .    Time  8    Period  Weeks    Status  New            Plan - 12/09/18 1423    Clinical Impression Statement  Reviewed goals and completed testing including ROM  assessment, 5 STS and 2MWT.  Therex focus on lumbar mobility, gluteal strengthening and established HEP.  Pt able to demonstrate and verbalize understanding of exercises given to do at home, given printout and encouraged to use towel for assistance with Lt LE SKTC.  EOS pt reports pain reduced to 5/10.      Rehab Potential  Fair    PT Frequency  2x / week    PT Duration  4 weeks    PT Treatment/Interventions  ADLs/Self Care Home Management;Aquatic Therapy;Electrical Stimulation;Iontophoresis 4mg /ml Dexamethasone;Moist Heat;DME Instruction;Gait training;Stair training;Functional mobility training;Therapeutic activities;Neuromuscular re-education;Balance training;Therapeutic exercise;Patient/family education;Manual techniques;Passive range of motion;Taping    PT Next Visit Plan  Review compliance with HEP, add theraband with clams as appropriate  for HEP.  Progress LE strengthening and balance training.      PT Home Exercise Plan  12/09/18: SKTC, bridge and clam       Patient will benefit from skilled therapeutic intervention in order to improve the following deficits and impairments:  Abnormal gait, Decreased balance, Decreased endurance, Decreased activity tolerance, Decreased mobility, Difficulty walking, Obesity, Improper body mechanics, Pain, Postural dysfunction, Decreased strength, Decreased knowledge of use of DME, Decreased knowledge of precautions  Visit Diagnosis: Pain in left hip  Chronic low back pain, unspecified back pain laterality, unspecified whether sciatica present  Muscle weakness (generalized)  Difficulty in walking, not elsewhere classified  Other abnormalities of gait and mobility     Problem List Patient Active Problem List   Diagnosis Date Noted  . Abdominal hematoma 08/13/2018  . Radiculopathy 06/16/2018  . Acute CVA (cerebrovascular accident) ([place]) 02/01/2017  . Diabetes mellitus ([place]) 02/01/2017  . HTN (hypertension) 02/01/2017  . Primary osteoarthritis of  left hip 01/08/2016  . DJD (degenerative joint disease) 03/20/2015  . Primary osteoarthritis of right hip 02/26/2015  . Lumbar spondylosis 10/17/2014  . Lumbago 07/25/2014  . Abnormality of gait 07/25/2014  . Difficulty in walking(719.7) 07/25/2014  . TIA (transient ischemic attack) 03/09/2013  . Cocaine abuse ([place]) 03/09/2013  . Accelerated hypertension 03/09/2013  . Hyperlipidemia 03/09/2013  . Current smoker 03/09/2013   Ihor Austin, [place]; [place]  Aldona Lento 12/09/2018, 4:29 PM  [facility] [address] [place], [place], [address] Phone: [phone]   Fax:  [phone]  Name: Jarius Dieudonne. MRN: [id number] Date of Birth: 1961/03/22

## 2018-12-09 NOTE — Patient Instructions (Signed)
Bridging    Slowly raise buttocks from floor, keeping stomach tight. Repeat 10 times per set. Do 2 sets per session. Do 4 sessions per week.  http://orth.exer.us/1096   Copyright  VHI. All rights reserved.   Knee to Chest (Flexion)    Pull knee toward chest. Feel stretch in lower back or buttock area. Breathing deeply, Hold 30 seconds. Repeat with other knee. Repeat 3 times. Do 2 sessions per day.  http://gt2.exer.us/225   Copyright  VHI. All rights reserved.   Clam Shell 45 Degrees    Lying with hips and knees bent 45, one pillow between knees and ankles. Lift knee. Be sure pelvis does not roll backward. Do not arch back. Do 10 times hold for 5", each leg, 4 times per week.  http://ss.exer.us/74   Copyright  VHI. All rights reserved.

## 2018-12-14 ENCOUNTER — Ambulatory Visit (HOSPITAL_COMMUNITY): Payer: Medicaid Other

## 2018-12-14 ENCOUNTER — Telehealth (HOSPITAL_COMMUNITY): Payer: Self-pay

## 2018-12-14 NOTE — Telephone Encounter (Signed)
No Show # 1: I called Mr. Northrop on the home number provided to check in as he did not arrive for his 1:45 pm appointment today. I was unable to leave a message to ask if he wishes to continue with therapy as today was his last approved visit with Medicaid. Will attempt to call again at later time and request more visits if unable to speak with patient.   [name], PT, DPT, [facility] Physical Therapist with [facility]  12/14/2018 3:48 PM

## 2018-12-16 ENCOUNTER — Encounter (HOSPITAL_COMMUNITY): Payer: Self-pay

## 2018-12-16 ENCOUNTER — Ambulatory Visit (HOSPITAL_COMMUNITY): Payer: Medicaid Other

## 2018-12-16 ENCOUNTER — Telehealth (HOSPITAL_COMMUNITY): Payer: Self-pay

## 2018-12-16 DIAGNOSIS — M545 Low back pain, unspecified: Secondary | ICD-10-CM

## 2018-12-16 DIAGNOSIS — M6281 Muscle weakness (generalized): Secondary | ICD-10-CM

## 2018-12-16 DIAGNOSIS — M25552 Pain in left hip: Secondary | ICD-10-CM

## 2018-12-16 DIAGNOSIS — R262 Difficulty in walking, not elsewhere classified: Secondary | ICD-10-CM

## 2018-12-16 DIAGNOSIS — R2689 Other abnormalities of gait and mobility: Secondary | ICD-10-CM

## 2018-12-16 DIAGNOSIS — G8929 Other chronic pain: Secondary | ICD-10-CM

## 2018-12-16 NOTE — Therapy (Addendum)
[place], [place], [address] Phone: [phone]   Fax:  [phone]  Physical Therapy Treatment  Patient Details  Name: Raymond Barrera. MRN: [id number] Date of Birth: 12-28-1960 Referring Provider (PT): Phylliss Bob, MD   Encounter Date: 12/16/2018  PT End of Session - 12/16/18 1425    Visit Number  3    Number of Visits  17    Date for PT Re-Evaluation  01/27/19   Minireassess 12/16/18   Authorization Type  Medicaid [place] approved 3 visits from 1/1--12/14/2018    Authorization Time Period  11/18/18 - 01/28/19    Authorization - Visit Number  1   This session not covered with Medicaid, free tx   Authorization - Number of Visits  3    PT Start Time  1340    PT Stop Time  1420    PT Time Calculation (min)  40 min    Equipment Utilized During Treatment  Gait belt    Activity Tolerance  Patient tolerated treatment well    Behavior During Therapy  WFL for tasks assessed/performed       Past Medical History:  Diagnosis Date  . Anxiety    due to the stroke  . Arthritis   . Back pain    hx of buldging disc  . Diabetes mellitus without complication ([place])    takes Metformin daily  . High cholesterol    takes Zocor daily  . Hypertension    takes Benazepril and HCTZ  daily  . Joint pain   . Joint swelling   . Memory impairment    occassional - from stroke  . Myocardial infarction ([place]) 1987  . Pneumonia    hx of-80's  . PONV (postoperative nausea and vomiting)    took a while for him to wake up after previous anesthesia  . Shortness of breath dyspnea    do to pain  . Sleep apnea    never had a sleep study,but states Dr. Cindie Laroche says he has it  . Slurred speech   . Stroke ([place]) 08/2013   7 mini-strokes, last stroke 2017  . TIA (transient ischemic attack) 2014   x 7   . Urinary frequency     Past Surgical History:  Procedure Laterality Date  . ABDOMINAL EXPOSURE N/A 06/16/2018   Procedure:  ABDOMINAL EXPOSURE;  Surgeon: Rosetta Posner, MD;  Location: [facility];  Service: Vascular;  Laterality: N/A;  . ANKLE SURGERY  2008   left ankle-otif-[facility]  . ANTERIOR LUMBAR FUSION Bilateral 06/16/2018   Procedure: LUMBAR 4-5 LUMBAR 5-SACRUM 1 ANTERIOR LUMBAR INTERBODY FUSION WITH INSTRUMENTATION AND ALLOGRAFT;  Surgeon: Phylliss Bob, MD;  Location: [facility];  Service: Orthopedics;  Laterality: Bilateral;  . BACK SURGERY    . HEMATOMA EVACUATION Left 08/13/2018   Procedure: EVACUATION HEMATOMA LEFT ABDOMINAL WALL;  Surgeon: Rosetta Posner, MD;  [facility];  Service: Vascular;  Laterality: Left;  . IR RADIOLOGIST EVAL & MGMT  07/27/2018  . IR US GUIDE BX ASP/DRAIN  07/14/2018  . JOINT REPLACEMENT     both hips replaced   . LUMBAR LAMINECTOMY/DECOMPRESSION MICRODISCECTOMY Right 10/17/2014   Procedure: LUMBAR LAMINECTOMY/DECOMPRESSION MICRODISCECTOMY 2 LEVELS;  Surgeon: [name], MD;  Location: [facility];  Service: Neurosurgery;  Laterality: Right;  Right L45 L5S1 laminectomy and foraminotomy  . MASS EXCISION  09/13/2012   Procedure: EXCISION MASS;  Surgeon: Jamesetta So, MD;  [facility];  Service:  General;  Laterality: N/A;  . TONSILLECTOMY    . TOTAL HIP ARTHROPLASTY Right 03/20/2015  . TOTAL HIP ARTHROPLASTY Right 03/20/2015   Procedure: TOTAL HIP ARTHROPLASTY ANTERIOR APPROACH;  Surgeon: Timothy D Murphy, MD;  [facility];  Service: Orthopedics;  Laterality: Right;  . TOTAL HIP ARTHROPLASTY Left 01/08/2016   Procedure: TOTAL HIP ARTHROPLASTY ANTERIOR APPROACH;  Surgeon: Timothy D Murphy, MD;  [facility];  Service: Orthopedics;  Laterality: Left;    There were no vitals filed for this visit.  Subjective Assessment - 12/16/18 1358    Subjective  Pt reports he is feeling good today, no reoprts of pain though has taken pain medication.  Reports some soreness following last session though was productive soreness.  Pt arrived with wife, stated they are looking for place to  live, reasoning unable to make it to last apt.      Patient is accompained by:  Family member   Wife   Patient Stated Goals  to improve balance and strength to improve independence with mobility and reduce fall risk    Currently in Pain?  No/denies    Pain Score  0-No pain    Pain Location  Back    Pain Descriptors / Indicators  Sore         [facility] PT Assessment - 12/16/18 0001      Assessment   Medical Diagnosis  Low Back Pain and Hip Pain    Referring Provider (PT)  Dumonski, Mark, MD    Hand Dominance  Right    Prior Therapy  yes for multiple conditions      Precautions   Precautions  None      Functional Tests   Functional tests  Single leg stance      Single Leg Stance   Comments  Rt average 17.6", Lt= 13.6"   was 0"     Standardized Balance Assessment   Standardized Balance Assessment  Five Times Sit to Stand    Five times sit to stand comments   18.33" with HHA on thigh   was 19.05" with HHA on thigh     Timed Up and Go Test   TUG  Normal TUG    Normal TUG (seconds)  10.33   was 18.8                  [facility] Adult PT Treatment/Exercise - 12/16/18 0001      Lumbar Exercises: Stretches   Single Knee to Chest Stretch  Right;Left;3 reps;30 seconds    Single Knee to Chest Stretch Limitations  towel assistance      Lumbar Exercises: Supine   Bridge  10 reps;3 seconds    Bridge Limitations  limited range due to weakness; 2 sets 3" holds      Lumbar Exercises: Sidelying   Clam  Both;10 reps;3 seconds    Clam Limitations  3" holds, no resistance this session             PT Education - 12/16/18 1435    Education Details  Reviewed importance of compliance iwht HEP, safety factors for fall prevention at home wiht pt and spouse    Person(s) Educated  Patient;Spouse    Methods  Explanation    Comprehension  Verbalized understanding;Need further instruction       PT Short Term Goals - 12/16/18 1403      PT SHORT TERM GOAL #1   Title  Patient  and spouse to be able to identify   5/5 safety factors in order to reduce fall risk and improve general safety     Baseline  12/16/18: Reviewed safety factors with pt and wife, verbalized understanding      PT SHORT TERM GOAL #2   Title  Patient to be independent in correctly and consistently performing targeted HEP, to be updated as appropriate, to improve balance and strength for increased safety and independence with mobility.    Baseline  12/16/18:  Pt unable to remember HEP without instructions for form and mechanics.    Status  On-going      PT SHORT TERM GOAL #3   Title  Patient to be able to complete TUG with LRAD in 15 seconds or less in order to show increased mobilty and reduced fall risk     Baseline  12/16/18: TUG 10.33" no AD    Status  Achieved        PT Long Term Goals - 12/16/18 1441      PT LONG TERM GOAL #1   Title  Patient will ambulate at 0.8 m/s with LRAD during 2MWT to demonstrate improved gait/mobiltiy, reduce fall risk, and fall within the community ambulator category demonstrating improve community access.    Baseline  12/09/2018: Able to ambulate 491ft with 1 loftstand Rt UE, gait velocity at 1.24714      PT LONG TERM GOAL #2   Title  Patient will demonstrate SLS for 10 seconds on bil LE to indicate improve balance and improve safety with gait and stair negotiation to reduce fall risk.    Baseline  Rt average 17.6", Lt= 13.6"    Status  Partially Met      PT LONG TERM GOAL #3   Title  Patient to be able to independently dress and perform self care based tasks in unsupported standing with no LOB or concern for fall in order to improve general function and QOL .    Baseline  12/16/18:  Wife reports she continues to assist due to Lt hip pain with donn/doffing and some assistance wiht self care.    Status  On-going            Plan - 12/16/18 1427    Clinical Impression Statement  Reviewed compliance with HEP, pt unable to recall exercises and required  instructions for proper form/mechanics.  Pt and wife present for beginning of session, reviewed safety factors at home wiht verbalized understanding.  Also reviewed goals wiht 5STS/TUG for functional testing.      Rehab Potential  Fair    PT Frequency  2x / week    PT Duration  4 weeks    PT Treatment/Interventions  ADLs/Self Care Home Management;Aquatic Therapy;Electrical Stimulation;Iontophoresis 4mg/ml Dexamethasone;Moist Heat;DME Instruction;Gait training;Stair training;Functional mobility training;Therapeutic activities;Neuromuscular re-education;Balance training;Therapeutic exercise;Patient/family education;Manual techniques;Passive range of motion;Taping    PT Next Visit Plan  Medicaid authorization?  Review compliance with HEP, add theraband with clams as appropriate for HEP.  Progress LE strengthening and balance training.      PT Home Exercise Plan  12/09/18: SKTC, bridge and clam    Consulted and Agree with Plan of Care  Patient;Family member/caregiver    Family Member Consulted  patient's wife       Patient will benefit from skilled therapeutic intervention in order to improve the following deficits and impairments:  Abnormal gait, Decreased balance, Decreased endurance, Decreased activity tolerance, Decreased mobility, Difficulty walking, Obesity, Improper body mechanics, Pain, Postural dysfunction, Decreased strength, Decreased knowledge of use of DME, Decreased knowledge of   precautions  Visit Diagnosis: Pain in left hip  Chronic low back pain, unspecified back pain laterality, unspecified whether sciatica present  Muscle weakness (generalized)  Difficulty in walking, not elsewhere classified  Other abnormalities of gait and mobility     Problem List Patient Active Problem List   Diagnosis Date Noted  . Abdominal hematoma 08/13/2018  . Radiculopathy 06/16/2018  . Acute CVA (cerebrovascular accident) ([place]) 02/01/2017  . Diabetes mellitus ([place]) 02/01/2017  . HTN  (hypertension) 02/01/2017  . Primary osteoarthritis of left hip 01/08/2016  . DJD (degenerative joint disease) 03/20/2015  . Primary osteoarthritis of right hip 02/26/2015  . Lumbar spondylosis 10/17/2014  . Lumbago 07/25/2014  . Abnormality of gait 07/25/2014  . Difficulty in walking(719.7) 07/25/2014  . TIA (transient ischemic attack) 03/09/2013  . Cocaine abuse ([place]) 03/09/2013  . Accelerated hypertension 03/09/2013  . Hyperlipidemia 03/09/2013  . Current smoker 03/09/2013   Ihor Austin, [place]; [place]  Aldona Lento 12/16/2018, 3:23 PM  [facility] [address] [place], [place], [address] Phone: [phone]   Fax:  [phone]  Name: Raymond Barrera. MRN: [id number] Date of Birth: [birth year]

## 2018-12-16 NOTE — Telephone Encounter (Signed)
Wife will talk to PT to change the schedule and get more visits approved. NF 12/16/2018.

## 2018-12-21 ENCOUNTER — Ambulatory Visit (HOSPITAL_COMMUNITY): Payer: Medicaid Other

## 2018-12-21 ENCOUNTER — Telehealth (HOSPITAL_COMMUNITY): Payer: Self-pay | Admitting: Family Medicine

## 2018-12-21 NOTE — Telephone Encounter (Signed)
12/21/18  cx with wife because Medicaid hadn't approved visits yet

## 2018-12-23 ENCOUNTER — Telehealth (HOSPITAL_COMMUNITY): Payer: Self-pay | Admitting: Family Medicine

## 2018-12-23 ENCOUNTER — Telehealth (HOSPITAL_COMMUNITY): Payer: Self-pay

## 2018-12-23 ENCOUNTER — Ambulatory Visit (HOSPITAL_COMMUNITY): Payer: Medicaid Other

## 2018-12-23 NOTE — Telephone Encounter (Signed)
No show.  Called and spoke to wife who stated they were not aware of Medicaid approval and had not been home to listen to message of today's apt.  Reminded next apt date and time wiht contact information given.    [address] [place]; [employer] [phone]

## 2018-12-23 NOTE — Telephone Encounter (Signed)
1/23  8:19am  I left a message to let them know that Mr. Raymond Barrera had been approved for more visits and we would see him today at his scheduled appt.

## 2018-12-28 ENCOUNTER — Encounter (HOSPITAL_COMMUNITY): Payer: Self-pay

## 2018-12-28 ENCOUNTER — Other Ambulatory Visit: Payer: Self-pay

## 2018-12-28 ENCOUNTER — Ambulatory Visit (HOSPITAL_COMMUNITY): Payer: Medicaid Other

## 2018-12-28 DIAGNOSIS — G8929 Other chronic pain: Secondary | ICD-10-CM

## 2018-12-28 DIAGNOSIS — M25552 Pain in left hip: Secondary | ICD-10-CM

## 2018-12-28 DIAGNOSIS — M545 Low back pain, unspecified: Secondary | ICD-10-CM

## 2018-12-28 DIAGNOSIS — M6281 Muscle weakness (generalized): Secondary | ICD-10-CM

## 2018-12-28 NOTE — Therapy (Signed)
[facility] [place], [place], [address] Phone: [phone]   Fax:  [phone]  Physical Therapy Treatment  Patient Details  Name: Raymond Barrera. MRN: [id number] Date of Birth: 05-28-1961 Referring Provider (PT): Phylliss Bob, MD   Encounter Date: 12/28/2018  PT End of Session - 12/28/18 1353    Visit Number  4    Number of Visits  17    Date for PT Re-Evaluation  01/27/19   Minireassess 12/16/18   Authorization Type  Medicaid [place] approved 3 visits from 1/1--12/14/2018; 12 units 12/21/18-01/31/19    Authorization Time Period  11/18/18 - 01/28/19    Authorization - Visit Number  1    Authorization - Number of Visits  12    PT Start Time  1350    PT Stop Time  1429    PT Time Calculation (min)  39 min    Equipment Utilized During Treatment  Gait belt    Activity Tolerance  Patient tolerated treatment well    Behavior During Therapy  WFL for tasks assessed/performed       Past Medical History:  Diagnosis Date  . Anxiety    due to the stroke  . Arthritis   . Back pain    hx of buldging disc  . Diabetes mellitus without complication ([place])    takes Metformin daily  . High cholesterol    takes Zocor daily  . Hypertension    takes Benazepril and HCTZ  daily  . Joint pain   . Joint swelling   . Memory impairment    occassional - from stroke  . Myocardial infarction ([facility]) 1987  . Pneumonia    hx of-80's  . PONV (postoperative nausea and vomiting)    took a while for him to wake up after previous anesthesia  . Shortness of breath dyspnea    do to pain  . Sleep apnea    never had a sleep study,but states Dr. Cindie Laroche says he has it  . Slurred speech   . Stroke ([place]) 08/2013   7 mini-strokes, last stroke 2017  . TIA (transient ischemic attack) 2014   x 7   . Urinary frequency     Past Surgical History:  Procedure Laterality Date  . ABDOMINAL EXPOSURE N/A 06/16/2018   Procedure: ABDOMINAL EXPOSURE;  Surgeon:  Rosetta Posner, MD;  Location: [facility];  Service: Vascular;  Laterality: N/A;  . ANKLE SURGERY  2008   left ankle-otif-[facility]  . ANTERIOR LUMBAR FUSION Bilateral 06/16/2018   Procedure: LUMBAR 4-5 LUMBAR 5-SACRUM 1 ANTERIOR LUMBAR INTERBODY FUSION WITH INSTRUMENTATION AND ALLOGRAFT;  Surgeon: Phylliss Bob, MD;  Location: [facility];  Service: Orthopedics;  Laterality: Bilateral;  . BACK SURGERY    . HEMATOMA EVACUATION Left 08/13/2018   Procedure: EVACUATION HEMATOMA LEFT ABDOMINAL WALL;  Surgeon: Rosetta Posner, MD;  [facility];  Service: Vascular;  Laterality: Left;  . IR RADIOLOGIST EVAL & MGMT  07/27/2018  . IR US GUIDE BX ASP/DRAIN  07/14/2018  . JOINT REPLACEMENT     both hips replaced   . LUMBAR LAMINECTOMY/DECOMPRESSION MICRODISCECTOMY Right 10/17/2014   Procedure: LUMBAR LAMINECTOMY/DECOMPRESSION MICRODISCECTOMY 2 LEVELS;  Surgeon: [name], MD;  Location: [facility];  Service: Neurosurgery;  Laterality: Right;  Right L45 L5S1 laminectomy and foraminotomy  . MASS EXCISION  09/13/2012   Procedure: EXCISION MASS;  Surgeon: Jamesetta So, MD;  [facility];  Service: General;  Laterality: N/A;  .  TONSILLECTOMY    . TOTAL HIP ARTHROPLASTY Right 03/20/2015  . TOTAL HIP ARTHROPLASTY Right 03/20/2015   Procedure: TOTAL HIP ARTHROPLASTY ANTERIOR APPROACH;  Surgeon: Renette Butters, MD;  Location: [facility];  Service: Orthopedics;  Laterality: Right;  . TOTAL HIP ARTHROPLASTY Left 01/08/2016   Procedure: TOTAL HIP ARTHROPLASTY ANTERIOR APPROACH;  Surgeon: Renette Butters, MD;  Location: [facility];  Service: Orthopedics;  Laterality: Left;    There were no vitals filed for this visit.  Subjective Assessment - 12/28/18 1353    Subjective  Patient reports he is having a lot of pain and he still has not been able to sort his medications out with medicaid. He reports 7/10.    Patient is accompained by:  Family member   Wife   Pertinent History  bil THA (2016/2017), L4-S1 fusion 06/16/18, CVA  (2017)    Limitations  Walking;Standing;Lifting;Sitting;House hold activities    Patient Stated Goals  to improve balance and strength to improve independence with mobility and reduce fall risk    Currently in Pain?  Yes    Pain Score  7     Pain Location  Hip    Pain Orientation  Left;Lateral    Pain Descriptors / Indicators  Aching    Pain Type  Chronic pain    Pain Onset  More than a month ago    Pain Frequency  Intermittent    Aggravating Factors   walking, raising Lt hip    Pain Relieving Factors  rest    Effect of Pain on Daily Activities  moderate        [facility] Adult PT Treatment/Exercise - 12/28/18 0001      Exercises   Exercises  Lumbar      Lumbar Exercises: Standing   Other Standing Lumbar Exercises  Hip Extension: 1x 10 reps bil LE, red theraband, 3 sec holds; Hip Abduction: 1x 10 reps bil LE, red theraband, 3 sec holds   Lt hip abduction reduce to no theraband due to pain   Other Standing Lumbar Exercises  SLS cone taps: (3 cones) 1 HHA contralateral UE, 10 reps bil LE      Lumbar Exercises: Supine   Bridge  --   attempted   Bridge Limitations  pt unable to perform due to pain limitations      Lumbar Exercises: Sidelying   Clam  Both;10 reps;3 seconds    Clam Limitations  3" holds, no resistance this session      Manual Therapy   Manual Therapy  Joint mobilization;Manual Traction    Manual therapy comments  manual therapy completed seperate from other treatment interventions    Joint Mobilization  3x 30-45 seconds lateral glides to Lt hip, grade III/IV for pain relief and mobility    Manual Traction  3x 30 seconds Lt hip distraction for pain relief         PT Education - 12/28/18 1435    Education Details  Reviewed importance of HEP participation to improve strength.     Person(s) Educated  Patient    Methods  Explanation    Comprehension  Verbalized understanding       PT Short Term Goals - 12/16/18 1403      PT SHORT TERM GOAL #1   Title  Patient  and spouse to be able to identify 5/5 safety factors in order to reduce fall risk and improve general safety     Baseline  12/16/18: Reviewed safety factors with pt and wife,  verbalized understanding      PT SHORT TERM GOAL #2   Title  Patient to be independent in correctly and consistently performing targeted HEP, to be updated as appropriate, to improve balance and strength for increased safety and independence with mobility.    Baseline  12/16/18:  Pt unable to remember HEP without instructions for form and mechanics.    Status  On-going      PT SHORT TERM GOAL #3   Title  Patient to be able to complete TUG with LRAD in 15 seconds or less in order to show increased mobilty and reduced fall risk     Baseline  12/16/18: TUG 10.33" no AD    Status  Achieved        PT Long Term Goals - 12/16/18 1441      PT LONG TERM GOAL #1   Title  Patient will ambulate at 0.8 m/s with LRAD during 2MWT to demonstrate improved gait/mobiltiy, reduce fall risk, and fall within the community ambulator category demonstrating improve community access.    Baseline  12/09/2018: Able to ambulate 463f with 1 loftstand Rt UE, gait velocity at 1.24714      PT LONG TERM GOAL #2   Title  Patient will demonstrate SLS for 10 seconds on bil LE to indicate improve balance and improve safety with gait and stair negotiation to reduce fall risk.    Baseline  Rt average 17.6", Lt= 13.6"    Status  Partially Met      PT LONG TERM GOAL #3   Title  Patient to be able to independently dress and perform self care based tasks in unsupported standing with no LOB or concern for fall in order to improve general function and QOL .    Baseline  12/16/18:  Wife reports she continues to assist due to Lt hip pain with donn/doffing and some assistance wiht self care.    Status  On-going        Plan - 12/28/18 1422    Clinical Impression Statement  Session focused on pain relief initially with joint mobilization to Lt hip and distraction.  Patient reported relief with manual interventions. He immediately felt pain return when raising his Lt LE to don shoe. He was unable to perform bridges due to pain in Lt hip however was able to complete clamshells, and standing hip exercises. He had difficulty with Lt LE flexion for cone taps however was able to complete them. He will continue to benefit from skilled PT interventions to address impairments and progress towards goals.    Rehab Potential  Fair    PT Frequency  2x / week    PT Duration  4 weeks    PT Treatment/Interventions  ADLs/Self Care Home Management;Aquatic Therapy;Electrical Stimulation;Iontophoresis 454mml Dexamethasone;Moist Heat;DME Instruction;Gait training;Stair training;Functional mobility training;Therapeutic activities;Neuromuscular re-education;Balance training;Therapeutic exercise;Patient/family education;Manual techniques;Passive range of motion;Taping    PT Next Visit Plan  Review compliance with HEP, add theraband with clams as appropriate for HEP. Progress LE strengthening and balance training. Continue with Lt hip distraction and mobilization for pain control.    PT Home Exercise Plan  12/09/18: SKTC, bridge and clam    Consulted and Agree with Plan of Care  Patient;Family member/caregiver    Family Member Consulted  patient's wife       Patient will benefit from skilled therapeutic intervention in order to improve the following deficits and impairments:  Abnormal gait, Decreased balance, Decreased endurance, Decreased activity tolerance, Decreased mobility, Difficulty walking, Obesity,  Improper body mechanics, Pain, Postural dysfunction, Decreased strength, Decreased knowledge of use of DME, Decreased knowledge of precautions  Visit Diagnosis: Pain in left hip  Chronic low back pain, unspecified back pain laterality, unspecified whether sciatica present  Muscle weakness (generalized)     Problem List Patient Active Problem List   Diagnosis Date Noted  .  Abdominal hematoma 08/13/2018  . Radiculopathy 06/16/2018  . Acute CVA (cerebrovascular accident) ([facility]) 02/01/2017  . Diabetes mellitus ([facility]) 02/01/2017  . HTN (hypertension) 02/01/2017  . Primary osteoarthritis of left hip 01/08/2016  . DJD (degenerative joint disease) 03/20/2015  . Primary osteoarthritis of right hip 02/26/2015  . Lumbar spondylosis 10/17/2014  . Lumbago 07/25/2014  . Abnormality of gait 07/25/2014  . Difficulty in walking(719.7) 07/25/2014  . TIA (transient ischemic attack) 03/09/2013  . Cocaine abuse ([place]) 03/09/2013  . Accelerated hypertension 03/09/2013  . Hyperlipidemia 03/09/2013  . Current smoker 03/09/2013    [name], PT, DPT, [facility] Physical Therapist with [facility]  12/28/2018 2:55 PM    [facility] [address] [place], [place], [address] Phone: [phone]   Fax:  [phone]  Name: Raymond Barrera. MRN: [id number] Date of Birth: Nov 30, 1961

## 2018-12-30 ENCOUNTER — Ambulatory Visit (HOSPITAL_COMMUNITY): Payer: Medicaid Other

## 2018-12-30 ENCOUNTER — Encounter (HOSPITAL_COMMUNITY): Payer: Self-pay

## 2018-12-30 DIAGNOSIS — M25552 Pain in left hip: Secondary | ICD-10-CM | POA: Diagnosis not present

## 2018-12-30 DIAGNOSIS — M545 Low back pain, unspecified: Secondary | ICD-10-CM

## 2018-12-30 DIAGNOSIS — R2689 Other abnormalities of gait and mobility: Secondary | ICD-10-CM

## 2018-12-30 DIAGNOSIS — R262 Difficulty in walking, not elsewhere classified: Secondary | ICD-10-CM

## 2018-12-30 DIAGNOSIS — M6281 Muscle weakness (generalized): Secondary | ICD-10-CM

## 2018-12-30 DIAGNOSIS — G8929 Other chronic pain: Secondary | ICD-10-CM

## 2018-12-30 NOTE — Patient Instructions (Signed)
Isometric Abdominal    Lying on back with knees bent, tighten stomach by pressing elbows down. Hold _5___ seconds. Repeat 10-15____ times per set. Do _1___ sets per session. Do __1__ sessions per day.  http://orth.exer.us/1086   Copyright  VHI. All rights reserved.   Pelvic Tilt: Anterior - Legs Bent (Supine)    Rotate pelvis up and arch back. Hold _5___ seconds. Relax. Repeat _10-15___ times per set. Do __1__ sets per session. Do _1___ sessions per day.  http://orth.exer.us/212   Copyright  VHI. All rights reserved.   HIP: Abduction / External Rotation (Band)  Picture doesn't match!!  No band around knees. Lie on back with hips and knees bent. Abdominal set, then let one knee fall out to side and return to midline. Repeat with other knee.Marland Kitchen Keep feet together. Hold _2-3__ seconds.  10-15___ reps per set, _1__ sets per day.  Copyright  VHI. All rights reserved.

## 2018-12-30 NOTE — Therapy (Signed)
[facility] [place], [place], [address] Phone: [phone]   Fax:  [phone]  Physical Therapy Treatment  Patient Details  Name: Raymond Barrera. MRN: [id number] Date of Birth: Nov 25, 1961 Referring Provider (PT): Phylliss Bob, MD   Encounter Date: 12/30/2018  PT End of Session - 12/30/18 1404    Visit Number  5    Number of Visits  17    Date for PT Re-Evaluation  01/27/19   Minireassess 12/16/18   Authorization Type  Medicaid [place] approved 3 visits from 1/1--12/14/2018; 12 units 12/21/18-01/31/19    Authorization Time Period  11/18/18 - 01/28/19    Authorization - Visit Number  2    Authorization - Number of Visits  12    PT Start Time  7253    PT Stop Time  1300    PT Time Calculation (min)  45 min    Equipment Utilized During Treatment  Gait belt    Activity Tolerance  Patient tolerated treatment well    Behavior During Therapy  WFL for tasks assessed/performed       Past Medical History:  Diagnosis Date  . Anxiety    due to the stroke  . Arthritis   . Back pain    hx of buldging disc  . Diabetes mellitus without complication ([facility])    takes Metformin daily  . High cholesterol    takes Zocor daily  . Hypertension    takes Benazepril and HCTZ  daily  . Joint pain   . Joint swelling   . Memory impairment    occassional - from stroke  . Myocardial infarction ([facility]) 1987  . Pneumonia    hx of-80's  . PONV (postoperative nausea and vomiting)    took a while for him to wake up after previous anesthesia  . Shortness of breath dyspnea    do to pain  . Sleep apnea    never had a sleep study,but states Dr. Cindie Laroche says he has it  . Slurred speech   . Stroke ([facility]) 08/2013   7 mini-strokes, last stroke 2017  . TIA (transient ischemic attack) 2014   x 7   . Urinary frequency     Past Surgical History:  Procedure Laterality Date  . ABDOMINAL EXPOSURE N/A 06/16/2018   Procedure: ABDOMINAL EXPOSURE;  Surgeon:  Rosetta Posner, MD;  Location: [facility];  Service: Vascular;  Laterality: N/A;  . ANKLE SURGERY  2008   left ankle-otif-[facility]  . ANTERIOR LUMBAR FUSION Bilateral 06/16/2018   Procedure: LUMBAR 4-5 LUMBAR 5-SACRUM 1 ANTERIOR LUMBAR INTERBODY FUSION WITH INSTRUMENTATION AND ALLOGRAFT;  Surgeon: Phylliss Bob, MD;  Location: [facility];  Service: Orthopedics;  Laterality: Bilateral;  . BACK SURGERY    . HEMATOMA EVACUATION Left 08/13/2018   Procedure: EVACUATION HEMATOMA LEFT ABDOMINAL WALL;  Surgeon: Rosetta Posner, MD;  [facility];  Service: Vascular;  Laterality: Left;  . IR RADIOLOGIST EVAL & MGMT  07/27/2018  . IR US GUIDE BX ASP/DRAIN  07/14/2018  . JOINT REPLACEMENT     both hips replaced   . LUMBAR LAMINECTOMY/DECOMPRESSION MICRODISCECTOMY Right 10/17/2014   Procedure: LUMBAR LAMINECTOMY/DECOMPRESSION MICRODISCECTOMY 2 LEVELS;  Surgeon: [name], MD;  Location: [facility];  Service: Neurosurgery;  Laterality: Right;  Right L45 L5S1 laminectomy and foraminotomy  . MASS EXCISION  09/13/2012   Procedure: EXCISION MASS;  Surgeon: Jamesetta So, MD;  [facility];  Service: General;  Laterality: N/A;  .  TONSILLECTOMY    . TOTAL HIP ARTHROPLASTY Right 03/20/2015  . TOTAL HIP ARTHROPLASTY Right 03/20/2015   Procedure: TOTAL HIP ARTHROPLASTY ANTERIOR APPROACH;  Surgeon: Renette Butters, MD;  Location: [facility];  Service: Orthopedics;  Laterality: Right;  . TOTAL HIP ARTHROPLASTY Left 01/08/2016   Procedure: TOTAL HIP ARTHROPLASTY ANTERIOR APPROACH;  Surgeon: Renette Butters, MD;  Location: [facility];  Service: Orthopedics;  Laterality: Left;    There were no vitals filed for this visit.  Subjective Assessment - 12/30/18 1208    Subjective  Pain not too bad today; 6/10. Is hoping he will have everything in place to get his pain medications today.     Patient is accompained by:  Family member   Wife   Pertinent History  bil THA (2016/2017), L4-S1 fusion 06/16/18, CVA (2017)    Limitations   Walking;Standing;Lifting;Sitting;House hold activities    Patient Stated Goals  to improve balance and strength to improve independence with mobility and reduce fall risk    Currently in Pain?  Yes    Pain Score  6     Pain Location  Hip    Pain Orientation  Left;Lateral    Pain Descriptors / Indicators  Aching    Pain Type  Chronic pain    Pain Onset  More than a month ago                       [facility] Adult PT Treatment/Exercise - 12/30/18 0001      Lumbar Exercises: Stretches   Hip Flexor Stretch  2 reps;30 seconds    Hip Flexor Stretch Limitations  painful left hip when attempted right LE stretch      Lumbar Exercises: Supine   Ab Set  10 reps;5 seconds    Pelvic Tilt  10 reps;5 seconds    Clam  10 reps;2 seconds    Bridge  10 reps;3 seconds    Bridge Limitations  + ab set      Lumbar Exercises: Sidelying   Clam  Both;10 reps;3 seconds   + ab set   Clam Limitations  3" holds, no resistance this session      Manual Therapy   Manual Therapy  Joint mobilization;Soft tissue mobilization    Manual therapy comments  manual therapy completed seperate from other treatment interventions    Joint Mobilization  attempted long axis traction    Soft tissue mobilization  left hip incision, gluts, ITB, prox thigh             PT Education - 12/30/18 1402    Education Details  Discussed purpose and technique of interventions throughout session. Advanced HEP.     Person(s) Educated  Patient    Methods  Explanation;Demonstration;Handout    Comprehension  Verbalized understanding;Returned demonstration       PT Short Term Goals - 12/16/18 1403      PT SHORT TERM GOAL #1   Title  Patient and spouse to be able to identify 5/5 safety factors in order to reduce fall risk and improve general safety     Baseline  12/16/18: Reviewed safety factors with pt and wife, verbalized understanding      PT SHORT TERM GOAL #2   Title  Patient to be independent in correctly and  consistently performing targeted HEP, to be updated as appropriate, to improve balance and strength for increased safety and independence with mobility.    Baseline  12/16/18:  Pt unable to remember  HEP without instructions for form and mechanics.    Status  On-going      PT SHORT TERM GOAL #3   Title  Patient to be able to complete TUG with LRAD in 15 seconds or less in order to show increased mobilty and reduced fall risk     Baseline  12/16/18: TUG 10.33" no AD    Status  Achieved        PT Long Term Goals - 12/16/18 1441      PT LONG TERM GOAL #1   Title  Patient will ambulate at 0.8 m/s with LRAD during 2MWT to demonstrate improved gait/mobiltiy, reduce fall risk, and fall within the community ambulator category demonstrating improve community access.    Baseline  12/09/2018: Able to ambulate 477f with 1 loftstand Rt UE, gait velocity at 1.24714      PT LONG TERM GOAL #2   Title  Patient will demonstrate SLS for 10 seconds on bil LE to indicate improve balance and improve safety with gait and stair negotiation to reduce fall risk.    Baseline  Rt average 17.6", Lt= 13.6"    Status  Partially Met      PT LONG TERM GOAL #3   Title  Patient to be able to independently dress and perform self care based tasks in unsupported standing with no LOB or concern for fall in order to improve general function and QOL .    Baseline  12/16/18:  Wife reports she continues to assist due to Lt hip pain with donn/doffing and some assistance wiht self care.    Status  On-going            Plan - 12/30/18 1404    Clinical Impression Statement  Continued with established plan of care. Initiated core stabilization exercises ab set, pelvic tilt, supine clams to HEP and ther ex. Attempted long axis traction of left hip with pain relief with tension on but much increased in left lateral hip pain when traction was released. Patient was able to perform supine bridges with ab set initiated first. Patient  appears to be hypersensitive to light touch and pressure over distal 1/2 of hip surgical incision. Continue with current plan, progress as able.     Rehab Potential  Fair    PT Frequency  2x / week    PT Duration  4 weeks    PT Treatment/Interventions  ADLs/Self Care Home Management;Aquatic Therapy;Electrical Stimulation;Iontophoresis 4105mml Dexamethasone;Moist Heat;DME Instruction;Gait training;Stair training;Functional mobility training;Therapeutic activities;Neuromuscular re-education;Balance training;Therapeutic exercise;Patient/family education;Manual techniques;Passive range of motion;Taping    PT Next Visit Plan  Review compliance with HEP, add theraband with clams as appropriate for HEP. Progress LE strengthening and balance training. Continue with Lt hip distraction and mobilization for pain control.    PT Home Exercise Plan  12/09/18: SKTC, bridge and clam; 12/30/2018 - ab set, pelvic tilt, supine clam w/ ab set    Consulted and Agree with Plan of Care  Patient;Family member/caregiver    Family Member Consulted  patient's wife       Patient will benefit from skilled therapeutic intervention in order to improve the following deficits and impairments:  Abnormal gait, Decreased balance, Decreased endurance, Decreased activity tolerance, Decreased mobility, Difficulty walking, Obesity, Improper body mechanics, Pain, Postural dysfunction, Decreased strength, Decreased knowledge of use of DME, Decreased knowledge of precautions  Visit Diagnosis: Pain in left hip  Chronic low back pain, unspecified back pain laterality, unspecified whether sciatica present  Muscle weakness (generalized)  Difficulty  in walking, not elsewhere classified  Other abnormalities of gait and mobility     Problem List Patient Active Problem List   Diagnosis Date Noted  . Abdominal hematoma 08/13/2018  . Radiculopathy 06/16/2018  . Acute CVA (cerebrovascular accident) ([place]) 02/01/2017  . Diabetes mellitus  ([place]) 02/01/2017  . HTN (hypertension) 02/01/2017  . Primary osteoarthritis of left hip 01/08/2016  . DJD (degenerative joint disease) 03/20/2015  . Primary osteoarthritis of right hip 02/26/2015  . Lumbar spondylosis 10/17/2014  . Lumbago 07/25/2014  . Abnormality of gait 07/25/2014  . Difficulty in walking(719.7) 07/25/2014  . TIA (transient ischemic attack) 03/09/2013  . Cocaine abuse ([place]) 03/09/2013  . Accelerated hypertension 03/09/2013  . Hyperlipidemia 03/09/2013  . Current smoker 03/09/2013    Floria Raveling. Hartnett-Rands, MS, PT Per [facility] [phone] 12/30/2018, 2:13 PM  [facility] [address] [place], [place], [address] Phone: [phone]   Fax:  [phone]  Name: Raymond Barrera. MRN: [id number] Date of Birth: 21-Sep-1961

## 2019-01-04 ENCOUNTER — Encounter (HOSPITAL_COMMUNITY): Payer: Self-pay

## 2019-01-04 ENCOUNTER — Ambulatory Visit (HOSPITAL_COMMUNITY): Payer: Medicaid Other | Attending: Orthopedic Surgery

## 2019-01-04 ENCOUNTER — Ambulatory Visit: Payer: Medicaid Other | Admitting: Vascular Surgery

## 2019-01-04 ENCOUNTER — Encounter: Payer: Self-pay | Admitting: Family

## 2019-01-04 ENCOUNTER — Other Ambulatory Visit: Payer: Self-pay

## 2019-01-04 DIAGNOSIS — M545 Low back pain: Secondary | ICD-10-CM | POA: Insufficient documentation

## 2019-01-04 DIAGNOSIS — M6281 Muscle weakness (generalized): Secondary | ICD-10-CM | POA: Insufficient documentation

## 2019-01-04 DIAGNOSIS — M25552 Pain in left hip: Secondary | ICD-10-CM | POA: Diagnosis not present

## 2019-01-04 DIAGNOSIS — R2689 Other abnormalities of gait and mobility: Secondary | ICD-10-CM | POA: Diagnosis present

## 2019-01-04 DIAGNOSIS — R262 Difficulty in walking, not elsewhere classified: Secondary | ICD-10-CM | POA: Diagnosis present

## 2019-01-04 DIAGNOSIS — G8929 Other chronic pain: Secondary | ICD-10-CM | POA: Diagnosis present

## 2019-01-04 NOTE — Patient Instructions (Signed)
Seated Figure 4 Piriformis Stretch reps: 3 sets: 1-2 hold: 30 seconds daily: 1  weekly: 7      Exercise image step 1   Exercise image step 2 try to perform when putting on and taking off shoes  Setup  Begin sitting upright in a chair with both feet on the ground. Bring the ankle of one leg up onto the knee of your opposite leg.  Movement  Apply a gentle pressure with one hand on the top of your bent knee, and lean forward until you feel a stretch in your buttocks. Relax, then repeat. Tip  Make sure to keep your shoulders relaxed and back straight during the exercise.

## 2019-01-04 NOTE — Therapy (Signed)
[place], [place], [address] Phone: [phone]   Fax:  [phone]  Physical Therapy Treatment  Patient Details  Name: Raymond Barrera. MRN: [id number] Date of Birth: 08/28/1961 Referring Provider (PT): Phylliss Bob, MD   Encounter Date: 01/04/2019  PT End of Session - 01/04/19 1352    Visit Number  6    Number of Visits  17    Date for PT Re-Evaluation  01/27/19   Minireassess 12/16/18   Authorization Type  Medicaid [place] approved 3 visits from 1/1--12/14/2018; 12 units 12/21/18-01/31/19    Authorization Time Period  11/18/18 - 01/28/19    Authorization - Visit Number  3    Authorization - Number of Visits  12    PT Start Time  1346    PT Stop Time  1428    PT Time Calculation (min)  42 min    Equipment Utilized During Treatment  Gait belt    Activity Tolerance  Patient tolerated treatment well    Behavior During Therapy  WFL for tasks assessed/performed       Past Medical History:  Diagnosis Date  . Anxiety    due to the stroke  . Arthritis   . Back pain    hx of buldging disc  . Diabetes mellitus without complication ([place])    takes Metformin daily  . High cholesterol    takes Zocor daily  . Hypertension    takes Benazepril and HCTZ  daily  . Joint pain   . Joint swelling   . Memory impairment    occassional - from stroke  . Myocardial infarction ([place]) 1987  . Pneumonia    hx of-80's  . PONV (postoperative nausea and vomiting)    took a while for him to wake up after previous anesthesia  . Shortness of breath dyspnea    do to pain  . Sleep apnea    never had a sleep study,but states Dr. Cindie Laroche says he has it  . Slurred speech   . Stroke ([place]) 08/2013   7 mini-strokes, last stroke 2017  . TIA (transient ischemic attack) 2014   x 7   . Urinary frequency     Past Surgical History:  Procedure Laterality Date  . ABDOMINAL EXPOSURE N/A 06/16/2018   Procedure: ABDOMINAL EXPOSURE;  Surgeon:  Rosetta Posner, MD;  Location: [facility];  Service: Vascular;  Laterality: N/A;  . ANKLE SURGERY  2008   left ankle-otif-[facility]  . ANTERIOR LUMBAR FUSION Bilateral 06/16/2018   Procedure: LUMBAR 4-5 LUMBAR 5-SACRUM 1 ANTERIOR LUMBAR INTERBODY FUSION WITH INSTRUMENTATION AND ALLOGRAFT;  Surgeon: Phylliss Bob, MD;  Location: [facility];  Service: Orthopedics;  Laterality: Bilateral;  . BACK SURGERY    . HEMATOMA EVACUATION Left 08/13/2018   Procedure: EVACUATION HEMATOMA LEFT ABDOMINAL WALL;  Surgeon: Rosetta Posner, MD;  [facility];  Service: Vascular;  Laterality: Left;  . IR RADIOLOGIST EVAL & MGMT  07/27/2018  . IR US GUIDE BX ASP/DRAIN  07/14/2018  . JOINT REPLACEMENT     both hips replaced   . LUMBAR LAMINECTOMY/DECOMPRESSION MICRODISCECTOMY Right 10/17/2014   Procedure: LUMBAR LAMINECTOMY/DECOMPRESSION MICRODISCECTOMY 2 LEVELS;  Surgeon: [name], MD;  Location: [facility];  Service: Neurosurgery;  Laterality: Right;  Right L45 L5S1 laminectomy and foraminotomy  . MASS EXCISION  09/13/2012   Procedure: EXCISION MASS;  Surgeon: Jamesetta So, MD;  [facility];  Service: General;  Laterality: N/A;  .  TONSILLECTOMY    . TOTAL HIP ARTHROPLASTY Right 03/20/2015  . TOTAL HIP ARTHROPLASTY Right 03/20/2015   Procedure: TOTAL HIP ARTHROPLASTY ANTERIOR APPROACH;  Surgeon: Renette Butters, MD;  Location: [facility];  Service: Orthopedics;  Laterality: Right;  . TOTAL HIP ARTHROPLASTY Left 01/08/2016   Procedure: TOTAL HIP ARTHROPLASTY ANTERIOR APPROACH;  Surgeon: Renette Butters, MD;  Location: [facility];  Service: Orthopedics;  Laterality: Left;    There were no vitals filed for this visit.  Subjective Assessment - 01/04/19 1349    Subjective  Patient arrives without cane as he forgot it in his car. Patient reports his back is doing alright today and his Lt hip is not too bad. He reports he really isn't having hip pain at this time and rates it a 5/10.    Patient is accompained by:  Family member    Wife   Pertinent History  bil THA (2016/2017), L4-S1 fusion 06/16/18, CVA (2017)    Limitations  Walking;Standing;Lifting;Sitting;House hold activities    Patient Stated Goals  to improve balance and strength to improve independence with mobility and reduce fall risk    Currently in Pain?  Yes    Pain Score  5     Pain Location  Hip    Pain Orientation  Left;Lateral    Pain Descriptors / Indicators  Aching    Pain Type  Chronic pain    Pain Onset  More than a month ago    Pain Frequency  Intermittent    Aggravating Factors   walking. hip flexion    Pain Relieving Factors  rest    Effect of Pain on Daily Activities  moderate        [facility] Adult PT Treatment/Exercise - 01/04/19 0001      Lumbar Exercises: Stretches   Single Knee to Chest Stretch  Left;Right;5 reps;10 seconds    Single Knee to Chest Stretch Limitations  towel assistance    Piriformis Stretch  Left;2 reps;30 seconds    Piriformis Stretch Limitations  seated figure four stretch      Lumbar Exercises: Supine   Ab Set  10 reps;3 seconds;Limitations    AB Set Limitations  2 sets    Bridge  10 reps;3 seconds    Bridge Limitations  2 sets      Lumbar Exercises: Sidelying   Clam  Both;10 reps;3 seconds    Clam Limitations  2 sets on Rt (unable to complete second set on Lt, red therand      Manual Therapy   Manual Therapy  Joint mobilization;Soft tissue mobilization    Manual therapy comments  manual therapy completed seperate from other treatment interventions    Joint Mobilization  3x 30-45 seconds lateral glides to Lt hip, grade III/IV for pain relief and mobility. 3x 30-45 seconds lateral glides with internal/external rotation to Lt hip, grade III/IV for pain relief and mobility.    Manual Traction  3x 30 seconds Lt hip distraction for pain relief        PT Education - 01/04/19 1352    Person(s) Educated  Patient    Methods  Explanation    Comprehension  Verbalized understanding       PT Short Term Goals  - 12/16/18 1403      PT SHORT TERM GOAL #1   Title  Patient and spouse to be able to identify 5/5 safety factors in order to reduce fall risk and improve general safety     Baseline  12/16/18:  Reviewed safety factors with pt and wife, verbalized understanding      PT SHORT TERM GOAL #2   Title  Patient to be independent in correctly and consistently performing targeted HEP, to be updated as appropriate, to improve balance and strength for increased safety and independence with mobility.    Baseline  12/16/18:  Pt unable to remember HEP without instructions for form and mechanics.    Status  On-going      PT SHORT TERM GOAL #3   Title  Patient to be able to complete TUG with LRAD in 15 seconds or less in order to show increased mobilty and reduced fall risk     Baseline  12/16/18: TUG 10.33" no AD    Status  Achieved        PT Long Term Goals - 12/16/18 1441      PT LONG TERM GOAL #1   Title  Patient will ambulate at 0.8 m/s with LRAD during 2MWT to demonstrate improved gait/mobiltiy, reduce fall risk, and fall within the community ambulator category demonstrating improve community access.    Baseline  12/09/2018: Able to ambulate 471f with 1 loftstand Rt UE, gait velocity at 1.24714      PT LONG TERM GOAL #2   Title  Patient will demonstrate SLS for 10 seconds on bil LE to indicate improve balance and improve safety with gait and stair negotiation to reduce fall risk.    Baseline  Rt average 17.6", Lt= 13.6"    Status  Partially Met      PT LONG TERM GOAL #3   Title  Patient to be able to independently dress and perform self care based tasks in unsupported standing with no LOB or concern for fall in order to improve general function and QOL .    Baseline  12/16/18:  Wife reports she continues to assist due to Lt hip pain with donn/doffing and some assistance wiht self care.    Status  On-going        Plan - 01/04/19 1353    Clinical Impression Statement  Therapy continues to focus  on hip strengthening and introduced stretches for HEP. Patient able to perform seated piriformis stretch with light overpressure with no difficulty. He demonstrated much improved elevation with bridges this session. During clamshell with resistance he reported increased Lt hip pain and was unable to complete 2nd set. Lt hip distraction and lateral mobilization was continued and resulted in pain relief for patient. Internal/external hip rotation was added to lateral Lt hip glide and patient reported this felt good. He will continue to benefit from skilled PT interventions to address impairments and progress towards goals.    Rehab Potential  Fair    PT Frequency  2x / week    PT Duration  4 weeks    PT Treatment/Interventions  ADLs/Self Care Home Management;Aquatic Therapy;Electrical Stimulation;Iontophoresis 495mml Dexamethasone;Moist Heat;DME Instruction;Gait training;Stair training;Functional mobility training;Therapeutic activities;Neuromuscular re-education;Balance training;Therapeutic exercise;Patient/family education;Manual techniques;Passive range of motion;Taping    PT Next Visit Plan  Review compliance with HEP, add theraband with clams as appropriate for HEP. Progress LE strengthening and balance training. Continue with Lt hip distraction and mobilization for pain control. Introduce functional minisquat with lateral hip glide during.     PT Home Exercise Plan  12/09/18: SKTC, bridge and clam; 12/30/2018 - ab set, pelvic tilt, supine clam w/ ab set; 01/04/2019 - piriformis stretch seated;     Consulted and Agree with Plan of Care  Patient;Family member/caregiver  Family Member Consulted  patient's wife       Patient will benefit from skilled therapeutic intervention in order to improve the following deficits and impairments:  Abnormal gait, Decreased balance, Decreased endurance, Decreased activity tolerance, Decreased mobility, Difficulty walking, Obesity, Improper body mechanics, Pain, Postural  dysfunction, Decreased strength, Decreased knowledge of use of DME, Decreased knowledge of precautions  Visit Diagnosis: Pain in left hip  Chronic low back pain, unspecified back pain laterality, unspecified whether sciatica present  Muscle weakness (generalized)  Difficulty in walking, not elsewhere classified  Other abnormalities of gait and mobility     Problem List Patient Active Problem List   Diagnosis Date Noted  . Abdominal hematoma 08/13/2018  . Radiculopathy 06/16/2018  . Acute CVA (cerebrovascular accident) ([place]) 02/01/2017  . Diabetes mellitus ([place]) 02/01/2017  . HTN (hypertension) 02/01/2017  . Primary osteoarthritis of left hip 01/08/2016  . DJD (degenerative joint disease) 03/20/2015  . Primary osteoarthritis of right hip 02/26/2015  . Lumbar spondylosis 10/17/2014  . Lumbago 07/25/2014  . Abnormality of gait 07/25/2014  . Difficulty in walking(719.7) 07/25/2014  . TIA (transient ischemic attack) 03/09/2013  . Cocaine abuse ([place]) 03/09/2013  . Accelerated hypertension 03/09/2013  . Hyperlipidemia 03/09/2013  . Current smoker 03/09/2013    [name], PT, DPT, [facility] Physical Therapist with [facility]  01/04/2019 2:48 PM    [facility] [address] [place], [place], [address] Phone: [phone]   Fax:  [phone]  Name: Ayub Kirsh. MRN: [id number] Date of Birth: 1960-12-15

## 2019-01-06 ENCOUNTER — Ambulatory Visit (HOSPITAL_COMMUNITY): Payer: Medicaid Other

## 2019-01-06 ENCOUNTER — Telehealth (HOSPITAL_COMMUNITY): Payer: Self-pay

## 2019-01-06 NOTE — Telephone Encounter (Signed)
pt's wife called to cancel the appt due to the weather.

## 2019-01-11 ENCOUNTER — Encounter (HOSPITAL_COMMUNITY): Payer: Self-pay

## 2019-01-11 ENCOUNTER — Ambulatory Visit (HOSPITAL_COMMUNITY): Payer: Medicaid Other

## 2019-01-11 ENCOUNTER — Other Ambulatory Visit: Payer: Self-pay

## 2019-01-11 DIAGNOSIS — R262 Difficulty in walking, not elsewhere classified: Secondary | ICD-10-CM

## 2019-01-11 DIAGNOSIS — M545 Low back pain, unspecified: Secondary | ICD-10-CM

## 2019-01-11 DIAGNOSIS — M25552 Pain in left hip: Secondary | ICD-10-CM | POA: Diagnosis not present

## 2019-01-11 DIAGNOSIS — M6281 Muscle weakness (generalized): Secondary | ICD-10-CM

## 2019-01-11 DIAGNOSIS — R2689 Other abnormalities of gait and mobility: Secondary | ICD-10-CM

## 2019-01-11 DIAGNOSIS — G8929 Other chronic pain: Secondary | ICD-10-CM

## 2019-01-11 NOTE — Therapy (Signed)
[place], [place], [address] Phone: [phone]   Fax:  [phone]  Physical Therapy Treatment  Patient Details  Name: Raymond Barrera. MRN: [id number] Date of Birth: 1961-10-23 Referring Provider (PT): Phylliss Bob, MD   Encounter Date: 01/11/2019  PT End of Session - 01/11/19 1356    Visit Number  7    Number of Visits  17    Date for PT Re-Evaluation  01/27/19   Minireassess 12/16/18   Authorization Type  Medicaid [place] approved 3 visits from 1/1--12/14/2018; 12 units 12/21/18-01/31/19    Authorization Time Period  11/18/18 - 01/28/19    Authorization - Visit Number  4    Authorization - Number of Visits  12    PT Start Time  6962    PT Stop Time  1432    PT Time Calculation (min)  40 min    Equipment Utilized During Treatment  Gait belt    Activity Tolerance  Patient tolerated treatment well    Behavior During Therapy  WFL for tasks assessed/performed       Past Medical History:  Diagnosis Date  . Anxiety    due to the stroke  . Arthritis   . Back pain    hx of buldging disc  . Diabetes mellitus without complication ([place])    takes Metformin daily  . High cholesterol    takes Zocor daily  . Hypertension    takes Benazepril and HCTZ  daily  . Joint pain   . Joint swelling   . Memory impairment    occassional - from stroke  . Myocardial infarction ([place]) 1987  . Pneumonia    hx of-80's  . PONV (postoperative nausea and vomiting)    took a while for him to wake up after previous anesthesia  . Shortness of breath dyspnea    do to pain  . Sleep apnea    never had a sleep study,but states Dr. Cindie Laroche says he has it  . Slurred speech   . Stroke ([place]) 08/2013   7 mini-strokes, last stroke 2017  . TIA (transient ischemic attack) 2014   x 7   . Urinary frequency     Past Surgical History:  Procedure Laterality Date  . ABDOMINAL EXPOSURE N/A 06/16/2018   Procedure: ABDOMINAL EXPOSURE;  Surgeon:  Rosetta Posner, MD;  Location: [facility];  Service: Vascular;  Laterality: N/A;  . ANKLE SURGERY  2008   left ankle-otif-[facility]  . ANTERIOR LUMBAR FUSION Bilateral 06/16/2018   Procedure: LUMBAR 4-5 LUMBAR 5-SACRUM 1 ANTERIOR LUMBAR INTERBODY FUSION WITH INSTRUMENTATION AND ALLOGRAFT;  Surgeon: Phylliss Bob, MD;  Location: [facility];  Service: Orthopedics;  Laterality: Bilateral;  . BACK SURGERY    . HEMATOMA EVACUATION Left 08/13/2018   Procedure: EVACUATION HEMATOMA LEFT ABDOMINAL WALL;  Surgeon: Rosetta Posner, MD;  [facility];  Service: Vascular;  Laterality: Left;  . IR RADIOLOGIST EVAL & MGMT  07/27/2018  . IR US GUIDE BX ASP/DRAIN  07/14/2018  . JOINT REPLACEMENT     both hips replaced   . LUMBAR LAMINECTOMY/DECOMPRESSION MICRODISCECTOMY Right 10/17/2014   Procedure: LUMBAR LAMINECTOMY/DECOMPRESSION MICRODISCECTOMY 2 LEVELS;  Surgeon: [name], MD;  Location: [facility];  Service: Neurosurgery;  Laterality: Right;  Right L45 L5S1 laminectomy and foraminotomy  . MASS EXCISION  09/13/2012   Procedure: EXCISION MASS;  Surgeon: Jamesetta So, MD;  [facility];  Service: General;  Laterality: N/A;  .  TONSILLECTOMY    . TOTAL HIP ARTHROPLASTY Right 03/20/2015  . TOTAL HIP ARTHROPLASTY Right 03/20/2015   Procedure: TOTAL HIP ARTHROPLASTY ANTERIOR APPROACH;  Surgeon: Renette Butters, MD;  Location: [facility];  Service: Orthopedics;  Laterality: Right;  . TOTAL HIP ARTHROPLASTY Left 01/08/2016   Procedure: TOTAL HIP ARTHROPLASTY ANTERIOR APPROACH;  Surgeon: Renette Butters, MD;  Location: [facility];  Service: Orthopedics;  Laterality: Left;    There were no vitals filed for this visit.  Subjective Assessment - 01/11/19 1353    Subjective  Patient reports he and his wife continued to look for an apartment this past weekend without success. He did try his exercises and reports he had some difficluty with the new piriformis stretch. He arrives today without his cane again.     Patient is  accompained by:  Family member   Wife   Pertinent History  bil THA (2016/2017), L4-S1 fusion 06/16/18, CVA (2017)    Limitations  Walking;Standing;Lifting;Sitting;House hold activities    Patient Stated Goals  to improve balance and strength to improve independence with mobility and reduce fall risk    Currently in Pain?  Yes    Pain Score  4     Pain Location  Hip    Pain Orientation  Left;Lateral    Pain Descriptors / Indicators  Aching    Pain Type  Chronic pain    Pain Onset  More than a month ago    Pain Frequency  Intermittent    Aggravating Factors   walking, flexing Lt hip    Pain Relieving Factors  rest    Effect of Pain on Daily Activities  moderate        [facility] Adult PT Treatment/Exercise - 01/11/19 0001      Lumbar Exercises: Stretches   Single Knee to Chest Stretch  Left;Right;5 reps;10 seconds    Single Knee to Chest Stretch Limitations  towel assistance      Lumbar Exercises: Standing   Forward Lunge  10 reps;Limitations    Forward Lunge Limitations  2 sets, Lt LE forward, Blue TB applied for Lat glide/distraction to Lt hip    Other Standing Lumbar Exercises  Functional squats: 2x 10 reps, Blue TB applied for lateral glide/distraction to Lt hip    Other Standing Lumbar Exercises  --   Attempt SLS on Lt LE, 3 way vector, painful, discontinued     Lumbar Exercises: Supine   Bridge  10 reps;3 seconds    Bridge Limitations  2 sets      Lumbar Exercises: Sidelying   Clam  Both;10 reps;3 seconds    Clam Limitations  1 set, clamshell level 1    Other Sidelying Lumbar Exercises  1x 10 reps, Bil LE, reverse clamshell/level 2      Manual Therapy   Manual Therapy  Manual Traction    Manual therapy comments  manual therapy completed seperate from other treatment interventions    Manual Traction  3x 30 seconds Lt hip distraction for pain relief         PT Education - 01/11/19 1355    Education Details  Educated on exercises during session. Educated on progression to  standing exercises for hip mobility.     Person(s) Educated  Patient    Methods  Explanation    Comprehension  Verbalized understanding;Returned demonstration       PT Short Term Goals - 12/16/18 1403      PT SHORT TERM GOAL #1   Title  Patient and spouse to be able to identify 5/5 safety factors in order to reduce fall risk and improve general safety     Baseline  12/16/18: Reviewed safety factors with pt and wife, verbalized understanding      PT SHORT TERM GOAL #2   Title  Patient to be independent in correctly and consistently performing targeted HEP, to be updated as appropriate, to improve balance and strength for increased safety and independence with mobility.    Baseline  12/16/18:  Pt unable to remember HEP without instructions for form and mechanics.    Status  On-going      PT SHORT TERM GOAL #3   Title  Patient to be able to complete TUG with LRAD in 15 seconds or less in order to show increased mobilty and reduced fall risk     Baseline  12/16/18: TUG 10.33" no AD    Status  Achieved        PT Long Term Goals - 12/16/18 1441      PT LONG TERM GOAL #1   Title  Patient will ambulate at 0.8 m/s with LRAD during 2MWT to demonstrate improved gait/mobiltiy, reduce fall risk, and fall within the community ambulator category demonstrating improve community access.    Baseline  12/09/2018: Able to ambulate 414f with 1 loftstand Rt UE, gait velocity at 1.24714      PT LONG TERM GOAL #2   Title  Patient will demonstrate SLS for 10 seconds on bil LE to indicate improve balance and improve safety with gait and stair negotiation to reduce fall risk.    Baseline  Rt average 17.6", Lt= 13.6"    Status  Partially Met      PT LONG TERM GOAL #3   Title  Patient to be able to independently dress and perform self care based tasks in unsupported standing with no LOB or concern for fall in order to improve general function and QOL .    Baseline  12/16/18:  Wife reports she continues to  assist due to Lt hip pain with donn/doffing and some assistance wiht self care.    Status  On-going        Plan - 01/11/19 1356    Clinical Impression Statement  Continued with plan focusing on hip strengthening and mobilization to Lt hip. Patient initiated movement with mobilization exercise during forward lunge and squats. He reported reduced pain and relief during functional strengthening. SLS training was attempted but discontinued when patient reported severe pain in Lt SLS and buckled slightly requiring seated rest break. EOS performed Lt hip distraction manually to relieve pain. He will continue to benefit from skilled PT interventions to address impairments and progress towards goals.    Rehab Potential  Fair    PT Frequency  2x / week    PT Duration  4 weeks    PT Treatment/Interventions  ADLs/Self Care Home Management;Aquatic Therapy;Electrical Stimulation;Iontophoresis '4mg'$ /ml Dexamethasone;Moist Heat;DME Instruction;Gait training;Stair training;Functional mobility training;Therapeutic activities;Neuromuscular re-education;Balance training;Therapeutic exercise;Patient/family education;Manual techniques;Passive range of motion;Taping    PT Next Visit Plan  Continue strengthening and balance training including functional strengthening with mobilization (Lt lateral hip glide). Continue with Lt hip distraction and mobilization for pain control. Introduce functional mini-squat with lateral hip glide during.    PT Home Exercise Plan  12/09/18: SKTC, bridge and clam; 12/30/2018 - ab set, pelvic tilt, supine clam w/ ab set; 01/04/2019 - piriformis stretch seated;     Consulted and Agree with Plan of Care  Patient;Family  member/caregiver    Family Member Consulted  patient's wife       Patient will benefit from skilled therapeutic intervention in order to improve the following deficits and impairments:  Abnormal gait, Decreased balance, Decreased endurance, Decreased activity tolerance, Decreased  mobility, Difficulty walking, Obesity, Improper body mechanics, Pain, Postural dysfunction, Decreased strength, Decreased knowledge of use of DME, Decreased knowledge of precautions  Visit Diagnosis: Pain in left hip  Chronic low back pain, unspecified back pain laterality, unspecified whether sciatica present  Muscle weakness (generalized)  Difficulty in walking, not elsewhere classified  Other abnormalities of gait and mobility     Problem List Patient Active Problem List   Diagnosis Date Noted  . Abdominal hematoma 08/13/2018  . Radiculopathy 06/16/2018  . Acute CVA (cerebrovascular accident) ([place]) 02/01/2017  . Diabetes mellitus ([place]) 02/01/2017  . HTN (hypertension) 02/01/2017  . Primary osteoarthritis of left hip 01/08/2016  . DJD (degenerative joint disease) 03/20/2015  . Primary osteoarthritis of right hip 02/26/2015  . Lumbar spondylosis 10/17/2014  . Lumbago 07/25/2014  . Abnormality of gait 07/25/2014  . Difficulty in walking(719.7) 07/25/2014  . TIA (transient ischemic attack) 03/09/2013  . Cocaine abuse ([place]) 03/09/2013  . Accelerated hypertension 03/09/2013  . Hyperlipidemia 03/09/2013  . Current smoker 03/09/2013    [name], PT, DPT, [facility] Physical Therapist with [facility]  01/11/2019 2:32 PM    [facility] [address] [place], [place], [address] Phone: [phone]   Fax:  [phone]  Name: Codey Burling. MRN: [id number] Date of Birth: 10/15/1961

## 2019-01-13 ENCOUNTER — Ambulatory Visit (HOSPITAL_COMMUNITY): Payer: Medicaid Other | Admitting: Physical Therapy

## 2019-01-13 ENCOUNTER — Encounter (HOSPITAL_COMMUNITY): Payer: Self-pay | Admitting: Physical Therapy

## 2019-01-13 DIAGNOSIS — M25552 Pain in left hip: Secondary | ICD-10-CM

## 2019-01-13 DIAGNOSIS — M545 Low back pain, unspecified: Secondary | ICD-10-CM

## 2019-01-13 DIAGNOSIS — G8929 Other chronic pain: Secondary | ICD-10-CM

## 2019-01-13 DIAGNOSIS — R262 Difficulty in walking, not elsewhere classified: Secondary | ICD-10-CM

## 2019-01-13 DIAGNOSIS — M6281 Muscle weakness (generalized): Secondary | ICD-10-CM

## 2019-01-13 DIAGNOSIS — R2689 Other abnormalities of gait and mobility: Secondary | ICD-10-CM

## 2019-01-13 NOTE — Therapy (Signed)
[place], [place], [address] Phone: [phone]   Fax:  [phone]  Physical Therapy Treatment  Patient Details  Name: Raymond Barrera. MRN: [id number] Date of Birth: [birth year] Referring Provider (PT): Phylliss Bob, MD   Encounter Date: 01/13/2019  PT End of Session - 01/13/19 1351    Visit Number  8    Number of Visits  17    Date for PT Re-Evaluation  01/27/19   Minireassess 12/16/18   Authorization Type  Medicaid [place] approved 3 visits from 1/1--12/14/2018; 12 units 12/21/18-01/31/19    Authorization Time Period  11/18/18 - 01/28/19    Authorization - Visit Number  5    Authorization - Number of Visits  12    PT Start Time  4097    PT Stop Time  1423    PT Time Calculation (min)  38 min    Equipment Utilized During Treatment  Gait belt    Activity Tolerance  Patient tolerated treatment well    Behavior During Therapy  WFL for tasks assessed/performed       Past Medical History:  Diagnosis Date  . Anxiety    due to the stroke  . Arthritis   . Back pain    hx of buldging disc  . Diabetes mellitus without complication ([place])    takes Metformin daily  . High cholesterol    takes Zocor daily  . Hypertension    takes Benazepril and HCTZ  daily  . Joint pain   . Joint swelling   . Memory impairment    occassional - from stroke  . Myocardial infarction ([place]) 1987  . Pneumonia    hx of-80's  . PONV (postoperative nausea and vomiting)    took a while for him to wake up after previous anesthesia  . Shortness of breath dyspnea    do to pain  . Sleep apnea    never had a sleep study,but states Dr. Cindie Laroche says he has it  . Slurred speech   . Stroke ([place]) 08/2013   7 mini-strokes, last stroke 2017  . TIA (transient ischemic attack) 2014   x 7   . Urinary frequency     Past Surgical History:  Procedure Laterality Date  . ABDOMINAL EXPOSURE N/A 06/16/2018   Procedure: ABDOMINAL EXPOSURE;  Surgeon:  Rosetta Posner, MD;  Location: [place];  Service: Vascular;  Laterality: N/A;  . ANKLE SURGERY  2008   left ankle-otif-[facility]  . ANTERIOR LUMBAR FUSION Bilateral 06/16/2018   Procedure: LUMBAR 4-5 LUMBAR 5-SACRUM 1 ANTERIOR LUMBAR INTERBODY FUSION WITH INSTRUMENTATION AND ALLOGRAFT;  Surgeon: Phylliss Bob, MD;  Location: [facility];  Service: Orthopedics;  Laterality: Bilateral;  . BACK SURGERY    . HEMATOMA EVACUATION Left 08/13/2018   Procedure: EVACUATION HEMATOMA LEFT ABDOMINAL WALL;  Surgeon: Rosetta Posner, MD;  [facility];  Service: Vascular;  Laterality: Left;  . IR RADIOLOGIST EVAL & MGMT  07/27/2018  . IR US GUIDE BX ASP/DRAIN  07/14/2018  . JOINT REPLACEMENT     both hips replaced   . LUMBAR LAMINECTOMY/DECOMPRESSION MICRODISCECTOMY Right 10/17/2014   Procedure: LUMBAR LAMINECTOMY/DECOMPRESSION MICRODISCECTOMY 2 LEVELS;  Surgeon: [name], MD;  Location: [place] [facility];  Service: Neurosurgery;  Laterality: Right;  Right L45 L5S1 laminectomy and foraminotomy  . MASS EXCISION  09/13/2012   Procedure: EXCISION MASS;  Surgeon: Jamesetta So, MD;  [facility];  Service: General;  Laterality: N/A;  .  TONSILLECTOMY    . TOTAL HIP ARTHROPLASTY Right 03/20/2015  . TOTAL HIP ARTHROPLASTY Right 03/20/2015   Procedure: TOTAL HIP ARTHROPLASTY ANTERIOR APPROACH;  Surgeon: Renette Butters, MD;  Location: [facility];  Service: Orthopedics;  Laterality: Right;  . TOTAL HIP ARTHROPLASTY Left 01/08/2016   Procedure: TOTAL HIP ARTHROPLASTY ANTERIOR APPROACH;  Surgeon: Renette Butters, MD;  Location: [facility] [place];  Service: Orthopedics;  Laterality: Left;    There were no vitals filed for this visit.  Subjective Assessment - 01/13/19 1350    Subjective  He stated he has been doing his home exercises. Patient reported 6/10 pain in his hip and back and abdomen which he attributed to his surgery.     Patient is accompained by:  Family member   Wife   Pertinent History  bil THA (2016/2017), L4-S1 fusion  06/16/18, CVA (2017)    Limitations  Walking;Standing;Lifting;Sitting;House hold activities    Patient Stated Goals  to improve balance and strength to improve independence with mobility and reduce fall risk    Currently in Pain?  Yes    Pain Score  6     Pain Location  Hip    Pain Orientation  Left    Pain Descriptors / Indicators  Throbbing    Pain Onset  More than a month ago                       [facility] Adult PT Treatment/Exercise - 01/13/19 0001      Lumbar Exercises: Stretches   Single Knee to Chest Stretch  Left;Right;5 reps;10 seconds    Single Knee to Chest Stretch Limitations  towel assistance      Lumbar Exercises: Standing   Forward Lunge  10 reps;Limitations    Forward Lunge Limitations  2 sets, Lt LE forward, Blue TB applied for Lat glide/distraction to Lt hip    Other Standing Lumbar Exercises  Functional squats: 2x 10 reps, Blue TB applied for lateral glide/distraction to Lt hip      Lumbar Exercises: Seated   Other Seated Lumbar Exercises  Seated marching over 1-inch hurdle 2 x10 (attempted with 6'' hurdle initially and was unable to perform with proper form so discontinued)      Lumbar Exercises: Supine   Bridge  10 reps;3 seconds    Bridge Limitations  2 sets      Lumbar Exercises: Sidelying   Clam  Both;10 reps;3 seconds    Clam Limitations  1 set, clamshell level 1    Other Sidelying Lumbar Exercises  1x 10 reps, Bil LE, reverse clamshell/level 2      Manual Therapy   Manual Therapy  Manual Traction    Manual therapy comments  manual therapy completed seperate from other treatment interventions    Manual Traction  3x 30 seconds Lt hip distraction for pain relief with small oscillations               PT Short Term Goals - 12/16/18 1403      PT SHORT TERM GOAL #1   Title  Patient and spouse to be able to identify 5/5 safety factors in order to reduce fall risk and improve general safety     Baseline  12/16/18: Reviewed safety  factors with pt and wife, verbalized understanding      PT SHORT TERM GOAL #2   Title  Patient to be independent in correctly and consistently performing targeted HEP, to be updated as appropriate, to improve balance  and strength for increased safety and independence with mobility.    Baseline  12/16/18:  Pt unable to remember HEP without instructions for form and mechanics.    Status  On-going      PT SHORT TERM GOAL #3   Title  Patient to be able to complete TUG with LRAD in 15 seconds or less in order to show increased mobilty and reduced fall risk     Baseline  12/16/18: TUG 10.33" no AD    Status  Achieved        PT Long Term Goals - 12/16/18 1441      PT LONG TERM GOAL #1   Title  Patient will ambulate at 0.8 m/s with LRAD during 2MWT to demonstrate improved gait/mobiltiy, reduce fall risk, and fall within the community ambulator category demonstrating improve community access.    Baseline  12/09/2018: Able to ambulate 434f with 1 loftstand Rt UE, gait velocity at 1.24714      PT LONG TERM GOAL #2   Title  Patient will demonstrate SLS for 10 seconds on bil LE to indicate improve balance and improve safety with gait and stair negotiation to reduce fall risk.    Baseline  Rt average 17.6", Lt= 13.6"    Status  Partially Met      PT LONG TERM GOAL #3   Title  Patient to be able to independently dress and perform self care based tasks in unsupported standing with no LOB or concern for fall in order to improve general function and QOL .    Baseline  12/16/18:  Wife reports she continues to assist due to Lt hip pain with donn/doffing and some assistance wiht self care.    Status  On-going            Plan - 01/13/19 1426    Clinical Impression Statement  This session continued with established plan of care. Focused on improving patient's left hip mobility this session. Patient required frequent cues and assistance with set-up for exercises throughout the session. This session added  seated stepping over a 1'' hurdle for improved hip flexion strengthening initially trying 6'' hurdle and when patient was unable changing to 1'' hurdles. Ended session with hip distraction for the left hip with small oscillations. Patient reported that his pain was about the same at the end of the session, but that he felt it would ease up throughout the day.     Rehab Potential  Fair    PT Frequency  2x / week    PT Duration  4 weeks    PT Treatment/Interventions  ADLs/Self Care Home Management;Aquatic Therapy;Electrical Stimulation;Iontophoresis 460mml Dexamethasone;Moist Heat;DME Instruction;Gait training;Stair training;Functional mobility training;Therapeutic activities;Neuromuscular re-education;Balance training;Therapeutic exercise;Patient/family education;Manual techniques;Passive range of motion;Taping    PT Next Visit Plan  Continue strengthening and balance training including functional strengthening with mobilization (Lt lateral hip glide). Continue with Lt hip distraction and mobilization for pain control. Introduce functional mini-squat with lateral hip glide during.    PT Home Exercise Plan  12/09/18: SKTC, bridge and clam; 12/30/2018 - ab set, pelvic tilt, supine clam w/ ab set; 01/04/2019 - piriformis stretch seated;     Consulted and Agree with Plan of Care  Patient;Family member/caregiver    Family Member Consulted  patient's wife       Patient will benefit from skilled therapeutic intervention in order to improve the following deficits and impairments:  Abnormal gait, Decreased balance, Decreased endurance, Decreased activity tolerance, Decreased mobility, Difficulty walking, Obesity, Improper  body mechanics, Pain, Postural dysfunction, Decreased strength, Decreased knowledge of use of DME, Decreased knowledge of precautions  Visit Diagnosis: Pain in left hip  Chronic low back pain, unspecified back pain laterality, unspecified whether sciatica present  Muscle weakness  (generalized)  Difficulty in walking, not elsewhere classified  Other abnormalities of gait and mobility     Problem List Patient Active Problem List   Diagnosis Date Noted  . Abdominal hematoma 08/13/2018  . Radiculopathy 06/16/2018  . Acute CVA (cerebrovascular accident) ([place]) 02/01/2017  . Diabetes mellitus ([place]) 02/01/2017  . HTN (hypertension) 02/01/2017  . Primary osteoarthritis of left hip 01/08/2016  . DJD (degenerative joint disease) 03/20/2015  . Primary osteoarthritis of right hip 02/26/2015  . Lumbar spondylosis 10/17/2014  . Lumbago 07/25/2014  . Abnormality of gait 07/25/2014  . Difficulty in walking(719.7) 07/25/2014  . TIA (transient ischemic attack) 03/09/2013  . Cocaine abuse ([place]) 03/09/2013  . Accelerated hypertension 03/09/2013  . Hyperlipidemia 03/09/2013  . Current smoker 03/09/2013   Clarene Critchley PT, DPT 2:30 PM, 01/13/19 [facility] [address] [place], [place], [address] Phone: [phone]   Fax:  [phone]  Name: Raymond Barrera. MRN: [id number] Date of Birth: 10-12-1961

## 2019-01-14 ENCOUNTER — Other Ambulatory Visit (HOSPITAL_COMMUNITY): Payer: Self-pay | Admitting: Urology

## 2019-01-14 ENCOUNTER — Ambulatory Visit (HOSPITAL_COMMUNITY)
Admission: RE | Admit: 2019-01-14 | Discharge: 2019-01-14 | Disposition: A | Discharge status: Home / Self Care | Payer: Medicaid Other | Source: Ambulatory Visit | Attending: Urology | Admitting: Urology

## 2019-01-14 DIAGNOSIS — D4102 Neoplasm of uncertain behavior of left kidney: Secondary | ICD-10-CM

## 2019-01-18 ENCOUNTER — Encounter (HOSPITAL_COMMUNITY): Payer: Self-pay

## 2019-01-18 ENCOUNTER — Other Ambulatory Visit: Payer: Self-pay

## 2019-01-18 ENCOUNTER — Ambulatory Visit (HOSPITAL_COMMUNITY): Payer: Medicaid Other

## 2019-01-18 ENCOUNTER — Other Ambulatory Visit: Payer: Self-pay | Admitting: Urology

## 2019-01-18 DIAGNOSIS — M25552 Pain in left hip: Secondary | ICD-10-CM

## 2019-01-18 DIAGNOSIS — M545 Low back pain, unspecified: Secondary | ICD-10-CM

## 2019-01-18 DIAGNOSIS — M6281 Muscle weakness (generalized): Secondary | ICD-10-CM

## 2019-01-18 DIAGNOSIS — R2689 Other abnormalities of gait and mobility: Secondary | ICD-10-CM

## 2019-01-18 DIAGNOSIS — R262 Difficulty in walking, not elsewhere classified: Secondary | ICD-10-CM

## 2019-01-18 DIAGNOSIS — G8929 Other chronic pain: Secondary | ICD-10-CM

## 2019-01-18 NOTE — Therapy (Signed)
[place], [place], [address] Phone: [phone]   Fax:  [phone]  Physical Therapy Treatment  Patient Details  Name: Raymond Barrera. MRN: [id number] Date of Birth: [birth year] Referring Provider (PT): Phylliss Bob, MD   Encounter Date: 01/18/2019  PT End of Session - 01/18/19 1355    Visit Number  9    Number of Visits  17    Date for PT Re-Evaluation  01/27/19   Minireassess 12/16/18   Authorization Type  Medicaid [place] approved 3 visits from 1/1--12/14/2018; 12 units 12/21/18-01/31/19    Authorization Time Period  11/18/18 - 01/28/19    Authorization - Visit Number  6    Authorization - Number of Visits  12    PT Start Time  1350    PT Stop Time  1432    PT Time Calculation (min)  42 min    Equipment Utilized During Treatment  Gait belt    Activity Tolerance  Patient tolerated treatment well    Behavior During Therapy  WFL for tasks assessed/performed       Past Medical History:  Diagnosis Date  . Anxiety    due to the stroke  . Arthritis   . Back pain    hx of buldging disc  . Diabetes mellitus without complication ([place])    takes Metformin daily  . High cholesterol    takes Zocor daily  . Hypertension    takes Benazepril and HCTZ  daily  . Joint pain   . Joint swelling   . Memory impairment    occassional - from stroke  . Myocardial infarction ([place]) 1987  . Pneumonia    hx of-80's  . PONV (postoperative nausea and vomiting)    took a while for him to wake up after previous anesthesia  . Shortness of breath dyspnea    do to pain  . Sleep apnea    never had a sleep study,but states Dr. Cindie Laroche says he has it  . Slurred speech   . Stroke ([place]) 08/2013   7 mini-strokes, last stroke 2017  . TIA (transient ischemic attack) 2014   x 7   . Urinary frequency     Past Surgical History:  Procedure Laterality Date  . ABDOMINAL EXPOSURE N/A 06/16/2018   Procedure: ABDOMINAL EXPOSURE;  Surgeon:  Rosetta Posner, MD;  Location: [place];  Service: Vascular;  Laterality: N/A;  . ANKLE SURGERY  2008   left ankle-otif-[facility]  . ANTERIOR LUMBAR FUSION Bilateral 06/16/2018   Procedure: LUMBAR 4-5 LUMBAR 5-SACRUM 1 ANTERIOR LUMBAR INTERBODY FUSION WITH INSTRUMENTATION AND ALLOGRAFT;  Surgeon: Phylliss Bob, MD;  Location: [place];  Service: Orthopedics;  Laterality: Bilateral;  . BACK SURGERY    . HEMATOMA EVACUATION Left 08/13/2018   Procedure: EVACUATION HEMATOMA LEFT ABDOMINAL WALL;  Surgeon: Rosetta Posner, MD;  [facility];  Service: Vascular;  Laterality: Left;  . IR RADIOLOGIST EVAL & MGMT  07/27/2018  . IR US GUIDE BX ASP/DRAIN  07/14/2018  . JOINT REPLACEMENT     both hips replaced   . LUMBAR LAMINECTOMY/DECOMPRESSION MICRODISCECTOMY Right 10/17/2014   Procedure: LUMBAR LAMINECTOMY/DECOMPRESSION MICRODISCECTOMY 2 LEVELS;  Surgeon: [name], MD;  Location: [place] [facility];  Service: Neurosurgery;  Laterality: Right;  Right L45 L5S1 laminectomy and foraminotomy  . MASS EXCISION  09/13/2012   Procedure: EXCISION MASS;  Surgeon: Jamesetta So, MD;  [facility];  Service: General;  Laterality: N/A;  .  TONSILLECTOMY    . TOTAL HIP ARTHROPLASTY Right 03/20/2015  . TOTAL HIP ARTHROPLASTY Right 03/20/2015   Procedure: TOTAL HIP ARTHROPLASTY ANTERIOR APPROACH;  Surgeon: Renette Butters, MD;  Location: [facility];  Service: Orthopedics;  Laterality: Right;  . TOTAL HIP ARTHROPLASTY Left 01/08/2016   Procedure: TOTAL HIP ARTHROPLASTY ANTERIOR APPROACH;  Surgeon: Renette Butters, MD;  Location: [place];  Service: Orthopedics;  Laterality: Left;    There were no vitals filed for this visit.  Subjective Assessment - 01/18/19 1354    Subjective  He stated he has been doing his home exercises. Patient reported 6/10 pain in his hip and back and abdomen which he attributed to his surgery.     Patient is accompained by:  Family member   Wife   Pertinent History  bil THA (2016/2017), L4-S1 fusion  06/16/18, CVA (2017)    Limitations  Walking;Standing;Lifting;Sitting;House hold activities    Patient Stated Goals  to improve balance and strength to improve independence with mobility and reduce fall risk    Currently in Pain?  No/denies        [facility] Adult PT Treatment/Exercise - 01/18/19 0001      Lumbar Exercises: Stretches   Single Knee to Chest Stretch  Left;Right;5 reps;10 seconds    Single Knee to Chest Stretch Limitations  towel assistance      Lumbar Exercises: Standing   Heel Raises  15 reps;3 seconds    Heel Raises Limitations  1x 15 reps toe raises, on decline, 3 sec holds    Forward Lunge  10 reps;Limitations    Forward Lunge Limitations  2 sets, Lt LE forward, Blue TB applied for Lat glide/distraction to Lt hip    Other Standing Lumbar Exercises  Functional squats: 2x 10 reps, Blue TB applied for lateral glide/distraction to Lt hip    Other Standing Lumbar Exercises  Hip Extension: 1x 10 reps bil LE, 3 sec holds; Hip Abduction: 1x 10 reps bil LE, 3 sec holds      Manual Therapy   Manual Therapy  Manual Traction;Soft tissue mobilization    Manual therapy comments  manual therapy completed seperate from other treatment interventions    Soft tissue mobilization  IASTM with massage stick to Lt gluteus medius to reduce muscel soreness and restrictions    Manual Traction  4x 30 seconds Lt hip distraction for pain relief with small oscillations        PT Education - 01/18/19 1400    Education Details  Edcuated on purpose of manual interventions and encouraged to perform HEP daily.    Person(s) Educated  Patient    Methods  Explanation    Comprehension  Verbalized understanding;Returned demonstration       PT Short Term Goals - 12/16/18 1403      PT SHORT TERM GOAL #1   Title  Patient and spouse to be able to identify 5/5 safety factors in order to reduce fall risk and improve general safety     Baseline  12/16/18: Reviewed safety factors with pt and wife, verbalized  understanding      PT SHORT TERM GOAL #2   Title  Patient to be independent in correctly and consistently performing targeted HEP, to be updated as appropriate, to improve balance and strength for increased safety and independence with mobility.    Baseline  12/16/18:  Pt unable to remember HEP without instructions for form and mechanics.    Status  On-going      PT  SHORT TERM GOAL #3   Title  Patient to be able to complete TUG with LRAD in 15 seconds or less in order to show increased mobilty and reduced fall risk     Baseline  12/16/18: TUG 10.33" no AD    Status  Achieved        PT Long Term Goals - 12/16/18 1441      PT LONG TERM GOAL #1   Title  Patient will ambulate at 0.8 m/s with LRAD during 2MWT to demonstrate improved gait/mobiltiy, reduce fall risk, and fall within the community ambulator category demonstrating improve community access.    Baseline  12/09/2018: Able to ambulate 426f with 1 loftstand Rt UE, gait velocity at 1.24714      PT LONG TERM GOAL #2   Title  Patient will demonstrate SLS for 10 seconds on bil LE to indicate improve balance and improve safety with gait and stair negotiation to reduce fall risk.    Baseline  Rt average 17.6", Lt= 13.6"    Status  Partially Met      PT LONG TERM GOAL #3   Title  Patient to be able to independently dress and perform self care based tasks in unsupported standing with no LOB or concern for fall in order to improve general function and QOL .    Baseline  12/16/18:  Wife reports she continues to assist due to Lt hip pain with donn/doffing and some assistance wiht self care.    Status  On-going        Plan - 01/18/19 1355    Clinical Impression Statement  Patient performed functional LE strengthening in standing primarily. He continued with lunges and squats with lateral mobilization to Lt hip and denied pain with activity. He continues to experience pain with initiating Lt hip flexion in supine but is able to tolerate Lt hip  flexion passively when close to end range. Soft tissue mobilization performed at EOS for pain relief to Lt gluteus medius as patient reports aching there following exercises. He will continue to benefit from skilled PT interventions to address impairments and improve mobility.    Rehab Potential  Fair    PT Frequency  2x / week    PT Duration  4 weeks    PT Treatment/Interventions  ADLs/Self Care Home Management;Aquatic Therapy;Electrical Stimulation;Iontophoresis '4mg'$ /ml Dexamethasone;Moist Heat;DME Instruction;Gait training;Stair training;Functional mobility training;Therapeutic activities;Neuromuscular re-education;Balance training;Therapeutic exercise;Patient/family education;Manual techniques;Passive range of motion;Taping    PT Next Visit Plan  Continue strengthening and balance training including functional strengthening with mobilization (Lt lateral hip glide). Continue with Lt hip distraction and mobilization for pain control. Progress hip flexion initiation exercise with isometric for Lt hip flexion.    PT Home Exercise Plan  12/09/18: SKTC, bridge and clam; 12/30/2018 - ab set, pelvic tilt, supine clam w/ ab set; 01/04/2019 - piriformis stretch seated;     Consulted and Agree with Plan of Care  Patient    Family Member Consulted  --       Patient will benefit from skilled therapeutic intervention in order to improve the following deficits and impairments:  Abnormal gait, Decreased balance, Decreased endurance, Decreased activity tolerance, Decreased mobility, Difficulty walking, Obesity, Improper body mechanics, Pain, Postural dysfunction, Decreased strength, Decreased knowledge of use of DME, Decreased knowledge of precautions  Visit Diagnosis: Pain in left hip  Chronic low back pain, unspecified back pain laterality, unspecified whether sciatica present  Muscle weakness (generalized)  Difficulty in walking, not elsewhere classified  Other abnormalities of  gait and  mobility     Problem List Patient Active Problem List   Diagnosis Date Noted  . Abdominal hematoma 08/13/2018  . Radiculopathy 06/16/2018  . Acute CVA (cerebrovascular accident) ([place]) 02/01/2017  . Diabetes mellitus ([place]) 02/01/2017  . HTN (hypertension) 02/01/2017  . Primary osteoarthritis of left hip 01/08/2016  . DJD (degenerative joint disease) 03/20/2015  . Primary osteoarthritis of right hip 02/26/2015  . Lumbar spondylosis 10/17/2014  . Lumbago 07/25/2014  . Abnormality of gait 07/25/2014  . Difficulty in walking(719.7) 07/25/2014  . TIA (transient ischemic attack) 03/09/2013  . Cocaine abuse ([place]) 03/09/2013  . Accelerated hypertension 03/09/2013  . Hyperlipidemia 03/09/2013  . Current smoker 03/09/2013    [name], PT, DPT, [facility] Physical Therapist with [facility]  01/18/2019 3:04 PM    [place] [facility] [address] [place], [place], [address] Phone: [phone]   Fax:  [phone]  Name: Raymond Barrera. MRN: [id number] Date of Birth: 01/20/1961

## 2019-01-20 ENCOUNTER — Telehealth (HOSPITAL_COMMUNITY): Payer: Self-pay | Admitting: Family Medicine

## 2019-01-20 ENCOUNTER — Telehealth (HOSPITAL_COMMUNITY): Payer: Self-pay

## 2019-01-20 ENCOUNTER — Ambulatory Visit (HOSPITAL_COMMUNITY): Payer: Medicaid Other

## 2019-01-20 NOTE — Telephone Encounter (Signed)
[facility] has another apptment that they have to attend, called pt back to confrim.

## 2019-01-20 NOTE — Telephone Encounter (Signed)
01/20/19  Wife left a message to [facility] said they had a conflict with another appt today at the same time

## 2019-01-25 ENCOUNTER — Encounter (HOSPITAL_COMMUNITY): Payer: Self-pay

## 2019-01-25 ENCOUNTER — Other Ambulatory Visit: Payer: Self-pay

## 2019-01-25 ENCOUNTER — Ambulatory Visit (HOSPITAL_COMMUNITY): Payer: Medicaid Other

## 2019-01-25 DIAGNOSIS — M545 Low back pain, unspecified: Secondary | ICD-10-CM

## 2019-01-25 DIAGNOSIS — R262 Difficulty in walking, not elsewhere classified: Secondary | ICD-10-CM

## 2019-01-25 DIAGNOSIS — R2689 Other abnormalities of gait and mobility: Secondary | ICD-10-CM

## 2019-01-25 DIAGNOSIS — G8929 Other chronic pain: Secondary | ICD-10-CM

## 2019-01-25 DIAGNOSIS — M25552 Pain in left hip: Secondary | ICD-10-CM

## 2019-01-25 DIAGNOSIS — M6281 Muscle weakness (generalized): Secondary | ICD-10-CM

## 2019-01-25 NOTE — Therapy (Signed)
[place], [place], [address] Phone: [phone]   Fax:  [phone]  Physical Therapy Treatment/Progress Note  Patient Details  Name: Raymond Barrera. MRN: [id number] Date of Birth: [birth year] Referring Provider (PT): Phylliss Bob, MD   Encounter Date: 01/25/2019   Progress Note Reporting Period 12/16/2018 to 01/25/2019  See note below for Objective Data and Assessment of Progress/Goals.    PT End of Session - 01/25/19 1423    Visit Number  10    Number of Visits  17    Date for PT Re-Evaluation  01/27/19   Minireassess 12/16/18   Authorization Type  Medicaid [place] approved 3 visits from 1/1--12/14/2018; 12 units 12/21/18-01/31/19    Authorization Time Period  11/18/18 - 01/28/19    Authorization - Visit Number  7    Authorization - Number of Visits  12    PT Start Time  1351    PT Stop Time  1432    PT Time Calculation (min)  41 min    Equipment Utilized During Treatment  Gait belt    Activity Tolerance  Patient tolerated treatment well    Behavior During Therapy  WFL for tasks assessed/performed       Past Medical History:  Diagnosis Date  . Anxiety    due to the stroke  . Arthritis   . Back pain    hx of buldging disc  . Diabetes mellitus without complication ([place])    takes Metformin daily  . High cholesterol    takes Zocor daily  . Hypertension    takes Benazepril and HCTZ  daily  . Joint pain   . Joint swelling   . Memory impairment    occassional - from stroke  . Myocardial infarction ([place]) 1987  . Pneumonia    hx of-80's  . PONV (postoperative nausea and vomiting)    took a while for him to wake up after previous anesthesia  . Shortness of breath dyspnea    do to pain  . Sleep apnea    never had a sleep study,but states Dr. Cindie Laroche says he has it  . Slurred speech   . Stroke ([place]) 08/2013   7 mini-strokes, last stroke 2017  . TIA (transient ischemic attack) 2014   x 7   . Urinary  frequency     Past Surgical History:  Procedure Laterality Date  . ABDOMINAL EXPOSURE N/A 06/16/2018   Procedure: ABDOMINAL EXPOSURE;  Surgeon: Rosetta Posner, MD;  Location: [facility];  Service: Vascular;  Laterality: N/A;  . ANKLE SURGERY  2008   left ankle-otif-[facility]  . ANTERIOR LUMBAR FUSION Bilateral 06/16/2018   Procedure: LUMBAR 4-5 LUMBAR 5-SACRUM 1 ANTERIOR LUMBAR INTERBODY FUSION WITH INSTRUMENTATION AND ALLOGRAFT;  Surgeon: Phylliss Bob, MD;  Location: [facility];  Service: Orthopedics;  Laterality: Bilateral;  . BACK SURGERY    . HEMATOMA EVACUATION Left 08/13/2018   Procedure: EVACUATION HEMATOMA LEFT ABDOMINAL WALL;  Surgeon: Rosetta Posner, MD;  [facility];  Service: Vascular;  Laterality: Left;  . IR RADIOLOGIST EVAL & MGMT  07/27/2018  . IR US GUIDE BX ASP/DRAIN  07/14/2018  . JOINT REPLACEMENT     both hips replaced   . LUMBAR LAMINECTOMY/DECOMPRESSION MICRODISCECTOMY Right 10/17/2014   Procedure: LUMBAR LAMINECTOMY/DECOMPRESSION MICRODISCECTOMY 2 LEVELS;  Surgeon: [name], MD;  Location: [facility];  Service: Neurosurgery;  Laterality: Right;  Right L45 L5S1 laminectomy and foraminotomy  . MASS EXCISION  09/13/2012  Procedure: EXCISION MASS;  Surgeon: Jamesetta So, MD;  Location: [facility];  Service: General;  Laterality: N/A;  . TONSILLECTOMY    . TOTAL HIP ARTHROPLASTY Right 03/20/2015  . TOTAL HIP ARTHROPLASTY Right 03/20/2015   Procedure: TOTAL HIP ARTHROPLASTY ANTERIOR APPROACH;  Surgeon: Renette Butters, MD;  Location: [facility];  Service: Orthopedics;  Laterality: Right;  . TOTAL HIP ARTHROPLASTY Left 01/08/2016   Procedure: TOTAL HIP ARTHROPLASTY ANTERIOR APPROACH;  Surgeon: Renette Butters, MD;  Location: [facility];  Service: Orthopedics;  Laterality: Left;    There were no vitals filed for this visit.  Subjective Assessment - 01/25/19 1355    Subjective  Patient reports his pain is not too bad today and is around 5/10 currently. He reports it is still the worst  at night, and that he is still using his medication which helps some. He reports his pain doctor talked about putting a device that has a battery behind his ear to help with his hip pain but was unable to name his doctor or describe the device further.  He states his pain management doctor requested that he continue with physical therapy.    Patient is accompained by:  Family member   Wife   Pertinent History  bil THA (2016/2017), L4-S1 fusion 06/16/18, CVA (2017)    Limitations  Walking;Standing;Lifting;Sitting;House hold activities    Patient Stated Goals  to improve balance and strength to improve independence with mobility and reduce fall risk    Currently in Pain?  Yes    Pain Score  5     Pain Location  Hip    Pain Orientation  Left    Pain Descriptors / Indicators  Aching    Pain Type  Chronic pain    Pain Onset  More than a month ago    Pain Frequency  Intermittent    Aggravating Factors   lifting Lt hip, walking    Pain Relieving Factors  rest, medicine    Effect of Pain on Daily Activities  moderate         [facility] PT Assessment - 01/25/19 0001      Assessment   Medical Diagnosis  Low Back Pain and Hip Pain    Referring Provider (PT)  Phylliss Bob, MD    Hand Dominance  Right    Next MD Visit  02/04/19 with Dr. Cindie Laroche    Prior Therapy  yes for multiple conditions      Precautions   Precautions  None      Restrictions   Weight Bearing Restrictions  No      Single Leg Stance   Comments  Rt = 30", Lt = 12"      ROM / Strength   AROM / PROM / Strength  AROM      AROM   AROM Assessment Site  Lumbar    Lumbar Flexion  WFL    Lumbar Extension  WFL    Lumbar - Right Side Bend  15 degrees    Lumbar - Left Side Bend  10 degrees    Lumbar - Right Rotation  50% limited    Lumbar - Left Rotation  25% limited      Strength   Right Hip Flexion  4+/5    Right Hip Extension  4/5    Right Hip ABduction  4/5    Left Hip Flexion  2+/5    Left Hip Extension  4/5    Left  Hip ABduction  2+/5    Right Knee Flexion  4+/5    Left Knee Flexion  4/5    Left Knee Extension  4/5    Right Ankle Dorsiflexion  5/5    Left Ankle Dorsiflexion  5/5      Ambulation/Gait   Ambulation Distance (Feet)  412 Feet   2MWT   Assistive device  Straight cane    Gait velocity  1.03 m/s      Standardized Balance Assessment   Standardized Balance Assessment  Five Times Sit to Stand    Five times sit to stand comments   19.9 seconds without UE use       [facility] Adult PT Treatment/Exercise - 01/25/19 0001      Lumbar Exercises: Standing   Forward Lunge  10 reps;Limitations    Forward Lunge Limitations  at counter for support, 1 set      Manual Therapy   Manual Therapy  Manual Traction    Manual therapy comments  manual therapy completed seperate from other treatment interventions    Manual Traction  3x 30 seconds Lt hip distraction for pain relief with small oscillations        PT Education - 01/25/19 1601    Education Details  Educated on re-assessment findings and on plan to continue with therapy and will request more visits for additional 3 weeks. Educated on exercises for updated HEP.    Person(s) Educated  Patient    Methods  Explanation;Handout    Comprehension  Verbalized understanding;Returned demonstration       PT Short Term Goals - 01/25/19 1614      PT SHORT TERM GOAL #1   Title  Patient and spouse to be able to identify 5/5 safety factors in order to reduce fall risk and improve general safety     Baseline  12/16/18: Reviewed safety factors with pt and wife, verbalized understanding    Status  Partially Met      PT SHORT TERM GOAL #2   Title  Patient to be independent in correctly and consistently performing targeted HEP, to be updated as appropriate, to improve balance and strength for increased safety and independence with mobility.    Baseline  12/16/18:  Pt unable to remember HEP without instructions for form and mechanics.    Status  Partially Met       PT SHORT TERM GOAL #3   Title  Patient to be able to complete TUG with LRAD in 15 seconds or less in order to show increased mobilty and reduced fall risk     Baseline  12/16/18: TUG 10.33" no AD    Status  Achieved        PT Long Term Goals - 01/25/19 1409      PT LONG TERM GOAL #1   Title  Patient will ambulate at 0.8 m/s with LRAD during 2MWT to demonstrate improved gait/mobiltiy, reduce fall risk, and fall within the community ambulator category demonstrating improve community access.    Baseline  12/09/2018: Able to ambulate 472f with 1 loftstand Rt UE, gait velocity at 1.24714; 01/25/19 = 412 feet    Status  Achieved      PT LONG TERM GOAL #2   Title  Patient will demonstrate SLS for 10 seconds on bil LE to indicate improve balance and improve safety with gait and stair negotiation to reduce fall risk.    Baseline  Rt average 30", Lt= 12"    Status  Achieved  PT LONG TERM GOAL #3   Title  Patient to be able to independently dress and perform self care based tasks in unsupported standing with no LOB or concern for fall in order to improve general function and QOL .    Baseline  12/16/18:  Wife reports she continues to assist due to Lt hip pain with donn/doffing and some assistance wiht self care.    Status  On-going        Plan - 01/25/19 1612    Clinical Impression Statement  Re-assessment performed today and patient has made continued progress towards goals and improve LE strength with exception to Lt hip flexion which decreased slightly. He has made improvement in Bil LE balance with SLS and performed Rt LE SLS for 30 seconds and Lt LE for 12 seconds. He is ambulating at 1.0 m/s with SPC indicating he is functioning at a typical/normal community ambulator. Reviewed progress with patient and updated HEP with functional strengthening. Discussed plan to continue for additional 3 weeks to progress LE strength further. EOS manual Lt hip distraction performed for pain relief. He  will continue to benefit from skilled PT interventions to address impairments and improve mobility.    Rehab Potential  Fair    PT Frequency  2x / week    PT Duration  4 weeks    PT Treatment/Interventions  ADLs/Self Care Home Management;Aquatic Therapy;Electrical Stimulation;Iontophoresis '4mg'$ /ml Dexamethasone;Moist Heat;DME Instruction;Gait training;Stair training;Functional mobility training;Therapeutic activities;Neuromuscular re-education;Balance training;Therapeutic exercise;Patient/family education;Manual techniques;Passive range of motion;Taping    PT Next Visit Plan  Update HEP for functional strengthening. Continue strengthening and balance training including functional strengthening with mobilization (Lt lateral hip glide). Continue with Lt hip distraction and mobilization for pain control. Progress hip flexion initiation exercise with isometric for Lt hip flexion.     PT Home Exercise Plan  12/09/18: SKTC, bridge and clam; 12/30/2018 - ab set, pelvic tilt, supine clam w/ ab set; 01/04/2019 - piriformis stretch seated;     Consulted and Agree with Plan of Care  Patient       Patient will benefit from skilled therapeutic intervention in order to improve the following deficits and impairments:  Abnormal gait, Decreased balance, Decreased endurance, Decreased activity tolerance, Decreased mobility, Difficulty walking, Obesity, Improper body mechanics, Pain, Postural dysfunction, Decreased strength, Decreased knowledge of use of DME, Decreased knowledge of precautions  Visit Diagnosis: Pain in left hip  Chronic low back pain, unspecified back pain laterality, unspecified whether sciatica present  Muscle weakness (generalized)  Difficulty in walking, not elsewhere classified  Other abnormalities of gait and mobility     Problem List Patient Active Problem List   Diagnosis Date Noted  . Abdominal hematoma 08/13/2018  . Radiculopathy 06/16/2018  . Acute CVA (cerebrovascular accident)  ([place]) 02/01/2017  . Diabetes mellitus ([place]) 02/01/2017  . HTN (hypertension) 02/01/2017  . Primary osteoarthritis of left hip 01/08/2016  . DJD (degenerative joint disease) 03/20/2015  . Primary osteoarthritis of right hip 02/26/2015  . Lumbar spondylosis 10/17/2014  . Lumbago 07/25/2014  . Abnormality of gait 07/25/2014  . Difficulty in walking(719.7) 07/25/2014  . TIA (transient ischemic attack) 03/09/2013  . Cocaine abuse ([place]) 03/09/2013  . Accelerated hypertension 03/09/2013  . Hyperlipidemia 03/09/2013  . Current smoker 03/09/2013    [name], PT, DPT, [facility] Physical Therapist with [facility]  01/25/2019 4:13 PM    [place] [address] [place], [place], [address] Phone: [phone]   Fax:  [phone]  Name: Raymond Barrera. MRN: [id number] Date of Birth: 11-Sep-1961

## 2019-01-27 ENCOUNTER — Other Ambulatory Visit: Payer: Self-pay

## 2019-01-27 ENCOUNTER — Emergency Department (HOSPITAL_COMMUNITY)
Admission: EM | Admit: 2019-01-27 | Discharge: 2019-01-27 | Disposition: A | Discharge status: Home / Self Care | Payer: Medicaid Other | Attending: Emergency Medicine | Admitting: Emergency Medicine

## 2019-01-27 ENCOUNTER — Ambulatory Visit (HOSPITAL_COMMUNITY): Payer: Medicaid Other

## 2019-01-27 ENCOUNTER — Emergency Department (HOSPITAL_COMMUNITY): Payer: Medicaid Other

## 2019-01-27 ENCOUNTER — Encounter (HOSPITAL_COMMUNITY): Payer: Self-pay | Admitting: Emergency Medicine

## 2019-01-27 ENCOUNTER — Telehealth (HOSPITAL_COMMUNITY): Payer: Self-pay

## 2019-01-27 DIAGNOSIS — E119 Type 2 diabetes mellitus without complications: Secondary | ICD-10-CM | POA: Insufficient documentation

## 2019-01-27 DIAGNOSIS — M6281 Muscle weakness (generalized): Secondary | ICD-10-CM

## 2019-01-27 DIAGNOSIS — I10 Essential (primary) hypertension: Secondary | ICD-10-CM | POA: Insufficient documentation

## 2019-01-27 DIAGNOSIS — E785 Hyperlipidemia, unspecified: Secondary | ICD-10-CM | POA: Diagnosis not present

## 2019-01-27 DIAGNOSIS — G8929 Other chronic pain: Secondary | ICD-10-CM

## 2019-01-27 DIAGNOSIS — M5442 Lumbago with sciatica, left side: Secondary | ICD-10-CM | POA: Diagnosis not present

## 2019-01-27 DIAGNOSIS — Z79899 Other long term (current) drug therapy: Secondary | ICD-10-CM | POA: Diagnosis not present

## 2019-01-27 DIAGNOSIS — M25552 Pain in left hip: Secondary | ICD-10-CM

## 2019-01-27 DIAGNOSIS — F17210 Nicotine dependence, cigarettes, uncomplicated: Secondary | ICD-10-CM | POA: Insufficient documentation

## 2019-01-27 DIAGNOSIS — M545 Low back pain, unspecified: Secondary | ICD-10-CM

## 2019-01-27 DIAGNOSIS — R262 Difficulty in walking, not elsewhere classified: Secondary | ICD-10-CM

## 2019-01-27 DIAGNOSIS — R2689 Other abnormalities of gait and mobility: Secondary | ICD-10-CM

## 2019-01-27 MED ORDER — HYDROMORPHONE HCL 1 MG/ML IJ SOLN
1.0000 mg | Freq: Once | INTRAMUSCULAR | Status: AC
  Administered 2019-01-27: 1 mg via INTRAMUSCULAR
  Filled 2019-01-27: qty 1

## 2019-01-27 MED ORDER — ONDANSETRON 8 MG PO TBDP
8.0000 mg | ORAL_TABLET | Freq: Once | ORAL | Status: AC
  Administered 2019-01-27: 8 mg via ORAL
  Filled 2019-01-27: qty 1

## 2019-01-27 NOTE — Telephone Encounter (Signed)
I called Mr. Raymond Barrera on his phone number on file. I left a message to inform him that I spoke with Raymond Barrera's office to discuss setting an appointment up to follow up on his Lt hip pain. His office informed me that he will need to get a referral from the physician listed on his Medicaid card in order to set an appointment up.   [name], PT, DPT, [facility] Physical Therapist with [facility]  01/27/2019 3:39 PM

## 2019-01-27 NOTE — Therapy (Signed)
[place], [place], [address] Phone: [phone]   Fax:  [phone]  Patient Details  Name: Raymond Barrera. MRN: [id number] Date of Birth: [birth year] Referring Provider:  Lucia Gaskins, MD  Encounter Date: 01/27/2019   Patient arrived with reports of 10+/10 pain and he and his wife reported he has been unable to sleep due to the pain. Patient's wife stated she feels he may need to go to the ED for imaging to his Rt hip and pain management. Approximately 15 minutes spent discussing patient and spouses concerns and I discussed his recent MD follow ups with the patient. He reportedly has not returned to Dr. Maretta Los in ~ 2 years since having Lt THA performed 3 years ago. I discussed that I feel he needs to follow up with Dr. Maretta Los at this time to have new imaging and possibly an MRI as hip flexion/abduction activation are the primary aggravators and he may have soft tissue involvement. I informed them we will discharge him from this episode of physical therapy at this time until he is able to follow up with Dr. Maretta Los and encouraged them that if they feel he needs emergency medical care to seek it at this time. I offered to call for EMS transportation but [name] declined and his wife stated she would take him herself.    [name], PT, DPT, [facility] Physical Therapist with [facility]  01/27/2019 2:02 PM    [place], [place], [address] Phone: [phone]   Fax:  [phone]

## 2019-01-27 NOTE — ED Triage Notes (Signed)
LT hip pain x 3 1/2 weeks that has continued to get worse.  Pt was in PT recently but had to stop d/t pain

## 2019-01-27 NOTE — Discharge Instructions (Addendum)
You may apply ice and heat to your lower back.  Be sure to follow-up with your primary doctor or with your surgeon.

## 2019-01-27 NOTE — ED Notes (Signed)
Pt transported to xray 

## 2019-01-29 NOTE — ED Provider Notes (Signed)
[facility] EMERGENCY DEPARTMENT Provider Note   CSN: [id number] Arrival date & time: 01/27/19  1410    History   Chief Complaint Chief Complaint  Patient presents with  . Hip Pain    HPI Raymond Corbello. is a 58 y.o. male.     HPI   Raymond Scheff. is a 58 y.o. male with a history of chronic low back pain and left hip pain, presents to the Emergency Department complaining of worsening of his chronic left hip pain and low back pain.  He states symptoms have been worsening for 3-1/2 weeks.  He describes a sharp stabbing type pain from his left hip that radiates down his leg.  Pain is constant, but worsens with attempted weightbearing.  He states that he has had pain to his left hip ever since receiving total hip arthroplasty in 2017.  He states that he takes gabapentin and oxycodone for his chronic pain, but states the medication has not been helping.  He is recently been unable to continue his PT due to his level of pain.  He denies recent injury, abdominal pain, urine or bowel changes, fever, chills, numbness or weakness of the lower extremities.    Past Medical History:  Diagnosis Date  . Anxiety    due to the stroke  . Arthritis   . Back pain    hx of buldging disc  . Diabetes mellitus without complication ([place])    takes Metformin daily  . High cholesterol    takes Zocor daily  . Hypertension    takes Benazepril and HCTZ  daily  . Joint pain   . Joint swelling   . Memory impairment    occassional - from stroke  . Myocardial infarction ([place]) 1987  . Pneumonia    hx of-80's  . PONV (postoperative nausea and vomiting)    took a while for him to wake up after previous anesthesia  . Shortness of breath dyspnea    do to pain  . Sleep apnea    never had a sleep study,but states Dr. Cindie Laroche says he has it  . Slurred speech   . Stroke ([place]) 08/2013   7 mini-strokes, last stroke 2017  . TIA (transient ischemic attack) 2014   x 7   . Urinary frequency      Patient Active Problem List   Diagnosis Date Noted  . Abdominal hematoma 08/13/2018  . Radiculopathy 06/16/2018  . Acute CVA (cerebrovascular accident) ([place]) 02/01/2017  . Diabetes mellitus ([place]) 02/01/2017  . HTN (hypertension) 02/01/2017  . Primary osteoarthritis of left hip 01/08/2016  . DJD (degenerative joint disease) 03/20/2015  . Primary osteoarthritis of right hip 02/26/2015  . Lumbar spondylosis 10/17/2014  . Lumbago 07/25/2014  . Abnormality of gait 07/25/2014  . Difficulty in walking(719.7) 07/25/2014  . TIA (transient ischemic attack) 03/09/2013  . Cocaine abuse ([place]) 03/09/2013  . Accelerated hypertension 03/09/2013  . Hyperlipidemia 03/09/2013  . Current smoker 03/09/2013    Past Surgical History:  Procedure Laterality Date  . ABDOMINAL EXPOSURE N/A 06/16/2018   Procedure: ABDOMINAL EXPOSURE;  Surgeon: Rosetta Posner, MD;  Location: [facility];  Service: Vascular;  Laterality: N/A;  . ANKLE SURGERY  2008   left ankle-otif-[facility]  . ANTERIOR LUMBAR FUSION Bilateral 06/16/2018   Procedure: LUMBAR 4-5 LUMBAR 5-SACRUM 1 ANTERIOR LUMBAR INTERBODY FUSION WITH INSTRUMENTATION AND ALLOGRAFT;  Surgeon: Phylliss Bob, MD;  Location: [facility];  Service: Orthopedics;  Laterality: Bilateral;  . BACK SURGERY    .  HEMATOMA EVACUATION Left 08/13/2018   Procedure: EVACUATION HEMATOMA LEFT ABDOMINAL WALL;  Surgeon: Rosetta Posner, MD;  Location: [facility];  Service: Vascular;  Laterality: Left;  . IR RADIOLOGIST EVAL & MGMT  07/27/2018  . IR US GUIDE BX ASP/DRAIN  07/14/2018  . JOINT REPLACEMENT     both hips replaced   . LUMBAR LAMINECTOMY/DECOMPRESSION MICRODISCECTOMY Right 10/17/2014   Procedure: LUMBAR LAMINECTOMY/DECOMPRESSION MICRODISCECTOMY 2 LEVELS;  Surgeon: [name], MD;  Location: [facility];  Service: Neurosurgery;  Laterality: Right;  Right L45 L5S1 laminectomy and foraminotomy  . MASS EXCISION  09/13/2012   Procedure: EXCISION MASS;  Surgeon: Jamesetta So, MD;   Location: [facility];  Service: General;  Laterality: N/A;  . TONSILLECTOMY    . TOTAL HIP ARTHROPLASTY Right 03/20/2015  . TOTAL HIP ARTHROPLASTY Right 03/20/2015   Procedure: TOTAL HIP ARTHROPLASTY ANTERIOR APPROACH;  Surgeon: Renette Butters, MD;  Location: [facility];  Service: Orthopedics;  Laterality: Right;  . TOTAL HIP ARTHROPLASTY Left 01/08/2016   Procedure: TOTAL HIP ARTHROPLASTY ANTERIOR APPROACH;  Surgeon: Renette Butters, MD;  Location: [place];  Service: Orthopedics;  Laterality: Left;        Home Medications    Prior to Admission medications   Medication Sig Start Date End Date Taking? Authorizing Provider  amLODipine (NORVASC) 10 MG tablet Take 10 mg by mouth daily.    [provider]  Ascorbic Acid (VITAMIN C) 1000 MG tablet Take 1,000 mg by mouth daily.    [provider]  aspirin 81 MG tablet Take 1 tablet (81 mg total) by mouth daily. 06/14/18   Croitoru, Mihai, MD  diazepam (VALIUM) 5 MG tablet 1 po tid for spasm pain Patient taking differently: Take 5 mg by mouth every 8 (eight) hours as needed. 1 po tid for spasm pain 10/15/18   Lily Kocher, PA-C  folic acid (FOLVITE) 650 MCG tablet Take 400 mcg by mouth daily.    [provider]  gabapentin (NEURONTIN) 300 MG capsule Take 300 mg by mouth 3 (three) times daily.    [provider]  glucose 4 GM chewable tablet Chew 1 tablet by mouth as needed for low blood sugar.    [provider]  HYDROcodone-acetaminophen (NORCO/VICODIN) 5-325 MG tablet Take 1 tablet by mouth every 4 (four) hours as needed. 10/15/18   Lily Kocher, PA-C  labetalol (NORMODYNE) 200 MG tablet Take 200 mg by mouth 2 (two) times daily.    [provider]  metFORMIN (GLUCOPHAGE) 500 MG tablet Take 500 mg by mouth 2 (two) times daily with a meal.    [provider]  methocarbamol (ROBAXIN) 500 MG tablet Take 2 tablets (1,000 mg total) by mouth 4 (four) times daily as needed for muscle spasms (muscle  spasm/pain). 10/24/18   Francine Graven, DO  Multiple Vitamin (MULTIVITAMIN WITH MINERALS) TABS tablet Take 1 tablet by mouth daily.    [provider]  Omega-3 Fatty Acids (FISH OIL) 1000 MG CAPS Take 1,000 mg by mouth daily.    [provider]  oxyCODONE-acetaminophen (PERCOCET/ROXICET) 5-325 MG tablet 1 or 2 tabs PO q8h prn pain Patient not taking: Reported on 11/18/2018 10/24/18   Francine Graven, DO  sacubitril-valsartan (ENTRESTO) 24-26 MG Take 1 tablet by mouth 2 (two) times daily. Patient not taking: Reported on 11/18/2018 11/11/18   Croitoru, Mihai, MD  simvastatin (ZOCOR) 40 MG tablet Take 40 mg by mouth daily.    [provider]  vitamin E 200 UNIT  capsule Take 200 Units by mouth daily.    [provider]    Family History Family History  Problem Relation Age of Onset  . Heart disease Father     Social History Social History   Tobacco Use  . Smoking status: Current Every Day Smoker    Packs/day: 0.25    Years: 47.00    Pack years: 11.75    Types: Cigarettes  . Smokeless tobacco: Never Used  . Tobacco comment: Less thant 1/4 of a pack.   Substance Use Topics  . Alcohol use: No    Comment: quit 2012  . Drug use: Not Currently    Types: Cocaine    Comment: many yrs ago., last time- late 2016     Allergies   Poison ivy extract [poison ivy extract]   Review of Systems Review of Systems  Constitutional: Negative for fever.  Respiratory: Negative for shortness of breath.   Cardiovascular: Negative for chest pain and leg swelling.  Gastrointestinal: Negative for abdominal pain, diarrhea and vomiting.  Genitourinary: Negative for decreased urine volume, difficulty urinating, dysuria, flank pain and hematuria.  Musculoskeletal: Positive for arthralgias (Left hip pain) and back pain. Negative for joint swelling.  Skin: Negative for rash.  Neurological: Negative for weakness and numbness.     Physical Exam Updated Vital  Signs BP (!) 156/112 (BP Location: Right Arm)   Pulse 70   Temp (!) 97.5 F (36.4 C) (Oral)   Resp 17   Ht 6\' 6"  (1.981 m)   Wt 119.7 kg   SpO2 94%   BMI 30.51 kg/m   Physical Exam Vitals signs and nursing note reviewed.  Constitutional:      General: He is not in acute distress.    Appearance: He is well-developed.  HENT:     Head: Normocephalic and atraumatic.  Neck:     Musculoskeletal: Normal range of motion and neck supple.  Cardiovascular:     Rate and Rhythm: Normal rate and regular rhythm.     Comments: DP pulses are strong and palpable bilaterally Pulmonary:     Effort: Pulmonary effort is normal. No respiratory distress.     Breath sounds: Normal breath sounds.  Abdominal:     General: There is no distension.     Palpations: Abdomen is soft.     Tenderness: There is no abdominal tenderness.  Musculoskeletal:        General: Tenderness present.     Lumbar back: He exhibits tenderness and pain. He exhibits normal range of motion, no swelling, no deformity, no laceration and normal pulse.     Comments: ttp of the lower midline lumbar pain and left lumbar paraspinal muscles.  No bony step-offs.  pt has 5/5 strength against resistance of bilateral lower extremities.  Positive straight leg raise on the left at 15 degrees.  Negative straight leg raise on the right.   Skin:    General: Skin is warm and dry.     Capillary Refill: Capillary refill takes less than 2 seconds.     Findings: No rash.  Neurological:     Mental Status: He is alert and oriented to person, place, and time.     Sensory: No sensory deficit.     Motor: No abnormal muscle tone.     Coordination: Coordination normal.     Gait: Gait normal.     Deep Tendon Reflexes:     Reflex Scores:      Patellar reflexes are 2+ on the  right side and 2+ on the left side.      Achilles reflexes are 2+ on the right side and 2+ on the left side.     ED Treatments / Results  Labs (all labs ordered are listed,  but only abnormal results are displayed) Labs Reviewed - No data to display  EKG None  Radiology No results found.  Procedures Procedures (including critical care time)  Medications Ordered in ED Medications  HYDROmorphone (DILAUDID) injection 1 mg (1 mg Intramuscular Given 01/27/19 1623)  ondansetron (ZOFRAN-ODT) disintegrating tablet 8 mg (8 mg Oral Given 01/27/19 1623)     Initial Impression / Assessment and Plan / ED Course  I have reviewed the triage vital signs and the nursing notes.  Pertinent labs & imaging results that were available during my care of the patient were reviewed by me and considered in my medical decision making (see chart for details).        Patient with likely acute on chronic pain and left-sided sciatica.  He is otherwise well-appearing and nontoxic.  No focal neuro deficits on exam.  No concerning symptoms for cauda equina, no recent spinal injections to suggest spinal abscess.  Patient was reviewed on the narcotics database.  He is hypertensive here, but states he has not taken all of his daily medication.  He denies any symptoms other than low back and hip pain.  He reports feeling better after injection of pain medication.  I feel that he is appropriate for discharge home, he agrees to arrange follow-up with his orthopedic provider.  Strict return precautions were discussed.  Final Clinical Impressions(s) / ED Diagnoses   Final diagnoses:  Chronic left-sided low back pain with left-sided sciatica    ED Discharge Orders    None       Kem Parkinson, PA-C 01/29/19 1704    Elnora Morrison, MD 01/30/19 [phone]

## 2019-02-11 ENCOUNTER — Ambulatory Visit (HOSPITAL_COMMUNITY): Payer: Medicaid Other

## 2019-02-14 ENCOUNTER — Telehealth (HOSPITAL_COMMUNITY): Payer: Self-pay

## 2019-02-14 NOTE — Telephone Encounter (Signed)
I called the mobile phone number listed on file for Raymond Barrera and he and his wife both answered the phone on speaker phone. I discussed with them that in order to return to physical therapy he would need to see Dr. Ignacia Palma about his hip as he has not followed up with him in quite some time. To do this they will need to see the patient's PCP Dr. Cindie Laroche and get a referral to Dr. Maretta Los. [name] and his wife both verbalized their understanding.   Raymond Barrera, PT, DPT, [facility] Physical Therapist with [facility]  02/14/2019 5:03 PM

## 2019-02-17 ENCOUNTER — Ambulatory Visit: Payer: Self-pay | Admitting: Orthopedic Surgery

## 2019-02-22 ENCOUNTER — Telehealth: Payer: Self-pay

## 2019-02-22 NOTE — Telephone Encounter (Signed)
   Primary Cardiologist:  Sanda Klein, MD   Patient contacted.  History reviewed.  No symptoms to suggest any unstable cardiac conditions.  Based on discussion, with current pandemic situation, we will be postponing this appointment for Raymond Barrera. with a plan for f/u in 4 wks or sooner if feasible/necessary.  If symptoms change, he has been instructed to contact our office.   Routing to C19 CANCEL pool for tracking (P CV DIV CV19 CANCEL - reason for visit "other.") and assigning priority (1 = 4-6 wks, 2 = 6-12 wks, 3 = >12 wks).   Donivan Scull, [place]  02/22/2019 11:50 AM

## 2019-02-22 NOTE — Telephone Encounter (Signed)

## 2019-02-24 ENCOUNTER — Other Ambulatory Visit (HOSPITAL_COMMUNITY): Payer: Self-pay | Admitting: Orthopedic Surgery

## 2019-02-24 ENCOUNTER — Other Ambulatory Visit: Payer: Self-pay | Admitting: Orthopedic Surgery

## 2019-02-24 DIAGNOSIS — Z96642 Presence of left artificial hip joint: Secondary | ICD-10-CM

## 2019-02-24 DIAGNOSIS — M25552 Pain in left hip: Secondary | ICD-10-CM

## 2019-02-25 ENCOUNTER — Ambulatory Visit: Payer: Medicaid Other | Admitting: Cardiovascular Disease

## 2019-03-03 ENCOUNTER — Telehealth: Payer: Self-pay | Admitting: Physician Assistant

## 2019-03-03 NOTE — Telephone Encounter (Signed)
   TELEPHONE CALL NOTE - RESCHEDULING OPV CANCELLATIONS DUE TO COVID-19  This patient has been deemed a candidate for a follow-up tele-health visit to limit community exposure during the Covid-19 pandemic. I spoke with the patient via phone on 03/03/19 and the patient is agreeable to a telehealth visit. I have sent instructions to the patient's MyChart (dotphrase [email or web address]). If the patient does not have an active MyChart account, I have offered to send a link so the patient can set this up. The patient is aware they will receive a phone call 2-3 days prior to their E-Visit, at which time consent will be verbally confirmed.  I will forward this appointment request to the appropriate scheduling pool and the primary cardiologist's assist. I will remove this patient from the C19 cancellation pool.   Ledora Bottcher, PA 03/03/2019, 3:46 PM

## 2019-03-03 NOTE — Telephone Encounter (Addendum)
   Primary Cardiologist:Mihai Croitoru, MD  I called and spoke with the patient's wife. He is doing well without new symptoms. He is scheduled to have surgery on his kidney March 30, 2019 with Dr. Clent Jacks. He will need preop clearance in addition to rescheduling his previously canceled appt. He would like to follow with Dr. Sallyanne Kuster or Rosaria Ferries. He is amendable to a telehealth visit.  Please schedule a telehealth visit with Dr. Sallyanne Kuster OR Suanne Marker Barrett in the next 2-3 weeks. If no availability, please schedule with Almyra Deforest or Fabian Sharp.    Pre-op covering staff: - Please schedule appointment and call patient to inform them.  - please notify me directly when appt is made and I will remove from the C19 pool.   Tami Lin Orva [place], PA  03/03/2019, 1:22 PM

## 2019-03-04 NOTE — Telephone Encounter (Signed)
Virtual Visit Pre-Appointment Phone Call  Steps For Call:  1. Confirm consent - "In the setting of the current Covid19 crisis, you are scheduled for a (phone or video) visit with your provider on (date) at (time).  Just as we do with many in-office visits, in order for you to participate in this visit, we must obtain consent.  If you'd like, I can send this to your mychart (if signed up) or email for you to review.  Otherwise, I can obtain your verbal consent now.  All virtual visits are billed to your insurance company just like a normal visit would be.  By agreeing to a virtual visit, we'd like you to understand that the technology does not allow for your provider to perform an examination, and thus may limit your provider's ability to fully assess your condition.  Finally, though the technology is pretty good, we cannot assure that it will always work on either your or our end, and in the setting of a video visit, we may have to convert it to a phone-only visit.  In either situation, we cannot ensure that we have a secure connection.  Are you willing to proceed?"  2. Give patient instructions for WebEx download to smartphone as below if video visit  3. Advise patient to be prepared with any vital sign or heart rhythm information, their current medicines, and a piece of paper and pen handy for any instructions they may receive the day of their visit  4. Inform patient they will receive a phone call 15 minutes prior to their appointment time (may be from unknown caller ID) so they should be prepared to answer  5. Confirm that appointment type is correct in Epic appointment notes (video vs telephone)    TELEPHONE CALL NOTE  Raymond Barrera. has been deemed a candidate for a follow-up tele-health visit to limit community exposure during the Covid-19 pandemic. I spoke with the patient via phone to ensure availability of phone/video source, confirm preferred email & phone number, and discuss  instructions and expectations.  I reminded Raymond Barrera. to be prepared with any vital sign and/or heart rhythm information that could potentially be obtained via home monitoring, at the time of his visit. I reminded Raymond Barrera. to expect a phone call at the time of his visit if his visit.  Did the patient verbally acknowledge consent to treatment? Yes  Raymond Barrera 03/04/2019 10:42 AM   DOWNLOADING THE [place]  - If Apple, go to [employer] and type in WebEx in the search bar. [place] [employer], the blue/green circle. The app is free but as with any other app downloads, their phone may require them to verify saved payment information or Apple password. The patient does NOT have to create an account.  - If Android, ask patient to go to [employer] and type in WebEx in the search bar. [place] [employer], the blue/green circle. The app is free but as with any other app downloads, their phone may require them to verify saved payment information or Android password. The patient does NOT have to create an account.   CONSENT FOR TELE-HEALTH VISIT - PLEASE REVIEW  I hereby voluntarily request, consent and authorize [facility] and its employed or contracted physicians, physician assistants, nurse practitioners or other licensed health care professionals (the Practitioner), to provide me with telemedicine health care services (the "Services") as deemed necessary by the  treating Practitioner. I acknowledge and consent to receive the Services by the Practitioner via telemedicine. I understand that the telemedicine visit will involve communicating with the Practitioner through live audiovisual communication technology and the disclosure of certain medical information by electronic transmission. I acknowledge that I have been given the opportunity to request an in-person assessment or other available alternative prior to the telemedicine visit and  am voluntarily participating in the telemedicine visit.  I understand that I have the right to withhold or withdraw my consent to the use of telemedicine in the course of my care at any time, without affecting my right to future care or treatment, and that the Practitioner or I may terminate the telemedicine visit at any time. I understand that I have the right to inspect all information obtained and/or recorded in the course of the telemedicine visit and may receive copies of available information for a reasonable fee.  I understand that some of the potential risks of receiving the Services via telemedicine include:  [place] Kitchen Delay or interruption in medical evaluation due to technological equipment failure or disruption; . Information transmitted may not be sufficient (e.g. poor resolution of images) to allow for appropriate medical decision making by the Practitioner; and/or  . In rare instances, security protocols could fail, causing a breach of personal health information.  Furthermore, I acknowledge that it is my responsibility to provide information about my medical history, conditions and care that is complete and accurate to the best of my ability. I acknowledge that Practitioner's advice, recommendations, and/or decision may be based on factors not within their control, such as incomplete or inaccurate data provided by me or distortions of diagnostic images or specimens that may result from electronic transmissions. I understand that the practice of medicine is not an exact science and that Practitioner makes no warranties or guarantees regarding treatment outcomes. I acknowledge that I will receive a copy of this consent concurrently upon execution via email to the email address I last provided but may also request a printed copy by calling the office of [facility].    I understand that my insurance will be billed for this visit.   I have read or had this consent read to me. . I understand the  contents of this consent, which adequately explains the benefits and risks of the Services being provided via telemedicine.  . I have been provided ample opportunity to ask questions regarding this consent and the Services and have had my questions answered to my satisfaction. . I give my informed consent for the services to be provided through the use of telemedicine in my medical care  By participating in this telemedicine visit I agree to the above.

## 2019-03-04 NOTE — Telephone Encounter (Signed)
Spoke to pt and his wife. There are no openings currently in the time frame requested for Dr. Loletha Grayer or Suanne Marker. Pt agreed to to WebEx visit on Monday 4/6 at 11 AM with Fabian Sharp, PA.   Slot will be opened and once that is done, will schedule pt and call back to set up for video WebEx visit.

## 2019-03-04 NOTE — Addendum Note (Signed)
Addended by: Therisa Doyne on: 03/04/2019 10:55 AM   Modules accepted: Orders

## 2019-03-07 ENCOUNTER — Telehealth (INDEPENDENT_AMBULATORY_CARE_PROVIDER_SITE_OTHER): Payer: Medicaid Other | Admitting: Physician Assistant

## 2019-03-07 ENCOUNTER — Telehealth: Payer: Self-pay | Admitting: Physician Assistant

## 2019-03-07 ENCOUNTER — Encounter: Payer: Self-pay | Admitting: Physician Assistant

## 2019-03-07 VITALS — BP 148/92 | HR 80 | Ht 78.0 in | Wt 265.0 lb

## 2019-03-07 DIAGNOSIS — I428 Other cardiomyopathies: Secondary | ICD-10-CM

## 2019-03-07 DIAGNOSIS — I5022 Chronic systolic (congestive) heart failure: Secondary | ICD-10-CM | POA: Diagnosis not present

## 2019-03-07 DIAGNOSIS — E785 Hyperlipidemia, unspecified: Secondary | ICD-10-CM | POA: Diagnosis not present

## 2019-03-07 DIAGNOSIS — Z01818 Encounter for other preprocedural examination: Secondary | ICD-10-CM

## 2019-03-07 DIAGNOSIS — I10 Essential (primary) hypertension: Secondary | ICD-10-CM

## 2019-03-07 DIAGNOSIS — I110 Hypertensive heart disease with heart failure: Secondary | ICD-10-CM | POA: Diagnosis not present

## 2019-03-07 NOTE — Telephone Encounter (Signed)
Attempted to call pt about appointment today at 11 AM with Doreene Adas, PA. Cell VM not set up and left message to call back on home vm. It looks like pt has not viewed messages on MyChart and wanted to make sure he was ready for visit. I sent meeting link and password, also wanted to tell him that password should be all lowercase letters. [employer] was a typo.

## 2019-03-07 NOTE — Telephone Encounter (Signed)
Spoke to pt. Pt wanted me to discuss with with wife. Pt wife stated she was unable to [employer] and someone was supposed to call to help her from IT support today. Told pt wife it is ok, and that I can talk her through setting up Webex. Pt wife had trouble with her phone and was unable to access MyChart messages through her email. Talked pt wife through downloading webex on Android and reviewed consent for visit.

## 2019-03-07 NOTE — Progress Notes (Signed)
Agree, thank you MCr

## 2019-03-07 NOTE — Patient Instructions (Signed)
Medication Instructions:  Your physician recommends that you continue on your current medications as directed. Please refer to the Current Medication list given to you today.  If you need a refill on your cardiac medications before your next appointment, please call your pharmacy.   Follow-Up: At [facility], you and your health needs are our priority.  As part of our continuing mission to provide you with exceptional heart care, we have created designated Provider Care Teams.  These Care Teams include your primary Cardiologist (physician) and Advanced Practice Providers (APPs -  Physician Assistants and Nurse Practitioners) who all work together to provide you with the care you need, when you need it. You will need a follow up appointment in 3 months.  Please call our office 2 months in advance to schedule this appointment.  You may see Sanda Klein, MD or one of the following Advanced Practice Providers on your designated Care Team: [place], [place] . Fabian Sharp, PA-C  Any Other Special Instructions Will Be Listed Below (If Applicable). None

## 2019-03-07 NOTE — Progress Notes (Signed)
Virtual Visit via Video Note   This visit type was conducted due to national recommendations for restrictions regarding the COVID-19 Pandemic (e.g. social distancing) in an effort to limit this patient's exposure and mitigate transmission in our community.  Due to his co-morbid illnesses, this patient is at least at moderate risk for complications without adequate follow up.  This format is felt to be most appropriate for this patient at this time.  All issues noted in this document were discussed and addressed.  A limited physical exam was performed with this format.  Please refer to the patient's chart for his consent to telehealth for [facility].  Evaluation Performed:  Follow-up visit  This visit type was conducted due to national recommendations for restrictions regarding the COVID-19 Pandemic (e.g. social distancing).  This format is felt to be most appropriate for this patient at this time.  All issues noted in this document were discussed and addressed.  Due to his comorbid illnesses, this patient is felt to be at least at moderate risk without adequate follow up.  No physical exam was performed (except for noted visual exam findings with Video Visits).  Please refer to the patient's chart (MyChart message for video visits and phone note for telephone visits) for the patient's consent to telehealth for [facility].  Date:  03/07/2019   ID:  Raymond Barrera., DOB 1961/10/20, MRN [id number]  Patient Location: Home  Provider Location: Home  PCP:  Lucia Gaskins, MD  Cardiologist:  Sanda Klein, MD  Electrophysiologist:  None   Chief Complaint: Preoperative risk evaluation  History of Present Illness:    Raymond Barrera. is a 58 y.o. male who presents via audio/video conferencing for a telehealth visit today.    Mr. Ambrosini has a past medical history significant for hypertension, hyperlipidemia, diabetes mellitus, CVA 05/2228, chronic systolic heart failure with a EF as low  as 30 to 35% on 01/2017, and chronic diastolic dysfunction.  Echocardiogram 06/15/2018 with an EF of 20 to 25%, severe diffuse hypokinesis but no regional wall motion abnormality, grade 1 diastolic dysfunction, and severely dilated left atrium.  Nuclear stress test at the same time revealed low EF but no reversible ischemia.  Patient underwent lumbar fusion anterior and posterior approach following this cardiac testing without adverse events.  He is being seen today via telehealth visit as part of his regularly scheduled follow-up appointment in addition to preoperative risk evaluation for kidney surgery scheduled for 03/30/2019.  Lab work collected on 10/24/2018 was reviewed and within normal limits.  He was not anemic and had normal renal function.  He is maintained on Norvasc, labetalol, Entresto, and Zocor.   Blood pressure is mildly elevated prior to our evisit. His pressures at home generally run 798-921 JHERDEYC/14-48 diastolic. He just took his BP medicine prior to this call. He is seeing his PCP on 03/09/19 for clearance and will have lab work including lipid profile. He will also have his pre-surgical visit with [facility] on 03/08/19, which will include an EKG. Overall, he is doing well without cardiac complaints. He denies anginal symptoms and appears euvolemic. He is having pain in his abdomen and hip and will be seeing an orthopedic doctor soon.    The patient does not have symptoms concerning for COVID-19 infection (fever, chills, cough, or new shortness of breath).    Past Medical History:  Diagnosis Date  . Anxiety    due to the stroke  . Arthritis   . Back  pain    hx of buldging disc  . Diabetes mellitus without complication ([place])    takes Metformin daily  . High cholesterol    takes Zocor daily  . Hypertension    takes Benazepril and HCTZ  daily  . Joint pain   . Joint swelling   . Memory impairment    occassional - from stroke  . Myocardial infarction ([place]) 1987  .  Pneumonia    hx of-80's  . PONV (postoperative nausea and vomiting)    took a while for him to wake up after previous anesthesia  . Shortness of breath dyspnea    do to pain  . Sleep apnea    never had a sleep study,but states Dr. Cindie Laroche says he has it  . Slurred speech   . Stroke ([place]) 08/2013   7 mini-strokes, last stroke 2017  . TIA (transient ischemic attack) 2014   x 7   . Urinary frequency    Past Surgical History:  Procedure Laterality Date  . ABDOMINAL EXPOSURE N/A 06/16/2018   Procedure: ABDOMINAL EXPOSURE;  Surgeon: Rosetta Posner, MD;  Location: [place];  Service: Vascular;  Laterality: N/A;  . ANKLE SURGERY  2008   left ankle-otif-[facility]  . ANTERIOR LUMBAR FUSION Bilateral 06/16/2018   Procedure: LUMBAR 4-5 LUMBAR 5-SACRUM 1 ANTERIOR LUMBAR INTERBODY FUSION WITH INSTRUMENTATION AND ALLOGRAFT;  Surgeon: Phylliss Bob, MD;  Location: [place];  Service: Orthopedics;  Laterality: Bilateral;  . BACK SURGERY    . HEMATOMA EVACUATION Left 08/13/2018   Procedure: EVACUATION HEMATOMA LEFT ABDOMINAL WALL;  Surgeon: Rosetta Posner, MD;  [facility];  Service: Vascular;  Laterality: Left;  . IR RADIOLOGIST EVAL & MGMT  07/27/2018  . IR US GUIDE BX ASP/DRAIN  07/14/2018  . JOINT REPLACEMENT     both hips replaced   . LUMBAR LAMINECTOMY/DECOMPRESSION MICRODISCECTOMY Right 10/17/2014   Procedure: LUMBAR LAMINECTOMY/DECOMPRESSION MICRODISCECTOMY 2 LEVELS;  Surgeon: [name], MD;  Location: [facility];  Service: Neurosurgery;  Laterality: Right;  Right L45 L5S1 laminectomy and foraminotomy  . MASS EXCISION  09/13/2012   Procedure: EXCISION MASS;  Surgeon: Jamesetta So, MD;  Location: [facility];  Service: General;  Laterality: N/A;  . TONSILLECTOMY    . TOTAL HIP ARTHROPLASTY Right 03/20/2015  . TOTAL HIP ARTHROPLASTY Right 03/20/2015   Procedure: TOTAL HIP ARTHROPLASTY ANTERIOR APPROACH;  Surgeon: Renette Butters, MD;  Location: [place];  Service: Orthopedics;  Laterality: Right;   . TOTAL HIP ARTHROPLASTY Left 01/08/2016   Procedure: TOTAL HIP ARTHROPLASTY ANTERIOR APPROACH;  Surgeon: Renette Butters, MD;  Location: [place];  Service: Orthopedics;  Laterality: Left;     Current Meds  Medication Sig  . amLODipine (NORVASC) 10 MG tablet Take 10 mg by mouth daily.  . diazepam (VALIUM) 5 MG tablet 1 po tid for spasm pain (Patient taking differently: Take 5 mg by mouth every 8 (eight) hours as needed. 1 po tid for spasm pain)  . gabapentin (NEURONTIN) 300 MG capsule Take 300 mg by mouth 3 (three) times daily.  Marland Kitchen glucose 4 GM chewable tablet Chew 1 tablet by mouth as needed for low blood sugar.  . labetalol (NORMODYNE) 200 MG tablet Take 200 mg by mouth 2 (two) times daily.  . metFORMIN (GLUCOPHAGE) 500 MG tablet Take 500 mg by mouth 2 (two) times daily with a meal.  . Multiple Vitamin (MULTIVITAMIN WITH MINERALS) TABS tablet Take 1 tablet by mouth daily.  . Oxycodone HCl 10 MG  TABS Take 1 tablet by mouth 5 (five) times daily as needed for pain.  . sacubitril-valsartan (ENTRESTO) 24-26 MG Take 1 tablet by mouth 2 (two) times daily.  . simvastatin (ZOCOR) 40 MG tablet Take 40 mg by mouth daily.  Marland Kitchen tiZANidine (ZANAFLEX) 4 MG tablet Take 1-2 tablets by mouth daily as needed for muscle spasms.     Allergies:   Poison ivy extract [poison ivy extract]   Social History   Tobacco Use  . Smoking status: Current Every Day Smoker    Packs/day: 0.25    Years: 47.00    Pack years: 11.75    Types: Cigarettes  . Smokeless tobacco: Never Used  . Tobacco comment: Less thant 1/4 of a pack.   Substance Use Topics  . Alcohol use: No    Comment: quit 2012  . Drug use: Not Currently    Types: Cocaine    Comment: many yrs ago., last time- late 2016     Family Hx: The patient's family history includes Heart disease in his father.  ROS:   Please see the history of present illness.     All other systems reviewed and are negative.   Prior CV studies:   The following studies were  reviewed today:  Lexiscan Myoview 06/15/2018:  Nuclear stress EF: 24%.  There was no ST segment deviation noted during stress.  No T wave inversion was noted during stress.  This is a high risk study.  The left ventricular ejection fraction is severely decreased (<30%).   High risk nuclear study due to severe dilated cardiomyopathy, LVEF<30%. There is no reversible ischemia.   Echo 06/15/2018: Study Conclusions  - Left ventricle: The cavity size was mildly dilated. There was   moderate concentric hypertrophy. Systolic function was severely   reduced. The estimated ejection fraction was in the range of 20%   to 25%. Severe diffuse hypokinesis with no identifiable regional   variations. Doppler parameters are consistent with abnormal left   ventricular relaxation (grade 1 diastolic dysfunction). Acoustic   contrast opacification revealed no evidence ofthrombus. - Left atrium: The atrium was severely dilated. - Direct comparison with images from March 2018 shows little change   in left ventricular function.  Labs/Other Tests and Data Reviewed:    EKG:  EKGs 06/16/18, 02/25/17, 02/02/17 were reviewed.  Recent Labs: 10/24/2018: ALT 41; BUN 25; Creatinine, Ser 1.20; Hemoglobin 13.4; Platelets 207; Potassium 4.5; Sodium 140   Recent Lipid Panel Lab Results  Component Value Date/Time   CHOL 180 02/02/2017 05:40 AM   TRIG 169 (H) 02/02/2017 05:40 AM   HDL 31 (L) 02/02/2017 05:40 AM   CHOLHDL 5.8 02/02/2017 05:40 AM   LDLCALC 115 (H) 02/02/2017 05:40 AM    Wt Readings from Last 3 Encounters:  03/07/19 265 lb (120.2 kg)  01/27/19 264 lb (119.7 kg)  11/02/18 248 lb 8 oz (112.7 kg)     Objective:    Vital Signs:  BP (!) 148/92   Pulse 80   Ht 6\' 6"  (1.981 m)   Wt 265 lb (120.2 kg)   BMI 30.62 kg/m    Gen: Well nourished, well developed male in no acute distress. Lung: Respirations unlabored Heart: Heart rate was regular Neuro: Alert and oriented x 4 Psych: Normal  affect   ASSESSMENT & PLAN:     Chronic systolic and diastolic heart failure Nonischemic cardiomyopathy He is on norvasc and labetalol. When in-person visits are re-established in clinic, consider D/C norvasc and switching beta blocker  to toprol or carvedilol given his reduced EF. Will not titrate medications for now since we are relying on home BP machines. Titrating medications may be tricky using only virtual visits and I do not want to ask him to visit a pharmacy or our clinic for a BP check. He is euvolemic, denies swelling and orthopnea. He has been stable on entresto. If he needs better blood pressure control, would favor titrating entresto first. No medication changes for now.    Hypertension Continue medications as above. BP was elevated this visit, but he had just taken his BP medications. If better pressure control needed, would increase entresto. No changes today.   Hyperlipidemia  I do not have a recent lipid profile. Labs reviewed on [facility] and are stable as of last fall. Pt states he will have labs drawn at his PCP visit this week.    Preoperative evaluation for cardiac risk, upcoming kidney surgery 03/30/2019 Patient does not have a history of heart disease, but has had a stroke.  He does not require insulin for his diabetes and has normal renal function.  He does have heart failure, he appears euvolemic today.  According to the RCRI he is a Class IV risk with 11% risk of a major cardiac event. The patient is aware of his risk. He does not need further cardiovascular testing prior to surgery. He is willing to proceed with this necessary surgery.  He is at risk for volume overload with excess IVF that may be needed during surgery. May require IV lasix following surgery.  Unable to obtain EKG during evisit. He will have and EKG at his upcoming pre-surgical visit this week.     COVID-19 Education: The signs and symptoms of COVID-19 were discussed with the patient and how to seek  care for testing (follow up with PCP or arrange E-visit).  The importance of social distancing was discussed today.  Time:   Today, I have spent 16 minutes with the patient with telehealth technology discussing the above problems.     Medication Adjustments/Labs and Tests Ordered: Current medicines are reviewed at length with the patient today.  Concerns regarding medicines are outlined above.  Tests Ordered: No orders of the defined types were placed in this encounter.  Medication Changes: No orders of the defined types were placed in this encounter.   Disposition:  Follow up in 3 month(s) with Dr. Sallyanne Kuster.   Signed, Ledora Bottcher, [place]  03/07/2019 11:33 AM    [facility]

## 2019-03-10 ENCOUNTER — Other Ambulatory Visit: Payer: Self-pay

## 2019-03-10 MED ORDER — SACUBITRIL-VALSARTAN 24-26 MG PO TABS: 1.0000 | ORAL_TABLET | Freq: Two times a day (BID) | ORAL | 2 refills | Status: DC

## 2019-03-16 NOTE — Progress Notes (Signed)
03/07/19-Clearance- in telemedicine visit ( card)  Stress- 06/15/18-epic  ECHO-06/15/18-epic  CXR- 01/14/19-epic  EKG-06/16/18-epic

## 2019-03-17 ENCOUNTER — Other Ambulatory Visit: Payer: Self-pay

## 2019-03-21 ENCOUNTER — Telehealth: Payer: Self-pay | Admitting: Cardiovascular Disease

## 2019-03-21 NOTE — Telephone Encounter (Signed)
[name] calling stating that pt's pharmacy [facility] needs a prior auth for pt's medication Entresto. [name] would like a call back concerning this matter. Please address

## 2019-03-21 NOTE — Patient Instructions (Addendum)
Raymond Barrera.  03/21/2019   Your procedure is scheduled on: 03-30-19    Report to [facility] Main  Entrance    Report to Admitting at 9:45 AM    Call this number if you have problems the morning of surgery [phone]    Remember: Do not eat food or drink liquids :After Midnight.    BRUSH YOUR TEETH MORNING OF SURGERY AND RINSE YOUR MOUTH OUT, NO CHEWING GUM CANDY OR MINTS.     Take these medicines the morning of surgery with A SIP OF WATER: Amlodipine, Gabapentin, Labelatol   DO NOT TAKE ANY DIABETIC MEDICATIONS DAY OF YOUR SURGERY                               You may not have any metal on your body including hair pins and              piercings  Do not wear jewelry, cologne, lotions, powders or  deodorant              Men may shave face and neck.   Do not bring valuables to the hospital. [place].  Contacts, dentures or bridgework may not be worn into surgery.     Patients discharged the day of surgery will not be allowed to drive home. IF YOU ARE HAVING SURGERY AND GOING HOME THE SAME DAY, YOU MUST HAVE AN ADULT TO DRIVE YOU HOME AND BE WITH YOU FOR 24 HOURS. YOU MAY GO HOME BY TAXI OR UBER OR ORTHERWISE, BUT AN ADULT MUST ACCOMPANY YOU HOME AND STAY WITH YOU FOR 24 HOURS.   Special Instructions: Please follow your prep, per your surgeon's instructions              Please read over the following fact sheets you were given: _____________________________________________________________________             How to Manage Your Diabetes Before and After Surgery  Why is it important to control my blood sugar before and after surgery? . Improving blood sugar levels before and after surgery helps healing and can limit problems. . A way of improving blood sugar control is eating a healthy diet by: o  Eating less sugar and carbohydrates o  Increasing activity/exercise o  Talking with your doctor  about reaching your blood sugar goals . High blood sugars (greater than 180 mg/dL) can raise your risk of infections and slow your recovery, so you will need to focus on controlling your diabetes during the weeks before surgery. . Make sure that the doctor who takes care of your diabetes knows about your planned surgery including the date and location.  How do I manage my blood sugar before surgery? . Check your blood sugar at least 4 times a day, starting 2 days before surgery, to make sure that the level is not too high or low. o Check your blood sugar the morning of your surgery when you wake up and every 2 hours until you get to the Short Stay unit. . If your blood sugar is less than 70 mg/dL, you will need to treat for low blood sugar: o Do not take insulin. o Treat a low blood sugar (less than 70 mg/dL)  with  cup of clear juice (cranberry or apple), 4 glucose tablets, OR glucose gel. o Recheck blood sugar in 15 minutes after treatment (to make sure it is greater than 70 mg/dL). If your blood sugar is not greater than 70 mg/dL on recheck, call [phone] for further instructions. . Report your blood sugar to the short stay nurse when you get to Short Stay.  . If you are admitted to the hospital after surgery: o Your blood sugar will be checked by the staff and you will probably be given insulin after surgery (instead of oral diabetes medicines) to make sure you have good blood sugar levels. o The goal for blood sugar control after surgery is 80-180 mg/dL.   WHAT DO I DO ABOUT MY DIABETES MEDICATION?  Marland Kitchen Do not take oral diabetes medicines (pills) the morning of surgery.  THE DAY BEFORE SURGERY, take your usual Metformin       Reviewed and Endorsed by [facility] Patient Education Committee, August 2015   [facility] - Preparing for Surgery Before surgery, you can play an important role.  Because skin is not sterile, your skin needs to be as free of germs as possible.  You can  reduce the number of germs on your skin by washing with CHG (chlorahexidine gluconate) soap before surgery.  CHG is an antiseptic cleaner which kills germs and bonds with the skin to continue killing germs even after washing. Please DO NOT use if you have an allergy to CHG or antibacterial soaps.  If your skin becomes reddened/irritated stop using the CHG and inform your nurse when you arrive at Short Stay. Do not shave (including legs and underarms) for at least 48 hours prior to the first CHG shower.  You may shave your face/neck. Please follow these instructions carefully:  1.  Shower with CHG Soap the night before surgery and the  morning of Surgery.  2.  If you choose to wash your hair, wash your hair first as usual with your  normal  shampoo.  3.  After you shampoo, rinse your hair and body thoroughly to remove the  shampoo.                           4.  Use CHG as you would any other liquid soap.  You can apply chg directly  to the skin and wash                       Gently with a scrungie or clean washcloth.  5.  Apply the CHG Soap to your body ONLY FROM THE NECK DOWN.   Do not use on face/ open                           Wound or open sores. Avoid contact with eyes, ears mouth and genitals (private parts).                       Wash face,  Genitals (private parts) with your normal soap.             6.  Wash thoroughly, paying special attention to the area where your surgery  will be performed.  7.  Thoroughly rinse your body with warm water from the neck down.  8.  DO NOT shower/wash with your normal soap after using and rinsing off  the CHG Soap.  9.  Pat yourself dry with a clean towel.            10.  Wear clean pajamas.            11.  Place clean sheets on your bed the night of your first shower and do not  sleep with pets. Day of Surgery : Do not apply any lotions/deodorants the morning of surgery.  Please wear clean clothes to the hospital/surgery center.  FAILURE TO  FOLLOW THESE INSTRUCTIONS MAY RESULT IN THE CANCELLATION OF YOUR SURGERY PATIENT SIGNATURE_________________________________  NURSE SIGNATURE__________________________________  ________________________________________________________________________  WHAT IS A BLOOD TRANSFUSION? Blood Transfusion Information  A transfusion is the replacement of blood or some of its parts. Blood is made up of multiple cells which provide different functions.  Red blood cells carry oxygen and are used for blood loss replacement.  White blood cells fight against infection.  Platelets control bleeding.  Plasma helps clot blood.  Other blood products are available for specialized needs, such as hemophilia or other clotting disorders. BEFORE THE TRANSFUSION  Who gives blood for transfusions?   Healthy volunteers who are fully evaluated to make sure their blood is safe. This is blood bank blood. Transfusion therapy is the safest it has ever been in the practice of medicine. Before blood is taken from a donor, a complete history is taken to make sure that person has no history of diseases nor engages in risky social behavior (examples are intravenous drug use or sexual activity with multiple partners). The donor's travel history is screened to minimize risk of transmitting infections, such as malaria. The donated blood is tested for signs of infectious diseases, such as HIV and hepatitis. The blood is then tested to be sure it is compatible with you in order to minimize the chance of a transfusion reaction. If you or a relative donates blood, this is often done in anticipation of surgery and is not appropriate for emergency situations. It takes many days to process the donated blood. RISKS AND COMPLICATIONS Although transfusion therapy is very safe and saves many lives, the main dangers of transfusion include:   Getting an infectious disease.  Developing a transfusion reaction. This is an allergic reaction to  something in the blood you were given. Every precaution is taken to prevent this. The decision to have a blood transfusion has been considered carefully by your caregiver before blood is given. Blood is not given unless the benefits outweigh the risks. AFTER THE TRANSFUSION  Right after receiving a blood transfusion, you will usually feel much better and more energetic. This is especially true if your red blood cells have gotten low (anemic). The transfusion raises the level of the red blood cells which carry oxygen, and this usually causes an energy increase.  The nurse administering the transfusion will monitor you carefully for complications. HOME CARE INSTRUCTIONS  No special instructions are needed after a transfusion. You may find your energy is better. Speak with your caregiver about any limitations on activity for underlying diseases you may have. SEEK MEDICAL CARE IF:   Your condition is not improving after your transfusion.  You develop redness or irritation at the intravenous (IV) site. SEEK IMMEDIATE MEDICAL CARE IF:  Any of the following symptoms occur over the next 12 hours:  Shaking chills.  You have a temperature by mouth above 102 F (38.9 C), not controlled by medicine.  Chest, back, or muscle pain.  People around you feel you are not acting correctly or  are confused.  Shortness of breath or difficulty breathing.  Dizziness and fainting.  You get a rash or develop hives.  You have a decrease in urine output.  Your urine turns a dark color or changes to pink, red, or brown. Any of the following symptoms occur over the next 10 days:  You have a temperature by mouth above 102 F (38.9 C), not controlled by medicine.  Shortness of breath.  Weakness after normal activity.  The white part of the eye turns yellow (jaundice).  You have a decrease in the amount of urine or are urinating less often.  Your urine turns a dark color or changes to pink, red, or  brown. Document Released: 11/14/2000 Document Revised: 02/09/2012 Document Reviewed: 07/03/2008 [facility] Patient Information 2014 [place], [place].  _______________________________________________________________________

## 2019-03-21 NOTE — Telephone Encounter (Signed)
New Message             Katie with cover my meds is calling to see if the authorization form was rec'vd for "Entresto" Pls call to advise

## 2019-03-22 ENCOUNTER — Encounter (HOSPITAL_COMMUNITY): Payer: Medicaid Other

## 2019-03-22 ENCOUNTER — Inpatient Hospital Stay (HOSPITAL_COMMUNITY)
Admission: RE | Admit: 2019-03-22 | Discharge: 2019-03-22 | Disposition: A | Discharge status: Home / Self Care | Payer: Medicaid Other | Source: Ambulatory Visit

## 2019-03-22 ENCOUNTER — Other Ambulatory Visit: Payer: Self-pay

## 2019-03-22 ENCOUNTER — Encounter (HOSPITAL_COMMUNITY)
Admission: RE | Admit: 2019-03-22 | Discharge: 2019-03-22 | Disposition: A | Discharge status: Home / Self Care | Payer: Medicaid Other | Source: Ambulatory Visit | Attending: Urology | Admitting: Urology

## 2019-03-22 ENCOUNTER — Encounter (HOSPITAL_COMMUNITY): Payer: Self-pay

## 2019-03-22 DIAGNOSIS — Z01812 Encounter for preprocedural laboratory examination: Secondary | ICD-10-CM | POA: Diagnosis not present

## 2019-03-22 LAB — BASIC METABOLIC PANEL
Anion gap: 8 (ref 5–15)
BUN: 24 mg/dL — ABNORMAL HIGH (ref 6–20)
CO2: 26 mmol/L (ref 22–32)
Calcium: 10.5 mg/dL — ABNORMAL HIGH (ref 8.9–10.3)
Chloride: 106 mmol/L (ref 98–111)
Creatinine, Ser: 1.18 mg/dL (ref 0.61–1.24)
GFR calc Af Amer: >60 mL/min (ref >60)
GFR calc non Af Amer: >60 mL/min (ref >60)
Glucose, Bld: 115 mg/dL — ABNORMAL HIGH (ref 70–99)
Potassium: 4.9 mmol/L (ref 3.5–5.1)
Sodium: 140 mmol/L (ref 135–145)

## 2019-03-22 LAB — HEMOGLOBIN A1C
Hgb A1c MFr Bld: 7.1 % — ABNORMAL HIGH (ref 4.8–5.6)
Mean Plasma Glucose: 157.07 mg/dL

## 2019-03-22 LAB — GLUCOSE, CAPILLARY: Glucose-Capillary: 130 mg/dL — ABNORMAL HIGH (ref 70–99)

## 2019-03-22 LAB — ABO/RH: ABO/RH(D): O POS

## 2019-03-23 ENCOUNTER — Other Ambulatory Visit: Payer: Self-pay

## 2019-03-23 ENCOUNTER — Telehealth: Payer: Self-pay | Admitting: *Deleted

## 2019-03-23 NOTE — Pre-Procedure Instructions (Signed)
Hgb A1C results 03/22/2019 sent to Dr. Lovena Neighbours via epic.

## 2019-03-23 NOTE — Telephone Encounter (Signed)
Called [facility] tracks to get Entreso precerted.  PA # [id number]. Call refer # [id number].

## 2019-03-24 NOTE — Progress Notes (Signed)
Anesthesia Chart Review   Case:  [id number] Date/Time:  03/30/19 1115   Procedure:  XI ROBOTIC ASSITED PARTIAL NEPHRECTOMY POSSIBLE RADICAL NEPHRECTOMY (Left )   Anesthesia type:  General   Pre-op diagnosis:  LEFT RENAL MASS   Location:  [place] [facility] / [facility]   Surgeon:  Ceasar Mons, MD      DISCUSSION: 58 yo current every day (11.75 pack years) with h/o PONV, anxiety, HTN, MI (1987), Stroke (2017 with residual memory impairment, slurred speech), DM II, sleep apnea, left renal mass scheduled for above procedure 03/30/19 with Dr. Harrell Gave Lovena Neighbours.   Pt seen by cardiology via telemedicine 03/07/19.  Per provider, Fabian Sharp, PA-C, "Patient does not have a history of heart disease, but has had a stroke.  He does not require insulin for his diabetes and has normal renal function.  He does have heart failure, he appears euvolemic today.  According to the RCRI he is a Class IV risk with 11% risk of a major cardiac event. The patient is aware of his risk. He does not need further cardiovascular testing prior to surgery. He is willing to proceed with this necessary surgery.  He is at risk for volume overload with excess IVF that may be needed during surgery. May require IV lasix following surgery."  Pt can proceed with planned procedure barring acute status change.  VS: BP (!) 160/96 (BP Location: Right Arm)   Pulse 77   Temp 36.7 C (Oral)   Resp 18   Ht 6\' 6"  (1.981 m)   Wt 125.4 kg   SpO2 98%   BMI 31.94 kg/m   PROVIDERS: Lucia Gaskins, MD is PCP   Sanda Klein, MD is Cardiologist  LABS: Labs reviewed: Acceptable for surgery. (all labs ordered are listed, but only abnormal results are displayed)  Labs Reviewed  BASIC METABOLIC PANEL - Abnormal; Notable for the following components:      Result Value   Glucose, Bld 115 (*)    BUN 24 (*)    Calcium 10.5 (*)    All other components within normal limits  GLUCOSE, CAPILLARY - Abnormal; Notable for the following  components:   Glucose-Capillary 130 (*)    All other components within normal limits  HEMOGLOBIN A1C - Abnormal; Notable for the following components:   Hgb A1c MFr Bld 7.1 (*)    All other components within normal limits  TYPE AND SCREEN  ABO/RH     IMAGES: Chest Xray 01/14/2019 FINDINGS: Chronic elevation of the left hemidiaphragm is stable. Associated mild chronic left lung base atelectasis. Stable mild cardiomegaly. Other mediastinal contours are within normal limits. Visualized tracheal air column is within normal limits. No pneumothorax, pulmonary edema, pleural effusion or acute pulmonary opacity.  Negative visible bowel gas pattern.  Stable sclerotic benign bone island in the right anterior 2nd rib since at least 2015 (arrow). Stable other visualized osseous structures.  IMPRESSION: No acute or metastatic process identified radiographically.  CT Abdomen Pelvis 10/24/18 IMPRESSION: No acute intra-abdominal or pelvic finding by CT.  4.3 cm solid enhancing left renal lower pole mass compatible with renal cell carcinoma. Recommend nonemergent outpatient urology consultation.  Aortoiliac atherosclerosis without aneurysm  Other postoperative findings as above.  Carotid Doppler 02/02/17 IMPRESSION: 1. Minimal amount of right-sided atherosclerotic plaque, not resulting in hemodynamically significant stenosis. 2. Unremarkable sonographic evaluation of the left carotid system.  EKG:  06/16/18 Rate 101 bpm Sinus tachycardia with frequent PVCs Moderate voltage criteria for LVH, may be normal variant  T wave abnormality, consider inferolateral ischemia vs. LVH strain pattern Since previous tracing PVCs are new  CV: Echo 06/15/18 Study Conclusions  - Left ventricle: The cavity size was mildly dilated. There was   moderate concentric hypertrophy. Systolic function was severely   reduced. The estimated ejection fraction was in the range of 20%   to 25%. Severe  diffuse hypokinesis with no identifiable regional   variations. Doppler parameters are consistent with abnormal left   ventricular relaxation (grade 1 diastolic dysfunction). Acoustic   contrast opacification revealed no evidence ofthrombus. - Left atrium: The atrium was severely dilated. - Direct comparison with images from March 2018 shows little change   in left ventricular function.  Myocardial Perfusion Imaging 06/15/18  Nuclear stress EF: 24%.  There was no ST segment deviation noted during stress.  No T wave inversion was noted during stress.  This is a high risk study.  The left ventricular ejection fraction is severely decreased (<30%).   High risk nuclear study due to severe dilated cardiomyopathy, LVEF<30%. There is no reversible ischemia.  Past Medical History:  Diagnosis Date  . Anxiety    due to the stroke  . Arthritis   . Back pain    hx of buldging disc  . Diabetes mellitus without complication ([place])    takes Metformin daily  . High cholesterol    takes Zocor daily  . Hypertension    takes Benazepril and HCTZ  daily  . Joint pain   . Joint swelling   . Memory impairment    occassional - from stroke  . Myocardial infarction ([place]) 1987  . Pneumonia    hx of-80's  . PONV (postoperative nausea and vomiting)    took a while for him to wake up after previous anesthesia  . Shortness of breath dyspnea    do to pain  . Sleep apnea    never had a sleep study,but states Dr. Cindie Laroche says he has it  . Slurred speech   . Stroke ([place]) 08/2013   7 mini-strokes, last stroke 2017  . TIA (transient ischemic attack) 2014   x 7   . Urinary frequency     Past Surgical History:  Procedure Laterality Date  . ABDOMINAL EXPOSURE N/A 06/16/2018   Procedure: ABDOMINAL EXPOSURE;  Surgeon: Rosetta Posner, MD;  Location: [facility];  Service: Vascular;  Laterality: N/A;  . ANKLE SURGERY  2008   left ankle-otif-[facility]  . ANTERIOR LUMBAR FUSION Bilateral 06/16/2018   Procedure:  LUMBAR 4-5 LUMBAR 5-SACRUM 1 ANTERIOR LUMBAR INTERBODY FUSION WITH INSTRUMENTATION AND ALLOGRAFT;  Surgeon: Phylliss Bob, MD;  Location: [facility];  Service: Orthopedics;  Laterality: Bilateral;  . BACK SURGERY    . HEMATOMA EVACUATION Left 08/13/2018   Procedure: EVACUATION HEMATOMA LEFT ABDOMINAL WALL;  Surgeon: Rosetta Posner, MD;  Location[facility];  Service: Vascular;  Laterality: Left;  . IR RADIOLOGIST EVAL & MGMT  07/27/2018  . IR US GUIDE BX ASP/DRAIN  07/14/2018  . JOINT REPLACEMENT     both hips replaced   . LUMBAR LAMINECTOMY/DECOMPRESSION MICRODISCECTOMY Right 10/17/2014   Procedure: LUMBAR LAMINECTOMY/DECOMPRESSION MICRODISCECTOMY 2 LEVELS;  Surgeon: [name], MD;  Location: [facility];  Service: Neurosurgery;  Laterality: Right;  Right L45 L5S1 laminectomy and foraminotomy  . MASS EXCISION  09/13/2012   Procedure: EXCISION MASS;  Surgeon: Jamesetta So, MD;  Location: [facility];  Service: General;  Laterality: N/A;  . TONSILLECTOMY    . TOTAL HIP ARTHROPLASTY Right 03/20/2015  .  TOTAL HIP ARTHROPLASTY Right 03/20/2015   Procedure: TOTAL HIP ARTHROPLASTY ANTERIOR APPROACH;  Surgeon: Renette Butters, MD;  Location: [facility];  Service: Orthopedics;  Laterality: Right;  . TOTAL HIP ARTHROPLASTY Left 01/08/2016   Procedure: TOTAL HIP ARTHROPLASTY ANTERIOR APPROACH;  Surgeon: Renette Butters, MD;  Location: [facility];  Service: Orthopedics;  Laterality: Left;    MEDICATIONS: . amLODipine (NORVASC) 10 MG tablet  . aspirin EC 81 MG tablet  . Calcium-Vitamin D-Vitamin K (VIACTIV CALCIUM PLUS D PO)  . diazepam (VALIUM) 5 MG tablet  . gabapentin (NEURONTIN) 300 MG capsule  . glucose 4 GM chewable tablet  . labetalol (NORMODYNE) 200 MG tablet  . meloxicam (MOBIC) 15 MG tablet  . metFORMIN (GLUCOPHAGE) 500 MG tablet  . Multiple Vitamins-Minerals (ADULT GUMMY PO)  . Oxycodone HCl 10 MG TABS  . sacubitril-valsartan (ENTRESTO) 24-26 MG  . simvastatin (ZOCOR) 40 MG tablet  . tiZANidine  (ZANAFLEX) 4 MG tablet   No current facility-administered medications for this encounter.      Maia Plan [facility] Pre-Surgical Testing [phone] 03/24/19 11:10 AM

## 2019-03-24 NOTE — Anesthesia Preprocedure Evaluation (Deleted)
Anesthesia Evaluation    Airway        Dental   Pulmonary Current Smoker,           Cardiovascular hypertension,      Neuro/Psych    GI/Hepatic   Endo/Other  diabetes  Renal/GU      Musculoskeletal   Abdominal   Peds  Hematology   Anesthesia Other Findings   Reproductive/Obstetrics                             Anesthesia Physical Anesthesia Plan  ASA:   Anesthesia Plan:    Post-op Pain Management:    Induction:   PONV Risk Score and Plan:   Airway Management Planned:   Additional Equipment:   Intra-op Plan:   Post-operative Plan:   Informed Consent:   Plan Discussed with:   Anesthesia Plan Comments: (See PAT note 03/22/19, Konrad Felix, PA-C)        Anesthesia Quick Evaluation

## 2019-03-25 ENCOUNTER — Other Ambulatory Visit: Payer: Self-pay

## 2019-03-28 ENCOUNTER — Ambulatory Visit (HOSPITAL_COMMUNITY)
Admission: RE | Admit: 2019-03-28 | Discharge: 2019-03-28 | Disposition: A | Discharge status: Home / Self Care | Payer: Medicaid Other | Source: Ambulatory Visit | Attending: Orthopedic Surgery | Admitting: Orthopedic Surgery

## 2019-03-28 ENCOUNTER — Other Ambulatory Visit: Payer: Self-pay

## 2019-03-28 DIAGNOSIS — Z96642 Presence of left artificial hip joint: Secondary | ICD-10-CM

## 2019-03-28 DIAGNOSIS — M25552 Pain in left hip: Secondary | ICD-10-CM | POA: Insufficient documentation

## 2019-03-28 MED ORDER — TECHNETIUM TC 99M MEDRONATE IV KIT
20.0000 | PACK | Freq: Once | INTRAVENOUS | Status: AC | PRN
  Administered 2019-03-28: 20 via INTRAVENOUS

## 2019-03-28 NOTE — Telephone Encounter (Signed)
° °  Patient's spouse calling. She is currently at pharmacy. Pharmacy is stating they need pre auth for [facility]. Patient has not had medication in 2 weeks. Please call

## 2019-03-29 MED ORDER — DEXTROSE 5 % IV SOLN
3.0000 g | INTRAVENOUS | Status: AC
  Administered 2019-03-30: 3 g via INTRAVENOUS
  Filled 2019-03-29: qty 3000
  Filled 2019-03-29: qty 3

## 2019-03-29 NOTE — Telephone Encounter (Addendum)
° °  Spouse calling, [facility]) still unable to fill medication, due to pre auth issue

## 2019-03-29 NOTE — Anesthesia Preprocedure Evaluation (Addendum)
Anesthesia Evaluation    Reviewed: Allergy & Precautions, Patient's Chart, lab work & pertinent test results, reviewed documented beta blocker date and time   History of Anesthesia Complications (+) PONV, PROLONGED EMERGENCE and history of anesthetic complications  Airway Mallampati: II  TM Distance: >3 FB Neck ROM: Full    Dental  (+) Edentulous Upper, Partial Lower, Dental Advisory Given   Pulmonary sleep apnea , Current Smoker,    Pulmonary exam normal        Cardiovascular hypertension, Pt. on medications and Pt. on home beta blockers + Past MI and +CHF  Normal cardiovascular exam  Echo 06/15/18 Study Conclusions  - Left ventricle: The cavity size was mildly dilated. There was moderate concentric hypertrophy. Systolic function was severely reduced. The estimated ejection fraction was in the range of 20% to 25%. Severe diffuse hypokinesis with no identifiable regional variations. Doppler parameters are consistent with abnormal left ventricular relaxation (grade 1 diastolic dysfunction). Acoustic contrast opacification revealed no evidence ofthrombus. - Left atrium: The atrium was severely dilated. - Direct comparison with images from March 2018 shows little change in left ventricular function.  Myocardial Perfusion Imaging 06/15/18  Nuclear stress EF: 24%.  There was no ST segment deviation noted during stress.  No T wave inversion was noted during stress.  This is a high risk study.  The left ventricular ejection fraction is severely decreased (<30%).  High risk nuclear study due to severe dilated cardiomyopathy, LVEF<30%. There is no reversible ischemia.    Neuro/Psych Anxiety TIACVA, Residual Symptoms    GI/Hepatic negative GI ROS, (+)     substance abuse  cocaine use,   Endo/Other  diabetes, Type 2, Oral Hypoglycemic Agents  Renal/GU negative Renal ROS  negative genitourinary    Musculoskeletal  (+) Arthritis ,   Abdominal   Peds  Hematology negative hematology ROS (+)   Anesthesia Other Findings   Reproductive/Obstetrics                            Anesthesia Physical  Anesthesia Plan  ASA: III  Anesthesia Plan: General   Post-op Pain Management:    Induction: Intravenous  PONV Risk Score and Plan: 4 or greater and Ondansetron, Dexamethasone, Scopolamine patch - Pre-op and Diphenhydramine  Airway Management Planned: Oral ETT  Additional Equipment: Arterial line and CVP  Intra-op Plan:   Post-operative Plan: Extubation in OR  Informed Consent: I have reviewed the patients History and Physical, chart, labs and discussed the procedure including the risks, benefits and alternatives for the proposed anesthesia with the patient or authorized representative who has indicated his/her understanding and acceptance.     Dental advisory given  Plan Discussed with: Anesthesiologist and CRNA  Anesthesia Plan Comments: (According to the RCRI he is a Class IV risk with 11% risk of a major cardiac event. The patient is aware of his risk. He does not need further cardiovascular testing prior to surgery. He is willing to proceed with this necessary surgery.  He is at risk for volume overload with excess IVF that may be needed during surgery. May require IV lasix following surgery.")     Anesthesia Quick Evaluation

## 2019-03-30 ENCOUNTER — Other Ambulatory Visit: Payer: Self-pay

## 2019-03-30 ENCOUNTER — Observation Stay (HOSPITAL_COMMUNITY): Payer: Medicaid Other

## 2019-03-30 ENCOUNTER — Encounter (HOSPITAL_COMMUNITY): Payer: Self-pay | Admitting: *Deleted

## 2019-03-30 ENCOUNTER — Telehealth: Payer: Self-pay | Admitting: Cardiovascular Disease

## 2019-03-30 ENCOUNTER — Observation Stay (HOSPITAL_COMMUNITY)
Admission: RE | Admit: 2019-03-30 | Discharge: 2019-03-31 | Disposition: A | Discharge status: Home / Self Care | Payer: Medicaid Other | Attending: Urology | Admitting: Urology

## 2019-03-30 ENCOUNTER — Ambulatory Visit (HOSPITAL_COMMUNITY): Payer: Medicaid Other | Admitting: Physician Assistant

## 2019-03-30 ENCOUNTER — Ambulatory Visit (HOSPITAL_COMMUNITY): Payer: Medicaid Other | Admitting: Anesthesiology

## 2019-03-30 ENCOUNTER — Encounter (HOSPITAL_COMMUNITY)
Admission: RE | Disposition: A | Discharge status: Home / Self Care | Payer: Self-pay | Source: Home / Self Care | Attending: Urology

## 2019-03-30 DIAGNOSIS — C642 Malignant neoplasm of left kidney, except renal pelvis: Principal | ICD-10-CM | POA: Insufficient documentation

## 2019-03-30 DIAGNOSIS — I110 Hypertensive heart disease with heart failure: Secondary | ICD-10-CM | POA: Diagnosis not present

## 2019-03-30 DIAGNOSIS — N2889 Other specified disorders of kidney and ureter: Secondary | ICD-10-CM | POA: Diagnosis present

## 2019-03-30 DIAGNOSIS — I509 Heart failure, unspecified: Secondary | ICD-10-CM | POA: Insufficient documentation

## 2019-03-30 DIAGNOSIS — I69311 Memory deficit following cerebral infarction: Secondary | ICD-10-CM | POA: Insufficient documentation

## 2019-03-30 DIAGNOSIS — E7800 Pure hypercholesterolemia, unspecified: Secondary | ICD-10-CM | POA: Diagnosis not present

## 2019-03-30 DIAGNOSIS — Z981 Arthrodesis status: Secondary | ICD-10-CM | POA: Insufficient documentation

## 2019-03-30 DIAGNOSIS — F17200 Nicotine dependence, unspecified, uncomplicated: Secondary | ICD-10-CM | POA: Insufficient documentation

## 2019-03-30 DIAGNOSIS — Z79899 Other long term (current) drug therapy: Secondary | ICD-10-CM | POA: Insufficient documentation

## 2019-03-30 DIAGNOSIS — Z452 Encounter for adjustment and management of vascular access device: Secondary | ICD-10-CM

## 2019-03-30 DIAGNOSIS — F419 Anxiety disorder, unspecified: Secondary | ICD-10-CM | POA: Diagnosis not present

## 2019-03-30 DIAGNOSIS — Z96643 Presence of artificial hip joint, bilateral: Secondary | ICD-10-CM | POA: Insufficient documentation

## 2019-03-30 DIAGNOSIS — Z7984 Long term (current) use of oral hypoglycemic drugs: Secondary | ICD-10-CM | POA: Insufficient documentation

## 2019-03-30 DIAGNOSIS — E119 Type 2 diabetes mellitus without complications: Secondary | ICD-10-CM | POA: Insufficient documentation

## 2019-03-30 DIAGNOSIS — M1990 Unspecified osteoarthritis, unspecified site: Secondary | ICD-10-CM | POA: Insufficient documentation

## 2019-03-30 DIAGNOSIS — I69328 Other speech and language deficits following cerebral infarction: Secondary | ICD-10-CM | POA: Diagnosis not present

## 2019-03-30 HISTORY — PX: ROBOTIC ASSITED PARTIAL NEPHRECTOMY: SHX6087

## 2019-03-30 LAB — CBC
HCT: 37.9 % — ABNORMAL LOW (ref 39.0–52.0)
Hemoglobin: 12.5 g/dL — ABNORMAL LOW (ref 13.0–17.0)
MCH: 29.7 pg (ref 26.0–34.0)
MCHC: 33 g/dL (ref 30.0–36.0)
MCV: 90 fL (ref 80.0–100.0)
Platelets: 242 10*3/uL (ref 150–400)
RBC: 4.21 MIL/uL — ABNORMAL LOW (ref 4.22–5.81)
RDW: 14.6 % (ref 11.5–15.5)
WBC: 6.8 10*3/uL (ref 4.0–10.5)
nRBC: 0 % (ref 0.0–0.2)

## 2019-03-30 LAB — TYPE AND SCREEN
ABO/RH(D): O POS
Antibody Screen: NEGATIVE

## 2019-03-30 LAB — HEMOGLOBIN AND HEMATOCRIT, BLOOD
HCT: 38.3 % — ABNORMAL LOW (ref 39.0–52.0)
Hemoglobin: 12.3 g/dL — ABNORMAL LOW (ref 13.0–17.0)

## 2019-03-30 LAB — GLUCOSE, CAPILLARY
Glucose-Capillary: 164 mg/dL — ABNORMAL HIGH (ref 70–99)
Glucose-Capillary: 174 mg/dL — ABNORMAL HIGH (ref 70–99)
Glucose-Capillary: 155 mg/dL — ABNORMAL HIGH (ref 70–99)
Glucose-Capillary: 120 mg/dL — ABNORMAL HIGH (ref 70–99)

## 2019-03-30 SURGERY — NEPHRECTOMY, PARTIAL, ROBOT-ASSISTED
Anesthesia: General | Laterality: Left

## 2019-03-30 MED ORDER — FENTANYL CITRATE (PF) 100 MCG/2ML IJ SOLN
INTRAMUSCULAR | Status: AC
  Filled 2019-03-30: qty 2

## 2019-03-30 MED ORDER — LACTATED RINGERS IV SOLN
INTRAVENOUS | Status: DC | PRN
  Administered 2019-03-30 (×2): via INTRAVENOUS

## 2019-03-30 MED ORDER — MIDAZOLAM HCL 2 MG/2ML IJ SOLN
INTRAMUSCULAR | Status: AC
  Filled 2019-03-30: qty 2

## 2019-03-30 MED ORDER — FENTANYL CITRATE (PF) 100 MCG/2ML IJ SOLN
25.0000 ug | INTRAMUSCULAR | Status: DC | PRN
  Administered 2019-03-30: 12:00:00 50 ug via INTRAVENOUS

## 2019-03-30 MED ORDER — FENTANYL CITRATE (PF) 100 MCG/2ML IJ SOLN
INTRAMUSCULAR | Status: DC | PRN
  Administered 2019-03-30: 100 ug via INTRAVENOUS
  Administered 2019-03-30: 50 ug via INTRAVENOUS
  Administered 2019-03-30 (×2): 100 ug via INTRAVENOUS
  Administered 2019-03-30 (×3): 50 ug via INTRAVENOUS

## 2019-03-30 MED ORDER — FENTANYL CITRATE (PF) 100 MCG/2ML IJ SOLN: 50.0000 ug | INTRAMUSCULAR | Status: DC

## 2019-03-30 MED ORDER — BUPIVACAINE-EPINEPHRINE (PF) 0.25% -1:200000 IJ SOLN
INTRAMUSCULAR | Status: AC
  Filled 2019-03-30: qty 30

## 2019-03-30 MED ORDER — FENTANYL CITRATE (PF) 250 MCG/5ML IJ SOLN
INTRAMUSCULAR | Status: AC
  Filled 2019-03-30: qty 5

## 2019-03-30 MED ORDER — ACETAMINOPHEN 500 MG PO TABS
1000.0000 mg | ORAL_TABLET | Freq: Once | ORAL | Status: AC
  Administered 2019-03-30: 08:00:00 1000 mg via ORAL
  Filled 2019-03-30: qty 2

## 2019-03-30 MED ORDER — EVICEL 5 ML EX KIT
PACK | Freq: Once | CUTANEOUS | Status: DC
  Filled 2019-03-30: qty 1

## 2019-03-30 MED ORDER — LABETALOL HCL 5 MG/ML IV SOLN
10.0000 mg | INTRAVENOUS | Status: DC | PRN
  Filled 2019-03-30: qty 4

## 2019-03-30 MED ORDER — DIPHENHYDRAMINE HCL 50 MG/ML IJ SOLN: 12.5000 mg | Freq: Four times a day (QID) | INTRAMUSCULAR | Status: DC | PRN

## 2019-03-30 MED ORDER — SUGAMMADEX SODIUM 500 MG/5ML IV SOLN
INTRAVENOUS | Status: AC
  Filled 2019-03-30: qty 5

## 2019-03-30 MED ORDER — ATORVASTATIN CALCIUM 20 MG PO TABS
20.0000 mg | ORAL_TABLET | Freq: Every day | ORAL | Status: DC
  Administered 2019-03-30: 21:00:00 20 mg via ORAL
  Filled 2019-03-30: qty 1

## 2019-03-30 MED ORDER — PROMETHAZINE HCL 25 MG/ML IJ SOLN
6.2500 mg | INTRAMUSCULAR | Status: DC | PRN
  Administered 2019-03-30: 13:00:00 6.25 mg via INTRAVENOUS

## 2019-03-30 MED ORDER — LACTATED RINGERS IV SOLN
INTRAVENOUS | Status: DC
  Administered 2019-03-30: 08:00:00 via INTRAVENOUS

## 2019-03-30 MED ORDER — ONDANSETRON HCL 4 MG/2ML IJ SOLN
4.0000 mg | INTRAMUSCULAR | Status: DC | PRN
  Administered 2019-03-30 – 2019-03-31 (×3): 4 mg via INTRAVENOUS
  Filled 2019-03-30 (×3): qty 2

## 2019-03-30 MED ORDER — DEXAMETHASONE SODIUM PHOSPHATE 10 MG/ML IJ SOLN
INTRAMUSCULAR | Status: AC
  Filled 2019-03-30: qty 1

## 2019-03-30 MED ORDER — BUPIVACAINE-EPINEPHRINE 0.25% -1:200000 IJ SOLN
INTRAMUSCULAR | Status: DC | PRN
  Administered 2019-03-30: 6 mL
  Administered 2019-03-30: 24 mL

## 2019-03-30 MED ORDER — PHENYLEPHRINE HCL (PRESSORS) 10 MG/ML IV SOLN
INTRAVENOUS | Status: AC
  Filled 2019-03-30: qty 1

## 2019-03-30 MED ORDER — DOCUSATE SODIUM 100 MG PO CAPS
100.0000 mg | ORAL_CAPSULE | Freq: Two times a day (BID) | ORAL | Status: DC
  Administered 2019-03-30 – 2019-03-31 (×2): 100 mg via ORAL
  Filled 2019-03-30 (×2): qty 1

## 2019-03-30 MED ORDER — ETOMIDATE 2 MG/ML IV SOLN
INTRAVENOUS | Status: DC | PRN
  Administered 2019-03-30: 20 mg via INTRAVENOUS

## 2019-03-30 MED ORDER — LACTATED RINGERS IR SOLN
Status: DC | PRN
  Administered 2019-03-30: 1000 mL

## 2019-03-30 MED ORDER — LABETALOL HCL 5 MG/ML IV SOLN
INTRAVENOUS | Status: DC | PRN
  Administered 2019-03-30 (×4): 5 mg via INTRAVENOUS

## 2019-03-30 MED ORDER — ONDANSETRON HCL 4 MG/2ML IJ SOLN
INTRAMUSCULAR | Status: AC
  Filled 2019-03-30: qty 2

## 2019-03-30 MED ORDER — ONDANSETRON HCL 4 MG PO TABS: 4.0000 mg | ORAL_TABLET | Freq: Three times a day (TID) | ORAL | 0 refills | Status: DC | PRN

## 2019-03-30 MED ORDER — DIPHENHYDRAMINE HCL 12.5 MG/5ML PO ELIX: 12.5000 mg | ORAL_SOLUTION | Freq: Four times a day (QID) | ORAL | Status: DC | PRN

## 2019-03-30 MED ORDER — PROMETHAZINE HCL 25 MG/ML IJ SOLN
INTRAMUSCULAR | Status: AC
  Filled 2019-03-30: qty 1

## 2019-03-30 MED ORDER — HYDRALAZINE HCL 20 MG/ML IJ SOLN
INTRAMUSCULAR | Status: DC | PRN
  Administered 2019-03-30: 5 mg via INTRAVENOUS
  Administered 2019-03-30: 10 mg via INTRAVENOUS

## 2019-03-30 MED ORDER — HYDRALAZINE HCL 20 MG/ML IJ SOLN: 10.0000 mg | INTRAMUSCULAR | Status: DC | PRN

## 2019-03-30 MED ORDER — MIDAZOLAM HCL 2 MG/2ML IJ SOLN
INTRAMUSCULAR | Status: AC
  Administered 2019-03-30: 08:00:00 2 mg
  Filled 2019-03-30: qty 2

## 2019-03-30 MED ORDER — OXYCODONE HCL 10 MG PO TABS: 10.0000 mg | ORAL_TABLET | Freq: Three times a day (TID) | ORAL | 0 refills | Status: DC | PRN

## 2019-03-30 MED ORDER — DEXAMETHASONE SODIUM PHOSPHATE 10 MG/ML IJ SOLN
INTRAMUSCULAR | Status: DC | PRN
  Administered 2019-03-30: 8 mg via INTRAVENOUS

## 2019-03-30 MED ORDER — MIDAZOLAM HCL 2 MG/2ML IJ SOLN: 1.0000 mg | INTRAMUSCULAR | Status: DC

## 2019-03-30 MED ORDER — HYDROMORPHONE HCL 1 MG/ML IJ SOLN
0.5000 mg | INTRAMUSCULAR | Status: DC | PRN
  Administered 2019-03-30: 16:00:00 0.5 mg via INTRAVENOUS
  Filled 2019-03-30 (×2): qty 1

## 2019-03-30 MED ORDER — SCOPOLAMINE 1 MG/3DAYS TD PT72
1.0000 | MEDICATED_PATCH | TRANSDERMAL | Status: DC
  Administered 2019-03-30: 08:00:00 1.5 mg via TRANSDERMAL
  Filled 2019-03-30: qty 1

## 2019-03-30 MED ORDER — GABAPENTIN 300 MG PO CAPS
300.0000 mg | ORAL_CAPSULE | Freq: Three times a day (TID) | ORAL | Status: DC
  Administered 2019-03-30 – 2019-03-31 (×4): 300 mg via ORAL
  Filled 2019-03-30 (×4): qty 1

## 2019-03-30 MED ORDER — OXYCODONE HCL 5 MG PO TABS
5.0000 mg | ORAL_TABLET | ORAL | Status: DC | PRN
  Administered 2019-03-31 (×2): 5 mg via ORAL
  Filled 2019-03-30 (×3): qty 1

## 2019-03-30 MED ORDER — BUPIVACAINE LIPOSOME 1.3 % IJ SUSP
20.0000 mL | Freq: Once | INTRAMUSCULAR | Status: AC
  Administered 2019-03-30: 20 mL
  Filled 2019-03-30: qty 20

## 2019-03-30 MED ORDER — SUCCINYLCHOLINE CHLORIDE 200 MG/10ML IV SOSY
PREFILLED_SYRINGE | INTRAVENOUS | Status: DC | PRN
  Administered 2019-03-30: 120 mg via INTRAVENOUS

## 2019-03-30 MED ORDER — SIMVASTATIN 40 MG PO TABS: 40.0000 mg | ORAL_TABLET | Freq: Every day | ORAL | Status: DC

## 2019-03-30 MED ORDER — MAGNESIUM CITRATE PO SOLN: 1.0000 | Freq: Once | ORAL | Status: DC

## 2019-03-30 MED ORDER — MIDAZOLAM HCL 5 MG/5ML IJ SOLN
INTRAMUSCULAR | Status: DC | PRN
  Administered 2019-03-30: 1 mg via INTRAVENOUS

## 2019-03-30 MED ORDER — INSULIN ASPART 100 UNIT/ML ~~LOC~~ SOLN
0.0000 [IU] | Freq: Three times a day (TID) | SUBCUTANEOUS | Status: DC
  Administered 2019-03-30 – 2019-03-31 (×2): 3 [IU] via SUBCUTANEOUS
  Administered 2019-03-31: 2 [IU] via SUBCUTANEOUS

## 2019-03-30 MED ORDER — AMLODIPINE BESYLATE 10 MG PO TABS
10.0000 mg | ORAL_TABLET | Freq: Every day | ORAL | Status: DC
  Administered 2019-03-31: 11:00:00 10 mg via ORAL
  Filled 2019-03-30: qty 1

## 2019-03-30 MED ORDER — ROCURONIUM BROMIDE 10 MG/ML (PF) SYRINGE
PREFILLED_SYRINGE | INTRAVENOUS | Status: DC | PRN
  Administered 2019-03-30: 10 mg via INTRAVENOUS
  Administered 2019-03-30: 30 mg via INTRAVENOUS
  Administered 2019-03-30: 70 mg via INTRAVENOUS

## 2019-03-30 MED ORDER — LIDOCAINE 2% (20 MG/ML) 5 ML SYRINGE
INTRAMUSCULAR | Status: DC | PRN
  Administered 2019-03-30: 90 mg via INTRAVENOUS

## 2019-03-30 MED ORDER — SUGAMMADEX SODIUM 500 MG/5ML IV SOLN
INTRAVENOUS | Status: DC | PRN
  Administered 2019-03-30: 300 mg via INTRAVENOUS

## 2019-03-30 MED ORDER — ACETAMINOPHEN 10 MG/ML IV SOLN
1000.0000 mg | Freq: Four times a day (QID) | INTRAVENOUS | Status: AC
  Administered 2019-03-30 – 2019-03-31 (×4): 1000 mg via INTRAVENOUS
  Filled 2019-03-30 (×4): qty 100

## 2019-03-30 MED ORDER — PROPOFOL 10 MG/ML IV BOLUS
INTRAVENOUS | Status: AC
  Filled 2019-03-30: qty 40

## 2019-03-30 MED ORDER — STERILE WATER FOR IRRIGATION IR SOLN
Status: DC | PRN
  Administered 2019-03-30: 1000 mL

## 2019-03-30 MED ORDER — SODIUM CHLORIDE 0.45 % IV SOLN
INTRAVENOUS | Status: DC
  Administered 2019-03-30: 15:00:00 via INTRAVENOUS

## 2019-03-30 MED ORDER — PROPOFOL 10 MG/ML IV BOLUS
INTRAVENOUS | Status: DC | PRN
  Administered 2019-03-30: 40 mg via INTRAVENOUS
  Administered 2019-03-30 (×2): 50 mg via INTRAVENOUS

## 2019-03-30 MED ORDER — FENTANYL CITRATE (PF) 100 MCG/2ML IJ SOLN
INTRAMUSCULAR | Status: AC
  Administered 2019-03-30: 08:00:00 100 ug
  Filled 2019-03-30: qty 2

## 2019-03-30 MED ORDER — ONDANSETRON HCL 4 MG/2ML IJ SOLN
INTRAMUSCULAR | Status: DC | PRN
  Administered 2019-03-30: 4 mg via INTRAVENOUS

## 2019-03-30 MED ORDER — BELLADONNA ALKALOIDS-OPIUM 16.2-60 MG RE SUPP: 1.0000 | Freq: Four times a day (QID) | RECTAL | Status: DC | PRN

## 2019-03-30 MED ORDER — DOCUSATE SODIUM 100 MG PO CAPS: 100.0000 mg | ORAL_CAPSULE | Freq: Two times a day (BID) | ORAL | Status: DC

## 2019-03-30 MED ORDER — BACITRACIN-NEOMYCIN-POLYMYXIN 400-5-5000 EX OINT: 1.0000 "application " | TOPICAL_OINTMENT | Freq: Three times a day (TID) | CUTANEOUS | Status: DC | PRN

## 2019-03-30 MED ORDER — LABETALOL HCL 200 MG PO TABS
200.0000 mg | ORAL_TABLET | Freq: Two times a day (BID) | ORAL | Status: DC
  Administered 2019-03-30 – 2019-03-31 (×2): 200 mg via ORAL
  Filled 2019-03-30 (×2): qty 1

## 2019-03-30 SURGICAL SUPPLY — 78 items
ADH SKN CLS APL DERMABOND .7 (GAUZE/BANDAGES/DRESSINGS) ×1
AGENT HMST KT MTR STRL THRMB (HEMOSTASIS)
APL ESCP 34 STRL LF DISP (HEMOSTASIS)
APL PRP STRL LF DISP 70% ISPRP (MISCELLANEOUS) ×1
APPLICATOR SURGIFLO ENDO (HEMOSTASIS) IMPLANT
BAG LAPAROSCOPIC 12 15 PORT 16 (BASKET) IMPLANT
BAG RETRIEVAL 12/15 (BASKET) ×2
BAG SPEC RTRVL LRG 6X4 10 (ENDOMECHANICALS) ×1
CHLORAPREP W/TINT 26 (MISCELLANEOUS) ×2 IMPLANT
CLIP SUT LAPRA TY ABSORB (SUTURE) ×2 IMPLANT
CLIP VESOCCLUDE MED LG 6/CT (CLIP) IMPLANT
CLIP VESOLOCK LG 6/CT PURPLE (CLIP) ×2 IMPLANT
CLIP VESOLOCK MED LG 6/CT (CLIP) ×4 IMPLANT
CLIP VESOLOCK XL 6/CT (CLIP) IMPLANT
CONT SPEC 4OZ CLIKSEAL STRL BL (MISCELLANEOUS) ×1 IMPLANT
COVER SURGICAL LIGHT HANDLE (MISCELLANEOUS) ×2 IMPLANT
COVER TIP SHEARS 8 DVNC (MISCELLANEOUS) ×1 IMPLANT
COVER TIP SHEARS 8MM DA VINCI (MISCELLANEOUS) ×1
COVER WAND RF STERILE (DRAPES) ×2 IMPLANT
CUTTER ECHEON FLEX ENDO 45 340 (ENDOMECHANICALS) ×1 IMPLANT
DECANTER SPIKE VIAL GLASS SM (MISCELLANEOUS) ×2 IMPLANT
DERMABOND ADVANCED (GAUZE/BANDAGES/DRESSINGS) ×1
DERMABOND ADVANCED .7 DNX12 (GAUZE/BANDAGES/DRESSINGS) ×1 IMPLANT
DRAIN CHANNEL 15F RND FF 3/16 (WOUND CARE) IMPLANT
DRAPE ARM DVNC X/XI (DISPOSABLE) ×4 IMPLANT
DRAPE COLUMN DVNC XI (DISPOSABLE) ×1 IMPLANT
DRAPE DA VINCI XI ARM (DISPOSABLE) ×4
DRAPE DA VINCI XI COLUMN (DISPOSABLE) ×1
DRAPE INCISE IOBAN 66X45 STRL (DRAPES) ×2 IMPLANT
DRAPE SHEET LG 3/4 BI-LAMINATE (DRAPES) ×2 IMPLANT
ELECT PENCIL ROCKER SW 15FT (MISCELLANEOUS) ×2 IMPLANT
ELECT REM PT RETURN 15FT ADLT (MISCELLANEOUS) ×2 IMPLANT
EVACUATOR SILICONE 100CC (DRAIN) IMPLANT
GLOVE BIO SURGEON STRL SZ 6.5 (GLOVE) ×2 IMPLANT
GLOVE BIOGEL M STRL SZ7.5 (GLOVE) ×4 IMPLANT
GOWN STRL REUS W/TWL LRG LVL3 (GOWN DISPOSABLE) ×2 IMPLANT
GOWN STRL REUS W/TWL XL LVL3 (GOWN DISPOSABLE) ×4 IMPLANT
HEMOSTAT SURGICEL 4X8 (HEMOSTASIS) IMPLANT
IRRIG SUCT STRYKERFLOW 2 WTIP (MISCELLANEOUS) ×2
IRRIGATION SUCT STRKRFLW 2 WTP (MISCELLANEOUS) ×1 IMPLANT
KIT BASIN OR (CUSTOM PROCEDURE TRAY) ×2 IMPLANT
KIT TURNOVER KIT A (KITS) IMPLANT
MARKER SKIN DUAL TIP RULER LAB (MISCELLANEOUS) ×2 IMPLANT
NDL INSUFFLATION 14GA 120MM (NEEDLE) ×2 IMPLANT
NEEDLE INSUFFLATION 14GA 120MM (NEEDLE) ×4 IMPLANT
PORT ACCESS TROCAR AIRSEAL 12 (TROCAR) ×1 IMPLANT
PORT ACCESS TROCAR AIRSEAL 5M (TROCAR) ×1
POUCH SPECIMEN RETRIEVAL 10MM (ENDOMECHANICALS) ×2 IMPLANT
PROTECTOR NERVE ULNAR (MISCELLANEOUS) ×4 IMPLANT
RELOAD STAPLE 45 2.6 WHT THIN (STAPLE) IMPLANT
RELOAD STAPLE 60 2.6 WHT THN (STAPLE) IMPLANT
RELOAD STAPLER WHITE 60MM (STAPLE) IMPLANT
SEAL CANN UNIV 5-8 DVNC XI (MISCELLANEOUS) ×3 IMPLANT
SEAL XI 5MM-8MM UNIVERSAL (MISCELLANEOUS) ×4
SET TRI-LUMEN FLTR TB AIRSEAL (TUBING) ×2 IMPLANT
SOLUTION ELECTROLUBE (MISCELLANEOUS) ×2 IMPLANT
STAPLE ECHEON FLEX 60 POW ENDO (STAPLE) IMPLANT
STAPLE RELOAD 45 WHT (STAPLE) ×3 IMPLANT
STAPLE RELOAD 45MM WHITE (STAPLE) ×6
STAPLER RELOAD WHITE 60MM (STAPLE)
SURGIFLO W/THROMBIN 8M KIT (HEMOSTASIS) IMPLANT
SUT ETHILON 2 0 PSLX (SUTURE) IMPLANT
SUT MNCRL AB 4-0 PS2 18 (SUTURE) ×4 IMPLANT
SUT PDS AB 0 CT1 36 (SUTURE) ×4 IMPLANT
SUT V-LOC BARB 180 2/0GR6 GS22 (SUTURE)
SUT VIC AB 1 CT1 27 (SUTURE) ×4
SUT VIC AB 1 CT1 27XBRD ANTBC (SUTURE) ×1 IMPLANT
SUT VICRYL 0 UR6 27IN ABS (SUTURE) ×3 IMPLANT
SUT VLOC BARB 180 ABS3/0GR12 (SUTURE) ×2
SUTURE V-LC BRB 180 2/0GR6GS22 (SUTURE) IMPLANT
SUTURE VLOC BRB 180 ABS3/0GR12 (SUTURE) ×1 IMPLANT
TIP RIGID 35CM EVICEL (HEMOSTASIS) IMPLANT
TOWEL OR 17X26 10 PK STRL BLUE (TOWEL DISPOSABLE) ×2 IMPLANT
TOWEL OR NON WOVEN STRL DISP B (DISPOSABLE) ×2 IMPLANT
TRAY FOLEY MTR SLVR 16FR STAT (SET/KITS/TRAYS/PACK) ×2 IMPLANT
TRAY LAPAROSCOPIC (CUSTOM PROCEDURE TRAY) ×2 IMPLANT
TROCAR BLADELESS OPT 5 100 (ENDOMECHANICALS) IMPLANT
WATER STERILE IRR 1000ML POUR (IV SOLUTION) ×2 IMPLANT

## 2019-03-30 NOTE — Progress Notes (Signed)
Assisted Dr. Tobias Alexander with right, ultrasound guided IJ central line. Side rails up, monitors on throughout procedure. See vital signs in flow sheet. Tolerated Procedure well.

## 2019-03-30 NOTE — Anesthesia Procedure Notes (Signed)
Central Venous Catheter Insertion Performed by: Duane Boston, MD, anesthesiologist Start/End4/29/2020 7:51 AM, 03/30/2019 8:01 AM Patient location: Pre-op. Preanesthetic checklist: patient identified, IV checked, site marked, risks and benefits discussed, surgical consent, monitors and equipment checked, pre-op evaluation, timeout performed and anesthesia consent Position: Trendelenburg Lidocaine 1% used for infiltration and patient sedated Hand hygiene performed , maximum sterile barriers used  and Seldinger technique used Catheter size: 8 Fr Total catheter length 16. Central line was placed.Double lumen Procedure performed using ultrasound guided technique. Ultrasound Notes:anatomy identified, needle tip was noted to be adjacent to the nerve/plexus identified, no ultrasound evidence of intravascular and/or intraneural injection and image(s) printed for medical record Attempts: 1 Following insertion, dressing applied, line sutured and Biopatch. Post procedure assessment: blood return through all ports, free fluid flow and no air  Patient tolerated the procedure well with no immediate complications.

## 2019-03-30 NOTE — Progress Notes (Signed)
Patient able to tolerate a few sips of tea, one bite of jello, and a sip of chicken broth before stating he felt slightly nauseous. He is complaining of abdominal pain and when asked if he feels like walking he states "not right now, I'm in too much pain." Pt declines pain medication at this time. Educated patient on importance of ambulating this evening after his surgery, he states "I will try later." Will continue to monitor and pass information onto oncoming RN.

## 2019-03-30 NOTE — Op Note (Addendum)
Operative Note  Preoperative diagnosis:  1.  4.3 cm solid left renal mass  Postoperative diagnosis: Same  Procedure(s): 1.  Robot-assisted laparoscopic left nephrectomy (adrenal sparing) 2.  Intraoperative ultrasound of single retroperitoneal organ  Surgeon: Ellison Hughs, MD  Assistants: Debbrah Alar, PA-C  An assistant was required for this surgical procedure.  The duties of the assistant included but were not limited to suctioning, passing suture, camera manipulation, retraction.  This procedure would not be able to be performed without an Environmental consultant.   Anesthesia:  General  Complications:  None  EBL: 100 mL Fluids: 1800 mL saline  Specimens: 1.  Left kidney  Drains/Catheters: 1.  Foley catheter  Intraoperative findings:   1. Intraoperative ultrasound revealed that the left renal mass in question was directly several vessels of the renal hilum as well as the collecting system.  Given these findings and due to the concern for hemorrhage following left kidney reconstruction as well as cancer control, the decision was made to proceed with nephrectomy  Indication:  Raymond Barrera. is a 58 y.o. male with an incidentally identified 4.3 cm solid and enhancing left renal mass, with features concerning for renal cell carcinoma.  He has been consented for the above procedures, voices understanding and wishes to proceed.  Description of procedure:  After informed consent was obtained, the patient was brought to the operating room and general endotracheal anesthesia was administered.  The patient was then placed in the right lateral decubitus position and prepped and draped in usual sterile fashion.  A timeout was performed.  An 8 mm incision was then made lateral to the left rectus muscle at the level of the left 12th rib.  Abdominal access was obtained via a Veress needle.  The abdominal cavity was then insufflated up to 15 mmHg.  An 8 mm port was then introduced into the  abdominal cavity.  Inspection of the port entry site by the robotic camera revealed no adjacent organ injury.  We then placed 3 additional 8 mm robotic ports to triangulate the left renal hilum.  A 12 mm assistant port was then placed between the carmera port and 3rd robotic arm.  The white line of Toldt along the descending colon was incised sharply and the colon, along with its mesocolonic fat, was reflected medially until the aorta was identified.  We then made a small window adjacent to the lower pole of the left kidney, identifying the left psoas muscle, left ureter and left gonadal vein.  The left ureter and gonadal vein were then reflected anteriorly allowing [place] to then incised the perihilar attachments using electrocautery.  We encountered a small lumbar vein adjacent to the insertion of the left gonadal vein into the left renal vein.  This lumbar vein was ligated with hemo-lock clips in 2 places and incised sharply.  This provided [place] excellent exposure to the left renal hilum.    A vertical incision was then made along the anterior surface of Gerota's fascia and the left renal mass in question was fully circumscribed.  Intraoperative ultrasound was performed, with the findings listed above.  A 45 mm endovascular stapler was then used to ligate the left renal artery and then the left renal vein, achieving excellent hemostasis.  The remaining peri-renal attachments were then excised using accommodation of blunt dissection and electrocautery.  The left adrenal gland was spared.  The endovascular stapler was then used to ligate the left gonadal vein and left ureter.  Once the kidney was freely  mobile, it was placed in Endo Catch bag to be be retrieved at the conclusion of the case.  The robot was then de-docked.  A left lower quadrant Gibson incision was then made and the mass was removed within the Endo Catch bag.  The fascia within the midline assistant port was then closed using an interrupted 0  Vicryl suture.  The fascia of the internal and external oblique was then closed using a 0 PDS suture in a running fashion.  The subcutaneous tissue within the [facility] incision was then closed using a running 0 Vicryl suture.  All skin incisions were then closed using 4-0 Monocryl and then dressed with Dermabond.  The patient tolerated the procedure well and was transferred to the postanesthesia in stable condition.     Plan: Monitor on the floor overnight.  Foley catheter in the morning.  Repeat labs on postop day 1.

## 2019-03-30 NOTE — Anesthesia Postprocedure Evaluation (Signed)
Anesthesia Post Note  Patient: Newt Levingston.  Procedure(s) Performed: XI ROBOTIC ASSITED PARTIAL NEPHRECTOMY POSSIBLE RADICAL NEPHRECTOMY (Left )     Patient location during evaluation: PACU Anesthesia Type: General Level of consciousness: sedated Pain management: pain level controlled Vital Signs Assessment: post-procedure vital signs reviewed and stable Respiratory status: spontaneous breathing and respiratory function stable Cardiovascular status: stable Postop Assessment: no apparent nausea or vomiting Anesthetic complications: no    Last Vitals:  Vitals:   03/30/19 1329 03/30/19 1356  BP:  (!) 160/100  Pulse:  79  Resp:  17  Temp: 36.4 C (!) 36.3 C  SpO2:  100%    Last Pain:  Vitals:   03/30/19 1356  TempSrc: Oral  PainSc: 0-No pain                 Guinn Delarosa DANIEL

## 2019-03-30 NOTE — Telephone Encounter (Signed)
Called [facility] to follow up on [place] PA that was previously submitted. Spoke with Caryl Pina. She  could not find a request in the system. I asked if they could pull it up by the phone confirmation # and she said unfortunately not. So  I re submitted the PA information a second time. I questioned how long this would take  Since the patient has been waiting on the approval to get medication filled.she said they have a 24 hour turn around. Patient's wife notified.

## 2019-03-30 NOTE — Telephone Encounter (Signed)
Per wife, patient is still unable to get his entresto filled. Advised that message would be sent to staff handling prior auths.

## 2019-03-30 NOTE — Telephone Encounter (Signed)
New Message:    Patient wife calling concerning his medication. She has called a few time and states that her husband is having trouble with getting a refill please call patient wife concering this matter. Please call this number if there is not a answers on this pone. [phone] her husband cell phone. Would like for some one to call her today.

## 2019-03-30 NOTE — H&P (Signed)
Urology Preoperative H&P   Chief Complaint: Left renal mass  History of Present Illness: Raymond Linch. is a 58 y.o. male referred by Dr. Noemi Chapel after the patient was found to have a 4 cm solid and enhancing left renal mass on CT from 07/10/18 during a workup for an abdominal pain following transabdominal spinal fusion this July with Dr. Phylliss Bob.   He was seen at the [facility] ED on 10/24/2018 for left-sided flank pain and was found to have a 4.3 cm solid and enhancing left renal mass concerning for RCC on CT.  His pain has since improved. He also notes improvement in his back pain and is much more ambulatory now. He is voiding without difficulty denies dysuria or hematuria.   PMHx: Tobacco use (1ppd for 40+ years), Anxiety, type 2 diabetes, hypertension, sleep apnea, stroke/TIA (01/2017--currently on aspirin) and CHF   PSHx: Bilateral hip replacement, transabdominal spinal fusion (05/2018), neck surgery, left ankle surgery   CT ABD/PEL WITH CONTRAST 10/24/2018 (images reviewed)  IMPRESSION:  1. No acute intra-abdominal or pelvic finding by CT.     2. 4.3 cm solid enhancing left renal lower pole mass compatible with  renal cell carcinoma. Recommend nonemergent outpatient urology  consultation.     3. Aortoiliac atherosclerosis without aneurysm   Past Medical History:  Diagnosis Date  . Anxiety    due to the stroke  . Arthritis   . Back pain    hx of buldging disc  . Diabetes mellitus without complication ([place])    takes Metformin daily  . High cholesterol    takes Zocor daily  . Hypertension    takes Benazepril and HCTZ  daily  . Joint pain   . Joint swelling   . Memory impairment    occassional - from stroke  . Myocardial infarction ([place]) 1987  . Pneumonia    hx of-80's  . PONV (postoperative nausea and vomiting)    took a while for him to wake up after previous anesthesia  . Shortness of breath dyspnea    do to pain  . Sleep apnea    never had a sleep  study,but states Dr. Cindie Laroche says he has it  . Slurred speech   . Stroke ([place]) 08/2013   7 mini-strokes, last stroke 2017  . TIA (transient ischemic attack) 2014   x 7   . Urinary frequency     Past Surgical History:  Procedure Laterality Date  . ABDOMINAL EXPOSURE N/A 06/16/2018   Procedure: ABDOMINAL EXPOSURE;  Surgeon: Rosetta Posner, MD;  Location: [facility];  Service: Vascular;  Laterality: N/A;  . ANKLE SURGERY  2008   left ankle-otif-[facility]  . ANTERIOR LUMBAR FUSION Bilateral 06/16/2018   Procedure: LUMBAR 4-5 LUMBAR 5-SACRUM 1 ANTERIOR LUMBAR INTERBODY FUSION WITH INSTRUMENTATION AND ALLOGRAFT;  Surgeon: Phylliss Bob, MD;  Location: [place];  Service: Orthopedics;  Laterality: Bilateral;  . BACK SURGERY    . HEMATOMA EVACUATION Left 08/13/2018   Procedure: EVACUATION HEMATOMA LEFT ABDOMINAL WALL;  Surgeon: Rosetta Posner, MD;  Location: [facility];  Service: Vascular;  Laterality: Left;  . IR RADIOLOGIST EVAL & MGMT  07/27/2018  . IR US GUIDE BX ASP/DRAIN  07/14/2018  . JOINT REPLACEMENT     both hips replaced   . LUMBAR LAMINECTOMY/DECOMPRESSION MICRODISCECTOMY Right 10/17/2014   Procedure: LUMBAR LAMINECTOMY/DECOMPRESSION MICRODISCECTOMY 2 LEVELS;  Surgeon: [name], MD;  Location: [place] [facility];  Service: Neurosurgery;  Laterality: Right;  Right L45 L5S1 laminectomy  and foraminotomy  . MASS EXCISION  09/13/2012   Procedure: EXCISION MASS;  Surgeon: Jamesetta So, MD;  Location: [facility];  Service: General;  Laterality: N/A;  . TONSILLECTOMY    . TOTAL HIP ARTHROPLASTY Right 03/20/2015  . TOTAL HIP ARTHROPLASTY Right 03/20/2015   Procedure: TOTAL HIP ARTHROPLASTY ANTERIOR APPROACH;  Surgeon: Renette Butters, MD;  Location: [employer];  Service: Orthopedics;  Laterality: Right;  . TOTAL HIP ARTHROPLASTY Left 01/08/2016   Procedure: TOTAL HIP ARTHROPLASTY ANTERIOR APPROACH;  Surgeon: Renette Butters, MD;  Location: [place];  Service: Orthopedics;  Laterality: Left;    Allergies:  Allergies   Allergen Reactions  . Poison Ivy Extract [Poison Ivy Extract] Itching and Rash    Family History  Problem Relation Age of Onset  . Heart disease Father     Social History:  reports that he has been smoking cigarettes. He has a 11.75 pack-year smoking history. He has never used smokeless tobacco. He reports previous drug use. Drug: Cocaine. He reports that he does not drink alcohol.  ROS: A complete review of systems was performed.  All systems are negative except for pertinent findings as noted.  Physical Exam:  Vital signs in last 24 hours: Temp:  [98.1 F (36.7 C)] 98.1 F (36.7 C) (04/29 0636) Pulse Rate:  [81] 81 (04/29 0636) Resp:  [18] 18 (04/29 0636) BP: (138)/(101) 138/101 (04/29 0636) SpO2:  [96 %] 96 % (04/29 0636) Constitutional:  Alert and oriented, No acute distress Cardiovascular: Regular rate and rhythm, No JVD Respiratory: Normal respiratory effort, Lungs clear bilaterally GI: Abdomen is soft, nontender, nondistended, no abdominal masses GU: No CVA tenderness Lymphatic: No lymphadenopathy Neurologic: Grossly intact, no focal deficits Psychiatric: Normal mood and affect  Laboratory Data:  No results for input(s): WBC, HGB, HCT, PLT in the last 72 hours.  No results for input(s): NA, K, CL, GLUCOSE, BUN, CALCIUM, CREATININE in the last 72 hours.  Invalid input(s): CO3   Results for orders placed or performed during the hospital encounter of 03/30/19 (from the past 24 hour(s))  Glucose, capillary     Status: Abnormal   Collection Time: 03/30/19  6:38 AM  Result Value Ref Range   Glucose-Capillary 164 (H) 70 - 99 mg/dL   Comment 1 Notify RN    Comment 2 Document in Chart    No results found for this or any previous visit (from the past 240 hour(s)).  Renal Function: No results for input(s): CREATININE in the last 168 hours. Estimated Creatinine Clearance: 98.9 mL/min (by C-G formula based on SCr of 1.18 mg/dL).  Radiologic Imaging: Nm Bone Scan 3  Phase  Result Date: 03/28/2019 CLINICAL DATA:  Chronic left hip pain since total hip arthroplasty in 2017. No acute injury. Right total hip arthroplasty in 2016. Evaluate prosthetic loosening. EXAM: NUCLEAR MEDICINE 3-PHASE BONE SCAN TECHNIQUE: Radionuclide angiographic images, immediate static blood pool images, and 3-hour delayed static images were obtained of the pelvis and hips after intravenous injection of radiopharmaceutical. RADIOPHARMACEUTICALS:  21.7 mCi Tc-58m MDP IV COMPARISON:  Hip radiographs 01/27/2019. Three-phase bone scan 10/12/2015. FINDINGS: Vascular phase: Normal symmetric perfusion bilaterally. Blood pool phase: There is normal blood pool activity in the pelvis and proximal thighs. Delayed phase: There is no suspicious activity surrounding either total hip arthroplasty. There is low-level activity along the tip of the femoral prosthesis bilaterally which appears fairly symmetric. This is typically stress mediated, and there is no evidence of prosthetic loosening. IMPRESSION: 1. No acute findings or  evidence of prosthetic loosening. The dynamic and blood pool images are normal. 2. Minimal, nearly symmetric activity along distal ends of the femoral prostheses bilaterally, likely stress mediated. Electronically Signed   By: Richardean Sale M.D.   On: 03/28/2019 16:17    I independently reviewed the above imaging studies.  Assessment and Plan Raymond Cicero. is a 58 y.o. male with a solid and enhancing left renal mass with features concerning for RCC  -I personally reviewed imaging results and films with the patient. We discussed that the LEFT renal mass in question has features concerning for malignancy. I explained the natural history of presumed renal cell carcinoma. I reviewed the AUA guidelines for evaluation and treatment of the small renal mass. The options of active surveillance, in situ tumor ablation, partial and radical nephrectomy was discussed. The risks of robotic partial  nephrectomy were discussed in detail including but not limited to: negative pathology, open conversion, completion/total  nephrectomy, infection of the urinary tract/skin/abdominal cavity, VTE, MI/CVA, lymphatic leak, injury to adjacent solid/hollow viscus organs, bleeding requiring a blood transfusion, catastrophic bleeding, hernia formation, need for postoperative angioembolization, urinary leak requiring stent/drain, contraction of COVID-19 and other imponderables.  He voices understanding and wishes to proceed.   Raymond Hughs, MD 03/30/2019, 7:11 AM  [facility] Pager: [phone]

## 2019-03-30 NOTE — Anesthesia Procedure Notes (Signed)
Arterial Line Insertion Performed by: Lissa Morales, CRNA  Preanesthetic checklist: patient identified, IV checked, site marked, risks and benefits discussed, surgical consent, monitors and equipment checked, pre-op evaluation, timeout performed and anesthesia consent Lidocaine 1% used for infiltration and patient sedated Right, radial was placed Catheter size: 20 G Hand hygiene performed  and maximum sterile barriers used   Attempts: 1 Procedure performed without using ultrasound guided technique. Ultrasound Notes:anatomy identified, needle tip was noted to be adjacent to the nerve/plexus identified and no ultrasound evidence of intravascular and/or intraneural injection Following insertion, dressing applied and Biopatch. Post procedure assessment: normal  Patient tolerated the procedure well with no immediate complications.

## 2019-03-30 NOTE — Anesthesia Procedure Notes (Signed)
Procedure Name: Intubation Date/Time: 03/30/2019 8:42 AM Performed by: Lissa Morales, CRNA Pre-anesthesia Checklist: Patient identified, Emergency Drugs available, Suction available and Patient being monitored Patient Re-evaluated:Patient Re-evaluated prior to induction Oxygen Delivery Method: Circle system utilized Preoxygenation: Pre-oxygenation with 100% oxygen Induction Type: IV induction Ventilation: Mask ventilation without difficulty Laryngoscope Size: Mac and 4 Grade View: Grade II Tube type: Oral Tube size: 8.0 mm Number of attempts: 1 Airway Equipment and Method: Stylet and Oral airway Placement Confirmation: ETT inserted through vocal cords under direct vision,  positive ETCO2 and breath sounds checked- equal and bilateral Secured at: 23 cm Tube secured with: Tape Dental Injury: Teeth and Oropharynx as per pre-operative assessment  Comments: Very large tongue and large epiglottis

## 2019-03-30 NOTE — Progress Notes (Signed)
Post-op note  Subjective: The patient is doing well.  Pain is well controlled and he is very sleepy. Good UOP.  BP has remained elevated.    Objective: Vital signs in last 24 hours: Temp:  [97.5 F (36.4 C)-98.1 F (36.7 C)] 97.5 F (36.4 C) (04/29 1140) Pulse Rate:  [67-81] 74 (04/29 1300) Resp:  [11-22] 14 (04/29 1300) BP: (138-177)/(86-111) 163/100 (04/29 1245) SpO2:  [95 %-100 %] 95 % (04/29 1300) Arterial Line BP: (144-200)/(75-124) 159/87 (04/29 1230) Weight:  [125.4 kg] 125.4 kg (04/29 0730)  Intake/Output from previous day: No intake/output data recorded. Intake/Output this shift: Total I/O In: 1800 [I.V.:1800] Out: 150 [Urine:50; Blood:100]  Physical Exam:  General: Alert and oriented. Sleepy Abdomen: Soft, Nondistended. Incisions: Clean and dry. Urine: clear  Lab Results: Recent Labs    03/30/19 0720 03/30/19 1154  HGB 12.5* 12.3*  HCT 37.9* 38.3*    Assessment/Plan: POD#0   1) Continue to monitor  2) DVT prophy, clears, IS, amb, pain control  3) Resume home BP meds and utilize IV meds prn for systolic >021   LOS: 0 days   Debbrah Alar 03/30/2019, 1:03 PM

## 2019-03-30 NOTE — Transfer of Care (Signed)
Immediate Anesthesia Transfer of Care Note  Patient: Raymond Barrera.  Procedure(s) Performed: XI ROBOTIC ASSITED PARTIAL NEPHRECTOMY POSSIBLE RADICAL NEPHRECTOMY (Left )  Patient Location: PACU  Anesthesia Type:General  Level of Consciousness: awake, alert , oriented and patient cooperative  Airway & Oxygen Therapy: Patient Spontanous Breathing and Patient connected to face mask oxygen  Post-op Assessment: Report given to RN, Post -op Vital signs reviewed and stable and Patient moving all extremities X 4  Post vital signs: stable  Last Vitals:  Vitals Value Taken Time  BP 174/108 03/30/2019 11:45 AM  Temp    Pulse 65 03/30/2019 11:53 AM  Resp 12 03/30/2019 11:53 AM  SpO2 99 % 03/30/2019 11:53 AM  Vitals shown include unvalidated device data.  Last Pain:  Vitals:   03/30/19 1140  TempSrc:   PainSc: (P) 10-Worst pain ever         Complications: No apparent anesthesia complications

## 2019-03-31 ENCOUNTER — Encounter (HOSPITAL_COMMUNITY): Payer: Self-pay | Admitting: Urology

## 2019-03-31 DIAGNOSIS — C642 Malignant neoplasm of left kidney, except renal pelvis: Secondary | ICD-10-CM | POA: Diagnosis not present

## 2019-03-31 LAB — HEMOGLOBIN AND HEMATOCRIT, BLOOD
HCT: 39.2 % (ref 39.0–52.0)
Hemoglobin: 12.6 g/dL — ABNORMAL LOW (ref 13.0–17.0)

## 2019-03-31 LAB — BASIC METABOLIC PANEL
Anion gap: 8 (ref 5–15)
BUN: 23 mg/dL — ABNORMAL HIGH (ref 6–20)
CO2: 25 mmol/L (ref 22–32)
Calcium: 9.2 mg/dL (ref 8.9–10.3)
Chloride: 101 mmol/L (ref 98–111)
Creatinine, Ser: 1.95 mg/dL — ABNORMAL HIGH (ref 0.61–1.24)
GFR calc Af Amer: 42 mL/min — ABNORMAL LOW (ref >60)
GFR calc non Af Amer: 36 mL/min — ABNORMAL LOW (ref >60)
Glucose, Bld: 155 mg/dL — ABNORMAL HIGH (ref 70–99)
Potassium: 4.9 mmol/L (ref 3.5–5.1)
Sodium: 134 mmol/L — ABNORMAL LOW (ref 135–145)

## 2019-03-31 LAB — GLUCOSE, CAPILLARY
Glucose-Capillary: 146 mg/dL — ABNORMAL HIGH (ref 70–99)
Glucose-Capillary: 175 mg/dL — ABNORMAL HIGH (ref 70–99)

## 2019-03-31 NOTE — Discharge Summary (Signed)
Date of admission: 03/30/2019  Date of discharge: 03/31/2019  Admission diagnosis: Left renal mass  Discharge diagnosis: same  Secondary diagnoses: anxiety, DM, HTN, hypercholesterolemia, CVA, MI  History and Physical: For full details, please see admission history and physical. Briefly, Raymond Barrera. is a 58 y.o. year old patient with a 4 cm solid and enhancing left renal mass on CT from 07/10/18 during a workup for an abdominal pain following transabdominal spinal fusion this July with Dr. Phylliss Bob.   He was seen at [facility] ED on 10/24/2018 for left-sided flank pain and was found to have a 4.3 cm solid and enhancing left renal mass concerning for RCC on CT.  His pain has since improved. He also notes improvement in his back pain and is much more ambulatory now. He is voiding without difficulty denies dysuria or hematuria.   Hospital Course: Pt was admitted and taken to the OR on 03/30/19 for left robotic assisted laparoscopic nephrectomy.  Pt tolerated the procedure well and was hemodynamically stable immediately post op.  He was extubated without complication and woke up from anesthesia neurologically intact. His post op course has progressed as expected.  He has been able to ambulate and void without issue after foley removal on POD 1.  His diet was advanced as tolerated to regular also without issue.  His pain is well controlled on PO pain medications.  He was passing flatus but had not had a BM on POD 1. Pt is doing well and felt stable for d/c home.   While discussing discharge instructions with the pt he did state that he has "tight, cramping discomfort" in his bilateral calves when ambulating.  He states this has been present for "a while" and is not new.  The discomfort resolves when he rests after ambulation. He denies numbness, tingling, pain, and wounds in his lower extremities.   Laboratory values:  Recent Labs    03/30/19 0720 03/30/19 1154 03/31/19 0500  HGB  12.5* 12.3* 12.6*  HCT 37.9* 38.3* 39.2   Recent Labs    03/31/19 0500  CREATININE 1.95*    Disposition: Home  Discharge instruction: The patient was instructed to be ambulatory but told to refrain from heavy lifting, strenuous activity, or driving.  *Advised the pt to ask his PCP or cardiologist to arrange ABIs of his bilateral lower extremities to evaluate his claudication.    Discharge medications:  . Allergies as of 03/31/2019      Reactions   Poison Ivy Extract [poison Ivy Extract] Itching, Rash      Medication List    STOP taking these medications   ADULT GUMMY PO   aspirin EC 81 MG tablet   meloxicam 15 MG tablet Commonly known as:  MOBIC   VIACTIV CALCIUM PLUS D PO     TAKE these medications   amLODipine 10 MG tablet Commonly known as:  NORVASC Take 10 mg by mouth daily.   diazepam 5 MG tablet Commonly known as:  Valium 1 po tid for spasm pain   docusate sodium 100 MG capsule Commonly known as:  COLACE Take 1 capsule (100 mg total) by mouth 2 (two) times daily.   gabapentin 300 MG capsule Commonly known as:  NEURONTIN Take 300 mg by mouth 3 (three) times daily.   glucose 4 GM chewable tablet Chew 1 tablet by mouth as needed for low blood sugar.   labetalol 200 MG tablet Commonly known as:  NORMODYNE Take 200 mg by mouth  2 (two) times daily.   metFORMIN 500 MG tablet Commonly known as:  GLUCOPHAGE Take 500 mg by mouth 2 (two) times daily with a meal.   ondansetron 4 MG tablet Commonly known as:  Zofran Take 1 tablet (4 mg total) by mouth every 8 (eight) hours as needed for nausea or vomiting.   Oxycodone HCl 10 MG Tabs Take 1 tablet (10 mg total) by mouth 3 (three) times daily as needed (pain.). What changed:  when to take this   sacubitril-valsartan 24-26 MG Commonly known as:  Entresto Take 1 tablet by mouth 2 (two) times daily.   simvastatin 40 MG tablet Commonly known as:  ZOCOR Take 40 mg by mouth at bedtime.   tiZANidine 4 MG  tablet Commonly known as:  ZANAFLEX Take 2-4 mg by mouth 2 (two) times daily as needed for muscle spasms.        Followup:  Follow-up Information    Ceasar Mons, MD On 04/18/2019.   Specialty:  Urology Why:  Postop appointment at 10:45 AM Contact information: [address] [place] [address] [phone]

## 2019-03-31 NOTE — Progress Notes (Signed)
1 Day Post-Op Subjective: NAEO.  Pain controlled.  Denies n/v.  Passing flatus.  H/H stable.  UOP appropriate--expected drop in renal function  Objective: Vital signs in last 24 hours: Temp:  [97.3 F (36.3 C)-99.8 F (37.7 C)] 99.2 F (37.3 C) (04/30 0458) Pulse Rate:  [74-99] 92 (04/30 0920) Resp:  [14-19] 19 (04/30 0920) BP: (137-160)/(87-100) 137/100 (04/30 0458) SpO2:  [95 %-100 %] 100 % (04/30 0920)  Intake/Output from previous day: 04/29 0701 - 04/30 0700 In: 3795.3 [P.O.:870; I.V.:2725.3; IV Piggyback:200] Out: 2950 [Urine:2850; Blood:100]  Intake/Output this shift: Total I/O In: -  Out: 1500 [Urine:1500]  Physical Exam:  General: Alert and oriented CV: RRR, palpable distal pulses Lungs: CTAB, equal chest rise Abdomen: Soft, NTND, no rebound or guarding Incisions: c/d/i Gu: Foley draining clear-yellow urine Ext: NT, No erythema  Lab Results: Recent Labs    03/30/19 0720 03/30/19 1154 03/31/19 0500  HGB 12.5* 12.3* 12.6*  HCT 37.9* 38.3* 39.2   BMET Recent Labs    03/31/19 0500  NA 134*  K 4.9  CL 101  CO2 25  GLUCOSE 155*  BUN 23*  CREATININE 1.95*  CALCIUM 9.2     Studies/Results: Dg Chest Port 1 View  Result Date: 03/30/2019 CLINICAL DATA:  Central line placement EXAM: PORTABLE CHEST 1 VIEW COMPARISON:  Portable exam 1231 hours compared to 01/14/2019 FINDINGS: RIGHT jugular central venous catheter with tip projecting over SVC. Enlargement of cardiac silhouette with pulmonary vascular congestion. Question minimal perihilar edema asymmetrically greater on RIGHT. Subsegmental atelectasis LEFT base. No pleural effusion or pneumothorax. IMPRESSION: No pneumothorax following RIGHT jugular line placement. Cardiac enlargement with LEFT basilar atelectasis and questionable mild pulmonary edema. Electronically Signed   By: Lavonia Dana M.D.   On: 03/30/2019 12:48    Assessment/Plan: POD 1 s/p robotic left radical nephrectomy  -OOBTC and  ambulate -ADAT -d/c Foley -Possibly home later today   LOS: 0 days   Ellison Hughs, MD [facility] Pager: [phone]  03/31/2019, 12:48 PM

## 2019-03-31 NOTE — Discharge Instructions (Signed)
1.  Activity:  You are encouraged to ambulate frequently (about every hour during waking hours) to help prevent blood clots from forming in your legs or lungs.  However, you should not engage in any heavy lifting (no more than 10-15 lbs), strenuous activity, or straining for 6 weeks after your surgery. 2. Diet: You should advance your diet as instructed by your physician.  It will be normal to have some bloating, nausea, and abdominal discomfort intermittently. 3. Prescriptions:  You will be provided a prescription for pain medication to take as needed.  If your pain is not severe enough to require the prescription pain medication, you may take extra strength Tylenol instead which will have less side effects.  You should also take a prescribed stool softener to avoid straining with bowel movements as the prescription pain medication may constipate you.  You should also have a laxative to utilize if you have not had a bowel movement 1-2 days after surgery. 4. Incisions: You may remove your dressing bandages 48 hours after surgery if not removed in the hospital.  You will either have some small staples or special tissue glue at each of the incision sites. Once the bandages are removed (if present), the incisions may stay open to air.  You may start showering (but not soaking or bathing in water) the 2nd day after surgery and the incisions simply need to be patted dry after the shower.  No additional care is needed. 5. What to call us about: You should call the office [phone]) if you develop fever > 101 or develop persistent vomiting.  Ask you primary care doctor or cardiologist to arrange the test of your legs to evaluate the blood flow.  The test is called an ABI.   You may resume aspirin, advil, aleve, vitamins, and supplements 7 days after surgery.

## 2019-04-16 ENCOUNTER — Encounter (HOSPITAL_COMMUNITY): Payer: Self-pay | Admitting: *Deleted

## 2019-04-16 ENCOUNTER — Emergency Department (HOSPITAL_COMMUNITY)
Admission: EM | Admit: 2019-04-16 | Discharge: 2019-04-16 | Disposition: A | Discharge status: Home / Self Care | Payer: Medicaid Other | Attending: Emergency Medicine | Admitting: Emergency Medicine

## 2019-04-16 ENCOUNTER — Other Ambulatory Visit: Payer: Self-pay

## 2019-04-16 ENCOUNTER — Emergency Department (HOSPITAL_COMMUNITY): Payer: Medicaid Other

## 2019-04-16 DIAGNOSIS — F17210 Nicotine dependence, cigarettes, uncomplicated: Secondary | ICD-10-CM | POA: Insufficient documentation

## 2019-04-16 DIAGNOSIS — I252 Old myocardial infarction: Secondary | ICD-10-CM | POA: Diagnosis not present

## 2019-04-16 DIAGNOSIS — M79605 Pain in left leg: Secondary | ICD-10-CM | POA: Diagnosis not present

## 2019-04-16 DIAGNOSIS — E119 Type 2 diabetes mellitus without complications: Secondary | ICD-10-CM | POA: Insufficient documentation

## 2019-04-16 DIAGNOSIS — Z96643 Presence of artificial hip joint, bilateral: Secondary | ICD-10-CM | POA: Diagnosis not present

## 2019-04-16 DIAGNOSIS — I10 Essential (primary) hypertension: Secondary | ICD-10-CM | POA: Insufficient documentation

## 2019-04-16 DIAGNOSIS — Z79899 Other long term (current) drug therapy: Secondary | ICD-10-CM | POA: Diagnosis not present

## 2019-04-16 DIAGNOSIS — Z7984 Long term (current) use of oral hypoglycemic drugs: Secondary | ICD-10-CM | POA: Diagnosis not present

## 2019-04-16 DIAGNOSIS — Z8673 Personal history of transient ischemic attack (TIA), and cerebral infarction without residual deficits: Secondary | ICD-10-CM | POA: Diagnosis not present

## 2019-04-16 NOTE — ED Triage Notes (Signed)
Pt with left thigh pain and started 2 weeks ago, recent left hip replacement.  Spouse states bruise to thigh noted last night.

## 2019-04-16 NOTE — Discharge Instructions (Addendum)
You have an irritated vein in your left leg, but NO deep blood clot which is the type that we worry about.  Elevate leg, heating pad, Tylenol or ibuprofen for pain.  The discoloration in your leg will go away over several weeks.

## 2019-04-16 NOTE — ED Notes (Signed)
Ultrasound Tech in room doing portable ultrasound now.

## 2019-04-17 NOTE — ED Provider Notes (Signed)
[facility] EMERGENCY DEPARTMENT Provider Note   CSN: [id number] Arrival date & time: 04/16/19  1107    History   Chief Complaint Chief Complaint  Patient presents with   Leg Pain    HPI Raymond Dault. is a 58 y.o. male.     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  Patient is now concerned about a black and blue area in the distal posterior left thigh.  Questionable minimal thigh tenderness.  No chest pain or dyspnea.  Severity of symptoms mild.  Nothing makes symptoms better or worse.     Past Medical History:  Diagnosis Date   Anxiety    due to the stroke   Arthritis    Back pain    hx of buldging disc   Diabetes mellitus without complication (HCC)    takes Metformin daily   High cholesterol    takes Zocor daily   Hypertension    takes Benazepril and HCTZ  daily   Joint pain    Joint swelling    Memory impairment    occassional - from stroke   Myocardial infarction ([place]) 1987   Pneumonia    hx of-80's   PONV (postoperative nausea and vomiting)    took a while for him to wake up after previous anesthesia   Shortness of breath dyspnea    do to pain   Sleep apnea    never had a sleep study,but states Dr. Cindie Laroche says he has it   Slurred speech    Stroke ([place]) 08/2013   7 mini-strokes, last stroke 2017   TIA (transient ischemic attack) 2014   x 7    Urinary frequency     Patient Active Problem List   Diagnosis Date Noted   Renal mass 03/30/2019   Abdominal hematoma 08/13/2018   Radiculopathy 06/16/2018   Acute CVA (cerebrovascular accident) ([place]) 02/01/2017   Diabetes mellitus ([place]) 02/01/2017   HTN (hypertension) 02/01/2017   Primary osteoarthritis of left hip 01/08/2016   DJD (degenerative joint disease) 03/20/2015   Primary osteoarthritis of right hip 02/26/2015   Lumbar spondylosis 10/17/2014   Lumbago 07/25/2014   Abnormality of gait 07/25/2014   Difficulty in walking(719.7) 07/25/2014    TIA (transient ischemic attack) 03/09/2013   Cocaine abuse ([place]) 03/09/2013   Accelerated hypertension 03/09/2013   Hyperlipidemia 03/09/2013   Current smoker 03/09/2013    Past Surgical History:  Procedure Laterality Date   ABDOMINAL EXPOSURE N/A 06/16/2018   Procedure: ABDOMINAL EXPOSURE;  Surgeon: Rosetta Posner, MD;  Location: [place];  Service: Vascular;  Laterality: N/A;   ANKLE SURGERY  2008   left ankle-otif-[facility]   ANTERIOR LUMBAR FUSION Bilateral 06/16/2018   Procedure: LUMBAR 4-5 LUMBAR 5-SACRUM 1 ANTERIOR LUMBAR INTERBODY FUSION WITH INSTRUMENTATION AND ALLOGRAFT;  Surgeon: Phylliss Bob, MD;  Location: [facility];  Service: Orthopedics;  Laterality: Bilateral;   BACK SURGERY     HEMATOMA EVACUATION Left 08/13/2018   Procedure: EVACUATION HEMATOMA LEFT ABDOMINAL WALL;  Surgeon: Rosetta Posner, MD;  Location: [facility];  Service: Vascular;  Laterality: Left;   IR RADIOLOGIST EVAL & MGMT  07/27/2018   IR US GUIDE BX ASP/DRAIN  07/14/2018   JOINT REPLACEMENT     both hips replaced    LUMBAR LAMINECTOMY/DECOMPRESSION MICRODISCECTOMY Right 10/17/2014   Procedure: LUMBAR LAMINECTOMY/DECOMPRESSION MICRODISCECTOMY 2 LEVELS;  Surgeon: [name], MD;  Location: [facility];  Service: Neurosurgery;  Laterality: Right;  Right L45 L5S1 laminectomy and foraminotomy   MASS EXCISION  09/13/2012   Procedure: EXCISION MASS;  Surgeon: Jamesetta So, MD;  Location: [facility];  Service: General;  Laterality: N/A;   ROBOTIC ASSITED PARTIAL NEPHRECTOMY Left 03/30/2019   Procedure: XI ROBOTIC ASSITED PARTIAL NEPHRECTOMY POSSIBLE RADICAL NEPHRECTOMY;  Surgeon: Ceasar Mons, MD;  Location: [facility];  Service: Urology;  Laterality: Left;   TONSILLECTOMY     TOTAL HIP ARTHROPLASTY Right 03/20/2015   TOTAL HIP ARTHROPLASTY Right 03/20/2015   Procedure: TOTAL HIP ARTHROPLASTY ANTERIOR APPROACH;  Surgeon: Renette Butters, MD;  Location: [place];  Service: Orthopedics;  Laterality: Right;     TOTAL HIP ARTHROPLASTY Left 01/08/2016   Procedure: TOTAL HIP ARTHROPLASTY ANTERIOR APPROACH;  Surgeon: Renette Butters, MD;  Location: [place];  Service: Orthopedics;  Laterality: Left;        Home Medications    Prior to Admission medications   Medication Sig Start Date End Date Taking? Authorizing Provider  amLODipine (NORVASC) 10 MG tablet Take 10 mg by mouth daily.   Yes [provider]  docusate sodium (COLACE) 100 MG capsule Take 1 capsule (100 mg total) by mouth 2 (two) times daily. 03/30/19  Yes Dancy, Amanda, PA-C  gabapentin (NEURONTIN) 300 MG capsule Take 300 mg by mouth 3 (three) times daily.   Yes [provider]  glucose 4 GM chewable tablet Chew 1 tablet by mouth as needed for low blood sugar.   Yes [provider]  labetalol (NORMODYNE) 200 MG tablet Take 200 mg by mouth 2 (two) times daily.   Yes [provider]  metFORMIN (GLUCOPHAGE) 500 MG tablet Take 500 mg by mouth 2 (two) times daily with a meal.   Yes [provider]  ondansetron (ZOFRAN) 4 MG tablet Take 1 tablet (4 mg total) by mouth every 8 (eight) hours as needed for nausea or vomiting. 03/30/19  Yes Dancy, Estill Bamberg, PA-C  Oxycodone HCl 10 MG TABS Take 1 tablet (10 mg total) by mouth 3 (three) times daily as needed (pain.). 03/30/19  Yes Dancy, Estill Bamberg, PA-C  sacubitril-valsartan (ENTRESTO) 24-26 MG Take 1 tablet by mouth 2 (two) times daily. 03/10/19  Yes Croitoru, Mihai, MD  simvastatin (ZOCOR) 40 MG tablet Take 40 mg by mouth at bedtime.    Yes [provider]  tiZANidine (ZANAFLEX) 4 MG tablet Take 2-4 mg by mouth 2 (two) times daily as needed for muscle spasms.  02/22/19  Yes [provider]  diazepam (VALIUM) 5 MG tablet 1 po tid for spasm pain Patient not taking: Reported on 03/08/2019 10/15/18   Lily Kocher, PA-C    Family History Family History  Problem Relation Age of Onset   Heart disease Father     Social History Social History    Tobacco Use   Smoking status: Current Some Day Smoker    Packs/day: 0.25    Years: 47.00    Pack years: 11.75    Types: Cigarettes   Smokeless tobacco: Never Used   Tobacco comment: 2 cigarettes  Substance Use Topics   Alcohol use: No    Comment: quit 2012   Drug use: Not Currently    Types: Cocaine    Comment: many yrs ago., last time- late 2016     Allergies   Poison ivy extract [poison ivy extract]   Review of Systems Review of Systems  All other systems reviewed and are negative.    Physical Exam Updated Vital Signs BP (!) 150/100 (BP Location: Right Arm)    Pulse 88  Temp 97.8 F (36.6 C) (Oral)    Resp 16    Ht 6\' 6"  (1.981 m)    Wt 117.9 kg    SpO2 98%    BMI 30.05 kg/m   Physical Exam Vitals signs and nursing note reviewed.  Constitutional:      Appearance: He is well-developed.  HENT:     Head: Normocephalic and atraumatic.  Eyes:     Conjunctiva/sclera: Conjunctivae normal.  Neck:     Musculoskeletal: Neck supple.  Cardiovascular:     Rate and Rhythm: Normal rate and regular rhythm.  Pulmonary:     Effort: Pulmonary effort is normal.     Breath sounds: Normal breath sounds.  Abdominal:     General: Bowel sounds are normal.     Palpations: Abdomen is soft.  Musculoskeletal:     Comments: Left thigh: Area of ecchymosis approximately 6 cm in diameter in the distal posterior aspect.  Minimal posterior thigh tenderness.  Skin:    General: Skin is warm and dry.  Neurological:     Mental Status: He is alert and oriented to person, place, and time.  Psychiatric:        Behavior: Behavior normal.      ED Treatments / Results  Labs (all labs ordered are listed, but only abnormal results are displayed) Labs Reviewed - No data to display  EKG None  Radiology US Venous Img Lower Unilateral Left  Result Date: 04/16/2019 CLINICAL DATA:  Left lower extremity pain and edema for the past 2 weeks. History of left hip replacement. EXAM: LEFT  LOWER EXTREMITY VENOUS DOPPLER ULTRASOUND TECHNIQUE: Gray-scale sonography with graded compression, as well as color Doppler and duplex ultrasound were performed to evaluate the lower extremity deep venous systems from the level of the common femoral vein and including the common femoral, femoral, profunda femoral, popliteal and calf veins including the posterior tibial, peroneal and gastrocnemius veins when visible. The superficial great saphenous vein was also interrogated. Spectral Doppler was utilized to evaluate flow at rest and with distal augmentation maneuvers in the common femoral, femoral and popliteal veins. COMPARISON:  None. FINDINGS: Contralateral Common Femoral Vein: Respiratory phasicity is normal and symmetric with the symptomatic side. No evidence of thrombus. Normal compressibility. Common Femoral Vein: No evidence of thrombus. Normal compressibility, respiratory phasicity and response to augmentation. Saphenofemoral Junction: No evidence of thrombus. Normal compressibility and flow on color Doppler imaging. Profunda Femoral Vein: No evidence of thrombus. Normal compressibility and flow on color Doppler imaging. Femoral Vein: No evidence of thrombus. Normal compressibility, respiratory phasicity and response to augmentation. Popliteal Vein: No evidence of thrombus. Normal compressibility, respiratory phasicity and response to augmentation. Calf Veins: No evidence of thrombus. Normal compressibility and flow on color Doppler imaging. Superficial Great Saphenous Vein: No evidence of thrombus. Normal compressibility. Venous Reflux:  None. Other Findings: There is mixed echogenic occlusive thrombus within a hypertrophied varicosity within the left popliteal fossa (images 31 through 37). A portion of the thrombus appears echogenic potentially indicative of calcification and thus a chronic etiology. IMPRESSION: 1. No evidence of DVT within the left lower extremity. 2. Examination is positive for  occlusive superficial thrombophlebitis involving a prominent varicosity within the left popliteal fossa. While age-indeterminate, a portion of the thrombus appears echogenic, indicative of calcification, and thus favoring a chronic etiology. Clinical correlation is advised. Electronically Signed   By: Sandi Mariscal M.D.   On: 04/16/2019 12:26    Procedures Procedures (including critical care time)  Medications Ordered in  ED Medications - No data to display   Initial Impression / Assessment and Plan / ED Course  I have reviewed the triage vital signs and the nursing notes.  Pertinent labs & imaging results that were available during my care of the patient were reviewed by me and considered in my medical decision making (see chart for details).        Patient presents with left thigh tenderness and ecchymosis.  Doppler study reveals a superficial thrombophlebitis, but no DVT.  These findings were discussed with the patient.  Final Clinical Impressions(s) / ED Diagnoses   Final diagnoses:  Left leg pain    ED Discharge Orders    None       Nat Christen, MD 04/17/19 [phone]

## 2019-04-18 ENCOUNTER — Other Ambulatory Visit: Payer: Self-pay

## 2019-04-18 ENCOUNTER — Ambulatory Visit (HOSPITAL_COMMUNITY): Admit: 2019-04-18 | Payer: Medicaid Other | Admitting: Interventional Cardiology

## 2019-04-18 ENCOUNTER — Telehealth: Payer: Self-pay | Admitting: Cardiovascular Disease

## 2019-04-18 ENCOUNTER — Emergency Department (HOSPITAL_COMMUNITY): Payer: Medicaid Other

## 2019-04-18 ENCOUNTER — Encounter (HOSPITAL_COMMUNITY): Payer: Self-pay

## 2019-04-18 ENCOUNTER — Inpatient Hospital Stay (HOSPITAL_COMMUNITY)
Admission: EM | Admit: 2019-04-18 | Discharge: 2019-04-20 | DRG: 292 | Disposition: A | Discharge status: Home / Self Care | Payer: Medicaid Other | Attending: Family Medicine | Admitting: Family Medicine

## 2019-04-18 DIAGNOSIS — M25552 Pain in left hip: Secondary | ICD-10-CM

## 2019-04-18 DIAGNOSIS — M25551 Pain in right hip: Secondary | ICD-10-CM

## 2019-04-18 DIAGNOSIS — R569 Unspecified convulsions: Secondary | ICD-10-CM | POA: Diagnosis present

## 2019-04-18 DIAGNOSIS — R55 Syncope and collapse: Secondary | ICD-10-CM | POA: Diagnosis present

## 2019-04-18 DIAGNOSIS — G4730 Sleep apnea, unspecified: Secondary | ICD-10-CM | POA: Diagnosis present

## 2019-04-18 DIAGNOSIS — I69351 Hemiplegia and hemiparesis following cerebral infarction affecting right dominant side: Secondary | ICD-10-CM

## 2019-04-18 DIAGNOSIS — R61 Generalized hyperhidrosis: Secondary | ICD-10-CM | POA: Diagnosis present

## 2019-04-18 DIAGNOSIS — I5023 Acute on chronic systolic (congestive) heart failure: Secondary | ICD-10-CM | POA: Diagnosis present

## 2019-04-18 DIAGNOSIS — Z981 Arthrodesis status: Secondary | ICD-10-CM

## 2019-04-18 DIAGNOSIS — Z7984 Long term (current) use of oral hypoglycemic drugs: Secondary | ICD-10-CM

## 2019-04-18 DIAGNOSIS — R112 Nausea with vomiting, unspecified: Secondary | ICD-10-CM | POA: Diagnosis present

## 2019-04-18 DIAGNOSIS — E1143 Type 2 diabetes mellitus with diabetic autonomic (poly)neuropathy: Secondary | ICD-10-CM | POA: Diagnosis present

## 2019-04-18 DIAGNOSIS — Z9289 Personal history of other medical treatment: Secondary | ICD-10-CM

## 2019-04-18 DIAGNOSIS — I5022 Chronic systolic (congestive) heart failure: Secondary | ICD-10-CM | POA: Diagnosis not present

## 2019-04-18 DIAGNOSIS — F419 Anxiety disorder, unspecified: Secondary | ICD-10-CM | POA: Diagnosis present

## 2019-04-18 DIAGNOSIS — I083 Combined rheumatic disorders of mitral, aortic and tricuspid valves: Secondary | ICD-10-CM | POA: Diagnosis present

## 2019-04-18 DIAGNOSIS — E861 Hypovolemia: Secondary | ICD-10-CM | POA: Diagnosis present

## 2019-04-18 DIAGNOSIS — Z7982 Long term (current) use of aspirin: Secondary | ICD-10-CM

## 2019-04-18 DIAGNOSIS — K3184 Gastroparesis: Secondary | ICD-10-CM | POA: Diagnosis present

## 2019-04-18 DIAGNOSIS — Z85528 Personal history of other malignant neoplasm of kidney: Secondary | ICD-10-CM | POA: Diagnosis not present

## 2019-04-18 DIAGNOSIS — I69392 Facial weakness following cerebral infarction: Secondary | ICD-10-CM

## 2019-04-18 DIAGNOSIS — I428 Other cardiomyopathies: Secondary | ICD-10-CM | POA: Diagnosis present

## 2019-04-18 DIAGNOSIS — D649 Anemia, unspecified: Secondary | ICD-10-CM | POA: Diagnosis present

## 2019-04-18 DIAGNOSIS — Z9109 Other allergy status, other than to drugs and biological substances: Secondary | ICD-10-CM

## 2019-04-18 DIAGNOSIS — D72829 Elevated white blood cell count, unspecified: Secondary | ICD-10-CM | POA: Diagnosis present

## 2019-04-18 DIAGNOSIS — E1151 Type 2 diabetes mellitus with diabetic peripheral angiopathy without gangrene: Secondary | ICD-10-CM | POA: Diagnosis present

## 2019-04-18 DIAGNOSIS — N179 Acute kidney failure, unspecified: Secondary | ICD-10-CM | POA: Diagnosis present

## 2019-04-18 DIAGNOSIS — J9811 Atelectasis: Secondary | ICD-10-CM | POA: Diagnosis present

## 2019-04-18 DIAGNOSIS — I959 Hypotension, unspecified: Secondary | ICD-10-CM | POA: Diagnosis present

## 2019-04-18 DIAGNOSIS — I440 Atrioventricular block, first degree: Secondary | ICD-10-CM | POA: Diagnosis present

## 2019-04-18 DIAGNOSIS — H518 Other specified disorders of binocular movement: Secondary | ICD-10-CM | POA: Diagnosis present

## 2019-04-18 DIAGNOSIS — F1421 Cocaine dependence, in remission: Secondary | ICD-10-CM | POA: Diagnosis present

## 2019-04-18 DIAGNOSIS — Z1159 Encounter for screening for other viral diseases: Secondary | ICD-10-CM

## 2019-04-18 DIAGNOSIS — Z79899 Other long term (current) drug therapy: Secondary | ICD-10-CM

## 2019-04-18 DIAGNOSIS — Z905 Acquired absence of kidney: Secondary | ICD-10-CM

## 2019-04-18 DIAGNOSIS — F17210 Nicotine dependence, cigarettes, uncomplicated: Secondary | ICD-10-CM | POA: Diagnosis present

## 2019-04-18 DIAGNOSIS — E785 Hyperlipidemia, unspecified: Secondary | ICD-10-CM | POA: Diagnosis present

## 2019-04-18 DIAGNOSIS — I252 Old myocardial infarction: Secondary | ICD-10-CM

## 2019-04-18 DIAGNOSIS — Z96643 Presence of artificial hip joint, bilateral: Secondary | ICD-10-CM | POA: Diagnosis present

## 2019-04-18 DIAGNOSIS — E7800 Pure hypercholesterolemia, unspecified: Secondary | ICD-10-CM | POA: Diagnosis present

## 2019-04-18 DIAGNOSIS — Z8249 Family history of ischemic heart disease and other diseases of the circulatory system: Secondary | ICD-10-CM

## 2019-04-18 DIAGNOSIS — R6881 Early satiety: Secondary | ICD-10-CM | POA: Diagnosis present

## 2019-04-18 DIAGNOSIS — I110 Hypertensive heart disease with heart failure: Principal | ICD-10-CM | POA: Diagnosis present

## 2019-04-18 DIAGNOSIS — R350 Frequency of micturition: Secondary | ICD-10-CM | POA: Diagnosis present

## 2019-04-18 LAB — LIPID PANEL
Cholesterol: 228 mg/dL — ABNORMAL HIGH (ref 0–200)
HDL: 34 mg/dL — ABNORMAL LOW (ref >40)
LDL Cholesterol: 159 mg/dL — ABNORMAL HIGH (ref 0–99)
Total CHOL/HDL Ratio: 6.7 RATIO
Triglycerides: 176 mg/dL — ABNORMAL HIGH (ref <150)
VLDL: 35 mg/dL (ref 0–40)

## 2019-04-18 LAB — CBC WITH DIFFERENTIAL/PLATELET
Abs Immature Granulocytes: 0.05 10*3/uL (ref 0.00–0.07)
Basophils Absolute: 0 10*3/uL (ref 0.0–0.1)
Basophils Relative: 0 %
Eosinophils Absolute: 0.1 10*3/uL (ref 0.0–0.5)
Eosinophils Relative: 1 %
HCT: 37.5 % — ABNORMAL LOW (ref 39.0–52.0)
Hemoglobin: 11.9 g/dL — ABNORMAL LOW (ref 13.0–17.0)
Immature Granulocytes: 0 %
Lymphocytes Relative: 8 %
Lymphs Abs: 1 10*3/uL (ref 0.7–4.0)
MCH: 29 pg (ref 26.0–34.0)
MCHC: 31.7 g/dL (ref 30.0–36.0)
MCV: 91.2 fL (ref 80.0–100.0)
Monocytes Absolute: 0.5 10*3/uL (ref 0.1–1.0)
Monocytes Relative: 4 %
Neutro Abs: 10.1 10*3/uL — ABNORMAL HIGH (ref 1.7–7.7)
Neutrophils Relative %: 87 %
Platelets: 316 10*3/uL (ref 150–400)
RBC: 4.11 MIL/uL — ABNORMAL LOW (ref 4.22–5.81)
RDW: 13.7 % (ref 11.5–15.5)
WBC: 11.7 10*3/uL — ABNORMAL HIGH (ref 4.0–10.5)
nRBC: 0 % (ref 0.0–0.2)

## 2019-04-18 LAB — URINALYSIS, ROUTINE W REFLEX MICROSCOPIC
Bilirubin Urine: NEGATIVE
Glucose, UA: NEGATIVE mg/dL
Hgb urine dipstick: NEGATIVE
Ketones, ur: NEGATIVE mg/dL
Leukocytes,Ua: NEGATIVE
Nitrite: NEGATIVE
Protein, ur: 30 mg/dL — AB
Specific Gravity, Urine: 1.023 (ref 1.005–1.030)
pH: 5 (ref 5.0–8.0)

## 2019-04-18 LAB — SARS CORONAVIRUS 2 BY RT PCR (HOSPITAL ORDER, PERFORMED IN ~~LOC~~ HOSPITAL LAB): SARS Coronavirus 2: NEGATIVE

## 2019-04-18 LAB — COMPREHENSIVE METABOLIC PANEL
ALT: 21 U/L (ref 0–44)
AST: 17 U/L (ref 15–41)
Albumin: 3.7 g/dL (ref 3.5–5.0)
Alkaline Phosphatase: 101 U/L (ref 38–126)
Anion gap: 12 (ref 5–15)
BUN: 37 mg/dL — ABNORMAL HIGH (ref 6–20)
CO2: 23 mmol/L (ref 22–32)
Calcium: 9.5 mg/dL (ref 8.9–10.3)
Chloride: 105 mmol/L (ref 98–111)
Creatinine, Ser: 2.54 mg/dL — ABNORMAL HIGH (ref 0.61–1.24)
GFR calc Af Amer: 31 mL/min — ABNORMAL LOW (ref >60)
GFR calc non Af Amer: 26 mL/min — ABNORMAL LOW (ref >60)
Glucose, Bld: 159 mg/dL — ABNORMAL HIGH (ref 70–99)
Potassium: 4.8 mmol/L (ref 3.5–5.1)
Sodium: 140 mmol/L (ref 135–145)
Total Bilirubin: 0.7 mg/dL (ref 0.3–1.2)
Total Protein: 7.8 g/dL (ref 6.5–8.1)

## 2019-04-18 LAB — CBG MONITORING, ED: Glucose-Capillary: 162 mg/dL — ABNORMAL HIGH (ref 70–99)

## 2019-04-18 LAB — I-STAT TROPONIN, ED: Troponin i, poc: 0 ng/mL (ref 0.00–0.08)

## 2019-04-18 LAB — RAPID URINE DRUG SCREEN, HOSP PERFORMED
Amphetamines: NOT DETECTED
Barbiturates: NOT DETECTED
Benzodiazepines: NOT DETECTED
Cocaine: NOT DETECTED
Opiates: NOT DETECTED
Tetrahydrocannabinol: NOT DETECTED

## 2019-04-18 LAB — ETHANOL: Alcohol, Ethyl (B): <10 mg/dL (ref <10)

## 2019-04-18 LAB — PROTIME-INR
INR: 1.1 (ref 0.8–1.2)
Prothrombin Time: 14.5 seconds (ref 11.4–15.2)

## 2019-04-18 LAB — TROPONIN I: Troponin I: <0.03 ng/mL (ref <0.03)

## 2019-04-18 LAB — BRAIN NATRIURETIC PEPTIDE: B Natriuretic Peptide: 6.9 pg/mL (ref 0.0–100.0)

## 2019-04-18 LAB — TSH: TSH: 0.432 u[IU]/mL (ref 0.350–4.500)

## 2019-04-18 SURGERY — LEFT HEART CATH AND CORONARY ANGIOGRAPHY
Anesthesia: LOCAL

## 2019-04-18 MED ORDER — ATORVASTATIN CALCIUM 10 MG PO TABS
20.0000 mg | ORAL_TABLET | Freq: Every day | ORAL | Status: DC
  Administered 2019-04-18: 20 mg via ORAL
  Filled 2019-04-18: qty 2

## 2019-04-18 MED ORDER — ACETAMINOPHEN 325 MG PO TABS: 650.0000 mg | ORAL_TABLET | Freq: Four times a day (QID) | ORAL | Status: DC | PRN

## 2019-04-18 MED ORDER — ACETAMINOPHEN 650 MG RE SUPP: 650.0000 mg | Freq: Four times a day (QID) | RECTAL | Status: DC | PRN

## 2019-04-18 MED ORDER — AMLODIPINE BESYLATE 5 MG PO TABS
10.0000 mg | ORAL_TABLET | Freq: Every day | ORAL | Status: DC
  Administered 2019-04-18 – 2019-04-19 (×2): 10 mg via ORAL
  Filled 2019-04-18 (×3): qty 4

## 2019-04-18 MED ORDER — INSULIN ASPART 100 UNIT/ML ~~LOC~~ SOLN
0.0000 [IU] | Freq: Three times a day (TID) | SUBCUTANEOUS | Status: DC
  Administered 2019-04-19: 1 [IU] via SUBCUTANEOUS
  Administered 2019-04-20: 2 [IU] via SUBCUTANEOUS

## 2019-04-18 MED ORDER — TIZANIDINE HCL 4 MG PO TABS: 2.0000 mg | ORAL_TABLET | Freq: Two times a day (BID) | ORAL | Status: DC | PRN

## 2019-04-18 MED ORDER — ONDANSETRON HCL 4 MG PO TABS: 4.0000 mg | ORAL_TABLET | Freq: Three times a day (TID) | ORAL | Status: DC | PRN

## 2019-04-18 MED ORDER — SACUBITRIL-VALSARTAN 24-26 MG PO TABS
1.0000 | ORAL_TABLET | Freq: Two times a day (BID) | ORAL | Status: DC
  Administered 2019-04-18 – 2019-04-20 (×4): 1 via ORAL
  Filled 2019-04-18 (×4): qty 1

## 2019-04-18 MED ORDER — DOCUSATE SODIUM 100 MG PO CAPS
100.0000 mg | ORAL_CAPSULE | Freq: Two times a day (BID) | ORAL | Status: DC
  Administered 2019-04-18 – 2019-04-20 (×4): 100 mg via ORAL
  Filled 2019-04-18 (×4): qty 1

## 2019-04-18 MED ORDER — LABETALOL HCL 200 MG PO TABS
200.0000 mg | ORAL_TABLET | Freq: Two times a day (BID) | ORAL | Status: DC
  Administered 2019-04-18 – 2019-04-20 (×4): 200 mg via ORAL
  Filled 2019-04-18 (×4): qty 1

## 2019-04-18 MED ORDER — ENOXAPARIN SODIUM 40 MG/0.4ML ~~LOC~~ SOLN
40.0000 mg | SUBCUTANEOUS | Status: DC
  Administered 2019-04-18 – 2019-04-19 (×2): 40 mg via SUBCUTANEOUS
  Filled 2019-04-18 (×2): qty 0.4

## 2019-04-18 MED ORDER — SIMVASTATIN 20 MG PO TABS: 40.0000 mg | ORAL_TABLET | Freq: Every day | ORAL | Status: DC

## 2019-04-18 MED ORDER — OXYCODONE HCL 10 MG PO TABS: 10.0000 mg | ORAL_TABLET | Freq: Three times a day (TID) | ORAL | Status: DC | PRN

## 2019-04-18 NOTE — ED Notes (Addendum)
Raymond Barrera, wife, [phone], please call with updates [phone] secondary number RN Navigator to speak with pt then contact wife

## 2019-04-18 NOTE — H&P (Addendum)
FMTS Attending Admission Note: Chrisandra Netters MD Personal pager:  [phone] FPTS Service Pager:  [phone]  I have seen and examined this patient, and reviewed their chart. I have discussed this patient with the resident. I agree with the resident's findings, assessment and care plan.  Additionally:  Late cosign, seen this AM. Briefly, 58 yo M presenting with syncope vs. seizure. Given history of chronic systolic CHF we have consulted cardiology - may need prolonged cardiac monitoring. Given history of recent dx of RCC, have ordered MRI brain to rule out intracranial process/metastatic disease. Appreciate cardiology recommendations.   Leeanne Rio, MD 04/19/2019      [facility] Admission History and Physical Service Pager: [phone]  Patient name: Raymond Barrera. Medical record number: [id number] Date of birth: 08-04-1961 Age: 58 y.o. Gender: male  Primary Care Provider: Lucia Gaskins, MD Consultants: None Code Status: Full code Emergency Contact: Shanon Ace [phone]  Chief Complaint: syncope  Assessment and Plan: [name]. is a 58 y.o. male presenting after two episodes of syncope. PMH is significant for s/p left nephrectomy, HTN, HLD, T2DM,  HFrEF EF 20-25%, acute CVA, DJD, osteoarthritis.   Syncope: acute, stable Presenting after two syncopal episodes involving diaphoresis, SOB, nausea, vomiting, and episodes of eye deviation and abnormal body movement. Most concerning etiology is new onset seizure given abnormal body movements with eye deviation and vomiting. Additionally, given history of recent RCC, some concern for metastasis to the brain. Negative troponin and normal EKG without ST changes is reassuring for ACS or arrhythmia, although either tachy or bradyarrhythmia is possible. Patient does have severe heart failure with EF 20-25% on a high dose of Labetalol, so an undiscovered heart block is possible. Patient's  symptoms could be result of polypharmacy as patient has narcotic, benzo, gabapentin, and muscle relaxer on medication list although he states he is no longer taking the benzo. On presentation, patient was hemodynamically stable on RA, normotensive with HR in 80-100's. BNP WNL and patient euvolemic on exam.  CT head without any acute findings, CXR negative, and lack of infectious symptoms are all reassuring. Could potentially be orthostatic hypotension given symptoms initially started when he stood up, however the severity of symptoms occurred after being seated for several minutes makes this less likely. Given diaphoresis could also be vasovagal reaction. He does have a history of polysubstance abuse however declines recent use. - admit to med-tele, FPTS, attending Dr. Ardelia Mems - continuous cardiac monitoring x 24hrs - EEG, Echo, repeat EKG in am - UDS, ethanol, TSH, A1C, lipid panel - continue Entresto, Labetalol, Norvasc - orthostatic VS - PT/OT eval and treat - possibly consult cardiology in AM - MRI head w/o contrast - up with assistance  Leukocytosis with Elevated ANC: Leukocytosis to 11.7 with ANC 10.1. Denies any infectious symptoms and no signs of infection on exam. Except secondary to acute process given WBC at [facility] ED was 9.5. - continue to monitor - daily CBC - if becomes febrile or develops infectious symptoms: obtain CXR, CBC, UA, urine and blood culture  AKI  S/p Left Nephrectomy: Creatinine on admission 2.54. 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 CT abdomen/pelvis w/out contrast negative for hematoma or seroma. This is likely patient's new baseline given recent procedure. Evaluated risk vs benefit for discontinuing Entresto given kidney function, but opted to continue given HF. Would expect creatinine rise to plateau at this time, if increased could consider nephrology consult. -  continue to monitor with daily BMP - avoid  nephrotoxic agents  Normocytic Anemia: Hgb 11.9 (baseline 12, although baseline 15 two years ago). MCV 91.2. 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 No signs of acute bleed. - continue to monitor - daily CBC - iron studies  HFrEF (EF: 20-25%): Echo 06/15/18 with EF 20-25%, severe diffuse hypokinesis but no regional wall motion abnormality, grade 1 diastolic dysfunction, and severely dilated left atrium. Has had nuclear stress test with similar findings but no reversible ischemia. Followed by Fabian Sharp, PA at [facility]. Home meds: Norvasc 10mg  QD, Labetalol 200mg  BID, Entresto 24-26mg  BID. Per last cardiology note, considering switching from Labetalol to Toprol vs Coreg at follow up visit in 3 months. Would likely benefit from coreg given increased utility in HFrEF. - Continue home meds - Echo - repeat EKG in am  HTN: BP on admission 115/75, increased to 137/110. Home mesd: Norvasc 10mg , Entrestro 24/26mg  BID, Labetalol 200mg  BID. - continue home meds - monitor vitals  HLD: Last lipid panel 2018: Chol 180, Trig 169, HDL 31, LDL 115. Home meds: simvastatin 40mg  qHS - continue home med - follow up lipid panel  T2DM with peripheral neuropathy: Last A1C: 7.1. Home meds: Metformin 500mg  BID, Gabapentin 300mg  TID. 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 Notes some residual right hand weakness. Neuro exam benign. Has baseline right facial droop that patient notes he has always had since a child. Appears patient was on aspirin, but is not currently taking it.   Total hip Replacement (~2017)  Chronic back pani s/p Lumbar Fusion: Home meds: Oxycodone 10mg  TID PRN, Tylenol 1000mg  q6 PRN, Tizanidine 2-4mg  PRN, and Gabapentin 300mg  TID - hold all home meds given syncopal episode of unknown etiology  FEN/GI: Heart healthy/carb modified Prophylaxis:  Lovenox  Disposition: pending workup  History of Present Illness:  Raymond Barrera. is a 58 y.o. male presenting after two episodes of syncope. Woke up this AM and took his meds, washed up and then sat at kitchen table to eat a banana. Stated he didn't feel well. Sat up and fell up against the door "almost passed up". He laid down on the floor without much improvement. Patient notes he felt dizzy and wife notes he was sweaty. Patient walked to transport van to go to appointment and patient got dizzy. Patient got into van to head to Ortho appointment [facility] orthopedics) for follow up visit for his bilateral hip arthroplasties. Patient notes he was nauseous, sweaty, SOB and wasn't feeling well. Per wife, patient was sweating really bad. ". She notes she gave him some "glucose jell" she had in her purse, but this did not improve. Per wife, patient's eyes went to the back of his head, one of his hands twisted around, and shaking "like he was having a seizure". He fell forward into wife's arms. He came back a little and noted he didn't feel good, then another episode of "eyes rolling, shaking, sweating". After this he also vomited. He did not improve until they got to the ED. She is unsure what they did once they got to the ED.   Per wife, left side of face and lip drooped during his shaking episode.  Denies any bladder or bowel incontinence. Denies fever or chills. Denies cough, congestion, sore throat. Denies any chest pain, SOB, palpitations, headaches, numbness/tingling. Denies  any family history of seizures. Denies any alcohol (>40 years). Has done cocaine >5 years ago. Smokes 1-2 cigarettes/day.  Per wife, he had one of these episodes once before after a spinal fusion. After his spinal fusion, he had a seroma in left abdomen which required drainage. She notes his blood sugar dropped and he had "grey coloring of skin, sweaty, eyes rolling in back of head, and shaking "to the point he looked like he  was having a seizure", confused. She notes they gave him glucose and he improved. He woke up confused, unsure of where he was. (See ED note from 07/14/18)  Of note, patient has had 7 TIA's and 1 "large stroke" which occurring March 2019.    At [facility] ED: T 36.6, BP 123/95, RR 23, HR 100, 95% RA. ABG pH 7.35, pCO2 43, pO2 67(L), WBC 9.5, Hgb 11.6, MCV 89.6. Due to concern for a MI, patient was transported to [facility] ED. However, cardiology looked over EKG (in addition to a negative troponin) and thus ruled-out STEMI/NSTEMI. Upon arrival to [facility] ED, patient was hemodynamically stable and afebrile. CXR with stable chronic left basilar atelectasis doubt acute findings.  Head CT w/out contrast Mild diffuse atrophy, Age advanced periventricular small vessel vascular disease, Prior small infarct at the genu of the left internal capsule and adjacent anterolateral thalamus, no mass, hemorrhage, or acute infarct. CT abdomen/pelvis Status post left nephrectomy. No postoperative hematoma or seroma is seen. EKG with sinus rhythm with prolonged PR interval, no ST changes. CMP with normal electrolytes, BUN 37, Cr 2.54, glucose 159, WBC 11.7 with ANC of 10.1, Hgb 11.9, MCV 91.2, Troponin 0.00, PT/INR 14.5/1.1, BNP 6.9. COVID negative. UA negative. Patient was admitted for further syncope workup.  Review Of Systems: Per HPI with the following additions:  Review of Systems  Constitutional: Positive for diaphoresis. Negative for chills and fever.  HENT: Positive for congestion and sore throat.   Respiratory: Positive for shortness of breath. Negative for cough and sputum production.   Cardiovascular: Negative for chest pain, palpitations and leg swelling.  Gastrointestinal: Positive for nausea and vomiting. Negative for abdominal pain, constipation and diarrhea.  Genitourinary: Negative for dysuria.  Musculoskeletal: Positive for back pain (chronic).  Neurological: Positive for dizziness, loss of  consciousness and weakness. Negative for headaches.    Patient Active Problem List   Diagnosis Date Noted  . Renal mass 03/30/2019  . Abdominal hematoma 08/13/2018  . Radiculopathy 06/16/2018  . Acute CVA (cerebrovascular accident) ([place]) 02/01/2017  . Diabetes mellitus ([place]) 02/01/2017  . HTN (hypertension) 02/01/2017  . Primary osteoarthritis of left hip 01/08/2016  . DJD (degenerative joint disease) 03/20/2015  . Primary osteoarthritis of right hip 02/26/2015  . Lumbar spondylosis 10/17/2014  . Lumbago 07/25/2014  . Abnormality of gait 07/25/2014  . Difficulty in walking(719.7) 07/25/2014  . TIA (transient ischemic attack) 03/09/2013  . Cocaine abuse ([place]) 03/09/2013  . Accelerated hypertension 03/09/2013  . Hyperlipidemia 03/09/2013  . Current smoker 03/09/2013    Past Medical History: Past Medical History:  Diagnosis Date  . Anxiety    due to the stroke  . Arthritis   . Back pain    hx of buldging disc  . Diabetes mellitus without complication ([place])    takes Metformin daily  . High cholesterol    takes Zocor daily  . Hypertension    takes Benazepril and HCTZ  daily  . Joint pain   . Joint swelling   . Memory impairment  occassional - from stroke  . Myocardial infarction ([place]) 1987  . Pneumonia    hx of-80's  . PONV (postoperative nausea and vomiting)    took a while for him to wake up after previous anesthesia  . Shortness of breath dyspnea    do to pain  . Sleep apnea    never had a sleep study,but states Dr. Cindie Laroche says he has it  . Slurred speech   . Stroke ([place]) 08/2013   7 mini-strokes, last stroke 2017  . TIA (transient ischemic attack) 2014   x 7   . Urinary frequency     Past Surgical History: Past Surgical History:  Procedure Laterality Date  . ABDOMINAL EXPOSURE N/A 06/16/2018   Procedure: ABDOMINAL EXPOSURE;  Surgeon: Rosetta Posner, MD;  Location: [place];  Service: Vascular;  Laterality: N/A;  . ANKLE SURGERY  2008   left  ankle-otif-[facility]  . ANTERIOR LUMBAR FUSION Bilateral 06/16/2018   Procedure: LUMBAR 4-5 LUMBAR 5-SACRUM 1 ANTERIOR LUMBAR INTERBODY FUSION WITH INSTRUMENTATION AND ALLOGRAFT;  Surgeon: Phylliss Bob, MD;  Location: [place];  Service: Orthopedics;  Laterality: Bilateral;  . BACK SURGERY    . HEMATOMA EVACUATION Left 08/13/2018   Procedure: EVACUATION HEMATOMA LEFT ABDOMINAL WALL;  Surgeon: Rosetta Posner, MD;  Location: [facility] OR;  Service: Vascular;  Laterality: Left;  . IR RADIOLOGIST EVAL & MGMT  07/27/2018  . IR US GUIDE BX ASP/DRAIN  07/14/2018  . JOINT REPLACEMENT     both hips replaced   . LUMBAR LAMINECTOMY/DECOMPRESSION MICRODISCECTOMY Right 10/17/2014   Procedure: LUMBAR LAMINECTOMY/DECOMPRESSION MICRODISCECTOMY 2 LEVELS;  Surgeon: [name], MD;  Location: [place] NEURO ORS;  Service: Neurosurgery;  Laterality: Right;  Right L45 L5S1 laminectomy and foraminotomy  . MASS EXCISION  09/13/2012   Procedure: EXCISION MASS;  Surgeon: Jamesetta So, MD;  Location: [facility];  Service: General;  Laterality: N/A;  . ROBOTIC ASSITED PARTIAL NEPHRECTOMY Left 03/30/2019   Procedure: XI ROBOTIC ASSITED PARTIAL NEPHRECTOMY POSSIBLE RADICAL NEPHRECTOMY;  Surgeon: Ceasar Mons, MD;  Location: [facility];  Service: Urology;  Laterality: Left;  . TONSILLECTOMY    . TOTAL HIP ARTHROPLASTY Right 03/20/2015  . TOTAL HIP ARTHROPLASTY Right 03/20/2015   Procedure: TOTAL HIP ARTHROPLASTY ANTERIOR APPROACH;  Surgeon: Renette Butters, MD;  Location: [place];  Service: Orthopedics;  Laterality: Right;  . TOTAL HIP ARTHROPLASTY Left 01/08/2016   Procedure: TOTAL HIP ARTHROPLASTY ANTERIOR APPROACH;  Surgeon: Renette Butters, MD;  Location: [place];  Service: Orthopedics;  Laterality: Left;    Social History: Social History   Tobacco Use  . Smoking status: Current Some Day Smoker    Packs/day: 0.25    Years: 47.00    Pack years: 11.75    Types: Cigarettes  . Smokeless tobacco: Never Used  . Tobacco comment: 2  cigarettes  Substance Use Topics  . Alcohol use: No    Comment: quit 2012  . Drug use: Not Currently    Types: Cocaine    Comment: many yrs ago., last time- late 2016   Additional social history: Denies any alcohol (>40 years). Has done cocaine >5 years ago. Smokes 1-2 cigarettes/day.  Please also refer to relevant sections of EMR.  Family History: Family History  Problem Relation Age of Onset  . Heart disease Father    Allergies and Medications: Allergies  Allergen Reactions  . Poison Ivy Extract [Poison Ivy Extract] Itching and Rash   No current facility-administered medications on file prior to encounter.  Current Outpatient Medications on File Prior to Encounter  Medication Sig Dispense Refill  . amLODipine (NORVASC) 10 MG tablet Take 10 mg by mouth daily.    . diazepam (VALIUM) 5 MG tablet 1 po tid for spasm pain (Patient not taking: Reported on 03/08/2019) 21 tablet 0  . docusate sodium (COLACE) 100 MG capsule Take 1 capsule (100 mg total) by mouth 2 (two) times daily.    Marland Kitchen gabapentin (NEURONTIN) 300 MG capsule Take 300 mg by mouth 3 (three) times daily.    Marland Kitchen glucose 4 GM chewable tablet Chew 1 tablet by mouth as needed for low blood sugar.    . labetalol (NORMODYNE) 200 MG tablet Take 200 mg by mouth 2 (two) times daily.    . metFORMIN (GLUCOPHAGE) 500 MG tablet Take 500 mg by mouth 2 (two) times daily with a meal.    . ondansetron (ZOFRAN) 4 MG tablet Take 1 tablet (4 mg total) by mouth every 8 (eight) hours as needed for nausea or vomiting. 20 tablet 0  . Oxycodone HCl 10 MG TABS Take 1 tablet (10 mg total) by mouth 3 (three) times daily as needed (pain.). 10 tablet 0  . sacubitril-valsartan (ENTRESTO) 24-26 MG Take 1 tablet by mouth 2 (two) times daily. 60 tablet 2  . simvastatin (ZOCOR) 40 MG tablet Take 40 mg by mouth at bedtime.     Marland Kitchen tiZANidine (ZANAFLEX) 4 MG tablet Take 2-4 mg by mouth 2 (two) times daily as needed for muscle spasms.       Objective: BP (!)  103/56   Pulse 73   Temp (!) 97.5 F (36.4 C) (Oral)   Resp 17   Ht 6\' 6"  (1.981 m)   Wt 117.9 kg   SpO2 100%   BMI 30.05 kg/m  Exam: General: pleasant older african [place] male, well nourished, well developed, in no acute distress with non-toxic appearance, sitting up comfortably in Ed bed HEENT: normocephalic, atraumatic, moist mucous membranes, oropharynx without erythema or exudates, uvula midline, PERRL Neck: supple, normal ROM CV: regular rate and rhythm without murmurs, rubs, or gallops, no lower extremity edema, 2+ radial and pedal pulses bilaterally Lungs: clear to auscultation bilaterally with normal work of breathing no room air Abdomen: soft, tender along incision line of left lower abdomen, non-distended, normoactive bowel sounds Skin: warm, dry, well appearing abdominal incision on LLQ and small incisions from laparoscopy without signs of infection  Extremities: warm and well perfused, normal tone Neuro: Alert and orientedx3, speech normal, CX II-XII intact, strength and sensory intact, PERRL, EOMI  Labs and Imaging: CBC BMET  Recent Labs  Lab 04/18/19 1137  WBC 11.7*  HGB 11.9*  HCT 37.5*  PLT 316   Recent Labs  Lab 04/18/19 1137  NA 140  K 4.8  CL 105  CO2 23  BUN 37*  CREATININE 2.54*  GLUCOSE 159*  CALCIUM 9.5     Troponin <0.03 ISTAT Troponin: 0.00 PT/INR: 14.5/1.1 BNP: 6.9 CBG 162 COVID negative  Urinalysis    Component Value Date/Time   COLORURINE YELLOW 04/18/2019 1516   APPEARANCEUR CLEAR 04/18/2019 1516   LABSPEC 1.023 04/18/2019 1516   PHURINE 5.0 04/18/2019 1516   GLUCOSEU NEGATIVE 04/18/2019 [place] 04/18/2019 1516   BILIRUBINUR NEGATIVE 04/18/2019 1516   KETONESUR NEGATIVE 04/18/2019 1516   PROTEINUR 30 (A) 04/18/2019 1516   UROBILINOGEN 1.0 03/08/2015 1011   NITRITE NEGATIVE 04/18/2019 1516   LEUKOCYTESUR NEGATIVE 04/18/2019 1516    Ct Abdomen Pelvis Wo  Contrast  Result Date: 04/18/2019 CLINICAL DATA:   Recent oophorectomy on the left with pain, initial encounter EXAM: CT ABDOMEN AND PELVIS WITHOUT CONTRAST TECHNIQUE: Multidetector CT imaging of the abdomen and pelvis was performed following the standard protocol without IV contrast. COMPARISON:  Mild bibasilar atelectatic changes are noted. FINDINGS: Lower chest: No acute abnormality. Hepatobiliary: No focal liver abnormality is seen. No gallstones, gallbladder wall thickening, or biliary dilatation. Pancreas: Unremarkable. No pancreatic ductal dilatation or surrounding inflammatory changes. Spleen: Normal in size without focal abnormality. Adrenals/Urinary Tract: The adrenal glands are within normal limits. Postsurgical changes are noted on the left consistent with complete nephrectomy. No sizable hematoma or fluid collection is noted. No abscess is seen. The right kidney demonstrates no obstructive change. The bladder is partially distended. Stomach/Bowel: The appendix is within normal limits. No obstructive or inflammatory changes of the large or small bowel are seen. Vascular/Lymphatic: Aortic atherosclerosis. No enlarged abdominal or pelvic lymph nodes. Reproductive: Prostate is unremarkable. Other: No abdominal wall hernia or abnormality. No abdominopelvic ascites. Postsurgical changes are noted in the subcutaneous tissues consistent with the robotic ports. Musculoskeletal: Bilateral hip replacements are noted. Lumbar fusion is seen as well. No acute bony abnormality is seen. IMPRESSION: Status post left nephrectomy. No postoperative hematoma or seroma is seen. Bibasilar atelectatic changes are seen. Electronically Signed   By: Inez Catalina M.D.   On: 04/18/2019 15:45   Ct Head Wo Contrast  Result Date: 04/18/2019 CLINICAL DATA:  Syncope EXAM: CT HEAD WITHOUT CONTRAST TECHNIQUE: Contiguous axial images were obtained from the base of the skull through the vertex without intravenous contrast. COMPARISON:  Apr 18, 2019 study obtained earlier in the day;  brain MRI February 02, 2017 FINDINGS: Brain: There is mild diffuse atrophy for age. There is no intracranial mass, hemorrhage, extra-axial fluid collection, or midline shift. There is patchy small vessel disease in the centra semiovale bilaterally, stable. There is evidence of a prior lacunar infarct involving the genu of the right internal capsule and immediately adjacent lateral anterior left thalamus. No acute infarct is demonstrable. There is benign calcification along the posterior left falx, stable. Vascular: There is no hyperdense vessel. There is calcification in each distal vertebral artery as well as in each carotid siphon region. Skull: The bony calvarium appears intact. Sinuses/Orbits: There is mild mucosal thickening in several anterior ethmoid air cells. Other visualized paranasal sinuses are clear. Orbits appear symmetric bilaterally. Other: Mastoid air cells are clear. IMPRESSION: 1. Mild diffuse atrophy for age. 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 Prior small infarct at the genu of the left internal capsule and adjacent anterolateral thalamus. 34 no acute infarct evident. No apparent mass or hemorrhage. Benign-appearing calcification noted adjacent to the left falx shows no surrounding edema or mass effect and is not felt to have clinical significance. 2.  Foci of arterial vascular calcification noted. 3.  Mild mucosal thickening several ethmoid air cells. Electronically Signed   By: Lowella Grip III M.D.   On: 04/18/2019 15:48   Dg Chest Port 1 View  Result Date: 04/18/2019 CLINICAL DATA:  Chest pain.  Syncopal episode. EXAM: PORTABLE CHEST 1 VIEW COMPARISON:  Radiographs 04/18/2019, 03/30/2019 and 01/14/2019. FINDINGS: 1156 hours. Stable chronic elevation of the left hemidiaphragm with associated chronic left basilar atelectasis or scarring. There is no confluent airspace opacity, edema, pleural effusion or pneumothorax. Mild enlargement of the cardiac silhouette is  stable. No acute osseous findings. IMPRESSION: Stable chronic left basilar atelectasis or scarring. No acute findings. Electronically Signed  By: Richardean Sale M.D.   On: 04/18/2019 12:21    Danna Hefty, DO 04/18/2019, 3:57 PM PGY-1, [place] Intern pager: [phone], text pages welcome ---------------------------------------------------------------------------------------------------------------- Upper Level Addendum: I have seen and evaluated this patient along with Dr. Tarry Kos and reviewed the above note, making necessary revisions in blue.  Guadalupe Dawn MD PGY-2 Family Medicine Resident

## 2019-04-18 NOTE — ED Notes (Signed)
Doctor spoke with wife on phone and nurse had patient facetime with wife and his mother.

## 2019-04-18 NOTE — ED Notes (Signed)
ED TO INPATIENT HANDOFF REPORT  ED Nurse Name and Phone #: Dream Harman [phone]  S Name/Age/Gender Raymond Barrera. 58 y.o. male Room/Bed: 037C/037C  Code Status   Code Status: Prior  Home/SNF/Other Home Patient oriented to: self, place, time and situation Is this baseline? Yes   Triage Complete: Triage complete  Chief Complaint poss stemi  Triage Note Pt BIB [facility] as transfer from [facility] for r/o STEMI. Per [facility], pt was called out initially as a STEMI, cards saw EKG and declined. Pt denies CP on arrival, denies SOB/N/V. Pt rec'd 324 ASA at OSH and running on 16u/kg/hr Heparin on arrival. Pt in NAD.   Allergies Allergies  Allergen Reactions  . Poison Ivy Extract [Poison Ivy Extract] Itching and Rash    Level of Care/Admitting Diagnosis ED Disposition    ED Disposition Condition [facility] Area: [place] [[id number]]  Level of Care: Telemetry Medical [[id number]]  Covid Evaluation: N/A  Diagnosis: Syncope [509326]  Admitting Physician: Zenia Resides [[id number]]  Attending Physician: Madison Hickman A [[id number]]  PT Class (Do Not Modify): Observation [104]  PT Acc Code (Do Not Modify): Observation [10022]       B Medical/Surgery History Past Medical History:  Diagnosis Date  . Anxiety    due to the stroke  . Arthritis   . Back pain    hx of buldging disc  . Diabetes mellitus without complication ([place])    takes Metformin daily  . High cholesterol    takes Zocor daily  . Hypertension    takes Benazepril and HCTZ  daily  . Joint pain   . Joint swelling   . Memory impairment    occassional - from stroke  . Myocardial infarction ([place]) 1987  . Pneumonia    hx of-80's  . PONV (postoperative nausea and vomiting)    took a while for him to wake up after previous anesthesia  . Shortness of breath dyspnea    do to pain  . Sleep apnea    never had a sleep study,but states Dr. Cindie Laroche says he has it  . Slurred speech   .  Stroke ([place]) 08/2013   7 mini-strokes, last stroke 2017  . TIA (transient ischemic attack) 2014   x 7   . Urinary frequency    Past Surgical History:  Procedure Laterality Date  . ABDOMINAL EXPOSURE N/A 06/16/2018   Procedure: ABDOMINAL EXPOSURE;  Surgeon: Rosetta Posner, MD;  Location: [facility];  Service: Vascular;  Laterality: N/A;  . ANKLE SURGERY  2008   left ankle-otif-[facility]  . ANTERIOR LUMBAR FUSION Bilateral 06/16/2018   Procedure: LUMBAR 4-5 LUMBAR 5-SACRUM 1 ANTERIOR LUMBAR INTERBODY FUSION WITH INSTRUMENTATION AND ALLOGRAFT;  Surgeon: Phylliss Bob, MD;  Location: [facility];  Service: Orthopedics;  Laterality: Bilateral;  . BACK SURGERY    . HEMATOMA EVACUATION Left 08/13/2018   Procedure: EVACUATION HEMATOMA LEFT ABDOMINAL WALL;  Surgeon: Rosetta Posner, MD;  Location[facility];  Service: Vascular;  Laterality: Left;  . IR RADIOLOGIST EVAL & MGMT  07/27/2018  . IR US GUIDE BX ASP/DRAIN  07/14/2018  . JOINT REPLACEMENT     both hips replaced   . LUMBAR LAMINECTOMY/DECOMPRESSION MICRODISCECTOMY Right 10/17/2014   Procedure: LUMBAR LAMINECTOMY/DECOMPRESSION MICRODISCECTOMY 2 LEVELS;  Surgeon: [name], MD;  Location: [facility];  Service: Neurosurgery;  Laterality: Right;  Right L45 L5S1 laminectomy and foraminotomy  . MASS EXCISION  09/13/2012   Procedure: EXCISION MASS;  Surgeon: Jamesetta So, MD;  Location: [facility];  Service: General;  Laterality: N/A;  . ROBOTIC ASSITED PARTIAL NEPHRECTOMY Left 03/30/2019   Procedure: XI ROBOTIC ASSITED PARTIAL NEPHRECTOMY POSSIBLE RADICAL NEPHRECTOMY;  Surgeon: Ceasar Mons, MD;  Location: [facility];  Service: Urology;  Laterality: Left;  . TONSILLECTOMY    . TOTAL HIP ARTHROPLASTY Right 03/20/2015  . TOTAL HIP ARTHROPLASTY Right 03/20/2015   Procedure: TOTAL HIP ARTHROPLASTY ANTERIOR APPROACH;  Surgeon: Renette Butters, MD;  Location: [facility];  Service: Orthopedics;  Laterality: Right;  . TOTAL HIP ARTHROPLASTY Left 01/08/2016    Procedure: TOTAL HIP ARTHROPLASTY ANTERIOR APPROACH;  Surgeon: Renette Butters, MD;  Location: [facility];  Service: Orthopedics;  Laterality: Left;     A IV Location/Drains/Wounds Patient Lines/Drains/Airways Status   Active Line/Drains/Airways    Name:   Placement date:   Placement time:   Site:   Days:   Peripheral IV 04/18/19 Right Antecubital   04/18/19    -    Antecubital   less than 1   Peripheral IV 04/18/19 Left Antecubital   04/18/19    -    Antecubital   less than 1   Closed System Drain 1 Left;Anterior LLQ Other (Comment) 12 Fr.   07/14/18    1441    LLQ   278   Closed System Drain 1 Left Abdomen Bulb (JP) 15 Fr.   08/13/18    1236    Abdomen   248   Incision (Closed) 08/13/18 Abdomen Other (Comment)   08/13/18    1240     248   Incision (Closed) 03/30/19 Abdomen Left   03/30/19    1101     19   Incision - 4 Ports Abdomen 1: Left;Proximal;Mid 2: Left;Distal;Upper 3: Left;Distal;Medial 4: Left;Distal;Lower   03/30/19    -     19          Intake/Output Last 24 hours No intake or output data in the 24 hours ending 04/18/19 1625  Labs/Imaging Results for orders placed or performed during the hospital encounter of 04/18/19 (from the past 48 hour(s))  Troponin I - Once     Status: None   Collection Time: 04/18/19 11:37 AM  Result Value Ref Range   Troponin I <0.03 <0.03 ng/mL    Comment: Performed at [facility], [place] [address], [place], [place] [address]  Comprehensive metabolic panel     Status: Abnormal   Collection Time: 04/18/19 11:37 AM  Result Value Ref Range   Sodium 140 135 - 145 mmol/L   Potassium 4.8 3.5 - 5.1 mmol/L   Chloride 105 98 - 111 mmol/L   CO2 23 22 - 32 mmol/L   Glucose, Bld 159 (H) 70 - 99 mg/dL   BUN 37 (H) 6 - 20 mg/dL   Creatinine, Ser 2.54 (H) 0.61 - 1.24 mg/dL   Calcium 9.5 8.9 - 10.3 mg/dL   Total Protein 7.8 6.5 - 8.1 g/dL   Albumin 3.7 3.5 - 5.0 g/dL   AST 17 15 - 41 U/L   ALT 21 0 - 44 U/L   Alkaline Phosphatase 101 38 - 126 U/L    Total Bilirubin 0.7 0.3 - 1.2 mg/dL   GFR calc non Af Amer 26 (L) >60 mL/min   GFR calc Af Amer 31 (L) >60 mL/min   Anion gap 12 5 - 15    Comment: Performed at [facility] [address] [place], [place]  [address]  CBC with Differential     Status: Abnormal   Collection Time: 04/18/19 11:37 AM  Result Value Ref Range   WBC 11.7 (H) 4.0 - 10.5 K/uL   RBC 4.11 (L) 4.22 - 5.81 MIL/uL   Hemoglobin 11.9 (L) 13.0 - 17.0 g/dL   HCT 37.5 (L) 39.0 - 52.0 %   MCV 91.2 80.0 - 100.0 fL   MCH 29.0 26.0 - 34.0 pg   MCHC 31.7 30.0 - 36.0 g/dL   RDW 13.7 11.5 - 15.5 %   Platelets 316 150 - 400 K/uL   nRBC 0.0 0.0 - 0.2 %   Neutrophils Relative % 87 %   Neutro Abs 10.1 (H) 1.7 - 7.7 K/uL   Lymphocytes Relative 8 %   Lymphs Abs 1.0 0.7 - 4.0 K/uL   Monocytes Relative 4 %   Monocytes Absolute 0.5 0.1 - 1.0 K/uL   Eosinophils Relative 1 %   Eosinophils Absolute 0.1 0.0 - 0.5 K/uL   Basophils Relative 0 %   Basophils Absolute 0.0 0.0 - 0.1 K/uL   Immature Granulocytes 0 %   Abs Immature Granulocytes 0.05 0.00 - 0.07 K/uL    Comment: Performed at [facility], [address], [place], [place] [address]  Protime-INR     Status: None   Collection Time: 04/18/19 11:37 AM  Result Value Ref Range   Prothrombin Time 14.5 11.4 - 15.2 seconds   INR 1.1 0.8 - 1.2    Comment: (NOTE) INR goal varies based on device and disease states. Performed at [facility], [place] [address] [place], [place] [address]   Brain natriuretic peptide     Status: None   Collection Time: 04/18/19 11:38 AM  Result Value Ref Range   B Natriuretic Peptide 6.9 0.0 - 100.0 pg/mL    Comment: Performed at [facility] [address] [place], [place] [address]  I-Stat Troponin, ED (not at [facility])     Status: None   Collection Time: 04/18/19 11:45 AM  Result Value Ref Range   Troponin i, poc 0.00 0.00 - 0.08 ng/mL   Comment 3            Comment: Due to the release kinetics of cTnI, a negative result  within the first hours of the onset of symptoms does not rule out myocardial infarction with certainty. If myocardial infarction is still suspected, repeat the test at appropriate intervals.   CBG monitoring, ED     Status: Abnormal   Collection Time: 04/18/19 12:50 PM  Result Value Ref Range   Glucose-Capillary 162 (H) 70 - 99 mg/dL  SARS Coronavirus 2 (CEPHEID - Performed in [facility] hospital lab), Hosp Order     Status: None   Collection Time: 04/18/19  1:35 PM  Result Value Ref Range   SARS Coronavirus 2 NEGATIVE NEGATIVE    Comment: (NOTE) If result is NEGATIVE SARS-CoV-2 target nucleic acids are NOT DETECTED. The SARS-CoV-2 RNA is generally detectable in upper and lower  respiratory specimens during the acute phase of infection. The lowest  concentration of SARS-CoV-2 viral copies this assay can detect is 250  copies / mL. A negative result does not preclude SARS-CoV-2 infection  and should not be used as the sole basis for treatment or other  patient management decisions.  A negative result may occur with  improper specimen collection / handling, submission of specimen other  than nasopharyngeal swab, presence of viral mutation(s) within the  areas targeted by this assay, and inadequate number of viral copies  (<250 copies / mL). A negative result must be combined with clinical  observations, patient history, and epidemiological information. If result is POSITIVE SARS-CoV-2 target nucleic acids are DETECTED. The SARS-CoV-2 RNA is generally detectable in upper and lower  respiratory specimens dur ing the acute phase of infection.  Positive  results are indicative of active infection with SARS-CoV-2.  Clinical  correlation with patient history and other diagnostic information is  necessary to determine patient infection status.  Positive results do  not rule out bacterial infection or co-infection with other viruses. If result is PRESUMPTIVE POSTIVE SARS-CoV-2 nucleic  acids MAY BE PRESENT.   A presumptive positive result was obtained on the submitted specimen  and confirmed on repeat testing.  While 2019 novel coronavirus  (SARS-CoV-2) nucleic acids may be present in the submitted sample  additional confirmatory testing may be necessary for epidemiological  and / or clinical management purposes  to differentiate between  SARS-CoV-2 and other Sarbecovirus currently known to infect humans.  If clinically indicated additional testing with an alternate test  methodology [phone]) is advised. The SARS-CoV-2 RNA is generally  detectable in upper and lower respiratory sp ecimens during the acute  phase of infection. The expected result is Negative. Fact Sheet for Patients:  StrictlyIdeas.no Fact Sheet for Healthcare Providers: BankingDealers.co.za This test is not yet approved or cleared by the [place] FDA and has been authorized for detection and/or diagnosis of SARS-CoV-2 by FDA under an Emergency Use Authorization (EUA).  This EUA will remain in effect (meaning this test can be used) for the duration of the COVID-19 declaration under Section 564(b)(1) of the Act, 21 U.S.C. section 360bbb-3(b)(1), unless the authorization is terminated or revoked sooner. Performed at [facility], [place] [address], [place], [place] [address]   Urinalysis, Routine w reflex microscopic     Status: Abnormal   Collection Time: 04/18/19  3:16 PM  Result Value Ref Range   Color, Urine YELLOW YELLOW   APPearance CLEAR CLEAR   Specific Gravity, Urine 1.023 1.005 - 1.030   pH 5.0 5.0 - 8.0   Glucose, UA NEGATIVE NEGATIVE mg/dL   Hgb urine dipstick NEGATIVE NEGATIVE   Bilirubin Urine NEGATIVE NEGATIVE   Ketones, ur NEGATIVE NEGATIVE mg/dL   Protein, ur 30 (A) NEGATIVE mg/dL   Nitrite NEGATIVE NEGATIVE   Leukocytes,Ua NEGATIVE NEGATIVE   RBC / HPF 0-5 0 - 5 RBC/hpf   WBC, UA 0-5 0 - 5 WBC/hpf   Bacteria, UA RARE (A)  NONE SEEN   Mucus PRESENT    Hyaline Casts, UA PRESENT     Comment: Performed at [facility] [address], [place], [place] [address]   Ct Abdomen Pelvis Wo Contrast  Result Date: 04/18/2019 CLINICAL DATA:  Recent oophorectomy on the left with pain, initial encounter EXAM: CT ABDOMEN AND PELVIS WITHOUT CONTRAST TECHNIQUE: Multidetector CT imaging of the abdomen and pelvis was performed following the standard protocol without IV contrast. COMPARISON:  Mild bibasilar atelectatic changes are noted. FINDINGS: Lower chest: No acute abnormality. Hepatobiliary: No focal liver abnormality is seen. No gallstones, gallbladder wall thickening, or biliary dilatation. Pancreas: Unremarkable. No pancreatic ductal dilatation or surrounding inflammatory changes. Spleen: Normal in size without focal abnormality. Adrenals/Urinary Tract: The adrenal glands are within normal limits. Postsurgical changes are noted on the left consistent with complete nephrectomy. No sizable hematoma or fluid collection is noted. No abscess is seen. The right  kidney demonstrates no obstructive change. The bladder is partially distended. Stomach/Bowel: The appendix is within normal limits. No obstructive or inflammatory changes of the large or small bowel are seen. Vascular/Lymphatic: Aortic atherosclerosis. No enlarged abdominal or pelvic lymph nodes. Reproductive: Prostate is unremarkable. Other: No abdominal wall hernia or abnormality. No abdominopelvic ascites. Postsurgical changes are noted in the subcutaneous tissues consistent with the robotic ports. Musculoskeletal: Bilateral hip replacements are noted. Lumbar fusion is seen as well. No acute bony abnormality is seen. IMPRESSION: Status post left nephrectomy. No postoperative hematoma or seroma is seen. Bibasilar atelectatic changes are seen. Electronically Signed   By: Inez Catalina M.D.   On: 04/18/2019 15:45   Ct Head Wo Contrast  Result Date: 04/18/2019 CLINICAL DATA:   Syncope EXAM: CT HEAD WITHOUT CONTRAST TECHNIQUE: Contiguous axial images were obtained from the base of the skull through the vertex without intravenous contrast. COMPARISON:  Apr 18, 2019 study obtained earlier in the day; brain MRI February 02, 2017 FINDINGS: Brain: There is mild diffuse atrophy for age. There is no intracranial mass, hemorrhage, extra-axial fluid collection, or midline shift. There is patchy small vessel disease in the centra semiovale bilaterally, stable. There is evidence of a prior lacunar infarct involving the genu of the right internal capsule and immediately adjacent lateral anterior left thalamus. No acute infarct is demonstrable. There is benign calcification along the posterior left falx, stable. Vascular: There is no hyperdense vessel. There is calcification in each distal vertebral artery as well as in each carotid siphon region. Skull: The bony calvarium appears intact. Sinuses/Orbits: There is mild mucosal thickening in several anterior ethmoid air cells. Other visualized paranasal sinuses are clear. Orbits appear symmetric bilaterally. Other: Mastoid air cells are clear. IMPRESSION: 1. Mild diffuse atrophy for age. 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 Prior small infarct at the genu of the left internal capsule and adjacent anterolateral thalamus. 34 no acute infarct evident. No apparent mass or hemorrhage. Benign-appearing calcification noted adjacent to the left falx shows no surrounding edema or mass effect and is not felt to have clinical significance. 2.  Foci of arterial vascular calcification noted. 3.  Mild mucosal thickening several ethmoid air cells. Electronically Signed   By: Lowella Grip III M.D.   On: 04/18/2019 15:48   Dg Chest Port 1 View  Result Date: 04/18/2019 CLINICAL DATA:  Chest pain.  Syncopal episode. EXAM: PORTABLE CHEST 1 VIEW COMPARISON:  Radiographs 04/18/2019, 03/30/2019 and 01/14/2019. FINDINGS: 1156 hours. Stable chronic  elevation of the left hemidiaphragm with associated chronic left basilar atelectasis or scarring. There is no confluent airspace opacity, edema, pleural effusion or pneumothorax. Mild enlargement of the cardiac silhouette is stable. No acute osseous findings. IMPRESSION: Stable chronic left basilar atelectasis or scarring. No acute findings. Electronically Signed   By: Richardean Sale M.D.   On: 04/18/2019 12:21    Pending Labs Unresulted Labs (From admission, onward)   None      Vitals/Pain Today's Vitals   04/18/19 1330 04/18/19 1452 04/18/19 1515 04/18/19 1615  BP: 115/84  (!) 103/56 113/62  Pulse: 69  73 74  Resp: 16  17 12   Temp:      TempSrc:      SpO2: 100%  100% 100%  Weight:      Height:      PainSc:  0-No pain      Isolation Precautions No active isolations  Medications Medications - No data to display  Mobility walks Moderate fall risk  Focused Assessments Cardiac Assessment Handoff:  Cardiac Rhythm: Normal sinus rhythm Lab Results  Component Value Date   TROPONINI <0.03 04/18/2019   Lab Results  Component Value Date   DDIMER  02/03/2011    0.25        AT THE INHOUSE ESTABLISHED CUTOFF VALUE OF 0.48 ug/mL FEU, THIS ASSAY HAS BEEN DOCUMENTED IN THE LITERATURE TO HAVE A SENSITIVITY AND NEGATIVE PREDICTIVE VALUE OF AT LEAST 98 TO 99%.  THE TEST RESULT SHOULD BE CORRELATED WITH AN ASSESSMENT OF THE CLINICAL PROBABILITY OF DVT / VTE.   Does the Patient currently have chest pain? No     R Recommendations: See Admitting Provider Note  Report given to:   Additional Notes: Pt has had no CP since arrival.  A&O.  Denies any pain at this time

## 2019-04-18 NOTE — ED Triage Notes (Signed)
Pt BIB [facility] as transfer from [facility] for r/o STEMI. Per [facility], pt was called out initially as a STEMI, cards saw EKG and declined. Pt denies CP on arrival, denies SOB/N/V. Pt rec'd 324 ASA at OSH and running on 16u/kg/hr Heparin on arrival. Pt in NAD.

## 2019-04-18 NOTE — Telephone Encounter (Signed)
New Message    Bonnita Nasuti is calling because they are transporting the pt to the hospital now, she said he is having a heart attack  She wants to make Dr Sallyanne Kuster aware   Please call back

## 2019-04-18 NOTE — ED Notes (Signed)
Pt back from CT

## 2019-04-18 NOTE — Telephone Encounter (Signed)
Will forward message to Dr Sallyanne Kuster .Adonis Housekeeper

## 2019-04-18 NOTE — ED Provider Notes (Signed)
[facility] EMERGENCY DEPARTMENT Provider Note   CSN: [id number] Arrival date & time: 04/18/19  1122    History   Chief Complaint Chief Complaint  Patient presents with  . Pre Syncope    HPI Raymond Bartol. is a 58 y.o. male.     58 year old male with prior medical history as detailed below presents as a possible STEMI from [facility] ED.  Patient was being transported this morning to his orthopedic doctor for a routine appointment.  During the transport the patient became weak, diaphoretic, and passed out reportedly twice.  The details of his care at [facility] him are unclear.  Per EMS report, he was considered to be a possible STEMI and transferred to [facility] for further evaluation.  Cardiology is aware of the case.  Per their report, initial discussion regarding the patient occurred while the on-call physician was involved in a cast.  He accepted the transfer without reviewing the EKG.  Subsequently, the EKG was reviewed and no STEMI was seen.  Upon arrival to the ED at [facility], the patient is improved.  He denies chest pain.  He denies shortness of breath.  He cannot recall the exact events leading to his recent visit at [facility].  Additional information obtained from patient and spouse -Raymond Barrera - at [phone].    The history is provided by the patient, medical records and the spouse.  Loss of Consciousness  Episode history:  Multiple Most recent episode:  Today Timing:  Rare Progression:  Waxing and waning Chronicity:  New Witnessed: no   Relieved by:  Nothing Worsened by:  Nothing Ineffective treatments:  None tried Associated symptoms: no chest pain     Past Medical History:  Diagnosis Date  . Anxiety    due to the stroke  . Arthritis   . Back pain    hx of buldging disc  . Diabetes mellitus without complication ([place])    takes Metformin daily  . High cholesterol    takes Zocor daily  . Hypertension    takes  Benazepril and HCTZ  daily  . Joint pain   . Joint swelling   . Memory impairment    occassional - from stroke  . Myocardial infarction ([place]) 1987  . Pneumonia    hx of-80's  . PONV (postoperative nausea and vomiting)    took a while for him to wake up after previous anesthesia  . Shortness of breath dyspnea    do to pain  . Sleep apnea    never had a sleep study,but states Dr. Cindie Laroche says he has it  . Slurred speech   . Stroke ([place]) 08/2013   7 mini-strokes, last stroke 2017  . TIA (transient ischemic attack) 2014   x 7   . Urinary frequency     Patient Active Problem List   Diagnosis Date Noted  . Renal mass 03/30/2019  . Abdominal hematoma 08/13/2018  . Radiculopathy 06/16/2018  . Acute CVA (cerebrovascular accident) ([place]) 02/01/2017  . Diabetes mellitus ([place]) 02/01/2017  . HTN (hypertension) 02/01/2017  . Primary osteoarthritis of left hip 01/08/2016  . DJD (degenerative joint disease) 03/20/2015  . Primary osteoarthritis of right hip 02/26/2015  . Lumbar spondylosis 10/17/2014  . Lumbago 07/25/2014  . Abnormality of gait 07/25/2014  . Difficulty in walking(719.7) 07/25/2014  . TIA (transient ischemic attack) 03/09/2013  . Cocaine abuse ([place]) 03/09/2013  . Accelerated hypertension 03/09/2013  . Hyperlipidemia 03/09/2013  . Current  smoker 03/09/2013    Past Surgical History:  Procedure Laterality Date  . ABDOMINAL EXPOSURE N/A 06/16/2018   Procedure: ABDOMINAL EXPOSURE;  Surgeon: Rosetta Posner, MD;  Location: [facility];  Service: Vascular;  Laterality: N/A;  . ANKLE SURGERY  2008   left ankle-otif-[facility]  . ANTERIOR LUMBAR FUSION Bilateral 06/16/2018   Procedure: LUMBAR 4-5 LUMBAR 5-SACRUM 1 ANTERIOR LUMBAR INTERBODY FUSION WITH INSTRUMENTATION AND ALLOGRAFT;  Surgeon: Phylliss Bob, MD;  Location: [facility];  Service: Orthopedics;  Laterality: Bilateral;  . BACK SURGERY    . HEMATOMA EVACUATION Left 08/13/2018   Procedure: EVACUATION HEMATOMA LEFT ABDOMINAL WALL;   Surgeon: Rosetta Posner, MD;  Location: [facility] OR;  Service: Vascular;  Laterality: Left;  . IR RADIOLOGIST EVAL & MGMT  07/27/2018  . IR US GUIDE BX ASP/DRAIN  07/14/2018  . JOINT REPLACEMENT     both hips replaced   . LUMBAR LAMINECTOMY/DECOMPRESSION MICRODISCECTOMY Right 10/17/2014   Procedure: LUMBAR LAMINECTOMY/DECOMPRESSION MICRODISCECTOMY 2 LEVELS;  Surgeon: [name], MD;  Location: [facility] NEURO ORS;  Service: Neurosurgery;  Laterality: Right;  Right L45 L5S1 laminectomy and foraminotomy  . MASS EXCISION  09/13/2012   Procedure: EXCISION MASS;  Surgeon: Jamesetta So, MD;  Location: [facility];  Service: General;  Laterality: N/A;  . ROBOTIC ASSITED PARTIAL NEPHRECTOMY Left 03/30/2019   Procedure: XI ROBOTIC ASSITED PARTIAL NEPHRECTOMY POSSIBLE RADICAL NEPHRECTOMY;  Surgeon: Ceasar Mons, MD;  Location: [facility];  Service: Urology;  Laterality: Left;  . TONSILLECTOMY    . TOTAL HIP ARTHROPLASTY Right 03/20/2015  . TOTAL HIP ARTHROPLASTY Right 03/20/2015   Procedure: TOTAL HIP ARTHROPLASTY ANTERIOR APPROACH;  Surgeon: Renette Butters, MD;  Location: [facility];  Service: Orthopedics;  Laterality: Right;  . TOTAL HIP ARTHROPLASTY Left 01/08/2016   Procedure: TOTAL HIP ARTHROPLASTY ANTERIOR APPROACH;  Surgeon: Renette Butters, MD;  Location: [facility];  Service: Orthopedics;  Laterality: Left;        Home Medications    Prior to Admission medications   Medication Sig Start Date End Date Taking? Authorizing Provider  amLODipine (NORVASC) 10 MG tablet Take 10 mg by mouth daily.    [provider]  diazepam (VALIUM) 5 MG tablet 1 po tid for spasm pain Patient not taking: Reported on 03/08/2019 10/15/18   Lily Kocher, PA-C  docusate sodium (COLACE) 100 MG capsule Take 1 capsule (100 mg total) by mouth 2 (two) times daily. 03/30/19   Debbrah Alar, PA-C  gabapentin (NEURONTIN) 300 MG capsule Take 300 mg by mouth 3 (three) times daily.    [provider]  glucose 4 GM  chewable tablet Chew 1 tablet by mouth as needed for low blood sugar.    [provider]  labetalol (NORMODYNE) 200 MG tablet Take 200 mg by mouth 2 (two) times daily.    [provider]  metFORMIN (GLUCOPHAGE) 500 MG tablet Take 500 mg by mouth 2 (two) times daily with a meal.    [provider]  ondansetron (ZOFRAN) 4 MG tablet Take 1 tablet (4 mg total) by mouth every 8 (eight) hours as needed for nausea or vomiting. 03/30/19   Debbrah Alar, PA-C  Oxycodone HCl 10 MG TABS Take 1 tablet (10 mg total) by mouth 3 (three) times daily as needed (pain.). 03/30/19   Debbrah Alar, PA-C  sacubitril-valsartan (ENTRESTO) 24-26 MG Take 1 tablet by mouth 2 (two) times daily. 03/10/19   Croitoru, Mihai, MD  simvastatin (ZOCOR) 40 MG tablet Take 40 mg by mouth  at bedtime.     [provider]  tiZANidine (ZANAFLEX) 4 MG tablet Take 2-4 mg by mouth 2 (two) times daily as needed for muscle spasms.  02/22/19   [provider]    Family History Family History  Problem Relation Age of Onset  . Heart disease Father     Social History Social History   Tobacco Use  . Smoking status: Current Some Day Smoker    Packs/day: 0.25    Years: 47.00    Pack years: 11.75    Types: Cigarettes  . Smokeless tobacco: Never Used  . Tobacco comment: 2 cigarettes  Substance Use Topics  . Alcohol use: No    Comment: quit 2012  . Drug use: Not Currently    Types: Cocaine    Comment: many yrs ago., last time- late 2016     Allergies   Poison ivy extract [poison ivy extract]   Review of Systems Review of Systems  Cardiovascular: Positive for syncope. Negative for chest pain.  All other systems reviewed and are negative.    Physical Exam Updated Vital Signs BP 116/80   Pulse 64   Temp (!) 97.5 F (36.4 C) (Oral)   Resp 17   Ht 6\' 6"  (1.981 m)   Wt 117.9 kg   SpO2 100%   BMI 30.05 kg/m   Physical Exam Vitals signs and nursing note reviewed.  Constitutional:       General: He is not in acute distress.    Appearance: Normal appearance. He is well-developed.  HENT:     Head: Normocephalic and atraumatic.  Eyes:     Conjunctiva/sclera: Conjunctivae normal.     Pupils: Pupils are equal, round, and reactive to light.  Neck:     Musculoskeletal: Normal range of motion and neck supple.  Cardiovascular:     Rate and Rhythm: Normal rate and regular rhythm.     Heart sounds: Normal heart sounds.  Pulmonary:     Effort: Pulmonary effort is normal. No respiratory distress.     Breath sounds: Normal breath sounds.  Abdominal:     General: There is no distension.     Palpations: Abdomen is soft.     Tenderness: There is no abdominal tenderness.  Musculoskeletal: Normal range of motion.        General: No deformity.  Skin:    General: Skin is warm and dry.  Neurological:     Mental Status: He is alert and oriented to person, place, and time.      ED Treatments / Results  Labs (all labs ordered are listed, but only abnormal results are displayed) Labs Reviewed  COMPREHENSIVE METABOLIC PANEL - Abnormal; Notable for the following components:      Result Value   Glucose, Bld 159 (*)    BUN 37 (*)    Creatinine, Ser 2.54 (*)    GFR calc non Af Amer 26 (*)    GFR calc Af Amer 31 (*)    All other components within normal limits  CBC WITH DIFFERENTIAL/PLATELET - Abnormal; Notable for the following components:   WBC 11.7 (*)    RBC 4.11 (*)    Hemoglobin 11.9 (*)    HCT 37.5 (*)    Neutro Abs 10.1 (*)    All other components within normal limits  URINALYSIS, ROUTINE W REFLEX MICROSCOPIC - Abnormal; Notable for the following components:   Protein, ur 30 (*)    Bacteria, UA RARE (*)    All other  components within normal limits  CBG MONITORING, ED - Abnormal; Notable for the following components:   Glucose-Capillary 162 (*)    All other components within normal limits  SARS CORONAVIRUS 2 (HOSPITAL ORDER, [facility])   TROPONIN I  PROTIME-INR  BRAIN NATRIURETIC PEPTIDE  I-STAT TROPONIN, ED    EKG EKG Interpretation  Date/Time:  Monday Apr 18 2019 11:30:59 EDT Ventricular Rate:  68 PR Interval:    QRS Duration: 125 QT Interval:  427 QTC Calculation: 455 R Axis:   28 Text Interpretation:  Sinus rhythm Prolonged PR interval Nonspecific intraventricular conduction delay Nonspecific T abnormalities, inferior leads Confirmed by Dene Gentry [phone]) on 04/18/2019 11:36:02 AM Also confirmed by Dene Gentry [phone]), editor Hattie Perch [phone])  on 04/18/2019 12:46:32 PM   Radiology Dg Chest Port 1 View  Result Date: 04/18/2019 CLINICAL DATA:  Chest pain.  Syncopal episode. EXAM: PORTABLE CHEST 1 VIEW COMPARISON:  Radiographs 04/18/2019, 03/30/2019 and 01/14/2019. FINDINGS: 1156 hours. Stable chronic elevation of the left hemidiaphragm with associated chronic left basilar atelectasis or scarring. There is no confluent airspace opacity, edema, pleural effusion or pneumothorax. Mild enlargement of the cardiac silhouette is stable. No acute osseous findings. IMPRESSION: Stable chronic left basilar atelectasis or scarring. No acute findings. Electronically Signed   By: Richardean Sale M.D.   On: 04/18/2019 12:21    Procedures Procedures (including critical care time)  Medications Ordered in ED Medications - No data to display   Initial Impression / Assessment and Plan / ED Course  I have reviewed the triage vital signs and the nursing notes.  Pertinent labs & imaging results that were available during my care of the patient were reviewed by me and considered in my medical decision making (see chart for details).        MDM  Screen complete  Raymond Cush. was evaluated in Emergency Department on 04/18/2019 for the symptoms described in the history of present illness. He was evaluated in the context of the global COVID-19 pandemic, which necessitated consideration that the patient might  be at risk for infection with the SARS-CoV-2 virus that causes COVID-19. Institutional protocols and algorithms that pertain to the evaluation of patients at risk for COVID-19 are in a state of rapid change based on information released by regulatory bodies including the CDC and federal and state organizations. These policies and algorithms were followed during the patient's care in the ED.  Patient is presenting for evaluation.  Patient was transferred from [facility] ED.  He initially presented there as a syncope.  Apparently, he was felt to be a possible STEMI.  He was transferred to [facility] for possible cardiac intervention.  Cardiology is aware of case.  After review of EKG they do not feel that he is a STEMI.  Patient is without associated chest pain or shortness of breath.  Patient with reported presentation at outside facility secondary to initial syncopal event.  Work-up today does suggest possible element of dehydration -with possible AKI complicated by recent left kidney resection on April 29.  Internal Medicine service aware of case and will evaluate for admission.   Final Clinical Impressions(s) / ED Diagnoses   Final diagnoses:  Syncope, unspecified syncope type    ED Discharge Orders    None       Valarie Merino, MD 04/18/19 [phone]

## 2019-04-18 NOTE — ED Notes (Signed)
Spoke with wife on phone with patient regarding plan of care. Patient also spoke with his mother.

## 2019-04-18 NOTE — Progress Notes (Signed)
Family Medicine Teaching Service Daily Progress Note Intern Pager: [phone]  Patient name: Raymond Barrera. Medical record number: [id number] Date of birth: January 09, 1961 Age: 58 y.o. Gender: male  Primary Care Provider: Lucia Gaskins, MD Consultants: None Code Status: Full Emergency Contact: Shanon Ace [phone]  Pt Overview and Major Events to Date:  5/18: admitted to [facility], CT head, CT abd/pelvis  Assessment and Plan: Ruvim Risko. is a 58 y.o. male presenting after two episodes of syncope. PMH is significant for s/p left nephrectomy, HTN, HLD, T2DM,  HFrEF EF 20-25%, acute CVA, DJD, osteoarthritis  Syncope: S/p two syncopal episodes involving diaphoresis, SOB, nausea, vomiting, and episodes of eye deviation and abnormal body movements. No recurrent episodes since admission. CT head significant for old strokes, otherwise unremarkable. UDS and Ethanol negative. TSH WNL. Repeat AM EKG with NSR similar to yesterday. Echo improved from 2019 with EF 50-55% and otherwise normal wall motion. Orthostatic positive for drop in systolic BP from lying to sitting position. Differential include orthostatics vs over hypertensive treatment vs pulmonary embolism. Well's Score 2.5 with 16% risk of PE given recent surgery and malignancy. Given risk, will rule out PE - cards consulted, will follow up recs - Follow up MRI head without contrast - Follow up EEG  - V/Q scan - PT/OT eval - continuous cardiac monitoring x 24 hrs - continue Entresto, Labetalol - Discontinue Norvasc given lack of indication  - Consider switch to Coreg  - up with assistance  Leukocytosis with elevated ANC: resolved WBC 11.7>7.0. Remained afebrile without signs of infection.   AKI  S/p Left Nephrectomy on 4/29: improving Cr 2.54>2.18. Per reivew, expect baseline to double within first three months s/p nephrectomy.  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 Well healing  incision line, however some tenderness to palpation. CT abdomen/pelvis w/out contrast negative for hematoma or seroma. 0.4L UOP overnight. - continue to monitor, AM BMP - avoid nephrotoxic agents  Normocytic Anemia: Hgb 11.9>11.6 (baseline 12, although two years ago baseline was 15). Iron panel WNL: Iron 60, TIBC 307, Ferritin 169. No signs of bleed. 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 - continue to monitor - daily CBC  HF with improved EF: Echo much improved with EF 50-55% without wall motion abnormalities. Followed by [facility]. Home meds: Norvasc 10mg  QD, Labetalol 200mg  BID, Entresto 24-26mg  BID. - continue Labetolol 200mg  BID and Entresto 24-26mg  - Discontinue Norvasc due to concern for over treatment of HTN  - consider transitioning to Coreg given benefit in HFrEF  HTN: BP this AM 107/76. Home mesd: Norvasc 10mg , Entrestro 24/26mg  BID, Labetalol 200mg  BID. - continue Entresto and Labetalol - Discontinue Norvasc  - monitor vitals   HLD: Lipid panel: Chol 228, Trig 176, HDL 34, LDL 159. Home meds: simvastatin 40mg  qHS. Recommend high intensity statin given history of strokes and elevated lipid panel. - Transition to Rosuvastatin 40mg  qHS  T2DM with peripheral neuropathy: Last A1C: 7.1.  Home meds: Metformin 500mg  BID, Gabapentin 300mg  TID. Patient endorses some early satiety and postprandial nausea. Concern for gastroparesis. - hold home meds - sSSI - CBG with meals - Patient can discuss with PCP if desires treatment  H/o CVA: Last CVA in 2018. Some residual right hand weakness.  Neuro exam benign. Has baseline right facial droop and right hand weakness residual from previous stroke. Appears patient was on aspirin, but is not currently taking it.   Total hip Replacement (~2017)  Chronic back pani  s/p Lumbar Fusion: Home meds: Oxycodone 10mg  TID PRN, Tylenol 1000mg  q6 PRN, Tizanidine 2-4mg  PRN, and Gabapentin 300mg  TID - hold all home meds given syncopal episode of  unknown etiology  FEN/GI: Heart healthy/carb diet PPx: Lovenox  Disposition: pending work up and cards recs  Subjective:  Patient reports doing well this AM. Some postpradial fullness and nausea, otherwise denies any recurrent episodes. Denies any concerns or complaints.  Objective: Temp:  [97.5 F (36.4 C)-98.4 F (36.9 C)] 98.1 F (36.7 C) (05/19 0718) Pulse Rate:  [73-100] 100 (05/19 0720) Resp:  [12-19] 16 (05/19 0718) BP: (103-137)/(56-110) 107/76 (05/19 0720) SpO2:  [95 %-100 %] 95 % (05/19 0720) Weight:  [117.5 kg-118.5 kg] 117.5 kg (05/19 0352) Physical Exam: General: pleasent African-American male, well nourished, well developed, in no acute distress with non-toxic appearance, sitting comfortably in bedside chair HEENT: normocephalic, atraumatic, moist mucous membranes, oropharynx without erythema or exudate, uvula midline however soft palate dropped slightly on right likely 2/2 to previous stroke, PERRL, EOMI Neck: supple, normal ROM CV: regular rate and rhythm without murmurs, rubs, or gallops, no lower extremity edema, 2+ radial and pedal pulses bilaterally Lungs: clear to auscultation bilaterally with normal work of breathing Abdomen: soft, tender to LLQ, non-distended, normoactive bowel sounds, well healing incision line without signs of infection Skin: warm, dry Extremities: warm and well perfused, normal tone Neuro: Alert and oriented, speech normal, neuro exam benign, normal and equal strength and sensory   Laboratory: Recent Labs  Lab 04/18/19 1137 04/19/19 0507  WBC 11.7* 7.0  HGB 11.9* 11.6*  HCT 37.5* 35.7*  PLT 316 305   Recent Labs  Lab 04/18/19 1137 04/19/19 0507  NA 140 136  K 4.8 4.3  CL 105 102  CO2 23 23  BUN 37* 35*  CREATININE 2.54* 2.18*  CALCIUM 9.5 9.7  PROT 7.8  --   BILITOT 0.7  --   ALKPHOS 101  --   ALT 21  --   AST 17  --   GLUCOSE 159* 175*    Troponin <0.03 ISTAT Troponin: 0.00 PT/INR: 14.5/1.1 BNP: 6.9 CBG  162 COVID negative Iron 60, TIBC 307, Ferritin 169 Ethanol:<10 HIV: pending Lipid panel: Chol 228, Trig 176, HDL 34, LDL 159 TSH 0.432 UDS: negative  Imaging/Diagnostic Tests: Ct Abdomen Pelvis Wo Contrast  Result Date: 04/18/2019 CLINICAL DATA:  Recent oophorectomy on the left with pain, initial encounter EXAM: CT ABDOMEN AND PELVIS WITHOUT CONTRAST TECHNIQUE: Multidetector CT imaging of the abdomen and pelvis was performed following the standard protocol without IV contrast. COMPARISON:  Mild bibasilar atelectatic changes are noted. FINDINGS: Lower chest: No acute abnormality. Hepatobiliary: No focal liver abnormality is seen. No gallstones, gallbladder wall thickening, or biliary dilatation. Pancreas: Unremarkable. No pancreatic ductal dilatation or surrounding inflammatory changes. Spleen: Normal in size without focal abnormality. Adrenals/Urinary Tract: The adrenal glands are within normal limits. Postsurgical changes are noted on the left consistent with complete nephrectomy. No sizable hematoma or fluid collection is noted. No abscess is seen. The right kidney demonstrates no obstructive change. The bladder is partially distended. Stomach/Bowel: The appendix is within normal limits. No obstructive or inflammatory changes of the large or small bowel are seen. Vascular/Lymphatic: Aortic atherosclerosis. No enlarged abdominal or pelvic lymph nodes. Reproductive: Prostate is unremarkable. Other: No abdominal wall hernia or abnormality. No abdominopelvic ascites. Postsurgical changes are noted in the subcutaneous tissues consistent with the robotic ports. Musculoskeletal: Bilateral hip replacements are noted. Lumbar fusion is seen as well. No acute bony abnormality  is seen. IMPRESSION: Status post left nephrectomy. No postoperative hematoma or seroma is seen. Bibasilar atelectatic changes are seen. Electronically Signed   By: Inez Catalina M.D.   On: 04/18/2019 15:45   Ct Head Wo Contrast  Result  Date: 04/18/2019 CLINICAL DATA:  Syncope EXAM: CT HEAD WITHOUT CONTRAST TECHNIQUE: Contiguous axial images were obtained from the base of the skull through the vertex without intravenous contrast. COMPARISON:  Apr 18, 2019 study obtained earlier in the day; brain MRI February 02, 2017 FINDINGS: Brain: There is mild diffuse atrophy for age. There is no intracranial mass, hemorrhage, extra-axial fluid collection, or midline shift. There is patchy small vessel disease in the centra semiovale bilaterally, stable. There is evidence of a prior lacunar infarct involving the genu of the right internal capsule and immediately adjacent lateral anterior left thalamus. No acute infarct is demonstrable. There is benign calcification along the posterior left falx, stable. Vascular: There is no hyperdense vessel. There is calcification in each distal vertebral artery as well as in each carotid siphon region. Skull: The bony calvarium appears intact. Sinuses/Orbits: There is mild mucosal thickening in several anterior ethmoid air cells. Other visualized paranasal sinuses are clear. Orbits appear symmetric bilaterally. Other: Mastoid air cells are clear. IMPRESSION: 1. Mild diffuse atrophy for age. 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 Prior small infarct at the genu of the left internal capsule and adjacent anterolateral thalamus. 34 no acute infarct evident. No apparent mass or hemorrhage. Benign-appearing calcification noted adjacent to the left falx shows no surrounding edema or mass effect and is not felt to have clinical significance. 2.  Foci of arterial vascular calcification noted. 3.  Mild mucosal thickening several ethmoid air cells. Electronically Signed   By: Lowella Grip III M.D.   On: 04/18/2019 15:48    Danna Hefty, DO 04/19/2019, 2:42 PM PGY-1, [place] Intern pager: [phone], text pages welcome

## 2019-04-19 ENCOUNTER — Observation Stay (HOSPITAL_COMMUNITY): Payer: Medicaid Other

## 2019-04-19 ENCOUNTER — Observation Stay (HOSPITAL_BASED_OUTPATIENT_CLINIC_OR_DEPARTMENT_OTHER): Payer: Medicaid Other

## 2019-04-19 DIAGNOSIS — I110 Hypertensive heart disease with heart failure: Secondary | ICD-10-CM | POA: Diagnosis not present

## 2019-04-19 DIAGNOSIS — R55 Syncope and collapse: Secondary | ICD-10-CM | POA: Diagnosis not present

## 2019-04-19 DIAGNOSIS — Z905 Acquired absence of kidney: Secondary | ICD-10-CM | POA: Diagnosis not present

## 2019-04-19 DIAGNOSIS — Z1159 Encounter for screening for other viral diseases: Secondary | ICD-10-CM | POA: Diagnosis not present

## 2019-04-19 DIAGNOSIS — Z981 Arthrodesis status: Secondary | ICD-10-CM | POA: Diagnosis not present

## 2019-04-19 DIAGNOSIS — I10 Essential (primary) hypertension: Secondary | ICD-10-CM | POA: Diagnosis not present

## 2019-04-19 DIAGNOSIS — I428 Other cardiomyopathies: Secondary | ICD-10-CM

## 2019-04-19 DIAGNOSIS — N289 Disorder of kidney and ureter, unspecified: Secondary | ICD-10-CM | POA: Diagnosis not present

## 2019-04-19 DIAGNOSIS — I5042 Chronic combined systolic (congestive) and diastolic (congestive) heart failure: Secondary | ICD-10-CM

## 2019-04-19 DIAGNOSIS — F17210 Nicotine dependence, cigarettes, uncomplicated: Secondary | ICD-10-CM | POA: Diagnosis present

## 2019-04-19 DIAGNOSIS — I5022 Chronic systolic (congestive) heart failure: Secondary | ICD-10-CM | POA: Diagnosis not present

## 2019-04-19 DIAGNOSIS — E1143 Type 2 diabetes mellitus with diabetic autonomic (poly)neuropathy: Secondary | ICD-10-CM | POA: Diagnosis present

## 2019-04-19 DIAGNOSIS — H518 Other specified disorders of binocular movement: Secondary | ICD-10-CM | POA: Diagnosis present

## 2019-04-19 DIAGNOSIS — M25551 Pain in right hip: Secondary | ICD-10-CM | POA: Diagnosis not present

## 2019-04-19 DIAGNOSIS — N179 Acute kidney failure, unspecified: Secondary | ICD-10-CM | POA: Diagnosis not present

## 2019-04-19 DIAGNOSIS — R569 Unspecified convulsions: Secondary | ICD-10-CM | POA: Diagnosis present

## 2019-04-19 DIAGNOSIS — E861 Hypovolemia: Secondary | ICD-10-CM | POA: Diagnosis present

## 2019-04-19 DIAGNOSIS — E785 Hyperlipidemia, unspecified: Secondary | ICD-10-CM | POA: Diagnosis not present

## 2019-04-19 DIAGNOSIS — K3184 Gastroparesis: Secondary | ICD-10-CM | POA: Diagnosis present

## 2019-04-19 DIAGNOSIS — Z85528 Personal history of other malignant neoplasm of kidney: Secondary | ICD-10-CM | POA: Diagnosis not present

## 2019-04-19 DIAGNOSIS — J9811 Atelectasis: Secondary | ICD-10-CM | POA: Diagnosis not present

## 2019-04-19 DIAGNOSIS — D72829 Elevated white blood cell count, unspecified: Secondary | ICD-10-CM | POA: Diagnosis present

## 2019-04-19 DIAGNOSIS — Z79899 Other long term (current) drug therapy: Secondary | ICD-10-CM | POA: Diagnosis not present

## 2019-04-19 DIAGNOSIS — I5023 Acute on chronic systolic (congestive) heart failure: Secondary | ICD-10-CM | POA: Diagnosis not present

## 2019-04-19 DIAGNOSIS — I69351 Hemiplegia and hemiparesis following cerebral infarction affecting right dominant side: Secondary | ICD-10-CM | POA: Diagnosis not present

## 2019-04-19 DIAGNOSIS — D649 Anemia, unspecified: Secondary | ICD-10-CM | POA: Diagnosis not present

## 2019-04-19 DIAGNOSIS — I959 Hypotension, unspecified: Secondary | ICD-10-CM | POA: Diagnosis not present

## 2019-04-19 DIAGNOSIS — R112 Nausea with vomiting, unspecified: Secondary | ICD-10-CM | POA: Diagnosis present

## 2019-04-19 DIAGNOSIS — Z96643 Presence of artificial hip joint, bilateral: Secondary | ICD-10-CM | POA: Diagnosis present

## 2019-04-19 LAB — CBC
HCT: 35.7 % — ABNORMAL LOW (ref 39.0–52.0)
Hemoglobin: 11.6 g/dL — ABNORMAL LOW (ref 13.0–17.0)
MCH: 29.2 pg (ref 26.0–34.0)
MCHC: 32.5 g/dL (ref 30.0–36.0)
MCV: 89.9 fL (ref 80.0–100.0)
Platelets: 305 10*3/uL (ref 150–400)
RBC: 3.97 MIL/uL — ABNORMAL LOW (ref 4.22–5.81)
RDW: 13.7 % (ref 11.5–15.5)
WBC: 7 10*3/uL (ref 4.0–10.5)
nRBC: 0 % (ref 0.0–0.2)

## 2019-04-19 LAB — BASIC METABOLIC PANEL
Anion gap: 11 (ref 5–15)
BUN: 35 mg/dL — ABNORMAL HIGH (ref 6–20)
CO2: 23 mmol/L (ref 22–32)
Calcium: 9.7 mg/dL (ref 8.9–10.3)
Chloride: 102 mmol/L (ref 98–111)
Creatinine, Ser: 2.18 mg/dL — ABNORMAL HIGH (ref 0.61–1.24)
GFR calc Af Amer: 37 mL/min — ABNORMAL LOW (ref >60)
GFR calc non Af Amer: 32 mL/min — ABNORMAL LOW (ref >60)
Glucose, Bld: 175 mg/dL — ABNORMAL HIGH (ref 70–99)
Potassium: 4.3 mmol/L (ref 3.5–5.1)
Sodium: 136 mmol/L (ref 135–145)

## 2019-04-19 LAB — FERRITIN: Ferritin: 169 ng/mL (ref 24–336)

## 2019-04-19 LAB — GLUCOSE, CAPILLARY
Glucose-Capillary: 111 mg/dL — ABNORMAL HIGH (ref 70–99)
Glucose-Capillary: 132 mg/dL — ABNORMAL HIGH (ref 70–99)
Glucose-Capillary: 116 mg/dL — ABNORMAL HIGH (ref 70–99)
Glucose-Capillary: 108 mg/dL — ABNORMAL HIGH (ref 70–99)
Glucose-Capillary: 131 mg/dL — ABNORMAL HIGH (ref 70–99)

## 2019-04-19 LAB — ECHOCARDIOGRAM COMPLETE
Height: 78 in
Weight: 4144 oz

## 2019-04-19 LAB — IRON AND TIBC
Iron: 60 ug/dL (ref 45–182)
Saturation Ratios: 20 % (ref 17.9–39.5)
TIBC: 307 ug/dL (ref 250–450)
UIBC: 247 ug/dL

## 2019-04-19 LAB — HIV ANTIBODY (ROUTINE TESTING W REFLEX): HIV Screen 4th Generation wRfx: NONREACTIVE

## 2019-04-19 MED ORDER — TECHNETIUM TO 99M ALBUMIN AGGREGATED
1.5000 | Freq: Once | INTRAVENOUS | Status: AC | PRN
  Administered 2019-04-19: 1.5 via INTRAVENOUS

## 2019-04-19 MED ORDER — ROSUVASTATIN CALCIUM 20 MG PO TABS
40.0000 mg | ORAL_TABLET | Freq: Every day | ORAL | Status: DC
  Administered 2019-04-19: 40 mg via ORAL
  Filled 2019-04-19: qty 2

## 2019-04-19 NOTE — Discharge Summary (Signed)
[facility] Discharge Summary  Patient name: Raymond Barrera. Medical record number: [id number] Date of birth: 01-07-1961 Age: 58 y.o. Gender: male Date of Admission: 04/18/2019  Date of Discharge: 04/20/2019 Admitting Physician: Raymond Resides, MD  Primary Care Provider: Lucia Gaskins, MD Consultants: Cardiology  Indication for Hospitalization: syncope   Discharge Diagnoses/Problem List:  Syncope Heart failure with improved ejection fraction History of renal cell carcinoma Acute kidney injury Diabetes mellitus Hypertension Hyperlipidemia Normocytic anemia Peripheral neuropathy History of CVA  Disposition: Home  Discharge Condition: Stable  Discharge Exam: General: pleasent African-American male, well nourished, well developed, in no acute distress with non-toxic appearance, lying comfortably in bed HEENT: normocephalic, atraumatic, moist mucous membranes, oropharynx without erythema or exudate, uvula midline however soft palate dropped slightly on right likely 2/2 to previous stroke, PERRL, EOMI CV: regular rate and rhythm without murmurs, rubs, or gallops, no lower extremity edema, 2+ radial and pedal pulses bilaterally Lungs: clear to auscultation bilaterally with normal work of breathing Abdomen: soft, tender to LLQ, non-distended, normoactive bowel sounds, well healing incision line without signs of infection  Skin: warm, dry Extremities: warm and well perfused Neuro: Alert and oriented, speech normal  Brief Hospital Course:  Raymond Barrera. is a 58 y.o. male with past medical history significant for CHF, HTN, HLD, T2DM, acute CVA, JDJ, osteoarthritis, renal cell carcinoma s/p left nephrectomy in 03/30/19, who presented after experiencing two syncopal episodes involving diaphoresis, SOB, nausea, vomiting, and episodes of eye deviation and abnormal body movements. On admission patient was hemodynamically stable on room air and back to  baseline  Syncopal work up was unremarkable and included negative CT head and MRI head, UDS, and ethanol. EKG consistently with NSR without ST changes, TSH WNL. Echo revealed improved EF from 20-25% to 55-60%. V/Q scan negative for PE and EEG negative for seizure activity. Telemetry was evaluated without signs of arrhythmia. Cardiology was consulted and did not suspect syncope to be cardiac related. They recommended outpatient Holter monitor to assess for arrhythmia. At this time, syncopal episodes seem most likely 2/2 to hypotension as he was positive for orthostatics. His Norvasc and Entresto were both discontinued per cardiology recommendations. Of note, although EEG was negative, given this can not completely rule out seizure like activity, this is still a possible etiology and ambulatory referral to neurology was placed at time of discharge.  During admission, patient did not experience any further syncopal episodes. Patient as discharged in stable condition.   Patient was also noted to have an elevated creatinine during admission. It was difficult to determine if this was his new baseline kidney function after his left nephrectomy vs AKI. His kidney function did continue to improve during admission.   Issues for Follow Up:  1. Patient was transitioned to high intensity statin - Crestor 20mg  QD fors secondary prevention 2. Please continue to monitor kidney function to establish baseline creatinine  3. Recommend switching to Coreg from Labetalol for added benefit for HF 4. During admission he complained of symptoms consistent with gastroparesis. Recommend further evaluation if continues to be bothersome for patient. 5. Norvasc discontinued at discharge due to concern for hypotension as cause for syncope. Consider restarting at low dose if indicated  Significant Procedures: EEG, MRI, CT head, Echo  Significant Labs and Imaging:  Recent Labs  Lab 04/18/19 1137 04/19/19 0507 04/20/19 0503  WBC  11.7* 7.0 6.8  HGB 11.9* 11.6* 11.5*  HCT 37.5* 35.7* 35.3*  PLT 316 305 303  Recent Labs  Lab 04/18/19 1137 04/19/19 0507 04/20/19 0503  NA 140 136 137  K 4.8 4.3 4.4  CL 105 102 103  CO2 23 23 25   GLUCOSE 159* 175* 128*  BUN 37* 35* 30*  CREATININE 2.54* 2.18* 1.93*  CALCIUM 9.5 9.7 9.6  ALKPHOS 101  --   --   AST 17  --   --   ALT 21  --   --   ALBUMIN 3.7  --   --     Troponin <0.03 ISTAT Troponin: 0.00 PT/INR: 14.5/1.1 BNP: 6.9 CBG 162 COVID negative Iron 60, TIBC 307, Ferritin 169 Ethanol:<10 HIV: pending Lipid panel: Chol 228, Trig 176, HDL 34, LDL 159 TSH 0.432 UDS: negative  EEG: 04/19/2019 IMPRESSION: Normal electroencephalogram, awake, asleep and with activation procedures. There are no focal lateralizing or epileptiform features.  Echo: 04/19/19: IMPRESSIONS  1. The left ventricle has low normal systolic function, with an ejection fraction of 50-55%. The cavity size was normal. There is mild concentric left ventricular hypertrophy. Left ventricular diastolic Doppler parameters are consistent with  pseudonormalization.  2. The right ventricle has normal systolic function. The cavity was normal. There is no increase in right ventricular wall thickness.  3. Left atrial size was moderately dilated.  4. There is moderate mitral annular calcification present.  5. The aortic valve is tricuspid. Moderate thickening of the aortic valve. Moderate sclerosis of the aortic valve. Aortic valve regurgitation was not assessed by color flow Doppler.  6. There is mild dilatation at the level of the sinuses of Valsalva measuring 39 mm and the ascending aorta at 14mm.  Ct Abdomen Pelvis Wo Contrast  Result Date: 04/18/2019 CLINICAL DATA:  Recent oophorectomy on the left with pain, initial encounter EXAM: CT ABDOMEN AND PELVIS WITHOUT CONTRAST TECHNIQUE: Multidetector CT imaging of the abdomen and pelvis was performed following the standard protocol without IV contrast.  COMPARISON:  Mild bibasilar atelectatic changes are noted. FINDINGS: Lower chest: No acute abnormality. Hepatobiliary: No focal liver abnormality is seen. No gallstones, gallbladder wall thickening, or biliary dilatation. Pancreas: Unremarkable. No pancreatic ductal dilatation or surrounding inflammatory changes. Spleen: Normal in size without focal abnormality. Adrenals/Urinary Tract: The adrenal glands are within normal limits. Postsurgical changes are noted on the left consistent with complete nephrectomy. No sizable hematoma or fluid collection is noted. No abscess is seen. The right kidney demonstrates no obstructive change. The bladder is partially distended. Stomach/Bowel: The appendix is within normal limits. No obstructive or inflammatory changes of the large or small bowel are seen. Vascular/Lymphatic: Aortic atherosclerosis. No enlarged abdominal or pelvic lymph nodes. Reproductive: Prostate is unremarkable. Other: No abdominal wall hernia or abnormality. No abdominopelvic ascites. Postsurgical changes are noted in the subcutaneous tissues consistent with the robotic ports. Musculoskeletal: Bilateral hip replacements are noted. Lumbar fusion is seen as well. No acute bony abnormality is seen. IMPRESSION: Status post left nephrectomy. No postoperative hematoma or seroma is seen. Bibasilar atelectatic changes are seen. Electronically Signed   By: Inez Catalina M.D.   On: 04/18/2019 15:45   Dg Chest 2 View  Result Date: 04/19/2019 CLINICAL DATA:  Syncopal episode EXAM: CHEST - 2 VIEW COMPARISON:  04/18/2019, 03/30/2019, 07/14/2018 FINDINGS: Chronic elevation of left diaphragm with adjacent atelectasis or scar. No pleural effusion. Mild cardiomegaly. No pneumothorax. IMPRESSION: No active cardiopulmonary disease. Stable elevation of left diaphragm with atelectasis or scarring at left base. Mild cardiomegaly Electronically Signed   By: Donavan Foil M.D.   On: 04/19/2019 21:06  Ct Head Wo  Contrast  Result Date: 04/18/2019 CLINICAL DATA:  Syncope EXAM: CT HEAD WITHOUT CONTRAST TECHNIQUE: Contiguous axial images were obtained from the base of the skull through the vertex without intravenous contrast. COMPARISON:  Apr 18, 2019 study obtained earlier in the day; brain MRI February 02, 2017 FINDINGS: Brain: There is mild diffuse atrophy for age. There is no intracranial mass, hemorrhage, extra-axial fluid collection, or midline shift. There is patchy small vessel disease in the centra semiovale bilaterally, stable. There is evidence of a prior lacunar infarct involving the genu of the right internal capsule and immediately adjacent lateral anterior left thalamus. No acute infarct is demonstrable. There is benign calcification along the posterior left falx, stable. Vascular: There is no hyperdense vessel. There is calcification in each distal vertebral artery as well as in each carotid siphon region. Skull: The bony calvarium appears intact. Sinuses/Orbits: There is mild mucosal thickening in several anterior ethmoid air cells. Other visualized paranasal sinuses are clear. Orbits appear symmetric bilaterally. Other: Mastoid air cells are clear. IMPRESSION: 1. Mild diffuse atrophy for age. 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 Prior small infarct at the genu of the left internal capsule and adjacent anterolateral thalamus. 34 no acute infarct evident. No apparent mass or hemorrhage. Benign-appearing calcification noted adjacent to the left falx shows no surrounding edema or mass effect and is not felt to have clinical significance. 2.  Foci of arterial vascular calcification noted. 3.  Mild mucosal thickening several ethmoid air cells. Electronically Signed   By: Lowella Grip III M.D.   On: 04/18/2019 15:48   Mr Brain Wo Contrast  Result Date: 04/19/2019 CLINICAL DATA:  Initial evaluation for altered mental status, syncopal episodes. EXAM: MRI HEAD WITHOUT CONTRAST TECHNIQUE:  Multiplanar, multiecho pulse sequences of the brain and surrounding structures were obtained without intravenous contrast. COMPARISON:  Prior CT from 04/18/2019. FINDINGS: Brain: Generalized age-related cerebral atrophy. Patchy and confluent T2/FLAIR hyperintensity within the periventricular deep white matter both cerebral hemispheres most consistent with chronic small vessel ischemic disease, moderate nature. Superimposed remote lacunar infarcts present within the bilateral basal ganglia. Associated wallerian degeneration on the left. No abnormal foci of restricted diffusion to suggest acute or subacute ischemia. Gray-white matter differentiation maintained. No encephalomalacia to suggest chronic cortical infarction. No foci of susceptibility artifact to suggest acute or chronic intracranial hemorrhage. No mass lesion, midline shift or mass effect. No hydrocephalus. No extra-axial fluid collection. No intrinsic temporal lobe abnormality. Pituitary gland suprasellar region normal. Midline structures intact. Vascular: Major intracranial vascular flow voids are well maintained. Skull and upper cervical spine: Craniocervical junction normal. Upper cervical spine within normal limits. Bone marrow signal intensity normal. No scalp soft tissue abnormality. Sinuses/Orbits: Globes and orbital soft tissues within normal limits. Mild scattered mucosal thickening throughout the paranasal sinuses. No air-fluid levels to suggest acute sinusitis. Mastoid air cells are clear. Inner ear structures grossly normal. Other: None. IMPRESSION: 1. No acute intracranial abnormality or findings to explain patient's symptoms identified. 2. Moderate chronic microvascular ischemic disease with superimposed remote lacunar infarcts involving the bilateral basal ganglia. Electronically Signed   By: Jeannine Boga M.D.   On: 04/19/2019 14:56   Nm Pulmonary Perfusion  Result Date: 04/19/2019 CLINICAL DATA:  Shortness of breath and syncope  EXAM: NUCLEAR MEDICINE PERFUSION LUNG SCAN TECHNIQUE: Perfusion images were obtained in multiple projections after intravenous injection of radiopharmaceutical. Ventilation scans intentionally deferred if perfusion scan and chest x-ray adequate for interpretation during COVID 19 epidemic. RADIOPHARMACEUTICALS:  1.7 mCi  Tc-82m MAA IV COMPARISON:  Chest radiograph Apr 18, 2019 FINDINGS: Radiotracer uptake is homogeneous and symmetric bilaterally. No focal perfusion defects are identified. There is cardiomegaly. IMPRESSION: Perfusion defects evident. This study constitutes a very low probability of pulmonary embolus. There is cardiomegaly. Electronically Signed   By: Lowella Grip III M.D.   On: 04/19/2019 15:15   Dg Chest Port 1 View  Result Date: 04/18/2019 CLINICAL DATA:  Chest pain.  Syncopal episode. EXAM: PORTABLE CHEST 1 VIEW COMPARISON:  Radiographs 04/18/2019, 03/30/2019 and 01/14/2019. FINDINGS: 1156 hours. Stable chronic elevation of the left hemidiaphragm with associated chronic left basilar atelectasis or scarring. There is no confluent airspace opacity, edema, pleural effusion or pneumothorax. Mild enlargement of the cardiac silhouette is stable. No acute osseous findings. IMPRESSION: Stable chronic left basilar atelectasis or scarring. No acute findings. Electronically Signed   By: Richardean Sale M.D.   On: 04/18/2019 12:21    Results/Tests Pending at Time of Discharge: None  Discharge Medications:  Allergies as of 04/20/2019      Reactions   Poison Ivy Extract [poison Ivy Extract] Itching, Rash      Medication List    STOP taking these medications   amLODipine 10 MG tablet Commonly known as:  NORVASC   diazepam 5 MG tablet Commonly known as:  Valium   sacubitril-valsartan 24-26 MG Commonly known as:  Entresto   simvastatin 40 MG tablet Commonly known as:  ZOCOR     TAKE these medications   acetaminophen 500 MG tablet Commonly known as:  TYLENOL Take 1,000 mg by  mouth every 6 (six) hours as needed (for pain).   aspirin EC 81 MG tablet Take 81 mg by mouth daily.   docusate sodium 100 MG capsule Commonly known as:  COLACE Take 1 capsule (100 mg total) by mouth 2 (two) times daily.   gabapentin 300 MG capsule Commonly known as:  NEURONTIN Take 300 mg by mouth 3 (three) times daily.   glucose 4 GM chewable tablet Chew 1 tablet by mouth as needed for low blood sugar.   dextrose 40 % Gel Commonly known as:  GLUTOSE Take 1 Tube by mouth once as needed for low blood sugar.   labetalol 200 MG tablet Commonly known as:  NORMODYNE Take 200 mg by mouth 2 (two) times daily.   metFORMIN 500 MG tablet Commonly known as:  GLUCOPHAGE Take 500 mg by mouth 2 (two) times daily with a meal.   ondansetron 4 MG tablet Commonly known as:  Zofran Take 1 tablet (4 mg total) by mouth every 8 (eight) hours as needed for nausea or vomiting.   Oxycodone HCl 10 MG Tabs Take 1 tablet (10 mg total) by mouth 3 (three) times daily as needed (pain.). What changed:  reasons to take this   rosuvastatin 20 MG tablet Commonly known as:  CRESTOR Take 1 tablet (20 mg total) by mouth at bedtime.   tiZANidine 4 MG tablet Commonly known as:  ZANAFLEX Take 2-4 mg by mouth 2 (two) times daily as needed for muscle spasms.       Discharge Instructions: Please refer to Patient Instructions section of EMR for full details.  Patient was counseled important signs and symptoms that should prompt return to medical care, changes in medications, dietary instructions, activity restrictions, and follow up appointments.   Follow-Up Appointments: Follow-up Information    Croitoru, Mihai, MD Follow up.   Specialty:  Cardiology Why:  our office will contact you regarding heart monitor pickup. Dr.  Croitoru has also recommended that you get repeat lab work at the end of June and follow up with him in July. Our office will call to arrange theses appointments.  Contact  information: [address] [place] [address] [phone]        Raymond Gaskins, MD. Schedule an appointment as soon as possible for a visit in 1 week(s).   Specialty:  Internal Medicine Why:  for hospital follow up Contact information: [place] [address] [place], [place], DO 04/20/2019, 1:45 PM PGY-1, [place]

## 2019-04-19 NOTE — Consult Note (Signed)
Cardiology Consultation:   Patient ID: Raymond Barrera.; [id number]; 12/09/1960   Admit date: 04/18/2019 Date of Consult: 04/19/2019  Primary Care Provider: Lucia Gaskins, MD Primary Cardiologist: Dr. Sallyanne Kuster, MD   Patient Profile:   Raymond Barrera. is a 58 y.o. male with a hx significant for hypertension, hyperlipidemia, diabetes mellitus, CVA 03/4561, chronic systolic heart failure with a EF as low as 20 to 25% on 03/6388, chronic diastolic dysfunction and recent left nephrectomy in the setting of RCC who is being seen today for the evaluation of syncope at the request of Dr. Kris Mouton.  History of Present Illness:   Raymond Barrera is a 59 year old male with a history as stated above who presented to [facility] on 04/18/2019 after a witnessed syncopal episodes which occurred in his car (passenger) on his way to an orthopedic appoitment. Pt reports that he woke yesterday morning and felt mildly nauseated. He went to his kitchen and ate breakfast, but was still not feeling great. He then went to sit in his recliner to sit for a few minutes. After getting up he began to feel dizzy, needing to lean on the wall. He noticed that he was becoming more diaphoretic. They proceed to go to the car, as he was on his way to a doctors appointment when he states he began to black out. Per patient report, he states that his wife noticed his eyes rolling back as well as usual body movements. He was initially taken to [facility] where he presented as a possible STEMI. On call cardiology fellow accepted transfer to [facility] and subsequently the STEMI was cancelled after EKG review.   Prior to this event, he was in his usual state of health. He recently underwent a left nephrectomy for RCC in which he was seen by our service for pre-surgical clearance in the setting of non-ischemic cardiomyopathy and HTN. This was conducted via virtual visit on  03/07/2019. Lab work collected on 10/24/2018 was reviewed and within  normal limits. He was not anemic and had normal renal function. He is maintained on Norvasc, labetalol, Entresto, and Zocor. He was noted to be mildly hypertensive during that visit but reported that he generally would run with SBP in the 130-135 range. Overall he was noted to be doing well without specific cardiac complaints.  An echocardiogram performed 06/15/2018 showed an EF of 20 to 25%, severe diffuse hypokinesis but no regional wall motion abnormality, grade 1 diastolic dysfunction, and severely dilated left atrium. Nuclear stress test at the same time revealed low EF but no reversible ischemia.   While in the ED, EKG showed NSR and evidence of LVH with no acute ischemic changes. Vitals were stable on ED arrival and patient was alert and oriented. BNP was WNL and CXR negative for acute abnormalities. Troponin was negative at 0.00. He denied any episode of chest pain, palpitations, LE swelling, or orthopnea symptoms. As previously stated, he had nausea, vomiting and diaphoresis as well as dizziness. It is unclear if he ever truly lost consciousness. He did have some SOB with his acute symptoms that have since resolved. Creatinine was elevated at 2.18 with a baseline of 1.3-1.4, likely in the setting of recent left nephrectomy secondary to [place] with a possible component of dehydration. Head CT was performed without acute findings. He has not had a recurrent episode since hospital arrival. He reports one prior episode of "syncope" in the past that was secondary to hyperglycemia and was not similar to the most  recent event. He reports medication compliance and denies alcohol or illicit drug use.   An echocardiogram performed 04/19/2019 showed improved LVEF to 50-55%. There was mild concentric LVH. There was moderate mitral calcification with moderate sclerosis of the tricuspid aortic valve. There is mild dilatation at the level of the sinuses of Valsalva measuring 39 mm and the ascending aorta at 53mm  Past  Medical History:  Diagnosis Date   Anxiety    due to the stroke   Arthritis    Back pain    hx of buldging disc   Diabetes mellitus without complication (HCC)    takes Metformin daily   High cholesterol    takes Zocor daily   Hypertension    takes Benazepril and HCTZ  daily   Joint pain    Joint swelling    Memory impairment    occassional - from stroke   Myocardial infarction ([place]) 1987   Pneumonia    hx of-80's   PONV (postoperative nausea and vomiting)    took a while for him to wake up after previous anesthesia   Shortness of breath dyspnea    do to pain   Sleep apnea    never had a sleep study,but states Dr. Cindie Laroche says he has it   Slurred speech    Stroke ([place]) 08/2013   7 mini-strokes, last stroke 2017   TIA (transient ischemic attack) 2014   x 7    Urinary frequency     Past Surgical History:  Procedure Laterality Date   ABDOMINAL EXPOSURE N/A 06/16/2018   Procedure: ABDOMINAL EXPOSURE;  Surgeon: Rosetta Posner, MD;  Location: [place];  Service: Vascular;  Laterality: N/A;   ANKLE SURGERY  2008   left ankle-otif-[facility]   ANTERIOR LUMBAR FUSION Bilateral 06/16/2018   Procedure: LUMBAR 4-5 LUMBAR 5-SACRUM 1 ANTERIOR LUMBAR INTERBODY FUSION WITH INSTRUMENTATION AND ALLOGRAFT;  Surgeon: Phylliss Bob, MD;  Location: [place];  Service: Orthopedics;  Laterality: Bilateral;   BACK SURGERY     HEMATOMA EVACUATION Left 08/13/2018   Procedure: EVACUATION HEMATOMA LEFT ABDOMINAL WALL;  Surgeon: Rosetta Posner, MD;  Location: [facility];  Service: Vascular;  Laterality: Left;   IR RADIOLOGIST EVAL & MGMT  07/27/2018   IR US GUIDE BX ASP/DRAIN  07/14/2018   JOINT REPLACEMENT     both hips replaced    LUMBAR LAMINECTOMY/DECOMPRESSION MICRODISCECTOMY Right 10/17/2014   Procedure: LUMBAR LAMINECTOMY/DECOMPRESSION MICRODISCECTOMY 2 LEVELS;  Surgeon: [name], MD;  Location: [place] [facility];  Service: Neurosurgery;  Laterality: Right;  Right L45 L5S1  laminectomy and foraminotomy   MASS EXCISION  09/13/2012   Procedure: EXCISION MASS;  Surgeon: Jamesetta So, MD;  Location: [facility];  Service: General;  Laterality: N/A;   ROBOTIC ASSITED PARTIAL NEPHRECTOMY Left 03/30/2019   Procedure: XI ROBOTIC ASSITED PARTIAL NEPHRECTOMY POSSIBLE RADICAL NEPHRECTOMY;  Surgeon: Ceasar Mons, MD;  Location: [facility];  Service: Urology;  Laterality: Left;   TONSILLECTOMY     TOTAL HIP ARTHROPLASTY Right 03/20/2015   TOTAL HIP ARTHROPLASTY Right 03/20/2015   Procedure: TOTAL HIP ARTHROPLASTY ANTERIOR APPROACH;  Surgeon: Renette Butters, MD;  Location: [place];  Service: Orthopedics;  Laterality: Right;   TOTAL HIP ARTHROPLASTY Left 01/08/2016   Procedure: TOTAL HIP ARTHROPLASTY ANTERIOR APPROACH;  Surgeon: Renette Butters, MD;  Location: [place];  Service: Orthopedics;  Laterality: Left;     Prior to Admission medications   Medication Sig Start Date End Date Taking? Authorizing Provider  acetaminophen (TYLENOL)  500 MG tablet Take 1,000 mg by mouth every 6 (six) hours as needed (for pain).   Yes [provider]  amLODipine (NORVASC) 10 MG tablet Take 10 mg by mouth daily.   Yes [provider]  dextrose (GLUTOSE) 40 % GEL Take 1 Tube by mouth once as needed for low blood sugar.   Yes [provider]  docusate sodium (COLACE) 100 MG capsule Take 1 capsule (100 mg total) by mouth 2 (two) times daily. 03/30/19  Yes Dancy, Amanda, PA-C  gabapentin (NEURONTIN) 300 MG capsule Take 300 mg by mouth 3 (three) times daily.   Yes [provider]  glucose 4 GM chewable tablet Chew 1 tablet by mouth as needed for low blood sugar.   Yes [provider]  labetalol (NORMODYNE) 200 MG tablet Take 200 mg by mouth 2 (two) times daily.   Yes [provider]  metFORMIN (GLUCOPHAGE) 500 MG tablet Take 500 mg by mouth 2 (two) times daily with a meal.   Yes [provider]  ondansetron (ZOFRAN) 4 MG tablet Take  1 tablet (4 mg total) by mouth every 8 (eight) hours as needed for nausea or vomiting. 03/30/19  Yes Dancy, Estill Bamberg, PA-C  Oxycodone HCl 10 MG TABS Take 1 tablet (10 mg total) by mouth 3 (three) times daily as needed (pain.). Patient taking differently: Take 10 mg by mouth 3 (three) times daily as needed (for pain).  03/30/19  Yes Dancy, Amanda, PA-C  sacubitril-valsartan (ENTRESTO) 24-26 MG Take 1 tablet by mouth 2 (two) times daily. 03/10/19  Yes Croitoru, Mihai, MD  simvastatin (ZOCOR) 40 MG tablet Take 40 mg by mouth at bedtime.    Yes [provider]  tiZANidine (ZANAFLEX) 4 MG tablet Take 2-4 mg by mouth 2 (two) times daily as needed for muscle spasms.  02/22/19  Yes [provider]  aspirin EC 81 MG tablet Take 81 mg by mouth daily.    [provider]  diazepam (VALIUM) 5 MG tablet 1 po tid for spasm pain Patient not taking: Reported on 04/18/2019 10/15/18   Lily Kocher, PA-C    Inpatient Medications: Scheduled Meds:  amLODipine  10 mg Oral Daily   atorvastatin  20 mg Oral QHS   docusate sodium  100 mg Oral BID   enoxaparin (LOVENOX) injection  40 mg Subcutaneous Q24H   insulin aspart  0-9 Units Subcutaneous TID WC   labetalol  200 mg Oral BID   sacubitril-valsartan  1 tablet Oral BID   Continuous Infusions:  PRN Meds: acetaminophen **OR** acetaminophen, ondansetron  Allergies:    Allergies  Allergen Reactions   Poison Ivy Extract [Poison Ivy Extract] Itching and Rash    Social History:   Social History   Socioeconomic History   Marital status: Married    Spouse name: Not on file   Number of children: Not on file   Years of education: Not on file   Highest education level: Not on file  Occupational History   Not on file  Social Needs   Financial resource strain: Not on file   Food insecurity:    Worry: Not on file    Inability: Not on file   Transportation needs:    Medical: Not on file    Non-medical: Not on file    Tobacco Use   Smoking status: Current Some Day Smoker    Packs/day: 0.25    Years: 47.00    Pack years: 11.75    Types: Cigarettes  Smokeless tobacco: Never Used   Tobacco comment: 2 cigarettes  Substance and Sexual Activity   Alcohol use: No    Comment: quit 2012   Drug use: Not Currently    Types: Cocaine    Comment: many yrs ago., last time- late 2016   Sexual activity: Never  Lifestyle   Physical activity:    Days per week: Not on file    Minutes per session: Not on file   Stress: Not on file  Relationships   Social connections:    Talks on phone: Not on file    Gets together: Not on file    Attends religious service: Not on file    Active member of club or organization: Not on file    Attends meetings of clubs or organizations: Not on file    Relationship status: Not on file   Intimate partner violence:    Fear of current or ex partner: Not on file    Emotionally abused: Not on file    Physically abused: Not on file    Forced sexual activity: Not on file  Other Topics Concern   Not on file  Social History Narrative   Not on file    Family History:   Family History  Problem Relation Age of Onset   Heart disease Father    Family Status:  Family Status  Relation Name Status   Father  Deceased   Mother  Alive   ROS:  Please see the history of present illness.  All other ROS reviewed and negative.     Physical Exam/Data:   Vitals:   04/19/19 0352 04/19/19 0716 04/19/19 0718 04/19/19 0720  BP: 120/80 126/88 109/69 107/76  Pulse: 77 78 79 100  Resp: 18 15 16    Temp: 98 F (36.7 C) 98.4 F (36.9 C) 98.1 F (36.7 C)   TempSrc: Oral Oral Oral   SpO2: 96% 96% 95% 95%  Weight: 117.5 kg     Height:        Intake/Output Summary (Last 24 hours) at 04/19/2019 1005 Last data filed at 04/19/2019 0912 Gross per 24 hour  Intake 1010 ml  Output 400 ml  Net 610 ml   Filed Weights   04/18/19 1132 04/18/19 1711 04/19/19 0352  Weight: 117.9  kg 118.5 kg 117.5 kg   Body mass index is 29.93 kg/m.   General:  Well nourished, well developed, in no acute distress HEENT: normal Lymph: no adenopathy Neck: no JVD Endocrine:  No thryomegaly Vascular: No carotid bruits; 4/4 extremity pulses 2+, without bruits  Cardiac:  normal S1, S2; RRR; no murmur  Lungs:  clear to auscultation bilaterally, no wheezing, rhonchi or rales  Abd: soft, nontender, no hepatomegaly  Ext: no edema Musculoskeletal:  No deformities, BUE and BLE strength normal and equal Skin: warm and dry  Neuro:  CNs 2-12 intact, no focal abnormalities noted Psych:  Normal affect   EKG:  The EK/G was personally reviewed and demonstrates: 04/18/2019 NSR with evidence of LVH and no acute changes Telemetry:  Telemetry was personally reviewed and demonstrates:  sinus  Relevant CV Studies:  Echocardiogram 04/19/2019:   1. The left ventricle has low normal systolic function, with an ejection fraction of 50-55%. The cavity size was normal. There is mild concentric left ventricular hypertrophy. Left ventricular diastolic Doppler parameters are consistent with  pseudonormalization.  2. The right ventricle has normal systolic function. The cavity was normal. There is no increase in right ventricular wall thickness.  3. Left atrial size was moderately dilated.  4. There is moderate mitral annular calcification present.  5. The aortic valve is tricuspid. Moderate thickening of the aortic valve. Moderate sclerosis of the aortic valve. Aortic valve regurgitation was not assessed by color flow Doppler.  6. There is mild dilatation at the level of the sinuses of Valsalva measuring 39 mm and the ascending aorta at 22mm.  Lexiscan Myoview 06/15/2018:  Nuclear stress EF: 24%.  There was no ST segment deviation noted during stress.  No T wave inversion was noted during stress.  This is a high risk study.  The left ventricular ejection fraction is severely decreased  (<30%).  High risk nuclear study due to severe dilated cardiomyopathy, LVEF<30%. There is no reversible ischemia.  Echo 06/15/2018: Study Conclusions  - Left ventricle: The cavity size was mildly dilated. There was moderate concentric hypertrophy. Systolic function was severely reduced. The estimated ejection fraction was in the range of 20% to 25%. Severe diffuse hypokinesis with no identifiable regional variations. Doppler parameters are consistent with abnormal left ventricular relaxation (grade 1 diastolic dysfunction). Acoustic contrast opacification revealed no evidence ofthrombus. - Left atrium: The atrium was severely dilated. - Direct comparison with images from March 2018 shows little change in left ventricular function.  Laboratory Data:  Chemistry Recent Labs  Lab 04/18/19 1137 04/19/19 0507  NA 140 136  K 4.8 4.3  CL 105 102  CO2 23 23  GLUCOSE 159* 175*  BUN 37* 35*  CREATININE 2.54* 2.18*  CALCIUM 9.5 9.7  GFRNONAA 26* 32*  GFRAA 31* 37*  ANIONGAP 12 11    Total Protein  Date Value Ref Range Status  04/18/2019 7.8 6.5 - 8.1 g/dL Final   Albumin  Date Value Ref Range Status  04/18/2019 3.7 3.5 - 5.0 g/dL Final   AST  Date Value Ref Range Status  04/18/2019 17 15 - 41 U/L Final   ALT  Date Value Ref Range Status  04/18/2019 21 0 - 44 U/L Final   Alkaline Phosphatase  Date Value Ref Range Status  04/18/2019 101 38 - 126 U/L Final   Total Bilirubin  Date Value Ref Range Status  04/18/2019 0.7 0.3 - 1.2 mg/dL Final   Hematology Recent Labs  Lab 04/18/19 1137 04/19/19 0507  WBC 11.7* 7.0  RBC 4.11* 3.97*  HGB 11.9* 11.6*  HCT 37.5* 35.7*  MCV 91.2 89.9  MCH 29.0 29.2  MCHC 31.7 32.5  RDW 13.7 13.7  PLT 316 305   Cardiac Enzymes Recent Labs  Lab 04/18/19 1137  TROPONINI <0.03    Recent Labs  Lab 04/18/19 1145  TROPIPOC 0.00    BNP Recent Labs  Lab 04/18/19 1138  BNP 6.9    DDimer No results for  input(s): DDIMER in the last 168 hours. TSH:  Lab Results  Component Value Date   TSH 0.432 04/18/2019   Lipids: Lab Results  Component Value Date   CHOL 228 (H) 04/18/2019   HDL 34 (L) 04/18/2019   LDLCALC 159 (H) 04/18/2019   TRIG 176 (H) 04/18/2019   CHOLHDL 6.7 04/18/2019   HgbA1c: Lab Results  Component Value Date   HGBA1C 7.1 (H) 03/22/2019   Radiology/Studies:  Ct Abdomen Pelvis Wo Contrast  Result Date: 04/18/2019 CLINICAL DATA:  Recent oophorectomy on the left with pain, initial encounter EXAM: CT ABDOMEN AND PELVIS WITHOUT CONTRAST TECHNIQUE: Multidetector CT imaging of the abdomen and pelvis was performed following the standard protocol without IV contrast. COMPARISON:  Mild  bibasilar atelectatic changes are noted. FINDINGS: Lower chest: No acute abnormality. Hepatobiliary: No focal liver abnormality is seen. No gallstones, gallbladder wall thickening, or biliary dilatation. Pancreas: Unremarkable. No pancreatic ductal dilatation or surrounding inflammatory changes. Spleen: Normal in size without focal abnormality. Adrenals/Urinary Tract: The adrenal glands are within normal limits. Postsurgical changes are noted on the left consistent with complete nephrectomy. No sizable hematoma or fluid collection is noted. No abscess is seen. The right kidney demonstrates no obstructive change. The bladder is partially distended. Stomach/Bowel: The appendix is within normal limits. No obstructive or inflammatory changes of the large or small bowel are seen. Vascular/Lymphatic: Aortic atherosclerosis. No enlarged abdominal or pelvic lymph nodes. Reproductive: Prostate is unremarkable. Other: No abdominal wall hernia or abnormality. No abdominopelvic ascites. Postsurgical changes are noted in the subcutaneous tissues consistent with the robotic ports. Musculoskeletal: Bilateral hip replacements are noted. Lumbar fusion is seen as well. No acute bony abnormality is seen. IMPRESSION: Status post  left nephrectomy. No postoperative hematoma or seroma is seen. Bibasilar atelectatic changes are seen. Electronically Signed   By: Inez Catalina M.D.   On: 04/18/2019 15:45   Ct Head Wo Contrast  Result Date: 04/18/2019 CLINICAL DATA:  Syncope EXAM: CT HEAD WITHOUT CONTRAST TECHNIQUE: Contiguous axial images were obtained from the base of the skull through the vertex without intravenous contrast. COMPARISON:  Apr 18, 2019 study obtained earlier in the day; brain MRI February 02, 2017 FINDINGS: Brain: There is mild diffuse atrophy for age. There is no intracranial mass, hemorrhage, extra-axial fluid collection, or midline shift. There is patchy small vessel disease in the centra semiovale bilaterally, stable. There is evidence of a prior lacunar infarct involving the genu of the right internal capsule and immediately adjacent lateral anterior left thalamus. No acute infarct is demonstrable. There is benign calcification along the posterior left falx, stable. Vascular: There is no hyperdense vessel. There is calcification in each distal vertebral artery as well as in each carotid siphon region. Skull: The bony calvarium appears intact. Sinuses/Orbits: There is mild mucosal thickening in several anterior ethmoid air cells. Other visualized paranasal sinuses are clear. Orbits appear symmetric bilaterally. Other: Mastoid air cells are clear. IMPRESSION: 1. Mild diffuse atrophy for age. 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 Prior small infarct at the genu of the left internal capsule and adjacent anterolateral thalamus. 34 no acute infarct evident. No apparent mass or hemorrhage. Benign-appearing calcification noted adjacent to the left falx shows no surrounding edema or mass effect and is not felt to have clinical significance. 2.  Foci of arterial vascular calcification noted. 3.  Mild mucosal thickening several ethmoid air cells. Electronically Signed   By: Lowella Grip III M.D.   On:  04/18/2019 15:48   US Venous Img Lower Unilateral Left  Result Date: 04/16/2019 CLINICAL DATA:  Left lower extremity pain and edema for the past 2 weeks. History of left hip replacement. EXAM: LEFT LOWER EXTREMITY VENOUS DOPPLER ULTRASOUND TECHNIQUE: Gray-scale sonography with graded compression, as well as color Doppler and duplex ultrasound were performed to evaluate the lower extremity deep venous systems from the level of the common femoral vein and including the common femoral, femoral, profunda femoral, popliteal and calf veins including the posterior tibial, peroneal and gastrocnemius veins when visible. The superficial great saphenous vein was also interrogated. Spectral Doppler was utilized to evaluate flow at rest and with distal augmentation maneuvers in the common femoral, femoral and popliteal veins. COMPARISON:  None. FINDINGS: Contralateral Common Femoral Vein: Respiratory  phasicity is normal and symmetric with the symptomatic side. No evidence of thrombus. Normal compressibility. Common Femoral Vein: No evidence of thrombus. Normal compressibility, respiratory phasicity and response to augmentation. Saphenofemoral Junction: No evidence of thrombus. Normal compressibility and flow on color Doppler imaging. Profunda Femoral Vein: No evidence of thrombus. Normal compressibility and flow on color Doppler imaging. Femoral Vein: No evidence of thrombus. Normal compressibility, respiratory phasicity and response to augmentation. Popliteal Vein: No evidence of thrombus. Normal compressibility, respiratory phasicity and response to augmentation. Calf Veins: No evidence of thrombus. Normal compressibility and flow on color Doppler imaging. Superficial Great Saphenous Vein: No evidence of thrombus. Normal compressibility. Venous Reflux:  None. Other Findings: There is mixed echogenic occlusive thrombus within a hypertrophied varicosity within the left popliteal fossa (images 31 through 37). A portion of the  thrombus appears echogenic potentially indicative of calcification and thus a chronic etiology. IMPRESSION: 1. No evidence of DVT within the left lower extremity. 2. Examination is positive for occlusive superficial thrombophlebitis involving a prominent varicosity within the left popliteal fossa. While age-indeterminate, a portion of the thrombus appears echogenic, indicative of calcification, and thus favoring a chronic etiology. Clinical correlation is advised. Electronically Signed   By: Sandi Mariscal M.D.   On: 04/16/2019 12:26   Dg Chest Port 1 View  Result Date: 04/18/2019 CLINICAL DATA:  Chest pain.  Syncopal episode. EXAM: PORTABLE CHEST 1 VIEW COMPARISON:  Radiographs 04/18/2019, 03/30/2019 and 01/14/2019. FINDINGS: 1156 hours. Stable chronic elevation of the left hemidiaphragm with associated chronic left basilar atelectasis or scarring. There is no confluent airspace opacity, edema, pleural effusion or pneumothorax. Mild enlargement of the cardiac silhouette is stable. No acute osseous findings. IMPRESSION: Stable chronic left basilar atelectasis or scarring. No acute findings. Electronically Signed   By: Richardean Sale M.D.   On: 04/18/2019 12:21   Assessment and Plan:   1.  Syncope: -Pt presented after a witnessed syncopal episode which occurred in his car (passenger) on his way to an orthopedic appointment 04/18/2019. He woke yesterday morning and felt mildly nauseated and proceeded to his kitchen to eat breakfast, but was still not feeling great. He then went to sit in his recliner to sit for a few minutes. After getting up he began to feel dizzy, needing to lean on the wall. He noticed that he was becoming more diaphoretic. His wife asked him to get into the car, as they were on their way to an MD appointment. Per patient report, he states that his wife noticed his eyes rolling back as well as usual body movements and he was blacking out.  -Initially he was taken to [facility] where he presented as  a possible STEMI. On call [facility] cardiology fellow accepted transfer and subsequently STEMI was cancelled after EKG review. -VSS on ED arrival with essentially normal lab workup -Creatinine elevated in the setting of recent left renal nephrectomy and possible dehydration -No arrhthymias on EKG>>awating tele review per rounding MD -Head CT negative for acute changes -Repeat echocardiogram with improved LVEF to 50-55%here was mild concentric LVH. There was moderate mitral calcification with moderate sclerosis of the tricuspid aortic valve. There is mild dilatation at the level of the sinuses of Valsalva measuring 39 mm and the ascending aorta at 74mm -Mitral valve regurgitation was not assessed by doppler -Difficult to determine etiology as he has multiple risk factors>>recent stress test 05/2018 with no ischemic changes and echo performed at the same time with severe LV dysfunction, noted to be 20-25% at that  time. He has been on Entresto 24-26  -On home labetalol for HTN and cardiomyopathy, however HR has been stable with no s/s of AV block pattern -Would recommend OP monitor at discharge to assess for arrhthymia induced syncope and would otherwise continue to assess for non-cardiac etiology   2.  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 Nuclear stress test at the same time revealed low EF but no reversible ischemia.  -Repeat echo with LVEF to 50-55%. There was mild concentric LVH. There was moderate mitral calcification with moderate sclerosis of the tricuspid aortic valve. There is mild dilatation at the level of the sinuses of Valsalva measuring 39 mm and the ascending aorta at 24mm- -Continue current regimen with Entresto. Unable to up-titrate given low normal BP today -Watch creatinine. If  continues to rise, may need to hold in the setting of AKI  -Weight stable and no complaints of recent LE swelling or orthopnea   3.  Hypertension: -Stable, 107/76>109/69>126/88 -Continue labetalol, Entresto 24/26 -Consider changing labetalol to carvedilol   4.  Hyperlipidemia: -Last lDL, 159 with a goal of 100mg /dl  -Continue statin   5. DM2: -SSI for glucose control while inpatient status -Per IM  6. Renal Cell Carcinoma: -Recent left nephrectomy 3 weeks ago -Needs nephrology consult  For questions or updates, please contact [facility] Please consult [email or web address] for contact info under Cardiology/STEMI.   SignedKathyrn Drown NP-C [facility] Pager: [phone] 04/19/2019 10:05 AM   I have personally seen and examined this patient with Kathyrn Drown, NP-C. I agree with the assessment and plan as outlined above. RaymondBarrera is followed in our practice by Dr. Sallyanne Kuster for presumed non-ischemic cardiomyopathy. LVEF in July 2019 was 25%. He has been on appropriate medical therapy for his cardiomyopathy and has done extremely well over the past 9 months. Yesterday he had several syncopal spells per report. He thinks he blacked out. No palpitations or chest pain. He recalls taking an extra "nausea pill" with his oxycodone. Troponin is negative. EKG with NSR with no ischemic changes. Labs reviewed and ok. Telemetry reviewed and shows sinus rhythm. No pauses noted. Echo today with LVEF=50-55% with no significant valve abnormalities.   My exam shows a WDWN male in NAD. CV:RRR no murmurs. Lungs:clear bilaterally. AQT:MAUQ, NT. Ext: no edema  There is no clear cardiac etiology for his syncope. His LV function is now back to normal. I suspect his syncope was not cardiac related. EEG at bedside now per primary team. I would recommend watching him on telemetry today. Will see him in the am. Will likely need a cardiac monitor at time of discharge.   Lauree Chandler 04/19/2019 12:14  PM

## 2019-04-19 NOTE — Evaluation (Signed)
Physical Therapy Evaluation/ Discharge Patient Details Name: Raymond Barrera. MRN: [id number] DOB: 1961-06-16 Today's Date: 04/19/2019   History of Present Illness  58 yo admitted with 2 periods of syncope with possible seizure. PMhx: bil THA, lumbar fusion 2019, Left nephrectomy, HTN, HLD, DM, heart failure, CVA, cocaine use  Clinical Impression  Pt very pleasant and reports he thinks taking additional medication at home contributed to his syncope. No lightheadedness or presyncope throughout session with pt stable with RW for gait and reports increased left hip pain as reason for orthopedic visit he missed PTA. Pt with noted left hip flexor and hamstring weakness with pain and educated for HEP to strengthen leg as able to tolerate due to pain. Pt at baseline functional level with all necessary DME at home. No further acute needs and will sign off, pt aware and agreeable.     Follow Up Recommendations No PT follow up    Equipment Recommendations  None recommended by PT    Recommendations for Other Services       Precautions / Restrictions Precautions Precautions: None      Mobility  Bed Mobility Overal bed mobility: Modified Independent                Transfers Overall transfer level: Modified independent                  Ambulation/Gait Ambulation/Gait assistance: Modified independent (Device/Increase time) Gait Distance (Feet): 400 Feet Assistive device: Rolling walker (2 wheeled) Gait Pattern/deviations: Step-through pattern;Decreased stride length   Gait velocity interpretation: >4.37 ft/sec, indicative of normal walking speed    Stairs            Wheelchair Mobility    Modified Rankin (Stroke Patients Only)       Balance Overall balance assessment: Mild deficits observed, not formally tested                                           Pertinent Vitals/Pain Pain Assessment: 0-10 Pain Score: 4  Pain Location: left hip  with movement Pain Descriptors / Indicators: Burning;Aching Pain Intervention(s): Limited activity within patient's tolerance;Monitored during session;Repositioned    Home Living Family/patient expects to be discharged to:: Private residence Living Arrangements: Spouse/significant other Available Help at Discharge: Family;Available 24 hours/day Type of Home: House Home Access: Level entry     Home Layout: Two level;Able to live on main level with bedroom/bathroom Home Equipment: Gilford Rile - 2 wheels;Cane - single point;Wheelchair - power;Tub bench;Hand held shower head      Prior Function Level of Independence: Independent with assistive device(s)         Comments: walks with walker, uses power chair to go from his basement apartment to first floor where mom lives, uses tub bench for bathing     Hand Dominance        Extremity/Trunk Assessment   Upper Extremity Assessment Upper Extremity Assessment: Overall WFL for tasks assessed    Lower Extremity Assessment Lower Extremity Assessment: RLE deficits/detail;LLE deficits/detail RLE Deficits / Details: WFL with 5/5 strength RLE LLE Deficits / Details: hip flexion and knee flexion 3/5 with pain limiting strength and function    Cervical / Trunk Assessment Cervical / Trunk Assessment: Normal  Communication   Communication: No difficulties  Cognition Arousal/Alertness: Awake/alert Behavior During Therapy: WFL for tasks assessed/performed Overall Cognitive Status: Within Functional Limits for tasks assessed  General Comments      Exercises General Exercises - Lower Extremity Heel Slides: AROM;Left;Seated(2 reps and unable to tolerate more due to pain) Hip Flexion/Marching: AROM;10 reps;Seated;Left   Assessment/Plan    PT Assessment Patent does not need any further PT services  PT Problem List         PT Treatment Interventions      PT Goals (Current goals  can be found in the Care Plan section)  Acute Rehab PT Goals PT Goal Formulation: All assessment and education complete, DC therapy    Frequency     Barriers to discharge        Co-evaluation               AM-PAC PT "6 Clicks" Mobility  Outcome Measure Help needed turning from your back to your side while in a flat bed without using bedrails?: None Help needed moving from lying on your back to sitting on the side of a flat bed without using bedrails?: None Help needed moving to and from a bed to a chair (including a wheelchair)?: None Help needed standing up from a chair using your arms (e.g., wheelchair or bedside chair)?: None Help needed to walk in hospital room?: A Little Help needed climbing 3-5 steps with a railing? : A Lot 6 Click Score: 21    End of Session Equipment Utilized During Treatment: Gait belt Activity Tolerance: Patient tolerated treatment well Patient left: in chair;with call bell/phone within reach Nurse Communication: Mobility status PT Visit Diagnosis: Other abnormalities of gait and mobility (R26.89)    Time: 1308-6578 PT Time Calculation (min) (ACUTE ONLY): 19 min   Charges:   PT Evaluation $PT Eval Moderate Complexity: [place], PT Acute Rehabilitation Services Pager: [phone] Office: [place] 04/19/2019, 11:49 AM

## 2019-04-19 NOTE — Progress Notes (Signed)
  Echocardiogram 2D Echocardiogram has been performed.  Darlina Sicilian M 04/19/2019, 8:35 AM

## 2019-04-19 NOTE — Progress Notes (Signed)
EEG complete - results pending 

## 2019-04-19 NOTE — Progress Notes (Signed)
OT Cancellation Note and Discharge  Patient Details Name: Raymond Barrera. MRN: [id number] DOB: 27-Feb-1961   Cancelled Treatment:    Reason Eval/Treat Not Completed: OT screened, no needs identified, will sign off. Pt up and walking in hallway with PT at a Mod I level (using RW), pt has all needed DME and AE per his report. No OT needs identified.   [place], OTR/L Acute Rehab Services Pager [phone] Office [phone]     Almon Register 04/19/2019, 8:37 AM

## 2019-04-19 NOTE — Procedures (Signed)
ELECTROENCEPHALOGRAM REPORT   Patient: Raymond Barrera.       Room #: 3E26C EEG No. ID: [id number] Age: 58 y.o.        Sex: male Referring Physician: Hensel Report Date:  04/19/2019        Interpreting Physician: Alexis Goodell  History: Delvis Kau. is an 58 y.o. male with syncope  Medications:  Colace, Insulin, Normodyne, Crestor, Entresto  Conditions of Recording:  This is a 21 channel routine scalp EEG performed with bipolar and monopolar montages arranged in accordance to the international 10/20 system of electrode placement. One channel was dedicated to EKG recording.  The patient is in the awake, drowsy and asleep states.  Description:  The waking background activity consists of a low voltage, symmetrical, fairly well organized, 9 Hz alpha activity, seen from the parieto-occipital and posterior temporal regions.  Low voltage fast activity, poorly organized, is seen anteriorly and is at times superimposed on more posterior regions.  A mixture of theta and alpha rhythms are seen from the central and temporal regions. The patient drowses with slowing to irregular, low voltage theta and beta activity.   The patient goes in to a light sleep with symmetrical sleep spindles, vertex central sharp transients and irregular slow activity.   No epileptiform activity is noted.   Hyperventilation was not performed.  Intermittent photic stimulation was performed but failed to illicit any change in the tracing.   IMPRESSION: Normal electroencephalogram, awake, asleep and with activation procedures. There are no focal lateralizing or epileptiform features.   Alexis Goodell, MD Neurology [phone] 04/19/2019, 6:44 PM

## 2019-04-20 ENCOUNTER — Other Ambulatory Visit: Payer: Self-pay | Admitting: Cardiology

## 2019-04-20 DIAGNOSIS — I10 Essential (primary) hypertension: Secondary | ICD-10-CM

## 2019-04-20 DIAGNOSIS — E782 Mixed hyperlipidemia: Secondary | ICD-10-CM

## 2019-04-20 DIAGNOSIS — N289 Disorder of kidney and ureter, unspecified: Secondary | ICD-10-CM

## 2019-04-20 DIAGNOSIS — Z905 Acquired absence of kidney: Secondary | ICD-10-CM

## 2019-04-20 DIAGNOSIS — E7800 Pure hypercholesterolemia, unspecified: Secondary | ICD-10-CM

## 2019-04-20 DIAGNOSIS — R55 Syncope and collapse: Secondary | ICD-10-CM

## 2019-04-20 DIAGNOSIS — N189 Chronic kidney disease, unspecified: Secondary | ICD-10-CM

## 2019-04-20 DIAGNOSIS — I519 Heart disease, unspecified: Secondary | ICD-10-CM

## 2019-04-20 LAB — CBC
HCT: 35.3 % — ABNORMAL LOW (ref 39.0–52.0)
Hemoglobin: 11.5 g/dL — ABNORMAL LOW (ref 13.0–17.0)
MCH: 28.8 pg (ref 26.0–34.0)
MCHC: 32.6 g/dL (ref 30.0–36.0)
MCV: 88.3 fL (ref 80.0–100.0)
Platelets: 303 10*3/uL (ref 150–400)
RBC: 4 MIL/uL — ABNORMAL LOW (ref 4.22–5.81)
RDW: 13.4 % (ref 11.5–15.5)
WBC: 6.8 10*3/uL (ref 4.0–10.5)
nRBC: 0 % (ref 0.0–0.2)

## 2019-04-20 LAB — BASIC METABOLIC PANEL
Anion gap: 9 (ref 5–15)
BUN: 30 mg/dL — ABNORMAL HIGH (ref 6–20)
CO2: 25 mmol/L (ref 22–32)
Calcium: 9.6 mg/dL (ref 8.9–10.3)
Chloride: 103 mmol/L (ref 98–111)
Creatinine, Ser: 1.93 mg/dL — ABNORMAL HIGH (ref 0.61–1.24)
GFR calc Af Amer: 43 mL/min — ABNORMAL LOW (ref >60)
GFR calc non Af Amer: 37 mL/min — ABNORMAL LOW (ref >60)
Glucose, Bld: 128 mg/dL — ABNORMAL HIGH (ref 70–99)
Potassium: 4.4 mmol/L (ref 3.5–5.1)
Sodium: 137 mmol/L (ref 135–145)

## 2019-04-20 LAB — GLUCOSE, CAPILLARY
Glucose-Capillary: 155 mg/dL — ABNORMAL HIGH (ref 70–99)
Glucose-Capillary: 124 mg/dL — ABNORMAL HIGH (ref 70–99)

## 2019-04-20 MED ORDER — ROSUVASTATIN CALCIUM 20 MG PO TABS: 20.0000 mg | ORAL_TABLET | Freq: Every day | ORAL | Status: DC

## 2019-04-20 MED ORDER — ROSUVASTATIN CALCIUM 20 MG PO TABS: 20.0000 mg | ORAL_TABLET | Freq: Every day | ORAL | 0 refills | Status: DC

## 2019-04-20 NOTE — Progress Notes (Signed)
Pt called to states wife is at entrance and asking aobut discharge, I paged MD per Dr Tarry Kos they are putting in the dc order now, I educated pt that I was waiting on dc order

## 2019-04-20 NOTE — Progress Notes (Signed)
Order for 2 week event monitor placed. Our will set up monitor delivery.

## 2019-04-20 NOTE — Progress Notes (Signed)
Progress Note  Patient Name: Raymond Barrera. Date of Encounter: 04/20/2019  Primary Cardiologist: Sanda Klein, MD   Subjective   Feels well and is eager to go home. Renal function parameters have improved  Inpatient Medications    Scheduled Meds:  docusate sodium  100 mg Oral BID   enoxaparin (LOVENOX) injection  40 mg Subcutaneous Q24H   insulin aspart  0-9 Units Subcutaneous TID WC   labetalol  200 mg Oral BID   rosuvastatin  40 mg Oral QHS   sacubitril-valsartan  1 tablet Oral BID   Continuous Infusions:  PRN Meds: acetaminophen **OR** acetaminophen, ondansetron   Vital Signs    Vitals:   04/19/19 0720 04/19/19 2018 04/20/19 0443 04/20/19 0936  BP: 107/76 127/82 120/87 (!) 129/94  Pulse: 100 78 76 77  Resp:  20 20 18   Temp:  98.1 F (36.7 C) 98.2 F (36.8 C) 97.9 F (36.6 C)  TempSrc:  Oral Oral Oral  SpO2: 95% 98% 97% 99%  Weight:   117.4 kg   Height:        Intake/Output Summary (Last 24 hours) at 04/20/2019 0948 Last data filed at 04/20/2019 0945 Gross per 24 hour  Intake 1750 ml  Output 1050 ml  Net 700 ml   Last 3 Weights 04/20/2019 04/19/2019 04/18/2019  Weight (lbs) 258 lb 14.4 oz 259 lb 261 lb 4.8 oz  Weight (kg) 117.436 kg 117.482 kg 118.525 kg      Telemetry    NSR - Personally Reviewed  ECG    Admit EKG 5/19 showed NSR with no ischemic changes - Personally Reviewed  Physical Exam  Appears well. Lying fully flat. GEN: No acute distress.   Neck: No JVD Cardiac: RRR, no murmurs, rubs, or gallops.  Respiratory: Clear to auscultation bilaterally. GI: Soft, nontender, non-distended  MS: No edema; No deformity. Neuro:  Nonfocal  Psych: Normal affect   Labs    Chemistry Recent Labs  Lab 04/18/19 1137 04/19/19 0507 04/20/19 0503  NA 140 136 137  K 4.8 4.3 4.4  CL 105 102 103  CO2 23 23 25   GLUCOSE 159* 175* 128*  BUN 37* 35* 30*  CREATININE 2.54* 2.18* 1.93*  CALCIUM 9.5 9.7 9.6  PROT 7.8  --   --   ALBUMIN 3.7   --   --   AST 17  --   --   ALT 21  --   --   ALKPHOS 101  --   --   BILITOT 0.7  --   --   GFRNONAA 26* 32* 37*  GFRAA 31* 37* 43*  ANIONGAP 12 11 9      Hematology Recent Labs  Lab 04/18/19 1137 04/19/19 0507 04/20/19 0503  WBC 11.7* 7.0 6.8  RBC 4.11* 3.97* 4.00*  HGB 11.9* 11.6* 11.5*  HCT 37.5* 35.7* 35.3*  MCV 91.2 89.9 88.3  MCH 29.0 29.2 28.8  MCHC 31.7 32.5 32.6  RDW 13.7 13.7 13.4  PLT 316 305 303    Cardiac Enzymes Recent Labs  Lab 04/18/19 1137  TROPONINI <0.03    Recent Labs  Lab 04/18/19 1145  TROPIPOC 0.00     BNP Recent Labs  Lab 04/18/19 1138  BNP 6.9     DDimer No results for input(s): DDIMER in the last 168 hours.   Radiology    Ct Abdomen Pelvis Wo Contrast  Result Date: 04/18/2019 CLINICAL DATA:  Recent oophorectomy on the left with pain, initial encounter EXAM: CT ABDOMEN AND  PELVIS WITHOUT CONTRAST TECHNIQUE: Multidetector CT imaging of the abdomen and pelvis was performed following the standard protocol without IV contrast. COMPARISON:  Mild bibasilar atelectatic changes are noted. FINDINGS: Lower chest: No acute abnormality. Hepatobiliary: No focal liver abnormality is seen. No gallstones, gallbladder wall thickening, or biliary dilatation. Pancreas: Unremarkable. No pancreatic ductal dilatation or surrounding inflammatory changes. Spleen: Normal in size without focal abnormality. Adrenals/Urinary Tract: The adrenal glands are within normal limits. Postsurgical changes are noted on the left consistent with complete nephrectomy. No sizable hematoma or fluid collection is noted. No abscess is seen. The right kidney demonstrates no obstructive change. The bladder is partially distended. Stomach/Bowel: The appendix is within normal limits. No obstructive or inflammatory changes of the large or small bowel are seen. Vascular/Lymphatic: Aortic atherosclerosis. No enlarged abdominal or pelvic lymph nodes. Reproductive: Prostate is unremarkable.  Other: No abdominal wall hernia or abnormality. No abdominopelvic ascites. Postsurgical changes are noted in the subcutaneous tissues consistent with the robotic ports. Musculoskeletal: Bilateral hip replacements are noted. Lumbar fusion is seen as well. No acute bony abnormality is seen. IMPRESSION: Status post left nephrectomy. No postoperative hematoma or seroma is seen. Bibasilar atelectatic changes are seen. Electronically Signed   By: Inez Catalina M.D.   On: 04/18/2019 15:45   Dg Chest 2 View  Result Date: 04/19/2019 CLINICAL DATA:  Syncopal episode EXAM: CHEST - 2 VIEW COMPARISON:  04/18/2019, 03/30/2019, 07/14/2018 FINDINGS: Chronic elevation of left diaphragm with adjacent atelectasis or scar. No pleural effusion. Mild cardiomegaly. No pneumothorax. IMPRESSION: No active cardiopulmonary disease. Stable elevation of left diaphragm with atelectasis or scarring at left base. Mild cardiomegaly Electronically Signed   By: Donavan Foil M.D.   On: 04/19/2019 21:06   Ct Head Wo Contrast  Result Date: 04/18/2019 CLINICAL DATA:  Syncope EXAM: CT HEAD WITHOUT CONTRAST TECHNIQUE: Contiguous axial images were obtained from the base of the skull through the vertex without intravenous contrast. COMPARISON:  Apr 18, 2019 study obtained earlier in the day; brain MRI February 02, 2017 FINDINGS: Brain: There is mild diffuse atrophy for age. There is no intracranial mass, hemorrhage, extra-axial fluid collection, or midline shift. There is patchy small vessel disease in the centra semiovale bilaterally, stable. There is evidence of a prior lacunar infarct involving the genu of the right internal capsule and immediately adjacent lateral anterior left thalamus. No acute infarct is demonstrable. There is benign calcification along the posterior left falx, stable. Vascular: There is no hyperdense vessel. There is calcification in each distal vertebral artery as well as in each carotid siphon region. Skull: The bony calvarium  appears intact. Sinuses/Orbits: There is mild mucosal thickening in several anterior ethmoid air cells. Other visualized paranasal sinuses are clear. Orbits appear symmetric bilaterally. Other: Mastoid air cells are clear. IMPRESSION: 1. Mild diffuse atrophy for age. 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 Prior small infarct at the genu of the left internal capsule and adjacent anterolateral thalamus. 32 no acute infarct evident. No apparent mass or hemorrhage. Benign-appearing calcification noted adjacent to the left falx shows no surrounding edema or mass effect and is not felt to have clinical significance. 2.  Foci of arterial vascular calcification noted. 3.  Mild mucosal thickening several ethmoid air cells. Electronically Signed   By: Lowella Grip III M.D.   On: 04/18/2019 15:48   Mr Brain Wo Contrast  Result Date: 04/19/2019 CLINICAL DATA:  Initial evaluation for altered mental status, syncopal episodes. EXAM: MRI HEAD WITHOUT CONTRAST TECHNIQUE: Multiplanar, multiecho pulse sequences  of the brain and surrounding structures were obtained without intravenous contrast. COMPARISON:  Prior CT from 04/18/2019. FINDINGS: Brain: Generalized age-related cerebral atrophy. Patchy and confluent T2/FLAIR hyperintensity within the periventricular deep white matter both cerebral hemispheres most consistent with chronic small vessel ischemic disease, moderate nature. Superimposed remote lacunar infarcts present within the bilateral basal ganglia. Associated wallerian degeneration on the left. No abnormal foci of restricted diffusion to suggest acute or subacute ischemia. Gray-white matter differentiation maintained. No encephalomalacia to suggest chronic cortical infarction. No foci of susceptibility artifact to suggest acute or chronic intracranial hemorrhage. No mass lesion, midline shift or mass effect. No hydrocephalus. No extra-axial fluid collection. No intrinsic temporal lobe  abnormality. Pituitary gland suprasellar region normal. Midline structures intact. Vascular: Major intracranial vascular flow voids are well maintained. Skull and upper cervical spine: Craniocervical junction normal. Upper cervical spine within normal limits. Bone marrow signal intensity normal. No scalp soft tissue abnormality. Sinuses/Orbits: Globes and orbital soft tissues within normal limits. Mild scattered mucosal thickening throughout the paranasal sinuses. No air-fluid levels to suggest acute sinusitis. Mastoid air cells are clear. Inner ear structures grossly normal. Other: None. IMPRESSION: 1. No acute intracranial abnormality or findings to explain patient's symptoms identified. 2. Moderate chronic microvascular ischemic disease with superimposed remote lacunar infarcts involving the bilateral basal ganglia. Electronically Signed   By: Jeannine Boga M.D.   On: 04/19/2019 14:56   Nm Pulmonary Perfusion  Result Date: 04/19/2019 CLINICAL DATA:  Shortness of breath and syncope EXAM: NUCLEAR MEDICINE PERFUSION LUNG SCAN TECHNIQUE: Perfusion images were obtained in multiple projections after intravenous injection of radiopharmaceutical. Ventilation scans intentionally deferred if perfusion scan and chest x-ray adequate for interpretation during COVID 19 epidemic. RADIOPHARMACEUTICALS:  1.7 mCi Tc-58m MAA IV COMPARISON:  Chest radiograph Apr 18, 2019 FINDINGS: Radiotracer uptake is homogeneous and symmetric bilaterally. No focal perfusion defects are identified. There is cardiomegaly. IMPRESSION: Perfusion defects evident. This study constitutes a very low probability of pulmonary embolus. There is cardiomegaly. Electronically Signed   By: Lowella Grip III M.D.   On: 04/19/2019 15:15   Dg Chest Port 1 View  Result Date: 04/18/2019 CLINICAL DATA:  Chest pain.  Syncopal episode. EXAM: PORTABLE CHEST 1 VIEW COMPARISON:  Radiographs 04/18/2019, 03/30/2019 and 01/14/2019. FINDINGS: 1156 hours.  Stable chronic elevation of the left hemidiaphragm with associated chronic left basilar atelectasis or scarring. There is no confluent airspace opacity, edema, pleural effusion or pneumothorax. Mild enlargement of the cardiac silhouette is stable. No acute osseous findings. IMPRESSION: Stable chronic left basilar atelectasis or scarring. No acute findings. Electronically Signed   By: Richardean Sale M.D.   On: 04/18/2019 12:21    Cardiac Studies   Echocardiogram 04/19/2019:  1. The left ventricle has low normal systolic function, with an ejection fraction of 50-55%. The cavity size was normal. There is mild concentric left ventricular hypertrophy. Left ventricular diastolic Doppler parameters are consistent with  pseudonormalization. 2. The right ventricle has normal systolic function. The cavity was normal. There is no increase in right ventricular wall thickness. 3. Left atrial size was moderately dilated. 4. There is moderate mitral annular calcification present. 5. The aortic valve is tricuspid. Moderate thickening of the aortic valve. Moderate sclerosis of the aortic valve. Aortic valve regurgitation was not assessed by color flow Doppler. 6. There is mild dilatation at the level of the sinuses of Valsalva measuring 39 mm and the ascending aorta at 53mm.  Lexiscan Myoview 06/15/2018:  Nuclear stress EF: 24%.  There was no ST segment  deviation noted during stress.  No T wave inversion was noted during stress.  This is a high risk study.  The left ventricular ejection fraction is severely decreased (<30%).  High risk nuclear study due to severe dilated cardiomyopathy, LVEF<30%. There is no reversible ischemia.  Echo 06/15/2018: Study Conclusions  - Left ventricle: The cavity size was mildly dilated. There was moderate concentric hypertrophy. Systolic function was severely reduced. The estimated ejection fraction was in the range of 20% to 25%. Severe diffuse  hypokinesis with no identifiable regional variations. Doppler parameters are consistent with abnormal left ventricular relaxation (grade 1 diastolic dysfunction). Acoustic contrast opacification revealed no evidence ofthrombus. - Left atrium: The atrium was severely dilated. - Direct comparison with images from March 2018 shows little change in left ventricular function.    Patient Profile     Raymond Barrera. is a 58 y.o. 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 Kris Mouton.  Assessment & Plan    1. Syncope: reports several syncopal spells day prior to admit. He thinks he blacked out. No palpitations or chest pain. He recalls taking an extra "nausea pill" with his oxycodone. Cardiac assessment unremarkable. EKG NSR w/o ischemic changes. Troponin negative. Echo yesterday showed near-normal LVEF and no significant valvular abnormalties.  The normalization of left ventricular systolic function greatly reduces the likelihood that he had ventricular arrhythmia as a cause of his syncope.  Overall, I suspect hypotension due to hypovolemia may have been the cause of his syncope.  This may have been related to reduced p.o. intake (some nausea may be due to opiate analgesics) and the forced diuresis related to excessive caffeine consumption (talking to his wife he drinks sweet tea, Dr. Malachi Bonds and [facility] almost exclusively). Nevertheless, due to the concern for possible arrhythmia, we will have him wear a 14-day event monitor after discharge. 2. CHF/CMP: Remarkable improvement in left ventricular systolic function on medical therapy (primarily blood pressure control).  Since he now has moderate renal dysfunction following  his nephrectomy, the benefits of Entresto may be outweighed by the risk.  Will stop all RAAS inhibitors and continue treatment with labetalol and amlodipine. 3. Ac on chr renal insuff: Following his nephrectomy his creatinine increased from 1.2-1.95, on the current admission his creatinine was further elevated to 2.54 on arrival, improved with hydration back to the baseline of 1.93.  The strongly suggest that the acute worsening was related to hypovolemia. 4. HLP: He has significant hypercholesterolemia with markedly elevated LDL cholesterol, but he was not receiving his simvastatin for a large part of last month, after being told to stop it for his nephrectomy.  Having said that, his simvastatin is probably not an appropriate agent while he also takes amlodipine.  Agree with switching to rosuvastatin but because of the high risk of complications in patients with more advanced kidney disease, avoid doses higher than 20 mg daily.   [facility] will sign off.   Medication Recommendations: Stop Entresto and simvastatin.  Rosuvastatin 20 mg daily, labetalol 200 mg twice daily, amlodipine should be restarted as his blood pressure requires. Would restart at the 2.5 mg daily dose and we can adjust as an outpatient. Other recommendations (labs, testing, etc): 14-day event monitor has been  arranged Follow up as an outpatient: Repeat lipid profile and metabolic panel in 4 weeks, follow-up visit with me in 6 weeks.   Sanda Klein, MD, [facility] [phone] office [phone] pager 04/20/2019 10:57 AM

## 2019-04-20 NOTE — Discharge Instructions (Signed)
Hospitalized at [facility] experiencing 2 episodes of syncope.  Your work-up including imaging and labs were all negative.  We expect your syncopal episodes may have been due to low blood pressure.  Do not believe this is due to your heart.  We consulted cardiology during your hospitalization and with their help we have decided to discontinue your Norvasc and Entresto.  We have also decreased your cholesterol medicine to Crestor 20 mg.  I have placed a referral to neurology for further evaluation due to some concern for seizure-like activity.  Cardiology will be in touch with you regarding follow up and picking up a Holter monitor to assess for arrhythmias. Please be sure to make these appointments. We also recommend you see your PCP in 1 week for hospital follow up.   Guards to your blood pressure, we recommend rising slowly from lying, sitting, and standing position to avoid getting dizzy and your blood pressure dropping.  He began to experience recurring symptoms we recommend you see your doctor soon as possible.  Thank you so much for allowing [place] to take care of you.

## 2019-04-20 NOTE — Progress Notes (Signed)
Future orders for FLP and CMP placed. Plan to check labs in 4-5 weeks.

## 2019-04-26 NOTE — Telephone Encounter (Signed)
Sounds like orthostatic hypotension, which can be particularly prominent with labetalol and other alpha blocking agents. We stopped his Entresto during recent hospitalization since his left ventricular ejection fraction had normalized and his kidney function had deteriorated. For now I told him to reduce the dose of labetalol 200 mg twice daily.  He has an appointment with his PCP on June 5 and an appointment with me on June 10, with a lab scheduled in the meantime.

## 2019-05-02 ENCOUNTER — Telehealth: Payer: Self-pay | Admitting: *Deleted

## 2019-05-02 NOTE — Telephone Encounter (Signed)
[employer] to ship a 14 day cardiac event monitor to his home.  Instructions reviewed briefly as they are included in the monitor kit.

## 2019-05-04 ENCOUNTER — Ambulatory Visit (INDEPENDENT_AMBULATORY_CARE_PROVIDER_SITE_OTHER): Payer: Medicaid Other

## 2019-05-04 ENCOUNTER — Telehealth: Payer: Self-pay | Admitting: *Deleted

## 2019-05-04 DIAGNOSIS — R55 Syncope and collapse: Secondary | ICD-10-CM | POA: Diagnosis not present

## 2019-05-04 NOTE — Telephone Encounter (Signed)
Call placed to the patient to check on how he is doing. Left a message for the patient to call back.  Will also need to verify the dosage of labetalol he is currently taking and get blood pressure readings from him (please see patient advice request notes).

## 2019-05-11 NOTE — Telephone Encounter (Signed)
Message sent through My Chart. 

## 2019-05-13 ENCOUNTER — Other Ambulatory Visit: Payer: Self-pay | Admitting: *Deleted

## 2019-05-13 MED ORDER — LABETALOL HCL 100 MG PO TABS: 100.0000 mg | ORAL_TABLET | Freq: Two times a day (BID) | ORAL | Status: DC

## 2019-05-19 ENCOUNTER — Telehealth: Payer: Medicaid Other | Admitting: Physician Assistant

## 2019-05-19 DIAGNOSIS — R112 Nausea with vomiting, unspecified: Secondary | ICD-10-CM

## 2019-05-19 DIAGNOSIS — R630 Anorexia: Secondary | ICD-10-CM

## 2019-05-19 DIAGNOSIS — R110 Nausea: Secondary | ICD-10-CM

## 2019-05-19 NOTE — Progress Notes (Signed)
Based on what you shared with me, I feel your condition warrants further evaluation and I recommend that you be seen for a face to face office visit.  Giving nausea and inability to eat after an injury to the abdomen or head, you need to be seen for full assessment to rule out more serious causes of symptoms. This is above the scope of what we can assess via e-visit.   NOTE: If you entered your credit card information for this eVisit, you will not be charged. You may see a "hold" on your card for the $35 but that hold will drop off and you will not have a charge processed.  If you are having a true medical emergency please call 911.     For an urgent face to face visit, [place] has five urgent care centers for your convenience:    [email or web address] to reserve your spot online an avoid wait times  [facility] [address] [place], [place] [address] Modified hours of operation: Monday-Friday, 12 PM to 6 PM  Closed Saturday & Sunday  *Across the street from [place] (New Address!) [address] [place], [place] [address] *Just off [address], across the road from [place] hours of operation: Monday-Friday, 12 PM to 6 PM  Closed Saturday & Sunday   The following sites will take your insurance:  . [facility]    [phone]                  Get Driving Directions  [address], [place] [address] . 10 am to 8 pm Monday-Friday . 12 pm to 8 pm Saturday-Sunday   . [facility] at [place]                  Get Driving Directions  [address], [place], [place] [address] . 8 am to 8 pm Monday-Friday . 9 am to 6 pm Saturday . 11 am to 6 pm Sunday   . [facility] at [place]                  Get Driving Directions   [address] [place], [place]  [address] . 8 am to 8 pm Monday-Friday . 8 am to 4 pm Saturday-Sunday    . [facility] [place]                    Get Driving Directions  [phone]  [address] [place], [place] [address]  . Monday-Friday, 12 PM to 6 PM    Your e-visit answers were reviewed by a board certified advanced clinical practitioner to complete your personal care plan.  Thank you for using e-Visits.

## 2019-05-23 ENCOUNTER — Other Ambulatory Visit: Payer: Self-pay

## 2019-05-24 ENCOUNTER — Telehealth: Payer: Self-pay

## 2019-05-24 NOTE — Telephone Encounter (Signed)
Pts wife called and said that he is having some left lower quadrant pain and would like to get him in for an appt.   Spoke with Dr Donnetta Hutching as this patient did have an evacuation of hematoma at left lower quadrant in August of 2019. Per Dr Donnetta Hutching, not likely that the pain is from that this far out and he should start with a consult with his PCP.   Pt and wife notified.   York Cerise, CMA

## 2019-06-09 ENCOUNTER — Encounter: Payer: Self-pay | Admitting: *Deleted

## 2019-06-10 ENCOUNTER — Telehealth: Payer: Medicaid Other | Admitting: Cardiovascular Disease

## 2019-06-28 ENCOUNTER — Other Ambulatory Visit: Payer: Self-pay | Admitting: Family Medicine

## 2019-07-10 ENCOUNTER — Emergency Department (HOSPITAL_COMMUNITY): Payer: Medicaid Other

## 2019-07-10 ENCOUNTER — Emergency Department (HOSPITAL_COMMUNITY)
Admission: EM | Admit: 2019-07-10 | Discharge: 2019-07-10 | Disposition: A | Discharge status: Home / Self Care | Payer: Medicaid Other | Attending: Emergency Medicine | Admitting: Emergency Medicine

## 2019-07-10 ENCOUNTER — Other Ambulatory Visit: Payer: Self-pay

## 2019-07-10 ENCOUNTER — Encounter (HOSPITAL_COMMUNITY): Payer: Self-pay | Admitting: Emergency Medicine

## 2019-07-10 DIAGNOSIS — F17210 Nicotine dependence, cigarettes, uncomplicated: Secondary | ICD-10-CM | POA: Insufficient documentation

## 2019-07-10 DIAGNOSIS — I252 Old myocardial infarction: Secondary | ICD-10-CM | POA: Diagnosis not present

## 2019-07-10 DIAGNOSIS — Z79899 Other long term (current) drug therapy: Secondary | ICD-10-CM | POA: Diagnosis not present

## 2019-07-10 DIAGNOSIS — N183 Chronic kidney disease, stage 3 unspecified: Secondary | ICD-10-CM

## 2019-07-10 DIAGNOSIS — I5022 Chronic systolic (congestive) heart failure: Secondary | ICD-10-CM | POA: Diagnosis not present

## 2019-07-10 DIAGNOSIS — M25511 Pain in right shoulder: Secondary | ICD-10-CM | POA: Insufficient documentation

## 2019-07-10 DIAGNOSIS — Z7982 Long term (current) use of aspirin: Secondary | ICD-10-CM | POA: Diagnosis not present

## 2019-07-10 DIAGNOSIS — Z7984 Long term (current) use of oral hypoglycemic drugs: Secondary | ICD-10-CM | POA: Diagnosis not present

## 2019-07-10 DIAGNOSIS — Z96643 Presence of artificial hip joint, bilateral: Secondary | ICD-10-CM | POA: Insufficient documentation

## 2019-07-10 DIAGNOSIS — I130 Hypertensive heart and chronic kidney disease with heart failure and stage 1 through stage 4 chronic kidney disease, or unspecified chronic kidney disease: Secondary | ICD-10-CM | POA: Insufficient documentation

## 2019-07-10 DIAGNOSIS — M7981 Nontraumatic hematoma of soft tissue: Secondary | ICD-10-CM | POA: Diagnosis not present

## 2019-07-10 DIAGNOSIS — Z8673 Personal history of transient ischemic attack (TIA), and cerebral infarction without residual deficits: Secondary | ICD-10-CM | POA: Diagnosis not present

## 2019-07-10 DIAGNOSIS — G8929 Other chronic pain: Secondary | ICD-10-CM | POA: Diagnosis not present

## 2019-07-10 DIAGNOSIS — E1122 Type 2 diabetes mellitus with diabetic chronic kidney disease: Secondary | ICD-10-CM | POA: Diagnosis not present

## 2019-07-10 LAB — CBC WITH DIFFERENTIAL/PLATELET
Abs Immature Granulocytes: 0.02 10*3/uL (ref 0.00–0.07)
Basophils Absolute: 0 10*3/uL (ref 0.0–0.1)
Basophils Relative: 0 %
Eosinophils Absolute: 0.1 10*3/uL (ref 0.0–0.5)
Eosinophils Relative: 3 %
HCT: 36.7 % — ABNORMAL LOW (ref 39.0–52.0)
Hemoglobin: 11.5 g/dL — ABNORMAL LOW (ref 13.0–17.0)
Immature Granulocytes: 0 %
Lymphocytes Relative: 27 %
Lymphs Abs: 1.3 10*3/uL (ref 0.7–4.0)
MCH: 29.2 pg (ref 26.0–34.0)
MCHC: 31.3 g/dL (ref 30.0–36.0)
MCV: 93.1 fL (ref 80.0–100.0)
Monocytes Absolute: 0.4 10*3/uL (ref 0.1–1.0)
Monocytes Relative: 9 %
Neutro Abs: 2.9 10*3/uL (ref 1.7–7.7)
Neutrophils Relative %: 61 %
Platelets: 173 10*3/uL (ref 150–400)
RBC: 3.94 MIL/uL — ABNORMAL LOW (ref 4.22–5.81)
RDW: 14.4 % (ref 11.5–15.5)
WBC: 4.8 10*3/uL (ref 4.0–10.5)
nRBC: 0 % (ref 0.0–0.2)

## 2019-07-10 LAB — BASIC METABOLIC PANEL
Anion gap: 6 (ref 5–15)
BUN: 36 mg/dL — ABNORMAL HIGH (ref 6–20)
CO2: 25 mmol/L (ref 22–32)
Calcium: 9.3 mg/dL (ref 8.9–10.3)
Chloride: 107 mmol/L (ref 98–111)
Creatinine, Ser: 2.21 mg/dL — ABNORMAL HIGH (ref 0.61–1.24)
GFR calc Af Amer: 36 mL/min — ABNORMAL LOW (ref >60)
GFR calc non Af Amer: 31 mL/min — ABNORMAL LOW (ref >60)
Glucose, Bld: 111 mg/dL — ABNORMAL HIGH (ref 70–99)
Potassium: 4.5 mmol/L (ref 3.5–5.1)
Sodium: 138 mmol/L (ref 135–145)

## 2019-07-10 MED ORDER — SODIUM CHLORIDE 0.9 % IV BOLUS: 500.0000 mL | Freq: Once | INTRAVENOUS | Status: DC

## 2019-07-10 MED ORDER — LIDOCAINE 5 % EX PTCH: 1.0000 | MEDICATED_PATCH | CUTANEOUS | 0 refills | Status: DC

## 2019-07-10 MED ORDER — LIDOCAINE 5 % EX PTCH
1.0000 | MEDICATED_PATCH | CUTANEOUS | Status: DC
  Administered 2019-07-10: 1 via TRANSDERMAL

## 2019-07-10 MED ORDER — ACETAMINOPHEN 500 MG PO TABS
1000.0000 mg | ORAL_TABLET | Freq: Once | ORAL | Status: AC
  Administered 2019-07-10: 1000 mg via ORAL
  Filled 2019-07-10: qty 2

## 2019-07-10 NOTE — ED Notes (Signed)
Awaiting recheck and dispo  Pt and spouse report cold, and increased pain PA aware

## 2019-07-10 NOTE — ED Notes (Signed)
From CT 

## 2019-07-10 NOTE — ED Notes (Signed)
Pt and wife provided more water

## 2019-07-10 NOTE — Discharge Instructions (Addendum)
It was my pleasure taking care of you today!  Apply lidoderm patches as needed for pain.  Take tylenol [phone] mg as needed for pain, not to exceed 4000mg  in one eday. Ice shoulder throughout the day (instructions below).  Wear shoulder sling for no more than 3 days, then begin performing gentle range of motion exercises.   Call the orthopedist listed today or tomorrow to schedule a follow up appointment for recheck of ongoing shoulder pain in 1-2 weeks that can be canceled with a 24-48 hour notice if complete resolution of pain.    Call the orthopedist listed if symptoms are not improved in one week.   Return to the ER for new or worsening symptoms, any additional concerns.  COLD THERAPY DIRECTIONS:  Ice or gel packs can be used to reduce both pain and swelling. Ice is the most helpful within the first 24 to 48 hours after an injury or flareup from overusing a muscle or joint.  Ice is effective, has very few side effects, and is safe for most people to use.   If you expose your skin to cold temperatures for too long or without the proper protection, you can damage your skin or nerves. Watch for signs of skin damage due to cold.   HOME CARE INSTRUCTIONS  Follow these tips to use ice and cold packs safely. These will help with the hematoma of the shoulder.  Place a dry or damp towel between the ice and skin. A damp towel will cool the skin more quickly, so you may need to shorten the time that the ice is used.  For a more rapid response, add gentle compression to the ice.  Ice for no more than 10 to 20 minutes at a time. The bonier the area you are icing, the less time it will take to get the benefits of ice.  Check your skin after 5 minutes to make sure there are no signs of a poor response to cold or skin damage.  Rest 20 minutes or more in between uses.  Once your skin is numb, you can end your treatment. You can test numbness by very lightly touching your skin. The touch should be so light  that you do not see the skin dimple from the pressure of your fingertip. When using ice, most people will feel these normal sensations in this order: cold, burning, aching, and numbness.    Your kidney function continues to be elevated today. You need to follow with a kidney specialist and STOP all ibuprofen, naproxen, Advil, Aleve, and Motrin.   Thank you for allowing [place] to participate in your care today.

## 2019-07-10 NOTE — ED Triage Notes (Signed)
Pt reports RT sided shoulder pain that radiates to neck x 2-3 months. Denies injury. States pain worsened yesterday.

## 2019-07-10 NOTE — ED Notes (Signed)
To Rad 

## 2019-07-10 NOTE — ED Notes (Signed)
PA in to assess 

## 2019-07-10 NOTE — ED Notes (Signed)
PA in to discuss findings 

## 2019-07-10 NOTE — ED Notes (Signed)
Per SO, pt has been seeing Dr Pearlie Oyster for continued R shoulder pain   Has seen x 2 due to pain, given pain meds without relief then told if persisted to come to ED for further eval   Pt has seen Ortho in [place] in past for spine issues   Tender to palpation to shoulder joint

## 2019-07-10 NOTE — ED Notes (Signed)
Pt reports he has not taken his HTN meds this am

## 2019-07-10 NOTE — ED Provider Notes (Signed)
[facility] EMERGENCY DEPARTMENT Provider Note   CSN: [id number] Arrival date & time: 07/10/19  1141     History   Chief Complaint Chief Complaint  Patient presents with  . Shoulder Pain    HPI Raymond Barrera. is a 58 y.o. male.     HPI   Patient is a 58 year old male with past medical history of type 2 diabetes mellitus, renal cell carcinoma status post left nephrectomy in April 2020, no metastatic disease identified, hypertension, heart failure with preserved ejection fraction, MI, TIA presenting for right shoulder pain, and mass in the right axilla.  Patient presents with his wife.  According to the patient, he has had 2 to 3 months of worsening right shoulder pain.  He reports that he was assisting his mother-in-law and moving some items couple months ago and thought he may have "overdone it", as he has had right shoulder pain ever since.  Describes pain with movement primarily abduction and raising his arms over his head.  He reports that pain is reproducible by palpating the deltoid.  He also notes that over the last couple days he has had an enlarging right axillary mass.  He has a history of lipomas.  Denies any significant erythema or any drainage from that site.  He denies any chest pain, shortness of breath, fevers, chills, nausea, vomiting.  He reports that he receives oxycodone from a pain clinic however he is currently out and it will not be renewed for 3 days.  Past Medical History:  Diagnosis Date  . Anxiety    due to the stroke  . Arthritis   . Back pain    hx of buldging disc  . Diabetes mellitus without complication ([place])    takes Metformin daily  . High cholesterol    takes Zocor daily  . Hypertension    takes Benazepril and HCTZ  daily  . Joint pain   . Joint swelling   . Memory impairment    occassional - from stroke  . Myocardial infarction ([place]) 1987  . Pneumonia    hx of-80's  . PONV (postoperative nausea and vomiting)    took a while for him to  wake up after previous anesthesia  . Shortness of breath dyspnea    do to pain  . Sleep apnea    never had a sleep study,but states Dr. Cindie Laroche says he has it  . Slurred speech   . Stroke ([place]) 08/2013   7 mini-strokes, last stroke 2017  . TIA (transient ischemic attack) 2014   x 7   . Urinary frequency     Patient Active Problem List   Diagnosis Date Noted  . Syncope 04/18/2019  . Chronic systolic heart failure ([place])   . Pain of both hip joints   . History of renal cell carcinoma   . Renal mass 03/30/2019  . Abdominal hematoma 08/13/2018  . Radiculopathy 06/16/2018  . Acute CVA (cerebrovascular accident) ([place]) 02/01/2017  . Diabetes mellitus ([place]) 02/01/2017  . HTN (hypertension) 02/01/2017  . Primary osteoarthritis of left hip 01/08/2016  . DJD (degenerative joint disease) 03/20/2015  . Primary osteoarthritis of right hip 02/26/2015  . Lumbar spondylosis 10/17/2014  . Lumbago 07/25/2014  . Abnormality of gait 07/25/2014  . Difficulty in walking(719.7) 07/25/2014  . TIA (transient ischemic attack) 03/09/2013  . Cocaine abuse ([place]) 03/09/2013  . Accelerated hypertension 03/09/2013  . Hyperlipidemia 03/09/2013  . Current smoker 03/09/2013    Past Surgical History:  Procedure Laterality  Date  . ABDOMINAL EXPOSURE N/A 06/16/2018   Procedure: ABDOMINAL EXPOSURE;  Surgeon: Rosetta Posner, MD;  Location: [facility];  Service: Vascular;  Laterality: N/A;  . ANKLE SURGERY  2008   left ankle-otif-[facility]  . ANTERIOR LUMBAR FUSION Bilateral 06/16/2018   Procedure: LUMBAR 4-5 LUMBAR 5-SACRUM 1 ANTERIOR LUMBAR INTERBODY FUSION WITH INSTRUMENTATION AND ALLOGRAFT;  Surgeon: Phylliss Bob, MD;  Location: [facility];  Service: Orthopedics;  Laterality: Bilateral;  . BACK SURGERY    . HEMATOMA EVACUATION Left 08/13/2018   Procedure: EVACUATION HEMATOMA LEFT ABDOMINAL WALL;  Surgeon: Rosetta Posner, MD;  Location: [facility];  Service: Vascular;  Laterality: Left;  . IR RADIOLOGIST EVAL & MGMT  07/27/2018   . IR US GUIDE BX ASP/DRAIN  07/14/2018  . JOINT REPLACEMENT     both hips replaced   . LUMBAR LAMINECTOMY/DECOMPRESSION MICRODISCECTOMY Right 10/17/2014   Procedure: LUMBAR LAMINECTOMY/DECOMPRESSION MICRODISCECTOMY 2 LEVELS;  Surgeon: [name], MD;  Location: [facility];  Service: Neurosurgery;  Laterality: Right;  Right L45 L5S1 laminectomy and foraminotomy  . MASS EXCISION  09/13/2012   Procedure: EXCISION MASS;  Surgeon: Jamesetta So, MD;  Location: [facility];  Service: General;  Laterality: N/A;  . ROBOTIC ASSITED PARTIAL NEPHRECTOMY Left 03/30/2019   Procedure: XI ROBOTIC ASSITED PARTIAL NEPHRECTOMY POSSIBLE RADICAL NEPHRECTOMY;  Surgeon: Ceasar Mons, MD;  Location: [facility];  Service: Urology;  Laterality: Left;  . TONSILLECTOMY    . TOTAL HIP ARTHROPLASTY Right 03/20/2015  . TOTAL HIP ARTHROPLASTY Right 03/20/2015   Procedure: TOTAL HIP ARTHROPLASTY ANTERIOR APPROACH;  Surgeon: Renette Butters, MD;  Location: [facility];  Service: Orthopedics;  Laterality: Right;  . TOTAL HIP ARTHROPLASTY Left 01/08/2016   Procedure: TOTAL HIP ARTHROPLASTY ANTERIOR APPROACH;  Surgeon: Renette Butters, MD;  Location: [facility];  Service: Orthopedics;  Laterality: Left;        Home Medications    Prior to Admission medications   Medication Sig Start Date End Date Taking? Authorizing Provider  acetaminophen (TYLENOL) 500 MG tablet Take 1,000 mg by mouth every 6 (six) hours as needed (for pain).    [provider]  aspirin EC 81 MG tablet Take 81 mg by mouth daily.    [provider]  dextrose (GLUTOSE) 40 % GEL Take 1 Tube by mouth once as needed for low blood sugar.    [provider]  docusate sodium (COLACE) 100 MG capsule Take 1 capsule (100 mg total) by mouth 2 (two) times daily. 03/30/19   Debbrah Alar, PA-C  gabapentin (NEURONTIN) 300 MG capsule Take 300 mg by mouth 3 (three) times daily.    [provider]  glucose 4 GM chewable tablet Chew 1  tablet by mouth as needed for low blood sugar.    [provider]  labetalol (NORMODYNE) 100 MG tablet Take 1 tablet (100 mg total) by mouth 2 (two) times daily. 05/13/19   Croitoru, Mihai, MD  lidocaine (LIDODERM) 5 % Place 1 patch onto the skin daily. Remove & Discard patch within 12 hours or as directed by MD 07/10/19   Langston Masker B, PA-C  metFORMIN (GLUCOPHAGE) 500 MG tablet Take 500 mg by mouth 2 (two) times daily with a meal.    [provider]  ondansetron (ZOFRAN) 4 MG tablet Take 1 tablet (4 mg total) by mouth every 8 (eight) hours as needed for nausea or vomiting. 03/30/19   Debbrah Alar, PA-C  Oxycodone HCl 10 MG TABS Take 1 tablet (10  mg total) by mouth 3 (three) times daily as needed (pain.). Patient taking differently: Take 10 mg by mouth 3 (three) times daily as needed (for pain).  03/30/19   Debbrah Alar, PA-C  rosuvastatin (CRESTOR) 20 MG tablet Take 1 tablet (20 mg total) by mouth at bedtime. 04/20/19   Mullis, Kiersten P, DO  tiZANidine (ZANAFLEX) 4 MG tablet Take 2-4 mg by mouth 2 (two) times daily as needed for muscle spasms.  02/22/19   [provider]    Family History Family History  Problem Relation Age of Onset  . Heart disease Father     Social History Social History   Tobacco Use  . Smoking status: Current Some Day Smoker    Packs/day: 0.25    Years: 47.00    Pack years: 11.75    Types: Cigarettes  . Smokeless tobacco: Never Used  . Tobacco comment: 2 cigarettes  Substance Use Topics  . Alcohol use: No    Comment: quit 2012  . Drug use: Not Currently    Types: Cocaine    Comment: many yrs ago., last time- late 2016     Allergies   Poison ivy extract [poison ivy extract]   Review of Systems Review of Systems  Constitutional: Negative for chills and fever.  Respiratory: Negative for chest tightness and shortness of breath.   Cardiovascular: Negative for chest pain.  Gastrointestinal: Negative for nausea and vomiting.   Musculoskeletal: Positive for arthralgias and myalgias. Negative for back pain.  Skin: Negative for color change.  Neurological: Negative for weakness and numbness.  All other systems reviewed and are negative.    Physical Exam Updated Vital Signs BP (!) 150/98 (BP Location: Right Arm) Comment: pt has not taken HTN meds  Pulse 67   Temp 98 F (36.7 C) (Oral)   Resp 16   Ht 6\' 6"  (1.981 m)   Wt 113.4 kg   SpO2 100%   BMI 28.89 kg/m   Physical Exam Vitals signs and nursing note reviewed.  Constitutional:      General: He is not in acute distress.    Appearance: He is well-developed. He is obese. He is not ill-appearing or diaphoretic.  HENT:     Head: Normocephalic and atraumatic.  Eyes:     Conjunctiva/sclera: Conjunctivae normal.     Pupils: Pupils are equal, round, and reactive to light.  Neck:     Musculoskeletal: Normal range of motion and neck supple.  Cardiovascular:     Rate and Rhythm: Normal rate and regular rhythm.     Heart sounds: S1 normal and S2 normal. No murmur.  Pulmonary:     Effort: Pulmonary effort is normal.     Breath sounds: Normal breath sounds. No wheezing or rales.  Abdominal:     General: There is no distension.     Palpations: Abdomen is soft.     Tenderness: There is no abdominal tenderness. There is no guarding.  Musculoskeletal:        General: Tenderness present. No deformity.       Arms:     Comments: Right shoulder with tenderness to palpation as depicted in image. Decreased ROM particularly with abduction. Positive empty can test, positive Neer's. No swelling, erythema or ecchymosis present. Right axilla exhibits soft tissue mass with no erythema, induration, or fluctuance. No step-off, crepitus, or deformity appreciated. 5/5 muscle strength of RUE. 2+ radial pulse, sensation intact and all compartments soft.  Lymphadenopathy:     Cervical: No cervical adenopathy.  Skin:    General: Skin is warm and dry.     Findings: No erythema  or rash.  Neurological:     Mental Status: He is alert.     Comments: Cranial nerves grossly intact. Patient moves extremities symmetrically and with good coordination.  Psychiatric:        Behavior: Behavior normal.        Thought Content: Thought content normal.        Judgment: Judgment normal.      ED Treatments / Results  Labs (all labs ordered are listed, but only abnormal results are displayed) Labs Reviewed  BASIC METABOLIC PANEL - Abnormal; Notable for the following components:      Result Value   Glucose, Bld 111 (*)    BUN 36 (*)    Creatinine, Ser 2.21 (*)    GFR calc non Af Amer 31 (*)    GFR calc Af Amer 36 (*)    All other components within normal limits  CBC WITH DIFFERENTIAL/PLATELET - Abnormal; Notable for the following components:   RBC 3.94 (*)    Hemoglobin 11.5 (*)    HCT 36.7 (*)    All other components within normal limits    EKG None  Radiology Ct Shoulder Right Wo Contrast  Result Date: 07/10/2019 CLINICAL DATA:  Right shoulder pain radiating into the neck for 2-3 months. Worsening pain since yesterday. Right axillary mass. EXAM: CT OF THE UPPER RIGHT EXTREMITY WITHOUT CONTRAST TECHNIQUE: Multidetector CT imaging of the upper right extremity was performed according to the standard protocol. COMPARISON:  Chest CT 02/25/2017. FINDINGS: Bones/Joint/Cartilage There is no evidence of acute fracture or dislocation. No significant glenohumeral arthropathy or joint effusion is identified. There is a type 3 acromion with mild acromioclavicular degenerative changes. Ligaments Suboptimally assessed by CT. Muscles and Tendons No significant narrowing of the subacromial space or focal muscular atrophy. Grossly intact biceps tendon and rotator cuff. Soft tissues Again demonstrated is a large intramuscular hematoma within the right latissimus dorsi muscle, measuring 7.8 x 4.9 x 11.1 cm. This was partially imaged on the previous chest CT and appears slightly larger.  This demonstrates no aggressive characteristics. Metallic densities posterior to this lesion and in the right axilla are presumably markers placed over the palpable concern. IMPRESSION: 1. No acute osseous findings at the right shoulder. Mild acromioclavicular degenerative changes. 2. 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 No aggressive characteristics. 3. No focal muscular abnormalities identified. Electronically Signed   By: Richardean Sale M.D.   On: 07/10/2019 14:31    Procedures Procedures (including critical care time)  Medications Ordered in ED Medications  lidocaine (LIDODERM) 5 % 1 patch (1 patch Transdermal Patch Applied 07/10/19 1250)  sodium chloride 0.9 % bolus 500 mL (0 mLs Intravenous Hold 07/10/19 1440)  acetaminophen (TYLENOL) tablet 1,000 mg (1,000 mg Oral Given 07/10/19 1330)     Initial Impression / Assessment and Plan / ED Course  I have reviewed the triage vital signs and the nursing notes.  Pertinent labs & imaging results that were available during my care of the patient were reviewed by me and considered in my medical decision making (see chart for details).  Clinical Course as of Jul 09 1748  Nancy Fetter Jul 10, 2019  1350 Pt has CKD. Unclear how acute on chronic this is today.   Creatinine(!): 2.21 [AM]    Clinical Course User Index [AM] Albesa Seen, PA-C  This is a well-appearing 58 year old male with a past medical history of type 2 diabetes mellitus, renal cell carcinoma status post left nephrectomy in April 2020, no metastatic disease identified, hypertension, heart failure with preserved ejection fraction, MI, TIA presenting for right shoulder pain, and mass in the right axilla.  Pain is with motion and seems very musculoskeletal in origin.  He does have a slightly enlarging soft tissue mass of the right axilla.  Does not appear infectious in etiology as it is not erythematous,  indurated, warm or fluctuant.  Spoke with radiologist and will obtain CT right shoulder and include the right axilla the same time.  Will check labs to see if patient can receive contrast.  Creatinine returns at 2.21.  This is slightly above patient's baseline established over the last couple months of 2.  Patient has good urine output here in the emergency department.  Nephrotoxic medications were stopped on his previous hospital admission 2 months ago.  It appears that patient is taking NSAIDs at times when he has severe right shoulder pain.  I counseled against this as these are nephrotoxic.  CT right shoulder reveals no lytic lesions or acute bony abnormality of the right shoulder.  He does have AC joint tendinitis.  The rotator cuff tendons are grossly intact.  The soft tissue mass in his right axilla appears to be a combination of lipoma and a chronic hematoma of unclear etiology, but no grossly worrisome findings for tumor or infection.  Patient was treated with Lidoderm patch, Tylenol in the emergency department.  Patient does have a pain contract and is unable to receive opiate prescriptions outside of this contract.  Encouraged patient to use Tylenol and the Lidoderm patches as well as ice on the hematoma until he gets his prescription in 2 to 3 days.  Patient was referred to orthopedics as well as nephrology.  Return precautions given for any increasing pain, swelling, erythema or fever or chills.  Patient is in understanding and agrees with the plan of care.  Final Clinical Impressions(s) / ED Diagnoses   Final diagnoses:  Chronic right shoulder pain  Nontraumatic hematoma of soft tissue  Stage 3 chronic kidney disease [facility])    ED Discharge Orders         Ordered    Ambulatory referral to Nephrology    Comments: Pt has solitary kidney s/p RCC of left kidney.   07/10/19 1607    lidocaine (LIDODERM) 5 %  Every 24 hours     07/10/19 1617           Albesa Seen, PA-C  07/10/19 [place], [place], DO 07/11/19 0901

## 2019-07-10 NOTE — ED Notes (Signed)
Awaiting reeval

## 2019-07-10 NOTE — ED Notes (Signed)
Dr McM in to assess 

## 2019-07-12 ENCOUNTER — Encounter: Payer: Self-pay | Admitting: Cardiovascular Disease

## 2019-07-12 ENCOUNTER — Ambulatory Visit: Payer: Medicaid Other | Admitting: Cardiovascular Disease

## 2019-07-12 ENCOUNTER — Other Ambulatory Visit: Payer: Self-pay

## 2019-07-12 VITALS — BP 141/89 | HR 71 | Ht 78.0 in | Wt 268.0 lb

## 2019-07-12 DIAGNOSIS — N183 Chronic kidney disease, stage 3 unspecified: Secondary | ICD-10-CM

## 2019-07-12 DIAGNOSIS — E785 Hyperlipidemia, unspecified: Secondary | ICD-10-CM

## 2019-07-12 DIAGNOSIS — I428 Other cardiomyopathies: Secondary | ICD-10-CM

## 2019-07-12 DIAGNOSIS — E119 Type 2 diabetes mellitus without complications: Secondary | ICD-10-CM

## 2019-07-12 DIAGNOSIS — I10 Essential (primary) hypertension: Secondary | ICD-10-CM

## 2019-07-12 NOTE — Patient Instructions (Signed)
Medication Instructions:  Your physician recommends that you continue on your current medications as directed. Please refer to the Current Medication list given to you today.  If you need a refill on your cardiac medications before your next appointment, please call your pharmacy.   Lab work: Your provider would like for you to have the following labs today: CMET, Lipid and A1C  If you have labs (blood work) drawn today and your tests are completely normal, you will receive your results only by: [place] (if you have MyChart) OR A paper copy in the mail If you have any lab test that is abnormal or we need to change your treatment, we will call you to review the results.  Testing/Procedures: None ordered  Follow-Up: At [facility], you and your health needs are our priority.  As part of our continuing mission to provide you with exceptional heart care, we have created designated Provider Care Teams.  These Care Teams include your primary Cardiologist (physician) and Advanced Practice Providers (APPs -  Physician Assistants and Nurse Practitioners) who all work together to provide you with the care you need, when you need it. You will need a follow up appointment in 12 months.  Please call our office 2 months in advance to schedule this appointment.  You may see Sanda Klein, MD or one of the following Advanced Practice Providers on your designated Care Team: Almyra Deforest, PA-C Fabian Sharp, [place]

## 2019-07-12 NOTE — Progress Notes (Signed)
Cardiology Office Note:    Date:  07/12/2019   ID:  [name]., DOB 04/22/1961, MRN [id number]  PCP:  Lucia Gaskins, MD  Cardiologist:  Sanda Klein, MD   Primary MD: Lucia Gaskins, MD   Chief Complaint  Patient presents with   Cardiomyopathy   Hypertension    History of Present Illness:    Raymond Barrera. is a 58 y.o. male with a hx of long-standing hypertension and diabetes mellitus (not requiring insulin) and hypercholesterolemia (on statin), previous ischemic stroke (March 2018) s/p complex lumbar spine surgery, moderate CKD following nephrectomy.  In March 2018 he had a left internal capsule ischemic stroke and he has mild right hand paresis as well as some difficulty with speech and poor short-term memory.  Prior to that in 2014 he had multiple "mini strokes" in the setting of severe hypertension.    Chronically low LVEF (45% in 2014) was markedly worse in July 2019 (30-35% by echo, 25% by QGS), but the nuclear perfusion pattern was normal. After treatment with Entresto, LVEF recovered to 50-55%, but Entresto was stopped due to renal dysfunction and symptomatic orthostatic hypotension, not long after his nephrectomy fro renal cell carcinoma in late April 2020.. As he lost weight, labetalol was decreased. He had severe dizziness, was briefly hospitalized for syncope in May 2020, but the event monitor in June 2020 showed no arrhythmia during dizzy spells.  He has good exercise tolerance, NYHA class 1-2, no edema, no syncope, palpitations, angina, new neuro deficits or claudication.  Crestor was started in May 2020.  The patient has never undergone cardiac catheterization.  On my review of a chest CT angiogram performed March 2018 (performed for "rule out pulmonary embolism"), there is a limited coronary atherosclerotic calcification, primarily seen in the mid LAD artery.  The ascending aorta is mildly dilated at 4.2 cm.  ECG shows normal sinus rhythm with  prominent voltage for left ventricular hypertrophy.  There is T wave inversion seen prominently in leads V4-V6, 1, 2, aVL.    He has not had any alcohol in 30 years.  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  He is currently smoking 1/4 pack of cigarettes a day, but smoked about a pack a day for over 30 years.  Diabetes control is borderline, last Hgb A1c 7.1%.Marland Kitchen  He is taking a statin.    He has had multiple surgical procedures in the last for 5 years such as hip replacement, ankle surgery and cervical spine surgery and lumbar spine surgery.  ECHO 02/02/2017 - Left ventricle: The cavity size was mildly dilated. Wall   thickness was increased in a pattern of moderate LVH. Systolic   function was moderately to severely reduced. The estimated   ejection fraction was in the range of 30% to 35%. Diffuse   hypokinesis. Doppler parameters are consistent with abnormal left   ventricular relaxation (grade 1 diastolic dysfunction). - Aortic valve: Mildly calcified annulus. Trileaflet; mildly   thickened leaflets. Valve area (VTI): 2.47 cm^2. Valve area   (Vmax): 2.45 cm^2. - Technically adequate study.  03/08/2013 - Left ventricle: The cavity size was normal. There was mild concentric hypertrophy. Systolic function was mildly reduced. The estimated ejection fraction was in the range of 45% to 50%. Diffuse hypokinesis. Doppler parameters are consistent with abnormal left ventricular relaxation (grade 1 diastolic dysfunction). - Left atrium: The atrium was mildly dilated. - Right atrium: The atrium was mildly dilated. -  Atrial septum: No defect or patent foramen ovale was identified.  04/19/2019   1. The left ventricle has low normal systolic function, with an ejection fraction of 50-55%. The cavity size was normal. There is mild concentric left ventricular hypertrophy. Left ventricular diastolic Doppler parameters are consistent with   pseudonormalization.  2. The right ventricle has normal systolic function. The cavity was normal. There is no increase in right ventricular wall thickness.  3. Left atrial size was moderately dilated.  4. There is moderate mitral annular calcification present.  5. The aortic valve is tricuspid. Moderate thickening of the aortic valve. Moderate sclerosis of the aortic valve. Aortic valve regurgitation was not assessed by color flow Doppler.  6. There is mild dilatation at the level of the sinuses of Valsalva measuring 39 mm and the ascending aorta at 36mm.  Past Medical History:  Diagnosis Date   Anxiety    due to the stroke   Arthritis    Back pain    hx of buldging disc   Diabetes mellitus without complication (HCC)    takes Metformin daily   High cholesterol    takes Zocor daily   Hypertension    takes Benazepril and HCTZ  daily   Joint pain    Joint swelling    Memory impairment    occassional - from stroke   Myocardial infarction ([place]) 1987   Pneumonia    hx of-80's   PONV (postoperative nausea and vomiting)    took a while for him to wake up after previous anesthesia   Shortness of breath dyspnea    do to pain   Sleep apnea    never had a sleep study,but states Dr. Cindie Laroche says he has it   Slurred speech    Stroke ([place]) 08/2013   7 mini-strokes, last stroke 2017   TIA (transient ischemic attack) 2014   x 7    Urinary frequency     Past Surgical History:  Procedure Laterality Date   ABDOMINAL EXPOSURE N/A 06/16/2018   Procedure: ABDOMINAL EXPOSURE;  Surgeon: Rosetta Posner, MD;  Location: [facility];  Service: Vascular;  Laterality: N/A;   ANKLE SURGERY  2008   left ankle-otif-[facility]   ANTERIOR LUMBAR FUSION Bilateral 06/16/2018   Procedure: LUMBAR 4-5 LUMBAR 5-SACRUM 1 ANTERIOR LUMBAR INTERBODY FUSION WITH INSTRUMENTATION AND ALLOGRAFT;  Surgeon: Phylliss Bob, MD;  Location: [facility];  Service: Orthopedics;  Laterality: Bilateral;   BACK SURGERY      HEMATOMA EVACUATION Left 08/13/2018   Procedure: EVACUATION HEMATOMA LEFT ABDOMINAL WALL;  Surgeon: Rosetta Posner, MD;  Location: [facility];  Service: Vascular;  Laterality: Left;   IR RADIOLOGIST EVAL & MGMT  07/27/2018   IR US GUIDE BX ASP/DRAIN  07/14/2018   JOINT REPLACEMENT     both hips replaced    LUMBAR LAMINECTOMY/DECOMPRESSION MICRODISCECTOMY Right 10/17/2014   Procedure: LUMBAR LAMINECTOMY/DECOMPRESSION MICRODISCECTOMY 2 LEVELS;  Surgeon: [name], MD;  Location: [facility];  Service: Neurosurgery;  Laterality: Right;  Right L45 L5S1 laminectomy and foraminotomy   MASS EXCISION  09/13/2012   Procedure: EXCISION MASS;  Surgeon: Jamesetta So, MD;  Location: [facility];  Service: General;  Laterality: N/A;   ROBOTIC ASSITED PARTIAL NEPHRECTOMY Left 03/30/2019   Procedure: XI ROBOTIC ASSITED PARTIAL NEPHRECTOMY POSSIBLE RADICAL NEPHRECTOMY;  Surgeon: Ceasar Mons, MD;  Location: [facility];  Service: Urology;  Laterality: Left;   TONSILLECTOMY     TOTAL HIP ARTHROPLASTY Right 03/20/2015   TOTAL HIP ARTHROPLASTY  Right 03/20/2015   Procedure: TOTAL HIP ARTHROPLASTY ANTERIOR APPROACH;  Surgeon: Renette Butters, MD;  Location: [facility];  Service: Orthopedics;  Laterality: Right;   TOTAL HIP ARTHROPLASTY Left 01/08/2016   Procedure: TOTAL HIP ARTHROPLASTY ANTERIOR APPROACH;  Surgeon: Renette Butters, MD;  Location: [facility];  Service: Orthopedics;  Laterality: Left;    Current Medications: Current Meds  Medication Sig   acetaminophen (TYLENOL) 500 MG tablet Take 1,000 mg by mouth every 6 (six) hours as needed (for pain).   aspirin EC 81 MG tablet Take 81 mg by mouth daily.   docusate sodium (COLACE) 100 MG capsule Take 1 capsule (100 mg total) by mouth 2 (two) times daily.   gabapentin (NEURONTIN) 300 MG capsule Take 300 mg by mouth 3 (three) times daily.   glucose 4 GM chewable tablet Chew 1 tablet by mouth as needed for low blood sugar.   labetalol (NORMODYNE)  100 MG tablet Take 1 tablet (100 mg total) by mouth 2 (two) times daily.   lidocaine (LIDODERM) 5 % Place 1 patch onto the skin daily. Remove & Discard patch within 12 hours or as directed by MD   metFORMIN (GLUCOPHAGE) 500 MG tablet Take 500 mg by mouth 2 (two) times daily with a meal.   ondansetron (ZOFRAN) 4 MG tablet Take 1 tablet (4 mg total) by mouth every 8 (eight) hours as needed for nausea or vomiting.   Oxycodone HCl 10 MG TABS Take 1 tablet (10 mg total) by mouth 3 (three) times daily as needed (pain.). (Patient taking differently: Take 10 mg by mouth 3 (three) times daily as needed (for pain). )   rosuvastatin (CRESTOR) 20 MG tablet Take 1 tablet (20 mg total) by mouth at bedtime.   tiZANidine (ZANAFLEX) 4 MG tablet Take 2-4 mg by mouth 2 (two) times daily as needed for muscle spasms.      Allergies:   Poison ivy extract [poison ivy extract]   Social History   Socioeconomic History   Marital status: Married    Spouse name: Not on file   Number of children: Not on file   Years of education: Not on file   Highest education level: Not on file  Occupational History   Not on file  Social Needs   Financial resource strain: Not on file   Food insecurity    Worry: Not on file    Inability: Not on file   Transportation needs    Medical: Not on file    Non-medical: Not on file  Tobacco Use   Smoking status: Current Some Day Smoker    Packs/day: 0.25    Years: 47.00    Pack years: 11.75    Types: Cigarettes   Smokeless tobacco: Never Used   Tobacco comment: 2 cigarettes  Substance and Sexual Activity   Alcohol use: No    Comment: quit 2012   Drug use: Not Currently    Types: Cocaine    Comment: many yrs ago., last time- late 2016   Sexual activity: Never  Lifestyle   Physical activity    Days per week: Not on file    Minutes per session: Not on file   Stress: Not on file  Relationships   Social connections    Talks on phone: Not on file     Gets together: Not on file    Attends religious service: Not on file    Active member of club or organization: Not on file    Attends  meetings of clubs or organizations: Not on file    Relationship status: Not on file  Other Topics Concern   Not on file  Social History Narrative   Not on file     Family History: The patient's family history includes Heart disease in his father.  ROS:   Please see the history of present illness.     All other systems reviewed and are negative.  EKGs/Labs/Other Studies Reviewed:    The following studies were reviewed today: Echo May 2020 Event monitor June 2020  EKG:  EKG is not ordered today.    Recent Labs: 04/18/2019: ALT 21; B Natriuretic Peptide 6.9; TSH 0.432 07/10/2019: BUN 36; Creatinine, Ser 2.21; Hemoglobin 11.5; Platelets 173; Potassium 4.5; Sodium 138  Recent Lipid Panel    Component Value Date/Time   CHOL 228 (H) 04/18/2019 1818   TRIG 176 (H) 04/18/2019 1818   HDL 34 (L) 04/18/2019 1818   CHOLHDL 6.7 04/18/2019 1818   VLDL 35 04/18/2019 1818   LDLCALC 159 (H) 04/18/2019 1818    Physical Exam:    VS:  BP (!) 141/89    Pulse 71    Ht 6\' 6"  (1.981 m)    Wt 268 lb (121.6 kg)    SpO2 96%    BMI 30.97 kg/m     Wt Readings from Last 3 Encounters:  07/12/19 268 lb (121.6 kg)  07/10/19 250 lb (113.4 kg)  04/20/19 258 lb 14.4 oz (117.4 kg)      General: Alert, oriented x3, no distress, borderline obese, also muscular Head: no evidence of trauma, PERRL, EOMI, no exophtalmos or lid lag, no myxedema, no xanthelasma; normal ears, nose and oropharynx Neck: normal jugular venous pulsations and no hepatojugular reflux; brisk carotid pulses without delay and no carotid bruits Chest: clear to auscultation, no signs of consolidation by percussion or palpation, normal fremitus, symmetrical and full respiratory excursions Cardiovascular: normal position and quality of the apical impulse, regular rhythm, normal first and second heart  sounds, no murmurs, rubs, S4 gallop is present Abdomen: no tenderness or distention, no masses by palpation, no abnormal pulsatility or arterial bruits, normal bowel sounds, no hepatosplenomegaly Extremities: no clubbing, cyanosis or edema; 2+ radial, ulnar and brachial pulses bilaterally; 2+ right femoral, posterior tibial and dorsalis pedis pulses; 2+ left femoral, posterior tibial and dorsalis pedis pulses; no subclavian or femoral bruits Neurological: grossly nonfocal Psych: Normal mood and affect   ASSESSMENT:    1. NICM (nonischemic cardiomyopathy) ([place])   2. Essential hypertension   3. Hyperlipidemia, unspecified hyperlipidemia type   4. Diabetes mellitus type 2 in nonobese (HCC)   5. CKD (chronic kidney disease) stage 3, GFR 30-59 ml/min (HCC)    PLAN:    In order of problems listed above:   1. CMP: Clinically he appears to be euvolemic. NYHA class 1-2. Unclear why EF was so low in July 2019 (EF 25% by echo and nuclear study) and why it has improved so substantially (EF 50-55% by echo May 2020). Back to 2014 baseline LVEF. May have had myocarditis or maybe BP control was the key. He was on CHF meds, but we had to stop Entresto due to renal dysfunction. 2. HTN: Not well controlled today, but did not take meds today. Reports typically SBP at home is in 120s. No change to meds.. 3. HLP: Recheck lipid profile on rosuvastatin therapy. Target LDL <100, preferably <70. He is not fasting today. Will have labs in [place]. Caution with higher dose rosuvastatin in  renal failure. 4. DM: borderline control, A1c 7.1%. Reviewed healthier changes to diet that he can implement, importance of additional weight loss. 5. CKD 3:  Post surgical single kidney. He is a large man, quite muscular. Serum creatinine (2.2, up from 1.9 in May) looks worse than GFR (36). Avoid RAAS inhibitors. Discussed avoidance of contrast studies, NSAIDs, other nephrotoxic agents, excessive diuresis.Marland Kitchen 6. Recommend  Smoking  cessation.   Medication Adjustments/Labs and Tests Ordered: Current medicines are reviewed at length with the patient today.  Concerns regarding medicines are outlined above.  Orders Placed This Encounter  Procedures   Comprehensive metabolic panel   Lipid panel   Hemoglobin A1c   No orders of the defined types were placed in this encounter.   Patient Instructions  Medication Instructions:  Your physician recommends that you continue on your current medications as directed. Please refer to the Current Medication list given to you today.  If you need a refill on your cardiac medications before your next appointment, please call your pharmacy.   Lab work: Your provider would like for you to have the following labs today: CMET, Lipid and A1C  If you have labs (blood work) drawn today and your tests are completely normal, you will receive your results only by: [place] (if you have MyChart) OR A paper copy in the mail If you have any lab test that is abnormal or we need to change your treatment, we will call you to review the results.  Testing/Procedures: None ordered  Follow-Up: At [facility], you and your health needs are our priority.  As part of our continuing mission to provide you with exceptional heart care, we have created designated Provider Care Teams.  These Care Teams include your primary Cardiologist (physician) and Advanced Practice Providers (APPs -  Physician Assistants and Nurse Practitioners) who all work together to provide you with the care you need, when you need it. You will need a follow up appointment in 12 months.  Please call our office 2 months in advance to schedule this appointment.  You may see Sanda Klein, MD or one of the following Advanced Practice Providers on your designated Care Team: Almyra Deforest, PA-C Fabian Sharp, PA-C         Signed, Sanda Klein, MD  07/12/2019 9:09 PM    [place]

## 2019-07-21 ENCOUNTER — Ambulatory Visit: Payer: Medicaid Other | Admitting: Orthopaedic Surgery

## 2019-07-21 ENCOUNTER — Other Ambulatory Visit: Payer: Self-pay

## 2019-07-21 ENCOUNTER — Encounter: Payer: Self-pay | Admitting: Orthopaedic Surgery

## 2019-07-21 VITALS — BP 142/99 | HR 80 | Temp 96.6°F | Ht 78.0 in | Wt 267.2 lb

## 2019-07-21 DIAGNOSIS — M25511 Pain in right shoulder: Secondary | ICD-10-CM

## 2019-07-21 DIAGNOSIS — Z905 Acquired absence of kidney: Secondary | ICD-10-CM | POA: Diagnosis not present

## 2019-07-21 DIAGNOSIS — F17210 Nicotine dependence, cigarettes, uncomplicated: Secondary | ICD-10-CM | POA: Diagnosis not present

## 2019-07-21 DIAGNOSIS — G8929 Other chronic pain: Secondary | ICD-10-CM

## 2019-07-21 NOTE — Patient Instructions (Signed)

## 2019-07-21 NOTE — Progress Notes (Signed)
Subjective:    Patient ID: Raymond Barrera., male    DOB: [birth year], 58 y.o.   MRN: [id number]  HPI He has long history of right shoulder pain that is getting worse.  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  He had CT of the shoulder done in the hospital on 07-10-2019 showing: IMPRESSION: 1. No acute osseous findings at the right shoulder. Mild acromioclavicular degenerative changes. 2. 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 No aggressive characteristics. 3. No focal muscular abnormalities identified.  He has pain of the right shoulder with overhead use and pain when sleeping. He has no swelling or redness. He has no numbness.  It is not getting any better.  He has used ice, heat and rubs.  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  His wife accompanies him.  He smokes.   Review of Systems  Constitutional: Positive for activity change.  Musculoskeletal: Positive for arthralgias and myalgias.  Psychiatric/Behavioral: The patient is nervous/anxious.   All other systems reviewed and are negative.  For Review of Systems, all other systems reviewed and are negative.  The following is a summary of the past history medically, past history surgically, known current medicines, social history and family history.  This information is gathered electronically by the computer from prior information and documentation.  I review this each visit and have found including this information at this point in the chart is beneficial and informative.   Past Medical History:  Diagnosis Date  . Anxiety    due to the stroke  . Arthritis   . Back pain    hx of buldging disc  . Diabetes mellitus without complication ([place])    takes Metformin daily  . High cholesterol    takes Zocor daily  . Hypertension    takes Benazepril and HCTZ  daily  . Joint pain   . Joint swelling    . Memory impairment    occassional - from stroke  . Myocardial infarction ([place]) 1987  . Pneumonia    hx of-80's  . PONV (postoperative nausea and vomiting)    took a while for him to wake up after previous anesthesia  . Shortness of breath dyspnea    do to pain  . Sleep apnea    never had a sleep study,but states Dr. Cindie Laroche says he has it  . Slurred speech   . Stroke ([place]) 08/2013   7 mini-strokes, last stroke 2017  . TIA (transient ischemic attack) 2014   x 7   . Urinary frequency     Past Surgical History:  Procedure Laterality Date  . ABDOMINAL EXPOSURE N/A 06/16/2018   Procedure: ABDOMINAL EXPOSURE;  Surgeon: Rosetta Posner, MD;  Location: [facility];  Service: Vascular;  Laterality: N/A;  . ANKLE SURGERY  2008   left ankle-otif-[facility]  . ANTERIOR LUMBAR FUSION Bilateral 06/16/2018   Procedure: LUMBAR 4-5 LUMBAR 5-SACRUM 1 ANTERIOR LUMBAR INTERBODY FUSION WITH INSTRUMENTATION AND ALLOGRAFT;  Surgeon: Phylliss Bob, MD;  Location: [facility];  Service: Orthopedics;  Laterality: Bilateral;  . BACK SURGERY    . HEMATOMA EVACUATION Left 08/13/2018   Procedure: EVACUATION HEMATOMA LEFT ABDOMINAL WALL;  Surgeon: Rosetta Posner, MD;  [facility];  Service: Vascular;  Laterality: Left;  . IR RADIOLOGIST EVAL & MGMT  07/27/2018  . IR US GUIDE BX ASP/DRAIN  07/14/2018  . JOINT REPLACEMENT     both hips replaced   . LUMBAR LAMINECTOMY/DECOMPRESSION MICRODISCECTOMY Right 10/17/2014   Procedure: LUMBAR LAMINECTOMY/DECOMPRESSION MICRODISCECTOMY 2 LEVELS;  Surgeon: [name], MD;  Location: [facility];  Service: Neurosurgery;  Laterality: Right;  Right L45 L5S1 laminectomy and foraminotomy  . MASS EXCISION  09/13/2012   Procedure: EXCISION MASS;  Surgeon: Jamesetta So, MD;  [facility]: [facility];  Service: General;  Laterality: N/A;  . ROBOTIC ASSITED PARTIAL NEPHRECTOMY Left 03/30/2019   Procedure: XI ROBOTIC ASSITED PARTIAL NEPHRECTOMY POSSIBLE RADICAL NEPHRECTOMY;  Surgeon: Ceasar Mons, MD;  [facility];  Service: Urology;  Laterality: Left;  . TONSILLECTOMY    . TOTAL HIP ARTHROPLASTY Right 03/20/2015  . TOTAL HIP ARTHROPLASTY Right 03/20/2015   Procedure: TOTAL HIP ARTHROPLASTY ANTERIOR APPROACH;  Surgeon: Renette Butters, MD;  Location: [facility];  Service: Orthopedics;  Laterality: Right;  . TOTAL HIP ARTHROPLASTY Left 01/08/2016   Procedure: TOTAL HIP ARTHROPLASTY ANTERIOR APPROACH;  Surgeon: Renette Butters, MD;  Location: [facility];  Service: Orthopedics;  Laterality: Left;    Current Outpatient Medications on File Prior to Visit  Medication Sig Dispense Refill  . acetaminophen (TYLENOL) 500 MG tablet Take 1,000 mg by mouth every 6 (six) hours as needed (for pain).    Marland Kitchen aspirin EC 81 MG tablet Take 81 mg by mouth daily.    Marland Kitchen docusate sodium (COLACE) 100 MG capsule Take 1 capsule (100 mg total) by mouth 2 (two) times daily.    Marland Kitchen gabapentin (NEURONTIN) 300 MG capsule Take 300 mg by mouth 3 (three) times daily.    Marland Kitchen glucose 4 GM chewable tablet Chew 1 tablet by mouth as needed for low blood sugar.    . labetalol (NORMODYNE) 100 MG tablet Take 1 tablet (100 mg total) by mouth 2 (two) times daily.    Marland Kitchen lidocaine (LIDODERM) 5 % Place 1 patch onto the skin daily. Remove & Discard patch within 12 hours or as directed by MD 30 patch 0  . metFORMIN (GLUCOPHAGE) 500 MG tablet Take 500 mg by mouth 2 (two) times daily with a meal.    . ondansetron (ZOFRAN) 4 MG tablet Take 1 tablet (4 mg total) by mouth every 8 (eight) hours as needed for nausea or vomiting. 20 tablet 0  . Oxycodone HCl 10 MG TABS Take 1 tablet (10 mg total) by mouth 3 (three) times daily as needed (pain.). (Patient taking differently: Take 10 mg by mouth 3 (three) times daily as needed (for pain). ) 10 tablet 0  . rosuvastatin (CRESTOR) 20 MG tablet Take 1 tablet (20 mg total) by mouth at bedtime. 30 tablet 0  . tiZANidine (ZANAFLEX) 4 MG tablet Take 2-4 mg by mouth 2 (two) times daily as needed for  muscle spasms.      No current facility-administered medications on file prior to visit.     Social History   Socioeconomic History  . Marital status: Married    Spouse name: Not on file  . Number of children: Not on file  . Years of education: Not on file  . Highest education level: Not on file  Occupational History  . Not on file  Social Needs  . Financial resource strain: Not on file  . Food insecurity    Worry: Not on file    Inability: Not on file  . Transportation needs    Medical: Not on file    Non-medical: Not on file  Tobacco Use  . Smoking status: Current Some Day Smoker    Packs/day: 0.25    Years: 47.00    Pack years: 11.75    Types: Cigarettes  . Smokeless tobacco: Never Used  . Tobacco comment: 2 cigarettes  Substance and Sexual Activity  . Alcohol use: No    Comment: quit 2012  . Drug use: Not Currently    Types: Cocaine    Comment: many yrs ago., last time- late 2016  . Sexual activity: Never  Lifestyle  . Physical activity    Days per week: Not on file    Minutes per session: Not on file  . Stress: Not on file  Relationships  . Social Herbalist on phone: Not on file    Gets together: Not on file    Attends religious service: Not on file    Active member of club or organization: Not on file    Attends meetings of clubs or organizations: Not on file    Relationship status: Not on file  . Intimate partner violence    Fear of current or ex partner: Not on file    Emotionally abused: Not on file    Physically abused: Not on file    Forced sexual activity: Not on file  Other Topics Concern  . Not on file  Social History Narrative  . Not on file    Family History  Problem Relation Age of Onset  . Heart disease Father     BP (!) 142/99   Pulse 80   Temp (!) 96.6 F (35.9 C)   Ht 6\' 6"  (1.981 m)   Wt 267 lb 4 oz (121.2 kg)   BMI 30.88 kg/m   Body mass index is 30.88 kg/m.     Objective:   Physical Exam Vitals signs  reviewed.  Constitutional:      Appearance: He is well-developed.  HENT:     Head: Normocephalic and atraumatic.  Eyes:     Conjunctiva/sclera: Conjunctivae normal.     Pupils: Pupils are equal, round, and reactive to light.  Neck:     Musculoskeletal: Normal range of motion and neck supple.  Cardiovascular:     Rate and Rhythm: Normal rate and regular rhythm.  Pulmonary:     Effort: Pulmonary effort is normal.  Abdominal:     Palpations: Abdomen is soft.  Musculoskeletal:     Right shoulder: He exhibits decreased range of motion and tenderness.       Arms:  Skin:    General: Skin is warm and dry.  Neurological:     Mental Status: He is alert and oriented to person, place, and time.     Cranial Nerves: No cranial nerve deficit.     Motor: No abnormal muscle tone.     Coordination: Coordination normal.     Deep Tendon Reflexes: Reflexes are normal and symmetric. Reflexes normal.  Psychiatric:        Behavior: Behavior normal.        Thought Content: Thought content normal.        Judgment: Judgment normal.           Assessment & Plan:   Encounter Diagnoses  Name Primary?  . Chronic right shoulder pain Yes  . History of kidney removal   . Nicotine dependence, cigarettes, uncomplicated    PROCEDURE NOTE:  The patient request injection, verbal consent was obtained.  The right shoulder was prepped appropriately after time out was  performed.   Sterile technique was observed and injection of 1 cc of Depo-Medrol 40 mg with several cc's of plain xylocaine. Anesthesia was provided by ethyl chloride and a 20-gauge needle was used to inject the shoulder area. A posterior approach was used.  The injection was tolerated well.  A band aid dressing was applied.  The patient was advised to apply ice later today and tomorrow to the injection sight as needed.  I will see him in two weeks.  Use rubs to the shoulder.  Call if any problem.  Precautions discussed.    Electronically Signed Sanjuana Kava, MD 8/20/202010:47 AM

## 2019-08-04 ENCOUNTER — Ambulatory Visit: Payer: Medicaid Other | Admitting: Orthopaedic Surgery

## 2019-08-09 ENCOUNTER — Encounter: Payer: Self-pay | Admitting: Orthopaedic Surgery

## 2019-08-16 ENCOUNTER — Telehealth: Payer: Self-pay | Admitting: Radiology

## 2019-08-16 ENCOUNTER — Ambulatory Visit (INDEPENDENT_AMBULATORY_CARE_PROVIDER_SITE_OTHER): Payer: Medicaid Other | Admitting: Orthopaedic Surgery

## 2019-08-16 ENCOUNTER — Other Ambulatory Visit: Payer: Self-pay

## 2019-08-16 ENCOUNTER — Encounter: Payer: Self-pay | Admitting: Orthopaedic Surgery

## 2019-08-16 VITALS — BP 147/101 | HR 73 | Ht 78.0 in | Wt 267.0 lb

## 2019-08-16 DIAGNOSIS — M25511 Pain in right shoulder: Secondary | ICD-10-CM

## 2019-08-16 DIAGNOSIS — Z905 Acquired absence of kidney: Secondary | ICD-10-CM

## 2019-08-16 DIAGNOSIS — G8929 Other chronic pain: Secondary | ICD-10-CM

## 2019-08-16 DIAGNOSIS — F17210 Nicotine dependence, cigarettes, uncomplicated: Secondary | ICD-10-CM

## 2019-08-16 NOTE — Progress Notes (Signed)
Patient TD:[id number] Raymond Barrera., male DOB:[birth year], 58 y.o. [id number]  Chief Complaint  Patient presents with  . Shoulder Pain    right very painful cant sleep     HPI  Raymond Barrera. is a 58 y.o. male who has continued pain of the right shoulder.  He said the injection did not help.  He has more pain at night. He has no new trauma.  He says the "knot" is hurting more and more.  He has pain with overhead use.  He has no numbness.  I will get MRI of the right shoulder with and without contrast to fully evaluate what I assume is a lipoma in the shoulder area.   Body mass index is 30.85 kg/m.  ROS  Review of Systems  Constitutional: Positive for activity change.  Musculoskeletal: Positive for arthralgias and myalgias.  Psychiatric/Behavioral: The patient is nervous/anxious.   All other systems reviewed and are negative.   All other systems reviewed and are negative.  The following is a summary of the past history medically, past history surgically, known current medicines, social history and family history.  This information is gathered electronically by the computer from prior information and documentation.  I review this each visit and have found including this information at this point in the chart is beneficial and informative.    Past Medical History:  Diagnosis Date  . Anxiety    due to the stroke  . Arthritis   . Back pain    hx of buldging disc  . Diabetes mellitus without complication ([place])    takes Metformin daily  . High cholesterol    takes Zocor daily  . Hypertension    takes Benazepril and HCTZ  daily  . Joint pain   . Joint swelling   . Memory impairment    occassional - from stroke  . Myocardial infarction ([place]) 1987  . Pneumonia    hx of-80's  . PONV (postoperative nausea and vomiting)    took a while for him to wake up after previous anesthesia  . Shortness of breath dyspnea    do to pain  . Sleep apnea    never had a sleep study,but  states Dr. Cindie Laroche says he has it  . Slurred speech   . Stroke ([place]) 08/2013   7 mini-strokes, last stroke 2017  . TIA (transient ischemic attack) 2014   x 7   . Urinary frequency     Past Surgical History:  Procedure Laterality Date  . ABDOMINAL EXPOSURE N/A 06/16/2018   Procedure: ABDOMINAL EXPOSURE;  Surgeon: Rosetta Posner, MD;  Location: [facility];  Service: Vascular;  Laterality: N/A;  . ANKLE SURGERY  2008   left ankle-otif-[facility]  . ANTERIOR LUMBAR FUSION Bilateral 06/16/2018   Procedure: LUMBAR 4-5 LUMBAR 5-SACRUM 1 ANTERIOR LUMBAR INTERBODY FUSION WITH INSTRUMENTATION AND ALLOGRAFT;  Surgeon: Phylliss Bob, MD;  Location: [facility];  Service: Orthopedics;  Laterality: Bilateral;  . BACK SURGERY    . HEMATOMA EVACUATION Left 08/13/2018   Procedure: EVACUATION HEMATOMA LEFT ABDOMINAL WALL;  Surgeon: Rosetta Posner, MD;  [facility];  Service: Vascular;  Laterality: Left;  . IR RADIOLOGIST EVAL & MGMT  07/27/2018  . IR US GUIDE BX ASP/DRAIN  07/14/2018  . JOINT REPLACEMENT     both hips replaced   . LUMBAR LAMINECTOMY/DECOMPRESSION MICRODISCECTOMY Right 10/17/2014   Procedure: LUMBAR LAMINECTOMY/DECOMPRESSION MICRODISCECTOMY 2 LEVELS;  Surgeon: [name], MD;  Location: [facility];  Service: Neurosurgery;  Laterality: Right;  Right L45 L5S1 laminectomy and foraminotomy  . MASS EXCISION  09/13/2012   Procedure: EXCISION MASS;  Surgeon: Jamesetta So, MD;  [facility];  Service: General;  Laterality: N/A;  . ROBOTIC ASSITED PARTIAL NEPHRECTOMY Left 03/30/2019   Procedure: XI ROBOTIC ASSITED PARTIAL NEPHRECTOMY POSSIBLE RADICAL NEPHRECTOMY;  Surgeon: Ceasar Mons, MD;  [facility];  Service: Urology;  Laterality: Left;  . TONSILLECTOMY    . TOTAL HIP ARTHROPLASTY Right 03/20/2015  . TOTAL HIP ARTHROPLASTY Right 03/20/2015   Procedure: TOTAL HIP ARTHROPLASTY ANTERIOR APPROACH;  Surgeon: Renette Butters, MD;  Location: [facility];  Service: Orthopedics;   Laterality: Right;  . TOTAL HIP ARTHROPLASTY Left 01/08/2016   Procedure: TOTAL HIP ARTHROPLASTY ANTERIOR APPROACH;  Surgeon: Renette Butters, MD;  Location: [facility];  Service: Orthopedics;  Laterality: Left;    Family History  Problem Relation Age of Onset  . Heart disease Father     Social History Social History   Tobacco Use  . Smoking status: Current Some Day Smoker    Packs/day: 0.25    Years: 47.00    Pack years: 11.75    Types: Cigarettes  . Smokeless tobacco: Never Used  . Tobacco comment: 2 cigarettes  Substance Use Topics  . Alcohol use: No    Comment: quit 2012  . Drug use: Not Currently    Types: Cocaine    Comment: many yrs ago., last time- late 2016    Allergies  Allergen Reactions  . Poison Ivy Extract [Poison Ivy Extract] Itching and Rash    Current Outpatient Medications  Medication Sig Dispense Refill  . acetaminophen (TYLENOL) 500 MG tablet Take 1,000 mg by mouth every 6 (six) hours as needed (for pain).    Marland Kitchen aspirin EC 81 MG tablet Take 81 mg by mouth daily.    Marland Kitchen gabapentin (NEURONTIN) 300 MG capsule Take 300 mg by mouth 3 (three) times daily.    Marland Kitchen glucose 4 GM chewable tablet Chew 1 tablet by mouth as needed for low blood sugar.    . labetalol (NORMODYNE) 100 MG tablet Take 1 tablet (100 mg total) by mouth 2 (two) times daily.    Marland Kitchen lidocaine (LIDODERM) 5 % Place 1 patch onto the skin daily. Remove & Discard patch within 12 hours or as directed by MD 30 patch 0  . metFORMIN (GLUCOPHAGE) 500 MG tablet Take 500 mg by mouth 2 (two) times daily with a meal.    . ondansetron (ZOFRAN) 4 MG tablet Take 1 tablet (4 mg total) by mouth every 8 (eight) hours as needed for nausea or vomiting. 20 tablet 0  . Oxycodone HCl 10 MG TABS Take 1 tablet (10 mg total) by mouth 3 (three) times daily as needed (pain.). (Patient taking differently: Take 10 mg by mouth 3 (three) times daily as needed (for pain). ) 10 tablet 0  . rosuvastatin (CRESTOR) 20 MG tablet Take 1 tablet  (20 mg total) by mouth at bedtime. 30 tablet 0  . tiZANidine (ZANAFLEX) 4 MG tablet Take 2-4 mg by mouth 2 (two) times daily as needed for muscle spasms.     Marland Kitchen docusate sodium (COLACE) 100 MG capsule Take 1 capsule (100 mg total) by mouth 2 (two) times daily.     No current facility-administered medications for this visit.      Physical Exam  Blood pressure (!) 147/101, pulse 73, height 6\' 6"  (1.981 m), weight 267 lb (121.1 kg).  Constitutional: overall normal  hygiene, normal nutrition, well developed, normal grooming, normal body habitus. Assistive device:none  Musculoskeletal: gait and station Limp none, muscle tone and strength are normal, no tremors or atrophy is present.  .  Neurological: coordination overall normal.  Deep tendon reflex/nerve stretch intact.  Sensation normal.  Cranial nerves II-XII intact.   Skin:   Normal overall no scars, lesions, ulcers or rashes. No psoriasis.  Psychiatric: Alert and oriented x 3.  Recent memory intact, remote memory unclear.  Normal mood and affect. Well groomed.  Good eye contact.  Cardiovascular: overall no swelling, no varicosities, no edema bilaterally, normal temperatures of the legs and arms, no clubbing, cyanosis and good capillary refill.  Lymphatic: palpation is normal.  Right shoulder has decreased motion with pain in almost any direction.  He has a large soft tissue mass of the axilla area.  NV intact.  Grips normal. ROM of neck is full.  All other systems reviewed and are negative   The patient has been educated about the nature of the problem(s) and counseled on treatment options.  The patient appeared to understand what I have discussed and is in agreement with it.  Encounter Diagnoses  Name Primary?  . Chronic right shoulder pain Yes  . History of kidney removal   . Nicotine dependence, cigarettes, uncomplicated     PLAN Call if any problems.  Precautions discussed.  Continue current medications.   Return to clinic  3 weeks   Get MRI of the shoulder on the right.  Electronically Signed Sanjuana Kava, MD 9/15/202011:58 AM

## 2019-08-16 NOTE — Telephone Encounter (Signed)
Medicaid requires him to have xray prior to MRI, they do not count the CT as xray  Can he come in for the xray only one day so we can get MRI approved?

## 2019-08-17 NOTE — Telephone Encounter (Signed)
yes

## 2019-08-17 NOTE — Telephone Encounter (Signed)
I called patient to advise he will need right shoulder xray. Left message for him to call me back   He needs xray only

## 2019-08-24 NOTE — Telephone Encounter (Signed)
Unable to reach multiple attempts, have mailed letter. Will have to Cancel MRI if I do not hear from him soon.

## 2019-08-31 NOTE — Telephone Encounter (Signed)
Patient's wife called the office and left a message asking for a call back regarding Raymond Barrera getting xrays prior to the MRI.  Please call them at either phone number listed.  Thanks

## 2019-09-02 ENCOUNTER — Ambulatory Visit (HOSPITAL_COMMUNITY)
Admission: RE | Admit: 2019-09-02 | Discharge: 2019-09-02 | Disposition: A | Discharge status: Home / Self Care | Payer: Medicaid Other | Source: Ambulatory Visit | Attending: Urology | Admitting: Urology

## 2019-09-02 ENCOUNTER — Other Ambulatory Visit (HOSPITAL_COMMUNITY): Payer: Self-pay | Admitting: Urology

## 2019-09-02 ENCOUNTER — Other Ambulatory Visit: Payer: Self-pay

## 2019-09-02 DIAGNOSIS — C642 Malignant neoplasm of left kidney, except renal pelvis: Secondary | ICD-10-CM | POA: Diagnosis not present

## 2019-09-05 ENCOUNTER — Telehealth: Payer: Self-pay

## 2019-09-05 NOTE — Telephone Encounter (Signed)
I left another message to see if they can come in tomorrow pm 130 to 3 pm.

## 2019-09-05 NOTE — Telephone Encounter (Signed)
Tomorrow pm / between 130 and 3pm I have called to advise. Left message.

## 2019-09-05 NOTE — Telephone Encounter (Signed)
Message was left on voicemail 10/2 @ 3:51pm by his wife stating that patient needs to come in for xray before going for MRI on shoulder which is scheduled for 10/10.  She stated that the [phone] has voicemail if we need to leave message.

## 2019-09-06 ENCOUNTER — Other Ambulatory Visit: Payer: Self-pay

## 2019-09-06 ENCOUNTER — Ambulatory Visit (INDEPENDENT_AMBULATORY_CARE_PROVIDER_SITE_OTHER): Payer: Medicaid Other

## 2019-09-06 ENCOUNTER — Encounter: Payer: Medicaid Other | Admitting: Orthopaedic Surgery

## 2019-09-06 DIAGNOSIS — M25511 Pain in right shoulder: Secondary | ICD-10-CM

## 2019-09-06 DIAGNOSIS — G8929 Other chronic pain: Secondary | ICD-10-CM

## 2019-09-07 ENCOUNTER — Encounter (HOSPITAL_COMMUNITY): Payer: Self-pay | Admitting: Emergency Medicine

## 2019-09-07 ENCOUNTER — Emergency Department (HOSPITAL_COMMUNITY)
Admission: EM | Admit: 2019-09-07 | Discharge: 2019-09-07 | Disposition: A | Discharge status: Home / Self Care | Payer: Medicaid Other | Attending: Emergency Medicine | Admitting: Emergency Medicine

## 2019-09-07 ENCOUNTER — Other Ambulatory Visit: Payer: Self-pay

## 2019-09-07 ENCOUNTER — Emergency Department (HOSPITAL_COMMUNITY): Payer: Medicaid Other

## 2019-09-07 DIAGNOSIS — Z905 Acquired absence of kidney: Secondary | ICD-10-CM | POA: Insufficient documentation

## 2019-09-07 DIAGNOSIS — X509XXA Other and unspecified overexertion or strenuous movements or postures, initial encounter: Secondary | ICD-10-CM | POA: Diagnosis not present

## 2019-09-07 DIAGNOSIS — I110 Hypertensive heart disease with heart failure: Secondary | ICD-10-CM | POA: Insufficient documentation

## 2019-09-07 DIAGNOSIS — F17210 Nicotine dependence, cigarettes, uncomplicated: Secondary | ICD-10-CM | POA: Diagnosis not present

## 2019-09-07 DIAGNOSIS — S40011A Contusion of right shoulder, initial encounter: Secondary | ICD-10-CM

## 2019-09-07 DIAGNOSIS — I5022 Chronic systolic (congestive) heart failure: Secondary | ICD-10-CM | POA: Insufficient documentation

## 2019-09-07 DIAGNOSIS — Y92007 Garden or yard of unspecified non-institutional (private) residence as the place of occurrence of the external cause: Secondary | ICD-10-CM | POA: Diagnosis not present

## 2019-09-07 DIAGNOSIS — Y999 Unspecified external cause status: Secondary | ICD-10-CM | POA: Diagnosis not present

## 2019-09-07 DIAGNOSIS — Z79899 Other long term (current) drug therapy: Secondary | ICD-10-CM | POA: Diagnosis not present

## 2019-09-07 DIAGNOSIS — Z96643 Presence of artificial hip joint, bilateral: Secondary | ICD-10-CM | POA: Insufficient documentation

## 2019-09-07 DIAGNOSIS — Y93H9 Activity, other involving exterior property and land maintenance, building and construction: Secondary | ICD-10-CM | POA: Diagnosis not present

## 2019-09-07 DIAGNOSIS — E119 Type 2 diabetes mellitus without complications: Secondary | ICD-10-CM | POA: Insufficient documentation

## 2019-09-07 DIAGNOSIS — S4991XA Unspecified injury of right shoulder and upper arm, initial encounter: Secondary | ICD-10-CM | POA: Diagnosis present

## 2019-09-07 DIAGNOSIS — Z7984 Long term (current) use of oral hypoglycemic drugs: Secondary | ICD-10-CM | POA: Diagnosis not present

## 2019-09-07 NOTE — Telephone Encounter (Signed)
He came in yesterday for Xray  Can you please read the xray so I can get approval for the MRI from Medicaid?   Thanks

## 2019-09-07 NOTE — Telephone Encounter (Signed)
Have put in prior auth request/ pending

## 2019-09-07 NOTE — Discharge Instructions (Signed)
Follow up with your Orthopaedist.   Return if any problems.

## 2019-09-07 NOTE — Telephone Encounter (Signed)
I have read the films.  MRI can now be obtained.  Thanks.

## 2019-09-07 NOTE — ED Triage Notes (Signed)
RT shoulder pain after lawn mower fell on top of his shoulder today.

## 2019-09-08 ENCOUNTER — Emergency Department (HOSPITAL_COMMUNITY)
Admission: EM | Admit: 2019-09-08 | Discharge: 2019-09-08 | Disposition: A | Discharge status: Home / Self Care | Payer: Medicaid Other | Attending: Emergency Medicine | Admitting: Emergency Medicine

## 2019-09-08 ENCOUNTER — Other Ambulatory Visit: Payer: Self-pay

## 2019-09-08 ENCOUNTER — Encounter (HOSPITAL_COMMUNITY): Payer: Self-pay | Admitting: Emergency Medicine

## 2019-09-08 DIAGNOSIS — E119 Type 2 diabetes mellitus without complications: Secondary | ICD-10-CM | POA: Diagnosis not present

## 2019-09-08 DIAGNOSIS — M25511 Pain in right shoulder: Secondary | ICD-10-CM

## 2019-09-08 DIAGNOSIS — F17210 Nicotine dependence, cigarettes, uncomplicated: Secondary | ICD-10-CM | POA: Diagnosis not present

## 2019-09-08 DIAGNOSIS — I110 Hypertensive heart disease with heart failure: Secondary | ICD-10-CM | POA: Insufficient documentation

## 2019-09-08 DIAGNOSIS — I5022 Chronic systolic (congestive) heart failure: Secondary | ICD-10-CM | POA: Diagnosis not present

## 2019-09-08 DIAGNOSIS — Z7984 Long term (current) use of oral hypoglycemic drugs: Secondary | ICD-10-CM | POA: Insufficient documentation

## 2019-09-08 DIAGNOSIS — Z79899 Other long term (current) drug therapy: Secondary | ICD-10-CM | POA: Insufficient documentation

## 2019-09-08 MED ORDER — KETOROLAC TROMETHAMINE 30 MG/ML IJ SOLN
30.0000 mg | Freq: Once | INTRAMUSCULAR | Status: AC
  Administered 2019-09-08: 08:00:00 30 mg via INTRAMUSCULAR
  Filled 2019-09-08: qty 1

## 2019-09-08 NOTE — ED Provider Notes (Signed)
Emergency Department Provider Note   I have reviewed the triage vital signs and the nursing notes.   HISTORY  Chief Complaint Shoulder Pain   HPI Raymond Barrera. is a 58 y.o. male with past medical history reviewed below including chronic right shoulder pain for which he follows with orthopedics as an outpatient presents with worsening pain in the past 24 hours.  He was seen in the emergency department yesterday after a lawnmower, which she was pushing up a ramp, fell over on him injuring his right shoulder.  He had x-rays in the emergency department yesterday and was ultimately discharged home.  He states that this morning he ran out of his 10 mg oxycodone tabs and does have a prescription but that he cannot get it filled until tomorrow.  He follows Dr. Primus Bravo and pain management and has an MRI of the shoulder scheduled for later this week.  He denies any new numbness/tingling.  No neck pain.  No fever or joint redness/swelling   Past Medical History:  Diagnosis Date  . Anxiety    due to the stroke  . Arthritis   . Back pain    hx of buldging disc  . Diabetes mellitus without complication ([place])    takes Metformin daily  . High cholesterol    takes Zocor daily  . Hypertension    takes Benazepril and HCTZ  daily  . Joint pain   . Joint swelling   . Memory impairment    occassional - from stroke  . Myocardial infarction ([place]) 1987  . Pneumonia    hx of-80's  . PONV (postoperative nausea and vomiting)    took a while for him to wake up after previous anesthesia  . Shortness of breath dyspnea    do to pain  . Sleep apnea    never had a sleep study,but states Dr. Cindie Laroche says he has it  . Slurred speech   . Stroke ([place]) 08/2013   7 mini-strokes, last stroke 2017  . TIA (transient ischemic attack) 2014   x 7   . Urinary frequency     Patient Active Problem List   Diagnosis Date Noted  . Syncope 04/18/2019  . Chronic systolic heart failure ([place])   . Pain of both  hip joints   . History of renal cell carcinoma   . Renal mass 03/30/2019  . Abdominal hematoma 08/13/2018  . Radiculopathy 06/16/2018  . Acute CVA (cerebrovascular accident) ([place]) 02/01/2017  . Diabetes mellitus ([place]) 02/01/2017  . HTN (hypertension) 02/01/2017  . Primary osteoarthritis of left hip 01/08/2016  . DJD (degenerative joint disease) 03/20/2015  . Primary osteoarthritis of right hip 02/26/2015  . Lumbar spondylosis 10/17/2014  . Lumbago 07/25/2014  . Abnormality of gait 07/25/2014  . Difficulty in walking(719.7) 07/25/2014  . TIA (transient ischemic attack) 03/09/2013  . Cocaine abuse ([place]) 03/09/2013  . Accelerated hypertension 03/09/2013  . Hyperlipidemia 03/09/2013  . Current smoker 03/09/2013    Past Surgical History:  Procedure Laterality Date  . ABDOMINAL EXPOSURE N/A 06/16/2018   Procedure: ABDOMINAL EXPOSURE;  Surgeon: Rosetta Posner, MD;  Location: [facility];  Service: Vascular;  Laterality: N/A;  . ANKLE SURGERY  2008   left ankle-otif-[facility]  . ANTERIOR LUMBAR FUSION Bilateral 06/16/2018   Procedure: LUMBAR 4-5 LUMBAR 5-SACRUM 1 ANTERIOR LUMBAR INTERBODY FUSION WITH INSTRUMENTATION AND ALLOGRAFT;  Surgeon: Phylliss Bob, MD;  Location: [facility];  Service: Orthopedics;  Laterality: Bilateral;  . BACK SURGERY    .  HEMATOMA EVACUATION Left 08/13/2018   Procedure: EVACUATION HEMATOMA LEFT ABDOMINAL WALL;  Surgeon: Rosetta Posner, MD;  [facility];  Service: Vascular;  Laterality: Left;  . IR RADIOLOGIST EVAL & MGMT  07/27/2018  . IR US GUIDE BX ASP/DRAIN  07/14/2018  . JOINT REPLACEMENT     both hips replaced   . LUMBAR LAMINECTOMY/DECOMPRESSION MICRODISCECTOMY Right 10/17/2014   Procedure: LUMBAR LAMINECTOMY/DECOMPRESSION MICRODISCECTOMY 2 LEVELS;  Surgeon: [name], MD;  Location: [facility];  Service: Neurosurgery;  Laterality: Right;  Right L45 L5S1 laminectomy and foraminotomy  . MASS EXCISION  09/13/2012   Procedure: EXCISION MASS;  Surgeon: Jamesetta So, MD;  Location: [facility];  Service: General;  Laterality: N/A;  . ROBOTIC ASSITED PARTIAL NEPHRECTOMY Left 03/30/2019   Procedure: XI ROBOTIC ASSITED PARTIAL NEPHRECTOMY POSSIBLE RADICAL NEPHRECTOMY;  Surgeon: Ceasar Mons, MD;  [facility]: [facility];  Service: Urology;  Laterality: Left;  . TONSILLECTOMY    . TOTAL HIP ARTHROPLASTY Right 03/20/2015  . TOTAL HIP ARTHROPLASTY Right 03/20/2015   Procedure: TOTAL HIP ARTHROPLASTY ANTERIOR APPROACH;  Surgeon: Renette Butters, MD;  Location: [facility];  Service: Orthopedics;  Laterality: Right;  . TOTAL HIP ARTHROPLASTY Left 01/08/2016   Procedure: TOTAL HIP ARTHROPLASTY ANTERIOR APPROACH;  Surgeon: Renette Butters, MD;  Location: [facility];  Service: Orthopedics;  Laterality: Left;    Allergies Poison ivy extract [poison ivy extract]  Family History  Problem Relation Age of Onset  . Heart disease Father     Social History Social History   Tobacco Use  . Smoking status: Current Some Day Smoker    Packs/day: 0.25    Years: 47.00    Pack years: 11.75    Types: Cigarettes  . Smokeless tobacco: Never Used  . Tobacco comment: 2 cigarettes  Substance Use Topics  . Alcohol use: No    Comment: quit 2012  . Drug use: Not Currently    Types: Cocaine    Comment: many yrs ago., last time- late 2016    Review of Systems  Constitutional: No fever/chills Musculoskeletal: Negative for back pain. Positive right shoulder pain. No neck pain.  Neurological: Negative for focal weakness or numbness.  10-point ROS otherwise negative.  ____________________________________________   PHYSICAL EXAM:  VITAL SIGNS: ED Triage Vitals  Enc Vitals Group     BP 09/08/19 0714 (!) 173/106     Pulse Rate 09/08/19 0714 70     Resp 09/08/19 0714 18     Temp 09/08/19 0714 98.4 F (36.9 C)     Temp Source 09/08/19 0714 Oral     SpO2 09/08/19 0714 97 %     Weight 09/08/19 0717 250 lb (113.4 kg)     Height 09/08/19 0717 6' (1.829 m)    Constitutional: Alert and oriented. Well appearing and in no acute distress. Eyes: Conjunctivae are normal.  Head: Atraumatic. Nose: No congestion/rhinnorhea. Mouth/Throat: Mucous membranes are moist. Neck: No stridor. No cervical spine tenderness.  Cardiovascular: Normal rate, regular rhythm. Strong radial pulse on the right.  Respiratory: Normal respiratory effort.   Gastrointestinal: No distention.  Musculoskeletal: Patient with limited ROM of the right shoulder 2/2 pain. No joint redness or warmth. No elbow or wrist tenderness.  Neurologic:  Normal speech and language. No gross focal neurologic deficits are appreciated.  Skin:  Skin is warm, dry and intact. No rash noted.  ____________________________________________  [id number]  Dg Shoulder Right  Result Date: 09/07/2019 CLINICAL DATA:  Shoulder pain after lawnmower fell  on top of them. EXAM: RIGHT SHOULDER - 2+ VIEW COMPARISON:  09/06/2019 FINDINGS: Two views study shows no fracture. No evidence for shoulder separation or dislocation. Degenerative changes noted at the acromioclavicular joint. IMPRESSION: Negative. Electronically Signed   By: Misty Stanley M.D.   On: 09/07/2019 17:33    ____________________________________________   PROCEDURES  Procedure(s) performed:   Procedures  None  ____________________________________________   INITIAL IMPRESSION / ASSESSMENT AND PLAN / ED COURSE  Pertinent labs & imaging results that were available during my care of the patient were reviewed by me and considered in my medical decision making (see chart for details).   Patient presents to the emergency department for evaluation of acute on chronic right shoulder pain.  He had x-rays yesterday in the emergency department.  I reviewed this plain films along with the note.  Patient appears to have run out of his oxycodone which she will get filled tomorrow.  I explained that additional prescriptions for that medication must come from  Dr. Primus Bravo and not from the ED. Patient verbalizes understanding. Will continue topical pain medication along with home medications for pain. Patient with MRI of the right shoulder scheduled as an outpatient.  No indication for additional acute imaging here.  No concern for septic joint.  No findings on exam to suspect cervical cord compression or radiculopathy. Toradol given in the ED and will discharge.    ____________________________________________  FINAL CLINICAL IMPRESSION(S) / ED DIAGNOSES  Final diagnoses:  Acute pain of right shoulder    MEDICATIONS GIVEN DURING THIS VISIT:  Medications  ketorolac (TORADOL) 30 MG/ML injection 30 mg (has no administration in time range)    Note:  This document was prepared using Dragon voice recognition software and may include unintentional dictation errors.  Nanda Quinton, MD, [facility] Emergency Medicine    Zillah Alexie, Wonda [facility], MD 09/08/19 [phone]

## 2019-09-08 NOTE — ED Triage Notes (Signed)
Patient complains of right shoulder pain. Patient states pain has gotten worse since yesterday and states he can't lift his arm.   Patient seen yesterday for the same.

## 2019-09-08 NOTE — Discharge Instructions (Signed)

## 2019-09-08 NOTE — ED Provider Notes (Signed)
[facility] EMERGENCY DEPARTMENT Provider Note   CSN: [id number] Arrival date & time: 09/07/19  1612     History   Chief Complaint Chief Complaint  Patient presents with   Shoulder Injury    HPI Raymond Barrera. is a 58 y.o. male.     The history is provided by the patient. No language interpreter was used.  Shoulder Injury This is a new problem. The current episode started 3 to 5 hours ago. The problem occurs constantly. The problem has been gradually worsening. Nothing aggravates the symptoms. Nothing relieves the symptoms. The treatment provided no relief.  Pt reports he has shoulder problems.  Pt saw his Orthopaedist and is scheduled for a mri.  Pt states today he was trying to mow and lawnmower tip over causing increased pain.  Pt thinks shoulder was pulled more than impact   Past Medical History:  Diagnosis Date   Anxiety    due to the stroke   Arthritis    Back pain    hx of buldging disc   Diabetes mellitus without complication (HCC)    takes Metformin daily   High cholesterol    takes Zocor daily   Hypertension    takes Benazepril and HCTZ  daily   Joint pain    Joint swelling    Memory impairment    occassional - from stroke   Myocardial infarction ([place]) 1987   Pneumonia    hx of-80's   PONV (postoperative nausea and vomiting)    took a while for him to wake up after previous anesthesia   Shortness of breath dyspnea    do to pain   Sleep apnea    never had a sleep study,but states Dr. Cindie Laroche says he has it   Slurred speech    Stroke ([place]) 08/2013   7 mini-strokes, last stroke 2017   TIA (transient ischemic attack) 2014   x 7    Urinary frequency     Patient Active Problem List   Diagnosis Date Noted   Syncope [id number]   Chronic systolic heart failure ([place])    Pain of both hip joints    History of renal cell carcinoma    Renal mass 03/30/2019   Abdominal hematoma 08/13/2018   Radiculopathy 06/16/2018   Acute  CVA (cerebrovascular accident) ([place]) 02/01/2017   Diabetes mellitus ([place]) 02/01/2017   HTN (hypertension) 02/01/2017   Primary osteoarthritis of left hip 01/08/2016   DJD (degenerative joint disease) 03/20/2015   Primary osteoarthritis of right hip 02/26/2015   Lumbar spondylosis 10/17/2014   Lumbago 07/25/2014   Abnormality of gait 07/25/2014   Difficulty in walking(719.7) 07/25/2014   TIA (transient ischemic attack) 03/09/2013   Cocaine abuse ([place]) 03/09/2013   Accelerated hypertension 03/09/2013   Hyperlipidemia 03/09/2013   Current smoker 03/09/2013    Past Surgical History:  Procedure Laterality Date   ABDOMINAL EXPOSURE N/A 06/16/2018   Procedure: ABDOMINAL EXPOSURE;  Surgeon: Rosetta Posner, MD;  Location: [facility];  Service: Vascular;  Laterality: N/A;   ANKLE SURGERY  2008   left ankle-otif-[facility]   ANTERIOR LUMBAR FUSION Bilateral 06/16/2018   Procedure: LUMBAR 4-5 LUMBAR 5-SACRUM 1 ANTERIOR LUMBAR INTERBODY FUSION WITH INSTRUMENTATION AND ALLOGRAFT;  Surgeon: Phylliss Bob, MD;  Location: [facility];  Service: Orthopedics;  Laterality: Bilateral;   BACK SURGERY     HEMATOMA EVACUATION Left 08/13/2018   Procedure: EVACUATION HEMATOMA LEFT ABDOMINAL WALL;  Surgeon: Rosetta Posner, MD;  Location: [facility];  Service: Vascular;  Laterality: Left;   IR RADIOLOGIST EVAL & MGMT  07/27/2018   IR US GUIDE BX ASP/DRAIN  07/14/2018   JOINT REPLACEMENT     both hips replaced    LUMBAR LAMINECTOMY/DECOMPRESSION MICRODISCECTOMY Right 10/17/2014   Procedure: LUMBAR LAMINECTOMY/DECOMPRESSION MICRODISCECTOMY 2 LEVELS;  Surgeon: [name], MD;  Location: [facility] [facility];  Service: Neurosurgery;  Laterality: Right;  Right L45 L5S1 laminectomy and foraminotomy   MASS EXCISION  09/13/2012   Procedure: EXCISION MASS;  Surgeon: Jamesetta So, MD;  Location: [facility];  Service: General;  Laterality: N/A;   ROBOTIC ASSITED PARTIAL NEPHRECTOMY Left 03/30/2019   Procedure: XI ROBOTIC  ASSITED PARTIAL NEPHRECTOMY POSSIBLE RADICAL NEPHRECTOMY;  Surgeon: Ceasar Mons, MD;  [facility];  Service: Urology;  Laterality: Left;   TONSILLECTOMY     TOTAL HIP ARTHROPLASTY Right 03/20/2015   TOTAL HIP ARTHROPLASTY Right 03/20/2015   Procedure: TOTAL HIP ARTHROPLASTY ANTERIOR APPROACH;  Surgeon: Renette Butters, MD;  Location: [facility];  Service: Orthopedics;  Laterality: Right;   TOTAL HIP ARTHROPLASTY Left 01/08/2016   Procedure: TOTAL HIP ARTHROPLASTY ANTERIOR APPROACH;  Surgeon: Renette Butters, MD;  Location: [facility];  Service: Orthopedics;  Laterality: Left;        Home Medications    Prior to Admission medications   Medication Sig Start Date End Date Taking? Authorizing Provider  acetaminophen (TYLENOL) 500 MG tablet Take 1,000 mg by mouth every 6 (six) hours as needed (for pain).    [provider]  docusate sodium (COLACE) 100 MG capsule Take 1 capsule (100 mg total) by mouth 2 (two) times daily. 03/30/19   Debbrah Alar, PA-C  gabapentin (NEURONTIN) 300 MG capsule Take 300 mg by mouth 3 (three) times daily.    [provider]  glucose 4 GM chewable tablet Chew 1 tablet by mouth as needed for low blood sugar.    [provider]  labetalol (NORMODYNE) 100 MG tablet Take 1 tablet (100 mg total) by mouth 2 (two) times daily. 05/13/19   Croitoru, Mihai, MD  lidocaine (LIDODERM) 5 % Place 1 patch onto the skin daily. Remove & Discard patch within 12 hours or as directed by MD 07/10/19   Langston Masker B, PA-C  metFORMIN (GLUCOPHAGE) 500 MG tablet Take 500 mg by mouth 2 (two) times daily with a meal.    [provider]  ondansetron (ZOFRAN) 4 MG tablet Take 1 tablet (4 mg total) by mouth every 8 (eight) hours as needed for nausea or vomiting. 03/30/19   Debbrah Alar, PA-C  Oxycodone HCl 10 MG TABS Take 1 tablet (10 mg total) by mouth 3 (three) times daily as needed (pain.). Patient taking differently: Take 10 mg by mouth 3 (three)  times daily as needed (for pain).  03/30/19   Debbrah Alar, PA-C  rosuvastatin (CRESTOR) 20 MG tablet Take 1 tablet (20 mg total) by mouth at bedtime. 04/20/19   Mullis, Kiersten P, DO  tiZANidine (ZANAFLEX) 4 MG tablet Take 2-4 mg by mouth 2 (two) times daily as needed for muscle spasms.  02/22/19   [provider]    Family History Family History  Problem Relation Age of Onset   Heart disease Father     Social History Social History   Tobacco Use   Smoking status: Current Some Day Smoker    Packs/day: 0.25    Years: 47.00    Pack years: 11.75    Types: Cigarettes   Smokeless tobacco: Never Used   Tobacco  comment: 2 cigarettes  Substance Use Topics   Alcohol use: No    Comment: quit 2012   Drug use: Not Currently    Types: Cocaine    Comment: many yrs ago., last time- late 2016     Allergies   Poison ivy extract [poison ivy extract]   Review of Systems Review of Systems  Musculoskeletal: Positive for arthralgias.  All other systems reviewed and are negative.    Physical Exam Updated Vital Signs BP (!) 164/98 (BP Location: Left Arm)    Pulse 84    Temp 97.7 F (36.5 C) (Oral)    Resp 16    Ht 6\' 6"  (1.981 m)    Wt 113.4 kg    SpO2 100%    BMI 28.89 kg/m   Physical Exam Vitals signs and nursing note reviewed.  Constitutional:      Appearance: He is well-developed.  HENT:     Head: Normocephalic and atraumatic.  Eyes:     Conjunctiva/sclera: Conjunctivae normal.  Neck:     Musculoskeletal: Neck supple.  Cardiovascular:     Rate and Rhythm: Normal rate and regular rhythm.     Heart sounds: No murmur.  Pulmonary:     Effort: Pulmonary effort is normal. No respiratory distress.  Abdominal:     Tenderness: There is no abdominal tenderness.  Musculoskeletal:        General: Tenderness present. No swelling.     Comments: Pain with range of motion  nv and ns intact  Skin:    General: Skin is warm and dry.  Neurological:     Mental Status: He  is alert.      ED Treatments / Results  Labs (all labs ordered are listed, but only abnormal results are displayed) Labs Reviewed - No data to display  EKG None  Radiology Dg Shoulder Right  Result Date: 09/07/2019 CLINICAL DATA:  Shoulder pain after lawnmower fell on top of them. EXAM: RIGHT SHOULDER - 2+ VIEW COMPARISON:  09/06/2019 FINDINGS: Two views study shows no fracture. No evidence for shoulder separation or dislocation. Degenerative changes noted at the acromioclavicular joint. IMPRESSION: Negative. Electronically Signed   By: Misty Stanley M.D.   On: 09/07/2019 17:33   Dg Shoulder Right  Result Date: 09/07/2019 Clinical:  Right shoulder pain X-rays were done of the right shoulder, two views. There is some degenerative changes at the right [facility] joint.  The humeral head is well located within the glenoid.  No fractures or calcific bodies are noted.  Bone quality is good. Impression:  Mild AC joint degenerative changes of the right shoulder, otherwise negative. Electronically Signed Sanjuana Kava, MD 10/7/20209:47 AM    Procedures Procedures (including critical care time)  Medications Ordered in ED Medications - No data to display   Initial Impression / Assessment and Plan / ED Course  I have reviewed the triage vital signs and the nursing notes.  Pertinent labs & imaging results that were available during my care of the patient were reviewed by me and considered in my medical decision making (see chart for details).        MDM  Xray reviewed and discussed with pt.  Pt declined sling as he has one.  Pt reports he has pain medication at home  Final Clinical Impressions(s) / ED Diagnoses   Final diagnoses:  Contusion of right shoulder, initial encounter    ED Discharge Orders    None    An After Visit Summary was printed and  given to the patient.    Fransico Meadow, PA-C 09/08/19 Jeral Pinch, MD 09/16/19 [phone]

## 2019-09-09 ENCOUNTER — Telehealth: Payer: Self-pay | Admitting: Radiology

## 2019-09-09 NOTE — Telephone Encounter (Signed)
MRI denied by Medicaid Date of return office visit was not 6 weeks from the time of the start of conservative care, was 5 weeks.   I will have to get him in for another visit and re do the authorization once he has another doctors visit.  I will call him

## 2019-09-10 ENCOUNTER — Other Ambulatory Visit: Payer: Medicaid Other

## 2019-09-12 NOTE — Telephone Encounter (Signed)
Unable to reach  He needs to be evaluated again, he has appointment tomorrow  FYI, after he is seen back we will have to try again for MRI  Date of re evaluation was not 6 weeks from start of conservative treatment

## 2019-09-13 ENCOUNTER — Ambulatory Visit: Payer: Medicaid Other | Admitting: Orthopaedic Surgery

## 2019-09-19 ENCOUNTER — Telehealth: Payer: Self-pay

## 2019-09-19 NOTE — Telephone Encounter (Signed)
He needs appointment with Dr Luna Glasgow since it has been 6 weeks now, need documentation he has had no improvement then can try again for MRI

## 2019-09-19 NOTE — Telephone Encounter (Signed)
Patient's wife [name] left message after closing on Friday wanting to know about Medicaid denying the MRI and what should he do next.  Please call and advise

## 2019-09-22 ENCOUNTER — Ambulatory Visit: Payer: Medicaid Other | Admitting: Orthopaedic Surgery

## 2019-09-22 ENCOUNTER — Encounter: Payer: Self-pay | Admitting: Orthopaedic Surgery

## 2019-09-22 ENCOUNTER — Other Ambulatory Visit: Payer: Self-pay

## 2019-09-22 DIAGNOSIS — G8929 Other chronic pain: Secondary | ICD-10-CM

## 2019-09-22 DIAGNOSIS — M25511 Pain in right shoulder: Secondary | ICD-10-CM

## 2019-09-22 NOTE — Progress Notes (Signed)
PROCEDURE NOTE:  The patient request injection, verbal consent was obtained.  The right shoulder was prepped appropriately after time out was performed.   Sterile technique was observed and injection of 1 cc of Depo-Medrol 40 mg with several cc's of plain xylocaine. Anesthesia was provided by ethyl chloride and a 20-gauge needle was used to inject the shoulder area. A posterior approach was used.  The injection was tolerated well.  A band aid dressing was applied.  The patient was advised to apply ice later today and tomorrow to the injection sight as needed.  He is scheduled to have MRI of the right shoulder.  I will see after the MRI.  He continues to have pain. I am concerned about rotator cuff injury.  He had new injury recently to the shoulder and was seen in the ER.  I have reviewed the notes.  Electronically Signed Sanjuana Kava, MD 10/22/20209:42 AM

## 2019-09-29 ENCOUNTER — Telehealth: Payer: Self-pay

## 2019-09-29 NOTE — Telephone Encounter (Signed)
Patient aware of status of MRI (denial) and of the peer to peer review scheduled today, 09/29/19, at 11am, with Dr Luna Glasgow as noted.

## 2019-09-29 NOTE — Telephone Encounter (Signed)
-----   Message from Sanjuana Kava, MD sent at 09/28/2019  8:58 AM EDT ----- Regarding: RE: MRI Denied He keeps getting denied.  What more do I need to do? ----- Message ----- From: Elizabeth Sauer, LPN Sent: [id number]   4:24 PM EDT To: Sanjuana Kava, MD Subject: MRI Denied                                     Dr. Luna Glasgow,    Pt MRI was denied due to:  Based on [employer] Musculoskeletal Imaging Guidelines Section(s): MS 19 Shoulder, we cannot approve this request. Your records show that you have shoulder pain. The reason this request cannot be approved is because: 1. Guidelines support imaging for new or changed musculoskeletal signs and/or symptoms when there is a failure of significant clinical improvement following a recent (within three months) six week trial of provider-directed conservative treatment. This treatment might include RICE, NSAIDS, narcotic and non-narcotic analgesic medication, oral or injectable corticosteroids, viscosupplementation injections, a physician directed home exercise program, cross-training, and/or physical/occupational therapy, or immobilization by splinting/casting/bracing. The clinical information provided does not meet this criterion and, therefore, the request is not indicated at this time.

## 2019-09-29 NOTE — Telephone Encounter (Signed)
Called [employer] to figure out next steps. Peer to peer scheduled today at 81 with Dr. Luna Glasgow

## 2019-09-29 NOTE — Telephone Encounter (Signed)
Pt has been seen for this visit as scheduled.

## 2019-10-03 ENCOUNTER — Other Ambulatory Visit: Payer: Self-pay

## 2019-10-03 ENCOUNTER — Ambulatory Visit
Admission: RE | Admit: 2019-10-03 | Discharge: 2019-10-03 | Disposition: A | Discharge status: Home / Self Care | Payer: Medicaid Other | Source: Ambulatory Visit | Attending: Orthopaedic Surgery | Admitting: Orthopaedic Surgery

## 2019-10-03 DIAGNOSIS — G8929 Other chronic pain: Secondary | ICD-10-CM

## 2019-10-11 ENCOUNTER — Ambulatory Visit (INDEPENDENT_AMBULATORY_CARE_PROVIDER_SITE_OTHER): Payer: Medicaid Other | Admitting: Orthopaedic Surgery

## 2019-10-11 ENCOUNTER — Other Ambulatory Visit: Payer: Self-pay

## 2019-10-11 ENCOUNTER — Encounter: Payer: Self-pay | Admitting: Orthopaedic Surgery

## 2019-10-11 VITALS — BP 137/98 | HR 79 | Temp 97.0°F | Ht 78.0 in | Wt 260.0 lb

## 2019-10-11 DIAGNOSIS — M25511 Pain in right shoulder: Secondary | ICD-10-CM

## 2019-10-11 DIAGNOSIS — Z905 Acquired absence of kidney: Secondary | ICD-10-CM | POA: Diagnosis not present

## 2019-10-11 DIAGNOSIS — F17210 Nicotine dependence, cigarettes, uncomplicated: Secondary | ICD-10-CM

## 2019-10-11 DIAGNOSIS — G8929 Other chronic pain: Secondary | ICD-10-CM | POA: Diagnosis not present

## 2019-10-11 NOTE — Progress Notes (Signed)
Patient TD:[id number] Raymond Barrera., male DOB:[birth year], 58 y.o. [id number]  Chief Complaint  Patient presents with  . Shoulder Pain    Chronic right shoulder pain.    HPI  Raymond Barrera. is a 58 y.o. male who has continued pain of the right shoulder.  He had his MRI which showed: IMPRESSION: 1. Full-thickness, full width tear of the infraspinatus tendon with 2 cm retraction. Mild muscle edema without atrophy. 2. High-grade partial-thickness bursal surface tear of the mid to distal supraspinatus tendon. 3. Mild subscapularis tendinosis with tiny intrasubstance tear. 4. Mild glenohumeral and acromioclavicular degenerative changes.  I have explained the findings to him.  I will have Dr. Aline Brochure see him and see if he is a candidate for surgery. The patient agrees.   Body mass index is 30.05 kg/m.  ROS  Review of Systems  Constitutional: Positive for activity change.  Musculoskeletal: Positive for arthralgias and myalgias.  Psychiatric/Behavioral: Agitation: .dx. The patient is nervous/anxious.   All other systems reviewed and are negative.   All other systems reviewed and are negative.  The following is a summary of the past history medically, past history surgically, known current medicines, social history and family history.  This information is gathered electronically by the computer from prior information and documentation.  I review this each visit and have found including this information at this point in the chart is beneficial and informative.    Past Medical History:  Diagnosis Date  . Anxiety    due to the stroke  . Arthritis   . Back pain    hx of buldging disc  . Diabetes mellitus without complication ([place])    takes Metformin daily  . High cholesterol    takes Zocor daily  . Hypertension    takes Benazepril and HCTZ  daily  . Joint pain   . Joint swelling   . Memory impairment    occassional - from stroke  . Myocardial infarction ([place]) 1987  .  Pneumonia    hx of-80's  . PONV (postoperative nausea and vomiting)    took a while for him to wake up after previous anesthesia  . Shortness of breath dyspnea    do to pain  . Sleep apnea    never had a sleep study,but states Dr. Cindie Laroche says he has it  . Slurred speech   . Stroke ([place]) 08/2013   7 mini-strokes, last stroke 2017  . TIA (transient ischemic attack) 2014   x 7   . Urinary frequency     Past Surgical History:  Procedure Laterality Date  . ABDOMINAL EXPOSURE N/A 06/16/2018   Procedure: ABDOMINAL EXPOSURE;  Surgeon: Rosetta Posner, MD;  Location: [facility];  Service: Vascular;  Laterality: N/A;  . ANKLE SURGERY  2008   left ankle-otif-[facility]  . ANTERIOR LUMBAR FUSION Bilateral 06/16/2018   Procedure: LUMBAR 4-5 LUMBAR 5-SACRUM 1 ANTERIOR LUMBAR INTERBODY FUSION WITH INSTRUMENTATION AND ALLOGRAFT;  Surgeon: Phylliss Bob, MD;  Location: [facility];  Service: Orthopedics;  Laterality: Bilateral;  . BACK SURGERY    . HEMATOMA EVACUATION Left 08/13/2018   Procedure: EVACUATION HEMATOMA LEFT ABDOMINAL WALL;  Surgeon: Rosetta Posner, MD;  [facility];  Service: Vascular;  Laterality: Left;  . IR RADIOLOGIST EVAL & MGMT  07/27/2018  . IR US GUIDE BX ASP/DRAIN  07/14/2018  . JOINT REPLACEMENT     both hips replaced   . LUMBAR LAMINECTOMY/DECOMPRESSION MICRODISCECTOMY Right 10/17/2014   Procedure: LUMBAR LAMINECTOMY/DECOMPRESSION MICRODISCECTOMY 2 LEVELS;  Surgeon: [name], MD;  Location: [facility];  Service: Neurosurgery;  Laterality: Right;  Right L45 L5S1 laminectomy and foraminotomy  . MASS EXCISION  09/13/2012   Procedure: EXCISION MASS;  Surgeon: Jamesetta So, MD;  [facility];  Service: General;  Laterality: N/A;  . ROBOTIC ASSITED PARTIAL NEPHRECTOMY Left 03/30/2019   Procedure: XI ROBOTIC ASSITED PARTIAL NEPHRECTOMY POSSIBLE RADICAL NEPHRECTOMY;  Surgeon: Ceasar Mons, MD;  [facility];  Service: Urology;  Laterality: Left;  . TONSILLECTOMY     . TOTAL HIP ARTHROPLASTY Right 03/20/2015  . TOTAL HIP ARTHROPLASTY Right 03/20/2015   Procedure: TOTAL HIP ARTHROPLASTY ANTERIOR APPROACH;  Surgeon: Renette Butters, MD;  Location: [facility];  Service: Orthopedics;  Laterality: Right;  . TOTAL HIP ARTHROPLASTY Left 01/08/2016   Procedure: TOTAL HIP ARTHROPLASTY ANTERIOR APPROACH;  Surgeon: Renette Butters, MD;  Location: [facility];  Service: Orthopedics;  Laterality: Left;    Family History  Problem Relation Age of Onset  . Heart disease Father     Social History Social History   Tobacco Use  . Smoking status: Current Some Day Smoker    Packs/day: 0.25    Years: 47.00    Pack years: 11.75    Types: Cigarettes  . Smokeless tobacco: Never Used  . Tobacco comment: 2 cigarettes  Substance Use Topics  . Alcohol use: No    Comment: quit 2012  . Drug use: Not Currently    Types: Cocaine    Comment: many yrs ago., last time- late 2016    Allergies  Allergen Reactions  . Poison Ivy Extract [Poison Ivy Extract] Itching and Rash    Current Outpatient Medications  Medication Sig Dispense Refill  . amLODipine (NORVASC) 10 MG tablet TK 1 T PO QD    . gabapentin (NEURONTIN) 300 MG capsule Take 300 mg by mouth 3 (three) times daily.    Marland Kitchen glucose 4 GM chewable tablet Chew 1 tablet by mouth as needed for low blood sugar.    . labetalol (NORMODYNE) 200 MG tablet TK 1 T PO BID    . lidocaine (LIDODERM) 5 % Place 1 patch onto the skin daily. Remove & Discard patch within 12 hours or as directed by MD 30 patch 0  . metFORMIN (GLUCOPHAGE) 500 MG tablet Take 500 mg by mouth 2 (two) times daily with a meal.    . ondansetron (ZOFRAN) 4 MG tablet Take 1 tablet (4 mg total) by mouth every 8 (eight) hours as needed for nausea or vomiting. 20 tablet 0  . Oxycodone HCl 10 MG TABS Take 1 tablet (10 mg total) by mouth 3 (three) times daily as needed (pain.). (Patient taking differently: Take 10 mg by mouth 3 (three) times daily as needed (for pain). ) 10  tablet 0  . rosuvastatin (CRESTOR) 20 MG tablet Take 1 tablet (20 mg total) by mouth at bedtime. 30 tablet 0  . simvastatin (ZOCOR) 40 MG tablet TK 1 T PO QD    . tiZANidine (ZANAFLEX) 4 MG tablet Take 2-4 mg by mouth 2 (two) times daily as needed for muscle spasms.      No current facility-administered medications for this visit.      Physical Exam  Blood pressure (!) 137/98, pulse 79, temperature (!) 97 F (36.1 C), height 6\' 6"  (1.981 m), weight 260 lb (117.9 kg).  Constitutional: overall normal hygiene, normal nutrition, well developed, normal grooming, normal body habitus. Assistive device:none  Musculoskeletal: gait and station Limp none,  muscle tone and strength are normal, no tremors or atrophy is present.  .  Neurological: coordination overall normal.  Deep tendon reflex/nerve stretch intact.  Sensation normal.  Cranial nerves II-XII intact.   Skin:   Normal overall no scars, lesions, ulcers or rashes. No psoriasis.  Psychiatric: Alert and oriented x 3.  Recent memory intact, remote memory unclear.  Normal mood and affect. Well groomed.  Good eye contact.  Cardiovascular: overall no swelling, no varicosities, no edema bilaterally, normal temperatures of the legs and arms, no clubbing, cyanosis and good capillary refill.  Lymphatic: palpation is normal.  Right shoulder with painful ROM.  NV intact. No effusion,no redness.  All other systems reviewed and are negative   The patient has been educated about the nature of the problem(s) and counseled on treatment options.  The patient appeared to understand what I have discussed and is in agreement with it.  Encounter Diagnoses  Name Primary?  . Chronic right shoulder pain Yes  . History of kidney removal   . Nicotine dependence, cigarettes, uncomplicated     PLAN Call if any problems.  Precautions discussed.  Continue current medications.   Return to clinic to see Dr. Aline Brochure for possible surgery.   Electronically  Signed Sanjuana Kava, MD 11/10/20209:36 AM

## 2019-10-12 ENCOUNTER — Telehealth: Payer: Self-pay | Admitting: Orthopaedic Surgery

## 2019-10-12 NOTE — Telephone Encounter (Signed)
Called patient re: consult appointment scheduling with Dr Aline Brochure per Dr Luna Glasgow; left message.

## 2019-10-13 ENCOUNTER — Telehealth: Payer: Self-pay | Admitting: Orthopaedic Surgery

## 2019-10-13 NOTE — Telephone Encounter (Signed)
He is getting oxycodone from a doctor in [place]. Contact him.

## 2019-10-13 NOTE — Telephone Encounter (Signed)
Patient returned call this morning; scheduled.

## 2019-10-13 NOTE — Telephone Encounter (Signed)
Patient is requesting something for pain.  He forgot to ask you when he was here in the office.    He does have an appointment for consult with Dr. Aline Brochure on the 24th.  He uses [facility] on [address].

## 2019-10-14 NOTE — Telephone Encounter (Signed)
I spoke back with the patient and told him what you said.  He said that the pain medication was given about a month ago and that Dr Erma Pinto would not give him anything else.  Raymond Barrera asks that you reconsider your decision for something for pain for him.

## 2019-10-17 MED ORDER — HYDROCODONE-ACETAMINOPHEN 5-325 MG PO TABS: ORAL_TABLET | ORAL | 0 refills | Status: DC

## 2019-10-18 ENCOUNTER — Ambulatory Visit: Payer: Medicaid Other | Admitting: Orthopaedic Surgery

## 2019-10-19 ENCOUNTER — Telehealth: Payer: Self-pay | Admitting: Orthopaedic Surgery

## 2019-10-19 NOTE — Telephone Encounter (Signed)
Patient called back relaying has not heard anything further regarding prescription.  Relayed that per chart notes, Dr Luna Glasgow approved; [facility] on [address] received the request 10/17/19 late evening. Patient aware to contact pharmacy.

## 2019-10-25 ENCOUNTER — Encounter: Payer: Self-pay | Admitting: Orthopedic Surgery

## 2019-10-25 ENCOUNTER — Other Ambulatory Visit: Payer: Self-pay

## 2019-10-25 ENCOUNTER — Ambulatory Visit (INDEPENDENT_AMBULATORY_CARE_PROVIDER_SITE_OTHER): Payer: Medicaid Other | Admitting: Orthopedic Surgery

## 2019-10-25 VITALS — BP 126/83 | HR 83 | Ht 78.0 in | Wt 260.0 lb

## 2019-10-25 DIAGNOSIS — S46011A Strain of muscle(s) and tendon(s) of the rotator cuff of right shoulder, initial encounter: Secondary | ICD-10-CM

## 2019-10-25 DIAGNOSIS — G8929 Other chronic pain: Secondary | ICD-10-CM

## 2019-10-25 DIAGNOSIS — M25511 Pain in right shoulder: Secondary | ICD-10-CM | POA: Diagnosis not present

## 2019-10-25 NOTE — Progress Notes (Signed)
Raymond Barrera.  10/25/2019  Body mass index is 30.05 kg/m.   HISTORY SECTION :  Chief Complaint  Patient presents with  . Shoulder Pain    left shoulder pain surgical consult   . Medication Refill    wife has asked if patient can have stronger pain meds/ taking Hydrocodone    HPI The patient presents for evaluation of  (mild/moderate/severe/ ) severe pain, in the (right /left) right shoulder, for 3-1/2 weeks, associated with painful range of motion loss of motion severe night pain.  Prior treatment none  The patient had some mild symptoms in the right shoulder reporting a dislocation as a kid and then some mild intermittent discomfort throughout his life but on November 7 he fell backward acute pain eventually had an MRI which showed a full-thickness full width infraspinatus tendon tear 2 cm retraction partial thickness bursal tear of the distal supraspinatus tendon and although the MRI was read as no atrophy has atrophy grade 2 of the supraspinatus   ROS  Chronic pain lower back hip status post bilateral total hips and 3 back surgeries last done by Dr. Lynann Bologna with ongoing pain issues  Previously seen by a pain management specialist who is listed as Dr. Primus Bravo.  I talked to their office today they tried to say that they had discharged the patient but it has not been 30 days and they are still required to see this patient until he can get referred to another doctor especially with his history of opioid tolerance    has a past medical history of Anxiety, Arthritis, Back pain, Diabetes mellitus without complication ([place]), High cholesterol, Hypertension, Joint pain, Joint swelling, Memory impairment, Myocardial infarction ([place]) (1987), Pneumonia, PONV (postoperative nausea and vomiting), Shortness of breath dyspnea, Sleep apnea, Slurred speech, Stroke ([place]) (08/2013), TIA (transient ischemic attack) (2014), and Urinary frequency.   Past Surgical History:  Procedure Laterality Date   . ABDOMINAL EXPOSURE N/A 06/16/2018   Procedure: ABDOMINAL EXPOSURE;  Surgeon: Rosetta Posner, MD;  Location: [facility];  Service: Vascular;  Laterality: N/A;  . ANKLE SURGERY  2008   left ankle-otif-[facility]  . ANTERIOR LUMBAR FUSION Bilateral 06/16/2018   Procedure: LUMBAR 4-5 LUMBAR 5-SACRUM 1 ANTERIOR LUMBAR INTERBODY FUSION WITH INSTRUMENTATION AND ALLOGRAFT;  Surgeon: Phylliss Bob, MD;  Location: [facility];  Service: Orthopedics;  Laterality: Bilateral;  . BACK SURGERY    . HEMATOMA EVACUATION Left 08/13/2018   Procedure: EVACUATION HEMATOMA LEFT ABDOMINAL WALL;  Surgeon: Rosetta Posner, MD;  [facility];  Service: Vascular;  Laterality: Left;  . IR RADIOLOGIST EVAL & MGMT  07/27/2018  . IR US GUIDE BX ASP/DRAIN  07/14/2018  . JOINT REPLACEMENT     both hips replaced   . LUMBAR LAMINECTOMY/DECOMPRESSION MICRODISCECTOMY Right 10/17/2014   Procedure: LUMBAR LAMINECTOMY/DECOMPRESSION MICRODISCECTOMY 2 LEVELS;  Surgeon: [name], MD;  Location: [facility];  Service: Neurosurgery;  Laterality: Right;  Right L45 L5S1 laminectomy and foraminotomy  . MASS EXCISION  09/13/2012   Procedure: EXCISION MASS;  Surgeon: Jamesetta So, MD;  [facility];  Service: General;  Laterality: N/A;  . ROBOTIC ASSITED PARTIAL NEPHRECTOMY Left 03/30/2019   Procedure: XI ROBOTIC ASSITED PARTIAL NEPHRECTOMY POSSIBLE RADICAL NEPHRECTOMY;  Surgeon: Ceasar Mons, MD;  [facility];  Service: Urology;  Laterality: Left;  . TONSILLECTOMY    . TOTAL HIP ARTHROPLASTY Right 03/20/2015  . TOTAL HIP ARTHROPLASTY Right 03/20/2015   Procedure: TOTAL HIP ARTHROPLASTY ANTERIOR APPROACH;  Surgeon: Ernesta Amble  Percell Miller, MD;  Location: [facility];  Service: Orthopedics;  Laterality: Right;  . TOTAL HIP ARTHROPLASTY Left 01/08/2016   Procedure: TOTAL HIP ARTHROPLASTY ANTERIOR APPROACH;  Surgeon: Renette Butters, MD;  Location: [facility];  Service: Orthopedics;  Laterality: Left;    Body mass index is 30.05  kg/m.   Allergies  Allergen Reactions  . Poison Ivy Extract [Poison Ivy Extract] Itching and Rash     Current Outpatient Medications:  .  amLODipine (NORVASC) 10 MG tablet, TK 1 T PO QD, Disp: , Rfl:  .  gabapentin (NEURONTIN) 300 MG capsule, Take 300 mg by mouth 3 (three) times daily., Disp: , Rfl:  .  glucose 4 GM chewable tablet, Chew 1 tablet by mouth as needed for low blood sugar., Disp: , Rfl:  .  HYDROcodone-acetaminophen (NORCO/VICODIN) 5-325 MG tablet, One tablet every six hours for pain.  Limit 7 days., Disp: 28 tablet, Rfl: 0 .  labetalol (NORMODYNE) 200 MG tablet, TK 1 T PO BID, Disp: , Rfl:  .  lidocaine (LIDODERM) 5 %, Place 1 patch onto the skin daily. Remove & Discard patch within 12 hours or as directed by MD, Disp: 30 patch, Rfl: 0 .  metFORMIN (GLUCOPHAGE) 500 MG tablet, Take 500 mg by mouth 2 (two) times daily with a meal., Disp: , Rfl:  .  ondansetron (ZOFRAN) 4 MG tablet, Take 1 tablet (4 mg total) by mouth every 8 (eight) hours as needed for nausea or vomiting., Disp: 20 tablet, Rfl: 0 .  rosuvastatin (CRESTOR) 20 MG tablet, Take 1 tablet (20 mg total) by mouth at bedtime., Disp: 30 tablet, Rfl: 0 .  simvastatin (ZOCOR) 40 MG tablet, TK 1 T PO QD, Disp: , Rfl:  .  tiZANidine (ZANAFLEX) 4 MG tablet, Take 2-4 mg by mouth 2 (two) times daily as needed for muscle spasms. , Disp: , Rfl:  .  Oxycodone HCl 10 MG TABS, Take 1 tablet (10 mg total) by mouth 3 (three) times daily as needed (pain.). (Patient not taking: Reported on 10/25/2019), Disp: 10 tablet, Rfl: 0   PHYSICAL EXAM SECTION: 1) BP 126/83   Pulse 83   Ht 6\' 6"  (1.981 m)   Wt 260 lb (117.9 kg)   BMI 30.05 kg/m   Body mass index is 30.05 kg/m. General appearance: Well-developed well-nourished no gross deformities  2) Cardiovascular normal pulse and perfusion in the upper extremities normal color without edema  3) Neurologically deep tendon reflexes are equal and normal, no sensation loss or deficits no  pathologic reflexes  4) Psychological: Awake alert and oriented x3 mood and affect normal  5) Skin no lacerations or ulcerations no nodularity no palpable masses, no erythema or nodularity  6) Musculoskeletal:   Left arm stable no tenderness pain or swelling no motion loss  Right arm external rotation with his arm at his side is only 30 degrees his active abduction is 45 degrees and active flexion is 40 degrees his passive range of motion is 110 degrees he has severe weakness in the cuff in all positions   MEDICAL DECISION SECTION:  Encounter Diagnoses  Name Primary?  . Chronic right shoulder pain Yes  . Traumatic complete tear of right rotator cuff, initial encounter     Imaging report  FINDINGS: Rotator cuff: High-grade partial-thickness bursal surface tear of the mid to distal supraspinatus tendon. Full-thickness, full width tear of the infraspinatus tendon with 2 cm retraction to the top of the humeral head. Mild subscapularis tendinosis with tiny intrasubstance  tear. The teres minor tendon is unremarkable.   Muscles: Mild infraspinatus and teres minor muscle edema. Mild teres minor muscle atrophy.   Biceps long head:  Intact and normally positioned.   Acromioclavicular Joint: Mild arthropathy of the acromioclavicular joint. Type III acromion. Small amount of fluid in the subacromial/subdeltoid bursa.   Glenohumeral Joint: No joint effusion. Mild partial-thickness cartilage loss.   Labrum: Grossly intact, but evaluation is limited by lack of intraarticular fluid.   Bones:  No marrow abnormality, fracture or dislocation.   Other: None.   IMPRESSION: 1. Full-thickness, full width tear of the infraspinatus tendon with 2 cm retraction. Mild muscle edema without atrophy. 2. High-grade partial-thickness bursal surface tear of the mid to distal supraspinatus tendon. 3. Mild subscapularis tendinosis with tiny intrasubstance tear. 4. Mild glenohumeral and  acromioclavicular degenerative changes.     Electronically Signed   By: Titus Dubin M.D.   On: 10/03/2019 11:37  I did reviewed the images and there is a correction: There is a grade 2 amount of supraspinatus atrophy noted on image number, 18 series 6  Plan:  (Rx., Inj., surg., Frx, MRI/CT, XR:2)  Recommend referral to Dr. Marlou Sa for possible graft versus reverse replacement  I have also sent the patient back to Dr. Primus Bravo the last known prescriber of his oxycodone and he can manage it or not management at his choice  2:50 PM Arther Abbott, MD  10/25/2019

## 2019-10-25 NOTE — Patient Instructions (Signed)
Referral for Reverse shoulder

## 2019-11-04 ENCOUNTER — Ambulatory Visit: Payer: Medicaid Other | Admitting: Orthopedic Surgery

## 2019-11-17 ENCOUNTER — Other Ambulatory Visit: Payer: Self-pay

## 2019-11-17 ENCOUNTER — Ambulatory Visit (INDEPENDENT_AMBULATORY_CARE_PROVIDER_SITE_OTHER): Payer: Medicaid Other | Admitting: Orthopedic Surgery

## 2019-11-17 DIAGNOSIS — S46011A Strain of muscle(s) and tendon(s) of the rotator cuff of right shoulder, initial encounter: Secondary | ICD-10-CM | POA: Diagnosis not present

## 2019-11-17 DIAGNOSIS — D1721 Benign lipomatous neoplasm of skin and subcutaneous tissue of right arm: Secondary | ICD-10-CM

## 2019-11-18 ENCOUNTER — Encounter: Payer: Self-pay | Admitting: Orthopedic Surgery

## 2019-11-18 NOTE — Progress Notes (Signed)
Office Visit Note   Patient: Raymond Barrera.           Date of Birth: 04-29-1961           MRN: [id number] Visit Date: 11/17/2019 Requested by: Raymond Civil, MD [address] [place],  [place] [address] PCP: Raymond Gaskins, MD  Subjective: Chief Complaint  Patient presents with  . Right Shoulder - Pain    HPI: Raymond Barrera is a patient with right shoulder pain.  Date of injury approximately a month ago when he was trying to get a mower off the trailer.  He is right-hand dominant.  He has been in a sling.  MRI scan is reviewed which shows supraspinatus and infraspinatus tendon tears with retraction to the top of the humeral head.  Not too much in terms of fatty atrophy in those muscle bellies.  The scan does not really go down to the subaxillary region where the patient also reports a painful lipoma which has been growing rapidly since June.              ROS: All systems reviewed are negative as they relate to the chief complaint within the history of present illness.  Patient denies  fevers or chills.   Assessment & Plan: Visit Diagnoses:  1. Lipoma of right shoulder   2. Traumatic complete tear of right rotator cuff, initial encounter     Plan: Impression is right shoulder rotator cuff pathology which is possibly repairable in this 58 year old patient.  He also has rapidly growing lipoma which is painful in the subaxillary region on the right-hand side.  Measures about 10 x 8 cm.  Plan is for MRI with contrast right axillary region to evaluate the large painful lipoma.  If that shows any suggestion of liposarcoma we will need to refer him for further management at [facility].  Depending on the outcome of that scan we can prioritize when to attempt rotator cuff repair in this patient.  He is quite debilitated by both the lipoma and the rotator cuff tear.  Follow-Up Instructions: Return for after MRI.   Orders:  Orders Placed This Encounter   Procedures  . MR SHOULDER RIGHT W CONTRAST   No orders of the defined types were placed in this encounter.     Procedures: No procedures performed   Clinical Data: No additional findings.  Objective: Vital Signs: There were no vitals taken for this visit.  Physical Exam:   Constitutional: Patient appears well-developed HEENT:  Head: Normocephalic Eyes:EOM are normal Neck: Normal range of motion Cardiovascular: Normal rate Pulmonary/chest: Effort normal Neurologic: Patient is alert Skin: Skin is warm Psychiatric: Patient has normal mood and affect    Ortho Exam: Ortho exam demonstrates diminished external rotation and supraspinatus strength testing on the right compared to left.  Subscap strength is intact.  Passive range of motion is maintained.  Lipoma about a handbreadth inferior to the axillary region is present measuring about 8 x 10 cm.  It is tender to palpation.  No lymphadenopathy present around this mass.  Deltoid is functional.  Specialty Comments:  No specialty comments available.  Imaging: No results found.   PMFS History: Patient Active Problem List   Diagnosis Date Noted  . Syncope 04/18/2019  . Chronic systolic heart failure ([place])   . Pain of both hip joints   . History of renal cell carcinoma   . Renal mass 03/30/2019  . Abdominal hematoma 08/13/2018  . Radiculopathy  06/16/2018  . Acute CVA (cerebrovascular accident) ([place]) 02/01/2017  . Diabetes mellitus ([place]) 02/01/2017  . HTN (hypertension) 02/01/2017  . Primary osteoarthritis of left hip 01/08/2016  . DJD (degenerative joint disease) 03/20/2015  . Primary osteoarthritis of right hip 02/26/2015  . Lumbar spondylosis 10/17/2014  . Lumbago 07/25/2014  . Abnormality of gait 07/25/2014  . Difficulty in walking(719.7) 07/25/2014  . TIA (transient ischemic attack) 03/09/2013  . Cocaine abuse ([place]) 03/09/2013  . Accelerated hypertension 03/09/2013  . Hyperlipidemia 03/09/2013  . Current  smoker 03/09/2013   Past Medical History:  Diagnosis Date  . Anxiety    due to the stroke  . Arthritis   . Back pain    hx of buldging disc  . Diabetes mellitus without complication ([place])    takes Metformin daily  . High cholesterol    takes Zocor daily  . Hypertension    takes Benazepril and HCTZ  daily  . Joint pain   . Joint swelling   . Memory impairment    occassional - from stroke  . Myocardial infarction ([place]) 1987  . Pneumonia    hx of-80's  . PONV (postoperative nausea and vomiting)    took a while for him to wake up after previous anesthesia  . Shortness of breath dyspnea    do to pain  . Sleep apnea    never had a sleep study,but states Dr. Cindie Barrera says he has it  . Slurred speech   . Stroke ([place]) 08/2013   7 mini-strokes, last stroke 2017  . TIA (transient ischemic attack) 2014   x 7   . Urinary frequency     Family History  Problem Relation Age of Onset  . Heart disease Father     Past Surgical History:  Procedure Laterality Date  . ABDOMINAL EXPOSURE N/A 06/16/2018   Procedure: ABDOMINAL EXPOSURE;  Surgeon: Rosetta Posner, MD;  Location: [facility];  Service: Vascular;  Laterality: N/A;  . ANKLE SURGERY  2008   left ankle-otif-[facility]  . ANTERIOR LUMBAR FUSION Bilateral 06/16/2018   Procedure: LUMBAR 4-5 LUMBAR 5-SACRUM 1 ANTERIOR LUMBAR INTERBODY FUSION WITH INSTRUMENTATION AND ALLOGRAFT;  Surgeon: Phylliss Bob, MD;  Location: [facility];  Service: Orthopedics;  Laterality: Bilateral;  . BACK SURGERY    . HEMATOMA EVACUATION Left 08/13/2018   Procedure: EVACUATION HEMATOMA LEFT ABDOMINAL WALL;  Surgeon: Rosetta Posner, MD;  [facility];  Service: Vascular;  Laterality: Left;  . IR RADIOLOGIST EVAL & MGMT  07/27/2018  . IR US GUIDE BX ASP/DRAIN  07/14/2018  . JOINT REPLACEMENT     both hips replaced   . LUMBAR LAMINECTOMY/DECOMPRESSION MICRODISCECTOMY Right 10/17/2014   Procedure: LUMBAR LAMINECTOMY/DECOMPRESSION MICRODISCECTOMY 2 LEVELS;  Surgeon: [name], MD;  Location: [facility];  Service: Neurosurgery;  Laterality: Right;  Right L45 L5S1 laminectomy and foraminotomy  . MASS EXCISION  09/13/2012   Procedure: EXCISION MASS;  Surgeon: Jamesetta So, MD;  [facility];  Service: General;  Laterality: N/A;  . ROBOTIC ASSITED PARTIAL NEPHRECTOMY Left 03/30/2019   Procedure: XI ROBOTIC ASSITED PARTIAL NEPHRECTOMY POSSIBLE RADICAL NEPHRECTOMY;  Surgeon: Ceasar Mons, MD;  [facility];  Service: Urology;  Laterality: Left;  . TONSILLECTOMY    . TOTAL HIP ARTHROPLASTY Right 03/20/2015  . TOTAL HIP ARTHROPLASTY Right 03/20/2015   Procedure: TOTAL HIP ARTHROPLASTY ANTERIOR APPROACH;  Surgeon: Renette Butters, MD;  Location: [facility];  Service: Orthopedics;  Laterality: Right;  . TOTAL HIP ARTHROPLASTY Left 01/08/2016  Procedure: TOTAL HIP ARTHROPLASTY ANTERIOR APPROACH;  Surgeon: Renette Butters, MD;  Location: [facility];  Service: Orthopedics;  Laterality: Left;   Social History   Occupational History  . Not on file  Tobacco Use  . Smoking status: Current Some Day Smoker    Packs/day: 0.25    Years: 47.00    Pack years: 11.75    Types: Cigarettes  . Smokeless tobacco: Never Used  . Tobacco comment: 2 cigarettes  Substance and Sexual Activity  . Alcohol use: No    Comment: quit 2012  . Drug use: Not Currently    Types: Cocaine    Comment: many yrs ago., last time- late 2016  . Sexual activity: Never

## 2020-01-11 ENCOUNTER — Ambulatory Visit
Admission: RE | Admit: 2020-01-11 | Discharge: 2020-01-11 | Disposition: A | Discharge status: Home / Self Care | Payer: Medicaid Other | Source: Ambulatory Visit | Attending: Orthopedic Surgery | Admitting: Orthopedic Surgery

## 2020-01-11 ENCOUNTER — Other Ambulatory Visit: Payer: Self-pay

## 2020-01-11 DIAGNOSIS — D1721 Benign lipomatous neoplasm of skin and subcutaneous tissue of right arm: Secondary | ICD-10-CM

## 2020-01-11 MED ORDER — GADOBENATE DIMEGLUMINE 529 MG/ML IV SOLN
20.0000 mL | Freq: Once | INTRAVENOUS | Status: AC | PRN
  Administered 2020-01-11: 20 mL via INTRAVENOUS

## 2020-01-13 ENCOUNTER — Ambulatory Visit (INDEPENDENT_AMBULATORY_CARE_PROVIDER_SITE_OTHER): Payer: Medicaid Other | Admitting: Orthopedic Surgery

## 2020-01-13 ENCOUNTER — Other Ambulatory Visit: Payer: Self-pay

## 2020-01-13 DIAGNOSIS — D1721 Benign lipomatous neoplasm of skin and subcutaneous tissue of right arm: Secondary | ICD-10-CM

## 2020-01-14 ENCOUNTER — Encounter: Payer: Self-pay | Admitting: Orthopedic Surgery

## 2020-01-14 NOTE — Progress Notes (Signed)
Office Visit Note   Patient: Raymond Barrera.           Date of Birth: 24-May-1961           MRN: [id number] Visit Date: 01/13/2020 Requested by: Lucia Gaskins, MD [address] [place],  [place] [address] PCP: Lucia Gaskins, MD  Subjective: Chief Complaint  Patient presents with  . Follow-up    HPI: Raymond Barrera is a patient who presents for follow-up of right latissimus lipoma MRI scan.  Scan is reviewed with the patient.  12 x 8 x 5 cm benign appearing intramuscular lipoma of the right latissimus dorsi muscle.  This is come up since last summer.  It is painful for the patient.  He also has rotator cuff pathology.  Both of these things hurt him.  Does wake him from sleep at night and he describes weakness.              ROS: All systems reviewed are negative as they relate to the chief complaint within the history of present illness.  Patient denies  fevers or chills.   Assessment & Plan: Visit Diagnoses:  1. Lipoma of right shoulder     Plan: Impression is benign-appearing lipoma on MRI scan but symptomatic with pain particularly at night as well as tenderness to palpation.  Even though this is benign-appearing I think it would be best if orthopedic oncologist excises this lipoma.  I think he could be symptomatic just from the size of it however the time course is slightly concerning along with the clinical presentation.  Plan to refer him to Dr. Magda Bernheim for excision.  Follow-up with me in about 8 weeks and we can consider getting the rotator cuff fix at that time is as well.  Follow-Up Instructions: Return in about 8 weeks (around 03/09/2020).   Orders:  Orders Placed This Encounter  Procedures  . Ambulatory referral to Orthopedic Surgery   No orders of the defined types were placed in this encounter.     Procedures: No procedures performed   Clinical Data: No additional findings.  Objective: Vital Signs: There were no vitals taken for this visit.  Physical  Exam:   Constitutional: Patient appears well-developed HEENT:  Head: Normocephalic Eyes:EOM are normal Neck: Normal range of motion Cardiovascular: Normal rate Pulmonary/chest: Effort normal Neurologic: Patient is alert Skin: Skin is warm Psychiatric: Patient has normal mood and affect    Ortho Exam: Ortho exam demonstrates tender firm mass in the latissimus region which does measure about 12 x 8 cm.  Somewhat immobile.  Patient has tenderness to palpation of the [facility] joint biceps tendon region as well as some coarseness with passive range of motion.  No evidence of frozen shoulder yet but he does have pain with range of motion of the shoulder.  No other lymphadenopathy in that shoulder region is present.  Specialty Comments:  No specialty comments available.  Imaging: No results found.   PMFS History: Patient Active Problem List   Diagnosis Date Noted  . Syncope 04/18/2019  . Chronic systolic heart failure ([place])   . Pain of both hip joints   . History of renal cell carcinoma   . Renal mass 03/30/2019  . Abdominal hematoma 08/13/2018  . Radiculopathy 06/16/2018  . Acute CVA (cerebrovascular accident) ([place]) 02/01/2017  . Diabetes mellitus ([place]) 02/01/2017  . HTN (hypertension) 02/01/2017  . Primary osteoarthritis of left hip 01/08/2016  . DJD (degenerative joint disease) 03/20/2015  . Primary osteoarthritis of  right hip 02/26/2015  . Lumbar spondylosis 10/17/2014  . Lumbago 07/25/2014  . Abnormality of gait 07/25/2014  . Difficulty in walking(719.7) 07/25/2014  . TIA (transient ischemic attack) 03/09/2013  . Cocaine abuse ([place]) 03/09/2013  . Accelerated hypertension 03/09/2013  . Hyperlipidemia 03/09/2013  . Current smoker 03/09/2013   Past Medical History:  Diagnosis Date  . Anxiety    due to the stroke  . Arthritis   . Back pain    hx of buldging disc  . Diabetes mellitus without complication ([place])    takes Metformin daily  . High cholesterol    takes Zocor  daily  . Hypertension    takes Benazepril and HCTZ  daily  . Joint pain   . Joint swelling   . Memory impairment    occassional - from stroke  . Myocardial infarction ([place]) 1987  . Pneumonia    hx of-80's  . PONV (postoperative nausea and vomiting)    took a while for him to wake up after previous anesthesia  . Shortness of breath dyspnea    do to pain  . Sleep apnea    never had a sleep study,but states Dr. Cindie Laroche says he has it  . Slurred speech   . Stroke ([place]) 08/2013   7 mini-strokes, last stroke 2017  . TIA (transient ischemic attack) 2014   x 7   . Urinary frequency     Family History  Problem Relation Age of Onset  . Heart disease Father     Past Surgical History:  Procedure Laterality Date  . ABDOMINAL EXPOSURE N/A 06/16/2018   Procedure: ABDOMINAL EXPOSURE;  Surgeon: Rosetta Posner, MD;  Location: [facility];  Service: Vascular;  Laterality: N/A;  . ANKLE SURGERY  2008   left ankle-otif-[facility]  . ANTERIOR LUMBAR FUSION Bilateral 06/16/2018   Procedure: LUMBAR 4-5 LUMBAR 5-SACRUM 1 ANTERIOR LUMBAR INTERBODY FUSION WITH INSTRUMENTATION AND ALLOGRAFT;  Surgeon: Phylliss Bob, MD;  Location: [facility];  Service: Orthopedics;  Laterality: Bilateral;  . BACK SURGERY    . HEMATOMA EVACUATION Left 08/13/2018   Procedure: EVACUATION HEMATOMA LEFT ABDOMINAL WALL;  Surgeon: Rosetta Posner, MD;  [facility];  Service: Vascular;  Laterality: Left;  . IR RADIOLOGIST EVAL & MGMT  07/27/2018  . IR US GUIDE BX ASP/DRAIN  07/14/2018  . JOINT REPLACEMENT     both hips replaced   . LUMBAR LAMINECTOMY/DECOMPRESSION MICRODISCECTOMY Right 10/17/2014   Procedure: LUMBAR LAMINECTOMY/DECOMPRESSION MICRODISCECTOMY 2 LEVELS;  Surgeon: [name], MD;  Location: [facility];  Service: Neurosurgery;  Laterality: Right;  Right L45 L5S1 laminectomy and foraminotomy  . MASS EXCISION  09/13/2012   Procedure: EXCISION MASS;  Surgeon: Jamesetta So, MD;  [facility];  Service: General;   Laterality: N/A;  . ROBOTIC ASSITED PARTIAL NEPHRECTOMY Left 03/30/2019   Procedure: XI ROBOTIC ASSITED PARTIAL NEPHRECTOMY POSSIBLE RADICAL NEPHRECTOMY;  Surgeon: Ceasar Mons, MD;  [facility];  Service: Urology;  Laterality: Left;  . TONSILLECTOMY    . TOTAL HIP ARTHROPLASTY Right 03/20/2015  . TOTAL HIP ARTHROPLASTY Right 03/20/2015   Procedure: TOTAL HIP ARTHROPLASTY ANTERIOR APPROACH;  Surgeon: Renette Butters, MD;  Location: [facility];  Service: Orthopedics;  Laterality: Right;  . TOTAL HIP ARTHROPLASTY Left 01/08/2016   Procedure: TOTAL HIP ARTHROPLASTY ANTERIOR APPROACH;  Surgeon: Renette Butters, MD;  Location: [facility];  Service: Orthopedics;  Laterality: Left;   Social History   Occupational History  . Not on file  Tobacco Use  .  Smoking status: Current Some Day Smoker    Packs/day: 0.25    Years: 47.00    Pack years: 11.75    Types: Cigarettes  . Smokeless tobacco: Never Used  . Tobacco comment: 2 cigarettes  Substance and Sexual Activity  . Alcohol use: No    Comment: quit 2012  . Drug use: Not Currently    Types: Cocaine    Comment: many yrs ago., last time- late 2016  . Sexual activity: Never

## 2020-01-18 ENCOUNTER — Telehealth: Payer: Self-pay | Admitting: Orthopedic Surgery

## 2020-01-18 NOTE — Telephone Encounter (Signed)
Patient's wife Raymond Barrera calling in reference to scheduling surgery for right shoulder. She was not able to be with him at his most recent visit with Dr. Marlou Sa on 01-13-20. Patient does not have a designated party release in his chart allowing [place] to speak with his spouse.  I called patient and obtained a verbal ok to discuss his referral to Dr. Magda Bernheim for the lipoma excision and encouraged him to complete a DPR at any [facility]. Patient's spouse is now aware of referral and will contact Dr. Dois Davenport office at [phone] to schedule an appointment.  8 week follow up with Dr. Marlou Sa was made per office note on 01-13-20.

## 2020-01-18 NOTE — Telephone Encounter (Signed)
Patient's wife called back to say appointment has been made for January 31, 2020 @2 :45pm with orthopedic oncologist Dr. Magda Bernheim. She also said Dr. Dois Davenport office has not received office notes from Dr. Marlou Sa as of today, but would like them to be sent to the following fax #: [phone].

## 2020-01-20 NOTE — Telephone Encounter (Signed)
faxed

## 2020-01-29 IMAGING — RF DG LUMBAR SPINE 2-3V
1 series · 2 of 2 positions shown · non-contrast
Comparison: MR lumbar spine of 01/14/2018

CLINICAL DATA: Posterior fusion of L4-5 and L5-S1

EXAM:
LUMBAR SPINE - 2-3 VIEW

[Series 1: run · 2 of 2 slices shown]
[im 1/2]
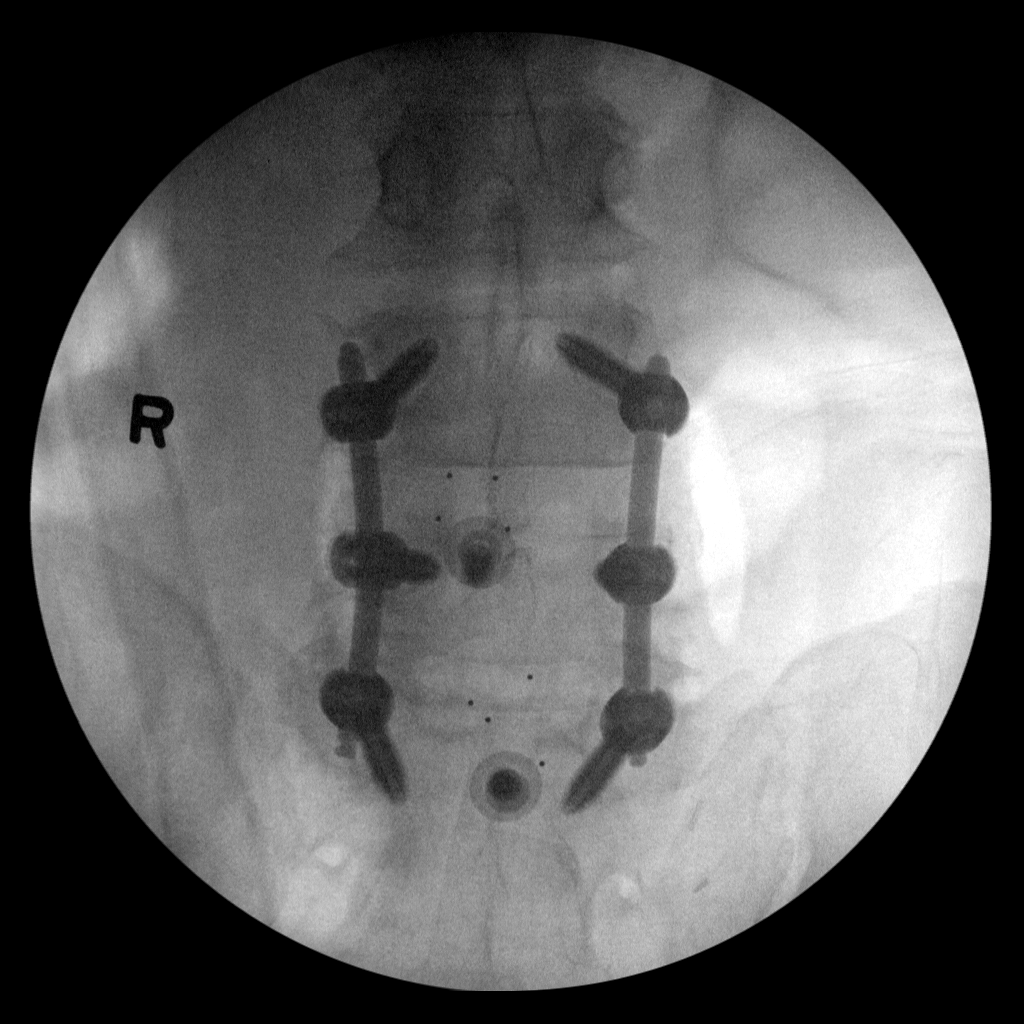
[im 2/2]
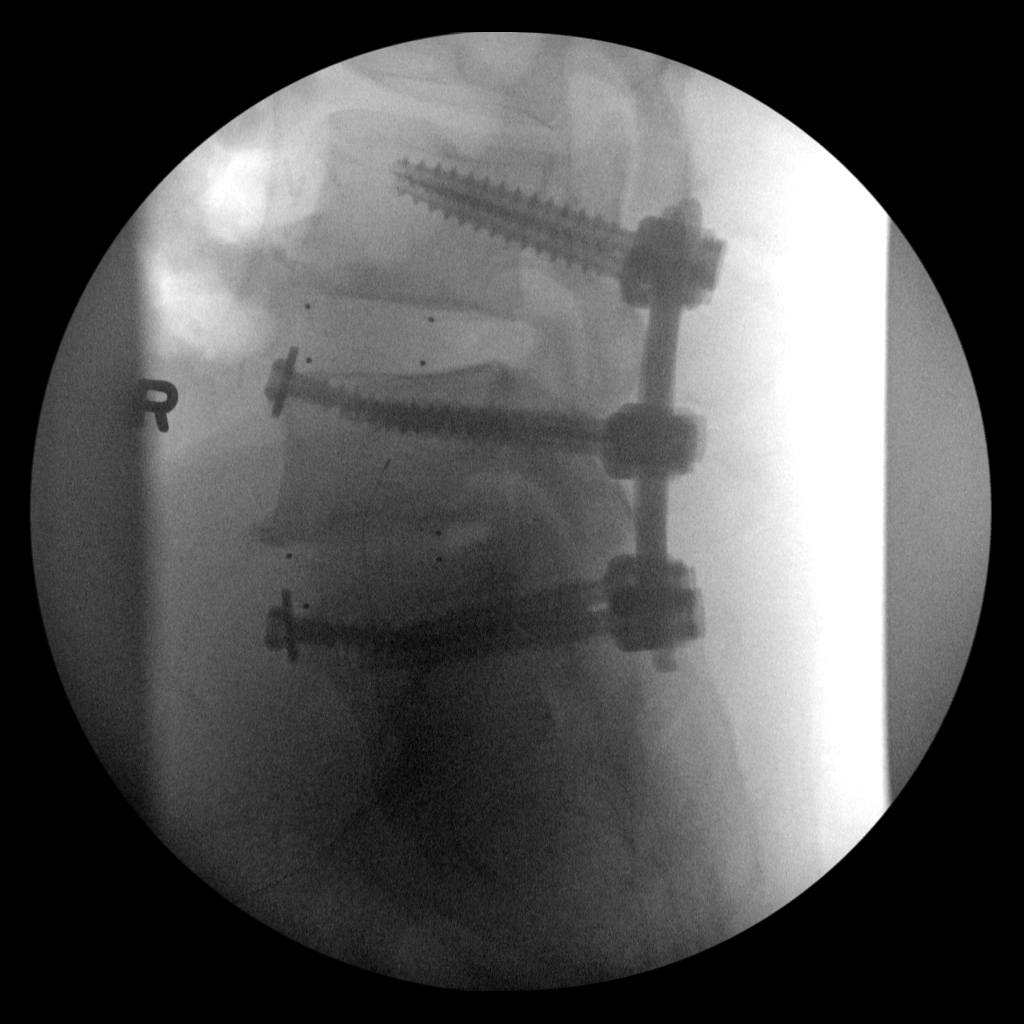

[2 of 2 positions shown; findings below may reference images not displayed]

FINDINGS: Two C-arm spot films show hardware placed for posterior fusion of
L4-5 and L5-S1. Interbody fusion plugs appear to be in good position
with no complicating features.
IMPRESSION: Posterior fusion of L4-5 and L5-S1.  No complicating features.

## 2020-01-31 DIAGNOSIS — D4819 Other specified neoplasm of uncertain behavior of connective and other soft tissue: Secondary | ICD-10-CM | POA: Insufficient documentation

## 2020-02-14 DIAGNOSIS — Z8679 Personal history of other diseases of the circulatory system: Secondary | ICD-10-CM | POA: Insufficient documentation

## 2020-03-09 ENCOUNTER — Ambulatory Visit: Payer: Medicaid Other | Admitting: Orthopedic Surgery

## 2020-03-09 NOTE — Telephone Encounter (Signed)
F/u on auth

## 2020-03-20 NOTE — Progress Notes (Addendum)
[id number] - [place], [address] [place] [address] Phone: [phone] Fax: [phone]  [facility] [phone] - [place], [place] [address] [place] [id number] [place] [address] Phone: [phone] Fax: [phone]    Your procedure is scheduled on Thursday, April 29th.  Report to [facility] Main Entrance "A" at 10:15 A.M., and check in at the Admitting office.  Call this number if you have problems the morning of surgery:  [phone]  Call [phone] if you have any questions prior to your surgery date Monday-Friday 8am-4pm   Remember:  Do not eat or drink after midnight the night before your surgery   Take these medicines the morning of surgery with A SIP OF WATER  amLODipine (NORVASC)  gabapentin (NEURONTIN)  labetalol (NORMODYNE)  If needed - HYDROcodone-acetaminophen ([place]),  ondansetron (ZOFRAN), tiZANidine (ZANAFLEX)  Follow your surgeon's instructions on when to stop Aspirin.  If no instructions were given by your surgeon then you will need to call the office to get those instructions.    As of today, STOP taking Aleve, Naproxen, Ibuprofen, Motrin, Advil, Goody's, BC's, all herbal medications, fish oil, and all vitamins.                     Do not wear jewelry.            Do not wear lotions, powders, colognes, or deodorant.            Men may shave face and neck.            Do not bring valuables to the hospital.            [facility] is not responsible for any belongings or valuables.  Do NOT Smoke (Tobacco/Vapping) or drink Alcohol 24 hours prior to your procedure If you use a CPAP at night, you may bring all equipment for your overnight stay.   Contacts, glasses, dentures or bridgework may not be worn into surgery.      For patients admitted to the hospital, discharge time will be determined by your treatment  team.   Patients discharged the day of surgery will not be allowed to drive home, and someone needs to stay with them for 24 hours.  Special instructions:   [facility]- Preparing For Surgery  Before surgery, you can play an important role. Because skin is not sterile, your skin needs to be as free of germs as possible. You can reduce the number of germs on your skin by washing with CHG (chlorahexidine gluconate) Soap before surgery.  CHG is an antiseptic cleaner which kills germs and bonds with the skin to continue killing germs even after washing.    Oral Hygiene is also important to reduce your risk of infection.  Remember - BRUSH YOUR TEETH THE MORNING OF SURGERY WITH YOUR REGULAR TOOTHPASTE  Please do not use if you have an allergy to CHG or antibacterial soaps. If your skin becomes reddened/irritated stop using the CHG.  Do not shave (including legs and underarms) for at least 48 hours prior to first CHG shower. It is OK to shave your face.  Please follow these instructions carefully.   1. Shower the NIGHT BEFORE SURGERY and the MORNING OF SURGERY with CHG Soap.   2. If you chose to wash your hair, wash your hair first  as usual with your normal shampoo.  3. After you shampoo, rinse your hair and body thoroughly to remove the shampoo.  4. Use CHG as you would any other liquid soap. You can apply CHG directly to the skin and wash gently with a scrungie or a clean washcloth.   5. Apply the CHG Soap to your body ONLY FROM THE NECK DOWN.  Do not use on open wounds or open sores. Avoid contact with your eyes, ears, mouth and genitals (private parts). Wash Face and genitals (private parts)  with your normal soap.   6. Wash thoroughly, paying special attention to the area where your surgery will be performed.  7. Thoroughly rinse your body with warm water from the neck down.  8. DO NOT shower/wash with your normal soap after using and rinsing off the CHG Soap.  9. Pat yourself dry with  a CLEAN TOWEL.  10. Wear CLEAN PAJAMAS to bed the night before surgery, wear comfortable clothes the morning of surgery  11. Place CLEAN SHEETS on your bed the night of your first shower and DO NOT SLEEP WITH PETS.  Day of Surgery: Shower with CHG soap as instructed above. Do not apply any deodorants/lotions.  Please wear clean clothes to the hospital/surgery center.   Remember to brush your teeth WITH YOUR REGULAR TOOTHPASTE.   Please read over the following fact sheets that you were given.

## 2020-03-21 ENCOUNTER — Encounter (HOSPITAL_COMMUNITY)
Admission: RE | Admit: 2020-03-21 | Discharge: 2020-03-21 | Disposition: A | Discharge status: Home / Self Care | Payer: Medicaid Other | Source: Ambulatory Visit | Attending: Orthopedic Surgery | Admitting: Orthopedic Surgery

## 2020-03-21 ENCOUNTER — Encounter (HOSPITAL_COMMUNITY): Payer: Self-pay

## 2020-03-21 ENCOUNTER — Encounter (HOSPITAL_COMMUNITY): Payer: Self-pay | Admitting: Physician Assistant

## 2020-03-21 ENCOUNTER — Other Ambulatory Visit: Payer: Self-pay

## 2020-03-21 DIAGNOSIS — Z01812 Encounter for preprocedural laboratory examination: Secondary | ICD-10-CM | POA: Diagnosis not present

## 2020-03-21 HISTORY — DX: Family history of other specified conditions: Z84.89

## 2020-03-21 LAB — CBC
HCT: 42.4 % (ref 39.0–52.0)
Hemoglobin: 13.7 g/dL (ref 13.0–17.0)
MCH: 29.7 pg (ref 26.0–34.0)
MCHC: 32.3 g/dL (ref 30.0–36.0)
MCV: 92 fL (ref 80.0–100.0)
Platelets: 203 10*3/uL (ref 150–400)
RBC: 4.61 MIL/uL (ref 4.22–5.81)
RDW: 13.8 % (ref 11.5–15.5)
WBC: 6 10*3/uL (ref 4.0–10.5)
nRBC: 0 % (ref 0.0–0.2)

## 2020-03-21 LAB — BASIC METABOLIC PANEL
Anion gap: 9 (ref 5–15)
BUN: 25 mg/dL — ABNORMAL HIGH (ref 8–23)
CO2: 24 mmol/L (ref 22–32)
Calcium: 10 mg/dL (ref 8.9–10.3)
Chloride: 105 mmol/L (ref 98–111)
Creatinine, Ser: 2.49 mg/dL — ABNORMAL HIGH (ref 0.61–1.24)
GFR calc Af Amer: 31 mL/min — ABNORMAL LOW (ref >60)
GFR calc non Af Amer: 27 mL/min — ABNORMAL LOW (ref >60)
Glucose, Bld: 184 mg/dL — ABNORMAL HIGH (ref 70–99)
Potassium: 4.9 mmol/L (ref 3.5–5.1)
Sodium: 138 mmol/L (ref 135–145)

## 2020-03-21 LAB — HEMOGLOBIN A1C
Hgb A1c MFr Bld: 7.6 % — ABNORMAL HIGH (ref 4.8–5.6)
Mean Plasma Glucose: 171.42 mg/dL

## 2020-03-21 LAB — GLUCOSE, CAPILLARY: Glucose-Capillary: 192 mg/dL — ABNORMAL HIGH (ref 70–99)

## 2020-03-21 NOTE — Progress Notes (Addendum)
PCP - Dr. Lucia Gaskins -records requested Cardiologist - Dr. Albertine Patricia Urologist - Dr. Ellison Hughs -records requested  PPM/ICD - denies  Chest x-ray - N/A EKG - 02/14/2020 (C.E) - tracing requested Stress Test - 06/15/18 ECHO - 04/19/2019 Cardiac Cath - denies  Sleep Study - denies CPAP - N/A OSA Risk Assessment Stop Bang Score: 5, routed to PCP  Fasting Blood Sugar - 106 - 121 Checks Blood Sugar 1 time a day Per wife, patient "has ran out of metformin and hasn't taken it in 6 months or more, has to scheduled an appointment with PCP to re-prescribed"  Blood Thinner Instructions: N/A Aspirin Instructions: Patient and patient's wife instructed to call the surgeon's office for instructions on when to stop taking ASA prior to surgery  ERAS Protcol - No orders  COVID TEST- Scheduled for Monday, 03/26/2020. Patient and patient's wife verbalized understanding of self-quarantine instructions, appointment time and place.  Anesthesia review: YES, cardiac hx, requested most recent EKG  BP @1420 : 137/60 taken at home, patient's wife called in reading  Patient denies shortness of breath, fever, cough and chest pain at PAT appointment  All instructions explained to the patient, with a verbal understanding of the material. Patient agrees to go over the instructions while at home for a better understanding. Patient also instructed to self quarantine after being tested for COVID-19. The opportunity to ask questions was provided.

## 2020-03-21 NOTE — Progress Notes (Signed)
   03/21/20 1043  OBSTRUCTIVE SLEEP APNEA  Have you ever been diagnosed with sleep apnea through a sleep study? No  Do you snore loudly (loud enough to be heard through closed doors)?  1  Do you often feel tired, fatigued, or sleepy during the daytime (such as falling asleep during driving or talking to someone)? 1  Has anyone observed you stop breathing during your sleep? 0  Do you have, or are you being treated for high blood pressure? 0  BMI more than 35 kg/m2? 0  Age > 50 (1-yes) 1  Neck circumference greater than:Male 16 inches or larger, Male 17inches or larger? 1  Male Gender (Yes=1) 1  Obstructive Sleep Apnea Score 5  Score 5 or greater  Results sent to PCP

## 2020-03-22 NOTE — Progress Notes (Signed)
Anesthesia Chart Review:  Follows with cardiology for history of HTN, HLD, CVAs. Last seen by Dr. Sallyanne Kuster 07/12/19. History summarized per note, "In March 2018 he had a left internal capsule ischemic stroke and he has mild right hand paresis as well as some difficulty with speech and poor short-term memory.  Prior to that in 2014 he had multiple "mini strokes" in the setting of severe hypertension.  Chronically low LVEF (45% in 2014) was markedly worse in July 2019 (30-35% by echo, 25% by QGS), but the nuclear perfusion pattern was normal. After treatment with Entresto, LVEF recovered to 50-55%, but Entresto was stopped due to renal dysfunction and symptomatic orthostatic hypotension, not long after his nephrectomy for renal cell carcinoma in late April 2020.. As he lost weight, labetalol was decreased. He had severe dizziness, was briefly hospitalized for syncope in May 2020, but the event monitor in June 2020 showed no arrhythmia during dizzy spells. He has good exercise tolerance, NYHA class 1-2, no edema, no syncope, palpitations, angina, new neuro deficits or claudication." He was advised to followup with in 1 year.  Follows with urology for hx of RCC s/p left nephrectomy 03/30/19 and CKD III. Last seen 09/01/19. Per note, renal function stable and pt feeling well.  Proep labs reviewed, creatinine 2.49 (review of labs in Epic shows baseline ~2.0-2.5). DMII reasonably controlled, A1c 7.6. Remainder of labs unremarkable.   Pt cleared for surgery by PCP Dr. Cindie Laroche.  Cardiology has recommended pt be seen in office prior to being cleared. Has appt 04/04/20. Ability to proceed pending cardiology clearance.   EKG 02/14/20 (copy on chart): Sinus rhythm. Rate 83. Moderate voltage criteria for LVH.  TTE 04/19/19:  1. The left ventricle has low normal systolic function, with an ejection fraction of 50-55%. The cavity size was normal. There is mild concentric left ventricular hypertrophy. Left ventricular  diastolic Doppler parameters are consistent with  pseudonormalization.  2. The right ventricle has normal systolic function. The cavity was normal. There is no increase in right ventricular wall thickness.  3. Left atrial size was moderately dilated.  4. There is moderate mitral annular calcification present.  5. The aortic valve is tricuspid. Moderate thickening of the aortic valve. Moderate sclerosis of the aortic valve. Aortic valve regurgitation was not assessed by color flow Doppler.  6. There is mild dilatation at the level of the sinuses of Valsalva measuring 39 mm and the ascending aorta at 10mm.   Wynonia Musty [facility] Short Stay Center/Anesthesiology Phone [phone] 03/28/2020 3:37 PM

## 2020-03-26 ENCOUNTER — Telehealth: Payer: Self-pay

## 2020-03-26 ENCOUNTER — Other Ambulatory Visit (HOSPITAL_COMMUNITY)
Admission: RE | Admit: 2020-03-26 | Discharge: 2020-03-26 | Disposition: A | Discharge status: Home / Self Care | Payer: Medicaid Other | Source: Ambulatory Visit | Attending: Orthopedic Surgery | Admitting: Orthopedic Surgery

## 2020-03-26 ENCOUNTER — Other Ambulatory Visit: Payer: Self-pay

## 2020-03-26 DIAGNOSIS — Z01812 Encounter for preprocedural laboratory examination: Secondary | ICD-10-CM | POA: Insufficient documentation

## 2020-03-26 DIAGNOSIS — Z20822 Contact with and (suspected) exposure to covid-19: Secondary | ICD-10-CM | POA: Diagnosis not present

## 2020-03-26 LAB — SARS CORONAVIRUS 2 (TAT 6-24 HRS): SARS Coronavirus 2: NEGATIVE

## 2020-03-26 NOTE — Telephone Encounter (Signed)
   [facility] Pre-operative Risk Assessment    Request for surgical clearance:  1. What type of surgery is being performed? R shoulder arthroscopy  2. When is this surgery scheduled? 03/29/20  3. What type of clearance is required (medical clearance vs. Pharmacy clearance to hold med vs. Both)? both  4. Are there any medications that need to be held prior to surgery and how long? ASA unknown  5. Practice name and name of physician performing surgery? [facility]  6. What is your office phone number [phone]   7.   What is your office fax number [phone]  8.   Anesthesia type (None, local, MAC, general) ? General and [name] 03/26/2020, 5:56 PM  _________________________________________________________________   (provider comments below)

## 2020-03-27 NOTE — Telephone Encounter (Signed)
Message sent to scheduling team to arrange pre op appt. Surgery is scheduled for 03/29/20.

## 2020-03-27 NOTE — Telephone Encounter (Signed)
   Primary Cardiologist:Mihai Croitoru, MD  Chart reviewed as part of pre-operative protocol coverage. Because of Raymond FASCIANO Jr.'s past medical history and time since last visit, he/she will require a follow-up visit in order to better assess preoperative cardiovascular risk.  Pre-op covering staff: - Please schedule appointment and call patient to inform them. - Please contact requesting surgeon's office via preferred method (i.e, phone, fax) to inform them of need for appointment prior to surgery.  If applicable, this message will also be routed to pharmacy pool and/or primary cardiologist for input on holding anticoagulant/antiplatelet agent as requested below so that this information is available at time of patient's appointment.   [place], [place]  03/27/2020, 4:37 PM

## 2020-03-28 ENCOUNTER — Telehealth: Payer: Self-pay | Admitting: Orthopedic Surgery

## 2020-03-28 ENCOUNTER — Telehealth: Payer: Self-pay | Admitting: General Practice

## 2020-03-28 MED ORDER — DEXTROSE 5 % IV SOLN
3.0000 g | INTRAVENOUS | Status: DC
  Filled 2020-03-28 (×2): qty 3000

## 2020-03-28 NOTE — Telephone Encounter (Signed)
FYI

## 2020-03-28 NOTE — Telephone Encounter (Signed)
LVM on wife's phone stating it is okay to accompany her husband to his appt. If they have any questions please call our office back.

## 2020-03-28 NOTE — Telephone Encounter (Signed)
Patient's right shoulder arthroscopy with Dr. Marlou Sa at [facility] on 03-29-20 has been cancelled. Cardiac clearance is needed prior to this procedure.  An appointment has been made for 04-04-20 with Cardiologist.  I spoke with patient's wife and caregiver Diane.  She is aware of the surgery cancellation.  She will make arrangements for patient to be seen by cardiologist on 5th of May.

## 2020-03-28 NOTE — Telephone Encounter (Signed)
[name] is calling requesting to attend the patient's upcoming appointment scheduled for 04/04/20 due to the patient not fulling understanding, remembering, or being able to fully communicate at times due to a past stroke. Please advise.

## 2020-03-29 ENCOUNTER — Ambulatory Visit (HOSPITAL_COMMUNITY): Admission: RE | Admit: 2020-03-29 | Payer: Medicaid Other | Source: Ambulatory Visit | Admitting: Orthopedic Surgery

## 2020-03-29 ENCOUNTER — Encounter (HOSPITAL_COMMUNITY): Admission: RE | Payer: Self-pay | Source: Ambulatory Visit

## 2020-03-29 ENCOUNTER — Telehealth: Payer: Self-pay | Admitting: Orthopedic Surgery

## 2020-03-29 SURGERY — ARTHROSCOPY, SHOULDER WITH REPAIR, ROTATOR CUFF, OPEN
Anesthesia: General | Laterality: Right

## 2020-03-29 NOTE — Telephone Encounter (Signed)
Pls advise. Thanks.  

## 2020-03-29 NOTE — Telephone Encounter (Signed)
Patient wife Raymond Barrera called requesting oxycodone for husband. Patient ran out of pain medication. Please send to [facility] in [place] . Patient phone number is [phone]

## 2020-03-30 ENCOUNTER — Other Ambulatory Visit: Payer: Self-pay | Admitting: Surgical

## 2020-03-30 ENCOUNTER — Telehealth: Payer: Self-pay | Admitting: Orthopedic Surgery

## 2020-03-30 MED ORDER — HYDROCODONE-ACETAMINOPHEN 5-325 MG PO TABS: 1.0000 | ORAL_TABLET | Freq: Every day | ORAL | 0 refills | Status: DC | PRN

## 2020-03-30 NOTE — Telephone Encounter (Signed)
The patient's wife called stating that the RX has been signed by the doctor and not the PA because of the medicaid and him being on the Opoid medication.  CB#[phone].  Thank you.

## 2020-03-30 NOTE — Telephone Encounter (Signed)
She says this needs to be done today.  The patient is in severe pain.  Previous message should say that the RX has to be signed off by the doctor, not his PA.

## 2020-03-30 NOTE — Telephone Encounter (Signed)
Pt has been scheduled to see Coletta Memos, FNP 04/04/20 for pre op clearance. I will send clearance notes to NP for upcoming appt as well as FYI to the surgeon with [facility]. I will remove from the pre op call back pool.

## 2020-03-30 NOTE — Telephone Encounter (Signed)
Wife states Tramadol and Tylenol#3 do absolutely nothing for his pain. She states he has actually been up all night in pain lastnight

## 2020-03-30 NOTE — Telephone Encounter (Signed)
Pts wife Arrie Aran called in stating she called in yesterday to get the pt a refill and no one ever filled it or called her back and now she's stating the pt doesn't have anymore medicine. Dawn stated the oxycodone 10 works best for the pt and would like a call back when it's been called in.   [phone]

## 2020-03-30 NOTE — Telephone Encounter (Signed)
Wife aware this was sent to his pharmacy

## 2020-03-30 NOTE — Telephone Encounter (Signed)
Pts wife [name] called stating the pharmacy and medicaid are requiring that Dr. Marlou Sa sign off on the prescription and she states they won't take a prescription with anyone elses signature. [name] stated the pt has no more medicine and is in pain; she would like this called in today for him. Pt would like a call back as soon as this is done.   [phone]

## 2020-04-02 ENCOUNTER — Telehealth: Payer: Self-pay

## 2020-04-02 ENCOUNTER — Other Ambulatory Visit: Payer: Self-pay | Admitting: Orthopedic Surgery

## 2020-04-02 ENCOUNTER — Telehealth: Payer: Self-pay | Admitting: Orthopedic Surgery

## 2020-04-02 MED ORDER — HYDROCODONE-ACETAMINOPHEN 5-325 MG PO TABS: 1.0000 | ORAL_TABLET | Freq: Four times a day (QID) | ORAL | 0 refills | Status: DC | PRN

## 2020-04-02 NOTE — Telephone Encounter (Signed)
Patient's wife called back stating that with the program he is on through [place], they require Dr. Marlou Sa to send it in, not the PA or medicaid will not cover the medication.  She would like to know what does she need to do since Dr. Marlou Sa is out of the office.  CB#[phone].  Thank you.

## 2020-04-02 NOTE — Telephone Encounter (Signed)
See other notes for further documentation. Pending response from Dr Dean/Luke

## 2020-04-02 NOTE — Telephone Encounter (Signed)
See other note. Waiting for response from [facility]. Dr Marlou Sa is out of the office.

## 2020-04-02 NOTE — Telephone Encounter (Signed)
Pls advise. Thanks.  

## 2020-04-02 NOTE — Telephone Encounter (Signed)
Called and explained that I am unable to do anything further until hearing back from Dr Marlou Sa or Lurena Joiner.

## 2020-04-02 NOTE — Telephone Encounter (Signed)
Sending this to you. 

## 2020-04-02 NOTE — Telephone Encounter (Signed)
Patient's wife called. Says he is out of pain medication. Would like a refill called in. Her phone number is [phone]

## 2020-04-02 NOTE — Telephone Encounter (Signed)
Can you please make sure he addresses this after surgery case?

## 2020-04-02 NOTE — Telephone Encounter (Signed)
[name] is calling again about pt's pain and pain medication request. Please call her back

## 2020-04-03 NOTE — Telephone Encounter (Signed)
IC LMVM advising done.

## 2020-04-03 NOTE — Progress Notes (Signed)
Cardiology Clinic Note   Patient Name: Raymond Barrera. Date of Encounter: 04/04/2020  Primary Care Provider:  Lucia Gaskins, MD Primary Cardiologist:  Sanda Klein, MD  Patient Profile    Raymond Barrera. Raymond Barrera. 59 year old male presents today for preoperative cardiac evaluation.  Past Medical History    Past Medical History:  Diagnosis Date  . Anxiety    due to the stroke  . Arthritis   . Back pain    hx of buldging disc  . Diabetes mellitus without complication ([place])    takes Metformin daily  . Family history of adverse reaction to anesthesia    "sometimes mom has a hard time waking up"  . High cholesterol    takes Zocor daily  . Hypertension    takes Benazepril and HCTZ  daily  . Joint pain   . Joint swelling   . Memory impairment    occassional - from stroke  . Myocardial infarction ([place]) 1987  . Pneumonia    hx of-80's  . PONV (postoperative nausea and vomiting)    took a while for him to wake up after previous anesthesia  . Shortness of breath dyspnea    do to pain  . Sleep apnea    never had a sleep study,but states Dr. Cindie Laroche says he has it  . Slurred speech   . Stroke ([place]) 08/2013   7 mini-strokes, last stroke 2017  . TIA (transient ischemic attack) 2014   x 7   . Urinary frequency    Past Surgical History:  Procedure Laterality Date  . ABDOMINAL EXPOSURE N/A 06/16/2018   Procedure: ABDOMINAL EXPOSURE;  Surgeon: Rosetta Posner, MD;  Location: [place];  Service: Vascular;  Laterality: N/A;  . ANKLE SURGERY  2008   left ankle-otif-[facility]  . ANTERIOR LUMBAR FUSION Bilateral 06/16/2018   Procedure: LUMBAR 4-5 LUMBAR 5-SACRUM 1 ANTERIOR LUMBAR INTERBODY FUSION WITH INSTRUMENTATION AND ALLOGRAFT;  Surgeon: Phylliss Bob, MD;  Location: [place];  Service: Orthopedics;  Laterality: Bilateral;  . BACK SURGERY    . HEMATOMA EVACUATION Left 08/13/2018   Procedure: EVACUATION HEMATOMA LEFT ABDOMINAL WALL;  Surgeon: Rosetta Posner, MD;  Location: [facility];   Service: Vascular;  Laterality: Left;  . IR RADIOLOGIST EVAL & MGMT  07/27/2018  . IR US GUIDE BX ASP/DRAIN  07/14/2018  . JOINT REPLACEMENT     both hips replaced   . LUMBAR LAMINECTOMY/DECOMPRESSION MICRODISCECTOMY Right 10/17/2014   Procedure: LUMBAR LAMINECTOMY/DECOMPRESSION MICRODISCECTOMY 2 LEVELS;  Surgeon: [name], MD;  Location: [place] NEURO ORS;  Service: Neurosurgery;  Laterality: Right;  Right L45 L5S1 laminectomy and foraminotomy  . MASS EXCISION  09/13/2012   Procedure: EXCISION MASS;  Surgeon: Jamesetta So, MD;  Location: [facility];  Service: General;  Laterality: N/A;  . ROBOTIC ASSITED PARTIAL NEPHRECTOMY Left 03/30/2019   Procedure: XI ROBOTIC ASSITED PARTIAL NEPHRECTOMY POSSIBLE RADICAL NEPHRECTOMY;  Surgeon: Ceasar Mons, MD;  Location: [facility];  Service: Urology;  Laterality: Left;  . TONSILLECTOMY    . TOTAL HIP ARTHROPLASTY Right 03/20/2015  . TOTAL HIP ARTHROPLASTY Right 03/20/2015   Procedure: TOTAL HIP ARTHROPLASTY ANTERIOR APPROACH;  Surgeon: Renette Butters, MD;  Location: [place];  Service: Orthopedics;  Laterality: Right;  . TOTAL HIP ARTHROPLASTY Left 01/08/2016   Procedure: TOTAL HIP ARTHROPLASTY ANTERIOR APPROACH;  Surgeon: Renette Butters, MD;  Location: [place];  Service: Orthopedics;  Laterality: Left;    Allergies  Allergies  Allergen Reactions  . Poison EMCOR  Extract [Poison Ivy Extract] Itching and Rash    History of Present Illness    Mr. Lumpp has a PMH of TIA, accelerated hypertension, acute CVA March 2018) status post lumbar spine surgery, syncope, chronic systolic heart failure, diabetes mellitus, cocaine abuse, HLD, and history of renal cell carcinoma.  March 2018 he had a left internal capsule ischemic stroke and was noted to have mild right hand paresis as well as difficulty with speech and poor short-term memory.  Prior to this and 2014 he had multiple mini strokes in the setting of severe hypertension.  He is known to have a  chronically low LVEF 45% in 2014 which was markedly worse 7/19 (30-35% by echocardiogram, 25% by Q GS) is nuclear perfusion pattern was normal.  After treatment with Entresto his LVEF improved to 50-55%.  However, his Delene Loll was stopped due to renal dysfunction and symptomatic orthostatic hypotension shortly after his nephrectomy for renal cell carcinoma in April 2020.  Due to weight loss his labetalol was decreased.  His cardiac event monitor May 2020 for severe dizziness showed no arrhythmia during his dizziness episodes.  He was last seen by Dr. Sallyanne Kuster on 07/2019 during that time he was doing well.  He had improved exercise tolerance, NYHA class I-2, no lower extremity edema, denied syncope palpitations angina, and new neurological deficits.  He presents the clinic today for follow-up evaluation and preoperative cardiac evaluation.  He states he feels well.  He recently had a lipoma removed and did well with that procedure.  He continues to be physically active doing chores around the house and yard work but is limited by his right shoulder.  He eats a low-sodium diet and enjoys eating fruits.  He is in NYHA class I-2.  I will clear him for his upcoming surgery.  I will give him salty 6 and instructions on increasing fiber diet.  I will have him follow-up with Dr. Loletha Grayer in 6 months.  Today he denies chest pain, shortness of breath, lower extremity edema, fatigue, palpitations, melena, hematuria, hemoptysis, diaphoresis, weakness, presyncope, syncope, orthopnea, and PND.   Home Medications    Prior to Admission medications   Medication Sig Start Date End Date Taking? Authorizing Provider  amLODipine (NORVASC) 10 MG tablet Take 10 mg by mouth daily.  07/19/19   [provider]  aspirin EC 81 MG tablet Take 81 mg by mouth daily.    [provider]  cholecalciferol (VITAMIN D3) 25 MCG (1000 UNIT) tablet Take 1,000 Units by mouth daily.    [provider]  gabapentin (NEURONTIN)  300 MG capsule Take 300 mg by mouth 3 (three) times daily.    [provider]  glucose 4 GM chewable tablet Chew 1 tablet by mouth as needed for low blood sugar.    [provider]  HYDROcodone-acetaminophen (NORCO/VICODIN) 5-325 MG tablet Take 1 tablet by mouth daily as needed for moderate pain. 03/30/20 03/30/21  Magnant, Gerrianne Scale, PA-C  HYDROcodone-acetaminophen (NORCO/VICODIN) 5-325 MG tablet Take 1 tablet by mouth every 6 (six) hours as needed for moderate pain. 04/02/20   Meredith Pel, MD  labetalol (NORMODYNE) 200 MG tablet Take 100 mg by mouth 2 (two) times daily.  07/19/19   [provider]  lidocaine (LIDODERM) 5 % Place 1 patch onto the skin daily. Remove & Discard patch within 12 hours or as directed by MD Patient taking differently: Place 1 patch onto the skin daily as needed (PAIN). Remove & Discard patch within 12 hours  or as directed by MD 07/10/19   Langston Masker B, PA-C  ondansetron (ZOFRAN) 4 MG tablet Take 1 tablet (4 mg total) by mouth every 8 (eight) hours as needed for nausea or vomiting. 03/30/19   Debbrah Alar, PA-C  simvastatin (ZOCOR) 40 MG tablet Take 40 mg by mouth every evening.  07/19/19   [provider]  tiZANidine (ZANAFLEX) 4 MG tablet Take 2-4 mg by mouth 2 (two) times daily as needed for muscle spasms.  02/22/19   [provider]    Family History    Family History  Problem Relation Age of Onset  . Heart disease Father    He indicated that his mother is alive. He indicated that his father is deceased.  Social History    Social History   Socioeconomic History  . Marital status: Married    Spouse name: Not on file  . Number of children: Not on file  . Years of education: Not on file  . Highest education level: Not on file  Occupational History  . Not on file  Tobacco Use  . Smoking status: Current Some Day Smoker    Packs/day: 0.25    Years: 47.00    Pack years: 11.75    Types: Cigarettes  .  Smokeless tobacco: Never Used  . Tobacco comment: 03/21/2020: haven't had any cigarettes in two weeks  Substance and Sexual Activity  . Alcohol use: No    Comment: quit 2012  . Drug use: Not Currently    Types: Cocaine    Comment: many yrs ago., last time- late 2016  . Sexual activity: Never  Other Topics Concern  . Not on file  Social History Narrative  . Not on file   Social Determinants of Health   Financial Resource Strain:   . Difficulty of Paying Living Expenses:   Food Insecurity:   . Worried About Charity fundraiser in the Last Year:   . Arboriculturist in the Last Year:   Transportation Needs:   . Film/video editor (Medical):   Marland Kitchen Lack of Transportation (Non-Medical):   Physical Activity:   . Days of Exercise per Week:   . Minutes of Exercise per Session:   Stress:   . Feeling of Stress :   Social Connections:   . Frequency of Communication with Friends and Family:   . Frequency of Social Gatherings with Friends and Family:   . Attends Religious Services:   . Active Member of Clubs or Organizations:   . Attends Archivist Meetings:   Marland Kitchen Marital Status:   Intimate Partner Violence:   . Fear of Current or Ex-Partner:   . Emotionally Abused:   Marland Kitchen Physically Abused:   . Sexually Abused:      Review of Systems    General:  No chills, fever, night sweats or weight changes.  Cardiovascular:  No chest pain, dyspnea on exertion, edema, orthopnea, palpitations, paroxysmal nocturnal dyspnea. Dermatological: No rash, lesions/masses Respiratory: No cough, dyspnea Urologic: No hematuria, dysuria Abdominal:   No nausea, vomiting, diarrhea, bright red blood per rectum, melena, or hematemesis Neurologic:  No visual changes, wkns, changes in mental status. All other systems reviewed and are otherwise negative except as noted above.  Physical Exam    VS:  BP 138/82   Pulse 80   Temp (!) 97.4 F (36.3 C)   Resp 18   Ht 6\' 6"  (1.981 m)   Wt 265 lb 6.4 oz  (120.4 kg)  SpO2 96%   BMI 30.67 kg/m  , BMI Body mass index is 30.67 kg/m. GEN: Well nourished, well developed, in no acute distress. HEENT: normal. Neck: Supple, no JVD, carotid bruits, or masses. Cardiac: RRR, no murmurs, rubs, or gallops. No clubbing, cyanosis, edema.  Radials/DP/PT 2+ and equal bilaterally.  Respiratory:  Respirations regular and unlabored, clear to auscultation bilaterally. GI: Soft, nontender, nondistended, BS + x 4. MS: no deformity or atrophy. Skin: warm and dry, no rash. Neuro:  Strength and sensation are intact. Psych: Normal affect.  Accessory Clinical Findings    ECG personally reviewed by me today-sinus rhythm with occasional PVCs nonspecific T wave abnormality 80 bpm- No acute changes  Echocardiogram 04/19/2019 IMPRESSIONS    1. The left ventricle has low normal systolic function, with an ejection  fraction of 50-55%. The cavity size was normal. There is mild concentric  left ventricular hypertrophy. Left ventricular diastolic Doppler  parameters are consistent with  pseudonormalization.  2. The right ventricle has normal systolic function. The cavity was  normal. There is no increase in right ventricular wall thickness.  3. Left atrial size was moderately dilated.  4. There is moderate mitral annular calcification present.  5. The aortic valve is tricuspid. Moderate thickening of the aortic  valve. Moderate sclerosis of the aortic valve. Aortic valve regurgitation  was not assessed by color flow Doppler.  6. There is mild dilatation at the level of the sinuses of Valsalva  measuring 39 mm and the ascending aorta at 67mm.  Cardiac event monitor 05/23/2019  Normal sinus rhythm with normal circadian variation.  Patient activated symptom-related tracings show normal sinus rhythm.   Normal event monitor. There is no correlation between patient-reported symptoms and the rhythm.   Assessment & Plan   1.  Nonischemic  cardiomyopathy-euvolemic today.  No increased work of breathing.  Echocardiogram 04/2019 ejection fractions [id number], diastolic pseudonormalization, left atrium moderately dilated.  Not a candidate for CHF medications due to renal dysfunction.  Delene Loll was stopped. Continue labetalol 100 mg twice daily Continue amlodipine 10 mg daily Continue ASA Heart healthy low-sodium diet Increase physical activity as tolerated Daily weights  Essential hypertension-BP today 138/82 Continue labetalol 100 mg twice daily Continue amlodipine 10 mg daily Continue ASA Heart healthy low-sodium diet-salty 6 given Increase physical activity as tolerated  Hyperlipidemia-LDL 159 on 04/18/2019 Continue rosuvastatin Heart healthy low-sodium high-fiber diet Increase physical activity as tolerated Followed by PCP  Diabetes mellitus-A1c 7.6 on 03/21/2020 Followed by PCP   Preoperative cardiac evaluation right shoulder arthroscopy Orth[facility] care [place] Dr. Meredith Pel.     Primary Cardiologist: Sanda Klein, MD  Chart reviewed as part of pre-operative protocol coverage. Given past medical history and time since last visit, based on ACC/AHA guidelines, Raymond Barrera. would be at acceptable risk for the planned procedure without further cardiovascular testing.   His RCRI is a class IV risk, 11% risk of major cardiac event.  He is able to complete more than 4 METS of physical activity  I will route this recommendation to the requesting party via Epic fax function and remove from pre-op pool.  Please call with questions.   Disposition: Follow-up with Dr. Sallyanne Kuster in 6 months.   Jossie Ng. Raymond Hemenway NP-C    04/04/2020, 10:44 AM [address] [address] Office [phone] Fax [phone]

## 2020-04-03 NOTE — Telephone Encounter (Signed)
I sent it in yesterday

## 2020-04-04 ENCOUNTER — Encounter: Payer: Self-pay | Admitting: General Practice

## 2020-04-04 ENCOUNTER — Other Ambulatory Visit: Payer: Self-pay

## 2020-04-04 ENCOUNTER — Ambulatory Visit (INDEPENDENT_AMBULATORY_CARE_PROVIDER_SITE_OTHER): Payer: Medicaid Other | Admitting: General Practice

## 2020-04-04 VITALS — BP 138/82 | HR 80 | Temp 97.4°F | Resp 18 | Ht 78.0 in | Wt 265.4 lb

## 2020-04-04 DIAGNOSIS — Z01810 Encounter for preprocedural cardiovascular examination: Secondary | ICD-10-CM

## 2020-04-04 DIAGNOSIS — I428 Other cardiomyopathies: Secondary | ICD-10-CM

## 2020-04-04 DIAGNOSIS — E785 Hyperlipidemia, unspecified: Secondary | ICD-10-CM

## 2020-04-04 DIAGNOSIS — I10 Essential (primary) hypertension: Secondary | ICD-10-CM

## 2020-04-04 DIAGNOSIS — E119 Type 2 diabetes mellitus without complications: Secondary | ICD-10-CM

## 2020-04-04 NOTE — Patient Instructions (Signed)
Medication Instructions:  Your physician recommends that you continue on your current medications as directed. Please refer to the Current Medication list given to you today.  *If you need a refill on your cardiac medications before your next appointment, please call your pharmacy*  Follow-Up: At [facility], you and your health needs are our priority.  As part of our continuing mission to provide you with exceptional heart care, we have created designated Provider Care Teams.  These Care Teams include your primary Cardiologist (physician) and Advanced Practice Providers (APPs -  Physician Assistants and Nurse Practitioners) who all work together to provide you with the care you need, when you need it.  We recommend signing up for the patient portal called "MyChart".  Sign up information is provided on this After Visit Summary.  MyChart is used to connect with patients for Virtual Visits (Telemedicine).  Patients are able to view lab/test results, encounter notes, upcoming appointments, etc.  Non-urgent messages can be sent to your provider as well.   To learn more about what you can do with MyChart, go to [email or web address].    Your next appointment:   6 month(s)  The format for your next appointment:   In Person  Provider:   You may see Dr. Sallyanne Kuster or one of the following Advanced Practice Providers on your designated Care Team:    Almyra Deforest, PA-C  Fabian Sharp, [place] or   Roby Lofts, [place]    Other Instructions

## 2020-04-06 ENCOUNTER — Inpatient Hospital Stay: Payer: Medicaid Other | Admitting: Surgical

## 2020-04-10 ENCOUNTER — Other Ambulatory Visit (HOSPITAL_COMMUNITY)
Admission: RE | Admit: 2020-04-10 | Discharge: 2020-04-10 | Disposition: A | Discharge status: Home / Self Care | Payer: Medicaid Other | Source: Ambulatory Visit | Attending: Orthopedic Surgery | Admitting: Orthopedic Surgery

## 2020-04-10 DIAGNOSIS — Z20822 Contact with and (suspected) exposure to covid-19: Secondary | ICD-10-CM | POA: Diagnosis not present

## 2020-04-10 DIAGNOSIS — Z01812 Encounter for preprocedural laboratory examination: Secondary | ICD-10-CM | POA: Diagnosis not present

## 2020-04-10 LAB — SARS CORONAVIRUS 2 (TAT 6-24 HRS): SARS Coronavirus 2: NEGATIVE

## 2020-04-11 ENCOUNTER — Encounter (HOSPITAL_COMMUNITY): Payer: Self-pay | Admitting: Orthopedic Surgery

## 2020-04-11 ENCOUNTER — Other Ambulatory Visit: Payer: Self-pay

## 2020-04-11 NOTE — Anesthesia Preprocedure Evaluation (Addendum)
Anesthesia Evaluation  Patient identified by MRN, date of birth, ID band Patient awake    Reviewed: Allergy & Precautions, NPO status , Patient's Chart, lab work & pertinent test results  Airway Mallampati: II  TM Distance: >3 FB Neck ROM: Full    Dental  (+) Edentulous Upper, Missing, Dental Advisory Given,    Pulmonary sleep apnea (does not wear a CPAP) , Current Smoker and Patient abstained from smoking.,    Pulmonary exam normal breath sounds clear to auscultation       Cardiovascular hypertension, Pt. on medications and Pt. on home beta blockers + CAD and + Past MI  Normal cardiovascular exam Rhythm:Regular Rate:Normal  ECG: SR, rate 80  TTE 2020 1. The left ventricle has low normal systolic function, with an ejection fraction of 50-55%. The cavity size was normal. There is mild concentric left ventricular hypertrophy. Left ventricular diastolic Doppler  parameters are consistent with pseudonormalization.  2. The right ventricle has normal systolic function. The cavity was normal. There is no increase in right ventricular wall thickness.  3. Left atrial size was moderately dilated.  4. There is moderate mitral annular calcification present.  5. The aortic valve is tricuspid. Moderate thickening of the aortic valve. Moderate sclerosis of the aortic valve. Aortic valve regurgitation was not assessed by color flow Doppler.  6. There is mild dilatation at the level of the sinuses of Valsalva measuring 39 mm and the ascending aorta at 51mm.   Stress Test 2019 Nuclear stress EF: 24%. There was no ST segment deviation noted during stress. No T wave inversion was noted during stress. This is a high risk study. The left ventricular ejection fraction is severely decreased (<30%). High risk nuclear study due to severe dilated cardiomyopathy, LVEF<30%. There is no reversible ischemia.  Cardiac Monitor 2020 Normal sinus  rhythm with normal circadian variation. Patient activated symptom-related tracings show normal sinus rhythm.   Neuro/Psych Anxiety Memory impairment TIACVA, No Residual Symptoms    GI/Hepatic negative GI ROS, Neg liver ROS,   Endo/Other  diabetes, Well Controlled, Type 2, Oral Hypoglycemic Agents  Renal/GU Renal InsufficiencyRenal disease (Cr 2)     Musculoskeletal negative musculoskeletal ROS (+)   Abdominal (+) + obese,   Peds  Hematology HLD   Anesthesia Other Findings right shoulder rotator cuff tear  Reproductive/Obstetrics                         Anesthesia Physical Anesthesia Plan  ASA: III  Anesthesia Plan: General and Regional   Post-op Pain Management:  Regional for Post-op pain   Induction: Intravenous  PONV Risk Score and Plan: 1 and Ondansetron, Midazolam, Dexamethasone and Treatment may vary due to age or medical condition  Airway Management Planned: Oral ETT  Additional Equipment:   Intra-op Plan:   Post-operative Plan: Extubation in OR  Informed Consent:   Plan Discussed with:   Anesthesia Plan Comments: (Recent PAT note by Karoline Caldwell, PA-C 03/21/20 is copied below. At that time, surgery was postponed due to pt needing cardiac clearance. Pt has since been seen by cardiology, Coletta Memos, NP on 04/04/20 and cleared. Per note, "Chart reviewed as part of pre-operative protocol coverage. Given past medical history and time since last visit, based on ACC/AHA guidelines, Kairav Pongratz. would be at acceptable risk for the planned procedure without further cardiovascular testing. His RCRI is a class IV risk, 11% risk of major cardiac event.  He is able to complete  more than 4 METS of physical activity."  Follows with cardiology for history of HTN, HLD, CVAs. Last seen by Dr. Sallyanne Kuster 07/12/19. 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 Prior to that in 2014 he had multiple "mini strokes" in the setting of severe hypertension. Chronically low LVEF (45% in 2014) was markedly worse in July 2019 (30-35% by echo, 25% by QGS), but the nuclear perfusion pattern was normal. After treatment with Entresto, LVEF recovered to 50-55%, but Entresto was stopped due to renal dysfunction and symptomatic orthostatic hypotension, not long after his nephrectomy for renal cell carcinoma in late April 2020.. As he lost weight, labetalol was decreased. He had severe dizziness, was briefly hospitalized for syncope in May 2020, but the event monitor in June 2020 showed no arrhythmia during dizzy spells. He has good exercise tolerance, NYHA class 1-2, no edema, no syncope, palpitations, angina, new neuro deficits or claudication." He was advised to followup with in 1 year.  Follows with urology for hx of RCC s/p left nephrectomy 03/30/19 and CKD III. Last seen 09/01/19. Per note, renal function stable and pt feeling well.  Proep labs reviewed, creatinine 2.49 (review of labs in Epic shows baseline ~2.0-2.5). DMII reasonably controlled, A1c 7.6. Remainder of labs unremarkable. Pt will need labs repeated DOS due to procedure being rescheduled.   Pt cleared for surgery by PCP Dr. Cindie Laroche.  EKG 02/14/20 (copy on chart): Sinus rhythm. Rate 83. Moderate voltage criteria for LVH.  TTE 04/19/19: 1. The left ventricle has low normal systolic function, with an ejection fraction of 50-55%. The cavity size was normal. There is mild concentric left ventricular hypertrophy. Left ventricular diastolic Doppler parameters are consistent with  pseudonormalization. 2. The right ventricle has normal systolic function. The cavity was normal. There is no increase in right ventricular wall thickness. 3. Left atrial size was moderately dilated. 4. There is moderate mitral annular calcification present. 5. The aortic valve  is tricuspid. Moderate thickening of the aortic valve. Moderate sclerosis of the aortic valve. Aortic valve regurgitation was not assessed by color flow Doppler. 6. There is mild dilatation at the level of the sinuses of Valsalva measuring 39 mm and the ascending aorta at 3mm.  )      Anesthesia Quick Evaluation

## 2020-04-11 NOTE — Progress Notes (Addendum)
SDW - spoke with pt wife Raymond Barrera. Wife provided pt hx and stated that she may need to accompany pt on DOS because he is forgetful and she manages all his medical needs/ medications/ signs all his legal documents for him.  Pt and pt's wife instructed to arrive to admitting office DOS by 14:30. Nothing to eat after midnight night before surgery. Clear liquids ok until 1400 day of surgery. Pt ok to take norvasc, gabapentin, labetalol, zocor, and zanaflex morning of surgery. Do not take ASA morning of surgery. Take metformin as ordered, but none on day of surgery. Wife verbalized understanding with readback. Instructions were given to check CBG as soon as pt wakes up and every 2 hrs until arrive at hospital. Glucose tabs ok to use if pt CBG below 70. zofran and norco PRN. Brush teeth morning of surgery, no jewelry, powders, lotions, colognes DOS. [phone] given to wife if any questions. Pt wife also instructed to call pharmacy (number provided to wife) for med rec. Pt is taking metformin and it was not listed under his med list.

## 2020-04-12 ENCOUNTER — Ambulatory Visit (HOSPITAL_COMMUNITY): Payer: Medicaid Other | Admitting: Certified Registered"

## 2020-04-12 ENCOUNTER — Encounter (HOSPITAL_COMMUNITY): Payer: Self-pay | Admitting: Orthopedic Surgery

## 2020-04-12 ENCOUNTER — Ambulatory Visit (HOSPITAL_COMMUNITY): Payer: Medicaid Other | Admitting: Vascular Surgery

## 2020-04-12 ENCOUNTER — Ambulatory Visit (HOSPITAL_COMMUNITY)
Admission: RE | Admit: 2020-04-12 | Discharge: 2020-04-12 | Disposition: A | Discharge status: Home / Self Care | Payer: Medicaid Other | Attending: Orthopedic Surgery | Admitting: Orthopedic Surgery

## 2020-04-12 ENCOUNTER — Other Ambulatory Visit: Payer: Self-pay

## 2020-04-12 ENCOUNTER — Encounter (HOSPITAL_COMMUNITY)
Admission: RE | Disposition: A | Discharge status: Home / Self Care | Payer: Self-pay | Source: Home / Self Care | Attending: Orthopedic Surgery

## 2020-04-12 DIAGNOSIS — Z8673 Personal history of transient ischemic attack (TIA), and cerebral infarction without residual deficits: Secondary | ICD-10-CM | POA: Insufficient documentation

## 2020-04-12 DIAGNOSIS — Z905 Acquired absence of kidney: Secondary | ICD-10-CM | POA: Insufficient documentation

## 2020-04-12 DIAGNOSIS — M75101 Unspecified rotator cuff tear or rupture of right shoulder, not specified as traumatic: Secondary | ICD-10-CM | POA: Insufficient documentation

## 2020-04-12 DIAGNOSIS — M1990 Unspecified osteoarthritis, unspecified site: Secondary | ICD-10-CM | POA: Diagnosis not present

## 2020-04-12 DIAGNOSIS — Z7982 Long term (current) use of aspirin: Secondary | ICD-10-CM | POA: Insufficient documentation

## 2020-04-12 DIAGNOSIS — I10 Essential (primary) hypertension: Secondary | ICD-10-CM | POA: Insufficient documentation

## 2020-04-12 DIAGNOSIS — Z8249 Family history of ischemic heart disease and other diseases of the circulatory system: Secondary | ICD-10-CM | POA: Diagnosis not present

## 2020-04-12 DIAGNOSIS — E119 Type 2 diabetes mellitus without complications: Secondary | ICD-10-CM | POA: Insufficient documentation

## 2020-04-12 DIAGNOSIS — Z79899 Other long term (current) drug therapy: Secondary | ICD-10-CM | POA: Insufficient documentation

## 2020-04-12 DIAGNOSIS — Z7984 Long term (current) use of oral hypoglycemic drugs: Secondary | ICD-10-CM | POA: Diagnosis not present

## 2020-04-12 DIAGNOSIS — Z96649 Presence of unspecified artificial hip joint: Secondary | ICD-10-CM | POA: Insufficient documentation

## 2020-04-12 DIAGNOSIS — M7521 Bicipital tendinitis, right shoulder: Secondary | ICD-10-CM

## 2020-04-12 DIAGNOSIS — G4730 Sleep apnea, unspecified: Secondary | ICD-10-CM | POA: Diagnosis not present

## 2020-04-12 DIAGNOSIS — F17200 Nicotine dependence, unspecified, uncomplicated: Secondary | ICD-10-CM | POA: Insufficient documentation

## 2020-04-12 DIAGNOSIS — S43431D Superior glenoid labrum lesion of right shoulder, subsequent encounter: Secondary | ICD-10-CM

## 2020-04-12 DIAGNOSIS — I252 Old myocardial infarction: Secondary | ICD-10-CM | POA: Insufficient documentation

## 2020-04-12 DIAGNOSIS — M75121 Complete rotator cuff tear or rupture of right shoulder, not specified as traumatic: Secondary | ICD-10-CM

## 2020-04-12 DIAGNOSIS — E7800 Pure hypercholesterolemia, unspecified: Secondary | ICD-10-CM | POA: Insufficient documentation

## 2020-04-12 HISTORY — DX: Dependence on other enabling machines and devices: Z99.89

## 2020-04-12 HISTORY — PX: SHOULDER ARTHROSCOPY WITH ROTATOR CUFF REPAIR: SHX5685

## 2020-04-12 LAB — BASIC METABOLIC PANEL
Anion gap: 9 (ref 5–15)
BUN: 20 mg/dL (ref 8–23)
CO2: 24 mmol/L (ref 22–32)
Calcium: 9.6 mg/dL (ref 8.9–10.3)
Chloride: 106 mmol/L (ref 98–111)
Creatinine, Ser: 2.01 mg/dL — ABNORMAL HIGH (ref 0.61–1.24)
GFR calc Af Amer: 40 mL/min — ABNORMAL LOW (ref >60)
GFR calc non Af Amer: 35 mL/min — ABNORMAL LOW (ref >60)
Glucose, Bld: 129 mg/dL — ABNORMAL HIGH (ref 70–99)
Potassium: 4.4 mmol/L (ref 3.5–5.1)
Sodium: 139 mmol/L (ref 135–145)

## 2020-04-12 LAB — GLUCOSE, CAPILLARY
Glucose-Capillary: 94 mg/dL (ref 70–99)
Glucose-Capillary: 107 mg/dL — ABNORMAL HIGH (ref 70–99)

## 2020-04-12 LAB — CBC
HCT: 39.7 % (ref 39.0–52.0)
Hemoglobin: 12.9 g/dL — ABNORMAL LOW (ref 13.0–17.0)
MCH: 29.5 pg (ref 26.0–34.0)
MCHC: 32.5 g/dL (ref 30.0–36.0)
MCV: 90.6 fL (ref 80.0–100.0)
Platelets: 237 10*3/uL (ref 150–400)
RBC: 4.38 MIL/uL (ref 4.22–5.81)
RDW: 13.9 % (ref 11.5–15.5)
WBC: 7.3 10*3/uL (ref 4.0–10.5)
nRBC: 0 % (ref 0.0–0.2)

## 2020-04-12 SURGERY — ARTHROSCOPY, SHOULDER, WITH ROTATOR CUFF REPAIR
Anesthesia: Regional | Site: Shoulder | Laterality: Right

## 2020-04-12 MED ORDER — PHENYLEPHRINE 40 MCG/ML (10ML) SYRINGE FOR IV PUSH (FOR BLOOD PRESSURE SUPPORT)
PREFILLED_SYRINGE | INTRAVENOUS | Status: DC | PRN
  Administered 2020-04-12: 120 ug via INTRAVENOUS
  Administered 2020-04-12 (×2): 80 ug via INTRAVENOUS

## 2020-04-12 MED ORDER — FENTANYL CITRATE (PF) 250 MCG/5ML IJ SOLN
INTRAMUSCULAR | Status: AC
  Filled 2020-04-12: qty 5

## 2020-04-12 MED ORDER — FENTANYL CITRATE (PF) 250 MCG/5ML IJ SOLN
INTRAMUSCULAR | Status: DC | PRN
  Administered 2020-04-12: 100 ug via INTRAVENOUS

## 2020-04-12 MED ORDER — FENTANYL CITRATE (PF) 100 MCG/2ML IJ SOLN
INTRAMUSCULAR | Status: AC
  Administered 2020-04-12: 50 ug
  Filled 2020-04-12: qty 2

## 2020-04-12 MED ORDER — MIDAZOLAM HCL 2 MG/2ML IJ SOLN
INTRAMUSCULAR | Status: AC
  Filled 2020-04-12: qty 2

## 2020-04-12 MED ORDER — MIDAZOLAM HCL 2 MG/2ML IJ SOLN
INTRAMUSCULAR | Status: AC
  Administered 2020-04-12: 2 mg via INTRAVENOUS
  Filled 2020-04-12: qty 2

## 2020-04-12 MED ORDER — BUPIVACAINE LIPOSOME 1.3 % IJ SUSP
INTRAMUSCULAR | Status: DC | PRN
  Administered 2020-04-12: 10 mL via PERINEURAL

## 2020-04-12 MED ORDER — PHENYLEPHRINE HCL-NACL 20-0.9 MG/250ML-% IV SOLN
INTRAVENOUS | Status: DC | PRN
  Administered 2020-04-12: 40 ug/min via INTRAVENOUS

## 2020-04-12 MED ORDER — LABETALOL HCL 5 MG/ML IV SOLN
INTRAVENOUS | Status: AC
  Filled 2020-04-12: qty 4

## 2020-04-12 MED ORDER — ACETAMINOPHEN 500 MG PO TABS
1000.0000 mg | ORAL_TABLET | Freq: Once | ORAL | Status: AC
  Administered 2020-04-12: 1000 mg via ORAL
  Filled 2020-04-12: qty 2

## 2020-04-12 MED ORDER — DEXTROSE 5 % IV SOLN
3.0000 g | INTRAVENOUS | Status: AC
  Administered 2020-04-12: 3 g via INTRAVENOUS
  Filled 2020-04-12 (×2): qty 3000

## 2020-04-12 MED ORDER — ROCURONIUM BROMIDE 10 MG/ML (PF) SYRINGE
PREFILLED_SYRINGE | INTRAVENOUS | Status: DC | PRN
  Administered 2020-04-12: 100 mg via INTRAVENOUS

## 2020-04-12 MED ORDER — PROPOFOL 10 MG/ML IV BOLUS
INTRAVENOUS | Status: DC | PRN
  Administered 2020-04-12: 50 mg via INTRAVENOUS
  Administered 2020-04-12: 150 mg via INTRAVENOUS
  Administered 2020-04-12: 50 mg via INTRAVENOUS

## 2020-04-12 MED ORDER — OXYCODONE HCL 5 MG PO TABS: 5.0000 mg | ORAL_TABLET | ORAL | 0 refills | Status: DC | PRN

## 2020-04-12 MED ORDER — EPHEDRINE SULFATE 50 MG/ML IJ SOLN
INTRAMUSCULAR | Status: DC | PRN
  Administered 2020-04-12 (×3): 5 mg via INTRAVENOUS

## 2020-04-12 MED ORDER — MIDAZOLAM HCL 2 MG/2ML IJ SOLN: 2.0000 mg | Freq: Once | INTRAMUSCULAR | Status: AC

## 2020-04-12 MED ORDER — ONDANSETRON HCL 4 MG/2ML IJ SOLN
INTRAMUSCULAR | Status: DC | PRN
  Administered 2020-04-12: 4 mg via INTRAVENOUS

## 2020-04-12 MED ORDER — PROPOFOL 10 MG/ML IV BOLUS
INTRAVENOUS | Status: AC
  Filled 2020-04-12: qty 20

## 2020-04-12 MED ORDER — POVIDONE-IODINE 7.5 % EX SOLN
Freq: Once | CUTANEOUS | Status: DC
  Filled 2020-04-12: qty 118

## 2020-04-12 MED ORDER — SUGAMMADEX SODIUM 200 MG/2ML IV SOLN
INTRAVENOUS | Status: DC | PRN
  Administered 2020-04-12 (×2): 100 mg via INTRAVENOUS
  Administered 2020-04-12: 50 mg via INTRAVENOUS

## 2020-04-12 MED ORDER — SODIUM CHLORIDE 0.9 % IR SOLN
Status: DC | PRN
  Administered 2020-04-12: 3000 mL

## 2020-04-12 MED ORDER — ROCURONIUM BROMIDE 10 MG/ML (PF) SYRINGE
PREFILLED_SYRINGE | INTRAVENOUS | Status: AC
  Filled 2020-04-12: qty 50

## 2020-04-12 MED ORDER — LACTATED RINGERS IV SOLN
INTRAVENOUS | Status: DC | PRN
Start: 2020-04-12 — End: 2020-04-12

## 2020-04-12 MED ORDER — DEXAMETHASONE SODIUM PHOSPHATE 10 MG/ML IJ SOLN
INTRAMUSCULAR | Status: DC | PRN
  Administered 2020-04-12: 5 mg via INTRAVENOUS

## 2020-04-12 MED ORDER — OXYCODONE-ACETAMINOPHEN 5-325 MG PO TABS: 1.0000 | ORAL_TABLET | ORAL | 0 refills | Status: DC | PRN

## 2020-04-12 MED ORDER — ALBUMIN HUMAN 5 % IV SOLN: INTRAVENOUS | Status: DC | PRN

## 2020-04-12 MED ORDER — FENTANYL CITRATE (PF) 100 MCG/2ML IJ SOLN: 50.0000 ug | Freq: Once | INTRAMUSCULAR | Status: DC

## 2020-04-12 MED ORDER — LIDOCAINE 2% (20 MG/ML) 5 ML SYRINGE
INTRAMUSCULAR | Status: DC | PRN
  Administered 2020-04-12: 20 mg via INTRAVENOUS

## 2020-04-12 MED ORDER — LABETALOL HCL 5 MG/ML IV SOLN
5.0000 mg | INTRAVENOUS | Status: DC | PRN
  Administered 2020-04-12: 5 mg via INTRAVENOUS

## 2020-04-12 MED ORDER — SODIUM CHLORIDE 0.9 % IV SOLN: INTRAVENOUS | Status: DC

## 2020-04-12 MED ORDER — BUPIVACAINE HCL (PF) 0.5 % IJ SOLN
INTRAMUSCULAR | Status: DC | PRN
  Administered 2020-04-12: 15 mL via PERINEURAL

## 2020-04-12 MED ORDER — FENTANYL CITRATE (PF) 100 MCG/2ML IJ SOLN: 25.0000 ug | INTRAMUSCULAR | Status: DC | PRN

## 2020-04-12 MED ORDER — POVIDONE-IODINE 10 % EX SWAB
2.0000 "application " | Freq: Once | CUTANEOUS | Status: AC
  Administered 2020-04-12: 2 via TOPICAL

## 2020-04-12 MED ORDER — 0.9 % SODIUM CHLORIDE (POUR BTL) OPTIME
TOPICAL | Status: DC | PRN
  Administered 2020-04-12: 1000 mL

## 2020-04-12 SURGICAL SUPPLY — 81 items
AID PSTN UNV HD RSTRNT DISP (MISCELLANEOUS) ×1
ALCOHOL 70% 16 OZ (MISCELLANEOUS) ×2 IMPLANT
ANCH SUT FBRTK 1.3 2 TPE (Anchor) ×2 IMPLANT
ANCH SUT SWLK 19.1X4.75 (Anchor) ×2 IMPLANT
ANCHOR FBRTK 2.6 SUTURETAP 1.3 (Anchor) ×2 IMPLANT
ANCHOR SUT 1.8 FBRTK KNTLS 2SU (Anchor) ×2 IMPLANT
ANCHOR SUT BIO SW 4.75X19.1 (Anchor) ×2 IMPLANT
BLADE CUTTER GATOR 3.5 (BLADE) IMPLANT
BLADE EXCALIBUR 4.0X13 (MISCELLANEOUS) ×1 IMPLANT
BLADE SURG 11 STRL SS (BLADE) ×1 IMPLANT
COVER SURGICAL LIGHT HANDLE (MISCELLANEOUS) ×2 IMPLANT
COVER WAND RF STERILE (DRAPES) ×2 IMPLANT
DRAPE INCISE IOBAN 66X45 STRL (DRAPES) ×4 IMPLANT
DRAPE STERI 35X30 U-POUCH (DRAPES) ×2 IMPLANT
DRAPE U-SHAPE 47X51 STRL (DRAPES) ×4 IMPLANT
DRSG TEGADERM 4X4.75 (GAUZE/BANDAGES/DRESSINGS) ×4 IMPLANT
DURAPREP 26ML APPLICATOR (WOUND CARE) ×2 IMPLANT
DW OUTFLOW CASSETTE/TUBE SET (MISCELLANEOUS) ×1 IMPLANT
ELECT REM PT RETURN 9FT ADLT (ELECTROSURGICAL) ×2
ELECTRODE REM PT RTRN 9FT ADLT (ELECTROSURGICAL) ×1 IMPLANT
FILTER STRAW FLUID ASPIR (MISCELLANEOUS) ×2 IMPLANT
GAUZE SPONGE 4X4 12PLY STRL (GAUZE/BANDAGES/DRESSINGS) ×2 IMPLANT
GAUZE XEROFORM 1X8 LF (GAUZE/BANDAGES/DRESSINGS) ×2 IMPLANT
GLOVE BIOGEL PI IND STRL 7.0 (GLOVE) ×1 IMPLANT
GLOVE BIOGEL PI IND STRL 8 (GLOVE) ×1 IMPLANT
GLOVE BIOGEL PI INDICATOR 7.0 (GLOVE) ×1
GLOVE BIOGEL PI INDICATOR 8 (GLOVE) ×1
GLOVE ECLIPSE 7.0 STRL STRAW (GLOVE) ×2 IMPLANT
GLOVE ECLIPSE 8.0 STRL XLNG CF (GLOVE) ×2 IMPLANT
GOWN STRL REUS W/ TWL LRG LVL3 (GOWN DISPOSABLE) ×3 IMPLANT
GOWN STRL REUS W/TWL LRG LVL3 (GOWN DISPOSABLE) ×6
HYDROGEN PEROXIDE 16OZ (MISCELLANEOUS) ×2 IMPLANT
KIT BASIN OR (CUSTOM PROCEDURE TRAY) ×2 IMPLANT
KIT STR SPEAR 1.8 FBRTK DISP (KITS) ×1 IMPLANT
KIT TURNOVER KIT B (KITS) ×2 IMPLANT
MANIFOLD NEPTUNE II (INSTRUMENTS) ×2 IMPLANT
NDL HYPO 25X1 1.5 SAFETY (NEEDLE) ×1 IMPLANT
NDL SCORPION MULTI FIRE (NEEDLE) IMPLANT
NDL SPNL 18GX3.5 QUINCKE PK (NEEDLE) ×1 IMPLANT
NDL SUT 6 .5 CRC .975X.05 MAYO (NEEDLE) ×1 IMPLANT
NEEDLE HYPO 25X1 1.5 SAFETY (NEEDLE) ×2 IMPLANT
NEEDLE MAYO TAPER (NEEDLE) ×2
NEEDLE SCORPION MULTI FIRE (NEEDLE) ×2 IMPLANT
NEEDLE SPNL 18GX3.5 QUINCKE PK (NEEDLE) ×2 IMPLANT
NS IRRIG 1000ML POUR BTL (IV SOLUTION) ×2 IMPLANT
PACK SHOULDER (CUSTOM PROCEDURE TRAY) ×2 IMPLANT
PAD ARMBOARD 7.5X6 YLW CONV (MISCELLANEOUS) ×4 IMPLANT
PORT APPOLLO RF 90DEGREE MULTI (SURGICAL WAND) ×1 IMPLANT
RESTRAINT HEAD UNIVERSAL NS (MISCELLANEOUS) ×2 IMPLANT
SLING ARM IMMOBILIZER LRG (SOFTGOODS) ×1 IMPLANT
SOL PREP POV-IOD 4OZ 10% (MISCELLANEOUS) ×4 IMPLANT
SPONGE LAP 4X18 RFD (DISPOSABLE) ×2 IMPLANT
STRIP CLOSURE SKIN 1/2X4 (GAUZE/BANDAGES/DRESSINGS) ×2 IMPLANT
SUCTION FRAZIER HANDLE 10FR (MISCELLANEOUS) ×2
SUCTION TUBE FRAZIER 10FR DISP (MISCELLANEOUS) ×1 IMPLANT
SUT 2 FIBERLOOP 20 STRT BLUE (SUTURE)
SUT ETHILON 3 0 PS 1 (SUTURE) ×3 IMPLANT
SUT FIBERWIRE #2 38 T-5 BLUE (SUTURE)
SUT FIBERWIRE 2-0 18 17.9 3/8 (SUTURE) ×2
SUT MNCRL AB 3-0 PS2 18 (SUTURE) ×3 IMPLANT
SUT VIC AB 0 CT1 27 (SUTURE) ×4
SUT VIC AB 0 CT1 27XBRD ANBCTR (SUTURE) ×2 IMPLANT
SUT VIC AB 0 UR5 27 (SUTURE) ×1 IMPLANT
SUT VIC AB 1 CT1 27 (SUTURE) ×4
SUT VIC AB 1 CT1 27XBRD ANBCTR (SUTURE) ×1 IMPLANT
SUT VIC AB 2-0 CT1 (SUTURE) ×1 IMPLANT
SUT VIC AB 2-0 CT1 27 (SUTURE) ×2
SUT VIC AB 2-0 CT1 TAPERPNT 27 (SUTURE) ×1 IMPLANT
SUT VICRYL 0 UR6 27IN ABS (SUTURE) ×8 IMPLANT
SUTURE 2 FIBERLOOP 20 STRT BLU (SUTURE) IMPLANT
SUTURE FIBERWR #2 38 T-5 BLUE (SUTURE) IMPLANT
SUTURE FIBERWR 2-0 18 17.9 3/8 (SUTURE) IMPLANT
SYR 20ML LL LF (SYRINGE) ×4 IMPLANT
SYR 30ML LL (SYRINGE) ×2 IMPLANT
SYR TB 1ML LUER SLIP (SYRINGE) ×2 IMPLANT
TOWEL GREEN STERILE (TOWEL DISPOSABLE) ×2 IMPLANT
TOWEL GREEN STERILE FF (TOWEL DISPOSABLE) ×2 IMPLANT
TUBE CONNECTING 12X1/4 (SUCTIONS) ×1 IMPLANT
TUBING ARTHROSCOPY IRRIG 16FT (MISCELLANEOUS) ×2 IMPLANT
WAND STAR VAC 90 (SURGICAL WAND) IMPLANT
WATER STERILE IRR 1000ML POUR (IV SOLUTION) ×2 IMPLANT

## 2020-04-12 NOTE — Anesthesia Procedure Notes (Signed)
Procedure Name: Intubation Date/Time: 04/12/2020 5:23 PM Performed by: Amadeo Garnet, CRNA Pre-anesthesia Checklist: Patient identified, Emergency Drugs available, Patient being monitored and Suction available Patient Re-evaluated:Patient Re-evaluated prior to induction Oxygen Delivery Method: Circle system utilized Preoxygenation: Pre-oxygenation with 100% oxygen Induction Type: IV induction Ventilation: Mask ventilation without difficulty and Oral airway inserted - appropriate to patient size Laryngoscope Size: Glidescope and 4 Grade View: Grade I Tube type: Oral Tube size: 7.5 mm Number of attempts: 1 Airway Equipment and Method: Stylet Placement Confirmation: ETT inserted through vocal cords under direct vision and breath sounds checked- equal and bilateral Secured at: 23 cm Tube secured with: Tape Dental Injury: Teeth and Oropharynx as per pre-operative assessment  Comments: DL x1 with MAC 4 blade- only able to visualize epiglottis.  DL x1 with glidescope 4- large tonsils noted, grade 1 view, ETT place atraumatically

## 2020-04-12 NOTE — Anesthesia Procedure Notes (Signed)
Anesthesia Regional Block: Interscalene brachial plexus block   Pre-Anesthetic Checklist: ,, timeout performed, Correct Patient, Correct Site, Correct Laterality, Correct Procedure, Correct Position, site marked, Risks and benefits discussed,  Surgical consent,  Pre-op evaluation,  At surgeon's request and post-op pain management  Laterality: Right  Prep: Maximum Sterile Barrier Precautions used, chloraprep       Needles:  Injection technique: Single-shot  Needle Type: Echogenic Stimulator Needle     Needle Length: 9cm  Needle Gauge: 21     Additional Needles:   Procedures:,,,, ultrasound used (permanent image in chart),,,,  Narrative:  Start time: 04/12/2020 4:00 PM End time: 04/12/2020 4:10 PM Injection made incrementally with aspirations every 5 mL.  Performed by: Personally  Anesthesiologist: Freddrick March, MD  Additional Notes: Monitors applied. No increased pain on injection. No increased resistance to injection. Injection made in 5cc increments. Good needle visualization. Patient tolerated procedure well.

## 2020-04-12 NOTE — Transfer of Care (Signed)
Immediate Anesthesia Transfer of Care Note  Patient: Raymond Barrera.  Procedure(s) Performed: right shoulder arthroscopy, debridement, mini open rotator cuff tear repair (Right Shoulder)  Patient Location: PACU  Anesthesia Type:General  Level of Consciousness: drowsy, patient cooperative and responds to stimulation  Airway & Oxygen Therapy: Patient Spontanous Breathing and Patient connected to nasal cannula oxygen  Post-op Assessment: Report given to RN and Post -op Vital signs reviewed and stable  Post vital signs: Reviewed and stable  Last Vitals:  Vitals Value Taken Time  BP 155/103 04/12/20 2007  Temp    Pulse 81 04/12/20 2007  Resp 16 04/12/20 2007  SpO2 97 % 04/12/20 2007  Vitals shown include unvalidated device data.  Last Pain:  Vitals:   04/12/20 1625  TempSrc:   PainSc: 0-No pain      Patients Stated Pain Goal: 3 ([phone] [id number])  Complications: No apparent anesthesia complications

## 2020-04-12 NOTE — Anesthesia Postprocedure Evaluation (Signed)
Anesthesia Post Note  Patient: Raymond Barrera.  Procedure(s) Performed: right shoulder arthroscopy, debridement, mini open rotator cuff tear repair (Right Shoulder)     Patient location during evaluation: PACU Anesthesia Type: Regional and General Level of consciousness: awake and alert Pain management: pain level controlled Vital Signs Assessment: post-procedure vital signs reviewed and stable Respiratory status: spontaneous breathing, nonlabored ventilation, respiratory function stable and patient connected to nasal cannula oxygen Cardiovascular status: blood pressure returned to baseline and stable Postop Assessment: no apparent nausea or vomiting Anesthetic complications: no    Last Vitals:  Vitals:   04/12/20 2045 04/12/20 2052  BP: (!) 124/97 (!) 140/97  Pulse: 72 73  Resp: 16 18  Temp:  36.4 C  SpO2: 98% 96%    Last Pain:  Vitals:   04/12/20 2052  TempSrc:   PainSc: 0-No pain                 Effie Berkshire

## 2020-04-12 NOTE — Brief Op Note (Signed)
   04/12/2020  7:13 PM  PATIENT:  Raymond Barrera.  59 y.o. male  PRE-OPERATIVE DIAGNOSIS:  right shoulder rotator cuff tear  POST-OPERATIVE DIAGNOSIS:  right shoulder rotator cuff tear  PROCEDURE:  Procedure(s): right shoulder arthroscopy, debridement, mini open rotator cuff tear repair biceps tenodesis  SURGEON:  Surgeon(s): Meredith Pel, MD  ASSISTANT: magnant pa  ANESTHESIA:   IV sedation  EBL: 25 ml    Total I/O In: -  Out: 100 [Blood:100]  BLOOD ADMINISTERED: none  DRAINS: none   LOCAL MEDICATIONS USED:  none  SPECIMEN:  No Specimen  COUNTS:  YES  TOURNIQUET:  * No tourniquets in log *  DICTATION: .Other Dictation: Dictation Number [id number]  PLAN OF CARE: Discharge to home after PACU  PATIENT DISPOSITION:  PACU - hemodynamically stable

## 2020-04-12 NOTE — Op Note (Signed)
NAME: Raymond Barrera, Raymond Barrera MEDICAL RECORD [id number] ACCOUNT [id number] DATE OF BIRTH:09/17/1961 FACILITY: [facility] LOCATION: [facility] PHYSICIAN:GREGORY Randel Pigg, MD  OPERATIVE REPORT  DATE OF PROCEDURE:  04/12/2020  PREOPERATIVE DIAGNOSIS:  Right shoulder rotator cuff tear and biceps tendinitis.  POSTOPERATIVE DIAGNOSIS:  Right shoulder rotator cuff tear and biceps tendinitis.  PROCEDURE:   1.  Right shoulder arthroscopy with biceps tendon release.  2.  Debridement of superior labrum.  3.  Mini open rotator cuff tear repair of the infraspinatus and supraspinatus. 4.  Biceps tenodesis.  SURGEON:  Meredith Pel, MD  ASSISTANT:  Annie Main, PA.  INDICATIONS:  This is a patient with right shoulder pain refractory to nonoperative management with 2-tendon rotator cuff tear and some biceps tendinosis who presents for operative management after failure of conservative management.  PROCEDURE IN DETAIL:  The patient was brought to the operating room where general endotracheal anesthesia was induced.  Preoperative antibiotics were administered.  Timeout was called.  Right shoulder was prescrubbed with alcohol and Betadine, allowed to  air dry, prepped with DuraPrep solution and draped in sterile manner.  Charlie Pitter was used to cover the axilla as well as seal the operative field.  Posterior portal was created.  Anterior portal was created under direct visualization.  Diagnostic  arthroscopy was performed.  The patient did have a rotator cuff tear involving the supraspinatus and infraspinatus.  Mild retraction was present, but it was mobile.  Tendinitis was present and there was significant degeneration of the anterior superior  labrum.  The biceps tendon was released.  Anterior superior labrum was debrided.  The glenohumeral articular surfaces were intact.  Anteroinferior, posteroinferior glenohumeral ligaments also intact.  At this time, instruments were removed.  Portals  closed using 3-0  nylon. Then an incision made off the anterolateral margin of the acromion.  Deltoid split a measured distance of 4 cm, marked with a #1 Vicryl suture.  This was between the anterior and middle raphae.  Next, the subacromial decompression  was performed with a rasp.  The biceps tendon was identified and tenodesed under appropriate tension using 2 Arthrex knotless suture anchors.  This gave a very stable fixation of the biceps tendon.  Next, the rotator cuff tear was identified.  It  involved the infraspinatus and supraspinatus.  The tendons were mobilized.  Good mobility was achieved.  The footprint was prepared.  There was a linear cleft between the infraspinatus and supraspinatus which was closed using 2-0 FiberWire suture  inverted.  Six Mason-Allen type sutures were then placed at the edge of the tendon tear.  Then, 2 SutureTaks with 4 limbs of suture tape in each limb were placed.  Then, using the Scorpion suture passer, the 8 suture limbs were spaced out between the  infraspinatus and supraspinatus.  These suture limbs were then tied and crossed.  Three of the Vicryl sutures from each infraspinatus and supraspinatus were also crossed and these were placed into 2 SwiveLocks.  Watertight repair was achieved.  The  patient was taken through a range of motion and found to have good range of motion.  Thorough irrigation performed at this time.  Deltoid split was closed using #1 Vicryl suture, followed by interrupted inverted 0 Vicryl suture, 2-0 Vicryl suture and a  3-0 Monocryl.  Steri-Strips, Aquacel dressing applied.  The patient tolerated the procedure well without immediate complications.  Luke's assistance was required at all times for retraction, opening and closing.  His assistance was a medical necessity.  VN/NUANCE  D:04/12/2020 T:04/12/2020 JOB:[id number]

## 2020-04-12 NOTE — Progress Notes (Signed)
Called main pharmacy, requested pharma tech to come to [facility] to complete patient;s medication list.  Pharma tech phone number to request them to come, just rang, rang, rang.. No answer.  Pharmacist stated they would send someone to [facility].

## 2020-04-12 NOTE — H&P (Signed)
Raymond Kindschi. is an 59 y.o. male.   Chief Complaint: Right shoulder pain HPI: Raymond Barrera is a 59 year old patient with right shoulder pain.  Has infraspinatus and supraspinatus tendon tears with no muscle atrophy but there is retraction to the top of the humeral head.  Presents now for operative management after explained the risk benefits.  In the interim since have seen him he has had a benign lipoma excised from that latissimus region.  He is done well with that.  Past Medical History:  Diagnosis Date  . Ambulates with cane    straight - uses occasionally  . Anxiety    due to the stroke  . Arthritis   . Back pain    hx of buldging disc  . Complication of anesthesia    took a while for him to wake up after previous anesthesia  . Diabetes mellitus without complication ([place])   . Family history of adverse reaction to anesthesia    "sometimes mom has a hard time waking up"  . High cholesterol    takes Zocor daily  . Hypertension    takes Benazepril and HCTZ  daily  . Joint pain   . Joint swelling   . Memory impairment    occassional - from stroke  . Myocardial infarction ([place]) 1987  . Pneumonia    hx of-80's  . Shortness of breath dyspnea    do to pain  . Sleep apnea    never had a sleep study,but states Dr. Cindie Laroche says he has it  . Slurred speech   . Stroke ([place]) 08/2013   7 mini-strokes, last stroke 2017  . TIA (transient ischemic attack) 2014   x 7   . Urinary frequency     Past Surgical History:  Procedure Laterality Date  . ABDOMINAL EXPOSURE N/A 06/16/2018   Procedure: ABDOMINAL EXPOSURE;  Surgeon: Rosetta Posner, MD;  Location: [facility];  Service: Vascular;  Laterality: N/A;  . ANKLE SURGERY  2008   left ankle-otif-[facility]  . ANTERIOR LUMBAR FUSION Bilateral 06/16/2018   Procedure: LUMBAR 4-5 LUMBAR 5-SACRUM 1 ANTERIOR LUMBAR INTERBODY FUSION WITH INSTRUMENTATION AND ALLOGRAFT;  Surgeon: Phylliss Bob, MD;  Location: [facility];  Service: Orthopedics;  Laterality:  Bilateral;  . BACK SURGERY    . HEMATOMA EVACUATION Left 08/13/2018   Procedure: EVACUATION HEMATOMA LEFT ABDOMINAL WALL;  Surgeon: Rosetta Posner, MD;  [facility];  Service: Vascular;  Laterality: Left;  . IR RADIOLOGIST EVAL & MGMT  07/27/2018  . IR US GUIDE BX ASP/DRAIN  07/14/2018  . JOINT REPLACEMENT     both hips replaced   . LUMBAR LAMINECTOMY/DECOMPRESSION MICRODISCECTOMY Right 10/17/2014   Procedure: LUMBAR LAMINECTOMY/DECOMPRESSION MICRODISCECTOMY 2 LEVELS;  Surgeon: [name], MD;  Location: [facility];  Service: Neurosurgery;  Laterality: Right;  Right L45 L5S1 laminectomy and foraminotomy  . MASS EXCISION  09/13/2012   Procedure: EXCISION MASS;  Surgeon: Jamesetta So, MD;  [facility];  Service: General;  Laterality: N/A;  . ROBOTIC ASSITED PARTIAL NEPHRECTOMY Left 03/30/2019   Procedure: XI ROBOTIC ASSITED PARTIAL NEPHRECTOMY POSSIBLE RADICAL NEPHRECTOMY;  Surgeon: Ceasar Mons, MD;  [facility];  Service: Urology;  Laterality: Left;  . TONSILLECTOMY    . TOTAL HIP ARTHROPLASTY Right 03/20/2015  . TOTAL HIP ARTHROPLASTY Right 03/20/2015   Procedure: TOTAL HIP ARTHROPLASTY ANTERIOR APPROACH;  Surgeon: Renette Butters, MD;  Location: [facility];  Service: Orthopedics;  Laterality: Right;  . TOTAL HIP ARTHROPLASTY Left 01/08/2016  Procedure: TOTAL HIP ARTHROPLASTY ANTERIOR APPROACH;  Surgeon: Renette Butters, MD;  Location: [facility];  Service: Orthopedics;  Laterality: Left;    Family History  Problem Relation Age of Onset  . Heart disease Father    Social History:  reports that he has been smoking cigarettes. He has a 11.75 pack-year smoking history. He has never used smokeless tobacco. He reports previous alcohol use. He reports previous drug use. Drug: Cocaine.  Allergies:  Allergies  Allergen Reactions  . Poison Ivy Extract [Poison Ivy Extract] Itching and Rash    Medications Prior to Admission  Medication Sig Dispense Refill  . amLODipine  (NORVASC) 10 MG tablet Take 10 mg by mouth daily.     Marland Kitchen aspirin EC 81 MG tablet Take 81 mg by mouth daily.    . cholecalciferol (VITAMIN D3) 25 MCG (1000 UNIT) tablet Take 1,000 Units by mouth daily.    Marland Kitchen gabapentin (NEURONTIN) 300 MG capsule Take 300 mg by mouth 3 (three) times daily.    Marland Kitchen glucose 4 GM chewable tablet Chew 1 tablet by mouth as needed for low blood sugar.    . HYDROcodone-acetaminophen (NORCO/VICODIN) 5-325 MG tablet Take 1 tablet by mouth every 6 (six) hours as needed for moderate pain. 30 tablet 0  . labetalol (NORMODYNE) 200 MG tablet Take 100 mg by mouth 2 (two) times daily.     . metFORMIN (GLUCOPHAGE) 500 MG tablet Take by mouth 2 (two) times daily with a meal.    . ondansetron (ZOFRAN) 4 MG tablet Take 1 tablet (4 mg total) by mouth every 8 (eight) hours as needed for nausea or vomiting. 20 tablet 0  . simvastatin (ZOCOR) 40 MG tablet Take 40 mg by mouth every evening.     Marland Kitchen tiZANidine (ZANAFLEX) 4 MG tablet Take 2-4 mg by mouth 2 (two) times daily as needed for muscle spasms.     Marland Kitchen HYDROcodone-acetaminophen (NORCO/VICODIN) 5-325 MG tablet Take 1 tablet by mouth daily as needed for moderate pain. 15 tablet 0  . lidocaine (LIDODERM) 5 % Place 1 patch onto the skin daily. Remove & Discard patch within 12 hours or as directed by MD (Patient taking differently: Place 1 patch onto the skin daily as needed (PAIN). Remove & Discard patch within 12 hours or as directed by MD) 30 patch 0    Results for orders placed or performed during the hospital encounter of 04/12/20 (from the past 48 hour(s))  CBC     Status: Abnormal   Collection Time: 04/12/20  2:25 PM  Result Value Ref Range   WBC 7.3 4.0 - 10.5 K/uL   RBC 4.38 4.22 - 5.81 MIL/uL   Hemoglobin 12.9 (L) 13.0 - 17.0 g/dL   HCT 39.7 39.0 - 52.0 %   MCV 90.6 80.0 - 100.0 fL   MCH 29.5 26.0 - 34.0 pg   MCHC 32.5 30.0 - 36.0 g/dL   RDW 13.9 11.5 - 15.5 %   Platelets 237 150 - 400 K/uL   nRBC 0.0 0.0 - 0.2 %    Comment:  Performed at [facility], [place] [address] [place],  [id number]  Basic metabolic panel     Status: Abnormal   Collection Time: 04/12/20  2:25 PM  Result Value Ref Range   Sodium 139 135 - 145 mmol/L   Potassium 4.4 3.5 - 5.1 mmol/L   Chloride 106 98 - 111 mmol/L   CO2 24 22 - 32 mmol/L   Glucose, Bld 129 (H)  70 - 99 mg/dL    Comment: Glucose reference range applies only to samples taken after fasting for at least 8 hours.   BUN 20 8 - 23 mg/dL   Creatinine, Ser 2.01 (H) 0.61 - 1.24 mg/dL   Calcium 9.6 8.9 - 10.3 mg/dL   GFR calc non Af Amer 35 (L) >60 mL/min   GFR calc Af Amer 40 (L) >60 mL/min   Anion gap 9 5 - 15    Comment: Performed at [facility] [address] [place], [place] [address]  Glucose, capillary     Status: None   Collection Time: 04/12/20  3:46 PM  Result Value Ref Range   Glucose-Capillary 94 70 - 99 mg/dL    Comment: Glucose reference range applies only to samples taken after fasting for at least 8 hours.   No results found.  Review of Systems  Musculoskeletal: Positive for arthralgias.  All other systems reviewed and are negative.   Blood pressure (!) 152/93, pulse 65, temperature 97.9 F (36.6 C), temperature source Oral, resp. rate 19, height 6\' 6"  (1.981 m), weight 120.4 kg, SpO2 100 %. Physical Exam  Constitutional: He appears well-developed.  HENT:  Head: Normocephalic.  Eyes: Pupils are equal, round, and reactive to light.  Cardiovascular: Normal rate.  Respiratory: Effort normal.  Musculoskeletal:     Cervical back: Normal range of motion.  Neurological: He is alert.  Skin: Skin is warm.  Psychiatric: He has a normal mood and affect.  Right arm examination demonstrates some weakness to infraspinatus more than supraspinatus testing.  No Popeye deformity.  Subscap strength is good.  Patient does have forward flexion abduction in to about 90 degrees.  Passive external rotation is roughly symmetric to the left-hand side at about  60 degrees.  Assessment/Plan Impression is right shoulder rotator cuff tear.  Plan is infraspinatus and supraspinatus tendon repair likely with biceps tenodesis.  Superior labral debridement also likely required.  Chance that we may not be able to repair in a watertight fashion the rotator cuff pathology; however, because of the absence of fatty atrophy hopefully this is a recent event.  Patient somewhat unclear about the time course of his shoulder pathology.  Plan to use CPM machine in the form of CPM brace after surgery.  All questions answered  Anderson Malta, MD 04/12/2020, 4:33 PM

## 2020-04-12 NOTE — Progress Notes (Signed)
Informed wife [name] that patient surgery is moving up ahead of schedule.  Getting ready to take patient back to the OR.

## 2020-04-12 NOTE — Progress Notes (Signed)
2nd request for pharmacy tech to come to [facility] to review patient's medication list.

## 2020-04-13 ENCOUNTER — Telehealth: Payer: Self-pay | Admitting: Orthopedic Surgery

## 2020-04-13 ENCOUNTER — Other Ambulatory Visit: Payer: Self-pay | Admitting: Orthopedic Surgery

## 2020-04-13 ENCOUNTER — Encounter: Payer: Self-pay | Admitting: *Deleted

## 2020-04-13 MED ORDER — OXYCODONE-ACETAMINOPHEN 10-325 MG PO TABS: ORAL_TABLET | ORAL | 0 refills | Status: DC

## 2020-04-13 NOTE — Telephone Encounter (Signed)
Patient's wife [name] called advised the Rx was suppose to be sent to [facility] in [place] [address]. [name] said the Rx have to be filled by doctor Marlou Sa. [name] also said the Rx should be written for Percocet 10-325. The number to contact [name] is [phone]

## 2020-04-13 NOTE — Telephone Encounter (Signed)
IC s/w [name] and advised Dr Marlou Sa submitted this rx.

## 2020-04-17 ENCOUNTER — Telehealth: Payer: Self-pay | Admitting: Orthopedic Surgery

## 2020-04-17 NOTE — Telephone Encounter (Signed)
Please advise. Thanks.  

## 2020-04-17 NOTE — Telephone Encounter (Signed)
IC s/w patient wife and advised. Verbalized understanding.

## 2020-04-17 NOTE — Telephone Encounter (Signed)
Keep going but not as many degrees

## 2020-04-17 NOTE — Telephone Encounter (Signed)
Pt called stating when using the CPM machine and it inflates he can feel a pinching from his shoulder down to his hand and it's very uncomfortable. Pt would like a call back to discuss what can be done about this.  [phone]

## 2020-04-20 ENCOUNTER — Ambulatory Visit (INDEPENDENT_AMBULATORY_CARE_PROVIDER_SITE_OTHER): Payer: Medicaid Other | Admitting: Orthopedic Surgery

## 2020-04-20 ENCOUNTER — Encounter: Payer: Self-pay | Admitting: Orthopedic Surgery

## 2020-04-20 ENCOUNTER — Other Ambulatory Visit: Payer: Self-pay

## 2020-04-20 DIAGNOSIS — S46011A Strain of muscle(s) and tendon(s) of the rotator cuff of right shoulder, initial encounter: Secondary | ICD-10-CM

## 2020-04-20 MED ORDER — OXYCODONE-ACETAMINOPHEN 10-325 MG PO TABS: ORAL_TABLET | ORAL | 0 refills | Status: DC

## 2020-04-20 NOTE — Progress Notes (Signed)
Post-Op Visit Note   Patient: Raymond Barrera.           Date of Birth: 09-11-1961           MRN: [id number] Visit Date: 04/20/2020 PCP: Lucia Gaskins, MD   Assessment & Plan:  Chief Complaint:  Chief Complaint  Patient presents with  . Right Shoulder - Follow-up   Visit Diagnoses:  1. Traumatic complete tear of right rotator cuff, initial encounter     Plan: Patient is a 59 year old male presents s/p right shoulder rotator cuff repair on 04/12/2020.  He is doing okay overall.  He is using the CPM machine 1 hour 2 times a day and is up to 60 degrees on the CPM machine.  He does note difficulty sleeping as well as pain that travels down his arm into his hand with numbness and tingling.  Sutures are intact and were removed today.  Incisions are healing well and were reapproximated with Steri-Strips.  On exam he is 80 degrees of abduction, 60 degrees of forward flexion, 25 degrees of external rotation.  Plan for patient to discontinue sling in 2 weeks.  No lifting with the operative arm or lifting the arm under its own power.  Plan to start outpatient physical therapy next week but no rotator cuff strengthening until 6 weeks out from procedure.  Follow-up in 3 weeks for clinical recheck.  Continue CPM machine.  Follow-Up Instructions: No follow-ups on file.   Orders:  Orders Placed This Encounter  Procedures  . Ambulatory referral to Occupational Therapy   Meds ordered this encounter  Medications  . oxyCODONE-acetaminophen (PERCOCET) 10-325 MG tablet    Sig: 1 po q 6-8hr prn pain    Dispense:  45 tablet    Refill:  0    Imaging: No results found.  PMFS History: Patient Active Problem List   Diagnosis Date Noted  . Syncope 04/18/2019  . Chronic systolic heart failure ([place])   . Pain of both hip joints   . History of renal cell carcinoma   . Renal mass 03/30/2019  . Abdominal hematoma 08/13/2018  . Radiculopathy 06/16/2018  . Acute CVA (cerebrovascular accident)  ([place]) 02/01/2017  . Diabetes mellitus ([place]) 02/01/2017  . HTN (hypertension) 02/01/2017  . Primary osteoarthritis of left hip 01/08/2016  . DJD (degenerative joint disease) 03/20/2015  . Primary osteoarthritis of right hip 02/26/2015  . Lumbar spondylosis 10/17/2014  . Lumbago 07/25/2014  . Abnormality of gait 07/25/2014  . Difficulty in walking(719.7) 07/25/2014  . TIA (transient ischemic attack) 03/09/2013  . Cocaine abuse ([place]) 03/09/2013  . Accelerated hypertension 03/09/2013  . Hyperlipidemia 03/09/2013  . Current smoker 03/09/2013   Past Medical History:  Diagnosis Date  . Ambulates with cane    straight - uses occasionally  . Anxiety    due to the stroke  . Arthritis   . Back pain    hx of buldging disc  . Complication of anesthesia    took a while for him to wake up after previous anesthesia  . Diabetes mellitus without complication ([place])   . Family history of adverse reaction to anesthesia    "sometimes mom has a hard time waking up"  . High cholesterol    takes Zocor daily  . Hypertension    takes Benazepril and HCTZ  daily  . Joint pain   . Joint swelling   . Memory impairment    occassional - from stroke  . Myocardial infarction ([place]) 1987  .  Pneumonia    hx of-80's  . Shortness of breath dyspnea    do to pain  . Sleep apnea    never had a sleep study,but states Dr. Cindie Laroche says he has it  . Slurred speech   . Stroke ([place]) 08/2013   7 mini-strokes, last stroke 2017  . TIA (transient ischemic attack) 2014   x 7   . Urinary frequency     Family History  Problem Relation Age of Onset  . Heart disease Father     Past Surgical History:  Procedure Laterality Date  . ABDOMINAL EXPOSURE N/A 06/16/2018   Procedure: ABDOMINAL EXPOSURE;  Surgeon: Rosetta Posner, MD;  Location: [facility];  Service: Vascular;  Laterality: N/A;  . ANKLE SURGERY  2008   left ankle-otif-[facility]  . ANTERIOR LUMBAR FUSION Bilateral 06/16/2018   Procedure: LUMBAR 4-5 LUMBAR 5-SACRUM 1  ANTERIOR LUMBAR INTERBODY FUSION WITH INSTRUMENTATION AND ALLOGRAFT;  Surgeon: Phylliss Bob, MD;  Location: [facility];  Service: Orthopedics;  Laterality: Bilateral;  . BACK SURGERY    . HEMATOMA EVACUATION Left 08/13/2018   Procedure: EVACUATION HEMATOMA LEFT ABDOMINAL WALL;  Surgeon: Rosetta Posner, MD;  [facility];  Service: Vascular;  Laterality: Left;  . IR RADIOLOGIST EVAL & MGMT  07/27/2018  . IR US GUIDE BX ASP/DRAIN  07/14/2018  . JOINT REPLACEMENT     both hips replaced   . LUMBAR LAMINECTOMY/DECOMPRESSION MICRODISCECTOMY Right 10/17/2014   Procedure: LUMBAR LAMINECTOMY/DECOMPRESSION MICRODISCECTOMY 2 LEVELS;  Surgeon: [name], MD;  Location: [facility];  Service: Neurosurgery;  Laterality: Right;  Right L45 L5S1 laminectomy and foraminotomy  . MASS EXCISION  09/13/2012   Procedure: EXCISION MASS;  Surgeon: Jamesetta So, MD;  [facility];  Service: General;  Laterality: N/A;  . ROBOTIC ASSITED PARTIAL NEPHRECTOMY Left 03/30/2019   Procedure: XI ROBOTIC ASSITED PARTIAL NEPHRECTOMY POSSIBLE RADICAL NEPHRECTOMY;  Surgeon: Ceasar Mons, MD;  [facility];  Service: Urology;  Laterality: Left;  . SHOULDER ARTHROSCOPY WITH ROTATOR CUFF REPAIR Right 04/12/2020   Procedure: right shoulder arthroscopy, debridement, mini open rotator cuff tear repair;  Surgeon: Meredith Pel, MD;  Location: [facility];  Service: Orthopedics;  Laterality: Right;  . TONSILLECTOMY    . TOTAL HIP ARTHROPLASTY Right 03/20/2015  . TOTAL HIP ARTHROPLASTY Right 03/20/2015   Procedure: TOTAL HIP ARTHROPLASTY ANTERIOR APPROACH;  Surgeon: Renette Butters, MD;  Location: [facility];  Service: Orthopedics;  Laterality: Right;  . TOTAL HIP ARTHROPLASTY Left 01/08/2016   Procedure: TOTAL HIP ARTHROPLASTY ANTERIOR APPROACH;  Surgeon: Renette Butters, MD;  Location: [facility];  Service: Orthopedics;  Laterality: Left;   Social History   Occupational History  . Not on file  Tobacco Use  . Smoking  status: Current Some Day Smoker    Packs/day: 0.25    Years: 47.00    Pack years: 11.75    Types: Cigarettes  . Smokeless tobacco: Never Used  Substance and Sexual Activity  . Alcohol use: Not Currently    Comment: quit 2012  . Drug use: Not Currently    Types: Cocaine    Comment: many yrs ago., last time- late 2016  . Sexual activity: Yes

## 2020-04-27 ENCOUNTER — Ambulatory Visit (HOSPITAL_COMMUNITY): Payer: Medicaid Other | Attending: Orthopedic Surgery | Admitting: Occupational Therapy

## 2020-04-27 ENCOUNTER — Encounter (HOSPITAL_COMMUNITY): Payer: Self-pay | Admitting: Occupational Therapy

## 2020-04-27 ENCOUNTER — Other Ambulatory Visit: Payer: Self-pay

## 2020-04-27 DIAGNOSIS — M25611 Stiffness of right shoulder, not elsewhere classified: Secondary | ICD-10-CM | POA: Diagnosis present

## 2020-04-27 DIAGNOSIS — R29898 Other symptoms and signs involving the musculoskeletal system: Secondary | ICD-10-CM | POA: Insufficient documentation

## 2020-04-27 DIAGNOSIS — M25511 Pain in right shoulder: Secondary | ICD-10-CM

## 2020-04-27 NOTE — Therapy (Signed)
[place], [place], [address] Phone: [phone]   Fax:  [phone]  Occupational Therapy Evaluation  Patient Details  Name: Raymond Barrera. MRN: [id number] Date of Birth: October 14, 1961 Referring Provider (OT): Dr. Meredith Pel    Encounter Date: 04/27/2020  OT End of Session - 04/27/20 1204    Visit Number  1    Number of Visits  16    Date for OT Re-Evaluation  06/26/20   Mini reassessment 05/26/2020   Authorization Type  Medicaid    Authorization Time Period  Requesting 3 visits    Authorization - Visit Number  0    Authorization - Number of Visits  3    OT Start Time  1121    OT Stop Time  1157    OT Time Calculation (min)  36 min    Activity Tolerance  Patient tolerated treatment well    Behavior During Therapy  [facility] for tasks assessed/performed       Past Medical History:  Diagnosis Date  . Ambulates with cane    straight - uses occasionally  . Anxiety    due to the stroke  . Arthritis   . Back pain    hx of buldging disc  . Complication of anesthesia    took a while for him to wake up after previous anesthesia  . Diabetes mellitus without complication ([place])   . Family history of adverse reaction to anesthesia    "sometimes mom has a hard time waking up"  . High cholesterol    takes Zocor daily  . Hypertension    takes Benazepril and HCTZ  daily  . Joint pain   . Joint swelling   . Memory impairment    occassional - from stroke  . Myocardial infarction ([place]) 1987  . Pneumonia    hx of-80's  . Shortness of breath dyspnea    do to pain  . Sleep apnea    never had a sleep study,but states Dr. Cindie Laroche says he has it  . Slurred speech   . Stroke ([place]) 08/2013   7 mini-strokes, last stroke 2017  . TIA (transient ischemic attack) 2014   x 7   . Urinary frequency     Past Surgical History:  Procedure Laterality Date  . ABDOMINAL EXPOSURE N/A 06/16/2018   Procedure: ABDOMINAL  EXPOSURE;  Surgeon: Rosetta Posner, MD;  Location: [facility];  Service: Vascular;  Laterality: N/A;  . ANKLE SURGERY  2008   left ankle-otif-[facility]  . ANTERIOR LUMBAR FUSION Bilateral 06/16/2018   Procedure: LUMBAR 4-5 LUMBAR 5-SACRUM 1 ANTERIOR LUMBAR INTERBODY FUSION WITH INSTRUMENTATION AND ALLOGRAFT;  Surgeon: Phylliss Bob, MD;  Location: [facility];  Service: Orthopedics;  Laterality: Bilateral;  . BACK SURGERY    . HEMATOMA EVACUATION Left 08/13/2018   Procedure: EVACUATION HEMATOMA LEFT ABDOMINAL WALL;  Surgeon: Rosetta Posner, MD;  [facility];  Service: Vascular;  Laterality: Left;  . IR RADIOLOGIST EVAL & MGMT  07/27/2018  . IR US GUIDE BX ASP/DRAIN  07/14/2018  . JOINT REPLACEMENT     both hips replaced   . LUMBAR LAMINECTOMY/DECOMPRESSION MICRODISCECTOMY Right 10/17/2014   Procedure: LUMBAR LAMINECTOMY/DECOMPRESSION MICRODISCECTOMY 2 LEVELS;  Surgeon: [name], MD;  Location: [facility];  Service: Neurosurgery;  Laterality: Right;  Right L45 L5S1 laminectomy and foraminotomy  . MASS EXCISION  09/13/2012   Procedure: EXCISION MASS;  Surgeon: Jamesetta So, MD;  Location: [facility];  Service: General;  Laterality: N/A;  . ROBOTIC ASSITED PARTIAL NEPHRECTOMY Left 03/30/2019   Procedure: XI ROBOTIC ASSITED PARTIAL NEPHRECTOMY POSSIBLE RADICAL NEPHRECTOMY;  Surgeon: Ceasar Mons, MD;  Location: [facility];  Service: Urology;  Laterality: Left;  . SHOULDER ARTHROSCOPY WITH ROTATOR CUFF REPAIR Right 04/12/2020   Procedure: right shoulder arthroscopy, debridement, mini open rotator cuff tear repair;  Surgeon: Meredith Pel, MD;  Location: [facility];  Service: Orthopedics;  Laterality: Right;  . TONSILLECTOMY    . TOTAL HIP ARTHROPLASTY Right 03/20/2015  . TOTAL HIP ARTHROPLASTY Right 03/20/2015   Procedure: TOTAL HIP ARTHROPLASTY ANTERIOR APPROACH;  Surgeon: Renette Butters, MD;  Location: [facility];  Service: Orthopedics;  Laterality: Right;  . TOTAL HIP ARTHROPLASTY Left 01/08/2016    Procedure: TOTAL HIP ARTHROPLASTY ANTERIOR APPROACH;  Surgeon: Renette Butters, MD;  Location: [facility];  Service: Orthopedics;  Laterality: Left;    There were no vitals filed for this visit.  Subjective Assessment - 04/27/20 1157    Subjective   S: I didn't take any pain medicine today    Patient is accompanied by:  Family member    Pertinent History  Pt is a 59 year old male, status post RCR on 04/12/2020. Presenting with wife for evaluation, no sling. Pt referred to occupational therapy for evaluation and treatment by Dr. Meredith Pel.    Patient Stated Goals  To have less pain and be able to use my arm again    Currently in Pain?  Yes    Pain Score  7     Pain Location  Shoulder    Pain Orientation  Right    Pain Descriptors / Indicators  Grimacing;Aching;Sharp;Sore    Pain Type  Acute pain    Pain Radiating Towards  N/A    Pain Onset  1 to 4 weeks ago    Pain Frequency  Constant    Aggravating Factors   Movement    Pain Relieving Factors  Rest, pain medicine    Effect of Pain on Daily Activities  Unable to use RUE    Multiple Pain Sites  No        [facility] OT Assessment - 04/27/20 1118      Assessment   Medical Diagnosis  Status post RCR    Referring Provider (OT)  Dr. Meredith Pel     Onset Date/Surgical Date  04/12/20    Hand Dominance  Right    Next MD Visit  05/11/2020 with Dr. Marlou Sa     Prior Therapy  none for this issue       Precautions   Precautions  Shoulder    Type of Shoulder Precautions  Weeks 0-6 (5/13-6/24) PROM, A/AROM. Weeks 6+ (6/25) AROM and progress as tolerated     Shoulder Interventions  Shoulder sling/immobilizer;For comfort      Balance Screen   Has the patient fallen in the past 6 months  No      Prior Function   Level of Independence  Independent with community mobility with device;Needs assistance with ADLs;Needs assistance with homemaking    Vocation  On disability    Leisure  Likes to do paint by numbers, mowing the grass      ADL    ADL comments  Difficulty with dressing, bathing, overhead reaching, sleep is difficult, unable to lift objects at this time.       Cognition   Overall Cognitive Status  Within Functional Limits for tasks assessed  ROM / Strength   AROM / PROM / Strength  AROM;PROM;Strength      Palpation   Palpation comment  min/mod facial restrictions at R shoulder region      PROM   Overall PROM Comments  Assessed supine, er/IR adducted    PROM Assessment Site  Shoulder    Right/Left Shoulder  Right    Right Shoulder Flexion  30 Degrees    Right Shoulder ABduction  50 Degrees    Right Shoulder Internal Rotation  90 Degrees    Right Shoulder External Rotation  33 Degrees                      OT Education - 04/27/20 1204    Education Details  Table slides    Person(s) Educated  Patient;Spouse    Methods  Explanation;Demonstration;Handout;Verbal cues    Comprehension  Verbalized understanding;Returned demonstration       OT Short Term Goals - 04/27/20 1212      OT SHORT TERM GOAL #1   Title  Patient will be educated and independent with HEP to increase functional use of right arm during daily tasks.     Time  4    Period  Weeks    Status  New    Target Date  05/27/20      OT SHORT TERM GOAL #2   Title  Patient will increase P/ROM to WNL in order to increase ability to get shirts on and off with less difficulty.     Time  4    Period  Weeks    Status  New      OT SHORT TERM GOAL #3   Title  Patient will increase RUE shoulder strength to 3+/5 overall in order to be able to return to reach items at waist to chest height.    Time  4    Period  Weeks    Status  New        OT Long Term Goals - 04/27/20 1214      OT LONG TERM GOAL #1   Title  Patient will return to highest level of functioning while using his right hand as dominant for 75% or more tasks.     Time  8    Period  Weeks    Status  New    Target Date  06/26/20      OT LONG TERM GOAL #2   Title   Patient will increase A/ROM to [facility] to increase ability to reach overhead when standing or in bed with less difficulty.    Time  8    Period  Weeks    Status  New      OT LONG TERM GOAL #3   Title  Patient will increase RUE strength to 5/5 in order to return to completing normal weighted household tasks.     Time  8    Period  Weeks    Status  New      OT LONG TERM GOAL #4   Title  Patient will report a decrease in pain level of approximately 3/10 or less to improve ability to sleep for 4 consecutive hours.    Time  8    Period  Weeks    Status  New      OT LONG TERM GOAL #5   Title  Patient will decrease fascial restrictions to min amount or less in order to increase functional mobility needed to reach above  shoulder level.    Time  8    Period  Weeks    Status  New            Plan - 04/27/20 1206    Clinical Impression Statement  P: Pt is a 59 year old male, status post RCR. [place] with deficits limiting functioning use of right upper extremity as dominant, during ADLs.    OT Occupational Profile and History  Problem Focused Assessment - Including review of records relating to presenting problem    Occupational performance deficits (Please refer to evaluation for details):  ADL's;Rest and Sleep;IADL's;Leisure    Body Structure / Function / Physical Skills  ADL;Strength;Dexterity;Pain;UE functional use;IADL;ROM;Fascial restriction;Mobility    Rehab Potential  Good    Clinical Decision Making  Limited treatment options, no task modification necessary    Comorbidities Affecting Occupational Performance:  None    Modification or Assistance to Complete Evaluation   No modification of tasks or assist necessary to complete eval    OT Frequency  2x / week    OT Duration  8 weeks    OT Treatment/Interventions  Self-care/ADL training;Moist Heat;Traction;Therapeutic activities;Therapeutic exercise;Scar mobilization;Cryotherapy;Passive range of motion;Manual Therapy;Patient/family  education;Ultrasound;Electrical Stimulation    Plan  Pt will benefit from skilled OT services to decrease pain and facial restrictions and increase strength, ROM and functional use of the RUE. Treatment plan, myofacial release and manual techniques, PROM, A/AROM, AROM, general RUE strengthening, scapular mobilty/stability and strengthening    OT Home Exercise Plan  04/25/2020: table slides    Consulted and Agree with Plan of Care  Patient;Family member/caregiver    Family Member Consulted  Wife, Diane       Patient will benefit from skilled therapeutic intervention in order to improve the following deficits and impairments:   Body Structure / Function / Physical Skills: ADL, Strength, Dexterity, Pain, UE functional use, IADL, ROM, Fascial restriction, Mobility       Visit Diagnosis: Acute pain of right shoulder  Stiffness of right shoulder, not elsewhere classified  Other symptoms and signs involving the musculoskeletal system    Problem List Patient Active Problem List   Diagnosis Date Noted  . Syncope 04/18/2019  . Chronic systolic heart failure ([place])   . Pain of both hip joints   . History of renal cell carcinoma   . Renal mass 03/30/2019  . Abdominal hematoma 08/13/2018  . Radiculopathy 06/16/2018  . Acute CVA (cerebrovascular accident) ([place]) 02/01/2017  . Diabetes mellitus ([place]) 02/01/2017  . HTN (hypertension) 02/01/2017  . Primary osteoarthritis of left hip 01/08/2016  . DJD (degenerative joint disease) 03/20/2015  . Primary osteoarthritis of right hip 02/26/2015  . Lumbar spondylosis 10/17/2014  . Lumbago 07/25/2014  . Abnormality of gait 07/25/2014  . Difficulty in walking(719.7) 07/25/2014  . TIA (transient ischemic attack) 03/09/2013  . Cocaine abuse ([place]) 03/09/2013  . Accelerated hypertension 03/09/2013  . Hyperlipidemia 03/09/2013  . Current smoker 03/09/2013    Preston Fleeting, OTR/L 04/27/2020, 12:17 PM  [facility] [address] [place], [place], [address] Phone: [phone]   Fax:  [phone]  Name: Raymond Barrera. MRN: [id number] Date of Birth: 1961-05-19

## 2020-04-27 NOTE — Patient Instructions (Signed)
1) SHOULDER: Flexion On Table   Place hands on towel placed on table, elbows straight. Lean forward with you upper body, pushing towel away from body.  _15__ reps per set, _3__ sets per day  2) Abduction (Passive)   With arm out to side, resting on towel placed on table with palm DOWN, keeping trunk away from table, lean to the side while pushing towel away from body.  Repeat __15__ times. Do __3__ sessions per day.  Copyright  VHI. All rights reserved.     3) Internal Rotation (Assistive)   Seated with elbow bent at right angle and held against side, slide arm on table surface in an inward arc keeping elbow anchored in place. Repeat _15___ times. Do __3__ sessions per day. Activity: Use this motion to brush crumbs off the table.  Copyright  VHI. All rights reserved.    

## 2020-05-01 ENCOUNTER — Telehealth: Payer: Self-pay | Admitting: Orthopedic Surgery

## 2020-05-01 NOTE — Telephone Encounter (Signed)
Patient's wife, [name], called in requesting a refill of pain medication be called in to the pharmacy.  The medication is Percocet 10-325mg  and they use the [facility] in [place].  Ms. Gawne wants to make sure Dr. Marlou Sa is the one who signs the script because patient has medicaid and they will not accept another provider.  Call back # is [phone] or [phone].

## 2020-05-01 NOTE — Telephone Encounter (Signed)
Ok fir this but at lower frequency q 6 8 thx 35

## 2020-05-01 NOTE — Telephone Encounter (Signed)
Pts wife called wanting to check on the status of the pts medication being called in. I did let her know we are waiting for the okay from the provider and she asked that we call her when he has responded.   [phone]

## 2020-05-01 NOTE — Telephone Encounter (Signed)
Please advise. Thanks.  

## 2020-05-01 NOTE — Telephone Encounter (Signed)
Will call once notified rx submitted.

## 2020-05-02 ENCOUNTER — Other Ambulatory Visit: Payer: Self-pay | Admitting: Orthopedic Surgery

## 2020-05-02 ENCOUNTER — Telehealth: Payer: Self-pay | Admitting: Orthopedic Surgery

## 2020-05-02 MED ORDER — OXYCODONE-ACETAMINOPHEN 10-325 MG PO TABS: ORAL_TABLET | ORAL | 0 refills | Status: DC

## 2020-05-02 NOTE — Telephone Encounter (Signed)
Patient's wife called. She says patient is out of pain medication and is in a lot of pain. Would like Dr. Marlou Sa to prescribe pain medication for him. Her call back number is [phone]

## 2020-05-02 NOTE — Telephone Encounter (Signed)
Patient's wife called to remind you that it has to be the provider not the PA to sign the RX.  Thank you

## 2020-05-02 NOTE — Telephone Encounter (Signed)
Please submit thanks.

## 2020-05-02 NOTE — Telephone Encounter (Signed)
Can you please submit. Patient stating Lurena Joiner cannot prescribe that it has be written by Dr Marlou Sa due to insurance.

## 2020-05-03 NOTE — Telephone Encounter (Signed)
Sent yesterday

## 2020-05-08 ENCOUNTER — Other Ambulatory Visit: Payer: Self-pay

## 2020-05-08 ENCOUNTER — Ambulatory Visit (HOSPITAL_COMMUNITY): Payer: Medicaid Other | Attending: Orthopedic Surgery | Admitting: Occupational Therapy

## 2020-05-08 ENCOUNTER — Encounter (HOSPITAL_COMMUNITY): Payer: Self-pay | Admitting: Occupational Therapy

## 2020-05-08 DIAGNOSIS — M25611 Stiffness of right shoulder, not elsewhere classified: Secondary | ICD-10-CM | POA: Diagnosis present

## 2020-05-08 DIAGNOSIS — R29898 Other symptoms and signs involving the musculoskeletal system: Secondary | ICD-10-CM

## 2020-05-08 DIAGNOSIS — M25511 Pain in right shoulder: Secondary | ICD-10-CM | POA: Diagnosis not present

## 2020-05-08 NOTE — Patient Instructions (Signed)
   1) Seated Row   Sit up straight with elbows by your sides. Pull back with shoulders/elbows, keeping forearms straight, as if pulling back on the reins of a horse. Squeeze shoulder blades together. Repeat _10-15__times, __2-3__sets/day    2) Shoulder Elevation    Sit up straight with arms by your sides. Slowly bring your shoulders up towards your ears. Repeat_10-15__times, __2-3__ sets/day    3) Shoulder Extension    Sit up straight with both arms by your side, draw your arms back behind your waist. Keep your elbows straight. Repeat __10-15__times, __2-3__sets/day.       

## 2020-05-08 NOTE — Therapy (Signed)
[place], [place], [address] Phone: [phone]   Fax:  [phone]  Occupational Therapy Treatment  Patient Details  Name: Raymond Barrera. MRN: [id number] Date of Birth: November 10, 1961 Referring Provider (OT): Dr. Meredith Pel    Encounter Date: 05/08/2020  OT End of Session - 05/08/20 1531    Visit Number  2    Number of Visits  16    Date for OT Re-Evaluation  06/26/20   Mini reassessment 05/26/2020   Authorization Type  Medicaid    Authorization Time Period  3 visits approved 6/8-6/16    Authorization - Visit Number  1    Authorization - Number of Visits  3    OT Start Time  2585    OT Stop Time  1428    OT Time Calculation (min)  41 min    Activity Tolerance  Patient tolerated treatment well    Behavior During Therapy  [facility] for tasks assessed/performed       Past Medical History:  Diagnosis Date  . Ambulates with cane    straight - uses occasionally  . Anxiety    due to the stroke  . Arthritis   . Back pain    hx of buldging disc  . Complication of anesthesia    took a while for him to wake up after previous anesthesia  . Diabetes mellitus without complication ([name])   . Family history of adverse reaction to anesthesia    "sometimes mom has a hard time waking up"  . High cholesterol    takes Zocor daily  . Hypertension    takes Benazepril and HCTZ  daily  . Joint pain   . Joint swelling   . Memory impairment    occassional - from stroke  . Myocardial infarction ([place]) 1987  . Pneumonia    hx of-80's  . Shortness of breath dyspnea    do to pain  . Sleep apnea    never had a sleep study,but states Dr. Cindie Laroche says he has it  . Slurred speech   . Stroke ([place]) 08/2013   7 mini-strokes, last stroke 2017  . TIA (transient ischemic attack) 2014   x 7   . Urinary frequency     Past Surgical History:  Procedure Laterality Date  . ABDOMINAL EXPOSURE N/A 06/16/2018   Procedure:  ABDOMINAL EXPOSURE;  Surgeon: Rosetta Posner, MD;  Location: [place];  Service: Vascular;  Laterality: N/A;  . ANKLE SURGERY  2008   left ankle-otif-[facility]  . ANTERIOR LUMBAR FUSION Bilateral 06/16/2018   Procedure: LUMBAR 4-5 LUMBAR 5-SACRUM 1 ANTERIOR LUMBAR INTERBODY FUSION WITH INSTRUMENTATION AND ALLOGRAFT;  Surgeon: Phylliss Bob, MD;  Location: [place];  Service: Orthopedics;  Laterality: Bilateral;  . BACK SURGERY    . HEMATOMA EVACUATION Left 08/13/2018   Procedure: EVACUATION HEMATOMA LEFT ABDOMINAL WALL;  Surgeon: Rosetta Posner, MD;  [facility];  Service: Vascular;  Laterality: Left;  . IR RADIOLOGIST EVAL & MGMT  07/27/2018  . IR US GUIDE BX ASP/DRAIN  07/14/2018  . JOINT REPLACEMENT     both hips replaced   . LUMBAR LAMINECTOMY/DECOMPRESSION MICRODISCECTOMY Right 10/17/2014   Procedure: LUMBAR LAMINECTOMY/DECOMPRESSION MICRODISCECTOMY 2 LEVELS;  Surgeon: [name], MD;  Location: [place] [facility];  Service: Neurosurgery;  Laterality: Right;  Right L45 L5S1 laminectomy and foraminotomy  . MASS EXCISION  09/13/2012   Procedure: EXCISION MASS;  Surgeon: Jamesetta So, MD;  [facility];  Service: General;  Laterality: N/A;  . ROBOTIC ASSITED PARTIAL NEPHRECTOMY Left 03/30/2019   Procedure: XI ROBOTIC ASSITED PARTIAL NEPHRECTOMY POSSIBLE RADICAL NEPHRECTOMY;  Surgeon: Ceasar Mons, MD;  [facility];  Service: Urology;  Laterality: Left;  . SHOULDER ARTHROSCOPY WITH ROTATOR CUFF REPAIR Right 04/12/2020   Procedure: right shoulder arthroscopy, debridement, mini open rotator cuff tear repair;  Surgeon: Meredith Pel, MD;  Location: [place];  Service: Orthopedics;  Laterality: Right;  . TONSILLECTOMY    . TOTAL HIP ARTHROPLASTY Right 03/20/2015  . TOTAL HIP ARTHROPLASTY Right 03/20/2015   Procedure: TOTAL HIP ARTHROPLASTY ANTERIOR APPROACH;  Surgeon: Renette Butters, MD;  Location: [place];  Service: Orthopedics;  Laterality: Right;  . TOTAL HIP ARTHROPLASTY Left  01/08/2016   Procedure: TOTAL HIP ARTHROPLASTY ANTERIOR APPROACH;  Surgeon: Renette Butters, MD;  Location: [place];  Service: Orthopedics;  Laterality: Left;    There were no vitals filed for this visit.  Subjective Assessment - 05/08/20 1351    Subjective   S: I took my pain meds.    Currently in Pain?  Yes    Pain Score  8     Pain Location  Shoulder    Pain Orientation  Right    Pain Descriptors / Indicators  Aching;Sore;Sharp    Pain Type  Acute pain    Pain Radiating Towards  N/A    Pain Onset  1 to 4 weeks ago    Pain Frequency  Constant    Aggravating Factors   movement    Pain Relieving Factors  rest, pain medication    Effect of Pain on Daily Activities  unable to use RUE    Multiple Pain Sites  No         [facility] OT Assessment - 05/08/20 1350      Assessment   Medical Diagnosis  Status post RCR      Precautions   Precautions  Shoulder    Type of Shoulder Precautions  Weeks 0-6 (5/13-6/24) PROM, A/AROM. Weeks 6+ (6/25) AROM and progress as tolerated     Shoulder Interventions  Shoulder sling/immobilizer;For comfort      Restrictions   Weight Bearing Restrictions  No               OT Treatments/Exercises (OP) - 05/08/20 1352      Exercises   Exercises  Shoulder      Shoulder Exercises: Supine   Protraction  PROM;10 reps    Horizontal ABduction  PROM;10 reps    External Rotation  PROM;10 reps    Internal Rotation  PROM;10 reps    Flexion  PROM;10 reps    ABduction  PROM;10 reps      Shoulder Exercises: Seated   Elevation  AROM;10 reps    Extension  AROM;10 reps    Row  AROM;10 reps    Other Seated Exercises  scapular depression, A/ROM, 10X      Shoulder Exercises: Therapy Ball   Flexion  5 reps    ABduction  5 reps      Shoulder Exercises: Isometric Strengthening   Flexion  Supine;3X3"    Extension  Supine;3X3"    External Rotation  Supine;3X3"    Internal Rotation  Supine;3X3"    ABduction  Supine;3X3"    ADduction  Supine;3X3"       Manual Therapy   Manual Therapy  Myofascial release    Manual therapy comments  manual therapy completed seperate from other  treatment interventions    Myofascial Release  myofascial release and manual techniques to right upper arm, trapezius, and scapular regions to decrease pain and fascial restrictions and increase joint ROM             OT Education - 05/08/20 1424    Education Details  scapular A/ROM    Person(s) Educated  Patient;Spouse    Methods  Explanation;Demonstration;Handout;Verbal cues    Comprehension  Verbalized understanding;Returned demonstration       OT Short Term Goals - 05/08/20 1550      OT SHORT TERM GOAL #1   Title  Patient will be educated and independent with HEP to increase functional use of right arm during daily tasks.     Time  4    Period  Weeks    Status  On-going    Target Date  05/27/20      OT SHORT TERM GOAL #2   Title  Patient will increase P/ROM to WNL in order to increase ability to get shirts on and off with less difficulty.     Time  4    Period  Weeks    Status  On-going      OT SHORT TERM GOAL #3   Title  Patient will increase RUE shoulder strength to 3+/5 overall in order to be able to return to reach items at waist to chest height.    Time  4    Period  Weeks    Status  On-going        OT Long Term Goals - 05/08/20 1550      OT LONG TERM GOAL #1   Title  Patient will return to highest level of functioning while using his right hand as dominant for 75% or more tasks.     Time  8    Period  Weeks    Status  On-going      OT LONG TERM GOAL #2   Title  Patient will increase A/ROM to [facility] to increase ability to reach overhead when standing or in bed with less difficulty.    Time  8    Period  Weeks    Status  On-going      OT LONG TERM GOAL #3   Title  Patient will increase RUE strength to 5/5 in order to return to completing normal weighted household tasks.     Time  8    Period  Weeks    Status  On-going      OT  LONG TERM GOAL #4   Title  Patient will report a decrease in pain level of approximately 3/10 or less to improve ability to sleep for 4 consecutive hours.    Time  8    Period  Weeks    Status  On-going      OT LONG TERM GOAL #5   Title  Patient will decrease fascial restrictions to min amount or less in order to increase functional mobility needed to reach above shoulder level.    Time  8    Period  Weeks    Status  On-going            Plan - 05/08/20 1531    Clinical Impression Statement  A: Pt reporting he is not completing HEP due to not having enough counter space to complete the table slides.  Initiated myofascial release and manual techniques, passive stretching, isometrics, scapular A/ROM, and therapy ball stretches today. Verbal cuing for breathing pattern  to facilitate relaxation of RUE and allow for passive stretching. Pt tolerate ROM at approximately 50-60%. Verbal cuing for form and technique.    Body Structure / Function / Physical Skills  ADL;Strength;Dexterity;Pain;UE functional use;IADL;ROM;Fascial restriction;Mobility    Plan  P: Continue with passive stretching and isometrics. Complete pendulums and add to HEP    OT Home Exercise Plan  04/25/2020: table slides; 6/8: scapular A/ROM    Consulted and Agree with Plan of Care  Patient;Family member/caregiver    Family Member Consulted  Wife, Diane       Patient will benefit from skilled therapeutic intervention in order to improve the following deficits and impairments:   Body Structure / Function / Physical Skills: ADL, Strength, Dexterity, Pain, UE functional use, IADL, ROM, Fascial restriction, Mobility       Visit Diagnosis: Acute pain of right shoulder  Stiffness of right shoulder, not elsewhere classified  Other symptoms and signs involving the musculoskeletal system    Problem List Patient Active Problem List   Diagnosis Date Noted  . Syncope 04/18/2019  . Chronic systolic heart failure ([place])   .  Pain of both hip joints   . History of renal cell carcinoma   . Renal mass 03/30/2019  . Abdominal hematoma 08/13/2018  . Radiculopathy 06/16/2018  . Acute CVA (cerebrovascular accident) ([place]) 02/01/2017  . Diabetes mellitus ([place]) 02/01/2017  . HTN (hypertension) 02/01/2017  . Primary osteoarthritis of left hip 01/08/2016  . DJD (degenerative joint disease) 03/20/2015  . Primary osteoarthritis of right hip 02/26/2015  . Lumbar spondylosis 10/17/2014  . Lumbago 07/25/2014  . Abnormality of gait 07/25/2014  . Difficulty in walking(719.7) 07/25/2014  . TIA (transient ischemic attack) 03/09/2013  . Cocaine abuse ([place]) 03/09/2013  . Accelerated hypertension 03/09/2013  . Hyperlipidemia 03/09/2013  . Current smoker 03/09/2013   Guadelupe Sabin, OTR/L  [phone] 05/08/2020, 3:51 PM  [facility] [address] [place], [place], [address] Phone: [phone]   Fax:  [phone]  Name: Raymond Barrera. MRN: [id number] Date of Birth: [birth year]

## 2020-05-11 ENCOUNTER — Ambulatory Visit (INDEPENDENT_AMBULATORY_CARE_PROVIDER_SITE_OTHER): Payer: Medicaid Other | Admitting: Orthopedic Surgery

## 2020-05-11 ENCOUNTER — Ambulatory Visit (HOSPITAL_COMMUNITY): Payer: Medicaid Other | Admitting: Occupational Therapy

## 2020-05-11 ENCOUNTER — Encounter (HOSPITAL_COMMUNITY): Payer: Self-pay | Admitting: Occupational Therapy

## 2020-05-11 ENCOUNTER — Encounter: Payer: Self-pay | Admitting: Orthopedic Surgery

## 2020-05-11 ENCOUNTER — Other Ambulatory Visit: Payer: Self-pay

## 2020-05-11 DIAGNOSIS — S46011A Strain of muscle(s) and tendon(s) of the rotator cuff of right shoulder, initial encounter: Secondary | ICD-10-CM

## 2020-05-11 DIAGNOSIS — M25511 Pain in right shoulder: Secondary | ICD-10-CM

## 2020-05-11 DIAGNOSIS — M25611 Stiffness of right shoulder, not elsewhere classified: Secondary | ICD-10-CM

## 2020-05-11 DIAGNOSIS — R29898 Other symptoms and signs involving the musculoskeletal system: Secondary | ICD-10-CM

## 2020-05-11 MED ORDER — OXYCODONE-ACETAMINOPHEN 10-325 MG PO TABS: ORAL_TABLET | ORAL | 0 refills | Status: DC

## 2020-05-11 MED ORDER — TIZANIDINE HCL 4 MG PO TABS: 2.0000 mg | ORAL_TABLET | Freq: Two times a day (BID) | ORAL | 0 refills | Status: DC | PRN

## 2020-05-11 MED ORDER — GABAPENTIN 300 MG PO CAPS: 300.0000 mg | ORAL_CAPSULE | Freq: Three times a day (TID) | ORAL | 0 refills | Status: DC

## 2020-05-11 NOTE — Progress Notes (Signed)
Post-Op Visit Note   Patient: Raymond Barrera.           Date of Birth: 09-04-1961           MRN: [id number] Visit Date: 05/11/2020 PCP: Lucia Gaskins, MD   Assessment & Plan:  Chief Complaint:  Chief Complaint  Patient presents with  . Right Shoulder - Routine Post Op   Visit Diagnoses:  1. Traumatic complete tear of right rotator cuff, initial encounter     Plan: Patient is a 59 year old male who presents s/p right shoulder arthroscopy with mini open rotator cuff repair on 04/12/2020.  Patient notes that he is in a moderate amount of pain and physical therapy makes his pain worse.  He has been to 2 sessions of physical therapy at [facility] outpatient physical therapy.  He notes muscle spasms in his bicep and throughout his arm.  He also notes right hand pain.  His right hand pain is associated with "crunching" with range of motion of his fingers.  On exam with the right shoulder he has 30 degrees of external rotation, 90 degrees of abduction, 90 degrees of forward flexion.  He abduction and forward flex passively with guarding as well as some spasming of the deltoid.  Rx submitted for narcotic medication, gabapentin, tizanidine.  Incisions are healing well.  Continue physical therapy.  Plan to start strengthening of the rotator cuff in 2 weeks.  Note provided to physical therapist.  Continue using CPM 3 times a day for 1 hour each time.  Follow-up in 4 weeks for clinical recheck.  Regarding the right hand, he does not want to take x-rays today due to scheduling.  He wants to have x-rays done of his right hand at his next appointment for further investigation.  Follow-Up Instructions: No follow-ups on file.   Orders:  No orders of the defined types were placed in this encounter.  Meds ordered this encounter  Medications  . oxyCODONE-acetaminophen (PERCOCET) 10-325 MG tablet    Sig: 1 po q 6-8hr prn pain    Dispense:  45 tablet    Refill:  0  . tiZANidine (ZANAFLEX) 4 MG  tablet    Sig: Take 0.5-1 tablets (2-4 mg total) by mouth 2 (two) times daily as needed for muscle spasms.    Dispense:  30 tablet    Refill:  0  . gabapentin (NEURONTIN) 300 MG capsule    Sig: Take 1 capsule (300 mg total) by mouth 3 (three) times daily.    Dispense:  45 capsule    Refill:  0    Imaging: No results found.  PMFS History: Patient Active Problem List   Diagnosis Date Noted  . Syncope 04/18/2019  . Chronic systolic heart failure ([place])   . Pain of both hip joints   . History of renal cell carcinoma   . Renal mass 03/30/2019  . Abdominal hematoma 08/13/2018  . Radiculopathy 06/16/2018  . Acute CVA (cerebrovascular accident) ([place]) 02/01/2017  . Diabetes mellitus ([place]) 02/01/2017  . HTN (hypertension) 02/01/2017  . Primary osteoarthritis of left hip 01/08/2016  . DJD (degenerative joint disease) 03/20/2015  . Primary osteoarthritis of right hip 02/26/2015  . Lumbar spondylosis 10/17/2014  . Lumbago 07/25/2014  . Abnormality of gait 07/25/2014  . Difficulty in walking(719.7) 07/25/2014  . TIA (transient ischemic attack) 03/09/2013  . Cocaine abuse ([place]) 03/09/2013  . Accelerated hypertension 03/09/2013  . Hyperlipidemia 03/09/2013  . Current smoker 03/09/2013   Past Medical History:  Diagnosis Date  . Ambulates with cane    straight - uses occasionally  . Anxiety    due to the stroke  . Arthritis   . Back pain    hx of buldging disc  . Complication of anesthesia    took a while for him to wake up after previous anesthesia  . Diabetes mellitus without complication ([place])   . Family history of adverse reaction to anesthesia    "sometimes mom has a hard time waking up"  . High cholesterol    takes Zocor daily  . Hypertension    takes Benazepril and HCTZ  daily  . Joint pain   . Joint swelling   . Memory impairment    occassional - from stroke  . Myocardial infarction ([place]) 1987  . Pneumonia    hx of-80's  . Shortness of breath dyspnea    do to  pain  . Sleep apnea    never had a sleep study,but states Dr. Cindie Laroche says he has it  . Slurred speech   . Stroke ([place]) 08/2013   7 mini-strokes, last stroke 2017  . TIA (transient ischemic attack) 2014   x 7   . Urinary frequency     Family History  Problem Relation Age of Onset  . Heart disease Father     Past Surgical History:  Procedure Laterality Date  . ABDOMINAL EXPOSURE N/A 06/16/2018   Procedure: ABDOMINAL EXPOSURE;  Surgeon: Rosetta Posner, MD;  Location: [place];  Service: Vascular;  Laterality: N/A;  . ANKLE SURGERY  2008   left ankle-otif-[facility]  . ANTERIOR LUMBAR FUSION Bilateral 06/16/2018   Procedure: LUMBAR 4-5 LUMBAR 5-SACRUM 1 ANTERIOR LUMBAR INTERBODY FUSION WITH INSTRUMENTATION AND ALLOGRAFT;  Surgeon: Phylliss Bob, MD;  Location: [place];  Service: Orthopedics;  Laterality: Bilateral;  . BACK SURGERY    . HEMATOMA EVACUATION Left 08/13/2018   Procedure: EVACUATION HEMATOMA LEFT ABDOMINAL WALL;  Surgeon: Rosetta Posner, MD;  [facility];  Service: Vascular;  Laterality: Left;  . IR RADIOLOGIST EVAL & MGMT  07/27/2018  . IR US GUIDE BX ASP/DRAIN  07/14/2018  . JOINT REPLACEMENT     both hips replaced   . LUMBAR LAMINECTOMY/DECOMPRESSION MICRODISCECTOMY Right 10/17/2014   Procedure: LUMBAR LAMINECTOMY/DECOMPRESSION MICRODISCECTOMY 2 LEVELS;  Surgeon: [name], MD;  Location: [place] [facility];  Service: Neurosurgery;  Laterality: Right;  Right L45 L5S1 laminectomy and foraminotomy  . MASS EXCISION  09/13/2012   Procedure: EXCISION MASS;  Surgeon: Jamesetta So, MD;  [facility];  Service: General;  Laterality: N/A;  . ROBOTIC ASSITED PARTIAL NEPHRECTOMY Left 03/30/2019   Procedure: XI ROBOTIC ASSITED PARTIAL NEPHRECTOMY POSSIBLE RADICAL NEPHRECTOMY;  Surgeon: Ceasar Mons, MD;  [facility];  Service: Urology;  Laterality: Left;  . SHOULDER ARTHROSCOPY WITH ROTATOR CUFF REPAIR Right 04/12/2020   Procedure: right shoulder arthroscopy,  debridement, mini open rotator cuff tear repair;  Surgeon: Meredith Pel, MD;  Location: [place];  Service: Orthopedics;  Laterality: Right;  . TONSILLECTOMY    . TOTAL HIP ARTHROPLASTY Right 03/20/2015  . TOTAL HIP ARTHROPLASTY Right 03/20/2015   Procedure: TOTAL HIP ARTHROPLASTY ANTERIOR APPROACH;  Surgeon: Renette Butters, MD;  Location: [place];  Service: Orthopedics;  Laterality: Right;  . TOTAL HIP ARTHROPLASTY Left 01/08/2016   Procedure: TOTAL HIP ARTHROPLASTY ANTERIOR APPROACH;  Surgeon: Renette Butters, MD;  Location: [place];  Service: Orthopedics;  Laterality: Left;   Social History   Occupational History  .  Not on file  Tobacco Use  . Smoking status: Current Some Day Smoker    Packs/day: 0.25    Years: 47.00    Pack years: 11.75    Types: Cigarettes  . Smokeless tobacco: Never Used  Vaping Use  . Vaping Use: Never used  Substance and Sexual Activity  . Alcohol use: Not Currently    Comment: quit 2012  . Drug use: Not Currently    Types: Cocaine    Comment: many yrs ago., last time- late 2016  . Sexual activity: Yes

## 2020-05-11 NOTE — Therapy (Signed)
[place], [place], [address] Phone: [phone]   Fax:  [phone]  Occupational Therapy Treatment  Patient Details  Name: Raymond Barrera. MRN: [id number] Date of Birth: Sep 13, 1961 Referring Provider (OT): Dr. Meredith Pel    Encounter Date: 05/11/2020   OT End of Session - 05/11/20 1517    Visit Number 3    Number of Visits 16    Date for OT Re-Evaluation 06/26/20   Mini reassessment 05/26/20   Authorization Type Medicaid    Authorization Time Period 3 visits approved 6/8-6/16    Authorization - Visit Number 2    Authorization - Number of Visits 3    OT Start Time 7342    OT Stop Time 1513    OT Time Calculation (min) 39 min    Activity Tolerance Patient tolerated treatment well    Behavior During Therapy [facility] for tasks assessed/performed           Past Medical History:  Diagnosis Date  . Ambulates with cane    straight - uses occasionally  . Anxiety    due to the stroke  . Arthritis   . Back pain    hx of buldging disc  . Complication of anesthesia    took a while for him to wake up after previous anesthesia  . Diabetes mellitus without complication ([place])   . Family history of adverse reaction to anesthesia    "sometimes mom has a hard time waking up"  . High cholesterol    takes Zocor daily  . Hypertension    takes Benazepril and HCTZ  daily  . Joint pain   . Joint swelling   . Memory impairment    occassional - from stroke  . Myocardial infarction ([place]) 1987  . Pneumonia    hx of-80's  . Shortness of breath dyspnea    do to pain  . Sleep apnea    never had a sleep study,but states Dr. Cindie Laroche says he has it  . Slurred speech   . Stroke ([place]) 08/2013   7 mini-strokes, last stroke 2017  . TIA (transient ischemic attack) 2014   x 7   . Urinary frequency     Past Surgical History:  Procedure Laterality Date  . ABDOMINAL EXPOSURE N/A 06/16/2018   Procedure: ABDOMINAL  EXPOSURE;  Surgeon: Rosetta Posner, MD;  Location: [place];  Service: Vascular;  Laterality: N/A;  . ANKLE SURGERY  2008   left ankle-otif-[facility]  . ANTERIOR LUMBAR FUSION Bilateral 06/16/2018   Procedure: LUMBAR 4-5 LUMBAR 5-SACRUM 1 ANTERIOR LUMBAR INTERBODY FUSION WITH INSTRUMENTATION AND ALLOGRAFT;  Surgeon: Phylliss Bob, MD;  Location: [place];  Service: Orthopedics;  Laterality: Bilateral;  . BACK SURGERY    . HEMATOMA EVACUATION Left 08/13/2018   Procedure: EVACUATION HEMATOMA LEFT ABDOMINAL WALL;  Surgeon: Rosetta Posner, MD;  [facility];  Service: Vascular;  Laterality: Left;  . IR RADIOLOGIST EVAL & MGMT  07/27/2018  . IR US GUIDE BX ASP/DRAIN  07/14/2018  . JOINT REPLACEMENT     both hips replaced   . LUMBAR LAMINECTOMY/DECOMPRESSION MICRODISCECTOMY Right 10/17/2014   Procedure: LUMBAR LAMINECTOMY/DECOMPRESSION MICRODISCECTOMY 2 LEVELS;  Surgeon: [name], MD;  Location: [place];  Service: Neurosurgery;  Laterality: Right;  Right L45 L5S1 laminectomy and foraminotomy  . MASS EXCISION  09/13/2012   Procedure: EXCISION MASS;  Surgeon: Jamesetta So, MD;  [facility];  Service: General;  Laterality:  N/A;  . ROBOTIC ASSITED PARTIAL NEPHRECTOMY Left 03/30/2019   Procedure: XI ROBOTIC ASSITED PARTIAL NEPHRECTOMY POSSIBLE RADICAL NEPHRECTOMY;  Surgeon: Ceasar Mons, MD;  [facility];  Service: Urology;  Laterality: Left;  . SHOULDER ARTHROSCOPY WITH ROTATOR CUFF REPAIR Right 04/12/2020   Procedure: right shoulder arthroscopy, debridement, mini open rotator cuff tear repair;  Surgeon: Meredith Pel, MD;  Location: [facility];  Service: Orthopedics;  Laterality: Right;  . TONSILLECTOMY    . TOTAL HIP ARTHROPLASTY Right 03/20/2015  . TOTAL HIP ARTHROPLASTY Right 03/20/2015   Procedure: TOTAL HIP ARTHROPLASTY ANTERIOR APPROACH;  Surgeon: Renette Butters, MD;  Location: [facility];  Service: Orthopedics;  Laterality: Right;  . TOTAL HIP ARTHROPLASTY Left 01/08/2016    Procedure: TOTAL HIP ARTHROPLASTY ANTERIOR APPROACH;  Surgeon: Renette Butters, MD;  Location: [facility];  Service: Orthopedics;  Laterality: Left;    There were no vitals filed for this visit.   Subjective Assessment - 05/11/20 1438    Subjective  S: I went to the doctor today, and he was pleased.    Pertinent History Pt is a 59 year old male, status post RCR on 04/12/2020. Presenting with wife for evaluation, no sling. Pt referred to occupational therapy for evaluation and treatment by Dr. Meredith Pel.    Currently in Pain? Yes    Pain Score 8     Pain Location Shoulder    Pain Orientation Right    Pain Descriptors / Indicators Aching;Sore;Sharp              [facility] OT Assessment - 05/11/20 1457      Precautions   Precautions Shoulder    Type of Shoulder Precautions Weeks 0-6 (5/13-6/24) PROM, A/AROM. Weeks 6+ (6/25) AROM and progress as tolerated .  "okay to start RTA strengthening on 05/28/2020 (~6 weeks out from procedure)."    Shoulder Interventions Shoulder sling/immobilizer;For comfort                    OT Treatments/Exercises (OP) - 05/11/20 1449      Exercises   Exercises Shoulder      Shoulder Exercises: Supine   Protraction PROM;10 reps    Horizontal ABduction PROM;10 reps    External Rotation PROM;10 reps    Internal Rotation PROM;10 reps    Flexion PROM;10 reps    ABduction PROM;10 reps      Shoulder Exercises: Seated   Elevation AROM;10 reps   hold 5 sec   Row AROM;10 reps   hold 5 sec   Other Seated Exercises scapular depression, A/ROM, 10X    Other Seated Exercises Shoulder roll backwards 10x. forward 10x      Shoulder Exercises: Pulleys   Flexion Other (comment)   10   Flexion Limitations Support at elbow and shoulder to decrease elbow flexion and shoulder hiking    ABduction Other (comment)   10x   ABduction Limitations Support at elbow and shoulder to decrease elbow flexion and shoulder hiking      Shoulder Exercises: Therapy Ball     Flexion 25 reps    ABduction 25 reps      Shoulder Exercises: Isometric Strengthening   Flexion Supine;3X5"    Extension Supine;3X5"    External Rotation Supine;3X5"    Internal Rotation Supine;3X5"    ABduction Supine;3X5"    ADduction Supine;3X5"      Manual Therapy   Manual Therapy Myofascial release    Manual therapy comments manual therapy completed seperate from  other treatment interventions    Myofascial Release myofascial release and manual techniques to right upper arm, trapezius, and scapular regions to decrease pain and fascial restrictions and increase joint ROM                    OT Short Term Goals - 05/08/20 1550      OT SHORT TERM GOAL #1   Title Patient will be educated and independent with HEP to increase functional use of right arm during daily tasks.     Time 4    Period Weeks    Status On-going    Target Date 05/27/20      OT SHORT TERM GOAL #2   Title Patient will increase P/ROM to WNL in order to increase ability to get shirts on and off with less difficulty.     Time 4    Period Weeks    Status On-going      OT SHORT TERM GOAL #3   Title Patient will increase RUE shoulder strength to 3+/5 overall in order to be able to return to reach items at waist to chest height.    Time 4    Period Weeks    Status On-going             OT Long Term Goals - 05/08/20 1550      OT LONG TERM GOAL #1   Title Patient will return to highest level of functioning while using his right hand as dominant for 75% or more tasks.     Time 8    Period Weeks    Status On-going      OT LONG TERM GOAL #2   Title Patient will increase A/ROM to [facility] to increase ability to reach overhead when standing or in bed with less difficulty.    Time 8    Period Weeks    Status On-going      OT LONG TERM GOAL #3   Title Patient will increase RUE strength to 5/5 in order to return to completing normal weighted household tasks.     Time 8    Period Weeks    Status  On-going      OT LONG TERM GOAL #4   Title Patient will report a decrease in pain level of approximately 3/10 or less to improve ability to sleep for 4 consecutive hours.    Time 8    Period Weeks    Status On-going      OT LONG TERM GOAL #5   Title Patient will decrease fascial restrictions to min amount or less in order to increase functional mobility needed to reach above shoulder level.    Time 8    Period Weeks    Status On-going                 Plan - 05/11/20 1520    Clinical Impression Statement A: Started with myofascial release and manual techniques with passive stretching. Continue with isometrics, scapular AROM, and therapy ball stretches. Initated pulley with tactile cues for positioning. Pt demonstrating increased AAROM and tolerance. Verbal cues for form and technique throughout. Pt having received MD script for "okay to start RTA strengthening on 05/28/2020 (~6 weeks out from procedure)."    Body Structure / Function / Physical Skills ADL;Strength;Dexterity;Pain;UE functional use;IADL;ROM;Fascial restriction;Mobility    OT Treatment/Interventions Self-care/ADL training;Moist Heat;Traction;Therapeutic activities;Therapeutic exercise;Scar mobilization;Cryotherapy;Passive range of motion;Manual Therapy;Patient/family education;Ultrasound;Electrical Stimulation    Plan P: Continue with passive strencth and isometrics.  Review pendulums and add them to HEP.    OT Home Exercise Plan 04/25/2020: table slides; 6/8: scapular A/ROM    Consulted and Agree with Plan of Care Patient;Family member/caregiver    Family Member Consulted Wife, Diane           Patient will benefit from skilled therapeutic intervention in order to improve the following deficits and impairments:   Body Structure / Function / Physical Skills: ADL, Strength, Dexterity, Pain, UE functional use, IADL, ROM, Fascial restriction, Mobility       Visit Diagnosis: Acute pain of right shoulder  Stiffness of  right shoulder, not elsewhere classified  Other symptoms and signs involving the musculoskeletal system    Problem List Patient Active Problem List   Diagnosis Date Noted  . Syncope 04/18/2019  . Chronic systolic heart failure ([place])   . Pain of both hip joints   . History of renal cell carcinoma   . Renal mass 03/30/2019  . Abdominal hematoma 08/13/2018  . Radiculopathy 06/16/2018  . Acute CVA (cerebrovascular accident) ([place]) 02/01/2017  . Diabetes mellitus ([place]) 02/01/2017  . HTN (hypertension) 02/01/2017  . Primary osteoarthritis of left hip 01/08/2016  . DJD (degenerative joint disease) 03/20/2015  . Primary osteoarthritis of right hip 02/26/2015  . Lumbar spondylosis 10/17/2014  . Lumbago 07/25/2014  . Abnormality of gait 07/25/2014  . Difficulty in walking(719.7) 07/25/2014  . TIA (transient ischemic attack) 03/09/2013  . Cocaine abuse ([place]) 03/09/2013  . Accelerated hypertension 03/09/2013  . Hyperlipidemia 03/09/2013  . Current smoker 03/09/2013    Heywood Footman Archie Atilano 05/11/2020, 4:06 PM  [facility] [address] [place], [place], [address] Phone: [phone]   Fax:  [phone]  Name: Raymond Barrera. MRN: [id number] Date of Birth: May 09, 1961

## 2020-05-15 ENCOUNTER — Telehealth (HOSPITAL_COMMUNITY): Payer: Self-pay | Admitting: Occupational Therapy

## 2020-05-15 ENCOUNTER — Ambulatory Visit (HOSPITAL_COMMUNITY): Payer: Medicaid Other | Admitting: Occupational Therapy

## 2020-05-15 NOTE — Telephone Encounter (Signed)
pt cancelled appt for today because he and his wife have a stomach virus

## 2020-05-18 ENCOUNTER — Ambulatory Visit (HOSPITAL_COMMUNITY): Payer: Medicaid Other | Admitting: Occupational Therapy

## 2020-05-18 ENCOUNTER — Other Ambulatory Visit: Payer: Self-pay

## 2020-05-18 DIAGNOSIS — R29898 Other symptoms and signs involving the musculoskeletal system: Secondary | ICD-10-CM

## 2020-05-18 DIAGNOSIS — M25511 Pain in right shoulder: Secondary | ICD-10-CM

## 2020-05-18 DIAGNOSIS — M25611 Stiffness of right shoulder, not elsewhere classified: Secondary | ICD-10-CM

## 2020-05-18 NOTE — Therapy (Addendum)
[place], [place], [address] Phone: [phone]   Fax:  [phone]  Occupational Therapy Treatment  Patient Details  Name: Raymond Barrera. MRN: [id number] Date of Birth: 1961/01/05 Referring Provider (OT): Dr. Meredith Pel    Encounter Date: 05/18/2020   OT End of Session - 05/18/20 1831    Visit Number 4    Number of Visits 16    Date for OT Re-Evaluation 06/26/20   Mini reassessment 05/26/20   Authorization Type Medicaid    Authorization Time Period 3 visits approved 6/8-6/16    Authorization - Visit Number 3   Authorization - Number of Visits 3    OT Start Time 1346    OT Stop Time 1429    OT Time Calculation (min) 43 min    Activity Tolerance Patient tolerated treatment well    Behavior During Therapy [facility] for tasks assessed/performed           Past Medical History:  Diagnosis Date  . Ambulates with cane    straight - uses occasionally  . Anxiety    due to the stroke  . Arthritis   . Back pain    hx of buldging disc  . Complication of anesthesia    took a while for him to wake up after previous anesthesia  . Diabetes mellitus without complication ([place])   . Family history of adverse reaction to anesthesia    "sometimes mom has a hard time waking up"  . High cholesterol    takes Zocor daily  . Hypertension    takes Benazepril and HCTZ  daily  . Joint pain   . Joint swelling   . Memory impairment    occassional - from stroke  . Myocardial infarction ([place]) 1987  . Pneumonia    hx of-80's  . Shortness of breath dyspnea    do to pain  . Sleep apnea    never had a sleep study,but states Dr. Cindie Laroche says he has it  . Slurred speech   . Stroke ([place]) 08/2013   7 mini-strokes, last stroke 2017  . TIA (transient ischemic attack) 2014   x 7   . Urinary frequency     Past Surgical History:  Procedure Laterality Date  . ABDOMINAL EXPOSURE N/A 06/16/2018   Procedure: ABDOMINAL  EXPOSURE;  Surgeon: Rosetta Posner, MD;  Location: [place];  Service: Vascular;  Laterality: N/A;  . ANKLE SURGERY  2008   left ankle-otif-[facility]  . ANTERIOR LUMBAR FUSION Bilateral 06/16/2018   Procedure: LUMBAR 4-5 LUMBAR 5-SACRUM 1 ANTERIOR LUMBAR INTERBODY FUSION WITH INSTRUMENTATION AND ALLOGRAFT;  Surgeon: Phylliss Bob, MD;  Location: [facility];  Service: Orthopedics;  Laterality: Bilateral;  . BACK SURGERY    . HEMATOMA EVACUATION Left 08/13/2018   Procedure: EVACUATION HEMATOMA LEFT ABDOMINAL WALL;  Surgeon: Rosetta Posner, MD;  [facility];  Service: Vascular;  Laterality: Left;  . IR RADIOLOGIST EVAL & MGMT  07/27/2018  . IR US GUIDE BX ASP/DRAIN  07/14/2018  . JOINT REPLACEMENT     both hips replaced   . LUMBAR LAMINECTOMY/DECOMPRESSION MICRODISCECTOMY Right 10/17/2014   Procedure: LUMBAR LAMINECTOMY/DECOMPRESSION MICRODISCECTOMY 2 LEVELS;  Surgeon: [name], MD;  Location: [place] [facility];  Service: Neurosurgery;  Laterality: Right;  Right L45 L5S1 laminectomy and foraminotomy  . MASS EXCISION  09/13/2012   Procedure: EXCISION MASS;  Surgeon: Jamesetta So, MD;  [facility];  Service: General;  Laterality: N/A;  .  ROBOTIC ASSITED PARTIAL NEPHRECTOMY Left 03/30/2019   Procedure: XI ROBOTIC ASSITED PARTIAL NEPHRECTOMY POSSIBLE RADICAL NEPHRECTOMY;  Surgeon: Ceasar Mons, MD;  [facility];  Service: Urology;  Laterality: Left;  . SHOULDER ARTHROSCOPY WITH ROTATOR CUFF REPAIR Right 04/12/2020   Procedure: right shoulder arthroscopy, debridement, mini open rotator cuff tear repair;  Surgeon: Meredith Pel, MD;  Location: [facility];  Service: Orthopedics;  Laterality: Right;  . TONSILLECTOMY    . TOTAL HIP ARTHROPLASTY Right 03/20/2015  . TOTAL HIP ARTHROPLASTY Right 03/20/2015   Procedure: TOTAL HIP ARTHROPLASTY ANTERIOR APPROACH;  Surgeon: Renette Butters, MD;  Location: [facility];  Service: Orthopedics;  Laterality: Right;  . TOTAL HIP ARTHROPLASTY Left 01/08/2016    Procedure: TOTAL HIP ARTHROPLASTY ANTERIOR APPROACH;  Surgeon: Renette Butters, MD;  Location: [facility];  Service: Orthopedics;  Laterality: Left;    There were no vitals filed for this visit.   Subjective Assessment - 05/18/20 1349    Subjective  S: "I didn't take my medicine." (in reference to pain medicine)    Currently in Pain? Yes    Pain Score 8     Pain Location Shoulder    Pain Orientation Right                        OT Treatments/Exercises (OP) - 05/18/20 1350      Exercises   Exercises Shoulder      Shoulder Exercises: Supine   External Rotation PROM;10 reps    Internal Rotation PROM;10 reps    Flexion PROM;10 reps    ABduction PROM;10 reps      Shoulder Exercises: Seated   Row AAROM;10 reps   PVC   External Rotation AAROM;10 reps   PVC   Internal Rotation AAROM;10 reps   PVC   Flexion AAROM;12 reps   PVC   Abduction AAROM;10 reps   PVC     Shoulder Exercises: Pulleys   Flexion Other (comment)   10   ABduction Other (comment)   10     Shoulder Exercises: Therapy Ball   Flexion Other (comment)   2 min   ABduction Other (comment)   2 min     Manual Therapy   Manual Therapy Myofascial release    Manual therapy comments manual therapy completed seperate from other treatment interventions    Myofascial Release myofascial release and manual techniques to right upper arm, trapezius, and scapular regions to decrease pain and fascial restrictions and increase joint ROM                  OT Education - 05/18/20 1830    Education Details Provided AAROM exercises with dowel. Reviewed and pt verbalized understanding    Person(s) Educated Patient;Spouse    Methods Explanation;Demonstration;Handout;Verbal cues    Comprehension Verbalized understanding;Returned demonstration            OT Short Term Goals - 05/08/20 1550      OT SHORT TERM GOAL #1   Title Patient will be educated and independent with HEP to increase functional use of right arm  during daily tasks.     Time 4    Period Weeks    Status On-going    Target Date 05/27/20      OT SHORT TERM GOAL #2   Title Patient will increase P/ROM to WNL in order to increase ability to get shirts on and off with less difficulty.     Time 4  Period Weeks    Status On-going      OT SHORT TERM GOAL #3   Title Patient will increase RUE shoulder strength to 3+/5 overall in order to be able to return to reach items at waist to chest height.    Time 4    Period Weeks    Status On-going             OT Long Term Goals - 05/08/20 1550      OT LONG TERM GOAL #1   Title Patient will return to highest level of functioning while using his right hand as dominant for 75% or more tasks.     Time 8    Period Weeks    Status On-going      OT LONG TERM GOAL #2   Title Patient will increase A/ROM to [facility] to increase ability to reach overhead when standing or in bed with less difficulty.    Time 8    Period Weeks    Status On-going      OT LONG TERM GOAL #3   Title Patient will increase RUE strength to 5/5 in order to return to completing normal weighted household tasks.     Time 8    Period Weeks    Status On-going      OT LONG TERM GOAL #4   Title Patient will report a decrease in pain level of approximately 3/10 or less to improve ability to sleep for 4 consecutive hours.    Time 8    Period Weeks    Status On-going      OT LONG TERM GOAL #5   Title Patient will decrease fascial restrictions to min amount or less in order to increase functional mobility needed to reach above shoulder level.    Time 8    Period Weeks    Status On-going                 Plan - 05/18/20 1831    Clinical Impression Statement A: Started with myofascial release and manual techniques with passive stretching. Noting pt with increased PROM this session. Continue with isometrics, scapular AROM, pulleys, and therapy ball stretches. Initated AAROM exercises with dowel with tactile cues for  positioning. Pt demonstrating increased AAROM and tolerance. Verbal cues for form and technique throughout.    Body Structure / Function / Physical Skills ADL;Strength;Dexterity;Pain;UE functional use;IADL;ROM;Fascial restriction;Mobility    OT Treatment/Interventions Self-care/ADL training;Moist Heat;Traction;Therapeutic activities;Therapeutic exercise;Scar mobilization;Cryotherapy;Passive range of motion;Manual Therapy;Patient/family education;Ultrasound;Electrical Stimulation    Plan P: Continue with passive strencth and isometrics. Review pendulums and add them to HEP.    OT Home Exercise Plan 04/25/2020: table slides; 6/8: scapular A/ROM. 6/18 AAROM    Consulted and Agree with Plan of Care Patient;Family member/caregiver    Family Member Consulted Wife, Diane           Patient will benefit from skilled therapeutic intervention in order to improve the following deficits and impairments:   Body Structure / Function / Physical Skills: ADL, Strength, Dexterity, Pain, UE functional use, IADL, ROM, Fascial restriction, Mobility       Visit Diagnosis: Acute pain of right shoulder  Stiffness of right shoulder, not elsewhere classified  Other symptoms and signs involving the musculoskeletal system    Problem List Patient Active Problem List   Diagnosis Date Noted  . Syncope 04/18/2019  . Chronic systolic heart failure ([place])   . Pain of both hip joints   . History of renal cell  carcinoma   . Renal mass 03/30/2019  . Abdominal hematoma 08/13/2018  . Radiculopathy 06/16/2018  . Acute CVA (cerebrovascular accident) ([place]) 02/01/2017  . Diabetes mellitus ([place]) 02/01/2017  . HTN (hypertension) 02/01/2017  . Primary osteoarthritis of left hip 01/08/2016  . DJD (degenerative joint disease) 03/20/2015  . Primary osteoarthritis of right hip 02/26/2015  . Lumbar spondylosis 10/17/2014  . Lumbago 07/25/2014  . Abnormality of gait 07/25/2014  . Difficulty in walking(719.7) 07/25/2014  .  TIA (transient ischemic attack) 03/09/2013  . Cocaine abuse ([place]) 03/09/2013  . Accelerated hypertension 03/09/2013  . Hyperlipidemia 03/09/2013  . Current smoker 03/09/2013    Neal Dy, MSOT, OTR/L 05/18/2020, 6:36 PM  [facility] [address] [place], [place], [address] Phone: [phone]   Fax:  [phone]  Name: Raymond Barrera. MRN: [id number] Date of Birth: 06-09-1961

## 2020-05-18 NOTE — Patient Instructions (Signed)

## 2020-05-22 ENCOUNTER — Ambulatory Visit (HOSPITAL_COMMUNITY): Payer: Medicaid Other | Admitting: Occupational Therapy

## 2020-05-23 ENCOUNTER — Telehealth: Payer: Self-pay | Admitting: Orthopedic Surgery

## 2020-05-23 NOTE — Telephone Encounter (Signed)
Patient called.   The patient's wife called. Requesting a refill on the oxycodone and a call once it has been done.   Call back: [phone]

## 2020-05-23 NOTE — Telephone Encounter (Signed)
Dr Marlou Sa can you please advise?

## 2020-05-24 ENCOUNTER — Ambulatory Visit (HOSPITAL_COMMUNITY): Payer: Medicaid Other | Admitting: Occupational Therapy

## 2020-05-24 ENCOUNTER — Encounter (HOSPITAL_COMMUNITY): Payer: Self-pay | Admitting: Occupational Therapy

## 2020-05-24 ENCOUNTER — Other Ambulatory Visit: Payer: Self-pay

## 2020-05-24 ENCOUNTER — Telehealth: Payer: Self-pay | Admitting: Orthopedic Surgery

## 2020-05-24 DIAGNOSIS — M25611 Stiffness of right shoulder, not elsewhere classified: Secondary | ICD-10-CM

## 2020-05-24 DIAGNOSIS — M25511 Pain in right shoulder: Secondary | ICD-10-CM

## 2020-05-24 DIAGNOSIS — R29898 Other symptoms and signs involving the musculoskeletal system: Secondary | ICD-10-CM

## 2020-05-24 NOTE — Telephone Encounter (Signed)
Patient's wife called requesting a update on refills of oxycodone and simvastation. Please send to pharmacy on file. Patient phone number is [phone]

## 2020-05-24 NOTE — Telephone Encounter (Signed)
We are now in taper mode.  Okay for Percocet 04/02/2024 1 p.o. every 8 hours as needed pain #45.

## 2020-05-24 NOTE — Therapy (Signed)
Raymond Barrera, [place], [address] Phone: [phone]   Fax:  [phone]  Occupational Therapy Treatment  Patient Details  Name: Raymond Barrera. MRN: [id number] Date of Birth: 10-Oct-1961 Referring Provider (OT): Dr. Meredith Pel    Encounter Date: 05/24/2020   OT End of Session - 05/24/20 1519    Visit Number 5    Number of Visits 16    Date for OT Re-Evaluation 06/26/20    Authorization Type Medicaid    Authorization - Visit Number 2    Authorization - Number of Visits 3    OT Start Time 2355    OT Stop Time 1511    OT Time Calculation (min) 38 min    Activity Tolerance Patient tolerated treatment well    Behavior During Therapy [facility] for tasks assessed/performed           Past Medical History:  Diagnosis Date  . Ambulates with cane    straight - uses occasionally  . Anxiety    due to the stroke  . Arthritis   . Back pain    hx of buldging disc  . Complication of anesthesia    took a while for him to wake up after previous anesthesia  . Diabetes mellitus without complication ([place])   . Family history of adverse reaction to anesthesia    "sometimes mom has a hard time waking up"  . High cholesterol    takes Zocor daily  . Hypertension    takes Benazepril and HCTZ  daily  . Joint pain   . Joint swelling   . Memory impairment    occassional - from stroke  . Myocardial infarction ([place]) 1987  . Pneumonia    hx of-80's  . Shortness of breath dyspnea    do to pain  . Sleep apnea    never had a sleep study,but states Dr. Cindie Barrera says he has it  . Slurred speech   . Stroke ([place]) 08/2013   7 mini-strokes, last stroke 2017  . TIA (transient ischemic attack) 2014   x 7   . Urinary frequency     Past Surgical History:  Procedure Laterality Date  . ABDOMINAL EXPOSURE N/A 06/16/2018   Procedure: ABDOMINAL EXPOSURE;  Surgeon: [place] Posner, MD;  Location: [place];  Service: Vascular;  Laterality:  N/A;  . ANKLE SURGERY  2008   left ankle-otif-[facility]  . ANTERIOR LUMBAR FUSION Bilateral 06/16/2018   Procedure: LUMBAR 4-5 LUMBAR 5-SACRUM 1 ANTERIOR LUMBAR INTERBODY FUSION WITH INSTRUMENTATION AND ALLOGRAFT;  Surgeon: [place] Bob, MD;  Location: [place];  Service: Orthopedics;  Laterality: Bilateral;  . BACK SURGERY    . HEMATOMA EVACUATION Left 08/13/2018   Procedure: EVACUATION HEMATOMA LEFT ABDOMINAL WALL;  Surgeon: [place] Posner, MD;  [facility];  Service: Vascular;  Laterality: Left;  . IR RADIOLOGIST EVAL & MGMT  07/27/2018  . IR US GUIDE BX ASP/DRAIN  07/14/2018  . JOINT REPLACEMENT     both hips replaced   . LUMBAR LAMINECTOMY/DECOMPRESSION MICRODISCECTOMY Right 10/17/2014   Procedure: LUMBAR LAMINECTOMY/DECOMPRESSION MICRODISCECTOMY 2 LEVELS;  Surgeon: [place] Lose, MD;  Location: [place] [facility];  Service: Neurosurgery;  Laterality: Right;  Right L45 L5S1 laminectomy and foraminotomy  . MASS EXCISION  09/13/2012   Procedure: EXCISION MASS;  Surgeon: [place] So, MD;  [facility];  Service: General;  Laterality: N/A;  . ROBOTIC ASSITED PARTIAL NEPHRECTOMY Left 03/30/2019   Procedure: XI ROBOTIC  ASSITED PARTIAL NEPHRECTOMY POSSIBLE RADICAL NEPHRECTOMY;  Surgeon: [place] Mons, MD;  [facility];  Service: Urology;  Laterality: Left;  . SHOULDER ARTHROSCOPY WITH ROTATOR CUFF REPAIR Right 04/12/2020   Procedure: right shoulder arthroscopy, debridement, mini open rotator cuff tear repair;  Surgeon: Meredith Pel, MD;  Location: [place];  Service: Orthopedics;  Laterality: Right;  . TONSILLECTOMY    . TOTAL HIP ARTHROPLASTY Right 03/20/2015  . TOTAL HIP ARTHROPLASTY Right 03/20/2015   Procedure: TOTAL HIP ARTHROPLASTY ANTERIOR APPROACH;  Surgeon: [place] Butters, MD;  Location: [place];  Service: Orthopedics;  Laterality: Right;  . TOTAL HIP ARTHROPLASTY Left 01/08/2016   Procedure: TOTAL HIP ARTHROPLASTY ANTERIOR APPROACH;  Surgeon: [place] Butters, MD;   Location: [place];  Service: Orthopedics;  Laterality: Left;    There were no vitals filed for this visit.   Subjective Assessment - 05/24/20 1434    Subjective  S: "I ran out of medicine." (in reference to pain medicine)    Pertinent History Pt is a 59 year old male, status post RCR on 04/12/2020. Presenting with wife for evaluation, no sling. Pt referred to occupational therapy for evaluation and treatment by Dr. Meredith Pel.    Currently in Pain? Yes    Pain Score 9     Pain Location Shoulder    Pain Orientation Right    Pain Descriptors / Indicators Aching    Pain Type Acute pain    Pain Frequency Constant                        OT Treatments/Exercises (OP) - 05/24/20 1436      Exercises   Exercises Shoulder      Shoulder Exercises: Supine   External Rotation PROM;10 reps    Internal Rotation PROM;10 reps    Flexion PROM;10 reps    ABduction PROM;10 reps      Shoulder Exercises: Seated   Elevation AROM;10 reps    Row AAROM;10 reps   dowel rod   External Rotation AAROM;10 reps   dowel rod   Internal Rotation AAROM;10 reps   dowel rod   Flexion AAROM;10 reps   with dowel    Abduction AAROM;10 reps      Shoulder Exercises: Therapy Ball   Flexion 10 reps    ABduction 10 reps      Shoulder Exercises: ROM/Strengthening   Thumb Tacks 10 reps   low level   "W" Arms 10 reps pt completed with  increased effort and therapist support to elbow for increased ROM on R UE       Modalities   Modalities Moist Heat      Moist Heat Therapy   Number Minutes Moist Heat 3 Minutes    Moist Heat Location Shoulder      Manual Therapy   Manual Therapy Myofascial release    Manual therapy comments manual therapy completed seperate from other treatment interventions    Myofascial Release myofascial release and manual techniques to right upper arm, trapezius, and scapular regions to decrease pain and fascial restrictions and increase joint ROM                     OT Short Term Goals - 05/08/20 1550      OT SHORT TERM GOAL #1   Title Patient will be educated and independent with HEP to increase functional use of right arm during daily tasks.     Time 4  Period Weeks    Status On-going    Target Date 05/27/20      OT SHORT TERM GOAL #2   Title Patient will increase P/ROM to WNL in order to increase ability to get shirts on and off with less difficulty.     Time 4    Period Weeks    Status On-going      OT SHORT TERM GOAL #3   Title Patient will increase RUE shoulder strength to 3+/5 overall in order to be able to return to reach items at waist to chest height.    Time 4    Period Weeks    Status On-going             OT Long Term Goals - 05/08/20 1550      OT LONG TERM GOAL #1   Title Patient will return to highest level of functioning while using his right hand as dominant for 75% or more tasks.     Time 8    Period Weeks    Status On-going      OT LONG TERM GOAL #2   Title Patient will increase A/ROM to [facility] to increase ability to reach overhead when standing or in bed with less difficulty.    Time 8    Period Weeks    Status On-going      OT LONG TERM GOAL #3   Title Patient will increase RUE strength to 5/5 in order to return to completing normal weighted household tasks.     Time 8    Period Weeks    Status On-going      OT LONG TERM GOAL #4   Title Patient will report a decrease in pain level of approximately 3/10 or less to improve ability to sleep for 4 consecutive hours.    Time 8    Period Weeks    Status On-going      OT LONG TERM GOAL #5   Title Patient will decrease fascial restrictions to min amount or less in order to increase functional mobility needed to reach above shoulder level.    Time 8    Period Weeks    Status On-going                 Plan - 05/24/20 1536    Clinical Impression Statement A: Started with myofascial release and manual techniques with passive  stretching. Noting pt with increased PROM this session. Continue with scapular AROM, AAROM shoulder ROM with dowel, and therapy ball stretches. Initated AROM with thumb tacks and wall W's. Pt need assist with ROM with W's. Therapist assisted with flexion by supporting under R elbow for wall W's.  Pt demonstrating increased AAROM. Verbal cues for form and technique throughout.    Body Structure / Function / Physical Skills ADL;Strength;Dexterity;Pain;UE functional use;IADL;ROM;Fascial restriction;Mobility    Plan Continue AAROM and progressing to AROM.    OT Home Exercise Plan 04/25/2020: table slides; 6/8: scapular A/ROM. 6/18 AAROM    Consulted and Agree with Plan of Care Patient;Family member/caregiver    Family Member Consulted Wife, Diane           Patient will benefit from skilled therapeutic intervention in order to improve the following deficits and impairments:   Body Structure / Function / Physical Skills: ADL, Strength, Dexterity, Pain, UE functional use, IADL, ROM, Fascial restriction, Mobility       Visit Diagnosis: Acute pain of right shoulder  Stiffness of right shoulder, not elsewhere classified  Other symptoms and signs involving the musculoskeletal system    Problem List Patient Active Problem List   Diagnosis Date Noted  . Syncope 04/18/2019  . Chronic systolic heart failure ([place])   . Pain of both hip joints   . History of renal cell carcinoma   . Renal mass 03/30/2019  . Abdominal hematoma 08/13/2018  . Radiculopathy 06/16/2018  . Acute CVA (cerebrovascular accident) ([place]) 02/01/2017  . Diabetes mellitus ([place]) 02/01/2017  . HTN (hypertension) 02/01/2017  . Primary osteoarthritis of left hip 01/08/2016  . DJD (degenerative joint disease) 03/20/2015  . Primary osteoarthritis of right hip 02/26/2015  . Lumbar spondylosis 10/17/2014  . Lumbago 07/25/2014  . Abnormality of gait 07/25/2014  . Difficulty in walking(719.7) 07/25/2014  . TIA (transient ischemic  attack) 03/09/2013  . Cocaine abuse ([place]) 03/09/2013  . Accelerated hypertension 03/09/2013  . Hyperlipidemia 03/09/2013  . Current smoker 03/09/2013    Neal Dy, MSOT, OTR/L 05/24/2020, 6:39 PM  [place] [address] [place], [place], [address] Phone: [phone]   Fax:  [phone]  Name: Rito Lecomte. MRN: [id number] Date of Birth: 07-29-1961

## 2020-05-24 NOTE — Telephone Encounter (Signed)
See below

## 2020-05-25 ENCOUNTER — Telehealth: Payer: Self-pay | Admitting: Orthopedic Surgery

## 2020-05-25 ENCOUNTER — Other Ambulatory Visit: Payer: Self-pay | Admitting: Orthopedic Surgery

## 2020-05-25 MED ORDER — OXYCODONE-ACETAMINOPHEN 5-325 MG PO TABS: 1.0000 | ORAL_TABLET | Freq: Three times a day (TID) | ORAL | 0 refills | Status: DC | PRN

## 2020-05-25 NOTE — Telephone Encounter (Signed)
See below Can you please get this submitted ASAP? Patient is out and afraid of being without meds over the weekend.

## 2020-05-25 NOTE — Telephone Encounter (Signed)
Tried calling to advise. No answer. No VM to LM.

## 2020-05-25 NOTE — Telephone Encounter (Signed)
I left voicemail for patient's wife advising medication has been sent to the pharmacy.

## 2020-05-25 NOTE — Telephone Encounter (Signed)
Patient's wife called about update about refills of medication. Please call patient's wife [name] about this matter. Patient phone number is [phone].

## 2020-05-25 NOTE — Telephone Encounter (Signed)
Done thx

## 2020-05-25 NOTE — Telephone Encounter (Signed)
I left voicemail for patient's wife advising.

## 2020-05-25 NOTE — Telephone Encounter (Signed)
Patient's wife called. Would like for Lauren to call her. [phone]. Concerning the RX.

## 2020-05-29 ENCOUNTER — Encounter (HOSPITAL_COMMUNITY): Payer: Self-pay | Admitting: Occupational Therapy

## 2020-05-29 ENCOUNTER — Ambulatory Visit (HOSPITAL_COMMUNITY): Payer: Medicaid Other | Admitting: Occupational Therapy

## 2020-05-29 ENCOUNTER — Other Ambulatory Visit: Payer: Self-pay

## 2020-05-29 DIAGNOSIS — R29898 Other symptoms and signs involving the musculoskeletal system: Secondary | ICD-10-CM

## 2020-05-29 DIAGNOSIS — M25511 Pain in right shoulder: Secondary | ICD-10-CM

## 2020-05-29 DIAGNOSIS — M25611 Stiffness of right shoulder, not elsewhere classified: Secondary | ICD-10-CM

## 2020-05-29 NOTE — Therapy (Signed)
[place], [place], [address] Phone: [phone]   Fax:  [phone]  Occupational Therapy Treatment  Patient Details  Name: Raymond Barrera. MRN: [id number] Date of Birth: 04-Aug-1961 Referring Provider (OT): Dr. Meredith Pel    Encounter Date: 05/29/2020   OT End of Session - 05/29/20 1736    Visit Number 6    Number of Visits 16    Date for OT Re-Evaluation 06/26/20    Authorization Type Medicaid    Authorization Time Period Requesting 12 additional visits    Authorization - Visit Number 0    Authorization - Number of Visits 12    OT Start Time 5465    OT Stop Time 1730    OT Time Calculation (min) 26 min    Activity Tolerance Patient tolerated treatment well    Behavior During Therapy [facility] for tasks assessed/performed           Past Medical History:  Diagnosis Date  . Ambulates with cane    straight - uses occasionally  . Anxiety    due to the stroke  . Arthritis   . Back pain    hx of buldging disc  . Complication of anesthesia    took a while for him to wake up after previous anesthesia  . Diabetes mellitus without complication ([place])   . Family history of adverse reaction to anesthesia    "sometimes mom has a hard time waking up"  . High cholesterol    takes Zocor daily  . Hypertension    takes Benazepril and HCTZ  daily  . Joint pain   . Joint swelling   . Memory impairment    occassional - from stroke  . Myocardial infarction ([place]) 1987  . Pneumonia    hx of-80's  . Shortness of breath dyspnea    do to pain  . Sleep apnea    never had a sleep study,but states Dr. Cindie Laroche says he has it  . Slurred speech   . Stroke ([place]) 08/2013   7 mini-strokes, last stroke 2017  . TIA (transient ischemic attack) 2014   x 7   . Urinary frequency     Past Surgical History:  Procedure Laterality Date  . ABDOMINAL EXPOSURE N/A 06/16/2018   Procedure: ABDOMINAL EXPOSURE;  Surgeon: Rosetta Posner, MD;  Location: [place];  Service: Vascular;  Laterality: N/A;  . ANKLE SURGERY  2008   left ankle-otif-[facility]  . ANTERIOR LUMBAR FUSION Bilateral 06/16/2018   Procedure: LUMBAR 4-5 LUMBAR 5-SACRUM 1 ANTERIOR LUMBAR INTERBODY FUSION WITH INSTRUMENTATION AND ALLOGRAFT;  Surgeon: Phylliss Bob, MD;  Location: [place];  Service: Orthopedics;  Laterality: Bilateral;  . BACK SURGERY    . HEMATOMA EVACUATION Left 08/13/2018   Procedure: EVACUATION HEMATOMA LEFT ABDOMINAL WALL;  Surgeon: Rosetta Posner, MD;  [facility];  Service: Vascular;  Laterality: Left;  . IR RADIOLOGIST EVAL & MGMT  07/27/2018  . IR US GUIDE BX ASP/DRAIN  07/14/2018  . JOINT REPLACEMENT     both hips replaced   . LUMBAR LAMINECTOMY/DECOMPRESSION MICRODISCECTOMY Right 10/17/2014   Procedure: LUMBAR LAMINECTOMY/DECOMPRESSION MICRODISCECTOMY 2 LEVELS;  Surgeon: [name], MD;  Location: [place] NEUR[facility] ORS;  Service: Neurosurgery;  Laterality: Right;  Right L45 L5S1 laminectomy and foraminotomy  . MASS EXCISION  09/13/2012   Procedure: EXCISION MASS;  Surgeon: Jamesetta So, MD;  [facility];  Service: General;  Laterality: N/A;  . ROBOTIC  ASSITED PARTIAL NEPHRECTOMY Left 03/30/2019   Procedure: XI ROBOTIC ASSITED PARTIAL NEPHRECTOMY POSSIBLE RADICAL NEPHRECTOMY;  Surgeon: Ceasar Mons, MD;  [facility];  Service: Urology;  Laterality: Left;  . SHOULDER ARTHROSCOPY WITH ROTATOR CUFF REPAIR Right 04/12/2020   Procedure: right shoulder arthroscopy, debridement, mini open rotator cuff tear repair;  Surgeon: Meredith Pel, MD;  Location: [place];  Service: Orthopedics;  Laterality: Right;  . TONSILLECTOMY    . TOTAL HIP ARTHROPLASTY Right 03/20/2015  . TOTAL HIP ARTHROPLASTY Right 03/20/2015   Procedure: TOTAL HIP ARTHROPLASTY ANTERIOR APPROACH;  Surgeon: [place] Butters, MD;  Location: [place];  Service: Orthopedics;  Laterality: Right;  . TOTAL HIP ARTHROPLASTY Left 01/08/2016   Procedure: TOTAL HIP  ARTHROPLASTY ANTERIOR APPROACH;  Surgeon: Renette Butters, MD;  Location: [place];  Service: Orthopedics;  Laterality: Left;    There were no vitals filed for this visit.   Subjective Assessment - 05/29/20 1707    Subjective  S: It hurts me at night sometimes.    Currently in Pain? Yes    Pain Score 8     Pain Location Shoulder    Pain Orientation Right    Pain Descriptors / Indicators Aching;Sore    Pain Type Acute pain    Pain Radiating Towards N/A    Pain Onset Yesterday    Pain Frequency Intermittent    Aggravating Factors  movement    Pain Relieving Factors rest, pain medication    Effect of Pain on Daily Activities mod effect on ADLs    Multiple Pain Sites No              [facility] OT Assessment - 05/29/20 1706      Assessment   Medical Diagnosis Status post RCR      Precautions   Precautions Shoulder    Type of Shoulder Precautions progress as tolerated                    OT Treatments/Exercises (OP) - 05/29/20 1708      Exercises   Exercises Shoulder      Shoulder Exercises: Supine   Protraction PROM;10 reps    Horizontal ABduction PROM;10 reps    External Rotation PROM;10 reps    Internal Rotation PROM;10 reps    Flexion PROM;10 reps    ABduction PROM;10 reps      Shoulder Exercises: Seated   Horizontal ABduction AAROM;10 reps    External Rotation AAROM;10 reps    Internal Rotation AAROM;10 reps    Flexion AAROM;10 reps    Abduction AAROM;10 reps      Shoulder Exercises: ROM/Strengthening   Wall Wash 1'                    OT Short Term Goals - 05/08/20 1550      OT SHORT TERM GOAL #1   Title Patient will be educated and independent with HEP to increase functional use of right arm during daily tasks.     Time 4    Period Weeks    Status On-going    Target Date 05/27/20      OT SHORT TERM GOAL #2   Title Patient will increase P/ROM to WNL in order to increase ability to get shirts on and off with less difficulty.     Time  4    Period Weeks    Status On-going      OT SHORT TERM GOAL #3   Title  Patient will increase RUE shoulder strength to 3+/5 overall in order to be able to return to reach items at waist to chest height.    Time 4    Period Weeks    Status On-going             OT Long Term Goals - 05/08/20 1550      OT LONG TERM GOAL #1   Title Patient will return to highest level of functioning while using his right hand as dominant for 75% or more tasks.     Time 8    Period Weeks    Status On-going      OT LONG TERM GOAL #2   Title Patient will increase A/ROM to [facility] to increase ability to reach overhead when standing or in bed with less difficulty.    Time 8    Period Weeks    Status On-going      OT LONG TERM GOAL #3   Title Patient will increase RUE strength to 5/5 in order to return to completing normal weighted household tasks.     Time 8    Period Weeks    Status On-going      OT LONG TERM GOAL #4   Title Patient will report a decrease in pain level of approximately 3/10 or less to improve ability to sleep for 4 consecutive hours.    Time 8    Period Weeks    Status On-going      OT LONG TERM GOAL #5   Title Patient will decrease fascial restrictions to min amount or less in order to increase functional mobility needed to reach above shoulder level.    Time 8    Period Weeks    Status On-going                 Plan - 05/29/20 1725    Clinical Impression Statement A: Pt arrived late to session, no manual techniques completed today. Pt resistant to passive stretching, demonstrates more ROM during AA/ROM tasks. OT notes bulging bicep this date, "popeye" muscle, indicative of bicep injury. Pt unable to tell OT when muscle bulge appeared, does not have pain specific to bicep or bicep tendon until fatigues with exercise. OT will let MD know of observation as pt did have bicep tenodesis along with his RCR. Pt completing AA/ROM and wall wash this session. No manual therapy  completed as pt was late. Verbal cuing for form and technique.    Body Structure / Function / Physical Skills ADL;Strength;Dexterity;Pain;UE functional use;IADL;ROM;Fascial restriction;Mobility    Plan P: discontinue passive stretching due to pt guarding, complete AA/ROM in supine and sitting    OT Home Exercise Plan 04/25/2020: table slides; 6/8: scapular A/ROM. 6/18 AAROM    Consulted and Agree with Plan of Care Patient;Family member/caregiver    Family Member Consulted Wife, Diane           Patient will benefit from skilled therapeutic intervention in order to improve the following deficits and impairments:   Body Structure / Function / Physical Skills: ADL, Strength, Dexterity, Pain, UE functional use, IADL, ROM, Fascial restriction, Mobility       Visit Diagnosis: Acute pain of right shoulder  Stiffness of right shoulder, not elsewhere classified  Other symptoms and signs involving the musculoskeletal system    Problem List Patient Active Problem List   Diagnosis Date Noted  . Syncope 04/18/2019  . Chronic systolic heart failure ([place])   . Pain of both hip  joints   . History of renal cell carcinoma   . Renal mass 03/30/2019  . Abdominal hematoma 08/13/2018  . Radiculopathy 06/16/2018  . Acute CVA (cerebrovascular accident) ([place]) 02/01/2017  . Diabetes mellitus ([place]) 02/01/2017  . HTN (hypertension) 02/01/2017  . Primary osteoarthritis of left hip 01/08/2016  . DJD (degenerative joint disease) 03/20/2015  . Primary osteoarthritis of right hip 02/26/2015  . Lumbar spondylosis 10/17/2014  . Lumbago 07/25/2014  . Abnormality of gait 07/25/2014  . Difficulty in walking(719.7) 07/25/2014  . TIA (transient ischemic attack) 03/09/2013  . Cocaine abuse ([place]) 03/09/2013  . Accelerated hypertension 03/09/2013  . Hyperlipidemia 03/09/2013  . Current smoker 03/09/2013   Guadelupe Sabin, OTR/L  [phone] 05/29/2020, 5:38 PM  [place] [facility] [address] [place], [place], [address] Phone: [phone]   Fax:  [phone]  Name: Javier Gell. MRN: [id number] Date of Birth: 11-06-1961

## 2020-05-31 ENCOUNTER — Ambulatory Visit (HOSPITAL_COMMUNITY): Payer: Medicaid Other | Admitting: Specialist

## 2020-06-05 ENCOUNTER — Ambulatory Visit (HOSPITAL_COMMUNITY): Payer: Medicaid Other | Attending: Orthopedic Surgery | Admitting: Specialist

## 2020-06-05 DIAGNOSIS — M25611 Stiffness of right shoulder, not elsewhere classified: Secondary | ICD-10-CM | POA: Insufficient documentation

## 2020-06-05 DIAGNOSIS — M25511 Pain in right shoulder: Secondary | ICD-10-CM | POA: Insufficient documentation

## 2020-06-05 DIAGNOSIS — R29898 Other symptoms and signs involving the musculoskeletal system: Secondary | ICD-10-CM | POA: Insufficient documentation

## 2020-06-06 ENCOUNTER — Telehealth: Payer: Self-pay | Admitting: Orthopedic Surgery

## 2020-06-06 NOTE — Telephone Encounter (Signed)
We will transition to [facility] 04/02/2024 1 p.o. every 8-12 hours #40 with no refills for 3 weeks on Friday

## 2020-06-06 NOTE — Telephone Encounter (Signed)
Pls advise thanks.  

## 2020-06-06 NOTE — Telephone Encounter (Signed)
Patient's wife called. Says patient would like a refill for oxycodone called in. Her call back number is [phone]

## 2020-06-06 NOTE — Telephone Encounter (Signed)
I called and advised per below. Patients wife stated that Norco does not even touch the pain with what he is dealing with but will discuss with you at the scheduled follow up appointment on Friday.

## 2020-06-07 ENCOUNTER — Ambulatory Visit (HOSPITAL_COMMUNITY): Payer: Medicaid Other | Admitting: Occupational Therapy

## 2020-06-08 ENCOUNTER — Ambulatory Visit (INDEPENDENT_AMBULATORY_CARE_PROVIDER_SITE_OTHER): Payer: Medicaid Other

## 2020-06-08 ENCOUNTER — Telehealth (HOSPITAL_COMMUNITY): Payer: Self-pay | Admitting: Occupational Therapy

## 2020-06-08 ENCOUNTER — Ambulatory Visit (INDEPENDENT_AMBULATORY_CARE_PROVIDER_SITE_OTHER): Payer: Medicaid Other | Admitting: Orthopedic Surgery

## 2020-06-08 DIAGNOSIS — M79641 Pain in right hand: Secondary | ICD-10-CM

## 2020-06-08 DIAGNOSIS — M5412 Radiculopathy, cervical region: Secondary | ICD-10-CM | POA: Diagnosis not present

## 2020-06-08 MED ORDER — HYDROCODONE-ACETAMINOPHEN 5-325 MG PO TABS: 1.0000 | ORAL_TABLET | Freq: Three times a day (TID) | ORAL | 0 refills | Status: DC | PRN

## 2020-06-08 MED ORDER — GABAPENTIN 100 MG PO CAPS
100.0000 mg | ORAL_CAPSULE | Freq: Three times a day (TID) | ORAL | 0 refills | Status: DC
Start: 2020-06-08 — End: 2020-10-09

## 2020-06-08 NOTE — Telephone Encounter (Signed)
Charis l/m -Pt has missed two apptments - due to policy all future apptments will be cx per CC. Pt can only schedule one apptment at a time

## 2020-06-09 ENCOUNTER — Encounter: Payer: Self-pay | Admitting: Orthopedic Surgery

## 2020-06-09 NOTE — Progress Notes (Signed)
Office Visit Note   Patient: Raymond Barrera.           Date of Birth: 1960-12-06           MRN: [id number] Visit Date: 06/08/2020 Requested by: Lucia Gaskins, MD [address] [place],  [place] [address] PCP: Lucia Gaskins, MD  Subjective: Chief Complaint  Patient presents with  . Post-op Follow-up    HPI: Raymond Barrera is a patient who is now about 8 weeks out right shoulder rotator cuff repair and shoulder arthroscopy.  Also had biceps tenodesis.  He has been taking a lot of pain medicine and wants to continue to take pain medicine.  We are in a taper mode on the pain medicine.  His brace CPM is at 75.  He did miss 2 occupational therapy appointments this week.  He describes hand numbness and tingling for 1 month which radiates from the neck and trapezial region into the hand on the dorsal and palmar surface.  Denies much in the way of left-sided symptoms.  This has been going on for 1 month.              ROS: All systems reviewed are negative as they relate to the chief complaint within the history of present illness.  Patient denies  fevers or chills.   Assessment & Plan: Visit Diagnoses:  1. Pain of right hand   2. Radiculopathy, cervical region     Plan: Impression is pretty well-functioning rotator cuff repair.  Rotator cuff strength is improving and range of motion is actually good.  I do want him to be careful with any lifting away from his body for the next 4 weeks.  Regarding the hand numbness and tingling and neck issues I think he has cervical radiculopathy versus carpal tunnel syndrome.  Negative Tinel's in the cubital tunnel.  C-spine radiographs do demonstrate degenerative changes and he has had prior lumbar fusion.  Plan is Neurontin 100 mg 3 times a day for the next several weeks.  Taper down to Norco 04/02/2024 1 every 8 hours.  Nerve conduction to rule out carpal tunnel syndrome versus radiculopathy.  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  Follow-Up Instructions: No follow-ups on file.   Orders:  Orders Placed This Encounter  Procedures  . XR Hand Complete Right  . XR Cervical Spine 2 or 3 views  . Ambulatory referral to Physical Medicine Rehab   Meds ordered this encounter  Medications  . gabapentin (NEURONTIN) 100 MG capsule    Sig: Take 1 capsule (100 mg total) by mouth 3 (three) times daily.    Dispense:  45 capsule    Refill:  0  . HYDROcodone-acetaminophen (NORCO/VICODIN) 5-325 MG tablet    Sig: Take 1 tablet by mouth every 8 (eight) hours as needed for moderate pain.    Dispense:  40 tablet    Refill:  0      Procedures: No procedures performed   Clinical Data: No additional findings.  Objective: Vital Signs: There were no vitals taken for this visit.  Physical Exam:   Constitutional: Patient appears well-developed HEENT:  Head: Normocephalic Eyes:EOM are normal Neck: Normal range of motion Cardiovascular: Normal rate Pulmonary/chest: Effort normal Neurologic: Patient is alert Skin: Skin is warm Psychiatric: Patient has normal mood and affect    Ortho Exam: Ortho exam  demonstrates mild tenderness with neck range of motion with some loss of full extension.  Rotation is about 60 degrees bilaterally.  He has 5 out of 5 grip EPL FPL interosseous wrist flexion extension bicep triceps and deltoid strength with palpable radial pulses.  No masses lymphadenopathy or skin changes noted in that shoulder girdle region.  No coarse grinding or crepitus with passive range of motion of the shoulder.  Forward flexion and abduction both are right around 90 degrees which is good.  External rotation at 15 degrees of abduction is about 50 on the right compared to 70 on the left.  No definite paresthesias C5-T1.  Negative Tinel's cubital tunnel in the elbow.  Equivocal compression testing over the median nerve right  versus left.  No muscle atrophy in the right arm versus left.  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  Specialty Comments:  No specialty comments available.  Imaging: No results found.   PMFS History: Patient Active Problem List   Diagnosis Date Noted  . Syncope 04/18/2019  . Chronic systolic heart failure ([place])   . Pain of both hip joints   . History of renal cell carcinoma   . Renal mass 03/30/2019  . Abdominal hematoma 08/13/2018  . Radiculopathy 06/16/2018  . Acute CVA (cerebrovascular accident) ([place]) 02/01/2017  . Diabetes mellitus ([place]) 02/01/2017  . HTN (hypertension) 02/01/2017  . Primary osteoarthritis of left hip 01/08/2016  . DJD (degenerative joint disease) 03/20/2015  . Primary osteoarthritis of right hip 02/26/2015  . Lumbar spondylosis 10/17/2014  . Lumbago 07/25/2014  . Abnormality of gait 07/25/2014  . Difficulty in walking(719.7) 07/25/2014  . TIA (transient ischemic attack) 03/09/2013  . Cocaine abuse ([place]) 03/09/2013  . Accelerated hypertension 03/09/2013  . Hyperlipidemia 03/09/2013  . Current smoker 03/09/2013   Past Medical History:  Diagnosis Date  . Ambulates with cane    straight - uses occasionally  . Anxiety    due to the stroke  . Arthritis   . Back pain    hx of buldging disc  . Complication of anesthesia    took a while for him to wake up after previous anesthesia  . Diabetes mellitus without complication ([place])   . Family history of adverse reaction to anesthesia    "sometimes mom has a hard time waking up"  . High cholesterol    takes Zocor daily  . Hypertension    takes Benazepril and HCTZ  daily  . Joint pain   . Joint swelling   . Memory impairment    occassional - from stroke  . Myocardial infarction ([place]) 1987  . Pneumonia    hx of-80's  . Shortness of breath dyspnea    do to pain  . Sleep apnea    never had a sleep study,but states Dr. Cindie Laroche says he has it  . Slurred speech   . Stroke ([place] [facility]) 08/2013   7  mini-strokes, last stroke 2017  . TIA (transient ischemic attack) 2014   x 7   . Urinary frequency     Family History  Problem Relation Age of Onset  . Heart disease Father     Past Surgical History:  Procedure Laterality Date  . ABDOMINAL EXPOSURE N/A 06/16/2018   Procedure: ABDOMINAL EXPOSURE;  Surgeon: Rosetta Posner, MD;  Location: [facility];  Service: Vascular;  Laterality: N/A;  . ANKLE SURGERY  2008   left ankle-otif-[facility]  . ANTERIOR LUMBAR FUSION Bilateral 06/16/2018   Procedure: LUMBAR 4-5 LUMBAR 5-SACRUM  1 ANTERIOR LUMBAR INTERBODY FUSION WITH INSTRUMENTATION AND ALLOGRAFT;  Surgeon: Phylliss Bob, MD;  Location: [facility];  Service: Orthopedics;  Laterality: Bilateral;  . BACK SURGERY    . HEMATOMA EVACUATION Left 08/13/2018   Procedure: EVACUATION HEMATOMA LEFT ABDOMINAL WALL;  Surgeon: Rosetta Posner, MD;  [facility];  Service: Vascular;  Laterality: Left;  . IR RADIOLOGIST EVAL & MGMT  07/27/2018  . IR US GUIDE BX ASP/DRAIN  07/14/2018  . JOINT REPLACEMENT     both hips replaced   . LUMBAR LAMINECTOMY/DECOMPRESSION MICRODISCECTOMY Right 10/17/2014   Procedure: LUMBAR LAMINECTOMY/DECOMPRESSION MICRODISCECTOMY 2 LEVELS;  Surgeon: [name], MD;  Location: [facility];  Service: Neurosurgery;  Laterality: Right;  Right L45 L5S1 laminectomy and foraminotomy  . MASS EXCISION  09/13/2012   Procedure: EXCISION MASS;  Surgeon: Jamesetta So, MD;  [facility];  Service: General;  Laterality: N/A;  . ROBOTIC ASSITED PARTIAL NEPHRECTOMY Left 03/30/2019   Procedure: XI ROBOTIC ASSITED PARTIAL NEPHRECTOMY POSSIBLE RADICAL NEPHRECTOMY;  Surgeon: Ceasar Mons, MD;  [facility];  Service: Urology;  Laterality: Left;  . SHOULDER ARTHROSCOPY WITH ROTATOR CUFF REPAIR Right 04/12/2020   Procedure: right shoulder arthroscopy, debridement, mini open rotator cuff tear repair;  Surgeon: Meredith Pel, MD;  Location: [facility];  Service: Orthopedics;  Laterality: Right;    . TONSILLECTOMY    . TOTAL HIP ARTHROPLASTY Right 03/20/2015  . TOTAL HIP ARTHROPLASTY Right 03/20/2015   Procedure: TOTAL HIP ARTHROPLASTY ANTERIOR APPROACH;  Surgeon: Renette Butters, MD;  Location: [facility];  Service: Orthopedics;  Laterality: Right;  . TOTAL HIP ARTHROPLASTY Left 01/08/2016   Procedure: TOTAL HIP ARTHROPLASTY ANTERIOR APPROACH;  Surgeon: Renette Butters, MD;  Location: [facility];  Service: Orthopedics;  Laterality: Left;   Social History   Occupational History  . Not on file  Tobacco Use  . Smoking status: Current Some Day Smoker    Packs/day: 0.25    Years: 47.00    Pack years: 11.75    Types: Cigarettes  . Smokeless tobacco: Never Used  Vaping Use  . Vaping Use: Never used  Substance and Sexual Activity  . Alcohol use: Not Currently    Comment: quit 2012  . Drug use: Not Currently    Types: Cocaine    Comment: many yrs ago., last time- late 2016  . Sexual activity: Yes

## 2020-06-12 ENCOUNTER — Ambulatory Visit (HOSPITAL_COMMUNITY): Payer: Medicaid Other | Admitting: Specialist

## 2020-06-12 ENCOUNTER — Telehealth (HOSPITAL_COMMUNITY): Payer: Self-pay | Admitting: Occupational Therapy

## 2020-06-12 ENCOUNTER — Ambulatory Visit (HOSPITAL_COMMUNITY): Payer: Medicaid Other | Admitting: Occupational Therapy

## 2020-06-12 ENCOUNTER — Encounter (HOSPITAL_COMMUNITY): Payer: Self-pay | Admitting: Specialist

## 2020-06-12 ENCOUNTER — Other Ambulatory Visit: Payer: Self-pay

## 2020-06-12 DIAGNOSIS — R29898 Other symptoms and signs involving the musculoskeletal system: Secondary | ICD-10-CM | POA: Diagnosis present

## 2020-06-12 DIAGNOSIS — M25611 Stiffness of right shoulder, not elsewhere classified: Secondary | ICD-10-CM | POA: Diagnosis present

## 2020-06-12 DIAGNOSIS — M25511 Pain in right shoulder: Secondary | ICD-10-CM | POA: Diagnosis not present

## 2020-06-12 NOTE — Therapy (Signed)
[place], [place], [address] Phone: [phone]   Fax:  [phone]  Occupational Therapy Treatment  Patient Details  Name: Raymond Barrera. MRN: [id number] Date of Birth: 03-30-1961 Referring Provider (OT): Dr. Meredith Pel    Encounter Date: 06/12/2020   OT End of Session - 06/12/20 1351    Visit Number 7    Number of Visits 16    Date for OT Re-Evaluation 06/26/20    Authorization Type Medicaid    Authorization Time Period Medicaid approved 12 visits from 7/2-8/12    Authorization - Visit Number 1    Authorization - Number of Visits 12    OT Start Time 4627    OT Stop Time 1343    OT Time Calculation (min) 40 min    Activity Tolerance Patient tolerated treatment well    Behavior During Therapy [facility] for tasks assessed/performed           Past Medical History:  Diagnosis Date  . Ambulates with cane    straight - uses occasionally  . Anxiety    due to the stroke  . Arthritis   . Back pain    hx of buldging disc  . Complication of anesthesia    took a while for him to wake up after previous anesthesia  . Diabetes mellitus without complication ([place])   . Family history of adverse reaction to anesthesia    "sometimes mom has a hard time waking up"  . High cholesterol    takes Zocor daily  . Hypertension    takes Benazepril and HCTZ  daily  . Joint pain   . Joint swelling   . Memory impairment    occassional - from stroke  . Myocardial infarction ([facility]) 1987  . Pneumonia    hx of-80's  . Shortness of breath dyspnea    do to pain  . Sleep apnea    never had a sleep study,but states Dr. Cindie Laroche says he has it  . Slurred speech   . Stroke ([facility]) 08/2013   7 mini-strokes, last stroke 2017  . TIA (transient ischemic attack) 2014   x 7   . Urinary frequency     Past Surgical History:  Procedure Laterality Date  . ABDOMINAL EXPOSURE N/A 06/16/2018   Procedure: ABDOMINAL EXPOSURE;   Surgeon: Rosetta Posner, MD;  Location: [facility];  Service: Vascular;  Laterality: N/A;  . ANKLE SURGERY  2008   left ankle-otif-[facility]  . ANTERIOR LUMBAR FUSION Bilateral 06/16/2018   Procedure: LUMBAR 4-5 LUMBAR 5-SACRUM 1 ANTERIOR LUMBAR INTERBODY FUSION WITH INSTRUMENTATION AND ALLOGRAFT;  Surgeon: Phylliss Bob, MD;  Location: [facility];  Service: Orthopedics;  Laterality: Bilateral;  . BACK SURGERY    . HEMATOMA EVACUATION Left 08/13/2018   Procedure: EVACUATION HEMATOMA LEFT ABDOMINAL WALL;  Surgeon: Rosetta Posner, MD;  [facility];  Service: Vascular;  Laterality: Left;  . IR RADIOLOGIST EVAL & MGMT  07/27/2018  . IR US GUIDE BX ASP/DRAIN  07/14/2018  . JOINT REPLACEMENT     both hips replaced   . LUMBAR LAMINECTOMY/DECOMPRESSION MICRODISCECTOMY Right 10/17/2014   Procedure: LUMBAR LAMINECTOMY/DECOMPRESSION MICRODISCECTOMY 2 LEVELS;  Surgeon: [name], MD;  Location: [facility];  Service: Neurosurgery;  Laterality: Right;  Right L45 L5S1 laminectomy and foraminotomy  . MASS EXCISION  09/13/2012   Procedure: EXCISION MASS;  Surgeon: Jamesetta So, MD;  [facility];  Service: General;  Laterality: N/A;  .  ROBOTIC ASSITED PARTIAL NEPHRECTOMY Left 03/30/2019   Procedure: XI ROBOTIC ASSITED PARTIAL NEPHRECTOMY POSSIBLE RADICAL NEPHRECTOMY;  Surgeon: Ceasar Mons, MD;  [facility];  Service: Urology;  Laterality: Left;  . SHOULDER ARTHROSCOPY WITH ROTATOR CUFF REPAIR Right 04/12/2020   Procedure: right shoulder arthroscopy, debridement, mini open rotator cuff tear repair;  Surgeon: Meredith Pel, MD;  Location: [facility];  Service: Orthopedics;  Laterality: Right;  . TONSILLECTOMY    . TOTAL HIP ARTHROPLASTY Right 03/20/2015  . TOTAL HIP ARTHROPLASTY Right 03/20/2015   Procedure: TOTAL HIP ARTHROPLASTY ANTERIOR APPROACH;  Surgeon: Renette Butters, MD;  Location: [facility];  Service: Orthopedics;  Laterality: Right;  . TOTAL HIP ARTHROPLASTY Left 01/08/2016   Procedure:  TOTAL HIP ARTHROPLASTY ANTERIOR APPROACH;  Surgeon: Renette Butters, MD;  Location: [facility];  Service: Orthopedics;  Laterality: Left;    There were no vitals filed for this visit.   Subjective Assessment - 06/12/20 1350    Subjective  S:  The MD said that the biceps is normal.   He thinks I am doing good and wants me to continue with therapy    Currently in Pain? Yes    Pain Score 9     Pain Location Shoulder    Pain Orientation Right    Pain Descriptors / Indicators Aching              [facility] OT Assessment - 06/12/20 0001      Assessment   Medical Diagnosis Status post RCR    Referring Provider (OT) Dr. Meredith Pel       Precautions   Precautions Shoulder    Type of Shoulder Precautions progress as tolerated                    OT Treatments/Exercises (OP) - 06/12/20 0001      Exercises   Exercises Shoulder      Shoulder Exercises: Supine   Protraction AAROM;10 reps    Horizontal ABduction AAROM;10 reps    External Rotation AAROM;10 reps    Internal Rotation AAROM;10 reps    Flexion AAROM;10 reps      Shoulder Exercises: Seated   Row AAROM;10 reps    Protraction AAROM;10 reps    External Rotation AAROM;10 reps    Internal Rotation AAROM;10 reps    Flexion AAROM;10 reps    Abduction AAROM;10 reps      Shoulder Exercises: Standing   Other Standing Exercises pvc pipe slide for improved flexion and reaching - requires min pa to initiate movement       Shoulder Exercises: ROM/Strengthening   UBE (Upper Arm Bike) 3' forward and 3' reverse at 1.0    Other ROM/Strengthening Exercises using large green therapy ball completed wall ball walk up and down 10 times.  attempted ball on wall with green therapy ball and patient unable to complete for longer than 10" due to arm fatigue    Other ROM/Strengthening Exercises using large green therapy ball able to complete chest press x 10, flexion from beginning to chest height with min difficulty and tactile  assistance to initiate movement.  able to bounce ball 10 times without difficulty.  able to chest pass throw and catch with therapist 10 times       Manual Therapy   Manual Therapy Myofascial release    Manual therapy comments manual therapy completed seperate from other treatment interventions    Myofascial Release myofascial release and manual techniques to right upper  arm, trapezius, and scapular regions to decrease pain and fascial restrictions and increase joint ROM                    OT Short Term Goals - 05/08/20 1550      OT SHORT TERM GOAL #1   Title Patient will be educated and independent with HEP to increase functional use of right arm during daily tasks.     Time 4    Period Weeks    Status On-going    Target Date 05/27/20      OT SHORT TERM GOAL #2   Title Patient will increase P/ROM to WNL in order to increase ability to get shirts on and off with less difficulty.     Time 4    Period Weeks    Status On-going      OT SHORT TERM GOAL #3   Title Patient will increase RUE shoulder strength to 3+/5 overall in order to be able to return to reach items at waist to chest height.    Time 4    Period Weeks    Status On-going             OT Long Term Goals - 05/08/20 1550      OT LONG TERM GOAL #1   Title Patient will return to highest level of functioning while using his right hand as dominant for 75% or more tasks.     Time 8    Period Weeks    Status On-going      OT LONG TERM GOAL #2   Title Patient will increase A/ROM to [facility] to increase ability to reach overhead when standing or in bed with less difficulty.    Time 8    Period Weeks    Status On-going      OT LONG TERM GOAL #3   Title Patient will increase RUE strength to 5/5 in order to return to completing normal weighted household tasks.     Time 8    Period Weeks    Status On-going      OT LONG TERM GOAL #4   Title Patient will report a decrease in pain level of approximately 3/10 or  less to improve ability to sleep for 4 consecutive hours.    Time 8    Period Weeks    Status On-going      OT LONG TERM GOAL #5   Title Patient will decrease fascial restrictions to min amount or less in order to increase functional mobility needed to reach above shoulder level.    Time 8    Period Weeks    Status On-going                 Plan - 06/12/20 1352    Clinical Impression Statement A:  Reviewed scheduling with patient due to no shows will schedule 1 appt at a time.  Patient agrees to this.  Patient with mod-max difficulty initiating flexion and abduction with aa/rom exercises.  once inititated, able to complete 50% aa/rom.  Treatment focused on aarom in prep for arom.  paitent able to use chest pass to throw and catch ball.    Body Structure / Function / Physical Skills ADL;Strength;Dexterity;Pain;UE functional use;IADL;ROM;Fascial restriction;Mobility    Plan P: discontinue passive stretching due to pt guarding,  increase aa/rom reps.  attempt overhead throwing and catching at wall.  focus on proximal shoulder strengthening for increased independence with overhead reaching.  Patient will benefit from skilled therapeutic intervention in order to improve the following deficits and impairments:   Body Structure / Function / Physical Skills: ADL, Strength, Dexterity, Pain, UE functional use, IADL, ROM, Fascial restriction, Mobility       Visit Diagnosis: Acute pain of right shoulder  Stiffness of right shoulder, not elsewhere classified  Other symptoms and signs involving the musculoskeletal system    Problem List Patient Active Problem List   Diagnosis Date Noted  . Syncope 04/18/2019  . Chronic systolic heart failure ([place])   . Pain of both hip joints   . History of renal cell carcinoma   . Renal mass 03/30/2019  . Abdominal hematoma 08/13/2018  . Radiculopathy 06/16/2018  . Acute CVA (cerebrovascular accident) ([place]) 02/01/2017  . Diabetes  mellitus ([place]) 02/01/2017  . HTN (hypertension) 02/01/2017  . Primary osteoarthritis of left hip 01/08/2016  . DJD (degenerative joint disease) 03/20/2015  . Primary osteoarthritis of right hip 02/26/2015  . Lumbar spondylosis 10/17/2014  . Lumbago 07/25/2014  . Abnormality of gait 07/25/2014  . Difficulty in walking(719.7) 07/25/2014  . TIA (transient ischemic attack) 03/09/2013  . Cocaine abuse ([place]) 03/09/2013  . Accelerated hypertension 03/09/2013  . Hyperlipidemia 03/09/2013  . Current smoker 03/09/2013    Vangie Bicker, MHA, OTR/L [phone]  , 1:56 PM  [place] [address] [place], [place], [address] Phone: [phone]   Fax:  [phone]  Name: Lister Brizzi. MRN: [id number] Date of Birth: 02/24/1961

## 2020-06-12 NOTE — Telephone Encounter (Signed)
Charis said to schedule one apptment due to policy. F.Y.I. patient does use [facility] to get to their apptments per wife. Wife will talk to OT about this issue at next visit.

## 2020-06-14 ENCOUNTER — Encounter (HOSPITAL_COMMUNITY): Payer: Medicaid Other | Admitting: Occupational Therapy

## 2020-06-19 ENCOUNTER — Encounter (HOSPITAL_COMMUNITY): Payer: Medicaid Other | Admitting: Occupational Therapy

## 2020-06-20 ENCOUNTER — Other Ambulatory Visit: Payer: Self-pay

## 2020-06-20 ENCOUNTER — Ambulatory Visit (HOSPITAL_COMMUNITY): Payer: Medicaid Other | Admitting: Specialist

## 2020-06-20 ENCOUNTER — Encounter (HOSPITAL_COMMUNITY): Payer: Self-pay | Admitting: Specialist

## 2020-06-20 DIAGNOSIS — M25511 Pain in right shoulder: Secondary | ICD-10-CM | POA: Diagnosis not present

## 2020-06-20 DIAGNOSIS — M25611 Stiffness of right shoulder, not elsewhere classified: Secondary | ICD-10-CM

## 2020-06-20 DIAGNOSIS — R29898 Other symptoms and signs involving the musculoskeletal system: Secondary | ICD-10-CM

## 2020-06-20 NOTE — Therapy (Signed)
[place], [place], [address] Phone: [phone]   Fax:  [phone]  Occupational Therapy Treatment  Patient Details  Name: Raymond Barrera. MRN: [id number] Date of Birth: 12-27-1960 Referring Provider (OT): Dr. Meredith Pel    Encounter Date: 06/20/2020   OT End of Session - 06/20/20 1350    Visit Number 8    Number of Visits 16    Date for OT Re-Evaluation 06/26/20    Authorization Type Medicaid    Authorization Time Period Medicaid approved 12 visits from 7/2-8/12    Authorization - Visit Number 2    Authorization - Number of Visits 12           Past Medical History:  Diagnosis Date  . Ambulates with cane    straight - uses occasionally  . Anxiety    due to the stroke  . Arthritis   . Back pain    hx of buldging disc  . Complication of anesthesia    took a while for him to wake up after previous anesthesia  . Diabetes mellitus without complication ([place])   . Family history of adverse reaction to anesthesia    "sometimes mom has a hard time waking up"  . High cholesterol    takes Zocor daily  . Hypertension    takes Benazepril and HCTZ  daily  . Joint pain   . Joint swelling   . Memory impairment    occassional - from stroke  . Myocardial infarction ([place]) 1987  . Pneumonia    hx of-80's  . Shortness of breath dyspnea    do to pain  . Sleep apnea    never had a sleep study,but states Dr. Cindie Laroche says he has it  . Slurred speech   . Stroke ([place]) 08/2013   7 mini-strokes, last stroke 2017  . TIA (transient ischemic attack) 2014   x 7   . Urinary frequency     Past Surgical History:  Procedure Laterality Date  . ABDOMINAL EXPOSURE N/A 06/16/2018   Procedure: ABDOMINAL EXPOSURE;  Surgeon: Rosetta Posner, MD;  Location: [facility];  Service: Vascular;  Laterality: N/A;  . ANKLE SURGERY  2008   left ankle-otif-[facility]  . ANTERIOR LUMBAR FUSION Bilateral 06/16/2018   Procedure: LUMBAR 4-5  LUMBAR 5-SACRUM 1 ANTERIOR LUMBAR INTERBODY FUSION WITH INSTRUMENTATION AND ALLOGRAFT;  Surgeon: Phylliss Bob, MD;  Location: [facility];  Service: Orthopedics;  Laterality: Bilateral;  . BACK SURGERY    . HEMATOMA EVACUATION Left 08/13/2018   Procedure: EVACUATION HEMATOMA LEFT ABDOMINAL WALL;  Surgeon: Rosetta Posner, MD;  [facility];  Service: Vascular;  Laterality: Left;  . IR RADIOLOGIST EVAL & MGMT  07/27/2018  . IR US GUIDE BX ASP/DRAIN  07/14/2018  . JOINT REPLACEMENT     both hips replaced   . LUMBAR LAMINECTOMY/DECOMPRESSION MICRODISCECTOMY Right 10/17/2014   Procedure: LUMBAR LAMINECTOMY/DECOMPRESSION MICRODISCECTOMY 2 LEVELS;  Surgeon: [name], MD;  Location: [facility];  Service: Neurosurgery;  Laterality: Right;  Right L45 L5S1 laminectomy and foraminotomy  . MASS EXCISION  09/13/2012   Procedure: EXCISION MASS;  Surgeon: Jamesetta So, MD;  [facility];  Service: General;  Laterality: N/A;  . ROBOTIC ASSITED PARTIAL NEPHRECTOMY Left 03/30/2019   Procedure: XI ROBOTIC ASSITED PARTIAL NEPHRECTOMY POSSIBLE RADICAL NEPHRECTOMY;  Surgeon: Ceasar Mons, MD;  [facility];  Service: Urology;  Laterality: Left;  . SHOULDER ARTHROSCOPY WITH ROTATOR CUFF REPAIR  Right 04/12/2020   Procedure: right shoulder arthroscopy, debridement, mini open rotator cuff tear repair;  Surgeon: Meredith Pel, MD;  Location: [facility];  Service: Orthopedics;  Laterality: Right;  . TONSILLECTOMY    . TOTAL HIP ARTHROPLASTY Right 03/20/2015  . TOTAL HIP ARTHROPLASTY Right 03/20/2015   Procedure: TOTAL HIP ARTHROPLASTY ANTERIOR APPROACH;  Surgeon: Renette Butters, MD;  Location: [facility];  Service: Orthopedics;  Laterality: Right;  . TOTAL HIP ARTHROPLASTY Left 01/08/2016   Procedure: TOTAL HIP ARTHROPLASTY ANTERIOR APPROACH;  Surgeon: Renette Butters, MD;  Location: [facility];  Service: Orthopedics;  Laterality: Left;    There were no vitals filed for this visit.   Subjective  Assessment - 06/20/20 1349    Subjective  S:  I am in alot of pain the last few days.  It right in the joint and it is a pinching pain.    Currently in Pain? Yes    Pain Score 8     Pain Location Shoulder    Pain Orientation Right    Pain Descriptors / Indicators Patsi Sears OT Assessment - 06/20/20 0001      Assessment   Medical Diagnosis Status post RCR    Referring Provider (OT) Dr. Meredith Pel       Precautions   Precautions Shoulder    Type of Shoulder Precautions progress as tolerated                    OT Treatments/Exercises (OP) - 06/20/20 0001      Exercises   Exercises Shoulder      Shoulder Exercises: Supine   Protraction AAROM;12 reps    Horizontal ABduction AAROM;12 reps    Flexion AAROM;12 reps      Shoulder Exercises: Seated   Elevation AROM;15 reps    Extension AROM;15 reps    Retraction AROM;15 reps    Protraction AAROM;10 reps   breaks after 3 reps, 6 reps    Horizontal ABduction --   unable to complete    External Rotation AAROM;12 reps    Internal Rotation AAROM;12 reps    Flexion AAROM;10 reps   breaks after 3 reps and 6 reps    Abduction AAROM;10 reps   breaks after 3, 6, 9 reps    Other Seated Exercises seated holding basketball between both hands completed 10 reps of shoulder flexion to 90 degrees, chest press, and elbow flexion.      Shoulder Exercises: Standing   Other Standing Exercises standing threw ball with chest pass and caught at chest level 10 times.  then threw underhand with max verbal guidance to toss at shoulder versus elbow 10 times, with min difficulty, then bounced ball with min vc to bounce with shoulder flexion versus elbow flexion with min-mod difficulty with this technique       Shoulder Exercises: Therapy Ball   Right/Left 5 reps      Manual Therapy   Manual Therapy Myofascial release    Manual therapy comments manual therapy completed seperate from other treatment interventions     Myofascial Release myofascial release and manual techniques to right upper arm, trapezius, and scapular regions to decrease pain and fascial restrictions and increase joint ROM                  OT Education - 06/20/20 1350    Education Details encouraged patient to complete functional reaching tasks  at home from waist to shoulder height    Person(s) Educated Patient    Methods Explanation    Comprehension Verbalized understanding            OT Short Term Goals - 05/08/20 1550      OT SHORT TERM GOAL #1   Title Patient will be educated and independent with HEP to increase functional use of right arm during daily tasks.     Time 4    Period Weeks    Status On-going    Target Date 05/27/20      OT SHORT TERM GOAL #2   Title Patient will increase P/ROM to WNL in order to increase ability to get shirts on and off with less difficulty.     Time 4    Period Weeks    Status On-going      OT SHORT TERM GOAL #3   Title Patient will increase RUE shoulder strength to 3+/5 overall in order to be able to return to reach items at waist to chest height.    Time 4    Period Weeks    Status On-going             OT Long Term Goals - 05/08/20 1550      OT LONG TERM GOAL #1   Title Patient will return to highest level of functioning while using his right hand as dominant for 75% or more tasks.     Time 8    Period Weeks    Status On-going      OT LONG TERM GOAL #2   Title Patient will increase A/ROM to [facility] to increase ability to reach overhead when standing or in bed with less difficulty.    Time 8    Period Weeks    Status On-going      OT LONG TERM GOAL #3   Title Patient will increase RUE strength to 5/5 in order to return to completing normal weighted household tasks.     Time 8    Period Weeks    Status On-going      OT LONG TERM GOAL #4   Title Patient will report a decrease in pain level of approximately 3/10 or less to improve ability to sleep for 4 consecutive  hours.    Time 8    Period Weeks    Status On-going      OT LONG TERM GOAL #5   Title Patient will decrease fascial restrictions to min amount or less in order to increase functional mobility needed to reach above shoulder level.    Time 8    Period Weeks    Status On-going                 Plan - 06/20/20 1351    Clinical Impression Statement A:  patient presents with increased pain this date, requiring frequent rest breaks during exercises for rest due to pain level.  planned to begin a/rom in supine, however, unable to do so due to increased pain with aa/rom demonstrated.    Body Structure / Function / Physical Skills ADL;Strength;Dexterity;Pain;UE functional use;IADL;ROM;Fascial restriction;Mobility    Plan P: discontinue passive stretching due to pt guarding,  increase aa/rom reps in seated.  attempt a/rom in supine.  attempt overhead throwing and catching at wall.  focus on proximal shoulder strengthening for increased independence with overhead reaching.           Patient will benefit from skilled therapeutic intervention in  order to improve the following deficits and impairments:   Body Structure / Function / Physical Skills: ADL, Strength, Dexterity, Pain, UE functional use, IADL, ROM, Fascial restriction, Mobility       Visit Diagnosis: Acute pain of right shoulder  Stiffness of right shoulder, not elsewhere classified  Other symptoms and signs involving the musculoskeletal system    Problem List Patient Active Problem List   Diagnosis Date Noted  . Syncope 04/18/2019  . Chronic systolic heart failure ([place])   . Pain of both hip joints   . History of renal cell carcinoma   . Renal mass 03/30/2019  . Abdominal hematoma 08/13/2018  . Radiculopathy 06/16/2018  . Acute CVA (cerebrovascular accident) ([place]) 02/01/2017  . Diabetes mellitus ([place]) 02/01/2017  . HTN (hypertension) 02/01/2017  . Primary osteoarthritis of left hip 01/08/2016  . DJD (degenerative  joint disease) 03/20/2015  . Primary osteoarthritis of right hip 02/26/2015  . Lumbar spondylosis 10/17/2014  . Lumbago 07/25/2014  . Abnormality of gait 07/25/2014  . Difficulty in walking(719.7) 07/25/2014  . TIA (transient ischemic attack) 03/09/2013  . Cocaine abuse ([place]) 03/09/2013  . Accelerated hypertension 03/09/2013  . Hyperlipidemia 03/09/2013  . Current smoker 03/09/2013    Vangie Bicker, [place], OTR/L [phone]  06/20/2020, 3:06 PM  [facility] [address] [place], [place], [address] Phone: [phone]   Fax:  [phone]  Name: Raymond Barrera. MRN: [id number] Date of Birth: 04-Mar-1961

## 2020-06-21 ENCOUNTER — Encounter (HOSPITAL_COMMUNITY): Payer: Medicaid Other | Admitting: Occupational Therapy

## 2020-06-26 ENCOUNTER — Encounter (HOSPITAL_COMMUNITY): Payer: Medicaid Other | Admitting: Occupational Therapy

## 2020-06-27 ENCOUNTER — Encounter: Payer: Self-pay | Admitting: Cardiovascular Disease

## 2020-06-27 ENCOUNTER — Telehealth: Payer: Self-pay | Admitting: Cardiovascular Disease

## 2020-06-27 NOTE — Telephone Encounter (Signed)
Left a message for the patient to call back.  

## 2020-06-27 NOTE — Telephone Encounter (Signed)
Raymond Barrera is returning Lisa's call.

## 2020-06-27 NOTE — Telephone Encounter (Signed)
The patient will cut his 325 mg aspirin tablet until he is able to get more baby aspirin in a few days.

## 2020-06-27 NOTE — Telephone Encounter (Signed)
Error

## 2020-06-27 NOTE — Telephone Encounter (Signed)
New Message   Pt c/o medication issue:  1. Name of Medication: aspirin EC 81 MG tablet    2. How are you currently taking this medication (dosage and times per day)? Once a day  3. Are you having a reaction (difficulty breathing--STAT)?   4. What is your medication issue? Patients wife Osvaldo Angst is calling on his behalf. She states that they have ran out of the 81mg  and would like to know can he take the 325mg  until they are able to get to the store and purchase the 81mg . Please call to discuss

## 2020-06-28 ENCOUNTER — Ambulatory Visit (HOSPITAL_COMMUNITY): Payer: Medicaid Other | Admitting: Occupational Therapy

## 2020-06-28 ENCOUNTER — Other Ambulatory Visit: Payer: Self-pay

## 2020-06-28 DIAGNOSIS — R29898 Other symptoms and signs involving the musculoskeletal system: Secondary | ICD-10-CM

## 2020-06-28 DIAGNOSIS — M25511 Pain in right shoulder: Secondary | ICD-10-CM

## 2020-06-28 DIAGNOSIS — M25611 Stiffness of right shoulder, not elsewhere classified: Secondary | ICD-10-CM

## 2020-06-28 NOTE — Therapy (Addendum)
[place], [place], [address] Phone: [phone]   Fax:  [phone]  Occupational Therapy Re-assessment and Treatment  Patient Details  Name: Raymond Barrera. MRN: [id number] Date of Birth: Nov 09, 1961 Referring Provider (OT): Dr. Meredith Pel    Encounter Date: 06/28/2020   OT End of Session - 06/28/20 1056    Visit Number 9    Number of Visits 16    Date for OT Re-Evaluation 07/12/20    Authorization Type Medicaid    Authorization Time Period Medicaid approved 12 visits from 7/2-8/12    Authorization - Visit Number 3    Authorization - Number of Visits 12    OT Start Time 5573    OT Stop Time 1115    OT Time Calculation (min) 44 min           Past Medical History:  Diagnosis Date  . Ambulates with cane    straight - uses occasionally  . Anxiety    due to the stroke  . Arthritis   . Back pain    hx of buldging disc  . Complication of anesthesia    took a while for him to wake up after previous anesthesia  . Diabetes mellitus without complication ([place])   . Family history of adverse reaction to anesthesia    "sometimes mom has a hard time waking up"  . High cholesterol    takes Zocor daily  . Hypertension    takes Benazepril and HCTZ  daily  . Joint pain   . Joint swelling   . Memory impairment    occassional - from stroke  . Myocardial infarction ([place]) 1987  . Pneumonia    hx of-80's  . Shortness of breath dyspnea    do to pain  . Sleep apnea    never had a sleep study,but states Dr. Cindie Laroche says he has it  . Slurred speech   . Stroke ([place]) 08/2013   7 mini-strokes, last stroke 2017  . TIA (transient ischemic attack) 2014   x 7   . Urinary frequency     Past Surgical History:  Procedure Laterality Date  . ABDOMINAL EXPOSURE N/A 06/16/2018   Procedure: ABDOMINAL EXPOSURE;  Surgeon: Rosetta Posner, MD;  Location: [facility];  Service: Vascular;  Laterality: N/A;  . ANKLE SURGERY   2008   left ankle-otif-[facility]  . ANTERIOR LUMBAR FUSION Bilateral 06/16/2018   Procedure: LUMBAR 4-5 LUMBAR 5-SACRUM 1 ANTERIOR LUMBAR INTERBODY FUSION WITH INSTRUMENTATION AND ALLOGRAFT;  Surgeon: Phylliss Bob, MD;  Location: [facility];  Service: Orthopedics;  Laterality: Bilateral;  . BACK SURGERY    . HEMATOMA EVACUATION Left 08/13/2018   Procedure: EVACUATION HEMATOMA LEFT ABDOMINAL WALL;  Surgeon: Rosetta Posner, MD;  [facility];  Service: Vascular;  Laterality: Left;  . IR RADIOLOGIST EVAL & MGMT  07/27/2018  . IR US GUIDE BX ASP/DRAIN  07/14/2018  . JOINT REPLACEMENT     both hips replaced   . LUMBAR LAMINECTOMY/DECOMPRESSION MICRODISCECTOMY Right 10/17/2014   Procedure: LUMBAR LAMINECTOMY/DECOMPRESSION MICRODISCECTOMY 2 LEVELS;  Surgeon: [name], MD;  Location: [facility];  Service: Neurosurgery;  Laterality: Right;  Right L45 L5S1 laminectomy and foraminotomy  . MASS EXCISION  09/13/2012   Procedure: EXCISION MASS;  Surgeon: Jamesetta So, MD;  [facility];  Service: General;  Laterality: N/A;  . ROBOTIC ASSITED PARTIAL NEPHRECTOMY Left 03/30/2019   Procedure: XI ROBOTIC ASSITED PARTIAL NEPHRECTOMY POSSIBLE RADICAL  NEPHRECTOMY;  Surgeon: Ceasar Mons, MD;  [facility];  Service: Urology;  Laterality: Left;  . SHOULDER ARTHROSCOPY WITH ROTATOR CUFF REPAIR Right 04/12/2020   Procedure: right shoulder arthroscopy, debridement, mini open rotator cuff tear repair;  Surgeon: Meredith Pel, MD;  Location: [facility];  Service: Orthopedics;  Laterality: Right;  . TONSILLECTOMY    . TOTAL HIP ARTHROPLASTY Right 03/20/2015  . TOTAL HIP ARTHROPLASTY Right 03/20/2015   Procedure: TOTAL HIP ARTHROPLASTY ANTERIOR APPROACH;  Surgeon: Renette Butters, MD;  Location: [facility];  Service: Orthopedics;  Laterality: Right;  . TOTAL HIP ARTHROPLASTY Left 01/08/2016   Procedure: TOTAL HIP ARTHROPLASTY ANTERIOR APPROACH;  Surgeon: Renette Butters, MD;  Location: [facility];  Service:  Orthopedics;  Laterality: Left;    There were no vitals filed for this visit.   Subjective Assessment - 06/28/20 1046    Subjective  S: I have been using that mechine at [facility] (in reference to CPM)    Pertinent History Pt is a 59 year old male, status post RCR on 04/12/2020. Presenting with wife for evaluation, no sling. Pt referred to occupational therapy for evaluation and treatment by Dr. Meredith Pel.    Currently in Pain? Yes    Pain Score 5     Pain Location Shoulder    Pain Orientation Right    Pain Descriptors / Indicators Aching;Sore              [facility] OT Assessment - 06/28/20 0001      Assessment   Medical Diagnosis Status post RCR    Referring Provider (OT) Dr. Meredith Pel       Precautions   Precautions Shoulder    Type of Shoulder Precautions progress as tolerated      ROM / Strength   AROM / PROM / Strength AROM;PROM      AROM   Overall AROM Comments Assess seated    AROM Assessment Site Shoulder    Right/Left Shoulder Right    Right Shoulder Flexion 138 Degrees    Right Shoulder ABduction 125 Degrees    Right Shoulder Internal Rotation 90 Degrees    Right Shoulder External Rotation 59 Degrees      PROM   Overall PROM Comments Assessed supine, er/IR adducted    PROM Assessment Site Shoulder    Right/Left Shoulder Right    Right Shoulder Flexion 138 Degrees   30   Right Shoulder ABduction 96 Degrees   50   Right Shoulder Internal Rotation 90 Degrees   90   Right Shoulder External Rotation 28 Degrees   33     Strength   Strength Assessment Site Shoulder    Right/Left Shoulder Right    Right Shoulder Flexion 3+/5    Right Shoulder ABduction 3/5    Right Shoulder Internal Rotation 4-/5    Right Shoulder External Rotation 3/5                    OT Treatments/Exercises (OP) - 06/28/20 1036      Exercises   Exercises Shoulder      Shoulder Exercises: Supine   Horizontal ABduction AROM;10 reps    External Rotation AROM;10  reps    External Rotation Limitations Support at elbow    Internal Rotation AROM;10 reps    Internal Rotation Limitations Support at elbow    Flexion AROM;10 reps    ABduction AROM;10 reps    ABduction Limitations Support at elbow  Shoulder Exercises: Seated   Row AAROM;15 reps    Horizontal ABduction AAROM;15 reps    External Rotation AAROM;15 reps    Internal Rotation AAROM;15 reps    Flexion AAROM;15 reps    Abduction AAROM;15 reps      Shoulder Exercises: Standing   External Rotation Strengthening;10 reps;Theraband    Theraband Level (Shoulder External Rotation) Level 2 (Red)    Internal Rotation Strengthening;10 reps;Theraband    Theraband Level (Shoulder Internal Rotation) Level 2 (Red)    Extension Strengthening;10 reps;Theraband    Theraband Level (Shoulder Extension) Level 2 (Red)    Row Strengthening;10 reps;Theraband    Theraband Level (Shoulder Row) Level 2 (Red)      Shoulder Exercises: ROM/Strengthening   Ball on Wall 1' forward flexion; ~20 sec pt using LUE to assist    Other ROM/Strengthening Exercises Large green therapy ball; overhead toss and catch 15x; three times    Other ROM/Strengthening Exercises IR/ER. Ball pass behind back; 10x both directions; tennis ball      Manual Therapy   Manual Therapy Myofascial release    Manual therapy comments manual therapy completed seperate from other treatment interventions    Myofascial Release myofascial release and manual techniques to right upper arm, trapezius, and scapular regions to decrease pain and fascial restrictions and increase joint ROM                    OT Short Term Goals - 06/28/20 1058      OT SHORT TERM GOAL #1   Title Patient will be educated and independent with HEP to increase functional use of right arm during daily tasks.     Time 4    Period Weeks    Status Partially Met    Target Date 05/27/20      OT SHORT TERM GOAL #2   Title Patient will increase P/ROM to WNL in order  to increase ability to get shirts on and off with less difficulty.     Time 4    Period Weeks    Status Partially Met      OT SHORT TERM GOAL #3   Title Patient will increase RUE shoulder strength to 3+/5 overall in order to be able to return to reach items at waist to chest height.    Time 4    Period Weeks    Status Partially Met      OT SHORT TERM GOAL #4   Status Partially Met             OT Long Term Goals - 06/28/20 1126      OT LONG TERM GOAL #1   Title Patient will return to highest level of functioning while using his right hand as dominant for 75% or more tasks.     Time 8    Period Weeks    Status Partially Met      OT LONG TERM GOAL #2   Title Patient will increase A/ROM to [facility] to increase ability to reach overhead when standing or in bed with less difficulty.    Time 8    Period Weeks    Status Partially Met      OT LONG TERM GOAL #3   Title Patient will increase RUE strength to 5/5 in order to return to completing normal weighted household tasks.     Time 8    Period Weeks    Status Not Met      OT LONG TERM GOAL #  4   Title Patient will report a decrease in pain level of approximately 3/10 or less to improve ability to sleep for 4 consecutive hours.    Time 8    Period Weeks    Status Not Met      OT LONG TERM GOAL #5   Title Patient will decrease fascial restrictions to min amount or less in order to increase functional mobility needed to reach above shoulder level.    Time 8    Period Weeks    Status Not Met                 Plan - 06/28/20 1053    Clinical Impression Statement A: Re-assessment with measurements showing progress with PROM; Also taking AROM mearsurements and strength. Continues myofascial release and adding AROM in supine. Pt performing AAROM seated with increased reps. Proximal strengthening with therabanads, ball toss, and ball on wall. IR/ER with ball pass. Cues throughout for form and technique.    Body Structure /  Function / Physical Skills ADL;Strength;Dexterity;Pain;UE functional use;IADL;ROM;Fascial restriction;Mobility    OT Frequency 2x / week    Plan P: Discontinue PROM in supine. Continue AROM in supine, AAROM in seated, and overhead throwing at wall. Add functional reach tasks. Provide red theraband and handout to add to HEP.    OT Home Exercise Plan 04/25/2020: table slides; 6/8: scapular A/ROM. 6/18 AAROM    Consulted and Agree with Plan of Care Patient;Family member/caregiver    Family Member Consulted Wife, Diane           Patient will benefit from skilled therapeutic intervention in order to improve the following deficits and impairments:   Body Structure / Function / Physical Skills: ADL, Strength, Dexterity, Pain, UE functional use, IADL, ROM, Fascial restriction, Mobility       Visit Diagnosis: Acute pain of right shoulder  Stiffness of right shoulder, not elsewhere classified  Other symptoms and signs involving the musculoskeletal system    Problem List Patient Active Problem List   Diagnosis Date Noted  . Syncope 04/18/2019  . Chronic systolic heart failure ([place])   . Pain of both hip joints   . History of renal cell carcinoma   . Renal mass 03/30/2019  . Abdominal hematoma 08/13/2018  . Radiculopathy 06/16/2018  . Acute CVA (cerebrovascular accident) ([place]) 02/01/2017  . Diabetes mellitus ([place]) 02/01/2017  . HTN (hypertension) 02/01/2017  . Primary osteoarthritis of left hip 01/08/2016  . DJD (degenerative joint disease) 03/20/2015  . Primary osteoarthritis of right hip 02/26/2015  . Lumbar spondylosis 10/17/2014  . Lumbago 07/25/2014  . Abnormality of gait 07/25/2014  . Difficulty in walking(719.7) 07/25/2014  . TIA (transient ischemic attack) 03/09/2013  . Cocaine abuse ([place]) 03/09/2013  . Accelerated hypertension 03/09/2013  . Hyperlipidemia 03/09/2013  . Current smoker 03/09/2013    Neal Dy, MSOT, OTR/L 06/28/2020, 11:32 AM  [facility] [address] [place], [place], [address] Phone: [phone]   Fax:  [phone]  Name: Raymond Barrera. MRN: [id number] Date of Birth: March 23, 1961

## 2020-06-29 ENCOUNTER — Encounter (HOSPITAL_COMMUNITY): Payer: Medicaid Other | Admitting: Occupational Therapy

## 2020-07-02 ENCOUNTER — Telehealth: Payer: Self-pay | Admitting: Orthopedic Surgery

## 2020-07-02 NOTE — Telephone Encounter (Signed)
Pls advise.  

## 2020-07-02 NOTE — Telephone Encounter (Signed)
[name] called in asking to speak with Dr. Randel Pigg assistant regarding restrictions that Raymond Barrera may have after surgery.  Will he be able to use a lawn tractor, weed eater, and trim hedges? He has filed for disability and she states that they need to know any restrictions now that would impact their decision regarding his disability.

## 2020-07-03 ENCOUNTER — Telehealth: Payer: Self-pay | Admitting: Orthopedic Surgery

## 2020-07-03 NOTE — Telephone Encounter (Signed)
Okay to lift 5 pounds 3 months after surgery okay to lift 15 pounds 4 months after surgery and lifting as tolerated 6 months out

## 2020-07-03 NOTE — Telephone Encounter (Signed)
Patient's wife [name] called for an update from Dr. Marlou Sa. Please call her at [phone]

## 2020-07-03 NOTE — Telephone Encounter (Signed)
Patient wife calling again. Please see prior note I sent to you.

## 2020-07-04 NOTE — Telephone Encounter (Signed)
Okay for Norco 04/02/2024 1 p.o. every 12 hours as needed pain #40 for 4 weeks and then he will need to be referred to pain management if he needs pain medicine after that.  Thanks

## 2020-07-04 NOTE — Telephone Encounter (Signed)
Pain medication will run out on Sunday and is requesting a rxrf.  IC advised note done. She will try to access through mychart.

## 2020-07-05 ENCOUNTER — Other Ambulatory Visit: Payer: Self-pay | Admitting: Orthopedic Surgery

## 2020-07-05 MED ORDER — HYDROCODONE-ACETAMINOPHEN 5-325 MG PO TABS: 1.0000 | ORAL_TABLET | Freq: Two times a day (BID) | ORAL | 0 refills | Status: DC | PRN

## 2020-07-05 NOTE — Telephone Encounter (Signed)
Done thx

## 2020-07-05 NOTE — Telephone Encounter (Signed)
Tried calling. No answer. LMVM advising per Dr Marlou Sa.

## 2020-07-05 NOTE — Telephone Encounter (Signed)
Can you please submit rx and I will advise about pain management.

## 2020-07-06 ENCOUNTER — Encounter (HOSPITAL_COMMUNITY): Payer: Self-pay | Admitting: Occupational Therapy

## 2020-07-06 ENCOUNTER — Ambulatory Visit (HOSPITAL_COMMUNITY): Payer: Medicaid Other | Attending: Orthopedic Surgery | Admitting: Occupational Therapy

## 2020-07-06 ENCOUNTER — Other Ambulatory Visit: Payer: Self-pay

## 2020-07-06 DIAGNOSIS — M25511 Pain in right shoulder: Secondary | ICD-10-CM | POA: Diagnosis not present

## 2020-07-06 DIAGNOSIS — M25611 Stiffness of right shoulder, not elsewhere classified: Secondary | ICD-10-CM | POA: Diagnosis present

## 2020-07-06 DIAGNOSIS — R29898 Other symptoms and signs involving the musculoskeletal system: Secondary | ICD-10-CM | POA: Diagnosis present

## 2020-07-06 NOTE — Patient Instructions (Signed)
Provided with red theraband and shoulder theraband HEP.

## 2020-07-06 NOTE — Therapy (Signed)
[facility] [place], [place], [address] Phone: [phone]   Fax:  [phone]  Occupational Therapy Treatment  Patient Details  Name: Raymond Barrera. MRN: [id number] Date of Birth: May 28, 1961 Referring Provider (OT): Dr. Meredith Pel    Encounter Date: 07/06/2020   OT End of Session - 07/06/20 1317    Visit Number 10    Number of Visits 16    Date for OT Re-Evaluation 07/12/20    Authorization Type Medicaid    Authorization Time Period Medicaid approved 12 visits from 7/2-8/12    Authorization - Visit Number 4    Authorization - Number of Visits 12    OT Start Time 9233    OT Stop Time 1117    OT Time Calculation (min) 45 min           Past Medical History:  Diagnosis Date  . Ambulates with cane    straight - uses occasionally  . Anxiety    due to the stroke  . Arthritis   . Back pain    hx of buldging disc  . Complication of anesthesia    took a while for him to wake up after previous anesthesia  . Diabetes mellitus without complication ([place])   . Family history of adverse reaction to anesthesia    "sometimes mom has a hard time waking up"  . High cholesterol    takes Zocor daily  . Hypertension    takes Benazepril and HCTZ  daily  . Joint pain   . Joint swelling   . Memory impairment    occassional - from stroke  . Myocardial infarction ([place]) 1987  . Pneumonia    hx of-80's  . Shortness of breath dyspnea    do to pain  . Sleep apnea    never had a sleep study,but states Dr. Cindie Laroche says he has it  . Slurred speech   . Stroke ([place]) 08/2013   7 mini-strokes, last stroke 2017  . TIA (transient ischemic attack) 2014   x 7   . Urinary frequency     Past Surgical History:  Procedure Laterality Date  . ABDOMINAL EXPOSURE N/A 06/16/2018   Procedure: ABDOMINAL EXPOSURE;  Surgeon: Rosetta Posner, MD;  Location: [facility];  Service: Vascular;  Laterality: N/A;  . ANKLE SURGERY  2008   left  ankle-otif-[facility]  . ANTERIOR LUMBAR FUSION Bilateral 06/16/2018   Procedure: LUMBAR 4-5 LUMBAR 5-SACRUM 1 ANTERIOR LUMBAR INTERBODY FUSION WITH INSTRUMENTATION AND ALLOGRAFT;  Surgeon: Phylliss Bob, MD;  Location: [facility];  Service: Orthopedics;  Laterality: Bilateral;  . BACK SURGERY    . HEMATOMA EVACUATION Left 08/13/2018   Procedure: EVACUATION HEMATOMA LEFT ABDOMINAL WALL;  Surgeon: Rosetta Posner, MD;  [facility];  Service: Vascular;  Laterality: Left;  . IR RADIOLOGIST EVAL & MGMT  07/27/2018  . IR US GUIDE BX ASP/DRAIN  07/14/2018  . JOINT REPLACEMENT     both hips replaced   . LUMBAR LAMINECTOMY/DECOMPRESSION MICRODISCECTOMY Right 10/17/2014   Procedure: LUMBAR LAMINECTOMY/DECOMPRESSION MICRODISCECTOMY 2 LEVELS;  Surgeon: [name], MD;  Location: [facility];  Service: Neurosurgery;  Laterality: Right;  Right L45 L5S1 laminectomy and foraminotomy  . MASS EXCISION  09/13/2012   Procedure: EXCISION MASS;  Surgeon: Jamesetta So, MD;  [facility];  Service: General;  Laterality: N/A;  . ROBOTIC ASSITED PARTIAL NEPHRECTOMY Left 03/30/2019   Procedure: XI ROBOTIC ASSITED PARTIAL NEPHRECTOMY POSSIBLE RADICAL NEPHRECTOMY;  Surgeon: Ceasar Mons, MD;  [facility];  Service: Urology;  Laterality: Left;  . SHOULDER ARTHROSCOPY WITH ROTATOR CUFF REPAIR Right 04/12/2020   Procedure: right shoulder arthroscopy, debridement, mini open rotator cuff tear repair;  Surgeon: Meredith Pel, MD;  Location: [facility];  Service: Orthopedics;  Laterality: Right;  . TONSILLECTOMY    . TOTAL HIP ARTHROPLASTY Right 03/20/2015  . TOTAL HIP ARTHROPLASTY Right 03/20/2015   Procedure: TOTAL HIP ARTHROPLASTY ANTERIOR APPROACH;  Surgeon: Renette Butters, MD;  Location: [facility];  Service: Orthopedics;  Laterality: Right;  . TOTAL HIP ARTHROPLASTY Left 01/08/2016   Procedure: TOTAL HIP ARTHROPLASTY ANTERIOR APPROACH;  Surgeon: Renette Butters, MD;  Location: [facility];  Service: Orthopedics;   Laterality: Left;    There were no vitals filed for this visit.   Subjective Assessment - 07/06/20 1030    Subjective  S: I'm sore but I'm making it    Pertinent History Pt is a 59 year old male, status post RCR on 04/12/2020. Presenting with wife for evaluation, no sling. Pt referred to occupational therapy for evaluation and treatment by Dr. Meredith Pel.    Currently in Pain? Yes    Pain Score 5     Pain Location Shoulder    Pain Orientation Right    Pain Descriptors / Indicators Aching;Sore;Sharp    Pain Type Acute pain    Pain Radiating Towards N/A    Pain Onset More than a month ago    Pain Frequency Intermittent    Aggravating Factors  Movement    Pain Relieving Factors Rest and pain medication    Effect of Pain on Daily Activities Mod effect on ADLs    Multiple Pain Sites No                        OT Treatments/Exercises (OP) - 07/06/20 1036      Exercises   Exercises Shoulder      Shoulder Exercises: Supine   Horizontal ABduction AROM;10 reps    External Rotation AROM;10 reps    External Rotation Limitations Support at elbow    Internal Rotation AROM;10 reps    Internal Rotation Limitations Support at elbow    Flexion AROM;10 reps    ABduction AROM;10 reps    ABduction Limitations Support at elbow      Shoulder Exercises: Seated   Protraction AAROM;10 reps    Horizontal ABduction AAROM;15 reps    External Rotation AAROM;15 reps    Internal Rotation AAROM;15 reps    Flexion AAROM;15 reps    Abduction AAROM;15 reps    Other Seated Exercises Overhead lacing 1' with rest breaks       Shoulder Exercises: Standing   External Rotation Strengthening;10 reps;Theraband    Theraband Level (Shoulder External Rotation) Level 2 (Red)    Internal Rotation Strengthening;10 reps;Theraband    Theraband Level (Shoulder Internal Rotation) Level 2 (Red)    Extension Strengthening;Theraband;12 reps    Theraband Level (Shoulder Extension) Level 2 (Red)     Row Strengthening;12 reps;Theraband    Theraband Level (Shoulder Row) Level 2 (Red)      Shoulder Exercises: ROM/Strengthening   Ball on Wall 1' forward flexion; ~20 sec pt using LUE to assist, 1' abduction `20 seconds    Other ROM/Strengthening Exercises Large green therapy ball; overhead toss and catch 15x; three times    Other ROM/Strengthening Exercises IR/ER. Ball pass behind back; 10x both directions; tennis ball  OT Short Term Goals - 06/28/20 1058      OT SHORT TERM GOAL #1   Title Patient will be educated and independent with HEP to increase functional use of right arm during daily tasks.     Time 4    Period Weeks    Status Partially Met    Target Date 05/27/20      OT SHORT TERM GOAL #2   Title Patient will increase P/ROM to WNL in order to increase ability to get shirts on and off with less difficulty.     Time 4    Period Weeks    Status Partially Met      OT SHORT TERM GOAL #3   Title Patient will increase RUE shoulder strength to 3+/5 overall in order to be able to return to reach items at waist to chest height.    Time 4    Period Weeks    Status Partially Met      OT SHORT TERM GOAL #4   Status Partially Met             OT Long Term Goals - 06/28/20 1126      OT LONG TERM GOAL #1   Title Patient will return to highest level of functioning while using his right hand as dominant for 75% or more tasks.     Time 8    Period Weeks    Status Partially Met      OT LONG TERM GOAL #2   Title Patient will increase A/ROM to [facility] to increase ability to reach overhead when standing or in bed with less difficulty.    Time 8    Period Weeks    Status Partially Met      OT LONG TERM GOAL #3   Title Patient will increase RUE strength to 5/5 in order to return to completing normal weighted household tasks.     Time 8    Period Weeks    Status Not Met      OT LONG TERM GOAL #4   Title Patient will report a decrease in pain level of  approximately 3/10 or less to improve ability to sleep for 4 consecutive hours.    Time 8    Period Weeks    Status Not Met      OT LONG TERM GOAL #5   Title Patient will decrease fascial restrictions to min amount or less in order to increase functional mobility needed to reach above shoulder level.    Time 8    Period Weeks    Status Not Met                 Plan - 07/06/20 1318    Clinical Impression Statement A: Continued wtih AROM, and strengthening. Continued proximal strengthening with theraband, ball toss and ball on wall as well as IR/ER ball pass. Added in functional reaching, overhead lacing, verbal cues provided for form and technique.    Body Structure / Function / Physical Skills ADL;Strength;Dexterity;Pain;UE functional use;IADL;ROM;Fascial restriction;Mobility    Plan P: Continue AROM and AAROM, overhead tasks, functional reaching. Follow up on red theraband HEP    OT Home Exercise Plan 04/25/2020: table slides; 6/8: scapular A/ROM. 6/18 AAROM. 8/6 theraband exercises    Consulted and Agree with Plan of Care Patient           Patient will benefit from skilled therapeutic intervention in order to improve the following deficits and impairments:   Body  Structure / Function / Physical Skills: ADL, Strength, Dexterity, Pain, UE functional use, IADL, ROM, Fascial restriction, Mobility       Visit Diagnosis: Acute pain of right shoulder    Problem List Patient Active Problem List   Diagnosis Date Noted  . Syncope 04/18/2019  . Chronic systolic heart failure ([place])   . Pain of both hip joints   . History of renal cell carcinoma   . Renal mass 03/30/2019  . Abdominal hematoma 08/13/2018  . Radiculopathy 06/16/2018  . Acute CVA (cerebrovascular accident) ([place]) 02/01/2017  . Diabetes mellitus ([place]) 02/01/2017  . HTN (hypertension) 02/01/2017  . Primary osteoarthritis of left hip 01/08/2016  . DJD (degenerative joint disease) 03/20/2015  . Primary  osteoarthritis of right hip 02/26/2015  . Lumbar spondylosis 10/17/2014  . Lumbago 07/25/2014  . Abnormality of gait 07/25/2014  . Difficulty in walking(719.7) 07/25/2014  . TIA (transient ischemic attack) 03/09/2013  . Cocaine abuse ([place]) 03/09/2013  . Accelerated hypertension 03/09/2013  . Hyperlipidemia 03/09/2013  . Current smoker 03/09/2013    Preston Fleeting, OTR/L 07/06/2020, 1:22 PM  [facility] [address] [place], [place], [address] Phone: [phone]   Fax:  [phone]  Name: Taelyn Nemes. MRN: [id number] Date of Birth: 12-26-1960

## 2020-07-09 ENCOUNTER — Telehealth (HOSPITAL_COMMUNITY): Payer: Self-pay

## 2020-07-09 NOTE — Telephone Encounter (Signed)
Called [facility] at 3:21pm 8/9 l/m that pt needs to be here on Wed at 10:30am- this is a urgent requested a call back to confirm

## 2020-07-10 ENCOUNTER — Ambulatory Visit (HOSPITAL_COMMUNITY): Payer: Medicaid Other | Admitting: Physical Therapy

## 2020-07-11 ENCOUNTER — Ambulatory Visit (HOSPITAL_COMMUNITY): Payer: Medicaid Other

## 2020-07-12 ENCOUNTER — Encounter (HOSPITAL_COMMUNITY): Payer: Self-pay | Admitting: Occupational Therapy

## 2020-07-12 ENCOUNTER — Other Ambulatory Visit: Payer: Self-pay

## 2020-07-12 ENCOUNTER — Ambulatory Visit (HOSPITAL_COMMUNITY): Payer: Medicaid Other | Admitting: Occupational Therapy

## 2020-07-12 DIAGNOSIS — M25511 Pain in right shoulder: Secondary | ICD-10-CM

## 2020-07-12 DIAGNOSIS — M25611 Stiffness of right shoulder, not elsewhere classified: Secondary | ICD-10-CM

## 2020-07-12 DIAGNOSIS — R29898 Other symptoms and signs involving the musculoskeletal system: Secondary | ICD-10-CM

## 2020-07-12 NOTE — Therapy (Signed)
[facility] [place], [place], [address] Phone: [phone]   Fax:  [phone]  Occupational Therapy Reassessment & Treatment (recertification)  Patient Details  Name: Raymond Barrera. MRN: [id number] Date of Birth: [birth year] Referring Provider (OT): Dr. Meredith Pel    Encounter Date: 07/12/2020   OT End of Session - 07/12/20 1343    Visit Number 11    Number of Visits 16    Date for OT Re-Evaluation 08/11/20    Authorization Type Medicaid    Authorization Time Period Medicaid approved 12 visits from 7/2-8/12; requesting 4 additional visits    Authorization - Visit Number 5    Authorization - Number of Visits 12    OT Start Time 1121    OT Stop Time 1201    OT Time Calculation (min) 40 min    Activity Tolerance Patient tolerated treatment well    Behavior During Therapy [facility] for tasks assessed/performed           Past Medical History:  Diagnosis Date  . Ambulates with cane    straight - uses occasionally  . Anxiety    due to the stroke  . Arthritis   . Back pain    hx of buldging disc  . Complication of anesthesia    took a while for him to wake up after previous anesthesia  . Diabetes mellitus without complication ([place])   . Family history of adverse reaction to anesthesia    "sometimes mom has a hard time waking up"  . High cholesterol    takes Zocor daily  . Hypertension    takes Benazepril and HCTZ  daily  . Joint pain   . Joint swelling   . Memory impairment    occassional - from stroke  . Myocardial infarction ([place]) 1987  . Pneumonia    hx of-80's  . Shortness of breath dyspnea    do to pain  . Sleep apnea    never had a sleep study,but states Dr. Cindie Laroche says he has it  . Slurred speech   . Stroke ([place]) 08/2013   7 mini-strokes, last stroke 2017  . TIA (transient ischemic attack) 2014   x 7   . Urinary frequency     Past Surgical History:  Procedure Laterality Date  .  ABDOMINAL EXPOSURE N/A 06/16/2018   Procedure: ABDOMINAL EXPOSURE;  Surgeon: Rosetta Posner, MD;  Location: [facility];  Service: Vascular;  Laterality: N/A;  . ANKLE SURGERY  2008   left ankle-otif-[facility]  . ANTERIOR LUMBAR FUSION Bilateral 06/16/2018   Procedure: LUMBAR 4-5 LUMBAR 5-SACRUM 1 ANTERIOR LUMBAR INTERBODY FUSION WITH INSTRUMENTATION AND ALLOGRAFT;  Surgeon: Phylliss Bob, MD;  Location: [facility];  Service: Orthopedics;  Laterality: Bilateral;  . BACK SURGERY    . HEMATOMA EVACUATION Left 08/13/2018   Procedure: EVACUATION HEMATOMA LEFT ABDOMINAL WALL;  Surgeon: Rosetta Posner, MD;  [facility];  Service: Vascular;  Laterality: Left;  . IR RADIOLOGIST EVAL & MGMT  07/27/2018  . IR US GUIDE BX ASP/DRAIN  07/14/2018  . JOINT REPLACEMENT     both hips replaced   . LUMBAR LAMINECTOMY/DECOMPRESSION MICRODISCECTOMY Right 10/17/2014   Procedure: LUMBAR LAMINECTOMY/DECOMPRESSION MICRODISCECTOMY 2 LEVELS;  Surgeon: [name], MD;  Location: [facility];  Service: Neurosurgery;  Laterality: Right;  Right L45 L5S1 laminectomy and foraminotomy  . MASS EXCISION  09/13/2012   Procedure: EXCISION MASS;  Surgeon: Jamesetta So, MD;  [facility];  Service: General;  Laterality: N/A;  . ROBOTIC ASSITED PARTIAL NEPHRECTOMY Left 03/30/2019   Procedure: XI ROBOTIC ASSITED PARTIAL NEPHRECTOMY POSSIBLE RADICAL NEPHRECTOMY;  Surgeon: Ceasar Mons, MD;  [facility];  Service: Urology;  Laterality: Left;  . SHOULDER ARTHROSCOPY WITH ROTATOR CUFF REPAIR Right 04/12/2020   Procedure: right shoulder arthroscopy, debridement, mini open rotator cuff tear repair;  Surgeon: Meredith Pel, MD;  Location: [facility];  Service: Orthopedics;  Laterality: Right;  . TONSILLECTOMY    . TOTAL HIP ARTHROPLASTY Right 03/20/2015  . TOTAL HIP ARTHROPLASTY Right 03/20/2015   Procedure: TOTAL HIP ARTHROPLASTY ANTERIOR APPROACH;  Surgeon: Renette Butters, MD;  Location: [facility];  Service: Orthopedics;   Laterality: Right;  . TOTAL HIP ARTHROPLASTY Left 01/08/2016   Procedure: TOTAL HIP ARTHROPLASTY ANTERIOR APPROACH;  Surgeon: Renette Butters, MD;  Location: [facility];  Service: Orthopedics;  Laterality: Left;    There were no vitals filed for this visit.   Subjective Assessment - 07/12/20 1123    Subjective  S: I can reach up a little bit with that pole.    Currently in Pain? Yes    Pain Location Shoulder    Pain Orientation Right    Pain Descriptors / Indicators Aching;Sore    Pain Type Acute pain    Pain Radiating Towards N/A    Pain Onset More than a month ago    Pain Frequency Intermittent    Aggravating Factors  movement    Pain Relieving Factors rest and pain medication    Effect of Pain on Daily Activities min effect on ADLs    Multiple Pain Sites No              [facility] OT Assessment - 07/12/20 1123      Assessment   Medical Diagnosis Status post RCR      Precautions   Precautions Shoulder    Type of Shoulder Precautions progress as tolerated      Palpation   Palpation comment min facial restrictions at R shoulder region      AROM   Overall AROM Comments Assess seated, er/IR adducted    AROM Assessment Site Shoulder    Right/Left Shoulder Right    Right Shoulder Flexion 146 Degrees   138 previous   Right Shoulder ABduction 130 Degrees   125 previous   Right Shoulder Internal Rotation 90 Degrees   same as previous   Right Shoulder External Rotation 63 Degrees      PROM   Overall PROM Comments Assessed supine, er/IR adducted    PROM Assessment Site Shoulder    Right/Left Shoulder Right    Right Shoulder Flexion 147 Degrees   138 previous   Right Shoulder ABduction 150 Degrees   96 previous   Right Shoulder Internal Rotation 90 Degrees   same as previous   Right Shoulder External Rotation 40 Degrees   28 previous     Strength   Overall Strength Comments Assess seated, er/IR adducted    Strength Assessment Site Shoulder    Right/Left Shoulder Right    Right  Shoulder Flexion 4+/5   3+/5 previous   Right Shoulder ABduction 4/5   3/5 previous   Right Shoulder Internal Rotation 5/5   4-/5 previous   Right Shoulder External Rotation 4+/5   3/5 previous                   OT Treatments/Exercises (OP) - 07/12/20 1125      Exercises  Exercises Shoulder      Shoulder Exercises: Supine   Protraction PROM;5 reps    Horizontal ABduction PROM;5 reps    External Rotation PROM;5 reps    Internal Rotation PROM;5 reps    Flexion PROM;5 reps    ABduction PROM;5 reps      Shoulder Exercises: Seated   Protraction AROM;10 reps    Horizontal ABduction AROM;10 reps    External Rotation AROM;10 reps    Internal Rotation AROM;10 reps    Flexion AROM;10 reps    Abduction AROM;10 reps      Shoulder Exercises: ROM/Strengthening   X to V Arms 10X    Proximal Shoulder Strengthening, Seated 10X each, 1 rest break    Other ROM/Strengthening Exercises IR/ER. Ball pass behind back for IR and behind head for er; 10x both directions; tennis ball      Manual Therapy   Manual Therapy Myofascial release    Manual therapy comments manual therapy completed seperate from other treatment interventions    Myofascial Release myofascial release and manual techniques to right upper arm, trapezius, and scapular regions to decrease pain and fascial restrictions and increase joint ROM                  OT Education - 07/12/20 1145    Education Details A/ROM exercises    Person(s) Educated Patient;Spouse    Methods Explanation;Demonstration;Handout    Comprehension Verbalized understanding;Returned demonstration            OT Short Term Goals - 07/12/20 1139      OT SHORT TERM GOAL #1   Title Patient will be educated and independent with HEP to increase functional use of right arm during daily tasks.     Time 4    Period Weeks    Status Partially Met    Target Date 05/27/20      OT SHORT TERM GOAL #2   Title Patient will increase P/ROM to WNL  in order to increase ability to get shirts on and off with less difficulty.     Time 4    Period Weeks    Status On-going      OT SHORT TERM GOAL #3   Title Patient will increase RUE shoulder strength to 3+/5 overall in order to be able to return to reach items at waist to chest height.    Time 4    Period Weeks    Status Achieved             OT Long Term Goals - 07/12/20 1140      OT LONG TERM GOAL #1   Title Patient will return to highest level of functioning while using his right hand as dominant for 75% or more tasks.     Time 8    Period Weeks    Status On-going      OT LONG TERM GOAL #2   Title Patient will increase A/ROM to [facility] to increase ability to reach overhead when standing or in bed with less difficulty.    Time 8    Period Weeks    Status Partially Met      OT LONG TERM GOAL #3   Title Patient will increase RUE strength to 5/5 in order to return to completing normal weighted household tasks.     Time 8    Period Weeks    Status Partially Met      OT LONG TERM GOAL #4   Title Patient will report  a decrease in pain level of approximately 3/10 or less to improve ability to sleep for 4 consecutive hours.    Time 8    Period Weeks    Status On-going      OT LONG TERM GOAL #5   Title Patient will decrease fascial restrictions to min amount or less in order to increase functional mobility needed to reach above shoulder level.    Time 8    Period Weeks    Status Achieved                 Plan - 07/12/20 1343    Clinical Impression Statement A: Reassessment completed this session, pt has met 1 STG and 1 LTG, and has partially met 1 additional STG and 2 additional LTGs. Pt has made significant improvements in ROM and strength, as well as functional use of RUE during daily task completion. Pt continues to reports a high level of pain which is limiting functional use as dominant, reports he is scheduled for a nerve conduction study at the end of the month.  Pt is now able to use his RUE for dressing and bathing tasks, and assists wife up from chair using RUE. Progressed to all A/ROM this session, with pt achieving ROM WFL and demonstrating good strength. Also added proximal shoulder strengthening and x to v arms today. Verbal cuing for form and technique.    Body Structure / Function / Physical Skills ADL;Strength;Dexterity;Pain;UE functional use;IADL;ROM;Fascial restriction;Mobility    Rehab Potential Good    Clinical Decision Making Limited treatment options, no task modification necessary    Comorbidities Affecting Occupational Performance: None    Modification or Assistance to Complete Evaluation  No modification of tasks or assist necessary to complete eval    OT Frequency 1x / week    OT Duration 4 weeks    OT Treatment/Interventions Self-care/ADL training;Moist Heat;Traction;Therapeutic activities;Therapeutic exercise;Scar mobilization;Cryotherapy;Passive range of motion;Manual Therapy;Patient/family education;Ultrasound;Electrical Stimulation    Plan P: Continue with A/ROM, functional reaching, and scapular theraband tasks. Follow up on HEP    OT Home Exercise Plan 04/25/2020: table slides; 6/8: scapular A/ROM. 6/18 AAROM. 8/6 theraband exercises; 8/12: A/ROM    Consulted and Agree with Plan of Care Patient    Family Member Consulted Wife, Diane           Patient will benefit from skilled therapeutic intervention in order to improve the following deficits and impairments:   Body Structure / Function / Physical Skills: ADL, Strength, Dexterity, Pain, UE functional use, IADL, ROM, Fascial restriction, Mobility       Visit Diagnosis: Acute pain of right shoulder  Stiffness of right shoulder, not elsewhere classified  Other symptoms and signs involving the musculoskeletal system    Problem List Patient Active Problem List   Diagnosis Date Noted  . Syncope 04/18/2019  . Chronic systolic heart failure ([place])   . Pain of both hip  joints   . History of renal cell carcinoma   . Renal mass 03/30/2019  . Abdominal hematoma 08/13/2018  . Radiculopathy 06/16/2018  . Acute CVA (cerebrovascular accident) ([place]) 02/01/2017  . Diabetes mellitus ([place]) 02/01/2017  . HTN (hypertension) 02/01/2017  . Primary osteoarthritis of left hip 01/08/2016  . DJD (degenerative joint disease) 03/20/2015  . Primary osteoarthritis of right hip 02/26/2015  . Lumbar spondylosis 10/17/2014  . Lumbago 07/25/2014  . Abnormality of gait 07/25/2014  . Difficulty in walking(719.7) 07/25/2014  . TIA (transient ischemic attack) 03/09/2013  . Cocaine abuse ([place]) 03/09/2013  .  Accelerated hypertension 03/09/2013  . Hyperlipidemia 03/09/2013  . Current smoker 03/09/2013   Guadelupe Sabin, OTR/L  [phone] 07/12/2020, 1:48 PM  [facility] [address] [place], [place], [address] Phone: [phone]   Fax:  [phone]  Name: Raymond Barrera. MRN: [id number] Date of Birth: June 23, 1961

## 2020-07-12 NOTE — Patient Instructions (Signed)
Repeat all exercises 10-15 times, 1-2 times per day.  1) Shoulder Protraction    Begin with elbows by your side, slowly "punch" straight out in front of you.      2) Shoulder Flexion Standing:         Begin with arms at your side with thumbs pointed up, slowly raise both arms up and forward towards overhead.               3) Horizontal abduction/adduction  Standing:           Begin with arms straight out in front of you, bring out to the side in at "T" shape. Keep arms straight entire time.                 4) Internal & External Rotation  Standing:     Stand with elbows at the side and elbows bent 90 degrees. Move your forearms away from your body, then bring back inward toward the body.     5) Shoulder Abduction  Standing:       Lying on your back begin with your arms flat on the table next to your side. Slowly move your arms out to the side so that they go overhead, in a jumping jack or snow angel movement.     

## 2020-07-19 ENCOUNTER — Other Ambulatory Visit: Payer: Self-pay

## 2020-07-19 ENCOUNTER — Ambulatory Visit (HOSPITAL_COMMUNITY): Payer: Medicaid Other

## 2020-07-19 ENCOUNTER — Encounter (HOSPITAL_COMMUNITY): Payer: Self-pay

## 2020-07-19 DIAGNOSIS — M25511 Pain in right shoulder: Secondary | ICD-10-CM

## 2020-07-19 DIAGNOSIS — M25611 Stiffness of right shoulder, not elsewhere classified: Secondary | ICD-10-CM

## 2020-07-19 DIAGNOSIS — R29898 Other symptoms and signs involving the musculoskeletal system: Secondary | ICD-10-CM

## 2020-07-19 NOTE — Therapy (Signed)
[place], [place], [address] Phone: [phone]   Fax:  [phone]  Occupational Therapy Treatment  Patient Details  Name: Raymond Barrera. MRN: [id number] Date of Birth: 12/12/1960 Referring Provider (OT): Dr. Meredith Pel    Encounter Date: 07/19/2020   OT End of Session - 07/19/20 1329    Visit Number 10    Number of Visits 16    Date for OT Re-Evaluation 08/11/20    Authorization Type Medicaid    Authorization Time Period 4 visits approved (07/16/20-08/12/20)    Authorization - Visit Number 1    Authorization - Number of Visits 4    OT Start Time 9371    OT Stop Time 1338    OT Time Calculation (min) 33 min    Activity Tolerance Patient tolerated treatment well    Behavior During Therapy [facility] for tasks assessed/performed           Past Medical History:  Diagnosis Date  . Ambulates with cane    straight - uses occasionally  . Anxiety    due to the stroke  . Arthritis   . Back pain    hx of buldging disc  . Complication of anesthesia    took a while for him to wake up after previous anesthesia  . Diabetes mellitus without complication ([place])   . Family history of adverse reaction to anesthesia    "sometimes mom has a hard time waking up"  . High cholesterol    takes Zocor daily  . Hypertension    takes Benazepril and HCTZ  daily  . Joint pain   . Joint swelling   . Memory impairment    occassional - from stroke  . Myocardial infarction ([place]) 1987  . Pneumonia    hx of-80's  . Shortness of breath dyspnea    do to pain  . Sleep apnea    never had a sleep study,but states Dr. Cindie Laroche says he has it  . Slurred speech   . Stroke ([place]) 08/2013   7 mini-strokes, last stroke 2017  . TIA (transient ischemic attack) 2014   x 7   . Urinary frequency     Past Surgical History:  Procedure Laterality Date  . ABDOMINAL EXPOSURE N/A 06/16/2018   Procedure: ABDOMINAL EXPOSURE;  Surgeon:  Rosetta Posner, MD;  Location: [facility];  Service: Vascular;  Laterality: N/A;  . ANKLE SURGERY  2008   left ankle-otif-[facility]  . ANTERIOR LUMBAR FUSION Bilateral 06/16/2018   Procedure: LUMBAR 4-5 LUMBAR 5-SACRUM 1 ANTERIOR LUMBAR INTERBODY FUSION WITH INSTRUMENTATION AND ALLOGRAFT;  Surgeon: Phylliss Bob, MD;  Location: [facility];  Service: Orthopedics;  Laterality: Bilateral;  . BACK SURGERY    . HEMATOMA EVACUATION Left 08/13/2018   Procedure: EVACUATION HEMATOMA LEFT ABDOMINAL WALL;  Surgeon: Rosetta Posner, MD;  Location[facility];  Service: Vascular;  Laterality: Left;  . IR RADIOLOGIST EVAL & MGMT  07/27/2018  . IR US GUIDE BX ASP/DRAIN  07/14/2018  . JOINT REPLACEMENT     both hips replaced   . LUMBAR LAMINECTOMY/DECOMPRESSION MICRODISCECTOMY Right 10/17/2014   Procedure: LUMBAR LAMINECTOMY/DECOMPRESSION MICRODISCECTOMY 2 LEVELS;  Surgeon: [name], MD;  Location: [facility];  Service: Neurosurgery;  Laterality: Right;  Right L45 L5S1 laminectomy and foraminotomy  . MASS EXCISION  09/13/2012   Procedure: EXCISION MASS;  Surgeon: Jamesetta So, MD;  Location: [facility];  Service: General;  Laterality: N/A;  . ROBOTIC  ASSITED PARTIAL NEPHRECTOMY Left 03/30/2019   Procedure: XI ROBOTIC ASSITED PARTIAL NEPHRECTOMY POSSIBLE RADICAL NEPHRECTOMY;  Surgeon: Ceasar Mons, MD;  Location: [facility];  Service: Urology;  Laterality: Left;  . SHOULDER ARTHROSCOPY WITH ROTATOR CUFF REPAIR Right 04/12/2020   Procedure: right shoulder arthroscopy, debridement, mini open rotator cuff tear repair;  Surgeon: Meredith Pel, MD;  Location: [facility];  Service: Orthopedics;  Laterality: Right;  . TONSILLECTOMY    . TOTAL HIP ARTHROPLASTY Right 03/20/2015  . TOTAL HIP ARTHROPLASTY Right 03/20/2015   Procedure: TOTAL HIP ARTHROPLASTY ANTERIOR APPROACH;  Surgeon: Renette Butters, MD;  Location: [facility];  Service: Orthopedics;  Laterality: Right;  . TOTAL HIP ARTHROPLASTY Left 01/08/2016   Procedure: TOTAL HIP  ARTHROPLASTY ANTERIOR APPROACH;  Surgeon: Renette Butters, MD;  Location: [facility];  Service: Orthopedics;  Laterality: Left;    There were no vitals filed for this visit.   Subjective Assessment - 07/19/20 1309    Subjective  S: Last night the pain was terrible. It was just throbbing.    Currently in Pain? Yes    Pain Score 6     Pain Location Shoulder    Pain Orientation Right    Pain Descriptors / Indicators Aching;Sore    Pain Type Acute pain    Pain Radiating Towards N/A    Pain Onset More than a month ago    Pain Frequency Intermittent    Aggravating Factors  movement    Pain Relieving Factors rest and pain medication    Effect of Pain on Daily Activities mod effect    Multiple Pain Sites No              [facility] OT Assessment - 07/19/20 1310      Assessment   Medical Diagnosis Status post RCR      Precautions   Precautions Shoulder    Type of Shoulder Precautions progress as tolerated                    OT Treatments/Exercises (OP) - 07/19/20 1311      Exercises   Exercises Shoulder      Shoulder Exercises: Supine   External Rotation PROM;5 reps    Internal Rotation PROM;5 reps    Flexion PROM;5 reps    ABduction PROM;5 reps      Shoulder Exercises: Seated   Protraction AROM;10 reps    Horizontal ABduction AROM;10 reps    External Rotation AROM;10 reps    Internal Rotation AROM;10 reps    Flexion AROM;10 reps    Abduction AROM;10 reps      Shoulder Exercises: ROM/Strengthening   UBE (Upper Arm Bike) level 2 2' forward 2' reverse   pace: 9.0-10.0   X to V Arms 10X    Proximal Shoulder Strengthening, Seated 10X each position      Functional Reaching Activities   High Level Placed 10 cones onto top of cabinet focusing on shoulder flexion. Pt was able to place and remove.       Modalities   Modalities Moist Heat      Moist Heat Therapy   Number Minutes Moist Heat 5 Minutes    Moist Heat Location Shoulder      Manual Therapy   Manual  Therapy Other (comment)    Manual therapy comments manual therapy completed seperate from other treatment interventions    Other Manual Therapy Massage gun; small foam ball attachment used along upper arm/medial deltoid region to decrease fascial  restrictions and pain level and increase joint mobility.                     OT Short Term Goals - 07/19/20 1537      OT SHORT TERM GOAL #1   Title Patient will be educated and independent with HEP to increase functional use of right arm during daily tasks.     Time 4    Period Weeks    Status Partially Met    Target Date 05/27/20      OT SHORT TERM GOAL #2   Title Patient will increase P/ROM to WNL in order to increase ability to get shirts on and off with less difficulty.     Time 4    Period Weeks    Status On-going      OT SHORT TERM GOAL #3   Title Patient will increase RUE shoulder strength to 3+/5 overall in order to be able to return to reach items at waist to chest height.    Time 4    Period Weeks             OT Long Term Goals - 07/19/20 1538      OT LONG TERM GOAL #1   Title Patient will return to highest level of functioning while using his right hand as dominant for 75% or more tasks.     Time 8    Period Weeks    Status On-going      OT LONG TERM GOAL #2   Title Patient will increase A/ROM to [facility] to increase ability to reach overhead when standing or in bed with less difficulty.    Time 8    Period Weeks    Status Partially Met      OT LONG TERM GOAL #3   Title Patient will increase RUE strength to 5/5 in order to return to completing normal weighted household tasks.     Time 8    Period Weeks    Status Partially Met      OT LONG TERM GOAL #4   Title Patient will report a decrease in pain level of approximately 3/10 or less to improve ability to sleep for 4 consecutive hours.    Time 8    Period Weeks    Status On-going      OT LONG TERM GOAL #5   Title Patient will decrease fascial  restrictions to min amount or less in order to increase functional mobility needed to reach above shoulder level.    Time 8    Period Weeks                 Plan - 07/19/20 1506    Clinical Impression Statement A: Patient demonstrates increased muscle guarding and tension during passive stretching. Moist heat was used initially upon arrival to decrease fascial restrictions and increase joint mobility. When passive stretching became too painful, therapist used massage gun to decrease the max amount of fascial restrictions palpated at the medial deltoid region. Pt demonstrated increased tolerance to stretching post massage gun although still guarded movement.    Body Structure / Function / Physical Skills ADL;Strength;Dexterity;Pain;UE functional use;IADL;ROM;Fascial restriction;Mobility    Plan P: Complete myofascial release and do seated A/ROM from there. Do not passively stretch due to muscle guarding. Overhead lacing for functional reaching task. UBE bike for UB strength and endurance.    Consulted and Agree with Plan of Care Patient  Patient will benefit from skilled therapeutic intervention in order to improve the following deficits and impairments:   Body Structure / Function / Physical Skills: ADL, Strength, Dexterity, Pain, UE functional use, IADL, ROM, Fascial restriction, Mobility       Visit Diagnosis: Acute pain of right shoulder  Stiffness of right shoulder, not elsewhere classified  Other symptoms and signs involving the musculoskeletal system    Problem List Patient Active Problem List   Diagnosis Date Noted  . Syncope 04/18/2019  . Chronic systolic heart failure ([place])   . Pain of both hip joints   . History of renal cell carcinoma   . Renal mass 03/30/2019  . Abdominal hematoma 08/13/2018  . Radiculopathy 06/16/2018  . Acute CVA (cerebrovascular accident) ([place]) 02/01/2017  . Diabetes mellitus ([place]) 02/01/2017  . HTN (hypertension) 02/01/2017    . Primary osteoarthritis of left hip 01/08/2016  . DJD (degenerative joint disease) 03/20/2015  . Primary osteoarthritis of right hip 02/26/2015  . Lumbar spondylosis 10/17/2014  . Lumbago 07/25/2014  . Abnormality of gait 07/25/2014  . Difficulty in walking(719.7) 07/25/2014  . TIA (transient ischemic attack) 03/09/2013  . Cocaine abuse ([place]) 03/09/2013  . Accelerated hypertension 03/09/2013  . Hyperlipidemia 03/09/2013  . Current smoker 03/09/2013   Ailene Ravel, OTR/L,CBIS  [phone]  07/19/2020, 3:39 PM  [facility] [address] [place], [place], [address] Phone: [phone]   Fax:  [phone]  Name: Sebastian Lurz. MRN: [id number] Date of Birth: 10/21/1961

## 2020-07-24 ENCOUNTER — Telehealth: Payer: Self-pay | Admitting: Orthopedic Surgery

## 2020-07-24 NOTE — Progress Notes (Signed)
IC s/w patient and his wife- advised Dr Marlou Sa out of office until Monday.  They stated patient went to see Dr Primus Bravo remotely over the computer but will not see him in person on 08/08/20 and wanting to know if dr Marlou Sa willing to write refill on medication until he is seen in follow up on 08/08/20.  They reported patient would be out of pain medicationon 07/26/20 or 07/27/20 and what were they to do to help his pain until issue resolved? I did apologize and I offered for another provider to help with this but they refused stating Dr Marlou Sa is the only prescriber that can fill his medication due to his Medicaid Plan. Suggested he could try OTC meds if he is able to take them I would send this for Dr Marlou Sa to review on Monday afternoon when he returns to clinic. She sated it had to be refilled because  he is unable to be without medication for weeks.  I did let her know that ultimately it would be Dr Forbes Cellar decision.

## 2020-07-24 NOTE — Telephone Encounter (Signed)
Pts states he has an appt with a Dr.Crisp on 08/07/20 and would like to know if Dr.Dean would be willing to call in his hydrocodone until that appt

## 2020-07-24 NOTE — Telephone Encounter (Signed)
IC s/w patient and his wife- advised Dr Marlou Sa out of office until Monday.  They stated patient went to see Dr Primus Bravo remotely over the computer but will not see him in person on 08/08/20 and wanting to know if dr Marlou Sa willing to write refill on medication until he is seen in follow up on 08/08/20.  They reported patient would be out of pain medicationon 07/26/20 or 07/27/20 and what were they to do to help his pain until issue resolved? I did apologize and I offered for another provider to help with this but they refused stating Dr Marlou Sa is the only prescriber that can fill his medication due to his Medicaid Plan. Suggested he could try OTC meds if he is able to take them I would send this for Dr Marlou Sa to review on Monday afternoon when he returns to clinic. She sated it had to be refilled because  he is unable to be without medication for weeks.  I did let her know that ultimately it would be Dr Forbes Cellar decision.

## 2020-07-26 ENCOUNTER — Ambulatory Visit (HOSPITAL_COMMUNITY): Payer: Medicaid Other | Admitting: Specialist

## 2020-07-26 ENCOUNTER — Encounter (HOSPITAL_COMMUNITY): Payer: Self-pay

## 2020-07-27 ENCOUNTER — Other Ambulatory Visit: Payer: Self-pay

## 2020-07-27 ENCOUNTER — Ambulatory Visit (INDEPENDENT_AMBULATORY_CARE_PROVIDER_SITE_OTHER): Payer: Medicaid Other | Admitting: Physical Medicine and Rehabilitation

## 2020-07-27 DIAGNOSIS — R202 Paresthesia of skin: Secondary | ICD-10-CM

## 2020-07-27 NOTE — Progress Notes (Signed)
Raymond Barrera. - 59 y.o. male MRN [id number]  Date of birth: 23-Aug-1961  Office Visit Note: Visit Date: 07/27/2020 PCP: Lucia Gaskins, MD Referred by: Lucia Gaskins, MD  Subjective: Chief Complaint  Patient presents with  . Right Shoulder - Pain   HPI:  Raymond Barrera. is a 59 y.o. male who comes in today at the request of Dr. Anderson Malta for electrodiagnostic study of the Right upper extremities.  Patient is Right hand dominant.  ROS Otherwise per HPI.  Assessment & Plan: Visit Diagnoses:  1. 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 There is also mild chronic C6 radiculopathy.  Essentially mild double crush phenomena.  There is no significant electrodiagnostic evidence of any other focal nerve entrapment, brachial plexopathy or peripheral polyneuropathy.   Recommendations: 1.  Follow-up with referring physician. 2.  Continue current management of symptoms. 3.  Continue use of resting splint at night-time and as needed during the day.  Meds & Orders: No orders of the defined types were placed in this encounter.   Orders Placed This Encounter  Procedures  . NCV with EMG (electromyography)    Follow-up: Return in about 1 week (around 08/03/2020) for  Anderson Malta, M.D..   Procedures: No procedures performed  EMG & NCV Findings: Evaluation of the right median (across palm) sensory nerve showed prolonged distal peak latency (Wrist, 4.3 ms) and prolonged distal peak latency (Palm, 19.8 ms).  The right ulnar sensory nerve showed reduced amplitude (5.6 V).  All remaining nerves (as indicated in the following tables) were within normal limits.    Needle evaluation of the right biceps muscle showed slightly increased spontaneous activity.  The right deltoid muscle showed increased motor unit  amplitude and diminished recruitment.  All remaining muscles (as indicated in the following table) showed no evidence of electrical instability.    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 There is also mild chronic C6 radiculopathy.  Essentially mild double crush phenomena.  There is no significant electrodiagnostic evidence of any other focal nerve entrapment, brachial plexopathy or peripheral polyneuropathy.   Recommendations: 1.  Follow-up with referring physician. 2.  Continue current management of symptoms. 3.  Continue use of resting splint at night-time and as needed during the day.  ___________________________ Wonda Olds Board Certified, American Board of Physical Medicine and Rehabilitation    Nerve Conduction Studies Anti Sensory Summary Table   Stim Site NR Peak (ms) Norm Peak (ms) P-T Amp (V) Norm P-T Amp Site1 Site2 Delta-P (ms) Dist (cm) Vel (m/s) Norm Vel (m/s)  Right Median Acr Palm Anti Sensory (2nd Digit)  33.4C  Wrist    *4.3 <3.6 14.8 >10 Wrist Palm 15.5 0.0    Palm    *19.8 <2.0 0.6         Right Radial Anti Sensory (Base 1st Digit)  32.8C  Wrist    2.2 <3.1 15.3  Wrist Base 1st Digit 2.2 0.0    Right Ulnar Anti Sensory (5th Digit)  33.5C  Wrist    3.6 <3.7 *5.6 >15.0 Wrist 5th Digit 3.6 14.0 39 >38   Motor Summary Table   Stim Site NR Onset (ms) Norm Onset (ms) O-P Amp (mV) Norm  O-P Amp Site1 Site2 Delta-0 (ms) Dist (cm) Vel (m/s) Norm Vel (m/s)  Right Median Motor (Abd Poll Brev)  33C  Wrist    3.8 <4.2 9.1 >5 Elbow Wrist 4.9 26.0 53 >50  Elbow    8.7  8.6         Right Ulnar Motor (Abd Dig Min)  33.3C  Wrist    2.8 <4.2 7.9 >3 B Elbow Wrist 4.5 25.5 57 >53  B Elbow    7.3  8.3  A Elbow B Elbow 1.7 10.0 59 >53  A Elbow    9.0  5.9          EMG   Side Muscle Nerve Root Ins Act Fibs Psw Amp Dur  Poly Recrt Int Fraser Din Comment  Right Abd Poll Brev Median C8-T1 Nml Nml Nml Nml Nml 0 Nml Nml   Right 1stDorInt Ulnar C8-T1 Nml Nml Nml Nml Nml 0 Nml Nml   Right PronatorTeres Median C6-7 Nml Nml Nml Nml Nml 0 Nml Nml   Right Biceps Musculocut C5-6 Nml *1+ *1+ Nml Nml 0 Nml Nml   Right Deltoid Axillary C5-6 Nml Nml Nml *Incr Nml 0 *Reduced Nml     Nerve Conduction Studies Anti Sensory Left/Right Comparison   Stim Site L Lat (ms) R Lat (ms) L-R Lat (ms) L Amp (V) R Amp (V) L-R Amp (%) Site1 Site2 L Vel (m/s) R Vel (m/s) L-R Vel (m/s)  Median Acr Palm Anti Sensory (2nd Digit)  33.4C  Wrist  *4.3   14.8  Wrist Palm     Palm  *19.8   0.6        Radial Anti Sensory (Base 1st Digit)  32.8C  Wrist  2.2   15.3  Wrist Base 1st Digit     Ulnar Anti Sensory (5th Digit)  33.5C  Wrist  3.6   *5.6  Wrist 5th Digit  39    Motor Left/Right Comparison   Stim Site L Lat (ms) R Lat (ms) L-R Lat (ms) L Amp (mV) R Amp (mV) L-R Amp (%) Site1 Site2 L Vel (m/s) R Vel (m/s) L-R Vel (m/s)  Median Motor (Abd Poll Brev)  33C  Wrist  3.8   9.1  Elbow Wrist  53   Elbow  8.7   8.6        Ulnar Motor (Abd Dig Min)  33.3C  Wrist  2.8   7.9  B Elbow Wrist  57   B Elbow  7.3   8.3  A Elbow B Elbow  59   A Elbow  9.0   5.9           Waveforms:             Clinical History: No specialty comments available.     Objective:  VS:  HT:    WT:   BMI:     BP:   HR: bpm  TEMP: ( )  RESP:  Physical Exam Musculoskeletal:        General: No tenderness.     Comments: Inspection reveals no atrophy of the bilateral APB or FDI or hand intrinsics. There is no swelling, color changes, allodynia or dystrophic changes. There is 5 out of 5 strength in the bilateral wrist extension, finger abduction and long finger flexion. There is impaired sensation or dysesthesia to light touch in right radial digits.  There is a negative Hoffmann's test bilaterally.  Skin:    General: Skin is warm and dry.  Findings: No  erythema or rash.  Neurological:     General: No focal deficit present.     Mental Status: He is alert and oriented to person, place, and time.     Sensory: No sensory deficit.     Motor: No weakness or abnormal muscle tone.     Coordination: Coordination normal.     Gait: Gait normal.  Psychiatric:        Mood and Affect: Mood normal.        Behavior: Behavior normal.        Thought Content: Thought content normal.      Imaging: No results found.

## 2020-07-27 NOTE — Progress Notes (Signed)
Pt state right shoulder pain. Pt state turning his neck his pain gets worse. Pt state if he dont turn his head it makes the pain better. Pt is right hand.  Numeric Pain Rating Scale and Functional Assessment Average Pain 3   In the last MONTH (on 0-10 scale) has pain interfered with the following?  1. General activity like being  able to carry out your everyday physical activities such as walking, climbing stairs, carrying groceries, or moving a chair?  Rating(8)

## 2020-07-29 ENCOUNTER — Other Ambulatory Visit: Payer: Self-pay | Admitting: Orthopedic Surgery

## 2020-07-29 MED ORDER — HYDROCODONE-ACETAMINOPHEN 5-325 MG PO TABS: 1.0000 | ORAL_TABLET | Freq: Two times a day (BID) | ORAL | 0 refills | Status: DC | PRN

## 2020-07-29 NOTE — Telephone Encounter (Signed)
Done.  Sent 10.  Thanks he will need pain management.

## 2020-07-30 NOTE — Telephone Encounter (Signed)
I called and advised. 

## 2020-07-31 NOTE — Procedures (Signed)
EMG & NCV Findings: Evaluation of the right median (across palm) sensory nerve showed prolonged distal peak latency (Wrist, 4.3 ms) and prolonged distal peak latency (Palm, 19.8 ms).  The right ulnar sensory nerve showed reduced amplitude (5.6 V).  All remaining nerves (as indicated in the following tables) were within normal limits.    Needle evaluation of the right biceps muscle showed slightly increased spontaneous activity.  The right deltoid muscle showed increased motor unit amplitude and diminished recruitment.  All remaining muscles (as indicated in the following table) showed no evidence of electrical instability.    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 There is also mild chronic C6 radiculopathy.  Essentially mild double crush phenomena.  There is no significant electrodiagnostic evidence of any other focal nerve entrapment, brachial plexopathy or peripheral polyneuropathy.   Recommendations: 1.  Follow-up with referring physician. 2.  Continue current management of symptoms. 3.  Continue use of resting splint at night-time and as needed during the day.  ___________________________ Wonda Olds Board Certified, American Board of Physical Medicine and Rehabilitation    Nerve Conduction Studies Anti Sensory Summary Table   Stim Site NR Peak (ms) Norm Peak (ms) P-T Amp (V) Norm P-T Amp Site1 Site2 Delta-P (ms) Dist (cm) Vel (m/s) Norm Vel (m/s)  Right Median Acr Palm Anti Sensory (2nd Digit)  33.4C  Wrist    *4.3 <3.6 14.8 >10 Wrist Palm 15.5 0.0    Palm    *19.8 <2.0 0.6         Right Radial Anti Sensory (Base 1st Digit)  32.8C  Wrist    2.2 <3.1 15.3  Wrist Base 1st Digit 2.2 0.0    Right Ulnar Anti Sensory (5th Digit)  33.5C  Wrist    3.6 <3.7 *5.6 >15.0 Wrist 5th Digit 3.6 14.0 39 >38   Motor Summary Table   Stim  Site NR Onset (ms) Norm Onset (ms) O-P Amp (mV) Norm O-P Amp Site1 Site2 Delta-0 (ms) Dist (cm) Vel (m/s) Norm Vel (m/s)  Right Median Motor (Abd Poll Brev)  33C  Wrist    3.8 <4.2 9.1 >5 Elbow Wrist 4.9 26.0 53 >50  Elbow    8.7  8.6         Right Ulnar Motor (Abd Dig Min)  33.3C  Wrist    2.8 <4.2 7.9 >3 B Elbow Wrist 4.5 25.5 57 >53  B Elbow    7.3  8.3  A Elbow B Elbow 1.7 10.0 59 >53  A Elbow    9.0  5.9          EMG   Side Muscle Nerve Root Ins Act Fibs Psw Amp Dur Poly Recrt Int Fraser Din Comment  Right Abd Poll Brev Median C8-T1 Nml Nml Nml Nml Nml 0 Nml Nml   Right 1stDorInt Ulnar C8-T1 Nml Nml Nml Nml Nml 0 Nml Nml   Right PronatorTeres Median C6-7 Nml Nml Nml Nml Nml 0 Nml Nml   Right Biceps Musculocut C5-6 Nml *1+ *1+ Nml Nml 0 Nml Nml   Right Deltoid Axillary C5-6 Nml Nml Nml *Incr Nml 0 *Reduced Nml     Nerve Conduction Studies Anti Sensory Left/Right Comparison   Stim Site L Lat (ms) R Lat (ms) L-R Lat (ms) L Amp (V) R Amp (V) L-R Amp (%) Site1 Site2 L Vel (m/s) R Vel (m/s) L-R Vel (m/s)  Median Acr Palm Anti Sensory (2nd Digit)  33.4C  Wrist  *4.3   14.8  Wrist Palm     Palm  *19.8   0.6        Radial Anti Sensory (Base 1st Digit)  32.8C  Wrist  2.2   15.3  Wrist Base 1st Digit     Ulnar Anti Sensory (5th Digit)  33.5C  Wrist  3.6   *5.6  Wrist 5th Digit  39    Motor Left/Right Comparison   Stim Site L Lat (ms) R Lat (ms) L-R Lat (ms) L Amp (mV) R Amp (mV) L-R Amp (%) Site1 Site2 L Vel (m/s) R Vel (m/s) L-R Vel (m/s)  Median Motor (Abd Poll Brev)  33C  Wrist  3.8   9.1  Elbow Wrist  53   Elbow  8.7   8.6        Ulnar Motor (Abd Dig Min)  33.3C  Wrist  2.8   7.9  B Elbow Wrist  57   B Elbow  7.3   8.3  A Elbow B Elbow  59   A Elbow  9.0   5.9           Waveforms:

## 2020-08-02 ENCOUNTER — Other Ambulatory Visit: Payer: Self-pay

## 2020-08-02 ENCOUNTER — Encounter (HOSPITAL_COMMUNITY): Payer: Self-pay | Admitting: Occupational Therapy

## 2020-08-02 ENCOUNTER — Ambulatory Visit (HOSPITAL_COMMUNITY): Payer: Medicaid Other | Attending: Orthopedic Surgery | Admitting: Occupational Therapy

## 2020-08-02 DIAGNOSIS — M25511 Pain in right shoulder: Secondary | ICD-10-CM | POA: Diagnosis not present

## 2020-08-02 DIAGNOSIS — R29898 Other symptoms and signs involving the musculoskeletal system: Secondary | ICD-10-CM | POA: Diagnosis present

## 2020-08-02 DIAGNOSIS — M25611 Stiffness of right shoulder, not elsewhere classified: Secondary | ICD-10-CM | POA: Insufficient documentation

## 2020-08-02 NOTE — Therapy (Signed)
[place], [place], [address] Phone: [phone]   Fax:  [phone]  Occupational Therapy Treatment  Patient Details  Name: Raymond Barrera. MRN: [id number] Date of Birth: March 25, 1961 Referring Provider (OT): Dr. Meredith Pel    Encounter Date: 08/02/2020   OT End of Session - 08/02/20 1558    Visit Number 11    Number of Visits 16    Date for OT Re-Evaluation 08/11/20    Authorization Type Medicaid    Authorization Time Period 4 visits approved (07/16/20-08/12/20)    Authorization - Visit Number 2    Authorization - Number of Visits 4    OT Start Time 8563    OT Stop Time 1513    OT Time Calculation (min) 39 min    Activity Tolerance Patient tolerated treatment well    Behavior During Therapy [facility] for tasks assessed/performed           Past Medical History:  Diagnosis Date  . Ambulates with cane    straight - uses occasionally  . Anxiety    due to the stroke  . Arthritis   . Back pain    hx of buldging disc  . Complication of anesthesia    took a while for him to wake up after previous anesthesia  . Diabetes mellitus without complication ([place])   . Family history of adverse reaction to anesthesia    "sometimes mom has a hard time waking up"  . High cholesterol    takes Zocor daily  . Hypertension    takes Benazepril and HCTZ  daily  . Joint pain   . Joint swelling   . Memory impairment    occassional - from stroke  . Myocardial infarction ([place]) 1987  . Pneumonia    hx of-80's  . Shortness of breath dyspnea    do to pain  . Sleep apnea    never had a sleep study,but states Dr. Cindie Laroche says he has it  . Slurred speech   . Stroke ([place]) 08/2013   7 mini-strokes, last stroke 2017  . TIA (transient ischemic attack) 2014   x 7   . Urinary frequency     Past Surgical History:  Procedure Laterality Date  . ABDOMINAL EXPOSURE N/A 06/16/2018   Procedure: ABDOMINAL EXPOSURE;  Surgeon:  Rosetta Posner, MD;  Location: [place];  Service: Vascular;  Laterality: N/A;  . ANKLE SURGERY  2008   left ankle-otif-[facility]  . ANTERIOR LUMBAR FUSION Bilateral 06/16/2018   Procedure: LUMBAR 4-5 LUMBAR 5-SACRUM 1 ANTERIOR LUMBAR INTERBODY FUSION WITH INSTRUMENTATION AND ALLOGRAFT;  Surgeon: Phylliss Bob, MD;  Location: [place];  Service: Orthopedics;  Laterality: Bilateral;  . BACK SURGERY    . HEMATOMA EVACUATION Left 08/13/2018   Procedure: EVACUATION HEMATOMA LEFT ABDOMINAL WALL;  Surgeon: Rosetta Posner, MD;  [facility];  Service: Vascular;  Laterality: Left;  . IR RADIOLOGIST EVAL & MGMT  07/27/2018  . IR US GUIDE BX ASP/DRAIN  07/14/2018  . JOINT REPLACEMENT     both hips replaced   . LUMBAR LAMINECTOMY/DECOMPRESSION MICRODISCECTOMY Right 10/17/2014   Procedure: LUMBAR LAMINECTOMY/DECOMPRESSION MICRODISCECTOMY 2 LEVELS;  Surgeon: [name], MD;  Location: [place] [facility];  Service: Neurosurgery;  Laterality: Right;  Right L45 L5S1 laminectomy and foraminotomy  . MASS EXCISION  09/13/2012   Procedure: EXCISION MASS;  Surgeon: Jamesetta So, MD;  [facility];  Service: General;  Laterality: N/A;  . ROBOTIC  ASSITED PARTIAL NEPHRECTOMY Left 03/30/2019   Procedure: XI ROBOTIC ASSITED PARTIAL NEPHRECTOMY POSSIBLE RADICAL NEPHRECTOMY;  Surgeon: Ceasar Mons, MD;  [facility];  Service: Urology;  Laterality: Left;  . SHOULDER ARTHROSCOPY WITH ROTATOR CUFF REPAIR Right 04/12/2020   Procedure: right shoulder arthroscopy, debridement, mini open rotator cuff tear repair;  Surgeon: Meredith Pel, MD;  Location: [place];  Service: Orthopedics;  Laterality: Right;  . TONSILLECTOMY    . TOTAL HIP ARTHROPLASTY Right 03/20/2015  . TOTAL HIP ARTHROPLASTY Right 03/20/2015   Procedure: TOTAL HIP ARTHROPLASTY ANTERIOR APPROACH;  Surgeon: Renette Butters, MD;  Location: [place];  Service: Orthopedics;  Laterality: Right;  . TOTAL HIP ARTHROPLASTY Left 01/08/2016   Procedure: TOTAL HIP  ARTHROPLASTY ANTERIOR APPROACH;  Surgeon: Renette Butters, MD;  Location: [place];  Service: Orthopedics;  Laterality: Left;    There were no vitals filed for this visit.   Subjective Assessment - 08/02/20 1436    Subjective  S: I did some weedeating the other day and I overdid it.    Currently in Pain? Yes    Pain Score 3     Pain Location Shoulder    Pain Orientation Right    Pain Descriptors / Indicators Sore    Pain Type Acute pain    Pain Radiating Towards N/A    Pain Onset More than a month ago    Pain Frequency Intermittent    Aggravating Factors  movement    Pain Relieving Factors rest, pain medication    Effect of Pain on Daily Activities mod effect    Multiple Pain Sites No              [facility] OT Assessment - 08/02/20 1436      Assessment   Medical Diagnosis Status post RCR      Precautions   Precautions Shoulder    Type of Shoulder Precautions progress as tolerated                    OT Treatments/Exercises (OP) - 08/02/20 1437      Exercises   Exercises Shoulder      Shoulder Exercises: Supine   Protraction Strengthening;10 reps    Protraction Weight (lbs) 1    Horizontal ABduction Strengthening;10 reps    Horizontal ABduction Weight (lbs) 1    External Rotation Strengthening;10 reps    External Rotation Weight (lbs) 1    Internal Rotation Strengthening;10 reps    Internal Rotation Weight (lbs) 1    Flexion Strengthening;10 reps    Shoulder Flexion Weight (lbs) 1    ABduction AROM;10 reps    Shoulder ABduction Weight (lbs) --      Shoulder Exercises: Seated   Protraction AROM;12 reps    Horizontal ABduction AROM;12 reps    External Rotation AROM;12 reps    Internal Rotation AROM;12 reps    Flexion AROM;12 reps    Abduction AROM;12 reps      Shoulder Exercises: Standing   Extension Theraband;12 reps    Theraband Level (Shoulder Extension) Level 2 (Red)    Row Theraband;12 reps    Theraband Level (Shoulder Row) Level 2 (Red)     Retraction Theraband;12 reps    Theraband Level (Shoulder Retraction) Level 2 (Red)      Shoulder Exercises: Therapy Ball   Other Therapy Ball Exercises green therapy ball: chest press, flexion, circles each direction, 10X each      Shoulder Exercises: ROM/Strengthening   UBE (Upper Arm  Bike) Level 1 3' reverse, pace: 5.0    X to V Arms 10X    Proximal Shoulder Strengthening, Seated 10X each, no rest breaks    Other ROM/Strengthening Exercises IR/ER. Ball pass behind back for IR and behind head for er; 10x both directions; tennis ball      Manual Therapy   Manual Therapy Myofascial release    Manual therapy comments manual therapy completed seperate from other treatment interventions    Myofascial Release myofascial release and manual techniques to right upper arm, trapezius, and scapular regions to decrease pain and fascial restrictions and increase joint ROM                    OT Short Term Goals - 07/19/20 1537      OT SHORT TERM GOAL #1   Title Patient will be educated and independent with HEP to increase functional use of right arm during daily tasks.     Time 4    Period Weeks    Status Partially Met    Target Date 05/27/20      OT SHORT TERM GOAL #2   Title Patient will increase P/ROM to WNL in order to increase ability to get shirts on and off with less difficulty.     Time 4    Period Weeks    Status On-going      OT SHORT TERM GOAL #3   Title Patient will increase RUE shoulder strength to 3+/5 overall in order to be able to return to reach items at waist to chest height.    Time 4    Period Weeks             OT Long Term Goals - 07/19/20 1538      OT LONG TERM GOAL #1   Title Patient will return to highest level of functioning while using his right hand as dominant for 75% or more tasks.     Time 8    Period Weeks    Status On-going      OT LONG TERM GOAL #2   Title Patient will increase A/ROM to [facility] to increase ability to reach overhead when  standing or in bed with less difficulty.    Time 8    Period Weeks    Status Partially Met      OT LONG TERM GOAL #3   Title Patient will increase RUE strength to 5/5 in order to return to completing normal weighted household tasks.     Time 8    Period Weeks    Status Partially Met      OT LONG TERM GOAL #4   Title Patient will report a decrease in pain level of approximately 3/10 or less to improve ability to sleep for 4 consecutive hours.    Time 8    Period Weeks    Status On-going      OT LONG TERM GOAL #5   Title Patient will decrease fascial restrictions to min amount or less in order to increase functional mobility needed to reach above shoulder level.    Time 8    Period Weeks                 Plan - 08/02/20 1502    Clinical Impression Statement A: Pt reports weedeating his yard this week. Completed manual techniques to address fascial restrictions, min restrictions noted in upper arm. Progressed to strengthening in supine with exception of abduction. Continued with A/ROM  in sitting and added therapy ball strengthening today. Pt demonstrates RUE ROM WFL, requires occasional rest breaks due to limited activity tolerance. Verbal cuing for form and technique.    Body Structure / Function / Physical Skills ADL;Strength;Dexterity;Pain;UE functional use;IADL;ROM;Fascial restriction;Mobility    Plan P: Reassessment, discharge with HEP for strengthening    OT Home Exercise Plan 04/25/2020: table slides; 6/8: scapular A/ROM. 6/18 AAROM. 8/6 theraband exercises; 8/12: A/ROM    Consulted and Agree with Plan of Care Patient           Patient will benefit from skilled therapeutic intervention in order to improve the following deficits and impairments:   Body Structure / Function / Physical Skills: ADL, Strength, Dexterity, Pain, UE functional use, IADL, ROM, Fascial restriction, Mobility       Visit Diagnosis: Acute pain of right shoulder  Stiffness of right shoulder,  not elsewhere classified  Other symptoms and signs involving the musculoskeletal system    Problem List Patient Active Problem List   Diagnosis Date Noted  . Syncope 04/18/2019  . Chronic systolic heart failure ([place])   . Pain of both hip joints   . History of renal cell carcinoma   . Renal mass 03/30/2019  . Abdominal hematoma 08/13/2018  . Radiculopathy 06/16/2018  . Acute CVA (cerebrovascular accident) ([place]) 02/01/2017  . Diabetes mellitus ([place]) 02/01/2017  . HTN (hypertension) 02/01/2017  . Primary osteoarthritis of left hip 01/08/2016  . DJD (degenerative joint disease) 03/20/2015  . Primary osteoarthritis of right hip 02/26/2015  . Lumbar spondylosis 10/17/2014  . Lumbago 07/25/2014  . Abnormality of gait 07/25/2014  . Difficulty in walking(719.7) 07/25/2014  . TIA (transient ischemic attack) 03/09/2013  . Cocaine abuse ([place]) 03/09/2013  . Accelerated hypertension 03/09/2013  . Hyperlipidemia 03/09/2013  . Current smoker 03/09/2013   Guadelupe Sabin, OTR/L  [phone] 08/02/2020, 3:58 PM  [place] [address] [place], [place], [address] Phone: [phone]   Fax:  [phone]  Name: Raymond Barrera. MRN: [id number] Date of Birth: [birth year]

## 2020-08-09 ENCOUNTER — Ambulatory Visit (INDEPENDENT_AMBULATORY_CARE_PROVIDER_SITE_OTHER): Payer: Medicaid Other | Admitting: Surgical

## 2020-08-09 ENCOUNTER — Encounter: Payer: Self-pay | Admitting: Surgical

## 2020-08-09 ENCOUNTER — Other Ambulatory Visit: Payer: Self-pay

## 2020-08-09 ENCOUNTER — Ambulatory Visit (HOSPITAL_COMMUNITY): Payer: Medicaid Other

## 2020-08-09 ENCOUNTER — Encounter (HOSPITAL_COMMUNITY): Payer: Self-pay

## 2020-08-09 DIAGNOSIS — M5412 Radiculopathy, cervical region: Secondary | ICD-10-CM | POA: Diagnosis not present

## 2020-08-09 DIAGNOSIS — M25511 Pain in right shoulder: Secondary | ICD-10-CM | POA: Diagnosis not present

## 2020-08-09 DIAGNOSIS — M25611 Stiffness of right shoulder, not elsewhere classified: Secondary | ICD-10-CM

## 2020-08-09 DIAGNOSIS — R29898 Other symptoms and signs involving the musculoskeletal system: Secondary | ICD-10-CM

## 2020-08-09 NOTE — Patient Instructions (Addendum)
SCAPULAR RETRACTIONS  Move your shoulder blades back and down. Hold, relax and repeat.  Complete 10 times. 1 set    2) (Clinic) Extension / Flexion (Assist)   Face anchor, pull arms back, keeping elbow straight, and squeze shoulder blades together. Repeat 10-15 times. 1 times/day.   Copyright  VHI. All rights reserved.   3) (Home) Retraction: Row - Bilateral (Anchor)   Facing anchor, arms reaching forward, pull hands toward stomach, keeping elbows bent and at your sides and pinching shoulder blades together. Repeat 10-15 times. 1 times/day.   Copyright  VHI. All rights reserved.   Shoulder Punches  Attach a resistance band at just above shoulder height. Step away from the attachment point so that there's tension on the band throughout your motion. Punch forward, fully extending your arm, then slowly return to starting position Complete 10 reps. Complete 1 set.

## 2020-08-09 NOTE — Therapy (Signed)
[place], [place], [address] Phone: [phone]   Fax:  [phone]  Occupational Therapy Treatment Reassessment/discharge summary Patient Details  Name: Raymond Barrera. MRN: [id number] Date of Birth: 15-Aug-1961 Referring Provider (OT): Dr. Meredith Pel    Encounter Date: 08/09/2020   OT End of Session - 08/09/20 1337    Visit Number 12    Number of Visits 16    Date for OT Re-Evaluation 08/11/20    Authorization Type Medicaid    Authorization Time Period 4 visits approved (07/16/20-08/12/20)    Authorization - Visit Number 3    Authorization - Number of Visits 4    OT Start Time 1118   reassess/discharge   OT Stop Time 1200    OT Time Calculation (min) 42 min    Activity Tolerance Patient tolerated treatment well    Behavior During Therapy WFL for tasks assessed/performed           Past Medical History:  Diagnosis Date   Ambulates with cane    straight - uses occasionally   Anxiety    due to the stroke   Arthritis    Back pain    hx of buldging disc   Complication of anesthesia    took a while for him to wake up after previous anesthesia   Diabetes mellitus without complication ([place])    Family history of adverse reaction to anesthesia    "sometimes mom has a hard time waking up"   High cholesterol    takes Zocor daily   Hypertension    takes Benazepril and HCTZ  daily   Joint pain    Joint swelling    Memory impairment    occassional - from stroke   Myocardial infarction ([place]) 1987   Pneumonia    hx of-80's   Shortness of breath dyspnea    do to pain   Sleep apnea    never had a sleep study,but states Dr. Cindie Laroche says he has it   Slurred speech    Stroke ([place]) 08/2013   7 mini-strokes, last stroke 2017   TIA (transient ischemic attack) 2014   x 7    Urinary frequency     Past Surgical History:  Procedure Laterality Date   ABDOMINAL EXPOSURE N/A  06/16/2018   Procedure: ABDOMINAL EXPOSURE;  Surgeon: Rosetta Posner, MD;  Location: [place];  Service: Vascular;  Laterality: N/A;   ANKLE SURGERY  2008   left ankle-otif-[facility]   ANTERIOR LUMBAR FUSION Bilateral 06/16/2018   Procedure: LUMBAR 4-5 LUMBAR 5-SACRUM 1 ANTERIOR LUMBAR INTERBODY FUSION WITH INSTRUMENTATION AND ALLOGRAFT;  Surgeon: Phylliss Bob, MD;  Location: [place];  Service: Orthopedics;  Laterality: Bilateral;   BACK SURGERY     HEMATOMA EVACUATION Left 08/13/2018   Procedure: EVACUATION HEMATOMA LEFT ABDOMINAL WALL;  Surgeon: Rosetta Posner, MD;  [facility];  Service: Vascular;  Laterality: Left;   IR RADIOLOGIST EVAL & MGMT  07/27/2018   IR US GUIDE BX ASP/DRAIN  07/14/2018   JOINT REPLACEMENT     both hips replaced    LUMBAR LAMINECTOMY/DECOMPRESSION MICRODISCECTOMY Right 10/17/2014   Procedure: LUMBAR LAMINECTOMY/DECOMPRESSION MICRODISCECTOMY 2 LEVELS;  Surgeon: [name], MD;  Location: [place] [facility];  Service: Neurosurgery;  Laterality: Right;  Right L45 L5S1 laminectomy and foraminotomy   MASS EXCISION  09/13/2012   Procedure: EXCISION MASS;  Surgeon: Jamesetta So, MD;  [facility];  Service: General;  Laterality: N/A;  ROBOTIC ASSITED PARTIAL NEPHRECTOMY Left 03/30/2019   Procedure: XI ROBOTIC ASSITED PARTIAL NEPHRECTOMY POSSIBLE RADICAL NEPHRECTOMY;  Surgeon: Ceasar Mons, MD;  [facility];  Service: Urology;  Laterality: Left;   SHOULDER ARTHROSCOPY WITH ROTATOR CUFF REPAIR Right 04/12/2020   Procedure: right shoulder arthroscopy, debridement, mini open rotator cuff tear repair;  Surgeon: Meredith Pel, MD;  Location: [place];  Service: Orthopedics;  Laterality: Right;   TONSILLECTOMY     TOTAL HIP ARTHROPLASTY Right 03/20/2015   TOTAL HIP ARTHROPLASTY Right 03/20/2015   Procedure: TOTAL HIP ARTHROPLASTY ANTERIOR APPROACH;  Surgeon: Renette Butters, MD;  Location: [place] [facility];  Service: Orthopedics;  Laterality: Right;   TOTAL  HIP ARTHROPLASTY Left 01/08/2016   Procedure: TOTAL HIP ARTHROPLASTY ANTERIOR APPROACH;  Surgeon: Renette Butters, MD;  Location: [facility];  Service: Orthopedics;  Laterality: Left;    There were no vitals filed for this visit.   Subjective Assessment - 08/09/20 1121    Subjective  S: I'm having a hard time today. My shoulder hurts. My neck hurts.    Currently in Pain? Yes    Pain Score 9     Pain Location Shoulder    Pain Orientation Right    Pain Descriptors / Indicators Sore    Pain Type Acute pain    Pain Radiating Towards N/A    Pain Onset More than a month ago    Pain Frequency Constant    Aggravating Factors  wrong movement    Pain Relieving Factors pain medication - short term 2.5-3 hrs    Effect of Pain on Daily Activities severe effect    Multiple Pain Sites No              [facility] OT Assessment - 08/09/20 1123      Assessment   Medical Diagnosis Status post RCR      Precautions   Precautions Shoulder    Type of Shoulder Precautions progress as tolerated      ROM / Strength   AROM / PROM / Strength AROM;PROM;Strength      Palpation   Palpation comment moderate fascial restrictions noted in the right upper arm, trapezius, and scapularis region.      AROM   Overall AROM Comments Assess seated, er/IR adducted    AROM Assessment Site Shoulder    Right/Left Shoulder Right    Right Shoulder Flexion 150 Degrees   previous: 146   Right Shoulder ABduction 145 Degrees   previous: 130   Right Shoulder Internal Rotation 90 Degrees   previous: same   Right Shoulder External Rotation 66 Degrees   previous: 63     PROM   Overall PROM Comments Assessed supine, er/IR adducted    PROM Assessment Site Shoulder    Right/Left Shoulder Right    Right Shoulder Flexion 150 Degrees   previous: 147   Right Shoulder ABduction 172 Degrees   previous: 150   Right Shoulder Internal Rotation 90 Degrees   previous: same   Right Shoulder External Rotation 60 Degrees   previous: 40      Strength   Overall Strength Comments Assess seated, er/IR adducted    Strength Assessment Site Shoulder    Right/Left Shoulder Right    Right Shoulder Flexion 5/5   previous: 4+/5   Right Shoulder ABduction 5/5   previous: 4/5   Right Shoulder Internal Rotation 5/5   previous: same   Right Shoulder External Rotation 4-/5   previous: 4+/5  OT Treatments/Exercises (OP) - 08/09/20 1331      Exercises   Exercises Shoulder      Manual Therapy   Manual Therapy Taping    Manual therapy comments manual therapy completed seperate from other treatment interventions    Kinesiotex --   pain                 OT Education - 08/09/20 1333    Education Details Discussed progress in therapy. Reviewed HEP and updated: shoulder punches, row, extension. Scapular squeezes. Dicussed pain level and monitoring. Use of heat for pain management and address fascial restrictions. [place] information handout with precautions, use, and removal tips    Person(s) Educated Patient    Methods Explanation;Handout    Comprehension Verbalized understanding            OT Short Term Goals - 08/09/20 1341      OT SHORT TERM GOAL #1   Title Patient will be educated and independent with HEP to increase functional use of right arm during daily tasks.     Time 4    Period Weeks    Status Achieved    Target Date 05/27/20      OT SHORT TERM GOAL #2   Title Patient will increase P/ROM to WNL in order to increase ability to get shirts on and off with less difficulty.     Time 4    Period Weeks    Status Achieved      OT SHORT TERM GOAL #3   Title Patient will increase RUE shoulder strength to 3+/5 overall in order to be able to return to reach items at waist to chest height.    Time 4    Period Weeks             OT Long Term Goals - 08/09/20 1342      OT LONG TERM GOAL #1   Title Patient will return to highest level of functioning while using his right hand as  dominant for 75% or more tasks.     Time 8    Period Weeks    Status Not Met      OT LONG TERM GOAL #2   Title Patient will increase A/ROM to [facility] to increase ability to reach overhead when standing or in bed with less difficulty.    Time 8    Period Weeks    Status Achieved      OT LONG TERM GOAL #3   Title Patient will increase RUE strength to 5/5 in order to return to completing normal weighted household tasks.     Time 8    Period Weeks    Status Partially Met      OT LONG TERM GOAL #4   Title Patient will report a decrease in pain level of approximately 3/10 or less to improve ability to sleep for 4 consecutive hours.    Time 8    Period Weeks    Status Not Met      OT LONG TERM GOAL #5   Title Patient will decrease fascial restrictions to min amount or less in order to increase functional mobility needed to reach above shoulder level.    Time 8    Period Weeks                 Plan - 08/09/20 1338    Clinical Impression Statement A: Kinisotape applied to right shoulder to provide joint suport and to decrease pain level  during functional use. Reassessment completed this date. patient did show slight improvement with A/ROM and P/ROM measurements. shoulder strength is functional. Pain continues to be present and fluctuate daily. All short term goals have been met. 2/5 LTGs have been met with an additional goal partially met.    Body Structure / Function / Physical Skills ADL;Strength;Dexterity;Pain;UE functional use;IADL;ROM;Fascial restriction;Mobility    Plan P: D/C from OT services with HEP.    Consulted and Agree with Plan of Care Patient           Patient will benefit from skilled therapeutic intervention in order to improve the following deficits and impairments:   Body Structure / Function / Physical Skills: ADL, Strength, Dexterity, Pain, UE functional use, IADL, ROM, Fascial restriction, Mobility       Visit Diagnosis: Acute pain of right  shoulder  Stiffness of right shoulder, not elsewhere classified  Other symptoms and signs involving the musculoskeletal system    Problem List Patient Active Problem List   Diagnosis Date Noted   Syncope 48/25/0037   Chronic systolic heart failure ([facility])    Pain of both hip joints    History of renal cell carcinoma    Renal mass 03/30/2019   Abdominal hematoma 08/13/2018   Radiculopathy 06/16/2018   Acute CVA (cerebrovascular accident) ([place]) 02/01/2017   Diabetes mellitus ([place]) 02/01/2017   HTN (hypertension) 02/01/2017   Primary osteoarthritis of left hip 01/08/2016   DJD (degenerative joint disease) 03/20/2015   Primary osteoarthritis of right hip 02/26/2015   Lumbar spondylosis 10/17/2014   Lumbago 07/25/2014   Abnormality of gait 07/25/2014   Difficulty in walking(719.7) 07/25/2014   TIA (transient ischemic attack) 03/09/2013   Cocaine abuse ([place]) 03/09/2013   Accelerated hypertension 03/09/2013   Hyperlipidemia 03/09/2013   Current smoker 03/09/2013   OCCUPATIONAL THERAPY DISCHARGE SUMMARY  Visits from Start of Care: 12  Current functional level related to goals / functional outcomes: See above   Remaining deficits: Pain continues to be elevated at times and fluctuates daily.     Education / Equipment: Shoulder scapular strengthening with red band, pain management techniques/modalities Plan: Patient agrees to discharge.  Patient goals were partially met. Patient is being discharged due to meeting the stated rehab goals.  ?????         Ailene Ravel, OTR/L,CBIS  [phone]  08/09/2020, 2:00 PM  [place] [address] [place], [place], [address] Phone: [phone]   Fax:  [phone]  Name: Raymond Barrera. MRN: [id number] Date of Birth: 27-Jan-1961

## 2020-08-22 ENCOUNTER — Encounter: Payer: Self-pay | Admitting: Surgical

## 2020-08-22 NOTE — Progress Notes (Signed)
Office Visit Note   Patient: Raymond Barrera.           Date of Birth: Jul 23, 1961           MRN: [id number] Visit Date: 08/09/2020 Requested by: Lucia Gaskins, MD [address] [place],  [place] [address] PCP: Lucia Gaskins, MD  Subjective: Chief Complaint  Patient presents with  . Right Hand - Numbness    HPI: Raymond Barrera. is a 59 y.o. male who presents to the office complaining of right hand and right arm pain.  Patient complains of worsening radicular pain down the entirety of his right arm.  He has numbness and tingling that affects his right hand in all 5 fingers multiple times per day.  Pain is waking him up at night.  He feels like he is losing muscle mass compared with the contralateral side.  He feels like he is dropping utensils as well.  He does have some neck pain as well but the main complaint is the pain traveling down his arm.  He has no history of recent injury.  He also notes that he has been increasingly clumsy in the last month to the point that his wife has noticed him stumbling and just being generally unsteady on his feet compared with several months prior.  Patient had a nerve conduction study done on 07/27/2020 that revealed mild right median nerve entrapment at the wrist with mild chronic C6 radiculopathy..                ROS: All systems reviewed are negative as they relate to the chief complaint within the history of present illness.  Patient denies fevers or chills.  Assessment & Plan: Visit Diagnoses:  1. Radiculopathy, cervical region     Plan: Patient is a 59 year old male complaining of radicular right arm pain.  He has associated numbness and tingling as well as subjective weakness of the right arm.  Symptoms are worsening and he has now developed clumsiness with his gait is noticeable but both him and his wife.  Pain is waking him up at night.  He has had nerve conduction study with results as detailed above in HPI.  Previous radiographs  earlier this year demonstrated loss of lordosis with lateral, anterior osteophytes and disc space narrowing.  Ordered MRI of the cervical spine for further evaluation of right radiculopathy and possible myelopathy.  Follow-up after MRI for result review.  Follow-Up Instructions: No follow-ups on file.   Orders:  Orders Placed This Encounter  Procedures  . MR Cervical Spine w/o contrast   No orders of the defined types were placed in this encounter.     Procedures: No procedures performed   Clinical Data: No additional findings.  Objective: Vital Signs: There were no vitals taken for this visit.  Physical Exam:  Constitutional: Patient appears well-developed HEENT:  Head: Normocephalic Eyes:EOM are normal Neck: Normal range of motion Cardiovascular: Normal rate Pulmonary/chest: Effort normal Neurologic: Patient is alert Skin: Skin is warm Psychiatric: Patient has normal mood and affect  Ortho Exam: Ortho exam demonstrates decreased sensation throughout the entirety of the right arm compared to contralateral side.  5/5 motor strength of the bilateral grip strength, finger abduction, pronation/supination, bicep, tricep, deltoid.  No weakness of the rotator cuff on exam.  No crepitus felt with passive range of motion of the shoulder on the right side.  Negative Hoffmann sign.  No hypo or hyperreflexia.  Tenderness along the axial cervical spine  is present with tenderness along the paraspinal musculature on the right side as well.  Specialty Comments:  No specialty comments available.  Imaging: No results found.   PMFS History: Patient Active Problem List   Diagnosis Date Noted  . Syncope 04/18/2019  . Chronic systolic heart failure ([place])   . Pain of both hip joints   . History of renal cell carcinoma   . Renal mass 03/30/2019  . Abdominal hematoma 08/13/2018  . Radiculopathy 06/16/2018  . Acute CVA (cerebrovascular accident) ([facility]) 02/01/2017  . Diabetes mellitus  ([place]) 02/01/2017  . HTN (hypertension) 02/01/2017  . Primary osteoarthritis of left hip 01/08/2016  . DJD (degenerative joint disease) 03/20/2015  . Primary osteoarthritis of right hip 02/26/2015  . Lumbar spondylosis 10/17/2014  . Lumbago 07/25/2014  . Abnormality of gait 07/25/2014  . Difficulty in walking(719.7) 07/25/2014  . TIA (transient ischemic attack) 03/09/2013  . Cocaine abuse ([facility]) 03/09/2013  . Accelerated hypertension 03/09/2013  . Hyperlipidemia 03/09/2013  . Current smoker 03/09/2013   Past Medical History:  Diagnosis Date  . Ambulates with cane    straight - uses occasionally  . Anxiety    due to the stroke  . Arthritis   . Back pain    hx of buldging disc  . Complication of anesthesia    took a while for him to wake up after previous anesthesia  . Diabetes mellitus without complication ([place])   . Family history of adverse reaction to anesthesia    "sometimes mom has a hard time waking up"  . High cholesterol    takes Zocor daily  . Hypertension    takes Benazepril and HCTZ  daily  . Joint pain   . Joint swelling   . Memory impairment    occassional - from stroke  . Myocardial infarction ([facility] [facility]) 1987  . Pneumonia    hx of-80's  . Shortness of breath dyspnea    do to pain  . Sleep apnea    never had a sleep study,but states Dr. Cindie Laroche says he has it  . Slurred speech   . Stroke ([facility]) 08/2013   7 mini-strokes, last stroke 2017  . TIA (transient ischemic attack) 2014   x 7   . Urinary frequency     Family History  Problem Relation Age of Onset  . Heart disease Father     Past Surgical History:  Procedure Laterality Date  . ABDOMINAL EXPOSURE N/A 06/16/2018   Procedure: ABDOMINAL EXPOSURE;  Surgeon: Rosetta Posner, MD;  Location: [facility];  Service: Vascular;  Laterality: N/A;  . ANKLE SURGERY  2008   left ankle-otif-[facility]  . ANTERIOR LUMBAR FUSION Bilateral 06/16/2018   Procedure: LUMBAR 4-5 LUMBAR 5-SACRUM 1 ANTERIOR LUMBAR INTERBODY FUSION WITH  INSTRUMENTATION AND ALLOGRAFT;  Surgeon: Phylliss Bob, MD;  Location: [facility];  Service: Orthopedics;  Laterality: Bilateral;  . BACK SURGERY    . HEMATOMA EVACUATION Left 08/13/2018   Procedure: EVACUATION HEMATOMA LEFT ABDOMINAL WALL;  Surgeon: Rosetta Posner, MD;  [facility];  Service: Vascular;  Laterality: Left;  . IR RADIOLOGIST EVAL & MGMT  07/27/2018  . IR US GUIDE BX ASP/DRAIN  07/14/2018  . JOINT REPLACEMENT     both hips replaced   . LUMBAR LAMINECTOMY/DECOMPRESSION MICRODISCECTOMY Right 10/17/2014   Procedure: LUMBAR LAMINECTOMY/DECOMPRESSION MICRODISCECTOMY 2 LEVELS;  Surgeon: [name], MD;  Location: [facility];  Service: Neurosurgery;  Laterality: Right;  Right L45 L5S1 laminectomy and foraminotomy  . MASS EXCISION  09/13/2012  Procedure: EXCISION MASS;  Surgeon: Jamesetta So, MD;  [facility];  Service: General;  Laterality: N/A;  . ROBOTIC ASSITED PARTIAL NEPHRECTOMY Left 03/30/2019   Procedure: XI ROBOTIC ASSITED PARTIAL NEPHRECTOMY POSSIBLE RADICAL NEPHRECTOMY;  Surgeon: Ceasar Mons, MD;  [facility];  Service: Urology;  Laterality: Left;  . SHOULDER ARTHROSCOPY WITH ROTATOR CUFF REPAIR Right 04/12/2020   Procedure: right shoulder arthroscopy, debridement, mini open rotator cuff tear repair;  Surgeon: Meredith Pel, MD;  Location: [facility];  Service: Orthopedics;  Laterality: Right;  . TONSILLECTOMY    . TOTAL HIP ARTHROPLASTY Right 03/20/2015  . TOTAL HIP ARTHROPLASTY Right 03/20/2015   Procedure: TOTAL HIP ARTHROPLASTY ANTERIOR APPROACH;  Surgeon: Renette Butters, MD;  Location: [facility];  Service: Orthopedics;  Laterality: Right;  . TOTAL HIP ARTHROPLASTY Left 01/08/2016   Procedure: TOTAL HIP ARTHROPLASTY ANTERIOR APPROACH;  Surgeon: Renette Butters, MD;  Location: [facility];  Service: Orthopedics;  Laterality: Left;   Social History   Occupational History  . Not on file  Tobacco Use  . Smoking status: Current Some Day Smoker     Packs/day: 0.25    Years: 47.00    Pack years: 11.75    Types: Cigarettes  . Smokeless tobacco: Never Used  Vaping Use  . Vaping Use: Never used  Substance and Sexual Activity  . Alcohol use: Not Currently    Comment: quit 2012  . Drug use: Not Currently    Types: Cocaine    Comment: many yrs ago., last time- late 2016  . Sexual activity: Yes

## 2020-09-01 ENCOUNTER — Other Ambulatory Visit: Payer: Self-pay

## 2020-09-01 ENCOUNTER — Ambulatory Visit
Admission: RE | Admit: 2020-09-01 | Discharge: 2020-09-01 | Disposition: A | Discharge status: Home / Self Care | Payer: Medicaid Other | Source: Ambulatory Visit | Attending: Surgical | Admitting: Surgical

## 2020-09-01 DIAGNOSIS — M5412 Radiculopathy, cervical region: Secondary | ICD-10-CM

## 2020-09-12 ENCOUNTER — Ambulatory Visit: Payer: Medicaid Other | Admitting: Orthopedic Surgery

## 2020-09-17 ENCOUNTER — Other Ambulatory Visit: Payer: Self-pay | Admitting: *Deleted

## 2020-09-17 ENCOUNTER — Other Ambulatory Visit: Payer: Medicaid Other

## 2020-09-17 DIAGNOSIS — Z20822 Contact with and (suspected) exposure to covid-19: Secondary | ICD-10-CM

## 2020-09-19 LAB — SARS-COV-2, NAA 2 DAY TAT

## 2020-09-19 LAB — NOVEL CORONAVIRUS, NAA: SARS-CoV-2, NAA: NOT DETECTED

## 2020-09-19 LAB — SPECIMEN STATUS REPORT

## 2020-10-07 ENCOUNTER — Encounter (HOSPITAL_COMMUNITY): Payer: Self-pay

## 2020-10-07 ENCOUNTER — Emergency Department (HOSPITAL_COMMUNITY): Payer: Medicaid Other

## 2020-10-07 ENCOUNTER — Other Ambulatory Visit: Payer: Self-pay

## 2020-10-07 ENCOUNTER — Inpatient Hospital Stay (HOSPITAL_COMMUNITY)
Admission: EM | Admit: 2020-10-07 | Discharge: 2020-10-09 | DRG: 065 | Disposition: A | Discharge status: Home / Self Care | Payer: Medicaid Other | Attending: Internal Medicine | Admitting: Internal Medicine

## 2020-10-07 DIAGNOSIS — Z7982 Long term (current) use of aspirin: Secondary | ICD-10-CM

## 2020-10-07 DIAGNOSIS — Z905 Acquired absence of kidney: Secondary | ICD-10-CM

## 2020-10-07 DIAGNOSIS — R471 Dysarthria and anarthria: Secondary | ICD-10-CM | POA: Diagnosis present

## 2020-10-07 DIAGNOSIS — E1165 Type 2 diabetes mellitus with hyperglycemia: Secondary | ICD-10-CM | POA: Diagnosis present

## 2020-10-07 DIAGNOSIS — H538 Other visual disturbances: Secondary | ICD-10-CM | POA: Diagnosis present

## 2020-10-07 DIAGNOSIS — G9349 Other encephalopathy: Secondary | ICD-10-CM | POA: Diagnosis present

## 2020-10-07 DIAGNOSIS — I252 Old myocardial infarction: Secondary | ICD-10-CM

## 2020-10-07 DIAGNOSIS — E1169 Type 2 diabetes mellitus with other specified complication: Secondary | ICD-10-CM

## 2020-10-07 DIAGNOSIS — I639 Cerebral infarction, unspecified: Secondary | ICD-10-CM | POA: Diagnosis not present

## 2020-10-07 DIAGNOSIS — Z7984 Long term (current) use of oral hypoglycemic drugs: Secondary | ICD-10-CM

## 2020-10-07 DIAGNOSIS — Z20822 Contact with and (suspected) exposure to covid-19: Secondary | ICD-10-CM | POA: Diagnosis present

## 2020-10-07 DIAGNOSIS — E785 Hyperlipidemia, unspecified: Secondary | ICD-10-CM

## 2020-10-07 DIAGNOSIS — Z96643 Presence of artificial hip joint, bilateral: Secondary | ICD-10-CM | POA: Diagnosis present

## 2020-10-07 DIAGNOSIS — Z85528 Personal history of other malignant neoplasm of kidney: Secondary | ICD-10-CM

## 2020-10-07 DIAGNOSIS — I82409 Acute embolism and thrombosis of unspecified deep veins of unspecified lower extremity: Secondary | ICD-10-CM

## 2020-10-07 DIAGNOSIS — I69351 Hemiplegia and hemiparesis following cerebral infarction affecting right dominant side: Secondary | ICD-10-CM

## 2020-10-07 DIAGNOSIS — R29703 NIHSS score 3: Secondary | ICD-10-CM | POA: Diagnosis present

## 2020-10-07 DIAGNOSIS — F17210 Nicotine dependence, cigarettes, uncomplicated: Secondary | ICD-10-CM | POA: Diagnosis present

## 2020-10-07 DIAGNOSIS — I6612 Occlusion and stenosis of left anterior cerebral artery: Secondary | ICD-10-CM | POA: Diagnosis present

## 2020-10-07 DIAGNOSIS — Z981 Arthrodesis status: Secondary | ICD-10-CM

## 2020-10-07 DIAGNOSIS — I129 Hypertensive chronic kidney disease with stage 1 through stage 4 chronic kidney disease, or unspecified chronic kidney disease: Secondary | ICD-10-CM | POA: Diagnosis present

## 2020-10-07 DIAGNOSIS — R262 Difficulty in walking, not elsewhere classified: Secondary | ICD-10-CM | POA: Diagnosis present

## 2020-10-07 DIAGNOSIS — F419 Anxiety disorder, unspecified: Secondary | ICD-10-CM | POA: Diagnosis present

## 2020-10-07 DIAGNOSIS — G4733 Obstructive sleep apnea (adult) (pediatric): Secondary | ICD-10-CM | POA: Diagnosis present

## 2020-10-07 DIAGNOSIS — Z8249 Family history of ischemic heart disease and other diseases of the circulatory system: Secondary | ICD-10-CM

## 2020-10-07 DIAGNOSIS — E1122 Type 2 diabetes mellitus with diabetic chronic kidney disease: Secondary | ICD-10-CM | POA: Diagnosis present

## 2020-10-07 DIAGNOSIS — E7800 Pure hypercholesterolemia, unspecified: Secondary | ICD-10-CM | POA: Diagnosis present

## 2020-10-07 DIAGNOSIS — R531 Weakness: Secondary | ICD-10-CM | POA: Diagnosis present

## 2020-10-07 DIAGNOSIS — Z9109 Other allergy status, other than to drugs and biological substances: Secondary | ICD-10-CM

## 2020-10-07 DIAGNOSIS — G9389 Other specified disorders of brain: Secondary | ICD-10-CM | POA: Diagnosis present

## 2020-10-07 DIAGNOSIS — Z79899 Other long term (current) drug therapy: Secondary | ICD-10-CM

## 2020-10-07 DIAGNOSIS — R29898 Other symptoms and signs involving the musculoskeletal system: Secondary | ICD-10-CM

## 2020-10-07 DIAGNOSIS — N1832 Chronic kidney disease, stage 3b: Secondary | ICD-10-CM | POA: Diagnosis present

## 2020-10-07 HISTORY — DX: Cerebral infarction, unspecified: I63.9

## 2020-10-07 LAB — RESPIRATORY PANEL BY RT PCR (FLU A&B, COVID)
Influenza A by PCR: NEGATIVE
Influenza B by PCR: NEGATIVE
SARS Coronavirus 2 by RT PCR: NEGATIVE

## 2020-10-07 LAB — DIFFERENTIAL
Abs Immature Granulocytes: 0.01 10*3/uL (ref 0.00–0.07)
Basophils Absolute: 0 10*3/uL (ref 0.0–0.1)
Basophils Relative: 0 %
Eosinophils Absolute: 0.1 10*3/uL (ref 0.0–0.5)
Eosinophils Relative: 2 %
Immature Granulocytes: 0 %
Lymphocytes Relative: 26 %
Lymphs Abs: 1.6 10*3/uL (ref 0.7–4.0)
Monocytes Absolute: 0.4 10*3/uL (ref 0.1–1.0)
Monocytes Relative: 6 %
Neutro Abs: 4 10*3/uL (ref 1.7–7.7)
Neutrophils Relative %: 66 %

## 2020-10-07 LAB — RAPID URINE DRUG SCREEN, HOSP PERFORMED
Amphetamines: NOT DETECTED
Barbiturates: NOT DETECTED
Benzodiazepines: NOT DETECTED
Cocaine: POSITIVE — AB
Opiates: NOT DETECTED
Tetrahydrocannabinol: NOT DETECTED

## 2020-10-07 LAB — CBC
HCT: 40.6 % (ref 39.0–52.0)
Hemoglobin: 14 g/dL (ref 13.0–17.0)
MCH: 31 pg (ref 26.0–34.0)
MCHC: 34.5 g/dL (ref 30.0–36.0)
MCV: 89.8 fL (ref 80.0–100.0)
Platelets: 256 10*3/uL (ref 150–400)
RBC: 4.52 MIL/uL (ref 4.22–5.81)
RDW: 13.9 % (ref 11.5–15.5)
WBC: 6.1 10*3/uL (ref 4.0–10.5)
nRBC: 0 % (ref 0.0–0.2)

## 2020-10-07 LAB — URINALYSIS, ROUTINE W REFLEX MICROSCOPIC
Bilirubin Urine: NEGATIVE
Glucose, UA: NEGATIVE mg/dL
Hgb urine dipstick: NEGATIVE
Ketones, ur: NEGATIVE mg/dL
Leukocytes,Ua: NEGATIVE
Nitrite: NEGATIVE
Protein, ur: 100 mg/dL — AB
Specific Gravity, Urine: 1.019 (ref 1.005–1.030)
pH: 5 (ref 5.0–8.0)

## 2020-10-07 LAB — I-STAT CHEM 8, ED
BUN: 26 mg/dL — ABNORMAL HIGH (ref 8–23)
Calcium, Ion: 1.33 mmol/L (ref 1.15–1.40)
Chloride: 104 mmol/L (ref 98–111)
Creatinine, Ser: 2.2 mg/dL — ABNORMAL HIGH (ref 0.61–1.24)
Glucose, Bld: 160 mg/dL — ABNORMAL HIGH (ref 70–99)
HCT: 39 % (ref 39.0–52.0)
Hemoglobin: 13.3 g/dL (ref 13.0–17.0)
Potassium: 4.3 mmol/L (ref 3.5–5.1)
Sodium: 141 mmol/L (ref 135–145)
TCO2: 25 mmol/L (ref 22–32)

## 2020-10-07 LAB — COMPREHENSIVE METABOLIC PANEL
ALT: 25 U/L (ref 0–44)
AST: 20 U/L (ref 15–41)
Albumin: 4.2 g/dL (ref 3.5–5.0)
Alkaline Phosphatase: 79 U/L (ref 38–126)
Anion gap: 9 (ref 5–15)
BUN: 25 mg/dL — ABNORMAL HIGH (ref 8–23)
CO2: 25 mmol/L (ref 22–32)
Calcium: 10.1 mg/dL (ref 8.9–10.3)
Chloride: 102 mmol/L (ref 98–111)
Creatinine, Ser: 2.06 mg/dL — ABNORMAL HIGH (ref 0.61–1.24)
GFR, Estimated: 36 mL/min — ABNORMAL LOW (ref >60)
Glucose, Bld: 169 mg/dL — ABNORMAL HIGH (ref 70–99)
Potassium: 4.2 mmol/L (ref 3.5–5.1)
Sodium: 136 mmol/L (ref 135–145)
Total Bilirubin: 0.6 mg/dL (ref 0.3–1.2)
Total Protein: 7.9 g/dL (ref 6.5–8.1)

## 2020-10-07 LAB — PROTIME-INR
INR: 1 (ref 0.8–1.2)
Prothrombin Time: 13 seconds (ref 11.4–15.2)

## 2020-10-07 LAB — CBG MONITORING, ED
Glucose-Capillary: 173 mg/dL — ABNORMAL HIGH (ref 70–99)
Glucose-Capillary: 92 mg/dL (ref 70–99)
Glucose-Capillary: 119 mg/dL — ABNORMAL HIGH (ref 70–99)

## 2020-10-07 LAB — GLUCOSE, CAPILLARY: Glucose-Capillary: 116 mg/dL — ABNORMAL HIGH (ref 70–99)

## 2020-10-07 LAB — APTT: aPTT: 31 seconds (ref 24–36)

## 2020-10-07 LAB — ETHANOL: Alcohol, Ethyl (B): <10 mg/dL (ref <10)

## 2020-10-07 LAB — HIV ANTIBODY (ROUTINE TESTING W REFLEX): HIV Screen 4th Generation wRfx: NONREACTIVE

## 2020-10-07 MED ORDER — ACETAMINOPHEN 325 MG PO TABS: 650.0000 mg | ORAL_TABLET | ORAL | Status: DC | PRN

## 2020-10-07 MED ORDER — ACETAMINOPHEN 650 MG RE SUPP: 650.0000 mg | RECTAL | Status: DC | PRN

## 2020-10-07 MED ORDER — SODIUM CHLORIDE 0.9 % IV SOLN: INTRAVENOUS | Status: AC

## 2020-10-07 MED ORDER — INSULIN ASPART 100 UNIT/ML ~~LOC~~ SOLN: 0.0000 [IU] | Freq: Three times a day (TID) | SUBCUTANEOUS | Status: DC

## 2020-10-07 MED ORDER — ASPIRIN EC 81 MG PO TBEC
81.0000 mg | DELAYED_RELEASE_TABLET | Freq: Every day | ORAL | Status: DC
  Administered 2020-10-07: 81 mg via ORAL
  Filled 2020-10-07 (×2): qty 1

## 2020-10-07 MED ORDER — SIMVASTATIN 20 MG PO TABS
40.0000 mg | ORAL_TABLET | Freq: Every evening | ORAL | Status: DC
  Administered 2020-10-07 – 2020-10-08 (×2): 40 mg via ORAL
  Filled 2020-10-07: qty 2
  Filled 2020-10-07: qty 4

## 2020-10-07 MED ORDER — ACETAMINOPHEN 160 MG/5ML PO SOLN: 650.0000 mg | ORAL | Status: DC | PRN

## 2020-10-07 MED ORDER — INSULIN ASPART 100 UNIT/ML ~~LOC~~ SOLN: 0.0000 [IU] | Freq: Every day | SUBCUTANEOUS | Status: DC

## 2020-10-07 MED ORDER — STROKE: EARLY STAGES OF RECOVERY BOOK
Freq: Once | Status: DC
  Filled 2020-10-07: qty 1

## 2020-10-07 NOTE — ED Notes (Signed)
Request to NT , Gregary Signs, or Deanna to move to floor please

## 2020-10-07 NOTE — ED Notes (Signed)
Dr Laverta Baltimore at bedside with pt and family.

## 2020-10-07 NOTE — ED Triage Notes (Signed)
EMS reports pt has history of stroke.  Reports pt stated having slurred speech, trouble forming words, abnormal gait, and left sided weakness Oct 31.  Reports has had these symptoms since then.  CBG 174, bp 153/108.  Pt alert and oriented.  EMS says previous stroke did not leave pt with residual symptoms except he gets emotional.  EMS started 18g in left ac.

## 2020-10-07 NOTE — ED Notes (Signed)
Per report  DM.RN attempted to call report x 2 without success as no one answered the phone

## 2020-10-07 NOTE — ED Notes (Addendum)
Second call for report by this RN  (per report 2 previous calls for report unanswered)  "Nurse is still in report"

## 2020-10-07 NOTE — H&P (Signed)
History and Physical    Raymond Barrera. DZH:[id number] DOB: [birth year] DOA: 10/07/2020  PCP: Lucia Gaskins, MD   Patient coming from: Home  Chief Complaint: Speech slurring  HPI: Raymond Barrera. is a 59 y.o. male with medical history significant for TIAs with prior CVA, type 2 diabetes, CKD 3B, dyslipidemia, hypertension, prior MI, sleep apnea, and renal cell carcinoma status post prior nephrectomy who presented to the ED at the urging of his wife for slurred speech that began approximately 7 days prior. He seems to think that his symptoms began approximately around 10/29 and he has been stubborn about presenting to the ED for further evaluation despite knowing that these could be stroke like symptoms. He has also had some difficulty walking with his cane at baseline and he has had some weakness in both of his hands prohibiting some fine motor function. He does not have any new symptoms over the last 24 h, but his symptoms have persisted over the last several days. His speech is normally clear otherwise. He denies any headaches, but does have some intermittent blurry vision. He denies any nausea or vomiting, chest pain, shortness of breath, fevers, or chills. He is noted to have some residual weakness greater on the right side from a CVA on 01/2019. He is also noted to have blood sugar and blood pressure elevations at home, all despite taking his usual home medications.   ED Course: Vital signs demonstrate ongoing elevated blood pressure readings. EKG with LVH and otherwise sinus rhythm. Creatinine is 2.2, per his usual baseline. CT of the head with no acute abnormalities, but prior chronic CVAs and atrophy otherwise noted. His blood glucose is 160. EDP had discussed with neurology Dr. Rory Percy who recommends brain MRI in a.m. and usual work-up with follow-up to neurology in a.m.  Review of Systems: All others reviewed and otherwise negative except as noted above.  Past Medical History:    Diagnosis Date  . Ambulates with cane    straight - uses occasionally  . Anxiety    due to the stroke  . Arthritis   . Back pain    hx of buldging disc  . Complication of anesthesia    took a while for him to wake up after previous anesthesia  . Diabetes mellitus without complication ([place])   . Family history of adverse reaction to anesthesia    "sometimes mom has a hard time waking up"  . High cholesterol    takes Zocor daily  . Hypertension    takes Benazepril and HCTZ  daily  . Joint pain   . Joint swelling   . Memory impairment    occassional - from stroke  . Myocardial infarction ([facility]) 1987  . Pneumonia    hx of-80's  . Shortness of breath dyspnea    do to pain  . Sleep apnea    never had a sleep study,but states Dr. Cindie Laroche says he has it  . Slurred speech   . Stroke ([facility]) 08/2013   7 mini-strokes, last stroke 2017  . TIA (transient ischemic attack) 2014   x 7   . Urinary frequency     Past Surgical History:  Procedure Laterality Date  . ABDOMINAL EXPOSURE N/A 06/16/2018   Procedure: ABDOMINAL EXPOSURE;  Surgeon: Rosetta Posner, MD;  Location: [facility];  Service: Vascular;  Laterality: N/A;  . ANKLE SURGERY  2008   left ankle-otif-[facility]  . ANTERIOR LUMBAR FUSION Bilateral 06/16/2018   Procedure: LUMBAR 4-5  LUMBAR 5-SACRUM 1 ANTERIOR LUMBAR INTERBODY FUSION WITH INSTRUMENTATION AND ALLOGRAFT;  Surgeon: Phylliss Bob, MD;  Location: [facility];  Service: Orthopedics;  Laterality: Bilateral;  . BACK SURGERY    . HEMATOMA EVACUATION Left 08/13/2018   Procedure: EVACUATION HEMATOMA LEFT ABDOMINAL WALL;  Surgeon: Rosetta Posner, MD;  [facility];  Service: Vascular;  Laterality: Left;  . IR RADIOLOGIST EVAL & MGMT  07/27/2018  . IR US GUIDE BX ASP/DRAIN  07/14/2018  . JOINT REPLACEMENT     both hips replaced   . LUMBAR LAMINECTOMY/DECOMPRESSION MICRODISCECTOMY Right 10/17/2014   Procedure: LUMBAR LAMINECTOMY/DECOMPRESSION MICRODISCECTOMY 2 LEVELS;  Surgeon: [name], MD;  Location: [facility];  Service: Neurosurgery;  Laterality: Right;  Right L45 L5S1 laminectomy and foraminotomy  . MASS EXCISION  09/13/2012   Procedure: EXCISION MASS;  Surgeon: Jamesetta So, MD;  [facility];  Service: General;  Laterality: N/A;  . ROBOTIC ASSITED PARTIAL NEPHRECTOMY Left 03/30/2019   Procedure: XI ROBOTIC ASSITED PARTIAL NEPHRECTOMY POSSIBLE RADICAL NEPHRECTOMY;  Surgeon: Ceasar Mons, MD;  [facility];  Service: Urology;  Laterality: Left;  . SHOULDER ARTHROSCOPY WITH ROTATOR CUFF REPAIR Right 04/12/2020   Procedure: right shoulder arthroscopy, debridement, mini open rotator cuff tear repair;  Surgeon: Meredith Pel, MD;  Location: [facility];  Service: Orthopedics;  Laterality: Right;  . TONSILLECTOMY    . TOTAL HIP ARTHROPLASTY Right 03/20/2015  . TOTAL HIP ARTHROPLASTY Right 03/20/2015   Procedure: TOTAL HIP ARTHROPLASTY ANTERIOR APPROACH;  Surgeon: Renette Butters, MD;  Location: [facility];  Service: Orthopedics;  Laterality: Right;  . TOTAL HIP ARTHROPLASTY Left 01/08/2016   Procedure: TOTAL HIP ARTHROPLASTY ANTERIOR APPROACH;  Surgeon: Renette Butters, MD;  Location: [facility];  Service: Orthopedics;  Laterality: Left;     reports that he has been smoking cigarettes. He has a 11.75 pack-year smoking history. He has never used smokeless tobacco. He reports previous alcohol use. He reports previous drug use. Drug: Cocaine.  Allergies  Allergen Reactions  . Poison Ivy Extract [Poison Ivy Extract] Itching and Rash    Family History  Problem Relation Age of Onset  . Heart disease Father     Prior to Admission medications   Medication Sig Start Date End Date Taking? Authorizing Provider  acetaminophen (TYLENOL) 500 MG tablet Take 1,000 mg by mouth every 4 (four) hours as needed for mild pain.   Yes [provider]  amLODipine (NORVASC) 10 MG tablet Take 10 mg by mouth daily.  07/19/19  Yes [provider]  aspirin EC  81 MG tablet Take 81 mg by mouth daily.   Yes [provider]  cholecalciferol (VITAMIN D3) 25 MCG (1000 UNIT) tablet Take 1,000 Units by mouth daily.   Yes [provider]  gabapentin (NEURONTIN) 300 MG capsule Take 1 capsule (300 mg total) by mouth 3 (three) times daily. Patient taking differently: Take 300 mg by mouth 3 (three) times daily as needed (nerve/joint pain and to help relax).  05/11/20  Yes Meredith Pel, MD  glucose 4 GM chewable tablet Chew 1 tablet by mouth as needed for low blood sugar.   Yes [provider]  labetalol (NORMODYNE) 300 MG tablet Take 150 mg by mouth 2 (two) times daily.  07/19/19  Yes [provider]  lidocaine (LIDODERM) 5 % Place 1 patch onto the skin daily. Remove & Discard patch within 12 hours or as directed by MD Patient taking differently: Place 1 patch onto  the skin daily as needed (PAIN). Remove & Discard patch within 12 hours or as directed by MD 07/10/19  Yes Langston Masker B, PA-C  metFORMIN (GLUCOPHAGE) 500 MG tablet Take 500 mg by mouth 2 (two) times daily with a meal.    Yes [provider]  ondansetron (ZOFRAN) 4 MG tablet Take 1 tablet (4 mg total) by mouth every 8 (eight) hours as needed for nausea or vomiting. 03/30/19  Yes Dancy, Estill Bamberg, PA-C  simvastatin (ZOCOR) 40 MG tablet Take 40 mg by mouth every evening.  07/19/19  Yes [provider]  tiZANidine (ZANAFLEX) 4 MG tablet Take 0.5-1 tablets (2-4 mg total) by mouth 2 (two) times daily as needed for muscle spasms. 05/11/20  Yes Meredith Pel, MD  gabapentin (NEURONTIN) 100 MG capsule Take 1 capsule (100 mg total) by mouth 3 (three) times daily. Patient not taking: Reported on 10/07/2020 06/08/20   Meredith Pel, MD  HYDROcodone-acetaminophen (NORCO/VICODIN) 5-325 MG tablet Take 1 tablet by mouth every 12 (twelve) hours as needed for moderate pain. Patient not taking: Reported on 10/07/2020 07/05/20   Meredith Pel, MD    HYDROcodone-acetaminophen (NORCO/VICODIN) 5-325 MG tablet Take 1 tablet by mouth every 12 (twelve) hours as needed for moderate pain. Patient not taking: Reported on 10/07/2020 07/29/20   Meredith Pel, MD  oxyCODONE-acetaminophen [facility]) 10-325 MG tablet 1 po q 6-8hr prn pain Patient not taking: Reported on 10/07/2020 05/11/20   Meredith Pel, MD  oxyCODONE-acetaminophen (PERCOCET) 5-325 MG tablet Take 1 tablet by mouth every 8 (eight) hours as needed for severe pain. Patient not taking: Reported on 10/07/2020 05/25/20 05/25/21  Meredith Pel, MD    Physical Exam: Vitals:   10/07/20 0945 10/07/20 1000 10/07/20 1022 10/07/20 1030  BP: (!) 156/89 (!) 141/99 (!) 164/100 (!) 156/108  Pulse: 75 80 75 79  Resp: (!) 21 18 (!) 24 (!) 26  Temp:   97.9 F (36.6 C)   TempSrc:   Oral   SpO2: 100% 99% 98% 97%    Constitutional: NAD, calm, comfortable, speech is slurred Vitals:   10/07/20 0945 10/07/20 1000 10/07/20 1022 10/07/20 1030  BP: (!) 156/89 (!) 141/99 (!) 164/100 (!) 156/108  Pulse: 75 80 75 79  Resp: (!) 21 18 (!) 24 (!) 26  Temp:   97.9 F (36.6 C)   TempSrc:   Oral   SpO2: 100% 99% 98% 97%   Eyes: lids and conjunctivae normal ENMT: Mucous membranes are moist.  Neck: normal, supple Respiratory: clear to auscultation bilaterally. Normal respiratory effort. No accessory muscle use.  Cardiovascular: Regular rate and rhythm, no murmurs. No extremity edema. Abdomen: no tenderness, no distention. Bowel sounds positive.  Musculoskeletal:  No joint deformity upper and lower extremities. Neurological: Weakness right greater than left noted in upper and lower extremities. Sensation to light touch appears to be intact bilaterally. Skin: no rashes, lesions, ulcers.  Psychiatric: Normal judgment and insight. Alert and oriented x 3. Normal mood.   Labs on Admission: I have personally reviewed following labs and imaging studies  CBC: Recent Labs  Lab 10/07/20 0906  10/07/20 1008  WBC 6.1  --   NEUTROABS 4.0  --   HGB 14.0 13.3  HCT 40.6 39.0  MCV 89.8  --   PLT 256  --    Basic Metabolic Panel: Recent Labs  Lab 10/07/20 0906 10/07/20 1008  NA 136 141  K 4.2 4.3  CL 102 104  CO2 25  --  GLUCOSE 169* 160*  BUN 25* 26*  CREATININE 2.06* 2.20*  CALCIUM 10.1  --    GFR: CrCl cannot be calculated (Unknown ideal weight.). Liver Function Tests: Recent Labs  Lab 10/07/20 0906  AST 20  ALT 25  ALKPHOS 79  BILITOT 0.6  PROT 7.9  ALBUMIN 4.2   No results for input(s): LIPASE, AMYLASE in the last 168 hours. No results for input(s): AMMONIA in the last 168 hours. Coagulation Profile: Recent Labs  Lab 10/07/20 0906  INR 1.0   Cardiac Enzymes: No results for input(s): CKTOTAL, CKMB, CKMBINDEX, TROPONINI in the last 168 hours. BNP (last 3 results) No results for input(s): PROBNP in the last 8760 hours. HbA1C: No results for input(s): HGBA1C in the last 72 hours. CBG: Recent Labs  Lab 10/07/20 0904  GLUCAP 173*   Lipid Profile: No results for input(s): CHOL, HDL, LDLCALC, TRIG, CHOLHDL, LDLDIRECT in the last 72 hours. Thyroid Function Tests: No results for input(s): TSH, T4TOTAL, FREET4, T3FREE, THYROIDAB in the last 72 hours. Anemia Panel: No results for input(s): VITAMINB12, FOLATE, FERRITIN, TIBC, IRON, RETICCTPCT in the last 72 hours. Urine analysis:    Component Value Date/Time   COLORURINE YELLOW 04/18/2019 1516   APPEARANCEUR CLEAR 04/18/2019 1516   LABSPEC 1.023 04/18/2019 1516   PHURINE 5.0 04/18/2019 1516   GLUCOSEU NEGATIVE 04/18/2019 1516   [place] 04/18/2019 1516   [place] 04/18/2019 1516   [place] 04/18/2019 1516   PROTEINUR 30 (A) 04/18/2019 1516   UROBILINOGEN 1.0 03/08/2015 1011   NITRITE NEGATIVE 04/18/2019 1516   LEUKOCYTESUR NEGATIVE 04/18/2019 1516    Radiological Exams on Admission: CT Head Wo Contrast  Result Date: 10/07/2020 CLINICAL DATA:  Neuro deficit,  acute, stroke suspected. Additional provided: Patient reports slurred speech, trouble forming words, abnormal gait and left-sided weakness. Symptoms since October 31st. EXAM: CT HEAD WITHOUT CONTRAST TECHNIQUE: Contiguous axial images were obtained from the base of the skull through the vertex without intravenous contrast. COMPARISON:  Brain MRI 04/19/2019. FINDINGS: Brain: Mild generalized cerebral atrophy. Redemonstrated chronic lacunar infarcts within the bilateral basal ganglia. Background moderate ill-defined hypoattenuation within the cerebral white matter is nonspecific, but compatible with chronic small vessel ischemic disease. There is no acute intracranial hemorrhage. No demarcated cortical infarct. No extra-axial fluid collection. No evidence of intracranial mass. No midline shift. Vascular: No hyperdense vessel.  Atherosclerotic calcifications. Skull: Normal. Negative for fracture or focal lesion. Sinuses/Orbits: Visualized orbits show no acute finding. No significant paranasal sinus disease at the imaged levels. IMPRESSION: No evidence of acute intracranial abnormality. Redemonstrated chronic bilateral basal ganglia lacunar infarcts. Background mild cerebral atrophy and moderate chronic small vessel ischemic disease. Electronically Signed   By: Kellie Simmering DO   On: 10/07/2020 09:53    EKG: Independently reviewed. SR 78bpm with LVH.  Assessment/Plan Active Problems:   CVA (cerebral vascular accident) ([place])    Persistent slurred speech likely indicative of CVA -History of prior CVA/TIAs noted, but head CT negative -Plan for further testing with brain MRI in a.m. -2D echocardiogram to be assessed with prior in 04/2019 with LVEF 50-55% and moderate LVH at that time -Fasting lipid profile -Hemoglobin A1c -PT/OT evaluation -Continue aspirin and statin -Patient has passed bedside evaluation for swallow and therefore will start diet  Type 2 diabetes with hyperglycemia -Carb modified  diet -SSI -Hold home medications -Check hemoglobin A1c  Dyslipidemia -Check lipid panel and continue statin  Hypertension -Hold home blood pressure agents and allow for permissive hypertension -Labetalol as needed  for blood pressure greater than 200/100  OSA -Does not use CPAP at home  CKD stage IIIb -Prior nephrectomy noted for renal cell carcinoma -Monitor labs -Currently stable and at baseline   DVT prophylaxis: SCDs Code Status: Full Family Communication: Mother at bedside Disposition Plan:Admit for CVA evaluation Consults called:Neurology for AM; EDP spoke with Dr. Rory Percy with no need for transfer at this time Admission status: Obs, Tele   [facility]  If 7PM-7AM, please contact night-coverage [email or web address]  10/07/2020, 11:12 AM

## 2020-10-07 NOTE — ED Notes (Signed)
Report to Rebecca, RN.

## 2020-10-07 NOTE — ED Notes (Addendum)
Pt has eaten supper provided prior to this RN care  Is watching TV   Is alert answers questions appropriately  Awaiting transfer to the floor

## 2020-10-07 NOTE — ED Notes (Signed)
Dr. Shah at bedside.

## 2020-10-07 NOTE — ED Provider Notes (Signed)
[facility] EMERGENCY DEPARTMENT Provider Note   CSN: [id number] Arrival date & time: 10/07/20  7253     History No chief complaint on file.   Raymond Barrera. is a 59 y.o. male.  HPI  Patient is a 59 year old male with past medical history of prior stroke, DM, HLD, HTN, MI, pneumonia, sleep apnea, numerous TIA, stroke, renal cell carcinoma s/p nephrectomy.   Patient presents today with profoundly slurred speech.  History is hampered by this however he is able to communicate that he has had difficulty speaking for approximately 7 days.  He is unsure of the exact date when his symptoms began.  He states he has difficulty walking at baseline has had more difficulty walking over the past week.  He states he has felt weak in both hands as well he he has been unable to do activities that require fine motor function.  He denies any new symptoms over the past 24 hours.  States that his symptoms have been constant and unchanging.  He states he normally does not have slurred speech.   He states he has had some intermittent blurry vision states he has no blurry vision currently.  Denies any headache, nausea or vomiting.  He states he is experiencing no pain currently specifically denies any chest pain, shortness of breath, headache.  Denies any fevers or chills, dysuria, frequency or urgency no cough, congestion, sinus pressure.  He also denies any trauma or hitting his head.   10:35 AM Diane --wife of patient discussion. On Oct 29 pt wasn't feeling well and BP 170/112. BS 248. Rested and improved some (wife thought might be d/t one kidney and dehydration). BS and BP continued to be elevated. Speech slurring started evening of the 29th and has been continuous and worsening. Shaking hands. Unstable on his feet. Falls into the wall when walking to bathroom. Uses crutch at baseline but much more unstable now. Major stroke March 2020 -- residual weakness R>L. Walking was off. Much worse short term memory.  Some speech difficulties too.      Past Medical History:  Diagnosis Date  . Ambulates with cane    straight - uses occasionally  . Anxiety    due to the stroke  . Arthritis   . Back pain    hx of buldging disc  . Complication of anesthesia    took a while for him to wake up after previous anesthesia  . Diabetes mellitus without complication ([place])   . Family history of adverse reaction to anesthesia    "sometimes mom has a hard time waking up"  . High cholesterol    takes Zocor daily  . Hypertension    takes Benazepril and HCTZ  daily  . Joint pain   . Joint swelling   . Memory impairment    occassional - from stroke  . Myocardial infarction ([place]) 1987  . Pneumonia    hx of-80's  . Shortness of breath dyspnea    do to pain  . Sleep apnea    never had a sleep study,but states Dr. Cindie Laroche says he has it  . Slurred speech   . Stroke ([place]) 08/2013   7 mini-strokes, last stroke 2017  . TIA (transient ischemic attack) 2014   x 7   . Urinary frequency     Patient Active Problem List   Diagnosis Date Noted  . CVA (cerebral vascular accident) ([place]) 10/07/2020  . Syncope 04/18/2019  . Chronic systolic heart failure ([place])   .  Pain of both hip joints   . History of renal cell carcinoma   . Renal mass 03/30/2019  . Abdominal hematoma 08/13/2018  . Radiculopathy 06/16/2018  . Acute CVA (cerebrovascular accident) ([place]) 02/01/2017  . Diabetes mellitus ([place]) 02/01/2017  . HTN (hypertension) 02/01/2017  . Primary osteoarthritis of left hip 01/08/2016  . DJD (degenerative joint disease) 03/20/2015  . Primary osteoarthritis of right hip 02/26/2015  . Lumbar spondylosis 10/17/2014  . Lumbago 07/25/2014  . Abnormality of gait 07/25/2014  . Difficulty in walking(719.7) 07/25/2014  . TIA (transient ischemic attack) 03/09/2013  . Cocaine abuse ([place]) 03/09/2013  . Accelerated hypertension 03/09/2013  . Hyperlipidemia 03/09/2013  . Current smoker 03/09/2013    Past Surgical  History:  Procedure Laterality Date  . ABDOMINAL EXPOSURE N/A 06/16/2018   Procedure: ABDOMINAL EXPOSURE;  Surgeon: Rosetta Posner, MD;  Location: [place];  Service: Vascular;  Laterality: N/A;  . ANKLE SURGERY  2008   left ankle-otif-[facility]  . ANTERIOR LUMBAR FUSION Bilateral 06/16/2018   Procedure: LUMBAR 4-5 LUMBAR 5-SACRUM 1 ANTERIOR LUMBAR INTERBODY FUSION WITH INSTRUMENTATION AND ALLOGRAFT;  Surgeon: Phylliss Bob, MD;  Location: [place];  Service: Orthopedics;  Laterality: Bilateral;  . BACK SURGERY    . HEMATOMA EVACUATION Left 08/13/2018   Procedure: EVACUATION HEMATOMA LEFT ABDOMINAL WALL;  Surgeon: Rosetta Posner, MD;  Location: [facility];  Service: Vascular;  Laterality: Left;  . IR RADIOLOGIST EVAL & MGMT  07/27/2018  . IR US GUIDE BX ASP/DRAIN  07/14/2018  . JOINT REPLACEMENT     both hips replaced   . LUMBAR LAMINECTOMY/DECOMPRESSION MICRODISCECTOMY Right 10/17/2014   Procedure: LUMBAR LAMINECTOMY/DECOMPRESSION MICRODISCECTOMY 2 LEVELS;  Surgeon: [name], MD;  Location: [place] [facility];  Service: Neurosurgery;  Laterality: Right;  Right L45 L5S1 laminectomy and foraminotomy  . MASS EXCISION  09/13/2012   Procedure: EXCISION MASS;  Surgeon: Jamesetta So, MD;  Location: [facility];  Service: General;  Laterality: N/A;  . ROBOTIC ASSITED PARTIAL NEPHRECTOMY Left 03/30/2019   Procedure: XI ROBOTIC ASSITED PARTIAL NEPHRECTOMY POSSIBLE RADICAL NEPHRECTOMY;  Surgeon: Ceasar Mons, MD;  Location: [facility];  Service: Urology;  Laterality: Left;  . SHOULDER ARTHROSCOPY WITH ROTATOR CUFF REPAIR Right 04/12/2020   Procedure: right shoulder arthroscopy, debridement, mini open rotator cuff tear repair;  Surgeon: Meredith Pel, MD;  Location: [place];  Service: Orthopedics;  Laterality: Right;  . TONSILLECTOMY    . TOTAL HIP ARTHROPLASTY Right 03/20/2015  . TOTAL HIP ARTHROPLASTY Right 03/20/2015   Procedure: TOTAL HIP ARTHROPLASTY ANTERIOR APPROACH;  Surgeon: Renette Butters, MD;   Location: [place];  Service: Orthopedics;  Laterality: Right;  . TOTAL HIP ARTHROPLASTY Left 01/08/2016   Procedure: TOTAL HIP ARTHROPLASTY ANTERIOR APPROACH;  Surgeon: Renette Butters, MD;  Location: [place];  Service: Orthopedics;  Laterality: Left;       Family History  Problem Relation Age of Onset  . Heart disease Father     Social History   Tobacco Use  . Smoking status: Current Some Day Smoker    Packs/day: 0.25    Years: 47.00    Pack years: 11.75    Types: Cigarettes  . Smokeless tobacco: Never Used  Vaping Use  . Vaping Use: Never used  Substance Use Topics  . Alcohol use: Not Currently    Comment: quit 2012  . Drug use: Not Currently    Types: Cocaine    Comment: many yrs ago., last time- late 2016    Home Medications  Prior to Admission medications   Medication Sig Start Date End Date Taking? Authorizing Provider  lidocaine (LIDODERM) 5 % Place 1 patch onto the skin daily. Remove & Discard patch within 12 hours or as directed by MD Patient taking differently: Place 1 patch onto the skin daily as needed (PAIN). Remove & Discard patch within 12 hours or as directed by MD 07/10/19  Yes Langston Masker B, PA-C  amLODipine (NORVASC) 10 MG tablet Take 10 mg by mouth daily.  07/19/19   [provider]  aspirin EC 81 MG tablet Take 81 mg by mouth daily.    [provider]  cholecalciferol (VITAMIN D3) 25 MCG (1000 UNIT) tablet Take 1,000 Units by mouth daily.    [provider]  gabapentin (NEURONTIN) 100 MG capsule Take 1 capsule (100 mg total) by mouth 3 (three) times daily. 06/08/20   Meredith Pel, MD  gabapentin (NEURONTIN) 300 MG capsule Take 1 capsule (300 mg total) by mouth 3 (three) times daily. 05/11/20   Meredith Pel, MD  glucose 4 GM chewable tablet Chew 1 tablet by mouth as needed for low blood sugar.    [provider]  HYDROcodone-acetaminophen (NORCO/VICODIN) 5-325 MG tablet Take 1 tablet by mouth every 12 (twelve)  hours as needed for moderate pain. 07/05/20   Meredith Pel, MD  HYDROcodone-acetaminophen (NORCO/VICODIN) 5-325 MG tablet Take 1 tablet by mouth every 12 (twelve) hours as needed for moderate pain. 07/29/20   Meredith Pel, MD  labetalol (NORMODYNE) 200 MG tablet Take 100 mg by mouth 2 (two) times daily.  07/19/19   [provider]  metFORMIN (GLUCOPHAGE) 500 MG tablet Take by mouth 2 (two) times daily with a meal.    [provider]  ondansetron (ZOFRAN) 4 MG tablet Take 1 tablet (4 mg total) by mouth every 8 (eight) hours as needed for nausea or vomiting. 03/30/19   Debbrah Alar, PA-C  oxyCODONE-acetaminophen (PERCOCET) 10-325 MG tablet 1 po q 6-8hr prn pain 05/11/20   Meredith Pel, MD  oxyCODONE-acetaminophen (PERCOCET) 5-325 MG tablet Take 1 tablet by mouth every 8 (eight) hours as needed for severe pain. 05/25/20 05/25/21  Meredith Pel, MD  simvastatin (ZOCOR) 40 MG tablet Take 40 mg by mouth every evening.  07/19/19   [provider]  tiZANidine (ZANAFLEX) 4 MG tablet Take 0.5-1 tablets (2-4 mg total) by mouth 2 (two) times daily as needed for muscle spasms. 05/11/20   Meredith Pel, MD    Allergies    Poison ivy extract [poison ivy extract]  Review of Systems   Review of Systems  Constitutional: Negative for chills and fever.  HENT: Negative for congestion.   Eyes: Positive for visual disturbance. Negative for pain.  Respiratory: Negative for cough and shortness of breath.   Cardiovascular: Negative for chest pain and leg swelling.  Gastrointestinal: Negative for abdominal pain, diarrhea, nausea and vomiting.  Genitourinary: Negative for dysuria.  Musculoskeletal: Negative for myalgias.  Skin: Negative for rash.  Neurological: Positive for facial asymmetry, speech difficulty and weakness. Negative for dizziness, numbness and headaches.    Physical Exam Updated Vital Signs BP (!) 164/100 (BP Location: Left Arm)   Pulse 75   Temp  97.9 F (36.6 C) (Oral)   Resp (!) 24   SpO2 98%   Physical Exam Vitals and nursing note reviewed.  Constitutional:      General: He is not in acute distress.    Appearance: He is not toxic-appearing or  diaphoretic.     Comments: Pleasant well-appearing 59 year old.  In no acute distress.  Sitting comfortably in bed. Able to follow commands but struggling to articulate words due to profound dysarthria. No increased work of breathing.  HENT:     Head: Normocephalic and atraumatic.     Nose: Nose normal.     Mouth/Throat:     Mouth: Mucous membranes are moist.  Eyes:     General: No scleral icterus. Cardiovascular:     Rate and Rhythm: Normal rate and regular rhythm.     Pulses: Normal pulses.     Heart sounds: Normal heart sounds.     Comments: Regular rhythm. Pulmonary:     Effort: Pulmonary effort is normal. No respiratory distress.     Breath sounds: Normal breath sounds. No wheezing or rales.  Abdominal:     Palpations: Abdomen is soft.     Tenderness: There is no abdominal tenderness. There is no guarding or rebound.  Musculoskeletal:     Cervical back: Normal range of motion.     Right lower leg: No edema.     Left lower leg: No edema.  Skin:    General: Skin is warm and dry.     Capillary Refill: Capillary refill takes less than 2 seconds.  Neurological:     Mental Status: He is alert. Mental status is at baseline.     Comments: Difficult to assess A&O status as patient is dysarthric as well as having difficulty with word finding.  States "I cannot get it out "when asked to state his full name. Shakes his head when asked what year it is and laughs seemingly in frustration.   Strength 4/5 in right upper extremity extension at the elbow and flexion of the shoulder.  5/5 grip and flexion of the elbow.  Strength 5/5 in left upper extremity Sensation intact in upper/lower extremities   No pronator obvious pronator drift. Normal finger-to-nose and feet tapping  CN I not  tested  CN II grossly intact visual fields bilaterally. Did not visualize posterior eye.   CN III, IV, VI PERRLA and EOMs intact bilaterally  CN V Intact sensation to sharp and light touch to the face  CN VII facial asymmetry is present CN VIII not tested  CN IX, X no uvula deviation, symmetric rise of soft palate  CN XI 5/5 SCM and trapezius strength bilaterally  CN XII Midline tongue protrusion, symmetric L/R movements   Psychiatric:        Mood and Affect: Mood normal.        Behavior: Behavior normal.     ED Results / Procedures / Treatments   Labs (all labs ordered are listed, but only abnormal results are displayed) Labs Reviewed  COMPREHENSIVE METABOLIC PANEL - Abnormal; Notable for the following components:      Result Value   Glucose, Bld 169 (*)    BUN 25 (*)    Creatinine, Ser 2.06 (*)    GFR, Estimated 36 (*)    All other components within normal limits  CBG MONITORING, ED - Abnormal; Notable for the following components:   Glucose-Capillary 173 (*)    All other components within normal limits  I-STAT CHEM 8, ED - Abnormal; Notable for the following components:   BUN 26 (*)    Creatinine, Ser 2.20 (*)    Glucose, Bld 160 (*)    All other components within normal limits  RESPIRATORY PANEL BY RT PCR (FLU A&B, COVID)  ETHANOL  PROTIME-INR  APTT  CBC  DIFFERENTIAL  RAPID URINE DRUG SCREEN, HOSP PERFORMED  URINALYSIS, ROUTINE W REFLEX MICROSCOPIC    EKG EKG Interpretation  Date/Time:  Sunday October 07 2020 09:04:48 EST Ventricular Rate:  78 PR Interval:    QRS Duration: 102 QT Interval:  390 QTC Calculation: 445 R Axis:   19 Text Interpretation: Sinus rhythm Left ventricular hypertrophy No STEMI Confirmed by Nanda Quinton [phone]) on 10/07/2020 9:38:06 AM   Radiology CT Head Wo Contrast  Result Date: 10/07/2020 CLINICAL DATA:  Neuro deficit, acute, stroke suspected. Additional provided: Patient reports slurred speech, trouble forming words, abnormal  gait and left-sided weakness. Symptoms since October 31st. EXAM: CT HEAD WITHOUT CONTRAST TECHNIQUE: Contiguous axial images were obtained from the base of the skull through the vertex without intravenous contrast. COMPARISON:  Brain MRI 04/19/2019. FINDINGS: Brain: Mild generalized cerebral atrophy. Redemonstrated chronic lacunar infarcts within the bilateral basal ganglia. Background moderate ill-defined hypoattenuation within the cerebral white matter is nonspecific, but compatible with chronic small vessel ischemic disease. There is no acute intracranial hemorrhage. No demarcated cortical infarct. No extra-axial fluid collection. No evidence of intracranial mass. No midline shift. Vascular: No hyperdense vessel.  Atherosclerotic calcifications. Skull: Normal. Negative for fracture or focal lesion. Sinuses/Orbits: Visualized orbits show no acute finding. No significant paranasal sinus disease at the imaged levels. IMPRESSION: No evidence of acute intracranial abnormality. Redemonstrated chronic bilateral basal ganglia lacunar infarcts. Background mild cerebral atrophy and moderate chronic small vessel ischemic disease. Electronically Signed   By: Kellie Simmering DO   On: 10/07/2020 09:53    Procedures Procedures (including critical care time)  Medications Ordered in ED Medications - No data to display  ED Course  I have reviewed the triage vital signs and the nursing notes.  Pertinent labs & imaging results that were available during my care of the patient were reviewed by me and considered in my medical decision making (see chart for details).  Patient 59 year old male presented today with complaints of slurred speech and weakness.  Similar symptoms to when he had a stroke in 2020.  Patient has significant past medical history detailed above specifically significant for HLD, HTN, strokes, TIAs, CKD s/p nephrectomy from renal cell carcinoma, DM.  Physical exam is notable for right upper extremity  weakness, dysarthria, difficulty with word finding.  He is alert and attempted to answer questions to the best of his ability.  Given the timeline of his symptoms code stroke was not initiated patient is outside of LVO window.  Given his right upper extremity weakness and his dysarthria he is van positive.  Discussed with neurology who reviewed his noncontrast head CT as well as prior MRIs.  It appears that he had acute stroke in 2018.  Since then he has had MRI is consistent with more hypertensive emergency/uncontrolled hypertension/chronic white matter disease.  Clinical Course as of Oct 07 1034  Sun Oct 07, 2020  1015 Discussed with Dr. Rory Percy of neurology who recommends admission here at [facility] for evaluation by neurology tomorrow and speech therapy   [WF]    Clinical Course User Index [WF] Tedd Sias, PA   CT head without any acute abnormalities.  Significant chronic white matter disease.  Consistent with uncontrolled blood pressure.  CBC without leukocytosis or anemia CMP notable for somewhat elevated blood sugar BUN and creatinine so an elevated creatinine approximately at baseline. Ethanol negative.  Covid swab negative.  Coags within normal limits.  Urinalysis and urine drug screen pending at  this time.  Patient assures me he has used no cocaine amphetamines or other recreational drugs patient recently.  He denies any use specifically was in the month prior to his symptom onset.  MDM Rules/Calculators/A&P                          The patient appears reasonably stabilized for admission considering the current resources, flow, and capabilities available in the ED at this time, and I doubt any other [facility] requiring further screening and/or treatment in the ED prior to admission.  Discussed with hospitalist Pratik Manuella Ghazi who will admit. Pt and mother aware of admission decision.   Final Clinical Impression(s) / ED Diagnoses Final diagnoses:  Dysarthria  Arm weakness     Rx / DC Orders ED Discharge Orders    None       Tedd Sias, [place] 10/07/20 1037    Margette Fast, MD 10/08/20 [phone]

## 2020-10-08 ENCOUNTER — Observation Stay (HOSPITAL_COMMUNITY): Payer: Medicaid Other

## 2020-10-08 DIAGNOSIS — I69351 Hemiplegia and hemiparesis following cerebral infarction affecting right dominant side: Secondary | ICD-10-CM | POA: Diagnosis not present

## 2020-10-08 DIAGNOSIS — Z20822 Contact with and (suspected) exposure to covid-19: Secondary | ICD-10-CM | POA: Diagnosis present

## 2020-10-08 DIAGNOSIS — Z85528 Personal history of other malignant neoplasm of kidney: Secondary | ICD-10-CM | POA: Diagnosis not present

## 2020-10-08 DIAGNOSIS — Z8249 Family history of ischemic heart disease and other diseases of the circulatory system: Secondary | ICD-10-CM | POA: Diagnosis not present

## 2020-10-08 DIAGNOSIS — R29703 NIHSS score 3: Secondary | ICD-10-CM | POA: Diagnosis present

## 2020-10-08 DIAGNOSIS — I6612 Occlusion and stenosis of left anterior cerebral artery: Secondary | ICD-10-CM | POA: Diagnosis present

## 2020-10-08 DIAGNOSIS — F17210 Nicotine dependence, cigarettes, uncomplicated: Secondary | ICD-10-CM | POA: Diagnosis present

## 2020-10-08 DIAGNOSIS — R262 Difficulty in walking, not elsewhere classified: Secondary | ICD-10-CM | POA: Diagnosis present

## 2020-10-08 DIAGNOSIS — Z96643 Presence of artificial hip joint, bilateral: Secondary | ICD-10-CM | POA: Diagnosis present

## 2020-10-08 DIAGNOSIS — I6389 Other cerebral infarction: Secondary | ICD-10-CM | POA: Diagnosis not present

## 2020-10-08 DIAGNOSIS — N1832 Chronic kidney disease, stage 3b: Secondary | ICD-10-CM | POA: Diagnosis present

## 2020-10-08 DIAGNOSIS — Z905 Acquired absence of kidney: Secondary | ICD-10-CM | POA: Diagnosis not present

## 2020-10-08 DIAGNOSIS — Z79899 Other long term (current) drug therapy: Secondary | ICD-10-CM | POA: Diagnosis not present

## 2020-10-08 DIAGNOSIS — Z981 Arthrodesis status: Secondary | ICD-10-CM | POA: Diagnosis not present

## 2020-10-08 DIAGNOSIS — H538 Other visual disturbances: Secondary | ICD-10-CM | POA: Diagnosis present

## 2020-10-08 DIAGNOSIS — I639 Cerebral infarction, unspecified: Secondary | ICD-10-CM | POA: Diagnosis present

## 2020-10-08 DIAGNOSIS — Z7982 Long term (current) use of aspirin: Secondary | ICD-10-CM | POA: Diagnosis not present

## 2020-10-08 DIAGNOSIS — G9349 Other encephalopathy: Secondary | ICD-10-CM | POA: Diagnosis present

## 2020-10-08 DIAGNOSIS — R471 Dysarthria and anarthria: Secondary | ICD-10-CM | POA: Diagnosis present

## 2020-10-08 DIAGNOSIS — F419 Anxiety disorder, unspecified: Secondary | ICD-10-CM | POA: Diagnosis present

## 2020-10-08 DIAGNOSIS — I252 Old myocardial infarction: Secondary | ICD-10-CM | POA: Diagnosis not present

## 2020-10-08 DIAGNOSIS — E785 Hyperlipidemia, unspecified: Secondary | ICD-10-CM | POA: Diagnosis present

## 2020-10-08 DIAGNOSIS — Z9109 Other allergy status, other than to drugs and biological substances: Secondary | ICD-10-CM | POA: Diagnosis not present

## 2020-10-08 DIAGNOSIS — I129 Hypertensive chronic kidney disease with stage 1 through stage 4 chronic kidney disease, or unspecified chronic kidney disease: Secondary | ICD-10-CM | POA: Diagnosis present

## 2020-10-08 DIAGNOSIS — E1165 Type 2 diabetes mellitus with hyperglycemia: Secondary | ICD-10-CM | POA: Diagnosis present

## 2020-10-08 LAB — LIPID PANEL
Cholesterol: 164 mg/dL (ref 0–200)
HDL: 32 mg/dL — ABNORMAL LOW (ref >40)
LDL Cholesterol: 89 mg/dL (ref 0–99)
Total CHOL/HDL Ratio: 5.1 RATIO
Triglycerides: 213 mg/dL — ABNORMAL HIGH (ref <150)
VLDL: 43 mg/dL — ABNORMAL HIGH (ref 0–40)

## 2020-10-08 LAB — ECHOCARDIOGRAM COMPLETE
Area-P 1/2: 2.66 cm2
S' Lateral: 3.54 cm

## 2020-10-08 LAB — GLUCOSE, CAPILLARY
Glucose-Capillary: 126 mg/dL — ABNORMAL HIGH (ref 70–99)
Glucose-Capillary: 181 mg/dL — ABNORMAL HIGH (ref 70–99)
Glucose-Capillary: 95 mg/dL (ref 70–99)
Glucose-Capillary: 96 mg/dL (ref 70–99)
Glucose-Capillary: 188 mg/dL — ABNORMAL HIGH (ref 70–99)

## 2020-10-08 LAB — HEMOGLOBIN A1C
Hgb A1c MFr Bld: 6.8 % — ABNORMAL HIGH (ref 4.8–5.6)
Mean Plasma Glucose: 148.46 mg/dL

## 2020-10-08 MED ORDER — CLOPIDOGREL BISULFATE 75 MG PO TABS
75.0000 mg | ORAL_TABLET | Freq: Every day | ORAL | Status: DC
  Administered 2020-10-08 – 2020-10-09 (×2): 75 mg via ORAL
  Filled 2020-10-08 (×2): qty 1

## 2020-10-08 MED ORDER — ASPIRIN EC 81 MG PO TBEC
162.0000 mg | DELAYED_RELEASE_TABLET | Freq: Every day | ORAL | Status: DC
  Administered 2020-10-08 – 2020-10-09 (×2): 162 mg via ORAL
  Filled 2020-10-08 (×3): qty 2

## 2020-10-08 NOTE — Plan of Care (Signed)
  Problem: Education: Goal: Knowledge of disease or condition will improve Outcome: Progressing Goal: Knowledge of secondary prevention will improve Outcome: Progressing   Problem: Self-Care: Goal: Ability to participate in self-care as condition permits will improve Outcome: Progressing   Problem: Ischemic Stroke/TIA Tissue Perfusion: Goal: Complications of ischemic stroke/TIA will be minimized Outcome: Progressing

## 2020-10-08 NOTE — Progress Notes (Signed)
*  PRELIMINARY RESULTS* Echocardiogram 2D Echocardiogram has been performed.  Leavy Cella 10/08/2020, 10:15 AM

## 2020-10-08 NOTE — Evaluation (Signed)
Physical Therapy Evaluation Patient Details Name: Raymond Barrera. MRN: [id number] DOB: [birth year] Today's Date: 10/08/2020   History of Present Illness  Kyzer Blowe. is a 59 y.o. male with medical history significant for TIAs with prior CVA, type 2 diabetes, CKD 3B, dyslipidemia, hypertension, prior MI, sleep apnea, and renal cell carcinoma status post prior nephrectomy who presented to the ED at the urging of his wife for slurred speech that began approximately 7 days prior. He seems to think that his symptoms began approximately around 10/29 and he has been stubborn about presenting to the ED for further evaluation despite knowing that these could be stroke like symptoms. He has also had some difficulty walking with his cane at baseline and he has had some weakness in both of his hands prohibiting some fine motor function. He does not have any new symptoms over the last 24 h, but his symptoms have persisted over the last several days. His speech is normally clear otherwise. He denies any headaches, but does have some intermittent blurry vision. He denies any nausea or vomiting, chest pain, shortness of breath, fevers, or chills. He is noted to have some residual weakness greater on the right side from a CVA on 01/2019. He is also noted to have blood sugar and blood pressure elevations at home, all despite taking his usual home medications.    Clinical Impression  Patient functioning at baseline for functional mobility and gait.  Plan:  Patient discharged from physical therapy to care of nursing for ambulation daily as tolerated for length of stay.     Follow Up Recommendations No PT follow up;Supervision - Intermittent    Equipment Recommendations  None recommended by PT    Recommendations for Other Services       Precautions / Restrictions Precautions Precautions: None Restrictions Weight Bearing Restrictions: No      Mobility  Bed Mobility Overal bed mobility: Independent                   Transfers Overall transfer level: Modified independent                  Ambulation/Gait Ambulation/Gait assistance: Modified independent (Device/Increase time) Gait Distance (Feet): 100 Feet Assistive device: None Gait Pattern/deviations: WFL(Within Functional Limits) Gait velocity: slightly decreased   General Gait Details: demonstrates good return for ambulation in room and hallway without loss of balance  Stairs            Wheelchair Mobility    Modified Rankin (Stroke Patients Only)       Balance Overall balance assessment: No apparent balance deficits (not formally assessed)                                           Pertinent Vitals/Pain Pain Assessment: No/denies pain    Home Living Family/patient expects to be discharged to:: Private residence Living Arrangements: Spouse/significant other Available Help at Discharge: Family;Available 24 hours/day Type of Home: Mobile home Home Access: Stairs to enter Entrance Stairs-Rails: Right;Left;Can reach both Entrance Stairs-Number of Steps: 2 Home Layout: One level Home Equipment: Walker - 2 wheels;Cane - single point;Shower seat;Bedside commode      Prior Function Level of Independence: Independent with assistive device(s)         Comments: household and short distanced community ambulator with SPC PRN     Hand Dominance  Dominant Hand: Right    Extremity/Trunk Assessment   Upper Extremity Assessment Upper Extremity Assessment: Defer to OT evaluation    Lower Extremity Assessment Lower Extremity Assessment: Overall WFL for tasks assessed    Cervical / Trunk Assessment Cervical / Trunk Assessment: Normal  Communication   Communication: Expressive difficulties  Cognition Arousal/Alertness: Awake/alert Behavior During Therapy: WFL for tasks assessed/performed Overall Cognitive Status: Within Functional Limits for tasks assessed                                         General Comments      Exercises     Assessment/Plan    PT Assessment Patent does not need any further PT services  PT Problem List         PT Treatment Interventions      PT Goals (Current goals can be found in the Care Plan section)  Acute Rehab PT Goals Patient Stated Goal: return home with family to assist PT Goal Formulation: With patient Time For Goal Achievement: 10/08/20 Potential to Achieve Goals: Good    Frequency     Barriers to discharge        Co-evaluation               AM-PAC PT "6 Clicks" Mobility  Outcome Measure Help needed turning from your back to your side while in a flat bed without using bedrails?: None Help needed moving from lying on your back to sitting on the side of a flat bed without using bedrails?: None Help needed moving to and from a bed to a chair (including a wheelchair)?: None Help needed standing up from a chair using your arms (e.g., wheelchair or bedside chair)?: None Help needed to walk in hospital room?: None Help needed climbing 3-5 steps with a railing? : None 6 Click Score: 24    End of Session   Activity Tolerance: Patient tolerated treatment well Patient left: in chair;with nursing/sitter in room;Other (comment) (left in wheelchair to be taken by nursing staff to a procedure) Nurse Communication: Mobility status PT Visit Diagnosis: Unsteadiness on feet (R26.81);Other abnormalities of gait and mobility (R26.89);Muscle weakness (generalized) (M62.81)    Time: 3235-5732 PT Time Calculation (min) (ACUTE ONLY): 16 min   Charges:   PT Evaluation $PT Eval Low Complexity: 1 Low PT Treatments $Therapeutic Activity: 8-22 mins        9:21 AM, 10/08/20 Lonell Grandchild, MPT Physical Therapist with [facility] [phone] office [phone] mobile phone

## 2020-10-08 NOTE — Progress Notes (Signed)
OT Cancellation Note  Patient Details Name: Raymond Barrera. MRN: [id number] DOB: January 04, 1961   Cancelled Treatment:    Reason Eval/Treat Not Completed: OT screened, no needs identified, will sign off. PT completing evaluation upon OT arrival. Patient is functioning at baseline of Modified independent to independent level. OT is familiar with patient from outpatient clinic. He lives with wife in mobile home and she is available to assist 24/7 if needed. DME available at home includes BSC, walker,cane, and shower seat. No follow OT needs are identified at this time.   Ailene Ravel, OTR/L,CBIS  [phone]  10/08/2020, 8:19 AM

## 2020-10-08 NOTE — Progress Notes (Signed)
Patient in MRI, attempt echo later.

## 2020-10-08 NOTE — Consult Note (Signed)
Chilton A. Merlene Laughter, MD     [email or web address]          Raymond Aikman. is an 59 y.o. male.   ASSESSMENT/PLAN: 1. Acute dysarthria due to acute infarct involving the left corpus callosum. This appears to be a intracranial atherosclerotic disease. Dual antiplatelet agents with aspirin 325 and Plavix is recommended for 3 months. Subsequently, single agent preferably Plavix is recommended. The patient should be maintained on simvastatin. Blood pressure and blood sugar control is recommended. Consider speech therapy. 2. Multivessel intracranial occlusive disease: Plan as outlined above. 3. Hypertension 4. Diabetes 5. Questionable thrombus seen on carotid duplex Doppler of the internal with vena caval system. Ultrasound of the upper extremity as recommended by the radiologist.    The patient presents with the relatively acute onset of severe speech impairment over last few days. He apparently has had a stroke in the past and is on aspirin 81 mg per the wife. He also has diabetes and the is on medications for this include a statin medication was wife tells me he is taking. The patient reports that only has the severe speech impairment. He denies any focal weakness or numbness. He denies headaches or dizziness. He denies chest pain shortness of. The review systems otherwise negative.    GENERAL: This is a pleasant male who is doing well at this time.  HEENT: Neck is supple no trauma noted.  ABDOMEN: Soft  EXTREMITIES: No edema   BACK: Normal alignment.  SKIN: Normal by inspection.    MENTAL STATUS: Patient awake and follows commands briskly. He has a severe dysarthria but naming, repetition and comprehension are intact indicating no clear language impairment. Fluency is reduced but this seems to be more due to severe dysarthria.  CRANIAL NERVES: Pupils are equal, round and reactive to light and accommodation; extraocular movements are full, there is no  significant nystagmus; upper and lower facial muscles are normal in strength and symmetric, there is no flattening of the nasolabial folds; tongue is midline; uvula is midline; shoulder elevation is normal.  MOTOR: Normal tone, bulk and strength; no pronator drift. There is no drift of the upper lower extremities.  COORDINATION: Left finger to nose is normal, right finger to nose is normal, No rest tremor; no intention tremor; no postural tremor; no bradykinesia.  REFLEXES: Deep tendon reflexes are symmetrical and normal.   SENSATION: Normal to light touch and temperature.   NIH stroke scale 2.     Blood pressure (!) 152/99, pulse 73, temperature 98.1 F (36.7 C), resp. rate 20, SpO2 99 %.  Past Medical History:  Diagnosis Date  . Ambulates with cane    straight - uses occasionally  . Anxiety    due to the stroke  . Arthritis   . Back pain    hx of buldging disc  . Complication of anesthesia    took a while for him to wake up after previous anesthesia  . Diabetes mellitus without complication ([place])   . Family history of adverse reaction to anesthesia    "sometimes mom has a hard time waking up"  . High cholesterol    takes Zocor daily  . Hypertension    takes Benazepril and HCTZ  daily  . Joint pain   . Joint swelling   . Memory impairment    occassional - from stroke  . Myocardial infarction ([place]) 1987  . Pneumonia    hx of-80's  . Shortness of breath dyspnea  do to pain  . Sleep apnea    never had a sleep study,but states Dr. Cindie Laroche says he has it  . Slurred speech   . Stroke ([place]) 08/2013   7 mini-strokes, last stroke 2017  . TIA (transient ischemic attack) 2014   x 7   . Urinary frequency     Past Surgical History:  Procedure Laterality Date  . ABDOMINAL EXPOSURE N/A 06/16/2018   Procedure: ABDOMINAL EXPOSURE;  Surgeon: Rosetta Posner, MD;  Location: [place];  Service: Vascular;  Laterality: N/A;  . ANKLE SURGERY  2008   left ankle-otif-[facility]  .  ANTERIOR LUMBAR FUSION Bilateral 06/16/2018   Procedure: LUMBAR 4-5 LUMBAR 5-SACRUM 1 ANTERIOR LUMBAR INTERBODY FUSION WITH INSTRUMENTATION AND ALLOGRAFT;  Surgeon: Phylliss Bob, MD;  Location: [place];  Service: Orthopedics;  Laterality: Bilateral;  . BACK SURGERY    . HEMATOMA EVACUATION Left 08/13/2018   Procedure: EVACUATION HEMATOMA LEFT ABDOMINAL WALL;  Surgeon: Rosetta Posner, MD;  [facility];  Service: Vascular;  Laterality: Left;  . IR RADIOLOGIST EVAL & MGMT  07/27/2018  . IR US GUIDE BX ASP/DRAIN  07/14/2018  . JOINT REPLACEMENT     both hips replaced   . LUMBAR LAMINECTOMY/DECOMPRESSION MICRODISCECTOMY Right 10/17/2014   Procedure: LUMBAR LAMINECTOMY/DECOMPRESSION MICRODISCECTOMY 2 LEVELS;  Surgeon: [name], MD;  Location: [facility];  Service: Neurosurgery;  Laterality: Right;  Right L45 L5S1 laminectomy and foraminotomy  . MASS EXCISION  09/13/2012   Procedure: EXCISION MASS;  Surgeon: Jamesetta So, MD;  Location: [facility];  Service: General;  Laterality: N/A;  . ROBOTIC ASSITED PARTIAL NEPHRECTOMY Left 03/30/2019   Procedure: XI ROBOTIC ASSITED PARTIAL NEPHRECTOMY POSSIBLE RADICAL NEPHRECTOMY;  Surgeon: Ceasar Mons, MD;  Location: [facility];  Service: Urology;  Laterality: Left;  . SHOULDER ARTHROSCOPY WITH ROTATOR CUFF REPAIR Right 04/12/2020   Procedure: right shoulder arthroscopy, debridement, mini open rotator cuff tear repair;  Surgeon: Meredith Pel, MD;  Location: [facility];  Service: Orthopedics;  Laterality: Right;  . TONSILLECTOMY    . TOTAL HIP ARTHROPLASTY Right 03/20/2015  . TOTAL HIP ARTHROPLASTY Right 03/20/2015   Procedure: TOTAL HIP ARTHROPLASTY ANTERIOR APPROACH;  Surgeon: Renette Butters, MD;  Location: [facility];  Service: Orthopedics;  Laterality: Right;  . TOTAL HIP ARTHROPLASTY Left 01/08/2016   Procedure: TOTAL HIP ARTHROPLASTY ANTERIOR APPROACH;  Surgeon: Renette Butters, MD;  Location: [facility];  Service: Orthopedics;  Laterality: Left;     Family History  Problem Relation Age of Onset  . Heart disease Father     Social History:  reports that he has been smoking cigarettes. He has a 11.75 pack-year smoking history. He has never used smokeless tobacco. He reports previous alcohol use. He reports previous drug use. Drug: Cocaine.  Allergies:  Allergies  Allergen Reactions  . Poison Ivy Extract [Poison Ivy Extract] Itching and Rash    Medications: Prior to Admission medications   Medication Sig Start Date End Date Taking? Authorizing Provider  acetaminophen (TYLENOL) 500 MG tablet Take 1,000 mg by mouth every 4 (four) hours as needed for mild pain.   Yes [provider]  amLODipine (NORVASC) 10 MG tablet Take 10 mg by mouth daily.  07/19/19  Yes [provider]  aspirin EC 81 MG tablet Take 81 mg by mouth daily.   Yes [provider]  cholecalciferol (VITAMIN D3) 25 MCG (1000 UNIT) tablet Take 1,000 Units by mouth daily.   Yes [provider]  gabapentin (  NEURONTIN) 300 MG capsule Take 1 capsule (300 mg total) by mouth 3 (three) times daily. Patient taking differently: Take 300 mg by mouth 3 (three) times daily as needed (nerve/joint pain and to help relax).  05/11/20  Yes Meredith Pel, MD  glucose 4 GM chewable tablet Chew 1 tablet by mouth as needed for low blood sugar.   Yes [provider]  labetalol (NORMODYNE) 300 MG tablet Take 150 mg by mouth 2 (two) times daily.  07/19/19  Yes [provider]  lidocaine (LIDODERM) 5 % Place 1 patch onto the skin daily. Remove & Discard patch within 12 hours or as directed by MD Patient taking differently: Place 1 patch onto the skin daily as needed (PAIN). Remove & Discard patch within 12 hours or as directed by MD 07/10/19  Yes Langston Masker B, PA-C  metFORMIN (GLUCOPHAGE) 500 MG tablet Take 500 mg by mouth 2 (two) times daily with a meal.    Yes [provider]  ondansetron (ZOFRAN) 4 MG tablet Take 1 tablet (4 mg  total) by mouth every 8 (eight) hours as needed for nausea or vomiting. 03/30/19  Yes Dancy, Estill Bamberg, PA-C  simvastatin (ZOCOR) 40 MG tablet Take 40 mg by mouth every evening.  07/19/19  Yes [provider]  tiZANidine (ZANAFLEX) 4 MG tablet Take 0.5-1 tablets (2-4 mg total) by mouth 2 (two) times daily as needed for muscle spasms. 05/11/20  Yes Meredith Pel, MD  gabapentin (NEURONTIN) 100 MG capsule Take 1 capsule (100 mg total) by mouth 3 (three) times daily. Patient not taking: Reported on 10/07/2020 06/08/20   Meredith Pel, MD  HYDROcodone-acetaminophen (NORCO/VICODIN) 5-325 MG tablet Take 1 tablet by mouth every 12 (twelve) hours as needed for moderate pain. Patient not taking: Reported on 10/07/2020 07/05/20   Meredith Pel, MD  HYDROcodone-acetaminophen (NORCO/VICODIN) 5-325 MG tablet Take 1 tablet by mouth every 12 (twelve) hours as needed for moderate pain. Patient not taking: Reported on 10/07/2020 07/29/20   Meredith Pel, MD  oxyCODONE-acetaminophen [facility]) 10-325 MG tablet 1 po q 6-8hr prn pain Patient not taking: Reported on 10/07/2020 05/11/20   Meredith Pel, MD  oxyCODONE-acetaminophen (PERCOCET) 5-325 MG tablet Take 1 tablet by mouth every 8 (eight) hours as needed for severe pain. Patient not taking: Reported on 10/07/2020 05/25/20 05/25/21  Meredith Pel, MD    Scheduled Meds: .  stroke: mapping our early stages of recovery book   Does not apply Once  . aspirin EC  162 mg Oral Daily  . clopidogrel  75 mg Oral Q breakfast  . insulin aspart  0-15 Units Subcutaneous TID WC  . insulin aspart  0-5 Units Subcutaneous QHS  . simvastatin  40 mg Oral QPM   Continuous Infusions: PRN Meds:.acetaminophen **OR** acetaminophen (TYLENOL) oral liquid 160 mg/5 mL **OR** acetaminophen     Results for orders placed or performed during the hospital encounter of 10/07/20 (from the past 48 hour(s))  CBG monitoring, ED     Status: Abnormal   Collection  Time: 10/07/20  9:04 AM  Result Value Ref Range   Glucose-Capillary 173 (H) 70 - 99 mg/dL    Comment: Glucose reference range applies only to samples taken after fasting for at least 8 hours.  Ethanol     Status: None   Collection Time: 10/07/20  9:06 AM  Result Value Ref Range   Alcohol, Ethyl (B) <10 <10 mg/dL    Comment: (NOTE) Lowest detectable limit for serum  alcohol is 10 mg/dL.  For medical purposes only. Performed at [facility], [address] [place], [place] [address]   Protime-INR     Status: None   Collection Time: 10/07/20  9:06 AM  Result Value Ref Range   Prothrombin Time 13.0 11.4 - 15.2 seconds   INR 1.0 0.8 - 1.2    Comment: (NOTE) INR goal varies based on device and disease states. Performed at [facility], [address], [place], [place] [address]   APTT     Status: None   Collection Time: 10/07/20  9:06 AM  Result Value Ref Range   aPTT 31 24 - 36 seconds    Comment: Performed at [facility], [address], [place], [place] [address]  CBC     Status: None   Collection Time: 10/07/20  9:06 AM  Result Value Ref Range   WBC 6.1 4.0 - 10.5 K/uL   RBC 4.52 4.22 - 5.81 MIL/uL   Hemoglobin 14.0 13.0 - 17.0 g/dL   HCT 40.6 39 - 52 %   MCV 89.8 80.0 - 100.0 fL   MCH 31.0 26.0 - 34.0 pg   MCHC 34.5 30.0 - 36.0 g/dL   RDW 13.9 11.5 - 15.5 %   Platelets 256 150 - 400 K/uL   nRBC 0.0 0.0 - 0.2 %    Comment: Performed at [facility], [address] [place], [place] [address]  Differential     Status: None   Collection Time: 10/07/20  9:06 AM  Result Value Ref Range   Neutrophils Relative % 66 %   Neutro Abs 4.0 1.7 - 7.7 K/uL   Lymphocytes Relative 26 %   Lymphs Abs 1.6 0.7 - 4.0 K/uL   Monocytes Relative 6 %   Monocytes Absolute 0.4 0.1 - 1.0 K/uL   Eosinophils Relative 2 %   Eosinophils Absolute 0.1 0.0 - 0.5 K/uL   Basophils Relative 0 %   Basophils Absolute 0.0 0.0 - 0.1 K/uL   Immature Granulocytes 0 %   Abs Immature Granulocytes 0.01 0.00 -  0.07 K/uL    Comment: Performed at [facility], [address] [place], [place] [address]  Comprehensive metabolic panel     Status: Abnormal   Collection Time: 10/07/20  9:06 AM  Result Value Ref Range   Sodium 136 135 - 145 mmol/L   Potassium 4.2 3.5 - 5.1 mmol/L   Chloride 102 98 - 111 mmol/L   CO2 25 22 - 32 mmol/L   Glucose, Bld 169 (H) 70 - 99 mg/dL    Comment: Glucose reference range applies only to samples taken after fasting for at least 8 hours.   BUN 25 (H) 8 - 23 mg/dL   Creatinine, Ser 2.06 (H) 0.61 - 1.24 mg/dL   Calcium 10.1 8.9 - 10.3 mg/dL   Total Protein 7.9 6.5 - 8.1 g/dL   Albumin 4.2 3.5 - 5.0 g/dL   AST 20 15 - 41 U/L   ALT 25 0 - 44 U/L   Alkaline Phosphatase 79 38 - 126 U/L   Total Bilirubin 0.6 0.3 - 1.2 mg/dL   GFR, Estimated 36 (L) >60 mL/min    Comment: (NOTE) Calculated using the CKD-EPI Creatinine Equation (2021)    Anion gap 9 5 - 15    Comment: Performed at [facility], [address] [place], [place] [address]  Respiratory Panel by RT PCR (Flu A&B, Covid) - Nasopharyngeal Swab     Status: None   Collection  Time: 10/07/20  9:22 AM   Specimen: Nasopharyngeal Swab  Result Value Ref Range   SARS Coronavirus 2 by RT PCR NEGATIVE NEGATIVE    Comment: (NOTE) SARS-CoV-2 target nucleic acids are NOT DETECTED.  The SARS-CoV-2 RNA is generally detectable in upper respiratoy specimens during the acute phase of infection. The lowest concentration of SARS-CoV-2 viral copies this assay can detect is 131 copies/mL. A negative result does not preclude SARS-Cov-2 infection and should not be used as the sole basis for treatment or other patient management decisions. A negative result may occur with  improper specimen collection/handling, submission of specimen other than nasopharyngeal swab, presence of viral mutation(s) within the areas targeted by this assay, and inadequate number of viral copies (<131 copies/mL). A negative result must be combined with  clinical observations, patient history, and epidemiological information. The expected result is Negative.  Fact Sheet for Patients:  PinkCheek.be  Fact Sheet for Healthcare Providers:  GravelBags.it  This test is no t yet approved or cleared by the [place] FDA and  has been authorized for detection and/or diagnosis of SARS-CoV-2 by FDA under an Emergency Use Authorization (EUA). This EUA will remain  in effect (meaning this test can be used) for the duration of the COVID-19 declaration under Section 564(b)(1) of the Act, 21 U.S.C. section 360bbb-3(b)(1), unless the authorization is terminated or revoked sooner.     Influenza A by PCR NEGATIVE NEGATIVE   Influenza B by PCR NEGATIVE NEGATIVE    Comment: (NOTE) The Xpert Xpress SARS-CoV-2/FLU/RSV assay is intended as an aid in  the diagnosis of influenza from Nasopharyngeal swab specimens and  should not be used as a sole basis for treatment. Nasal washings and  aspirates are unacceptable for Xpert Xpress SARS-CoV-2/FLU/RSV  testing.  Fact Sheet for Patients: PinkCheek.be  Fact Sheet for Healthcare Providers: GravelBags.it  This test is not yet approved or cleared by the [place] FDA and  has been authorized for detection and/or diagnosis of SARS-CoV-2 by  FDA under an Emergency Use Authorization (EUA). This EUA will remain  in effect (meaning this test can be used) for the duration of the  Covid-19 declaration under Section 564(b)(1) of the Act, 21  U.S.C. section 360bbb-3(b)(1), unless the authorization is  terminated or revoked. Performed at [facility], [address], [place], [place] [address]   I-stat chem 8, ed     Status: Abnormal   Collection Time: 10/07/20 10:08 AM  Result Value Ref Range   Sodium 141 135 - 145 mmol/L   Potassium 4.3 3.5 - 5.1 mmol/L   Chloride 104 98 - 111 mmol/L   BUN  26 (H) 8 - 23 mg/dL   Creatinine, Ser 2.20 (H) 0.61 - 1.24 mg/dL   Glucose, Bld 160 (H) 70 - 99 mg/dL    Comment: Glucose reference range applies only to samples taken after fasting for at least 8 hours.   Calcium, Ion 1.33 1.15 - 1.40 mmol/L   TCO2 25 22 - 32 mmol/L   Hemoglobin 13.3 13.0 - 17.0 g/dL   HCT 39.0 39 - 52 %  Urine rapid drug screen (hosp performed)     Status: Abnormal   Collection Time: 10/07/20 11:03 AM  Result Value Ref Range   Opiates NONE DETECTED NONE DETECTED   Cocaine POSITIVE (A) NONE DETECTED   Benzodiazepines NONE DETECTED NONE DETECTED   Amphetamines NONE DETECTED NONE DETECTED   Tetrahydrocannabinol NONE DETECTED NONE DETECTED   Barbiturates NONE DETECTED NONE DETECTED  Comment: (NOTE) DRUG SCREEN FOR MEDICAL PURPOSES ONLY.  IF CONFIRMATION IS NEEDED FOR ANY PURPOSE, NOTIFY LAB WITHIN 5 DAYS.  LOWEST DETECTABLE LIMITS FOR URINE DRUG SCREEN Drug Class                     Cutoff (ng/mL) Amphetamine and metabolites    1000 Barbiturate and metabolites    200 Benzodiazepine                 035 Tricyclics and metabolites     300 Opiates and metabolites        300 Cocaine and metabolites        300 THC                            50 Performed at [facility], [address] [place], [place] [address]   Urinalysis, Routine w reflex microscopic Urine, Clean Catch     Status: Abnormal   Collection Time: 10/07/20 11:03 AM  Result Value Ref Range   Color, Urine YELLOW YELLOW   APPearance CLEAR CLEAR   Specific Gravity, Urine 1.019 1.005 - 1.030   pH 5.0 5.0 - 8.0   Glucose, UA NEGATIVE NEGATIVE mg/dL   Hgb urine dipstick NEGATIVE NEGATIVE   Bilirubin Urine NEGATIVE NEGATIVE   Ketones, ur NEGATIVE NEGATIVE mg/dL   Protein, ur 100 (A) NEGATIVE mg/dL   Nitrite NEGATIVE NEGATIVE   Leukocytes,Ua NEGATIVE NEGATIVE   RBC / HPF 0-5 0 - 5 RBC/hpf   WBC, UA 0-5 0 - 5 WBC/hpf   Bacteria, UA RARE (A) NONE SEEN   Squamous Epithelial / LPF 0-5 0 - 5   Mucus  PRESENT     Comment: Performed at [facility], [address] [place], [place] [address]  CBG monitoring, ED     Status: None   Collection Time: 10/07/20 11:52 AM  Result Value Ref Range   Glucose-Capillary 92 70 - 99 mg/dL    Comment: Glucose reference range applies only to samples taken after fasting for at least 8 hours.  HIV Antibody (routine testing w rflx)     Status: None   Collection Time: 10/07/20 12:15 PM  Result Value Ref Range   HIV Screen 4th Generation wRfx Non Reactive Non Reactive    Comment: Performed at [facility], [place] [address], [place], [place] [address]  CBG monitoring, ED     Status: Abnormal   Collection Time: 10/07/20  4:53 PM  Result Value Ref Range   Glucose-Capillary 119 (H) 70 - 99 mg/dL    Comment: Glucose reference range applies only to samples taken after fasting for at least 8 hours.  Glucose, capillary     Status: Abnormal   Collection Time: 10/07/20  9:53 PM  Result Value Ref Range   Glucose-Capillary 116 (H) 70 - 99 mg/dL    Comment: Glucose reference range applies only to samples taken after fasting for at least 8 hours.  Hemoglobin A1c     Status: Abnormal   Collection Time: 10/08/20  6:27 AM  Result Value Ref Range   Hgb A1c MFr Bld 6.8 (H) 4.8 - 5.6 %    Comment: (NOTE) Pre diabetes:          5.7%-6.4%  Diabetes:              >6.4%  Glycemic control for   <7.0% adults with diabetes    Mean Plasma Glucose 148.46  mg/dL    Comment: Performed at [facility], [place] [address] [place], [place] [address]  Lipid panel     Status: Abnormal   Collection Time: 10/08/20  6:28 AM  Result Value Ref Range   Cholesterol 164 0 - 200 mg/dL   Triglycerides 213 (H) <150 mg/dL   HDL 32 (L) >40 mg/dL   Total CHOL/HDL Ratio 5.1 RATIO   VLDL 43 (H) 0 - 40 mg/dL   LDL Cholesterol 89 0 - 99 mg/dL    Comment:        Total Cholesterol/HDL:CHD Risk Coronary Heart Disease Risk Table                     Men   Women  1/2 Average Risk   3.4    3.3  Average Risk       5.0   4.4  2 X Average Risk   9.6   7.1  3 X Average Risk  23.4   11.0        Use the calculated Patient Ratio above and the CHD Risk Table to determine the patient's CHD Risk.        ATP III CLASSIFICATION (LDL):  <100     mg/dL   Optimal  100-129  mg/dL   Near or Above                    Optimal  130-159  mg/dL   Borderline  160-189  mg/dL   High  >190     mg/dL   Very High Performed at [facility], [address] [place], [place] [address]   Glucose, capillary     Status: Abnormal   Collection Time: 10/08/20  7:46 AM  Result Value Ref Range   Glucose-Capillary 126 (H) 70 - 99 mg/dL    Comment: Glucose reference range applies only to samples taken after fasting for at least 8 hours.   Comment 1 Notify RN    Comment 2 Document in Chart   Glucose, capillary     Status: Abnormal   Collection Time: 10/08/20 11:29 AM  Result Value Ref Range   Glucose-Capillary 181 (H) 70 - 99 mg/dL    Comment: Glucose reference range applies only to samples taken after fasting for at least 8 hours.   Comment 1 Notify RN    Comment 2 Document in Chart     Studies/Results:  CAROTID  RIGHT CAROTID ARTERY: There is a minimal amount of intimal thickening/hypoechoic plaque within the right carotid bulb (image 16), unchanged to slightly progressed compared to the 2018 examination, though not resulting in elevated peak systolic velocities within the interrogated course of the right internal carotid artery to suggest a hemodynamically significant stenosis.  RIGHT VERTEBRAL ARTERY:  Antegrade flow  LEFT CAROTID ARTERY: There is a very minimal amount of eccentric echogenic plaque involving the proximal aspect of the left internal carotid artery (image 64), slightly progressed compared to the 2018 examination, though not resulting in elevated peak systolic velocities within the interrogated course of the left internal carotid artery to suggest a hemodynamically  significant stenosis.  LEFT VERTEBRAL ARTERY:  Antegrade flow  Incidental note made of age-indeterminate apparent hypoechoic occlusive thrombus within the incidentally imaged right internal jugular vein (images 20 through 26)  IMPRESSION: 1. Minimal amount of bilateral atherosclerotic plaque, unchanged to slightly progressed compared to the 2018 examination, though not resulting in a hemodynamically significant stenosis within either internal carotid artery. 2.  Age-indeterminate apparent thrombus within the right internal jugular vein. Clinical correlation, including for previous central line placement, is advised. Further evaluation with dedicated right upper extremity venous ultrasound could be performed as indicated.      BRAIN MRA MRI FINDINGS: MRI HEAD FINDINGS  Brain: The diffusion-weighted images demonstrate acute/subacute infarct involving the left side of the body of the corpus callosum. There is some involvement of the adjacent cingulate gyrus. Infarct extends through the body to the splenium of the corpus callosum on the left without crossing midline. Cortical lesions are present. Remote lacunar infarcts are present in the basal ganglia, left greater than right. T2 signal changes are present throughout the area acute/subacute infarction.  Confluent periventricular and subcortical T2 changes have progressed since the prior exam.  Wallerian degeneration is noted in the left cerebral peduncle and pons. Brainstem and cerebellum are otherwise normal.  Vascular: Flow is present in the major intracranial arteries.  Skull and upper cervical spine: The craniocervical junction is normal. Degenerative changes are present cervical spine, most notably at C3-4. Marrow signal is normal.  Sinuses/Orbits: The paranasal sinuses and mastoid air cells are clear. The globes and orbits are within normal limits.  MRA HEAD FINDINGS  Atherosclerotic changes are  present. Pre cavernous right ICA demonstrates 50% stenosis. Less than 50% supra ophthalmic narrowing is present on the right. Approximately 50% stenosis present at the anterior genu on the left. ICA termini are normal. The A1 and M1 segments are normal. The anterior communicating artery is patent.  Mild narrowing is present in the dominant posterior left M2 segment. Moderate segmental stenoses are present in the more posterior M3 division the same branches. Moderate M3 stenosis is present on the right. Tandem stenoses are present in the left ACA with focal occlusion at the level of the corpus callosum consistent with the area of infarct.  The left vertebral artery is dominant. High-grade stenosis is present in the distal right vertebral artery just below the vertebrobasilar junction. PICA origins are not included on the study. Basilar artery is normal. Both posterior cerebral arteries originate from the basilar tip. Moderate stenosis is present at the junction of the left P1 and P2 segment. There is significant attenuation PCA branch vessels bilaterally.  IMPRESSION: 1. Acute/subacute infarct involving the left side of the body of the corpus callosum extending through the splenium of the corpus callosum, left greater than right. 2. Left ACA occlusion consistent with the area of acute/subacute infarction. 3. Moderate stenosis of the dominant posterior left M2 segment. 4. Moderate segmental stenoses in the more posterior left M3 division. 5. 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 6. 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 7. 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       TTE 1. Left ventricular ejection fraction, by estimation, is 40 to 45%. The  left ventricle has mildly decreased function. The left ventricle  demonstrates global hypokinesis. There is  moderate left ventricular  hypertrophy. Left ventricular diastolic  parameters are indeterminate.  2. Right ventricular systolic function is normal. The right ventricular  size is normal. Tricuspid regurgitation signal is inadequate for assessing  PA pressure.  3. There is a trivial pericardial effusion posterior to the left  ventricle.  4. The mitral valve is grossly normal. Trivial mitral valve  regurgitation.  5. The aortic valve is tricuspid. There is moderate calcification of the  aortic valve. Aortic valve regurgitation is not visualized.  6.  The inferior vena cava is normal in size with greater than 50%  respiratory variability, suggesting right atrial pressure of 3 mmHg.        The brain MRI scan is reviewed in person. There is no acute infarct noted on DWI with corresponding reduced signal on ADC scan involving the left corpus callosum. It extends from the body to the splenium on the ipsilateral side. 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 This is or remote lacunar event. No hemorrhage is seen. 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    Ramey Ketcherside A. Merlene Laughter, M.D.  Diplomate, [employer] and Neurology ( Neurology). 10/08/2020, 4:37 PM

## 2020-10-08 NOTE — Progress Notes (Addendum)
PROGRESS NOTE    Raymond Barrera.  SLH:[id number] DOB: 06/21/1961 DOA: 10/07/2020 PCP: Lucia Gaskins, MD   Brief Narrative:  Per HPI: Raymond Myrie. is a 59 y.o. male with medical history significant for TIAs with prior CVA, type 2 diabetes, CKD 3B, dyslipidemia, hypertension, prior MI, sleep apnea, and renal cell carcinoma status post prior nephrectomy who presented to the ED at the urging of his wife for slurred speech that began approximately 7 days prior. He seems to think that his symptoms began approximately around 10/29 and he has been stubborn about presenting to the ED for further evaluation despite knowing that these could be stroke like symptoms. He has also had some difficulty walking with his cane at baseline and he has had some weakness in both of his hands prohibiting some fine motor function. He does not have any new symptoms over the last 24 h, but his symptoms have persisted over the last several days. His speech is normally clear otherwise. He denies any headaches, but does have some intermittent blurry vision. He denies any nausea or vomiting, chest pain, shortness of breath, fevers, or chills. He is noted to have some residual weakness greater on the right side from a CVA on 01/2019. He is also noted to have blood sugar and blood pressure elevations at home, all despite taking his usual home medications.   ED Course: Vital signs demonstrate ongoing elevated blood pressure readings. EKG with LVH and otherwise sinus rhythm. Creatinine is 2.2, per his usual baseline. CT of the head with no acute abnormalities, but prior chronic CVAs and atrophy otherwise noted. His blood glucose is 160. EDP had discussed with neurology Dr. Rory Percy who recommends brain MRI in a.m. and usual work-up with follow-up to neurology in a.m.   Assessment & Plan:   Active Problems:   CVA (cerebral vascular accident) ([place])   Persistent slurred speech secondary to acute left CVA and associated ACA  occlusion -History of prior CVA/TIAs noted, but head CT negative -MRI with significant findings as noted below -2D echocardiogram to be assessed with prior in 04/2019 with LVEF 50-55% and moderate LVH at that time -Repeat with LVEF 40% and global hypokinesis -Fasting lipid profile with LDL 89 -Hemoglobin A1c 6.8% -PT/OT evaluation with no recommendations at this time -Continue aspirin and statin -Tolerating diet -Appreciate Neurology consultation in light of MRI findings  Type 2 diabetes with hyperglycemia -Carb modified diet -SSI -Hold home medications -A1c 6.8%  Dyslipidemia -Continue statin with LDL 89  Hypertension -Hold home blood pressure agents and allow for permissive hypertension -Labetalol as needed for blood pressure greater than 200/100  OSA -Does not use CPAP at home  CKD stage IIIb -Prior nephrectomy noted for renal cell carcinoma -Monitor labs -Currently stable and near baseline   DVT prophylaxis:SCDs Code Status: Full Family Communication: Discussed with wife on phone 11/8 Disposition Plan:  Status is: Inpatient  Remains inpatient appropriate because:Ongoing diagnostic testing needed not appropriate for outpatient work up   Dispo: The patient is from: Home              Anticipated d/c is to: Home              Anticipated d/c date is: 1 day              Patient currently is not medically stable to d/c. Neurology evaluation and recommendations pending.   Consultants:   Neurology  Procedures:   See below  Antimicrobials:   None  Subjective: Patient seen and evaluated today with no new acute complaints or concerns. No acute concerns or events noted overnight.  Objective: Vitals:   10/07/20 2151 10/08/20 0048 10/08/20 0535 10/08/20 1704  BP:  (!) 148/85 (!) 152/99 (!) 157/100  Pulse:  72 73 72  Resp:  20 20 18   Temp: 97.8 F (36.6 C) 98.3 F (36.8 C) 98.1 F (36.7 C) 98.2 F (36.8 C)  TempSrc: Oral Oral    SpO2:  98% 99%  100%    Intake/Output Summary (Last 24 hours) at 10/08/2020 1751 Last data filed at 10/08/2020 0700 Gross per 24 hour  Intake 360 ml  Output 1625 ml  Net -1265 ml   There were no vitals filed for this visit.  Examination:  General exam: Appears calm and comfortable  Respiratory system: Clear to auscultation. Respiratory effort normal. Cardiovascular system: S1 & S2 heard, RRR.  Gastrointestinal system: Abdomen is nondistended, soft and nontender. Central nervous system: Alert and awake Extremities: No edema Skin: No rashes, lesions or ulcers Psychiatry: Judgement and insight appear normal. Mood & affect appropriate.     Data Reviewed: I have personally reviewed following labs and imaging studies  CBC: Recent Labs  Lab 10/07/20 0906 10/07/20 1008  WBC 6.1  --   NEUTROABS 4.0  --   HGB 14.0 13.3  HCT 40.6 39.0  MCV 89.8  --   PLT 256  --    Basic Metabolic Panel: Recent Labs  Lab 10/07/20 0906 10/07/20 1008  NA 136 141  K 4.2 4.3  CL 102 104  CO2 25  --   GLUCOSE 169* 160*  BUN 25* 26*  CREATININE 2.06* 2.20*  CALCIUM 10.1  --    GFR: CrCl cannot be calculated (Unknown ideal weight.). Liver Function Tests: Recent Labs  Lab 10/07/20 0906  AST 20  ALT 25  ALKPHOS 79  BILITOT 0.6  PROT 7.9  ALBUMIN 4.2   No results for input(s): LIPASE, AMYLASE in the last 168 hours. No results for input(s): AMMONIA in the last 168 hours. Coagulation Profile: Recent Labs  Lab 10/07/20 0906  INR 1.0   Cardiac Enzymes: No results for input(s): CKTOTAL, CKMB, CKMBINDEX, TROPONINI in the last 168 hours. BNP (last 3 results) No results for input(s): PROBNP in the last 8760 hours. HbA1C: Recent Labs    10/08/20 0627  HGBA1C 6.8*   CBG: Recent Labs  Lab 10/07/20 2153 10/08/20 0746 10/08/20 1129 10/08/20 1659 10/08/20 1721  GLUCAP 116* 126* 181* 96 95   Lipid Profile: Recent Labs    10/08/20 0628  CHOL 164  HDL 32*  LDLCALC 89  TRIG 213*  CHOLHDL  5.1   Thyroid Function Tests: No results for input(s): TSH, T4TOTAL, FREET4, T3FREE, THYROIDAB in the last 72 hours. Anemia Panel: No results for input(s): VITAMINB12, FOLATE, FERRITIN, TIBC, IRON, RETICCTPCT in the last 72 hours. Sepsis Labs: No results for input(s): PROCALCITON, LATICACIDVEN in the last 168 hours.  Recent Results (from the past 240 hour(s))  Respiratory Panel by RT PCR (Flu A&B, Covid) - Nasopharyngeal Swab     Status: None   Collection Time: 10/07/20  9:22 AM   Specimen: Nasopharyngeal Swab  Result Value Ref Range Status   SARS Coronavirus 2 by RT PCR NEGATIVE NEGATIVE Final    Comment: (NOTE) SARS-CoV-2 target nucleic acids are NOT DETECTED.  The SARS-CoV-2 RNA is generally detectable in upper respiratoy specimens during the acute phase of infection. The lowest concentration of SARS-CoV-2 viral copies this assay  can detect is 131 copies/mL. A negative result does not preclude SARS-Cov-2 infection and should not be used as the sole basis for treatment or other patient management decisions. A negative result may occur with  improper specimen collection/handling, submission of specimen other than nasopharyngeal swab, presence of viral mutation(s) within the areas targeted by this assay, and inadequate number of viral copies (<131 copies/mL). A negative result must be combined with clinical observations, patient history, and epidemiological information. The expected result is Negative.  Fact Sheet for Patients:  PinkCheek.be  Fact Sheet for Healthcare Providers:  GravelBags.it  This test is no t yet approved or cleared by the [place] FDA and  has been authorized for detection and/or diagnosis of SARS-CoV-2 by FDA under an Emergency Use Authorization (EUA). This EUA will remain  in effect (meaning this test can be used) for the duration of the COVID-19 declaration under Section 564(b)(1) of the  Act, 21 U.S.C. section 360bbb-3(b)(1), unless the authorization is terminated or revoked sooner.     Influenza A by PCR NEGATIVE NEGATIVE Final   Influenza B by PCR NEGATIVE NEGATIVE Final    Comment: (NOTE) The Xpert Xpress SARS-CoV-2/FLU/RSV assay is intended as an aid in  the diagnosis of influenza from Nasopharyngeal swab specimens and  should not be used as a sole basis for treatment. Nasal washings and  aspirates are unacceptable for Xpert Xpress SARS-CoV-2/FLU/RSV  testing.  Fact Sheet for Patients: PinkCheek.be  Fact Sheet for Healthcare Providers: GravelBags.it  This test is not yet approved or cleared by the [place] FDA and  has been authorized for detection and/or diagnosis of SARS-CoV-2 by  FDA under an Emergency Use Authorization (EUA). This EUA will remain  in effect (meaning this test can be used) for the duration of the  Covid-19 declaration under Section 564(b)(1) of the Act, 21  U.S.C. section 360bbb-3(b)(1), unless the authorization is  terminated or revoked. Performed at [facility], [address], [place], [place] [address]          Radiology Studies: CT Head Wo Contrast  Result Date: 10/07/2020 CLINICAL DATA:  Neuro deficit, acute, stroke suspected. Additional provided: Patient reports slurred speech, trouble forming words, abnormal gait and left-sided weakness. Symptoms since October 31st. EXAM: CT HEAD WITHOUT CONTRAST TECHNIQUE: Contiguous axial images were obtained from the base of the skull through the vertex without intravenous contrast. COMPARISON:  Brain MRI 04/19/2019. FINDINGS: Brain: Mild generalized cerebral atrophy. Redemonstrated chronic lacunar infarcts within the bilateral basal ganglia. Background moderate ill-defined hypoattenuation within the cerebral white matter is nonspecific, but compatible with chronic small vessel ischemic disease. There is no acute intracranial  hemorrhage. No demarcated cortical infarct. No extra-axial fluid collection. No evidence of intracranial mass. No midline shift. Vascular: No hyperdense vessel.  Atherosclerotic calcifications. Skull: Normal. Negative for fracture or focal lesion. Sinuses/Orbits: Visualized orbits show no acute finding. No significant paranasal sinus disease at the imaged levels. IMPRESSION: No evidence of acute intracranial abnormality. Redemonstrated chronic bilateral basal ganglia lacunar infarcts. Background mild cerebral atrophy and moderate chronic small vessel ischemic disease. Electronically Signed   By: Kellie Simmering DO   On: 10/07/2020 09:53   MR ANGIO HEAD WO CONTRAST  Result Date: 10/08/2020 CLINICAL DATA:  Neuro deficit, acute stroke suspected. Weakness and slurred speech. Slurred speech began 7 days prior to presentation. EXAM: MRI HEAD WITHOUT CONTRAST MRA HEAD WITHOUT CONTRAST TECHNIQUE: Multiplanar, multiecho pulse sequences of the brain and surrounding structures were obtained without intravenous contrast. Angiographic images of the  head were obtained using MRA technique without contrast. COMPARISON:  MR head without contrast 04/19/2019 FINDINGS: MRI HEAD FINDINGS Brain: The diffusion-weighted images demonstrate acute/subacute infarct involving the left side of the body of the corpus callosum. There is some involvement of the adjacent cingulate gyrus. Infarct extends through the body to the splenium of the corpus callosum on the left without crossing midline. Cortical lesions are present. Remote lacunar infarcts are present in the basal ganglia, left greater than right. T2 signal changes are present throughout the area acute/subacute infarction. Confluent periventricular and subcortical T2 changes have progressed since the prior exam. Wallerian degeneration is noted in the left cerebral peduncle and pons. Brainstem and cerebellum are otherwise normal. Vascular: Flow is present in the major intracranial arteries.  Skull and upper cervical spine: The craniocervical junction is normal. Degenerative changes are present cervical spine, most notably at C3-4. Marrow signal is normal. Sinuses/Orbits: The paranasal sinuses and mastoid air cells are clear. The globes and orbits are within normal limits. MRA HEAD FINDINGS Atherosclerotic changes are present. Pre cavernous right ICA demonstrates 50% stenosis. Less than 50% supra ophthalmic narrowing is present on the right. Approximately 50% stenosis present at the anterior genu on the left. ICA termini are normal. The A1 and M1 segments are normal. The anterior communicating artery is patent. Mild narrowing is present in the dominant posterior left M2 segment. Moderate segmental stenoses are present in the more posterior M3 division the same branches. Moderate M3 stenosis is present on the right. Tandem stenoses are present in the left ACA with focal occlusion at the level of the corpus callosum consistent with the area of infarct. The left vertebral artery is dominant. High-grade stenosis is present in the distal right vertebral artery just below the vertebrobasilar junction. PICA origins are not included on the study. Basilar artery is normal. Both posterior cerebral arteries originate from the basilar tip. Moderate stenosis is present at the junction of the left P1 and P2 segment. There is significant attenuation PCA branch vessels bilaterally. IMPRESSION: 1. Acute/subacute infarct involving the left side of the body of the corpus callosum extending through the splenium of the corpus callosum, left greater than right. 2. Left ACA occlusion consistent with the area of acute/subacute infarction. 3. Moderate stenosis of the dominant posterior left M2 segment. 4. Moderate segmental stenoses in the more posterior left M3 division. 5. 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 6. 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 7.  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 These results will be called to the ordering clinician or representative by the Radiologist Assistant, and communication documented in the PACS or [employer]. Electronically Signed   By: San Morelle M.D.   On: 10/08/2020 09:26   MR BRAIN WO CONTRAST  Result Date: 10/08/2020 CLINICAL DATA:  Neuro deficit, acute stroke suspected. Weakness and slurred speech. Slurred speech began 7 days prior to presentation. EXAM: MRI HEAD WITHOUT CONTRAST MRA HEAD WITHOUT CONTRAST TECHNIQUE: Multiplanar, multiecho pulse sequences of the brain and surrounding structures were obtained without intravenous contrast. Angiographic images of the head were obtained using MRA technique without contrast. COMPARISON:  MR head without contrast 04/19/2019 FINDINGS: MRI HEAD FINDINGS Brain: The diffusion-weighted images demonstrate acute/subacute infarct involving the left side of the body of the corpus callosum. There is some involvement of the adjacent cingulate gyrus. Infarct extends through the body to the splenium of the corpus callosum on the left without crossing  midline. Cortical lesions are present. Remote lacunar infarcts are present in the basal ganglia, left greater than right. T2 signal changes are present throughout the area acute/subacute infarction. Confluent periventricular and subcortical T2 changes have progressed since the prior exam. Wallerian degeneration is noted in the left cerebral peduncle and pons. Brainstem and cerebellum are otherwise normal. Vascular: Flow is present in the major intracranial arteries. Skull and upper cervical spine: The craniocervical junction is normal. Degenerative changes are present cervical spine, most notably at C3-4. Marrow signal is normal. Sinuses/Orbits: The paranasal sinuses and mastoid air cells are clear. The globes and orbits are within normal limits. MRA HEAD FINDINGS  Atherosclerotic changes are present. Pre cavernous right ICA demonstrates 50% stenosis. Less than 50% supra ophthalmic narrowing is present on the right. Approximately 50% stenosis present at the anterior genu on the left. ICA termini are normal. The A1 and M1 segments are normal. The anterior communicating artery is patent. Mild narrowing is present in the dominant posterior left M2 segment. Moderate segmental stenoses are present in the more posterior M3 division the same branches. Moderate M3 stenosis is present on the right. Tandem stenoses are present in the left ACA with focal occlusion at the level of the corpus callosum consistent with the area of infarct. The left vertebral artery is dominant. High-grade stenosis is present in the distal right vertebral artery just below the vertebrobasilar junction. PICA origins are not included on the study. Basilar artery is normal. Both posterior cerebral arteries originate from the basilar tip. Moderate stenosis is present at the junction of the left P1 and P2 segment. There is significant attenuation PCA branch vessels bilaterally. IMPRESSION: 1. Acute/subacute infarct involving the left side of the body of the corpus callosum extending through the splenium of the corpus callosum, left greater than right. 2. Left ACA occlusion consistent with the area of acute/subacute infarction. 3. Moderate stenosis of the dominant posterior left M2 segment. 4. Moderate segmental stenoses in the more posterior left M3 division. 5. 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 6. 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 7. 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 These results will be called to the ordering clinician or representative by the Radiologist Assistant, and communication documented in the PACS or [employer]. Electronically Signed   By: San Morelle M.D.    On: 10/08/2020 09:26   US Carotid Bilateral (at [facility] and [facility] only)  Result Date: 10/08/2020 CLINICAL DATA:  Stroke. Syncopal episode. History of hypertension, hyperlipidemia, diabetes and smoking. EXAM: BILATERAL CAROTID DUPLEX ULTRASOUND TECHNIQUE: Pearline Cables scale imaging, color Doppler and duplex ultrasound were performed of bilateral carotid and vertebral arteries in the neck. COMPARISON:  Carotid Doppler ultrasound-02/02/2017 FINDINGS: Criteria: Quantification of carotid stenosis is based on velocity parameters that correlate the residual internal carotid diameter with NASCET-based stenosis levels, using the diameter of the distal internal carotid lumen as the denominator for stenosis measurement. The following velocity measurements were obtained: RIGHT ICA: 56/24 cm/sec CCA: 11/9 cm/sec SYSTOLIC ICA/CCA RATIO:  1.8 ECA: 58 cm/sec LEFT ICA: 35/19 cm/sec CCA: 14/78 cm/sec SYSTOLIC ICA/CCA RATIO:  0.7 ECA: 69 cm/sec RIGHT CAROTID ARTERY: There is a minimal amount of intimal thickening/hypoechoic plaque within the right carotid bulb (image 16), unchanged to slightly progressed compared to the 2018 examination, though not resulting in elevated peak systolic velocities within the interrogated course of the right internal carotid artery to suggest a hemodynamically significant stenosis. RIGHT VERTEBRAL ARTERY:  Antegrade flow LEFT CAROTID ARTERY: There is a very minimal amount of eccentric echogenic plaque involving the proximal aspect of the left internal carotid artery (image 64), slightly progressed compared to the 2018 examination, though not resulting in elevated peak systolic velocities within the interrogated course of the left internal carotid artery to suggest a hemodynamically significant stenosis. LEFT VERTEBRAL ARTERY:  Antegrade flow Incidental note made of age-indeterminate apparent hypoechoic occlusive thrombus within the incidentally imaged right internal jugular vein (images 20 through 26) IMPRESSION:  1. Minimal amount of bilateral atherosclerotic plaque, unchanged to slightly progressed compared to the 2018 examination, though not resulting in a hemodynamically significant stenosis within either internal carotid artery. 2. Age-indeterminate apparent thrombus within the right internal jugular vein. Clinical correlation, including for previous central line placement, is advised. Further evaluation with dedicated right upper extremity venous ultrasound could be performed as indicated. Electronically Signed   By: Sandi Mariscal M.D.   On: 10/08/2020 09:47   ECHOCARDIOGRAM COMPLETE  Result Date: 10/08/2020    ECHOCARDIOGRAM REPORT   Patient Name:   Raymond Arcidiacono. Date of Exam: 10/08/2020 Medical Rec #:  [id number]          Height:       78.0 in Accession #:    [id number]         Weight:       265.4 lb Date of Birth:  1960-12-26          BSA:          2.546 m Patient Age:    49 years           BP:           152/99 mmHg Patient Gender: M                  HR:           73 bpm. Exam Location:  [facility] Procedure: 2D Echo Indications:    Stroke 434.91 / I163.9  History:        Patient has prior history of Echocardiogram examinations, most                 recent 04/19/2019. TIA and Stroke, Signs/Symptoms:Syncope; Risk                 Factors:Dyslipidemia, Hypertension, Current Smoker and Diabetes.                 Cocaine Abuse.  Sonographer:    Leavy Cella RDCS (AE) Referring Phys: [id number] Royanne Foots Emm[place]  1. Left ventricular ejection fraction, by estimation, is 40 to 45%. The left ventricle has mildly decreased function. The left ventricle demonstrates global hypokinesis. There is moderate left ventricular hypertrophy. Left ventricular diastolic parameters are indeterminate.  2. Right ventricular systolic function is normal. The right ventricular size is normal. Tricuspid regurgitation signal is inadequate for assessing PA pressure.  3. There is a trivial pericardial effusion posterior to the left  ventricle.  4. The mitral valve is grossly normal. Trivial mitral valve regurgitation.  5. The aortic valve is tricuspid. There is moderate calcification of the aortic valve. Aortic valve regurgitation is not visualized.  6. The inferior vena cava is normal in size with greater than 50% respiratory variability, suggesting right atrial pressure of 3 mmHg. FINDINGS  Left Ventricle: Left ventricular ejection fraction, by estimation, is 40 to 45%. The left ventricle has mildly decreased function. The left ventricle demonstrates global hypokinesis. The left ventricular internal cavity  size was normal in size. There is  moderate left ventricular hypertrophy. Left ventricular diastolic parameters are indeterminate. Right Ventricle: The right ventricular size is normal. No increase in right ventricular wall thickness. Right ventricular systolic function is normal. Tricuspid regurgitation signal is inadequate for assessing PA pressure. Left Atrium: Left atrial size was normal in size. Right Atrium: Right atrial size was normal in size. Pericardium: Trivial pericardial effusion is present. The pericardial effusion is posterior to the left ventricle. Mitral Valve: The mitral valve is grossly normal. There is mild calcification of the mitral valve leaflet(s). Mild mitral annular calcification. Trivial mitral valve regurgitation. Tricuspid Valve: The tricuspid valve is grossly normal. Tricuspid valve regurgitation is trivial. Aortic Valve: The aortic valve is tricuspid. There is moderate calcification of the aortic valve. There is mild aortic valve annular calcification. Aortic valve regurgitation is not visualized. Pulmonic Valve: The pulmonic valve was grossly normal. Pulmonic valve regurgitation is trivial. Aorta: The aortic root is normal in size and structure. Venous: The inferior vena cava is normal in size with greater than 50% respiratory variability, suggesting right atrial pressure of 3 mmHg. IAS/Shunts: No atrial  level shunt detected by color flow Doppler.  LEFT VENTRICLE PLAX 2D LVIDd:         4.47 cm  Diastology LVIDs:         3.54 cm  LV e' medial:    5.66 cm/s LV PW:         2.11 cm  LV E/e' medial:  6.7 LV IVS:        1.67 cm  LV e' lateral:   4.46 cm/s LVOT diam:     2.10 cm  LV E/e' lateral: 8.5 LVOT Area:     3.46 cm  RIGHT VENTRICLE RV S prime:     13.10 cm/s TAPSE (M-mode): 3.2 cm LEFT ATRIUM             Index       RIGHT ATRIUM           Index LA diam:        4.60 cm 1.81 cm/m  RA Area:     18.20 cm LA Vol (A2C):   80.7 ml 31.70 ml/m RA Volume:   49.10 ml  19.28 ml/m LA Vol (A4C):   65.6 ml 25.76 ml/m LA Biplane Vol: 74.5 ml 29.26 ml/m   AORTA Ao Root diam: 3.50 cm MITRAL VALVE MV Area (PHT): 2.66 cm    SHUNTS MV Decel Time: 285 msec    Systemic Diam: 2.10 cm MV E velocity: 37.70 cm/s MV A velocity: 65.10 cm/s MV E/A ratio:  0.58 Rozann Lesches MD Electronically signed by Rozann Lesches MD Signature Date/Time: 10/08/2020/11:58:20 AM    Final         Scheduled Meds: .  stroke: mapping our early stages of recovery book   Does not apply Once  . aspirin EC  162 mg Oral Daily  . clopidogrel  75 mg Oral Q breakfast  . insulin aspart  0-15 Units Subcutaneous TID WC  . insulin aspart  0-5 Units Subcutaneous QHS  . simvastatin  40 mg Oral QPM    LOS: 0 days    Time spent: 35 minutes    Raymond Quiroa Darleen Crocker, DO [facility]  If 7PM-7AM, please contact night-coverage [email or web address] 10/08/2020, 5:51 PM

## 2020-10-09 ENCOUNTER — Inpatient Hospital Stay (HOSPITAL_COMMUNITY): Payer: Medicaid Other

## 2020-10-09 DIAGNOSIS — I639 Cerebral infarction, unspecified: Secondary | ICD-10-CM | POA: Diagnosis not present

## 2020-10-09 LAB — GLUCOSE, CAPILLARY
Glucose-Capillary: 112 mg/dL — ABNORMAL HIGH (ref 70–99)
Glucose-Capillary: 82 mg/dL (ref 70–99)

## 2020-10-09 MED ORDER — CLOPIDOGREL BISULFATE 75 MG PO TABS: 75.0000 mg | ORAL_TABLET | Freq: Every day | ORAL | 4 refills | Status: AC

## 2020-10-09 MED ORDER — ASPIRIN EC 325 MG PO TBEC: 325.0000 mg | DELAYED_RELEASE_TABLET | Freq: Every day | ORAL | 0 refills | Status: AC

## 2020-10-09 NOTE — Discharge Summary (Signed)
Physician Discharge Summary  Raymond Barrera. HAL:[id number] DOB: May 27, 1961 DOA: 10/07/2020  PCP: Lucia Gaskins, MD  Admit date: 10/07/2020  Discharge date: 10/09/2020  Admitted From:Home  Disposition:  Home  Recommendations for Outpatient Follow-up:  1. Follow up with PCP in 1-2 weeks 2. Continue on full dose aspirin as well as Plavix as prescribed for 3 months and then Plavix only thereafter as prescribed 3. Continue on statin as prior 4. Carefully manage blood glucose with PCP recommendations  Home Health: None  Equipment/Devices: None  Discharge Condition: Stable  CODE STATUS: Full  Diet recommendation: Heart Healthy/carb modified  Brief/Interim Summary: Per HPI: Raymond Barrerais a 59 y.o.malewith medical history significant forTIAs with prior CVA, type 2 diabetes, CKD 3B, dyslipidemia, hypertension, prior MI, sleep apnea, and renal cell carcinoma status post prior nephrectomy who presented to the ED at the urging of his wife for slurred speech that began approximately 7 days prior. He seems to think that his symptoms began approximately around 10/29 and he has been stubborn about presenting to the ED for further evaluation despite knowing that these could be stroke like symptoms. He has also had some difficulty walking with his cane at baseline and he has had some weakness in both of his hands prohibiting some fine motor function. He does not have any new symptoms over the last 24 h, but his symptoms have persisted over the last several days. His speech is normally clear otherwise. He denies any headaches, but does have some intermittent blurry vision. He denies any nausea or vomiting, chest pain, shortness of breath, fevers, or chills. He is noted to have some residual weakness greater on the right side from a CVA on 01/2019. He is also noted to have blood sugar and blood pressure elevations at home,all despite taking his usual home medications.  ED Course:Vital  signs demonstrate ongoing elevated blood pressure readings. EKG with LVH and otherwise sinus rhythm. Creatinine is 2.2, per his usual baseline. CT of the head with no acute abnormalities, but prior chronic CVAs and atrophy otherwise noted. His blood glucose is 160. EDP had discussed with neurology Dr. Rory Percy who recommends brain MRI in a.m. and usual work-up with follow-up to neurology in a.m.  -Patient was admitted with acute dysarthria and was noted to have an acute infarct involving the left corpus callosum with ACA occlusion as well as multivessel intracranial occlusive disease.  He actually has no PT requirements or speech requirements and is able to eat without any difficulty.  He has been seen by neurology with plans to maintain on full dose aspirin and Plavix for the next 3 months and subsequently Plavix thereafter.  He is also to remain on his simvastatin as prescribed.  He was noted to have a questionable thrombus seen on carotid duplex to the right side for which further ultrasound of his right upper extremity was pursued with no noted DVT.  He is otherwise in stable condition for discharge today with no acute events throughout the course of this admission.  His LDL was 89 and his hemoglobin A1c was 6.8%.  He has had repeat 2D echocardiogram with no findings of thrombus and LVEF 40-45% with global hypokinesis noted.  He was noted to be cocaine positive during this admission and he has been counseled on cessation.  Discharge Diagnoses:  Active Problems:   CVA (cerebral vascular accident) [facility])  Principal discharge diagnosis: Acute dysarthria secondary to acute infarct involving the left corpus callosum with noted multivessel intracranial occlusive  disease.  Discharge Instructions  Discharge Instructions    Diet - low sodium heart healthy   Complete by: As directed    Increase activity slowly   Complete by: As directed      Allergies as of 10/09/2020      Reactions   Poison Ivy Extract  [poison Ivy Extract] Itching, Rash      Medication List    STOP taking these medications   HYDROcodone-acetaminophen 5-325 MG tablet Commonly known as: NORCO/VICODIN   oxyCODONE-acetaminophen 10-325 MG tablet Commonly known as: PERCOCET   oxyCODONE-acetaminophen 5-325 MG tablet Commonly known as: Percocet     TAKE these medications   acetaminophen 500 MG tablet Commonly known as: TYLENOL Take 1,000 mg by mouth every 4 (four) hours as needed for mild pain.   amLODipine 10 MG tablet Commonly known as: NORVASC Take 10 mg by mouth daily.   aspirin EC 325 MG tablet Take 1 tablet (325 mg total) by mouth daily. What changed:   medication strength  how much to take   cholecalciferol 25 MCG (1000 UNIT) tablet Commonly known as: VITAMIN D3 Take 1,000 Units by mouth daily.   clopidogrel 75 MG tablet Commonly known as: PLAVIX Take 1 tablet (75 mg total) by mouth daily with breakfast. Start taking on: October 10, 2020   gabapentin 300 MG capsule Commonly known as: NEURONTIN Take 1 capsule (300 mg total) by mouth 3 (three) times daily. What changed:   when to take this  reasons to take this  Another medication with the same name was removed. Continue taking this medication, and follow the directions you see here.   glucose 4 GM chewable tablet Chew 1 tablet by mouth as needed for low blood sugar.   labetalol 300 MG tablet Commonly known as: NORMODYNE Take 150 mg by mouth 2 (two) times daily.   lidocaine 5 % Commonly known as: Lidoderm Place 1 patch onto the skin daily. Remove & Discard patch within 12 hours or as directed by MD What changed:   when to take this  reasons to take this   metFORMIN 500 MG tablet Commonly known as: GLUCOPHAGE Take 500 mg by mouth 2 (two) times daily with a meal.   ondansetron 4 MG tablet Commonly known as: Zofran Take 1 tablet (4 mg total) by mouth every 8 (eight) hours as needed for nausea or vomiting.   simvastatin 40 MG  tablet Commonly known as: ZOCOR Take 40 mg by mouth every evening.   tiZANidine 4 MG tablet Commonly known as: ZANAFLEX Take 0.5-1 tablets (2-4 mg total) by mouth 2 (two) times daily as needed for muscle spasms.       Follow-up Information    Lucia Gaskins, MD Follow up in 1 week(s).   Specialty: Internal Medicine Contact information: [place] [address] [phone]        Phillips Odor, MD Follow up in 6 week(s).   Specialty: Neurology Contact information: [address] [place] [address] [phone]              Allergies  Allergen Reactions  . Poison Ivy Extract [Poison Ivy Extract] Itching and Rash    Consultations:  Neurology   Procedures/Studies: CT Head Wo Contrast  Result Date: 10/07/2020 CLINICAL DATA:  Neuro deficit, acute, stroke suspected. Additional provided: Patient reports slurred speech, trouble forming words, abnormal gait and left-sided weakness. Symptoms since October 31st. EXAM: CT HEAD WITHOUT CONTRAST TECHNIQUE: Contiguous axial images were obtained from the base  of the skull through the vertex without intravenous contrast. COMPARISON:  Brain MRI 04/19/2019. FINDINGS: Brain: Mild generalized cerebral atrophy. Redemonstrated chronic lacunar infarcts within the bilateral basal ganglia. Background moderate ill-defined hypoattenuation within the cerebral white matter is nonspecific, but compatible with chronic small vessel ischemic disease. There is no acute intracranial hemorrhage. No demarcated cortical infarct. No extra-axial fluid collection. No evidence of intracranial mass. No midline shift. Vascular: No hyperdense vessel.  Atherosclerotic calcifications. Skull: Normal. Negative for fracture or focal lesion. Sinuses/Orbits: Visualized orbits show no acute finding. No significant paranasal sinus disease at the imaged levels. IMPRESSION: No evidence of acute intracranial abnormality. Redemonstrated chronic  bilateral basal ganglia lacunar infarcts. Background mild cerebral atrophy and moderate chronic small vessel ischemic disease. Electronically Signed   By: Kellie Simmering DO   On: 10/07/2020 09:53   MR ANGIO HEAD WO CONTRAST  Result Date: 10/08/2020 CLINICAL DATA:  Neuro deficit, acute stroke suspected. Weakness and slurred speech. Slurred speech began 7 days prior to presentation. EXAM: MRI HEAD WITHOUT CONTRAST MRA HEAD WITHOUT CONTRAST TECHNIQUE: Multiplanar, multiecho pulse sequences of the brain and surrounding structures were obtained without intravenous contrast. Angiographic images of the head were obtained using MRA technique without contrast. COMPARISON:  MR head without contrast 04/19/2019 FINDINGS: MRI HEAD FINDINGS Brain: The diffusion-weighted images demonstrate acute/subacute infarct involving the left side of the body of the corpus callosum. There is some involvement of the adjacent cingulate gyrus. Infarct extends through the body to the splenium of the corpus callosum on the left without crossing midline. Cortical lesions are present. Remote lacunar infarcts are present in the basal ganglia, left greater than right. T2 signal changes are present throughout the area acute/subacute infarction. Confluent periventricular and subcortical T2 changes have progressed since the prior exam. Wallerian degeneration is noted in the left cerebral peduncle and pons. Brainstem and cerebellum are otherwise normal. Vascular: Flow is present in the major intracranial arteries. Skull and upper cervical spine: The craniocervical junction is normal. Degenerative changes are present cervical spine, most notably at C3-4. Marrow signal is normal. Sinuses/Orbits: The paranasal sinuses and mastoid air cells are clear. The globes and orbits are within normal limits. MRA HEAD FINDINGS Atherosclerotic changes are present. Pre cavernous right ICA demonstrates 50% stenosis. Less than 50% supra ophthalmic narrowing is present on  the right. Approximately 50% stenosis present at the anterior genu on the left. ICA termini are normal. The A1 and M1 segments are normal. The anterior communicating artery is patent. Mild narrowing is present in the dominant posterior left M2 segment. Moderate segmental stenoses are present in the more posterior M3 division the same branches. Moderate M3 stenosis is present on the right. Tandem stenoses are present in the left ACA with focal occlusion at the level of the corpus callosum consistent with the area of infarct. The left vertebral artery is dominant. High-grade stenosis is present in the distal right vertebral artery just below the vertebrobasilar junction. PICA origins are not included on the study. Basilar artery is normal. Both posterior cerebral arteries originate from the basilar tip. Moderate stenosis is present at the junction of the left P1 and P2 segment. There is significant attenuation PCA branch vessels bilaterally. IMPRESSION: 1. Acute/subacute infarct involving the left side of the body of the corpus callosum extending through the splenium of the corpus callosum, left greater than right. 2. Left ACA occlusion consistent with the area of acute/subacute infarction. 3. Moderate stenosis of the dominant posterior left M2 segment. 4. Moderate segmental stenoses in  the more posterior left M3 division. 5. 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 6. 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 7. 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 These results will be called to the ordering clinician or representative by the Radiologist Assistant, and communication documented in the PACS or [employer]. Electronically Signed   By: San Morelle M.D.   On: 10/08/2020 09:26   MR BRAIN WO CONTRAST  Result Date: 10/08/2020 CLINICAL DATA:  Neuro deficit, acute stroke suspected. Weakness and  slurred speech. Slurred speech began 7 days prior to presentation. EXAM: MRI HEAD WITHOUT CONTRAST MRA HEAD WITHOUT CONTRAST TECHNIQUE: Multiplanar, multiecho pulse sequences of the brain and surrounding structures were obtained without intravenous contrast. Angiographic images of the head were obtained using MRA technique without contrast. COMPARISON:  MR head without contrast 04/19/2019 FINDINGS: MRI HEAD FINDINGS Brain: The diffusion-weighted images demonstrate acute/subacute infarct involving the left side of the body of the corpus callosum. There is some involvement of the adjacent cingulate gyrus. Infarct extends through the body to the splenium of the corpus callosum on the left without crossing midline. Cortical lesions are present. Remote lacunar infarcts are present in the basal ganglia, left greater than right. T2 signal changes are present throughout the area acute/subacute infarction. Confluent periventricular and subcortical T2 changes have progressed since the prior exam. Wallerian degeneration is noted in the left cerebral peduncle and pons. Brainstem and cerebellum are otherwise normal. Vascular: Flow is present in the major intracranial arteries. Skull and upper cervical spine: The craniocervical junction is normal. Degenerative changes are present cervical spine, most notably at C3-4. Marrow signal is normal. Sinuses/Orbits: The paranasal sinuses and mastoid air cells are clear. The globes and orbits are within normal limits. MRA HEAD FINDINGS Atherosclerotic changes are present. Pre cavernous right ICA demonstrates 50% stenosis. Less than 50% supra ophthalmic narrowing is present on the right. Approximately 50% stenosis present at the anterior genu on the left. ICA termini are normal. The A1 and M1 segments are normal. The anterior communicating artery is patent. Mild narrowing is present in the dominant posterior left M2 segment. Moderate segmental stenoses are present in the more posterior M3  division the same branches. Moderate M3 stenosis is present on the right. Tandem stenoses are present in the left ACA with focal occlusion at the level of the corpus callosum consistent with the area of infarct. The left vertebral artery is dominant. High-grade stenosis is present in the distal right vertebral artery just below the vertebrobasilar junction. PICA origins are not included on the study. Basilar artery is normal. Both posterior cerebral arteries originate from the basilar tip. Moderate stenosis is present at the junction of the left P1 and P2 segment. There is significant attenuation PCA branch vessels bilaterally. IMPRESSION: 1. Acute/subacute infarct involving the left side of the body of the corpus callosum extending through the splenium of the corpus callosum, left greater than right. 2. Left ACA occlusion consistent with the area of acute/subacute infarction. 3. Moderate stenosis of the dominant posterior left M2 segment. 4. Moderate segmental stenoses in the more posterior left M3 division. 5. 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 6. 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 7. 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 These results will be called to the ordering clinician or representative by the Radiologist Assistant, and communication documented in the PACS or [place]  [employer]. Electronically Signed   By: San Morelle M.D.   On: 10/08/2020 09:26   US Carotid Bilateral (at [facility] and [facility] only)  Result Date: 10/08/2020 CLINICAL DATA:  Stroke. Syncopal episode. History of hypertension, hyperlipidemia, diabetes and smoking. EXAM: BILATERAL CAROTID DUPLEX ULTRASOUND TECHNIQUE: Pearline Cables scale imaging, color Doppler and duplex ultrasound were performed of bilateral carotid and vertebral arteries in the neck. COMPARISON:  Carotid Doppler ultrasound-02/02/2017 FINDINGS: Criteria:  Quantification of carotid stenosis is based on velocity parameters that correlate the residual internal carotid diameter with NASCET-based stenosis levels, using the diameter of the distal internal carotid lumen as the denominator for stenosis measurement. The following velocity measurements were obtained: RIGHT ICA: 56/24 cm/sec CCA: 48/5 cm/sec SYSTOLIC ICA/CCA RATIO:  1.8 ECA: 58 cm/sec LEFT ICA: 35/19 cm/sec CCA: 46/27 cm/sec SYSTOLIC ICA/CCA RATIO:  0.7 ECA: 69 cm/sec RIGHT CAROTID ARTERY: There is a minimal amount of intimal thickening/hypoechoic plaque within the right carotid bulb (image 16), unchanged to slightly progressed compared to the 2018 examination, though not resulting in elevated peak systolic velocities within the interrogated course of the right internal carotid artery to suggest a hemodynamically significant stenosis. RIGHT VERTEBRAL ARTERY:  Antegrade flow LEFT CAROTID ARTERY: There is a very minimal amount of eccentric echogenic plaque involving the proximal aspect of the left internal carotid artery (image 64), slightly progressed compared to the 2018 examination, though not resulting in elevated peak systolic velocities within the interrogated course of the left internal carotid artery to suggest a hemodynamically significant stenosis. LEFT VERTEBRAL ARTERY:  Antegrade flow Incidental note made of age-indeterminate apparent hypoechoic occlusive thrombus within the incidentally imaged right internal jugular vein (images 20 through 26) IMPRESSION: 1. Minimal amount of bilateral atherosclerotic plaque, unchanged to slightly progressed compared to the 2018 examination, though not resulting in a hemodynamically significant stenosis within either internal carotid artery. 2. Age-indeterminate apparent thrombus within the right internal jugular vein. Clinical correlation, including for previous central line placement, is advised. Further evaluation with dedicated right upper extremity venous  ultrasound could be performed as indicated. Electronically Signed   By: Sandi Mariscal M.D.   On: 10/08/2020 09:47   US Venous Img Upper Uni Right(DVT)  Result Date: 10/09/2020 CLINICAL DATA:  Concern for potential DVT involving the right internal jugular vein. Evaluate for acute or chronic DVT involving the right upper extremity. EXAM: RIGHT UPPER EXTREMITY VENOUS DOPPLER ULTRASOUND TECHNIQUE: Gray-scale sonography with graded compression, as well as color Doppler and duplex ultrasound were performed to evaluate the upper extremity deep venous system from the level of the subclavian vein and including the jugular, axillary, basilic, radial, ulnar and upper cephalic vein. Spectral Doppler was utilized to evaluate flow at rest and with distal augmentation maneuvers. COMPARISON:  None. FINDINGS: Contralateral Subclavian Vein: Respiratory phasicity is normal and symmetric with the symptomatic side. No evidence of thrombus. Normal compressibility. Internal Jugular Vein: No evidence of acute or chronic thrombus. Normal compressibility, respiratory phasicity and response to augmentation. Subclavian Vein: No evidence of thrombus. Normal compressibility, respiratory phasicity and response to augmentation. Axillary Vein: No evidence of thrombus. Normal compressibility, respiratory phasicity and response to augmentation. Cephalic Vein: No evidence of thrombus. Normal compressibility, respiratory phasicity and response to augmentation. Basilic Vein: No evidence of thrombus. Normal compressibility, respiratory phasicity and response to augmentation. Brachial Veins: No evidence of thrombus. Normal compressibility, respiratory phasicity and response to augmentation. Radial Veins: No evidence of thrombus. Normal compressibility, respiratory phasicity and response to augmentation. Ulnar Veins: No evidence of thrombus. Normal compressibility, respiratory  phasicity and response to augmentation. Venous Reflux:  None visualized. Other  Findings:  None visualized. IMPRESSION: No evidence of acute or chronic DVT involving the right upper extremity with special attention paid to the right internal jugular vein. Note, previously questioned thrombus within the right internal jugular vein may have been artifactual secondary to rouleaux/slow flow. Electronically Signed   By: Sandi Mariscal M.D.   On: 10/09/2020 11:13   ECHOCARDIOGRAM COMPLETE  Result Date: 10/08/2020    ECHOCARDIOGRAM REPORT   Patient Name:   Raymond Barrera. Date of Exam: 10/08/2020 Medical Rec #:  [id number]          Height:       78.0 in Accession #:    [id number]         Weight:       265.4 lb Date of Birth:  [birth year]          BSA:          2.546 m Patient Age:    59 years           BP:           152/99 mmHg Patient Gender: M                  HR:           73 bpm. Exam Location:  [facility] Procedure: 2D Echo Indications:    Stroke 434.91 / I163.9  History:        Patient has prior history of Echocardiogram examinations, most                 recent 04/19/2019. TIA and Stroke, Signs/Symptoms:Syncope; Risk                 Factors:Dyslipidemia, Hypertension, Current Smoker and Diabetes.                 Cocaine Abuse.  Sonographer:    Leavy Cella RDCS (AE) Referring Phys: [phone] Royanne Foots [place]  1. Left ventricular ejection fraction, by estimation, is 40 to 45%. The left ventricle has mildly decreased function. The left ventricle demonstrates global hypokinesis. There is moderate left ventricular hypertrophy. Left ventricular diastolic parameters are indeterminate.  2. Right ventricular systolic function is normal. The right ventricular size is normal. Tricuspid regurgitation signal is inadequate for assessing PA pressure.  3. There is a trivial pericardial effusion posterior to the left ventricle.  4. The mitral valve is grossly normal. Trivial mitral valve regurgitation.  5. The aortic valve is tricuspid. There is moderate calcification of the aortic valve. Aortic  valve regurgitation is not visualized.  6. The inferior vena cava is normal in size with greater than 50% respiratory variability, suggesting right atrial pressure of 3 mmHg. FINDINGS  Left Ventricle: Left ventricular ejection fraction, by estimation, is 40 to 45%. The left ventricle has mildly decreased function. The left ventricle demonstrates global hypokinesis. The left ventricular internal cavity size was normal in size. There is  moderate left ventricular hypertrophy. Left ventricular diastolic parameters are indeterminate. Right Ventricle: The right ventricular size is normal. No increase in right ventricular wall thickness. Right ventricular systolic function is normal. Tricuspid regurgitation signal is inadequate for assessing PA pressure. Left Atrium: Left atrial size was normal in size. Right Atrium: Right atrial size was normal in size. Pericardium: Trivial pericardial effusion is present. The pericardial effusion is posterior to the left ventricle. Mitral Valve: The mitral valve is grossly normal. There is mild calcification  of the mitral valve leaflet(s). Mild mitral annular calcification. Trivial mitral valve regurgitation. Tricuspid Valve: The tricuspid valve is grossly normal. Tricuspid valve regurgitation is trivial. Aortic Valve: The aortic valve is tricuspid. There is moderate calcification of the aortic valve. There is mild aortic valve annular calcification. Aortic valve regurgitation is not visualized. Pulmonic Valve: The pulmonic valve was grossly normal. Pulmonic valve regurgitation is trivial. Aorta: The aortic root is normal in size and structure. Venous: The inferior vena cava is normal in size with greater than 50% respiratory variability, suggesting right atrial pressure of 3 mmHg. IAS/Shunts: No atrial level shunt detected by color flow Doppler.  LEFT VENTRICLE PLAX 2D LVIDd:         4.47 cm  Diastology LVIDs:         3.54 cm  LV e' medial:    5.66 cm/s LV PW:         2.11 cm  LV E/e'  medial:  6.7 LV IVS:        1.67 cm  LV e' lateral:   4.46 cm/s LVOT diam:     2.10 cm  LV E/e' lateral: 8.5 LVOT Area:     3.46 cm  RIGHT VENTRICLE RV S prime:     13.10 cm/s TAPSE (M-mode): 3.2 cm LEFT ATRIUM             Index       RIGHT ATRIUM           Index LA diam:        4.60 cm 1.81 cm/m  RA Area:     18.20 cm LA Vol (A2C):   80.7 ml 31.70 ml/m RA Volume:   49.10 ml  19.28 ml/m LA Vol (A4C):   65.6 ml 25.76 ml/m LA Biplane Vol: 74.5 ml 29.26 ml/m   AORTA Ao Root diam: 3.50 cm MITRAL VALVE MV Area (PHT): 2.66 cm    SHUNTS MV Decel Time: 285 msec    Systemic Diam: 2.10 cm MV E velocity: 37.70 cm/s MV A velocity: 65.10 cm/s MV E/A ratio:  0.58 Rozann Lesches MD Electronically signed by Rozann Lesches MD Signature Date/Time: 10/08/2020/11:58:20 AM    Final      Discharge Exam: Vitals:   10/08/20 2200 10/09/20 0512  BP: (!) 157/101 (!) 138/97  Pulse: 77 70  Resp: 20 20  Temp: 98 F (36.7 C) 98.6 F (37 C)  SpO2: 98% 100%   Vitals:   10/08/20 0535 10/08/20 1704 10/08/20 2200 10/09/20 0512  BP: (!) 152/99 (!) 157/100 (!) 157/101 (!) 138/97  Pulse: 73 72 77 70  Resp: 20 18 20 20   Temp: 98.1 F (36.7 C) 98.2 F (36.8 C) 98 F (36.7 C) 98.6 F (37 C)  TempSrc:   Oral Oral  SpO2: 99% 100% 98% 100%    General: Pt is alert, awake, not in acute distress Cardiovascular: RRR, S1/S2 +, no rubs, no gallops Respiratory: CTA bilaterally, no wheezing, no rhonchi Abdominal: Soft, NT, ND, bowel sounds + Extremities: no edema, no cyanosis    The results of significant diagnostics from this hospitalization (including imaging, microbiology, ancillary and laboratory) are listed below for reference.     Microbiology: Recent Results (from the past 240 hour(s))  Respiratory Panel by RT PCR (Flu A&B, Covid) - Nasopharyngeal Swab     Status: None   Collection Time: 10/07/20  9:22 AM   Specimen: Nasopharyngeal Swab  Result Value Ref Range Status   SARS Coronavirus 2 by RT PCR  NEGATIVE  NEGATIVE Final    Comment: (NOTE) SARS-CoV-2 target nucleic acids are NOT DETECTED.  The SARS-CoV-2 RNA is generally detectable in upper respiratoy specimens during the acute phase of infection. The lowest concentration of SARS-CoV-2 viral copies this assay can detect is 131 copies/mL. A negative result does not preclude SARS-Cov-2 infection and should not be used as the sole basis for treatment or other patient management decisions. A negative result may occur with  improper specimen collection/handling, submission of specimen other than nasopharyngeal swab, presence of viral mutation(s) within the areas targeted by this assay, and inadequate number of viral copies (<131 copies/mL). A negative result must be combined with clinical observations, patient history, and epidemiological information. The expected result is Negative.  Fact Sheet for Patients:  PinkCheek.be  Fact Sheet for Healthcare Providers:  GravelBags.it  This test is no t yet approved or cleared by the [place] FDA and  has been authorized for detection and/or diagnosis of SARS-CoV-2 by FDA under an Emergency Use Authorization (EUA). This EUA will remain  in effect (meaning this test can be used) for the duration of the COVID-19 declaration under Section 564(b)(1) of the Act, 21 U.S.C. section 360bbb-3(b)(1), unless the authorization is terminated or revoked sooner.     Influenza A by PCR NEGATIVE NEGATIVE Final   Influenza B by PCR NEGATIVE NEGATIVE Final    Comment: (NOTE) The Xpert Xpress SARS-CoV-2/FLU/RSV assay is intended as an aid in  the diagnosis of influenza from Nasopharyngeal swab specimens and  should not be used as a sole basis for treatment. Nasal washings and  aspirates are unacceptable for Xpert Xpress SARS-CoV-2/FLU/RSV  testing.  Fact Sheet for Patients: PinkCheek.be  Fact Sheet for Healthcare  Providers: GravelBags.it  This test is not yet approved or cleared by the [place] FDA and  has been authorized for detection and/or diagnosis of SARS-CoV-2 by  FDA under an Emergency Use Authorization (EUA). This EUA will remain  in effect (meaning this test can be used) for the duration of the  Covid-19 declaration under Section 564(b)(1) of the Act, 21  U.S.C. section 360bbb-3(b)(1), unless the authorization is  terminated or revoked. Performed at [facility], [address], [place], [place] [address]      Labs: BNP (last 3 results) No results for input(s): BNP in the last 8760 hours. Basic Metabolic Panel: Recent Labs  Lab 10/07/20 0906 10/07/20 1008  NA 136 141  K 4.2 4.3  CL 102 104  CO2 25  --   GLUCOSE 169* 160*  BUN 25* 26*  CREATININE 2.06* 2.20*  CALCIUM 10.1  --    Liver Function Tests: Recent Labs  Lab 10/07/20 0906  AST 20  ALT 25  ALKPHOS 79  BILITOT 0.6  PROT 7.9  ALBUMIN 4.2   No results for input(s): LIPASE, AMYLASE in the last 168 hours. No results for input(s): AMMONIA in the last 168 hours. CBC: Recent Labs  Lab 10/07/20 0906 10/07/20 1008  WBC 6.1  --   NEUTROABS 4.0  --   HGB 14.0 13.3  HCT 40.6 39.0  MCV 89.8  --   PLT 256  --    Cardiac Enzymes: No results for input(s): CKTOTAL, CKMB, CKMBINDEX, TROPONINI in the last 168 hours. BNP: Invalid input(s): POCBNP CBG: Recent Labs  Lab 10/08/20 1659 10/08/20 1721 10/08/20 2058 10/09/20 0737 10/09/20 1128  GLUCAP 96 95 188* 112* 82   D-Dimer No results for input(s): DDIMER in the last 72 hours. Hgb A1c  Recent Labs    10/08/20 0627  HGBA1C 6.8*   Lipid Profile Recent Labs    10/08/20 0628  CHOL 164  HDL 32*  LDLCALC 89  TRIG 213*  CHOLHDL 5.1   Thyroid function studies No results for input(s): TSH, T4TOTAL, T3FREE, THYROIDAB in the last 72 hours.  Invalid input(s): FREET3 Anemia work up No results for input(s): VITAMINB12,  FOLATE, FERRITIN, TIBC, IRON, RETICCTPCT in the last 72 hours. Urinalysis    Component Value Date/Time   COLORURINE YELLOW 10/07/2020 1103   APPEARANCEUR CLEAR 10/07/2020 1103   LABSPEC 1.019 10/07/2020 1103   PHURINE 5.0 10/07/2020 1103   GLUCOSEU NEGATIVE 10/07/2020 1103   HGBUR NEGATIVE 10/07/2020 1103   BILIRUBINUR NEGATIVE 10/07/2020 1103   KETONESUR NEGATIVE 10/07/2020 1103   PROTEINUR 100 (A) 10/07/2020 1103   UROBILINOGEN 1.0 03/08/2015 1011   NITRITE NEGATIVE 10/07/2020 1103   LEUKOCYTESUR NEGATIVE 10/07/2020 1103   Sepsis Labs Invalid input(s): PROCALCITONIN,  WBC,  LACTICIDVEN Microbiology Recent Results (from the past 240 hour(s))  Respiratory Panel by RT PCR (Flu A&B, Covid) - Nasopharyngeal Swab     Status: None   Collection Time: 10/07/20  9:22 AM   Specimen: Nasopharyngeal Swab  Result Value Ref Range Status   SARS Coronavirus 2 by RT PCR NEGATIVE NEGATIVE Final    Comment: (NOTE) SARS-CoV-2 target nucleic acids are NOT DETECTED.  The SARS-CoV-2 RNA is generally detectable in upper respiratoy specimens during the acute phase of infection. The lowest concentration of SARS-CoV-2 viral copies this assay can detect is 131 copies/mL. A negative result does not preclude SARS-Cov-2 infection and should not be used as the sole basis for treatment or other patient management decisions. A negative result may occur with  improper specimen collection/handling, submission of specimen other than nasopharyngeal swab, presence of viral mutation(s) within the areas targeted by this assay, and inadequate number of viral copies (<131 copies/mL). A negative result must be combined with clinical observations, patient history, and epidemiological information. The expected result is Negative.  Fact Sheet for Patients:  PinkCheek.be  Fact Sheet for Healthcare Providers:  GravelBags.it  This test is no t yet approved or  cleared by the [place] FDA and  has been authorized for detection and/or diagnosis of SARS-CoV-2 by FDA under an Emergency Use Authorization (EUA). This EUA will remain  in effect (meaning this test can be used) for the duration of the COVID-19 declaration under Section 564(b)(1) of the Act, 21 U.S.C. section 360bbb-3(b)(1), unless the authorization is terminated or revoked sooner.     Influenza A by PCR NEGATIVE NEGATIVE Final   Influenza B by PCR NEGATIVE NEGATIVE Final    Comment: (NOTE) The Xpert Xpress SARS-CoV-2/FLU/RSV assay is intended as an aid in  the diagnosis of influenza from Nasopharyngeal swab specimens and  should not be used as a sole basis for treatment. Nasal washings and  aspirates are unacceptable for Xpert Xpress SARS-CoV-2/FLU/RSV  testing.  Fact Sheet for Patients: PinkCheek.be  Fact Sheet for Healthcare Providers: GravelBags.it  This test is not yet approved or cleared by the [place] FDA and  has been authorized for detection and/or diagnosis of SARS-CoV-2 by  FDA under an Emergency Use Authorization (EUA). This EUA will remain  in effect (meaning this test can be used) for the duration of the  Covid-19 declaration under Section 564(b)(1) of the Act, 21  U.S.C. section 360bbb-3(b)(1), unless the authorization is  terminated or revoked. Performed at [facility], [address], [place],  [place] [address]      Time coordinating discharge: 35 minutes  SIGNED:   Rodena Goldmann, DO [facility] 10/09/2020, 12:18 PM  If 7PM-7AM, please contact night-coverage [email or web address]

## 2020-10-16 ENCOUNTER — Ambulatory Visit (HOSPITAL_COMMUNITY): Payer: Medicaid Other | Admitting: Speech Pathology

## 2020-10-16 ENCOUNTER — Encounter (HOSPITAL_COMMUNITY): Payer: Self-pay

## 2020-10-18 ENCOUNTER — Ambulatory Visit (HOSPITAL_COMMUNITY): Payer: Medicaid Other | Attending: Orthopedic Surgery | Admitting: Speech Pathology

## 2020-10-18 ENCOUNTER — Other Ambulatory Visit: Payer: Self-pay

## 2020-10-18 ENCOUNTER — Encounter (HOSPITAL_COMMUNITY): Payer: Self-pay | Admitting: Speech Pathology

## 2020-10-18 DIAGNOSIS — R471 Dysarthria and anarthria: Secondary | ICD-10-CM | POA: Diagnosis not present

## 2020-10-18 DIAGNOSIS — R41841 Cognitive communication deficit: Secondary | ICD-10-CM | POA: Diagnosis present

## 2020-10-18 NOTE — Therapy (Signed)
[place], [place], [address] Phone: [phone]   Fax:  [phone]  Speech Language Pathology Evaluation  Patient Details  Name: Raymond Barrera. MRN: [id number] Date of Birth: July 10, 1961 Referring Provider (SLP): Dickey Gave, MD   Encounter Date: 10/18/2020   End of Session - 10/18/20 1545    Visit Number 1    Number of Visits 9    Date for SLP Re-Evaluation 11/29/20    Authorization Type Medicaid    SLP Start Time 1034    SLP Stop Time  1120    SLP Time Calculation (min) 46 min    Activity Tolerance Patient tolerated treatment well           Past Medical History:  Diagnosis Date  . Ambulates with cane    straight - uses occasionally  . Anxiety    due to the stroke  . Arthritis   . Back pain    hx of buldging disc  . Complication of anesthesia    took a while for him to wake up after previous anesthesia  . Diabetes mellitus without complication ([place])   . Family history of adverse reaction to anesthesia    "sometimes mom has a hard time waking up"  . High cholesterol    takes Zocor daily  . Hypertension    takes Benazepril and HCTZ  daily  . Joint pain   . Joint swelling   . Memory impairment    occassional - from stroke  . Myocardial infarction ([place]) 1987  . Pneumonia    hx of-80's  . Shortness of breath dyspnea    do to pain  . Sleep apnea    never had a sleep study,but states Dr. Cindie Laroche says he has it  . Slurred speech   . Stroke ([place]) 08/2013   7 mini-strokes, last stroke 2017  . TIA (transient ischemic attack) 2014   x 7   . Urinary frequency     Past Surgical History:  Procedure Laterality Date  . ABDOMINAL EXPOSURE N/A 06/16/2018   Procedure: ABDOMINAL EXPOSURE;  Surgeon: Rosetta Posner, MD;  Location: [facility];  Service: Vascular;  Laterality: N/A;  . ANKLE SURGERY  2008   left ankle-otif-[facility]  . ANTERIOR LUMBAR FUSION Bilateral 06/16/2018   Procedure: LUMBAR 4-5 LUMBAR  5-SACRUM 1 ANTERIOR LUMBAR INTERBODY FUSION WITH INSTRUMENTATION AND ALLOGRAFT;  Surgeon: Phylliss Bob, MD;  Location: [facility];  Service: Orthopedics;  Laterality: Bilateral;  . BACK SURGERY    . HEMATOMA EVACUATION Left 08/13/2018   Procedure: EVACUATION HEMATOMA LEFT ABDOMINAL WALL;  Surgeon: Rosetta Posner, MD;  [facility];  Service: Vascular;  Laterality: Left;  . IR RADIOLOGIST EVAL & MGMT  07/27/2018  . IR US GUIDE BX ASP/DRAIN  07/14/2018  . JOINT REPLACEMENT     both hips replaced   . LUMBAR LAMINECTOMY/DECOMPRESSION MICRODISCECTOMY Right 10/17/2014   Procedure: LUMBAR LAMINECTOMY/DECOMPRESSION MICRODISCECTOMY 2 LEVELS;  Surgeon: [name], MD;  Location: [facility];  Service: Neurosurgery;  Laterality: Right;  Right L45 L5S1 laminectomy and foraminotomy  . MASS EXCISION  09/13/2012   Procedure: EXCISION MASS;  Surgeon: Jamesetta So, MD;  Location: [facility];  Service: General;  Laterality: N/A;  . ROBOTIC ASSITED PARTIAL NEPHRECTOMY Left 03/30/2019   Procedure: XI ROBOTIC ASSITED PARTIAL NEPHRECTOMY POSSIBLE RADICAL NEPHRECTOMY;  Surgeon: Ceasar Mons, MD;  Location: [facility];  Service: Urology;  Laterality: Left;  . SHOULDER ARTHROSCOPY WITH ROTATOR  CUFF REPAIR Right 04/12/2020   Procedure: right shoulder arthroscopy, debridement, mini open rotator cuff tear repair;  Surgeon: Meredith Pel, MD;  Location: [facility];  Service: Orthopedics;  Laterality: Right;  . TONSILLECTOMY    . TOTAL HIP ARTHROPLASTY Right 03/20/2015  . TOTAL HIP ARTHROPLASTY Right 03/20/2015   Procedure: TOTAL HIP ARTHROPLASTY ANTERIOR APPROACH;  Surgeon: Renette Butters, MD;  Location: [facility];  Service: Orthopedics;  Laterality: Right;  . TOTAL HIP ARTHROPLASTY Left 01/08/2016   Procedure: TOTAL HIP ARTHROPLASTY ANTERIOR APPROACH;  Surgeon: Renette Butters, MD;  Location: [facility];  Service: Orthopedics;  Laterality: Left;    There were no vitals filed for this visit.   Subjective Assessment -  10/18/20 1531    Subjective "I need to get my voice back, no one can understand me."    Special Tests SLUMS    Currently in Pain? No/denies              SLP Evaluation [facility] - 10/18/20 1531      SLP Visit Information   SLP Received On 10/18/20    Referring Provider (SLP) Dickey Gave, MD    Onset Date 09/28/2020    Medical Diagnosis infarct left corpus callosum      Subjective   Subjective "I need to get my voice back."    Patient/Family Stated Goal improve speech      General Information   HPI Raymond Barrera. is a 59 y.o. male with medical history significant for TIAs with prior CVA, type 2 diabetes, CKD 3B, dyslipidemia, hypertension, prior MI, sleep apnea, and renal cell carcinoma status post prior nephrectomy who presented to the ED at the urging of his wife for slurred speech that began approximately 7 days prior. He seems to think that his symptoms began approximately around 10/29 and he has been stubborn about presenting to the ED for further evaluation despite knowing that these could be stroke like symptoms. He has also had some difficulty walking with his cane at baseline and he has had some weakness in both of his hands prohibiting some fine motor function. He does not have any new symptoms over the last 24 h, but his symptoms have persisted over the last several days. His speech is normally clear otherwise. He denies any headaches, but does have some intermittent blurry vision. He denies any nausea or vomiting, chest pain, shortness of breath, fevers, or chills. He is noted to have some residual weakness greater on the right side from a CVA on 01/2019. He is also noted to have blood sugar and blood pressure elevations at home, all despite taking his usual home medications. 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 He was discharged  home on 10/09/20. He was referred for SLE by Dr. Merlene Laughter with a diagnosis of dysarthria.     Behavioral/Cognition alert and cooperative    Mobility Status ambulatory with cane      Balance Screen   Has the patient fallen in the past 6 months No    Has the patient had a decrease in activity level because of a fear of falling?  No    Is the patient reluctant to leave their home because of a fear of falling?  No      Prior Functional Status   Cognitive/Linguistic Baseline Baseline deficits    Baseline deficit details memory  deficits from previous strokes    Type of Home Mobile home     Lives With Spouse    Available Support Family    Education 12th grade    Vocation On disability      Pain Assessment   Pain Assessment No/denies pain      Cognition   Overall Cognitive Status History of cognitive impairments - at baseline      Auditory Comprehension   Overall Auditory Comprehension Appears within functional limits for tasks assessed    Yes/No Questions Within Functional Limits    Commands Within Functional Limits    Conversation Simple    Interfering Components Working memory    Soil scientist Within Function Limits      Reading Comprehension   Reading Status Not tested      Expression   Primary Mode of Expression Verbal      Verbal Expression   Overall Verbal Expression Impaired    Initiation No impairment    Automatic Speech Name;Social Response;Day of week    Level of Generative/Spontaneous Verbalization Sentence    Repetition Impaired    Level of Impairment Phrase level    Naming Impairment    Responsive 76-100% accurate    Confrontation 50-74% accurate    Convergent 75-100% accurate    Divergent 75-100% accurate    Pragmatics No impairment    Interfering Components Speech intelligibility    Effective Techniques Phonemic cues    Non-Verbal Means of Communication Not applicable      Written  Expression   Dominant Hand Right    Written Expression Not tested      Oral Motor/Sensory Function   Overall Oral Motor/Sensory Function Impaired    Labial ROM Reduced right    Labial Symmetry Abnormal symmetry right    Labial Strength Within Functional Limits    Labial Sensation Within Functional Limits    Lingual Symmetry Within Functional Limits    Lingual Strength Within Functional Limits    Lingual Coordination Reduced    Facial ROM Within Functional Limits    Velum Within Functional Limits    Mandible Within Functional Limits      Motor Speech   Overall Motor Speech Impaired    Respiration Impaired    Level of Impairment Phrase    Phonation --   glottal fry   Resonance Within functional limits    Articulation Impaired    Level of Impairment Phrase    Intelligibility Intelligibility reduced    Word 75-100% accurate    Phrase 50-74% accurate    Sentence 50-74% accurate    Conversation 50-74% accurate    Motor Planning --   to be further assessed   Motor Speech Errors Aware;Groping for words;Inconsistent    Interfering Components Inadequate dentition   breath support difficulties from previous stroke   Effective Techniques Slow rate;Increased vocal intensity;Over-articulate;Pause    Phonation Impaired    Vocal Abuses --   glottal fry   Volume Appropriate    Pitch Appropriate      Standardized Assessments   Standardized Assessments  --   SLUMS 19/30             SLP Education - 10/18/20 1543    Education Details Provided plan for treatment for expressive deficits; handouts on dysarthria and word finding strategies    Person(s) Educated Patient    Methods Explanation;Handout    Comprehension Verbalized understanding;Need further instruction  SLP Short Term Goals - 10/18/20 1548      SLP SHORT TERM GOAL #1   Title Pt will increase conversation level speech intelligibility to 80% by utilizing appropriate strategies and listener needing repetition  no more than 3x in a given conversation.    Baseline 75% intelligibilt    Time 6    Period Weeks    Status New    Target Date 11/29/20      SLP SHORT TERM GOAL #2   Title Pt will implement word-finding strategies with 90% accuracy when unable to verbalize desired word in conversation/functional tasks with mi/mod assist.    Baseline 75%    Time 6    Period Weeks    Status New    Target Date 11/29/20      SLP SHORT TERM GOAL #3   Title Pt will complete moderate level verbal expression tasks (object description, state function, provide short summary, etc) using short sentences with 90% acc and min assist.    Baseline 75%    Time 6    Period Weeks    Status New    Target Date 11/29/20      SLP SHORT TERM GOAL #4   Title Pt will utilize speech intelligibility strategies (over articulation, reduced speaking rate, and vocal intensity) at the word/sentence/phrase level with 80% acc and min cues.    Baseline 75% intelligible given sentences to repeat    Time 6    Period Weeks    Status New    Target Date 11/29/20            SLP Long Term Goals - 10/18/20 1554      SLP LONG TERM GOAL #1   Title same as short term goals            Plan - 10/18/20 1546    Clinical Impression Statement Pt presents with mild/moderate dysarthria negatively impacting speech intelligibility at the phrase level. Pt with known previous cognitive linguistic deficits from previous strokes. He also presents with word finding deficits which impact his ability communicate moderately complex thoughts. Pt is motivated to improve his speech and wants his family to be able to understand him better. Pt will benefit from skilled SLP in order to address the above impairments, maximize independence, and decrease burden of care.      Speech Therapy Frequency 2x / week    Duration 4 weeks    Treatment/Interventions Compensatory strategies;Patient/family education;Cueing hierarchy;Multimodal communcation  approach;Compensatory techniques;SLP instruction and feedback;Internal/external aids    Potential to Achieve Goals Fair    Potential Considerations Ability to learn/carryover information    SLP Home Exercise Plan Pt will complete HEP as assigned to facilitate carryover of treatment strategies and techniques in the home environment    Consulted and Agree with Plan of Care Patient           Patient will benefit from skilled therapeutic intervention in order to improve the following deficits and impairments:   Dysarthria and anarthria  Cognitive communication deficit    Problem List Patient Active Problem List   Diagnosis Date Noted  . CVA (cerebral vascular accident) ([facility]) 10/07/2020  . Syncope 04/18/2019  . Chronic systolic heart failure ([place])   . Pain of both hip joints   . History of renal cell carcinoma   . Renal mass 03/30/2019  . Abdominal hematoma 08/13/2018  . Radiculopathy 06/16/2018  . Acute CVA (cerebrovascular accident) ([facility]) 02/01/2017  . Diabetes mellitus ([facility]) 02/01/2017  . HTN (hypertension)  02/01/2017  . Primary osteoarthritis of left hip 01/08/2016  . DJD (degenerative joint disease) 03/20/2015  . Primary osteoarthritis of right hip 02/26/2015  . Lumbar spondylosis 10/17/2014  . Lumbago 07/25/2014  . Abnormality of gait 07/25/2014  . Difficulty in walking(719.7) 07/25/2014  . TIA (transient ischemic attack) 03/09/2013  . Cocaine abuse ([place]) 03/09/2013  . Accelerated hypertension 03/09/2013  . Hyperlipidemia 03/09/2013  . Current smoker 03/09/2013   Thank you,  Genene Churn, [place]  [facility] 10/18/2020, 3:55 PM  [facility] [address] [place], [place], [address] Phone: [phone]   Fax:  [phone]  Name: Raymond Barrera. MRN: [id number] Date of Birth: 03/29/1961

## 2020-11-08 ENCOUNTER — Ambulatory Visit (HOSPITAL_COMMUNITY): Payer: Medicaid Other | Attending: Orthopedic Surgery | Admitting: Speech Pathology

## 2020-11-08 ENCOUNTER — Other Ambulatory Visit: Payer: Self-pay

## 2020-11-08 ENCOUNTER — Encounter (HOSPITAL_COMMUNITY): Payer: Self-pay | Admitting: Speech Pathology

## 2020-11-08 DIAGNOSIS — R471 Dysarthria and anarthria: Secondary | ICD-10-CM | POA: Diagnosis not present

## 2020-11-08 DIAGNOSIS — R41841 Cognitive communication deficit: Secondary | ICD-10-CM | POA: Diagnosis present

## 2020-11-08 IMAGING — NM NUCLEAR MEDICINE THREE PHASE BONE SCAN
10 series · 20 of 20 positions shown · non-contrast
Comparison: Hip radiographs 01/27/2019. Three-phase bone scan
10/12/2015.

CLINICAL DATA: Chronic left hip pain since total hip arthroplasty
Evaluate prosthetic loosening.

EXAM:
NUCLEAR MEDICINE 3-PHASE BONE SCAN
TECHNIQUE: Radionuclide angiographic images, immediate static blood pool
images, and 3-hour delayed static images were obtained of the pelvis
and hips after intravenous injection of radiopharmaceutical.
RADIOPHARMACEUTICALS:  21.7 mCi Oc-MMm MDP IV

[Series 1: flow · 2.07mm/px · 6 of 48 frames shown (1 of 2)]
[frame 5/48]
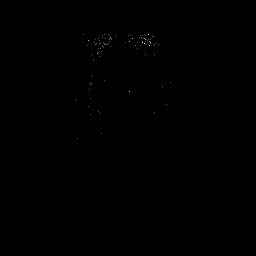
[frame 13/48  full-range]
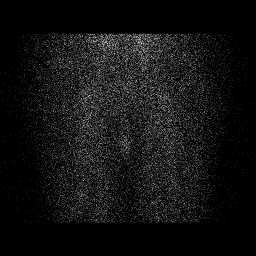
[frame 21/48  full-range]
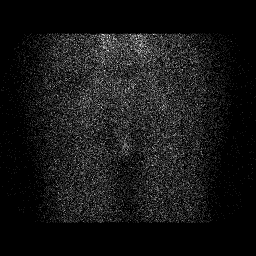
[frame 29/48  full-range]
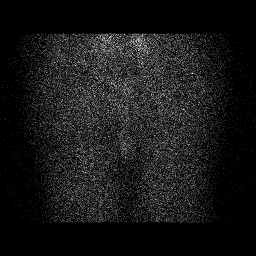
[frame 37/48  full-range]
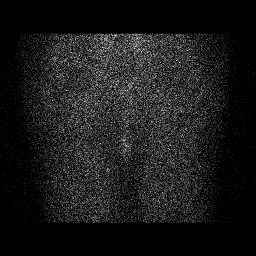
[frame 45/48  full-range]
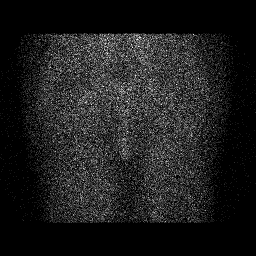

[Series 1: flow · 2.07mm/px · 6 of 48 frames shown (2 of 2)]
[frame 5/48]
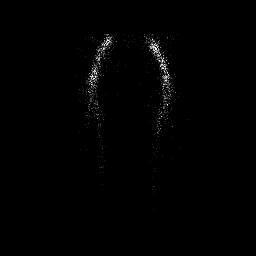
[frame 13/48  full-range]
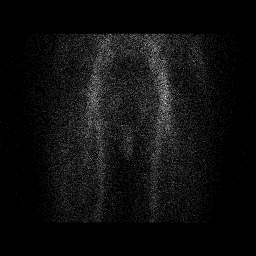
[frame 21/48  full-range]
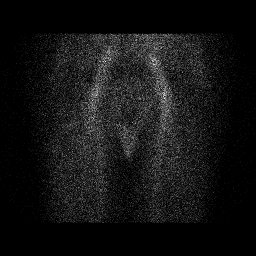
[frame 29/48  full-range]
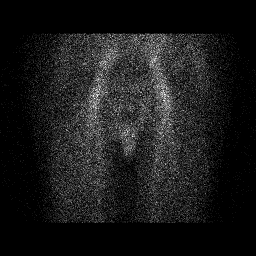
[frame 37/48  full-range]
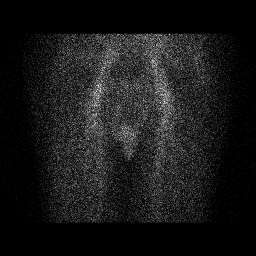
[frame 45/48  full-range]
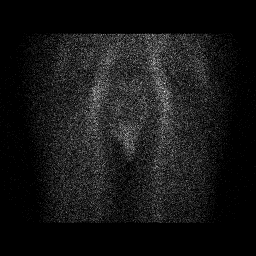

[Series 2: blood pool · 2.07mm/px · 1 of 1 slices shown (1 of 2)]
[im 1/1]
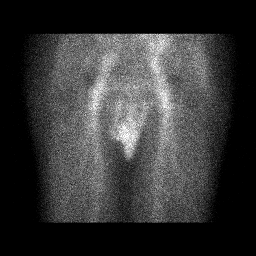

[Series 2: blood pool · 2.07mm/px · 1 of 1 slices shown (2 of 2)]
[im 1/1  full-range]
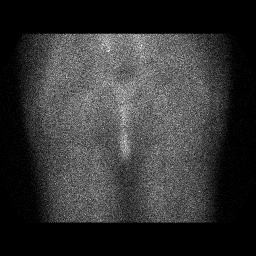

[Series 3: lat bp · 2.07mm/px · 1 of 1 slices shown (1 of 2)]
[im 1/1]
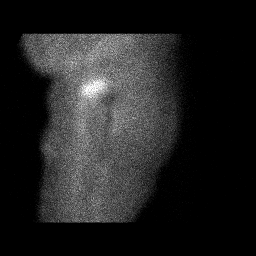

[Series 3: lat bp · 2.07mm/px · 1 of 1 slices shown (2 of 2)]
[im 1/1]
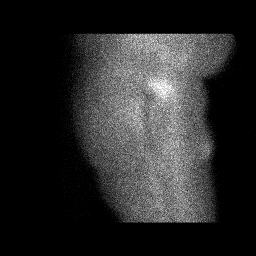

[Series 4: delay · delayed · 2.07mm/px · 1 of 1 slices shown (1 of 4)]
[im 1/1]
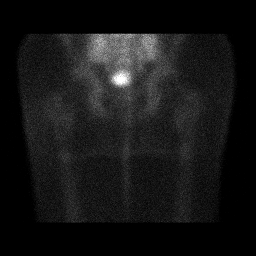

[Series 4: delay · delayed · 2.07mm/px · 1 of 1 slices shown (2 of 4)]
[im 1/1]
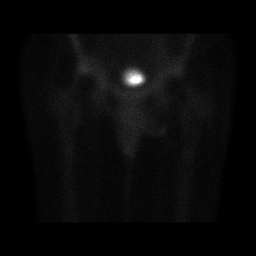

[Series 5: delay · delayed · 2.07mm/px · 1 of 1 slices shown (3 of 4)]
[im 1/1]
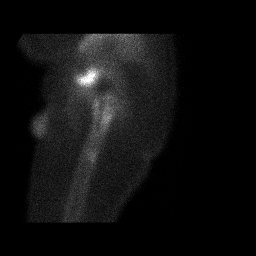

[Series 5: delay · delayed · 2.07mm/px · 1 of 1 slices shown (4 of 4)]
[im 1/1]
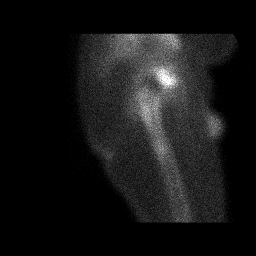

[20 of 20 positions shown; findings below may reference images not displayed]

FINDINGS: Vascular phase: Normal symmetric perfusion bilaterally.

Blood pool phase: There is normal blood pool activity in the pelvis
and proximal thighs.

Delayed phase: There is no suspicious activity surrounding either
total hip arthroplasty. There is low-level activity along the tip of
the femoral prosthesis bilaterally which appears fairly symmetric.
This is typically stress mediated, and there is no evidence of
prosthetic loosening.
IMPRESSION: 1. No acute findings or evidence of prosthetic loosening. The
dynamic and blood pool images are normal.
2. Minimal, nearly symmetric activity along distal ends of the
femoral prostheses bilaterally, likely stress mediated.

## 2020-11-08 NOTE — Therapy (Signed)
[facility] [place], [place], [address] Phone: [phone]   Fax:  [phone]  Speech Language Pathology Treatment  Patient Details  Name: Raymond Barrera. MRN: [id number] Date of Birth: 05-14-1961 Referring Provider (SLP): Dickey Gave, MD   Encounter Date: 11/08/2020   End of Session - 11/08/20 1044    Visit Number 2    Number of Visits 9    Date for SLP Re-Evaluation 11/29/20    Authorization Type Medicaid    SLP Start Time 1024    SLP Stop Time  1110    SLP Time Calculation (min) 46 min    Activity Tolerance Patient tolerated treatment well           Past Medical History:  Diagnosis Date  . Ambulates with cane    straight - uses occasionally  . Anxiety    due to the stroke  . Arthritis   . Back pain    hx of buldging disc  . Complication of anesthesia    took a while for him to wake up after previous anesthesia  . Diabetes mellitus without complication ([place])   . Family history of adverse reaction to anesthesia    "sometimes mom has a hard time waking up"  . High cholesterol    takes Zocor daily  . Hypertension    takes Benazepril and HCTZ  daily  . Joint pain   . Joint swelling   . Memory impairment    occassional - from stroke  . Myocardial infarction ([place]) 1987  . Pneumonia    hx of-80's  . Shortness of breath dyspnea    do to pain  . Sleep apnea    never had a sleep study,but states Dr. Cindie Laroche says he has it  . Slurred speech   . Stroke ([place]) 08/2013   7 mini-strokes, last stroke 2017  . TIA (transient ischemic attack) 2014   x 7   . Urinary frequency     Past Surgical History:  Procedure Laterality Date  . ABDOMINAL EXPOSURE N/A 06/16/2018   Procedure: ABDOMINAL EXPOSURE;  Surgeon: Rosetta Posner, MD;  Location: [facility];  Service: Vascular;  Laterality: N/A;  . ANKLE SURGERY  2008   left ankle-otif-[facility]  . ANTERIOR LUMBAR FUSION Bilateral 06/16/2018   Procedure: LUMBAR 4-5 LUMBAR  5-SACRUM 1 ANTERIOR LUMBAR INTERBODY FUSION WITH INSTRUMENTATION AND ALLOGRAFT;  Surgeon: Phylliss Bob, MD;  Location: [facility];  Service: Orthopedics;  Laterality: Bilateral;  . BACK SURGERY    . HEMATOMA EVACUATION Left 08/13/2018   Procedure: EVACUATION HEMATOMA LEFT ABDOMINAL WALL;  Surgeon: Rosetta Posner, MD;  [facility];  Service: Vascular;  Laterality: Left;  . IR RADIOLOGIST EVAL & MGMT  07/27/2018  . IR US GUIDE BX ASP/DRAIN  07/14/2018  . JOINT REPLACEMENT     both hips replaced   . LUMBAR LAMINECTOMY/DECOMPRESSION MICRODISCECTOMY Right 10/17/2014   Procedure: LUMBAR LAMINECTOMY/DECOMPRESSION MICRODISCECTOMY 2 LEVELS;  Surgeon: [name], MD;  Location: [facility];  Service: Neurosurgery;  Laterality: Right;  Right L45 L5S1 laminectomy and foraminotomy  . MASS EXCISION  09/13/2012   Procedure: EXCISION MASS;  Surgeon: Jamesetta So, MD;  [facility];  Service: General;  Laterality: N/A;  . ROBOTIC ASSITED PARTIAL NEPHRECTOMY Left 03/30/2019   Procedure: XI ROBOTIC ASSITED PARTIAL NEPHRECTOMY POSSIBLE RADICAL NEPHRECTOMY;  Surgeon: Ceasar Mons, MD;  [facility];  Service: Urology;  Laterality: Left;  . SHOULDER ARTHROSCOPY WITH ROTATOR  CUFF REPAIR Right 04/12/2020   Procedure: right shoulder arthroscopy, debridement, mini open rotator cuff tear repair;  Surgeon: Meredith Pel, MD;  Location: [facility];  Service: Orthopedics;  Laterality: Right;  . TONSILLECTOMY    . TOTAL HIP ARTHROPLASTY Right 03/20/2015  . TOTAL HIP ARTHROPLASTY Right 03/20/2015   Procedure: TOTAL HIP ARTHROPLASTY ANTERIOR APPROACH;  Surgeon: Renette Butters, MD;  Location: [facility];  Service: Orthopedics;  Laterality: Right;  . TOTAL HIP ARTHROPLASTY Left 01/08/2016   Procedure: TOTAL HIP ARTHROPLASTY ANTERIOR APPROACH;  Surgeon: Renette Butters, MD;  Location: [facility];  Service: Orthopedics;  Laterality: Left;    There were no vitals filed for this visit.   Subjective Assessment -  11/08/20 1031    Subjective "It is not any better."    Currently in Pain? Yes    Pain Score 7     Pain Location Shoulder    Pain Orientation Right              ADULT SLP TREATMENT - 11/08/20 1032      General Information   Behavior/Cognition Cooperative;Alert;Pleasant mood    Patient Positioning Upright in chair    Oral care provided N/A    HPI Raymond Barrera. is a 59 y.o. male with medical history significant for TIAs with prior CVA, type 2 diabetes, CKD 3B, dyslipidemia, hypertension, prior MI, sleep apnea, and renal cell carcinoma status post prior nephrectomy who presented to the ED at the urging of his wife for slurred speech that began approximately 7 days prior. He seems to think that his symptoms began approximately around 10/29 and he has been stubborn about presenting to the ED for further evaluation despite knowing that these could be stroke like symptoms. He has also had some difficulty walking with his cane at baseline and he has had some weakness in both of his hands prohibiting some fine motor function. He does not have any new symptoms over the last 24 h, but his symptoms have persisted over the last several days. His speech is normally clear otherwise. He denies any headaches, but does have some intermittent blurry vision. He denies any nausea or vomiting, chest pain, shortness of breath, fevers, or chills. He is noted to have some residual weakness greater on the right side from a CVA on 01/2019. He is also noted to have blood sugar and blood pressure elevations at home, all despite taking his usual home medications. 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 He was discharged home on 10/09/20. He was referred for SLE by Dr. Merlene Laughter with a diagnosis of dysarthria.      Treatment Provided   Treatment provided Cognitive-Linquistic       Cognitive-Linquistic Treatment   Treatment focused on Aphasia;Dysarthria;Patient/family/caregiver education    Skilled Treatment SLP provided cues for breath support to reduce glottal fry, sustained phonation, glides, vocal function, speech intelligibility (pauses, over articulation, pacing), and word finding strategies.      Assessment / Recommendations / Plan   Plan Continue with current plan of care      Progression Toward Goals   Progression toward goals Progressing toward goals              SLP Short Term Goals - 11/08/20 1047      SLP SHORT TERM GOAL #1   Title Pt will increase conversation level speech  intelligibility to 80% by utilizing appropriate strategies and listener needing repetition no more than 3x in a given conversation.    Baseline 75% intelligibilt    Time 6    Period Weeks    Status On-going    Target Date 11/29/20      SLP SHORT TERM GOAL #2   Title Pt will implement word-finding strategies with 90% accuracy when unable to verbalize desired word in conversation/functional tasks with mi/mod assist.    Baseline 75%    Time 6    Period Weeks    Status On-going    Target Date 11/29/20      SLP SHORT TERM GOAL #3   Title Pt will complete moderate level verbal expression tasks (object description, state function, provide short summary, etc) using short sentences with 90% acc and min assist.    Baseline 75%    Time 6    Period Weeks    Status On-going    Target Date 11/29/20      SLP SHORT TERM GOAL #4   Title Pt will utilize speech intelligibility strategies (over articulation, reduced speaking rate, and vocal intensity) at the word/sentence/phrase level with 80% acc and min cues.    Baseline 75% intelligible given sentences to repeat    Time 6    Period Weeks    Status On-going    Target Date 11/29/20            SLP Long Term Goals - 11/08/20 1049      SLP LONG TERM GOAL #1   Title same as short term goals            Plan -  11/08/20 1046    Clinical Impression Statement Pt presents with mild/moderate dysarthria negatively impacting speech intelligibility at the phrase level. Pt with known previous cognitive linguistic deficits from previous strokes. He also presents with word finding deficits which impact his ability communicate moderately complex thoughts. Pt is motivated to improve his speech and wants his family to be able to understand him better. Pt will benefit from skilled SLP in order to address the above impairments, maximize independence, and decrease burden of care.    Speech Therapy Frequency 2x / week    Duration 4 weeks    Treatment/Interventions Compensatory strategies;Patient/family education;Cueing hierarchy;Multimodal communcation approach;Compensatory techniques;SLP instruction and feedback;Internal/external aids    Potential to Achieve Goals Fair    Potential Considerations Ability to learn/carryover information    SLP Home Exercise Plan Pt will complete HEP as assigned to facilitate carryover of treatment strategies and techniques in the home environment    Consulted and Agree with Plan of Care Patient           Patient will benefit from skilled therapeutic intervention in order to improve the following deficits and impairments:   Dysarthria and anarthria  Cognitive communication deficit    Problem List Patient Active Problem List   Diagnosis Date Noted  . CVA (cerebral vascular accident) ([place]) 10/07/2020  . Syncope 04/18/2019  . Chronic systolic heart failure ([place])   . Pain of both hip joints   . History of renal cell carcinoma   . Renal mass 03/30/2019  . Abdominal hematoma 08/13/2018  . Radiculopathy 06/16/2018  . Acute CVA (cerebrovascular accident) ([place]) 02/01/2017  . Diabetes mellitus ([place]) 02/01/2017  . HTN (hypertension) 02/01/2017  . Primary osteoarthritis of left hip 01/08/2016  . DJD (degenerative joint disease) 03/20/2015  . Primary osteoarthritis of right hip  02/26/2015  . Lumbar spondylosis  10/17/2014  . Lumbago 07/25/2014  . Abnormality of gait 07/25/2014  . Difficulty in walking(719.7) 07/25/2014  . TIA (transient ischemic attack) 03/09/2013  . Cocaine abuse ([place]) 03/09/2013  . Accelerated hypertension 03/09/2013  . Hyperlipidemia 03/09/2013  . Current smoker 03/09/2013   Thank you,  Genene Churn, CCC-SLP [phone]  [facility] 11/08/2020, 4:19 PM  [facility] [address] [place], [place], [address] Phone: [phone]   Fax:  [phone]   Name: Domenic Schoenberger. MRN: [id number] Date of Birth: [birth year]

## 2020-11-12 ENCOUNTER — Ambulatory Visit (HOSPITAL_COMMUNITY): Payer: Medicaid Other | Admitting: Speech Pathology

## 2020-11-14 ENCOUNTER — Ambulatory Visit (HOSPITAL_COMMUNITY): Payer: Medicaid Other | Admitting: Speech Pathology

## 2020-11-19 ENCOUNTER — Ambulatory Visit (HOSPITAL_COMMUNITY): Payer: Medicaid Other | Admitting: Speech Pathology

## 2020-11-19 ENCOUNTER — Other Ambulatory Visit: Payer: Self-pay

## 2020-11-19 ENCOUNTER — Encounter (HOSPITAL_COMMUNITY): Payer: Self-pay | Admitting: Speech Pathology

## 2020-11-19 DIAGNOSIS — R471 Dysarthria and anarthria: Secondary | ICD-10-CM

## 2020-11-19 DIAGNOSIS — R41841 Cognitive communication deficit: Secondary | ICD-10-CM

## 2020-11-19 NOTE — Therapy (Addendum)
[facility] [place], [place], [address] Phone: [phone]   Fax:  [phone]  Speech Language Pathology Treatment  Patient Details  Name: Raymond Barrera. MRN: [id number] Date of Birth: December 08, 1960 Referring Provider (SLP): Dickey Gave, MD   Encounter Date: 11/19/2020   End of Session - 11/19/20 0913    Visit Number 3    Number of Visits 9    Date for SLP Re-Evaluation 11/29/20    Authorization Type Medicaid    Authorization Time Period 10/31/2020-11/27/2020    Authorization - Visit Number 2    Authorization - Number of Visits 8    SLP Start Time 9150    SLP Stop Time  0940    SLP Time Calculation (min) 45 min    Activity Tolerance Patient tolerated treatment well           Past Medical History:  Diagnosis Date  . Ambulates with cane    straight - uses occasionally  . Anxiety    due to the stroke  . Arthritis   . Back pain    hx of buldging disc  . Complication of anesthesia    took a while for him to wake up after previous anesthesia  . Diabetes mellitus without complication ([place])   . Family history of adverse reaction to anesthesia    "sometimes mom has a hard time waking up"  . High cholesterol    takes Zocor daily  . Hypertension    takes Benazepril and HCTZ  daily  . Joint pain   . Joint swelling   . Memory impairment    occassional - from stroke  . Myocardial infarction ([place]) 1987  . Pneumonia    hx of-80's  . Shortness of breath dyspnea    do to pain  . Sleep apnea    never had a sleep study,but states Dr. Cindie Laroche says he has it  . Slurred speech   . Stroke ([place]) 08/2013   7 mini-strokes, last stroke 2017  . TIA (transient ischemic attack) 2014   x 7   . Urinary frequency     Past Surgical History:  Procedure Laterality Date  . ABDOMINAL EXPOSURE N/A 06/16/2018   Procedure: ABDOMINAL EXPOSURE;  Surgeon: Rosetta Posner, MD;  Location: [facility];  Service: Vascular;  Laterality: N/A;   . ANKLE SURGERY  2008   left ankle-otif-[facility]  . ANTERIOR LUMBAR FUSION Bilateral 06/16/2018   Procedure: LUMBAR 4-5 LUMBAR 5-SACRUM 1 ANTERIOR LUMBAR INTERBODY FUSION WITH INSTRUMENTATION AND ALLOGRAFT;  Surgeon: Phylliss Bob, MD;  Location: [facility];  Service: Orthopedics;  Laterality: Bilateral;  . BACK SURGERY    . HEMATOMA EVACUATION Left 08/13/2018   Procedure: EVACUATION HEMATOMA LEFT ABDOMINAL WALL;  Surgeon: Rosetta Posner, MD;  [facility];  Service: Vascular;  Laterality: Left;  . IR RADIOLOGIST EVAL & MGMT  07/27/2018  . IR US GUIDE BX ASP/DRAIN  07/14/2018  . JOINT REPLACEMENT     both hips replaced   . LUMBAR LAMINECTOMY/DECOMPRESSION MICRODISCECTOMY Right 10/17/2014   Procedure: LUMBAR LAMINECTOMY/DECOMPRESSION MICRODISCECTOMY 2 LEVELS;  Surgeon: [name], MD;  Location: [facility];  Service: Neurosurgery;  Laterality: Right;  Right L45 L5S1 laminectomy and foraminotomy  . MASS EXCISION  09/13/2012   Procedure: EXCISION MASS;  Surgeon: Jamesetta So, MD;  [facility];  Service: General;  Laterality: N/A;  . ROBOTIC ASSITED PARTIAL NEPHRECTOMY Left 03/30/2019   Procedure: XI ROBOTIC ASSITED PARTIAL NEPHRECTOMY POSSIBLE  RADICAL NEPHRECTOMY;  Surgeon: Rene Paci, MD;  [facility];  Service: Urology;  Laterality: Left;  . SHOULDER ARTHROSCOPY WITH ROTATOR CUFF REPAIR Right 04/12/2020   Procedure: right shoulder arthroscopy, debridement, mini open rotator cuff tear repair;  Surgeon: Cammy Copa, MD;  Location: [facility];  Service: Orthopedics;  Laterality: Right;  . TONSILLECTOMY    . TOTAL HIP ARTHROPLASTY Right 03/20/2015  . TOTAL HIP ARTHROPLASTY Right 03/20/2015   Procedure: TOTAL HIP ARTHROPLASTY ANTERIOR APPROACH;  Surgeon: Sheral Apley, MD;  [facility];  Service: Orthopedics;  Laterality: Right;  . TOTAL HIP ARTHROPLASTY Left 01/08/2016   Procedure: TOTAL HIP ARTHROPLASTY ANTERIOR APPROACH;  Surgeon: Sheral Apley, MD;  [facility];  Service: Orthopedics;  Laterality: Left;    There were no vitals filed for this visit.   Subjective Assessment - 11/19/20 0905    Subjective "I've been studying, but not sure it is helping."    Currently in Pain? No/denies                 ADULT SLP TREATMENT - 11/19/20 0905      General Information   Behavior/Cognition Cooperative;Alert;Pleasant mood    Patient Positioning Upright in chair    Oral care provided N/A    HPI Raymond Barrera. is a 59 y.o. male with medical history significant for TIAs with prior CVA, type 2 diabetes, CKD 3B, dyslipidemia, hypertension, prior MI, sleep apnea, and renal cell carcinoma status post prior nephrectomy who presented to the ED at the urging of his wife for slurred speech that began approximately 7 days prior. He seems to think that his symptoms began approximately around 10/29 and he has been stubborn about presenting to the ED for further evaluation despite knowing that these could be stroke like symptoms. He has also had some difficulty walking with his cane at baseline and he has had some weakness in both of his hands prohibiting some fine motor function. He does not have any new symptoms over the last 24 h, but his symptoms have persisted over the last several days. His speech is normally clear otherwise. He denies any headaches, but does have some intermittent blurry vision. He denies any nausea or vomiting, chest pain, shortness of breath, fevers, or chills. He is noted to have some residual weakness greater on the right side from a CVA on 01/2019. He is also noted to have blood sugar and blood pressure elevations at home, all despite taking his usual home medications. 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 He was discharged home on 10/09/20. He was referred for SLE by Dr. Gerilyn Pilgrim with a  diagnosis of dysarthria.      Treatment Provided   Treatment provided Cognitive-Linquistic      Pain Assessment   Pain Assessment No/denies pain      Cognitive-Linquistic Treatment   Treatment focused on Aphasia;Dysarthria;Patient/family/caregiver education    Skilled Treatment SLP provided cues for breath support to reduce glottal fry, sustained phonation, glides, vocal function, speech intelligibility (pauses, over articulation, pacing), and word finding strategies.      Assessment / Recommendations / Plan   Plan Continue with current plan of care      Progression Toward Goals   Progression toward goals Progressing toward goals            SLP Education -  11/19/20 0910    Education Details continue practice with sentence repetition and cognitive tasks    Person(s) Educated Patient    Methods Explanation;Handout    Comprehension Verbalized understanding            SLP Short Term Goals - 11/19/20 0944      SLP SHORT TERM GOAL #1   Title Pt will increase conversation level speech intelligibility to 80% by utilizing appropriate strategies and listener needing repetition no more than 3x in a given conversation.    Baseline 75% intelligibilt    Time 6    Period Weeks    Status On-going    Target Date 11/29/20      SLP SHORT TERM GOAL #2   Title Pt will implement word-finding strategies with 90% accuracy when unable to verbalize desired word in conversation/functional tasks with mi/mod assist.    Baseline 75%    Time 6    Period Weeks    Status On-going    Target Date 11/29/20      SLP SHORT TERM GOAL #3   Title Pt will complete moderate level verbal expression tasks (object description, state function, provide short summary, etc) using short sentences with 90% acc and min assist.    Baseline 75%    Time 6    Period Weeks    Status On-going    Target Date 11/29/20      SLP SHORT TERM GOAL #4   Title Pt will utilize speech intelligibility strategies (over  articulation, reduced speaking rate, and vocal intensity) at the word/sentence/phrase level with 80% acc and min cues.    Baseline 75% intelligible given sentences to repeat    Time 6    Period Weeks    Status On-going    Target Date 11/29/20            SLP Long Term Goals - 11/19/20 0945      SLP LONG TERM GOAL #1   Title same as short term goals            Plan - 11/19/20 0940    Clinical Impression Statement Mild/moderate dysarthria persists in addition to expressive language deficits which negatively impact expressive communication. He required mod/max cues for breath support and vocal function exercises today, but demonstrated improved his ability to sustain /a/ for up to 10 seconds. He described objects with min assist and created sentences with a given word with mi/mod cues. Pt with limited literacy skills so does well with picture tasks and routines.  Pt to practice with HEP and continue plan of care focusing on dysarthria and word finding treatment strategies. Pt only able to attend 2/8 requested treatment sessions due to difficulty with Pt transportation. Will request additional visits through Medicaid.   Speech Therapy Frequency 2x / week    Duration 4 weeks    Treatment/Interventions Compensatory strategies;Patient/family education;Cueing hierarchy;Multimodal communcation approach;Compensatory techniques;SLP instruction and feedback;Internal/external aids    Potential to Achieve Goals Fair    Potential Considerations Ability to learn/carryover information    SLP Home Exercise Plan Pt will complete HEP as assigned to facilitate carryover of treatment strategies and techniques in the home environment    Consulted and Agree with Plan of Care Patient           Patient will benefit from skilled therapeutic intervention in order to improve the following deficits and impairments:   Dysarthria and anarthria  Cognitive communication deficit    Problem List Patient Active  Problem List  Diagnosis Date Noted  . CVA (cerebral vascular accident) ([place]) 10/07/2020  . Syncope 04/18/2019  . Chronic systolic heart failure ([place])   . Pain of both hip joints   . History of renal cell carcinoma   . Renal mass 03/30/2019  . Abdominal hematoma 08/13/2018  . Radiculopathy 06/16/2018  . Acute CVA (cerebrovascular accident) ([place]) 02/01/2017  . Diabetes mellitus ([place]) 02/01/2017  . HTN (hypertension) 02/01/2017  . Primary osteoarthritis of left hip 01/08/2016  . DJD (degenerative joint disease) 03/20/2015  . Primary osteoarthritis of right hip 02/26/2015  . Lumbar spondylosis 10/17/2014  . Lumbago 07/25/2014  . Abnormality of gait 07/25/2014  . Difficulty in walking(719.7) 07/25/2014  . TIA (transient ischemic attack) 03/09/2013  . Cocaine abuse ([place]) 03/09/2013  . Accelerated hypertension 03/09/2013  . Hyperlipidemia 03/09/2013  . Current smoker 03/09/2013    Addendum: SPEECH THERAPY DISCHARGE SUMMARY  Visits from Start of Care: 3  Current functional level related to goals / functional outcomes: Partially met goals, Pt failed to return to clinic since 11/19/20   Remaining deficits: As above   Education / Equipment: HEP provided  Plan: Patient agrees to discharge.  Patient goals were not met. Patient is being discharged due to not returning since the last visit.  ?????     If Pt would like to return to clinic, he will need a new order due to cancellations and no shows from 11/19/20-01/16/21. SLP left a voice mail for [name] spouse.  Thank you,  Genene Churn, [place]  [facility] 11/19/2020, 9:45 AM  [facility] [address] [place], [place], [address] Phone: [phone]   Fax:  [phone]   Name: Mehki Klumpp. MRN: [id number] Date of Birth: Feb 09, 1961

## 2020-11-26 ENCOUNTER — Ambulatory Visit (HOSPITAL_COMMUNITY): Payer: Medicaid Other | Admitting: Speech Pathology

## 2020-12-03 ENCOUNTER — Ambulatory Visit (HOSPITAL_COMMUNITY): Payer: Medicaid Other | Admitting: Physical Therapy

## 2020-12-03 ENCOUNTER — Ambulatory Visit (HOSPITAL_COMMUNITY): Payer: Medicaid Other | Admitting: Speech Pathology

## 2020-12-05 ENCOUNTER — Ambulatory Visit (HOSPITAL_COMMUNITY): Payer: Medicaid Other | Attending: Orthopedic Surgery | Admitting: Speech Pathology

## 2020-12-10 ENCOUNTER — Telehealth (HOSPITAL_COMMUNITY): Payer: Self-pay | Admitting: Speech Pathology

## 2020-12-10 ENCOUNTER — Ambulatory Visit (HOSPITAL_COMMUNITY): Payer: Medicaid Other | Admitting: Speech Pathology

## 2020-12-10 NOTE — Telephone Encounter (Signed)
pt's wife called to cx today's appt due to her husband is not feeling well

## 2020-12-13 ENCOUNTER — Ambulatory Visit (HOSPITAL_COMMUNITY): Payer: Medicaid Other | Admitting: Physical Therapy

## 2020-12-13 ENCOUNTER — Ambulatory Visit: Payer: Medicaid Other | Admitting: Cardiovascular Disease

## 2021-01-07 ENCOUNTER — Ambulatory Visit (HOSPITAL_COMMUNITY): Payer: Medicaid Other | Admitting: Speech Pathology

## 2021-01-07 ENCOUNTER — Ambulatory Visit (HOSPITAL_COMMUNITY): Payer: Medicaid Other

## 2021-01-09 ENCOUNTER — Ambulatory Visit (HOSPITAL_COMMUNITY): Payer: Medicaid Other | Admitting: Speech Pathology

## 2021-01-09 ENCOUNTER — Encounter (HOSPITAL_COMMUNITY): Payer: Medicaid Other | Admitting: Physical Therapy

## 2021-01-14 ENCOUNTER — Ambulatory Visit (HOSPITAL_COMMUNITY): Payer: Medicaid Other | Admitting: Speech Pathology

## 2021-01-14 ENCOUNTER — Encounter (HOSPITAL_COMMUNITY): Payer: Medicaid Other

## 2021-01-16 ENCOUNTER — Ambulatory Visit (HOSPITAL_COMMUNITY): Payer: Medicaid Other | Admitting: Speech Pathology

## 2021-01-16 ENCOUNTER — Ambulatory Visit (HOSPITAL_COMMUNITY): Payer: Medicaid Other | Attending: Orthopedic Surgery

## 2021-01-16 ENCOUNTER — Telehealth (HOSPITAL_COMMUNITY): Payer: Self-pay | Admitting: Speech Pathology

## 2021-01-16 NOTE — Telephone Encounter (Signed)
[name] for Raymond Barrera to cx all future appointments b/c of No Shows> and that patient will need new referral to return or a new evaluation.

## 2021-01-16 NOTE — Telephone Encounter (Signed)
Telephone Call:  Pt failed to show for his 9:00 appointment. SLP left a voice mail for spouse stating that if he would like to return for SLP visits, he will need a new order from MD as Pt has not returned to clinic since 11/19/2020. See addendum for 11/19/20 note for d/c.  Thank you,  Genene Churn, CCC-SLP [phone]

## 2021-01-18 ENCOUNTER — Other Ambulatory Visit: Payer: Self-pay | Admitting: Orthopedic Surgery

## 2021-01-18 NOTE — Telephone Encounter (Signed)
Please advise 

## 2021-01-21 ENCOUNTER — Encounter: Payer: Medicaid Other | Admitting: Physician Assistant

## 2021-01-21 ENCOUNTER — Encounter (HOSPITAL_COMMUNITY): Payer: Medicaid Other | Admitting: Physical Therapy

## 2021-01-21 NOTE — Progress Notes (Signed)
This encounter was created in error - please disregard.

## 2021-01-22 ENCOUNTER — Ambulatory Visit (HOSPITAL_COMMUNITY): Payer: Medicaid Other | Admitting: Speech Pathology

## 2021-01-24 ENCOUNTER — Ambulatory Visit (HOSPITAL_COMMUNITY): Payer: Medicaid Other | Admitting: Speech Pathology

## 2021-01-28 ENCOUNTER — Encounter (HOSPITAL_COMMUNITY): Payer: Medicaid Other

## 2021-01-28 ENCOUNTER — Ambulatory Visit (HOSPITAL_COMMUNITY): Payer: Medicaid Other | Admitting: Speech Pathology

## 2021-01-30 ENCOUNTER — Ambulatory Visit (HOSPITAL_COMMUNITY): Payer: Medicaid Other | Admitting: Physical Therapy

## 2021-01-30 ENCOUNTER — Ambulatory Visit (HOSPITAL_COMMUNITY): Payer: Medicaid Other | Attending: Orthopedic Surgery | Admitting: Speech Pathology

## 2021-01-30 ENCOUNTER — Encounter (HOSPITAL_COMMUNITY): Payer: Self-pay | Admitting: Physical Therapy

## 2021-01-30 ENCOUNTER — Ambulatory Visit (HOSPITAL_COMMUNITY): Payer: Medicaid Other | Admitting: Speech Pathology

## 2021-01-30 ENCOUNTER — Encounter (HOSPITAL_COMMUNITY): Payer: Self-pay | Admitting: Speech Pathology

## 2021-01-30 ENCOUNTER — Encounter (HOSPITAL_COMMUNITY): Payer: Medicaid Other

## 2021-01-30 ENCOUNTER — Other Ambulatory Visit: Payer: Self-pay

## 2021-01-30 DIAGNOSIS — R2689 Other abnormalities of gait and mobility: Secondary | ICD-10-CM | POA: Insufficient documentation

## 2021-01-30 DIAGNOSIS — R471 Dysarthria and anarthria: Secondary | ICD-10-CM | POA: Diagnosis not present

## 2021-01-30 DIAGNOSIS — M6281 Muscle weakness (generalized): Secondary | ICD-10-CM | POA: Insufficient documentation

## 2021-01-30 DIAGNOSIS — R41841 Cognitive communication deficit: Secondary | ICD-10-CM

## 2021-01-30 NOTE — Therapy (Signed)
Raymond Barrera, [place], [address] Phone: [phone]   Fax:  [phone]  Physical Therapy Evaluation  Patient Details  Name: Raymond Barrera. MRN: [id number] Date of Birth: [birth year] Referring Provider (PT): Phillips Odor MD   Encounter Date: 01/30/2021   PT End of Session - 01/30/21 1132    Visit Number 1    Number of Visits 8    Date for PT Re-Evaluation 02/27/21    Authorization Type Medicaid [place]    Authorization Time Period Check auth    PT Start Time 1118    PT Stop Time 1155    PT Time Calculation (min) 37 min    Equipment Utilized During Treatment Gait belt    Activity Tolerance Patient tolerated treatment well    Behavior During Therapy WFL for tasks assessed/performed           Past Medical History:  Diagnosis Date  . Ambulates with cane    straight - uses occasionally  . Anxiety    due to the stroke  . Arthritis   . Back pain    hx of buldging disc  . Complication of anesthesia    took a while for him to wake up after previous anesthesia  . Diabetes mellitus without complication ([place])   . Family history of adverse reaction to anesthesia    "sometimes mom has a hard time waking up"  . High cholesterol    takes Zocor daily  . Hypertension    takes Benazepril and HCTZ  daily  . Joint pain   . Joint swelling   . Memory impairment    occassional - from stroke  . Myocardial infarction ([place]) 1987  . Pneumonia    hx of-80's  . Shortness of breath dyspnea    do to pain  . Sleep apnea    never had a sleep study,but states Dr. Cindie Laroche says he has it  . Slurred speech   . Stroke ([facility]) 08/2013   7 mini-strokes, last stroke 2017  . TIA (transient ischemic attack) 2014   x 7   . Urinary frequency     Past Surgical History:  Procedure Laterality Date  . ABDOMINAL EXPOSURE N/A 06/16/2018   Procedure: ABDOMINAL EXPOSURE;  Surgeon: Rosetta Posner, MD;  Location: [facility];  Service:  Vascular;  Laterality: N/A;  . ANKLE SURGERY  2008   left ankle-otif-[facility]  . ANTERIOR LUMBAR FUSION Bilateral 06/16/2018   Procedure: LUMBAR 4-5 LUMBAR 5-SACRUM 1 ANTERIOR LUMBAR INTERBODY FUSION WITH INSTRUMENTATION AND ALLOGRAFT;  Surgeon: Phylliss Bob, MD;  Location: [facility];  Service: Orthopedics;  Laterality: Bilateral;  . BACK SURGERY    . HEMATOMA EVACUATION Left 08/13/2018   Procedure: EVACUATION HEMATOMA LEFT ABDOMINAL WALL;  Surgeon: Rosetta Posner, MD;  [facility];  Service: Vascular;  Laterality: Left;  . IR RADIOLOGIST EVAL & MGMT  07/27/2018  . IR US GUIDE BX ASP/DRAIN  07/14/2018  . JOINT REPLACEMENT     both hips replaced   . LUMBAR LAMINECTOMY/DECOMPRESSION MICRODISCECTOMY Right 10/17/2014   Procedure: LUMBAR LAMINECTOMY/DECOMPRESSION MICRODISCECTOMY 2 LEVELS;  Surgeon: [name], MD;  Location: [facility];  Service: Neurosurgery;  Laterality: Right;  Right L45 L5S1 laminectomy and foraminotomy  . MASS EXCISION  09/13/2012   Procedure: EXCISION MASS;  Surgeon: Jamesetta So, MD;  Location: [facility];  Service: General;  Laterality: N/A;  . ROBOTIC ASSITED PARTIAL NEPHRECTOMY Left 03/30/2019   Procedure: XI ROBOTIC ASSITED  PARTIAL NEPHRECTOMY POSSIBLE RADICAL NEPHRECTOMY;  Surgeon: Ceasar Mons, MD;  Location: [facility];  Service: Urology;  Laterality: Left;  . SHOULDER ARTHROSCOPY WITH ROTATOR CUFF REPAIR Right 04/12/2020   Procedure: right shoulder arthroscopy, debridement, mini open rotator cuff tear repair;  Surgeon: Meredith Pel, MD;  Location: [facility];  Service: Orthopedics;  Laterality: Right;  . TONSILLECTOMY    . TOTAL HIP ARTHROPLASTY Right 03/20/2015  . TOTAL HIP ARTHROPLASTY Right 03/20/2015   Procedure: TOTAL HIP ARTHROPLASTY ANTERIOR APPROACH;  Surgeon: Renette Butters, MD;  Location: [facility];  Service: Orthopedics;  Laterality: Right;  . TOTAL HIP ARTHROPLASTY Left 01/08/2016   Procedure: TOTAL HIP ARTHROPLASTY ANTERIOR APPROACH;  Surgeon:  Renette Butters, MD;  Location: [facility];  Service: Orthopedics;  Laterality: Left;    There were no vitals filed for this visit.    Subjective Assessment - 01/30/21 1129    Subjective Patient presents to physical therapy with complaint of weakness and gait instability s/p CVA in November. He is with his wife today. She says he has history of multiple strokes but most recent x 2, one around 10/31 and a second a week later around 11/7. He was admitted to ED and discharged on 11/9 . He has been having speech therapy but this is his first round of physical therapy. His wife says they wanted to get things under control first. Wife reports issues with trouble standing, weakness, leaning, poor balance. She also reports he has been having some dizziness, and is following up with MD about patient "coming close to passing out". She says he sometimes seems "out of it".    Patient is accompained by: Family member   wife   Pertinent History CVA x 2 (10/31 and 10/07/21), HTN, DM, has 1 kidney, Cx history    Limitations Lifting;Standing;Walking;House hold activities    How long can you stand comfortably? 2-3 minutes    How long can you walk comfortably? 2-3 minutes    Patient Stated Goals "Get better"    Currently in Pain? Yes    Pain Score 7     Pain Location Leg    Pain Orientation Right;Left    Pain Descriptors / Indicators Tightness;Sore    Pain Type Acute pain    Pain Onset In the past 7 days    Pain Frequency Intermittent    Aggravating Factors  "None that I know of"    Pain Relieving Factors "None that I know of"    Effect of Pain on Daily Activities Limits              [facility] PT Assessment - 01/30/21 0001      Assessment   Medical Diagnosis Balance/ weakness s/p CVA    Referring Provider (PT) Phillips Odor MD    Onset Date/Surgical Date 10/07/20    Prior Therapy Speech      Precautions   Precautions Fall      Restrictions   Weight Bearing Restrictions No      Balance Screen   Has  the patient fallen in the past 6 months Yes    How many times? 1    Has the patient had a decrease in activity level because of a fear of falling?  Yes    Is the patient reluctant to leave their home because of a fear of falling?  Yes      [place] residence    Living Arrangements Spouse/significant other  Prior Function   Level of Independence Independent      Cognition   Overall Cognitive Status History of cognitive impairments - at baseline      Strength   Strength Assessment Site Hip;Knee;Ankle    Right/Left Hip Right;Left    Right Hip Flexion 4+/5    Left Hip Flexion 3+/5    Right/Left Knee Right;Left    Right Knee Extension 5/5    Left Knee Extension 3/5    Right/Left Ankle Right;Left    Right Ankle Dorsiflexion 5/5    Left Ankle Dorsiflexion 4+/5      Transfers   Five time sit to stand comments  20.84 sec with no UE      Ambulation/Gait   Ambulation/Gait Yes    Ambulation/Gait Assistance 5: Supervision    Ambulation Distance (Feet) 340 Feet    Assistive device Small based quad cane    Gait Pattern Scissoring;Decreased step length - right;Decreased step length - left;Decreased stride length    Ambulation Surface Level;Indoor    Gait Comments 2MWT      Balance   Balance Assessed Yes      Static Standing Balance   Static Standing Balance -  Activities  Tandam Stance - Right Leg;Tandam Stance - Left Leg    Static Standing - Comment/# of Minutes 12 seconds, 15 seconds                      Objective measurements completed on examination: See above findings.               PT Education - 01/30/21 1132    Education Details on evaluation findings, POC and HEP    Person(s) Educated Patient    Methods Explanation;Handout    Comprehension Verbalized understanding            PT Short Term Goals - 01/30/21 1316      PT SHORT TERM GOAL #1   Title Patient will be independent with initial HEP and  self-management strategies to improve functional outcomes    Time 2    Period Weeks    Status New    Target Date 02/13/21             PT Long Term Goals - 01/30/21 1317      PT LONG TERM GOAL #1   Title Patient will be able to perform stand x 5 in < 15 seconds to demonstrate improvement in functional mobility and reduced risk for falls.    Time 4    Period Weeks    Status New    Target Date 02/27/21      PT LONG TERM GOAL #2   Title Patient will be able to stand at least 10-15 minutes to show improved ability to perform cooking and grooming ADLs.    Time 4    Period Weeks    Status New    Target Date 02/27/21      PT LONG TERM GOAL #3   Title Patient will be able to maintain tandem stance >30 seconds on BLEs to improve stability and reduce risk for falls    Time 4    Period Weeks    Status New    Target Date 02/27/21      PT LONG TERM GOAL #4   Title Patient will have equal to or > 4/5 MMT throughout BLEs to improve ability to perform functional mobility, stair ambulation and ADLs.    Time 4  Period Weeks    Status New    Target Date 02/27/21                  Plan - 01/30/21 1312    Clinical Impression Statement Patient is a 60 y.o. male who presents to physical therapy with complaint of gait instability and weakness s/p CVA. Patient demonstrates decreased strength, balance deficits and gait abnormalities which are negatively impacting patient ability to perform ADLs and functional mobility tasks. Patient will benefit from skilled physical therapy services to address these deficits to improve level of function with ADLs, functional mobility tasks, and reduce risk for falls.    Personal Factors and Comorbidities Comorbidity 3+    Comorbidities CVA, DM, HTN    Examination-Activity Limitations Stairs;Stand;Locomotion Level;Lift;Transfers    Examination-Participation Restrictions Art gallery manager;Yard Work    Stability/Clinical Decision Making  Stable/Uncomplicated    Designer, jewellery Low    Rehab Potential Good    PT Frequency 2x / week    PT Duration 4 weeks    PT Treatment/Interventions ADLs/Self Care Home Management;Ultrasound;Parrafin;Neuromuscular re-education;Compression bandaging;Scar mobilization;Passive range of motion;Patient/family education;Visual/perceptual remediation/compensation;Fluidtherapy;Contrast Bath;Aquatic Therapy;Biofeedback;DME Instruction;Gait training;Canalith Repostioning;Stair training;Cryotherapy;Orthotic Fit/Training;Dry needling;Energy conservation;Splinting;Functional mobility training;Manual techniques;Therapeutic activities;Vasopneumatic Device;Taping;Electrical Stimulation;Iontophoresis 4mg /ml Dexamethasone;Moist Heat;Therapeutic exercise;Traction;Balance training;Manual lymph drainage;Vestibular;Spinal Manipulations;Joint Manipulations    PT Next Visit Plan Review goals and HEP. Progress  functional strength, gait and balance. Add step ups, sidestepping, tandem on foam.    PT Home Exercise Plan Eval: heel/toe raise, sit to stand, tandem stance with support    Consulted and Agree with Plan of Care Patient;Family member/caregiver    Family Member Consulted Wife           Patient will benefit from skilled therapeutic intervention in order to improve the following deficits and impairments:     Visit Diagnosis: Other abnormalities of gait and mobility  Muscle weakness (generalized)     Problem List Patient Active Problem List   Diagnosis Date Noted  . CVA (cerebral vascular accident) ([facility]) 10/07/2020  . Syncope 04/18/2019  . Chronic systolic heart failure ([facility])   . Pain of both hip joints   . History of renal cell carcinoma   . Renal mass 03/30/2019  . Abdominal hematoma 08/13/2018  . Radiculopathy 06/16/2018  . Acute CVA (cerebrovascular accident) ([facility]) 02/01/2017  . Diabetes mellitus ([place]) 02/01/2017  . HTN (hypertension) 02/01/2017  . Primary osteoarthritis of left hip  01/08/2016  . DJD (degenerative joint disease) 03/20/2015  . Primary osteoarthritis of right hip 02/26/2015  . Lumbar spondylosis 10/17/2014  . Lumbago 07/25/2014  . Abnormality of gait 07/25/2014  . Difficulty in walking(719.7) 07/25/2014  . TIA (transient ischemic attack) 03/09/2013  . Cocaine abuse ([employer]) 03/09/2013  . Accelerated hypertension 03/09/2013  . Hyperlipidemia 03/09/2013  . Current smoker 03/09/2013    1:22 PM, 01/30/21 Josue Hector PT DPT  Physical Therapist with [facility]  [phone] [place] [address] [place], [place], [address] Phone: [phone]   Fax:  [phone]  Name: Keevin Panebianco. MRN: [id number] Date of Birth: 25-Jun-1961

## 2021-01-30 NOTE — Progress Notes (Signed)
   01/30/21 0001  Assessment  Medical Diagnosis Balance/ weakness s/p CVA  Referring Provider (PT) Phillips Odor MD  Onset Date/Surgical Date 10/07/20  Prior Therapy Speech  Precautions  Precautions Fall  Restrictions  Weight Bearing Restrictions No  Balance Screen  Has the patient fallen in the past 6 months Yes  How many times? 1  Has the patient had a decrease in activity level because of a fear of falling?  Yes  Is the patient reluctant to leave their home because of a fear of falling?  Yes  [place] residence  Living Arrangements Spouse/significant other  Prior Function  Level of Independence Independent  Cognition  Overall Cognitive Status History of cognitive impairments - at baseline  Strength  Strength Assessment Site Hip;Knee;Ankle  Right/Left Hip Right;Left  Right/Left Knee Right;Left  Right/Left Ankle Right;Left  Right Hip Flexion 4+/5  Left Hip Flexion 3+/5  Right Knee Extension 5/5  Left Knee Extension 3/5  Right Ankle Dorsiflexion 5/5  Left Ankle Dorsiflexion 4+/5  Transfers  Five time sit to stand comments  20.84 sec with no UE  Ambulation/Gait  Ambulation/Gait Yes  Ambulation/Gait Assistance 5: Supervision  Ambulation Distance (Feet) 340 Feet  Assistive device Small based quad cane  Gait Pattern Scissoring;Decreased step length - right;Decreased step length - left;Decreased stride length  Ambulation Surface Level;Indoor  Gait Comments 2MWT  Balance  Balance Assessed Yes  Static Standing Balance  Static Standing Balance -  Activities  Tandam Stance - Right Leg;Tandam Stance - Left Leg  Static Standing - Comment/# of Minutes 12 seconds, 15 seconds

## 2021-01-30 NOTE — Patient Instructions (Signed)
Access Code: [email or web address] URL: [email or web address] Date: 01/30/2021 Prepared by: Josue Hector  Exercises Sit to Stand with Counter Support - 2-3 x daily - 7 x weekly - 2 sets - 10 reps Standing Tandem Balance with Counter Support - 2-3 x daily - 7 x weekly - 1 sets - 3 reps - 20 seconds hold Heel Toe Raises with Counter Support - 2-3 x daily - 7 x weekly - 2 sets - 10 reps

## 2021-01-30 NOTE — Therapy (Signed)
Raymond Barrera, [place], [address] Phone: [phone]   Fax:  [phone]  Speech Language Pathology Evaluation  Patient Details  Name: Raymond Barrera. MRN: [id number] Date of Birth: 1961/07/08 Referring Provider (SLP): Dickey Gave, MD   Encounter Date: 01/30/2021   End of Session - 01/30/21 1244    Visit Number 1    Number of Visits 5    Date for SLP Re-Evaluation 03/07/21    Authorization Type Medicaid    Authorization Time Period will request authorization 02/04/21-03/07/21    SLP Start Time 1030    SLP Stop Time  1115    SLP Time Calculation (min) 45 min    Activity Tolerance Patient tolerated treatment well           Past Medical History:  Diagnosis Date  . Ambulates with cane    straight - uses occasionally  . Anxiety    due to the stroke  . Arthritis   . Back pain    hx of buldging disc  . Complication of anesthesia    took a while for him to wake up after previous anesthesia  . Diabetes mellitus without complication ([place])   . Family history of adverse reaction to anesthesia    "sometimes mom has a hard time waking up"  . High cholesterol    takes Zocor daily  . Hypertension    takes Benazepril and HCTZ  daily  . Joint pain   . Joint swelling   . Memory impairment    occassional - from stroke  . Myocardial infarction ([place]) 1987  . Pneumonia    hx of-80's  . Shortness of breath dyspnea    do to pain  . Sleep apnea    never had a sleep study,but states Dr. Cindie Laroche says he has it  . Slurred speech   . Stroke ([place]) 08/2013   7 mini-strokes, last stroke 2017  . TIA (transient ischemic attack) 2014   x 7   . Urinary frequency     Past Surgical History:  Procedure Laterality Date  . ABDOMINAL EXPOSURE N/A 06/16/2018   Procedure: ABDOMINAL EXPOSURE;  Surgeon: Rosetta Posner, MD;  Location: [facility];  Service: Vascular;  Laterality: N/A;  . ANKLE SURGERY  2008   left ankle-otif-[facility]  .  ANTERIOR LUMBAR FUSION Bilateral 06/16/2018   Procedure: LUMBAR 4-5 LUMBAR 5-SACRUM 1 ANTERIOR LUMBAR INTERBODY FUSION WITH INSTRUMENTATION AND ALLOGRAFT;  Surgeon: Phylliss Bob, MD;  Location: [facility];  Service: Orthopedics;  Laterality: Bilateral;  . BACK SURGERY    . HEMATOMA EVACUATION Left 08/13/2018   Procedure: EVACUATION HEMATOMA LEFT ABDOMINAL WALL;  Surgeon: Rosetta Posner, MD;  [facility];  Service: Vascular;  Laterality: Left;  . IR RADIOLOGIST EVAL & MGMT  07/27/2018  . IR US GUIDE BX ASP/DRAIN  07/14/2018  . JOINT REPLACEMENT     both hips replaced   . LUMBAR LAMINECTOMY/DECOMPRESSION MICRODISCECTOMY Right 10/17/2014   Procedure: LUMBAR LAMINECTOMY/DECOMPRESSION MICRODISCECTOMY 2 LEVELS;  Surgeon: [name], MD;  Location: [facility];  Service: Neurosurgery;  Laterality: Right;  Right L45 L5S1 laminectomy and foraminotomy  . MASS EXCISION  09/13/2012   Procedure: EXCISION MASS;  Surgeon: Jamesetta So, MD;  Location: [facility];  Service: General;  Laterality: N/A;  . ROBOTIC ASSITED PARTIAL NEPHRECTOMY Left 03/30/2019   Procedure: XI ROBOTIC ASSITED PARTIAL NEPHRECTOMY POSSIBLE RADICAL NEPHRECTOMY;  Surgeon: Ceasar Mons, MD;  Location: [facility];  Service:  Urology;  Laterality: Left;  . SHOULDER ARTHROSCOPY WITH ROTATOR CUFF REPAIR Right 04/12/2020   Procedure: right shoulder arthroscopy, debridement, mini open rotator cuff tear repair;  Surgeon: Meredith Pel, MD;  Location: [facility];  Service: Orthopedics;  Laterality: Right;  . TONSILLECTOMY    . TOTAL HIP ARTHROPLASTY Right 03/20/2015  . TOTAL HIP ARTHROPLASTY Right 03/20/2015   Procedure: TOTAL HIP ARTHROPLASTY ANTERIOR APPROACH;  Surgeon: Renette Butters, MD;  Location: [facility];  Service: Orthopedics;  Laterality: Right;  . TOTAL HIP ARTHROPLASTY Left 01/08/2016   Procedure: TOTAL HIP ARTHROPLASTY ANTERIOR APPROACH;  Surgeon: Renette Butters, MD;  Location: [facility];  Service: Orthopedics;  Laterality: Left;     There were no vitals filed for this visit.   Subjective Assessment - 01/30/21 1228    Subjective "I've had a lot going on and couldn't come."              SLP Evaluation [facility] - 01/30/21 1228      SLP Visit Information   SLP Received On 01/30/21    Referring Provider (SLP) Dickey Gave, MD    Onset Date 09/28/2020    Medical Diagnosis infarct left corpus callosum      Subjective   Subjective "I need to talk better."    Patient/Family Stated Goal improve speech      Pain Assessment   Currently in Pain? --      General Information   HPI Raymond Barrera. is a 60 y.o. male with medical history significant for TIAs with prior CVA, type 2 diabetes, CKD 3B, dyslipidemia, hypertension, prior MI, sleep apnea, and renal cell carcinoma status post prior nephrectomy who presented to the ED at the urging of his wife for slurred speech that began approximately 7 days prior. He seems to think that his symptoms began approximately around 10/29. He is noted to have some residual weakness greater on the right side from a CVA on 01/2019. He is also noted to have blood sugar and blood pressure elevations at home, all despite taking his usual home medications. 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 He was discharged home on 10/09/20. He was referred for SLE by Dr. Merlene Laughter with a diagnosis of dysarthria. Pt was evaluated on 10/18/2020 and seen for 2 treatments, but was discharged following due to no shows and cancelations. He obtained a new order and is back for therapy.    Behavioral/Cognition Alert and cooperative    Mobility Status with assistive device      Balance Screen   Has the patient fallen in the past 6 months Yes    How many times? 1    Has the patient had a decrease in activity level because of a fear of falling?  Yes    Is the  patient reluctant to leave their home because of a fear of falling?  Yes      Prior Functional Status   Cognitive/Linguistic Baseline Baseline deficits    Baseline deficit details memory deficits from previous strokes    Type of Home Mobile home     Lives With Spouse    Available Support Family    Education 12th grade   Pt does not read   Vocation On disability      Pain Assessment   Pain Assessment No/denies pain      Cognition  Overall Cognitive Status History of cognitive impairments - at baseline    Attention Sustained    Sustained Attention Impaired    Sustained Attention Impairment Verbal complex    Memory Impaired    Memory Impairment Storage deficit;Retrieval deficit    Awareness Impaired    Awareness Impairment Emergent impairment    Problem Solving Impaired    Problem Solving Impairment Verbal complex    Executive Function Self Monitoring    Self Monitoring Impaired    Self Monitoring Impairment Verbal complex;Functional complex      Auditory Comprehension   Overall Auditory Comprehension Appears within functional limits for tasks assessed    Yes/No Questions Within Functional Limits    Commands Within Functional Limits    Conversation Simple    Interfering Components Working memory    Soil scientist Within Function Limits      Reading Comprehension   Reading Status --   Pt reports that he is illiterate     Expression   Primary Mode of Expression Verbal      Verbal Expression   Overall Verbal Expression Impaired    Initiation No impairment    Automatic Speech Name;Social Response;Day of week    Level of Generative/Spontaneous Verbalization Sentence    Repetition Impaired    Level of Impairment Phrase level    Naming Impairment    Responsive 76-100% accurate    Confrontation 75-100% accurate    Convergent 75-100% accurate    Divergent 75-100% accurate    Pragmatics No impairment     Interfering Components Speech intelligibility    Effective Techniques Phonemic cues    Non-Verbal Means of Communication Not applicable      Written Expression   Dominant Hand Right    Written Expression Not tested      Oral Motor/Sensory Function   Overall Oral Motor/Sensory Function Impaired    Labial ROM Reduced right    Labial Symmetry Abnormal symmetry right    Labial Strength Within Functional Limits    Labial Sensation Within Functional Limits    Labial Coordination Reduced    Lingual ROM Within Functional Limits    Lingual Symmetry Within Functional Limits    Lingual Strength Within Functional Limits    Lingual Coordination Reduced    Facial ROM Within Functional Limits    Velum Within Functional Limits    Mandible Within Functional Limits      Motor Speech   Overall Motor Speech Impaired    Respiration Impaired    Level of Impairment Phrase    Phonation --   periods of glottal fry, muscle tension   Resonance Within functional limits    Articulation Impaired    Level of Impairment Phrase    Intelligibility Intelligibility reduced    Word 75-100% accurate    Phrase 50-74% accurate    Sentence 50-74% accurate    Conversation 50-74% accurate    Motor Planning Witnin functional limits    Motor Speech Errors Aware    Interfering Components --   dentures   Effective Techniques Slow rate;Increased vocal intensity;Over-articulate;Pause    Phonation Impaired    Vocal Abuses Glottal Attack    Tension Present Neck    Volume Appropriate    Pitch Appropriate      Plan   Duration 4 weeks              SLP Education - 01/30/21 1243    Education Details Provided visual cues  for speech intelligibility strategies, as Pt unable to read    Person(s) Educated Patient;Spouse    Methods Explanation;Handout;Demonstration    Comprehension Verbalized understanding            SLP Short Term Goals - 01/30/21 1247      SLP SHORT TERM GOAL #1   Title Pt will increase  conversation level speech intelligibility to 80% by utilizing appropriate strategies and listener needing repetition no more than 3x in a given conversation.    Baseline 75% intelligibility    Time 4    Period Weeks    Status New    Target Date 03/07/21      SLP SHORT TERM GOAL #2   Title Pt will implement word-finding strategies with 90% accuracy when unable to verbalize desired word in conversation/functional tasks with mi/mod assist.    Baseline 75%    Time 4    Period Weeks    Status New    Target Date 03/07/21      SLP SHORT TERM GOAL #3   Title Pt will complete moderate level verbal expression tasks (object description, state function, provide short summary, etc) using short sentences with 90% acc and min assist.    Baseline 75%    Time 4    Period Weeks    Status New    Target Date 03/07/21      SLP SHORT TERM GOAL #4   Title Pt will utilize speech intelligibility strategies (over articulation, reduced speaking rate, and vocal intensity) at the word/sentence/phrase level with 80% acc and min cues.    Baseline 75% intelligible given sentences to repeat    Time 4    Period Weeks    Status New    Target Date 03/07/21            SLP Long Term Goals - 01/30/21 1249      SLP LONG TERM GOAL #1   Title same as short term goals            Plan - 01/30/21 1247    Clinical Impression Statement Mild/moderate dysarthria persists in addition to expressive language deficits which negatively impact expressive communication. Pt has limited literacy skills and needs picture, object, SLP modeling to complete most tasks. He relies on [employer] for transportation and may have difficulty attending therapy consistently. He was evaluated in mid November by this SLP and only attended two treatment session due to transportation difficulties and new stressors at home. He and his wife indicate that he is motivated to attend therapy to address his speech deficits. Will request 4 visits to be used  once per week to continue previous goals that were never fully addressed due to the above stated attendance issues. Pt and spouse are in agreement with plan of care.    Speech Therapy Frequency 1x /week    Duration 4 weeks    Treatment/Interventions Compensatory strategies;Patient/family education;Cueing hierarchy;Multimodal communcation approach;Compensatory techniques;SLP instruction and feedback;Internal/external aids    Potential to Achieve Goals Fair    Potential Considerations Ability to learn/carryover information    SLP Home Exercise Plan Pt will complete HEP as assigned to facilitate carryover of treatment strategies and techniques in the home environment    Consulted and Agree with Plan of Care Patient           Patient will benefit from skilled therapeutic intervention in order to improve the following deficits and impairments:   Dysarthria and anarthria  Cognitive communication deficit    Problem List  Patient Active Problem List   Diagnosis Date Noted  . CVA (cerebral vascular accident) ([place]) 10/07/2020  . Syncope 04/18/2019  . Chronic systolic heart failure ([place])   . Pain of both hip joints   . History of renal cell carcinoma   . Renal mass 03/30/2019  . Abdominal hematoma 08/13/2018  . Radiculopathy 06/16/2018  . Acute CVA (cerebrovascular accident) ([place]) 02/01/2017  . Diabetes mellitus ([place]) 02/01/2017  . HTN (hypertension) 02/01/2017  . Primary osteoarthritis of left hip 01/08/2016  . DJD (degenerative joint disease) 03/20/2015  . Primary osteoarthritis of right hip 02/26/2015  . Lumbar spondylosis 10/17/2014  . Lumbago 07/25/2014  . Abnormality of gait 07/25/2014  . Difficulty in walking(719.7) 07/25/2014  . TIA (transient ischemic attack) 03/09/2013  . Cocaine abuse ([place]) 03/09/2013  . Accelerated hypertension 03/09/2013  . Hyperlipidemia 03/09/2013  . Current smoker 03/09/2013   Thank you,  Genene Churn,  [place]  [facility] 01/30/2021, 12:50 PM  [facility] [address] [place], [place], [address] Phone: [phone]   Fax:  [phone]  Name: Drury Ardizzone. MRN: [id number] Date of Birth: 1961-05-16

## 2021-02-03 NOTE — Telephone Encounter (Signed)
Been a while since we've seen him, recommend he consult his PCP if he wants to discuss longterm usage of gabapentin

## 2021-02-04 ENCOUNTER — Ambulatory Visit (HOSPITAL_COMMUNITY): Payer: Medicaid Other

## 2021-02-07 ENCOUNTER — Ambulatory Visit (HOSPITAL_COMMUNITY): Payer: Medicaid Other | Admitting: Speech Pathology

## 2021-02-07 ENCOUNTER — Ambulatory Visit (HOSPITAL_COMMUNITY): Payer: Medicaid Other

## 2021-02-11 ENCOUNTER — Encounter (HOSPITAL_COMMUNITY): Payer: Medicaid Other | Admitting: Physical Therapy

## 2021-02-13 ENCOUNTER — Encounter (HOSPITAL_COMMUNITY): Payer: Medicaid Other | Admitting: Physical Therapy

## 2021-02-13 ENCOUNTER — Ambulatory Visit (HOSPITAL_COMMUNITY): Payer: Medicaid Other | Admitting: Speech Pathology

## 2021-02-18 ENCOUNTER — Other Ambulatory Visit: Payer: Self-pay

## 2021-02-18 ENCOUNTER — Ambulatory Visit (HOSPITAL_COMMUNITY): Payer: Medicaid Other | Admitting: Speech Pathology

## 2021-02-18 ENCOUNTER — Encounter (HOSPITAL_COMMUNITY): Payer: Self-pay | Admitting: Speech Pathology

## 2021-02-18 DIAGNOSIS — R471 Dysarthria and anarthria: Secondary | ICD-10-CM

## 2021-02-18 DIAGNOSIS — R41841 Cognitive communication deficit: Secondary | ICD-10-CM

## 2021-02-18 NOTE — Therapy (Signed)
[facility] [place], [place], [address] Phone: [phone]   Fax:  [phone]  Speech Language Pathology Treatment  Patient Details  Name: Raymond Barrera. MRN: [id number] Date of Birth: 1961-12-01 Referring Provider (SLP): Dickey Gave, MD   Encounter Date: 02/18/2021   End of Session - 02/18/21 0933    Visit Number 2    Number of Visits 5    Date for SLP Re-Evaluation 03/03/21    Authorization Type Medicaid    Authorization Time Period authorization 02/04/21-03/03/21    Authorization - Visit Number 1    Authorization - Number of Visits 4    SLP Start Time 0900    SLP Stop Time  0945    SLP Time Calculation (min) 45 min    Activity Tolerance Patient tolerated treatment well           Past Medical History:  Diagnosis Date  . Ambulates with cane    straight - uses occasionally  . Anxiety    due to the stroke  . Arthritis   . Back pain    hx of buldging disc  . Complication of anesthesia    took a while for him to wake up after previous anesthesia  . Diabetes mellitus without complication ([place])   . Family history of adverse reaction to anesthesia    "sometimes mom has a hard time waking up"  . High cholesterol    takes Zocor daily  . Hypertension    takes Benazepril and HCTZ  daily  . Joint pain   . Joint swelling   . Memory impairment    occassional - from stroke  . Myocardial infarction ([place]) 1987  . Pneumonia    hx of-80's  . Shortness of breath dyspnea    do to pain  . Sleep apnea    never had a sleep study,but states Dr. Cindie Laroche says he has it  . Slurred speech   . Stroke ([place]) 08/2013   7 mini-strokes, last stroke 2017  . TIA (transient ischemic attack) 2014   x 7   . Urinary frequency     Past Surgical History:  Procedure Laterality Date  . ABDOMINAL EXPOSURE N/A 06/16/2018   Procedure: ABDOMINAL EXPOSURE;  Surgeon: Rosetta Posner, MD;  Location: [facility];  Service: Vascular;  Laterality:  N/A;  . ANKLE SURGERY  2008   left ankle-otif-[facility]  . ANTERIOR LUMBAR FUSION Bilateral 06/16/2018   Procedure: LUMBAR 4-5 LUMBAR 5-SACRUM 1 ANTERIOR LUMBAR INTERBODY FUSION WITH INSTRUMENTATION AND ALLOGRAFT;  Surgeon: Phylliss Bob, MD;  Location: [facility];  Service: Orthopedics;  Laterality: Bilateral;  . BACK SURGERY    . HEMATOMA EVACUATION Left 08/13/2018   Procedure: EVACUATION HEMATOMA LEFT ABDOMINAL WALL;  Surgeon: Rosetta Posner, MD;  [facility];  Service: Vascular;  Laterality: Left;  . IR RADIOLOGIST EVAL & MGMT  07/27/2018  . IR US GUIDE BX ASP/DRAIN  07/14/2018  . JOINT REPLACEMENT     both hips replaced   . LUMBAR LAMINECTOMY/DECOMPRESSION MICRODISCECTOMY Right 10/17/2014   Procedure: LUMBAR LAMINECTOMY/DECOMPRESSION MICRODISCECTOMY 2 LEVELS;  Surgeon: [name], MD;  Location: [facility];  Service: Neurosurgery;  Laterality: Right;  Right L45 L5S1 laminectomy and foraminotomy  . MASS EXCISION  09/13/2012   Procedure: EXCISION MASS;  Surgeon: Jamesetta So, MD;  [facility];  Service: General;  Laterality: N/A;  . ROBOTIC ASSITED PARTIAL NEPHRECTOMY Left 03/30/2019   Procedure: XI ROBOTIC ASSITED PARTIAL NEPHRECTOMY  POSSIBLE RADICAL NEPHRECTOMY;  Surgeon: Ceasar Mons, MD;  [facility];  Service: Urology;  Laterality: Left;  . SHOULDER ARTHROSCOPY WITH ROTATOR CUFF REPAIR Right 04/12/2020   Procedure: right shoulder arthroscopy, debridement, mini open rotator cuff tear repair;  Surgeon: Meredith Pel, MD;  Location: [facility];  Service: Orthopedics;  Laterality: Right;  . TONSILLECTOMY    . TOTAL HIP ARTHROPLASTY Right 03/20/2015  . TOTAL HIP ARTHROPLASTY Right 03/20/2015   Procedure: TOTAL HIP ARTHROPLASTY ANTERIOR APPROACH;  Surgeon: Renette Butters, MD;  Location: [facility];  Service: Orthopedics;  Laterality: Right;  . TOTAL HIP ARTHROPLASTY Left 01/08/2016   Procedure: TOTAL HIP ARTHROPLASTY ANTERIOR APPROACH;  Surgeon: Renette Butters, MD;   Location: [facility];  Service: Orthopedics;  Laterality: Left;    There were no vitals filed for this visit.   Subjective Assessment - 02/18/21 0925    Subjective "I have been seeing things."    Currently in Pain? No/denies             ADULT SLP TREATMENT - 02/18/21 0930      General Information   Behavior/Cognition Cooperative;Alert;Pleasant mood   inappropriate laughter   Patient Positioning Upright in chair    Oral care provided N/A    HPI Raymond Barrera. is a 60 y.o. male with medical history significant for TIAs with prior CVA, type 2 diabetes, CKD 3B, dyslipidemia, hypertension, prior MI, sleep apnea, and renal cell carcinoma status post prior nephrectomy who presented to the ED at the urging of his wife for slurred speech that began approximately 7 days prior. He seems to think that his symptoms began approximately around 10/29 and he has been stubborn about presenting to the ED for further evaluation despite knowing that these could be stroke like symptoms. He has also had some difficulty walking with his cane at baseline and he has had some weakness in both of his hands prohibiting some fine motor function. He does not have any new symptoms over the last 24 h, but his symptoms have persisted over the last several days. His speech is normally clear otherwise. He denies any headaches, but does have some intermittent blurry vision. He denies any nausea or vomiting, chest pain, shortness of breath, fevers, or chills. He is noted to have some residual weakness greater on the right side from a CVA on 01/2019. He is also noted to have blood sugar and blood pressure elevations at home, all despite taking his usual home medications. 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 He was discharged home on 10/09/20. He was referred for SLE by Dr. Merlene Laughter with  a diagnosis of dysarthria.      Treatment Provided   Treatment provided Cognitive-Linquistic      Pain Assessment   Pain Assessment No/denies pain      Cognitive-Linquistic Treatment   Treatment focused on Aphasia;Dysarthria;Patient/family/caregiver education    Skilled Treatment SLP provided cues for breath support to reduce glottal fry, introduced straw breathing for breath support, speech intelligibility (pauses, over articulation, pacing), and word finding strategies.      Assessment / Recommendations / Plan   Plan Continue with current plan of care      Progression Toward Goals   Progression toward goals Progressing toward goals            SLP Education - 02/18/21 0932  Education Details establish routines for memory, practice speech intelligiblity strategies at home with set of pictures provided    Person(s) Educated Patient    Methods Explanation;Handout    Comprehension Verbalized understanding            SLP Short Term Goals - 02/18/21 0935      SLP SHORT TERM GOAL #1   Title Pt will increase conversation level speech intelligibility to 80% by utilizing appropriate strategies and listener needing repetition no more than 3x in a given conversation.    Baseline 75% intelligibility    Time 4    Period Weeks    Status On-going    Target Date 03/03/21      SLP SHORT TERM GOAL #2   Title Pt will implement word-finding strategies with 90% accuracy when unable to verbalize desired word in conversation/functional tasks with mi/mod assist.    Baseline 75%    Time 4    Period Weeks    Status On-going    Target Date 03/03/21      SLP SHORT TERM GOAL #3   Title Pt will complete moderate level verbal expression tasks (object description, state function, provide short summary, etc) using short sentences with 90% acc and min assist.    Baseline 75%    Time 4    Period Weeks    Status On-going    Target Date 03/03/21      SLP SHORT TERM GOAL #4   Title Pt will  utilize speech intelligibility strategies (over articulation, reduced speaking rate, and vocal intensity) at the word/sentence/phrase level with 80% acc and min cues.    Baseline 75% intelligible given sentences to repeat    Time 4    Period Weeks    Status On-going    Target Date 03/07/21            SLP Long Term Goals - 02/18/21 0937      SLP LONG TERM GOAL #1   Title same as short term goals            Plan - 02/18/21 0935    Clinical Impression Statement Pt canceled some appointments due to internal and external stressors. He has now rescheduled, however Medicaid authorization ends 03/03/21. SLP will request and extension to use remaining visits. He has seen his neurologist and medications were recently changed (Neudexta and Cymbalta). He describes having hallucinations and his neurologist is aware. He appears greatly bothered by this. His speech was less intelligible today. He required mod cues to implement speech intelligibility strategies using picture cues due to illiteracy. Pt also reports difficulty remembering things, however this is difficult to address as he is unable to write information down or follow a written routine/to do list. SLP emphasized the importance of establishing daily routines to help with his memory/recall.    Speech Therapy Frequency 1x /week    Duration 4 weeks    Treatment/Interventions Compensatory strategies;Patient/family education;Cueing hierarchy;Multimodal communcation approach;Compensatory techniques;SLP instruction and feedback;Internal/external aids    Potential to Achieve Goals Fair    Potential Considerations Ability to learn/carryover information    SLP Home Exercise Plan Pt will complete HEP as assigned to facilitate carryover of treatment strategies and techniques in the home environment    Consulted and Agree with Plan of Care Patient           Patient will benefit from skilled therapeutic intervention in order to improve the following  deficits and impairments:   Dysarthria and anarthria  Cognitive communication  deficit    Problem List Patient Active Problem List   Diagnosis Date Noted  . CVA (cerebral vascular accident) ([place]) 10/07/2020  . Syncope 04/18/2019  . Chronic systolic heart failure ([place])   . Pain of both hip joints   . History of renal cell carcinoma   . Renal mass 03/30/2019  . Abdominal hematoma 08/13/2018  . Radiculopathy 06/16/2018  . Acute CVA (cerebrovascular accident) ([place]) 02/01/2017  . Diabetes mellitus ([place]) 02/01/2017  . HTN (hypertension) 02/01/2017  . Primary osteoarthritis of left hip 01/08/2016  . DJD (degenerative joint disease) 03/20/2015  . Primary osteoarthritis of right hip 02/26/2015  . Lumbar spondylosis 10/17/2014  . Lumbago 07/25/2014  . Abnormality of gait 07/25/2014  . Difficulty in walking(719.7) 07/25/2014  . TIA (transient ischemic attack) 03/09/2013  . Cocaine abuse ([place]) 03/09/2013  . Accelerated hypertension 03/09/2013  . Hyperlipidemia 03/09/2013  . Current smoker 03/09/2013   Thank you,  Genene Churn, [employer]  [facility] 02/18/2021, 9:39 AM  [facility] [address] [place], [place], [address] Phone: [phone]   Fax:  [phone]   Name: Nasean Zapf. MRN: [id number] Date of Birth: [birth year]

## 2021-02-19 ENCOUNTER — Encounter (HOSPITAL_COMMUNITY): Payer: Medicaid Other | Admitting: Physical Therapy

## 2021-02-20 ENCOUNTER — Ambulatory Visit (HOSPITAL_COMMUNITY): Payer: Medicaid Other | Admitting: Physical Therapy

## 2021-02-21 ENCOUNTER — Encounter (HOSPITAL_COMMUNITY): Payer: Medicaid Other

## 2021-02-21 ENCOUNTER — Ambulatory Visit (HOSPITAL_COMMUNITY): Payer: Medicaid Other | Admitting: Speech Pathology

## 2021-02-26 ENCOUNTER — Ambulatory Visit (HOSPITAL_COMMUNITY): Payer: Medicaid Other | Admitting: Speech Pathology

## 2021-02-26 ENCOUNTER — Ambulatory Visit (HOSPITAL_COMMUNITY): Payer: Medicaid Other | Admitting: Physical Therapy

## 2021-02-26 ENCOUNTER — Encounter (HOSPITAL_COMMUNITY): Payer: Medicaid Other | Admitting: Physical Therapy

## 2021-02-28 ENCOUNTER — Encounter (HOSPITAL_COMMUNITY): Payer: Self-pay | Admitting: Speech Pathology

## 2021-02-28 ENCOUNTER — Encounter (HOSPITAL_COMMUNITY): Payer: Self-pay | Admitting: Physical Therapy

## 2021-02-28 ENCOUNTER — Ambulatory Visit (HOSPITAL_COMMUNITY): Payer: Medicaid Other | Admitting: Physical Therapy

## 2021-02-28 ENCOUNTER — Ambulatory Visit (HOSPITAL_COMMUNITY): Payer: Medicaid Other | Admitting: Speech Pathology

## 2021-02-28 ENCOUNTER — Encounter (HOSPITAL_COMMUNITY): Payer: Medicaid Other

## 2021-02-28 ENCOUNTER — Other Ambulatory Visit: Payer: Self-pay

## 2021-02-28 DIAGNOSIS — R41841 Cognitive communication deficit: Secondary | ICD-10-CM

## 2021-02-28 DIAGNOSIS — R471 Dysarthria and anarthria: Secondary | ICD-10-CM

## 2021-02-28 DIAGNOSIS — M6281 Muscle weakness (generalized): Secondary | ICD-10-CM

## 2021-02-28 DIAGNOSIS — R2689 Other abnormalities of gait and mobility: Secondary | ICD-10-CM

## 2021-02-28 NOTE — Therapy (Signed)
[facility] [place], [place], [address] Phone: [phone]   Fax:  [phone]  Physical Therapy Treatment  Patient Details  Name: Raymond Barrera. MRN: [id number] Date of Birth: [birth year] Referring Provider (PT): Phillips Odor MD  Progress Note Reporting Period 01/30/21 to 02/28/21  See note below for Objective Data and Assessment of Progress/Goals.       Encounter Date: 02/28/2021   PT End of Session - 02/28/21 1050    Visit Number 2    Number of Visits 5    Date for PT Re-Evaluation 03/22/21    Authorization Type Medicaid [place]    Authorization Time Period Check auth    PT Start Time 2992    PT Stop Time 1115    PT Time Calculation (min) 35 min    Equipment Utilized During Treatment Gait belt    Activity Tolerance Patient tolerated treatment well    Behavior During Therapy WFL for tasks assessed/performed           Past Medical History:  Diagnosis Date  . Ambulates with cane    straight - uses occasionally  . Anxiety    due to the stroke  . Arthritis   . Back pain    hx of buldging disc  . Complication of anesthesia    took a while for him to wake up after previous anesthesia  . Diabetes mellitus without complication ([place])   . Family history of adverse reaction to anesthesia    "sometimes mom has a hard time waking up"  . High cholesterol    takes Zocor daily  . Hypertension    takes Benazepril and HCTZ  daily  . Joint pain   . Joint swelling   . Memory impairment    occassional - from stroke  . Myocardial infarction ([place]) 1987  . Pneumonia    hx of-80's  . Shortness of breath dyspnea    do to pain  . Sleep apnea    never had a sleep study,but states Dr. Cindie Laroche says he has it  . Slurred speech   . Stroke ([place]) 08/2013   7 mini-strokes, last stroke 2017  . TIA (transient ischemic attack) 2014   x 7   . Urinary frequency     Past Surgical History:  Procedure Laterality Date  .  ABDOMINAL EXPOSURE N/A 06/16/2018   Procedure: ABDOMINAL EXPOSURE;  Surgeon: Rosetta Posner, MD;  Location: [facility];  Service: Vascular;  Laterality: N/A;  . ANKLE SURGERY  2008   left ankle-otif-[facility]  . ANTERIOR LUMBAR FUSION Bilateral 06/16/2018   Procedure: LUMBAR 4-5 LUMBAR 5-SACRUM 1 ANTERIOR LUMBAR INTERBODY FUSION WITH INSTRUMENTATION AND ALLOGRAFT;  Surgeon: Phylliss Bob, MD;  Location: [facility];  Service: Orthopedics;  Laterality: Bilateral;  . BACK SURGERY    . HEMATOMA EVACUATION Left 08/13/2018   Procedure: EVACUATION HEMATOMA LEFT ABDOMINAL WALL;  Surgeon: Rosetta Posner, MD;  [facility];  Service: Vascular;  Laterality: Left;  . IR RADIOLOGIST EVAL & MGMT  07/27/2018  . IR US GUIDE BX ASP/DRAIN  07/14/2018  . JOINT REPLACEMENT     both hips replaced   . LUMBAR LAMINECTOMY/DECOMPRESSION MICRODISCECTOMY Right 10/17/2014   Procedure: LUMBAR LAMINECTOMY/DECOMPRESSION MICRODISCECTOMY 2 LEVELS;  Surgeon: [name], MD;  Location: [facility];  Service: Neurosurgery;  Laterality: Right;  Right L45 L5S1 laminectomy and foraminotomy  . MASS EXCISION  09/13/2012   Procedure: EXCISION MASS;  Surgeon: Jamesetta So, MD;  [facility];  Service: General;  Laterality: N/A;  . ROBOTIC ASSITED PARTIAL NEPHRECTOMY Left 03/30/2019   Procedure: XI ROBOTIC ASSITED PARTIAL NEPHRECTOMY POSSIBLE RADICAL NEPHRECTOMY;  Surgeon: Ceasar Mons, MD;  [facility];  Service: Urology;  Laterality: Left;  . SHOULDER ARTHROSCOPY WITH ROTATOR CUFF REPAIR Right 04/12/2020   Procedure: right shoulder arthroscopy, debridement, mini open rotator cuff tear repair;  Surgeon: Meredith Pel, MD;  Location: [facility];  Service: Orthopedics;  Laterality: Right;  . TONSILLECTOMY    . TOTAL HIP ARTHROPLASTY Right 03/20/2015  . TOTAL HIP ARTHROPLASTY Right 03/20/2015   Procedure: TOTAL HIP ARTHROPLASTY ANTERIOR APPROACH;  Surgeon: Renette Butters, MD;  Location: [facility];  Service: Orthopedics;   Laterality: Right;  . TOTAL HIP ARTHROPLASTY Left 01/08/2016   Procedure: TOTAL HIP ARTHROPLASTY ANTERIOR APPROACH;  Surgeon: Renette Butters, MD;  Location: [facility];  Service: Orthopedics;  Laterality: Left;    There were no vitals filed for this visit.   Subjective Assessment - 02/28/21 1049    Subjective Patient returns to therapy after 4 week absence. He says this was due in relation to decreased motivation. He states "the stroke had me down". He says he still feels he needs therapy "as much as I can". Says he is still having trouble with his balance.    Patient is accompained by: --    Pertinent History CVA x 2 (10/31 and 10/07/21), HTN, DM, has 1 kidney, Cx history    Limitations Lifting;Standing;Walking;House hold activities    How long can you stand comfortably? "about 20-30 minutes"    How long can you walk comfortably? --    Patient Stated Goals "Get better"    Currently in Pain? Yes    Pain Score 7     Pain Location Leg    Pain Orientation Right;Left    Pain Descriptors / Indicators Sore;Tightness;Burning    Pain Type Acute pain    Pain Onset More than a month ago    Pain Frequency Intermittent    Aggravating Factors  "None that I know of"    Pain Relieving Factors "None that I know of"    Effect of Pain on Daily Activities Limits              [facility] PT Assessment - 02/28/21 0001      Assessment   Medical Diagnosis Balance/ weakness s/p CVA    Referring Provider (PT) Phillips Odor MD      Restrictions   Weight Bearing Restrictions No      Balance Screen   Has the patient fallen in the past 6 months No      [place] residence    Living Arrangements Spouse/significant other      Prior Function   Level of Independence Independent      Cognition   Overall Cognitive Status History of cognitive impairments - at baseline      Strength   Right Hip Flexion 4+/5    Left Hip Flexion 4/5    Right Knee Extension 4/5    Left Knee  Extension 4+/5    Right Ankle Dorsiflexion 5/5    Left Ankle Dorsiflexion 4+/5      Transfers   Five time sit to stand comments  18 sec with no UE      Ambulation/Gait   Ambulation/Gait Yes    Ambulation/Gait Assistance 5: Supervision    Ambulation Distance (Feet) 450 Feet    Assistive  device None    Gait Pattern Decreased arm swing - right;Decreased dorsiflexion - right;Decreased stride length;Decreased stance time - right;Poor foot clearance - right    Ambulation Surface Level;Indoor    Gait Comments 2MWT      Static Standing Balance   Static Standing Balance -  Activities  Tandam Stance - Right Leg;Tandam Stance - Left Leg    Static Standing - Comment/# of Minutes 15 sec, 7 sec                                 PT Education - 02/28/21 1110    Education Details on reassessment findings and compliance with therapy visits.    Person(s) Educated Patient    Methods Explanation    Comprehension Verbalized understanding            PT Short Term Goals - 02/28/21 1108      PT SHORT TERM GOAL #1   Title Patient will be independent with initial HEP and self-management strategies to improve functional outcomes    Time 2    Period Weeks    Status On-going    Target Date 02/13/21             PT Long Term Goals - 02/28/21 1109      PT LONG TERM GOAL #1   Title Patient will be able to perform stand x 5 in < 15 seconds to demonstrate improvement in functional mobility and reduced risk for falls.    Baseline Current 18 sec with no UE    Time 4    Period Weeks    Status On-going      PT LONG TERM GOAL #2   Title Patient will be able to stand at least 10-15 minutes to show improved ability to perform cooking and grooming ADLs.    Baseline Reports 20-30 minutes    Time 4    Period Weeks    Status Achieved      PT LONG TERM GOAL #3   Title Patient will be able to maintain tandem stance >30 seconds on BLEs to improve stability and reduce risk for falls     Baseline See balance    Time 4    Period Weeks    Status On-going      PT LONG TERM GOAL #4   Title Patient will have equal to or > 4/5 MMT throughout BLEs to improve ability to perform functional mobility, stair ambulation and ADLs.    Baseline See MMT    Time 4    Period Weeks    Status Achieved                 Plan - 02/28/21 1104    Clinical Impression Statement Patient returns after 4 week absence, citing some level of depression/ decreased motivation. Patient says he has been practicing exercises issued at evaluation. He shows some improvement in gait and LE strength, but continues to be limited by balance and fatigue. Patient would continue to benefit from skilled therapy services to address remaining deficits to improve functional mobility and reduce risk for falls. Educated patient on compliance and adherence with therapy visits. Patient agrees with this plan.    Personal Factors and Comorbidities Comorbidity 3+    Comorbidities CVA, DM, HTN    Examination-Activity Limitations Stairs;Stand;Locomotion Level;Lift;Transfers    Examination-Participation Restrictions Art gallery manager;Yard Work    Stability/Clinical Decision Making Stable/Uncomplicated  Rehab Potential Good    PT Frequency 1x / week    PT Duration 3 weeks    PT Treatment/Interventions ADLs/Self Care Home Management;Ultrasound;Parrafin;Neuromuscular re-education;Compression bandaging;Scar mobilization;Passive range of motion;Patient/family education;Visual/perceptual remediation/compensation;Fluidtherapy;Contrast Bath;Aquatic Therapy;Biofeedback;DME Instruction;Gait training;Canalith Repostioning;Stair training;Cryotherapy;Orthotic Fit/Training;Dry needling;Energy conservation;Splinting;Functional mobility training;Manual techniques;Therapeutic activities;Vasopneumatic Device;Taping;Electrical Stimulation;Iontophoresis 4mg /ml Dexamethasone;Moist Heat;Therapeutic exercise;Traction;Balance  training;Manual lymph drainage;Vestibular;Spinal Manipulations;Joint Manipulations    PT Next Visit Plan Progress  functional strength, gait and balance. Add step ups, sidestepping, tandem on foam.    PT Home Exercise Plan Eval: heel/toe raise, sit to stand, tandem stance with support    Consulted and Agree with Plan of Care Patient    Family Member Consulted --           Patient will benefit from skilled therapeutic intervention in order to improve the following deficits and impairments:  Abnormal gait,Decreased balance,Difficulty walking,Decreased activity tolerance,Decreased endurance,Decreased strength  Visit Diagnosis: Other abnormalities of gait and mobility  Muscle weakness (generalized)     Problem List Patient Active Problem List   Diagnosis Date Noted  . CVA (cerebral vascular accident) ([place]) 10/07/2020  . Syncope 04/18/2019  . Chronic systolic heart failure ([place])   . Pain of both hip joints   . History of renal cell carcinoma   . Renal mass 03/30/2019  . Abdominal hematoma 08/13/2018  . Radiculopathy 06/16/2018  . Acute CVA (cerebrovascular accident) ([place]) 02/01/2017  . Diabetes mellitus ([place]) 02/01/2017  . HTN (hypertension) 02/01/2017  . Primary osteoarthritis of left hip 01/08/2016  . DJD (degenerative joint disease) 03/20/2015  . Primary osteoarthritis of right hip 02/26/2015  . Lumbar spondylosis 10/17/2014  . Lumbago 07/25/2014  . Abnormality of gait 07/25/2014  . Difficulty in walking(719.7) 07/25/2014  . TIA (transient ischemic attack) 03/09/2013  . Cocaine abuse ([place]) 03/09/2013  . Accelerated hypertension 03/09/2013  . Hyperlipidemia 03/09/2013  . Current smoker 03/09/2013    11:14 AM, 02/28/21 Josue Hector PT DPT  Physical Therapist with [facility]  [phone] [place] [address] [place], [place], [address] Phone: [phone]   Fax:  [phone]  Name:  Raymond Barrera. MRN: [id number] Date of Birth: 1961/04/02

## 2021-02-28 NOTE — Therapy (Signed)
[place], [place], [address] Phone: [phone]   Fax:  [phone]  Speech Language Pathology Treatment  Patient Details  Name: Raymond Barrera. MRN: [id number] Date of Birth: May 18, 1961 Referring Provider (SLP): Dickey Gave, MD   Encounter Date: 02/28/2021   End of Session - 02/28/21 1223    Visit Number 3    Number of Visits 5    Date for SLP Re-Evaluation 03/03/21    Authorization Type Medicaid    Authorization Time Period will request authorization 03/04/21-03/18/21    Authorization - Visit Number 2    Authorization - Number of Visits 4    SLP Start Time 1115    SLP Stop Time  1200    SLP Time Calculation (min) 45 min    Activity Tolerance Patient tolerated treatment well           Past Medical History:  Diagnosis Date  . Ambulates with cane    straight - uses occasionally  . Anxiety    due to the stroke  . Arthritis   . Back pain    hx of buldging disc  . Complication of anesthesia    took a while for him to wake up after previous anesthesia  . Diabetes mellitus without complication ([place])   . Family history of adverse reaction to anesthesia    "sometimes mom has a hard time waking up"  . High cholesterol    takes Zocor daily  . Hypertension    takes Benazepril and HCTZ  daily  . Joint pain   . Joint swelling   . Memory impairment    occassional - from stroke  . Myocardial infarction ([place]) 1987  . Pneumonia    hx of-80's  . Shortness of breath dyspnea    do to pain  . Sleep apnea    never had a sleep study,but states Dr. Cindie Laroche says he has it  . Slurred speech   . Stroke ([place]) 08/2013   7 mini-strokes, last stroke 2017  . TIA (transient ischemic attack) 2014   x 7   . Urinary frequency     Past Surgical History:  Procedure Laterality Date  . ABDOMINAL EXPOSURE N/A 06/16/2018   Procedure: ABDOMINAL EXPOSURE;  Surgeon: Rosetta Posner, MD;  Location: [facility];  Service: Vascular;   Laterality: N/A;  . ANKLE SURGERY  2008   left ankle-otif-[facility]  . ANTERIOR LUMBAR FUSION Bilateral 06/16/2018   Procedure: LUMBAR 4-5 LUMBAR 5-SACRUM 1 ANTERIOR LUMBAR INTERBODY FUSION WITH INSTRUMENTATION AND ALLOGRAFT;  Surgeon: Phylliss Bob, MD;  Location: [facility];  Service: Orthopedics;  Laterality: Bilateral;  . BACK SURGERY    . HEMATOMA EVACUATION Left 08/13/2018   Procedure: EVACUATION HEMATOMA LEFT ABDOMINAL WALL;  Surgeon: Rosetta Posner, MD;  [facility];  Service: Vascular;  Laterality: Left;  . IR RADIOLOGIST EVAL & MGMT  07/27/2018  . IR US GUIDE BX ASP/DRAIN  07/14/2018  . JOINT REPLACEMENT     both hips replaced   . LUMBAR LAMINECTOMY/DECOMPRESSION MICRODISCECTOMY Right 10/17/2014   Procedure: LUMBAR LAMINECTOMY/DECOMPRESSION MICRODISCECTOMY 2 LEVELS;  Surgeon: [name], MD;  Location: [facility];  Service: Neurosurgery;  Laterality: Right;  Right L45 L5S1 laminectomy and foraminotomy  . MASS EXCISION  09/13/2012   Procedure: EXCISION MASS;  Surgeon: Jamesetta So, MD;  [facility];  Service: General;  Laterality: N/A;  . ROBOTIC ASSITED PARTIAL NEPHRECTOMY Left 03/30/2019   Procedure: XI ROBOTIC ASSITED  PARTIAL NEPHRECTOMY POSSIBLE RADICAL NEPHRECTOMY;  Surgeon: Ceasar Mons, MD;  [facility];  Service: Urology;  Laterality: Left;  . SHOULDER ARTHROSCOPY WITH ROTATOR CUFF REPAIR Right 04/12/2020   Procedure: right shoulder arthroscopy, debridement, mini open rotator cuff tear repair;  Surgeon: Meredith Pel, MD;  Location: [facility];  Service: Orthopedics;  Laterality: Right;  . TONSILLECTOMY    . TOTAL HIP ARTHROPLASTY Right 03/20/2015  . TOTAL HIP ARTHROPLASTY Right 03/20/2015   Procedure: TOTAL HIP ARTHROPLASTY ANTERIOR APPROACH;  Surgeon: Renette Butters, MD;  Location: [facility];  Service: Orthopedics;  Laterality: Right;  . TOTAL HIP ARTHROPLASTY Left 01/08/2016   Procedure: TOTAL HIP ARTHROPLASTY ANTERIOR APPROACH;  Surgeon: Renette Butters, MD;  Location: [facility];  Service: Orthopedics;  Laterality: Left;    There were no vitals filed for this visit.   Subjective Assessment - 02/28/21 1218    Subjective "It has been stressful."    Currently in Pain? No/denies              ADULT SLP TREATMENT - 02/28/21 1219      General Information   Behavior/Cognition Cooperative;Pleasant mood;Alert    Patient Positioning Upright in chair    Oral care provided N/A    HPI Raymond Barrera. is a 60 y.o. male with medical history significant for TIAs with prior CVA, type 2 diabetes, CKD 3B, dyslipidemia, hypertension, prior MI, sleep apnea, and renal cell carcinoma status post prior nephrectomy who presented to the ED at the urging of his wife for slurred speech that began approximately 7 days prior. He seems to think that his symptoms began approximately around 10/29 and he has been stubborn about presenting to the ED for further evaluation despite knowing that these could be stroke like symptoms. He has also had some difficulty walking with his cane at baseline and he has had some weakness in both of his hands prohibiting some fine motor function. He does not have any new symptoms over the last 24 h, but his symptoms have persisted over the last several days. His speech is normally clear otherwise. He denies any headaches, but does have some intermittent blurry vision. He denies any nausea or vomiting, chest pain, shortness of breath, fevers, or chills. He is noted to have some residual weakness greater on the right side from a CVA on 01/2019. He is also noted to have blood sugar and blood pressure elevations at home, all despite taking his usual home medications. 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 He was discharged home on 10/09/20. He was referred for SLE by Dr. Merlene Laughter with a diagnosis  of dysarthria.      Treatment Provided   Treatment provided Cognitive-Linquistic      Pain Assessment   Pain Assessment No/denies pain      Cognitive-Linquistic Treatment   Treatment focused on Aphasia;Dysarthria;Patient/family/caregiver education    Skilled Treatment SLP provided cues for breath support to reduce glottal fry, sustained phonation, glides, vocal function, speech intelligibility (pauses, over articulation, pacing), and word finding strategies. Pt also using straw breathing technique.      Assessment / Recommendations / Plan   Plan Continue with current plan of care      Progression Toward Goals   Progression toward goals Progressing toward goals  SLP Short Term Goals - 02/28/21 1216      SLP SHORT TERM GOAL #1   Title Pt will increase conversation level speech intelligibility to 80% by utilizing appropriate strategies and listener needing repetition no more than 3x in a given conversation.    Baseline 75% intelligibility    Time 4    Period Weeks    Status On-going    Target Date 03/03/21      SLP SHORT TERM GOAL #2   Title Pt will implement word-finding strategies with 90% accuracy when unable to verbalize desired word in conversation/functional tasks with mi/mod assist.    Baseline 75%    Time 4    Period Weeks    Status On-going    Target Date 03/03/21      SLP SHORT TERM GOAL #3   Title Pt will complete moderate level verbal expression tasks (object description, state function, provide short summary, etc) using short sentences with 90% acc and min assist.    Baseline 75%    Time 4    Period Weeks    Status On-going    Target Date 03/03/21      SLP SHORT TERM GOAL #4   Title Pt will utilize speech intelligibility strategies (over articulation, reduced speaking rate, and vocal intensity) at the word/sentence/phrase level with 80% acc and min cues.    Baseline 75% intelligible given sentences to repeat    Time 4    Period Weeks     Status On-going    Target Date 03/07/21            SLP Long Term Goals - 02/28/21 1226      SLP LONG TERM GOAL #1   Title same as short term goals            Plan - 02/28/21 1225    Clinical Impression Statement Pt initially flustered and needed several minutes to sit and breathe before targeting speech goals. He independently used the straw breathing technique that was previously taught in the last session. He was unable to restate the speech intelligibility strategies (slow down, speak loud, open mouth, think first) despite picture cues. He required mod cues to implement speech intelligibility strategies initially, but faded to min cues. He was able to come up with movies he enjoys and specific actors. Unfortunately, he was only able to use 2 of 4 authorized visits within the approved time frame due to transportation difficulties. Will request 2 additional visits.   Speech Therapy Frequency 1x /week    Duration 2 weeks    Treatment/Interventions Compensatory strategies;Patient/family education;Cueing hierarchy;Multimodal communcation approach;Compensatory techniques;SLP instruction and feedback;Internal/external aids    Potential to Achieve Goals Fair    Potential Considerations Ability to learn/carryover information    SLP Home Exercise Plan Pt will complete HEP as assigned to facilitate carryover of treatment strategies and techniques in the home environment    Consulted and Agree with Plan of Care Patient           Patient will benefit from skilled therapeutic intervention in order to improve the following deficits and impairments:   Dysarthria and anarthria  Cognitive communication deficit    Problem List Patient Active Problem List   Diagnosis Date Noted  . CVA (cerebral vascular accident) ([place]) 10/07/2020  . Syncope 04/18/2019  . Chronic systolic heart failure ([place])   . Pain of both hip joints   . History of renal cell carcinoma   . Renal mass 03/30/2019  .  Abdominal  hematoma 08/13/2018  . Radiculopathy 06/16/2018  . Acute CVA (cerebrovascular accident) ([place]) 02/01/2017  . Diabetes mellitus ([place]) 02/01/2017  . HTN (hypertension) 02/01/2017  . Primary osteoarthritis of left hip 01/08/2016  . DJD (degenerative joint disease) 03/20/2015  . Primary osteoarthritis of right hip 02/26/2015  . Lumbar spondylosis 10/17/2014  . Lumbago 07/25/2014  . Abnormality of gait 07/25/2014  . Difficulty in walking(719.7) 07/25/2014  . TIA (transient ischemic attack) 03/09/2013  . Cocaine abuse ([place]) 03/09/2013  . Accelerated hypertension 03/09/2013  . Hyperlipidemia 03/09/2013  . Current smoker 03/09/2013   Thank you,  Genene Churn, [place]  [facility] 02/28/2021, 12:26 PM  [facility] [address] [place], [place], [address] Phone: [phone]   Fax:  [phone]   Name: Jayvian Escoe. MRN: [id number] Date of Birth: 10/20/1961

## 2021-03-04 ENCOUNTER — Ambulatory Visit (HOSPITAL_COMMUNITY): Payer: Medicaid Other | Attending: Orthopedic Surgery | Admitting: Speech Pathology

## 2021-03-04 ENCOUNTER — Encounter (HOSPITAL_COMMUNITY): Payer: Medicaid Other | Admitting: Physical Therapy

## 2021-03-04 ENCOUNTER — Encounter (HOSPITAL_COMMUNITY): Payer: Self-pay | Admitting: Speech Pathology

## 2021-03-04 ENCOUNTER — Other Ambulatory Visit: Payer: Self-pay

## 2021-03-04 DIAGNOSIS — R41841 Cognitive communication deficit: Secondary | ICD-10-CM

## 2021-03-04 DIAGNOSIS — R471 Dysarthria and anarthria: Secondary | ICD-10-CM | POA: Diagnosis not present

## 2021-03-04 DIAGNOSIS — M6281 Muscle weakness (generalized): Secondary | ICD-10-CM | POA: Diagnosis present

## 2021-03-04 DIAGNOSIS — R2689 Other abnormalities of gait and mobility: Secondary | ICD-10-CM | POA: Insufficient documentation

## 2021-03-04 NOTE — Therapy (Signed)
Raymond Barrera, [place], [address] Phone: [phone]   Fax:  [phone]  Speech Language Pathology Treatment  Patient Details  Name: Raymond Barrera. MRN: [id number] Date of Birth: [birth year] Referring Provider (SLP): Dickey Gave, MD   Encounter Date: 03/04/2021   End of Session - 03/04/21 1031    Visit Number 4    Number of Visits 5    Date for SLP Re-Evaluation 03/17/21    Authorization Type Medicaid    Authorization Time Period authorization 03/04/21-03/17/21    Authorization - Visit Number 3    Authorization - Number of Visits 4    SLP Start Time 1761    SLP Stop Time  1000    SLP Time Calculation (min) 46 min    Activity Tolerance Patient tolerated treatment well           Past Medical History:  Diagnosis Date  . Ambulates with cane    straight - uses occasionally  . Anxiety    due to the stroke  . Arthritis   . Back pain    hx of buldging disc  . Complication of anesthesia    took a while for him to wake up after previous anesthesia  . Diabetes mellitus without complication ([place])   . Family history of adverse reaction to anesthesia    "sometimes mom has a hard time waking up"  . High cholesterol    takes Zocor daily  . Hypertension    takes Benazepril and HCTZ  daily  . Joint pain   . Joint swelling   . Memory impairment    occassional - from stroke  . Myocardial infarction ([place]) 1987  . Pneumonia    hx of-80's  . Shortness of breath dyspnea    do to pain  . Sleep apnea    never had a sleep study,but states Dr. Cindie Laroche says he has it  . Slurred speech   . Stroke ([place]) 08/2013   7 mini-strokes, last stroke 2017  . TIA (transient ischemic attack) 2014   x 7   . Urinary frequency     Past Surgical History:  Procedure Laterality Date  . ABDOMINAL EXPOSURE N/A 06/16/2018   Procedure: ABDOMINAL EXPOSURE;  Surgeon: Rosetta Posner, MD;  Location: [facility];  Service: Vascular;  Laterality:  N/A;  . ANKLE SURGERY  2008   left ankle-otif-[facility]  . ANTERIOR LUMBAR FUSION Bilateral 06/16/2018   Procedure: LUMBAR 4-5 LUMBAR 5-SACRUM 1 ANTERIOR LUMBAR INTERBODY FUSION WITH INSTRUMENTATION AND ALLOGRAFT;  Surgeon: Phylliss Bob, MD;  Location: [facility];  Service: Orthopedics;  Laterality: Bilateral;  . BACK SURGERY    . HEMATOMA EVACUATION Left 08/13/2018   Procedure: EVACUATION HEMATOMA LEFT ABDOMINAL WALL;  Surgeon: Rosetta Posner, MD;  [facility];  Service: Vascular;  Laterality: Left;  . IR RADIOLOGIST EVAL & MGMT  07/27/2018  . IR US GUIDE BX ASP/DRAIN  07/14/2018  . JOINT REPLACEMENT     both hips replaced   . LUMBAR LAMINECTOMY/DECOMPRESSION MICRODISCECTOMY Right 10/17/2014   Procedure: LUMBAR LAMINECTOMY/DECOMPRESSION MICRODISCECTOMY 2 LEVELS;  Surgeon: [name], MD;  Location: [facility];  Service: Neurosurgery;  Laterality: Right;  Right L45 L5S1 laminectomy and foraminotomy  . MASS EXCISION  09/13/2012   Procedure: EXCISION MASS;  Surgeon: Jamesetta So, MD;  [facility];  Service: General;  Laterality: N/A;  . ROBOTIC ASSITED PARTIAL NEPHRECTOMY Left 03/30/2019   Procedure: XI ROBOTIC ASSITED PARTIAL NEPHRECTOMY  POSSIBLE RADICAL NEPHRECTOMY;  Surgeon: Ceasar Mons, MD;  [facility];  Service: Urology;  Laterality: Left;  . SHOULDER ARTHROSCOPY WITH ROTATOR CUFF REPAIR Right 04/12/2020   Procedure: right shoulder arthroscopy, debridement, mini open rotator cuff tear repair;  Surgeon: Meredith Pel, MD;  Location: [facility];  Service: Orthopedics;  Laterality: Right;  . TONSILLECTOMY    . TOTAL HIP ARTHROPLASTY Right 03/20/2015  . TOTAL HIP ARTHROPLASTY Right 03/20/2015   Procedure: TOTAL HIP ARTHROPLASTY ANTERIOR APPROACH;  Surgeon: Renette Butters, MD;  Location: [facility];  Service: Orthopedics;  Laterality: Right;  . TOTAL HIP ARTHROPLASTY Left 01/08/2016   Procedure: TOTAL HIP ARTHROPLASTY ANTERIOR APPROACH;  Surgeon: Renette Butters, MD;   Location: [facility];  Service: Orthopedics;  Laterality: Left;    There were no vitals filed for this visit.     ADULT SLP TREATMENT - 03/04/21 0940      General Information   Behavior/Cognition Cooperative;Pleasant mood;Alert    Patient Positioning Upright in chair    Oral care provided N/A    HPI Raymond Barrera. is a 60 y.o. male with medical history significant for TIAs with prior CVA, type 2 diabetes, CKD 3B, dyslipidemia, hypertension, prior MI, sleep apnea, and renal cell carcinoma status post prior nephrectomy who presented to the ED at the urging of his wife for slurred speech that began approximately 7 days prior. He seems to think that his symptoms began approximately around 10/29 and he has been stubborn about presenting to the ED for further evaluation despite knowing that these could be stroke like symptoms. He has also had some difficulty walking with his cane at baseline and he has had some weakness in both of his hands prohibiting some fine motor function. He does not have any new symptoms over the last 24 h, but his symptoms have persisted over the last several days. His speech is normally clear otherwise. He denies any headaches, but does have some intermittent blurry vision. He denies any nausea or vomiting, chest pain, shortness of breath, fevers, or chills. He is noted to have some residual weakness greater on the right side from a CVA on 01/2019. He is also noted to have blood sugar and blood pressure elevations at home, all despite taking his usual home medications. 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 He was discharged home on 10/09/20. He was referred for SLE by Dr. Merlene Laughter with a diagnosis of dysarthria.      Treatment Provided   Treatment provided Cognitive-Linquistic      Pain Assessment   Pain Assessment No/denies pain       Cognitive-Linquistic Treatment   Treatment focused on Aphasia;Dysarthria;Patient/family/caregiver education    Skilled Treatment SLP provided cues for breath support to reduce glottal fry, sustained phonation, glides, vocal function, speech intelligibility (pauses, over articulation, pacing), and word finding strategies. Pt also using straw breathing technique.      Assessment / Recommendations / Plan   Plan Continue with current plan of care      Progression Toward Goals   Progression toward goals Progressing toward goals            SLP Education - 03/04/21 0954    Education Details practice breathing techniques at home in reclined position    Person(s) Educated Patient    Methods Explanation    Comprehension Verbalized understanding  SLP Short Term Goals - 03/04/21 1033      SLP SHORT TERM GOAL #1   Title Pt will increase conversation level speech intelligibility to 80% by utilizing appropriate strategies and listener needing repetition no more than 3x in a given conversation.    Baseline 75% intelligibility    Time 4    Period Weeks    Status On-going    Target Date 03/17/21      SLP SHORT TERM GOAL #2   Title Pt will implement word-finding strategies with 90% accuracy when unable to verbalize desired word in conversation/functional tasks with mi/mod assist.    Baseline 75%    Time 4    Period Weeks    Status On-going    Target Date 03/17/21      SLP SHORT TERM GOAL #3   Title Pt will complete moderate level verbal expression tasks (object description, state function, provide short summary, etc) using short sentences with 90% acc and min assist.    Baseline 75%    Time 4    Period Weeks    Status On-going    Target Date 03/17/21      SLP SHORT TERM GOAL #4   Title Pt will utilize speech intelligibility strategies (over articulation, reduced speaking rate, and vocal intensity) at the word/sentence/phrase level with 80% acc and min cues.     Baseline 75% intelligible given sentences to repeat    Time 4    Period Weeks    Status On-going    Target Date 03/17/21            SLP Long Term Goals - 03/04/21 1034      SLP LONG TERM GOAL #1   Title same as short term goals            Plan - 03/04/21 1031    Clinical Impression Statement Pt reported trouble implementing speech strategies and breathing techniques at home over the weekend. He continues to exhibit glottal fry, which seems more pronounced in structured tasks (ie. Worse with effort?). SLP facilitated breathing exercises with Pt in reclined position with focus on inhalation through his nose for 2-3 and exhalation for 4-6 and then added voicing. Pt also instructed on straw phonation while exhaling through straw into water. Pt was able to sustain slow exhalation, however had difficulty adding voicing. Vocal quality continues to present with harsh, glottal fry at times. Speech intelligibility in conversation was ~88% acc with occasional requests for clarification. Pt reminded to complete counting/breathing exercises for homework. One more session prior to d/c.    Speech Therapy Frequency 1x /week    Duration 2 weeks    Treatment/Interventions Compensatory strategies;Patient/family education;Cueing hierarchy;Multimodal communcation approach;Compensatory techniques;SLP instruction and feedback;Internal/external aids    Potential to Achieve Goals Fair    Potential Considerations Ability to learn/carryover information    SLP Home Exercise Plan Pt will complete HEP as assigned to facilitate carryover of treatment strategies and techniques in the home environment    Consulted and Agree with Plan of Care Patient           Patient will benefit from skilled therapeutic intervention in order to improve the following deficits and impairments:   Dysarthria and anarthria  Cognitive communication deficit    Problem List Patient Active Problem List   Diagnosis Date Noted  .  CVA (cerebral vascular accident) ([place]) 10/07/2020  . Syncope 04/18/2019  . Chronic systolic heart failure ([place])   . Pain of both hip joints   .  History of renal cell carcinoma   . Renal mass 03/30/2019  . Abdominal hematoma 08/13/2018  . Radiculopathy 06/16/2018  . Acute CVA (cerebrovascular accident) ([place]) 02/01/2017  . Diabetes mellitus ([place]) 02/01/2017  . HTN (hypertension) 02/01/2017  . Primary osteoarthritis of left hip 01/08/2016  . DJD (degenerative joint disease) 03/20/2015  . Primary osteoarthritis of right hip 02/26/2015  . Lumbar spondylosis 10/17/2014  . Lumbago 07/25/2014  . Abnormality of gait 07/25/2014  . Difficulty in walking(719.7) 07/25/2014  . TIA (transient ischemic attack) 03/09/2013  . Cocaine abuse ([place]) 03/09/2013  . Accelerated hypertension 03/09/2013  . Hyperlipidemia 03/09/2013  . Current smoker 03/09/2013   Thank you,  Genene Churn, [place]  [facility] 03/04/2021, 10:35 AM  [place], [place], [address] Phone: [phone]   Fax:  [phone]   Name: Geramy Lamorte. MRN: [id number] Date of Birth: 1961/03/30

## 2021-03-07 ENCOUNTER — Ambulatory Visit (HOSPITAL_COMMUNITY): Payer: Medicaid Other

## 2021-03-07 ENCOUNTER — Encounter (HOSPITAL_COMMUNITY): Payer: Self-pay

## 2021-03-07 ENCOUNTER — Other Ambulatory Visit: Payer: Self-pay

## 2021-03-07 DIAGNOSIS — R2689 Other abnormalities of gait and mobility: Secondary | ICD-10-CM

## 2021-03-07 DIAGNOSIS — M6281 Muscle weakness (generalized): Secondary | ICD-10-CM

## 2021-03-07 DIAGNOSIS — R471 Dysarthria and anarthria: Secondary | ICD-10-CM | POA: Diagnosis not present

## 2021-03-07 NOTE — Patient Instructions (Addendum)
Toe / Heel Raise (Standing)    Standing with support, raise heels, then rock back on heels and raise toes. Repeat _20_ times.  Copyright  VHI. All rights reserved.   HIP: Abduction - Standing    Squeeze glutes. Raise leg out and slightly back. 10-20_ reps per set, _1_ sets per day, _5__ days per week. Hold onto a support and balance.  Hip Extension (Standing)    Stand with support. Squeeze pelvic floor and hold. Move right leg backward with straight knee.Repeat ___ times. Do ___ times a day. Repeat with other leg.    Copyright  VHI. All rights reserved.   Tandem Standing    Stand heel to toe: 1.Look right ___ times. 2. Look left ___ times. 3. Look up ___ times. 4. With right arm, reach right, left, forward and back. Repeat ____ times. Do ____ sessions per day.  http://[email or web address].exer.us/541   Copyright  VHI. All rights reserved.   Tandem Stance    Right foot in front of left, heel touching toe both feet "straight ahead". Stand on Foot Triangle of Support with both feet. Balance in this position 30_ seconds. Do with left foot in front of right. To make it more difficult, stand on a folded bath towel or pillow.  Copyright  VHI. All rights reserved.

## 2021-03-07 NOTE — Therapy (Signed)
[place], [place], [address] Phone: [phone]   Fax:  [phone]  Physical Therapy Treatment  Patient Details  Name: Raymond Barrera. MRN: [id number] Date of Birth: [birth year] Referring Provider (PT): Phillips Odor MD   Encounter Date: 03/07/2021   PT End of Session - 03/07/21 0926    Visit Number 3    Number of Visits 5    Date for PT Re-Evaluation 03/22/21    Authorization Type Medicaid [place]    Authorization Time Period Check auth    PT Start Time [phone]    PT Stop Time 1010    PT Time Calculation (min) 42 min    Equipment Utilized During Treatment Gait belt    Activity Tolerance Patient tolerated treatment well    Behavior During Therapy WFL for tasks assessed/performed           Past Medical History:  Diagnosis Date  . Ambulates with cane    straight - uses occasionally  . Anxiety    due to the stroke  . Arthritis   . Back pain    hx of buldging disc  . Complication of anesthesia    took a while for him to wake up after previous anesthesia  . Diabetes mellitus without complication ([place])   . Family history of adverse reaction to anesthesia    "sometimes mom has a hard time waking up"  . High cholesterol    takes Zocor daily  . Hypertension    takes Benazepril and HCTZ  daily  . Joint pain   . Joint swelling   . Memory impairment    occassional - from stroke  . Myocardial infarction ([place]) 1987  . Pneumonia    hx of-80's  . Shortness of breath dyspnea    do to pain  . Sleep apnea    never had a sleep study,but states Dr. Cindie Laroche says he has it  . Slurred speech   . Stroke ([place]) 08/2013   7 mini-strokes, last stroke 2017  . TIA (transient ischemic attack) 2014   x 7   . Urinary frequency     Past Surgical History:  Procedure Laterality Date  . ABDOMINAL EXPOSURE N/A 06/16/2018   Procedure: ABDOMINAL EXPOSURE;  Surgeon: Rosetta Posner, MD;  Location: [facility];  Service: Vascular;   Laterality: N/A;  . ANKLE SURGERY  2008   left ankle-otif-[facility]  . ANTERIOR LUMBAR FUSION Bilateral 06/16/2018   Procedure: LUMBAR 4-5 LUMBAR 5-SACRUM 1 ANTERIOR LUMBAR INTERBODY FUSION WITH INSTRUMENTATION AND ALLOGRAFT;  Surgeon: Phylliss Bob, MD;  Location: [facility];  Service: Orthopedics;  Laterality: Bilateral;  . BACK SURGERY    . HEMATOMA EVACUATION Left 08/13/2018   Procedure: EVACUATION HEMATOMA LEFT ABDOMINAL WALL;  Surgeon: Rosetta Posner, MD;  [facility];  Service: Vascular;  Laterality: Left;  . IR RADIOLOGIST EVAL & MGMT  07/27/2018  . IR US GUIDE BX ASP/DRAIN  07/14/2018  . JOINT REPLACEMENT     both hips replaced   . LUMBAR LAMINECTOMY/DECOMPRESSION MICRODISCECTOMY Right 10/17/2014   Procedure: LUMBAR LAMINECTOMY/DECOMPRESSION MICRODISCECTOMY 2 LEVELS;  Surgeon: [name], MD;  Location: [facility];  Service: Neurosurgery;  Laterality: Right;  Right L45 L5S1 laminectomy and foraminotomy  . MASS EXCISION  09/13/2012   Procedure: EXCISION MASS;  Surgeon: Jamesetta So, MD;  [facility];  Service: General;  Laterality: N/A;  . ROBOTIC ASSITED PARTIAL NEPHRECTOMY Left 03/30/2019   Procedure: XI ROBOTIC ASSITED  PARTIAL NEPHRECTOMY POSSIBLE RADICAL NEPHRECTOMY;  Surgeon: Ceasar Mons, MD;  [facility];  Service: Urology;  Laterality: Left;  . SHOULDER ARTHROSCOPY WITH ROTATOR CUFF REPAIR Right 04/12/2020   Procedure: right shoulder arthroscopy, debridement, mini open rotator cuff tear repair;  Surgeon: Meredith Pel, MD;  Location: [facility];  Service: Orthopedics;  Laterality: Right;  . TONSILLECTOMY    . TOTAL HIP ARTHROPLASTY Right 03/20/2015  . TOTAL HIP ARTHROPLASTY Right 03/20/2015   Procedure: TOTAL HIP ARTHROPLASTY ANTERIOR APPROACH;  Surgeon: Renette Butters, MD;  Location: [facility];  Service: Orthopedics;  Laterality: Right;  . TOTAL HIP ARTHROPLASTY Left 01/08/2016   Procedure: TOTAL HIP ARTHROPLASTY ANTERIOR APPROACH;  Surgeon: Renette Butters, MD;  Location: [facility];  Service: Orthopedics;  Laterality: Left;    There were no vitals filed for this visit.   Subjective Assessment - 03/07/21 0926    Subjective No pain to speak of. Feeling fairly positive today and ready to work during PT.    Pertinent History CVA x 2 (10/31 and 10/07/21), HTN, DM, has 1 kidney, Cx history    Limitations Lifting;Standing;Walking;House hold activities    How long can you stand comfortably? "about 20-30 minutes"    Patient Stated Goals "Get better"    Currently in Pain? No/denies    Pain Onset More than a month ago            [facility] Adult PT Treatment/Exercise - 03/07/21 0001      Exercises   Exercises Knee/Hip      Knee/Hip Exercises: Standing   Heel Raises Both;1 set;20 reps    Heel Raises Limitations + toe raises; both on incline    Hip Abduction Stengthening;Both;1 set;15 reps    Abduction Limitations Right hip cramp after 6 reps    Hip Extension Stengthening;Both;1 set    SLS 30 sec x2 each; max 7 sec on Left and Right    SLS with Vectors 3 sec x5 RT bilateral    Other Standing Knee Exercises toe taps onto 6" step x10 each    Other Standing Knee Exercises tandem balance solid surface, + head turns, and on foam head straight ahead 2x30 sec bouts each            PT Education - 03/07/21 1010    Education Details Discussed purpose and technique of intervention throughout session. Advanced HEP.    Person(s) Educated Patient    Methods Explanation;Demonstration;Handout    Comprehension Verbalized understanding;Need further instruction            PT Short Term Goals - 02/28/21 1108      PT SHORT TERM GOAL #1   Title Patient will be independent with initial HEP and self-management strategies to improve functional outcomes    Time 2    Period Weeks    Status On-going    Target Date 02/13/21             PT Long Term Goals - 02/28/21 1109      PT LONG TERM GOAL #1   Title Patient will be able to perform stand x 5 in <  15 seconds to demonstrate improvement in functional mobility and reduced risk for falls.    Baseline Current 18 sec with no UE    Time 4    Period Weeks    Status On-going      PT LONG TERM GOAL #2   Title Patient will be able to stand at least 10-15 minutes  to show improved ability to perform cooking and grooming ADLs.    Baseline Reports 20-30 minutes    Time 4    Period Weeks    Status Achieved      PT LONG TERM GOAL #3   Title Patient will be able to maintain tandem stance >30 seconds on BLEs to improve stability and reduce risk for falls    Baseline See balance    Time 4    Period Weeks    Status On-going      PT LONG TERM GOAL #4   Title Patient will have equal to or > 4/5 MMT throughout BLEs to improve ability to perform functional mobility, stair ambulation and ADLs.    Baseline See MMT    Time 4    Period Weeks    Status Achieved             Plan - 03/07/21 4098    Clinical Impression Statement Session focused on strengthening and balance exercises and advancing home exercise program. Advanced therapeutic exercises to include heel/toe raises on incline, standing hip abduction and extension, tandem stance balance on solid surface adding head turns; also tandem stance balance on foam/compliant surface, SLS and SLS with vectors, and toe taps/marching on 6 inch step. Added all these exercises to HEP, except, SLS, SLS with vectors, and toe taps. Patient verbalized understanding. Patient will require review of some of HEP exercises to ensure follow through. Patient will require review of SLS with vectors prior to adding to HEP. Patient reported leg muscle fatigue at end of session and that he was pleased with his performance.    Personal Factors and Comorbidities Comorbidity 3+    Comorbidities CVA, DM, HTN    Examination-Activity Limitations Stairs;Stand;Locomotion Level;Lift;Transfers    Examination-Participation Restrictions Art gallery manager;Yard Work     Stability/Clinical Decision Making Stable/Uncomplicated    Rehab Potential Good    PT Frequency 1x / week    PT Duration 3 weeks    PT Treatment/Interventions ADLs/Self Care Home Management;Ultrasound;Parrafin;Neuromuscular re-education;Compression bandaging;Scar mobilization;Passive range of motion;Patient/family education;Visual/perceptual remediation/compensation;Fluidtherapy;Contrast Bath;Aquatic Therapy;Biofeedback;DME Instruction;Gait training;Canalith Repostioning;Stair training;Cryotherapy;Orthotic Fit/Training;Dry needling;Energy conservation;Splinting;Functional mobility training;Manual techniques;Therapeutic activities;Vasopneumatic Device;Taping;Electrical Stimulation;Iontophoresis 4mg /ml Dexamethasone;Moist Heat;Therapeutic exercise;Traction;Balance training;Manual lymph drainage;Vestibular;Spinal Manipulations;Joint Manipulations    PT Next Visit Plan Progress  functional strength, gait and balance. Add step ups, sidestepping, Paloff press.    PT Home Exercise Plan Eval: heel/toe raise, sit to stand, tandem stance with support; 03/07/21 - heel/toe raise, stand hip abd/ext, tandem solid surface + head turns, tandem on compliant surface    Consulted and Agree with Plan of Care Patient           Patient will benefit from skilled therapeutic intervention in order to improve the following deficits and impairments:  Abnormal gait,Decreased balance,Difficulty walking,Decreased activity tolerance,Decreased endurance,Decreased strength  Visit Diagnosis: Other abnormalities of gait and mobility  Muscle weakness (generalized)     Problem List Patient Active Problem List   Diagnosis Date Noted  . CVA (cerebral vascular accident) ([place]) 10/07/2020  . Syncope 04/18/2019  . Chronic systolic heart failure ([place])   . Pain of both hip joints   . History of renal cell carcinoma   . Renal mass 03/30/2019  . Abdominal hematoma 08/13/2018  . Radiculopathy 06/16/2018  . Acute CVA  (cerebrovascular accident) ([place]) 02/01/2017  . Diabetes mellitus ([place]) 02/01/2017  . HTN (hypertension) 02/01/2017  . Primary osteoarthritis of left hip 01/08/2016  . DJD (degenerative joint disease) 03/20/2015  . Primary osteoarthritis of right hip 02/26/2015  .  Lumbar spondylosis 10/17/2014  . Lumbago 07/25/2014  . Abnormality of gait 07/25/2014  . Difficulty in walking(719.7) 07/25/2014  . TIA (transient ischemic attack) 03/09/2013  . Cocaine abuse ([place]) 03/09/2013  . Accelerated hypertension 03/09/2013  . Hyperlipidemia 03/09/2013  . Current smoker 03/09/2013   Floria Raveling. Hartnett-Rands, MS, PT Per [place] [phone]  Jeannie Done 03/07/2021, 10:21 AM  [facility] [address] [place], [place], [address] Phone: [phone]   Fax:  [phone]  Name: Raymond Barrera. MRN: [id number] Date of Birth: December 30, 1960

## 2021-03-12 ENCOUNTER — Ambulatory Visit (HOSPITAL_COMMUNITY): Payer: Medicaid Other | Admitting: Physical Therapy

## 2021-03-12 ENCOUNTER — Other Ambulatory Visit: Payer: Self-pay

## 2021-03-12 ENCOUNTER — Encounter (HOSPITAL_COMMUNITY): Payer: Self-pay | Admitting: Physical Therapy

## 2021-03-12 DIAGNOSIS — R2689 Other abnormalities of gait and mobility: Secondary | ICD-10-CM

## 2021-03-12 DIAGNOSIS — M6281 Muscle weakness (generalized): Secondary | ICD-10-CM

## 2021-03-12 DIAGNOSIS — R471 Dysarthria and anarthria: Secondary | ICD-10-CM | POA: Diagnosis not present

## 2021-03-12 NOTE — Patient Instructions (Signed)
Access Code: [email or web address] URL: [email or web address] Date: 03/12/2021 Prepared by: Josue Hector  Exercises Side Stepping with Counter Support - 2 x daily - 7 x weekly - 1 sets - 10 reps Standing 3-Way Kick - 2 x daily - 7 x weekly - 3 sets - 5 reps - 5 second hold

## 2021-03-12 NOTE — Therapy (Signed)
[place], [place], [address] Phone: [phone]   Fax:  [phone]  Physical Therapy Treatment  Patient Details  Name: Raymond Barrera. MRN: [id number] Date of Birth: 06-03-1961 Referring Provider (PT): Phillips Odor MD   Encounter Date: 03/12/2021   PT End of Session - 03/12/21 0952    Visit Number 4    Number of Visits 5    Date for PT Re-Evaluation 03/22/21    Authorization Type Medicaid [place]    Authorization Time Period 4/7-4/27/22    Authorization - Visit Number 2    Authorization - Number of Visits 3    PT Start Time 0949    PT Stop Time 1029    PT Time Calculation (min) 40 min    Equipment Utilized During Treatment Gait belt    Activity Tolerance Patient tolerated treatment well;Patient limited by fatigue    Behavior During Therapy WFL for tasks assessed/performed           Past Medical History:  Diagnosis Date  . Ambulates with cane    straight - uses occasionally  . Anxiety    due to the stroke  . Arthritis   . Back pain    hx of buldging disc  . Complication of anesthesia    took a while for him to wake up after previous anesthesia  . Diabetes mellitus without complication ([place])   . Family history of adverse reaction to anesthesia    "sometimes mom has a hard time waking up"  . High cholesterol    takes Zocor daily  . Hypertension    takes Benazepril and HCTZ  daily  . Joint pain   . Joint swelling   . Memory impairment    occassional - from stroke  . Myocardial infarction ([place]) 1987  . Pneumonia    hx of-80's  . Shortness of breath dyspnea    do to pain  . Sleep apnea    never had a sleep study,but states Dr. Cindie Laroche says he has it  . Slurred speech   . Stroke ([place]) 08/2013   7 mini-strokes, last stroke 2017  . TIA (transient ischemic attack) 2014   x 7   . Urinary frequency     Past Surgical History:  Procedure Laterality Date  . ABDOMINAL EXPOSURE N/A  06/16/2018   Procedure: ABDOMINAL EXPOSURE;  Surgeon: Rosetta Posner, MD;  Location: [facility];  Service: Vascular;  Laterality: N/A;  . ANKLE SURGERY  2008   left ankle-otif-[facility]  . ANTERIOR LUMBAR FUSION Bilateral 06/16/2018   Procedure: LUMBAR 4-5 LUMBAR 5-SACRUM 1 ANTERIOR LUMBAR INTERBODY FUSION WITH INSTRUMENTATION AND ALLOGRAFT;  Surgeon: Phylliss Bob, MD;  Location: [facility];  Service: Orthopedics;  Laterality: Bilateral;  . BACK SURGERY    . HEMATOMA EVACUATION Left 08/13/2018   Procedure: EVACUATION HEMATOMA LEFT ABDOMINAL WALL;  Surgeon: Rosetta Posner, MD;  [facility];  Service: Vascular;  Laterality: Left;  . IR RADIOLOGIST EVAL & MGMT  07/27/2018  . IR US GUIDE BX ASP/DRAIN  07/14/2018  . JOINT REPLACEMENT     both hips replaced   . LUMBAR LAMINECTOMY/DECOMPRESSION MICRODISCECTOMY Right 10/17/2014   Procedure: LUMBAR LAMINECTOMY/DECOMPRESSION MICRODISCECTOMY 2 LEVELS;  Surgeon: [name], MD;  Location: [facility];  Service: Neurosurgery;  Laterality: Right;  Right L45 L5S1 laminectomy and foraminotomy  . MASS EXCISION  09/13/2012   Procedure: EXCISION MASS;  Surgeon: Jamesetta So, MD;  [facility];  Service: General;  Laterality: N/A;  . ROBOTIC ASSITED PARTIAL NEPHRECTOMY Left 03/30/2019   Procedure: XI ROBOTIC ASSITED PARTIAL NEPHRECTOMY POSSIBLE RADICAL NEPHRECTOMY;  Surgeon: Ceasar Mons, MD;  [facility];  Service: Urology;  Laterality: Left;  . SHOULDER ARTHROSCOPY WITH ROTATOR CUFF REPAIR Right 04/12/2020   Procedure: right shoulder arthroscopy, debridement, mini open rotator cuff tear repair;  Surgeon: Meredith Pel, MD;  Location: [facility];  Service: Orthopedics;  Laterality: Right;  . TONSILLECTOMY    . TOTAL HIP ARTHROPLASTY Right 03/20/2015  . TOTAL HIP ARTHROPLASTY Right 03/20/2015   Procedure: TOTAL HIP ARTHROPLASTY ANTERIOR APPROACH;  Surgeon: Renette Butters, MD;  Location: [facility];  Service: Orthopedics;  Laterality: Right;  . TOTAL  HIP ARTHROPLASTY Left 01/08/2016   Procedure: TOTAL HIP ARTHROPLASTY ANTERIOR APPROACH;  Surgeon: Renette Butters, MD;  Location: [facility];  Service: Orthopedics;  Laterality: Left;    There were no vitals filed for this visit.   Subjective Assessment - 03/12/21 0952    Subjective Patient reports no new issues. Exercises going well.    Pertinent History CVA x 2 (10/31 and 10/07/21), HTN, DM, has 1 kidney, Cx history    Limitations Lifting;Standing;Walking;House hold activities    How long can you stand comfortably? "about 20-30 minutes"    Patient Stated Goals "Get better"    Currently in Pain? No/denies    Pain Onset More than a month ago                             [facility] Adult PT Treatment/Exercise - 03/12/21 0001      Knee/Hip Exercises: Standing   Heel Raises Both;2 sets;10 reps    Heel Raises Limitations 2 x 10 toe raises    Hip Abduction Both;2 sets;10 reps    Forward Step Up Both;1 set;10 reps;Hand Hold: 0;Step Height: 6"    Functional Squat 1 set;10 reps    Stairs 7 in 2RT reciprocal (when cued) single hand rail    SLS with Vectors 2 RT with 5" holds each    Other Standing Knee Exercises toe taps onto 6" step x20 each, no HHA    Other Standing Knee Exercises tandem stance on foam 2 x 30", FWD hurdle amb 3RT no HHA, Lateral hurlde amb 3RT no HHA (6 inch)                    PT Short Term Goals - 02/28/21 1108      PT SHORT TERM GOAL #1   Title Patient will be independent with initial HEP and self-management strategies to improve functional outcomes    Time 2    Period Weeks    Status On-going    Target Date 02/13/21             PT Long Term Goals - 02/28/21 1109      PT LONG TERM GOAL #1   Title Patient will be able to perform stand x 5 in < 15 seconds to demonstrate improvement in functional mobility and reduced risk for falls.    Baseline Current 18 sec with no UE    Time 4    Period Weeks    Status On-going      PT LONG TERM GOAL  #2   Title Patient will be able to stand at least 10-15 minutes to show improved ability to perform cooking and grooming ADLs.    Baseline Reports 20-30  minutes    Time 4    Period Weeks    Status Achieved      PT LONG TERM GOAL #3   Title Patient will be able to maintain tandem stance >30 seconds on BLEs to improve stability and reduce risk for falls    Baseline See balance    Time 4    Period Weeks    Status On-going      PT LONG TERM GOAL #4   Title Patient will have equal to or > 4/5 MMT throughout BLEs to improve ability to perform functional mobility, stair ambulation and ADLs.    Baseline See MMT    Time 4    Period Weeks    Status Achieved                 Plan - 03/12/21 1025    Clinical Impression Statement Patient tolerated session well today. Patient doing well with gait and balance progressions. Also showing improved LE strength. Progressed dynamic balance activity today. Patient limited by mild fatigue throughout session, requiring several breaks for rest. Issued updated HEP handout. Patient will continue to benefit from skilled therapy session to reduce deficits and improve functional ability.    Personal Factors and Comorbidities Comorbidity 3+    Comorbidities CVA, DM, HTN    Examination-Activity Limitations Stairs;Stand;Locomotion Level;Lift;Transfers    Examination-Participation Restrictions Art gallery manager;Yard Work    Stability/Clinical Decision Making Stable/Uncomplicated    Rehab Potential Good    PT Frequency 1x / week    PT Duration 3 weeks    PT Treatment/Interventions ADLs/Self Care Home Management;Ultrasound;Parrafin;Neuromuscular re-education;Compression bandaging;Scar mobilization;Passive range of motion;Patient/family education;Visual/perceptual remediation/compensation;Fluidtherapy;Contrast Bath;Aquatic Therapy;Biofeedback;DME Instruction;Gait training;Canalith Repostioning;Stair training;Cryotherapy;Orthotic Fit/Training;Dry  needling;Energy conservation;Splinting;Functional mobility training;Manual techniques;Therapeutic activities;Vasopneumatic Device;Taping;Electrical Stimulation;Iontophoresis 4mg /ml Dexamethasone;Moist Heat;Therapeutic exercise;Traction;Balance training;Manual lymph drainage;Vestibular;Spinal Manipulations;Joint Manipulations    PT Next Visit Plan Progress  functional strength, gait and balance. Progress HEP for DC next visit    PT Home Exercise Plan Eval: heel/toe raise, sit to stand, tandem stance with support; 03/07/21 - heel/toe raise, stand hip abd/ext, tandem solid surface + head turns, tandem on compliant surface 4/12 sidestepping, SLS with vector support    Consulted and Agree with Plan of Care Patient           Patient will benefit from skilled therapeutic intervention in order to improve the following deficits and impairments:  Abnormal gait,Decreased balance,Difficulty walking,Decreased activity tolerance,Decreased endurance,Decreased strength  Visit Diagnosis: Other abnormalities of gait and mobility  Muscle weakness (generalized)     Problem List Patient Active Problem List   Diagnosis Date Noted  . CVA (cerebral vascular accident) ([place]) 10/07/2020  . Syncope 04/18/2019  . Chronic systolic heart failure ([place])   . Pain of both hip joints   . History of renal cell carcinoma   . Renal mass 03/30/2019  . Abdominal hematoma 08/13/2018  . Radiculopathy 06/16/2018  . Acute CVA (cerebrovascular accident) ([place]) 02/01/2017  . Diabetes mellitus ([place]) 02/01/2017  . HTN (hypertension) 02/01/2017  . Primary osteoarthritis of left hip 01/08/2016  . DJD (degenerative joint disease) 03/20/2015  . Primary osteoarthritis of right hip 02/26/2015  . Lumbar spondylosis 10/17/2014  . Lumbago 07/25/2014  . Abnormality of gait 07/25/2014  . Difficulty in walking(719.7) 07/25/2014  . TIA (transient ischemic attack) 03/09/2013  . Cocaine abuse ([place]) 03/09/2013  . Accelerated hypertension  03/09/2013  . Hyperlipidemia 03/09/2013  . Current smoker 03/09/2013   10:33 AM, 03/12/21 Josue Hector PT DPT  Physical Therapist with [facility]  [facility]  [phone]   [facility] [place], [place], [address] Phone: [phone]   Fax:  [phone]  Name: Raymond Barrera. MRN: [id number] Date of Birth: 03/31/1961

## 2021-03-13 ENCOUNTER — Encounter (HOSPITAL_COMMUNITY): Payer: Self-pay | Admitting: Speech Pathology

## 2021-03-13 ENCOUNTER — Ambulatory Visit (HOSPITAL_COMMUNITY): Payer: Medicaid Other | Admitting: Speech Pathology

## 2021-03-13 DIAGNOSIS — R471 Dysarthria and anarthria: Secondary | ICD-10-CM

## 2021-03-13 DIAGNOSIS — R41841 Cognitive communication deficit: Secondary | ICD-10-CM

## 2021-03-13 NOTE — Therapy (Signed)
Raymond Barrera, [place], [address] Phone: [phone]   Fax:  [phone]  Speech Language Pathology Treatment  Patient Details  Name: Raymond Barrera. MRN: [id number] Date of Birth: Apr 17, 1961 Referring Provider (SLP): Dickey Gave, MD   Encounter Date: 03/13/2021   End of Session - 03/13/21 0934    Visit Number 5    Number of Visits 5    Date for SLP Re-Evaluation 03/17/21    Authorization Type Medicaid    Authorization Time Period will request authorization 03/04/21-03/18/21    Authorization - Visit Number 4    Authorization - Number of Visits 4    SLP Start Time 0913    SLP Stop Time  1000    SLP Time Calculation (min) 47 min    Activity Tolerance Patient tolerated treatment well           Past Medical History:  Diagnosis Date  . Ambulates with cane    straight - uses occasionally  . Anxiety    due to the stroke  . Arthritis   . Back pain    hx of buldging disc  . Complication of anesthesia    took a while for him to wake up after previous anesthesia  . Diabetes mellitus without complication ([place])   . Family history of adverse reaction to anesthesia    "sometimes mom has a hard time waking up"  . High cholesterol    takes Zocor daily  . Hypertension    takes Benazepril and HCTZ  daily  . Joint pain   . Joint swelling   . Memory impairment    occassional - from stroke  . Myocardial infarction ([place]) 1987  . Pneumonia    hx of-80's  . Shortness of breath dyspnea    do to pain  . Sleep apnea    never had a sleep study,but states Dr. Cindie Laroche says he has it  . Slurred speech   . Stroke ([place]) 08/2013   7 mini-strokes, last stroke 2017  . TIA (transient ischemic attack) 2014   x 7   . Urinary frequency     Past Surgical History:  Procedure Laterality Date  . ABDOMINAL EXPOSURE N/A 06/16/2018   Procedure: ABDOMINAL EXPOSURE;  Surgeon: Rosetta Posner, MD;  Location: [facility];  Service: Vascular;   Laterality: N/A;  . ANKLE SURGERY  2008   left ankle-otif-[facility]  . ANTERIOR LUMBAR FUSION Bilateral 06/16/2018   Procedure: LUMBAR 4-5 LUMBAR 5-SACRUM 1 ANTERIOR LUMBAR INTERBODY FUSION WITH INSTRUMENTATION AND ALLOGRAFT;  Surgeon: Phylliss Bob, MD;  Location: [facility];  Service: Orthopedics;  Laterality: Bilateral;  . BACK SURGERY    . HEMATOMA EVACUATION Left 08/13/2018   Procedure: EVACUATION HEMATOMA LEFT ABDOMINAL WALL;  Surgeon: Rosetta Posner, MD;  [facility];  Service: Vascular;  Laterality: Left;  . IR RADIOLOGIST EVAL & MGMT  07/27/2018  . IR US GUIDE BX ASP/DRAIN  07/14/2018  . JOINT REPLACEMENT     both hips replaced   . LUMBAR LAMINECTOMY/DECOMPRESSION MICRODISCECTOMY Right 10/17/2014   Procedure: LUMBAR LAMINECTOMY/DECOMPRESSION MICRODISCECTOMY 2 LEVELS;  Surgeon: [name], MD;  Location: [facility];  Service: Neurosurgery;  Laterality: Right;  Right L45 L5S1 laminectomy and foraminotomy  . MASS EXCISION  09/13/2012   Procedure: EXCISION MASS;  Surgeon: Jamesetta So, MD;  [facility];  Service: General;  Laterality: N/A;  . ROBOTIC ASSITED PARTIAL NEPHRECTOMY Left 03/30/2019   Procedure: XI ROBOTIC ASSITED  PARTIAL NEPHRECTOMY POSSIBLE RADICAL NEPHRECTOMY;  Surgeon: Ceasar Mons, MD;  [facility];  Service: Urology;  Laterality: Left;  . SHOULDER ARTHROSCOPY WITH ROTATOR CUFF REPAIR Right 04/12/2020   Procedure: right shoulder arthroscopy, debridement, mini open rotator cuff tear repair;  Surgeon: Meredith Pel, MD;  Location: [facility];  Service: Orthopedics;  Laterality: Right;  . TONSILLECTOMY    . TOTAL HIP ARTHROPLASTY Right 03/20/2015  . TOTAL HIP ARTHROPLASTY Right 03/20/2015   Procedure: TOTAL HIP ARTHROPLASTY ANTERIOR APPROACH;  Surgeon: Renette Butters, MD;  Location: [facility];  Service: Orthopedics;  Laterality: Right;  . TOTAL HIP ARTHROPLASTY Left 01/08/2016   Procedure: TOTAL HIP ARTHROPLASTY ANTERIOR APPROACH;  Surgeon: Renette Butters, MD;  Location: [facility];  Service: Orthopedics;  Laterality: Left;    There were no vitals filed for this visit.   Subjective Assessment - 03/13/21 0924    Subjective "My throat was sore yesterday."    Currently in Pain? No/denies             ADULT SLP TREATMENT - 03/13/21 0001      General Information   Behavior/Cognition Cooperative;Pleasant mood;Alert    Patient Positioning Upright in chair    Oral care provided N/A    HPI Reco [name]. is a 60 y.o. male with medical history significant for TIAs with prior CVA, type 2 diabetes, CKD 3B, dyslipidemia, hypertension, prior MI, sleep apnea, and renal cell carcinoma status post prior nephrectomy who presented to the ED at the urging of his wife for slurred speech that began approximately 7 days prior. He seems to think that his symptoms began approximately around 10/29 and he has been stubborn about presenting to the ED for further evaluation despite knowing that these could be stroke like symptoms. He has also had some difficulty walking with his cane at baseline and he has had some weakness in both of his hands prohibiting some fine motor function. He does not have any new symptoms over the last 24 h, but his symptoms have persisted over the last several days. His speech is normally clear otherwise. He denies any headaches, but does have some intermittent blurry vision. He denies any nausea or vomiting, chest pain, shortness of breath, fevers, or chills. He is noted to have some residual weakness greater on the right side from a CVA on 01/2019. He is also noted to have blood sugar and blood pressure elevations at home, all despite taking his usual home medications. 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 He was discharged home on 10/09/20. He was referred for SLE by Dr. Merlene Laughter with a  diagnosis of dysarthria.      Treatment Provided   Treatment provided Cognitive-Linquistic      Pain Assessment   Pain Assessment No/denies pain      Cognitive-Linquistic Treatment   Treatment focused on Aphasia;Dysarthria;Patient/family/caregiver education    Skilled Treatment SLP provided cues for breath support to reduce glottal fry, sustained phonation, glides, vocal function, speech intelligibility (pauses, over articulation, pacing), and word finding strategies. SLP also with review of word finding strategies and using routines to assist with memory at home.     Assessment / Recommendations / Plan   Plan All goals met;Discharge SLP treatment due to (comment)      Progression Toward Goals   Progression toward goals Goals met, education completed,  patient discharged from [facility] - 03/13/21 [phone]    Education Details final education regarding plan for d/c and continue with HEP for breath support, speech intelligibility, and memory    Person(s) Educated Patient    Methods Explanation;Handout    Comprehension Verbalized understanding            SLP Short Term Goals - 03/13/21 0937      SLP SHORT TERM GOAL #1   Title Pt will increase conversation level speech intelligibility to 80% by utilizing appropriate strategies and listener needing repetition no more than 3x in a given conversation.    Baseline 75% intelligibility    Time 4    Period Weeks    Status Partially Met    Target Date 03/17/21      SLP SHORT TERM GOAL #2   Title Pt will implement word-finding strategies with 90% accuracy when unable to verbalize desired word in conversation/functional tasks with mi/mod assist.    Baseline 75%    Time 4    Period Weeks    Status Achieved    Target Date 03/17/21      SLP SHORT TERM GOAL #3   Title Pt will complete moderate level verbal expression tasks (object description, state function, provide short summary, etc) using short sentences with 90%  acc and min assist.    Baseline 75%    Time 4    Period Weeks    Status Achieved    Target Date 03/17/21      SLP SHORT TERM GOAL #4   Title Pt will utilize speech intelligibility strategies (over articulation, reduced speaking rate, and vocal intensity) at the word/sentence/phrase level with 80% acc and min cues.    Baseline 75% intelligible given sentences to repeat    Time 4    Period Weeks    Status Achieved    Target Date 03/17/21            SLP Long Term Goals - 03/13/21 0945      SLP LONG TERM GOAL #1   Title same as short term goals            Plan - 03/13/21 0936    Clinical Impression Statement Pt was able to state speech intelligibility strategies with use of picture/written cues (slow down, open mouth, think before speaking, and speak slowly). He answered open ended questions with 100% acc with min requests for clarification of misunderstood speech. He completed "salient features" activity with 100% acc with mi/mod cues. Pt continues to struggle with breath support during speech production (breath holding and glottal fry). Pt has made modest progress overall and has begun to plateau. Pt has limited literacy skills, but has been given a home program to continue with going forward. Plan to d/c from SLP services at this time.    Treatment/Interventions Compensatory strategies;Patient/family education;Cueing hierarchy;Multimodal communcation approach;Compensatory techniques;SLP instruction and feedback;Internal/external aids    Potential to Achieve Goals Fair    Potential Considerations Ability to learn/carryover information    SLP Home Exercise Plan Pt will complete HEP as assigned to facilitate carryover of treatment strategies and techniques in the home environment    Consulted and Agree with Plan of Care Patient           Patient will benefit from skilled therapeutic intervention in order to improve the following deficits and impairments:   Dysarthria and  anarthria  Cognitive communication deficit  Problem List Patient Active Problem List   Diagnosis Date Noted  . CVA (cerebral vascular accident) ([place]) 10/07/2020  . Syncope 04/18/2019  . Chronic systolic heart failure ([place])   . Pain of both hip joints   . History of renal cell carcinoma   . Renal mass 03/30/2019  . Abdominal hematoma 08/13/2018  . Radiculopathy 06/16/2018  . Acute CVA (cerebrovascular accident) ([facility]) 02/01/2017  . Diabetes mellitus ([place] [facility]) 02/01/2017  . HTN (hypertension) 02/01/2017  . Primary osteoarthritis of left hip 01/08/2016  . DJD (degenerative joint disease) 03/20/2015  . Primary osteoarthritis of right hip 02/26/2015  . Lumbar spondylosis 10/17/2014  . Lumbago 07/25/2014  . Abnormality of gait 07/25/2014  . Difficulty in walking(719.7) 07/25/2014  . TIA (transient ischemic attack) 03/09/2013  . Cocaine abuse ([place]) 03/09/2013  . Accelerated hypertension 03/09/2013  . Hyperlipidemia 03/09/2013  . Current smoker 03/09/2013   SPEECH THERAPY DISCHARGE SUMMARY  Visits from Start of Care: 5  Current functional level related to goals / functional outcomes: See above   Remaining deficits: Mild/mod cognitive communication deficits with dysarthria and impaired working Management consultant / Equipment: HEP provided  Plan: Patient agrees to discharge.  Patient goals were partially met. Patient is being discharged due to meeting the stated rehab goals.  ?????        Thank you,  Genene Churn, [place]  [facility] 03/13/2021, 9:49 AM  [facility] [address] [place], [place], [address] Phone: [phone]   Fax:  [phone]   Name: Tauren Delbuono. MRN: [id number] Date of Birth: [birth year]

## 2021-03-14 ENCOUNTER — Encounter (HOSPITAL_COMMUNITY): Payer: Self-pay

## 2021-03-14 ENCOUNTER — Ambulatory Visit (HOSPITAL_COMMUNITY): Payer: Medicaid Other

## 2021-03-14 ENCOUNTER — Encounter (HOSPITAL_COMMUNITY): Payer: Medicaid Other

## 2021-03-25 ENCOUNTER — Encounter: Payer: Medicaid Other | Admitting: Neurology

## 2021-03-25 DIAGNOSIS — G4733 Obstructive sleep apnea (adult) (pediatric): Secondary | ICD-10-CM

## 2021-04-12 NOTE — Progress Notes (Signed)
This encounter was created in error - please disregard.

## 2021-06-24 ENCOUNTER — Emergency Department (HOSPITAL_COMMUNITY): Payer: Medicaid Other

## 2021-06-24 ENCOUNTER — Emergency Department (HOSPITAL_COMMUNITY)
Admission: EM | Admit: 2021-06-24 | Discharge: 2021-06-24 | Disposition: A | Discharge status: Home / Self Care | Payer: Medicaid Other | Attending: Emergency Medicine | Admitting: Emergency Medicine

## 2021-06-24 ENCOUNTER — Encounter (HOSPITAL_COMMUNITY): Payer: Self-pay | Admitting: Emergency Medicine

## 2021-06-24 ENCOUNTER — Other Ambulatory Visit: Payer: Self-pay

## 2021-06-24 DIAGNOSIS — I110 Hypertensive heart disease with heart failure: Secondary | ICD-10-CM | POA: Insufficient documentation

## 2021-06-24 DIAGNOSIS — I5022 Chronic systolic (congestive) heart failure: Secondary | ICD-10-CM | POA: Insufficient documentation

## 2021-06-24 DIAGNOSIS — S161XXA Strain of muscle, fascia and tendon at neck level, initial encounter: Secondary | ICD-10-CM

## 2021-06-24 DIAGNOSIS — W07XXXA Fall from chair, initial encounter: Secondary | ICD-10-CM | POA: Diagnosis not present

## 2021-06-24 DIAGNOSIS — Z79899 Other long term (current) drug therapy: Secondary | ICD-10-CM | POA: Diagnosis not present

## 2021-06-24 DIAGNOSIS — Z7984 Long term (current) use of oral hypoglycemic drugs: Secondary | ICD-10-CM | POA: Insufficient documentation

## 2021-06-24 DIAGNOSIS — Z96643 Presence of artificial hip joint, bilateral: Secondary | ICD-10-CM | POA: Diagnosis not present

## 2021-06-24 DIAGNOSIS — E119 Type 2 diabetes mellitus without complications: Secondary | ICD-10-CM | POA: Diagnosis not present

## 2021-06-24 DIAGNOSIS — W19XXXA Unspecified fall, initial encounter: Secondary | ICD-10-CM

## 2021-06-24 DIAGNOSIS — S199XXA Unspecified injury of neck, initial encounter: Secondary | ICD-10-CM | POA: Diagnosis present

## 2021-06-24 DIAGNOSIS — Z85528 Personal history of other malignant neoplasm of kidney: Secondary | ICD-10-CM | POA: Diagnosis not present

## 2021-06-24 DIAGNOSIS — F17210 Nicotine dependence, cigarettes, uncomplicated: Secondary | ICD-10-CM | POA: Insufficient documentation

## 2021-06-24 DIAGNOSIS — M25511 Pain in right shoulder: Secondary | ICD-10-CM | POA: Insufficient documentation

## 2021-06-24 MED ORDER — OXYCODONE-ACETAMINOPHEN 5-325 MG PO TABS
1.0000 | ORAL_TABLET | Freq: Once | ORAL | Status: AC
  Administered 2021-06-24: 1 via ORAL
  Filled 2021-06-24: qty 1

## 2021-06-24 NOTE — Discharge Instructions (Addendum)
You are seen in the emergency department for evaluation of injuries from a fall.  You had a CAT scan of your cervical spine and x-rays of your right shoulder that did not show any obvious fracture or dislocation.  Please apply ice to the affected area.  Tylenol and ibuprofen for pain.  Follow-up with your doctor.  Return to the emergency department if any worsening or concerning symptoms

## 2021-06-24 NOTE — ED Triage Notes (Signed)
Pt was in room with wife and going to sit down but chair moved and pt fell in the floor. Pt c/o right shoulder/neck pain. C collar placed on pt and pt back boarded to stretcher.

## 2021-06-24 NOTE — ED Provider Notes (Signed)
[facility] EMERGENCY DEPARTMENT Provider Note   CSN: [id number] Arrival date & time: 06/24/21  1921     History Chief Complaint  Patient presents with   Raymond Barrera. is a 60 y.o. male.  He is complaining of neck and right shoulder pain after he fell while visiting his wife here in the emergency department.  He said the chair moved out from underneath him when he was trying to sit down and he fell to the floor.  No loss of consciousness.  No numbness or weakness.  No headache.  Not on any blood thinners.  The history is provided by the patient.  Fall This is a new problem. The current episode started 1 to 2 hours ago. The problem occurs constantly. The problem has not changed since onset.Pertinent negatives include no chest pain, no abdominal pain, no headaches and no shortness of breath. The symptoms are aggravated by bending and twisting. Nothing relieves the symptoms. He has tried nothing for the symptoms. The treatment provided no relief.      Past Medical History:  Diagnosis Date   Ambulates with cane    straight - uses occasionally   Anxiety    due to the stroke   Arthritis    Back pain    hx of buldging disc   Complication of anesthesia    took a while for him to wake up after previous anesthesia   Diabetes mellitus without complication ([place])    Family history of adverse reaction to anesthesia    "sometimes mom has a hard time waking up"   High cholesterol    takes Zocor daily   Hypertension    takes Benazepril and HCTZ  daily   Joint pain    Joint swelling    Memory impairment    occassional - from stroke   Myocardial infarction ([facility]) 1987   Pneumonia    hx of-80's   Shortness of breath dyspnea    do to pain   Sleep apnea    never had a sleep study,but states Dr. Cindie Laroche says he has it   Slurred speech    Stroke ([facility]) 08/2013   7 mini-strokes, last stroke 2017   TIA (transient ischemic attack) 2014   x 7    Urinary frequency      Patient Active Problem List   Diagnosis Date Noted   CVA (cerebral vascular accident) ([place]) 10/07/2020   Syncope [id number]   Chronic systolic heart failure ([place])    Pain of both hip joints    History of renal cell carcinoma    Renal mass 03/30/2019   Abdominal hematoma 08/13/2018   Radiculopathy 06/16/2018   Acute CVA (cerebrovascular accident) ([place]) 02/01/2017   Diabetes mellitus ([place]) 02/01/2017   HTN (hypertension) 02/01/2017   Primary osteoarthritis of left hip 01/08/2016   DJD (degenerative joint disease) 03/20/2015   Primary osteoarthritis of right hip 02/26/2015   Lumbar spondylosis 10/17/2014   Lumbago 07/25/2014   Abnormality of gait 07/25/2014   Difficulty in walking(719.7) 07/25/2014   TIA (transient ischemic attack) 03/09/2013   Cocaine abuse ([place]) 03/09/2013   Accelerated hypertension 03/09/2013   Hyperlipidemia 03/09/2013   Current smoker 03/09/2013    Past Surgical History:  Procedure Laterality Date   ABDOMINAL EXPOSURE N/A 06/16/2018   Procedure: ABDOMINAL EXPOSURE;  Surgeon: Rosetta Posner, MD;  Location: [facility];  Service: Vascular;  Laterality: N/A;   ANKLE SURGERY  2008   left ankle-otif-[facility]  ANTERIOR LUMBAR FUSION Bilateral 06/16/2018   Procedure: LUMBAR 4-5 LUMBAR 5-SACRUM 1 ANTERIOR LUMBAR INTERBODY FUSION WITH INSTRUMENTATION AND ALLOGRAFT;  Surgeon: Phylliss Bob, MD;  Location: [facility];  Service: Orthopedics;  Laterality: Bilateral;   BACK SURGERY     HEMATOMA EVACUATION Left 08/13/2018   Procedure: EVACUATION HEMATOMA LEFT ABDOMINAL WALL;  Surgeon: Rosetta Posner, MD;  [facility];  Service: Vascular;  Laterality: Left;   IR RADIOLOGIST EVAL & MGMT  07/27/2018   IR US GUIDE BX ASP/DRAIN  07/14/2018   JOINT REPLACEMENT     both hips replaced    LUMBAR LAMINECTOMY/DECOMPRESSION MICRODISCECTOMY Right 10/17/2014   Procedure: LUMBAR LAMINECTOMY/DECOMPRESSION MICRODISCECTOMY 2 LEVELS;  Surgeon: [name], MD;  Location: [facility];   Service: Neurosurgery;  Laterality: Right;  Right L45 L5S1 laminectomy and foraminotomy   MASS EXCISION  09/13/2012   Procedure: EXCISION MASS;  Surgeon: Jamesetta So, MD;  [facility];  Service: General;  Laterality: N/A;   ROBOTIC ASSITED PARTIAL NEPHRECTOMY Left 03/30/2019   Procedure: XI ROBOTIC ASSITED PARTIAL NEPHRECTOMY POSSIBLE RADICAL NEPHRECTOMY;  Surgeon: Ceasar Mons, MD;  [facility];  Service: Urology;  Laterality: Left;   SHOULDER ARTHROSCOPY WITH ROTATOR CUFF REPAIR Right 04/12/2020   Procedure: right shoulder arthroscopy, debridement, mini open rotator cuff tear repair;  Surgeon: Meredith Pel, MD;  Location: [facility];  Service: Orthopedics;  Laterality: Right;   TONSILLECTOMY     TOTAL HIP ARTHROPLASTY Right 03/20/2015   TOTAL HIP ARTHROPLASTY Right 03/20/2015   Procedure: TOTAL HIP ARTHROPLASTY ANTERIOR APPROACH;  Surgeon: Renette Butters, MD;  Location: [facility];  Service: Orthopedics;  Laterality: Right;   TOTAL HIP ARTHROPLASTY Left 01/08/2016   Procedure: TOTAL HIP ARTHROPLASTY ANTERIOR APPROACH;  Surgeon: Renette Butters, MD;  Location: [facility];  Service: Orthopedics;  Laterality: Left;       Family History  Problem Relation Age of Onset   Heart disease Father     Social History   Tobacco Use   Smoking status: Some Days    Packs/day: 0.25    Years: 47.00    Pack years: 11.75    Types: Cigarettes   Smokeless tobacco: Never  Vaping Use   Vaping Use: Never used  Substance Use Topics   Alcohol use: Not Currently    Comment: quit 2012   Drug use: Not Currently    Types: Cocaine    Comment: many yrs ago., last time- late 2016    Home Medications Prior to Admission medications   Medication Sig Start Date End Date Taking? Authorizing Provider  acetaminophen (TYLENOL) 500 MG tablet Take 1,000 mg by mouth every 4 (four) hours as needed for mild pain.    [provider]  amLODipine (NORVASC) 10 MG tablet Take 10 mg by mouth daily.   07/19/19   [provider]  cholecalciferol (VITAMIN D3) 25 MCG (1000 UNIT) tablet Take 1,000 Units by mouth daily.    [provider]  gabapentin (NEURONTIN) 300 MG capsule Take 1 capsule (300 mg total) by mouth 3 (three) times daily. Patient taking differently: Take 300 mg by mouth 3 (three) times daily as needed (nerve/joint pain and to help relax).  05/11/20   Meredith Pel, MD  glucose 4 GM chewable tablet Chew 1 tablet by mouth as needed for low blood sugar.    [provider]  labetalol (NORMODYNE) 300 MG tablet Take 150 mg by mouth 2 (two) times daily.  07/19/19   [provider]  lidocaine (LIDODERM) 5 % Place 1 patch onto the skin daily. Remove & Discard patch within 12 hours or as directed by MD Patient taking differently: Place 1 patch onto the skin daily as needed (PAIN). Remove & Discard patch within 12 hours or as directed by MD 07/10/19   Langston Masker B, PA-C  metFORMIN (GLUCOPHAGE) 500 MG tablet Take 500 mg by mouth 2 (two) times daily with a meal.     [provider]  ondansetron (ZOFRAN) 4 MG tablet Take 1 tablet (4 mg total) by mouth every 8 (eight) hours as needed for nausea or vomiting. 03/30/19   Debbrah Alar, PA-C  simvastatin (ZOCOR) 40 MG tablet Take 40 mg by mouth every evening.  07/19/19   [provider]  tiZANidine (ZANAFLEX) 4 MG tablet Take 0.5-1 tablets (2-4 mg total) by mouth 2 (two) times daily as needed for muscle spasms. 05/11/20   Meredith Pel, MD    Allergies    Poison ivy extract [poison ivy extract]  Review of Systems   Review of Systems  Constitutional:  Negative for fever.  HENT:  Negative for sore throat.   Eyes:  Negative for visual disturbance.  Respiratory:  Negative for shortness of breath.   Cardiovascular:  Negative for chest pain.  Gastrointestinal:  Negative for abdominal pain.  Genitourinary:  Negative for dysuria.  Musculoskeletal:  Positive for neck pain.  Skin:  Negative  for rash.  Neurological:  Negative for headaches.   Physical Exam Updated Vital Signs BP (!) 150/103 (BP Location: Left Arm)   Pulse 82   Temp 98.1 F (36.7 C) (Oral)   Resp 20   Ht '6\' 6"'$  (1.981 m)   Wt 103.4 kg   SpO2 100%   BMI 26.35 kg/m   Physical Exam Vitals and nursing note reviewed.  Constitutional:      Appearance: Normal appearance. He is well-developed.  HENT:     Head: Normocephalic and atraumatic.  Eyes:     Conjunctiva/sclera: Conjunctivae normal.  Cardiovascular:     Rate and Rhythm: Normal rate and regular rhythm.     Heart sounds: No murmur heard. Pulmonary:     Effort: Pulmonary effort is normal. No respiratory distress.     Breath sounds: Normal breath sounds.  Abdominal:     Palpations: Abdomen is soft.     Tenderness: There is no abdominal tenderness.  Musculoskeletal:        General: Tenderness (r shoulder) present. Normal range of motion.     Cervical back: Neck supple. Tenderness present.  Skin:    General: Skin is warm and dry.  Neurological:     General: No focal deficit present.     Mental Status: He is alert.     Sensory: No sensory deficit.     Motor: No weakness.    ED Results / Procedures / Treatments   Labs (all labs ordered are listed, but only abnormal results are displayed) Labs Reviewed - No data to display  EKG None  Radiology DG Shoulder Right  Result Date: 06/24/2021 CLINICAL DATA:  69 male with right shoulder pain. EXAM: RIGHT SHOULDER - 2+ VIEW COMPARISON:  Right shoulder radiograph dated 01/11/2020. FINDINGS: There is no acute fracture or dislocation. No significant arthritic changes of the shoulder. There is degenerative changes of the right AC joint. The soft tissues are unremarkable. IMPRESSION: No acute fracture or dislocation. Electronically Signed   By: Anner Crete M.D.   On: 06/24/2021 20:34  CT Cervical Spine Wo Contrast  Result Date: 06/24/2021 CLINICAL DATA:  Fall, neck pain EXAM: CT CERVICAL SPINE  WITHOUT CONTRAST TECHNIQUE: Multidetector CT imaging of the cervical spine was performed without intravenous contrast. Multiplanar CT image reconstructions were also generated. COMPARISON:  MR cervical spine, 09/01/2020 FINDINGS: Alignment: Degenerative and or positional straightening of the normal cervical lordosis. Skull base and vertebrae: No acute fracture. No primary bone lesion or focal pathologic process. Soft tissues and spinal canal: No prevertebral fluid or swelling. No visible canal hematoma. Disc levels: Moderate multilevel disc space height loss and osteophytosis. Upper chest: Negative. Other: None. IMPRESSION: 1. No fracture or static subluxation of the cervical spine. 2. Moderate multilevel cervical disc degenerative disease. Electronically Signed   By: Eddie Candle M.D.   On: 06/24/2021 20:32    Procedures Procedures   Medications Ordered in ED Medications - No data to display  ED Course  I have reviewed the triage vital signs and the nursing notes.  Pertinent labs & imaging results that were available during my care of the patient were reviewed by me and considered in my medical decision making (see chart for details).    MDM Rules/Calculators/A&P                          This patient complains of fall neck pain right shoulder pain; this involves an extensive number of treatment Options and is a complaint that carries with it a high risk of complications and Morbidity. The differential includes fracture, contusion, strain, dislocation  I ordered medication oral Percocet I ordered imaging studies which included CT C-spine and x-ray right shoulder and I independently    visualized and interpreted imaging which showed no acute findings Additional history obtained from patient's wife Previous records obtained and reviewed in epic no recent  After the interventions stated above, I reevaluated the patient and found patient's pain to be controlled and otherwise improved.   Recommended close follow-up with his primary care doctor.  Return instructions discussed   Final Clinical Impression(s) / ED Diagnoses Final diagnoses:  Fall, initial encounter  Strain of neck muscle, initial encounter  Acute pain of right shoulder    Rx / DC Orders ED Discharge Orders     None        Hayden Rasmussen, MD 06/25/21 1529

## 2021-06-24 NOTE — ED Notes (Signed)
CT result negative, C-collar removed for pt comfort.

## 2021-07-01 ENCOUNTER — Other Ambulatory Visit: Payer: Self-pay | Admitting: Internal Medicine

## 2021-07-01 DIAGNOSIS — Z87891 Personal history of nicotine dependence: Secondary | ICD-10-CM

## 2021-07-12 ENCOUNTER — Emergency Department (HOSPITAL_COMMUNITY): Payer: Medicaid Other

## 2021-07-12 ENCOUNTER — Encounter (HOSPITAL_COMMUNITY): Payer: Self-pay

## 2021-07-12 ENCOUNTER — Emergency Department (HOSPITAL_COMMUNITY)
Admission: EM | Admit: 2021-07-12 | Discharge: 2021-07-12 | Disposition: A | Discharge status: Home / Self Care | Payer: Medicaid Other | Attending: Emergency Medicine | Admitting: Emergency Medicine

## 2021-07-12 ENCOUNTER — Other Ambulatory Visit: Payer: Self-pay

## 2021-07-12 DIAGNOSIS — R110 Nausea: Secondary | ICD-10-CM | POA: Insufficient documentation

## 2021-07-12 DIAGNOSIS — Z7984 Long term (current) use of oral hypoglycemic drugs: Secondary | ICD-10-CM | POA: Insufficient documentation

## 2021-07-12 DIAGNOSIS — E119 Type 2 diabetes mellitus without complications: Secondary | ICD-10-CM | POA: Diagnosis not present

## 2021-07-12 DIAGNOSIS — I110 Hypertensive heart disease with heart failure: Secondary | ICD-10-CM | POA: Diagnosis not present

## 2021-07-12 DIAGNOSIS — I5022 Chronic systolic (congestive) heart failure: Secondary | ICD-10-CM | POA: Diagnosis not present

## 2021-07-12 DIAGNOSIS — Z8673 Personal history of transient ischemic attack (TIA), and cerebral infarction without residual deficits: Secondary | ICD-10-CM | POA: Insufficient documentation

## 2021-07-12 DIAGNOSIS — F17210 Nicotine dependence, cigarettes, uncomplicated: Secondary | ICD-10-CM | POA: Insufficient documentation

## 2021-07-12 DIAGNOSIS — R55 Syncope and collapse: Secondary | ICD-10-CM | POA: Insufficient documentation

## 2021-07-12 DIAGNOSIS — R61 Generalized hyperhidrosis: Secondary | ICD-10-CM | POA: Diagnosis not present

## 2021-07-12 DIAGNOSIS — Z96643 Presence of artificial hip joint, bilateral: Secondary | ICD-10-CM | POA: Diagnosis not present

## 2021-07-12 DIAGNOSIS — Z79899 Other long term (current) drug therapy: Secondary | ICD-10-CM | POA: Insufficient documentation

## 2021-07-12 LAB — CBC WITH DIFFERENTIAL/PLATELET
Abs Immature Granulocytes: 0.04 10*3/uL (ref 0.00–0.07)
Basophils Absolute: 0 10*3/uL (ref 0.0–0.1)
Basophils Relative: 0 %
Eosinophils Absolute: 0.1 10*3/uL (ref 0.0–0.5)
Eosinophils Relative: 1 %
HCT: 36.6 % — ABNORMAL LOW (ref 39.0–52.0)
Hemoglobin: 11.5 g/dL — ABNORMAL LOW (ref 13.0–17.0)
Immature Granulocytes: 0 %
Lymphocytes Relative: 10 %
Lymphs Abs: 1 10*3/uL (ref 0.7–4.0)
MCH: 28.6 pg (ref 26.0–34.0)
MCHC: 31.4 g/dL (ref 30.0–36.0)
MCV: 91 fL (ref 80.0–100.0)
Monocytes Absolute: 0.4 10*3/uL (ref 0.1–1.0)
Monocytes Relative: 4 %
Neutro Abs: 8.7 10*3/uL — ABNORMAL HIGH (ref 1.7–7.7)
Neutrophils Relative %: 85 %
Platelets: 226 10*3/uL (ref 150–400)
RBC: 4.02 MIL/uL — ABNORMAL LOW (ref 4.22–5.81)
RDW: 14.9 % (ref 11.5–15.5)
WBC: 10.4 10*3/uL (ref 4.0–10.5)
nRBC: 0 % (ref 0.0–0.2)

## 2021-07-12 LAB — URINALYSIS, ROUTINE W REFLEX MICROSCOPIC
Bilirubin Urine: NEGATIVE
Glucose, UA: NEGATIVE mg/dL
Hgb urine dipstick: NEGATIVE
Ketones, ur: NEGATIVE mg/dL
Leukocytes,Ua: NEGATIVE
Nitrite: NEGATIVE
Protein, ur: 100 mg/dL — AB
Specific Gravity, Urine: 1.02 (ref 1.005–1.030)
pH: 5 (ref 5.0–8.0)

## 2021-07-12 LAB — RAPID URINE DRUG SCREEN, HOSP PERFORMED
Amphetamines: NOT DETECTED
Barbiturates: NOT DETECTED
Benzodiazepines: NOT DETECTED
Cocaine: POSITIVE — AB
Opiates: NOT DETECTED
Tetrahydrocannabinol: NOT DETECTED

## 2021-07-12 LAB — BASIC METABOLIC PANEL
Anion gap: 6 (ref 5–15)
BUN: 24 mg/dL — ABNORMAL HIGH (ref 8–23)
CO2: 26 mmol/L (ref 22–32)
Calcium: 9.6 mg/dL (ref 8.9–10.3)
Chloride: 106 mmol/L (ref 98–111)
Creatinine, Ser: 2.07 mg/dL — ABNORMAL HIGH (ref 0.61–1.24)
GFR, Estimated: 36 mL/min — ABNORMAL LOW (ref >60)
Glucose, Bld: 173 mg/dL — ABNORMAL HIGH (ref 70–99)
Potassium: 4.6 mmol/L (ref 3.5–5.1)
Sodium: 138 mmol/L (ref 135–145)

## 2021-07-12 LAB — TROPONIN I (HIGH SENSITIVITY)
Troponin I (High Sensitivity): 9 ng/L (ref <18)
Troponin I (High Sensitivity): 11 ng/L (ref <18)

## 2021-07-12 MED ORDER — ONDANSETRON HCL 4 MG/2ML IJ SOLN
4.0000 mg | Freq: Once | INTRAMUSCULAR | Status: AC
  Administered 2021-07-12: 4 mg via INTRAVENOUS
  Filled 2021-07-12: qty 2

## 2021-07-12 MED ORDER — SODIUM CHLORIDE 0.9 % IV BOLUS
1000.0000 mL | Freq: Once | INTRAVENOUS | Status: AC
  Administered 2021-07-12: 1000 mL via INTRAVENOUS

## 2021-07-12 NOTE — ED Notes (Signed)
Pt d/c home per MD order. Discharge summary reviewed, pt verbalizes understanding. No s/s of acute distress noted. Pt ambulatory off unit. Discharged home with family.

## 2021-07-12 NOTE — ED Triage Notes (Signed)
Pt BIB [facility] from [facility] c/o a syncopal episode. Pt was sitting in a chair at the office when it happened so pt did not fall and hit the floor. Pt became diaphoretic during the episode. Initially [name] BP was 88/60 and HR was 48. Pt did test positive for oxy and cocaine at the office and they gave .4 of narcan. EMS gave a 500 NS bolus and now HR is 54 and BP is 128/86. Pt denies any pain at this time.

## 2021-07-12 NOTE — ED Provider Notes (Signed)
[place] EMERGENCY DEPARTMENT Provider Note   CSN: [id number] Arrival date & time: 07/12/21  1534     History No chief complaint on file.   Raymond Barrera. is a 60 y.o. male.  He is here for evaluation of a syncopal event.  He said he was at his doctor's office for routine checkup sitting in the chair when he passed out.  Initial blood pressure was low along with bradycardia.  Was diaphoretic.  Was given Narcan and fluids with improvement in his symptoms.  Patient denies any alcohol or drugs.  His only complaint right now is nausea.  No chest pain shortness of breath headache numbness or weakness.  He said he has had syncopal events in the past.  The history is provided by the patient.  Loss of Consciousness Episode history:  Single Most recent episode:  Today Progression:  Resolved Chronicity:  Recurrent Context: sitting down   Relieved by: narcan. Worsened by:  Nothing Ineffective treatments:  None tried Associated symptoms: nausea   Associated symptoms: no chest pain, no difficulty breathing, no fever, no focal weakness, no headaches, no shortness of breath, no visual change, no vomiting and no weakness       Past Medical History:  Diagnosis Date   Ambulates with cane    straight - uses occasionally   Anxiety    due to the stroke   Arthritis    Back pain    hx of buldging disc   Complication of anesthesia    took a while for him to wake up after previous anesthesia   Diabetes mellitus without complication ([place])    Family history of adverse reaction to anesthesia    "sometimes mom has a hard time waking up"   High cholesterol    takes Zocor daily   Hypertension    takes Benazepril and HCTZ  daily   Joint pain    Joint swelling    Memory impairment    occassional - from stroke   Myocardial infarction ([place]) 1987   Pneumonia    hx of-80's   Shortness of breath dyspnea    do to pain   Sleep apnea    never had a sleep study,but states Dr.  Cindie Laroche says he has it   Slurred speech    Stroke ([place]) 08/2013   7 mini-strokes, last stroke 2017   TIA (transient ischemic attack) 2014   x 7    Urinary frequency     Patient Active Problem List   Diagnosis Date Noted   CVA (cerebral vascular accident) ([place]) 10/07/2020   Syncope [id number]   Chronic systolic heart failure (HCC)    Pain of both hip joints    History of renal cell carcinoma    Renal mass 03/30/2019   Abdominal hematoma 08/13/2018   Radiculopathy 06/16/2018   Acute CVA (cerebrovascular accident) ([place]) 02/01/2017   Diabetes mellitus ([place]) 02/01/2017   HTN (hypertension) 02/01/2017   Primary osteoarthritis of left hip 01/08/2016   DJD (degenerative joint disease) 03/20/2015   Primary osteoarthritis of right hip 02/26/2015   Lumbar spondylosis 10/17/2014   Lumbago 07/25/2014   Abnormality of gait 07/25/2014   Difficulty in walking(719.7) 07/25/2014   TIA (transient ischemic attack) 03/09/2013   Cocaine abuse ([place]) 03/09/2013   Accelerated hypertension 03/09/2013   Hyperlipidemia 03/09/2013   Current smoker 03/09/2013    Past Surgical History:  Procedure Laterality Date   ABDOMINAL EXPOSURE N/A 06/16/2018   Procedure: ABDOMINAL EXPOSURE;  Surgeon:  Rosetta Posner, MD;  Location: [facility] OR;  Service: Vascular;  Laterality: N/A;   ANKLE SURGERY  2008   left ankle-otif-[facility]   ANTERIOR LUMBAR FUSION Bilateral 06/16/2018   Procedure: LUMBAR 4-5 LUMBAR 5-SACRUM 1 ANTERIOR LUMBAR INTERBODY FUSION WITH INSTRUMENTATION AND ALLOGRAFT;  Surgeon: Phylliss Bob, MD;  Location: [facility];  Service: Orthopedics;  Laterality: Bilateral;   BACK SURGERY     HEMATOMA EVACUATION Left 08/13/2018   Procedure: EVACUATION HEMATOMA LEFT ABDOMINAL WALL;  Surgeon: Rosetta Posner, MD;  Location: [facility] OR;  Service: Vascular;  Laterality: Left;   IR RADIOLOGIST EVAL & MGMT  07/27/2018   IR US GUIDE BX ASP/DRAIN  07/14/2018   JOINT REPLACEMENT     both hips replaced    LUMBAR  LAMINECTOMY/DECOMPRESSION MICRODISCECTOMY Right 10/17/2014   Procedure: LUMBAR LAMINECTOMY/DECOMPRESSION MICRODISCECTOMY 2 LEVELS;  Surgeon: [name], MD;  Location: [place] [facility] OR[facility];  Service: Neurosurgery;  Laterality: Right;  Right L45 L5S1 laminectomy and foraminotomy   MASS EXCISION  09/13/2012   Procedure: EXCISION MASS;  Surgeon: Jamesetta So, MD;  Location: [facility] ORS;  Service: General;  Laterality: N/A;   ROBOTIC ASSITED PARTIAL NEPHRECTOMY Left 03/30/2019   Procedure: XI ROBOTIC ASSITED PARTIAL NEPHRECTOMY POSSIBLE RADICAL NEPHRECTOMY;  Surgeon: Ceasar Mons, MD;  Location: [facility] ORS;  Service: Urology;  Laterality: Left;   SHOULDER ARTHROSCOPY WITH ROTATOR CUFF REPAIR Right 04/12/2020   Procedure: right shoulder arthroscopy, debridement, mini open rotator cuff tear repair;  Surgeon: Meredith Pel, MD;  Location: [facility];  Service: Orthopedics;  Laterality: Right;   TONSILLECTOMY     TOTAL HIP ARTHROPLASTY Right 03/20/2015   TOTAL HIP ARTHROPLASTY Right 03/20/2015   Procedure: TOTAL HIP ARTHROPLASTY ANTERIOR APPROACH;  Surgeon: Renette Butters, MD;  Location: [place];  Service: Orthopedics;  Laterality: Right;   TOTAL HIP ARTHROPLASTY Left 01/08/2016   Procedure: TOTAL HIP ARTHROPLASTY ANTERIOR APPROACH;  Surgeon: Renette Butters, MD;  Location: [place];  Service: Orthopedics;  Laterality: Left;       Family History  Problem Relation Age of Onset   Heart disease Father     Social History   Tobacco Use   Smoking status: Some Days    Packs/day: 0.25    Years: 47.00    Pack years: 11.75    Types: Cigarettes   Smokeless tobacco: Never  Vaping Use   Vaping Use: Never used  Substance Use Topics   Alcohol use: Not Currently    Comment: quit 2012   Drug use: Not Currently    Types: Cocaine    Comment: many yrs ago., last time- late 2016    Home Medications Prior to Admission medications   Medication Sig Start Date End Date Taking? Authorizing Provider   acetaminophen (TYLENOL) 500 MG tablet Take 1,000 mg by mouth every 4 (four) hours as needed for mild pain.    [provider]  amLODipine (NORVASC) 10 MG tablet Take 10 mg by mouth daily.  07/19/19   [provider]  cholecalciferol (VITAMIN D3) 25 MCG (1000 UNIT) tablet Take 1,000 Units by mouth daily.    [provider]  gabapentin (NEURONTIN) 300 MG capsule Take 1 capsule (300 mg total) by mouth 3 (three) times daily. Patient taking differently: Take 300 mg by mouth 3 (three) times daily as needed (nerve/joint pain and to help relax).  05/11/20   Meredith Pel, MD  glucose 4 GM chewable tablet Chew 1 tablet by mouth as needed for low blood sugar.  [provider]  labetalol (NORMODYNE) 300 MG tablet Take 150 mg by mouth 2 (two) times daily.  07/19/19   [provider]  lidocaine (LIDODERM) 5 % Place 1 patch onto the skin daily. Remove & Discard patch within 12 hours or as directed by MD Patient taking differently: Place 1 patch onto the skin daily as needed (PAIN). Remove & Discard patch within 12 hours or as directed by MD 07/10/19   Langston Masker B, PA-C  metFORMIN (GLUCOPHAGE) 500 MG tablet Take 500 mg by mouth 2 (two) times daily with a meal.     [provider]  ondansetron (ZOFRAN) 4 MG tablet Take 1 tablet (4 mg total) by mouth every 8 (eight) hours as needed for nausea or vomiting. 03/30/19   Debbrah Alar, PA-C  simvastatin (ZOCOR) 40 MG tablet Take 40 mg by mouth every evening.  07/19/19   [provider]  tiZANidine (ZANAFLEX) 4 MG tablet Take 0.5-1 tablets (2-4 mg total) by mouth 2 (two) times daily as needed for muscle spasms. 05/11/20   Meredith Pel, MD    Allergies    Poison ivy extract [poison ivy extract]  Review of Systems   Review of Systems  Constitutional:  Negative for fever.  HENT:  Negative for sore throat.   Respiratory:  Negative for shortness of breath.   Cardiovascular:  Positive for  syncope. Negative for chest pain.  Gastrointestinal:  Positive for nausea. Negative for abdominal pain and vomiting.  Genitourinary:  Negative for dysuria.  Musculoskeletal:  Negative for neck pain.  Skin:  Negative for rash.  Neurological:  Positive for syncope. Negative for focal weakness, weakness and headaches.   Physical Exam Updated Vital Signs BP 127/85 (BP Location: Right Arm)   Pulse (!) 58   Temp (!) 97.5 F (36.4 C) (Oral)   Resp 16   Ht '6\' 6"'$  (1.981 m)   Wt 106.6 kg   SpO2 96%   BMI 27.16 kg/m   Physical Exam Vitals and nursing note reviewed.  Constitutional:      Appearance: Normal appearance. He is well-developed.  HENT:     Head: Normocephalic and atraumatic.  Eyes:     Conjunctiva/sclera: Conjunctivae normal.  Cardiovascular:     Rate and Rhythm: Normal rate and regular rhythm.     Heart sounds: No murmur heard. Pulmonary:     Effort: Pulmonary effort is normal. No respiratory distress.     Breath sounds: Normal breath sounds.  Abdominal:     Palpations: Abdomen is soft.     Tenderness: There is no abdominal tenderness. There is no guarding or rebound.  Musculoskeletal:        General: No deformity or signs of injury. Normal range of motion.     Cervical back: Neck supple.  Skin:    General: Skin is warm and dry.  Neurological:     General: No focal deficit present.     Mental Status: He is alert and oriented to person, place, and time.     Sensory: No sensory deficit.     Motor: No weakness.    ED Results / Procedures / Treatments   Labs (all labs ordered are listed, but only abnormal results are displayed) Labs Reviewed  CBC WITH DIFFERENTIAL/PLATELET - Abnormal; Notable for the following components:      Result Value   RBC 4.02 (*)    Hemoglobin 11.5 (*)    HCT 36.6 (*)    Neutro Abs 8.7 (*)  All other components within normal limits  URINALYSIS, ROUTINE W REFLEX MICROSCOPIC - Abnormal; Notable for the following components:   Protein,  ur 100 (*)    Bacteria, UA RARE (*)    All other components within normal limits  RAPID URINE DRUG SCREEN, HOSP PERFORMED - Abnormal; Notable for the following components:   Cocaine POSITIVE (*)    All other components within normal limits  BASIC METABOLIC PANEL - Abnormal; Notable for the following components:   Glucose, Bld 173 (*)    BUN 24 (*)    Creatinine, Ser 2.07 (*)    GFR, Estimated 36 (*)    All other components within normal limits  TROPONIN I (HIGH SENSITIVITY)  TROPONIN I (HIGH SENSITIVITY)    EKG EKG Interpretation  Date/Time:  Friday July 12 2021 15:44:45 EDT Ventricular Rate:  56 PR Interval:  247 QRS Duration: 126 QT Interval:  482 QTC Calculation: 466 R Axis:   44 Text Interpretation: Sinus rhythm Prolonged PR interval Nonspecific intraventricular conduction delay Borderline T abnormalities, inferior leads ST elevation, consider anterior injury Confirmed by Pattricia Boss [phone]) on 07/12/2021 3:47:48 PM  Radiology DG Chest Port 1 View  Result Date: 07/12/2021 CLINICAL DATA:  Syncopal episode EXAM: PORTABLE CHEST 1 VIEW COMPARISON:  09/02/2019 FINDINGS: Cardiac shadow is enlarged. The lungs are well aerated bilaterally. Left basilar atelectasis is noted. No sizable effusion is noted. No bony abnormality is seen. IMPRESSION: Increasing left basilar atelectasis. Electronically Signed   By: Inez Catalina M.D.   On: 07/12/2021 18:26    Procedures Procedures   Medications Ordered in ED Medications  ondansetron (ZOFRAN) injection 4 mg (4 mg Intravenous Given 07/12/21 1636)  sodium chloride 0.9 % bolus 1,000 mL (0 mLs Intravenous Stopped 07/12/21 1952)    ED Course  I have reviewed the triage vital signs and the nursing notes.  Pertinent labs & imaging results that were available during my care of the patient were reviewed by me and considered in my medical decision making (see chart for details).  Clinical Course as of 07/13/21 1009  Fri Jul 12, 2021  1830  Chest x-ray interpreted by me as left base atelectasis versus infiltrate. [MB]  1921 Patient feels better he is hungry and is hoping to be discharged.  Blood pressures remained stable.  We will have him ambulate in the department to make sure he still doing okay and if so likely can discharge. [MB]    Clinical Course User Index [MB] Hayden Rasmussen, MD   MDM Rules/Calculators/A&P                          This patient complains of syncopal event; this involves an extensive number of treatment Options and is a complaint that carries with it a high risk of complications and Morbidity. The differential includes vasovagal syncope, anemia, metabolic derangement, arrhythmia, dehydration  I ordered, reviewed and interpreted labs, which included CBC with normal white count, hemoglobin slightly lower than priors, chemistries with elevated creatinine better than priors, urinalysis without signs of infection, urine tox positive for cocaine, troponins flat I ordered medication IV fluids and nausea medication with improvement of patient's symptoms I ordered imaging studies which included chest x-ray and I independently    visualized and interpreted imaging which showed atelectasis versus infiltrate left base.  Patient not having any pulmonary symptoms. Additional history obtained from patient's wife and EMS Previous records obtained and reviewed in epic, no recent admissions  After  the interventions stated above, I reevaluated the patient and found patient be awake and alert.  He is hungry and asking to be discharged.  Patient given a snack and ambulated in the department without any difficulties.  Blood pressures remained mildly elevated since presentation.  Recommended close follow-up with PCP and return instructions discussed   Final Clinical Impression(s) / ED Diagnoses Final diagnoses:  Syncope, unspecified syncope type    Rx / DC Orders ED Discharge Orders     None        Hayden Rasmussen, MD 07/13/21 1012

## 2021-07-12 NOTE — Discharge Instructions (Signed)
You were seen in the emergency department for evaluation of a fainting spell at your doctor's office.  You had lab work done along with EKG that did not show any serious findings.  Please stay well-hydrated.  Continue your regular medications.  Follow-up with your doctor.  Return to the emergency department if any worsening or concerning symptoms

## 2021-07-19 ENCOUNTER — Other Ambulatory Visit: Payer: Self-pay

## 2021-07-19 ENCOUNTER — Ambulatory Visit
Admission: RE | Admit: 2021-07-19 | Discharge: 2021-07-19 | Disposition: A | Discharge status: Home / Self Care | Payer: Medicaid Other | Source: Ambulatory Visit | Attending: Internal Medicine | Admitting: Internal Medicine

## 2021-07-19 DIAGNOSIS — Z87891 Personal history of nicotine dependence: Secondary | ICD-10-CM

## 2022-08-31 ENCOUNTER — Emergency Department (HOSPITAL_COMMUNITY)
Admission: EM | Admit: 2022-08-31 | Discharge: 2022-08-31 | Disposition: A | Discharge status: Home / Self Care | Payer: Medicaid Other | Attending: Emergency Medicine | Admitting: Emergency Medicine

## 2022-08-31 ENCOUNTER — Encounter (HOSPITAL_COMMUNITY): Payer: Self-pay | Admitting: *Deleted

## 2022-08-31 ENCOUNTER — Emergency Department (HOSPITAL_COMMUNITY): Payer: Medicaid Other

## 2022-08-31 ENCOUNTER — Other Ambulatory Visit: Payer: Self-pay

## 2022-08-31 DIAGNOSIS — Z7982 Long term (current) use of aspirin: Secondary | ICD-10-CM | POA: Insufficient documentation

## 2022-08-31 DIAGNOSIS — M25531 Pain in right wrist: Secondary | ICD-10-CM | POA: Insufficient documentation

## 2022-08-31 DIAGNOSIS — Z7984 Long term (current) use of oral hypoglycemic drugs: Secondary | ICD-10-CM | POA: Diagnosis not present

## 2022-08-31 DIAGNOSIS — M79641 Pain in right hand: Secondary | ICD-10-CM | POA: Diagnosis not present

## 2022-08-31 DIAGNOSIS — M542 Cervicalgia: Secondary | ICD-10-CM | POA: Diagnosis present

## 2022-08-31 DIAGNOSIS — I10 Essential (primary) hypertension: Secondary | ICD-10-CM

## 2022-08-31 DIAGNOSIS — Z79899 Other long term (current) drug therapy: Secondary | ICD-10-CM | POA: Diagnosis not present

## 2022-08-31 DIAGNOSIS — M25511 Pain in right shoulder: Secondary | ICD-10-CM | POA: Diagnosis not present

## 2022-08-31 MED ORDER — PREDNISONE 20 MG PO TABS: ORAL_TABLET | ORAL | 0 refills | Status: DC

## 2022-08-31 MED ORDER — OXYCODONE-ACETAMINOPHEN 5-325 MG PO TABS
1.0000 | ORAL_TABLET | Freq: Once | ORAL | Status: AC
  Administered 2022-08-31: 1 via ORAL
  Filled 2022-08-31: qty 1

## 2022-08-31 MED ORDER — ONDANSETRON 8 MG PO TBDP
8.0000 mg | ORAL_TABLET | Freq: Once | ORAL | Status: AC
  Administered 2022-08-31: 8 mg via ORAL
  Filled 2022-08-31: qty 1

## 2022-08-31 MED ORDER — TRAMADOL HCL 50 MG PO TABS: 50.0000 mg | ORAL_TABLET | Freq: Four times a day (QID) | ORAL | 0 refills | Status: DC | PRN

## 2022-08-31 NOTE — ED Provider Notes (Signed)
[facility] EMERGENCY DEPARTMENT Provider Note   CSN: [id number] Arrival date & time: 08/31/22  1417     History  Chief Complaint  Patient presents with   Mass    Raymond Barrera. is a 61 y.o. male.  Patient with several c/o over past 3-4 weeks. C/o mild swelling at area prior remote lipoma resection yrs ago (posterior lower neck), as well as radicular neck pain to right trapezius and right arm area. Occasional tingling sensation. Denies loss of motor fxn or new weakness. Also c/o pain to right wrist and hand in past few weeks - dull, moderate, worse w certain movements. No recent injury recalled. Denies hx gout or other inflammatory arthritis.  No fever or chills.   The history is provided by the patient, medical records, a relative and the spouse.       Home Medications Prior to Admission medications   Medication Sig Start Date End Date Taking? Authorizing Provider  amLODipine (NORVASC) 10 MG tablet Take 10 mg by mouth daily.  07/19/19   [provider]  aspirin 325 MG tablet Take 325 mg by mouth once.    [provider]  Dextromethorphan-quiNIDine (NUEDEXTA) 20-10 MG capsule Take 1 capsule by mouth 2 (two) times daily.    [provider]  gabapentin (NEURONTIN) 300 MG capsule Take 1 capsule (300 mg total) by mouth 3 (three) times daily. Patient taking differently: Take 300 mg by mouth at bedtime. 05/11/20   Meredith Pel, MD  glucose 4 GM chewable tablet Chew 1 tablet by mouth as needed for low blood sugar.    [provider]  labetalol (NORMODYNE) 100 MG tablet Take 100 mg by mouth 2 (two) times daily.    [provider]  lidocaine (LIDODERM) 5 % Place 1 patch onto the skin daily. Remove & Discard patch within 12 hours or as directed by MD Patient taking differently: Place 1 patch onto the skin daily as needed (PAIN). Remove & Discard patch within 12 hours or as directed by MD 07/10/19   Langston Masker B, PA-C  metFORMIN  (GLUCOPHAGE) 500 MG tablet Take 500 mg by mouth 2 (two) times daily with a meal.     [provider]  ondansetron (ZOFRAN) 4 MG tablet Take 1 tablet (4 mg total) by mouth every 8 (eight) hours as needed for nausea or vomiting. 03/30/19   Debbrah Alar, PA-C  oxyCODONE-acetaminophen (PERCOCET/ROXICET) 5-325 MG tablet Take 1 tablet by mouth every 6 (six) hours as needed for pain. 07/02/21   [provider]  tiZANidine (ZANAFLEX) 4 MG tablet Take 0.5-1 tablets (2-4 mg total) by mouth 2 (two) times daily as needed for muscle spasms. Patient not taking: No sig reported 05/11/20   Meredith Pel, MD      Allergies    Poison ivy extract [poison ivy extract]    Review of Systems   Review of Systems  Constitutional:  Negative for chills and fever.  HENT:  Negative for sore throat.   Eyes:  Negative for redness.  Respiratory:  Negative for cough and shortness of breath.   Cardiovascular:  Negative for chest pain and leg swelling.  Gastrointestinal:  Negative for abdominal pain, nausea and vomiting.  Genitourinary:  Negative for dysuria and flank pain.  Musculoskeletal:  Positive for arthralgias. Negative for back pain.  Skin:  Negative for rash.  Neurological:  Negative for weakness and headaches.  Hematological:  Does not bruise/bleed easily.  Psychiatric/Behavioral:  Negative for confusion.  Physical Exam Updated Vital Signs BP (!) 146/90 (BP Location: Right Arm)   Pulse 64   Temp 97.8 F (36.6 C) (Oral)   Resp 20   Ht 1.981 m ('6\' 6"'$ )   Wt 111.1 kg   SpO2 96%   BMI 28.31 kg/m  Physical Exam Vitals and nursing note reviewed.  Constitutional:      Appearance: Normal appearance. He is well-developed.  HENT:     Head: Atraumatic.     Nose: Nose normal.     Mouth/Throat:     Mouth: Mucous membranes are moist.     Pharynx: Oropharynx is clear.  Eyes:     General: No scleral icterus.    Conjunctiva/sclera: Conjunctivae normal.  Neck:     Vascular: No carotid  bruit.     Trachea: No tracheal deviation.     Comments: Trachea midline, no neck mass. Well healed surgical scar to base of posterior neck (prior lipoma resection reported) - no sign of infection or abscess to area. No neck stiffness or rigidity. Right trapezius muscular tenderness/?spasm.  Good passive rom bil shoulder, elbows, and wrists without pain. No gross RUE or hand swelling. Tenderness to dorsum right wrist and hand. Radial pulse 2+. Right hand and wrist of normal color and warmth. No pain w rom digits, no tenosynovitis.  Cardiovascular:     Rate and Rhythm: Normal rate and regular rhythm.     Pulses: Normal pulses.     Heart sounds: Normal heart sounds. No murmur heard.    No friction rub. No gallop.  Pulmonary:     Effort: Pulmonary effort is normal. No accessory muscle usage or respiratory distress.     Breath sounds: Normal breath sounds.  Abdominal:     General: Bowel sounds are normal. There is no distension.     Palpations: Abdomen is soft.     Tenderness: There is no abdominal tenderness. There is no guarding.  Genitourinary:    Comments: No cva tenderness. Musculoskeletal:        General: No swelling.     Cervical back: Normal range of motion and neck supple. No rigidity.     Comments: C/T spine non tender, aligned.   Skin:    General: Skin is warm and dry.     Findings: No rash.  Neurological:     Mental Status: He is alert.     Comments: Alert, speech clear. RUE nvi with intact motor/sens fxn.   Psychiatric:        Mood and Affect: Mood normal.     ED Results / Procedures / Treatments   Labs (all labs ordered are listed, but only abnormal results are displayed) Labs Reviewed - No data to display  EKG None  Radiology CT Cervical Spine Wo Contrast  Result Date: 08/31/2022 CLINICAL DATA:  Chronic neck pain. Degenerative changes on x-ray. History of degenerative disc disease. New radicular pain. Also history of prior resection of a lipoma from the base of  the neck. EXAM: CT CERVICAL SPINE WITHOUT CONTRAST TECHNIQUE: Multidetector CT imaging of the cervical spine was performed without intravenous contrast. Multiplanar CT image reconstructions were also generated. RADIATION DOSE REDUCTION: This exam was performed according to the departmental dose-optimization program which includes automated exposure control, adjustment of the mA and/or kV according to patient size and/or use of iterative reconstruction technique. COMPARISON:  Cervical spine radiographs 06/07/2020, MRI cervical spine 09/01/2020, CT cervical spine 06/24/2021 FINDINGS: Alignment: There is again straightening of the normal cervical lordosis. Minimal kyphotic  angulation centered at C3, unchanged. No sagittal spondylolisthesis. The atlantodens interval is intact. The facet joints are appropriately aligned. Skull base and vertebrae: Vertebral body heights are maintained. Moderate C3-4, mild C4-5, mild-to-moderate C5-6 and C6-7, moderate C7-T1, and mild-to-moderate T1-2 disc space narrowing with endplate sclerosis and anterior endplate osteophytes that are greatest at C4-5 through C6-7. No acute fracture is seen. Soft tissues and spinal canal: No prevertebral fluid or swelling. No visible canal hematoma. Disc levels: C2-3: Facet joint and uncovertebral hypertrophy contribute to mild right neuroforaminal stenosis. C3-4: Disc space narrowing, posterior endplate spurring, mild posterior disc bulge, and facet joint and uncovertebral hypertrophy contribute to severe left greater than right neuroforaminal stenosis. C4-5: Moderate bilateral facet joint hypertrophy. Moderate to large left and moderate right uncovertebral hypertrophy. Mild posterior disc osteophyte complex. Severe left-greater-than-right neuroforaminal stenosis. Mild left lateral recess stenosis. No central canal stenosis. C5-6: Mild-to-moderate bilateral facet joint hypertrophy. Moderate broad-based posterior disc osteophyte complex. Moderate to  high-grade bilateral uncovertebral hypertrophy. Severe bilateral neuroforaminal stenosis. No central canal stenosis. C6-7: Moderate bilateral facet joint hypertrophy. Moderate broad-based posterior disc osteophyte complex. Severe bilateral neuroforaminal stenosis. Mild narrowing of the lateral recesses and mild central canal stenosis. C7-T1: Severe left and moderate right facet joint hypertrophy. Mild broad-based posterior disc osteophyte complex. No significant central canal or neuroforaminal stenosis. T1-T2: Facet joint and uncovertebral hypertrophy contribute to moderate bilateral neuroforaminal stenosis. Upper chest: Mild biapical pleural scarring is similar to prior. Other: Mild atherosclerotic calcifications within the parietal carotid bulbs. IMPRESSION: 1. Multilevel degenerative disc and joint changes as above. 2. 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 3. Multilevel neuroforaminal stenosis as above, including severe left greater than right C3-4, severe left-greater-than-right C4-5, severe bilateral C5-6, and severe bilateral C6-7 neuroforaminal stenosis. Electronically Signed   By: Yvonne Kendall M.D.   On: 08/31/2022 16:44   DG Hand Complete Right  Result Date: 08/31/2022 CLINICAL DATA:  Pain. Swelling, redness, and discomfort. Lucency grip easily. EXAM: RIGHT WRIST - COMPLETE 3+ VIEW; RIGHT HAND - COMPLETE 3+ VIEW COMPARISON:  Right hand radiographs 06/08/2020 FINDINGS: Right wrist: Mild triscaphe and thumb carpometacarpal joint space narrowing. Mild ulnar positive variance. No acute fracture or dislocation. Right hand: Moderate third and mild second metacarpophalangeal joint space narrowing and peripheral osteophytosis. No acute fracture or dislocation. IMPRESSION: 1. 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 2. Mild triscaphe and thumb carpometacarpal osteoarthritis. Electronically Signed   By: Yvonne Kendall M.D.   On: 08/31/2022 15:55   DG Wrist Complete Right  Result Date:  08/31/2022 CLINICAL DATA:  Pain. Swelling, redness, and discomfort. Lucency grip easily. EXAM: RIGHT WRIST - COMPLETE 3+ VIEW; RIGHT HAND - COMPLETE 3+ VIEW COMPARISON:  Right hand radiographs 06/08/2020 FINDINGS: Right wrist: Mild triscaphe and thumb carpometacarpal joint space narrowing. Mild ulnar positive variance. No acute fracture or dislocation. Right hand: Moderate third and mild second metacarpophalangeal joint space narrowing and peripheral osteophytosis. No acute fracture or dislocation. IMPRESSION: 1. 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 2. Mild triscaphe and thumb carpometacarpal osteoarthritis. Electronically Signed   By: Yvonne Kendall M.D.   On: 08/31/2022 15:55    Procedures Procedures    Medications Ordered in ED Medications - No data to display  ED Course/ Medical Decision Making/ A&P                           Medical Decision Making Problems Addressed: Essential hypertension: chronic illness or injury with exacerbation, progression, or side effects of  treatment that poses a threat to life or bodily functions Neck pain: acute illness or injury with systemic symptoms Right hand pain: acute illness or injury Right wrist pain: acute illness or injury  Amount and/or Complexity of Data Reviewed Independent Historian: spouse    Details: hx External Data Reviewed: notes. Radiology: ordered and independent interpretation performed. Decision-making details documented in ED Course.  Risk Prescription drug management.   Imaging ordered.  Reviewed nursing notes and prior charts for additional history.   Additional hx from family.  Percocet po.   Xrays reviewed/interpreted by me - no fx + degenerative changes  CT reviewed/interpreted by me - no fx, +DDD.   Rec pcp f/u.  Return precautions provided.           Final Clinical Impression(s) / ED Diagnoses Final diagnoses:  None    Rx / DC Orders ED Discharge Orders     None          Lajean Saver, MD 08/31/22 1711

## 2022-08-31 NOTE — Discharge Instructions (Addendum)
It was our pleasure to provide your ER care today - we hope that you feel better.   Your xrays show degenerative/arthritic changes, and degenerative disc: 1. Multilevel degenerative disc and joint changes as above. 2. 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 3. Multi level neuroforaminal stenosis as above, including severe left greater than right C3-4, severe left-greater-than-right C4-5, severe bilateral C5-6, and severe bilateral C6-7 neuroforaminal stenosis.   Take prednisone as prescribed. Prednisone can elevated your blood sugars - so make sure to drink plenty of water/fluids and stay well hydrated, continue meds and monitor sugars.   You may also take ultram as need for pain - no driving when taking.   Follow up with primary care doctor, or orthopedist, in the next 1-2 weeks.  Also have your blood pressure rechecked then as it is high today.  Return to ER if worse, new symptoms, fevers, intractable pain, numbness/weakness, or other concern.

## 2022-08-31 NOTE — ED Triage Notes (Addendum)
BIB spouse/caregiver from home for lump noted to anterior L shoulder and posterior L trapezius, TTP, and painful. Ongoing for 1-2 weeks. Swelling has fluctuated. Mentions fever 100. Afebrile at this time. Spouse is primary communicator and caregiver since CVA.

## 2022-09-26 ENCOUNTER — Encounter: Payer: Self-pay | Admitting: Neurology

## 2023-01-13 ENCOUNTER — Encounter (HOSPITAL_COMMUNITY): Payer: Self-pay

## 2023-01-13 ENCOUNTER — Observation Stay (HOSPITAL_COMMUNITY)
Admission: EM | Admit: 2023-01-13 | Discharge: 2023-01-15 | Disposition: A | Discharge status: Home / Self Care | Payer: Medicaid Other | Attending: Internal Medicine | Admitting: Internal Medicine

## 2023-01-13 ENCOUNTER — Emergency Department (HOSPITAL_COMMUNITY): Payer: Medicaid Other

## 2023-01-13 ENCOUNTER — Emergency Department (HOSPITAL_BASED_OUTPATIENT_CLINIC_OR_DEPARTMENT_OTHER): Payer: Medicaid Other

## 2023-01-13 ENCOUNTER — Other Ambulatory Visit: Payer: Self-pay

## 2023-01-13 DIAGNOSIS — G459 Transient cerebral ischemic attack, unspecified: Principal | ICD-10-CM | POA: Diagnosis present

## 2023-01-13 DIAGNOSIS — Z96643 Presence of artificial hip joint, bilateral: Secondary | ICD-10-CM | POA: Diagnosis not present

## 2023-01-13 DIAGNOSIS — F1410 Cocaine abuse, uncomplicated: Secondary | ICD-10-CM | POA: Diagnosis present

## 2023-01-13 DIAGNOSIS — I6930 Unspecified sequelae of cerebral infarction: Secondary | ICD-10-CM | POA: Diagnosis not present

## 2023-01-13 DIAGNOSIS — N1832 Chronic kidney disease, stage 3b: Secondary | ICD-10-CM | POA: Diagnosis not present

## 2023-01-13 DIAGNOSIS — Z79899 Other long term (current) drug therapy: Secondary | ICD-10-CM | POA: Diagnosis not present

## 2023-01-13 DIAGNOSIS — R269 Unspecified abnormalities of gait and mobility: Secondary | ICD-10-CM

## 2023-01-13 DIAGNOSIS — F17200 Nicotine dependence, unspecified, uncomplicated: Secondary | ICD-10-CM | POA: Diagnosis present

## 2023-01-13 DIAGNOSIS — E0800 Diabetes mellitus due to underlying condition with hyperosmolarity without nonketotic hyperglycemic-hyperosmolar coma (NKHHC): Secondary | ICD-10-CM

## 2023-01-13 DIAGNOSIS — R2689 Other abnormalities of gait and mobility: Secondary | ICD-10-CM | POA: Diagnosis not present

## 2023-01-13 DIAGNOSIS — E1122 Type 2 diabetes mellitus with diabetic chronic kidney disease: Secondary | ICD-10-CM | POA: Insufficient documentation

## 2023-01-13 DIAGNOSIS — I130 Hypertensive heart and chronic kidney disease with heart failure and stage 1 through stage 4 chronic kidney disease, or unspecified chronic kidney disease: Secondary | ICD-10-CM | POA: Diagnosis not present

## 2023-01-13 DIAGNOSIS — D649 Anemia, unspecified: Secondary | ICD-10-CM | POA: Diagnosis present

## 2023-01-13 DIAGNOSIS — E785 Hyperlipidemia, unspecified: Secondary | ICD-10-CM | POA: Diagnosis present

## 2023-01-13 DIAGNOSIS — D539 Nutritional anemia, unspecified: Secondary | ICD-10-CM | POA: Diagnosis not present

## 2023-01-13 DIAGNOSIS — R531 Weakness: Secondary | ICD-10-CM | POA: Diagnosis present

## 2023-01-13 DIAGNOSIS — F17210 Nicotine dependence, cigarettes, uncomplicated: Secondary | ICD-10-CM | POA: Insufficient documentation

## 2023-01-13 DIAGNOSIS — E1169 Type 2 diabetes mellitus with other specified complication: Secondary | ICD-10-CM

## 2023-01-13 DIAGNOSIS — I5020 Unspecified systolic (congestive) heart failure: Secondary | ICD-10-CM | POA: Diagnosis not present

## 2023-01-13 DIAGNOSIS — Z8673 Personal history of transient ischemic attack (TIA), and cerebral infarction without residual deficits: Secondary | ICD-10-CM

## 2023-01-13 DIAGNOSIS — Z1152 Encounter for screening for COVID-19: Secondary | ICD-10-CM | POA: Diagnosis not present

## 2023-01-13 DIAGNOSIS — I10 Essential (primary) hypertension: Secondary | ICD-10-CM | POA: Diagnosis present

## 2023-01-13 DIAGNOSIS — N183 Chronic kidney disease, stage 3 unspecified: Secondary | ICD-10-CM | POA: Diagnosis present

## 2023-01-13 DIAGNOSIS — I2510 Atherosclerotic heart disease of native coronary artery without angina pectoris: Secondary | ICD-10-CM | POA: Diagnosis not present

## 2023-01-13 DIAGNOSIS — E119 Type 2 diabetes mellitus without complications: Secondary | ICD-10-CM

## 2023-01-13 DIAGNOSIS — I5022 Chronic systolic (congestive) heart failure: Secondary | ICD-10-CM

## 2023-01-13 DIAGNOSIS — N189 Chronic kidney disease, unspecified: Secondary | ICD-10-CM | POA: Diagnosis present

## 2023-01-13 LAB — RAPID URINE DRUG SCREEN, HOSP PERFORMED
Amphetamines: NOT DETECTED
Barbiturates: NOT DETECTED
Benzodiazepines: NOT DETECTED
Cocaine: POSITIVE — AB
Opiates: NOT DETECTED
Tetrahydrocannabinol: NOT DETECTED

## 2023-01-13 LAB — ECHOCARDIOGRAM COMPLETE
AR max vel: 2.63 cm2
AV Area VTI: 2.31 cm2
AV Area mean vel: 2.21 cm2
AV Mean grad: 4 mmHg
AV Peak grad: 7.3 mmHg
Ao pk vel: 1.35 m/s
Area-P 1/2: 2.32 cm2
Calc EF: 34 %
Height: 78 in
S' Lateral: 4.2 cm
Single Plane A2C EF: 35.9 %
Single Plane A4C EF: 33.1 %
Weight: 4028.25 oz

## 2023-01-13 LAB — I-STAT CHEM 8, ED
BUN: 26 mg/dL — ABNORMAL HIGH (ref 8–23)
Calcium, Ion: 1.4 mmol/L (ref 1.15–1.40)
Chloride: 104 mmol/L (ref 98–111)
Creatinine, Ser: 2 mg/dL — ABNORMAL HIGH (ref 0.61–1.24)
Glucose, Bld: 123 mg/dL — ABNORMAL HIGH (ref 70–99)
HCT: 36 % — ABNORMAL LOW (ref 39.0–52.0)
Hemoglobin: 12.2 g/dL — ABNORMAL LOW (ref 13.0–17.0)
Potassium: 4.1 mmol/L (ref 3.5–5.1)
Sodium: 140 mmol/L (ref 135–145)
TCO2: 25 mmol/L (ref 22–32)

## 2023-01-13 LAB — DIFFERENTIAL
Abs Immature Granulocytes: 0.02 10*3/uL (ref 0.00–0.07)
Basophils Absolute: 0 10*3/uL (ref 0.0–0.1)
Basophils Relative: 0 %
Eosinophils Absolute: 0.1 10*3/uL (ref 0.0–0.5)
Eosinophils Relative: 1 %
Immature Granulocytes: 0 %
Lymphocytes Relative: 21 %
Lymphs Abs: 1.3 10*3/uL (ref 0.7–4.0)
Monocytes Absolute: 0.4 10*3/uL (ref 0.1–1.0)
Monocytes Relative: 6 %
Neutro Abs: 4.6 10*3/uL (ref 1.7–7.7)
Neutrophils Relative %: 72 %

## 2023-01-13 LAB — GLUCOSE, CAPILLARY
Glucose-Capillary: 87 mg/dL (ref 70–99)
Glucose-Capillary: 107 mg/dL — ABNORMAL HIGH (ref 70–99)

## 2023-01-13 LAB — COMPREHENSIVE METABOLIC PANEL
ALT: 15 U/L (ref 0–44)
AST: 17 U/L (ref 15–41)
Albumin: 3.6 g/dL (ref 3.5–5.0)
Alkaline Phosphatase: 82 U/L (ref 38–126)
Anion gap: 9 (ref 5–15)
BUN: 25 mg/dL — ABNORMAL HIGH (ref 8–23)
CO2: 25 mmol/L (ref 22–32)
Calcium: 10.6 mg/dL — ABNORMAL HIGH (ref 8.9–10.3)
Chloride: 102 mmol/L (ref 98–111)
Creatinine, Ser: 1.86 mg/dL — ABNORMAL HIGH (ref 0.61–1.24)
GFR, Estimated: 40 mL/min — ABNORMAL LOW (ref >60)
Glucose, Bld: 124 mg/dL — ABNORMAL HIGH (ref 70–99)
Potassium: 4 mmol/L (ref 3.5–5.1)
Sodium: 136 mmol/L (ref 135–145)
Total Bilirubin: 0.2 mg/dL — ABNORMAL LOW (ref 0.3–1.2)
Total Protein: 6.7 g/dL (ref 6.5–8.1)

## 2023-01-13 LAB — CBC
HCT: 36.6 % — ABNORMAL LOW (ref 39.0–52.0)
Hemoglobin: 11.7 g/dL — ABNORMAL LOW (ref 13.0–17.0)
MCH: 27.5 pg (ref 26.0–34.0)
MCHC: 32 g/dL (ref 30.0–36.0)
MCV: 85.9 fL (ref 80.0–100.0)
Platelets: 245 10*3/uL (ref 150–400)
RBC: 4.26 MIL/uL (ref 4.22–5.81)
RDW: 15.3 % (ref 11.5–15.5)
WBC: 6.5 10*3/uL (ref 4.0–10.5)
nRBC: 0 % (ref 0.0–0.2)

## 2023-01-13 LAB — RESP PANEL BY RT-PCR (RSV, FLU A&B, COVID)  RVPGX2
Influenza A by PCR: NEGATIVE
Influenza B by PCR: NEGATIVE
Resp Syncytial Virus by PCR: NEGATIVE
SARS Coronavirus 2 by RT PCR: NEGATIVE

## 2023-01-13 LAB — PROTIME-INR
INR: 1 (ref 0.8–1.2)
Prothrombin Time: 13.2 seconds (ref 11.4–15.2)

## 2023-01-13 LAB — CBG MONITORING, ED: Glucose-Capillary: 122 mg/dL — ABNORMAL HIGH (ref 70–99)

## 2023-01-13 LAB — APTT: aPTT: 30 seconds (ref 24–36)

## 2023-01-13 LAB — ETHANOL: Alcohol, Ethyl (B): <10 mg/dL (ref <10)

## 2023-01-13 LAB — HEMOGLOBIN A1C
Hgb A1c MFr Bld: 6.4 % — ABNORMAL HIGH (ref 4.8–5.6)
Mean Plasma Glucose: 136.98 mg/dL

## 2023-01-13 MED ORDER — GABAPENTIN 100 MG PO CAPS
100.0000 mg | ORAL_CAPSULE | Freq: Two times a day (BID) | ORAL | Status: DC
  Administered 2023-01-13 – 2023-01-15 (×4): 100 mg via ORAL
  Filled 2023-01-13 (×4): qty 1

## 2023-01-13 MED ORDER — TIZANIDINE HCL 4 MG PO TABS
2.0000 mg | ORAL_TABLET | Freq: Two times a day (BID) | ORAL | Status: DC | PRN
  Administered 2023-01-14 – 2023-01-15 (×2): 4 mg via ORAL
  Filled 2023-01-13 (×2): qty 1

## 2023-01-13 MED ORDER — SODIUM CHLORIDE 0.9% FLUSH: 3.0000 mL | Freq: Once | INTRAVENOUS | Status: DC

## 2023-01-13 MED ORDER — SODIUM CHLORIDE 0.9 % IV SOLN: INTRAVENOUS | Status: DC

## 2023-01-13 MED ORDER — NICOTINE 7 MG/24HR TD PT24
7.0000 mg | MEDICATED_PATCH | Freq: Every day | TRANSDERMAL | Status: DC
  Administered 2023-01-13 – 2023-01-15 (×3): 7 mg via TRANSDERMAL
  Filled 2023-01-13 (×4): qty 1

## 2023-01-13 MED ORDER — SODIUM CHLORIDE 0.9 % IV SOLN: Freq: Once | INTRAVENOUS | Status: AC

## 2023-01-13 MED ORDER — STROKE: EARLY STAGES OF RECOVERY BOOK
Freq: Once | Status: AC
  Filled 2023-01-13: qty 1

## 2023-01-13 MED ORDER — SODIUM CHLORIDE 0.9 % IV BOLUS
1000.0000 mL | Freq: Once | INTRAVENOUS | Status: AC
  Administered 2023-01-13: 1000 mL via INTRAVENOUS

## 2023-01-13 MED ORDER — HYDRALAZINE HCL 20 MG/ML IJ SOLN: 10.0000 mg | INTRAMUSCULAR | Status: DC | PRN

## 2023-01-13 MED ORDER — EMPAGLIFLOZIN 10 MG PO TABS
10.0000 mg | ORAL_TABLET | Freq: Every day | ORAL | Status: DC
  Administered 2023-01-14 – 2023-01-15 (×2): 10 mg via ORAL
  Filled 2023-01-13 (×2): qty 1

## 2023-01-13 MED ORDER — ASPIRIN 81 MG PO CHEW
81.0000 mg | CHEWABLE_TABLET | Freq: Every day | ORAL | Status: DC
  Administered 2023-01-14 – 2023-01-15 (×2): 81 mg via ORAL
  Filled 2023-01-13 (×2): qty 1

## 2023-01-13 MED ORDER — RAMIPRIL 5 MG PO CAPS
10.0000 mg | ORAL_CAPSULE | Freq: Every day | ORAL | Status: DC
  Administered 2023-01-14 – 2023-01-15 (×2): 10 mg via ORAL
  Filled 2023-01-13 (×2): qty 2

## 2023-01-13 MED ORDER — CLOPIDOGREL BISULFATE 75 MG PO TABS
75.0000 mg | ORAL_TABLET | Freq: Every day | ORAL | Status: DC
  Administered 2023-01-13 – 2023-01-15 (×3): 75 mg via ORAL
  Filled 2023-01-13 (×3): qty 1

## 2023-01-13 NOTE — ED Notes (Signed)
Got patient into a gown on the monitor did EKG shown to Dr Vanita Panda patient is resting with call bell in reach

## 2023-01-13 NOTE — H&P (Addendum)
History and Physical    Patient: Raymond Barrera. [id number] DOB: 11/28/1961 DOA: 01/13/2023 DOS: the patient was seen and examined on 01/13/2023 PCP: Dulce Sellar, MD  Patient coming from: PCP office via EMS  Chief Complaint: Right-sided weakness and slurred speech  HPI: Raymond Barrera. is a 62 y.o. 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  At baseline patient ambulates with use of a cane as he has an unsteady gait.  He had been with his wife this morning at her doctor's appointment when he started to feel unwell.  His wife notes that he complained of feeling dizzy and kept repeating "I can't, I can't".  She reported that he appeared to be having similar symptoms when he had initially had a stroke last year.  When she tried asked him what was wrong he said "I do not know".  She noticed that the right side of his face was drooping and he was unable to talk like he previously was able to.  He denied having any chest pain, palpitation, nausea, vomiting, diarrhea, cough.  Patient did admit to using cocaine approximately 2 days ago and still smokes approximately 4-5 cigarettes/day on average.  His wife notes that he started physical therapy yesterday due to issues with his back and hips which makes also difficult for him to get around.  He feels that symptoms are better but he does not feel completely back to his norm.  Patient presented as a code stroke and was evaluated by neurology.  Blood pressure was elevated up to 168/113, and all other vital signs maintained.  CT scan of the brain did not note any acute abnormality.  Labs noted hemoglobin 11.7, BUN 25, creatinine 1.86, and calcium 10.6.  He had not been a candidate for tPA due to improvement in symptoms.  Patient was  given 1 L normal saline IV fluids.  Review of Systems: As mentioned in the history of present illness. All other systems reviewed and are negative. Past Medical History:  Diagnosis Date   Ambulates with cane    straight - uses occasionally   Anxiety    due to the stroke   Arthritis    Back pain    hx of buldging disc   Complication of anesthesia    took a while for him to wake up after previous anesthesia   Diabetes mellitus without complication ([place])    Family history of adverse reaction to anesthesia    "sometimes mom has a hard time waking up"   High cholesterol    takes Zocor daily   Hypertension    takes Benazepril and HCTZ  daily   Joint pain    Joint swelling    Memory impairment    occassional - from stroke   Myocardial infarction ([place]) 1987   Pneumonia    hx of-80's   Shortness of breath dyspnea    do to pain   Sleep apnea    never had a sleep study,but states Dr. Cindie Laroche says he has it   Slurred speech    Stroke ([place]) 08/2013   7 mini-strokes, last stroke 2017   TIA (transient ischemic attack) 2014   x 7    Urinary frequency    Past Surgical History:  Procedure Laterality Date   ABDOMINAL EXPOSURE N/A 06/16/2018  Procedure: ABDOMINAL EXPOSURE;  Surgeon: Rosetta Posner, MD;  Location: [place];  Service: Vascular;  Laterality: N/A;   ANKLE SURGERY  2008   left ankle-otif-[facility]   ANTERIOR LUMBAR FUSION Bilateral 06/16/2018   Procedure: LUMBAR 4-5 LUMBAR 5-SACRUM 1 ANTERIOR LUMBAR INTERBODY FUSION WITH INSTRUMENTATION AND ALLOGRAFT;  Surgeon: Phylliss Bob, MD;  Location: [facility];  Service: Orthopedics;  Laterality: Bilateral;   BACK SURGERY     HEMATOMA EVACUATION Left 08/13/2018   Procedure: EVACUATION HEMATOMA LEFT ABDOMINAL WALL;  Surgeon: Rosetta Posner, MD;  Location[facility];  Service: Vascular;  Laterality: Left;   IR RADIOLOGIST EVAL & MGMT  07/27/2018   IR US GUIDE BX ASP/DRAIN  07/14/2018   JOINT REPLACEMENT     both hips replaced    LUMBAR  LAMINECTOMY/DECOMPRESSION MICRODISCECTOMY Right 10/17/2014   Procedure: LUMBAR LAMINECTOMY/DECOMPRESSION MICRODISCECTOMY 2 LEVELS;  Surgeon: [name], MD;  Location: [facility];  Service: Neurosurgery;  Laterality: Right;  Right L45 L5S1 laminectomy and foraminotomy   MASS EXCISION  09/13/2012   Procedure: EXCISION MASS;  Surgeon: Jamesetta So, MD;  Location: [facility];  Service: General;  Laterality: N/A;   ROBOTIC ASSITED PARTIAL NEPHRECTOMY Left 03/30/2019   Procedure: XI ROBOTIC ASSITED PARTIAL NEPHRECTOMY POSSIBLE RADICAL NEPHRECTOMY;  Surgeon: Ceasar Mons, MD;  Location: [facility];  Service: Urology;  Laterality: Left;   SHOULDER ARTHROSCOPY WITH ROTATOR CUFF REPAIR Right 04/12/2020   Procedure: right shoulder arthroscopy, debridement, mini open rotator cuff tear repair;  Surgeon: Meredith Pel, MD;  Location: [facility];  Service: Orthopedics;  Laterality: Right;   TONSILLECTOMY     TOTAL HIP ARTHROPLASTY Right 03/20/2015   TOTAL HIP ARTHROPLASTY Right 03/20/2015   Procedure: TOTAL HIP ARTHROPLASTY ANTERIOR APPROACH;  Surgeon: Renette Butters, MD;  Location: [place];  Service: Orthopedics;  Laterality: Right;   TOTAL HIP ARTHROPLASTY Left 01/08/2016   Procedure: TOTAL HIP ARTHROPLASTY ANTERIOR APPROACH;  Surgeon: Renette Butters, MD;  Location: [facility];  Service: Orthopedics;  Laterality: Left;   Social History:  reports that he has been smoking cigarettes. He has a 11.75 pack-year smoking history. He has never used smokeless tobacco. He reports that he does not currently use alcohol. He reports that he does not currently use drugs after having used the following drugs: Cocaine.  Allergies  Allergen Reactions   Poison Ivy Extract [Poison Ivy Extract] Itching and Rash    Family History  Problem Relation Age of Onset   Heart disease Father     Prior to Admission medications   Medication Sig Start Date End Date Taking? Authorizing Provider  amLODipine (NORVASC) 10 MG  tablet Take 10 mg by mouth daily.  07/19/19   [provider]  aspirin 325 MG tablet Take 325 mg by mouth once.    [provider]  Dextromethorphan-quiNIDine (NUEDEXTA) 20-10 MG capsule Take 1 capsule by mouth 2 (two) times daily.    [provider]  gabapentin (NEURONTIN) 300 MG capsule Take 1 capsule (300 mg total) by mouth 3 (three) times daily. Patient taking differently: Take 300 mg by mouth at bedtime. 05/11/20   Meredith Pel, MD  glucose 4 GM chewable tablet Chew 1 tablet by mouth as needed for low blood sugar.    [provider]  labetalol (NORMODYNE) 100 MG tablet Take 100 mg by mouth 2 (two) times daily.    [provider]  lidocaine (LIDODERM) 5 % Place 1 patch onto the skin daily. Remove & Discard  patch within 12 hours or as directed by MD Patient taking differently: Place 1 patch onto the skin daily as needed (PAIN). Remove & Discard patch within 12 hours or as directed by MD 07/10/19   Langston Masker B, PA-C  metFORMIN (GLUCOPHAGE) 500 MG tablet Take 500 mg by mouth 2 (two) times daily with a meal.     [provider]  ondansetron (ZOFRAN) 4 MG tablet Take 1 tablet (4 mg total) by mouth every 8 (eight) hours as needed for nausea or vomiting. 03/30/19   Debbrah Alar, PA-C  oxyCODONE-acetaminophen (PERCOCET/ROXICET) 5-325 MG tablet Take 1 tablet by mouth every 6 (six) hours as needed for pain. 07/02/21   [provider]  predniSONE (DELTASONE) 20 MG tablet 2 po once a day for 3 days, then 1 po once a day for 3 days 08/31/22   Lajean Saver, MD  tiZANidine (ZANAFLEX) 4 MG tablet Take 0.5-1 tablets (2-4 mg total) by mouth 2 (two) times daily as needed for muscle spasms. Patient not taking: No sig reported 05/11/20   Meredith Pel, MD  traMADol (ULTRAM) 50 MG tablet Take 1 tablet (50 mg total) by mouth every 6 (six) hours as needed. 08/31/22   Lajean Saver, MD    Physical Exam: Vitals:   01/13/23 1245 01/13/23 1246  01/13/23 1315 01/13/23 1421  BP: (!) 155/101  (!) 162/92 (!) 168/113  Pulse: 71  71 68  Resp: 19  11 (!) 21  Temp:  97.6 F (36.4 C)    TempSrc:  Oral    SpO2: 99%  99% 100%  Weight:      Height:        Constitutional: Older adult male who appears to be in no acute distress at this time and able to follow commands Eyes: PERRL, lids and conjunctivae normal ENMT: Mucous membranes are moist.  Fair dentition and presence of slight facial droop on the right. Neck: normal, supple,  Respiratory: clear to auscultation bilaterally, no wheezing, no crackles.   Cardiovascular: Regular rate and rhythm, no murmurs / rubs / gallops. No extremity edema.    Abdomen: no tenderness, no masses palpated. Bowel sounds positive.  Musculoskeletal: no clubbing / cyanosis.  Decreased range of motion lower extremities notably in the right leg more so than the left leg due to pain. Skin: no rashes, lesions, ulcers. No induration Neurologic: CN 2-12 grossly intact.  Slurred speech strength 3/5 in the left lower extremity possibly affected secondary to pain.  Strength 4/5 in the left upper extremity.  Strength 4/5 in the right upper and lower extremity.   Psychiatric: Normal judgment and insight. Alert and oriented x 3. Normal mood.   Data Reviewed:  Reviewed labs, imaging, and pertinent records as noted above in HPI.  Assessment and Plan: Transient ischemic attack History of CVA with residual deficit Acute.  Patient present with worsening right-sided weakness and expressive aphasia while at his wife's doctor's appointment.  His wife noted previous symptoms with prior stroke back in 2021.  Today CT of the head did not note any acute abnormality.  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  -Admit to a telemetry bed -Neurochecks -Check lipid panel -Follow-up echocardiogram -PT/OT/speech to evaluate and treat -Aspirin and Plavix x 3 weeks, then either aspirin or Plavix as monotherapy  per neurology recommendations -[facility] consultative services, we will follow-up for any further recommendations.  Essential hypertension Blood pressures are elevated up to 168/113.  Patient had already taken home blood pressure medications of ramipril 10 mg for today.  Unclear if patient's elevated blood pressures related to TIA and/or recent use of cocaine -Initially recommended for permissive hypertension -Continue ramipril in a.m.  Controlled diabetes mellitus type 2, without long-term use of insulin On admission blood sugar was 124.  Hemoglobin A1c was 6.4.   -Continue Jardiance  Normocytic anemia Chronic.  Hemoglobin 11.7 g/dL which appears to be stable -Continue to monitor  Gait disturbance Patient ambulates with the use of a cane due to issues with balance at baseline. -PT OT to eval and treat  Chronic kidney disease stage IIIb Creatinine 1.86 which appears similar to care everywhere records from 10/2021. -Continue to monitor kidney function  Hyperlipidemia Last lipid panel noted total cholesterol 164, HDL 32, LDL 89, triglycerides 213 in 10/2020 -Follow-up lipid panel and determine need to start patient on statin  Cocaine abuse  UDS positive for cocaine.Patient admitted to recently using cocaine last couple of days.  -Counseled on need of cessation of cocaine use  Tobacco use States that he still smokes 5 cigarettes/day on average. -Counseled on need of cessation of tobacco use  DVT prophylaxis: Lovenox Advance Care Planning:   Code Status: Prior    Consults: Neurology  Family Communication: Wife updated at bedside  Severity of Illness: The appropriate patient status for this patient is OBSERVATION. Observation status is judged to be  reasonable and necessary in order to provide the required intensity of service to ensure the patient's safety. The patient's presenting symptoms, physical exam findings, and initial radiographic and laboratory data in the context of their medical condition is felt to place them at decreased risk for further clinical deterioration. Furthermore, it is anticipated that the patient will be medically stable for discharge from the hospital within 2 midnights of admission.   Author: Norval Morton, MD 01/13/2023 2:51 PM  For on call review [email or web address].

## 2023-01-13 NOTE — ED Notes (Signed)
Family at bedside. 

## 2023-01-13 NOTE — Plan of Care (Signed)
  Problem: Education: Goal: Knowledge of disease or condition will improve Outcome: Progressing   Problem: Ischemic Stroke/TIA Tissue Perfusion: Goal: Complications of ischemic stroke/TIA will be minimized Outcome: Progressing   Problem: Coping: Goal: Will identify appropriate support needs Outcome: Progressing   Problem: Health Behavior/Discharge Planning: Goal: Ability to manage health-related needs will improve Outcome: Progressing   Problem: Self-Care: Goal: Ability to participate in self-care as condition permits will improve Outcome: Progressing Goal: Ability to communicate needs accurately will improve Outcome: Progressing   Problem: Nutrition: Goal: Risk of aspiration will decrease Outcome: Progressing Goal: Dietary intake will improve Outcome: Progressing

## 2023-01-13 NOTE — ED Notes (Signed)
Patient transported to MRI 

## 2023-01-13 NOTE — Evaluation (Signed)
Physical Therapy Evaluation Patient Details Name: Raymond Barrera. MRN: [id number] DOB: 05/09/1961 Today's Date: 01/13/2023  History of Present Illness  62 y.o. male presents to [facility] hospital on 01/13/2023 after experiencing dizziness and aphasia. Pt reports resolution of symptoms after imaging. MRI negative for acute abnormality. PMH includes DM, HLD, HTN, MI, OSA, CVA.  Clinical Impression  Pt presents to PT with deficits in functional mobility, gait, balance, power, endurance, pain. Pt has chronic LLE weakness and dysarthria from prior CVA, but today reports being limited by lightheadedness as well as significant L hip pain when ambulating. Pt started seeing outpatient PT yesterday for management of low back pain, he does not report if his low back pain radiates to his LLE typically during this eval. Pt initially ambulates with reduced stance time on LLE (likely chronic due to LLE weakness), but then becomes emotional and requires BUE support to return to room due to pain. PT will continue to follow in an effort to improve activity tolerance and stability. PT is hopeful the pt will progress well and be able to return to outpatient PT for further management of his low back pain, as well as L hip pain.       Recommendations for follow up therapy are one component of a multi-disciplinary discharge planning process, led by the attending physician.  Recommendations may be updated based on patient status, additional functional criteria and insurance authorization.  Follow Up Recommendations Outpatient PT (continue outpatient PT for low back pain management)      Assistance Recommended at Discharge Intermittent Supervision/Assistance  Patient can return home with the following  A little help with walking and/or transfers;A little help with bathing/dressing/bathroom;Assistance with cooking/housework;Assist for transportation;Help with stairs or ramp for entrance;Direct supervision/assist for medications  management;Direct supervision/assist for financial management    Equipment Recommendations None recommended by PT  Recommendations for Other Services       Functional Status Assessment Patient has had a recent decline in their functional status and demonstrates the ability to make significant improvements in function in a reasonable and predictable amount of time.     Precautions / Restrictions Precautions Precautions: Fall Precaution Comments: chronic LLE weakness and pain, chronic dysarthria Restrictions Weight Bearing Restrictions: No      Mobility  Bed Mobility Overal bed mobility: Needs Assistance Bed Mobility: Supine to Sit, Sit to Supine     Supine to sit: Modified independent (Device/Increase time) Sit to supine: Modified independent (Device/Increase time)        Transfers Overall transfer level: Needs assistance Equipment used: None Transfers: Sit to/from Stand Sit to Stand: Min guard                Ambulation/Gait Ambulation/Gait assistance: Herbalist (Feet): 30 Feet Assistive device: None, 1 person hand held assist, IV Pole (initially without DME, then with IV and HHA 2/2 hip pain and pt becoming emotional) Gait Pattern/deviations: Step-to pattern, Decreased stance time - left Gait velocity: reduced Gait velocity interpretation: <1.31 ft/sec, indicative of household ambulator   General Gait Details: pt initially ambulates with reduced stance time on LLE, after ~15' pt becomes emotional, grabs onto wall for support, reports he cannot walk anymore. Initially the pt is unable to report what symptoms are causing his emotions or need to stop mobilizing. Pt ambulates back to stretcher with minA and support of IV pole and hand hold. Later pt reports L hip pain resulted in his emotions and need to stop ambulation  Stairs  Wheelchair Mobility    Modified Rankin (Stroke Patients Only) Modified Rankin (Stroke Patients  Only) Pre-Morbid Rankin Score: Slight disability Modified Rankin: Moderately severe disability     Balance Overall balance assessment: Needs assistance Sitting-balance support: No upper extremity supported, Feet supported Sitting balance-Leahy Scale: Good     Standing balance support: Single extremity supported, Reliant on assistive device for balance Standing balance-Leahy Scale: Poor                               Pertinent Vitals/Pain Pain Assessment Pain Assessment: Faces Faces Pain Scale: Hurts whole lot Pain Location: L hip Pain Descriptors / Indicators: Aching Pain Intervention(s): Monitored during session    Home Living Family/patient expects to be discharged to:: Private residence Living Arrangements: Spouse/significant other;Parent Available Help at Discharge: Family;Available PRN/intermittently Type of Home: House Home Access: Level entry     Alternate Level Stairs-Number of Steps: 13 Home Layout: Multi-level (pt and his spouse live in the basement, mother lives upstairs) Home Equipment: Conservation officer, nature (2 wheels);Cane - single point;Crutches      Prior Function Prior Level of Function : Independent/Modified Independent;Driving             Mobility Comments: retired, PRN use of SPC ADLs Comments: assists some with cleaning, spouse and mom do 37 of cooking     Hand Dominance   Dominant Hand: Right    Extremity/Trunk Assessment   Upper Extremity Assessment Upper Extremity Assessment: Overall WFL for tasks assessed    Lower Extremity Assessment Lower Extremity Assessment: LLE deficits/detail LLE Deficits / Details: chronic LLE weakness from prior CVA, pt with L hip pain which is also chronic and limits mobility at this time. ROM WFL, strength grossly 4-/5    Cervical / Trunk Assessment Cervical / Trunk Assessment: Normal  Communication   Communication: Expressive difficulties (dysarthric at baseline, appears to be at baseline)   Cognition Arousal/Alertness: Awake/alert Behavior During Therapy:  (labile) Overall Cognitive Status: Impaired/Different from baseline Area of Impairment: Problem solving                             Problem Solving: Slow processing          General Comments General comments (skin integrity, edema, etc.): VSS on RA, BP in 160s/100s throughout session. Pt does report intertittent lightheadeness in sitting, does not seem to be associated with positional changes as pt also reports this at the end of session after sitting for a few minutes.    Exercises     Assessment/Plan    PT Assessment Patient needs continued PT services  PT Problem List Decreased activity tolerance;Decreased balance;Decreased mobility;Decreased knowledge of use of DME;Pain;Decreased strength       PT Treatment Interventions DME instruction;Gait training;Stair training;Functional mobility training;Therapeutic exercise;Therapeutic activities;Balance training;Neuromuscular re-education;Patient/family education    PT Goals (Current goals can be found in the Care Plan section)  Acute Rehab PT Goals Patient Stated Goal: to reduce pain in left hip PT Goal Formulation: With patient Time For Goal Achievement: 01/27/23 Potential to Achieve Goals: Fair    Frequency Min 3X/week     Co-evaluation               AM-PAC PT "6 Clicks" Mobility  Outcome Measure Help needed turning from your back to your side while in a flat bed without using bedrails?: None Help needed moving from lying on your back to  sitting on the side of a flat bed without using bedrails?: None Help needed moving to and from a bed to a chair (including a wheelchair)?: A Little Help needed standing up from a chair using your arms (e.g., wheelchair or bedside chair)?: A Little Help needed to walk in hospital room?: A Little Help needed climbing 3-5 steps with a railing? : A Lot 6 Click Score: 19    End of Session   Activity  Tolerance: Patient limited by pain Patient left: in bed;with call bell/phone within reach;with family/visitor present Nurse Communication: Mobility status PT Visit Diagnosis: Other abnormalities of gait and mobility (R26.89);Pain (chronic LLE weakness) Pain - Right/Left: Left Pain - part of body: Hip    Time: [id number] PT Time Calculation (min) (ACUTE ONLY): 25 min   Charges:   PT Evaluation $PT Eval Low Complexity: [place], PT, DPT Acute Rehabilitation Office [phone]   Zenaida Niece 01/13/2023, 4:55 PM

## 2023-01-13 NOTE — ED Triage Notes (Signed)
Pt bib ems from a [facility] as a code stroke; pt woke "not feeling well", worsening stroke symptoms began at 1115 this am; hx prior stroke, r sided weakness, mild aphasia at baseline; both worsened today, along with dysarthria and r sided facial droop; pt unable to lift R arm initially with ems, no R grip strength; symptoms improved on arrival to ED; pt states he felt dizzy prior to events; pt ana d a x 4 at present, following commands; L hip pain baseline, hx hip surgery 5 years ago; 164/112, HR 76, 98% RA, cbg 116, NSR

## 2023-01-13 NOTE — ED Notes (Signed)
ED TO INPATIENT HANDOFF REPORT  ED Nurse Name and Phone #:   S Name/Age/Gender [name]. 62 y.o. male Room/Bed: 016C/016C  Code Status   Code Status: Prior  Home/SNF/Other Home Patient oriented to: self, place, time, and situation Is this baseline? Yes   Triage Complete: Triage complete  Chief Complaint TIA (transient ischemic attack) [G45.9]  Triage Note Pt bib [facility]s [facility] a [facility] as a code stroke; pt woke "not feeling well", worsening stroke symptoms began at 1115 this am; hx prior stroke, r sided weakness, mild aphasia at baseline; both worsened today, along with dysarthria and r sided facial droop; pt unable to lift R arm initially with ems, no R grip strength; symptoms improved on arrival to ED; pt states he felt dizzy prior to events; pt ana d a x 4 at present, following commands; L hip pain baseline, hx hip surgery 5 years ago; 164/112, HR 76, 98% RA, cbg 116, NSR   Allergies Allergies  Allergen Reactions   Poison Ivy Extract [Poison Ivy Extract] Itching and Rash    Level of Care/Admitting Diagnosis ED Disposition     ED Disposition  Admit   Condition  --   [place]: [place] [[id number]]  Level of Care: Telemetry Medical [104]  May place patient in observation at [facility] or [place] if equivalent level of care is available:: No  Covid Evaluation: Asymptomatic - no recent exposure (last 10 days) testing not required  Diagnosis: TIA (transient ischemic attack) [id number]  Admitting Physician: Norval Morton [id number]  Attending Physician: Norval Morton [id number]          B Medical/Surgery History Past Medical History:  Diagnosis Date   Ambulates with cane    straight - uses occasionally   Anxiety    due to the stroke   Arthritis    Back pain    hx of buldging disc   Complication of anesthesia    took a while for him to wake up after previous anesthesia   Diabetes mellitus without  complication ([place])    Family history of adverse reaction to anesthesia    "sometimes mom has a hard time waking up"   High cholesterol    takes Zocor daily   Hypertension    takes Benazepril and HCTZ  daily   Joint pain    Joint swelling    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 Cindie Laroche says he has it   Slurred speech    Stroke ([place]) 08/2013   7 mini-strokes, last stroke 2017   TIA (transient ischemic attack) 2014   x 7    Urinary frequency    Past Surgical History:  Procedure Laterality Date   ABDOMINAL EXPOSURE N/A 06/16/2018   Procedure: ABDOMINAL EXPOSURE;  Surgeon: Rosetta Posner, MD;  Location: [place];  Service: Vascular;  Laterality: N/A;   ANKLE SURGERY  2008   left ankle-otif-[facility]   ANTERIOR LUMBAR FUSION Bilateral 06/16/2018   Procedure: LUMBAR 4-5 LUMBAR 5-SACRUM 1 ANTERIOR LUMBAR INTERBODY FUSION WITH INSTRUMENTATION AND ALLOGRAFT;  Surgeon: Phylliss Bob, MD;  Location: [place];  Service: Orthopedics;  Laterality: Bilateral;   BACK SURGERY     HEMATOMA EVACUATION Left 08/13/2018  Procedure: EVACUATION HEMATOMA LEFT ABDOMINAL WALL;  Surgeon: Rosetta Posner, MD;  Location: [facility] OR;  Service: Vascular;  Laterality: Left;   IR RADIOLOGIST EVAL & MGMT  07/27/2018   IR US GUIDE BX ASP/DRAIN  07/14/2018   JOINT REPLACEMENT     both hips replaced    LUMBAR LAMINECTOMY/DECOMPRESSION MICRODISCECTOMY Right 10/17/2014   Procedure: LUMBAR LAMINECTOMY/DECOMPRESSION MICRODISCECTOMY 2 LEVELS;  Surgeon: [name], MD;  Location: [place] [facility];  Service: Neurosurgery;  Laterality: Right;  Right L45 L5S1 laminectomy and foraminotomy   MASS EXCISION  09/13/2012   Procedure: EXCISION MASS;  Surgeon: Jamesetta So, MD;  Location: [facility];  Service: General;  Laterality: N/A;   ROBOTIC ASSITED PARTIAL NEPHRECTOMY Left  03/30/2019   Procedure: XI ROBOTIC ASSITED PARTIAL NEPHRECTOMY POSSIBLE RADICAL NEPHRECTOMY;  Surgeon: Ceasar Mons, MD;  Location: [facility];  Service: Urology;  Laterality: Left;   SHOULDER ARTHROSCOPY WITH ROTATOR CUFF REPAIR Right 04/12/2020   Procedure: right shoulder arthroscopy, debridement, mini open rotator cuff tear repair;  Surgeon: Meredith Pel, MD;  Location: [place];  Service: Orthopedics;  Laterality: Right;   TONSILLECTOMY     TOTAL HIP ARTHROPLASTY Right 03/20/2015   TOTAL HIP ARTHROPLASTY Right 03/20/2015   Procedure: TOTAL HIP ARTHROPLASTY ANTERIOR APPROACH;  Surgeon: Renette Butters, MD;  Location: [place];  Service: Orthopedics;  Laterality: Right;   TOTAL HIP ARTHROPLASTY Left 01/08/2016   Procedure: TOTAL HIP ARTHROPLASTY ANTERIOR APPROACH;  Surgeon: Renette Butters, MD;  Location: [place];  Service: Orthopedics;  Laterality: Left;     A IV Location/Drains/Wounds Patient Lines/Drains/Airways Status     Active Line/Drains/Airways     Name Placement date Placement time Site Days   Peripheral IV 01/13/23 18 G Left Antecubital 01/13/23  --  Antecubital  less than 1   Closed System Drain 1 Left;Anterior LLQ Other (Comment) 12 Fr. 07/14/18  1441  LLQ  1644   Closed System Drain 1 Left Abdomen Bulb (JP) 15 Fr. 08/13/18  1236  Abdomen  1614   Incision (Closed) 08/13/18 Abdomen Other (Comment) 08/13/18  1240  -- 1614   Incision (Closed) 03/30/19 Abdomen Left 03/30/19  1101  -- 1385   Incision (Closed) 04/12/20 Shoulder Right 04/12/20  1922  -- 1006   Incision - 4 Ports Abdomen 1: Left;Proximal;Mid 2: Left;Distal;Upper 3: Left;Distal;Medial 4: Left;Distal;Lower 03/30/19  --  -- 1385            Intake/Output Last 24 hours No intake or output data in the 24 hours ending 01/13/23 1521  Labs/Imaging Results for orders placed or performed during the hospital encounter of 01/13/23 (from the past 48 hour(s))  CBG monitoring, ED     Status: Abnormal   Collection  Time: 01/13/23 12:23 PM  Result Value Ref Range   Glucose-Capillary 122 (H) 70 - 99 mg/dL    Comment: Glucose reference range applies only to samples taken after fasting for at least 8 hours.  Protime-INR     Status: None   Collection Time: 01/13/23 12:24 PM  Result Value Ref Range   Prothrombin Time 13.2 11.4 - 15.2 seconds   INR 1.0 0.8 - 1.2    Comment: (NOTE) INR goal varies based on device and disease states. Performed at [facility], [place] [address], [place], [place] [address]   APTT     Status: None   Collection Time: 01/13/23 12:24 PM  Result Value Ref Range   aPTT 30 24 - 36 seconds  Comment: Performed at [facility], [place] [address], [place], [place] [address]  CBC     Status: Abnormal   Collection Time: 01/13/23 12:24 PM  Result Value Ref Range   WBC 6.5 4.0 - 10.5 K/uL   RBC 4.26 4.22 - 5.81 MIL/uL   Hemoglobin 11.7 (L) 13.0 - 17.0 g/dL   HCT 36.6 (L) 39.0 - 52.0 %   MCV 85.9 80.0 - 100.0 fL   MCH 27.5 26.0 - 34.0 pg   MCHC 32.0 30.0 - 36.0 g/dL   RDW 15.3 11.5 - 15.5 %   Platelets 245 150 - 400 K/uL   nRBC 0.0 0.0 - 0.2 %    Comment: Performed at [facility], [place] [address], [place], [place] [address]  Differential     Status: None   Collection Time: 01/13/23 12:24 PM  Result Value Ref Range   Neutrophils Relative % 72 %   Neutro Abs 4.6 1.7 - 7.7 K/uL   Lymphocytes Relative 21 %   Lymphs Abs 1.3 0.7 - 4.0 K/uL   Monocytes Relative 6 %   Monocytes Absolute 0.4 0.1 - 1.0 K/uL   Eosinophils Relative 1 %   Eosinophils Absolute 0.1 0.0 - 0.5 K/uL   Basophils Relative 0 %   Basophils Absolute 0.0 0.0 - 0.1 K/uL   Immature Granulocytes 0 %   Abs Immature Granulocytes 0.02 0.00 - 0.07 K/uL    Comment: Performed at [place] [address], [place], [place] [address]  Comprehensive metabolic panel     Status: Abnormal   Collection Time: 01/13/23 12:24 PM  Result Value Ref Range   Sodium 136 135 - 145 mmol/L   Potassium 4.0 3.5 -  5.1 mmol/L   Chloride 102 98 - 111 mmol/L   CO2 25 22 - 32 mmol/L   Glucose, Bld 124 (H) 70 - 99 mg/dL    Comment: Glucose reference range applies only to samples taken after fasting for at least 8 hours.   BUN 25 (H) 8 - 23 mg/dL   Creatinine, Ser 1.86 (H) 0.61 - 1.24 mg/dL   Calcium 10.6 (H) 8.9 - 10.3 mg/dL   Total Protein 6.7 6.5 - 8.1 g/dL   Albumin 3.6 3.5 - 5.0 g/dL   AST 17 15 - 41 U/L   ALT 15 0 - 44 U/L   Alkaline Phosphatase 82 38 - 126 U/L   Total Bilirubin 0.2 (L) 0.3 - 1.2 mg/dL   GFR, Estimated 40 (L) >60 mL/min    Comment: (NOTE) Calculated using the CKD-EPI Creatinine Equation (2021)    Anion gap 9 5 - 15    Comment: Performed at [place] [address], [place], [place] [address]  Ethanol     Status: None   Collection Time: 01/13/23 12:24 PM  Result Value Ref Range   Alcohol, Ethyl (B) <10 <10 mg/dL    Comment: (NOTE) Lowest detectable limit for serum alcohol is 10 mg/dL.  For medical purposes only. Performed at [facility], [place] [address] [place], [place] [address]   Hemoglobin A1c     Status: Abnormal   Collection Time: 01/13/23 12:24 PM  Result Value Ref Range   Hgb A1c MFr Bld 6.4 (H) 4.8 - 5.6 %    Comment: (NOTE) Pre diabetes:          5.7%-6.4%  Diabetes:              >6.4%  Glycemic control for   <7.0% adults with diabetes    Mean Plasma Glucose 136.98 mg/dL    Comment: Performed at [facility] [address], [place], [place] [address]  I-stat chem 8, ED     Status: Abnormal   Collection Time: 01/13/23 12:31 PM  Result Value Ref Range   Sodium 140 135 - 145 mmol/L   Potassium 4.1 3.5 - 5.1 mmol/L   Chloride 104 98 - 111 mmol/L   BUN 26 (H) 8 - 23 mg/dL   Creatinine, Ser 2.00 (H) 0.61 - 1.24 mg/dL   Glucose, Bld 123 (H) 70 - 99 mg/dL    Comment: Glucose reference range applies only to samples taken after fasting for at least 8 hours.   Calcium, Ion 1.40 1.15 - 1.40 mmol/L   TCO2 25 22 - 32 mmol/L    Hemoglobin 12.2 (L) 13.0 - 17.0 g/dL   HCT 36.0 (L) 39.0 - 52.0 %  Resp panel by RT-PCR (RSV, Flu A&B, Covid) Anterior Nasal Swab     Status: None   Collection Time: 01/13/23 12:34 PM   Specimen: Anterior Nasal Swab  Result Value Ref Range   SARS Coronavirus 2 by RT PCR NEGATIVE NEGATIVE   Influenza A by PCR NEGATIVE NEGATIVE   Influenza B by PCR NEGATIVE NEGATIVE    Comment: (NOTE) The Xpert Xpress SARS-CoV-2/FLU/RSV plus assay is intended as an aid in the diagnosis of influenza from Nasopharyngeal swab specimens and should not be used as a sole basis for treatment. Nasal washings and aspirates are unacceptable for Xpert Xpress SARS-CoV-2/FLU/RSV testing.  Fact Sheet for Patients: EntrepreneurPulse.com.au  Fact Sheet for Healthcare Providers: IncredibleEmployment.be  This test is not yet approved or cleared by the [place] FDA and has been authorized for detection and/or diagnosis of SARS-CoV-2 by FDA under an Emergency Use Authorization (EUA). This EUA will remain in effect (meaning this test can be used) for the duration of the COVID-19 declaration under Section 564(b)(1) of the Act, 21 U.S.C. section 360bbb-3(b)(1), unless the authorization is terminated or revoked.     Resp Syncytial Virus by PCR NEGATIVE NEGATIVE    Comment: (NOTE) Fact Sheet for Patients: EntrepreneurPulse.com.au  Fact Sheet for Healthcare Providers: IncredibleEmployment.be  This test is not yet approved or cleared by the [place] FDA and has been authorized for detection and/or diagnosis of SARS-CoV-2 by FDA under an Emergency Use Authorization (EUA). This EUA will remain in effect (meaning this test can be used) for the duration of the COVID-19 declaration under Section 564(b)(1) of the Act, 21 U.S.C. section 360bbb-3(b)(1), unless the authorization is terminated or revoked.  Performed at [facility],  [place] [address] [place], [place] [address]   Urine rapid drug screen (hosp performed)not at [facility]     Status: Abnormal   Collection Time: 01/13/23  1:00 PM  Result Value Ref Range   Opiates NONE DETECTED NONE DETECTED   Cocaine POSITIVE (A) NONE DETECTED   Benzodiazepines NONE DETECTED NONE DETECTED   Amphetamines NONE DETECTED NONE DETECTED   Tetrahydrocannabinol NONE DETECTED NONE DETECTED   Barbiturates NONE DETECTED NONE DETECTED    Comment: (NOTE) DRUG SCREEN FOR MEDICAL PURPOSES ONLY.  IF CONFIRMATION IS NEEDED FOR ANY PURPOSE, NOTIFY LAB WITHIN 5 DAYS.  LOWEST DETECTABLE LIMITS FOR URINE DRUG SCREEN Drug Class                     Cutoff (ng/mL) Amphetamine and metabolites    1000  Barbiturate and metabolites    200 Benzodiazepine                 200 Opiates and metabolites        300 Cocaine and metabolites        300 THC                            50 Performed at [facility], [place] [address] [place], [place] [address]    VAS US CAROTID (at [facility] only)  Result Date: 01/13/2023 Carotid Arterial Duplex Study Patient Name:  Kyris Marschke.  Date of Exam:   01/13/2023 Medical Rec #: [id number]           Accession #:    [id number] Date of Birth: 08/07/1961           Patient Gender: M Patient Age:   49 years Exam Location:  [facility] Procedure:      VAS US CAROTID Referring Phys: Lovey Newcomer --------------------------------------------------------------------------------  Indications:       TIA. Risk Factors:      Hypertension, hyperlipidemia, Diabetes, current smoker, prior                    MI, prior CVA. Comparison Study:  10-08-2020 Carotid duplex showed minimal intimal thickening                    right carotid bulb, minimal plaque left ICA. Performing Technologist: Darlin Coco RDMS, RVT  Examination Guidelines: A complete evaluation includes B-mode imaging, spectral Doppler, color Doppler, and power Doppler as needed of all accessible portions of each vessel.  Bilateral testing is considered an integral part of a complete examination. Limited examinations for reoccurring indications may be performed as noted.  Right Carotid Findings: +----------+--------+--------+--------+---------------------+------------------+           PSV cm/sEDV cm/sStenosisPlaque Description   Comments           +----------+--------+--------+--------+---------------------+------------------+ CCA Prox  114     26                                                      +----------+--------+--------+--------+---------------------+------------------+ CCA Distal22                      Abnormal waveforms-  intimal thickening                                   end diastolic                                                             reversal                                +----------+--------+--------+--------+---------------------+------------------+ ICA Prox  28      12                                                      +----------+--------+--------+--------+---------------------+------------------+  ICA Distal42      22                                                      +----------+--------+--------+--------+---------------------+------------------+ ECA       44      15                                                      +----------+--------+--------+--------+---------------------+------------------+ +----------+--------+-------+---------+-------------------+           PSV cm/sEDV cmsDescribe Arm Pressure (mmHG) +----------+--------+-------+---------+-------------------+ Subclavian               Turbulent                    +----------+--------+-------+---------+-------------------+ +---------+--------+--+--------+-+---------+ VertebralPSV cm/s32EDV cm/s9Antegrade +---------+--------+--+--------+-+---------+  Left Carotid Findings: +----------+--------+--------+--------+------------------+------------------+           PSV  cm/sEDV cm/sStenosisPlaque DescriptionComments           +----------+--------+--------+--------+------------------+------------------+ CCA Prox  55      16                                                   +----------+--------+--------+--------+------------------+------------------+ CCA Distal32      11                                                   +----------+--------+--------+--------+------------------+------------------+ ICA Prox  41      12              Abnormal waveformsintimal thickening +----------+--------+--------+--------+------------------+------------------+ ICA Distal51      21                                                   +----------+--------+--------+--------+------------------+------------------+ ECA       72      21                                                   +----------+--------+--------+--------+------------------+------------------+ +----------+--------+--------+---------+-------------------+           PSV cm/sEDV cm/sDescribe Arm Pressure (mmHG) +----------+--------+--------+---------+-------------------+ Subclavian108             Turbulent                    +----------+--------+--------+---------+-------------------+ +---------+--------+--+--------+--+---------+ VertebralPSV cm/s66EDV cm/s28Antegrade +---------+--------+--+--------+--+---------+   Summary: Right Carotid: The extracranial vessels were near-normal with only minimal wall                thickening or plaque. Abnormal waveform morphology- end diastolic  reversal at the distal CCA. Left Carotid: The extracranial vessels were near-normal with only minimal wall               thickening or plaque. Abnormal waveform morphology -proximal left               ICA. Vertebrals:  Bilateral vertebral arteries demonstrate antegrade flow. Subclavians: Bilateral subclavian artery flow was disturbed. *See table(s) above for measurements and observations.   Electronically signed by Servando Snare MD on 01/13/2023 at 3:20:49 PM.    Final    ECHOCARDIOGRAM COMPLETE  Result Date: 01/13/2023    ECHOCARDIOGRAM REPORT   Patient Name:   Jeff Grube. Date of Exam: 01/13/2023 Medical Rec #:  [id number]          Height:       78.0 in Accession #:    [id number]         Weight:       251.8 lb Date of Birth:  06/26/1961          BSA:          2.490 m Patient Age:    50 years           BP:           162/92 mmHg Patient Gender: M                  HR:           66 bpm. Exam Location:  Inpatient Procedure: 2D Echo, Cardiac Doppler and Color Doppler Indications:    TIA  History:        Patient has prior history of Echocardiogram examinations, most                 recent 12/09/2019. Risk Factors:Diabetes, Dyslipidemia, Current                 Smoker and Hypertension. Hx TIA, stroke.  Sonographer:    Clayton Lefort RDCS (AE) Referring Phys: [place]  1. Left ventricular ejection fraction, by estimation, is 30 to 35%. The left ventricle has moderately decreased function. The left ventricle demonstrates global hypokinesis. There is mild concentric left ventricular hypertrophy. Left ventricular diastolic parameters are consistent with Grade I diastolic dysfunction (impaired relaxation).  2. Right ventricular systolic function is normal. The right ventricular size is normal. Tricuspid regurgitation signal is inadequate for assessing PA pressure.  3. The mitral valve is normal in structure. Trivial mitral valve regurgitation.  4. The aortic valve is tricuspid. There is mild calcification of the aortic valve. There is mild thickening of the aortic valve. Aortic valve regurgitation is not visualized. Aortic valve sclerosis/calcification is present, without any evidence of aortic stenosis.  5. Aortic dilatation noted. There is borderline dilatation of the aortic root, measuring 38 mm. There is mild dilatation of the ascending aorta, measuring 41 mm.  6. The inferior vena  cava is normal in size with greater than 50% respiratory variability, suggesting right atrial pressure of 3 mmHg. Comparison(s): Compared to prior TTE, the EF has decreased from 40-45% to ~35%. Otherwise, there is no significant change. Conclusion(s)/Recommendation(s): No intracardiac source of embolism detected on this transthoracic study. Consider a transesophageal echocardiogram to exclude cardiac source of embolism if clinically indicated. FINDINGS  Left Ventricle: Left ventricular ejection fraction, by estimation, is 30 to 35%. The left ventricle has moderately decreased function. The left ventricle demonstrates global hypokinesis. The left ventricular internal cavity size was normal in  size. There is mild concentric left ventricular hypertrophy. Left ventricular diastolic parameters are consistent with Grade I diastolic dysfunction (impaired relaxation). Right Ventricle: The right ventricular size is normal. No increase in right ventricular wall thickness. Right ventricular systolic function is normal. Tricuspid regurgitation signal is inadequate for assessing PA pressure. Left Atrium: Left atrial size was normal in size. Right Atrium: Right atrial size was normal in size. Pericardium: There is no evidence of pericardial effusion. Mitral Valve: The mitral valve is normal in structure. Trivial mitral valve regurgitation. Tricuspid Valve: The tricuspid valve is normal in structure. Tricuspid valve regurgitation is trivial. Aortic Valve: The aortic valve is tricuspid. There is mild calcification of the aortic valve. There is mild thickening of the aortic valve. Aortic valve regurgitation is not visualized. Aortic valve sclerosis/calcification is present, without any evidence of aortic stenosis. Aortic valve mean gradient measures 4.0 mmHg. Aortic valve peak gradient measures 7.3 mmHg. Aortic valve area, by VTI measures 2.31 cm. Pulmonic Valve: The pulmonic valve was not well visualized. Aorta: Aortic dilatation  noted. There is borderline dilatation of the aortic root, measuring 38 mm. There is mild dilatation of the ascending aorta, measuring 41 mm. Venous: The inferior vena cava is normal in size with greater than 50% respiratory variability, suggesting right atrial pressure of 3 mmHg. IAS/Shunts: The atrial septum is grossly normal.  LEFT VENTRICLE PLAX 2D LVIDd:         4.70 cm      Diastology LVIDs:         4.20 cm      LV e' medial:    4.57 cm/s LV PW:         1.10 cm      LV E/e' medial:  9.5 LV IVS:        1.10 cm      LV e' lateral:   5.55 cm/s LVOT diam:     2.30 cm      LV E/e' lateral: 7.8 LV SV:         65 LV SV Index:   26 LVOT Area:     4.15 cm  LV Volumes (MOD) LV vol d, MOD A2C: 220.0 ml LV vol d, MOD A4C: 281.0 ml LV vol s, MOD A2C: 141.0 ml LV vol s, MOD A4C: 188.0 ml LV SV MOD A2C:     79.0 ml LV SV MOD A4C:     281.0 ml LV SV MOD BP:      88.3 ml RIGHT VENTRICLE             IVC RV Basal diam:  3.50 cm     IVC diam: 2.20 cm RV Mid diam:    3.60 cm RV S prime:     11.10 cm/s TAPSE (M-mode): 1.9 cm LEFT ATRIUM             Index        RIGHT ATRIUM           Index LA diam:        2.90 cm 1.16 cm/m   RA Area:     24.50 cm LA Vol (A2C):   72.7 ml 29.20 ml/m  RA Volume:   85.10 ml  34.18 ml/m LA Vol (A4C):   66.8 ml 26.83 ml/m LA Biplane Vol: 70.6 ml 28.36 ml/m  AORTIC VALVE AV Area (Vmax):    2.63 cm AV Area (Vmean):   2.21 cm AV Area (VTI):     2.31 cm AV Vmax:  135.00 cm/s AV Vmean:          99.200 cm/s AV VTI:            0.282 m AV Peak Grad:      7.3 mmHg AV Mean Grad:      4.0 mmHg LVOT Vmax:         85.50 cm/s LVOT Vmean:        52.800 cm/s LVOT VTI:          0.157 m LVOT/AV VTI ratio: 0.56  AORTA Ao Root diam: 3.80 cm Ao Asc diam:  4.10 cm MITRAL VALVE MV Area (PHT): 2.32 cm    SHUNTS MV Decel Time: 327 msec    Systemic VTI:  0.16 m MV E velocity: 43.30 cm/s  Systemic Diam: 2.30 cm MV A velocity: 71.60 cm/s MV E/A ratio:  0.60 Gwyndolyn Kaufman MD Electronically signed by Gwyndolyn Kaufman MD Signature Date/Time: 01/13/2023/2:34:05 PM    Final    CT HEAD CODE STROKE WO CONTRAST  Result Date: 01/13/2023 CLINICAL DATA:  Code stroke.  Slurred speech. EXAM: CT HEAD WITHOUT CONTRAST TECHNIQUE: Contiguous axial images were obtained from the base of the skull through the vertex without intravenous contrast. RADIATION DOSE REDUCTION: This exam was performed according to the departmental dose-optimization program which includes automated exposure control, adjustment of the mA and/or kV according to patient size and/or use of iterative reconstruction technique. COMPARISON:  CT head 10/30/2021 FINDINGS: Brain: There is no acute intracranial hemorrhage, extra-axial fluid collection, or acute infarct. Parenchymal volume is within normal limits. The ventricles are normal in size. Remote infarcts in the bilateral basal ganglia/corona radiata and left aspect of the corpus callosum and background chronic small-vessel ischemic change are noted. The pituitary and suprasellar region are normal. There is no mass lesion. There is no mass effect or midline shift. Vascular: No dense vessel is seen. There is calcification of the bilateral carotid siphons and vertebral arteries. Skull: Choose Sinuses/Orbits: The paranasal sinuses are clear. The globes and orbits are unremarkable. Other: None. ASPECTS [facility] Stroke Program Early CT Score) - Ganglionic level infarction (caudate, lentiform nuclei, internal capsule, insula, M1-M3 cortex): 7 - Supraganglionic infarction (M4-M6 cortex): 3 Total score (0-10 with 10 being normal): 10 IMPRESSION: 1. No acute intracranial pathology. 2. ASPECTS is 10 Code stroke imaging results were communicated on 01/13/2023 at 12:39 pm to provider Dr. Erlinda Hong. Electronically Signed   By: Valetta Mole M.D.   On: 01/13/2023 12:40    Pending Labs Unresulted Labs (From admission, onward)     Start     Ordered   01/14/23 0500  Lipid panel  (Labs)  Tomorrow morning,   R       Comments:  Fasting    01/13/23 1250            Vitals/Pain Today's Vitals   01/13/23 1315 01/13/23 1421 01/13/23 1512 01/13/23 1514  BP: (!) 162/92 (!) 168/113 (!) 168/101   Pulse: 71 68 67 70  Resp: 11 (!) 21  (!) 23  Temp:      TempSrc:      SpO2: 99% 100% 100% 100%  Weight:      Height:      PainSc:        Isolation Precautions Airborne and Contact precautions  Medications Medications  sodium chloride flush (NS) 0.9 % injection 3 mL (3 mLs Intravenous Not Given 01/13/23 1256)   stroke: early stages of recovery book (has no administration in time range)  0.9 %  sodium chloride infusion (has no administration in time range)  sodium chloride 0.9 % bolus 1,000 mL (0 mLs Intravenous Stopped 01/13/23 1428)    Mobility walks with person assist     Focused Assessments    R Recommendations: See Admitting Provider Note  Report given to:   Additional Notes:

## 2023-01-13 NOTE — ED Notes (Signed)
Pt transported to vascular.  °

## 2023-01-13 NOTE — Code Documentation (Signed)
Stroke Response Nurse Documentation Code Documentation  Raymond Barrera. is a 62 y.o. male arriving to [facility]  via [facility] on 01/13/2023 with past medical hx of CVA, HTN, DM, OSA, MI. On No antithrombotic. Code stroke was activated by EMS.   Patient from home where he was LKW at 1115 and now complaining of right side weakness and difficulty speaking.   He was at a doctor's office with his wife for her appointment.  He went to the bathroom and became very dizzy and had increased weakness of his right and  more difficulty speaking.   Stroke team at the bedside on patient arrival. Labs drawn and patient cleared for CT by Dr. Pearline Cables. Patient to CT with team. NIHSS 4, see documentation for details and code stroke times. Patient with right facial droop, left leg weakness, and dysarthria  on exam. The following imaging was completed:  CT Head. Patient is not a candidate for IV Thrombolytic due to rapidly improving symptoms. Patient is not a candidate for IR due to no LVO suspected.   Care Plan: NIHSS and VS q 30 min x 12 hours.   Bedside handoff with ED RN Venetia Night.    Raliegh Ip  Stroke Response RN

## 2023-01-13 NOTE — Progress Notes (Signed)
  Echocardiogram 2D Echocardiogram has been performed.  Raymond Barrera 01/13/2023, 2:06 PM

## 2023-01-13 NOTE — Consult Note (Signed)
NEUROLOGY CONSULTATION NOTE   Date of service: January 13, 2023 Patient Name: Raymond Barrera. MRN:  [id number] DOB:  [birth year] Reason for consult: "CODE STROKE" Requesting Provider: [name], DO  History of Present Illness  Raymond Barrera. is a 62 y.o. male with PMH significant for DM, hyperlipidemia hypertension MI 1997 sleep apnea stroke 2014 with some residual right-sided weakness and some slurred speech who presents as a code stroke PE BIBEMS from patient's wife's doctor's office.  Patient stated he went to the bathroom and then as he tried to get up he felt really dizzy and could not speak normally after that.  On assessment in the ED patient was able to lift right arm and hold it with coaching left leg limited in movement due to pain, patient speech was delayed at first but did improve in CT scan and after imaging.  NIH of 4 for slight right facial droop limited left leg movement and slightly slurred speech.  After imaging, patient stated he felt he felt much better and was "pretty much back to normal".    ROS   Constitutional Denies weight loss, fever and chills.   HEENT Denies changes in vision and hearing.   Respiratory Denies SOB and cough.   CV Denies palpitations and CP   GI Denies abdominal pain, nausea, vomiting and diarrhea.   GU Denies dysuria and urinary frequency.   MSK Denies myalgia. Positive for left hip pain, increased with movement.    Skin Denies rash and pruritus.   Neurological Denies headache and syncope.   Psychiatric Denies recent changes in mood. Denies anxiety and depression.    Past History   Past Medical History:  Diagnosis Date   Ambulates with cane    straight - uses occasionally   Anxiety    due to the stroke   Arthritis    Back pain    hx of buldging disc   Complication of anesthesia    took a while for him to wake up after previous anesthesia   Diabetes mellitus without complication ([place])    Family history of adverse reaction  to anesthesia    "sometimes mom has a hard time waking up"   High cholesterol    takes Zocor daily   Hypertension    takes Benazepril and HCTZ  daily   Joint pain    Joint swelling    Memory impairment    occassional - from stroke   Myocardial infarction ([place]) 1987   Pneumonia    hx of-80's   Shortness of breath dyspnea    do to pain   Sleep apnea    never had a sleep study,but states Dr. Cindie Laroche says he has it   Slurred speech    Stroke ([place]) 08/2013   7 mini-strokes, last stroke 2017   TIA (transient ischemic attack) 2014   x 7    Urinary frequency    Past Surgical History:  Procedure Laterality Date   ABDOMINAL EXPOSURE N/A 06/16/2018   Procedure: ABDOMINAL EXPOSURE;  Surgeon: Rosetta Posner, MD;  Location: [place];  Service: Vascular;  Laterality: N/A;   ANKLE SURGERY  2008   left ankle-otif-[facility]   ANTERIOR LUMBAR FUSION Bilateral 06/16/2018   Procedure: LUMBAR 4-5 LUMBAR 5-SACRUM 1 ANTERIOR LUMBAR INTERBODY FUSION WITH INSTRUMENTATION AND ALLOGRAFT;  Surgeon: Phylliss Bob, MD;  Location: [place];  Service: Orthopedics;  Laterality: Bilateral;   BACK SURGERY     HEMATOMA EVACUATION Left 08/13/2018   Procedure:  EVACUATION HEMATOMA LEFT ABDOMINAL WALL;  Surgeon: Rosetta Posner, MD;  Location: [facility] OR;  Service: Vascular;  Laterality: Left;   IR RADIOLOGIST EVAL & MGMT  07/27/2018   IR US GUIDE BX ASP/DRAIN  07/14/2018   JOINT REPLACEMENT     both hips replaced    LUMBAR LAMINECTOMY/DECOMPRESSION MICRODISCECTOMY Right 10/17/2014   Procedure: LUMBAR LAMINECTOMY/DECOMPRESSION MICRODISCECTOMY 2 LEVELS;  Surgeon: [name], MD;  Location: [place] [facility];  Service: Neurosurgery;  Laterality: Right;  Right L45 L5S1 laminectomy and foraminotomy   MASS EXCISION  09/13/2012   Procedure: EXCISION MASS;  Surgeon: Jamesetta So, MD;  Location: [facility];  Service: General;  Laterality: N/A;   ROBOTIC ASSITED PARTIAL NEPHRECTOMY Left 03/30/2019   Procedure: XI ROBOTIC ASSITED PARTIAL  NEPHRECTOMY POSSIBLE RADICAL NEPHRECTOMY;  Surgeon: Ceasar Mons, MD;  Location: [facility];  Service: Urology;  Laterality: Left;   SHOULDER ARTHROSCOPY WITH ROTATOR CUFF REPAIR Right 04/12/2020   Procedure: right shoulder arthroscopy, debridement, mini open rotator cuff tear repair;  Surgeon: Meredith Pel, MD;  Location: [place];  Service: Orthopedics;  Laterality: Right;   TONSILLECTOMY     TOTAL HIP ARTHROPLASTY Right 03/20/2015   TOTAL HIP ARTHROPLASTY Right 03/20/2015   Procedure: TOTAL HIP ARTHROPLASTY ANTERIOR APPROACH;  Surgeon: Renette Butters, MD;  Location: [place];  Service: Orthopedics;  Laterality: Right;   TOTAL HIP ARTHROPLASTY Left 01/08/2016   Procedure: TOTAL HIP ARTHROPLASTY ANTERIOR APPROACH;  Surgeon: Renette Butters, MD;  Location: [place];  Service: Orthopedics;  Laterality: Left;   Family History  Problem Relation Age of Onset   Heart disease Father    Social History   Socioeconomic History   Marital status: Married    Spouse name: Not on file   Number of children: Not on file   Years of education: Not on file   Highest education level: Not on file  Occupational History   Not on file  Tobacco Use   Smoking status: Some Days    Packs/day: 0.25    Years: 47.00    Total pack years: 11.75    Types: Cigarettes   Smokeless tobacco: Never  Vaping Use   Vaping Use: Never used  Substance and Sexual Activity   Alcohol use: Not Currently    Comment: quit 2012   Drug use: Not Currently    Types: Cocaine    Comment: many yrs ago., last time- late 2016   Sexual activity: Yes  Other Topics Concern   Not on file  Social History Narrative   Not on file   Social Determinants of Health   Financial Resource Strain: Not on file  Food Insecurity: Not on file  Transportation Needs: Not on file  Physical Activity: Not on file  Stress: Not on file  Social Connections: Not on file   Allergies  Allergen Reactions   Poison Ivy Extract [Poison Ivy Extract]  Itching and Rash    Medications  (Not in a hospital admission)    Vitals   Vitals:   01/13/23 1200 01/13/23 1245 01/13/23 1246  BP:  (!) 155/101   Pulse:  71   Resp:  19   Temp:   97.6 F (36.4 C)  TempSrc:   Oral  SpO2:  99%   Weight: 114.2 kg    Height: 6' 6"$  (1.981 m)       Body mass index is 29.09 kg/m.  Physical Exam   General: Laying comfortably in bed; in no acute distress.  HENT: Normal oropharynx and mucosa. Normal external appearance of ears and nose.  Neck: Supple, no pain or tenderness  CV: No JVD. No peripheral edema.  Pulmonary: Symmetric Chest rise. Normal respiratory effort.  Abdomen: Soft to touch, non-tender.  Ext: No cyanosis, edema, or deformity  Skin: No rash. Normal palpation of skin.   Musculoskeletal: Normal digits and nails by inspection.  Neurologic Examination  Mental status/Cognition: Alert, oriented to self, place,and year. Attention and Orientation improved throughout exam.  Speech/language: Slight dysarthria, naming/comprehension/repetition intact.   Cranial nerves:  PERRL, VF full, EOMI, NO gaze.deviation/nystagmus Facial sensation symmetric, slight R facial droop Hearing intact to voice, midline tongue protrusion Symmetric shoulder shrug   Motor:  RUE: 4+/5, good effort against resistance, no drift RLE: 4-/5, weak effort against resistance, no drift.  LUE: 4/5, some effort against resistance, no drift LLE: 1/5 due to pain in left hip, unable to lift off or maintain off bed.   Sensation: Intact and symmetrical throughout   Coordination/Complex Motor:  - Finger to Nose: intact bilaterally - Heel to shin: intact RLE, LLE UTA d/t pain  - Gait: deferred  Labs   CBC:  Recent Labs  Lab 01/13/23 1224 01/13/23 1231  WBC 6.5  --   NEUTROABS 4.6  --   HGB 11.7* 12.2*  HCT 36.6* 36.0*  MCV 85.9  --   PLT 245  --     Basic Metabolic Panel:  Lab Results  Component Value Date   NA 140 01/13/2023   K 4.1 01/13/2023    CO2 26 07/12/2021   GLUCOSE 123 (H) 01/13/2023   BUN 26 (H) 01/13/2023   CREATININE 2.00 (H) 01/13/2023   CALCIUM 9.6 07/12/2021   GFRNONAA 36 (L) 07/12/2021   GFRAA 40 (L) 04/12/2020   Lipid Panel:  Lab Results  Component Value Date   LDLCALC 89 10/08/2020   HgbA1c:  Lab Results  Component Value Date   HGBA1C 6.8 (H) 10/08/2020   Urine Drug Screen:     Component Value Date/Time   LABOPIA NONE DETECTED 07/12/2021 1807   COCAINSCRNUR POSITIVE (A) 07/12/2021 1807   LABBENZ NONE DETECTED 07/12/2021 1807   AMPHETMU NONE DETECTED 07/12/2021 1807   THCU NONE DETECTED 07/12/2021 1807   LABBARB NONE DETECTED 07/12/2021 1807    Alcohol Level     Component Value Date/Time   ETH <10 10/07/2020 0906    CT Head without contrast(Personally reviewed):  No acute intracranial pathology. ASPECTS is 10  MR Angio head without contrast and Carotid Duplex BL: pending  MRI Brain: pending    Impression   Raymond Laso. is a 62 y.o. male with PMH significant for DM, hyperlipidemia hypertension MI 1997 sleep apnea stroke 2014 with some residual right-sided weakness and some slurred speech who presents as a code stroke PE BIBEMS from patient's wife's doctor's office.  Patient's symptoms almost completely resolved after CT imaging.  NIH 4.  Patient able to move himself over from the CT scanner back to ED stretcher.  Etiology could be TIA vs. Near syncope. Pending full imaging. Will check orthostatic vitals. Since we cannot rule out TIA at this time we will recommend DAPT for 3 weeks, as patient reportedly takes aspirin 325 at home already.    Recommendations   - Frequent Neuro checks per stroke unit protocol - MRI/MRA Brain ordered - Vascular imaging - Carotid Doppler ordered - TTE ordered - check orthostatic vitals - Lipid panel - Statin - will be started if LDL>70 or otherwise  medically indicated - A1C - Antithrombotic - Recommend Aspirin and Plavix for 3 weeks, then  monotherapy.  - DVT ppx - SCDs - Smoking cessation - will counsel patient. Patient states he only smokes 2 a day.  - SBP goal - <220, PRN labetalol if HR>60 and PRN Hydralazine if HR<60 - Telemetry monitoring for arrhythmia - 72h - Swallow screen - will be performed prior to PO intake - Stroke education - will be given - PT/OT/SLP  Stroke team will follow.  ______________________________________________________________________   Pt seen by Neuro NP/APP and later by MD. Note/plan to be edited by MD as needed.    Otelia Santee, DNP, AGACNP-BC [facility] Please use AMION for pager and EPIC for messaging  Thank you for the opportunity to take part in the care of this patient. If you have any further questions, please contact the neurology consultation attending.  ATTENDING NOTE: I reviewed above note and agree with the assessment and plan. Pt was seen and examined.   62 yo M with hx of HTN, HLD, stroke, CAD/MI, OSA and smoker presented to ED from his wife's doctor's office. He accompanied with his wife for her appointment at his baseline. At baseline, he has mild right sided weakness and mild slurry speech and right facial droop from previous stroke. Around 11:15 am, he went to bathroom and came out sat in the chair, he felt dizzy and then worsening right sided weakness and slurry speech. He was worried about his BP and glucose. EMS was called on arrival, BP 164/112 and glucose 166, and  he had severe weakness on the right arm and leg, not able to walk. On ER arrival, pt symptoms much improved, after CT, symptoms nearly back to baseline. CT no acute finding but remote infarcts in the bilateral BG/CR and left aspect of the corpus callosum.   He had fluctuating left sided weakness in 03/2013, received tPA. MRI no acute infarct. MRA showed intracranial athero. In 10/2020 pt admitted for left CC body infarct on MRI, and multifocal intracranial stenosis on MRA. He was discharged with  DAPT for 3 months and continued on zocor.   On this admission, pt not candidate for TNK given rapid near resolved symptoms, not IR candidate given no sign of LVO. Etiology for pt symptoms could be TIA vs. Near syncope with recrudescence of old symptoms. Recommend observation overnight for further stroke work up with MRI MRA, CUS and TTE. Check LDL and A1C. Will also check orthostatic vitals. Will recommend DAPT for 3 weeks and then either ASA or plavix alone. Consider statin if LDL not at goal. PT/OT/speech. Quit smoking. Will follow.  For detailed assessment and plan, please refer to above/below as I have made changes wherever appropriate.   Rosalin Hawking, MD PhD Stroke Neurology 01/13/2023 1:25 PM

## 2023-01-13 NOTE — ED Provider Notes (Signed)
[facility] PROGRESSIVE CARE Provider Note  CSN: [id number] Arrival date & time: 01/13/23 1222  Chief Complaint(s) No chief complaint on file.  HPI Raymond Barrera. is a 62 y.o. male with past medical history as below, significant for use of a cane as ambulation aid, DM, hypertension, occasional memory impairment, prior CVA with right-sided residual deficit who presents to the ED with complaint of stroke alert.  Patient was at PCPs office started having weakness worsened from his prior, speech abnormality.  EMS was called and sent to the ED for further evaluation.  Patient met on arrival at the bridge, airway was clear, patient sent directly to CT with stroke team after IV access was obtained and he was sent urgently to CT w/ neuro  Past Medical History Past Medical History:  Diagnosis Date  . Ambulates with cane    straight - uses occasionally  . Anxiety    due to the stroke  . Arthritis   . Back pain    hx of buldging disc  . Complication of anesthesia    took a while for him to wake up after previous anesthesia  . Diabetes mellitus without complication ([place])   . Family history of adverse reaction to anesthesia    "sometimes mom has a hard time waking up"  . High cholesterol    takes Zocor daily  . Hypertension    takes Benazepril and HCTZ  daily  . Joint pain   . Joint swelling   . Memory impairment    occassional - from stroke  . Myocardial infarction ([place]) 1987  . Pneumonia    hx of-80's  . Shortness of breath dyspnea    do to pain  . Sleep apnea    never had a sleep study,but states Dr. Cindie Laroche says he has it  . Slurred speech   . Stroke ([place]) 08/2013   7 mini-strokes, last stroke 2017  . TIA (transient ischemic attack) 2014   x 7   . Urinary frequency    Patient Active Problem List   Diagnosis Date Noted  . Normocytic anemia 01/13/2023  . Chronic kidney disease, stage III (moderate) ([place]) 01/13/2023  . History of CVA with residual deficit 01/13/2023   . CVA (cerebral vascular accident) ([place]) 10/07/2020  . Syncope 04/18/2019  . Chronic systolic heart failure ([place])   . Pain of both hip joints   . History of renal cell carcinoma   . Renal mass 03/30/2019  . Abdominal hematoma 08/13/2018  . Radiculopathy 06/16/2018  . Acute CVA (cerebrovascular accident) ([place]) 02/01/2017  . Diabetes mellitus ([place]) 02/01/2017  . HTN (hypertension) 02/01/2017  . Primary osteoarthritis of left hip 01/08/2016  . DJD (degenerative joint disease) 03/20/2015  . Primary osteoarthritis of right hip 02/26/2015  . Lumbar spondylosis 10/17/2014  . Lumbago 07/25/2014  . Abnormality of gait 07/25/2014  . Difficulty in walking(719.7) 07/25/2014  . TIA (transient ischemic attack) 03/09/2013  . Cocaine abuse ([place]) 03/09/2013  . Accelerated hypertension 03/09/2013  . Hyperlipidemia 03/09/2013  . Current smoker 03/09/2013   Home Medication(s) Prior to Admission medications   Medication Sig Start Date End Date Taking? Authorizing Provider  gabapentin (NEURONTIN) 100 MG capsule Take 100 mg by mouth 2 (two) times daily.   Yes [provider]  glucose 4 GM chewable tablet Chew 1 tablet by mouth as needed for low blood sugar.   Yes [provider]  JARDIANCE 10 MG TABS tablet Take 10 mg by mouth daily. 11/27/22  Yes [provider]  lidocaine (LIDODERM) 5 % Place 1 patch onto the skin daily. Remove & Discard patch within 12 hours or as directed by MD Patient taking differently: Place 1 patch onto the skin daily as needed (PAIN). Remove & Discard patch within 12 hours or as directed by MD 07/10/19  Yes Langston Masker B, PA-C  ondansetron (ZOFRAN) 4 MG tablet Take 1 tablet (4 mg total) by mouth every 8 (eight) hours as needed for nausea or vomiting. 03/30/19  Yes Dancy, Estill Bamberg, PA-C  ramipril (ALTACE) 10 MG capsule Take 10 mg by mouth daily. 11/27/22  Yes [provider]  tiZANidine (ZANAFLEX) 4 MG tablet Take 0.5-1 tablets (2-4 mg total) by  mouth 2 (two) times daily as needed for muscle spasms. 05/11/20  Yes Meredith Pel, MD  aspirin EC 81 MG tablet Take 1 tablet (81 mg total) by mouth daily for 21 days. Swallow whole. 01/15/23 02/05/23  Cherene Altes, MD  atorvastatin (LIPITOR) 40 MG tablet Take 1 tablet (40 mg total) by mouth daily. 01/16/23   Cherene Altes, MD  clopidogrel (PLAVIX) 75 MG tablet Take 1 tablet (75 mg total) by mouth daily. 01/16/23   Cherene Altes, MD                                                                                                                                    Past Surgical History Past Surgical History:  Procedure Laterality Date  . ABDOMINAL EXPOSURE N/A 06/16/2018   Procedure: ABDOMINAL EXPOSURE;  Surgeon: Rosetta Posner, MD;  Location: [facility];  Service: Vascular;  Laterality: N/A;  . ANKLE SURGERY  2008   left ankle-otif-[facility]  . ANTERIOR LUMBAR FUSION Bilateral 06/16/2018   Procedure: LUMBAR 4-5 LUMBAR 5-SACRUM 1 ANTERIOR LUMBAR INTERBODY FUSION WITH INSTRUMENTATION AND ALLOGRAFT;  Surgeon: Phylliss Bob, MD;  Location: [facility];  Service: Orthopedics;  Laterality: Bilateral;  . BACK SURGERY    . HEMATOMA EVACUATION Left 08/13/2018   Procedure: EVACUATION HEMATOMA LEFT ABDOMINAL WALL;  Surgeon: Rosetta Posner, MD;  Location: [facility];  Service: Vascular;  Laterality: Left;  . IR RADIOLOGIST EVAL & MGMT  07/27/2018  . IR US GUIDE BX ASP/DRAIN  07/14/2018  . JOINT REPLACEMENT     both hips replaced   . LUMBAR LAMINECTOMY/DECOMPRESSION MICRODISCECTOMY Right 10/17/2014   Procedure: LUMBAR LAMINECTOMY/DECOMPRESSION MICRODISCECTOMY 2 LEVELS;  Surgeon: [name], MD;  Location: [facility];  Service: Neurosurgery;  Laterality: Right;  Right L45 L5S1 laminectomy and foraminotomy  . MASS EXCISION  09/13/2012   Procedure: EXCISION MASS;  Surgeon: Jamesetta So, MD;  Location: [facility];  Service: General;  Laterality: N/A;  . ROBOTIC ASSITED PARTIAL NEPHRECTOMY Left 03/30/2019    Procedure: XI ROBOTIC ASSITED PARTIAL NEPHRECTOMY POSSIBLE RADICAL NEPHRECTOMY;  Surgeon: Ceasar Mons, MD;  Location: [facility];  Service: Urology;  Laterality: Left;  . SHOULDER ARTHROSCOPY WITH ROTATOR CUFF  REPAIR Right 04/12/2020   Procedure: right shoulder arthroscopy, debridement, mini open rotator cuff tear repair;  Surgeon: Meredith Pel, MD;  Location: [facility];  Service: Orthopedics;  Laterality: Right;  . TONSILLECTOMY    . TOTAL HIP ARTHROPLASTY Right 03/20/2015  . TOTAL HIP ARTHROPLASTY Right 03/20/2015   Procedure: TOTAL HIP ARTHROPLASTY ANTERIOR APPROACH;  Surgeon: Renette Butters, MD;  Location: [facility];  Service: Orthopedics;  Laterality: Right;  . TOTAL HIP ARTHROPLASTY Left 01/08/2016   Procedure: TOTAL HIP ARTHROPLASTY ANTERIOR APPROACH;  Surgeon: Renette Butters, MD;  Location: [facility];  Service: Orthopedics;  Laterality: Left;   Family History Family History  Problem Relation Age of Onset  . Heart disease Father     Social History Social History   Tobacco Use  . Smoking status: Some Days    Packs/day: 0.25    Years: 47.00    Total pack years: 11.75    Types: Cigarettes  . Smokeless tobacco: Never  Vaping Use  . Vaping Use: Never used  Substance Use Topics  . Alcohol use: Not Currently    Comment: quit 2012  . Drug use: Not Currently    Types: Cocaine    Comment: many yrs ago., last time- late 2016   Allergies Poison ivy extract [poison ivy extract]  Review of Systems Review of Systems  Constitutional:  Negative for chills and fever.  HENT:  Negative for facial swelling and trouble swallowing.   Eyes:  Negative for photophobia and visual disturbance.  Respiratory:  Negative for cough and shortness of breath.   Cardiovascular:  Negative for chest pain and palpitations.  Gastrointestinal:  Negative for abdominal pain, nausea and vomiting.  Endocrine: Negative for polydipsia and polyuria.  Genitourinary:  Negative for difficulty urinating and  hematuria.  Musculoskeletal:  Negative for gait problem and joint swelling.  Skin:  Negative for pallor and rash.  Neurological:  Positive for speech difficulty and weakness. Negative for syncope and headaches.  Psychiatric/Behavioral:  Negative for agitation and confusion.     Physical Exam Vital Signs  I have reviewed the triage vital signs BP (!) 154/106 (BP Location: Left Arm)   Pulse 80   Temp 97.7 F (36.5 C) (Oral)   Resp 18   Ht 6' 6"$  (1.981 m)   Wt 114.2 kg   SpO2 98%   BMI 29.09 kg/m  Physical Exam Vitals and nursing note reviewed.  Constitutional:      General: He is not in acute distress.    Appearance: He is well-developed.  HENT:     Head: Normocephalic and atraumatic.     Right Ear: External ear normal.     Left Ear: External ear normal.     Mouth/Throat:     Mouth: Mucous membranes are moist.  Eyes:     General: No scleral icterus. Cardiovascular:     Rate and Rhythm: Normal rate and regular rhythm.     Pulses: Normal pulses.     Heart sounds: Normal heart sounds.  Pulmonary:     Effort: Pulmonary effort is normal. No respiratory distress.     Breath sounds: Normal breath sounds.  Abdominal:     General: Abdomen is flat.     Palpations: Abdomen is soft.     Tenderness: There is no abdominal tenderness.  Musculoskeletal:     Cervical back: No rigidity.     Right lower leg: No edema.     Left lower leg: No edema.  Skin:  General: Skin is warm and dry.     Capillary Refill: Capillary refill takes less than 2 seconds.  Neurological:     Mental Status: He is alert and oriented to person, place, and time.     GCS: GCS eye subscore is 4. GCS verbal subscore is 5. GCS motor subscore is 6.     Cranial Nerves: Cranial nerves 2-12 are intact. No dysarthria.     Sensory: Sensation is intact.     Motor: Motor function is intact. No tremor.     Coordination: Coordination is intact.     Comments: Question of right sided weakness on arrival but by the time  pt returned from CT he was asymptomatic   Gait not tested 2/2 pt safety  Psychiatric:        Mood and Affect: Mood normal.        Behavior: Behavior normal.    ED Results and Treatments Labs (all labs ordered are listed, but only abnormal results are displayed) Labs Reviewed  CBC - Abnormal; Notable for the following components:      Result Value   Hemoglobin 11.7 (*)    HCT 36.6 (*)    All other components within normal limits  COMPREHENSIVE METABOLIC PANEL - Abnormal; Notable for the following components:   Glucose, Bld 124 (*)    BUN 25 (*)    Creatinine, Ser 1.86 (*)    Calcium 10.6 (*)    Total Bilirubin 0.2 (*)    GFR, Estimated 40 (*)    All other components within normal limits  HEMOGLOBIN A1C - Abnormal; Notable for the following components:   Hgb A1c MFr Bld 6.4 (*)    All other components within normal limits  RAPID URINE DRUG SCREEN, HOSP PERFORMED - Abnormal; Notable for the following components:   Cocaine POSITIVE (*)    All other components within normal limits  LIPID PANEL - Abnormal; Notable for the following components:   Triglycerides 548 (*)    HDL 28 (*)    All other components within normal limits  GLUCOSE, CAPILLARY - Abnormal; Notable for the following components:   Glucose-Capillary 107 (*)    All other components within normal limits  LDL CHOLESTEROL, DIRECT - Abnormal; Notable for the following components:   Direct LDL 107.9 (*)    All other components within normal limits  GLUCOSE, CAPILLARY - Abnormal; Notable for the following components:   Glucose-Capillary 134 (*)    All other components within normal limits  GLUCOSE, CAPILLARY - Abnormal; Notable for the following components:   Glucose-Capillary 119 (*)    All other components within normal limits  GLUCOSE, CAPILLARY - Abnormal; Notable for the following components:   Glucose-Capillary 153 (*)    All other components within normal limits  GLUCOSE, CAPILLARY - Abnormal; Notable for  the following components:   Glucose-Capillary 121 (*)    All other components within normal limits  GLUCOSE, CAPILLARY - Abnormal; Notable for the following components:   Glucose-Capillary 129 (*)    All other components within normal limits  GLUCOSE, CAPILLARY - Abnormal; Notable for the following components:   Glucose-Capillary 146 (*)    All other components within normal limits  CBG MONITORING, ED - Abnormal; Notable for the following components:   Glucose-Capillary 122 (*)    All other components within normal limits  I-STAT CHEM 8, ED - Abnormal; Notable for the following components:   BUN 26 (*)    Creatinine, Ser 2.00 (*)  Glucose, Bld 123 (*)    Hemoglobin 12.2 (*)    HCT 36.0 (*)    All other components within normal limits  RESP PANEL BY RT-PCR (RSV, FLU A&B, COVID)  RVPGX2  PROTIME-INR  APTT  DIFFERENTIAL  ETHANOL  GLUCOSE, CAPILLARY  CBG MONITORING, ED                                                                                                                          Radiology No results found.  Pertinent labs & imaging results that were available during my care of the patient were reviewed by me and considered in my medical decision making (see MDM for details).  Medications Ordered in ED Medications  sodium chloride 0.9 % bolus 1,000 mL (0 mLs Intravenous Stopped 01/13/23 1428)   stroke: early stages of recovery book ( Does not apply Given 01/14/23 0944)  0.9 %  sodium chloride infusion (0 mLs Intravenous Stopped 01/13/23 1750)                                                                                                                                     Procedures .Critical Care  Performed by: [name], DO Authorized by: [name], DO   Critical care provider statement:    Critical care time (minutes):  30   Critical care time was exclusive of:  Separately billable procedures and treating other patients   Critical care was necessary to  treat or prevent imminent or life-threatening deterioration of the following conditions:  CNS failure or compromise   Critical care was time spent personally by me on the following activities:  Development of treatment plan with patient or surrogate, discussions with consultants, evaluation of patient's response to treatment, examination of patient, ordering and review of laboratory studies, ordering and review of radiographic studies, ordering and performing treatments and interventions, pulse oximetry, re-evaluation of patient's condition and review of old charts   Care discussed with: admitting provider     (including critical care time)  Medical Decision Making / ED Course    Medical Decision Making:    Yaphet Melnyk. is a 62 y.o. male with past medical history as below, significant for use of a cane as ambulation aid, DM, hypertension, occasional memory impairment, prior CVA with right-sided residual deficit who presents to the ED with complaint of stroke alert.  . The  complaint involves an extensive differential diagnosis and also carries with it a high risk of complications and morbidity.  Serious etiology was considered. Ddx includes but is not limited to: Differential diagnoses for altered mental status includes but is not exclusive to alcohol, illicit or prescription medications, intracranial pathology such as stroke, intracerebral hemorrhage, fever or infectious causes including sepsis, hypoxemia, uremia, trauma, endocrine related disorders such as diabetes, hypoglycemia, thyroid-related diseases, etc.   Complete initial physical exam performed, notably the patient  was airway intact, HDS, speaking but tearful.    Reviewed and confirmed nursing documentation for past medical history, family history, social history.  Vital signs reviewed.    Clinical Course as of 01/17/23 1055  Tue Jan 13, 2023  1449 Spoke w/ Dr Tamala Julian, accepts pt for admit  [SG]    Clinical Course User  Index [SG] [name], DO   Discussed with neurology at bedside, CT head was unremarkable.  Recommend MRI and carotid ultrasound.  Recommend admission for remainder of stroke workup.  No TNK.  Neurology recommends MRA and MRI brain without contrast. Symptoms improved from the onset, improved while in the ER. Upon arriving to the room he feels back to normal He was having some right-sided weakness and slurred speech on arrival to the ED but the symptoms have resolved and he is back to his baseline. Labs reviewed, creatinine is similar to his baseline, UDS was positive for cocaine unfortunately.  Ethanol negative. EKG similar to prior Recommend admission to medical service for TIA workup/stroke workup and neurology to remain on consult.  Additional history obtained: -Additional history obtained from ems -External records from outside source obtained and reviewed including: Chart review including previous notes, labs, imaging, consultation notes including prior ed visits, prior labs/imaging/home meds   Lab Tests: -I ordered, reviewed, and interpreted labs.   The pertinent results include:   Labs Reviewed  CBC - Abnormal; Notable for the following components:      Result Value   Hemoglobin 11.7 (*)    HCT 36.6 (*)    All other components within normal limits  COMPREHENSIVE METABOLIC PANEL - Abnormal; Notable for the following components:   Glucose, Bld 124 (*)    BUN 25 (*)    Creatinine, Ser 1.86 (*)    Calcium 10.6 (*)    Total Bilirubin 0.2 (*)    GFR, Estimated 40 (*)    All other components within normal limits  HEMOGLOBIN A1C - Abnormal; Notable for the following components:   Hgb A1c MFr Bld 6.4 (*)    All other components within normal limits  RAPID URINE DRUG SCREEN, HOSP PERFORMED - Abnormal; Notable for the following components:   Cocaine POSITIVE (*)    All other components within normal limits  LIPID PANEL - Abnormal; Notable for the following components:    Triglycerides 548 (*)    HDL 28 (*)    All other components within normal limits  GLUCOSE, CAPILLARY - Abnormal; Notable for the following components:   Glucose-Capillary 107 (*)    All other components within normal limits  LDL CHOLESTEROL, DIRECT - Abnormal; Notable for the following components:   Direct LDL 107.9 (*)    All other components within normal limits  GLUCOSE, CAPILLARY - Abnormal; Notable for the following components:   Glucose-Capillary 134 (*)    All other components within normal limits  GLUCOSE, CAPILLARY - Abnormal; Notable for the following components:   Glucose-Capillary 119 (*)    All other components within  normal limits  GLUCOSE, CAPILLARY - Abnormal; Notable for the following components:   Glucose-Capillary 153 (*)    All other components within normal limits  GLUCOSE, CAPILLARY - Abnormal; Notable for the following components:   Glucose-Capillary 121 (*)    All other components within normal limits  GLUCOSE, CAPILLARY - Abnormal; Notable for the following components:   Glucose-Capillary 129 (*)    All other components within normal limits  GLUCOSE, CAPILLARY - Abnormal; Notable for the following components:   Glucose-Capillary 146 (*)    All other components within normal limits  CBG MONITORING, ED - Abnormal; Notable for the following components:   Glucose-Capillary 122 (*)    All other components within normal limits  I-STAT CHEM 8, ED - Abnormal; Notable for the following components:   BUN 26 (*)    Creatinine, Ser 2.00 (*)    Glucose, Bld 123 (*)    Hemoglobin 12.2 (*)    HCT 36.0 (*)    All other components within normal limits  RESP PANEL BY RT-PCR (RSV, FLU A&B, COVID)  RVPGX2  PROTIME-INR  APTT  DIFFERENTIAL  ETHANOL  GLUCOSE, CAPILLARY  CBG MONITORING, ED    Notable for cocaine +  EKG   EKG Interpretation  Date/Time:  Tuesday January 13 2023 12:45:11 EST Ventricular Rate:  71 PR Interval:  202 QRS Duration: 110 QT  Interval:  385 QTC Calculation: 419 R Axis:   28 Text Interpretation: Sinus rhythm Abnormal R-wave progression, early transition LVH with IVCD and secondary repol abnrm Anterior ST elevation, probably due to LVH similar to prior Confirmed by Wynona Dove ([id number]) on 01/13/2023 2:31:39 PM         Imaging Studies ordered: I ordered imaging studies including ct stroke protocol I independently visualized the following imaging with scope of interpretation limited to determining acute life threatening conditions related to emergency care: as above, stable I independently visualized and interpreted imaging. I agree with the radiologist interpretation   Medicines ordered and prescription drug management: Meds ordered this encounter  Medications  . DISCONTD: sodium chloride flush (NS) 0.9 % injection 3 mL  . sodium chloride 0.9 % bolus 1,000 mL  .  stroke: early stages of recovery book  . DISCONTD: 0.9 %  sodium chloride infusion  . DISCONTD: hydrALAZINE (APRESOLINE) injection 10 mg  . DISCONTD: aspirin chewable tablet 81 mg  . DISCONTD: clopidogrel (PLAVIX) tablet 75 mg  . DISCONTD: nicotine (NICODERM CQ - dosed in mg/24 hr) patch 7 mg  . DISCONTD: ramipril (ALTACE) capsule 10 mg  . DISCONTD: gabapentin (NEURONTIN) capsule 100 mg  . DISCONTD: tiZANidine (ZANAFLEX) tablet 2-4 mg  . DISCONTD: empagliflozin (JARDIANCE) tablet 10 mg  . 0.9 %  sodium chloride infusion  . DISCONTD: enoxaparin (LOVENOX) injection 40 mg  . DISCONTD: atorvastatin (LIPITOR) tablet 40 mg  . atorvastatin (LIPITOR) 40 MG tablet    Sig: Take 1 tablet (40 mg total) by mouth daily.    Dispense:  30 tablet    Refill:  2  . clopidogrel (PLAVIX) 75 MG tablet    Sig: Take 1 tablet (75 mg total) by mouth daily.    Dispense:  30 tablet    Refill:  2  . aspirin EC 81 MG tablet    Sig: Take 1 tablet (81 mg total) by mouth daily for 21 days. Swallow whole.    Dispense:  21 tablet    Refill:  0    -I have reviewed the  patients home medicines and  have made adjustments as needed   Consultations Obtained: I requested consultation with the neuro,  and discussed lab and imaging findings as well as pertinent plan - they recommend: admit   Cardiac Monitoring: The patient was maintained on a cardiac monitor.  I personally viewed and interpreted the cardiac monitored which showed an underlying rhythm of: nsr  Social Determinants of Health:  Diagnosis or treatment significantly limited by social determinants of health: polysubstance abuse   Reevaluation: After the interventions noted above, I reevaluated the patient and found that they have improved  Co morbidities that complicate the patient evaluation . Past Medical History:  Diagnosis Date  . Ambulates with cane    straight - uses occasionally  . Anxiety    due to the stroke  . Arthritis   . Back pain    hx of buldging disc  . Complication of anesthesia    took a while for him to wake up after previous anesthesia  . Diabetes mellitus without complication ([place])   . Family history of adverse reaction to anesthesia    "sometimes mom has a hard time waking up"  . High cholesterol    takes Zocor daily  . Hypertension    takes Benazepril and HCTZ  daily  . Joint pain   . Joint swelling   . Memory impairment    occassional - from stroke  . Myocardial infarction ([place]) 1987  . Pneumonia    hx of-80's  . Shortness of breath dyspnea    do to pain  . Sleep apnea    never had a sleep study,but states Dr. Cindie Laroche says he has it  . Slurred speech   . Stroke ([place]) 08/2013   7 mini-strokes, last stroke 2017  . TIA (transient ischemic attack) 2014   x 7   . Urinary frequency       Dispostion: Disposition decision including need for hospitalization was considered, and patient admitted to the hospital.    Final Clinical Impression(s) / ED Diagnoses Final diagnoses:  TIA (transient ischemic attack)     This chart was dictated using voice  recognition software.  Despite best efforts to proofread,  errors can occur which can change the documentation meaning.    Wynona Dove A, DO 01/17/23 1056

## 2023-01-13 NOTE — ED Notes (Signed)
Pt returned from MRI °

## 2023-01-14 ENCOUNTER — Observation Stay (HOSPITAL_COMMUNITY): Payer: Medicaid Other

## 2023-01-14 DIAGNOSIS — G459 Transient cerebral ischemic attack, unspecified: Secondary | ICD-10-CM | POA: Diagnosis not present

## 2023-01-14 LAB — GLUCOSE, CAPILLARY
Glucose-Capillary: 134 mg/dL — ABNORMAL HIGH (ref 70–99)
Glucose-Capillary: 119 mg/dL — ABNORMAL HIGH (ref 70–99)
Glucose-Capillary: 153 mg/dL — ABNORMAL HIGH (ref 70–99)
Glucose-Capillary: 121 mg/dL — ABNORMAL HIGH (ref 70–99)

## 2023-01-14 LAB — LIPID PANEL
Cholesterol: 200 mg/dL (ref 0–200)
HDL: 28 mg/dL — ABNORMAL LOW (ref >40)
LDL Cholesterol: UNDETERMINED mg/dL (ref 0–99)
Total CHOL/HDL Ratio: 7.1 RATIO
Triglycerides: 548 mg/dL — ABNORMAL HIGH (ref <150)
VLDL: UNDETERMINED mg/dL (ref 0–40)

## 2023-01-14 LAB — LDL CHOLESTEROL, DIRECT: Direct LDL: 107.9 mg/dL — ABNORMAL HIGH (ref 0–99)

## 2023-01-14 MED ORDER — ATORVASTATIN CALCIUM 40 MG PO TABS
40.0000 mg | ORAL_TABLET | Freq: Every day | ORAL | Status: DC
  Administered 2023-01-14 – 2023-01-15 (×2): 40 mg via ORAL
  Filled 2023-01-14 (×2): qty 1

## 2023-01-14 MED ORDER — ENOXAPARIN SODIUM 40 MG/0.4ML IJ SOSY
40.0000 mg | PREFILLED_SYRINGE | INTRAMUSCULAR | Status: DC
  Administered 2023-01-14 – 2023-01-15 (×2): 40 mg via SUBCUTANEOUS
  Filled 2023-01-14 (×2): qty 0.4

## 2023-01-14 NOTE — Progress Notes (Signed)
   01/14/23 1500  Spiritual Encounters  Type of Visit Initial  Care provided to: Pt and family  Referral source Nurse (RN/NT/LPN)  Reason for visit Routine spiritual support  OnCall Visit No  Spiritual Framework  Presenting Themes Meaning/purpose/sources of inspiration;Values and beliefs  Values/beliefs God as healer  Community/Connection Faith community  Strengths belief in the power of God  Patient Stress Factors Health changes  Family Stress Factors None identified  Interventions  Spiritual Care Interventions Made Established relationship of care and support;Compassionate presence;Reflective listening;Narrative/life review;Explored values/beliefs/practices/strengths;Prayer  Intervention Outcomes  Outcomes Connection to spiritual care;Awareness around self/spiritual resourses   Chaplain provided support to patient and spouse. Compassionate presence, reflective listening and prayer were provided. Chaplain made spouse and patient aware of how to request further support. No follow up needed at this time.   Note prepared by Abbott Pao, [place] Resident [phone]

## 2023-01-14 NOTE — Evaluation (Signed)
Speech Language Pathology Evaluation Patient Details Name: Raymond Barrera. MRN: [id number] DOB: 01-Nov-1961 Today's Date: 01/14/2023 Time: [id number] SLP Time Calculation (min) (ACUTE ONLY): 14 min  Problem List:  Patient Active Problem List   Diagnosis Date Noted   Normocytic anemia 01/13/2023   Chronic kidney disease, stage III (moderate) ([place]) 01/13/2023   History of CVA with residual deficit 01/13/2023   CVA (cerebral vascular accident) ([place]) 10/07/2020   Syncope [id number]   Chronic systolic heart failure ([place])    Pain of both hip joints    History of renal cell carcinoma    Renal mass 03/30/2019   Abdominal hematoma 08/13/2018   Radiculopathy 06/16/2018   Acute CVA (cerebrovascular accident) ([place]) 02/01/2017   Diabetes mellitus ([place]) 02/01/2017   HTN (hypertension) 02/01/2017   Primary osteoarthritis of left hip 01/08/2016   DJD (degenerative joint disease) 03/20/2015   Primary osteoarthritis of right hip 02/26/2015   Lumbar spondylosis 10/17/2014   Lumbago 07/25/2014   Abnormality of gait 07/25/2014   Difficulty in walking(719.7) 07/25/2014   TIA (transient ischemic attack) 03/09/2013   Cocaine abuse ([place]) 03/09/2013   Accelerated hypertension 03/09/2013   Hyperlipidemia 03/09/2013   Current smoker 03/09/2013   Past Medical History:  Past Medical History:  Diagnosis Date   Ambulates with cane    straight - uses occasionally   Anxiety    due to the stroke   Arthritis    Back pain    hx of buldging disc   Complication of anesthesia    took a while for him to wake up after previous anesthesia   Diabetes mellitus without complication ([place])    Family history of adverse reaction to anesthesia    "sometimes mom has a hard time waking up"   High cholesterol    takes Zocor daily   Hypertension    takes Benazepril and HCTZ  daily   Joint pain    Joint swelling    Memory impairment    occassional - from stroke   Myocardial infarction ([place]) 1987   Pneumonia     hx of-80's   Shortness of breath dyspnea    do to pain   Sleep apnea    never had a sleep study,but states Dr. Cindie Laroche says he has it   Slurred speech    Stroke ([place]) 08/2013   7 mini-strokes, last stroke 2017   TIA (transient ischemic attack) 2014   x 7    Urinary frequency    Past Surgical History:  Past Surgical History:  Procedure Laterality Date   ABDOMINAL EXPOSURE N/A 06/16/2018   Procedure: ABDOMINAL EXPOSURE;  Surgeon: Rosetta Posner, MD;  Location: [facility];  Service: Vascular;  Laterality: N/A;   ANKLE SURGERY  2008   left ankle-otif-[facility]   ANTERIOR LUMBAR FUSION Bilateral 06/16/2018   Procedure: LUMBAR 4-5 LUMBAR 5-SACRUM 1 ANTERIOR LUMBAR INTERBODY FUSION WITH INSTRUMENTATION AND ALLOGRAFT;  Surgeon: Phylliss Bob, MD;  Location: [place];  Service: Orthopedics;  Laterality: Bilateral;   BACK SURGERY     HEMATOMA EVACUATION Left 08/13/2018   Procedure: EVACUATION HEMATOMA LEFT ABDOMINAL WALL;  Surgeon: Rosetta Posner, MD;  [facility];  Service: Vascular;  Laterality: Left;   IR RADIOLOGIST EVAL & MGMT  07/27/2018   IR US GUIDE BX ASP/DRAIN  07/14/2018   JOINT REPLACEMENT     both hips replaced    LUMBAR LAMINECTOMY/DECOMPRESSION MICRODISCECTOMY Right 10/17/2014   Procedure: LUMBAR LAMINECTOMY/DECOMPRESSION MICRODISCECTOMY 2 LEVELS;  Surgeon: [name], MD;  Location:  [place] [facility];  Service: Neurosurgery;  Laterality: Right;  Right L45 L5S1 laminectomy and foraminotomy   MASS EXCISION  09/13/2012   Procedure: EXCISION MASS;  Surgeon: Jamesetta So, MD;  Location: [facility];  Service: General;  Laterality: N/A;   ROBOTIC ASSITED PARTIAL NEPHRECTOMY Left 03/30/2019   Procedure: XI ROBOTIC ASSITED PARTIAL NEPHRECTOMY POSSIBLE RADICAL NEPHRECTOMY;  Surgeon: Ceasar Mons, MD;  Location: [facility];  Service: Urology;  Laterality: Left;   SHOULDER ARTHROSCOPY WITH ROTATOR CUFF REPAIR Right 04/12/2020   Procedure: right shoulder arthroscopy, debridement, mini open  rotator cuff tear repair;  Surgeon: Meredith Pel, MD;  Location: [place];  Service: Orthopedics;  Laterality: Right;   TONSILLECTOMY     TOTAL HIP ARTHROPLASTY Right 03/20/2015   TOTAL HIP ARTHROPLASTY Right 03/20/2015   Procedure: TOTAL HIP ARTHROPLASTY ANTERIOR APPROACH;  Surgeon: Renette Butters, MD;  Location: [place];  Service: Orthopedics;  Laterality: Right;   TOTAL HIP ARTHROPLASTY Left 01/08/2016   Procedure: TOTAL HIP ARTHROPLASTY ANTERIOR APPROACH;  Surgeon: Renette Butters, MD;  Location: [place];  Service: Orthopedics;  Laterality: Left;   HPI:  62 y.o. male presents to [facility] hospital on 01/13/2023 after experiencing dizziness and aphasia. Pt reports resolution of symptoms after imaging. MRI negative for acute abnormality. PMH includes DM, HLD, HTN, MI, OSA, CVA.   Assessment / Plan / Recommendation Clinical Impression  Wife present and reports she is pt's caregiver and is responsible for household finances and administering pt's medications. He has cognitive impairments from previous strokes in the areas of processing, memory. He demonstrates mild dysfluency that wife states is new from yesterday and speech is more affected. He is intelligible and she reports it is slured from his baseline. He followed directions and answered questions re: orientation accurately but needs additional time to process and express his thoughts. He was able to name 15 animals in under one minute and language was within normal range. 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 Pt would benefit from continued ST in hospital and at discharge. Wife reports pt worked with SLP outpatient in [place] and would like to go to outpatient for ST and therapist is in agreement. Therapist educated re: strategies to increase his fluency when expressing thoughts.    SLP Assessment  SLP Recommendation/Assessment: Patient needs continued Speech [place] [facility] SLP Visit  Diagnosis: Cognitive communication deficit (R41.841)    Recommendations for follow up therapy are one component of a multi-disciplinary discharge planning process, led by the attending physician.  Recommendations may be updated based on patient status, additional functional criteria and insurance authorization.    Follow Up Recommendations  Outpatient SLP    Assistance Recommended at Discharge  Frequent or constant Supervision/Assistance  Functional Status Assessment Patient has had a recent decline in their functional status and demonstrates the ability to make significant improvements in function in a reasonable and predictable amount of time.  Frequency and Duration min 2x/week         SLP Evaluation Cognition  Overall Cognitive Status: Impaired/Different from baseline Arousal/Alertness: Awake/alert Orientation Level: Oriented to time;Oriented to person;Oriented to place Year: 2024 Month: February Day of Week: Correct Attention: Sustained Sustained Attention: Appears intact Memory:  (not specifically tested) Awareness: Impaired Awareness Impairment: Anticipatory impairment;Emergent impairment;Intellectual impairment Problem Solving: Impaired Problem Solving Impairment: Verbal basic Safety/Judgment: Impaired (needs supervision)       Comprehension  Auditory Comprehension Overall Auditory Comprehension: Appears within functional limits for tasks  assessed Visual Recognition/Discrimination Discrimination: Not tested Reading Comprehension Reading Status: Not tested    Expression Expression Primary Mode of Expression: Verbal Verbal Expression Overall Verbal Expression: Impaired Initiation: No impairment (more so extra processing time) Repetition:  (NT) Naming: No impairment (15 animlas in one min) Pragmatics: No impairment Written Expression Dominant Hand: Right Written Expression: Not tested   Oral / Motor  Oral Motor/Sensory Function Overall Oral Motor/Sensory  Function: Within functional limits Motor Speech Overall Motor Speech: Impaired Respiration: Within functional limits Phonation: Normal Resonance: Within functional limits Articulation:  (intelligible, wife says it is slurred) Intelligibility: Intelligible Motor Planning: Impaired Level of Impairment: Phrase Motor Speech Errors:  (dysfluency)            Houston Siren 01/14/2023, 11:39 AM

## 2023-01-14 NOTE — Plan of Care (Signed)

## 2023-01-14 NOTE — Evaluation (Signed)
Occupational Therapy Evaluation Patient Details Name: Raymond Barrera. MRN: [id number] DOB: 1961-06-11 Today's Date: 01/14/2023   History of Present Illness 62 y.o. male presents to [facility] hospital on 01/13/2023 after experiencing dizziness and aphasia. Pt reports resolution of symptoms after imaging. MRI negative for acute abnormality. PMH includes DM, HLD, HTN, MI, OSA, CVA.   Clinical Impression   Prior to this admission, patient independent with ADLs, and driving. Patient's wife managed his medications for him. Currently, patient presenting with dizziness, seeing stars and spots when he quickly turns his head, need for increased assist with ADLs, L hip pain, and decreased strength and coordination in RUE. Increased time spent assessing patient's vision, with no nystagmus or difficulties noted other than decreased speed of saccadic movement. Patient provided squeeze ball for RUE, as well as education with regard to BE FAST acronym and not to return to driving until cleared by neurology. Patient would benefit from further education with regard to increasing RUE gross and fine motor coordination. OT will continue to follow acutely, with request placed for PT to assess vestibular system.      Recommendations for follow up therapy are one component of a multi-disciplinary discharge planning process, led by the attending physician.  Recommendations may be updated based on patient status, additional functional criteria and insurance authorization.   Follow Up Recommendations  Outpatient OT     Assistance Recommended at Discharge Frequent or constant Supervision/Assistance (Initially but will progeress to intermittent or set up quickly)  Patient can return home with the following A little help with walking and/or transfers;A little help with bathing/dressing/bathroom;Assistance with cooking/housework;Direct supervision/assist for medications management;Direct supervision/assist for financial  management;Assist for transportation;Help with stairs or ramp for entrance    Functional Status Assessment  Patient has had a recent decline in their functional status and demonstrates the ability to make significant improvements in function in a reasonable and predictable amount of time.  Equipment Recommendations  None recommended by OT (Patient has DME needed)    Recommendations for Other Services PT consult (Vestibular)     Precautions / Restrictions Precautions Precautions: Fall Precaution Comments: chronic LLE weakness and pain, chronic dysarthria Restrictions Weight Bearing Restrictions: No      Mobility Bed Mobility Overal bed mobility: Needs Assistance Bed Mobility: Supine to Sit, Sit to Supine     Supine to sit: Modified independent (Device/Increase time) Sit to supine: Modified independent (Device/Increase time)        Transfers Overall transfer level: Needs assistance Equipment used: Rolling walker (2 wheels) Transfers: Sit to/from Stand Sit to Stand: Min guard           General transfer comment: patient able to march in place and take steps to [place] Overall balance assessment: Needs assistance Sitting-balance support: No upper extremity supported, Feet supported Sitting balance-Leahy Scale: Good     Standing balance support: Single extremity supported, Reliant on assistive device for balance Standing balance-Leahy Scale: Fair                             ADL either performed or assessed with clinical judgement   ADL Overall ADL's : Needs assistance/impaired Eating/Feeding: Set up;Sitting   Grooming: Set up;Sitting   Upper Body Bathing: Set up;Sitting   Lower Body Bathing: Minimal assistance;Sitting/lateral leans;Sit to/from stand   Upper Body Dressing : Set up;Sitting   Lower Body Dressing: Minimal assistance;Sit to/from stand;Sitting/lateral leans Lower Body Dressing Details (  indicate cue type and reason): due to  L hip pain Toilet Transfer: Minimal assistance;Ambulation;Rolling walker (2 wheels)   Toileting- Clothing Manipulation and Hygiene: Minimal assistance;Sitting/lateral lean;Sit to/from stand Toileting - Clothing Manipulation Details (indicate cue type and reason): for thoroughness     Functional mobility during ADLs: Min guard;Cueing for safety;Rolling walker (2 wheels) General ADL Comments: Patient presenting with dizziness, seeing stars and spots when he quickly turns his head, need for increased assist with ADLs, L hip pain, and decreased strength and coordination in RUE.     Vision Baseline Vision/History: 0 No visual deficits Ability to See in Adequate Light: 0 Adequate Patient Visual Report: No change from baseline Vision Assessment?: Yes Eye Alignment: Within Functional Limits Ocular Range of Motion: Within Functional Limits Alignment/Gaze Preference: Within Defined Limits Tracking/Visual Pursuits: Able to track stimulus in all quads without difficulty Saccades: Decreased speed of saccadic movement;Overshoots;Undershoots Convergence: Within functional limits Visual Fields: No apparent deficits Depth Perception: Overshoots;Undershoots (Minimally) Additional Comments: Patient reporting "stars or spots" coming across his vision when he quickly turns his head. No nystagmus with OT.     Perception     Praxis      Pertinent Vitals/Pain Pain Assessment Pain Assessment: Faces Faces Pain Scale: Hurts little more Pain Location: L hip Pain Descriptors / Indicators: Aching, Sore, Tender Pain Intervention(s): Limited activity within patient's tolerance, Monitored during session, Repositioned, Heat applied     Hand Dominance Right   Extremity/Trunk Assessment Upper Extremity Assessment Upper Extremity Assessment: RUE deficits/detail RUE Deficits / Details: shoulder pain at baseline, patient can hold shoulder against gravity but painful, decreased grip and strength from a previous  stroke, however does endorse additional weakness RUE: Unable to fully assess due to pain;Shoulder pain with ROM RUE Sensation: WNL RUE Coordination: decreased fine motor;decreased gross motor   Lower Extremity Assessment Lower Extremity Assessment: Defer to PT evaluation   Cervical / Trunk Assessment Cervical / Trunk Assessment: Normal   Communication Communication Communication: Expressive difficulties (dysarthric at baseline, appears to be at baseline)   Cognition Arousal/Alertness: Awake/alert Behavior During Therapy: WFL for tasks assessed/performed (labile) Overall Cognitive Status: Impaired/Different from baseline Area of Impairment: Problem solving                             Problem Solving: Slow processing General Comments: Patient and wife state that there have been a few moments of decreased cognition, however is close to his baseline     General Comments       Exercises     Shoulder Instructions      Home Living Family/patient expects to be discharged to:: Private residence Living Arrangements: Spouse/significant other;Parent Available Help at Discharge: Family;Available PRN/intermittently Type of Home: House Home Access: Level entry     Home Layout: Multi-level (pt and his spouse live in the basement, mother lives upstairs) Alternate Level Stairs-Number of Steps: 13 Alternate Level Stairs-Rails: Left Bathroom Shower/Tub: Teacher, early years/pre: Handicapped height Bathroom Accessibility: Yes   Home Equipment: Conservation officer, nature (2 wheels);Cane - single point;Crutches      Lives With: Spouse    Prior Functioning/Environment Prior Level of Function : Independent/Modified Independent;Driving             Mobility Comments: retired, PRN use of SPC ADLs Comments: assists some with cleaning, spouse and mom do 91 of cooking, does not manage his medications        OT Problem List: Decreased strength;Decreased range of  motion;Decreased activity tolerance;Impaired balance (sitting and/or standing);Decreased coordination;Decreased cognition;Decreased safety awareness;Impaired UE functional use;Pain      OT Treatment/Interventions: Self-care/ADL training;Therapeutic exercise;Neuromuscular education;Energy conservation;DME and/or AE instruction;Manual therapy;Therapeutic activities;Cognitive remediation/compensation;Patient/family education;Balance training    OT Goals(Current goals can be found in the care plan section) Acute Rehab OT Goals Patient Stated Goal: to go home OT Goal Formulation: With patient/family Time For Goal Achievement: 01/28/23 Potential to Achieve Goals: Good ADL Goals Pt Will Perform Lower Body Bathing: Independently;with adaptive equipment;sitting/lateral leans;sit to/from stand Pt Will Perform Lower Body Dressing: Independently;sitting/lateral leans;sit to/from stand;with adaptive equipment Pt Will Transfer to Toilet: Independently;ambulating Pt Will Perform Toileting - Clothing Manipulation and hygiene: Independently;sitting/lateral leans;sit to/from stand Pt/caregiver will Perform Home Exercise Program: Right Upper extremity;With written HEP provided;Increased strength;Increased ROM  OT Frequency: Min 2X/week    Co-evaluation              AM-PAC OT "6 Clicks" Daily Activity     Outcome Measure Help from another person eating meals?: A Little Help from another person taking care of personal grooming?: A Little Help from another person toileting, which includes using toliet, bedpan, or urinal?: A Little Help from another person bathing (including washing, rinsing, drying)?: A Little Help from another person to put on and taking off regular upper body clothing?: A Little Help from another person to put on and taking off regular lower body clothing?: A Little 6 Click Score: 18   End of Session Equipment Utilized During Treatment: Gait belt;Rolling walker (2 wheels) Nurse  Communication: Mobility status  Activity Tolerance: Patient tolerated treatment well Patient left: in bed;with call bell/phone within reach;with family/visitor present  OT Visit Diagnosis: Unsteadiness on feet (R26.81);Other abnormalities of gait and mobility (R26.89);Muscle weakness (generalized) (M62.81);Other symptoms and signs involving cognitive function;Pain Pain - Right/Left: Left Pain - part of body: Hip                Time: [id number] OT Time Calculation (min): 24 min Charges:  OT General Charges $OT Visit: 1 Visit OT Evaluation $OT Eval Moderate Complexity: 1 Mod OT Treatments $Self Care/Home Management : 8-22 mins  Corinne Ports E. Deania Siguenza, OTR/L Acute Rehabilitation Services [phone]   Ascencion Dike 01/14/2023, 2:32 PM

## 2023-01-14 NOTE — Progress Notes (Signed)
Raymond Barrera.  [id number] DOB: Jul 17, 1961 DOA: 01/13/2023 PCP: Dulce Sellar, MD    Brief Narrative:  62 year old with a history of HTN, HLD, CAD, 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  The patient admits to occasional cocaine use and ongoing tobacco abuse.  At presentation a code stroke was called.  CT of the brain did not reveal any acute abnormality.  Consultants:  Stroke Team  Goals of Care:  Code Status: Full Code   DVT prophylaxis: Lovenox  Interim Hx: Afebrile.  Vital signs stable.  Complains of persisting left hip pain which he states has been present for many days now.  Pain is severe enough it makes him limit use of his left leg.  Denies chest pain or shortness of breath.  No new neurologic symptoms reported today.  Voices understanding that he must completely discontinue use of cocaine and tobacco.  Assessment & Plan:  Acute TIA with history of CVA v/s worsening of chronic right deficits due to cocaine/hypertensive urgency -MRI brain without evidence of acute intracranial abnormality -MRa head/neck noted no acute large vessel occlusion -TTE noted EF 30-35% with global hypokinesis and grade 1 DD but no intracardiac source of embolism detected -Carotid US - Extracranial vessels were near normal with only minimal wall thickening or plaque.  Abnormal waveform morphology-end-diastolic reversal at the distal CCA -medical treatment: DAPT x 3 weeks then  -LDL 108 -A1c 6.4 -PT/OT/SLP all recommend outpatient ongoing therapy  HTN Reasonably controlled at present  Cocaine and tobacco abuse Patient has been counseled on the direct link between cocaine tobacco abuse and blood vessel disease/stroke/heart attack -he has been informed he must stop both of these substances immediately  and entirely  Progressive systolic CHF TTE this admission noted EF 30-35% with global hypokinesis -euvolemic on exam at present -will need outpatient follow-up and workup -TTE 2021 noted EF 40-45% with global hypokinesis  DM2 A1c 6.4 -CBG presently well-controlled  Normocytic anemia Hemoglobin stable  Chronic gait disturbance Utilizes cane at baseline  CKD stage IIIb Baseline creatinine approximately 1.8  HLD Atorvastatin added as it does not appear he was on medication prior to admission   Family Communication: Spoke with wife at bedside Disposition: From home -for outpatient PT/OT -anticipate discharge home 2/15 unless acute findings on x-rays of left hip   Objective: Blood pressure (!) 138/90, pulse 74, temperature 98.1 F (36.7 C), temperature source Oral, resp. rate 18, height 6' 6"$  (1.981 m), weight 114.2 kg, SpO2 100 %.  Intake/Output Summary (Last 24 hours) at 01/14/2023 1024 Last data filed at 01/14/2023 0750 Gross per 24 hour  Intake --  Output 2200 ml  Net -2200 ml   Filed Weights   01/13/23 1200  Weight: 114.2 kg    Examination: General: No acute respiratory distress Lungs: Clear to auscultation bilaterally without wheezes or crackles Cardiovascular: Regular rate and rhythm without murmur gallop or rub normal S1 and S2 Abdomen: Nontender, nondistended, soft, bowel sounds positive, no rebound, no ascites, no appreciable mass Extremities: No significant cyanosis, clubbing, or edema bilateral lower extremities  CBC: Recent Labs  Lab 01/13/23 1224 01/13/23 1231  WBC 6.5  --   NEUTROABS 4.6  --   HGB 11.7* 12.2*  HCT 36.6* 36.0*  MCV 85.9  --   PLT 245  --  Basic Metabolic Panel: Recent Labs  Lab 01/13/23 1224 01/13/23 1231  NA 136 140  K 4.0 4.1  CL 102 104  CO2 25  --   GLUCOSE 124* 123*  BUN 25* 26*  CREATININE 1.86* 2.00*  CALCIUM 10.6*  --    GFR: Estimated Creatinine Clearance: 53.7 mL/min (A) (by C-G formula based on SCr of 2  mg/dL (H)).   Scheduled Meds:  aspirin  81 mg Oral Daily   clopidogrel  75 mg Oral Daily   empagliflozin  10 mg Oral Daily   gabapentin  100 mg Oral BID   nicotine  7 mg Transdermal Daily   ramipril  10 mg Oral Daily   sodium chloride flush  3 mL Intravenous Once      LOS: 0 days   Cherene Altes, MD [facility] Office  [phone] Pager - Text Page per Shea Evans  If 7PM-7AM, please contact night-coverage per Amion 01/14/2023, 10:24 AM

## 2023-01-14 NOTE — Progress Notes (Signed)
Physical Therapy Treatment and Discharge Patient Details Name: Raymond Barrera. MRN: [id number] DOB: 1961-01-29 Today's Date: 01/14/2023   History of Present Illness 62 y.o. male presents to [facility] hospital on 01/13/2023 after experiencing dizziness and aphasia. Pt reports resolution of symptoms after imaging. MRI negative for acute abnormality. PMH includes DM, HLD, HTN, MI, OSA, CVA.    PT Comments    Pt met his physical therapy goals during his inpatient stay. Continues to report muscular soreness and tenderness to palpation along L anterior and lateral hip. Pt denying increased pain with weightbearing during ambulation. Pt ambulating 300 ft with a walker and negotiated 5 steps without physical assist. Heat pack applied to L hip post session. No further acute PT needs. See below for follow up recommendation. Thank you for this consult.   Recommendations for follow up therapy are one component of a multi-disciplinary discharge planning process, led by the attending physician.  Recommendations may be updated based on patient status, additional functional criteria and insurance authorization.  Follow Up Recommendations  Outpatient PT (continue outpatient PT for low back pain management)     Assistance Recommended at Discharge Intermittent Supervision/Assistance  Patient can return home with the following A little help with walking and/or transfers;A little help with bathing/dressing/bathroom;Assistance with cooking/housework;Assist for transportation;Help with stairs or ramp for entrance;Direct supervision/assist for medications management;Direct supervision/assist for financial management   Equipment Recommendations  None recommended by PT    Recommendations for Other Services       Precautions / Restrictions Precautions Precautions: Fall Precaution Comments: chronic LLE weakness and pain, chronic dysarthria Restrictions Weight Bearing Restrictions: No     Mobility  Bed  Mobility Overal bed mobility: Modified Independent                  Transfers Overall transfer level: Modified independent Equipment used: Rolling walker (2 wheels)                    Ambulation/Gait Ambulation/Gait assistance: Modified independent (Device/Increase time) Gait Distance (Feet): 300 Feet Assistive device: Rolling walker (2 wheels) Gait Pattern/deviations: Decreased stance time - left, Step-through pattern Gait velocity: reduced     General Gait Details: Decreased WB through LLE, but demonstrates equal step lengths and good posture throughout   Stairs Stairs: Yes Stairs assistance: Supervision Stair Management: One rail Right Number of Stairs: 5 General stair comments: cues for sequencing   Wheelchair Mobility    Modified Rankin (Stroke Patients Only) Modified Rankin (Stroke Patients Only) Pre-Morbid Rankin Score: Slight disability Modified Rankin: Slight disability     Balance Overall balance assessment: Needs assistance Sitting-balance support: No upper extremity supported, Feet supported Sitting balance-Leahy Scale: Good     Standing balance support: No upper extremity supported, During functional activity Standing balance-Leahy Scale: Fair                              Cognition Arousal/Alertness: Awake/alert Behavior During Therapy: WFL for tasks assessed/performed Overall Cognitive Status: Impaired/Different from baseline Area of Impairment: Problem solving                             Problem Solving: Slow processing          Exercises      General Comments        Pertinent Vitals/Pain Pain Assessment Pain Assessment: Faces Faces Pain Scale: Hurts little more Pain Location:  L hip Pain Descriptors / Indicators: Aching, Sore, Tender Pain Intervention(s): Monitored during session, Heat applied    Home Living     Available Help at Discharge: Family;Available PRN/intermittently Type of  Home: House                  Prior Function            PT Goals (current goals can now be found in the care plan section) Acute Rehab PT Goals Patient Stated Goal: to reduce pain in left hip PT Goal Formulation: With patient Time For Goal Achievement: 01/27/23 Potential to Achieve Goals: Good Progress towards PT goals: Goals met/education completed, patient discharged from PT    Frequency    Min 3X/week      PT Plan Current plan remains appropriate    Co-evaluation              AM-PAC PT "6 Clicks" Mobility   Outcome Measure  Help needed turning from your back to your side while in a flat bed without using bedrails?: None Help needed moving from lying on your back to sitting on the side of a flat bed without using bedrails?: None Help needed moving to and from a bed to a chair (including a wheelchair)?: None Help needed standing up from a chair using your arms (e.g., wheelchair or bedside chair)?: None Help needed to walk in hospital room?: None Help needed climbing 3-5 steps with a railing? : A Lot 6 Click Score: 22    End of Session   Activity Tolerance: Patient tolerated treatment well Patient left: in bed;with call bell/phone within reach;with bed alarm set;with nursing/sitter in room Nurse Communication: Mobility status PT Visit Diagnosis: Other abnormalities of gait and mobility (R26.89);Pain (chronic LLE weakness) Pain - Right/Left: Left Pain - part of body: Hip     Time: [id number] PT Time Calculation (min) (ACUTE ONLY): 28 min  Charges:  $Therapeutic Activity: 23-37 mins                     Wyona Almas, PT, DPT Acute Rehabilitation Services Office [phone]    Deno Etienne 01/14/2023, 1:46 PM

## 2023-01-14 NOTE — Plan of Care (Signed)
  Problem: Education: Goal: Knowledge of disease or condition will improve Outcome: Progressing   Problem: Coping: Goal: Will identify appropriate support needs Outcome: Progressing   Problem: Health Behavior/Discharge Planning: Goal: Ability to manage health-related needs will improve Outcome: Progressing Goal: Goals will be collaboratively established with patient/family Outcome: Progressing   Problem: Self-Care: Goal: Ability to participate in self-care as condition permits will improve Outcome: Progressing   Problem: Nutrition: Goal: Risk of aspiration will decrease Outcome: Progressing Goal: Dietary intake will improve Outcome: Progressing   Problem: Coping: Goal: Level of anxiety will decrease Outcome: Progressing

## 2023-01-14 NOTE — Evaluation (Signed)
Physical Therapy Evaluation Patient Details Name: Raymond Barrera. MRN: [id number] DOB: 02-13-1961 Today's Date: 01/14/2023  History of Present Illness  62 y.o. male presents to [facility] hospital on 01/13/2023 after experiencing dizziness and aphasia. Pt reports resolution of symptoms after imaging. MRI negative for acute abnormality. PMH includes DM, HLD, HTN, MI, OSA, CVA.  Clinical Impression  Patient presents with mobility still close to mod I.  Some cues for safety due to cognitive deficits and wife present for education on safety measures due to some imbalance/wooziness symptoms consistent with post TIA/likely central source.  Noted L sided incoordination UE and LE (with chronic LE weakness) and positive motion sensitivity with modified Stoneman pike recovery (negative for nystagmus).  Feel current recommendations for follow up outpatient PT appropriate to address imbalance and to progress habituation.  PT will not follow in acute setting.  May need new referral to outpatient PT as was attending evidently due to low back pain. Wife and pt educated in fall prevention techniques given balance/dizziness symptoms.        Vestibular Assessment - 01/14/23 0001       Symptom Behavior   Subjective history of current problem reports dizziness that started yesterday when at doctors appointment for his wife.  States felt poorly and had some stroke-like symptoms so wife called EMS    Type of Dizziness  Imbalance;"Funny feeling in head"    Frequency of Dizziness intermittent    Duration of Dizziness minutes    Symptom Nature Variable;Motion provoked    Aggravating Factors Activity in general;Supine to sit    Relieving Factors Slow movements    Progression of Symptoms No change since onset    History of similar episodes none      Oculomotor Exam   Oculomotor Alignment Normal    Ocular ROM WNL    Spontaneous Absent    Gaze-induced  Absent    Smooth Pursuits Intact    Saccades Intact      Oculomotor  Exam-Fixation Suppressed    Left Head Impulse negative for refixation    Right Head Impulse negative for refixation      Vestibulo-Ocular Reflex   VOR 1 Head Only (x 1 viewing) intact to head nods and head turns without symptom provocation    VOR Cancellation Normal      Positional Testing   Sidelying Test Sidelying Right;Sidelying Left      Sidelying Right   Sidelying Right Duration 30 sec    Sidelying Right Symptoms No nystagmus   positive for symptoms upon recovery from sidelying test     Sidelying Left   Sidelying Left Duration 30 sec    Sidelying Left Symptoms No nystagmus   positive for symptoms upon recovery from sidelying test              Recommendations for follow up therapy are one component of a multi-disciplinary discharge planning process, led by the attending physician.  Recommendations may be updated based on patient status, additional functional criteria and insurance authorization.  Follow Up Recommendations Outpatient PT (for balance therapy in addition to low back pain rehab)      Assistance Recommended at Discharge Intermittent Supervision/Assistance  Patient can return home with the following  Assistance with cooking/housework;Assist for transportation;Help with stairs or ramp for entrance;Direct supervision/assist for medications management;Direct supervision/assist for financial management    Equipment Recommendations None recommended by PT  Recommendations for Other Services       Functional Status Assessment Patient has had a recent  decline in their functional status and demonstrates the ability to make significant improvements in function in a reasonable and predictable amount of time.     Precautions / Restrictions Precautions Precautions: Fall Precaution Comments: chronic LLE weakness and pain, chronic dysarthria Restrictions Weight Bearing Restrictions: No      Mobility  Bed Mobility Overal bed mobility: Modified Independent                   Transfers Overall transfer level: Modified independent Equipment used: Rolling walker (2 wheels)               General transfer comment: up from EOB and up from toilet in bathroom on his own with RW    Ambulation/Gait Ambulation/Gait assistance: Modified independent (Device/Increase time) Gait Distance (Feet): 20 Feet Assistive device: Rolling walker (2 wheels) Gait Pattern/deviations: Step-through pattern, Decreased stride length, Decreased dorsiflexion - left, Wide base of support       General Gait Details: to bathroom then back to bed, stopped to wash hands at sink education throughout on using walker full time when on his feet at home and keeping non-skid footwear on and lights on for helping with imbalance  Stairs            Wheelchair Mobility    Modified Rankin (Stroke Patients Only) Modified Rankin (Stroke Patients Only) Pre-Morbid Rankin Score: Slight disability Modified Rankin: Slight disability     Balance Overall balance assessment: Needs assistance   Sitting balance-Leahy Scale: Good     Standing balance support: No upper extremity supported, During functional activity Standing balance-Leahy Scale: Fair Standing balance comment: washing hands at sink                             Pertinent Vitals/Pain Pain Assessment Faces Pain Scale: No hurt    Home Living Family/patient expects to be discharged to:: Private residence Living Arrangements: Spouse/significant other;Parent Available Help at Discharge: Family;Available PRN/intermittently Type of Home: House Home Access: Level entry     Alternate Level Stairs-Number of Steps: 13 Home Layout: Multi-level (pt and spouse live in basement, his mother lives upstairs) Home Equipment: Conservation officer, nature (2 wheels);Cane - single point;Crutches Additional Comments: bathroom is upstairs, they have urinal and BSC downstairs    Prior Function Prior Level of Function :  Independent/Modified Independent;Driving             Mobility Comments: retired, PRN use of [employer] ADLs Comments: assists some with cleaning, spouse and mom do majority of cooking, does not manage his medications     Hand Dominance   Dominant Hand: Right    Extremity/Trunk Assessment   Upper Extremity Assessment Upper Extremity Assessment: LUE deficits/detail RUE Deficits / Details: shoulder pain at baseline, patient can hold shoulder against gravity but painful, decreased grip and strength from a previous stroke, however does endorse additional weakness RUE: Unable to fully assess due to pain;Shoulder pain with ROM RUE Sensation: WNL RUE Coordination: decreased fine motor;decreased gross motor LUE Deficits / Details: decreased coordination with pronation/supination and FNF as compared to R LUE Coordination: decreased gross motor;decreased fine motor    Lower Extremity Assessment Lower Extremity Assessment: Defer to PT evaluation LLE Deficits / Details: L LE weakness from prior CVA with decreased coordination as compared to R and limited hip ROM; noted bilateral foot numbness pt reports from h/o frost bite    Cervical / Trunk Assessment Cervical / Trunk Assessment: Normal  Communication  Communication: Expressive difficulties (dysarthria)  Cognition Arousal/Alertness: Awake/alert Behavior During Therapy: WFL for tasks assessed/performed Overall Cognitive Status: History of cognitive impairments - at baseline                                          General Comments General comments (skin integrity, edema, etc.): Educated on imbalance/wooziness as a result of the TIA and need for increased time to adjust to position changes prior to moving to reduce fall risk as well as on need for mobility to progress habituation.  Discussed will have challenges at outpatient to improve balance and hopefully reduce need for walker.  Wife present throughout and also  educated.    Exercises     Assessment/Plan    PT Assessment All further PT needs can be met in the next venue of care;Patient does not need any further PT services  PT Problem List Decreased balance;Decreased coordination;Decreased safety awareness       PT Treatment Interventions      PT Goals (Current goals can be found in the Care Plan section)  Acute Rehab PT Goals Patient Stated Goal: to reduce pain in left hip PT Goal Formulation: All assessment and education complete, DC therapy Time For Goal Achievement: 01/27/23 Potential to Achieve Goals: Good    Frequency Min 3X/week     Co-evaluation               AM-PAC PT "6 Clicks" Mobility  Outcome Measure Help needed turning from your back to your side while in a flat bed without using bedrails?: None Help needed moving from lying on your back to sitting on the side of a flat bed without using bedrails?: None Help needed moving to and from a bed to a chair (including a wheelchair)?: None Help needed standing up from a chair using your arms (e.g., wheelchair or bedside chair)?: None Help needed to walk in hospital room?: None Help needed climbing 3-5 steps with a railing? : A Lot 6 Click Score: 22    End of Session Equipment Utilized During Treatment: Gait belt Activity Tolerance: Patient tolerated treatment well Patient left: in bed;with call bell/phone within reach;with family/visitor present Nurse Communication: Mobility status PT Visit Diagnosis: Other abnormalities of gait and mobility (R26.89);Other symptoms and signs involving the nervous system (R29.898);Dizziness and giddiness (R42) Pain - Right/Left: Left Pain - part of body: Hip    Time: [id number] PT Time Calculation (min) (ACUTE ONLY): 31 min   Charges:   PT Evaluation $PT Re-evaluation: 1 Re-eval PT Treatments $Therapeutic Activity: 23-37 mins $Self Care/Home Management: 8-22        Magda Kiel, PT Acute Rehabilitation  Services Office:[phone] 01/14/2023   Reginia Naas 01/14/2023, 5:18 PM

## 2023-01-14 NOTE — Progress Notes (Addendum)
STROKE TEAM PROGRESS NOTE   INTERVAL HISTORY His wife is at the bedside.  MRI negative. Previous strokes left him with right sided weakness. He is having trouble moving his left leg for several months even prior to his stroke. Spoke with primary team about ordering X-rays.   Vitals:   01/13/23 2015 01/13/23 2347 01/14/23 0326 01/14/23 0749  BP: (!) 174/103 (!) 141/95 (!) 147/98 (!) 138/90  Pulse: 75 79 77 74  Resp: 16 16 14 18  $ Temp: 97.6 F (36.4 C) 98.1 F (36.7 C) 97.9 F (36.6 C) 98.1 F (36.7 C)  TempSrc: Oral Oral  Oral  SpO2: 100% 97% 96% 100%  Weight:      Height:       CBC:  Recent Labs  Lab 01/13/23 1224 01/13/23 1231  WBC 6.5  --   NEUTROABS 4.6  --   HGB 11.7* 12.2*  HCT 36.6* 36.0*  MCV 85.9  --   PLT 245  --    Basic Metabolic Panel:  Recent Labs  Lab 01/13/23 1224 01/13/23 1231  NA 136 140  K 4.0 4.1  CL 102 104  CO2 25  --   GLUCOSE 124* 123*  BUN 25* 26*  CREATININE 1.86* 2.00*  CALCIUM 10.6*  --    Lipid Panel:  Recent Labs  Lab 01/14/23 0318  CHOL 200  TRIG 548*  HDL 28*  CHOLHDL 7.1  VLDL UNABLE TO CALCULATE IF TRIGLYCERIDE OVER 400 mg/dL  LDLCALC UNABLE TO CALCULATE IF TRIGLYCERIDE OVER 400 mg/dL   HgbA1c:  Recent Labs  Lab 01/13/23 1224  HGBA1C 6.4*   Urine Drug Screen:  Recent Labs  Lab 01/13/23 1300  LABOPIA NONE DETECTED  COCAINSCRNUR POSITIVE*  LABBENZ NONE DETECTED  AMPHETMU NONE DETECTED  THCU NONE DETECTED  LABBARB NONE DETECTED    Alcohol Level  Recent Labs  Lab 01/13/23 1224  ETH <10    IMAGING past 24 hours MR BRAIN WO CONTRAST  Addendum Date: 01/13/2023   ADDENDUM REPORT: 01/13/2023 15:29 ADDENDUM: CORRECTION -- exam was done without contrast. Electronically Signed   By: Margaretha Sheffield M.D.   On: 01/13/2023 15:29   Result Date: 01/13/2023 CLINICAL DATA:  Neuro deficit, acute, stroke suspected EXAM: MRI HEAD WITHOUT CONTRAST MRA HEAD WITHOUT CONTRAST TECHNIQUE: Multiplanar, multi-echo pulse  sequences of the brain and surrounding structures were acquired without intravenous contrast. Angiographic images of the Circle of Willis were acquired using MRA technique without intravenous contrast. CONTRAST:  To be added as an addendum. COMPARISON:  None Available. FINDINGS: MRI HEAD FINDINGS Brain: No acute infarction, hemorrhage, hydrocephalus, extra-axial collection or mass lesion. No pathologic intracranial enhancement. Moderate T2/FLAIR hyperintensities in the white matter, compatible with chronic microvascular ischemic disease. Remote perforator infarct in the left basal ganglia. Vascular: See below. Skull and upper cervical spine: Normal marrow signal. Sinuses/Orbits: Clear sinuses.  No acute orbital findings. Other: No mastoid effusions. MRA HEAD FINDINGS Anterior circulation: Bilateral intracranial ICAs are patent with mild to moderate paraclinoid ICAs aerated bilaterally. Bilateral CCA is mid ACAs are patent. Severe distal left ACA stenosis. Moderate distal left MCA stenosis. Posterior circulation: Bilateral intradural vertebral arteries, basilar artery and bilateral posterior cerebral arteries are patent. Severe distal right intradural vertebral artery stenosis. Moderate left proximal P2 PCA stenosis. IMPRESSION: 1. No evidence of acute intracranial abnormality. 2. No emergent large vessel occlusion. 3. Severe distal left ACA stenosis. 4. Moderate left proximal P2 PCA stenosis. 5. Moderate distal left MCA stenosis. 6. Severe distal right intradural vertebral artery  stenosis. Electronically Signed: By: Margaretha Sheffield M.D. On: 01/13/2023 15:21   MR ANGIO HEAD WO CONTRAST  Addendum Date: 01/13/2023   ADDENDUM REPORT: 01/13/2023 15:29 ADDENDUM: CORRECTION -- exam was done without contrast. Electronically Signed   By: Margaretha Sheffield M.D.   On: 01/13/2023 15:29   Result Date: 01/13/2023 CLINICAL DATA:  Neuro deficit, acute, stroke suspected EXAM: MRI HEAD WITHOUT CONTRAST MRA HEAD WITHOUT  CONTRAST TECHNIQUE: Multiplanar, multi-echo pulse sequences of the brain and surrounding structures were acquired without intravenous contrast. Angiographic images of the Circle of Willis were acquired using MRA technique without intravenous contrast. CONTRAST:  To be added as an addendum. COMPARISON:  None Available. FINDINGS: MRI HEAD FINDINGS Brain: No acute infarction, hemorrhage, hydrocephalus, extra-axial collection or mass lesion. No pathologic intracranial enhancement. Moderate T2/FLAIR hyperintensities in the white matter, compatible with chronic microvascular ischemic disease. Remote perforator infarct in the left basal ganglia. Vascular: See below. Skull and upper cervical spine: Normal marrow signal. Sinuses/Orbits: Clear sinuses.  No acute orbital findings. Other: No mastoid effusions. MRA HEAD FINDINGS Anterior circulation: Bilateral intracranial ICAs are patent with mild to moderate paraclinoid ICAs aerated bilaterally. Bilateral CCA is mid ACAs are patent. Severe distal left ACA stenosis. Moderate distal left MCA stenosis. Posterior circulation: Bilateral intradural vertebral arteries, basilar artery and bilateral posterior cerebral arteries are patent. Severe distal right intradural vertebral artery stenosis. Moderate left proximal P2 PCA stenosis. IMPRESSION: 1. No evidence of acute intracranial abnormality. 2. No emergent large vessel occlusion. 3. Severe distal left ACA stenosis. 4. Moderate left proximal P2 PCA stenosis. 5. Moderate distal left MCA stenosis. 6. Severe distal right intradural vertebral artery stenosis. Electronically Signed: By: Margaretha Sheffield M.D. On: 01/13/2023 15:21   VAS US CAROTID (at [facility] and [facility] only)  Result Date: 01/13/2023 Carotid Arterial Duplex Study Patient Name:  Dekoda Franssen.  Date of Exam:   01/13/2023 Medical Rec #: [id number]           Accession #:    [id number] Date of Birth: Nov 03, 1961           Patient Gender: M Patient Age:   46 years Exam Location:   [facility] Procedure:      VAS US CAROTID Referring Phys: Lovey Newcomer --------------------------------------------------------------------------------  Indications:       TIA. Risk Factors:      Hypertension, hyperlipidemia, Diabetes, current smoker, prior                    MI, prior CVA. Comparison Study:  10-08-2020 Carotid duplex showed minimal intimal thickening                    right carotid bulb, minimal plaque left ICA. Performing Technologist: Darlin Coco RDMS, RVT  Examination Guidelines: A complete evaluation includes B-mode imaging, spectral Doppler, color Doppler, and power Doppler as needed of all accessible portions of each vessel. Bilateral testing is considered an integral part of a complete examination. Limited examinations for reoccurring indications may be performed as noted.  Right Carotid Findings: +----------+--------+--------+--------+---------------------+------------------+           PSV cm/sEDV cm/sStenosisPlaque Description   Comments           +----------+--------+--------+--------+---------------------+------------------+ CCA Prox  114     26                                                      +----------+--------+--------+--------+---------------------+------------------+  CCA Distal22                      Abnormal waveforms-  intimal thickening                                   end diastolic                                                             reversal                                +----------+--------+--------+--------+---------------------+------------------+ ICA Prox  28      12                                                      +----------+--------+--------+--------+---------------------+------------------+ ICA Distal42      22                                                      +----------+--------+--------+--------+---------------------+------------------+ ECA       44      15                                                       +----------+--------+--------+--------+---------------------+------------------+ +----------+--------+-------+---------+-------------------+           PSV cm/sEDV cmsDescribe Arm Pressure (mmHG) +----------+--------+-------+---------+-------------------+ Subclavian               Turbulent                    +----------+--------+-------+---------+-------------------+ +---------+--------+--+--------+-+---------+ VertebralPSV cm/s32EDV cm/s9Antegrade +---------+--------+--+--------+-+---------+  Left Carotid Findings: +----------+--------+--------+--------+------------------+------------------+           PSV cm/sEDV cm/sStenosisPlaque DescriptionComments           +----------+--------+--------+--------+------------------+------------------+ CCA Prox  55      16                                                   +----------+--------+--------+--------+------------------+------------------+ CCA Distal32      11                                                   +----------+--------+--------+--------+------------------+------------------+ ICA Prox  41      12              Abnormal waveformsintimal thickening +----------+--------+--------+--------+------------------+------------------+ ICA Distal51      21                                                   +----------+--------+--------+--------+------------------+------------------+  ECA       72      21                                                   +----------+--------+--------+--------+------------------+------------------+ +----------+--------+--------+---------+-------------------+           PSV cm/sEDV cm/sDescribe Arm Pressure (mmHG) +----------+--------+--------+---------+-------------------+ Subclavian108             Turbulent                    +----------+--------+--------+---------+-------------------+ +---------+--------+--+--------+--+---------+ VertebralPSV  cm/s66EDV cm/s28Antegrade +---------+--------+--+--------+--+---------+   Summary: Right Carotid: The extracranial vessels were near-normal with only minimal wall                thickening or plaque. Abnormal waveform morphology- end diastolic                reversal at the distal CCA. Left Carotid: The extracranial vessels were near-normal with only minimal wall               thickening or plaque. Abnormal waveform morphology -proximal left               ICA. Vertebrals:  Bilateral vertebral arteries demonstrate antegrade flow. Subclavians: Bilateral subclavian artery flow was disturbed. *See table(s) above for measurements and observations.  Electronically signed by Servando Snare MD on 01/13/2023 at 3:20:49 PM.    Final    ECHOCARDIOGRAM COMPLETE  Result Date: 01/13/2023    ECHOCARDIOGRAM REPORT   Patient Name:   Anar Escobedo. Date of Exam: 01/13/2023 Medical Rec #:  [id number]          Height:       78.0 in Accession #:    [id number]         Weight:       251.8 lb Date of Birth:  [birth year]          BSA:          2.490 m Patient Age:    45 years           BP:           162/92 mmHg Patient Gender: M                  HR:           66 bpm. Exam Location:  Inpatient Procedure: 2D Echo, Cardiac Doppler and Color Doppler Indications:    TIA  History:        Patient has prior history of Echocardiogram examinations, most                 recent 12/09/2019. Risk Factors:Diabetes, Dyslipidemia, Current                 Smoker and Hypertension. Hx TIA, stroke.  Sonographer:    Clayton Lefort RDCS (AE) Referring Phys: [place]  1. Left ventricular ejection fraction, by estimation, is 30 to 35%. The left ventricle has moderately decreased function. The left ventricle demonstrates global hypokinesis. There is mild concentric left ventricular hypertrophy. Left ventricular diastolic parameters are consistent with Grade I diastolic dysfunction (impaired relaxation).  2. Right ventricular systolic  function is normal. The right ventricular size is normal. Tricuspid regurgitation signal is inadequate for assessing PA pressure.  3. The mitral valve is normal in  structure. Trivial mitral valve regurgitation.  4. The aortic valve is tricuspid. There is mild calcification of the aortic valve. There is mild thickening of the aortic valve. Aortic valve regurgitation is not visualized. Aortic valve sclerosis/calcification is present, without any evidence of aortic stenosis.  5. Aortic dilatation noted. There is borderline dilatation of the aortic root, measuring 38 mm. There is mild dilatation of the ascending aorta, measuring 41 mm.  6. The inferior vena cava is normal in size with greater than 50% respiratory variability, suggesting right atrial pressure of 3 mmHg. Comparison(s): Compared to prior TTE, the EF has decreased from 40-45% to ~35%. Otherwise, there is no significant change. Conclusion(s)/Recommendation(s): No intracardiac source of embolism detected on this transthoracic study. Consider a transesophageal echocardiogram to exclude cardiac source of embolism if clinically indicated. FINDINGS  Left Ventricle: Left ventricular ejection fraction, by estimation, is 30 to 35%. The left ventricle has moderately decreased function. The left ventricle demonstrates global hypokinesis. The left ventricular internal cavity size was normal in size. There is mild concentric left ventricular hypertrophy. Left ventricular diastolic parameters are consistent with Grade I diastolic dysfunction (impaired relaxation). Right Ventricle: The right ventricular size is normal. No increase in right ventricular wall thickness. Right ventricular systolic function is normal. Tricuspid regurgitation signal is inadequate for assessing PA pressure. Left Atrium: Left atrial size was normal in size. Right Atrium: Right atrial size was normal in size. Pericardium: There is no evidence of pericardial effusion. Mitral Valve: The mitral valve  is normal in structure. Trivial mitral valve regurgitation. Tricuspid Valve: The tricuspid valve is normal in structure. Tricuspid valve regurgitation is trivial. Aortic Valve: The aortic valve is tricuspid. There is mild calcification of the aortic valve. There is mild thickening of the aortic valve. Aortic valve regurgitation is not visualized. Aortic valve sclerosis/calcification is present, without any evidence of aortic stenosis. Aortic valve mean gradient measures 4.0 mmHg. Aortic valve peak gradient measures 7.3 mmHg. Aortic valve area, by VTI measures 2.31 cm. Pulmonic Valve: The pulmonic valve was not well visualized. Aorta: Aortic dilatation noted. There is borderline dilatation of the aortic root, measuring 38 mm. There is mild dilatation of the ascending aorta, measuring 41 mm. Venous: The inferior vena cava is normal in size with greater than 50% respiratory variability, suggesting right atrial pressure of 3 mmHg. IAS/Shunts: The atrial septum is grossly normal.  LEFT VENTRICLE PLAX 2D LVIDd:         4.70 cm      Diastology LVIDs:         4.20 cm      LV e' medial:    4.57 cm/s LV PW:         1.10 cm      LV E/e' medial:  9.5 LV IVS:        1.10 cm      LV e' lateral:   5.55 cm/s LVOT diam:     2.30 cm      LV E/e' lateral: 7.8 LV SV:         65 LV SV Index:   26 LVOT Area:     4.15 cm  LV Volumes (MOD) LV vol d, MOD A2C: 220.0 ml LV vol d, MOD A4C: 281.0 ml LV vol s, MOD A2C: 141.0 ml LV vol s, MOD A4C: 188.0 ml LV SV MOD A2C:     79.0 ml LV SV MOD A4C:     281.0 ml LV SV MOD BP:      88.3  ml RIGHT VENTRICLE             IVC RV Basal diam:  3.50 cm     IVC diam: 2.20 cm RV Mid diam:    3.60 cm RV S prime:     11.10 cm/s TAPSE (M-mode): 1.9 cm LEFT ATRIUM             Index        RIGHT ATRIUM           Index LA diam:        2.90 cm 1.16 cm/m   RA Area:     24.50 cm LA Vol (A2C):   72.7 ml 29.20 ml/m  RA Volume:   85.10 ml  34.18 ml/m LA Vol (A4C):   66.8 ml 26.83 ml/m LA Biplane Vol: 70.6 ml 28.36  ml/m  AORTIC VALVE AV Area (Vmax):    2.63 cm AV Area (Vmean):   2.21 cm AV Area (VTI):     2.31 cm AV Vmax:           135.00 cm/s AV Vmean:          99.200 cm/s AV VTI:            0.282 m AV Peak Grad:      7.3 mmHg AV Mean Grad:      4.0 mmHg LVOT Vmax:         85.50 cm/s LVOT Vmean:        52.800 cm/s LVOT VTI:          0.157 m LVOT/AV VTI ratio: 0.56  AORTA Ao Root diam: 3.80 cm Ao Asc diam:  4.10 cm MITRAL VALVE MV Area (PHT): 2.32 cm    SHUNTS MV Decel Time: 327 msec    Systemic VTI:  0.16 m MV E velocity: 43.30 cm/s  Systemic Diam: 2.30 cm MV A velocity: 71.60 cm/s MV E/A ratio:  0.60 Gwyndolyn Kaufman MD Electronically signed by Gwyndolyn Kaufman MD Signature Date/Time: 01/13/2023/2:34:05 PM    Final    CT HEAD CODE STROKE WO CONTRAST  Result Date: 01/13/2023 CLINICAL DATA:  Code stroke.  Slurred speech. EXAM: CT HEAD WITHOUT CONTRAST TECHNIQUE: Contiguous axial images were obtained from the base of the skull through the vertex without intravenous contrast. RADIATION DOSE REDUCTION: This exam was performed according to the departmental dose-optimization program which includes automated exposure control, adjustment of the mA and/or kV according to patient size and/or use of iterative reconstruction technique. COMPARISON:  CT head 10/30/2021 FINDINGS: Brain: There is no acute intracranial hemorrhage, extra-axial fluid collection, or acute infarct. Parenchymal volume is within normal limits. The ventricles are normal in size. Remote infarcts in the bilateral basal ganglia/corona radiata and left aspect of the corpus callosum and background chronic small-vessel ischemic change are noted. The pituitary and suprasellar region are normal. There is no mass lesion. There is no mass effect or midline shift. Vascular: No dense vessel is seen. There is calcification of the bilateral carotid siphons and vertebral arteries. Skull: Choose Sinuses/Orbits: The paranasal sinuses are clear. The globes and orbits are  unremarkable. Other: None. ASPECTS [facility] Stroke Program Early CT Score) - Ganglionic level infarction (caudate, lentiform nuclei, internal capsule, insula, M1-M3 cortex): 7 - Supraganglionic infarction (M4-M6 cortex): 3 Total score (0-10 with 10 being normal): 10 IMPRESSION: 1. No acute intracranial pathology. 2. ASPECTS is 10 Code stroke imaging results were communicated on 01/13/2023 at 12:39 pm to provider Dr. Erlinda Hong. Electronically Signed   By: Collier Salina  Noone M.D.   On: 01/13/2023 12:40    PHYSICAL EXAM  Physical Exam  Constitutional: Appears well-developed and well-nourished middle-age African-American male.   Cardiovascular: Normal rate and regular rhythm.  Respiratory: Effort normal, non-labored breathing  Neuro: Mental Status: Patient is awake, alert, oriented to person, place, month, year, and situation. Patient is able to give a clear and coherent history. No signs of aphasia or neglect Cranial Nerves: II: Visual Fields are full. Pupils are equal, round, and reactive to light.   III,IV, VI: EOMI without ptosis or diploplia.  V: Facial sensation is symmetric to temperature VII: Facial movement is symmetric resting and smiling VIII: Hearing is intact to voice X: Palate elevates symmetrically XI: Shoulder shrug is symmetric. XII: Tongue protrudes midline without atrophy or fasciculations.  Motor: Tone is normal. Bulk is normal.  RUE 4+/5 LUE 5/5 RLE 4+/5 LLE limited by pain and can move only minimally Sensory: Sensation is symmetric to light touch and temperature in the arms and legs. No extinction to DSS present.  Cerebellar: FNF and HKS are intact bilaterally Slowed on the right  Left arm orbits right         ASSESSMENT/PLAN Mr. Valen Landwehr. is a 62 y.o. male with history of  DM, hyperlipidemia hypertension MI 1997 sleep apnea stroke 2014 with some residual right-sided weakness and some slurred speech who presents as a code stroke PE BIBEMS from patient's wife's  doctor's office.   Worsening of right-sided old deficits in the setting of cocaine and hypertensive urgency .  MRI negative for acute stroke Code Stroke CT head No acute abnormality. Small vessel disease. Atrophy. ASPECTS 10.    MRI/ MRA- No evidence of acute intracranial abnormality. No emergent large vessel occlusion. Severe distal left ACA stenosis. Moderate left proximal P2 PCA stenosis. Moderate distal left MCA stenosis. Severe distal right intradural vertebral artery stenosis. 2D Echo EF 30-35% Carotid US-right carotid: Extracranial vessels were near normal with only minimal wall thickening or plaque.  Abnormal waveform morphology-end-diastolic reversal at the distal CCA Left carotid: Extracranial vessels were near normal with only minimal wall thickening or plaque.  Abnormal waveforms morphology-proximal left ICA Bilateral subclavian artery flow was disturbed LDL 107.9 HgbA1c 6.4 VTE prophylaxis -Lovenox    Diet   Diet Carb Modified Fluid consistency: Thin; Room service appropriate? Yes   aspirin 81 mg daily prior to admission, now on aspirin 81 mg daily and clopidogrel 75 mg daily for 3 weeks and then plavix alone Therapy recommendations:  Outpatient therapy  Disposition: Pending  Hypertension Home meds: Ramipril Stable Permissive hypertension (OK if < 220/120) but gradually normalize in 5-7 days Long-term BP goal normotensive  Hyperlipidemia LDL 107.9, goal < 70 Add atorvastatin Continue statin at discharge  Diabetes type II Controlled Home med: Jardiance HgbA1c 6.4, goal < 7.0 CBGs Recent Labs    01/13/23 1739 01/13/23 2139 01/14/23 0606  GLUCAP 87 107* 134*    SSI  Other Stroke Risk Factors Advanced Age >/= 13  Cigarette smoker, advised to stop smoking Using nicotine patch Substance abuse - UDS:  THC NONE DETECTED, Cocaine POSITIVE. Patient advised to stop using due to stroke risk. Hx stroke/TIA Coronary artery disease Obstructive sleep apnea, on CPAP at  home Congestive heart failure Jardiance  Other Active Problems Left leg pain X-rays ordered Patient states he is currently in physical therapy for his back Continue gabapentin, Zanaflex as needed CKD stage IIIb Normocytic anemia  Hospital day # 0  Patient seen and examined by NP/APP  with MD. MD to update note as needed.   Janine Ores, DNP, FNP-BC [facility] Pager: [phone] I have personally obtained history,examined this patient, reviewed notes, independently viewed imaging studies, participated in medical decision making and plan of care.ROS completed by me personally and pertinent positives fully documented  I have made any additions or clarifications directly to the above note. Agree with note above.  Patient with previous left hemispheric stroke with right hemiparesis presents with worsening of deficits in the setting of cocaine abuse and hypertensive urgency.  MRI is negative for acute stroke.  He also has left leg weakness secondary to left hip pain which needs evaluation.  Patient counseled to make healthy lifestyle changes and quit cocaine and smoking cigarettes.  Aspirin and Plavix for 3 weeks followed by Plavix alone and aggressive risk factor modification.  Check x-rays of left hip.  Long discussion with patient and wife and answered questions.  Discussed with Dr. Thereasa Solo.  Greater than 50% time during this 50-minute visit was spent in counseling and coordination of care about his old stroke and right-sided deficits and discussion evaluation and treatment and answering questions.  Antony Contras, MD Medical Director [facility] Pager: [phone] 01/14/2023 3:09 PM  To contact Stroke Continuity provider, please refer to [email or web address] After hours, contact General Neurology

## 2023-01-15 ENCOUNTER — Other Ambulatory Visit (HOSPITAL_COMMUNITY): Payer: Self-pay

## 2023-01-15 DIAGNOSIS — G459 Transient cerebral ischemic attack, unspecified: Secondary | ICD-10-CM | POA: Diagnosis not present

## 2023-01-15 LAB — GLUCOSE, CAPILLARY
Glucose-Capillary: 129 mg/dL — ABNORMAL HIGH (ref 70–99)
Glucose-Capillary: 146 mg/dL — ABNORMAL HIGH (ref 70–99)

## 2023-01-15 MED ORDER — ASPIRIN 81 MG PO TBEC: 81.0000 mg | DELAYED_RELEASE_TABLET | Freq: Every day | ORAL | 0 refills | Status: AC

## 2023-01-15 MED ORDER — ATORVASTATIN CALCIUM 40 MG PO TABS
40.0000 mg | ORAL_TABLET | Freq: Every day | ORAL | 2 refills | Status: DC
  Filled 2023-01-15: qty 30, 30d supply, fill #0

## 2023-01-15 MED ORDER — CLOPIDOGREL BISULFATE 75 MG PO TABS
75.0000 mg | ORAL_TABLET | Freq: Every day | ORAL | 2 refills | Status: DC
  Filled 2023-01-15: qty 30, 30d supply, fill #0

## 2023-01-15 NOTE — Progress Notes (Signed)
Mobility Specialist: Progress Note   01/15/23 1355  Mobility  Activity Ambulated with assistance in hallway  Level of Assistance Standby assist, set-up cues, supervision of patient - no hands on  Assistive Device Front wheel walker  Distance Ambulated (ft) 250 ft  Activity Response Tolerated well  Mobility Referral Yes  $Mobility charge 1 Mobility   Pt received in the bed and agreeable to mobility. Mod I with bed mobility and standby assist during ambulation. C/o mild dizziness at beginning of session but progressed better throughout. Chronic LLE pain. Pt back to bed after session with call bell and phone at his side.   [place] Shawna Wearing Mobility Specialist Please contact via SecureChat or Rehab office at [phone]

## 2023-01-15 NOTE — TOC Transition Note (Signed)
Transition of Care [facility]) - CM/SW Discharge Note   Patient Details  Name: Raymond Barrera. MRN: [id number] Date of Birth: [birth year]  Transition of Care [facility]) CM/SW Contact:  Verdell Carmine, RN Phone Number: 01/15/2023, 11:22 AM   Clinical Narrative:     Referral sent for OP PT OT SLP. To [facility] neuro. The patient lives in [place] No further needs identified. Information placed on Patient instruction sheet.  Final next level of care: Home/Self Care Barriers to Discharge: No Barriers Identified   Patient Goals and CMS Choice      Discharge Placement                         Discharge Plan and Services Additional resources added to the After Visit Summary for                            [facility] Arranged:  (OP PT OT SLP)          Social Determinants of Health (SDOH) Interventions SDOH Screenings   Food Insecurity: No Food Insecurity (01/13/2023)  Housing: Low Risk  (01/13/2023)  Transportation Needs: No Transportation Needs (01/13/2023)  Utilities: Not At Risk (01/13/2023)  Tobacco Use: High Risk (01/13/2023)     Readmission Risk Interventions     No data to display

## 2023-01-15 NOTE — Plan of Care (Signed)
  Problem: Education: Goal: Knowledge of disease or condition will improve Outcome: Progressing   Problem: Coping: Goal: Will verbalize positive feelings about self Outcome: Progressing   Problem: Self-Care: Goal: Ability to participate in self-care as condition permits will improve Outcome: Progressing   Problem: Nutrition: Goal: Adequate nutrition will be maintained Outcome: Progressing

## 2023-01-15 NOTE — Discharge Summary (Addendum)
DISCHARGE SUMMARY  Raymond Barrera.  MR#: [id number]  DOB:12/04/1960  Date of Admission: 01/13/2023 Date of Discharge: 01/15/2023  Attending Physician:Nuha Degner Hennie Duos, MD  Patient's YE:[id number], Raymond Olden, MD  Consults: Stroke Team   Disposition: D/C home   Follow-up Appts:  Follow-up [place] Follow up.   Specialty: Rehabilitation Why: PT OT and Speech, they will call you to set up a time/date Contact information: [address] [place]        Dulce Sellar, MD Follow up in 1 week(s).   Specialty: General Practice Contact information: [address] [place] [address] [phone]                 Discharge Diagnoses: Acute TIA with history of CVA v/s worsening of chronic right deficits due to cocaine induced hypertensive urgency HTN Cocaine and tobacco abuse Progressive systolic CHF DM2 Normocytic anemia Chronic gait disturbance CKD stage IIIb HLD  Follow Up Issues: -patient would benefit from outpatient Cardiology referral for progressive systolic CHF - I placed and order for an ambulatory referral  -assess tolerance to newly initiated lipitor  -assess BP and DM control -question on avoidance of cocaine and tobacco   Initial presentation: 62 year old with a history of HTN, HLD, CAD, 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  The patient admits to occasional cocaine use and ongoing tobacco abuse.  At presentation a code stroke was called.  CT of the brain did not reveal any acute abnormality.   Hospital Course:  Acute TIA with history of CVA v/s worsening of chronic right deficits due to cocaine induced hypertensive urgency -MRI brain  without evidence of acute intracranial abnormality -MRa head/neck noted no acute large vessel occlusion -TTE noted EF 30-35% with global hypokinesis and grade 1 DD but no intracardiac source of embolism detected -Carotid US - extracranial vessels were near normal with only minimal wall thickening or plaque.  Abnormal waveform morphology-end-diastolic reversal at the distal CCA -medical treatment: DAPT x 3 weeks then plavix alone  -LDL 108 -A1c 6.4 -PT/OT/SLP all recommend outpatient ongoing therapy   HTN Reasonably controlled at present   Cocaine and tobacco abuse Patient has been counseled on the direct link between cocaine tobacco abuse and blood vessel disease/stroke/heart attack -he has been informed he must stop both of these substances immediately and entirely   Progressive systolic CHF TTE this admission noted EF 30-35% with global hypokinesis - euvolemic on exam at present -will need outpatient follow-up and workup -TTE 2021 noted EF 40-45% with global hypokinesis   DM2 A1c 6.4 -CBG presently well-controlled   Normocytic anemia Hemoglobin stable   Chronic gait disturbance Utilizes cane at baseline   CKD stage IIIb Baseline creatinine approximately 1.8   HLD Atorvastatin added as it does not appear he was on medication prior to admission    Allergies as of 01/15/2023       Reactions   Poison Ivy Extract [poison Ivy Extract] Itching, Rash        Medication List     STOP taking these medications    traMADol 50 MG tablet Commonly known as: ULTRAM       TAKE these medications    aspirin EC 81 MG tablet Take 1 tablet (  81 mg total) by mouth daily for 21 days. Swallow whole.   atorvastatin 40 MG tablet Commonly known as: LIPITOR Take 1 tablet (40 mg total) by mouth daily. Start taking on: January 16, 2023   clopidogrel 75 MG tablet Commonly known as: PLAVIX Take 1 tablet (75 mg total) by mouth daily. Start taking on: January 16, 2023   gabapentin  100 MG capsule Commonly known as: NEURONTIN Take 100 mg by mouth 2 (two) times daily.   glucose 4 GM chewable tablet Chew 1 tablet by mouth as needed for low blood sugar.   Jardiance 10 MG Tabs tablet Generic drug: empagliflozin Take 10 mg by mouth daily.   lidocaine 5 % Commonly known as: Lidoderm Place 1 patch onto the skin daily. Remove & Discard patch within 12 hours or as directed by MD What changed:  when to take this reasons to take this   ondansetron 4 MG tablet Commonly known as: Zofran Take 1 tablet (4 mg total) by mouth every 8 (eight) hours as needed for nausea or vomiting.   ramipril 10 MG capsule Commonly known as: ALTACE Take 10 mg by mouth daily.   tiZANidine 4 MG tablet Commonly known as: ZANAFLEX Take 0.5-1 tablets (2-4 mg total) by mouth 2 (two) times daily as needed for muscle spasms.        Day of Discharge BP (!) 154/106 (BP Location: Left Arm)   Pulse 80   Temp 97.7 F (36.5 C) (Oral)   Resp 18   Ht 6' 6"$  (1.981 m)   Wt 114.2 kg   SpO2 98%   BMI 29.09 kg/m   Physical Exam: General: No acute respiratory distress Lungs: Clear to auscultation bilaterally without wheezes or crackles Cardiovascular: Regular rate and rhythm without murmur gallop or rub normal S1 and S2 Abdomen: Nontender, nondistended, soft, bowel sounds positive, no rebound, no ascites, no appreciable mass Extremities: No significant cyanosis, clubbing, or edema bilateral lower extremities  Basic Metabolic Panel: Recent Labs  Lab 01/13/23 1224 01/13/23 1231  NA 136 140  K 4.0 4.1  CL 102 104  CO2 25  --   GLUCOSE 124* 123*  BUN 25* 26*  CREATININE 1.86* 2.00*  CALCIUM 10.6*  --    CBC: Recent Labs  Lab 01/13/23 1224 01/13/23 1231  WBC 6.5  --   NEUTROABS 4.6  --   HGB 11.7* 12.2*  HCT 36.6* 36.0*  MCV 85.9  --   PLT 245  --     Time spent in discharge (includes decision making & examination of pt): 35 minutes  01/15/2023, 12:00 PM   Cherene Altes, MD [facility] Office  [phone]

## 2023-01-15 NOTE — Progress Notes (Signed)
Discharged to home, discharge instructions given and dropped to the car.

## 2023-01-15 NOTE — Progress Notes (Signed)
STROKE TEAM PROGRESS NOTE   INTERVAL HISTORY His wife is at the bedside.   Patient is doing better and was able to walk to the restroom.  Hip x-rays show no significant abnormalities.  Vitals:   01/15/23 0000 01/15/23 0500 01/15/23 0837 01/15/23 1140  BP: (!) 147/89 (!) 150/90 (!) 142/95 (!) 154/106  Pulse: 72 74 67 80  Resp: 17 16 18 18  $ Temp: 98.1 F (36.7 C) 97.9 F (36.6 C) (!) 97.5 F (36.4 C) 97.7 F (36.5 C)  TempSrc: Oral Oral Oral Oral  SpO2: 99% 98% 98% 98%  Weight:      Height:       CBC:  Recent Labs  Lab 01/13/23 1224 01/13/23 1231  WBC 6.5  --   NEUTROABS 4.6  --   HGB 11.7* 12.2*  HCT 36.6* 36.0*  MCV 85.9  --   PLT 245  --    Basic Metabolic Panel:  Recent Labs  Lab 01/13/23 1224 01/13/23 1231  NA 136 140  K 4.0 4.1  CL 102 104  CO2 25  --   GLUCOSE 124* 123*  BUN 25* 26*  CREATININE 1.86* 2.00*  CALCIUM 10.6*  --    Lipid Panel:  Recent Labs  Lab 01/14/23 0318  CHOL 200  TRIG 548*  HDL 28*  CHOLHDL 7.1  VLDL UNABLE TO CALCULATE IF TRIGLYCERIDE OVER 400 mg/dL  LDLCALC UNABLE TO CALCULATE IF TRIGLYCERIDE OVER 400 mg/dL   HgbA1c:  Recent Labs  Lab 01/13/23 1224  HGBA1C 6.4*   Urine Drug Screen:  Recent Labs  Lab 01/13/23 1300  LABOPIA NONE DETECTED  COCAINSCRNUR POSITIVE*  LABBENZ NONE DETECTED  AMPHETMU NONE DETECTED  THCU NONE DETECTED  LABBARB NONE DETECTED    Alcohol Level  Recent Labs  Lab 01/13/23 1224  ETH <10    IMAGING past 24 hours DG HIP UNILAT WITH PELVIS 2-3 VIEWS LEFT  Result Date: 01/14/2023 CLINICAL DATA:  Acute on chronic left hip pain without recent injury. EXAM: DG HIP (WITH OR WITHOUT PELVIS) 2-3V LEFT COMPARISON:  January 27, 2019. FINDINGS: Status post bilateral total hip arthroplasties. Surgical posterior fusion of lower lumbar spine is noted as well. No acute fracture or dislocation is noted. IMPRESSION: No acute abnormality seen. Electronically Signed   By: Marijo Conception M.D.   On: 01/14/2023  18:35    PHYSICAL EXAM  Physical Exam  Constitutional: Appears well-developed and well-nourished middle-age African-American male.   Cardiovascular: Normal rate and regular rhythm.  Respiratory: Effort normal, non-labored breathing  Neuro: Mental Status: Patient is awake, alert, oriented to person, place, month, year, and situation. Patient is able to give a clear and coherent history. No signs of aphasia or neglect Cranial Nerves: II: Visual Fields are full. Pupils are equal, round, and reactive to light.   III,IV, VI: EOMI without ptosis or diploplia.  V: Facial sensation is symmetric to temperature VII: Facial movement is symmetric resting and smiling VIII: Hearing is intact to voice X: Palate elevates symmetrically XI: Shoulder shrug is symmetric. XII: Tongue protrudes midline without atrophy or fasciculations.  Motor: Tone is normal. Bulk is normal.  RUE 4+/5 LUE 5/5 RLE 4+/5 LLE limited by pain and can move only minimally Sensory: Sensation is symmetric to light touch and temperature in the arms and legs. No extinction to DSS present.  Cerebellar: FNF and HKS are intact bilaterally Slowed on the right  Left arm orbits right         ASSESSMENT/PLAN Raymond Barrera. is a 62 y.o. male with history of  DM, hyperlipidemia hypertension MI 1997 sleep apnea stroke 2014 with some residual right-sided weakness and some slurred speech who presents as a code stroke PE BIBEMS from patient's wife's doctor's office.   Worsening of right-sided old deficits in the setting of cocaine and hypertensive urgency .  MRI negative for acute stroke Code Stroke CT head No acute abnormality. Small vessel disease. Atrophy. ASPECTS 10.    MRI/ MRA- No evidence of acute intracranial abnormality. No emergent large vessel occlusion. Severe distal left ACA stenosis. Moderate left proximal P2 PCA stenosis. Moderate distal left MCA stenosis. Severe distal right intradural vertebral artery  stenosis. 2D Echo EF 30-35% Carotid US-right carotid: Extracranial vessels were near normal with only minimal wall thickening or plaque.  Abnormal waveform morphology-end-diastolic reversal at the distal CCA Left carotid: Extracranial vessels were near normal with only minimal wall thickening or plaque.  Abnormal waveforms morphology-proximal left ICA Bilateral subclavian artery flow was disturbed LDL 107.9 HgbA1c 6.4 VTE prophylaxis -Lovenox    Diet   Diet Carb Modified Fluid consistency: Thin; Room service appropriate? Yes   aspirin 81 mg daily prior to admission, now on aspirin 81 mg daily and clopidogrel 75 mg daily for 3 weeks and then plavix alone Therapy recommendations:  Outpatient therapy  Disposition: Pending  Hypertension Home meds: Ramipril Stable Permissive hypertension (OK if < 220/120) but gradually normalize in 5-7 days Long-term BP goal normotensive  Hyperlipidemia LDL 107.9, goal < 70 Add atorvastatin Continue statin at discharge  Diabetes type II Controlled Home med: Jardiance HgbA1c 6.4, goal < 7.0 CBGs Recent Labs    01/14/23 2104 01/15/23 0633 01/15/23 1141  GLUCAP 121* 129* 146*    SSI  Other Stroke Risk Factors Advanced Age >/= 50  Cigarette smoker, advised to stop smoking Using nicotine patch Substance abuse - UDS:  THC NONE DETECTED, Cocaine POSITIVE. Patient advised to stop using due to stroke risk. Hx stroke/TIA Coronary artery disease Obstructive sleep apnea, on CPAP at home Congestive heart failure Jardiance  Other Active Problems Left leg pain X-rays ordered Patient states he is currently in physical therapy for his back Continue gabapentin, Zanaflex as needed CKD stage IIIb Normocytic anemia  Hospital day # 0  Patient with previous left hemispheric stroke with right hemiparesis presents with worsening of deficits in the setting of cocaine abuse and hypertensive urgency.  MRI is negative for acute stroke.  He also has left leg  weakness secondary to left hip pain which appears chronic and hip x-rays are unremarkable and can be followed up as an outpatient with his back specialist that he is seen.  Patient counseled to make healthy lifestyle changes and quit cocaine and smoking cigarettes.  Aspirin and Plavix for 3 weeks followed by Plavix alone and aggressive risk factor modification.  Check x-rays of left hip.  Long discussion with patient and wife and answered questions.  Discussed with Dr. Thereasa Solo.  Greater than 50% time during this 35-minute visit was spent in counseling and coordination of care about his old stroke and right-sided deficits and discussion evaluation and treatment and answering questions.  Stroke team will sign off.  Kindly call for questions  Antony Contras, MD Medical Director [place] Pager: [phone] 01/15/2023 12:49 PM  To contact Stroke Continuity provider, please refer to [email or web address] After hours, contact General Neurology

## 2023-02-04 ENCOUNTER — Other Ambulatory Visit: Payer: Self-pay

## 2023-02-04 ENCOUNTER — Emergency Department (HOSPITAL_COMMUNITY): Payer: Medicaid Other

## 2023-02-04 ENCOUNTER — Encounter (HOSPITAL_COMMUNITY): Payer: Self-pay | Admitting: *Deleted

## 2023-02-04 ENCOUNTER — Emergency Department (HOSPITAL_COMMUNITY)
Admission: EM | Admit: 2023-02-04 | Discharge: 2023-02-04 | Disposition: A | Discharge status: Home / Self Care | Payer: Medicaid Other | Attending: Emergency Medicine | Admitting: Emergency Medicine

## 2023-02-04 DIAGNOSIS — R42 Dizziness and giddiness: Secondary | ICD-10-CM | POA: Diagnosis present

## 2023-02-04 DIAGNOSIS — Z7982 Long term (current) use of aspirin: Secondary | ICD-10-CM | POA: Diagnosis not present

## 2023-02-04 DIAGNOSIS — I160 Hypertensive urgency: Secondary | ICD-10-CM | POA: Insufficient documentation

## 2023-02-04 DIAGNOSIS — Z7984 Long term (current) use of oral hypoglycemic drugs: Secondary | ICD-10-CM | POA: Insufficient documentation

## 2023-02-04 DIAGNOSIS — Z7902 Long term (current) use of antithrombotics/antiplatelets: Secondary | ICD-10-CM | POA: Insufficient documentation

## 2023-02-04 LAB — CBC
HCT: 33.5 % — ABNORMAL LOW (ref 39.0–52.0)
Hemoglobin: 10.4 g/dL — ABNORMAL LOW (ref 13.0–17.0)
MCH: 26.9 pg (ref 26.0–34.0)
MCHC: 31 g/dL (ref 30.0–36.0)
MCV: 86.8 fL (ref 80.0–100.0)
Platelets: 282 10*3/uL (ref 150–400)
RBC: 3.86 MIL/uL — ABNORMAL LOW (ref 4.22–5.81)
RDW: 15.9 % — ABNORMAL HIGH (ref 11.5–15.5)
WBC: 7.1 10*3/uL (ref 4.0–10.5)
nRBC: 0 % (ref 0.0–0.2)

## 2023-02-04 LAB — URINALYSIS, ROUTINE W REFLEX MICROSCOPIC
Bacteria, UA: NONE SEEN
Bilirubin Urine: NEGATIVE
Glucose, UA: >=500 mg/dL — AB
Hgb urine dipstick: NEGATIVE
Ketones, ur: NEGATIVE mg/dL
Leukocytes,Ua: NEGATIVE
Nitrite: NEGATIVE
Protein, ur: 100 mg/dL — AB
Specific Gravity, Urine: 1.021 (ref 1.005–1.030)
pH: 5 (ref 5.0–8.0)

## 2023-02-04 LAB — COMPREHENSIVE METABOLIC PANEL
ALT: 16 U/L (ref 0–44)
AST: 15 U/L (ref 15–41)
Albumin: 3.8 g/dL (ref 3.5–5.0)
Alkaline Phosphatase: 71 U/L (ref 38–126)
Anion gap: 7 (ref 5–15)
BUN: 32 mg/dL — ABNORMAL HIGH (ref 8–23)
CO2: 22 mmol/L (ref 22–32)
Calcium: 10.4 mg/dL — ABNORMAL HIGH (ref 8.9–10.3)
Chloride: 106 mmol/L (ref 98–111)
Creatinine, Ser: 1.82 mg/dL — ABNORMAL HIGH (ref 0.61–1.24)
GFR, Estimated: 41 mL/min — ABNORMAL LOW (ref >60)
Glucose, Bld: 144 mg/dL — ABNORMAL HIGH (ref 70–99)
Potassium: 4.3 mmol/L (ref 3.5–5.1)
Sodium: 135 mmol/L (ref 135–145)
Total Bilirubin: 0.7 mg/dL (ref 0.3–1.2)
Total Protein: 7.4 g/dL (ref 6.5–8.1)

## 2023-02-04 LAB — DIFFERENTIAL
Abs Immature Granulocytes: 0.02 10*3/uL (ref 0.00–0.07)
Basophils Absolute: 0 10*3/uL (ref 0.0–0.1)
Basophils Relative: 0 %
Eosinophils Absolute: 0.1 10*3/uL (ref 0.0–0.5)
Eosinophils Relative: 1 %
Immature Granulocytes: 0 %
Lymphocytes Relative: 20 %
Lymphs Abs: 1.4 10*3/uL (ref 0.7–4.0)
Monocytes Absolute: 0.4 10*3/uL (ref 0.1–1.0)
Monocytes Relative: 6 %
Neutro Abs: 5.1 10*3/uL (ref 1.7–7.7)
Neutrophils Relative %: 73 %

## 2023-02-04 LAB — APTT: aPTT: 29 seconds (ref 24–36)

## 2023-02-04 LAB — RAPID URINE DRUG SCREEN, HOSP PERFORMED
Amphetamines: NOT DETECTED
Barbiturates: NOT DETECTED
Benzodiazepines: NOT DETECTED
Cocaine: POSITIVE — AB
Opiates: NOT DETECTED
Tetrahydrocannabinol: NOT DETECTED

## 2023-02-04 LAB — PROTIME-INR
INR: 1 (ref 0.8–1.2)
Prothrombin Time: 13.1 seconds (ref 11.4–15.2)

## 2023-02-04 LAB — ETHANOL: Alcohol, Ethyl (B): <10 mg/dL (ref <10)

## 2023-02-04 MED ORDER — SODIUM CHLORIDE 0.9 % IV BOLUS
500.0000 mL | Freq: Once | INTRAVENOUS | Status: AC
  Administered 2023-02-04: 500 mL via INTRAVENOUS

## 2023-02-04 MED ORDER — CHLORTHALIDONE 15 MG PO TABS: 15.0000 mg | ORAL_TABLET | Freq: Every day | ORAL | 0 refills | Status: DC

## 2023-02-04 MED ORDER — SODIUM CHLORIDE 0.9 % IV SOLN: 100.0000 mL/h | INTRAVENOUS | Status: DC

## 2023-02-04 NOTE — ED Notes (Signed)
No changes in assessment.  Pt standing in room and doing well    will DC in [facility]

## 2023-02-04 NOTE — ED Notes (Signed)
Pt eating ice.  Will let the complete 500cc go in due to water stating he doesn't drink good at home  Dispo dc in

## 2023-02-04 NOTE — Discharge Instructions (Signed)
As discussed, today's evaluation has been generally reassuring.  Your MRI did not demonstrate any evidence for a new stroke, and your labs were generally reassuring and consistent with poor prior studies. However, with your persistently mildly elevated blood pressure you are starting a new medication.  Please discuss this with your physician tomorrow on follow-up.  Return here for concerning changes in your condition.

## 2023-02-04 NOTE — ED Notes (Signed)
Patient transported to MRI 

## 2023-02-04 NOTE — ED Provider Notes (Signed)
[facility] Provider Note   CSN: [id number] Arrival date & time: 02/04/23  1301     History  Chief Complaint  Patient presents with   Hypertension    Raymond Barrera. is a 62 y.o. male.  HPI Presents with his wife who assists with the history.  Patient presents several weeks after discharge from our affiliated facility now with concern for occasional shakiness, dizziness, intermittent weakness in his extremities, seemingly left greater than right.  Patient has recently started Cymbalta, notes that he has no longer using cocaine, alcohol is taking all medication including aspirin, Plavix as directed.    Home Medications Prior to Admission medications   Medication Sig Start Date End Date Taking? Authorizing Provider  chlorthalidone (HYGROTEN) 15 MG tablet Take 1 tablet (15 mg total) by mouth daily. 02/04/23 03/06/23 Yes Carmin Muskrat, MD  aspirin EC 81 MG tablet Take 1 tablet (81 mg total) by mouth daily for 21 days. Swallow whole. 01/15/23 02/05/23  Cherene Altes, MD  atorvastatin (LIPITOR) 40 MG tablet Take 1 tablet (40 mg total) by mouth daily. 01/16/23   Cherene Altes, MD  clopidogrel (PLAVIX) 75 MG tablet Take 1 tablet (75 mg total) by mouth daily. 01/16/23   Cherene Altes, MD  gabapentin (NEURONTIN) 100 MG capsule Take 100 mg by mouth 2 (two) times daily.    [provider]  glucose 4 GM chewable tablet Chew 1 tablet by mouth as needed for low blood sugar.    [provider]  JARDIANCE 10 MG TABS tablet Take 10 mg by mouth daily. 11/27/22   [provider]  lidocaine (LIDODERM) 5 % Place 1 patch onto the skin daily. Remove & Discard patch within 12 hours or as directed by MD Patient taking differently: Place 1 patch onto the skin daily as needed (PAIN). Remove & Discard patch within 12 hours or as directed by MD 07/10/19   Langston Masker B, PA-C  ondansetron (ZOFRAN) 4 MG tablet Take 1 tablet (4 mg  total) by mouth every 8 (eight) hours as needed for nausea or vomiting. 03/30/19   Debbrah Alar, PA-C  ramipril (ALTACE) 10 MG capsule Take 10 mg by mouth daily. 11/27/22   [provider]  tiZANidine (ZANAFLEX) 4 MG tablet Take 0.5-1 tablets (2-4 mg total) by mouth 2 (two) times daily as needed for muscle spasms. 05/11/20   Meredith Pel, MD      Allergies    Poison ivy extract [poison ivy extract]    Review of Systems   Review of Systems  All other systems reviewed and are negative.   Physical Exam Updated Vital Signs BP (!) 151/109   Pulse 71   Temp 97.9 F (36.6 C) (Oral)   Resp 18   SpO2 98%  Physical Exam Vitals and nursing note reviewed.  Constitutional:      General: He is not in acute distress.    Appearance: He is well-developed.  HENT:     Head: Normocephalic and atraumatic.  Eyes:     Conjunctiva/sclera: Conjunctivae normal.  Cardiovascular:     Rate and Rhythm: Normal rate and regular rhythm.  Pulmonary:     Effort: Pulmonary effort is normal. No respiratory distress.     Breath sounds: No stridor.  Abdominal:     General: There is no distension.  Skin:    General: Skin is warm and dry.  Neurological:     Mental Status: He is  alert and oriented to person, place, and time.     Comments: Subjective, not objective strength deficiency, left leg is weaker than right, right arm is weaker than left.  Face is symmetric, speech is clear.     ED Results / Procedures / Treatments   Labs (all labs ordered are listed, but only abnormal results are displayed) Labs Reviewed  CBC - Abnormal; Notable for the following components:      Result Value   RBC 3.86 (*)    Hemoglobin 10.4 (*)    HCT 33.5 (*)    RDW 15.9 (*)    All other components within normal limits  COMPREHENSIVE METABOLIC PANEL - Abnormal; Notable for the following components:   Glucose, Bld 144 (*)    BUN 32 (*)    Creatinine, Ser 1.82 (*)    Calcium 10.4 (*)    GFR, Estimated 41  (*)    All other components within normal limits  RAPID URINE DRUG SCREEN, HOSP PERFORMED - Abnormal; Notable for the following components:   Cocaine POSITIVE (*)    All other components within normal limits  URINALYSIS, ROUTINE W REFLEX MICROSCOPIC - Abnormal; Notable for the following components:   Glucose, UA >=500 (*)    Protein, ur 100 (*)    All other components within normal limits  PROTIME-INR  APTT  DIFFERENTIAL  ETHANOL  I-STAT CHEM 8, ED    EKG EKG Interpretation  Date/Time:  Wednesday February 04 2023 13:17:59 EST Ventricular Rate:  87 PR Interval:  180 QRS Duration: 92 QT Interval:  356 QTC Calculation: 428 R Axis:   24 Text Interpretation: Normal sinus rhythm Left ventricular hypertrophy with repolarization abnormality ( Sokolow-Lyon ) Abnormal ECG Confirmed by Carmin Muskrat [phone]) on 02/04/2023 3:14:23 PM  Radiology MR BRAIN WO CONTRAST  Result Date: 02/04/2023 CLINICAL DATA:  Left-sided weakness.  Stroke suspected. EXAM: MRI HEAD WITHOUT CONTRAST TECHNIQUE: Multiplanar, multiecho pulse sequences of the brain and surrounding structures were obtained without intravenous contrast. COMPARISON:  MR Head 01/13/23 FINDINGS: Brain: Negative for an acute infarct. No hydrocephalus. No extra-axial fluid collection. Chronic infarct in the corona radiata on the left. Sequela of moderate chronic microvascular ischemic change. No hemorrhage. Vascular: Normal flow voids. Skull and upper cervical spine: Normal marrow signal. Sinuses/Orbits: No middle ear or mastoid effusion. Paranasal sinuses are clear. Orbits are unremarkable. Other: None IMPRESSION: No acute intracranial process. Electronically Signed   By: Marin Roberts M.D.   On: 02/04/2023 15:07    Procedures Procedures    Medications Ordered in ED Medications  sodium chloride 0.9 % bolus 500 mL (500 mLs Intravenous New Bag/Given 02/04/23 1514)    Followed by  0.9 %  sodium chloride infusion (has no administration in time  range)    ED Course/ Medical Decision Making/ A&P                             Medical Decision Making With multiple medical issues including hypertension, cocaine use, TIA presents with weakness, dizziness, concern for possible TIA versus CVA versus electrolyte abnormalities versus medication effects.  Patient is awake, alert, labs sent MRI ordered.  Amount and/or Complexity of Data Reviewed Independent Historian: spouse External Data Reviewed: notes.    Details: Recent admission with concern for ongoing cocaine use, hypertension contributing to his TIA. Labs: ordered. Decision-making details documented in ED Course. Radiology: ordered and independent interpretation performed. Decision-making details documented in ED Course. ECG/medicine tests: ordered  and independent interpretation performed. Decision-making details documented in ED Course.  Risk Prescription drug management. Decision regarding hospitalization.   4:17 PM Patient awake, alert, no new complaints.  At length conversation with him and his wife, discussed all results reassuring MRI, cocaine positive tox screen, and labs that did not demonstrate new organ dysfunction.  He has mildly, persistently hypertensive we discussed option of additional meds, patient will start chlorthalidone, discussed this with his physician tomorrow.  However, no evidence for TIA, CVA, no new organ damage, patient is awake and alert, hemodynamically unremarkable, appropriate for discharge.        Final Clinical Impression(s) / ED Diagnoses Final diagnoses:  Hypertensive urgency    Rx / DC Orders ED Discharge Orders          Ordered    chlorthalidone (HYGROTEN) 15 MG tablet  Daily        02/04/23 1539              Carmin Muskrat, MD 02/04/23 1617

## 2023-02-04 NOTE — ED Triage Notes (Signed)
Pt hypertensive today, 144/99 at home per wife.  Wife reports pt with shakiness and dizziness ongoing for few days, wife believes that the medication Cymbalta is causing this.  Pt with hx of TIA's and CVA.  Recent admission at [facility] for TIA on 2/13.  Wife states pt with bilateral leg weakness.

## 2023-02-13 NOTE — Progress Notes (Signed)
NEUROLOGY CONSULTATION NOTE  Raymond Barrera. MRN: [id number] DOB: 1961/06/05  Referring provider: Francisca December, MD Primary care provider: Francisca December, MD  Reason for consult:  TIA  Assessment/Plan:   Probable MRI-negative left hemispheric stroke possibly secondary to left MCA stenosis vs small vessel disease - deficits still persists, making TIA unlikely Intracranial stenosis Hypertension Hyperlipidemia Type 2 diabetes mellitus Cocaine use Cigarette smoker Possible sleep apnea Coronary artery disease   Discontinue ASA.  Continue Plavix 75mg  daily monotherapy Secondary stroke prevention otherwise managed by PCP: Atorvastatin 40mg  daily.  LDL goal less than 70 Normotensive blood pressure Hgb A1c goal less than 7 Smoking cessation Cocaine cessation Mediterranean diet In addition to physical therapy, order occupational therapy for ongoing right arm and hand weakness Refer to sleep medicine for evaluation of sleep apnea Follow up 5 months.  Subjective:  Raymond Barrera is a 62 year old right-handed male witih HTN, HLD, DM II, CHF, sleep apnea and CAD with history of MI in 1997 and history of stroke in 2014 with residual right-sided weakness and slurred speech who presents for transient ischemic attack.  History supplemented by hospital records.  Patient has history of stroke and TIAs presenting with slurred speech and right sided weakness.  He has some mild residual right sided weakness since stroke in 2014..  On 2/13, he went to the bathroom and as he tried to get up, felt dizzy and had slurred speech..  Presented to the [facility] ED as Code Stroke.  NIHSS was 4 for slight right facial droop, slight slurred speech as well as limited left leg movement due to pain (baseline right upper and lower extremity weakness).  Blood pressure was 150s/100s.  UDS was positive for cocaine.  Symptoms resolved while in ED and tPA not given.  CT head revealed no acute abnormality.  MRI of  brain revealed chronic small vessel ischemic changes but no acute infarct.  MRA of head showed severe distal left ACA stenosis, moderate left proximal P2 PCA stenosis, moderate distal left MCA stenosis and severe distal right intradural vertebral artery stenosis but no emergent large vessel occlusion.  Carotid ultrasound showed minimal wall thickening without hemodynamically significant stenosis in the bilateral ICAs with abnormal waveform morphology in the proximal left ICA.  2D Echo demonstrated LVEF 30-35%.  LDL 107.9 and Hgb A1c 6.4.  He was on ASA 81mg  prior to admission.    He was discharged on ASA 81mg  and Plavix 75mg  DAP for 3 weeks followed by Plavix monotherapy.  He was started on atorvastatin.  He still reports weakness in the right hand and leg, more than baseline.  He continues to have right facial droop which was not present prior to recent stroke.  Sometimes feels weaker in the left hand.  He feels dizzy.  He also has new short-term memory difficulties only since the hospitalization.  Prior to the stroke, he was participating in physical therapy at [facility] for back pain with left radiculopathy.  He is now using a cane which he wasn't using prior to stroke.  Years ago, he was told that he had sleep apnea but was never formally diagnosed.  He has insomnia but when he does sleep, his wife notes that he snores with pauses in breathing.  He feels excessive daytime fatigue even on days that he slept most of the night.     PAST MEDICAL HISTORY: Past Medical History:  Diagnosis Date   Ambulates with cane    straight -  uses occasionally   Anxiety    due to the stroke   Arthritis    Back pain    hx of buldging disc   Complication of anesthesia    took a while for him to wake up after previous anesthesia   Diabetes mellitus without complication (HCC)    Family history of adverse reaction to anesthesia    "sometimes mom has a hard time waking up"   High cholesterol    takes Zocor  daily   Hypertension    takes Benazepril and HCTZ  daily   Joint pain    Joint swelling    Memory impairment    occassional - from stroke   Myocardial infarction (HCC) 1987   Pneumonia    hx of-80's   Shortness of breath dyspnea    do to pain   Sleep apnea    never had a sleep study,but states Dr. Janna Arch says he has it   Slurred speech    Stroke (HCC) 08/2013   7 mini-strokes, last stroke 2017   TIA (transient ischemic attack) 2014   x 7    Urinary frequency     PAST SURGICAL HISTORY: Past Surgical History:  Procedure Laterality Date   ABDOMINAL EXPOSURE N/A 06/16/2018   Procedure: ABDOMINAL EXPOSURE;  Surgeon: Larina Earthly, MD;  Location: [facility] OR;  Service: Vascular;  Laterality: N/A;   ANKLE SURGERY  2008   left ankle-otif-[facility]   ANTERIOR LUMBAR FUSION Bilateral 06/16/2018   Procedure: LUMBAR 4-5 LUMBAR 5-SACRUM 1 ANTERIOR LUMBAR INTERBODY FUSION WITH INSTRUMENTATION AND ALLOGRAFT;  Surgeon: Estill Bamberg, MD;  Location: [facility] OR;  Service: Orthopedics;  Laterality: Bilateral;   BACK SURGERY     HEMATOMA EVACUATION Left 08/13/2018   Procedure: EVACUATION HEMATOMA LEFT ABDOMINAL WALL;  Surgeon: Larina Earthly, MD;  Location: [facility] OR;  Service: Vascular;  Laterality: Left;   IR RADIOLOGIST EVAL & MGMT  07/27/2018   IR US GUIDE BX ASP/DRAIN  07/14/2018   JOINT REPLACEMENT     both hips replaced    LUMBAR LAMINECTOMY/DECOMPRESSION MICRODISCECTOMY Right 10/17/2014   Procedure: LUMBAR LAMINECTOMY/DECOMPRESSION MICRODISCECTOMY 2 LEVELS;  Surgeon: Lisbeth Renshaw, MD;  Location: [facility];  Service: Neurosurgery;  Laterality: Right;  Right L45 L5S1 laminectomy and foraminotomy   MASS EXCISION  09/13/2012   Procedure: EXCISION MASS;  Surgeon: Dalia Heading, MD;  Location: [facility];  Service: General;  Laterality: N/A;   ROBOTIC ASSITED PARTIAL NEPHRECTOMY Left 03/30/2019   Procedure: XI ROBOTIC ASSITED PARTIAL NEPHRECTOMY POSSIBLE RADICAL NEPHRECTOMY;  Surgeon: Rene Paci, MD;  Location: [facility];  Service: Urology;  Laterality: Left;   SHOULDER ARTHROSCOPY WITH ROTATOR CUFF REPAIR Right 04/12/2020   Procedure: right shoulder arthroscopy, debridement, mini open rotator cuff tear repair;  Surgeon: Cammy Copa, MD;  Location: [facility] OR;  Service: Orthopedics;  Laterality: Right;   TONSILLECTOMY     TOTAL HIP ARTHROPLASTY Right 03/20/2015   TOTAL HIP ARTHROPLASTY Right 03/20/2015   Procedure: TOTAL HIP ARTHROPLASTY ANTERIOR APPROACH;  Surgeon: Sheral Apley, MD;  Location: [facility] OR;  Service: Orthopedics;  Laterality: Right;   TOTAL HIP ARTHROPLASTY Left 01/08/2016   Procedure: TOTAL HIP ARTHROPLASTY ANTERIOR APPROACH;  Surgeon: Sheral Apley, MD;  Location: [facility] OR;  Service: Orthopedics;  Laterality: Left;    MEDICATIONS: Current Outpatient Medications on File Prior to Visit  Medication Sig Dispense Refill   atorvastatin (LIPITOR) 40 MG tablet Take 1 tablet (40 mg total) by mouth daily. 30 tablet  2   gabapentin (NEURONTIN) 100 MG capsule Take 100 mg by mouth 2 (two) times daily.     glucose 4 GM chewable tablet Chew 1 tablet by mouth as needed for low blood sugar.     JARDIANCE 10 MG TABS tablet Take 10 mg by mouth daily.     lidocaine (LIDODERM) 5 % Place 1 patch onto the skin daily. Remove & Discard patch within 12 hours or as directed by MD (Patient taking differently: Place 1 patch onto the skin daily as needed (PAIN). Remove & Discard patch within 12 hours or as directed by MD) 30 patch 0   ondansetron (ZOFRAN) 4 MG tablet Take 1 tablet (4 mg total) by mouth every 8 (eight) hours as needed for nausea or vomiting. 20 tablet 0   ramipril (ALTACE) 10 MG capsule Take 10 mg by mouth daily.     No current facility-administered medications on file prior to visit.    ALLERGIES: Allergies  Allergen Reactions   Poison Ivy Extract [Poison Ivy Extract] Itching and Rash    FAMILY HISTORY: Family History  Problem Relation Age of Onset   Heart disease  Father     Objective:  Blood pressure 126/78, pulse 83, height 6\' 6"  (1.981 m), weight 252 lb (114.3 kg), SpO2 99 %. General: No acute distress.  Patient appears well-groomed.   Head:  Normocephalic/atraumatic Eyes:  fundi examined but not visualized Neck: supple, no paraspinal tenderness, full range of motion Back: No paraspinal tenderness Heart: regular rate and rhythm Lungs: Clear to auscultation bilaterally. Vascular: No carotid bruits. Neurological Exam: Mental status: alert and oriented to person, place, and time, speech fluent but dysarthric, language intact. Cranial nerves: CN I: not tested CN II: pupils equal, round and reactive to light, visual fields intact CN III, IV, VI:  full range of motion, no nystagmus, no ptosis CN V: facial sensation intact. CN VII: Right lower facial weakness CN VIII: hearing intact CN IX, X: gag intact, uvula midline CN XI: sternocleidomastoid and trapezius muscles intact CN XII: tongue midline Bulk & Tone: normal, no fasciculations. Motor:  muscle strength 4+/5 right upper extremity and right lower extremity except 5/5 ankle dorsiflexion and plantar flexion; 5/5 right upper and lower extremities Sensation:  Pinprick sensation reduced in left foot.  Vibratory sensation reduced in feet bilaterally. Deep Tendon Reflexes:  2+ upper extremities, absent lower extremities, toes downgoing Finger to nose testing:  Without dysmetria.   Gait:  Mildly wide-based.  Romberg negative.    Thank you for allowing me to take part in the care of this patient.  Shon Millet, DO  CC: Francisca December, MD

## 2023-02-17 ENCOUNTER — Encounter: Payer: Self-pay | Admitting: Neurology

## 2023-02-17 ENCOUNTER — Ambulatory Visit: Payer: Medicaid Other | Admitting: Neurology

## 2023-02-17 ENCOUNTER — Ambulatory Visit: Payer: Medicaid Other | Attending: Cardiovascular Disease | Admitting: Cardiovascular Disease

## 2023-02-17 ENCOUNTER — Ambulatory Visit (INDEPENDENT_AMBULATORY_CARE_PROVIDER_SITE_OTHER): Payer: Medicaid Other

## 2023-02-17 VITALS — BP 126/78 | HR 83 | Ht 78.0 in | Wt 252.0 lb

## 2023-02-17 VITALS — BP 112/74 | HR 85 | Ht 78.0 in | Wt 250.6 lb

## 2023-02-17 DIAGNOSIS — I129 Hypertensive chronic kidney disease with stage 1 through stage 4 chronic kidney disease, or unspecified chronic kidney disease: Secondary | ICD-10-CM | POA: Insufficient documentation

## 2023-02-17 DIAGNOSIS — R29898 Other symptoms and signs involving the musculoskeletal system: Secondary | ICD-10-CM

## 2023-02-17 DIAGNOSIS — F1410 Cocaine abuse, uncomplicated: Secondary | ICD-10-CM

## 2023-02-17 DIAGNOSIS — I5022 Chronic systolic (congestive) heart failure: Secondary | ICD-10-CM | POA: Diagnosis not present

## 2023-02-17 DIAGNOSIS — E785 Hyperlipidemia, unspecified: Secondary | ICD-10-CM

## 2023-02-17 DIAGNOSIS — E782 Mixed hyperlipidemia: Secondary | ICD-10-CM

## 2023-02-17 DIAGNOSIS — G459 Transient cerebral ischemic attack, unspecified: Secondary | ICD-10-CM | POA: Diagnosis not present

## 2023-02-17 DIAGNOSIS — G4730 Sleep apnea, unspecified: Secondary | ICD-10-CM

## 2023-02-17 DIAGNOSIS — F17200 Nicotine dependence, unspecified, uncomplicated: Secondary | ICD-10-CM | POA: Insufficient documentation

## 2023-02-17 DIAGNOSIS — I10 Essential (primary) hypertension: Secondary | ICD-10-CM

## 2023-02-17 DIAGNOSIS — I63512 Cerebral infarction due to unspecified occlusion or stenosis of left middle cerebral artery: Secondary | ICD-10-CM | POA: Diagnosis not present

## 2023-02-17 DIAGNOSIS — I4891 Unspecified atrial fibrillation: Secondary | ICD-10-CM | POA: Diagnosis present

## 2023-02-17 DIAGNOSIS — N1832 Chronic kidney disease, stage 3b: Secondary | ICD-10-CM | POA: Diagnosis present

## 2023-02-17 DIAGNOSIS — I6523 Occlusion and stenosis of bilateral carotid arteries: Secondary | ICD-10-CM | POA: Diagnosis not present

## 2023-02-17 DIAGNOSIS — E0800 Diabetes mellitus due to underlying condition with hyperosmolarity without nonketotic hyperglycemic-hyperosmolar coma (NKHHC): Secondary | ICD-10-CM

## 2023-02-17 DIAGNOSIS — E1159 Type 2 diabetes mellitus with other circulatory complications: Secondary | ICD-10-CM | POA: Diagnosis present

## 2023-02-17 MED ORDER — CLOPIDOGREL BISULFATE 75 MG PO TABS: 75.0000 mg | ORAL_TABLET | Freq: Every day | ORAL | 5 refills | Status: DC

## 2023-02-17 NOTE — Patient Instructions (Signed)
Continue clopidogrel (Plavix) 75mg  daily.  STOP ASPIRIN Continue other medications (atorvastatin, diabetes medication, blood pressure medication) Continue physical therapy at [facility] but also will order occupational therapy as well (right hand/arm weakness) Continue trying to quit smoking Quit use of cocaine Mediterranean diet (see below) Refer to sleep medicine for evaluation of sleep apnea Follow up 5 months.  Mediterranean Diet A Mediterranean diet refers to food and lifestyle choices that are based on the traditions of countries located on the [employer]. It focuses on eating more fruits, vegetables, whole grains, beans, nuts, seeds, and heart-healthy fats, and eating less dairy, meat, eggs, and processed foods with added sugar, salt, and fat. This way of eating has been shown to help prevent certain conditions and improve outcomes for people who have chronic diseases, like kidney disease and heart disease. What are tips for following this plan? Reading food labels Check the serving size of packaged foods. For foods such as rice and pasta, the serving size refers to the amount of cooked product, not dry. Check the total fat in packaged foods. Avoid foods that have saturated fat or trans fats. Check the ingredient list for added sugars, such as corn syrup. Shopping  Buy a variety of foods that offer a balanced diet, including: Fresh fruits and vegetables (produce). Grains, beans, nuts, and seeds. Some of these may be available in unpackaged forms or large amounts (in bulk). Fresh seafood. Poultry and eggs. Low-fat dairy products. Buy whole ingredients instead of prepackaged foods. Buy fresh fruits and vegetables in-season from local farmers markets. Buy plain frozen fruits and vegetables. If you do not have access to quality fresh seafood, buy precooked frozen shrimp or canned fish, such as tuna, salmon, or sardines. Stock your pantry so you always have certain foods on  hand, such as olive oil, canned tuna, canned tomatoes, rice, pasta, and beans. Cooking Cook foods with extra-virgin olive oil instead of using butter or other vegetable oils. Have meat as a side dish, and have vegetables or grains as your main dish. This means having meat in small portions or adding small amounts of meat to foods like pasta or stew. Use beans or vegetables instead of meat in common dishes like chili or lasagna. Experiment with different cooking methods. Try roasting, broiling, steaming, and sauting vegetables. Add frozen vegetables to soups, stews, pasta, or rice. Add nuts or seeds for added healthy fats and plant protein at each meal. You can add these to yogurt, salads, or vegetable dishes. Marinate fish or vegetables using olive oil, lemon juice, garlic, and fresh herbs. Meal planning Plan to eat one vegetarian meal one day each week. Try to work up to two vegetarian meals, if possible. Eat seafood two or more times a week. Have healthy snacks readily available, such as: Vegetable sticks with hummus. Greek yogurt. Fruit and nut trail mix. Eat balanced meals throughout the week. This includes: Fruit: 2-3 servings a day. Vegetables: 4-5 servings a day. Low-fat dairy: 2 servings a day. Fish, poultry, or lean meat: 1 serving a day. Beans and legumes: 2 or more servings a week. Nuts and seeds: 1-2 servings a day. Whole grains: 6-8 servings a day. Extra-virgin olive oil: 3-4 servings a day. Limit red meat and sweets to only a few servings a month. Lifestyle  Cook and eat meals together with your family, when possible. Drink enough fluid to keep your urine pale yellow. Be physically active every day. This includes: Aerobic exercise like running or swimming. Leisure activities like  gardening, walking, or housework. Get 7-8 hours of sleep each night. If recommended by your health care provider, drink red wine in moderation. This means 1 glass a day for nonpregnant women  and 2 glasses a day for men. A glass of wine equals 5 oz (150 mL). What foods should I eat? Fruits Apples. Apricots. Avocado. Berries. Bananas. Cherries. Dates. Figs. Grapes. Lemons. Melon. Oranges. Peaches. Plums. Pomegranate. Vegetables Artichokes. Beets. Broccoli. Cabbage. Carrots. Eggplant. Green beans. Chard. Kale. Spinach. Onions. Leeks. Peas. Squash. Tomatoes. Peppers. Radishes. Grains Whole-grain pasta. Brown rice. Bulgur wheat. Polenta. Couscous. Whole-wheat bread. [place]. Meats and other proteins Beans. Almonds. Sunflower seeds. Pine nuts. Peanuts. [place]. Salmon. Scallops. Shrimp. [place]. Tilapia. Clams. Oysters. Eggs. Poultry without skin. Dairy Low-fat milk. Cheese. Greek yogurt. Fats and oils Extra-virgin olive oil. Avocado oil. Grapeseed oil. Beverages Water. Red wine. Herbal tea. Sweets and desserts Greek yogurt with honey. Baked apples. Poached pears. Trail mix. Seasonings and condiments Basil. Cilantro. Coriander. Cumin. Mint. Parsley. Sage. Rosemary. Tarragon. Garlic. Oregano. Thyme. Pepper. Balsamic vinegar. Tahini. Hummus. Tomato sauce. Olives. Mushrooms. The items listed above may not be a complete list of foods and beverages you can eat. Contact a dietitian for more information. What foods should I limit? This is a list of foods that should be eaten rarely or only on special occasions. Fruits Fruit canned in syrup. Vegetables Deep-fried potatoes (french fries). Grains Prepackaged pasta or rice dishes. Prepackaged cereal with added sugar. Prepackaged snacks with added sugar. Meats and other proteins Beef. Pork. Lamb. Poultry with skin. Hot dogs. Berniece Salines. Dairy Ice cream. Sour cream. Whole milk. Fats and oils Butter. Canola oil. Vegetable oil. Beef fat (tallow). Lard. Beverages Juice. Sugar-sweetened soft drinks. Beer. Liquor and spirits. Sweets and desserts Cookies. Cakes. Pies. Candy. Seasonings and condiments Mayonnaise. Pre-made sauces and  marinades. The items listed above may not be a complete list of foods and beverages you should limit. Contact a dietitian for more information. Summary The Mediterranean diet includes both food and lifestyle choices. Eat a variety of fresh fruits and vegetables, beans, nuts, seeds, and whole grains. Limit the amount of red meat and sweets that you eat. If recommended by your health care provider, drink red wine in moderation. This means 1 glass a day for nonpregnant women and 2 glasses a day for men. A glass of wine equals 5 oz (150 mL). This information is not intended to replace advice given to you by your health care provider. Make sure you discuss any questions you have with your health care provider. Document Revised: 12/23/2019 Document Reviewed: 10/20/2019 Elsevier Patient Education  [place].

## 2023-02-17 NOTE — Progress Notes (Signed)
Cardiology Office Note:    Date:  02/22/2023   ID:  Raymond Gandy., DOB [birth year], MRN [id number]  PCP:  Dulce Sellar, MD  Cardiologist:  Sanda Klein, MD   Primary MD: Cherene Altes, MD   Chief Complaint  Patient presents with   Congestive Heart Failure    History of Present Illness:    Raymond Schneider. is a 62 y.o. male with a hx of long-standing hypertension and diabetes mellitus (not requiring insulin) and hypercholesterolemia (on statin), previous ischemic stroke (March 2018) s/p complex lumbar spine surgery, moderate CKD following nephrectomy.  In March 2018 he had a left internal capsule ischemic stroke and he has mild right hand paresis as well as some difficulty with speech and poor short-term memory.  Prior to that in 2014 he had multiple "mini strokes" in the setting of severe hypertension.    Unfortunately he had another acute neurological event, a TIA that occurred on 01/13/2023.  The presentation was with weakness of the right hand so that he would drop things and mild worsening of his speech.  The MRI/MRA of the head did not show evidence of an acute stroke or acute large vessel occlusion, but several abnormalities of the intracranial circulation were identified.  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  These events occurred in the setting of cocaine induced hypertensive urgency.  During the same hospitalization his echocardiogram showed worsening of LV systolic function with ejection fraction that dropped again to 30-35% with global hypokinesis in the setting of mild concentric LVH.  There were no significant valvular abnormalities seen.  The ascending aorta was mildly dilated at 41 mm.  In 2021, his most recent previous echo showed an ejection fraction  of 40-45%, but over the last 10 years his ejection fraction has varied up and down, as low as 24% by quantitative scintigraphy in 2019, as high as 50-55% after he started treatment with Entresto in 2019.  Unfortunately, Delene Loll was stopped due to renal dysfunction and symptomatic orthostatic hypotension after he underwent nephrectomy for renal cell carcinoma in April 2020 (syncope in May 2020, no evidence of significant arrhythmia on event monitoring).  Crestor was started in May 2020.  At some point this was switched to atorvastatin, but he stopped this 2 weeks ago since he thought his dizziness was related to the atorvastatin.  His most recent lipid profile shows an LDL cholesterol of 108 and he still has a chronically low HDL which was 28 (01/14/2023).  Triglycerides are markedly elevated at 548, but not sure whether this was a fasting sample.  Typically his triglycerides have been around 200.  Glycemic control has been good.  His hemoglobin A1c was 6.4% in February 2024.  His most recent creatinine has been 1.82.  His baseline since nephrectomy has been 1.8-2.2.  The patient has never undergone cardiac catheterization.  He had normal perfusion on his nuclear stress test in 2019.  On my review of a chest CT angiogram performed March 2018 (performed for "rule out pulmonary embolism"), there is a limited coronary atherosclerotic calcification, primarily seen in the mid LAD artery.  The ascending aorta is mildly dilated at 4.2 cm.  He has not had any alcohol in 30 years.  He has a history of cocaine use.  His urine drug screens have been positive in 2014,  2018, 2021, 2022: February 2024 and March 2024.  He is currently smoking 1/4 pack of cigarettes a day, but smoked about a pack a day for over 30 years.  Diabetes control is borderline, last Hgb A1c 7.1%.Marland Kitchen     He has had multiple surgical procedures in the last for 5 years such as hip replacement, ankle surgery and cervical spine surgery and lumbar  spine surgery.    Past Medical History:  Diagnosis Date   Ambulates with cane    straight - uses occasionally   Anxiety    due to the stroke   Arthritis    Back pain    hx of buldging disc   Complication of anesthesia    took a while for him to wake up after previous anesthesia   Diabetes mellitus without complication ([place])    Family history of adverse reaction to anesthesia    "sometimes mom has a hard time waking up"   High cholesterol    takes Zocor daily   Hypertension    takes Benazepril and HCTZ  daily   Joint pain    Joint swelling    Memory impairment    occassional - from stroke   Myocardial infarction ([place]) 1987   Pneumonia    hx of-80's   Shortness of breath dyspnea    do to pain   Sleep apnea    never had a sleep study,but states Dr. Cindie Laroche says he has it   Slurred speech    Stroke ([place]) 08/2013   7 mini-strokes, last stroke 2017   TIA (transient ischemic attack) 2014   x 7    Urinary frequency     Past Surgical History:  Procedure Laterality Date   ABDOMINAL EXPOSURE N/A 06/16/2018   Procedure: ABDOMINAL EXPOSURE;  Surgeon: Rosetta Posner, MD;  Location: [facility];  Service: Vascular;  Laterality: N/A;   ANKLE SURGERY  2008   left ankle-otif-[facility]   ANTERIOR LUMBAR FUSION Bilateral 06/16/2018   Procedure: LUMBAR 4-5 LUMBAR 5-SACRUM 1 ANTERIOR LUMBAR INTERBODY FUSION WITH INSTRUMENTATION AND ALLOGRAFT;  Surgeon: Phylliss Bob, MD;  Location: [facility];  Service: Orthopedics;  Laterality: Bilateral;   BACK SURGERY     HEMATOMA EVACUATION Left 08/13/2018   Procedure: EVACUATION HEMATOMA LEFT ABDOMINAL WALL;  Surgeon: Rosetta Posner, MD;  Location[facility];  Service: Vascular;  Laterality: Left;   IR RADIOLOGIST EVAL & MGMT  07/27/2018   IR US GUIDE BX ASP/DRAIN  07/14/2018   JOINT REPLACEMENT     both hips replaced    LUMBAR LAMINECTOMY/DECOMPRESSION MICRODISCECTOMY Right 10/17/2014   Procedure: LUMBAR LAMINECTOMY/DECOMPRESSION MICRODISCECTOMY 2 LEVELS;  Surgeon:  [name], MD;  Location: [facility];  Service: Neurosurgery;  Laterality: Right;  Right L45 L5S1 laminectomy and foraminotomy   MASS EXCISION  09/13/2012   Procedure: EXCISION MASS;  Surgeon: Jamesetta So, MD;  Location: [facility];  Service: General;  Laterality: N/A;   ROBOTIC ASSITED PARTIAL NEPHRECTOMY Left 03/30/2019   Procedure: XI ROBOTIC ASSITED PARTIAL NEPHRECTOMY POSSIBLE RADICAL NEPHRECTOMY;  Surgeon: Ceasar Mons, MD;  Location: [facility];  Service: Urology;  Laterality: Left;   SHOULDER ARTHROSCOPY WITH ROTATOR CUFF REPAIR Right 04/12/2020   Procedure: right shoulder arthroscopy, debridement, mini open rotator cuff tear repair;  Surgeon: Meredith Pel, MD;  Location: [facility];  Service: Orthopedics;  Laterality: Right;   TONSILLECTOMY     TOTAL HIP ARTHROPLASTY Right 03/20/2015   TOTAL HIP ARTHROPLASTY Right 03/20/2015   Procedure: TOTAL HIP  ARTHROPLASTY ANTERIOR APPROACH;  Surgeon: Renette Butters, MD;  Location: [facility];  Service: Orthopedics;  Laterality: Right;   TOTAL HIP ARTHROPLASTY Left 01/08/2016   Procedure: TOTAL HIP ARTHROPLASTY ANTERIOR APPROACH;  Surgeon: Renette Butters, MD;  Location: [facility];  Service: Orthopedics;  Laterality: Left;    Current Medications: No outpatient medications have been marked as taking for the 02/17/23 encounter (Office Visit) with Rahshawn Remo, Dani Gobble, MD.     Allergies:   Poison ivy extract [poison ivy extract]   Social History   Socioeconomic History   Marital status: Married    Spouse name: Not on file   Number of children: Not on file   Years of education: Not on file   Highest education level: Not on file  Occupational History   Not on file  Tobacco Use   Smoking status: Some Days    Packs/day: 0.25    Years: 47.00    Additional pack years: 0.00    Total pack years: 11.75    Types: Cigarettes   Smokeless tobacco: Never  Vaping Use   Vaping Use: Never used  Substance and Sexual Activity   Alcohol use: Not  Currently    Comment: quit 2012   Drug use: Not Currently    Types: Cocaine    Comment: many yrs ago., last time- late 2016   Sexual activity: Yes  Other Topics Concern   Not on file  Social History Narrative   Are you right handed or left handed? Right   Are you currently employed ? disabled   What is your current occupation?   Do you live at home alone? NO   Who lives with you? Mother, Wife   What type of home do you live in: 1 story or 2 story? 1       Social Determinants of Health   Financial Resource Strain: Not on file  Food Insecurity: No Food Insecurity (01/13/2023)   Hunger Vital Sign    Worried About Running Out of Food in the Last Year: Never true    Ran Out of Food in the Last Year: Never true  Transportation Needs: No Transportation Needs (01/13/2023)   PRAPARE - Hydrologist (Medical): No    Lack of Transportation (Non-Medical): No  Physical Activity: Not on file  Stress: Not on file  Social Connections: Not on file     Family History: The patient's family history includes Heart disease in his father.  ROS:   Please see the history of present illness.     All other systems reviewed and are negative.  EKGs/Labs/Other Studies Reviewed:    The following studies were reviewed today: Echo January 13 2023   1. Left ventricular ejection fraction, by estimation, is 30 to 35%. The  left ventricle has moderately decreased function. The left ventricle  demonstrates global hypokinesis. There is mild concentric left ventricular  hypertrophy. Left ventricular  diastolic parameters are consistent with Grade I diastolic dysfunction  (impaired relaxation).   2. Right ventricular systolic function is normal. The right ventricular  size is normal. Tricuspid regurgitation signal is inadequate for assessing  PA pressure.   3. The mitral valve is normal in structure. Trivial mitral valve  regurgitation.   4. The aortic valve is tricuspid.  There is mild calcification of the  aortic valve. There is mild thickening of the aortic valve. Aortic valve  regurgitation is not visualized. Aortic valve sclerosis/calcification is  present, without any evidence of  aortic stenosis.   5. Aortic dilatation noted. There is borderline dilatation of the aortic  root, measuring 38 mm. There is mild dilatation of the ascending aorta,  measuring 41 mm.   6. The inferior vena cava is normal in size with greater than 50%  respiratory variability, suggesting right atrial pressure of 3 mmHg.   Comparison(s): Compared to prior TTE, the EF has decreased from 40-45% to  ~35%. Otherwise, there is no significant change.   EKG:  EKG is not ordered today.  ECG from 02/04/2023 shows normal sinus rhythm with left ventricular hypertrophy with secondary repolarization abnormalities.  Recent Labs: 02/04/2023: ALT 16; BUN 32; Creatinine, Ser 1.82; Hemoglobin 10.4; Platelets 282; Potassium 4.3; Sodium 135  Recent Lipid Panel    Component Value Date/Time   CHOL 200 01/14/2023 0318   TRIG 548 (H) 01/14/2023 0318   HDL 28 (L) 01/14/2023 0318   CHOLHDL 7.1 01/14/2023 0318   VLDL UNABLE TO CALCULATE IF TRIGLYCERIDE OVER 400 mg/dL 01/14/2023 0318   LDLCALC UNABLE TO CALCULATE IF TRIGLYCERIDE OVER 400 mg/dL 01/14/2023 0318   LDLDIRECT 107.9 (H) 01/14/2023 0318    Physical Exam:    VS:  BP 112/74 (BP Location: Left Arm, Patient Position: Sitting, Cuff Size: Normal)   Pulse 85   Ht 6\' 6"  (1.981 m)   Wt 250 lb 9.6 oz (113.7 kg)   SpO2 98%   BMI 28.96 kg/m     Wt Readings from Last 3 Encounters:  02/17/23 250 lb 9.6 oz (113.7 kg)  02/17/23 252 lb (114.3 kg)  01/13/23 251 lb 12.3 oz (114.2 kg)      General: Alert, oriented x3, no distress, overweight Head: no evidence of trauma, PERRL, EOMI, no exophtalmos or lid lag, no myxedema, no xanthelasma; normal ears, nose and oropharynx Neck: normal jugular venous pulsations and no hepatojugular reflux; brisk  carotid pulses without delay and no carotid bruits Chest: clear to auscultation, no signs of consolidation by percussion or palpation, normal fremitus, symmetrical and full respiratory excursions Cardiovascular: normal position and quality of the apical impulse, regular rhythm, normal first and second heart sounds, no murmurs, rubs or gallops Abdomen: no tenderness or distention, no masses by palpation, no abnormal pulsatility or arterial bruits, normal bowel sounds, no hepatosplenomegaly Extremities: no clubbing, cyanosis or edema; 2+ radial, ulnar and brachial pulses bilaterally; 2+ right femoral, posterior tibial and dorsalis pedis pulses; 2+ left femoral, posterior tibial and dorsalis pedis pulses; no subclavian or femoral bruits Neurological: Mildly dysarthric, otherwise nonfocal exam Psych: Normal mood and affect    ASSESSMENT:    1. Chronic systolic heart failure ([place])   2. TIA (transient ischemic attack)   3. Hypertension, renal disease   4. Mixed hyperlipidemia   5. Type 2 diabetes mellitus with other circulatory complication, without long-term current use of insulin (HCC)   6. Stage 3b chronic kidney disease ([place])   7. Cocaine abuse ([place])   8. Current smoker     PLAN:    In order of problems listed above:   CMP: Presumably this is due to nonischemic cardiomyopathy since he had normal perfusion on previous nuclear imaging.  Not a good candidate for angiography due to renal dysfunction.  He appears to be clinically euvolemic although he is not taking any loop diuretics.  NYHA functional class I.  He is on Jardiance, ramipril, not currently on a beta-blocker.  Not on aldosterone antagonist, but we probably will not start 1 due to his renal dysfunction.  Note that in  the past we also had to retreat on treatment with Entresto due to his renal dysfunction.  Clinically he appears to be euvolemic. NYHA class 1-2.  Suspect that the wide variation in his ejection fraction of last year was  related to varying degrees of blood pressure control and use of cocaine.  Strongly recommend complete abstinence from cocaine.  Continue the current medications.  If we can document abstinence from cocaine we might consider starting treatment with a beta-blocker, preferentially carvedilol, but this should not be done if it drops his blood pressure too much due to the risk of recurrent neurological events..   Recent TIA/history of CVA/intracranial atherosclerotic cerebrovascular disease: it is important not to drop his blood pressure too much due to the wealth of intracranial arterial stenoses.  Check an event monitor to make sure he is not having atrial fibrillation. HTN: Very well controlled.  There may not be room to start carvedilol. HLP: he really should be on a statin.  I explained that I do not think that his dizziness has anything to do with this medication.  Target LDL less than 70. DM: Well-controlled with Jardiance monotherapy. CKD 3:  Post surgical single kidney.  Renal function has been quite stable since his nephrectomy.Marland Kitchen  He did not tolerate Entresto due to worsening renal function.  Watch carefully while on ramipril. Discussed avoidance of contrast studies, NSAIDs, other nephrotoxic agents, excessive diuresis. Recommend  Smoking cessation and complete abstinence from cocaine.   Medication Adjustments/Labs and Tests Ordered: Current medicines are reviewed at length with the patient today.  Concerns regarding medicines are outlined above.  Orders Placed This Encounter  Procedures   Direct LDL   Lipid panel   LONG TERM MONITOR (3-14 DAYS)   No orders of the defined types were placed in this encounter.   Patient Instructions  Medication Instructions:  No changes *If you need a refill on your cardiac medications before your next appointment, please call your pharmacy*   Lab Work: Fasting Lipids- 2 weeks If you have labs (blood work) drawn today and your tests are completely  normal, you will receive your results only by: [place] (if you have MyChart) OR A paper copy in the mail If you have any lab test that is abnormal or we need to change your treatment, we will call you to review the results.   Testing/Procedures: Your physician has recommended that you wear a 14 DAY ZIO-PATCH monitor. The Zio patch cardiac monitor continuously records heart rhythm data for up to 14 days, this is for patients being evaluated for multiple types heart rhythms. For the first 24 hours post application, please avoid getting the Zio monitor wet in the shower or by excessive sweating during exercise. After that, feel free to carry on with regular activities. Keep soaps and lotions away from the ZIO XT Patch.  This will be mailed to you, please expect 7-10 days to receive.    Applying the monitor   Shave hair from upper left chest.   Hold abrader disc by orange tab.  Rub abrader in 40 strokes over left upper chest as indicated in your monitor instructions.   Clean area with 4 enclosed alcohol pads .  Use all pads to assure are is cleaned thoroughly.  Let dry.   Apply patch as indicated in monitor instructions.  Patch will be place under collarbone on left side of chest with arrow pointing upward.   Rub patch adhesive wings for 2 minutes.Remove white label marked "1".  Remove white label marked "2".  Rub patch adhesive wings for 2 additional minutes.   While looking in a mirror, press and release button in center of patch.  A small green light will flash 3-4 times .  This will be your only indicator the monitor has been turned on.     Do not shower for the first 24 hours.  You may shower after the first 24 hours.   Press button if you feel a symptom. You will hear a small click.  Record Date, Time and Symptom in the Patient Log Book.   When you are ready to remove patch, follow instructions on last 2 pages of Patient Log Book.  Stick patch monitor onto last page of Patient  Log Book.   Place Patient Log Book in [place] box.  Use locking tab on box and tape box closed securely.  The [employer] has [employer] on it.  Please place in mailbox as soon as possible.  Your physician should have your test results approximately 7 days after the monitor has been mailed back to [facility].   Call [employer] at [phone] if you have questions regarding your ZIO XT patch monitor.  Call them immediately if you see an orange light blinking on your monitor.   If your monitor falls off in less than 4 days contact our Monitor department at [phone].  If your monitor becomes loose or falls off after 4 days call [employer] at [phone] for suggestions on securing your monitor    Follow-Up: At [facility], you and your health needs are our priority.  As part of our continuing mission to provide you with exceptional heart care, we have created designated Provider Care Teams.  These Care Teams include your primary Cardiologist (physician) and Advanced Practice Providers (APPs -  Physician Assistants and Nurse Practitioners) who all work together to provide you with the care you need, when you need it.  We recommend signing up for the patient portal called "MyChart".  Sign up information is provided on this After Visit Summary.  MyChart is used to connect with patients for Virtual Visits (Telemedicine).  Patients are able to view lab/test results, encounter notes, upcoming appointments, etc.  Non-urgent messages can be sent to your provider as well.   To learn more about what you can do with MyChart, go to [email or web address].    Your next appointment:   1 month(s) with an APP     Signed, Sanda Klein, MD  02/22/2023 9:01 PM    [facility]

## 2023-02-17 NOTE — Progress Notes (Unsigned)
Enrolled for [employer] to mail a ZIO XT long term holter monitor to the patients address on file.  

## 2023-02-17 NOTE — Patient Instructions (Signed)
Medication Instructions:  No changes *If you need a refill on your cardiac medications before your next appointment, please call your pharmacy*   Lab Work: Fasting Lipids- 2 weeks If you have labs (blood work) drawn today and your tests are completely normal, you will receive your results only by: [place] (if you have MyChart) OR A paper copy in the mail If you have any lab test that is abnormal or we need to change your treatment, we will call you to review the results.   Testing/Procedures: Your physician has recommended that you wear a 14 DAY ZIO-PATCH monitor. The Zio patch cardiac monitor continuously records heart rhythm data for up to 14 days, this is for patients being evaluated for multiple types heart rhythms. For the first 24 hours post application, please avoid getting the Zio monitor wet in the shower or by excessive sweating during exercise. After that, feel free to carry on with regular activities. Keep soaps and lotions away from the ZIO XT Patch.  This will be mailed to you, please expect 7-10 days to receive.    Applying the monitor   Shave hair from upper left chest.   Hold abrader disc by orange tab.  Rub abrader in 40 strokes over left upper chest as indicated in your monitor instructions.   Clean area with 4 enclosed alcohol pads .  Use all pads to assure are is cleaned thoroughly.  Let dry.   Apply patch as indicated in monitor instructions.  Patch will be place under collarbone on left side of chest with arrow pointing upward.   Rub patch adhesive wings for 2 minutes.Remove white label marked "1".  Remove white label marked "2".  Rub patch adhesive wings for 2 additional minutes.   While looking in a mirror, press and release button in center of patch.  A small green light will flash 3-4 times .  This will be your only indicator the monitor has been turned on.     Do not shower for the first 24 hours.  You may shower after the first 24 hours.   Press  button if you feel a symptom. You will hear a small click.  Record Date, Time and Symptom in the Patient Log Book.   When you are ready to remove patch, follow instructions on last 2 pages of Patient Log Book.  Stick patch monitor onto last page of Patient Log Book.   Place Patient Log Book in [place] box.  Use locking tab on box and tape box closed securely.  The [employer] has [employer] on it.  Please place in mailbox as soon as possible.  Your physician should have your test results approximately 7 days after the monitor has been mailed back to [facility].   Call [employer] at [phone] if you have questions regarding your ZIO XT patch monitor.  Call them immediately if you see an orange light blinking on your monitor.   If your monitor falls off in less than 4 days contact our Monitor department at [phone].  If your monitor becomes loose or falls off after 4 days call [employer] at [phone] for suggestions on securing your monitor    Follow-Up: At [facility], you and your health needs are our priority.  As part of our continuing mission to provide you with exceptional heart care, we have created designated Provider Care Teams.  These Care Teams include your primary Cardiologist (physician) and Advanced Practice Providers (APPs -  Physician Assistants and Nurse Practitioners) who all work together to provide you with the care you need, when you need it.  We recommend signing up for the patient portal called "MyChart".  Sign up information is provided on this After Visit Summary.  MyChart is used to connect with patients for Virtual Visits (Telemedicine).  Patients are able to view lab/test results, encounter notes, upcoming appointments, etc.  Non-urgent messages can be sent to your provider as well.   To learn more about what you can do with MyChart, go to [email or web address].    Your next appointment:   1 month(s) with an APP

## 2023-02-21 DIAGNOSIS — I4891 Unspecified atrial fibrillation: Secondary | ICD-10-CM

## 2023-02-22 ENCOUNTER — Encounter: Payer: Self-pay | Admitting: Cardiovascular Disease

## 2023-03-03 ENCOUNTER — Telehealth: Payer: Self-pay | Admitting: Primary Care

## 2023-03-03 ENCOUNTER — Encounter: Payer: Self-pay | Admitting: Primary Care

## 2023-03-03 ENCOUNTER — Ambulatory Visit (INDEPENDENT_AMBULATORY_CARE_PROVIDER_SITE_OTHER): Payer: Medicaid Other | Admitting: Primary Care

## 2023-03-03 VITALS — BP 130/86 | HR 78 | Temp 97.9°F | Ht 78.0 in | Wt 252.0 lb

## 2023-03-03 DIAGNOSIS — R0683 Snoring: Secondary | ICD-10-CM | POA: Insufficient documentation

## 2023-03-03 DIAGNOSIS — R0609 Other forms of dyspnea: Secondary | ICD-10-CM | POA: Diagnosis not present

## 2023-03-03 DIAGNOSIS — I639 Cerebral infarction, unspecified: Secondary | ICD-10-CM

## 2023-03-03 DIAGNOSIS — F17200 Nicotine dependence, unspecified, uncomplicated: Secondary | ICD-10-CM | POA: Diagnosis not present

## 2023-03-03 DIAGNOSIS — R0600 Dyspnea, unspecified: Secondary | ICD-10-CM | POA: Insufficient documentation

## 2023-03-03 NOTE — Patient Instructions (Addendum)
  Sleep apnea is defined as period of 10 seconds or longer when you stop breathing at night. This can happen multiple times a night. Dx sleep apnea is when this occurs more than 5 times an hour.    Mild OSA 5-15 apneic events an hour Moderate OSA 15-30 apneic events an hour Severe OSA > 30 apneic events an hour   Untreated sleep apnea puts you at higher risk for cardiac arrhythmias, pulmonary HTN, stroke and diabetes  Treatment options include weight loss, side sleeping position, oral appliance, CPAP therapy or referral to ENT for possible surgical options    Recommendations: Focus on side sleeping position or elevate head with wedge pillow 30 degrees Work on weight loss efforts if able  Do not drive if experiencing excessive daytime sleepiness of fatigue    Orders: Split night sleep study re: loud snoring/ CVA  Pulmonary function testings re: dyspnea    Follow-up: Please call to schedule follow-up 1-2 weeks after completing home sleep study and pulmonary function testing to review results and treatment if needed (can be virtual)

## 2023-03-03 NOTE — Assessment & Plan Note (Signed)
-   Current smoker. Patient has dyspnea symptoms with exertion and chronic cough. Not on maintenance inhalers. Getting pulmonary function testing to assess for obstructive lung disease

## 2023-03-03 NOTE — Assessment & Plan Note (Addendum)
-   Patient has symptoms of loud snoring and witnessed apnea. He his history of heart failure and CVA with residual left sided weakness. He needs in-lab split night sleep study to assess for OSA and CPAP pressure settings. We reviewed risks of untreated sleep apnea including cardiac arrhthymias, stroke, pulmonary HTN and diabetes. We discussed treatment options including weight loss, oral appliance, cpap or referral to ENT for possible surgical options. FU 1-2 weeks after sleep study to review results and treatment options.

## 2023-03-03 NOTE — Progress Notes (Unsigned)
@Patient  ID: Raymond Gandy., male    DOB: [birth year], 62 y.o.   MRN: [id number]  Chief Complaint  Patient presents with   Consult    Snoring, insomnia and episodes when pt stops breathing during night.      Referring provider: Pieter Partridge, DO  HPI: 62 year old male, current smoker. PMH significant for chronic systolic heart failure, CVA, HTN, diabetes, hyperlipidemia, hx renal cell carcinoma.    03/03/2023 Patient presents today for sleep consults. Patient had a CVA in February 2024. He has associated right sided weakness and speech impairment. He has symptoms of loud snoring and witnessed apnea. Wife is concerned. He feels his sleep is restless. He will wake himself up at times snoring. Typical bedtime is between 12am-1am. His sleep schedule really can vary. It can take him hours to fall asleep. He wakes up several times a night. At times he starts his day as early as 3am. His father had sleep apnea and wore CPAP.   He is a current smoker. He smokes on average 3 cigarettes a day. He has some baseline shortness of breath and chronic cough. No pulmonary function testing on file. He ambulates with cane. He attends physical therapy three days a week. No oxygen use.   Sleep questionnaire Symptoms-  loud snoring, witnessed apnea, restless sleep  Prior sleep study- none Bedtime- 12am-1am Time to fall asleep- typically a long time/ varies  Nocturnal awakenings- several times  Out of bed/start of day- some days he is up at 3am Weight changes- no Do you operate heavy machinery- no Do you currently wear CPAP- no Do you current wear oxygen- no Epworth- 18  Allergies  Allergen Reactions   Poison Ivy Extract [Poison Ivy Extract] Itching and Rash    Immunization History  Administered Date(s) Administered   Influenza,inj,Quad PF,6+ Mos 10/18/2014   Moderna Sars-Covid-2 Vaccination 03/22/2020, 04/19/2020   Pneumococcal Polysaccharide-23 03/09/2013, 10/18/2014, 06/18/2018    Past  Medical History:  Diagnosis Date   Ambulates with cane    straight - uses occasionally   Anxiety    due to the stroke   Arthritis    Back pain    hx of buldging disc   Complication of anesthesia    took a while for him to wake up after previous anesthesia   Diabetes mellitus without complication    Family history of adverse reaction to anesthesia    "sometimes mom has a hard time waking up"   High cholesterol    takes Zocor daily   Hypertension    takes Benazepril and HCTZ  daily   Joint pain    Joint swelling    Memory impairment    occassional - from stroke   Myocardial infarction 1987   Pneumonia    hx of-80's   Shortness of breath dyspnea    do to pain   Sleep apnea    never had a sleep study,but states Dr. Cindie Laroche says he has it   Slurred speech    Stroke 08/2013   7 mini-strokes, last stroke 2017   TIA (transient ischemic attack) 2014   x 7    Urinary frequency     Tobacco History: Social History   Tobacco Use  Smoking Status Some Days   Packs/day: 0.25   Years: 47.00   Additional pack years: 0.00   Total pack years: 11.75   Types: Cigarettes  Smokeless Tobacco Never  Tobacco Comments   Currently smokes about 2 cigarettes every  other day.  03/03/23 am   Ready to quit: Not Answered Counseling given: Not Answered Tobacco comments: Currently smokes about 2 cigarettes every other day.  03/03/23 am   Outpatient Medications Prior to Visit  Medication Sig Dispense Refill   clopidogrel (PLAVIX) 75 MG tablet Take 1 tablet (75 mg total) by mouth daily. 30 tablet 5   gabapentin (NEURONTIN) 100 MG capsule Take 100 mg by mouth 2 (two) times daily.     glucose 4 GM chewable tablet Chew 1 tablet by mouth as needed for low blood sugar.     JARDIANCE 10 MG TABS tablet Take 10 mg by mouth daily.     lidocaine (LIDODERM) 5 % Place 1 patch onto the skin daily. Remove & Discard patch within 12 hours or as directed by MD (Patient taking differently: Place 1 patch onto the  skin daily as needed (PAIN). Remove & Discard patch within 12 hours or as directed by MD) 30 patch 0   ondansetron (ZOFRAN) 4 MG tablet Take 1 tablet (4 mg total) by mouth every 8 (eight) hours as needed for nausea or vomiting. 20 tablet 0   ramipril (ALTACE) 10 MG capsule Take 10 mg by mouth daily.     No facility-administered medications prior to visit.   Review of Systems  Review of Systems  Constitutional:  Positive for fatigue.  HENT: Negative.    Respiratory:  Positive for apnea and shortness of breath.   Cardiovascular: Negative.   Psychiatric/Behavioral:  Positive for sleep disturbance.    Physical Exam  BP 130/86 (BP Location: Right Arm, Patient Position: Sitting, Cuff Size: Large)   Pulse 78   Temp 97.9 F (36.6 C) (Oral)   Ht 6\' 6"  (1.981 m)   Wt 252 lb (114.3 kg)   SpO2 99%   BMI 29.12 kg/m  Physical Exam Constitutional:      General: He is not in acute distress.    Appearance: Normal appearance. He is normal weight. He is not ill-appearing.  HENT:     Head: Normocephalic and atraumatic.     Mouth/Throat:     Mouth: Mucous membranes are moist.     Pharynx: Oropharynx is clear.  Pulmonary:     Effort: Pulmonary effort is normal.     Breath sounds: No wheezing or rhonchi.  Musculoskeletal:     Comments: Ambulates with cane; Left sided weakness   Skin:    General: Skin is warm and dry.  Neurological:     General: No focal deficit present.     Mental Status: He is alert and oriented to person, place, and time. Mental status is at baseline.  Psychiatric:        Mood and Affect: Mood normal.        Behavior: Behavior normal.        Thought Content: Thought content normal.        Judgment: Judgment normal.      Lab Results:  CBC    Component Value Date/Time   WBC 7.1 02/04/2023 1400   RBC 3.86 (L) 02/04/2023 1400   HGB 10.4 (L) 02/04/2023 1400   HCT 33.5 (L) 02/04/2023 1400   PLT 282 02/04/2023 1400   MCV 86.8 02/04/2023 1400   MCH 26.9 02/04/2023  1400   MCHC 31.0 02/04/2023 1400   RDW 15.9 (H) 02/04/2023 1400   LYMPHSABS 1.4 02/04/2023 1400   MONOABS 0.4 02/04/2023 1400   EOSABS 0.1 02/04/2023 1400   BASOSABS 0.0 02/04/2023 1400  BMET    Component Value Date/Time   NA 135 02/04/2023 1400   K 4.3 02/04/2023 1400   CL 106 02/04/2023 1400   CO2 22 02/04/2023 1400   GLUCOSE 144 (H) 02/04/2023 1400   BUN 32 (H) 02/04/2023 1400   CREATININE 1.82 (H) 02/04/2023 1400   CALCIUM 10.4 (H) 02/04/2023 1400   GFRNONAA 41 (L) 02/04/2023 1400   GFRAA 40 (L) 04/12/2020 1425    BNP    Component Value Date/Time   BNP 6.9 04/18/2019 1138    ProBNP No results found for: "PROBNP"  Imaging: MR BRAIN WO CONTRAST  Result Date: 02/04/2023 CLINICAL DATA:  Left-sided weakness.  Stroke suspected. EXAM: MRI HEAD WITHOUT CONTRAST TECHNIQUE: Multiplanar, multiecho pulse sequences of the brain and surrounding structures were obtained without intravenous contrast. COMPARISON:  MR Head 01/13/23 FINDINGS: Brain: Negative for an acute infarct. No hydrocephalus. No extra-axial fluid collection. Chronic infarct in the corona radiata on the left. Sequela of moderate chronic microvascular ischemic change. No hemorrhage. Vascular: Normal flow voids. Skull and upper cervical spine: Normal marrow signal. Sinuses/Orbits: No middle ear or mastoid effusion. Paranasal sinuses are clear. Orbits are unremarkable. Other: None IMPRESSION: No acute intracranial process. Electronically Signed   By: Marin Roberts M.D.   On: 02/04/2023 15:07     Assessment & Plan:   Loud snoring - Patient has symptoms of loud snoring and witnessed apnea. He his history of heart failure and CVA with residual left sided weakness. He needs in-lab split night sleep study to assess for OSA and CPAP pressure settings. We reviewed risks of untreated sleep apnea including cardiac arrhthymias, stroke, pulmonary HTN and diabetes. We discussed treatment options including weight loss, oral appliance,  cpap or referral to ENT for possible surgical options. FU 1-2 weeks after sleep study to review results and treatment options.   Dyspnea - Current smoker. Patient has dyspnea symptoms with exertion and chronic cough. Not on maintenance inhalers. Getting pulmonary function testing to assess for obstructive lung disease    Martyn Ehrich, NP 03/03/2023

## 2023-03-03 NOTE — Telephone Encounter (Signed)
Please schedule PFTs - first available. He will follow up virtually after testing and sleep study

## 2023-03-04 NOTE — Telephone Encounter (Signed)
Called pt and spoke with spouse and have scheduled pt for PFT. Nothing further needed.

## 2023-03-08 NOTE — Progress Notes (Signed)
Reviewed and agree with assessment/plan.   Cheryle Dark, MD [facility] 03/08/2023, 9:55 AM Pager:  [phone]  

## 2023-03-10 ENCOUNTER — Ambulatory Visit (INDEPENDENT_AMBULATORY_CARE_PROVIDER_SITE_OTHER): Payer: Medicaid Other | Admitting: Pulmonary Disease

## 2023-03-10 DIAGNOSIS — F17200 Nicotine dependence, unspecified, uncomplicated: Secondary | ICD-10-CM | POA: Diagnosis not present

## 2023-03-10 DIAGNOSIS — R0609 Other forms of dyspnea: Secondary | ICD-10-CM

## 2023-03-10 LAB — PULMONARY FUNCTION TEST
DL/VA % pred: 115 %
DL/VA: 4.64 ml/min/mmHg/L
DLCO cor % pred: 67 %
DLCO cor: 23.71 ml/min/mmHg
DLCO unc % pred: 57 %
DLCO unc: 20.33 ml/min/mmHg
FEF 25-75 Post: 1.6 L/sec
FEF 25-75 Pre: 1.69 L/sec
FEF2575-%Change-Post: -4 %
FEF2575-%Pred-Post: 43 %
FEF2575-%Pred-Pre: 45 %
FEV1-%Change-Post: -1 %
FEV1-%Pred-Post: 44 %
FEV1-%Pred-Pre: 45 %
FEV1-Post: 2.1 L
FEV1-Pre: 2.14 L
FEV1FVC-%Change-Post: -7 %
FEV1FVC-%Pred-Pre: 94 %
FEV6-%Change-Post: 7 %
FEV6-%Pred-Post: 53 %
FEV6-%Pred-Pre: 50 %
FEV6-Post: 3.24 L
FEV6-Pre: 3.01 L
FEV6FVC-%Change-Post: 0 %
FEV6FVC-%Pred-Post: 104 %
FEV6FVC-%Pred-Pre: 103 %
FVC-%Change-Post: 7 %
FVC-%Pred-Post: 51 %
FVC-%Pred-Pre: 48 %
FVC-Post: 3.24 L
FVC-Pre: 3.03 L
Post FEV1/FVC ratio: 65 %
Post FEV6/FVC ratio: 100 %
Pre FEV1/FVC ratio: 70 %
Pre FEV6/FVC Ratio: 99 %
RV % pred: 140 %
RV: 3.99 L
TLC % pred: 80 %
TLC: 7.14 L

## 2023-03-10 NOTE — Patient Instructions (Signed)
Full PFT Performed Today  

## 2023-03-10 NOTE — Progress Notes (Signed)
Full PFT Performed Today  

## 2023-03-12 ENCOUNTER — Telehealth: Payer: Self-pay | Admitting: Emergency Medicine

## 2023-03-12 MED ORDER — CARVEDILOL 3.125 MG PO TABS: 3.1250 mg | ORAL_TABLET | Freq: Two times a day (BID) | ORAL | 3 refills | Status: DC

## 2023-03-12 NOTE — Telephone Encounter (Signed)
Spoke with patient's wife, gave Dr Croitoru's message: We did not see any evidence of atrial fibrillation, which is a rhythm that could cause strokes. We did find that there were fairly frequent premature beats from the bottom chamber of the heart.   Both to suppress these extra beats and maybe to help with further recovery of heart pumping strength I would like him to start taking carvedilol 3.125 mg twice daily.   It is very important to be completely abstinent from cocaine when taking medications like carvedilol.  Please let us know if this drops his blood pressure too much.  Please check blood pressure once a day and bring the log to his appointment with Edd Fabian on April 23.  She verbalized understanding. Told to call or mychart message with any questions/concerns. Prescription sent to pharmacy

## 2023-03-12 NOTE — Telephone Encounter (Signed)
Spoke with pt's wife; told of pt needing fasting blood work. They will get it done the beginning of next week. Verbalized understanding

## 2023-03-20 NOTE — Progress Notes (Signed)
Please let patient know PFTs showed severe obstructive airway disease consistent with stage 3 COPD. Id like him to start either Stiolto 2 puffs once daily or Anoro one puff daily as maintenance inhaler. Can you please send in which ever is covered under his insurance

## 2023-03-22 NOTE — Progress Notes (Deleted)
Cardiology Clinic Note   Patient Name: Raymond Barrera. Date of Encounter: 03/22/2023  Primary Care Provider:  Eliezer Lofts, MD Primary Cardiologist:  Raymond Fair, MD  Patient Profile    Raymond Barrera. 62 year old male presents the clinic today for follow-up evaluation of his hypertension and chronic systolic CHF.  Past Medical History    Past Medical History:  Diagnosis Date   Ambulates with cane    straight - uses occasionally   Anxiety    due to the stroke   Arthritis    Back pain    hx of buldging disc   Complication of anesthesia    took a while for him to wake up after previous anesthesia   Diabetes mellitus without complication    Family history of adverse reaction to anesthesia    "sometimes mom has a hard time waking up"   High cholesterol    takes Zocor daily   Hypertension    takes Benazepril and HCTZ  daily   Joint pain    Joint swelling    Memory impairment    occassional - from stroke   Myocardial infarction 1987   Pneumonia    hx of-80's   Shortness of breath dyspnea    do to pain   Sleep apnea    never had a sleep study,but states Dr. Janna Barrera says he has it   Slurred speech    Stroke 08/2013   7 mini-strokes, last stroke 2017   TIA (transient ischemic attack) 2014   x 7    Urinary frequency    Past Surgical History:  Procedure Laterality Date   ABDOMINAL EXPOSURE N/A 06/16/2018   Procedure: ABDOMINAL EXPOSURE;  Surgeon: Raymond Earthly, MD;  Location: [facility] OR;  Service: Vascular;  Laterality: N/A;   ANKLE SURGERY  2008   left ankle-otif-[facility]   ANTERIOR LUMBAR FUSION Bilateral 06/16/2018   Procedure: LUMBAR 4-5 LUMBAR 5-SACRUM 1 ANTERIOR LUMBAR INTERBODY FUSION WITH INSTRUMENTATION AND ALLOGRAFT;  Surgeon: Raymond Bamberg, MD;  Location: [facility] OR;  Service: Orthopedics;  Laterality: Bilateral;   BACK SURGERY     HEMATOMA EVACUATION Left 08/13/2018   Procedure: EVACUATION HEMATOMA LEFT ABDOMINAL WALL;  Surgeon: Raymond Earthly, MD;   Location: [facility] OR;  Service: Vascular;  Laterality: Left;   IR RADIOLOGIST EVAL & MGMT  07/27/2018   IR US GUIDE BX ASP/DRAIN  07/14/2018   JOINT REPLACEMENT     both hips replaced    LUMBAR LAMINECTOMY/DECOMPRESSION MICRODISCECTOMY Right 10/17/2014   Procedure: LUMBAR LAMINECTOMY/DECOMPRESSION MICRODISCECTOMY 2 LEVELS;  Surgeon: Raymond Renshaw, MD;  Location: [facility] NEURO ORS;  Service: Neurosurgery;  Laterality: Right;  Right L45 L5S1 laminectomy and foraminotomy   MASS EXCISION  09/13/2012   Procedure: EXCISION MASS;  Surgeon: Raymond Heading, MD;  Location: [facility] ORS;  Service: General;  Laterality: N/A;   ROBOTIC ASSITED PARTIAL NEPHRECTOMY Left 03/30/2019   Procedure: XI ROBOTIC ASSITED PARTIAL NEPHRECTOMY POSSIBLE RADICAL NEPHRECTOMY;  Surgeon: Raymond Paci, MD;  Location: [facility] ORS;  Service: Urology;  Laterality: Left;   SHOULDER ARTHROSCOPY WITH ROTATOR CUFF REPAIR Right 04/12/2020   Procedure: right shoulder arthroscopy, debridement, mini open rotator cuff tear repair;  Surgeon: Raymond Copa, MD;  Location: [facility] OR;  Service: Orthopedics;  Laterality: Right;   TONSILLECTOMY     TOTAL HIP ARTHROPLASTY Right 03/20/2015   TOTAL HIP ARTHROPLASTY Right 03/20/2015   Procedure: TOTAL HIP ARTHROPLASTY ANTERIOR APPROACH;  Surgeon: Raymond Apley, MD;  Location: [facility]  OR;  Service: Orthopedics;  Laterality: Right;   TOTAL HIP ARTHROPLASTY Left 01/08/2016   Procedure: TOTAL HIP ARTHROPLASTY ANTERIOR APPROACH;  Surgeon: Raymond Apley, MD;  Location: [facility] OR;  Service: Orthopedics;  Laterality: Left;    Allergies  Allergies  Allergen Reactions   Poison Ivy Extract [Poison Ivy Extract] Itching and Rash    History of Present Illness    Raymond Barrera. has a PMH of hypertension, diabetes, hyperlipidemia on statin therapy, CVA 3/18, lumbar surgery, and moderate CKD following nephrectomy, and TIA 01/13/2023.  He has never undergone cardiac catheterization.  He did have normal perfusion  stress test in 2019.  He had a chest CT angio 3/18 to rule out PE.  He was noted to have limited coronary atherosclerosis which was primarily seen in his mid LAD.  He was also noted to have ascending aorta that was mildly dilated at 4.2 cm.  Of note he has not had alcohol in 30 years.  He does have a significant history of cocaine use.  His urine drug screens have been positive in  2014, 18, 21, 22, February 24 and March 24.  He underwent MRI/MRA of his head which showed no evidence of acute CVA or acute large vessel occlusion.  He was noted to have several distal left anterior communicating artery stenosis, and moderate left proximal P2 segment posterior cerebral artery stenosis.  His events occurred in the setting of cocaine induced hypertensive urgency.  He underwent repeat echocardiogram during his hospitalization which showed a reduced EF to 30-35% with global hypokinesis.  His previous echocardiogram showed an EF of 40-45%.  Of note over the last 10 years his EF has varied up and down.  It has been as high as 50-55% after he started Raymond Barrera treatment in 2019.  However, his Raymond Barrera was stopped due to renal dysfunction and symptomatic orthostatic hypotension after he underwent nephrectomy for renal cell carcinoma 4/20.  He had syncopal event 5/20.  There was no evidence of significant arrhythmia on event monitor.  He was seen in follow-up by Dr. Royann Barrera on 02/17/2023.  He was euvolemic at that time.  His hospitalization was reviewed.  He was continued on Jardiance and ramipril.  His cardiac event monitor showed occasional PVCs and triggered events correlated with periods of frequent PVCs.  Minimum heart rate 58, maximum heart rate 154, average heart rate 85, predominant underlying rhythm sinus, first-degree AV block was present, isolated SVE's, no SVE couplets.  Ventricular bigeminy and trigeminy present.  He presents to the clinic today for follow-up evaluation and states***.  *** denies chest pain,  shortness of breath, lower extremity edema, fatigue, palpitations, melena, hematuria, hemoptysis, diaphoresis, weakness, presyncope, syncope, orthopnea, and PND.  Chronic systolic CHF-euvolemic today.  Weight stable.  NYHA class I-2.  Limited in medical therapy due to elevated creatinine in the presence of nephrectomy.  Not a candidate for Entresto.  Previously noted to have fluctuation in his EF (variation up and down over the past 10 years).  Reports absence of cocaine use. Continue ramipril, Jardiance, carvedilol Plan for repeat echocardiogram in 2-3 months.  Hyperlipidemia-LDL direct 107.9 on 2 1424. High-fiber diet Increase physical activity as tolerated  Essential hypertension-BP today*** Heart healthy low-sodium high-fiber diet-salty 6 given Continue ramipril, carvedilol  History of TIA-multiple episodes.  Felt to be related to accelerated hypertension in the setting of cocaine use.  Neurologically intact. Follows with neurology  CKD stage IIIb-avoid nephrotoxic agents. Follows with PCP  Disposition: Follow-up with Dr. Royann Barrera  or me in 4 to 6 months.  Home Medications    Prior to Admission medications   Medication Sig Start Date End Date Taking? Authorizing Provider  carvedilol (COREG) 3.125 MG tablet Take 1 tablet (3.125 mg total) by mouth 2 (two) times daily. 03/12/23   Croitoru, Mihai, MD  clopidogrel (PLAVIX) 75 MG tablet Take 1 tablet (75 mg total) by mouth daily. 02/17/23   Drema Dallas, DO  gabapentin (NEURONTIN) 100 MG capsule Take 100 mg by mouth 2 (two) times daily.    [provider]  glucose 4 GM chewable tablet Chew 1 tablet by mouth as needed for low blood sugar.    [provider]  JARDIANCE 10 MG TABS tablet Take 10 mg by mouth daily. 11/27/22   [provider]  lidocaine (LIDODERM) 5 % Place 1 patch onto the skin daily. Remove & Discard patch within 12 hours or as directed by MD Patient taking differently: Place 1 patch onto the skin  daily as needed (PAIN). Remove & Discard patch within 12 hours or as directed by MD 07/10/19   Aviva Kluver B, PA-C  ondansetron (ZOFRAN) 4 MG tablet Take 1 tablet (4 mg total) by mouth every 8 (eight) hours as needed for nausea or vomiting. 03/30/19   Harrie Foreman, PA-C  ramipril (ALTACE) 10 MG capsule Take 10 mg by mouth daily. 11/27/22   [provider]    Family History    Family History  Problem Relation Age of Onset   Heart disease Father    He indicated that his mother is alive. He indicated that his father is deceased. He indicated that his maternal grandmother is deceased. He indicated that his maternal grandfather is deceased. He indicated that his paternal grandmother is deceased. He indicated that his paternal grandfather is deceased.  Social History    Social History   Socioeconomic History   Marital status: Married    Spouse name: Not on file   Number of children: Not on file   Years of education: Not on file   Highest education level: Not on file  Occupational History   Not on file  Tobacco Use   Smoking status: Some Days    Packs/day: 0.25    Years: 47.00    Additional pack years: 0.00    Total pack years: 11.75    Types: Cigarettes   Smokeless tobacco: Never   Tobacco comments:    Currently smokes about 2 cigarettes every other day.  03/03/23 am  Vaping Use   Vaping Use: Never used  Substance and Sexual Activity   Alcohol use: Not Currently    Comment: quit 2012   Drug use: Not Currently    Types: Cocaine    Comment: many yrs ago., last time- late 2016   Sexual activity: Yes  Other Topics Concern   Not on file  Social History Narrative   Are you right handed or left handed? Right   Are you currently employed ? disabled   What is your current occupation?   Do you live at home alone? NO   Who lives with you? Mother, Wife   What type of home do you live in: 1 story or 2 story? 1       Social Determinants of Health   Financial Resource  Strain: Not on file  Food Insecurity: No Food Insecurity (01/13/2023)   Hunger Vital Sign    Worried About Running Out of Food in the Last Year: Never true  Ran Out of Food in the Last Year: Never true  Transportation Needs: No Transportation Needs (01/13/2023)   PRAPARE - Administrator, Civil Service (Medical): No    Lack of Transportation (Non-Medical): No  Physical Activity: Not on file  Stress: Not on file  Social Connections: Not on file  Intimate Partner Violence: Not At Risk (01/13/2023)   Humiliation, Afraid, Rape, and Kick questionnaire    Fear of Current or Ex-Partner: No    Emotionally Abused: No    Physically Abused: No    Sexually Abused: No     Review of Systems    General:  No chills, fever, night sweats or weight changes.  Cardiovascular:  No chest pain, dyspnea on exertion, edema, orthopnea, palpitations, paroxysmal nocturnal dyspnea. Dermatological: No rash, lesions/masses Respiratory: No cough, dyspnea Urologic: No hematuria, dysuria Abdominal:   No nausea, vomiting, diarrhea, bright red blood per rectum, melena, or hematemesis Neurologic:  No visual changes, wkns, changes in mental status. All other systems reviewed and are otherwise negative except as noted above.  Physical Exam    VS:  There were no vitals taken for this visit. , BMI There is no height or weight on file to calculate BMI. GEN: Well nourished, well developed, in no acute distress. HEENT: normal. Neck: Supple, no JVD, carotid bruits, or masses. Cardiac: RRR, no murmurs, rubs, or gallops. No clubbing, cyanosis, edema.  Radials/DP/PT 2+ and equal bilaterally.  Respiratory:  Respirations regular and unlabored, clear to auscultation bilaterally. GI: Soft, nontender, nondistended, BS + x 4. MS: no deformity or atrophy. Skin: warm and dry, no rash. Neuro:  Strength and sensation are intact. Psych: Normal affect.  Accessory Clinical Findings    Recent Labs: 02/04/2023: ALT 16; BUN  32; Creatinine, Ser 1.82; Hemoglobin 10.4; Platelets 282; Potassium 4.3; Sodium 135   Recent Lipid Panel    Component Value Date/Time   CHOL 200 01/14/2023 0318   TRIG 548 (H) 01/14/2023 0318   HDL 28 (L) 01/14/2023 0318   CHOLHDL 7.1 01/14/2023 0318   VLDL UNABLE TO CALCULATE IF TRIGLYCERIDE OVER 400 mg/dL 16/09/9603 5409   LDLCALC UNABLE TO CALCULATE IF TRIGLYCERIDE OVER 400 mg/dL 81/19/1478 2956   LDLDIRECT 107.9 (H) 01/14/2023 0318    No BP recorded.  {Refresh Note OR Click here to enter BP  :1}***    ECG personally reviewed by me today- *** - No acute changes  Echocardiogram 01/13/2023  IMPRESSIONS     1. Left ventricular ejection fraction, by estimation, is 30 to 35%. The  left ventricle has moderately decreased function. The left ventricle  demonstrates global hypokinesis. There is mild concentric left ventricular  hypertrophy. Left ventricular  diastolic parameters are consistent with Grade I diastolic dysfunction  (impaired relaxation).   2. Right ventricular systolic function is normal. The right ventricular  size is normal. Tricuspid regurgitation signal is inadequate for assessing  PA pressure.   3. The mitral valve is normal in structure. Trivial mitral valve  regurgitation.   4. The aortic valve is tricuspid. There is mild calcification of the  aortic valve. There is mild thickening of the aortic valve. Aortic valve  regurgitation is not visualized. Aortic valve sclerosis/calcification is  present, without any evidence of  aortic stenosis.   5. Aortic dilatation noted. There is borderline dilatation of the aortic  root, measuring 38 mm. There is mild dilatation of the ascending aorta,  measuring 41 mm.   6. The inferior vena cava is normal in size with  greater than 50%  respiratory variability, suggesting right atrial pressure of 3 mmHg.   Comparison(s): Compared to prior TTE, the EF has decreased from 40-45% to  ~35%. Otherwise, there is no significant  change.   Conclusion(s)/Recommendation(s): No intracardiac source of embolism  detected on this transthoracic study. Consider a transesophageal  echocardiogram to exclude cardiac source of embolism if clinically  indicated.   FINDINGS   Left Ventricle: Left ventricular ejection fraction, by estimation, is 30  to 35%. The left ventricle has moderately decreased function. The left  ventricle demonstrates global hypokinesis. The left ventricular internal  cavity size was normal in size.  There is mild concentric left ventricular hypertrophy. Left ventricular  diastolic parameters are consistent with Grade I diastolic dysfunction  (impaired relaxation).   Right Ventricle: The right ventricular size is normal. No increase in  right ventricular wall thickness. Right ventricular systolic function is  normal. Tricuspid regurgitation signal is inadequate for assessing PA  pressure.   Left Atrium: Left atrial size was normal in size.   Right Atrium: Right atrial size was normal in size.   Pericardium: There is no evidence of pericardial effusion.   Mitral Valve: The mitral valve is normal in structure. Trivial mitral  valve regurgitation.   Tricuspid Valve: The tricuspid valve is normal in structure. Tricuspid  valve regurgitation is trivial.   Aortic Valve: The aortic valve is tricuspid. There is mild calcification  of the aortic valve. There is mild thickening of the aortic valve. Aortic  valve regurgitation is not visualized. Aortic valve  sclerosis/calcification is present, without any  evidence of aortic stenosis. Aortic valve mean gradient measures 4.0 mmHg.  Aortic valve peak gradient measures 7.3 mmHg. Aortic valve area, by VTI  measures 2.31 cm.   Pulmonic Valve: The pulmonic valve was not well visualized.   Aorta: Aortic dilatation noted. There is borderline dilatation of the  aortic root, measuring 38 mm. There is mild dilatation of the ascending  aorta, measuring 41  mm.   Venous: The inferior vena cava is normal in size with greater than 50%  respiratory variability, suggesting right atrial pressure of 3 mmHg.   IAS/Shunts: The atrial septum is grossly normal.    Assessment & Plan   1.  ***   Thomasene Ripple. Jessenya Berdan NP-C     03/22/2023, 11:13 AM [facility] [address] Office [phone] Fax [phone]    I spent***minutes examining this patient, reviewing medications, and using patient centered shared decision making involving her cardiac care.  Prior to her visit I spent greater than 20 minutes reviewing her past medical history,  medications, and prior cardiac tests.

## 2023-03-24 ENCOUNTER — Ambulatory Visit: Payer: Medicaid Other | Admitting: General Practice

## 2023-03-26 ENCOUNTER — Telehealth: Payer: Self-pay | Admitting: Cardiovascular Disease

## 2023-03-26 ENCOUNTER — Encounter: Payer: Self-pay | Admitting: *Deleted

## 2023-03-26 NOTE — Telephone Encounter (Signed)
Pt c/o medication issue:  1. Name of Medication:   carvedilol (COREG) 3.125 MG tablet    2. How are you currently taking this medication (dosage and times per day)? As written   3. Are you having a reaction (difficulty breathing--STAT)? No   4. What is your medication issue? Pt spouse called in stating pt has been extremely dizzy since taking this medication and itchy. She states he doesn't have a rash anywhere but he itches all over. Please advise.

## 2023-03-26 NOTE — Telephone Encounter (Signed)
Spoke w patient's wife. Adv to stop carvedilol and I will forward to his primary cardiologist for recommendations.  Will also route to PharmD to confirm possible side effects of medication.    Dizziness is worse w standing/moving around but also present at rest.  blood pressure 174/76.  No rash or hives present but she said he is itchy all over including his eyes, arms, and chest.

## 2023-03-26 NOTE — Telephone Encounter (Signed)
Carvedilol discontinued.  Pt was to follow up in one month but cancelled most recent appointment.   Will send message to scheduling to arrange his follow up.

## 2023-03-26 NOTE — Telephone Encounter (Signed)
Lets stop that medication and wait for the symptoms to resolve before we think of an alternative medication.

## 2023-03-27 NOTE — Telephone Encounter (Signed)
Call and spoke with wife.  Patient scheduled with PA [place] May 10 at 10:05

## 2023-03-30 ENCOUNTER — Encounter: Payer: Self-pay | Admitting: Emergency Medicine

## 2023-04-07 NOTE — Progress Notes (Deleted)
Cardiology Clinic Note   Date: 04/07/2023 ID: Raymond Binet., DOB 05-Aug-1961, MRN [id number]  Primary Cardiologist:  Thurmon Fair, MD  Patient Profile    Raymond Alvidrez. is a 62 y.o. male who presents to the clinic today for ***  Past medical history significant for: Chronic systolic heart failure. Echo 01/13/2023: EF 30 to 35%.  Global hypokinesis.  Mild concentric LVH.  Grade I DD.  Trivial MR.  Aortic valve sclerosis/calcification without stenosis.  Borderline dilatation of aortic root 38 mm.  Mild dilatation of ascending aorta 41 mm.  EF decreased from 40 to 45% prior study 10/08/2020. Hypertension. Hyperlipidemia. Lipid panel 01/14/2023: LDL 108, HDL 28, TG 548, total 200. CVA/TIA. 2014: Multiple "mini strokes" in the setting of severe hypertension. March 2018: Left internal capsule ischemic stroke. MRI/MRA head 01/13/2023: Presented with weakness of the right hand and mild worsening of his speech.  No evidence of acute stroke or acute large vessel occlusion.  There are distal left anterior communicating artery stenosis, moderate left proximal P2 segment posterior cerebral artery stenosis, severe distal right intradural vertebral artery stenosis and moderate distal left middle cerebral artery stenosis.  Events occurred in the setting of cocaine induced hypertensive urgency. 7 day Zio 03/09/2023: Occasional monomorphic PVCs (2%). No high-grade ventricular arrhythmia. Symptoms attributable to PVCs.  T2DM. Renal cell carcinoma. S/p nephrectomy April 2020. CKD stage III. Substance abuse. Tobacco abuse.   History of Present Illness    Raymond Grober. was first evaluated by Dr. Royann Shivers on 06/14/2018 for preoperative risk assessment and abnormal EKG at the request of Raymond Poles, PA-C (anesthesiology). He is followed for the above outlined history.   Was last seen in the office by Dr. Royann Shivers on 02/17/2023 following hospitalization for TIA on 01/13/2023.  MRI/MRA was performed  which did not show acute stroke/large vessel occlusion but several intracranial circulation abnormalities were identified (outlined above).  Echo showed decreased LV function.  7-day event monitor to evaluate for A-fib given recent TIA showed PVCs (2% burden) and no high-grade ventricular arrhythmia.  On carvedilol but unfortunately developed itching and orthostasis so it was discontinued.  Today, patient ***  Chronic systolic heart failure.  Echo February 2024 showed EF 30 to 35%, concentric LVH, Grade I DD.  Patient*** Continue ramipril, Jardiance.  GDMT limited by kidney function. Recent addition of carvedilol stopped secondary to itching and orthostasis.  Hypertension. BP today *** Patient denies headaches, dizziness or vision changes. Continue ramipril.  Aortic dilatation. Echo 2024 showed borderline dilatation of aortic root 38 mm and mild dilatation of ascending aorta 41 mm. Patient ***  CVA/TIA.  Most recent TIA February 2024 with MRI/MRA findings of several intracranial circulation abnormalities. Patient *** Continue Plavix. Followed by neurology.  Hyperlipidemia/hypertriglyceridemia. LDL February 2024 108, TG 086. Patient was not sure if he was fasting for last lipid panel. Will get fasting panel today. *** Substance abuse. Patient with history of cocaine use. He *** Tobacco abuse. Patient ***   ROS: All other systems reviewed and are otherwise negative except as noted in History of Present Illness.  Studies Reviewed    ECG personally reviewed by me today: ***  No significant changes from ***  Risk Assessment/Calculations    {Does this patient have ATRIAL FIBRILLATION?:[phone]} No BP recorded.  {Refresh Note OR Click here to enter BP  :1}***        Physical Exam    VS:  There were no vitals taken for this visit. , BMI There  is no height or weight on file to calculate BMI.  GEN: Well nourished, well developed, in no acute distress. Neck: No JVD or carotid bruits. Cardiac:  *** RRR. No murmurs. No rubs or gallops.   Respiratory:  Respirations regular and unlabored. Clear to auscultation without rales, wheezing or rhonchi. GI: Soft, nontender, nondistended. Extremities: Radials/DP/PT 2+ and equal bilaterally. No clubbing or cyanosis. No edema ***  Skin: Warm and dry, no rash. Neuro: Strength intact.  Assessment & Plan   ***  Disposition: ***     {Are you ordering a CV Procedure (e.g. stress test, cath, DCCV, TEE, etc)?   Press F2        :[id number]}   Signed, Etta Grandchild. Jeris Roser, DNP, NP-C

## 2023-04-08 ENCOUNTER — Ambulatory Visit: Payer: Medicaid Other | Attending: Primary Care | Admitting: Pulmonary Disease

## 2023-04-08 DIAGNOSIS — G4761 Periodic limb movement disorder: Secondary | ICD-10-CM | POA: Diagnosis not present

## 2023-04-08 DIAGNOSIS — G4733 Obstructive sleep apnea (adult) (pediatric): Secondary | ICD-10-CM | POA: Insufficient documentation

## 2023-04-08 DIAGNOSIS — I639 Cerebral infarction, unspecified: Secondary | ICD-10-CM

## 2023-04-08 DIAGNOSIS — R0683 Snoring: Secondary | ICD-10-CM

## 2023-04-09 DIAGNOSIS — G4733 Obstructive sleep apnea (adult) (pediatric): Secondary | ICD-10-CM

## 2023-04-09 DIAGNOSIS — R0683 Snoring: Secondary | ICD-10-CM

## 2023-04-09 NOTE — Procedures (Signed)
Patient Name: Lavalle, Kenna Date: 04/08/2023 Gender: Male D.O.B: 02-Nov-1961 Age (years): 40 Referring Provider: Ames Dura NP Height (inches): 78 Interpreting Physician: Cyril Mourning MD, ABSM Weight (lbs): 265 RPSGT: Alfonso Ellis BMI: 31 MRN: [id number] Neck Size: 17.50 <br> <br> CLINICAL INFORMATION Sleep Study Type: NPSG    Indication for sleep study: loud snoring & witnessed apneas    Epworth Sleepiness Score:7    SLEEP STUDY TECHNIQUE As per the AASM Manual for the Scoring of Sleep and Associated Events v2.3 (April 2016) with a hypopnea requiring 4% desaturations.  The channels recorded and monitored were frontal, central and occipital EEG, electrooculogram (EOG), submentalis EMG (chin), nasal and oral airflow, thoracic and abdominal wall motion, anterior tibialis EMG, snore microphone, electrocardiogram, and pulse oximetry.  MEDICATIONS Medications self-administered by patient taken the night of the study : FLEXERIL  SLEEP ARCHITECTURE The study was initiated at 11:04:57 PM and ended at 5:31:50 AM.  Sleep onset time was 80.8 minutes and the sleep efficiency was 45.5%. The total sleep time was 176 minutes.  Stage REM latency was 132.5 minutes.  The patient spent 22.44% of the night in stage N1 sleep, 57.95% in stage N2 sleep, 13.07% in stage N3 and 6.5% in REM.  Alpha intrusion was absent.  Supine sleep was 2.33%.  RESPIRATORY PARAMETERS The overall apnea/hypopnea index (AHI) was 5.5 per hour. There were 1 total apneas, including 0 obstructive, 1 central and 0 mixed apneas. There were 15 hypopneas and 0 RERAs.  The AHI during Stage REM sleep was 0.0 per hour.  AHI while supine was 29.2 per hour.  The mean oxygen saturation was 94.00%. The minimum SpO2 during sleep was 83.00%.  moderate snoring was noted during this study.  CARDIAC DATA The 2 lead EKG demonstrated sinus rhythm. The mean heart rate was 76.37 beats per minute. Other EKG findings  include: None.   LEG MOVEMENT DATA The total PLMS were 860 with a resulting PLMS index of 293.18. Associated arousal with leg movement index was 33.4 .  IMPRESSIONS - Very Mild obstructive sleep apnea occurred during this study (AHI = 5.5/h). - No significant central sleep apnea occurred during this study (CAI = 0.3/h). - Mild oxygen desaturation was noted during this study (Min O2 = 83.00%). - The patient snored with moderate snoring volume. - No cardiac abnormalities were noted during this study. - Severe periodic limb movements of sleep occurred during the study. Associated arousals were significant.   DIAGNOSIS - Obstructive Sleep Apnea (G47.33) - Periodic Limb Movement During Sleep (G47.61)   RECOMMENDATIONS - Very mild obstructive sleep apnea. Return to discuss treatment options. Positional therapy avoiding supine position during sleep may suffice . - Consider treatment of Periodic Leg Movements of Sleep. - Avoid alcohol, sedatives and other CNS depressants that may worsen sleep apnea and disrupt normal sleep architecture. - Sleep hygiene should be reviewed to assess factors that may improve sleep quality. - Weight management and regular exercise should be initiated or continued if appropriate.   Cyril Mourning MD Board Certified in Sleep medicine

## 2023-04-10 ENCOUNTER — Ambulatory Visit: Payer: Medicaid Other | Admitting: Student

## 2023-04-28 ENCOUNTER — Telehealth: Payer: Self-pay | Admitting: Pulmonary Disease

## 2023-04-28 NOTE — Progress Notes (Signed)
Patient has very mild OSA and mild oxygen desaturations. Please set up visit to discuss if patient is symptomatic and would like to review available treatment options including oral appliance or CPAP

## 2023-04-28 NOTE — Telephone Encounter (Signed)
disregard

## 2023-05-08 ENCOUNTER — Telehealth: Payer: Self-pay | Admitting: Cardiovascular Disease

## 2023-05-08 NOTE — Telephone Encounter (Signed)
Wife stated patient will need new orders to get fasting lab done in [place].

## 2023-05-08 NOTE — Telephone Encounter (Signed)
Confirmed with wife that he is having labs done at [facility] in [place].  Advised they are still current and nothing new needs to be ordered. She states understanding.

## 2023-05-13 ENCOUNTER — Ambulatory Visit: Payer: Medicaid Other | Admitting: Student

## 2023-05-28 ENCOUNTER — Encounter: Payer: Self-pay | Admitting: Primary Care

## 2023-05-28 ENCOUNTER — Ambulatory Visit (INDEPENDENT_AMBULATORY_CARE_PROVIDER_SITE_OTHER): Payer: Medicaid Other | Admitting: Primary Care

## 2023-05-28 VITALS — BP 132/64 | HR 83 | Temp 98.1°F | Ht 78.0 in | Wt 248.0 lb

## 2023-05-28 DIAGNOSIS — J449 Chronic obstructive pulmonary disease, unspecified: Secondary | ICD-10-CM | POA: Insufficient documentation

## 2023-05-28 DIAGNOSIS — G4730 Sleep apnea, unspecified: Secondary | ICD-10-CM | POA: Diagnosis not present

## 2023-05-28 DIAGNOSIS — G4761 Periodic limb movement disorder: Secondary | ICD-10-CM | POA: Diagnosis not present

## 2023-05-28 MED ORDER — ROPINIROLE HCL 0.5 MG PO TABS: ORAL_TABLET | ORAL | 1 refills | Status: DC

## 2023-05-28 MED ORDER — GABAPENTIN 100 MG PO CAPS: 100.0000 mg | ORAL_CAPSULE | Freq: Two times a day (BID) | ORAL | 0 refills | Status: DC

## 2023-05-28 MED ORDER — ANORO ELLIPTA 62.5-25 MCG/ACT IN AEPB: 1.0000 | INHALATION_SPRAY | Freq: Every day | RESPIRATORY_TRACT | 3 refills | Status: DC

## 2023-05-28 NOTE — Assessment & Plan Note (Signed)
-   Patient has symptoms of RLS. He had severe periodic limb movement during sleep study with associated arousals. Discussed management with medication, symptoms are impacting his sleep quality so would recommend starting Requip 0.5mg  at bedtime. FU in 4-8 weeks or sooner if needed.

## 2023-05-28 NOTE — Assessment & Plan Note (Addendum)
-   Current smoker. Baseline dyspnea and cough. Patient has moderate-severe obstructive airway disease on pulmonary function testing/ FEV1 44% predicted. Not currently on bronchodilator regimen. Starting Anoro Ellipta one puff daily. Smoking cessation strongly encouraged.

## 2023-05-28 NOTE — Progress Notes (Signed)
Reviewed and agree with assessment/plan.   Coralyn Helling, MD [facility] Pulmonary/Critical Care 05/28/2023, 12:36 PM Pager:  [phone]

## 2023-05-28 NOTE — Patient Instructions (Addendum)
Pulmonary function test showed evidence of chronic obstructive pulmonary disease Sleep study was positive for very mild obstructive sleep apnea, at this time it is not necessary that you be started on CPAP  Recommend taking medication called Requip at bedtime for periodic limb movement Start Anoro Ellipta daily for COPD  Recommendations: Start Anoro one puff daily in the morning Take Requip at bedtime for restless leg symptoms Contact PCP for medication refills   Rx: Anoro Ellipta  Requip   Follow-up: 4-8 weeks with Beth NP or sooner if needed   Chronic Obstructive Pulmonary Disease  Chronic obstructive pulmonary disease (COPD) is a long-term (chronic) lung problem. When you have COPD, it is hard for air to get in and out of your lungs. Usually the condition gets worse over time, and your lungs will never return to normal. There are things you can do to keep yourself as healthy as possible. What are the causes? Smoking. This is the most common cause. Certain genes passed from parent to child (inherited). What increases the risk? Being exposed to secondhand smoke from cigarettes, pipes, or cigars. Being exposed to chemicals and other irritants, such as fumes and dust in the work environment. Having chronic lung conditions or infections. What are the signs or symptoms? Shortness of breath, especially during physical activity. A long-term cough with a large amount of thick mucus. Sometimes, the cough may not have any mucus (dry cough). Wheezing. Breathing quickly. Skin that looks gray or blue, especially in the fingers, toes, or lips. Feeling tired (fatigue). Weight loss. Chest tightness. Having infections often. Episodes when breathing symptoms become much worse (exacerbations). At the later stages of this disease, you may have swelling in the ankles, feet, or legs. How is this treated? Taking medicines. Quitting smoking, if you smoke. Rehabilitation. This includes steps to  make your body work better. It may involve a team of specialists. Doing exercises. Making changes to your diet. Using oxygen. Lung surgery. Lung transplant. Comfort measures (palliative care). Follow these instructions at home: Medicines Take over-the-counter and prescription medicines only as told by your doctor. Talk to your doctor before taking any cough or allergy medicines. You may need to avoid medicines that cause your lungs to be dry. Lifestyle If you smoke, stop smoking. Smoking makes the problem worse. Do not smoke or use any products that contain nicotine or tobacco. If you need help quitting, ask your doctor. Avoid being around things that make your breathing worse. This may include smoke, chemicals, and fumes. Stay active, but remember to rest as well. Learn and use tips on how to manage stress and control your breathing. Make sure you get enough sleep. Most adults need at least 7 hours of sleep every night. Eat healthy foods. Eat smaller meals more often. Rest before meals. Controlled breathing Learn and use tips on how to control your breathing as told by your doctor. Try: Breathing in (inhaling) through your nose for 1 second. Then, pucker your lips and breath out (exhale) through your lips for 2 seconds. Putting one hand on your belly (abdomen). Breathe in slowly through your nose for 1 second. Your hand on your belly should move out. Pucker your lips and breathe out slowly through your lips. Your hand on your belly should move in as you breathe out.  Controlled coughing Learn and use controlled coughing to clear mucus from your lungs. Follow these steps: Lean your head a little forward. Breathe in deeply. Try to hold your breath for 3 seconds. Keep  your mouth slightly open while coughing 2 times. Spit any mucus out into a tissue. Rest and do the steps again 1 or 2 times as needed. General instructions Make sure you get all the shots (vaccines) that your doctor  recommends. Ask your doctor about a flu shot and a pneumonia shot. Use oxygen therapy and pulmonary rehabilitation if told by your doctor. If you need home oxygen therapy, ask your doctor if you should buy a tool to measure your oxygen level (oximeter). Make a COPD action plan with your doctor. This helps you to know what to do if you feel worse than usual. Manage any other conditions you have as told by your doctor. Avoid going outside when it is very hot, cold, or humid. Avoid people who have a sickness you can catch (contagious). Keep all follow-up visits. Contact a doctor if: You cough up more mucus than usual. There is a change in the color or thickness of the mucus. It is harder to breathe than usual. Your breathing is faster than usual. You have trouble sleeping. You need to use your medicines more often than usual. You have trouble doing your normal activities such as getting dressed or walking around the house. Get help right away if: You have shortness of breath while resting. You have shortness of breath that stops you from: Being able to talk. Doing normal activities. Your chest hurts for longer than 5 minutes. Your skin color is more blue than usual. Your pulse oximeter shows that you have low oxygen for longer than 5 minutes. You have a fever. You feel too tired to breathe normally. These symptoms may represent a serious problem that is an emergency. Do not wait to see if the symptoms will go away. Get medical help right away. Call your local emergency services (911 in the [place] Do not drive yourself to the hospital. Summary Chronic obstructive pulmonary disease (COPD) is a long-term lung problem. The way your lungs work will never return to normal. Usually the condition gets worse over time. There are things you can do to keep yourself as healthy as possible. Take over-the-counter and prescription medicines only as told by your doctor. If you smoke, stop. Smoking makes  the problem worse. This information is not intended to replace advice given to you by your health care provider. Make sure you discuss any questions you have with your health care provider. Document Revised: 09/24/2020 Document Reviewed: 09/25/2020 Elsevier Patient Education  2024 Elsevier Inc.      Chronic Obstructive Pulmonary Disease  Chronic obstructive pulmonary disease (COPD) is a long-term (chronic) lung problem. When you have COPD, it is hard for air to get in and out of your lungs. Usually the condition gets worse over time, and your lungs will never return to normal. There are things you can do to keep yourself as healthy as possible. What are the causes? Smoking. This is the most common cause. Certain genes passed from parent to child (inherited). What increases the risk? Being exposed to secondhand smoke from cigarettes, pipes, or cigars. Being exposed to chemicals and other irritants, such as fumes and dust in the work environment. Having chronic lung conditions or infections. What are the signs or symptoms? Shortness of breath, especially during physical activity. A long-term cough with a large amount of thick mucus. Sometimes, the cough may not have any mucus (dry cough). Wheezing. Breathing quickly. Skin that looks gray or blue, especially in the fingers, toes, or lips. Feeling tired (fatigue). Weight loss.  Chest tightness. Having infections often. Episodes when breathing symptoms become much worse (exacerbations). At the later stages of this disease, you may have swelling in the ankles, feet, or legs. How is this treated? Taking medicines. Quitting smoking, if you smoke. Rehabilitation. This includes steps to make your body work better. It may involve a team of specialists. Doing exercises. Making changes to your diet. Using oxygen. Lung surgery. Lung transplant. Comfort measures (palliative care). Follow these instructions at home: Medicines Take  over-the-counter and prescription medicines only as told by your doctor. Talk to your doctor before taking any cough or allergy medicines. You may need to avoid medicines that cause your lungs to be dry. Lifestyle If you smoke, stop smoking. Smoking makes the problem worse. Do not smoke or use any products that contain nicotine or tobacco. If you need help quitting, ask your doctor. Avoid being around things that make your breathing worse. This may include smoke, chemicals, and fumes. Stay active, but remember to rest as well. Learn and use tips on how to manage stress and control your breathing. Make sure you get enough sleep. Most adults need at least 7 hours of sleep every night. Eat healthy foods. Eat smaller meals more often. Rest before meals. Controlled breathing Learn and use tips on how to control your breathing as told by your doctor. Try: Breathing in (inhaling) through your nose for 1 second. Then, pucker your lips and breath out (exhale) through your lips for 2 seconds. Putting one hand on your belly (abdomen). Breathe in slowly through your nose for 1 second. Your hand on your belly should move out. Pucker your lips and breathe out slowly through your lips. Your hand on your belly should move in as you breathe out.  Controlled coughing Learn and use controlled coughing to clear mucus from your lungs. Follow these steps: Lean your head a little forward. Breathe in deeply. Try to hold your breath for 3 seconds. Keep your mouth slightly open while coughing 2 times. Spit any mucus out into a tissue. Rest and do the steps again 1 or 2 times as needed. General instructions Make sure you get all the shots (vaccines) that your doctor recommends. Ask your doctor about a flu shot and a pneumonia shot. Use oxygen therapy and pulmonary rehabilitation if told by your doctor. If you need home oxygen therapy, ask your doctor if you should buy a tool to measure your oxygen level  (oximeter). Make a COPD action plan with your doctor. This helps you to know what to do if you feel worse than usual. Manage any other conditions you have as told by your doctor. Avoid going outside when it is very hot, cold, or humid. Avoid people who have a sickness you can catch (contagious). Keep all follow-up visits. Contact a doctor if: You cough up more mucus than usual. There is a change in the color or thickness of the mucus. It is harder to breathe than usual. Your breathing is faster than usual. You have trouble sleeping. You need to use your medicines more often than usual. You have trouble doing your normal activities such as getting dressed or walking around the house. Get help right away if: You have shortness of breath while resting. You have shortness of breath that stops you from: Being able to talk. Doing normal activities. Your chest hurts for longer than 5 minutes. Your skin color is more blue than usual. Your pulse oximeter shows that you have low oxygen for longer than  5 minutes. You have a fever. You feel too tired to breathe normally. These symptoms may represent a serious problem that is an emergency. Do not wait to see if the symptoms will go away. Get medical help right away. Call your local emergency services (911 in the [place] Do not drive yourself to the hospital. Summary Chronic obstructive pulmonary disease (COPD) is a long-term lung problem. The way your lungs work will never return to normal. Usually the condition gets worse over time. There are things you can do to keep yourself as healthy as possible. Take over-the-counter and prescription medicines only as told by your doctor. If you smoke, stop. Smoking makes the problem worse. This information is not intended to replace advice given to you by your health care provider. Make sure you discuss any questions you have with your health care provider. Document Revised: 09/24/2020 Document Reviewed:  09/25/2020 Elsevier Patient Education  2024 Elsevier Inc.  Ropinirole Tablets What is this medication? ROPINIROLE (roe PIN i role) treats the symptoms of Parkinson disease. It works by acting like dopamine, a substance in your body that helps manage movements and coordination. This reduces the symptoms of Parkinson, such as body stiffness and tremors. It may also be used to treat restless legs syndrome (RLS). This medicine may be used for other purposes; ask your health care provider or pharmacist if you have questions. COMMON BRAND NAME(S): Requip What should I tell my care team before I take this medication? They need to know if you have any of these conditions: Heart disease High blood pressure Kidney disease Liver disease Low blood pressure Narcolepsy Sleep apnea Tobacco use An unusual or allergic reaction to ropinirole, other medications, foods, dyes, or preservatives Pregnant or trying to get pregnant Breast-feeding How should I use this medication? Take this medication by mouth with water. Take it as directed on the prescription label. You can take it with or without food. If it upsets your stomach, take it with food. If it upsets your stomach, take it with food. Keep taking this medication unless your care team tells you to stop. Stopping it too quickly can cause serious side effects. It can also make your condition worse. Talk to your care team about the use of this medication in children. Special care may be needed. Overdosage: If you think you have taken too much of this medicine contact a poison control center or emergency room at once. NOTE: This medicine is only for you. Do not share this medicine with others. What if I miss a dose? If you miss a dose, take it as soon as you can. If it is almost time for your next dose, take only that dose. Do not take double or extra doses. What may interact with this medication? Alcohol Antihistamines for allergy, cough and  cold Certain medications for depression, anxiety, or mental health conditions Certain medications for seizures, such as phenobarbital, primidone Certain medications for sleep Ciprofloxacin Estrogen or progestin hormones Fluvoxamine General anesthetics, such as halothane, isoflurane, methoxyflurane, propofol Medications for blood pressure Medications that relax muscles for surgery Metoclopramide Opioid medications for pain Rifampin Tobacco This list may not describe all possible interactions. Give your health care provider a list of all the medicines, herbs, non-prescription drugs, or dietary supplements you use. Also tell them if you smoke, drink alcohol, or use illegal drugs. Some items may interact with your medicine. What should I watch for while using this medication? Visit your care team for regular checks on your progress. Tell  your care team if your symptoms do not start to get better or if they get worse. Do not suddenly stop taking this medication. You may develop a severe reaction. Your care team will tell you how much medication to take. If your care team wants you to stop the medication, the dose may be slowly lowered over time to avoid any side effects. This medication may affect your coordination, reaction time, or judgement. Do not drive or operate machinery until you know how this medication affects you. Sit up or stand slowly to reduce the risk of dizzy or fainting spells. Drinking alcohol with this medication can increase the risk of these side effects. When taking this medication, you may fall asleep without notice. You may be doing activities, such as driving a car, talking, or eating. You may not feel drowsy before it happens. Contact your care team right away if this happens to you. There have been reports of increased sexual urges or other strong urges, such as gambling while taking this medication. If you experience any of these while taking this medication, you should  report this to your care team as soon as possible. Your mouth may get dry. Chewing sugarless gum or sucking hard candy and drinking plenty of water may help. Contact your care team if the problem does not go away or is severe. What side effects may I notice from receiving this medication? Side effects that you should report to your care team as soon as possible: Allergic reactions--skin rash, itching, hives, swelling of the face, lips, tongue, or throat Falling asleep during daily activities Low blood pressure--dizziness, feeling faint or lightheaded, blurry vision Mood and behavior changes--anxiety, nervousness, irritability and restlessness, confusion, hallucinations, feeling distrust or suspicion of others New or worsening uncontrolled and repetitive movements of the face, mouth, or upper body Slow heartbeat--dizziness, feeling faint or lightheaded, confusion, trouble breathing, unusual weakness or fatigue Urges to engage in impulsive behaviors such as gambling, binge eating, sexual activity, or shopping in ways that are unusual for you Side effects that usually do not require medical attention (report to your care team if they continue or are bothersome): Dizziness Drowsiness Nausea Swelling of the ankles, hands, or feet Unusual weakness or fatigue Upset stomach Vomiting This list may not describe all possible side effects. Call your doctor for medical advice about side effects. You may report side effects to FDA at 1-800-FDA-1088. Where should I keep my medication? Keep out of the reach of children and pets. Store at room temperature between 20 and 25 degrees C (68 and 77 degrees F). Protect from light and moisture. Keep the container tightly closed. Get rid of any unused medication after the expiration date. To get rid of medications that are no longer needed or have expired: Take the medication to a take-back program. Check with your pharmacy or law enforcement to find a location. If  you cannot return the medication, check the label or package insert to see if the medication should be thrown out in the garbage or flushed down the toilet. If you are not sure, ask your care team. If it is safe to put it in the trash, empty the medication out of the container. Mix the medication with cat litter, dirt, coffee grounds, or other unwanted substance. Seal the mixture in a bag or container. Put it in the trash. NOTE: This sheet is a summary. It may not cover all possible information. If you have questions about this medicine, talk to your doctor, pharmacist,  or health care provider.  2024 Elsevier/Gold Standard (2022-02-26 00:00:00)

## 2023-05-28 NOTE — Progress Notes (Signed)
@Patient  ID: Raymond Binet., male    DOB: 1961-04-19, 62 y.o.   MRN: [id number]  Chief Complaint  Patient presents with   Follow-up    F/u Sleep study/PFT     Referring provider: Eliezer Lofts, MD  HPI: 62 year old male, current smoker. PMH significant for chronic systolic heart failure, CVA, HTN, diabetes, hyperlipidemia, hx renal cell carcinoma.    Previous [facility] pulmonary encounter:  03/03/2023 Patient presents today for sleep consults. Patient had a CVA in February 2024. He has associated right sided weakness and speech impairment. He has symptoms of loud snoring and witnessed apnea. Wife is concerned. He feels his sleep is restless. He will wake himself up at times snoring. Typical bedtime is between 12am-1am. His sleep schedule really can vary. It can take him hours to fall asleep. He wakes up several times a night. At times he starts his day as early as 3am. His father had sleep apnea and wore CPAP.   He is a current smoker. He smokes on average 3 cigarettes a day. He has some baseline shortness of breath and chronic cough. No pulmonary function testing on file. He ambulates with cane. He attends physical therapy three days a week. No oxygen use.   Sleep questionnaire Symptoms-  loud snoring, witnessed apnea, restless sleep  Prior sleep study- none Bedtime- 12am-1am Time to fall asleep- typically a long time/ varies  Nocturnal awakenings- several times  Out of bed/start of day- some days he is up at 3am Weight changes- no Do you operate heavy machinery- no Do you currently wear CPAP- no Do you current wear oxygen- no Epworth- 18   05/28/2023- Interim hx  Patient presents today to review sleep study results and PFTs.  Patient had split-night sleep study on 04/08/2023 that showed mild obstructive sleep apnea, AHI 5.5 an hour with SpO2 low 83%.  He had severe periodic limb movement during sleep study with associated arousals. His wife tells me that his legs are constantly  moving before going to sleep and leg movement wakes him up at night.   He has daily dyspnea symptoms and chronic cough. Current someday smoker. He is not currently on BD regimen.    Allergies  Allergen Reactions   Poison Ivy Extract [Poison Ivy Extract] Itching and Rash   Coreg [Carvedilol] Itching    Itching and dizziness    Immunization History  Administered Date(s) Administered   Influenza,inj,Quad PF,6+ Mos 10/18/2014   Moderna Sars-Covid-2 Vaccination 03/22/2020, 04/19/2020   Pneumococcal Polysaccharide-23 03/09/2013, 10/18/2014, 06/18/2018    Past Medical History:  Diagnosis Date   Ambulates with cane    straight - uses occasionally   Anxiety    due to the stroke   Arthritis    Back pain    hx of buldging disc   Complication of anesthesia    took a while for him to wake up after previous anesthesia   Diabetes mellitus without complication (HCC)    Family history of adverse reaction to anesthesia    "sometimes mom has a hard time waking up"   High cholesterol    takes Zocor daily   Hypertension    takes Benazepril and HCTZ  daily   Joint pain    Joint swelling    Memory impairment    occassional - from stroke   Myocardial infarction (HCC) 1987   Pneumonia    hx of-80's   Shortness of breath dyspnea    do to pain  Sleep apnea    never had a sleep study,but states Dr. Janna Arch says he has it   Slurred speech    Stroke (HCC) 08/2013   7 mini-strokes, last stroke 2017   TIA (transient ischemic attack) 2014   x 7    Urinary frequency     Tobacco History: Social History   Tobacco Use  Smoking Status Some Days   Packs/day: 0.25   Years: 47.00   Additional pack years: 0.00   Total pack years: 11.75   Types: Cigarettes  Smokeless Tobacco Never  Tobacco Comments   Currently smokes about 2 cigarettes every other day.  03/03/23 am   Ready to quit: Not Answered Counseling given: Not Answered Tobacco comments: Currently smokes about 2 cigarettes every  other day.  03/03/23 am   Outpatient Medications Prior to Visit  Medication Sig Dispense Refill   glucose 4 GM chewable tablet Chew 1 tablet by mouth as needed for low blood sugar.     JARDIANCE 10 MG TABS tablet Take 10 mg by mouth daily.     lidocaine (LIDODERM) 5 % Place 1 patch onto the skin daily. Remove & Discard patch within 12 hours or as directed by MD (Patient taking differently: Place 1 patch onto the skin daily as needed (PAIN). Remove & Discard patch within 12 hours or as directed by MD) 30 patch 0   ondansetron (ZOFRAN) 4 MG tablet Take 1 tablet (4 mg total) by mouth every 8 (eight) hours as needed for nausea or vomiting. 20 tablet 0   ramipril (ALTACE) 10 MG capsule Take 10 mg by mouth daily.     clopidogrel (PLAVIX) 75 MG tablet Take 1 tablet (75 mg total) by mouth daily. (Patient not taking: Reported on 05/28/2023) 30 tablet 5   gabapentin (NEURONTIN) 100 MG capsule Take 100 mg by mouth 2 (two) times daily. (Patient not taking: Reported on 05/28/2023)     No facility-administered medications prior to visit.      Review of Systems  Review of Systems  Constitutional:  Positive for fatigue.  HENT: Negative.    Respiratory:  Positive for cough and shortness of breath.   Cardiovascular: Negative.      Physical Exam  BP 132/64 (BP Location: Right Arm, Cuff Size: Normal)   Pulse 83   Temp 98.1 F (36.7 C) (Temporal)   Ht 6\' 6"  (1.981 m)   Wt 248 lb (112.5 kg)   SpO2 98%   BMI 28.66 kg/m  Physical Exam Constitutional:      Appearance: Normal appearance.  HENT:     Head: Normocephalic and atraumatic.  Cardiovascular:     Rate and Rhythm: Normal rate and regular rhythm.  Pulmonary:     Effort: Pulmonary effort is normal.     Breath sounds: Normal breath sounds. No wheezing or rales.  Skin:    General: Skin is warm and dry.  Neurological:     General: No focal deficit present.     Mental Status: He is alert and oriented to person, place, and time. Mental status  is at baseline.  Psychiatric:        Mood and Affect: Mood normal.        Behavior: Behavior normal.        Thought Content: Thought content normal.        Judgment: Judgment normal.      Lab Results:  CBC    Component Value Date/Time   WBC 7.1 02/04/2023 1400   RBC 3.86 (  L) 02/04/2023 1400   HGB 10.4 (L) 02/04/2023 1400   HCT 33.5 (L) 02/04/2023 1400   PLT 282 02/04/2023 1400   MCV 86.8 02/04/2023 1400   MCH 26.9 02/04/2023 1400   MCHC 31.0 02/04/2023 1400   RDW 15.9 (H) 02/04/2023 1400   LYMPHSABS 1.4 02/04/2023 1400   MONOABS 0.4 02/04/2023 1400   EOSABS 0.1 02/04/2023 1400   BASOSABS 0.0 02/04/2023 1400    BMET    Component Value Date/Time   NA 135 02/04/2023 1400   K 4.3 02/04/2023 1400   CL 106 02/04/2023 1400   CO2 22 02/04/2023 1400   GLUCOSE 144 (H) 02/04/2023 1400   BUN 32 (H) 02/04/2023 1400   CREATININE 1.82 (H) 02/04/2023 1400   CALCIUM 10.4 (H) 02/04/2023 1400   GFRNONAA 41 (L) 02/04/2023 1400   GFRAA 40 (L) 04/12/2020 1425    BNP    Component Value Date/Time   BNP 6.9 04/18/2019 1138    ProBNP No results found for: "PROBNP"  Imaging: No results found.   Assessment & Plan:   COPD (chronic obstructive pulmonary disease) (HCC) - Current smoker. Baseline dyspnea and cough. Patient has moderate-severe obstructive airway disease on pulmonary function testing/ FEV1 44% predicted. Not currently on bronchodilator regimen. Starting Anoro Ellipta one puff daily. Smoking cessation strongly encouraged.   Mild sleep apnea - Patient was seen for sleep consult in April due to hx CVA and symptoms of loud snoring and witnessed apnea. Split night sleep study in May 2024 showed very mild OSA, AHI 5.5/hour with SpO2 low 83%. Reviewed treatment options, risk for cardiovascular disease are minimal. Recommend trial positional sleep therapy and weight loss. If symptoms persistent consider starting CPAP. FU in 4-8 weeks or sooner if needed.   Periodic limb  movement - Patient has symptoms of RLS. He had severe periodic limb movement during sleep study with associated arousals. Discussed management with medication, symptoms are impacting his sleep quality so would recommend starting Requip 0.5mg  at bedtime. FU in 4-8 weeks or sooner if needed.     Glenford Bayley, NP 05/28/2023

## 2023-05-28 NOTE — Assessment & Plan Note (Addendum)
-   Patient was seen for sleep consult in April due to hx CVA and symptoms of loud snoring and witnessed apnea. Split night sleep study in May 2024 showed very mild OSA, AHI 5.5/hour with SpO2 low 83%. Reviewed treatment options, risk for cardiovascular disease are minimal. Recommend trial positional sleep therapy and weight loss. If symptoms persistent consider starting CPAP. FU in 4-8 weeks or sooner if needed.

## 2023-06-01 NOTE — Progress Notes (Deleted)
Cardiology Clinic Note   Patient Name: Raymond Barrera. Date of Encounter: 06/01/2023  Primary Care Provider:  Eliezer Lofts, MD Primary Cardiologist:  Thurmon Fair, MD  Patient Profile    62 year old male with hx of HTN, HL, NIDDM, and previous ischemic CVA,X 2 with most recent 01/13/2023. HFrEF echocardiogram showed worsening of LV systolic function with ejection fraction that dropped again to 30-35% with global hypokinesis in the setting of mild concentric LVH. There were no significant valvular abnormalities seen. The ascending aorta was mildly dilated at 41 mm; NICM not a good candidate for cardiac cath due to renal dysfunction.   Sherryll Burger was stopped due to renal dysfunction and symptomatic orthostatic hypotension after he underwent nephrectomy for renal cell carcinoma in April 2020 (syncope in May 2020, no evidence of significant arrhythmia on event monitoring). Hx of cocaine abuse an ongoing tobacco abuse. Last seen by Dr.Croitoru 02/17/2023. Zio monitor was placed with no evidence of atrial fib. He was started on coreg 3.125 mg BID.   Past Medical History    Past Medical History:  Diagnosis Date   Ambulates with cane    straight - uses occasionally   Anxiety    due to the stroke   Arthritis    Back pain    hx of buldging disc   Complication of anesthesia    took a while for him to wake up after previous anesthesia   Diabetes mellitus without complication (HCC)    Family history of adverse reaction to anesthesia    "sometimes mom has a hard time waking up"   High cholesterol    takes Zocor daily   Hypertension    takes Benazepril and HCTZ  daily   Joint pain    Joint swelling    Memory impairment    occassional - from stroke   Myocardial infarction (HCC) 1987   Pneumonia    hx of-80's   Shortness of breath dyspnea    do to pain   Sleep apnea    never had a sleep study,but states Dr. Janna Arch says he has it   Slurred speech    Stroke (HCC) 08/2013   7  mini-strokes, last stroke 2017   TIA (transient ischemic attack) 2014   x 7    Urinary frequency    Past Surgical History:  Procedure Laterality Date   ABDOMINAL EXPOSURE N/A 06/16/2018   Procedure: ABDOMINAL EXPOSURE;  Surgeon: Larina Earthly, MD;  Location: [facility] OR;  Service: Vascular;  Laterality: N/A;   ANKLE SURGERY  2008   left ankle-otif-[facility]   ANTERIOR LUMBAR FUSION Bilateral 06/16/2018   Procedure: LUMBAR 4-5 LUMBAR 5-SACRUM 1 ANTERIOR LUMBAR INTERBODY FUSION WITH INSTRUMENTATION AND ALLOGRAFT;  Surgeon: Estill Bamberg, MD;  Location: [facility] OR;  Service: Orthopedics;  Laterality: Bilateral;   BACK SURGERY     HEMATOMA EVACUATION Left 08/13/2018   Procedure: EVACUATION HEMATOMA LEFT ABDOMINAL WALL;  Surgeon: Larina Earthly, MD;  Location: [facility] OR;  Service: Vascular;  Laterality: Left;   IR RADIOLOGIST EVAL & MGMT  07/27/2018   IR US GUIDE BX ASP/DRAIN  07/14/2018   JOINT REPLACEMENT     both hips replaced    LUMBAR LAMINECTOMY/DECOMPRESSION MICRODISCECTOMY Right 10/17/2014   Procedure: LUMBAR LAMINECTOMY/DECOMPRESSION MICRODISCECTOMY 2 LEVELS;  Surgeon: Lisbeth Renshaw, MD;  Location: [facility];  Service: Neurosurgery;  Laterality: Right;  Right L45 L5S1 laminectomy and foraminotomy   MASS EXCISION  09/13/2012   Procedure: EXCISION MASS;  Surgeon: Redge Gainer  Lovell Sheehan, MD;  Location: [facility] ORS;  Service: General;  Laterality: N/A;   ROBOTIC ASSITED PARTIAL NEPHRECTOMY Left 03/30/2019   Procedure: XI ROBOTIC ASSITED PARTIAL NEPHRECTOMY POSSIBLE RADICAL NEPHRECTOMY;  Surgeon: Rene Paci, MD;  Location: [facility] ORS;  Service: Urology;  Laterality: Left;   SHOULDER ARTHROSCOPY WITH ROTATOR CUFF REPAIR Right 04/12/2020   Procedure: right shoulder arthroscopy, debridement, mini open rotator cuff tear repair;  Surgeon: Cammy Copa, MD;  Location: [facility] OR;  Service: Orthopedics;  Laterality: Right;   TONSILLECTOMY     TOTAL HIP ARTHROPLASTY Right 03/20/2015   TOTAL HIP ARTHROPLASTY Right  03/20/2015   Procedure: TOTAL HIP ARTHROPLASTY ANTERIOR APPROACH;  Surgeon: Sheral Apley, MD;  Location: [facility] OR;  Service: Orthopedics;  Laterality: Right;   TOTAL HIP ARTHROPLASTY Left 01/08/2016   Procedure: TOTAL HIP ARTHROPLASTY ANTERIOR APPROACH;  Surgeon: Sheral Apley, MD;  Location: [facility] OR;  Service: Orthopedics;  Laterality: Left;    Allergies  Allergies  Allergen Reactions   Poison Ivy Extract [Poison Ivy Extract] Itching and Rash   Coreg [Carvedilol] Itching    Itching and dizziness    History of Present Illness    ***  Home Medications    Current Outpatient Medications  Medication Sig Dispense Refill   clopidogrel (PLAVIX) 75 MG tablet Take 1 tablet (75 mg total) by mouth daily. (Patient not taking: Reported on 05/28/2023) 30 tablet 5   gabapentin (NEURONTIN) 100 MG capsule Take 1 capsule (100 mg total) by mouth 2 (two) times daily. 60 capsule 0   glucose 4 GM chewable tablet Chew 1 tablet by mouth as needed for low blood sugar.     JARDIANCE 10 MG TABS tablet Take 10 mg by mouth daily.     lidocaine (LIDODERM) 5 % Place 1 patch onto the skin daily. Remove & Discard patch within 12 hours or as directed by MD (Patient taking differently: Place 1 patch onto the skin daily as needed (PAIN). Remove & Discard patch within 12 hours or as directed by MD) 30 patch 0   ondansetron (ZOFRAN) 4 MG tablet Take 1 tablet (4 mg total) by mouth every 8 (eight) hours as needed for nausea or vomiting. 20 tablet 0   ramipril (ALTACE) 10 MG capsule Take 10 mg by mouth daily.     rOPINIRole (REQUIP) 0.5 MG tablet Take 1/2 tablet at bedtime x 3 days; then increase to 1 tablet at bedtime 30 tablet 1   umeclidinium-vilanterol (ANORO ELLIPTA) 62.5-25 MCG/ACT AEPB Inhale 1 puff into the lungs daily. 60 each 3   No current facility-administered medications for this visit.     Family History    Family History  Problem Relation Age of Onset   Heart disease Father    He indicated that his  mother is alive. He indicated that his father is deceased. He indicated that his maternal grandmother is deceased. He indicated that his maternal grandfather is deceased. He indicated that his paternal grandmother is deceased. He indicated that his paternal grandfather is deceased.  Social History    Social History   Socioeconomic History   Marital status: Married    Spouse name: Not on file   Number of children: Not on file   Years of education: Not on file   Highest education level: Not on file  Occupational History   Not on file  Tobacco Use   Smoking status: Some Days    Packs/day: 0.25    Years: 47.00    Additional pack  years: 0.00    Total pack years: 11.75    Types: Cigarettes   Smokeless tobacco: Never   Tobacco comments:    Currently smokes about 2 cigarettes every other day.  03/03/23 am  Vaping Use   Vaping Use: Never used  Substance and Sexual Activity   Alcohol use: Not Currently    Comment: quit 2012   Drug use: Not Currently    Types: Cocaine    Comment: many yrs ago., last time- late 2016   Sexual activity: Yes  Other Topics Concern   Not on file  Social History Narrative   Are you right handed or left handed? Right   Are you currently employed ? disabled   What is your current occupation?   Do you live at home alone? NO   Who lives with you? Mother, Wife   What type of home do you live in: 1 story or 2 story? 1       Social Determinants of Health   Financial Resource Strain: Not on file  Food Insecurity: No Food Insecurity (01/13/2023)   Hunger Vital Sign    Worried About Running Out of Food in the Last Year: Never true    Ran Out of Food in the Last Year: Never true  Transportation Needs: No Transportation Needs (01/13/2023)   PRAPARE - Administrator, Civil Service (Medical): No    Lack of Transportation (Non-Medical): No  Physical Activity: Not on file  Stress: Not on file  Social Connections: Not on file  Intimate Partner Violence:  Not At Risk (01/13/2023)   Humiliation, Afraid, Rape, and Kick questionnaire    Fear of Current or Ex-Partner: No    Emotionally Abused: No    Physically Abused: No    Sexually Abused: No     Review of Systems    General:  No chills, fever, night sweats or weight changes.  Cardiovascular:  No chest pain, dyspnea on exertion, edema, orthopnea, palpitations, paroxysmal nocturnal dyspnea. Dermatological: No rash, lesions/masses Respiratory: No cough, dyspnea Urologic: No hematuria, dysuria Abdominal:   No nausea, vomiting, diarrhea, bright red blood per rectum, melena, or hematemesis Neurologic:  No visual changes, wkns, changes in mental status. All other systems reviewed and are otherwise negative except as noted above.       Physical Exam    VS:  There were no vitals taken for this visit. , BMI There is no height or weight on file to calculate BMI.     GEN: Well nourished, well developed, in no acute distress. HEENT: normal. Neck: Supple, no JVD, carotid bruits, or masses. Cardiac: RRR, no murmurs, rubs, or gallops. No clubbing, cyanosis, edema.  Radials/DP/PT 2+ and equal bilaterally.  Respiratory:  Respirations regular and unlabored, clear to auscultation bilaterally. GI: Soft, nontender, nondistended, BS + x 4. MS: no deformity or atrophy. Skin: warm and dry, no rash. Neuro:  Strength and sensation are intact. Psych: Normal affect.      Lab Results  Component Value Date   WBC 7.1 02/04/2023   HGB 10.4 (L) 02/04/2023   HCT 33.5 (L) 02/04/2023   MCV 86.8 02/04/2023   PLT 282 02/04/2023   Lab Results  Component Value Date   CREATININE 1.82 (H) 02/04/2023   BUN 32 (H) 02/04/2023   NA 135 02/04/2023   K 4.3 02/04/2023   CL 106 02/04/2023   CO2 22 02/04/2023   Lab Results  Component Value Date   ALT 16 02/04/2023   AST  15 02/04/2023   ALKPHOS 71 02/04/2023   BILITOT 0.7 02/04/2023   Lab Results  Component Value Date   CHOL 200 01/14/2023   HDL 28 (L)  01/14/2023   LDLCALC UNABLE TO CALCULATE IF TRIGLYCERIDE OVER 400 mg/dL 57/84/6962   LDLDIRECT 107.9 (H) 01/14/2023   TRIG 548 (H) 01/14/2023   CHOLHDL 7.1 01/14/2023    Lab Results  Component Value Date   HGBA1C 6.4 (H) 01/13/2023     Review of Prior Studies    Echocardiogram 01/13/2023 1. Left ventricular ejection fraction, by estimation, is 30 to 35%. The  left ventricle has moderately decreased function. The left ventricle  demonstrates global hypokinesis. There is mild concentric left ventricular  hypertrophy. Left ventricular  diastolic parameters are consistent with Grade I diastolic dysfunction  (impaired relaxation).   2. Right ventricular systolic function is normal. The right ventricular  size is normal. Tricuspid regurgitation signal is inadequate for assessing  PA pressure.   3. The mitral valve is normal in structure. Trivial mitral valve  regurgitation.   4. The aortic valve is tricuspid. There is mild calcification of the  aortic valve. There is mild thickening of the aortic valve. Aortic valve  regurgitation is not visualized. Aortic valve sclerosis/calcification is  present, without any evidence of  aortic stenosis.   5. Aortic dilatation noted. There is borderline dilatation of the aortic  root, measuring 38 mm. There is mild dilatation of the ascending aorta,  measuring 41 mm.   6. The inferior vena cava is normal in size with greater than 50%  respiratory variability, suggesting right atrial pressure of 3 mmHg.   Comparison(s): Compared to prior TTE, the EF has decreased from 40-45% to  ~35%. Otherwise, there is no significant change.   Conclusion(s)/Recommendation(s): No intracardiac source of embolism  detected on this transthoracic study. Consider a transesophageal  echocardiogram to exclude cardiac source of embolism if clinically  indicated.   Zio Monitor 03/09/2023 The dominant rhythm is sinus rhythm first-degree AV block, with normal circadian  variation.   Isolated premature ventricular contractions are seen representing 2% of all recorded beats.  Sometimes these occur in a pattern of bigeminy or trigeminy and there are rare ventricular couplets.  There is no evidence of ventricular tachycardia.  Although more than one QRS morphology is seen, there is one clear dominant PVC morphology.  The PVCs are clearly more active during the daytime and greatly diminished in frequency at night.   There is no meaningful atrial arrhythmia, including no atrial fibrillation.   Symptom triggered recordings generally correlate with periods of frequent PVCs.   Mildly abnormal arrhythmia monitor due to the presence of occasional monomorphic PVCs, representing 2% of all recorded beats.  There is no high-grade ventricular arrhythmia. The patient's symptoms seem to be attributable to frequent PVCs.   Patch Wear Time:  7 days and 0 hours (2024-03-23T14:31:56-0400 to 2024-03-30T14:50:13-0400)   Patient had a min HR of 58 bpm, max HR of 153 bpm, and avg HR of 85 bpm. Predominant underlying rhythm was Sinus Rhythm. First Degree AV Block was present. Slight P wave morphology changes were noted. Isolated SVEs were rare (<1.0%), and no SVE Couplets  or SVE Triplets were present. Isolated VEs were occasional (2.0%, 17007), VE Couplets were rare (<1.0%, 62), and no VE Triplets were present. Ventricular Bigeminy and Trigeminy were present.  Assessment & Plan   1.  ***     {Are you ordering a CV Procedure (e.g. stress test, cath, DCCV, TEE,  etc)?   Press F2        :[id number]}   Signed, Bettey Mare. Liborio Nixon, ANP, AACC   06/01/2023 7:41 AM      Office [phone] Fax [phone]  Notice: This dictation was prepared with Dragon dictation along with smaller phrase technology. Any transcriptional errors that result from this process are unintentional and may not be corrected upon review.

## 2023-06-08 ENCOUNTER — Ambulatory Visit: Payer: MEDICAID | Attending: Student | Admitting: Adult Health

## 2023-07-07 ENCOUNTER — Ambulatory Visit: Payer: Medicaid Other | Admitting: Primary Care

## 2023-07-14 ENCOUNTER — Ambulatory Visit (INDEPENDENT_AMBULATORY_CARE_PROVIDER_SITE_OTHER): Payer: MEDICAID | Admitting: Primary Care

## 2023-07-14 ENCOUNTER — Encounter: Payer: Self-pay | Admitting: Primary Care

## 2023-07-14 VITALS — BP 130/90 | HR 58 | Temp 98.3°F | Ht 78.0 in | Wt 248.4 lb

## 2023-07-14 DIAGNOSIS — G4761 Periodic limb movement disorder: Secondary | ICD-10-CM

## 2023-07-14 DIAGNOSIS — G4730 Sleep apnea, unspecified: Secondary | ICD-10-CM

## 2023-07-14 DIAGNOSIS — I6930 Unspecified sequelae of cerebral infarction: Secondary | ICD-10-CM

## 2023-07-14 MED ORDER — CLOPIDOGREL BISULFATE 75 MG PO TABS: 75.0000 mg | ORAL_TABLET | Freq: Every day | ORAL | 1 refills | Status: DC

## 2023-07-14 MED ORDER — GABAPENTIN 100 MG PO CAPS: 100.0000 mg | ORAL_CAPSULE | Freq: Two times a day (BID) | ORAL | 1 refills | Status: DC

## 2023-07-14 MED ORDER — TRELEGY ELLIPTA 100-62.5-25 MCG/ACT IN AEPB: 1.0000 | INHALATION_SPRAY | Freq: Every day | RESPIRATORY_TRACT | Status: DC

## 2023-07-14 MED ORDER — JARDIANCE 10 MG PO TABS: 10.0000 mg | ORAL_TABLET | Freq: Every day | ORAL | 1 refills | Status: DC

## 2023-07-14 MED ORDER — ROPINIROLE HCL 0.5 MG PO TABS: ORAL_TABLET | ORAL | 5 refills | Status: DC

## 2023-07-14 NOTE — Assessment & Plan Note (Signed)
-   Stable; Pulmonary function testing April 2024 showed moderate obstructive airway disease with moderate diffusion defect. Patient has noticed clinical improvement in dyspnea since starting an Anoro Ellipta, having a difficult time affording medications.  Patient was given paperwork to apply for patient assistance through [employer].  In the Interim, he was given sample of Trelegy 100 mcg 1 puff daily.

## 2023-07-14 NOTE — Assessment & Plan Note (Addendum)
-   Split-night sleep study May 2024 showed very mild obstructive sleep apnea, AHI 5.5 an hour with SpO2 low 83%. Not on CPAP. Encourage side sleeping position and weight loss.

## 2023-07-14 NOTE — Progress Notes (Addendum)
@Patient  ID: Raymond Barrera., male    DOB: 1961/02/16, 62 y.o.   MRN: [id number]  Chief Complaint  Patient presents with   Follow-up    Referring provider: Eliezer Lofts, MD  HPI: 62 year old male, current smoker. PMH significant for chronic systolic heart failure, CVA, HTN, diabetes, hyperlipidemia, hx renal cell carcinoma.    Previous [facility] pulmonary encounter:  03/03/2023 Patient presents today for sleep consults. Patient had a CVA in February 2024. He has associated right sided weakness and speech impairment. He has symptoms of loud snoring and witnessed apnea. Wife is concerned. He feels his sleep is restless. He will wake himself up at times snoring. Typical bedtime is between 12am-1am. His sleep schedule really can vary. It can take him hours to fall asleep. He wakes up several times a night. At times he starts his day as early as 3am. His father had sleep apnea and wore CPAP.   He is a current smoker. He smokes on average 3 cigarettes a day. He has some baseline shortness of breath and chronic cough. No pulmonary function testing on file. He ambulates with cane. He attends physical therapy three days a week. No oxygen use.   Sleep questionnaire Symptoms-  loud snoring, witnessed apnea, restless sleep  Prior sleep study- none Bedtime- 12am-1am Time to fall asleep- typically a long time/ varies  Nocturnal awakenings- several times  Out of bed/start of day- some days he is up at 3am Weight changes- no Do you operate heavy machinery- no Do you currently wear CPAP- no Do you current wear oxygen- no Epworth- 18   05/28/2023 Patient presents today to review sleep study results and PFTs.  Patient had split-night sleep study on 04/08/2023 that showed mild obstructive sleep apnea, AHI 5.5 an hour with SpO2 low 83%.  He had severe periodic limb movement during sleep study with associated arousals. His wife tells me that his legs are constantly moving before going to sleep and leg  movement wakes him up at night.   He has daily dyspnea symptoms and chronic cough. Current someday smoker. He is not currently on BD regimen.    07/14/2023 Patient presents today for CPAP compliance.  Split-night sleep study on 04/08/2023 showed mild obstructive sleep apnea, AHI 5.5 an hour with SpO2 low 83%.  Patient had severe periodic limb movement during sleep study with associated arousals.  Mild severity of his OSA CPAP not necessary at this time.  During her last office visit he was started on Requip for restless leg symptoms.Was also started patient Anoro Ellipta for moderate COPD.  He is sleeping through the night better since starting Requip. Per is his he wakes up at 2am because he is hungry. ANORO has been helping his breathing. He has a slight cough currently due to head cold. He has cut back on smoking, down to cigarettes a day.  They are having a hard time affording several of his medications due to finance issues. Changing PCP, needs refill of Plavix, Jardiance and gabapentin prescriptions.   Pulmonary function testing 03/10/23 >> FVC 3.24 (51%), FEV1 2.10 (44%), ratio 65, DLCO 23.71 (67%) Moderate obstructive airway disease, moderate diffusion defect  Allergies  Allergen Reactions   Poison Ivy Extract [Poison Ivy Extract] Itching and Rash   Coreg [Carvedilol] Itching    Itching and dizziness    Immunization History  Administered Date(s) Administered   Influenza,inj,Quad PF,6+ Mos 10/18/2014   Moderna Sars-Covid-2 Vaccination 03/22/2020, 04/19/2020   Pneumococcal Polysaccharide-23 03/09/2013,  10/18/2014, 06/18/2018    Past Medical History:  Diagnosis Date   Ambulates with cane    straight - uses occasionally   Anxiety    due to the stroke   Arthritis    Back pain    hx of buldging disc   Complication of anesthesia    took a while for him to wake up after previous anesthesia   Diabetes mellitus without complication (HCC)    Family history of adverse reaction to  anesthesia    "sometimes mom has a hard time waking up"   High cholesterol    takes Zocor daily   Hypertension    takes Benazepril and HCTZ  daily   Joint pain    Joint swelling    Memory impairment    occassional - from stroke   Myocardial infarction (HCC) 1987   Pneumonia    hx of-80's   Shortness of breath dyspnea    do to pain   Sleep apnea    never had a sleep study,but states Dr. Janna Arch says he has it   Slurred speech    Stroke (HCC) 08/2013   7 mini-strokes, last stroke 2017   TIA (transient ischemic attack) 2014   x 7    Urinary frequency     Tobacco History: Social History   Tobacco Use  Smoking Status Some Days   Current packs/day: 0.25   Average packs/day: 0.3 packs/day for 47.0 years (11.8 ttl pk-yrs)   Types: Cigarettes  Smokeless Tobacco Never  Tobacco Comments   Currently smokes about 2 cigarettes per day.  07/14/2023 hfb   Ready to quit: Not Answered Counseling given: Not Answered Tobacco comments: Currently smokes about 2 cigarettes per day.  07/14/2023 hfb   Outpatient Medications Prior to Visit  Medication Sig Dispense Refill   glucose 4 GM chewable tablet Chew 1 tablet by mouth as needed for low blood sugar.     lidocaine (LIDODERM) 5 % Place 1 patch onto the skin daily. Remove & Discard patch within 12 hours or as directed by MD (Patient taking differently: Place 1 patch onto the skin daily as needed (PAIN). Remove & Discard patch within 12 hours or as directed by MD) 30 patch 0   ondansetron (ZOFRAN) 4 MG tablet Take 1 tablet (4 mg total) by mouth every 8 (eight) hours as needed for nausea or vomiting. 20 tablet 0   ramipril (ALTACE) 10 MG capsule Take 10 mg by mouth daily.     umeclidinium-vilanterol (ANORO ELLIPTA) 62.5-25 MCG/ACT AEPB Inhale 1 puff into the lungs daily. 60 each 3   clopidogrel (PLAVIX) 75 MG tablet Take 1 tablet (75 mg total) by mouth daily. (Patient not taking: Reported on 05/28/2023) 30 tablet 5   gabapentin (NEURONTIN) 100  MG capsule Take 1 capsule (100 mg total) by mouth 2 (two) times daily. 60 capsule 0   JARDIANCE 10 MG TABS tablet Take 10 mg by mouth daily.     rOPINIRole (REQUIP) 0.5 MG tablet Take 1/2 tablet at bedtime x 3 days; then increase to 1 tablet at bedtime 30 tablet 1   No facility-administered medications prior to visit.    Review of Systems  Review of Systems  Constitutional: Negative.  Negative for fatigue.  HENT: Negative.    Respiratory:  Positive for cough. Negative for shortness of breath and wheezing.   Cardiovascular: Negative.     Physical Exam  BP (!) 130/90 (BP Location: Left Arm, Patient Position: Sitting, Cuff Size: Large)  Pulse (!) 58   Temp 98.3 F (36.8 C) (Oral)   Ht 6\' 6"  (1.981 m)   Wt 248 lb 6.4 oz (112.7 kg)   SpO2 100%   BMI 28.71 kg/m  Physical Exam Constitutional:      General: He is not in acute distress.    Appearance: Normal appearance. He is not ill-appearing.  HENT:     Head: Normocephalic and atraumatic.     Mouth/Throat:     Mouth: Mucous membranes are moist.     Pharynx: Oropharynx is clear.  Cardiovascular:     Rate and Rhythm: Normal rate and regular rhythm.  Pulmonary:     Effort: Pulmonary effort is normal.     Breath sounds: Normal breath sounds. No wheezing or rhonchi.  Musculoskeletal:        General: Normal range of motion.  Skin:    General: Skin is warm and dry.  Neurological:     General: No focal deficit present.     Mental Status: He is alert and oriented to person, place, and time. Mental status is at baseline.     Comments: Hx CVA, residual right sided weakness  Psychiatric:        Mood and Affect: Mood normal.        Behavior: Behavior normal.        Thought Content: Thought content normal.        Judgment: Judgment normal.      Lab Results:  CBC    Component Value Date/Time   WBC 7.1 02/04/2023 1400   RBC 3.86 (L) 02/04/2023 1400   HGB 10.4 (L) 02/04/2023 1400   HCT 33.5 (L) 02/04/2023 1400   PLT 282  02/04/2023 1400   MCV 86.8 02/04/2023 1400   MCH 26.9 02/04/2023 1400   MCHC 31.0 02/04/2023 1400   RDW 15.9 (H) 02/04/2023 1400   LYMPHSABS 1.4 02/04/2023 1400   MONOABS 0.4 02/04/2023 1400   EOSABS 0.1 02/04/2023 1400   BASOSABS 0.0 02/04/2023 1400    BMET    Component Value Date/Time   NA 135 02/04/2023 1400   K 4.3 02/04/2023 1400   CL 106 02/04/2023 1400   CO2 22 02/04/2023 1400   GLUCOSE 144 (H) 02/04/2023 1400   BUN 32 (H) 02/04/2023 1400   CREATININE 1.82 (H) 02/04/2023 1400   CALCIUM 10.4 (H) 02/04/2023 1400   GFRNONAA 41 (L) 02/04/2023 1400   GFRAA 40 (L) 04/12/2020 1425    BNP    Component Value Date/Time   BNP 6.9 04/18/2019 1138    ProBNP No results found for: "PROBNP"  Imaging: No results found.   Assessment & Plan:   COPD (chronic obstructive pulmonary disease) (HCC) - Stable; Pulmonary function testing April 2024 showed moderate obstructive airway disease with moderate diffusion defect. Patient has noticed clinical improvement in dyspnea since starting an Anoro Ellipta, having a difficult time affording medications.  Patient was given paperwork to apply for patient assistance through [employer].  In the Interim, he was given sample of Trelegy 100 mcg 1 puff daily.   Mild sleep apnea - Split-night sleep study May 2024 showed very mild obstructive sleep apnea, AHI 5.5 an hour with SpO2 low 83%. Not on CPAP. Encourage side sleeping position and weight loss.   Periodic limb movement - PLMs noted on sleep study with associated arousals. Sleep has improved with addition of Requip 1 mg at bedtime. Continue medication as directed.  History of CVA with residual deficit - Hx CVA in February  2024. Following with neurology. Needs refill Plavix until he can establish with primary care. Encourage smoking cessation.   Diabetes mellitus (HCC) - Glucose readings at home between 100-120. Continuing present therapy, no changes. Needs refill Jardiance until he can establish  with primary care.   Radiculopathy - Takes Gabapentin twice daily for chronic pain. Continue present therapy, no changes  - Refill provided until he can establish with PCP    HTN (hypertension) - Stable; BP 130/90 - Continue Ramapril 10mg  daily     Glenford Bayley, NP 07/14/2023

## 2023-07-14 NOTE — Assessment & Plan Note (Signed)
-   Takes Gabapentin twice daily for chronic pain. Continue present therapy, no changes  - Refill provided until he can establish with PCP

## 2023-07-14 NOTE — Assessment & Plan Note (Addendum)
-   PLMs noted on sleep study with associated arousals. Sleep has improved with addition of Requip 1 mg at bedtime. Continue medication as directed.

## 2023-07-14 NOTE — Assessment & Plan Note (Addendum)
-   Glucose readings at home between 100-120. Continuing present therapy, no changes. Needs refill Jardiance until he can establish with primary care.

## 2023-07-14 NOTE — Assessment & Plan Note (Addendum)
-   Stable; BP 130/90 - Continue Ramapril 10mg  daily

## 2023-07-14 NOTE — Assessment & Plan Note (Addendum)
-   Hx CVA in February 2024. Following with neurology. Needs refill Plavix until he can establish with primary care. Encourage smoking cessation.

## 2023-07-14 NOTE — Patient Instructions (Addendum)
You have very mild sleep apnea, focus on side sleeping position and weight loss. CPAP not needed at this point Periodic limb movements associated with arousals, requip helping with this Moderate COPD, continue Anoro one puff daily (applying for patient assistance, sample trelegy given in its place until you can fill script)   - Printed out paper work for patient assistance for [facility]  - For now take Trelegy Ellipta in its place - one puff daily in the morning (rinse mouth after use) - Continue Requip 1 tablet at bedtime for restless leg symptoms  - Refills sent to pharmacy for Plavix, Gabapentin and Jardiance, make sure to establish with new primary care for further medication management  Follow-up: - 3-4 months with Waynetta Sandy NP   Chronic Obstructive Pulmonary Disease  Chronic obstructive pulmonary disease (COPD) is a long-term (chronic) lung problem. When you have COPD, it is hard for air to get in and out of your lungs. Usually the condition gets worse over time, and your lungs will never return to normal. There are things you can do to keep yourself as healthy as possible. What are the causes? Smoking. This is the most common cause. Certain genes passed from parent to child (inherited). What increases the risk? Being exposed to secondhand smoke from cigarettes, pipes, or cigars. Being exposed to chemicals and other irritants, such as fumes and dust in the work environment. Having chronic lung conditions or infections. What are the signs or symptoms? Shortness of breath, especially during physical activity. A long-term cough with a large amount of thick mucus. Sometimes, the cough may not have any mucus (dry cough). Wheezing. Breathing quickly. Skin that looks gray or blue, especially in the fingers, toes, or lips. Feeling tired (fatigue). Weight loss. Chest tightness. Having infections often. Episodes when breathing symptoms become much worse (exacerbations). At the later stages of  this disease, you may have swelling in the ankles, feet, or legs. How is this treated? Taking medicines. Quitting smoking, if you smoke. Rehabilitation. This includes steps to make your body work better. It may involve a team of specialists. Doing exercises. Making changes to your diet. Using oxygen. Lung surgery. Lung transplant. Comfort measures (palliative care). Follow these instructions at home: Medicines Take over-the-counter and prescription medicines only as told by your doctor. Talk to your doctor before taking any cough or allergy medicines. You may need to avoid medicines that cause your lungs to be dry. Lifestyle If you smoke, stop smoking. Smoking makes the problem worse. Do not smoke or use any products that contain nicotine or tobacco. If you need help quitting, ask your doctor. Avoid being around things that make your breathing worse. This may include smoke, chemicals, and fumes. Stay active, but remember to rest as well. Learn and use tips on how to manage stress and control your breathing. Make sure you get enough sleep. Most adults need at least 7 hours of sleep every night. Eat healthy foods. Eat smaller meals more often. Rest before meals. Controlled breathing Learn and use tips on how to control your breathing as told by your doctor. Try: Breathing in (inhaling) through your nose for 1 second. Then, pucker your lips and breath out (exhale) through your lips for 2 seconds. Putting one hand on your belly (abdomen). Breathe in slowly through your nose for 1 second. Your hand on your belly should move out. Pucker your lips and breathe out slowly through your lips. Your hand on your belly should move in as you breathe out.  Controlled coughing Learn and use controlled coughing to clear mucus from your lungs. Follow these steps: Lean your head a little forward. Breathe in deeply. Try to hold your breath for 3 seconds. Keep your mouth slightly open while coughing 2  times. Spit any mucus out into a tissue. Rest and do the steps again 1 or 2 times as needed. General instructions Make sure you get all the shots (vaccines) that your doctor recommends. Ask your doctor about a flu shot and a pneumonia shot. Use oxygen therapy and pulmonary rehabilitation if told by your doctor. If you need home oxygen therapy, ask your doctor if you should buy a tool to measure your oxygen level (oximeter). Make a COPD action plan with your doctor. This helps you to know what to do if you feel worse than usual. Manage any other conditions you have as told by your doctor. Avoid going outside when it is very hot, cold, or humid. Avoid people who have a sickness you can catch (contagious). Keep all follow-up visits. Contact a doctor if: You cough up more mucus than usual. There is a change in the color or thickness of the mucus. It is harder to breathe than usual. Your breathing is faster than usual. You have trouble sleeping. You need to use your medicines more often than usual. You have trouble doing your normal activities such as getting dressed or walking around the house. Get help right away if: You have shortness of breath while resting. You have shortness of breath that stops you from: Being able to talk. Doing normal activities. Your chest hurts for longer than 5 minutes. Your skin color is more blue than usual. Your pulse oximeter shows that you have low oxygen for longer than 5 minutes. You have a fever. You feel too tired to breathe normally. These symptoms may represent a serious problem that is an emergency. Do not wait to see if the symptoms will go away. Get medical help right away. Call your local emergency services (911 in the [place] Do not drive yourself to the hospital. Summary Chronic obstructive pulmonary disease (COPD) is a long-term lung problem. The way your lungs work will never return to normal. Usually the condition gets worse over time. There  are things you can do to keep yourself as healthy as possible. Take over-the-counter and prescription medicines only as told by your doctor. If you smoke, stop. Smoking makes the problem worse. This information is not intended to replace advice given to you by your health care provider. Make sure you discuss any questions you have with your health care provider. Document Revised: 09/24/2020 Document Reviewed: 09/25/2020 Elsevier Patient Education  2024 [employer].

## 2023-07-14 NOTE — Addendum Note (Signed)
Addended by: Delrae Rend on: 07/14/2023 05:40 PM   Modules accepted: Orders

## 2023-07-15 NOTE — Progress Notes (Signed)
Reviewed and agree with assessment/plan.   Coralyn Helling, MD [facility] Pulmonary/Critical Care 07/15/2023, 8:17 AM Pager:  [phone]

## 2023-07-21 ENCOUNTER — Telehealth: Payer: Self-pay | Admitting: Primary Care

## 2023-07-21 NOTE — Telephone Encounter (Signed)
Pt wife calling in to speak with a nurse, states her husband has been given a sample of Trelegy & it is causing him a burning throat, and chest when he is breathing,  SOB & making him cough more than usual. Pt wife state he was taking Anoro previously it was fine for him. They are just waiting for the pharmacy to get it in stock.

## 2023-07-22 NOTE — Telephone Encounter (Signed)
LVM for pt to return call

## 2023-07-22 NOTE — Telephone Encounter (Signed)
He can stop Trelegy and go back to [employer]

## 2023-07-23 NOTE — Telephone Encounter (Signed)
Called patients wife back.  Informed of information per Buelah Manis, NP to go back to Anoro.  Patients wife verbalized understanding.  She states patient should have refills of Anoro at pharmacy.  Will have pharmacy submit refill request if needs further refills.  Nothing further needed at this time.

## 2023-08-04 NOTE — Progress Notes (Deleted)
NEUROLOGY FOLLOW UP OFFICE NOTE  Raymond Barrera [id number]  Assessment/Plan:   Probable MRI-negative left hemispheric stroke possibly secondary to left MCA stenosis vs small vessel disease - deficits still persists, making TIA unlikely Intracranial stenosis Hypertension Hyperlipidemia Type 2 diabetes mellitus Cocaine use Cigarette smoker Possible sleep apnea Coronary artery disease     Discontinue ASA.  Continue Plavix 75mg  daily monotherapy Secondary stroke prevention otherwise managed by PCP: Atorvastatin 40mg  daily.  LDL goal less than 70 Normotensive blood pressure Hgb A1c goal less than 7 Smoking cessation Cocaine cessation Mediterranean diet In addition to physical therapy, order occupational therapy for ongoing right arm and hand weakness Refer to sleep medicine for evaluation of sleep apnea Follow up 5 months.  Subjective:  Raymond Barrera is a 62 year old right-handed male witih HTN, HLD, DM II, CHF, sleep apnea and CAD with history of MI in 1997 and history of stroke in 2014 with residual right-sided weakness and slurred speech who follows up for TIA.   UPDATE: Current medications:  Plavix 75mg  daily, ***  Seen by Sleep Medicine.  Diagnosed with mild obstructive sleep apnea.  Using CPAP.  ***  Went to PT/OT.  ***  HISTORY: Patient has history of stroke and TIAs presenting with slurred speech and right sided weakness.  He has some mild residual right sided weakness since stroke in 2014..  On 01/13/2023, he went to the bathroom and as he tried to get up, felt dizzy and had slurred speech..  Presented to the [facility] ED as Code Stroke.  NIHSS was 4 for slight right facial droop, slight slurred speech as well as limited left leg movement due to pain (baseline right upper and lower extremity weakness).  Blood pressure was 150s/100s.  UDS was positive for cocaine.  Symptoms resolved while in ED and tPA not given.  CT head revealed no acute abnormality.  MRI of  brain revealed chronic small vessel ischemic changes but no acute infarct.  MRA of head showed severe distal left ACA stenosis, moderate left proximal P2 PCA stenosis, moderate distal left MCA stenosis and severe distal right intradural vertebral artery stenosis but no emergent large vessel occlusion.  Carotid ultrasound showed minimal wall thickening without hemodynamically significant stenosis in the bilateral ICAs with abnormal waveform morphology in the proximal left ICA.  2D Echo demonstrated LVEF 30-35%.  LDL 107.9 and Hgb A1c 6.4.  He was on ASA 81mg  prior to admission.      He still reports weakness in the right hand and leg, more than baseline.  He continues to have right facial droop which was not present prior to recent stroke.  Sometimes feels weaker in the left hand.  He feels dizzy.  He also has new short-term memory difficulties only since the hospitalization.  Prior to the stroke, he was participating in physical therapy at [facility] for back pain with left radiculopathy.  He is now using a cane which he wasn't using prior to stroke.   Years ago, he was told that he had sleep apnea but was never formally diagnosed.  He has insomnia but when he does sleep, his wife notes that he snores with pauses in breathing.  He feels excessive daytime fatigue even on days that he slept most of the night.    PAST MEDICAL HISTORY: Past Medical History:  Diagnosis Date   Ambulates with cane    straight - uses occasionally   Anxiety    due to the stroke  Arthritis    Back pain    hx of buldging disc   Complication of anesthesia    took a while for him to wake up after previous anesthesia   Diabetes mellitus without complication (HCC)    Family history of adverse reaction to anesthesia    "sometimes mom has a hard time waking up"   High cholesterol    takes Zocor daily   Hypertension    takes Benazepril and HCTZ  daily   Joint pain    Joint swelling    Memory impairment    occassional -  from stroke   Myocardial infarction (HCC) 1987   Pneumonia    hx of-80's   Shortness of breath dyspnea    do to pain   Sleep apnea    never had a sleep study,but states Dr. Janna Arch says he has it   Slurred speech    Stroke (HCC) 08/2013   7 mini-strokes, last stroke 2017   TIA (transient ischemic attack) 2014   x 7    Urinary frequency     MEDICATIONS: Current Outpatient Medications on File Prior to Visit  Medication Sig Dispense Refill   clopidogrel (PLAVIX) 75 MG tablet Take 1 tablet (75 mg total) by mouth daily. 30 tablet 1   Fluticasone-Umeclidin-Vilant (TRELEGY ELLIPTA) 100-62.5-25 MCG/ACT AEPB Inhale 1 puff into the lungs daily.     gabapentin (NEURONTIN) 100 MG capsule Take 1 capsule (100 mg total) by mouth 2 (two) times daily. 60 capsule 1   glucose 4 GM chewable tablet Chew 1 tablet by mouth as needed for low blood sugar.     JARDIANCE 10 MG TABS tablet Take 1 tablet (10 mg total) by mouth daily. 30 tablet 1   lidocaine (LIDODERM) 5 % Place 1 patch onto the skin daily. Remove & Discard patch within 12 hours or as directed by MD (Patient taking differently: Place 1 patch onto the skin daily as needed (PAIN). Remove & Discard patch within 12 hours or as directed by MD) 30 patch 0   ondansetron (ZOFRAN) 4 MG tablet Take 1 tablet (4 mg total) by mouth every 8 (eight) hours as needed for nausea or vomiting. 20 tablet 0   ramipril (ALTACE) 10 MG capsule Take 10 mg by mouth daily.     rOPINIRole (REQUIP) 0.5 MG tablet Take 1 tablet at bedtime 30 tablet 5   umeclidinium-vilanterol (ANORO ELLIPTA) 62.5-25 MCG/ACT AEPB Inhale 1 puff into the lungs daily. 60 each 3   No current facility-administered medications on file prior to visit.    ALLERGIES: Allergies  Allergen Reactions   Poison Ivy Extract [Poison Ivy Extract] Itching and Rash   Coreg [Carvedilol] Itching    Itching and dizziness    FAMILY HISTORY: Family History  Problem Relation Age of Onset   Heart disease  Father       Objective:  *** General: No acute distress.  Patient appears ***-groomed.   Head:  Normocephalic/atraumatic Eyes:  Fundi examined but not visualized Neck: supple, no paraspinal tenderness, full range of motion Heart:  Regular rate and rhythm Lungs:  Clear to auscultation bilaterally Back: No paraspinal tenderness Neurological Exam: alert and oriented.  Speech fluent and not dysarthric, language intact.  CN II-XII intact. Bulk and tone normal, muscle strength 5/5 throughout.  Sensation to light touch intact.  Deep tendon reflexes 2+ throughout, toes downgoing.  Finger to nose testing intact.  Gait normal, Romberg negative.   Shon Millet, DO  CC: ***

## 2023-08-05 ENCOUNTER — Encounter: Payer: Self-pay | Admitting: Neurology

## 2023-08-05 ENCOUNTER — Ambulatory Visit: Payer: Medicaid Other | Admitting: Neurology

## 2023-09-07 NOTE — Progress Notes (Deleted)
Cardiology Clinic Note   Patient Name: Raymond Barrera. Date of Encounter: 09/07/2023  Primary Care Provider:  Eliezer Lofts, MD Primary Cardiologist:  Thurmon Fair, MD  Patient Profile    62 year old male hx of long-standing hypertension and diabetes mellitus (not requiring insulin) and hypercholesterolemia (on statin), previous ischemic stroke (March 2018) s/p complex lumbar spine surgery, moderate CKD following nephrectomy,TIA that occurred on 01/13/2023 , NICM, HFrEF with most recent echo 01/2023  EF 30-35%, unable to take Entresto due to renal function, cocaine abuse. When last seen by Dr. Royann Shivers he was euvolemic.  Would like to start coreg when is has been off cocaine. This was started but he did not tolerate this due to dizziness per phone call on 03/26/2023.    Past Medical History    Past Medical History:  Diagnosis Date   Ambulates with cane    straight - uses occasionally   Anxiety    due to the stroke   Arthritis    Back pain    hx of buldging disc   Complication of anesthesia    took a while for him to wake up after previous anesthesia   Diabetes mellitus without complication (HCC)    Family history of adverse reaction to anesthesia    "sometimes mom has a hard time waking up"   High cholesterol    takes Zocor daily   Hypertension    takes Benazepril and HCTZ  daily   Joint pain    Joint swelling    Memory impairment    occassional - from stroke   Myocardial infarction (HCC) 1987   Pneumonia    hx of-80's   Shortness of breath dyspnea    do to pain   Sleep apnea    never had a sleep study,but states Dr. Janna Arch says he has it   Slurred speech    Stroke (HCC) 08/2013   7 mini-strokes, last stroke 2017   TIA (transient ischemic attack) 2014   x 7    Urinary frequency    Past Surgical History:  Procedure Laterality Date   ABDOMINAL EXPOSURE N/A 06/16/2018   Procedure: ABDOMINAL EXPOSURE;  Surgeon: Larina Earthly, MD;  Location: [facility] OR;  Service:  Vascular;  Laterality: N/A;   ANKLE SURGERY  2008   left ankle-otif-[facility]   ANTERIOR LUMBAR FUSION Bilateral 06/16/2018   Procedure: LUMBAR 4-5 LUMBAR 5-SACRUM 1 ANTERIOR LUMBAR INTERBODY FUSION WITH INSTRUMENTATION AND ALLOGRAFT;  Surgeon: Estill Bamberg, MD;  Location: [facility] OR;  Service: Orthopedics;  Laterality: Bilateral;   BACK SURGERY     HEMATOMA EVACUATION Left 08/13/2018   Procedure: EVACUATION HEMATOMA LEFT ABDOMINAL WALL;  Surgeon: Larina Earthly, MD;  Location: [facility] OR;  Service: Vascular;  Laterality: Left;   IR RADIOLOGIST EVAL & MGMT  07/27/2018   IR US GUIDE BX ASP/DRAIN  07/14/2018   JOINT REPLACEMENT     both hips replaced    LUMBAR LAMINECTOMY/DECOMPRESSION MICRODISCECTOMY Right 10/17/2014   Procedure: LUMBAR LAMINECTOMY/DECOMPRESSION MICRODISCECTOMY 2 LEVELS;  Surgeon: Lisbeth Renshaw, MD;  Location: [facility] ORS;  Service: Neurosurgery;  Laterality: Right;  Right L45 L5S1 laminectomy and foraminotomy   MASS EXCISION  09/13/2012   Procedure: EXCISION MASS;  Surgeon: Dalia Heading, MD;  Location: [facility] ORS;  Service: General;  Laterality: N/A;   ROBOTIC ASSITED PARTIAL NEPHRECTOMY Left 03/30/2019   Procedure: XI ROBOTIC ASSITED PARTIAL NEPHRECTOMY POSSIBLE RADICAL NEPHRECTOMY;  Surgeon: Rene Paci, MD;  Location: [facility] ORS;  Service: Urology;  Laterality: Left;   SHOULDER ARTHROSCOPY WITH ROTATOR CUFF REPAIR Right 04/12/2020   Procedure: right shoulder arthroscopy, debridement, mini open rotator cuff tear repair;  Surgeon: Cammy Copa, MD;  Location: [facility] OR;  Service: Orthopedics;  Laterality: Right;   TONSILLECTOMY     TOTAL HIP ARTHROPLASTY Right 03/20/2015   TOTAL HIP ARTHROPLASTY Right 03/20/2015   Procedure: TOTAL HIP ARTHROPLASTY ANTERIOR APPROACH;  Surgeon: Sheral Apley, MD;  Location: [facility] OR;  Service: Orthopedics;  Laterality: Right;   TOTAL HIP ARTHROPLASTY Left 01/08/2016   Procedure: TOTAL HIP ARTHROPLASTY ANTERIOR APPROACH;  Surgeon: Sheral Apley,  MD;  Location: [facility] OR;  Service: Orthopedics;  Laterality: Left;    Allergies  Allergies  Allergen Reactions   Poison Ivy Extract [Poison Ivy Extract] Itching and Rash   Coreg [Carvedilol] Itching    Itching and dizziness    History of Present Illness    Raymond Barrera comes today for ongoing assessment of NICM, HFrEF and hypertension with complications associated with CKD, TIA, and CVA, along with cocaine abuse.  Unable to tolerate Entresto, or Coreg.   Home Medications    Current Outpatient Medications  Medication Sig Dispense Refill   clopidogrel (PLAVIX) 75 MG tablet Take 1 tablet (75 mg total) by mouth daily. 30 tablet 1   Fluticasone-Umeclidin-Vilant (TRELEGY ELLIPTA) 100-62.5-25 MCG/ACT AEPB Inhale 1 puff into the lungs daily.     gabapentin (NEURONTIN) 100 MG capsule Take 1 capsule (100 mg total) by mouth 2 (two) times daily. 60 capsule 1   glucose 4 GM chewable tablet Chew 1 tablet by mouth as needed for low blood sugar.     JARDIANCE 10 MG TABS tablet Take 1 tablet (10 mg total) by mouth daily. 30 tablet 1   lidocaine (LIDODERM) 5 % Place 1 patch onto the skin daily. Remove & Discard patch within 12 hours or as directed by MD (Patient taking differently: Place 1 patch onto the skin daily as needed (PAIN). Remove & Discard patch within 12 hours or as directed by MD) 30 patch 0   ondansetron (ZOFRAN) 4 MG tablet Take 1 tablet (4 mg total) by mouth every 8 (eight) hours as needed for nausea or vomiting. 20 tablet 0   ramipril (ALTACE) 10 MG capsule Take 10 mg by mouth daily.     rOPINIRole (REQUIP) 0.5 MG tablet Take 1 tablet at bedtime 30 tablet 5   umeclidinium-vilanterol (ANORO ELLIPTA) 62.5-25 MCG/ACT AEPB Inhale 1 puff into the lungs daily. 60 each 3   No current facility-administered medications for this visit.     Family History    Family History  Problem Relation Age of Onset   Heart disease Father    He indicated that his mother is alive. He indicated that his father  is deceased. He indicated that his maternal grandmother is deceased. He indicated that his maternal grandfather is deceased. He indicated that his paternal grandmother is deceased. He indicated that his paternal grandfather is deceased.  Social History    Social History   Socioeconomic History   Marital status: Married    Spouse name: Not on file   Number of children: Not on file   Years of education: Not on file   Highest education level: Not on file  Occupational History   Not on file  Tobacco Use   Smoking status: Some Days    Current packs/day: 0.25    Average packs/day: 0.3 packs/day for 47.0 years (11.8 ttl pk-yrs)    Types: Cigarettes  Smokeless tobacco: Never   Tobacco comments:    Currently smokes about 2 cigarettes per day.  07/14/2023 hfb  Vaping Use   Vaping status: Never Used  Substance and Sexual Activity   Alcohol use: Not Currently    Comment: quit 2012   Drug use: Not Currently    Types: Cocaine    Comment: many yrs ago., last time- late 2016   Sexual activity: Yes  Other Topics Concern   Not on file  Social History Narrative   Are you right handed or left handed? Right   Are you currently employed ? disabled   What is your current occupation?   Do you live at home alone? NO   Who lives with you? Mother, Wife   What type of home do you live in: 1 story or 2 story? 1       Social Determinants of Health   Financial Resource Strain: Not on file  Food Insecurity: No Food Insecurity (01/13/2023)   Hunger Vital Sign    Worried About Running Out of Food in the Last Year: Never true    Ran Out of Food in the Last Year: Never true  Transportation Needs: No Transportation Needs (01/13/2023)   PRAPARE - Administrator, Civil Service (Medical): No    Lack of Transportation (Non-Medical): No  Physical Activity: Not on file  Stress: Not on file  Social Connections: Not on file  Intimate Partner Violence: Not At Risk (01/13/2023)   Humiliation,  Afraid, Rape, and Kick questionnaire    Fear of Current or Ex-Partner: No    Emotionally Abused: No    Physically Abused: No    Sexually Abused: No     Review of Systems    General:  No chills, fever, night sweats or weight changes.  Cardiovascular:  No chest pain, dyspnea on exertion, edema, orthopnea, palpitations, paroxysmal nocturnal dyspnea. Dermatological: No rash, lesions/masses Respiratory: No cough, dyspnea Urologic: No hematuria, dysuria Abdominal:   No nausea, vomiting, diarrhea, bright red blood per rectum, melena, or hematemesis Neurologic:  No visual changes, wkns, changes in mental status. All other systems reviewed and are otherwise negative except as noted above.       Physical Exam    VS:  There were no vitals taken for this visit. , BMI There is no height or weight on file to calculate BMI.     GEN: Well nourished, well developed, in no acute distress. HEENT: normal. Neck: Supple, no JVD, carotid bruits, or masses. Cardiac: RRR, no murmurs, rubs, or gallops. No clubbing, cyanosis, edema.  Radials/DP/PT 2+ and equal bilaterally.  Respiratory:  Respirations regular and unlabored, clear to auscultation bilaterally. GI: Soft, nontender, nondistended, BS + x 4. MS: no deformity or atrophy. Skin: warm and dry, no rash. Neuro:  Strength and sensation are intact. Psych: Normal affect.      Lab Results  Component Value Date   WBC 7.1 02/04/2023   HGB 10.4 (L) 02/04/2023   HCT 33.5 (L) 02/04/2023   MCV 86.8 02/04/2023   PLT 282 02/04/2023   Lab Results  Component Value Date   CREATININE 1.82 (H) 02/04/2023   BUN 32 (H) 02/04/2023   NA 135 02/04/2023   K 4.3 02/04/2023   CL 106 02/04/2023   CO2 22 02/04/2023   Lab Results  Component Value Date   ALT 16 02/04/2023   AST 15 02/04/2023   ALKPHOS 71 02/04/2023   BILITOT 0.7 02/04/2023   Lab Results  Component Value Date   CHOL 200 01/14/2023   HDL 28 (L) 01/14/2023   LDLCALC UNABLE TO CALCULATE IF  TRIGLYCERIDE OVER 400 mg/dL 28/41/3244   LDLDIRECT 107.9 (H) 01/14/2023   TRIG 548 (H) 01/14/2023   CHOLHDL 7.1 01/14/2023    Lab Results  Component Value Date   HGBA1C 6.4 (H) 01/13/2023     Review of Prior Studies    Echocardiogram 01/13/2023  1. Left ventricular ejection fraction, by estimation, is 30 to 35%. The  left ventricle has moderately decreased function. The left ventricle  demonstrates global hypokinesis. There is mild concentric left ventricular  hypertrophy. Left ventricular  diastolic parameters are consistent with Grade I diastolic dysfunction  (impaired relaxation).   2. Right ventricular systolic function is normal. The right ventricular  size is normal. Tricuspid regurgitation signal is inadequate for assessing  PA pressure.   3. The mitral valve is normal in structure. Trivial mitral valve  regurgitation.   4. The aortic valve is tricuspid. There is mild calcification of the  aortic valve. There is mild thickening of the aortic valve. Aortic valve  regurgitation is not visualized. Aortic valve sclerosis/calcification is  present, without any evidence of  aortic stenosis.   5. Aortic dilatation noted. There is borderline dilatation of the aortic  root, measuring 38 mm. There is mild dilatation of the ascending aorta,  measuring 41 mm.   6. The inferior vena cava is normal in size with greater than 50%  respiratory variability, suggesting right atrial pressure of 3 mmHg.   Comparison(s): Compared to prior TTE, the EF has decreased from 40-45% to  ~35%. Otherwise, there is no significant change.   Zio Monitor 03/07/2023 Patient had a min HR of 58 bpm, max HR of 153 bpm, and avg HR of 85 bpm. Predominant underlying rhythm was Sinus Rhythm. First Degree AV Block was present. Slight P wave morphology changes were noted. Isolated SVEs were rare (<1.0%), and no SVE Couplets  or SVE Triplets were present. Isolated VEs were occasional (2.0%, 17007), VE Couplets were  rare (<1.0%, 62), and no VE Triplets were present. Ventricular Bigeminy and Trigeminy were present.  Assessment & Plan   1.  ***     {Are you ordering a CV Procedure (e.g. stress test, cath, DCCV, TEE, etc)?   Press F2        :[id number]}   Signed, Bettey Mare. Liborio Nixon, ANP, AACC   09/07/2023 7:44 AM      Office [phone] Fax [phone]  Notice: This dictation was prepared with Dragon dictation along with smaller phrase technology. Any transcriptional errors that result from this process are unintentional and may not be corrected upon review.

## 2023-09-10 ENCOUNTER — Ambulatory Visit: Payer: MEDICAID | Attending: Student | Admitting: Adult Health

## 2023-09-14 ENCOUNTER — Encounter: Payer: Self-pay | Admitting: Primary Care

## 2023-09-16 ENCOUNTER — Other Ambulatory Visit: Payer: Self-pay | Admitting: Primary Care

## 2023-09-19 NOTE — Telephone Encounter (Signed)
Please advise on refill request    Please advise if these need to be deferred to PCP.

## 2023-10-01 ENCOUNTER — Ambulatory Visit (INDEPENDENT_AMBULATORY_CARE_PROVIDER_SITE_OTHER): Payer: MEDICAID | Admitting: Internal Medicine

## 2023-10-01 ENCOUNTER — Encounter: Payer: Self-pay | Admitting: Internal Medicine

## 2023-10-01 VITALS — BP 135/80 | HR 83 | Ht 78.0 in | Wt 245.6 lb

## 2023-10-01 DIAGNOSIS — I69320 Aphasia following cerebral infarction: Secondary | ICD-10-CM

## 2023-10-01 DIAGNOSIS — N1832 Chronic kidney disease, stage 3b: Secondary | ICD-10-CM

## 2023-10-01 DIAGNOSIS — M47816 Spondylosis without myelopathy or radiculopathy, lumbar region: Secondary | ICD-10-CM

## 2023-10-01 DIAGNOSIS — Z7984 Long term (current) use of oral hypoglycemic drugs: Secondary | ICD-10-CM

## 2023-10-01 DIAGNOSIS — E1159 Type 2 diabetes mellitus with other circulatory complications: Secondary | ICD-10-CM

## 2023-10-01 DIAGNOSIS — I10 Essential (primary) hypertension: Secondary | ICD-10-CM

## 2023-10-01 DIAGNOSIS — J449 Chronic obstructive pulmonary disease, unspecified: Secondary | ICD-10-CM | POA: Diagnosis not present

## 2023-10-01 DIAGNOSIS — N529 Male erectile dysfunction, unspecified: Secondary | ICD-10-CM | POA: Insufficient documentation

## 2023-10-01 DIAGNOSIS — Z125 Encounter for screening for malignant neoplasm of prostate: Secondary | ICD-10-CM | POA: Insufficient documentation

## 2023-10-01 DIAGNOSIS — Z23 Encounter for immunization: Secondary | ICD-10-CM | POA: Diagnosis not present

## 2023-10-01 DIAGNOSIS — G4761 Periodic limb movement disorder: Secondary | ICD-10-CM

## 2023-10-01 DIAGNOSIS — Z1159 Encounter for screening for other viral diseases: Secondary | ICD-10-CM

## 2023-10-01 DIAGNOSIS — Z8673 Personal history of transient ischemic attack (TIA), and cerebral infarction without residual deficits: Secondary | ICD-10-CM

## 2023-10-01 DIAGNOSIS — E782 Mixed hyperlipidemia: Secondary | ICD-10-CM

## 2023-10-01 DIAGNOSIS — Z85528 Personal history of other malignant neoplasm of kidney: Secondary | ICD-10-CM

## 2023-10-01 MED ORDER — GABAPENTIN 100 MG PO CAPS
100.0000 mg | ORAL_CAPSULE | Freq: Two times a day (BID) | ORAL | 3 refills | Status: DC
Start: 2023-10-01 — End: 2024-01-01

## 2023-10-01 MED ORDER — CLOPIDOGREL BISULFATE 75 MG PO TABS
75.0000 mg | ORAL_TABLET | Freq: Every day | ORAL | 11 refills | Status: DC
Start: 2023-10-01 — End: 2024-01-01

## 2023-10-01 MED ORDER — ROPINIROLE HCL 0.5 MG PO TABS: ORAL_TABLET | ORAL | 5 refills | Status: DC

## 2023-10-01 MED ORDER — ATORVASTATIN CALCIUM 40 MG PO TABS: 40.0000 mg | ORAL_TABLET | Freq: Every day | ORAL | 1 refills | Status: DC

## 2023-10-01 MED ORDER — RAMIPRIL 10 MG PO CAPS
10.0000 mg | ORAL_CAPSULE | Freq: Every day | ORAL | 5 refills | Status: DC
Start: 2023-10-01 — End: 2024-01-01

## 2023-10-01 MED ORDER — JARDIANCE 10 MG PO TABS
10.0000 mg | ORAL_TABLET | Freq: Every day | ORAL | 3 refills | Status: DC
Start: 2023-10-01 — End: 2024-01-01

## 2023-10-01 MED ORDER — SILDENAFIL CITRATE 25 MG PO TABS: 25.0000 mg | ORAL_TABLET | Freq: Every day | ORAL | 1 refills | Status: DC | PRN

## 2023-10-01 NOTE — Progress Notes (Signed)
New Patient Office Visit  Subjective:  Patient ID: Raymond Barrera., male    DOB: 25-Apr-1961  Age: 62 y.o. MRN: [id number]  CC:  Chief Complaint  Patient presents with   Establish Care    HPI Raymond Aragonez. is a 62 y.o. male with past medical history of CVA with expressive aphasia, HTN, COPD, type II DM, CKD, RCC s/p nephrectomy, restless leg syndrome and tobacco abuse who presents for establishing care.  CVA with residual expressive aphasia: Chart review suggests history of multiple CVAs in the past, last in 02/24.  He has seen neurologist for it.  He is supposed to take Plavix and statin, but has run out of them.  He has chronic right sided weakness as well.  Due to his expressive aphasia, he feels agitated at times due to delayed speech.  HTN: BP is well-controlled. Takes ramipril 10 mg once daily. Patient denies headache, dizziness, chest pain, dyspnea or palpitations.  COPD: He uses Anoro for it.  He was recently placed on Trelegy by pulmonology clinic, but he feels choking sensation with it.  Still smokes about 2 cigarettes/day.  He has exertional dyspnea, but denies any wheezing currently.  Type II DM: His last HbA1c was 6.4 in 02/24.  He takes Jardiance 10 mg QD.  Denies any polyuria or polyphagia currently.  CKD: His last few BMP show GFR ~40.  Denies any dysuria or hematuria.  Erectile dysfunction: He reports difficulty maintaining erection.  He has sexual desire.  Denies dysuria, hematuria or urethral discharge currently.   Past Medical History:  Diagnosis Date   Ambulates with cane    straight - uses occasionally   Anxiety    due to the stroke   Arthritis    Back pain    hx of buldging disc   Complication of anesthesia    took a while for him to wake up after previous anesthesia   CVA (cerebral vascular accident) (HCC) 10/07/2020   Diabetes mellitus without complication (HCC)    Family history of adverse reaction to anesthesia    "sometimes mom has a hard  time waking up"   High cholesterol    takes Zocor daily   Hypertension    takes Benazepril and HCTZ  daily   Joint pain    Joint swelling    Memory impairment    occassional - from stroke   Myocardial infarction (HCC) 1987   Pneumonia    hx of-80's   Shortness of breath dyspnea    do to pain   Sleep apnea    never had a sleep study,but states Dr. Janna Arch says he has it   Slurred speech    Stroke (HCC) 08/2013   7 mini-strokes, last stroke 2017   TIA (transient ischemic attack) 2014   x 7    Urinary frequency     Past Surgical History:  Procedure Laterality Date   ABDOMINAL EXPOSURE N/A 06/16/2018   Procedure: ABDOMINAL EXPOSURE;  Surgeon: Larina Earthly, MD;  [facility];  Service: Vascular;  Laterality: N/A;   ANKLE SURGERY  2008   left ankle-otif-[facility]   ANTERIOR LUMBAR FUSION Bilateral 06/16/2018   Procedure: LUMBAR 4-5 LUMBAR 5-SACRUM 1 ANTERIOR LUMBAR INTERBODY FUSION WITH INSTRUMENTATION AND ALLOGRAFT;  Surgeon: Estill Bamberg, MD;  Location: [facility] OR;  Service: Orthopedics;  Laterality: Bilateral;   BACK SURGERY     HEMATOMA EVACUATION Left 08/13/2018   Procedure: EVACUATION HEMATOMA LEFT ABDOMINAL WALL;  Surgeon: Larina Earthly,  MD;  Location: [facility] OR;  Service: Vascular;  Laterality: Left;   IR RADIOLOGIST EVAL & MGMT  07/27/2018   IR US GUIDE BX ASP/DRAIN  07/14/2018   JOINT REPLACEMENT     both hips replaced    LUMBAR LAMINECTOMY/DECOMPRESSION MICRODISCECTOMY Right 10/17/2014   Procedure: LUMBAR LAMINECTOMY/DECOMPRESSION MICRODISCECTOMY 2 LEVELS;  Surgeon: Lisbeth Renshaw, MD;  Location: [facility] OR[facility];  Service: Neurosurgery;  Laterality: Right;  Right L45 L5S1 laminectomy and foraminotomy   MASS EXCISION  09/13/2012   Procedure: EXCISION MASS;  Surgeon: Dalia Heading, MD;  Location: [facility] ORS;  Service: General;  Laterality: N/A;   ROBOTIC ASSITED PARTIAL NEPHRECTOMY Left 03/30/2019   Procedure: XI ROBOTIC ASSITED PARTIAL NEPHRECTOMY POSSIBLE RADICAL NEPHRECTOMY;   Surgeon: Rene Paci, MD;  Location: [facility] ORS;  Service: Urology;  Laterality: Left;   SHOULDER ARTHROSCOPY WITH ROTATOR CUFF REPAIR Right 04/12/2020   Procedure: right shoulder arthroscopy, debridement, mini open rotator cuff tear repair;  Surgeon: Cammy Copa, MD;  Location: [facility] OR;  Service: Orthopedics;  Laterality: Right;   TONSILLECTOMY     TOTAL HIP ARTHROPLASTY Right 03/20/2015   TOTAL HIP ARTHROPLASTY Right 03/20/2015   Procedure: TOTAL HIP ARTHROPLASTY ANTERIOR APPROACH;  Surgeon: Sheral Apley, MD;  Location: [facility] OR;  Service: Orthopedics;  Laterality: Right;   TOTAL HIP ARTHROPLASTY Left 01/08/2016   Procedure: TOTAL HIP ARTHROPLASTY ANTERIOR APPROACH;  Surgeon: Sheral Apley, MD;  Location: [facility] OR;  Service: Orthopedics;  Laterality: Left;    Family History  Problem Relation Age of Onset   Heart disease Father     Social History   Socioeconomic History   Marital status: Married    Spouse name: Not on file   Number of children: Not on file   Years of education: Not on file   Highest education level: Not on file  Occupational History   Not on file  Tobacco Use   Smoking status: Some Days    Current packs/day: 0.25    Average packs/day: 0.3 packs/day for 47.0 years (11.8 ttl pk-yrs)    Types: Cigarettes   Smokeless tobacco: Never   Tobacco comments:    Currently smokes about 2 cigarettes per day.  07/14/2023 hfb  Vaping Use   Vaping status: Never Used  Substance and Sexual Activity   Alcohol use: Not Currently    Comment: quit 2012   Drug use: Not Currently    Types: Cocaine    Comment: many yrs ago., last time- late 2016   Sexual activity: Yes  Other Topics Concern   Not on file  Social History Narrative   Are you right handed or left handed? Right   Are you currently employed ? disabled   What is your current occupation?   Do you live at home alone? NO   Who lives with you? Mother, Wife   What type of home do you live in: 1 story or 2  story? 1       Social Determinants of Health   Financial Resource Strain: Not on file  Food Insecurity: No Food Insecurity (01/13/2023)   Hunger Vital Sign    Worried About Running Out of Food in the Last Year: Never true    Ran Out of Food in the Last Year: Never true  Transportation Needs: No Transportation Needs (01/13/2023)   PRAPARE - Administrator, Civil Service (Medical): No    Lack of Transportation (Non-Medical): No  Physical Activity: Not on file  Stress:  Not on file  Social Connections: Not on file  Intimate Partner Violence: Not At Risk (01/13/2023)   Humiliation, Afraid, Rape, and Kick questionnaire    Fear of Current or Ex-Partner: No    Emotionally Abused: No    Physically Abused: No    Sexually Abused: No    ROS Review of Systems  Constitutional:  Negative for chills and fever.  HENT:  Negative for congestion and sore throat.   Eyes:  Negative for pain and discharge.  Respiratory:  Positive for shortness of breath (Intermittent). Negative for cough.   Cardiovascular:  Negative for chest pain and palpitations.  Gastrointestinal:  Negative for diarrhea, nausea and vomiting.  Endocrine: Negative for polydipsia and polyuria.  Genitourinary:  Negative for dysuria and hematuria.  Musculoskeletal:  Positive for back pain and gait problem. Negative for neck pain and neck stiffness.  Skin:  Negative for rash.  Neurological:  Positive for speech difficulty and weakness (RUE and RLE). Negative for dizziness.  Psychiatric/Behavioral:  Negative for agitation and behavioral problems. The patient is nervous/anxious.     Objective:   Today's Vitals: BP 135/80 (BP Location: Left Arm, Patient Position: Sitting, Cuff Size: Large)   Pulse 83   Ht 6\' 6"  (1.981 m)   Wt 245 lb 9.6 oz (111.4 kg)   SpO2 95%   BMI 28.38 kg/m   Physical Exam Vitals reviewed.  Constitutional:      General: He is not in acute distress.    Appearance: He is not diaphoretic.  HENT:      Head: Normocephalic and atraumatic.     Nose: Nose normal.     Mouth/Throat:     Mouth: Mucous membranes are moist.  Eyes:     General: No scleral icterus.    Extraocular Movements: Extraocular movements intact.  Cardiovascular:     Rate and Rhythm: Normal rate and regular rhythm.     Heart sounds: Normal heart sounds. No murmur heard. Pulmonary:     Breath sounds: Normal breath sounds. No wheezing or rales.  Abdominal:     Palpations: Abdomen is soft.     Tenderness: There is no abdominal tenderness.  Musculoskeletal:     Cervical back: Neck supple. No tenderness.     Right lower leg: No edema.     Left lower leg: No edema.  Skin:    General: Skin is warm.     Findings: No rash.  Neurological:     General: No focal deficit present.     Mental Status: He is alert and oriented to person, place, and time.     Motor: Weakness (RUE and RLE - 4/5) present.     Comments: Expressive aphasia  Psychiatric:        Mood and Affect: Mood normal.        Behavior: Behavior normal.     Assessment & Plan:   Problem List Items Addressed This Visit       Cardiovascular and Mediastinum   Essential hypertension    BP Readings from Last 1 Encounters:  10/01/23 135/80   Well-controlled with ramipril 10 mg QD Counseled for compliance with the medications Advised DASH diet and moderate exercise/walking as tolerated       Relevant Medications   sildenafil (VIAGRA) 25 MG tablet   ramipril (ALTACE) 10 MG capsule   atorvastatin (LIPITOR) 40 MG tablet   Other Relevant Orders   CMP14+EGFR   CBC with Differential/Platelet     Respiratory   COPD (chronic obstructive  pulmonary disease) (HCC)    Well controlled with Anoro, followed by pulmonology Did not like the effect of Trelegy        Endocrine   Diabetes mellitus (HCC)    Lab Results  Component Value Date   HGBA1C 6.4 (H) 01/13/2023   Well controlled On Jardiance 10 mg QD Advised to follow diabetic diet Needs to be on  statin, on ACEi -restart atorvastatin F/u CMP, HbA1c and lipid panel Diabetic eye exam: Advised to follow up with Ophthalmology for diabetic eye exam       Relevant Medications   JARDIANCE 10 MG TABS tablet   ramipril (ALTACE) 10 MG capsule   atorvastatin (LIPITOR) 40 MG tablet   Other Relevant Orders   Hemoglobin A1c   CMP14+EGFR   Urine Microalbumin w/creat. ratio     Musculoskeletal and Integument   Lumbar spondylosis    Has chronic low back pain with radicular symptoms in LE Restart gabapentin 100 mg twice daily Avoid heavy lifting and frequent bending      Relevant Medications   gabapentin (NEURONTIN) 100 MG capsule     Genitourinary   CKD (chronic kidney disease), stage III (HCC)    Chart review suggests GFR ~ 40 Needs to maintain adequate hydration On ACEi and Jardiance currently Check urine microalbumin/creatinine ratio      Relevant Orders   VITAMIN D 25 Hydroxy (Vit-D Deficiency, Fractures)   CMP14+EGFR   CBC with Differential/Platelet     Other   Hyperlipidemia    Restart statin Check lipid profile      Relevant Medications   sildenafil (VIAGRA) 25 MG tablet   ramipril (ALTACE) 10 MG capsule   atorvastatin (LIPITOR) 40 MG tablet   Other Relevant Orders   Lipid panel   History of renal cell carcinoma    S/p nephrectomy      History of CVA (cerebrovascular accident)    Needs to continue Plavix and restart statin Referred to speech therapy for expressive aphasia      Relevant Medications   clopidogrel (PLAVIX) 75 MG tablet   Periodic limb movement    Well controlled with Requip      Relevant Medications   rOPINIRole (REQUIP) 0.5 MG tablet   Aphasia as late effect of cerebrovascular accident (CVA) - Primary    Needs to continue Plavix and statin-refilled Referred to speech therapy as he was improving with it      Relevant Orders   Ambulatory referral to Speech Therapy   TSH   CBC with Differential/Platelet   Erectile dysfunction     Could be due to chronic medical conditions Started sildenafil as needed      Relevant Medications   sildenafil (VIAGRA) 25 MG tablet   Prostate cancer screening    Ordered PSA after discussing its limitations for prostate cancer screening, including false positive results leading to additional investigations.      Relevant Orders   PSA   Other Visit Diagnoses     Encounter for immunization       Relevant Orders   Flu vaccine trivalent PF, 6mos and older(Flulaval,Afluria,Fluarix,Fluzone) (Completed)   Need for hepatitis C screening test       Relevant Orders   Hepatitis C Antibody       Outpatient Encounter Medications as of 10/01/2023  Medication Sig   Accu-Chek Softclix Lancets lancets SMARTSIG:Topical 2-3 Times Daily   atorvastatin (LIPITOR) 40 MG tablet Take 1 tablet (40 mg total) by mouth daily.   Fluticasone-Umeclidin-Vilant (TRELEGY ELLIPTA)  100-62.5-25 MCG/ACT AEPB Inhale 1 puff into the lungs daily.   ondansetron (ZOFRAN) 4 MG tablet Take 1 tablet (4 mg total) by mouth every 8 (eight) hours as needed for nausea or vomiting.   sildenafil (VIAGRA) 25 MG tablet Take 1 tablet (25 mg total) by mouth daily as needed for erectile dysfunction.   umeclidinium-vilanterol (ANORO ELLIPTA) 62.5-25 MCG/ACT AEPB Inhale 1 puff into the lungs daily.   [DISCONTINUED] clopidogrel (PLAVIX) 75 MG tablet Take 1 tablet (75 mg total) by mouth daily.   [DISCONTINUED] gabapentin (NEURONTIN) 100 MG capsule Take 1 capsule (100 mg total) by mouth 2 (two) times daily.   [DISCONTINUED] glucose 4 GM chewable tablet Chew 1 tablet by mouth as needed for low blood sugar.   [DISCONTINUED] JARDIANCE 10 MG TABS tablet Take 1 tablet (10 mg total) by mouth daily.   [DISCONTINUED] ramipril (ALTACE) 10 MG capsule Take 10 mg by mouth daily.   [DISCONTINUED] rOPINIRole (REQUIP) 0.5 MG tablet Take 1 tablet at bedtime   clopidogrel (PLAVIX) 75 MG tablet Take 1 tablet (75 mg total) by mouth daily.   gabapentin  (NEURONTIN) 100 MG capsule Take 1 capsule (100 mg total) by mouth 2 (two) times daily.   JARDIANCE 10 MG TABS tablet Take 1 tablet (10 mg total) by mouth daily.   lidocaine (LIDODERM) 5 % Place 1 patch onto the skin daily. Remove & Discard patch within 12 hours or as directed by MD (Patient not taking: Reported on 10/01/2023)   ramipril (ALTACE) 10 MG capsule Take 1 capsule (10 mg total) by mouth daily.   rOPINIRole (REQUIP) 0.5 MG tablet Take 1 tablet at bedtime   No facility-administered encounter medications on file as of 10/01/2023.    Follow-up: Return in about 3 months (around 01/01/2024) for DM and CKD.   Anabel Halon, MD

## 2023-10-01 NOTE — Assessment & Plan Note (Signed)
Well controlled with Requip

## 2023-10-01 NOTE — Assessment & Plan Note (Signed)
S/p nephrectomy

## 2023-10-01 NOTE — Assessment & Plan Note (Signed)
Restart statin Check lipid profile

## 2023-10-01 NOTE — Assessment & Plan Note (Signed)
Needs to continue Plavix and restart statin Referred to speech therapy for expressive aphasia

## 2023-10-01 NOTE — Patient Instructions (Signed)
Please start taking medications as prescribed.  Please follow low carb diet and ambulate as tolerated.  Please get eye exam done after checking with your insurance.

## 2023-10-01 NOTE — Assessment & Plan Note (Signed)
Well controlled with Anoro, followed by pulmonology Did not like the effect of Trelegy

## 2023-10-01 NOTE — Assessment & Plan Note (Signed)
Lab Results  Component Value Date   HGBA1C 6.4 (H) 01/13/2023   Well controlled On Jardiance 10 mg QD Advised to follow diabetic diet Needs to be on statin, on ACEi -restart atorvastatin F/u CMP, HbA1c and lipid panel Diabetic eye exam: Advised to follow up with Ophthalmology for diabetic eye exam

## 2023-10-01 NOTE — Assessment & Plan Note (Signed)
Ordered PSA after discussing its limitations for prostate cancer screening, including false positive results leading to additional investigations. 

## 2023-10-01 NOTE — Assessment & Plan Note (Signed)
Chart review suggests GFR ~ 40 Needs to maintain adequate hydration On ACEi and Jardiance currently Check urine microalbumin/creatinine ratio

## 2023-10-01 NOTE — Assessment & Plan Note (Signed)
Has chronic low back pain with radicular symptoms in LE Restart gabapentin 100 mg twice daily Avoid heavy lifting and frequent bending

## 2023-10-01 NOTE — Assessment & Plan Note (Signed)
BP Readings from Last 1 Encounters:  10/01/23 135/80   Well-controlled with ramipril 10 mg QD Counseled for compliance with the medications Advised DASH diet and moderate exercise/walking as tolerated

## 2023-10-01 NOTE — Assessment & Plan Note (Signed)
Needs to continue Plavix and statin-refilled Referred to speech therapy as he was improving with it

## 2023-10-01 NOTE — Assessment & Plan Note (Signed)
Could be due to chronic medical conditions Started sildenafil as needed

## 2023-10-02 ENCOUNTER — Other Ambulatory Visit: Payer: Self-pay | Admitting: Internal Medicine

## 2023-10-02 DIAGNOSIS — R972 Elevated prostate specific antigen [PSA]: Secondary | ICD-10-CM | POA: Insufficient documentation

## 2023-10-02 DIAGNOSIS — Z1211 Encounter for screening for malignant neoplasm of colon: Secondary | ICD-10-CM | POA: Insufficient documentation

## 2023-10-02 DIAGNOSIS — E1159 Type 2 diabetes mellitus with other circulatory complications: Secondary | ICD-10-CM

## 2023-10-02 DIAGNOSIS — D500 Iron deficiency anemia secondary to blood loss (chronic): Secondary | ICD-10-CM

## 2023-10-02 DIAGNOSIS — N1832 Chronic kidney disease, stage 3b: Secondary | ICD-10-CM

## 2023-10-02 NOTE — Addendum Note (Signed)
Addended byTrena Platt on: 10/02/2023 08:10 AM   Modules accepted: Orders

## 2023-10-03 LAB — MICROALBUMIN / CREATININE URINE RATIO
Creatinine, Urine: 167.9 mg/dL
Microalb/Creat Ratio: 355 mg/g{creat} — ABNORMAL HIGH (ref 0–29)
Microalbumin, Urine: 595.8 ug/mL

## 2023-10-03 LAB — CBC WITH DIFFERENTIAL/PLATELET
Basophils Absolute: 0 10*3/uL (ref 0.0–0.2)
Basos: 0 %
EOS (ABSOLUTE): 0.1 10*3/uL (ref 0.0–0.4)
Eos: 2 %
Hematocrit: 28 % — ABNORMAL LOW (ref 37.5–51.0)
Hemoglobin: 7.9 g/dL — ABNORMAL LOW (ref 13.0–17.7)
Immature Grans (Abs): 0 10*3/uL (ref 0.0–0.1)
Immature Granulocytes: 0 %
Lymphocytes Absolute: 1.3 10*3/uL (ref 0.7–3.1)
Lymphs: 19 %
MCH: 21 pg — ABNORMAL LOW (ref 26.6–33.0)
MCHC: 28.2 g/dL — ABNORMAL LOW (ref 31.5–35.7)
MCV: 75 fL — ABNORMAL LOW (ref 79–97)
Monocytes Absolute: 0.5 10*3/uL (ref 0.1–0.9)
Monocytes: 8 %
Neutrophils Absolute: 4.8 10*3/uL (ref 1.4–7.0)
Neutrophils: 71 %
Platelets: 360 10*3/uL (ref 150–450)
RBC: 3.76 x10E6/uL — ABNORMAL LOW (ref 4.14–5.80)
RDW: 17.3 % — ABNORMAL HIGH (ref 11.6–15.4)
WBC: 6.8 10*3/uL (ref 3.4–10.8)

## 2023-10-03 LAB — PSA: Prostate Specific Ag, Serum: 8 ng/mL — ABNORMAL HIGH (ref 0.0–4.0)

## 2023-10-03 LAB — VITAMIN D 25 HYDROXY (VIT D DEFICIENCY, FRACTURES): Vit D, 25-Hydroxy: 23.5 ng/mL — ABNORMAL LOW (ref 30.0–100.0)

## 2023-10-03 LAB — CMP14+EGFR
ALT: 16 IU/L (ref 0–44)
AST: 15 IU/L (ref 0–40)
Albumin: 4.2 g/dL (ref 3.9–4.9)
Alkaline Phosphatase: 118 [IU]/L (ref 44–121)
BUN/Creatinine Ratio: 18 (ref 10–24)
BUN: 34 mg/dL — ABNORMAL HIGH (ref 8–27)
Bilirubin Total: <0.2 mg/dL (ref 0.0–1.2)
CO2: 21 mmol/L (ref 20–29)
Calcium: 11.2 mg/dL — ABNORMAL HIGH (ref 8.6–10.2)
Chloride: 106 mmol/L (ref 96–106)
Creatinine, Ser: 1.91 mg/dL — ABNORMAL HIGH (ref 0.76–1.27)
Globulin, Total: 2.4 g/dL (ref 1.5–4.5)
Glucose: 169 mg/dL — ABNORMAL HIGH (ref 70–99)
Potassium: 5.1 mmol/L (ref 3.5–5.2)
Sodium: 141 mmol/L (ref 134–144)
Total Protein: 6.6 g/dL (ref 6.0–8.5)
eGFR: 39 mL/min/{1.73_m2} — ABNORMAL LOW (ref >59)

## 2023-10-03 LAB — LIPID PANEL
Chol/HDL Ratio: 4.9 ratio (ref 0.0–5.0)
Cholesterol, Total: 190 mg/dL (ref 100–199)
HDL: 39 mg/dL — ABNORMAL LOW (ref >39)
LDL Chol Calc (NIH): 114 mg/dL — ABNORMAL HIGH (ref 0–99)
Triglycerides: 211 mg/dL — ABNORMAL HIGH (ref 0–149)
VLDL Cholesterol Cal: 37 mg/dL (ref 5–40)

## 2023-10-03 LAB — HEMOGLOBIN A1C
Est. average glucose Bld gHb Est-mCnc: 148 mg/dL
Hgb A1c MFr Bld: 6.8 % — ABNORMAL HIGH (ref 4.8–5.6)

## 2023-10-03 LAB — TSH: TSH: 1.25 u[IU]/mL (ref 0.450–4.500)

## 2023-10-03 LAB — HEPATITIS C ANTIBODY: Hep C Virus Ab: NONREACTIVE

## 2023-10-05 ENCOUNTER — Encounter (INDEPENDENT_AMBULATORY_CARE_PROVIDER_SITE_OTHER): Payer: Self-pay | Admitting: *Deleted

## 2023-10-12 ENCOUNTER — Ambulatory Visit (INDEPENDENT_AMBULATORY_CARE_PROVIDER_SITE_OTHER): Payer: MEDICAID | Admitting: Gastroenterology

## 2023-10-12 ENCOUNTER — Encounter (INDEPENDENT_AMBULATORY_CARE_PROVIDER_SITE_OTHER): Payer: Self-pay | Admitting: Gastroenterology

## 2023-10-12 VITALS — BP 126/79 | HR 84 | Temp 97.9°F | Ht 78.0 in | Wt 243.7 lb

## 2023-10-12 DIAGNOSIS — Z6828 Body mass index (BMI) 28.0-28.9, adult: Secondary | ICD-10-CM | POA: Diagnosis not present

## 2023-10-12 DIAGNOSIS — D509 Iron deficiency anemia, unspecified: Secondary | ICD-10-CM | POA: Diagnosis not present

## 2023-10-12 DIAGNOSIS — D500 Iron deficiency anemia secondary to blood loss (chronic): Secondary | ICD-10-CM | POA: Insufficient documentation

## 2023-10-12 DIAGNOSIS — R110 Nausea: Secondary | ICD-10-CM | POA: Insufficient documentation

## 2023-10-12 MED ORDER — PEG 3350-KCL-NA BICARB-NACL 420 G PO SOLR: 4000.0000 mL | Freq: Once | ORAL | 0 refills | Status: AC

## 2023-10-12 NOTE — Progress Notes (Signed)
Vista Lawman , M.D. Gastroenterology & Hepatology [facility] Gastroenterology [address] [place], [place] [address] Primary Care Physician: Anabel Halon, MD [address] [place] [address]  Chief Complaint:  Microcytic Anemia concerning for Iron deficiency anemia   History of Present Illness: Raymond Barrera. is a 62 y.o. 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  Patient is accompanied by wife here, as he has expressive aphasia but is able to answer questions by engaging in discussion with wife help.  Patient never had any endoscopy evaluation.  He is hearing for the first time that he is significantly anemic. The patient denies having any nausea, vomiting, fever, chills, hematochezia, melena, hematemesis, abdominal distention, abdominal pain, diarrhea, jaundice, pruritus or weight loss.  Patient does report intermittent nausea with certain patient takes.  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  Last ION:GEXB Last Colonoscopy:none  Labs from 10/01/2023 normal liver enzymes triglyceride 311 vitamin D 23 hemoglobin 7.9 MCV 75 hemoglobin A1c 6.8 TSH 1.2  FHx: neg for any gastrointestinal/liver disease, no malignancies Surgical: Nephrectomy  Past Medical History: Past Medical History:  Diagnosis Date   Ambulates with cane    straight - uses occasionally   Anxiety    due to the stroke   Arthritis    Back pain    hx of buldging disc   Complication of anesthesia    took a while for him to wake up after previous anesthesia   CVA (cerebral vascular accident) (HCC) 10/07/2020   Diabetes mellitus without complication (HCC)    Family history of adverse reaction to anesthesia    "sometimes mom has a hard time waking up"    High cholesterol    takes Zocor daily   Hypertension    takes Benazepril and HCTZ  daily   Joint pain    Joint swelling    Memory impairment    occassional - from stroke   Myocardial infarction (HCC) 1987   Pneumonia    hx of-80's   Shortness of breath dyspnea    do to pain   Sleep apnea    never had a sleep study,but states Dr. Janna Arch says he has it   Slurred speech    Stroke (HCC) 08/2013   7 mini-strokes, last stroke 2017   TIA (transient ischemic attack) 2014   x 7    Urinary frequency     Past Surgical History: Past Surgical History:  Procedure Laterality Date   ABDOMINAL EXPOSURE N/A 06/16/2018   Procedure: ABDOMINAL EXPOSURE;  Surgeon: Larina Earthly, MD;  [facility];  Service: Vascular;  Laterality: N/A;   ANKLE SURGERY  2008   left ankle-otif-[facility]   ANTERIOR LUMBAR FUSION Bilateral 06/16/2018   Procedure: LUMBAR 4-5 LUMBAR 5-SACRUM 1 ANTERIOR LUMBAR INTERBODY FUSION WITH INSTRUMENTATION AND ALLOGRAFT;  Surgeon: Estill Bamberg, MD;  Location: [facility] OR;  Service: Orthopedics;  Laterality: Bilateral;   BACK SURGERY     HEMATOMA EVACUATION Left 08/13/2018   Procedure: EVACUATION HEMATOMA LEFT ABDOMINAL WALL;  Surgeon: Larina Earthly, MD;  Location: [facility] OR;  Service: Vascular;  Laterality: Left;   IR RADIOLOGIST EVAL & MGMT  07/27/2018   IR US GUIDE BX ASP/DRAIN  07/14/2018   JOINT REPLACEMENT     both hips  replaced    LUMBAR LAMINECTOMY/DECOMPRESSION MICRODISCECTOMY Right 10/17/2014   Procedure: LUMBAR LAMINECTOMY/DECOMPRESSION MICRODISCECTOMY 2 LEVELS;  Surgeon: Lisbeth Renshaw, MD;  [facility]: [facility];  Service: Neurosurgery;  Laterality: Right;  Right L45 L5S1 laminectomy and foraminotomy   MASS EXCISION  09/13/2012   Procedure: EXCISION MASS;  Surgeon: Dalia Heading, MD;  Location: [facility] ORS;  Service: General;  Laterality: N/A;   ROBOTIC ASSITED PARTIAL NEPHRECTOMY Left 03/30/2019   Procedure: XI ROBOTIC ASSITED PARTIAL NEPHRECTOMY POSSIBLE RADICAL NEPHRECTOMY;   Surgeon: Rene Paci, MD;  Location: [facility] ORS;  Service: Urology;  Laterality: Left;   SHOULDER ARTHROSCOPY WITH ROTATOR CUFF REPAIR Right 04/12/2020   Procedure: right shoulder arthroscopy, debridement, mini open rotator cuff tear repair;  Surgeon: Cammy Copa, MD;  Location: [facility] OR;  Service: Orthopedics;  Laterality: Right;   TONSILLECTOMY     TOTAL HIP ARTHROPLASTY Right 03/20/2015   TOTAL HIP ARTHROPLASTY Right 03/20/2015   Procedure: TOTAL HIP ARTHROPLASTY ANTERIOR APPROACH;  Surgeon: Sheral Apley, MD;  [facility];  Service: Orthopedics;  Laterality: Right;   TOTAL HIP ARTHROPLASTY Left 01/08/2016   Procedure: TOTAL HIP ARTHROPLASTY ANTERIOR APPROACH;  Surgeon: Sheral Apley, MD;  [facility];  Service: Orthopedics;  Laterality: Left;    Family History: Family History  Problem Relation Age of Onset   Heart disease Father     Social History: Social History   Tobacco Use  Smoking Status Some Days   Current packs/day: 0.25   Average packs/day: 0.3 packs/day for 47.0 years (11.8 ttl pk-yrs)   Types: Cigarettes  Smokeless Tobacco Never  Tobacco Comments   Currently smokes about 2 cigarettes per day.  07/14/2023 hfb   Social History   Substance and Sexual Activity  Alcohol Use Not Currently   Comment: quit 2012   Social History   Substance and Sexual Activity  Drug Use Not Currently   Types: Cocaine   Comment: many yrs ago., last time- late 2016    Allergies: Allergies  Allergen Reactions   Poison Ivy Extract [Poison Ivy Extract] Itching and Rash   Coreg [Carvedilol] Itching    Itching and dizziness    Medications: Current Outpatient Medications  Medication Sig Dispense Refill   Accu-Chek Softclix Lancets lancets SMARTSIG:Topical 2-3 Times Daily     clopidogrel (PLAVIX) 75 MG tablet Take 1 tablet (75 mg total) by mouth daily. 30 tablet 11   gabapentin (NEURONTIN) 100 MG capsule Take 1 capsule (100 mg total) by mouth 2 (two) times  daily. 60 capsule 3   JARDIANCE 10 MG TABS tablet Take 1 tablet (10 mg total) by mouth daily. 30 tablet 3   lidocaine (LIDODERM) 5 % Place 1 patch onto the skin daily. Remove & Discard patch within 12 hours or as directed by MD 30 patch 0   ondansetron (ZOFRAN) 4 MG tablet Take 1 tablet (4 mg total) by mouth every 8 (eight) hours as needed for nausea or vomiting. 20 tablet 0   ramipril (ALTACE) 10 MG capsule Take 1 capsule (10 mg total) by mouth daily. 30 capsule 5   rOPINIRole (REQUIP) 0.5 MG tablet Take 1 tablet at bedtime 30 tablet 5   atorvastatin (LIPITOR) 40 MG tablet Take 1 tablet (40 mg total) by mouth daily. (Patient not taking: Reported on 10/12/2023) 90 tablet 1   Fluticasone-Umeclidin-Vilant (TRELEGY ELLIPTA) 100-62.5-25 MCG/ACT AEPB Inhale 1 puff into the lungs daily. (Patient not taking: Reported on 10/12/2023)     sildenafil (VIAGRA) 25 MG tablet  Take 1 tablet (25 mg total) by mouth daily as needed for erectile dysfunction. 10 tablet 1   umeclidinium-vilanterol (ANORO ELLIPTA) 62.5-25 MCG/ACT AEPB Inhale 1 puff into the lungs daily. 60 each 3   No current facility-administered medications for this visit.    Review of Systems: GENERAL: negative for malaise, night sweats HEENT: No changes in hearing or vision, no nose bleeds or other nasal problems. NECK: Negative for lumps, goiter, pain and significant neck swelling RESPIRATORY: Negative for cough, wheezing CARDIOVASCULAR: Negative for chest pain, leg swelling, palpitations, orthopnea GI: SEE HPI MUSCULOSKELETAL: Negative for joint pain or swelling, back pain, and muscle pain. SKIN: Negative for lesions, rash HEMATOLOGY Negative for prolonged bleeding, bruising easily, and swollen nodes. ENDOCRINE: Negative for cold or heat intolerance, polyuria, polydipsia and goiter. NEURO: negative for tremor, gait imbalance, syncope and seizures. The remainder of the review of systems is noncontributory.   Physical Exam: There were no  vitals taken for this visit. GENERAL: The patient is AO x3, in no acute distress. HEENT: Head is normocephalic and atraumatic. EOMI are intact. Mouth is well hydrated and without lesions. NECK: Supple. No masses LUNGS: Clear to auscultation. No presence of rhonchi/wheezing/rales. Adequate chest expansion HEART: RRR, normal s1 and s2. ABDOMEN: Soft, nontender, no guarding, no peritoneal signs, and nondistended. BS +. No masses.   Imaging/Labs: as above     Latest Ref Rng & Units 10/01/2023    9:55 AM 02/04/2023    2:00 PM 01/13/2023   12:31 PM  CBC  WBC 3.4 - 10.8 x10E3/uL 6.8  7.1    Hemoglobin 13.0 - 17.7 g/dL 7.9  29.5  28.4   Hematocrit 37.5 - 51.0 % 28.0  33.5  36.0   Platelets 150 - 450 x10E3/uL 360  282     Lab Results  Component Value Date   IRON 60 04/19/2019   TIBC 307 04/19/2019   FERRITIN 169 04/19/2019    I personally reviewed and interpreted the available labs, imaging and endoscopic files.  Impression and Plan:  Jahrel Lacomb. is a 62 y.o. 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  #Microcytic Anemia concerning for Iron deficiency anemia   Patient has worsening anemia especially this year, drop of hemoglobin baseline 12---->7.9 now MCV 75 .   Will obtain iron profile, vitamin B12 and folate to differentiate type of anemia  No overt GI bleed but given severity of anemia which is worsening and no previous endoscopy or colonoscopy we will plan to pursue upper endoscopy and colonoscopy to evaluate for microcytic anemia.  Will plan on doing procedure on Plavix given patient is high risk for strokes and TIA.  If any large polyp is encountered procedure may need to be repeated of Plavix but diminutive polyps and biopsies may be taken  If blood work and endoscopy evaluation is negative  patient will need to be referred to hematology and nephrology at that time  #BMI : 28      - walking at a brisk pace/biking at moderate intesity 2.5-5 hours per week     - use pedometer/step counter to track activity     - goal to lose 5-10% of initial body weight     - avoid suagry drinks and juices, use zero calorie beverages     - increase water intake     -  eat a low carb diet with plenty of veggies and fruit     - Get sufficient sleep 7-8 hrs nightly     - maitain active lifestyle     - avoid alcohol     - recommend 2-3 cups Coffee daily     - Counsel on lowering cholesterol by having a diet rich in vegetables,          protein (avoid red meats) and good fats(fish, salmon).      Orders Placed This Encounter  Procedures   Vitamin B12   Folate   Iron, TIBC and Ferritin Panel    All questions were answered.      Vista Lawman, MD Gastroenterology and Hepatology [facility] Gastroenterology   This chart has been completed using [facility] Dictation software, and while attempts have been made to ensure accuracy , certain words and phrases may not be transcribed as intended

## 2023-10-12 NOTE — Patient Instructions (Addendum)
It was very nice to meet you today, as dicussed with will plan for the following : 1) Upper endoscopy and Colonoscopy 2) Labs

## 2023-10-13 ENCOUNTER — Telehealth: Payer: Self-pay | Admitting: Primary Care

## 2023-10-13 ENCOUNTER — Telehealth: Payer: Self-pay

## 2023-10-13 ENCOUNTER — Encounter (HOSPITAL_COMMUNITY): Payer: Self-pay | Admitting: Speech Pathology

## 2023-10-13 ENCOUNTER — Encounter (INDEPENDENT_AMBULATORY_CARE_PROVIDER_SITE_OTHER): Payer: Self-pay

## 2023-10-13 ENCOUNTER — Ambulatory Visit (HOSPITAL_COMMUNITY): Payer: MEDICAID | Attending: Internal Medicine | Admitting: Speech Pathology

## 2023-10-13 DIAGNOSIS — I69320 Aphasia following cerebral infarction: Secondary | ICD-10-CM | POA: Insufficient documentation

## 2023-10-13 DIAGNOSIS — R41841 Cognitive communication deficit: Secondary | ICD-10-CM | POA: Insufficient documentation

## 2023-10-13 DIAGNOSIS — R471 Dysarthria and anarthria: Secondary | ICD-10-CM | POA: Insufficient documentation

## 2023-10-13 NOTE — Therapy (Signed)
OUTPATIENT SPEECH LANGUAGE PATHOLOGY EVALUATION   Patient Name: Raymond Barrera. MRN: [id number] DOB:08-09-1961, 62 y.o., male 46 Date: 10/13/2023  PCP: Anabel Halon, MD REFERRING PROVIDER: Anabel Halon, MD  END OF SESSION:  End of Session - 10/13/23 1833     Visit Number 1    Number of Visits 5    Date for SLP Re-Evaluation 12/01/23    Authorization Type [employer]    SLP Start Time 1600    SLP Stop Time  1645    SLP Time Calculation (min) 45 min    Activity Tolerance Patient tolerated treatment well             Past Medical History:  Diagnosis Date   Ambulates with cane    straight - uses occasionally   Anxiety    due to the stroke   Arthritis    Back pain    hx of buldging disc   Complication of anesthesia    took a while for him to wake up after previous anesthesia   CVA (cerebral vascular accident) (HCC) 10/07/2020   Diabetes mellitus without complication (HCC)    Family history of adverse reaction to anesthesia    "sometimes mom has a hard time waking up"   High cholesterol    takes Zocor daily   Hypertension    takes Benazepril and HCTZ  daily   Joint pain    Joint swelling    Memory impairment    occassional - from stroke   Myocardial infarction (HCC) 1987   Pneumonia    hx of-80's   Shortness of breath dyspnea    do to pain   Sleep apnea    never had a sleep study,but states Dr. Janna Arch says he has it   Slurred speech    Stroke (HCC) 08/2013   7 mini-strokes, last stroke 2017   TIA (transient ischemic attack) 2014   x 7    Urinary frequency    Past Surgical History:  Procedure Laterality Date   ABDOMINAL EXPOSURE N/A 06/16/2018   Procedure: ABDOMINAL EXPOSURE;  Surgeon: Larina Earthly, MD;  Location: [facility] OR;  Service: Vascular;  Laterality: N/A;   ANKLE SURGERY  2008   left ankle-otif-[facility]   ANTERIOR LUMBAR FUSION Bilateral 06/16/2018   Procedure: LUMBAR 4-5 LUMBAR 5-SACRUM 1 ANTERIOR LUMBAR INTERBODY FUSION WITH  INSTRUMENTATION AND ALLOGRAFT;  Surgeon: Estill Bamberg, MD;  [facility] OR;  Service: Orthopedics;  Laterality: Bilateral;   BACK SURGERY     HEMATOMA EVACUATION Left 08/13/2018   Procedure: EVACUATION HEMATOMA LEFT ABDOMINAL WALL;  Surgeon: Larina Earthly, MD;  Location: [facility] OR;  Service: Vascular;  Laterality: Left;   IR RADIOLOGIST EVAL & MGMT  07/27/2018   IR US GUIDE BX ASP/DRAIN  07/14/2018   JOINT REPLACEMENT     both hips replaced    LUMBAR LAMINECTOMY/DECOMPRESSION MICRODISCECTOMY Right 10/17/2014   Procedure: LUMBAR LAMINECTOMY/DECOMPRESSION MICRODISCECTOMY 2 LEVELS;  Surgeon: Lisbeth Renshaw, MD;  [facility];  Service: Neurosurgery;  Laterality: Right;  Right L45 L5S1 laminectomy and foraminotomy   MASS EXCISION  09/13/2012   Procedure: EXCISION MASS;  Surgeon: Dalia Heading, MD;  Location: [facility]S;  Service: General;  Laterality: N/A;   ROBOTIC ASSITED PARTIAL NEPHRECTOMY Left 03/30/2019   Procedure: XI ROBOTIC ASSITED PARTIAL NEPHRECTOMY POSSIBLE RADICAL NEPHRECTOMY;  Surgeon: Rene Paci, MD;  Location: [facility]S;  Service: Urology;  Laterality: Left;   SHOULDER ARTHROSCOPY WITH ROTATOR CUFF REPAIR Right 04/12/2020  Procedure: right shoulder arthroscopy, debridement, mini open rotator cuff tear repair;  Surgeon: Cammy Copa, MD;  Location: [facility] OR;  Service: Orthopedics;  Laterality: Right;   TONSILLECTOMY     TOTAL HIP ARTHROPLASTY Right 03/20/2015   TOTAL HIP ARTHROPLASTY Right 03/20/2015   Procedure: TOTAL HIP ARTHROPLASTY ANTERIOR APPROACH;  Surgeon: Sheral Apley, MD;  [facility];  Service: Orthopedics;  Laterality: Right;   TOTAL HIP ARTHROPLASTY Left 01/08/2016   Procedure: TOTAL HIP ARTHROPLASTY ANTERIOR APPROACH;  Surgeon: Sheral Apley, MD;  Location: [facility];  Service: Orthopedics;  Laterality: Left;   Patient Active Problem List   Diagnosis Date Noted   Microcytic anemia 10/12/2023   Iron deficiency anemia due to chronic blood loss  10/12/2023   Nausea without vomiting 10/12/2023   Colon cancer screening 10/02/2023   Elevated PSA 10/02/2023   Aphasia as late effect of cerebrovascular accident (CVA) 10/01/2023   Erectile dysfunction 10/01/2023   Prostate cancer screening 10/01/2023   COPD (chronic obstructive pulmonary disease) (HCC) 05/28/2023   Mild sleep apnea 05/28/2023   Periodic limb movement 05/28/2023   Loud snoring 03/03/2023   Dyspnea 03/03/2023   Normocytic anemia 01/13/2023   CKD (chronic kidney disease), stage III (HCC) 01/13/2023   History of CVA (cerebrovascular accident) 01/13/2023   Chronic systolic heart failure (HCC)    History of renal cell carcinoma    Diabetes mellitus (HCC) 02/01/2017   Essential hypertension 02/01/2017   Primary osteoarthritis of left hip 01/08/2016   DJD (degenerative joint disease) 03/20/2015   Primary osteoarthritis of right hip 02/26/2015   Lumbar spondylosis 10/17/2014   Abnormality of gait 07/25/2014   TIA (transient ischemic attack) 03/09/2013   Cocaine abuse (HCC) 03/09/2013   Hyperlipidemia 03/09/2013   Current smoker 03/09/2013    ONSET DATE: 10/01/2023   REFERRING DIAG: Z61.096 (ICD-10-CM) - Aphasia as late effect of cerebrovascular accident (CVA)  THERAPY DIAG:  Dysarthria and anarthria  Cognitive communication deficit  Rationale for Evaluation and Treatment: Rehabilitation  SUBJECTIVE:   SUBJECTIVE STATEMENT: "I can't get it out." Pt accompanied by: self and significant other  PERTINENT HISTORY: Raymond Barrera is a 62 yo male who was referred for outpatient SLP evaluation and treatment by Dr. Patterson Hammersmith (PCP). Pt with past medical history of CVA with expressive aphasia, HTN, COPD, type II DM, CKD, RCC s/p nephrectomy, restless leg syndrome and tobacco abuse. CVA with residual expressive aphasia and dysarthria. He was seen for outpatient SLP therapy in 2022.  PAIN:  Are you having pain? No  FALLS: Has patient fallen in last 6 months?   Yes  LIVING ENVIRONMENT: Lives with: lives with their family Lives in: House/apartment  PLOF:  Level of assistance: Independent with ADLs, Independent with IADLs Employment: On disability  PATIENT GOALS: Improve his ability to verbalize his thoughts  OBJECTIVE:  Note: Objective measures were completed at Evaluation unless otherwise noted.  DIAGNOSTIC FINDINGS:  MRI: Chronic infarct in the corona radiata on the left. Sequela of moderate chronic microvascular ischemic change. CT Head: Remote infarcts in the bilateral basal ganglia/corona radiata and left aspect of the corpus callosum and background chronic small-vessel ischemic change are noted.  COGNITION: Overall cognitive status: Impaired and History of cognitive impairments - at baseline Areas of impairment:  Attention: Impaired: Sustained Executive function: Impaired: Organization and Slow processing Functional deficits: assist at home from spouse as needed  AUDITORY COMPREHENSION: Overall auditory comprehension: Appears intact YES/NO questions: Appears intact Following directions: Appears intact Conversation: Moderately Complex Interfering components:  attention and processing speed Effective technique: extra processing time and repetition/stressing words  READING COMPREHENSION: Limited literacy  EXPRESSION: verbal  VERBAL EXPRESSION: Level of generative/spontaneous verbalization: sentence Automatic speech: name: intact and social response: intact  Repetition: Appears intact Naming: Responsive: 76-100% Pragmatics:  some emotional lability with laughing Comments: Pt reports frustration regarding communication and word finding difficulties Interfering components: speech intelligibility Effective technique: phonemic cues Non-verbal means of communication: N/A  WRITTEN EXPRESSION: Dominant hand: right Written expression: Not tested  MOTOR SPEECH: Overall motor speech: impaired Level of impairment:  Phrase Respiration: clavicular breathing Phonation: normal and mild harshness Resonance: WFL Articulation: Impaired: phrase Intelligibility: Intelligibility reduced Motor planning: Appears intact Motor speech errors: aware Interfering components:  N/A Effective technique: slow rate, increased vocal intensity, pause, and pacing  ORAL MOTOR EXAMINATION: Overall status: Impaired:   Labial: Right (ROM) Comments: N/A  STANDARDIZED ASSESSMENTS: SLUMS: 21/30  [facility] SLUMS Examination Orientation  3/3  Numeric Problem Solving  3/3  Memory  5/5  Attention 1/2  Thought Organization 3/3 (19 animals in 1 minute)  Clock Drawing 0/4  Visuospatial Skills               1/2  Short Story Recall  4/8  Total  20/30 21/30 for education    Scoring  High School Education  Less than High School Education   Normal  27-30 25-30  Mild Neurocognitive Disorder 21-26 20-24  Dementia  1-20 1-19      TODAY'S TREATMENT:  Evaluation only completed this date.                                                                                                                                     DATE: 10/13/23   PATIENT EDUCATION: Education details: Plan for SLP treatment once per week for 4 weeks Person educated: Patient and Spouse Education method: Explanation Education comprehension: verbalized understanding   GOALS: Goals reviewed with patient? Yes  SHORT TERM GOALS: Target date: 12/01/2023  Pt will increase conversation level speech intelligibility to 90% by utilizing appropriate strategies and listener needing repetition no more than 2x in a 10-minute conversation. Baseline: ~4x in 10 minutes Goal status: INITIAL  2.  Pt will complete moderate level verbal expression tasks (object description, state function, provide short summary, etc) using short sentences with 90% acc and min assist. Baseline: mi/mod assist Goal status: INITIAL  3.  Pt will implement word-finding strategies with 90%  accuracy when unable to verbalize desired word in conversation/functional tasks with min assist. Baseline: mi/mod assist Goal status: INITIAL  4.  Pt will answer open ended questions related to personally relevant information with 95% acc with min assist. Baseline: 90% Goal status: INITIAL   ASSESSMENT:  CLINICAL IMPRESSION: Patient is a 62 y.o. male who was seen today for a cognitive linguistic evaluation. He scored a 21/30 on the [facility] with errors in attention, memory, and executive functioning. He scored 11/15  on the [employer] test with greater difficulty with low frequency items (tripod, sphinx, palette). He provided appropriate content during picture description task with mildly reduced speech intelligibility. When asked what he would like to work on, he indicated that he needs help "getting words out" and that he has more trouble speaking to groups of people. His wife indicates that he becomes frustrated and "gives up". Pt with mild imprecise articulation and difficulty with multisyllabic words with some hesitations observed. He described a typical day as: wake up, pray, eat, get dressed, go outside, rake leaves, help his mom, and watch TV. Pt has a history of cognitive communication deficits from his previous strokes, but is motivated to participate in therapy again. Recommend short term SLP therapy to address the goals stated above, 1x per week for 4 weeks.  OBJECTIVE IMPAIRMENTS: include attention, executive functioning, and dysarthria. These impairments are limiting patient from effectively communicating at home and in community. Factors affecting potential to achieve goals and functional outcome are ability to learn/carryover information, previous level of function, and time post stroke onset . Patient will benefit from skilled SLP services to address above impairments and improve overall function.  REHAB POTENTIAL: Fair (time post stroke)  PLAN:  SLP FREQUENCY:  1x/week  SLP DURATION: 4 weeks  PLANNED INTERVENTIONS: [phone]- [address], [place], Phon, Eval Compre, Express, [phone] Treatment of speech (30 or 45 min) , Re-evaluation, Cueing hierachy, Cognitive reorganization, and Internal/external aids   Managed Medicaid Authorization Request  Visit Dx Codes: [phone] (ICD-10-CM) - Aphasia as late effect of cerebrovascular accident (CVA), R47.1, R41.841  Functional Tool Score: N/A  For all possible CPT codes, reference the Planned Interventions line above.     Check all conditions that are expected to impact treatment: {Conditions expected to impact treatment:N/A   If treatment provided at initial evaluation, no treatment charged due to lack of authorization.      Thank you,  Havery Moros, CCC-SLP [phone]  Boris Engelmann, CCC-SLP 10/13/2023, 6:35 PM

## 2023-10-13 NOTE — Telephone Encounter (Signed)
Copied from CRM [id number]. Topic: Clinical - Prescription Issue >> Oct 13, 2023  8:58 AM Mosetta Putt H wrote: Reason for CRM: Needs to speak with doctor about new prescriptions one is for pain , cholesterol and blood sugar medication is too strong he gets extremely dizzy , one medication is for sex its too expensive

## 2023-10-13 NOTE — Telephone Encounter (Signed)
Wife advised. 

## 2023-10-13 NOTE — Telephone Encounter (Signed)
ATC [name] x1.  LVM to return call.

## 2023-10-13 NOTE — Telephone Encounter (Signed)
[name] wife states patient needs refill for Anoro inhaler. Patient out of medication. Pharmacy is [facility] [name] phone number is [phone].

## 2023-10-14 ENCOUNTER — Ambulatory Visit: Payer: MEDICAID | Admitting: Primary Care

## 2023-10-14 MED ORDER — UMECLIDINIUM-VILANTEROL 62.5-25 MCG/ACT IN AEPB: 1.0000 | INHALATION_SPRAY | Freq: Every day | RESPIRATORY_TRACT | 3 refills | Status: DC

## 2023-10-14 NOTE — Telephone Encounter (Signed)
Unable to reach [name] x2.  LVM to return call.  Refills sent for Anoro to [facility] as requested.

## 2023-10-15 ENCOUNTER — Ambulatory Visit: Payer: MEDICAID | Admitting: Internal Medicine

## 2023-10-23 ENCOUNTER — Ambulatory Visit: Payer: MEDICAID | Admitting: Internal Medicine

## 2023-10-23 NOTE — Telephone Encounter (Signed)
Defer to PCP

## 2023-10-26 ENCOUNTER — Ambulatory Visit: Payer: MEDICAID | Admitting: Internal Medicine

## 2023-10-28 NOTE — Patient Instructions (Signed)
20    Your procedure is scheduled on: 11/05/2023  Report to [facility] Main Entrance at    8:30 AM.  Call this number if you have problems the morning of surgery: [phone]   Remember:   Follow instructions on letter from office regarding when to stop eating and drinking        No Smoking the day of procedure      Take these medicines the morning of surgery with A SIP OF WATER: Gabapentin  Hold Jardiance for 3 days prior to procedure  last dose !01/01/2023  Use inhalers if needed   Do not wear jewelry, make-up or nail polish.  Do not wear lotions, powders, or perfumes. You may wear deodorant.                Do not bring valuables to the hospital.  Contacts, dentures or bridgework may not be worn into surgery.  Leave suitcase in the car. After surgery it may be brought to your room.  For patients admitted to the hospital, checkout time is 11:00 AM the day of discharge.   Patients discharged the day of surgery will not be allowed to drive home. Upper Endoscopy, Adult Upper endoscopy is a procedure to look inside the upper GI (gastrointestinal) tract. The upper GI tract is made up of: The part of the body that moves food from your mouth to your stomach (esophagus). The stomach. The first part of your small intestine (duodenum). This procedure is also called esophagogastroduodenoscopy (EGD) or gastroscopy. In this procedure, your health care provider passes a thin, flexible tube (endoscope) through your mouth and down your esophagus into your stomach. A small camera is attached to the end of the tube. Images from the camera appear on a monitor in the exam room. During this procedure, your health care provider may also remove a small piece of tissue to be sent to a lab and examined under a microscope (biopsy). Your health care provider may do an upper endoscopy to diagnose cancers of the upper GI tract. You may also have this procedure to find the cause of other conditions, such  as: Stomach pain. Heartburn. Pain or problems when swallowing. Nausea and vomiting. Stomach bleeding. Stomach ulcers. Tell a health care provider about: Any allergies you have. All medicines you are taking, including vitamins, herbs, eye drops, creams, and over-the-counter medicines. Any problems you or family members have had with anesthetic medicines. Any blood disorders you have. Any surgeries you have had. Any medical conditions you have. Whether you are pregnant or may be pregnant. What are the risks? Generally, this is a safe procedure. However, problems may occur, including: Infection. Bleeding. Allergic reactions to medicines. A tear or hole (perforation) in the esophagus, stomach, or duodenum. What happens before the procedure? Staying hydrated Follow instructions from your health care provider about hydration, which may include: Up to 4 hours before the procedure - you may continue to drink clear liquids, such as water, clear fruit juice, black coffee, and plain tea.   Medicines Ask your health care provider about: Changing or stopping your regular medicines. This is especially important if you are taking diabetes medicines or blood thinners. Taking medicines such as aspirin and ibuprofen. These medicines can thin your blood. Do not take these medicines unless your health care provider tells you to take them. Taking over-the-counter medicines, vitamins, herbs, and supplements. General instructions Plan to have someone take you home from the hospital or clinic. If you will be going  home right after the procedure, plan to have someone with you for 24 hours. Ask your health care provider what steps will be taken to help prevent infection. What happens during the procedure?  An IV will be inserted into one of your veins. You may be given one or more of the following: A medicine to help you relax (sedative). A medicine to numb the throat (local anesthetic). You will lie  on your left side on an exam table. Your health care provider will pass the endoscope through your mouth and down your esophagus. Your health care provider will use the scope to check the inside of your esophagus, stomach, and duodenum. Biopsies may be taken. The endoscope will be removed. The procedure may vary among health care providers and hospitals. What happens after the procedure? Your blood pressure, heart rate, breathing rate, and blood oxygen level will be monitored until you leave the hospital or clinic. Do not drive for 24 hours if you were given a sedative during your procedure. When your throat is no longer numb, you may be given some fluids to drink. It is up to you to get the results of your procedure. Ask your health care provider, or the department that is doing the procedure, when your results will be ready. Summary Upper endoscopy is a procedure to look inside the upper GI tract. During the procedure, an IV will be inserted into one of your veins. You may be given a medicine to help you relax. A medicine will be used to numb your throat. The endoscope will be passed through your mouth and down your esophagus. This information is not intended to replace advice given to you by your health care provider. Make sure you discuss any questions you have with your health care provider. Document Revised: 05/12/2018 Document Reviewed: 04/19/2018 Elsevier Patient Education  2020 [employer]                                                                                                                                      EndoscopyCare After  Please read the instructions outlined below and refer to this sheet in the next few weeks. These discharge instructions provide you with general information on caring for yourself after you leave the hospital. Your doctor may also give you specific instructions. While your treatment has been planned according to the most current medical  practices available, unavoidable complications occasionally occur. If you have any problems or questions after discharge, please call your doctor. HOME CARE INSTRUCTIONS Activity You may resume your regular activity but move at a slower pace for the next 24 hours.  Take frequent rest periods for the next 24 hours.  Walking will help expel (get rid of) the air and reduce the bloated feeling in your abdomen.  No driving for 24 hours (because of the anesthesia (medicine) used during the test).  You may shower.  Do not sign  any important legal documents or operate any machinery for 24 hours (because of the anesthesia used during the test).  Nutrition Drink plenty of fluids.  You may resume your normal diet.  Begin with a light meal and progress to your normal diet.  Avoid alcoholic beverages for 24 hours or as instructed by your caregiver.  Medications You may resume your normal medications unless your caregiver tells you otherwise. What you can expect today You may experience abdominal discomfort such as a feeling of fullness or "gas" pains.  You may experience a sore throat for 2 to 3 days. This is normal. Gargling with salt water may help this.  Follow-up Your doctor will discuss the results of your test with you. SEEK IMMEDIATE MEDICAL CARE IF: You have excessive nausea (feeling sick to your stomach) and/or vomiting.  You have severe abdominal pain and distention (swelling).  You have trouble swallowing.  You have a temperature over 100 F (37.8 C).  You have rectal bleeding or vomiting of blood.  Document Released: 07/01/2004 Document Revised: 11/06/2011 Document Reviewed: 01/12/2008

## 2023-11-03 ENCOUNTER — Telehealth: Payer: Self-pay

## 2023-11-03 ENCOUNTER — Other Ambulatory Visit: Payer: Self-pay | Admitting: Internal Medicine

## 2023-11-03 ENCOUNTER — Telehealth (INDEPENDENT_AMBULATORY_CARE_PROVIDER_SITE_OTHER): Payer: Self-pay | Admitting: Gastroenterology

## 2023-11-03 ENCOUNTER — Encounter (HOSPITAL_COMMUNITY)
Admission: RE | Admit: 2023-11-03 | Discharge: 2023-11-03 | Disposition: A | Discharge status: Home / Self Care | Payer: MEDICAID | Source: Ambulatory Visit | Attending: Gastroenterology | Admitting: Gastroenterology

## 2023-11-03 VITALS — BP 150/107 | Temp 97.8°F | Resp 18 | Ht 78.0 in | Wt 243.6 lb

## 2023-11-03 DIAGNOSIS — D509 Iron deficiency anemia, unspecified: Secondary | ICD-10-CM | POA: Diagnosis not present

## 2023-11-03 DIAGNOSIS — I10 Essential (primary) hypertension: Secondary | ICD-10-CM

## 2023-11-03 DIAGNOSIS — E1159 Type 2 diabetes mellitus with other circulatory complications: Secondary | ICD-10-CM | POA: Insufficient documentation

## 2023-11-03 DIAGNOSIS — F1410 Cocaine abuse, uncomplicated: Secondary | ICD-10-CM | POA: Diagnosis not present

## 2023-11-03 DIAGNOSIS — Z01818 Encounter for other preprocedural examination: Secondary | ICD-10-CM | POA: Diagnosis present

## 2023-11-03 DIAGNOSIS — D631 Anemia in chronic kidney disease: Secondary | ICD-10-CM | POA: Diagnosis not present

## 2023-11-03 DIAGNOSIS — N1832 Chronic kidney disease, stage 3b: Secondary | ICD-10-CM | POA: Diagnosis not present

## 2023-11-03 DIAGNOSIS — Z01812 Encounter for preprocedural laboratory examination: Secondary | ICD-10-CM | POA: Diagnosis not present

## 2023-11-03 DIAGNOSIS — E1122 Type 2 diabetes mellitus with diabetic chronic kidney disease: Secondary | ICD-10-CM | POA: Insufficient documentation

## 2023-11-03 LAB — CBC WITH DIFFERENTIAL/PLATELET
Abs Immature Granulocytes: 0.02 10*3/uL (ref 0.00–0.07)
Basophils Absolute: 0 10*3/uL (ref 0.0–0.1)
Basophils Relative: 0 %
Eosinophils Absolute: 0.1 10*3/uL (ref 0.0–0.5)
Eosinophils Relative: 2 %
HCT: 29.3 % — ABNORMAL LOW (ref 39.0–52.0)
Hemoglobin: 8.4 g/dL — ABNORMAL LOW (ref 13.0–17.0)
Immature Granulocytes: 0 %
Lymphocytes Relative: 21 %
Lymphs Abs: 1.2 10*3/uL (ref 0.7–4.0)
MCH: 20.9 pg — ABNORMAL LOW (ref 26.0–34.0)
MCHC: 28.7 g/dL — ABNORMAL LOW (ref 30.0–36.0)
MCV: 73.1 fL — ABNORMAL LOW (ref 80.0–100.0)
Monocytes Absolute: 0.4 10*3/uL (ref 0.1–1.0)
Monocytes Relative: 7 %
Neutro Abs: 4 10*3/uL (ref 1.7–7.7)
Neutrophils Relative %: 70 %
Platelets: 372 10*3/uL (ref 150–400)
RBC: 4.01 MIL/uL — ABNORMAL LOW (ref 4.22–5.81)
RDW: 18.9 % — ABNORMAL HIGH (ref 11.5–15.5)
WBC: 5.8 10*3/uL (ref 4.0–10.5)
nRBC: 0 % (ref 0.0–0.2)

## 2023-11-03 LAB — BASIC METABOLIC PANEL
Anion gap: 8 (ref 5–15)
BUN: 26 mg/dL — ABNORMAL HIGH (ref 8–23)
CO2: 23 mmol/L (ref 22–32)
Calcium: 10.8 mg/dL — ABNORMAL HIGH (ref 8.9–10.3)
Chloride: 108 mmol/L (ref 98–111)
Creatinine, Ser: 1.96 mg/dL — ABNORMAL HIGH (ref 0.61–1.24)
GFR, Estimated: 37 mL/min — ABNORMAL LOW (ref >60)
Glucose, Bld: 127 mg/dL — ABNORMAL HIGH (ref 70–99)
Potassium: 4.4 mmol/L (ref 3.5–5.1)
Sodium: 139 mmol/L (ref 135–145)

## 2023-11-03 LAB — RAPID URINE DRUG SCREEN, HOSP PERFORMED
Amphetamines: NOT DETECTED
Barbiturates: NOT DETECTED
Benzodiazepines: NOT DETECTED
Cocaine: POSITIVE — AB
Opiates: NOT DETECTED
Tetrahydrocannabinol: NOT DETECTED

## 2023-11-03 MED ORDER — AMLODIPINE BESYLATE 5 MG PO TABS: 5.0000 mg | ORAL_TABLET | Freq: Every day | ORAL | 0 refills | Status: DC

## 2023-11-03 NOTE — Telephone Encounter (Signed)
Jethro Bolus, RN  Marlowe Shores, LPN; Laurence Ferrari, Avie Arenas, RN Patient is having episodes of dizziness and elevated blood pressure.  Patient wants to cancel procedure until he follows up with his primary doctor about these issues.

## 2023-11-03 NOTE — Progress Notes (Signed)
Patient is having episodes of dizziness and elevated blood pressure.  Wife and patient want to cancel the TCS/EGD until follow up appointment with the primary care physician. Office and scheduler notified.

## 2023-11-03 NOTE — Telephone Encounter (Signed)
Copied from CRM [id number]. Topic: Clinical - Prescription Issue >> Nov 03, 2023 11:49 AM Orinda Kenner C wrote: Reason for CRM: Graciella Belton pt's wife [phone] states pt is very dizzy from ramipril (ALTACE) 10 MG capsule started for a few weeks ago, but within the past 4-5 days more frequent, bp goes on the high side when pt is feeling dizzy. Pls advise, c/b Dianne.

## 2023-11-03 NOTE — Telephone Encounter (Signed)
Thank you for updating   Patient Raymond Barrera was positive for Coccaine which would have been an issue getting anesthesia also. Patient can reach out back to use to rescheduling whenever he is optimized and have complete coccaine cessation

## 2023-11-04 NOTE — Telephone Encounter (Signed)
Wife advised. 

## 2023-11-05 ENCOUNTER — Ambulatory Visit: Payer: MEDICAID | Admitting: Urology

## 2023-11-05 ENCOUNTER — Ambulatory Visit (HOSPITAL_COMMUNITY): Admission: RE | Admit: 2023-11-05 | Payer: MEDICAID | Source: Home / Self Care

## 2023-11-05 ENCOUNTER — Encounter (HOSPITAL_COMMUNITY): Admission: RE | Payer: Self-pay | Source: Home / Self Care

## 2023-11-05 SURGERY — ESOPHAGOGASTRODUODENOSCOPY (EGD) WITH PROPOFOL
Anesthesia: Monitor Anesthesia Care

## 2023-11-09 ENCOUNTER — Telehealth (HOSPITAL_COMMUNITY): Payer: Self-pay | Admitting: Speech Pathology

## 2023-11-09 ENCOUNTER — Ambulatory Visit (HOSPITAL_COMMUNITY): Payer: MEDICAID | Attending: Internal Medicine | Admitting: Speech Pathology

## 2023-11-09 DIAGNOSIS — R471 Dysarthria and anarthria: Secondary | ICD-10-CM | POA: Insufficient documentation

## 2023-11-09 DIAGNOSIS — R41841 Cognitive communication deficit: Secondary | ICD-10-CM | POA: Insufficient documentation

## 2023-11-09 NOTE — Telephone Encounter (Signed)
Telephone Call:  Pt did not show for his SLP session today. Voice mail left for a return call.  Thank you,  Havery Moros, CCC-SLP [phone]

## 2023-11-12 ENCOUNTER — Encounter: Payer: Self-pay | Admitting: Primary Care

## 2023-11-12 ENCOUNTER — Ambulatory Visit: Payer: MEDICAID | Admitting: Primary Care

## 2023-11-12 ENCOUNTER — Ambulatory Visit: Payer: MEDICAID | Admitting: Urology

## 2023-11-16 ENCOUNTER — Ambulatory Visit: Payer: MEDICAID | Admitting: Internal Medicine

## 2023-11-17 ENCOUNTER — Encounter (HOSPITAL_COMMUNITY): Payer: MEDICAID | Admitting: Speech Pathology

## 2023-11-19 ENCOUNTER — Ambulatory Visit: Payer: MEDICAID | Admitting: Internal Medicine

## 2023-11-23 ENCOUNTER — Ambulatory Visit (HOSPITAL_COMMUNITY): Payer: MEDICAID | Admitting: Speech Pathology

## 2023-11-23 ENCOUNTER — Encounter (HOSPITAL_COMMUNITY): Payer: Self-pay | Admitting: Speech Pathology

## 2023-11-23 DIAGNOSIS — R471 Dysarthria and anarthria: Secondary | ICD-10-CM

## 2023-11-23 DIAGNOSIS — R41841 Cognitive communication deficit: Secondary | ICD-10-CM

## 2023-11-23 NOTE — Therapy (Signed)
OUTPATIENT SPEECH LANGUAGE PATHOLOGY TREATMENT   Patient Name: Raymond Barrera. MRN: [id number] DOB:20-Sep-1961, 62 y.o., male 15 Date: 11/23/2023  PCP: Anabel Halon, MD REFERRING PROVIDER: Anabel Halon, MD  END OF SESSION:  End of Session - 11/23/23 1050     Visit Number 2    Number of Visits 5    Date for SLP Re-Evaluation 12/01/23    Authorization Type [employer]    SLP Start Time 1030    SLP Stop Time  1115    SLP Time Calculation (min) 45 min    Activity Tolerance Patient tolerated treatment well             Past Medical History:  Diagnosis Date   Ambulates with cane    straight - uses occasionally   Anxiety    due to the stroke   Arthritis    Back pain    hx of buldging disc   Complication of anesthesia    took a while for him to wake up after previous anesthesia   CVA (cerebral vascular accident) (HCC) 10/07/2020   Diabetes mellitus without complication (HCC)    Family history of adverse reaction to anesthesia    "sometimes mom has a hard time waking up"   High cholesterol    takes Zocor daily   Hypertension    takes Benazepril and HCTZ  daily   Joint pain    Joint swelling    Memory impairment    occassional - from stroke   Myocardial infarction (HCC) 1987   Pneumonia    hx of-80's   Shortness of breath dyspnea    do to pain   Sleep apnea    never had a sleep study,but states Dr. Janna Arch says he has it   Slurred speech    Stroke (HCC) 08/2013   7 mini-strokes, last stroke 2017   TIA (transient ischemic attack) 2014   x 7    Urinary frequency    Past Surgical History:  Procedure Laterality Date   ABDOMINAL EXPOSURE N/A 06/16/2018   Procedure: ABDOMINAL EXPOSURE;  Surgeon: Larina Earthly, MD;  Location: [facility] OR;  Service: Vascular;  Laterality: N/A;   ANKLE SURGERY  2008   left ankle-otif-[facility]   ANTERIOR LUMBAR FUSION Bilateral 06/16/2018   Procedure: LUMBAR 4-5 LUMBAR 5-SACRUM 1 ANTERIOR LUMBAR INTERBODY FUSION WITH  INSTRUMENTATION AND ALLOGRAFT;  Surgeon: Estill Bamberg, MD;  Location: [facility] OR;  Service: Orthopedics;  Laterality: Bilateral;   BACK SURGERY     HEMATOMA EVACUATION Left 08/13/2018   Procedure: EVACUATION HEMATOMA LEFT ABDOMINAL WALL;  Surgeon: Larina Earthly, MD;  Location: [facility] OR;  Service: Vascular;  Laterality: Left;   IR RADIOLOGIST EVAL & MGMT  07/27/2018   IR US GUIDE BX ASP/DRAIN  07/14/2018   JOINT REPLACEMENT     both hips replaced    LUMBAR LAMINECTOMY/DECOMPRESSION MICRODISCECTOMY Right 10/17/2014   Procedure: LUMBAR LAMINECTOMY/DECOMPRESSION MICRODISCECTOMY 2 LEVELS;  Surgeon: Lisbeth Renshaw, MD;  Location: [facility] NEURO ORS;  Service: Neurosurgery;  Laterality: Right;  Right L45 L5S1 laminectomy and foraminotomy   MASS EXCISION  09/13/2012   Procedure: EXCISION MASS;  Surgeon: Dalia Heading, MD;  Location: [facility] ORS;  Service: General;  Laterality: N/A;   ROBOTIC ASSITED PARTIAL NEPHRECTOMY Left 03/30/2019   Procedure: XI ROBOTIC ASSITED PARTIAL NEPHRECTOMY POSSIBLE RADICAL NEPHRECTOMY;  Surgeon: Rene Paci, MD;  Location: [facility] ORS;  Service: Urology;  Laterality: Left;   SHOULDER ARTHROSCOPY WITH ROTATOR CUFF REPAIR Right 04/12/2020  Procedure: right shoulder arthroscopy, debridement, mini open rotator cuff tear repair;  Surgeon: Cammy Copa, MD;  Location: [facility] OR;  Service: Orthopedics;  Laterality: Right;   TONSILLECTOMY     TOTAL HIP ARTHROPLASTY Right 03/20/2015   TOTAL HIP ARTHROPLASTY Right 03/20/2015   Procedure: TOTAL HIP ARTHROPLASTY ANTERIOR APPROACH;  Surgeon: Sheral Apley, MD;  Location: [facility] OR;  Service: Orthopedics;  Laterality: Right;   TOTAL HIP ARTHROPLASTY Left 01/08/2016   Procedure: TOTAL HIP ARTHROPLASTY ANTERIOR APPROACH;  Surgeon: Sheral Apley, MD;  Location: [facility] OR;  Service: Orthopedics;  Laterality: Left;   Patient Active Problem List   Diagnosis Date Noted   Microcytic anemia 10/12/2023   Iron deficiency anemia due to chronic blood loss  10/12/2023   Nausea without vomiting 10/12/2023   Colon cancer screening 10/02/2023   Elevated PSA 10/02/2023   Aphasia as late effect of cerebrovascular accident (CVA) 10/01/2023   Erectile dysfunction 10/01/2023   Prostate cancer screening 10/01/2023   COPD (chronic obstructive pulmonary disease) (HCC) 05/28/2023   Mild sleep apnea 05/28/2023   Periodic limb movement 05/28/2023   Loud snoring 03/03/2023   Dyspnea 03/03/2023   Normocytic anemia 01/13/2023   CKD (chronic kidney disease), stage III (HCC) 01/13/2023   History of CVA (cerebrovascular accident) 01/13/2023   Chronic systolic heart failure (HCC)    History of renal cell carcinoma    Diabetes mellitus (HCC) 02/01/2017   Essential hypertension 02/01/2017   Primary osteoarthritis of left hip 01/08/2016   DJD (degenerative joint disease) 03/20/2015   Primary osteoarthritis of right hip 02/26/2015   Lumbar spondylosis 10/17/2014   Abnormality of gait 07/25/2014   TIA (transient ischemic attack) 03/09/2013   Cocaine abuse (HCC) 03/09/2013   Hyperlipidemia 03/09/2013   Current smoker 03/09/2013    ONSET DATE: 10/01/2023   REFERRING DIAG: K44.010 (ICD-10-CM) - Aphasia as late effect of cerebrovascular accident (CVA)  THERAPY DIAG:  Dysarthria and anarthria  Cognitive communication deficit  Rationale for Evaluation and Treatment: Rehabilitation  SUBJECTIVE:   SUBJECTIVE STATEMENT: "I tried to give my testimony at church."  Pt accompanied by: self  PERTINENT HISTORY: Raymond Barrera is a 62 yo male who was referred for outpatient SLP evaluation and treatment by Dr. Patterson Hammersmith (PCP). Pt with past medical history of CVA with expressive aphasia, HTN, COPD, type II DM, CKD, RCC s/p nephrectomy, restless leg syndrome and tobacco abuse. CVA with residual expressive aphasia and dysarthria. He was seen for outpatient SLP therapy in 2022.  PAIN:  Are you having pain? No  PATIENT GOALS: Improve his ability to verbalize his  thoughts  OBJECTIVE:  Note: Objective measures were completed at Evaluation unless otherwise noted.  DIAGNOSTIC FINDINGS:  MRI: Chronic infarct in the corona radiata on the left. Sequela of moderate chronic microvascular ischemic change. CT Head: Remote infarcts in the bilateral basal ganglia/corona radiata and left aspect of the corpus callosum and background chronic small-vessel ischemic change are noted.  PATIENT EDUCATION: Education details: implement word finding strategies at home Person educated: Patient and Spouse Education method: Explanation Education comprehension: verbalized understanding  TODAY'S TREATMENT:  Pt initially noted to be more dysfluent at the start of our session today. He was encouraged to stop and slowly exhale and start again slowly. He started reciting automatics (counting, days of the week, etc) at first in an effort to achieve fluency, but then he seemed to lose his train of thought and was unable to get back what he wanted to say. Pt shared that  he "froze" when he was giving his testimony at church and his wife needed to help him. SLP asked Pt to share what he wanted to say, by answering "who, what, when, where, etc" which helped narrow his focus. He verbalized 5 separate thoughts and SLP wrote down. He then listened to a short paragraph and answered questions with 80% acc with min cues provided. Continue to address fluency and word finding next session.                                                                                                     DATE: 11/23/23 GOALS: Goals reviewed with patient? Yes  SHORT TERM GOALS: Target date: 12/01/2023  Pt will increase conversation level speech intelligibility to 90% by utilizing appropriate strategies and listener needing repetition no more than 2x in a 10-minute conversation. Baseline: ~4x in 10 minutes Goal status: ONGOING  2.  Pt will complete moderate level verbal expression tasks (object description,  state function, provide short summary, etc) using short sentences with 90% acc and min assist. Baseline: mi/mod assist Goal status: ONGOING  3.  Pt will implement word-finding strategies with 90% accuracy when unable to verbalize desired word in conversation/functional tasks with min assist. Baseline: mi/mod assist Goal status: ONGOING  4.  Pt will answer open ended questions related to personally relevant information with 95% acc with min assist. Baseline: 90% Goal status: ONGOING   ASSESSMENT:  CLINICAL IMPRESSION: (from initial evaluation 10/13/23) Patient is a 62 y.o. male who was seen today for a cognitive linguistic evaluation. He scored a 21/30 on the [facility] with errors in attention, memory, and executive functioning. He scored 11/15 on the [facility] test with greater difficulty with low frequency items (tripod, sphinx, palette). He provided appropriate content during picture description task with mildly reduced speech intelligibility. When asked what he would like to work on, he indicated that he needs help "getting words out" and that he has more trouble speaking to groups of people. His wife indicates that he becomes frustrated and "gives up". Pt with mild imprecise articulation and difficulty with multisyllabic words with some hesitations observed. He described a typical day as: wake up, pray, eat, get dressed, go outside, rake leaves, help his mom, and watch TV. Pt has a history of cognitive communication deficits from his previous strokes, but is motivated to participate in therapy again. Recommend short term SLP therapy to address the goals stated above, 1x per week for 4 weeks.  OBJECTIVE IMPAIRMENTS: include attention, executive functioning, and dysarthria. These impairments are limiting patient from effectively communicating at home and in community. Factors affecting potential to achieve goals and functional outcome are ability to learn/carryover information,  previous level of function, and time post stroke onset . Patient will benefit from skilled SLP services to address above impairments and improve overall function.  REHAB POTENTIAL: Fair (time post stroke)  PLAN:  SLP FREQUENCY: 1x/week  SLP DURATION: 4 weeks  PLANNED INTERVENTIONS: [phone], Artic, Phon, Eval Compre, Express, [phone] Treatment of speech (30 or 45 min) , Re-evaluation,  Cueing hierachy, Cognitive reorganization, and Internal/external aids   Managed Medicaid Authorization Request  Visit Dx Codes: [phone] (ICD-10-CM) - Aphasia as late effect of cerebrovascular accident (CVA), R47.1, R41.841  Functional Tool Score: N/A  For all possible CPT codes, reference the Planned Interventions line above.     Check all conditions that are expected to impact treatment: {Conditions expected to impact treatment:N/A   If treatment provided at initial evaluation, no treatment charged due to lack of authorization.      Thank you,  Havery Moros, CCC-SLP [phone]  Tashena Ibach, CCC-SLP 11/23/2023, 10:51 AM

## 2023-11-26 ENCOUNTER — Encounter (HOSPITAL_COMMUNITY): Payer: Self-pay | Admitting: Speech Pathology

## 2023-11-26 ENCOUNTER — Ambulatory Visit (HOSPITAL_COMMUNITY): Payer: MEDICAID | Admitting: Speech Pathology

## 2023-11-26 DIAGNOSIS — R471 Dysarthria and anarthria: Secondary | ICD-10-CM

## 2023-11-26 DIAGNOSIS — R41841 Cognitive communication deficit: Secondary | ICD-10-CM

## 2023-11-26 NOTE — Therapy (Signed)
OUTPATIENT SPEECH LANGUAGE PATHOLOGY TREATMENT   Patient Name: Raymond Barrera. MRN: [id number] DOB:12/27/1960, 62 y.o., male 40 Date: 11/26/2023  PCP: Raymond Halon, MD REFERRING PROVIDER: Anabel Halon, MD  END OF SESSION:  End of Session - 11/26/23 1113     Visit Number 3    Number of Visits 5    Date for SLP Re-Evaluation 12/01/23    Authorization Type [employer]    SLP Start Time 1105    SLP Stop Time  1145    SLP Time Calculation (min) 40 min    Activity Tolerance Patient tolerated treatment well             Past Medical History:  Diagnosis Date   Ambulates with cane    straight - uses occasionally   Anxiety    due to the stroke   Arthritis    Back pain    hx of buldging disc   Complication of anesthesia    took a while for him to wake up after previous anesthesia   CVA (cerebral vascular accident) (HCC) 10/07/2020   Diabetes mellitus without complication (HCC)    Family history of adverse reaction to anesthesia    "sometimes mom has a hard time waking up"   High cholesterol    takes Zocor daily   Hypertension    takes Benazepril and HCTZ  daily   Joint pain    Joint swelling    Memory impairment    occassional - from stroke   Myocardial infarction (HCC) 1987   Pneumonia    hx of-80's   Shortness of breath dyspnea    do to pain   Sleep apnea    never had a sleep study,but states Dr. Janna Barrera says he has it   Slurred speech    Stroke (HCC) 08/2013   7 mini-strokes, last stroke 2017   TIA (transient ischemic attack) 2014   x 7    Urinary frequency    Past Surgical History:  Procedure Laterality Date   ABDOMINAL EXPOSURE N/A 06/16/2018   Procedure: ABDOMINAL EXPOSURE;  Surgeon: Larina Earthly, MD;  Location: [facility] OR;  Service: Vascular;  Laterality: N/A;   ANKLE SURGERY  2008   left ankle-otif-[facility]   ANTERIOR LUMBAR FUSION Bilateral 06/16/2018   Procedure: LUMBAR 4-5 LUMBAR 5-SACRUM 1 ANTERIOR LUMBAR INTERBODY FUSION WITH  INSTRUMENTATION AND ALLOGRAFT;  Surgeon: Estill Bamberg, MD;  Location: [facility] OR;  Service: Orthopedics;  Laterality: Bilateral;   BACK SURGERY     HEMATOMA EVACUATION Left 08/13/2018   Procedure: EVACUATION HEMATOMA LEFT ABDOMINAL WALL;  Surgeon: Larina Earthly, MD;  Location: [facility] OR;  Service: Vascular;  Laterality: Left;   IR RADIOLOGIST EVAL & MGMT  07/27/2018   IR US GUIDE BX ASP/DRAIN  07/14/2018   JOINT REPLACEMENT     both hips replaced    LUMBAR LAMINECTOMY/DECOMPRESSION MICRODISCECTOMY Right 10/17/2014   Procedure: LUMBAR LAMINECTOMY/DECOMPRESSION MICRODISCECTOMY 2 LEVELS;  Surgeon: Lisbeth Renshaw, MD;  Location: [facility];  Service: Neurosurgery;  Laterality: Right;  Right L45 L5S1 laminectomy and foraminotomy   MASS EXCISION  09/13/2012   Procedure: EXCISION MASS;  Surgeon: Dalia Heading, MD;  Location: [facility] ORS;  Service: General;  Laterality: N/A;   ROBOTIC ASSITED PARTIAL NEPHRECTOMY Left 03/30/2019   Procedure: XI ROBOTIC ASSITED PARTIAL NEPHRECTOMY POSSIBLE RADICAL NEPHRECTOMY;  Surgeon: Rene Paci, MD;  Location: [facility] ORS;  Service: Urology;  Laterality: Left;   SHOULDER ARTHROSCOPY WITH ROTATOR CUFF REPAIR Right 04/12/2020  Procedure: right shoulder arthroscopy, debridement, mini open rotator cuff tear repair;  Surgeon: Cammy Copa, MD;  Location: [facility] OR;  Service: Orthopedics;  Laterality: Right;   TONSILLECTOMY     TOTAL HIP ARTHROPLASTY Right 03/20/2015   TOTAL HIP ARTHROPLASTY Right 03/20/2015   Procedure: TOTAL HIP ARTHROPLASTY ANTERIOR APPROACH;  Surgeon: Sheral Apley, MD;  Location: [facility] OR;  Service: Orthopedics;  Laterality: Right;   TOTAL HIP ARTHROPLASTY Left 01/08/2016   Procedure: TOTAL HIP ARTHROPLASTY ANTERIOR APPROACH;  Surgeon: Sheral Apley, MD;  Location: [facility] OR;  Service: Orthopedics;  Laterality: Left;   Patient Active Problem List   Diagnosis Date Noted   Microcytic anemia 10/12/2023   Iron deficiency anemia due to chronic blood loss  10/12/2023   Nausea without vomiting 10/12/2023   Colon cancer screening 10/02/2023   Elevated PSA 10/02/2023   Aphasia as late effect of cerebrovascular accident (CVA) 10/01/2023   Erectile dysfunction 10/01/2023   Prostate cancer screening 10/01/2023   COPD (chronic obstructive pulmonary disease) (HCC) 05/28/2023   Mild sleep apnea 05/28/2023   Periodic limb movement 05/28/2023   Loud snoring 03/03/2023   Dyspnea 03/03/2023   Normocytic anemia 01/13/2023   CKD (chronic kidney disease), stage III (HCC) 01/13/2023   History of CVA (cerebrovascular accident) 01/13/2023   Chronic systolic heart failure (HCC)    History of renal cell carcinoma    Diabetes mellitus (HCC) 02/01/2017   Essential hypertension 02/01/2017   Primary osteoarthritis of left hip 01/08/2016   DJD (degenerative joint disease) 03/20/2015   Primary osteoarthritis of right hip 02/26/2015   Lumbar spondylosis 10/17/2014   Abnormality of gait 07/25/2014   TIA (transient ischemic attack) 03/09/2013   Cocaine abuse (HCC) 03/09/2013   Hyperlipidemia 03/09/2013   Current smoker 03/09/2013    ONSET DATE: 10/01/2023   REFERRING DIAG: W09.811 (ICD-10-CM) - Aphasia as late effect of cerebrovascular accident (CVA)  THERAPY DIAG:  Dysarthria and anarthria  Cognitive communication deficit  Rationale for Evaluation and Treatment: Rehabilitation  SUBJECTIVE:   SUBJECTIVE STATEMENT: "I want to keep trying."  Pt accompanied by: self  PERTINENT HISTORY: Raymond Barrera is a 62 yo male who was referred for outpatient SLP evaluation and treatment by Dr. Patterson Barrera (PCP). Pt with past medical history of CVA with expressive aphasia, HTN, COPD, type II DM, CKD, RCC s/p nephrectomy, restless leg syndrome and tobacco abuse. CVA with residual expressive aphasia and dysarthria. He was seen for outpatient SLP therapy in 2022.  PAIN:  Are you having pain? No  PATIENT GOALS: Improve his ability to verbalize his  thoughts  OBJECTIVE:  Note: Objective measures were completed at Evaluation unless otherwise noted.  DIAGNOSTIC FINDINGS:  MRI: Chronic infarct in the corona radiata on the left. Sequela of moderate chronic microvascular ischemic change. CT Head: Remote infarcts in the bilateral basal ganglia/corona radiata and left aspect of the corpus callosum and background chronic small-vessel ischemic change are noted.  PATIENT EDUCATION: Education details: Provided list of words to use for word finding activities at home Person educated: Patient and Spouse Education method: Explanation Education comprehension: verbalized understanding  Previous treatment: Pt initially noted to be more dysfluent at the start of our session today. He was encouraged to stop and slowly exhale and start again slowly. He started reciting automatics (counting, days of the week, etc) at first in an effort to achieve fluency, but then he seemed to lose his train of thought and was unable to get back what he wanted to say. Pt  shared that he "froze" when he was giving his testimony at church and his wife needed to help him. SLP asked Pt to share what he wanted to say, by answering "who, what, when, where, etc" which helped narrow his focus. He verbalized 5 separate thoughts and SLP wrote down. He then listened to a short paragraph and answered questions with 80% acc with min cues provided. Continue to address fluency and word finding next session.                                                                                                     TODAY'S TREATMENT: Pt accompanied to therapy by his wife in a session that was added on for Pt. SLP reviewed fluency enhancing strategies of slowly exhaling and then verbalizing slowly. He participated in conversation with SLP with occasional delays in responses. His wife offered assistance with supplying words when he appeared to pause or look to her. SLP encouraged both to wait before  jumping in to assist and let him try to verbalize. He named items to description with 100% acc. He then was given a word and asked to describe by three salient features and completed with 90% acc. He was then shown a picture of an object and asked to describe in a barrier task with wife. He required additional cueing to describe by function, location, association, and what it looks like and completed with 90% acc with mi/mod assist. He was encouraged to continue practicing with list of words sent home.  DATE: 11/26/23  GOALS: Goals reviewed with patient? Yes  SHORT TERM GOALS: Target date: 12/01/2023  Pt will increase conversation level speech intelligibility to 90% by utilizing appropriate strategies and listener needing repetition no more than 2x in a 10-minute conversation. Baseline: ~4x in 10 minutes Goal status: ONGOING  2.  Pt will complete moderate level verbal expression tasks (object description, state function, provide short summary, etc) using short sentences with 90% acc and min assist. Baseline: mi/mod assist Goal status: ONGOING  3.  Pt will implement word-finding strategies with 90% accuracy when unable to verbalize desired word in conversation/functional tasks with min assist. Baseline: mi/mod assist Goal status: ONGOING  4.  Pt will answer open ended questions related to personally relevant information with 95% acc with min assist. Baseline: 90% Goal status: ONGOING   ASSESSMENT:  CLINICAL IMPRESSION: (from initial evaluation 10/13/23) Patient is a 62 y.o. male who was seen today for a cognitive linguistic evaluation. He scored a 21/30 on the [facility] with errors in attention, memory, and executive functioning. He scored 11/15 on the [facility] test with greater difficulty with low frequency items (tripod, sphinx, palette). He provided appropriate content during picture description task with mildly reduced speech intelligibility. When asked what he would like  to work on, he indicated that he needs help "getting words out" and that he has more trouble speaking to groups of people. His wife indicates that he becomes frustrated and "gives up". Pt with mild imprecise articulation and difficulty with multisyllabic words with some hesitations observed. He described a  typical day as: wake up, pray, eat, get dressed, go outside, rake leaves, help his mom, and watch TV. Pt has a history of cognitive communication deficits from his previous strokes, but is motivated to participate in therapy again. Recommend short term SLP therapy to address the goals stated above, 1x per week for 4 weeks.  OBJECTIVE IMPAIRMENTS: include attention, executive functioning, and dysarthria. These impairments are limiting patient from effectively communicating at home and in community. Factors affecting potential to achieve goals and functional outcome are ability to learn/carryover information, previous level of function, and time post stroke onset . Patient will benefit from skilled SLP services to address above impairments and improve overall function.  REHAB POTENTIAL: Fair (time post stroke)  PLAN:  SLP FREQUENCY: 1x/week  SLP DURATION: 4 weeks  PLANNED INTERVENTIONS: [phone]- [address] Artic, Phon, Eval Compre, Express, [phone] Treatment of speech (30 or 45 min) , Re-evaluation, Cueing hierachy, Cognitive reorganization, and Internal/external aids   Managed Medicaid Authorization Request  Visit Dx Codes: [phone] (ICD-10-CM) - Aphasia as late effect of cerebrovascular accident (CVA), R47.1, R41.841  Functional Tool Score: N/A  For all possible CPT codes, reference the Planned Interventions line above.     Check all conditions that are expected to impact treatment: {Conditions expected to impact treatment:N/A   If treatment provided at initial evaluation, no treatment charged due to lack of authorization.      Thank you,  Havery Moros,  CCC-SLP [phone]  Dallis Darden, CCC-SLP 11/26/2023, 12:13 PM

## 2023-11-30 ENCOUNTER — Encounter (HOSPITAL_COMMUNITY): Payer: Self-pay | Admitting: Speech Pathology

## 2023-11-30 ENCOUNTER — Ambulatory Visit (HOSPITAL_COMMUNITY): Payer: MEDICAID | Admitting: Speech Pathology

## 2023-11-30 DIAGNOSIS — R41841 Cognitive communication deficit: Secondary | ICD-10-CM

## 2023-11-30 DIAGNOSIS — R471 Dysarthria and anarthria: Secondary | ICD-10-CM

## 2023-11-30 NOTE — Therapy (Signed)
OUTPATIENT SPEECH LANGUAGE PATHOLOGY TREATMENT   Patient Name: Raymond Barrera. MRN: [id number] DOB:11-04-1961, 62 y.o., male 6 Date: 11/30/2023  PCP: Anabel Halon, MD REFERRING PROVIDER: Anabel Halon, MD  END OF SESSION:  End of Session - 11/30/23 1028     Visit Number 4    Number of Visits 8    Date for SLP Re-Evaluation 12/31/23    Authorization Type [employer]    SLP Start Time 1015    SLP Stop Time  1100    SLP Time Calculation (min) 45 min    Activity Tolerance Patient tolerated treatment well             Past Medical History:  Diagnosis Date   Ambulates with cane    straight - uses occasionally   Anxiety    due to the stroke   Arthritis    Back pain    hx of buldging disc   Complication of anesthesia    took a while for him to wake up after previous anesthesia   CVA (cerebral vascular accident) (HCC) 10/07/2020   Diabetes mellitus without complication (HCC)    Family history of adverse reaction to anesthesia    "sometimes mom has a hard time waking up"   High cholesterol    takes Zocor daily   Hypertension    takes Benazepril and HCTZ  daily   Joint pain    Joint swelling    Memory impairment    occassional - from stroke   Myocardial infarction (HCC) 1987   Pneumonia    hx of-80's   Shortness of breath dyspnea    do to pain   Sleep apnea    never had a sleep study,but states Dr. Janna Arch says he has it   Slurred speech    Stroke (HCC) 08/2013   7 mini-strokes, last stroke 2017   TIA (transient ischemic attack) 2014   x 7    Urinary frequency    Past Surgical History:  Procedure Laterality Date   ABDOMINAL EXPOSURE N/A 06/16/2018   Procedure: ABDOMINAL EXPOSURE;  Surgeon: Larina Earthly, MD;  Location: [facility] OR;  Service: Vascular;  Laterality: N/A;   ANKLE SURGERY  2008   left ankle-otif-[facility]   ANTERIOR LUMBAR FUSION Bilateral 06/16/2018   Procedure: LUMBAR 4-5 LUMBAR 5-SACRUM 1 ANTERIOR LUMBAR INTERBODY FUSION WITH  INSTRUMENTATION AND ALLOGRAFT;  Surgeon: Estill Bamberg, MD;  Location: [facility] OR;  Service: Orthopedics;  Laterality: Bilateral;   BACK SURGERY     HEMATOMA EVACUATION Left 08/13/2018   Procedure: EVACUATION HEMATOMA LEFT ABDOMINAL WALL;  Surgeon: Larina Earthly, MD;  Location: [facility] OR;  Service: Vascular;  Laterality: Left;   IR RADIOLOGIST EVAL & MGMT  07/27/2018   IR US GUIDE BX ASP/DRAIN  07/14/2018   JOINT REPLACEMENT     both hips replaced    LUMBAR LAMINECTOMY/DECOMPRESSION MICRODISCECTOMY Right 10/17/2014   Procedure: LUMBAR LAMINECTOMY/DECOMPRESSION MICRODISCECTOMY 2 LEVELS;  Surgeon: Lisbeth Renshaw, MD;  Location: [facility];  Service: Neurosurgery;  Laterality: Right;  Right L45 L5S1 laminectomy and foraminotomy   MASS EXCISION  09/13/2012   Procedure: EXCISION MASS;  Surgeon: Dalia Heading, MD;  Location: [facility] ORS;  Service: General;  Laterality: N/A;   ROBOTIC ASSITED PARTIAL NEPHRECTOMY Left 03/30/2019   Procedure: XI ROBOTIC ASSITED PARTIAL NEPHRECTOMY POSSIBLE RADICAL NEPHRECTOMY;  Surgeon: Rene Paci, MD;  Location: [facility] ORS;  Service: Urology;  Laterality: Left;   SHOULDER ARTHROSCOPY WITH ROTATOR CUFF REPAIR Right 04/12/2020  Procedure: right shoulder arthroscopy, debridement, mini open rotator cuff tear repair;  Surgeon: Cammy Copa, MD;  Location: [facility] OR;  Service: Orthopedics;  Laterality: Right;   TONSILLECTOMY     TOTAL HIP ARTHROPLASTY Right 03/20/2015   TOTAL HIP ARTHROPLASTY Right 03/20/2015   Procedure: TOTAL HIP ARTHROPLASTY ANTERIOR APPROACH;  Surgeon: Sheral Apley, MD;  Location: [facility] OR;  Service: Orthopedics;  Laterality: Right;   TOTAL HIP ARTHROPLASTY Left 01/08/2016   Procedure: TOTAL HIP ARTHROPLASTY ANTERIOR APPROACH;  Surgeon: Sheral Apley, MD;  Location: [facility] OR;  Service: Orthopedics;  Laterality: Left;   Patient Active Problem List   Diagnosis Date Noted   Microcytic anemia 10/12/2023   Iron deficiency anemia due to chronic blood loss  10/12/2023   Nausea without vomiting 10/12/2023   Colon cancer screening 10/02/2023   Elevated PSA 10/02/2023   Aphasia as late effect of cerebrovascular accident (CVA) 10/01/2023   Erectile dysfunction 10/01/2023   Prostate cancer screening 10/01/2023   COPD (chronic obstructive pulmonary disease) (HCC) 05/28/2023   Mild sleep apnea 05/28/2023   Periodic limb movement 05/28/2023   Loud snoring 03/03/2023   Dyspnea 03/03/2023   Normocytic anemia 01/13/2023   CKD (chronic kidney disease), stage III (HCC) 01/13/2023   History of CVA (cerebrovascular accident) 01/13/2023   Chronic systolic heart failure (HCC)    History of renal cell carcinoma    Diabetes mellitus (HCC) 02/01/2017   Essential hypertension 02/01/2017   Primary osteoarthritis of left hip 01/08/2016   DJD (degenerative joint disease) 03/20/2015   Primary osteoarthritis of right hip 02/26/2015   Lumbar spondylosis 10/17/2014   Abnormality of gait 07/25/2014   TIA (transient ischemic attack) 03/09/2013   Cocaine abuse (HCC) 03/09/2013   Hyperlipidemia 03/09/2013   Current smoker 03/09/2013    ONSET DATE: 10/01/2023   REFERRING DIAG: Q46.962 (ICD-10-CM) - Aphasia as late effect of cerebrovascular accident (CVA)  THERAPY DIAG:  Dysarthria and anarthria  Cognitive communication deficit  Rationale for Evaluation and Treatment: Rehabilitation  SUBJECTIVE:   SUBJECTIVE STATEMENT: "Go fishing."  Pt accompanied by: self  PERTINENT HISTORY: Raymond Barrera is a 62 yo male who was referred for outpatient SLP evaluation and treatment by Dr. Patterson Hammersmith (PCP). Pt with past medical history of CVA with expressive aphasia, HTN, COPD, type II DM, CKD, RCC s/p nephrectomy, restless leg syndrome and tobacco abuse. CVA with residual expressive aphasia and dysarthria. He was seen for outpatient SLP therapy in 2022.  PAIN:  Are you having pain? No  PATIENT GOALS: Improve his ability to verbalize his thoughts  OBJECTIVE:   Note: Objective measures were completed at Evaluation unless otherwise noted.  DIAGNOSTIC FINDINGS:  MRI: Chronic infarct in the corona radiata on the left. Sequela of moderate chronic microvascular ischemic change. CT Head: Remote infarcts in the bilateral basal ganglia/corona radiata and left aspect of the corpus callosum and background chronic small-vessel ischemic change are noted.  PATIENT EDUCATION: Education details: Provided list of words to use for word finding activities at home Person educated: Patient and Spouse Education method: Explanation Education comprehension: verbalized understanding  Previous treatment:  Pt accompanied to therapy by his wife in a session that was added on for Pt. SLP reviewed fluency enhancing strategies of slowly exhaling and then verbalizing slowly. He participated in conversation with SLP with occasional delays in responses. His wife offered assistance with supplying words when he appeared to pause or look to her. SLP encouraged both to wait before jumping in to assist and let him  try to verbalize. He named items to description with 100% acc. He then was given a word and asked to describe by three salient features and completed with 90% acc. He was then shown a picture of an object and asked to describe in a barrier task with wife. He required additional cueing to describe by function, location, association, and what it looks like and completed with 90% acc with mi/mod assist. He was encouraged to continue practicing with list of words sent home.                                                                                                    TODAY'S TREATMENT: Pt was accompanied to therapy by his wife. He reported that he didn't feel well yesterday (dizzy) so he didn't go to church, but he's feeling better today. They reported practicing HEP given during previous session at home. In session, he completed verbal expression task that incorporated  reflections on the past year and goals for 2025. Speech intelligibility was mildly reduced today compared to previous session with ~85% intelligibility and SLP requesting clarifcation x3 in 10-minute conversation. He was able to answer open ended questions with 90% acc with min cues to expand. Pt with two requests for word finding assist in conversation. He is making good progress toward goals and he is requesting additional visits. Will see Pt for 4 more visits over the next month, once per week to continue targeting goals.  DATE: 11/30/23  GOALS: Goals reviewed with patient? Yes  SHORT TERM GOALS: Target date: 12/01/2023  Pt will increase conversation level speech intelligibility to 90% by utilizing appropriate strategies and listener needing repetition no more than 2x in a 10-minute conversation. Baseline: ~4x in 10 minutes Goal status: PARTIALLY MET/CONTINUE  2.  Pt will complete moderate level verbal expression tasks (object description, state function, provide short summary, etc) using short sentences with 90% acc and min assist. Baseline: mi/mod assist Goal status: PARTIALLY MET/CONTINUE  3.  Pt will implement word-finding strategies with 90% accuracy when unable to verbalize desired word in conversation/functional tasks with min assist. Baseline: mi/mod assist Goal status: PARTIALLY MET/CONTINUE  4.  Pt will answer open ended questions related to personally relevant information with 95% acc with min assist. Baseline: 90% Goal status: PARTIALLY MET/CONTINUE   ASSESSMENT:  CLINICAL IMPRESSION: (from initial evaluation 10/13/23) Patient is a 62 y.o. male who was seen today for a cognitive linguistic evaluation. He scored a 21/30 on the [facility] with errors in attention, memory, and executive functioning. He scored 11/15 on the [facility] test with greater difficulty with low frequency items (tripod, sphinx, palette). He provided appropriate content during picture  description task with mildly reduced speech intelligibility. When asked what he would like to work on, he indicated that he needs help "getting words out" and that he has more trouble speaking to groups of people. His wife indicates that he becomes frustrated and "gives up". Pt with mild imprecise articulation and difficulty with multisyllabic words with some hesitations observed. He described a typical day as: wake up, pray, eat,  get dressed, go outside, rake leaves, help his mom, and watch TV. Pt has a history of cognitive communication deficits from his previous strokes, but is motivated to participate in therapy again. Recommend short term SLP therapy to address the goals stated above, 1x per week for 4 weeks.  OBJECTIVE IMPAIRMENTS: include attention, executive functioning, and dysarthria. These impairments are limiting patient from effectively communicating at home and in community. Factors affecting potential to achieve goals and functional outcome are ability to learn/carryover information, previous level of function, and time post stroke onset . Patient will benefit from skilled SLP services to address above impairments and improve overall function.  REHAB POTENTIAL: Fair (time post stroke)  PLAN:  SLP FREQUENCY: 1x/week  SLP DURATION: 4 weeks  PLANNED INTERVENTIONS: [phone]- [address] Artic, Phon, Eval Compre, Express, [phone] Treatment of speech (30 or 45 min) , Re-evaluation, Cueing hierachy, Cognitive reorganization, and Internal/external aids   Managed Medicaid Authorization Request  Visit Dx Codes: [phone] (ICD-10-CM) - Aphasia as late effect of cerebrovascular accident (CVA), R47.1, R41.841  Functional Tool Score: N/A  For all possible CPT codes, reference the Planned Interventions line above.     Check all conditions that are expected to impact treatment: {Conditions expected to impact treatment:N/A   If treatment provided at initial evaluation, no treatment  charged due to lack of authorization.      Thank you,  Havery Moros, CCC-SLP [phone]  Marvelle Span, CCC-SLP 11/30/2023, 11:01 AM

## 2023-12-01 ENCOUNTER — Encounter: Payer: Self-pay | Admitting: Internal Medicine

## 2023-12-03 ENCOUNTER — Telehealth: Payer: Self-pay | Admitting: Primary Care

## 2023-12-03 ENCOUNTER — Ambulatory Visit: Payer: Self-pay | Admitting: Internal Medicine

## 2023-12-03 NOTE — Telephone Encounter (Signed)
 Copied from CRM [id number]. Topic: Clinical - Red Word Triage >> Dec 03, 2023 10:24 AM Tonda B wrote: [facility] that prompted transfer to Nurse Triage: pt dizziness confusion and body ache  Chief Complaint: confusion, dizziness, unsteady, body aches Symptoms: confusion, dizziness, unsteady on feet, bp medication changes.  Frequency: constant since beginning of November 2024 Pertinent Negatives: Patient denies fever Disposition: [x] ED /[] Urgent Care (no appt availability in office) / [] Appointment(In office/virtual)/ []  [facility]/ [] Home Care/ [] Refused Recommended Disposition /[] [facility]/ []  Follow-up with PCP Additional Notes: wife called for husband, has not been without symptoms since late October.  Wife instructed to take him to ER.  States he might not go.  Has a hx of CVA.  Reason for Disposition  [1] Weakness (i.e., paralysis, loss of muscle strength) of the face, arm / hand, or leg / foot on one side of the body AND [2] sudden onset AND [3] present now  (Exception: Bell's palsy suspected [i.e., weakness only on one side of the face, developing over hours to days, no other symptoms].)  Answer Assessment - Initial Assessment Questions 1. SYMPTOM: What is the main symptom you are concerned about? (e.g., weakness, numbness)     Dizziness and confusion, pain 2. ONSET: When did this start? (minutes, hours, days; while sleeping)     After thanksgiving or a little before 3. LAST NORMAL: When was the last time you (the patient) were normal (no symptoms)?     Beginning Nov. 4. PATTERN Does this come and go, or has it been constant since it started?  Is it present now?     constant 5. CARDIAC SYMPTOMS: Have you had any of the following symptoms: chest pain, difficulty breathing, palpitations?     Difficulty breathing but has stopped.  6. NEUROLOGIC SYMPTOMS: Have you had any of the following symptoms: headache, dizziness, vision loss, double vision, changes  in speech, unsteady on your feet?     Double vision, unsteady on feet,  7. OTHER SYMPTOMS: Do you have any other symptoms?     BP high  Protocols used: Neurologic Deficit-A-AH

## 2023-12-03 NOTE — Telephone Encounter (Signed)
 Lm x1 for pt's spouse, [name](DPR)

## 2023-12-03 NOTE — Telephone Encounter (Signed)
 [name] wife states patient taking 2 Ropinirole at night. About to run out of medication. Also needs refill for Anoro. States Anoro causes dizziness. Pharmacy is [facility] [place]. [name] phone number is [phone].

## 2023-12-04 MED ORDER — ANORO ELLIPTA 62.5-25 MCG/ACT IN AEPB: 1.0000 | INHALATION_SPRAY | Freq: Every day | RESPIRATORY_TRACT | 3 refills | Status: DC

## 2023-12-04 NOTE — Telephone Encounter (Signed)
 Called and spoke with pt, Anoro has been refilled, Requip was sent by PCP  in [place]. Pt has been notified about this nfn

## 2023-12-07 NOTE — Progress Notes (Deleted)
 Cardiology Office Note:    Date:  12/07/2023  ID:  Raymond Barrera., DOB 03/21/1961, MRN [id number] PCP: [name], MD  [facility] Providers Cardiologist:  [name], MD { Click to update primary MD,subspecialty MD or APP then REFRESH:1}    {Click to Open Review  :1}   Patient Profile:      Raymond Barrera. Raymond Barrera. is a 63 year old male with visit pertinent history of HTN, T2DM, HLD, ischemic stroke s/p complex lumbar spine surgery, moderate CKD following nephrectomy for renal cell carcinoma in 2020.  In 2018 he had a left internal capsule ischemic stroke and he has mild right hand paresis as well as some difficulty with speech and poor short-term memory.  Prior to that in 2014 he had multiple mini strokes in the setting of severe hypertension.  Echocardiogram July 2019 LVEF 20-25% with severe diffuse hypokinesis and grade 1 DD. Lexiscan  Myoview  July 2019 with EF 24%, no ST segment deviation, no T wave inversion, high risk study.   He does have history of ongoing cocaine abuse.  Most recent UDS February, March, December 2024 all positive for cocaine.  He had a TIA in February 2024.  His TIA was in the setting of cocaine induced hypertensive urgency.  He presented with weakness to the right hand and worsening of his speech.  MRI/MRA of head did not show evidence of acute stroke or LVO but several abnormalities of the intracranial circulation were identified. 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  During this hospitalization he had echocardiogram that showed worsening of his LV systolic function with an EF of 30-35%, global hypokinesis, mild LVH, no significant valvular abnormality seen.  Ascending aorta was mildly dilated at 41 mm.  Last seen by Dr. Balding in March 2024.   Patient was doing well at the time with NYHA functional class I.  He was on Jardiance , ramipril  as GDMT.  Not on aldosterone antagonist or Entresto  due to renal dysfunction.  It was thought that his varying degrees of blood pressure and use of cocaine was likely the reason of the wide variation in his ejection fraction over the past year.  Event monitor was ordered given recent TIA with history of CVA in his heart monitor was completed on 02/17/2023 showing presence of occasional monomorphic PVCs representing 2% of all recorded beats.  Symptom triggered recordings generally correlate with periods of frequent PVCs.  There was no atrial fibrillation identified.  The PVCs were more active during the daytime and generally diminished in frequency at night.  He was started on carvedilol  3.125 twice daily.      History of Present Illness:  Discussed the use of AI scribe software for clinical note transcription with the patient, who gave verbal consent to proceed.  Raymond Barrera. is a 63 y.o. male who returns for ***Discussed the use of AI scribe software for clinical note transcription with the patient, who gave verbal consent to proceed.  History of Present Illness            ROS   See HPI ***    Studies Reviewed:       *** Risk Assessment/Calculations:   {Does this patient have ATRIAL FIBRILLATION?[phone]} No BP recorded.  {Refresh Note OR Click here to enter BP  :1}***       Physical  Exam:   VS:  There were no vitals taken for this visit.   Wt Readings from Last 3 Encounters:  11/03/23 243 lb 9.7 oz (110.5 kg)  10/12/23 243 lb 11.2 oz (110.5 kg)  10/01/23 245 lb 9.6 oz (111.4 kg)    Physical Exam***     Assessment and Plan:  Assessment and Plan              {Are you ordering a CV Procedure (e.g. stress test, cath, DCCV, TEE, etc)?   Press F2        :[phone]}  Dispo:  No follow-ups on file.  Signed, Lum Barrera Louis, NP

## 2023-12-08 ENCOUNTER — Ambulatory Visit: Payer: MEDICAID | Attending: Nurse Practitioner | Admitting: Nurse Practitioner

## 2023-12-08 ENCOUNTER — Other Ambulatory Visit: Payer: Self-pay | Admitting: Internal Medicine

## 2023-12-08 DIAGNOSIS — I10 Essential (primary) hypertension: Secondary | ICD-10-CM

## 2023-12-10 ENCOUNTER — Ambulatory Visit (HOSPITAL_COMMUNITY): Payer: MEDICAID | Attending: Internal Medicine | Admitting: Speech Pathology

## 2023-12-10 DIAGNOSIS — R41841 Cognitive communication deficit: Secondary | ICD-10-CM | POA: Diagnosis present

## 2023-12-10 DIAGNOSIS — R471 Dysarthria and anarthria: Secondary | ICD-10-CM | POA: Diagnosis present

## 2023-12-10 NOTE — Therapy (Signed)
 OUTPATIENT SPEECH LANGUAGE PATHOLOGY TREATMENT   Patient Name: Raymond Barrera. MRN: [id number] DOB:02/08/1961, 63 y.o., male 15 Date: 12/10/2023  PCP: Raymond Suzzane POUR, MD REFERRING PROVIDER: [name], MD  END OF SESSION:  End of Session - 12/10/23 0912     Visit Number 5    Number of Visits 8    Date for SLP Re-Evaluation 12/31/23    Authorization Type [employer]    SLP Start Time [phone]    SLP Stop Time  0935    SLP Time Calculation (min) 45 min    Activity Tolerance Patient tolerated treatment well             Past Medical History:  Diagnosis Date   Ambulates with cane    straight - uses occasionally   Anxiety    due to the stroke   Arthritis    Back pain    hx of buldging disc   Complication of anesthesia    took a while for him to wake up after previous anesthesia   CVA (cerebral vascular accident) (HCC) 10/07/2020   Diabetes mellitus without complication (HCC)    Family history of adverse reaction to anesthesia    sometimes mom has a hard time waking up   High cholesterol    takes Zocor  daily   Hypertension    takes Benazepril  and HCTZ  daily   Joint pain    Joint swelling    Memory impairment    occassional - from stroke   Myocardial infarction (HCC) 1987   Pneumonia    hx of-80's   Shortness of breath dyspnea    do to pain   Sleep apnea    never had a sleep study,but states Dr. Rennis says he has it   Slurred speech    Stroke (HCC) 08/2013   7 mini-strokes, last stroke 2017   TIA (transient ischemic attack) 2014   x 7    Urinary frequency    Past Surgical History:  Procedure Laterality Date   ABDOMINAL EXPOSURE N/A 06/16/2018   Procedure: ABDOMINAL EXPOSURE;  Surgeon: Oris Krystal FALCON, MD;  Location: [facility] OR;  Service: Vascular;  Laterality: N/A;   ANKLE SURGERY  2008   left ankle-otif-[facility]   ANTERIOR LUMBAR FUSION Bilateral 06/16/2018   Procedure: LUMBAR 4-5 LUMBAR 5-SACRUM 1 ANTERIOR LUMBAR INTERBODY FUSION WITH  INSTRUMENTATION AND ALLOGRAFT;  Surgeon: Beuford Anes, MD;  Location: [facility] OR;  Service: Orthopedics;  Laterality: Bilateral;   BACK SURGERY     HEMATOMA EVACUATION Left 08/13/2018   Procedure: EVACUATION HEMATOMA LEFT ABDOMINAL WALL;  Surgeon: Oris Krystal FALCON, MD;  Location: [facility] OR;  Service: Vascular;  Laterality: Left;   IR RADIOLOGIST EVAL & MGMT  07/27/2018   IR US  GUIDE BX ASP/DRAIN  07/14/2018   JOINT REPLACEMENT     both hips replaced    LUMBAR LAMINECTOMY/DECOMPRESSION MICRODISCECTOMY Right 10/17/2014   Procedure: LUMBAR LAMINECTOMY/DECOMPRESSION MICRODISCECTOMY 2 LEVELS;  Surgeon: Gerldine Maizes, MD;  Location: [facility] ORS;  Service: Neurosurgery;  Laterality: Right;  Right L45 L5S1 laminectomy and foraminotomy   MASS EXCISION  09/13/2012   Procedure: EXCISION MASS;  Surgeon: Anes DELENA Budge, MD;  Location: [facility] ORS;  Service: General;  Laterality: N/A;   ROBOTIC ASSITED PARTIAL NEPHRECTOMY Left 03/30/2019   Procedure: XI ROBOTIC ASSITED PARTIAL NEPHRECTOMY POSSIBLE RADICAL NEPHRECTOMY;  Surgeon: Devere Lonni Righter, MD;  Location: [facility] ORS;  Service: Urology;  Laterality: Left;   SHOULDER ARTHROSCOPY WITH ROTATOR CUFF REPAIR Right 04/12/2020  Procedure: right shoulder arthroscopy, debridement, mini open rotator cuff tear repair;  Surgeon: Addie Cordella Hamilton, MD;  Location: [facility] OR;  Service: Orthopedics;  Laterality: Right;   TONSILLECTOMY     TOTAL HIP ARTHROPLASTY Right 03/20/2015   TOTAL HIP ARTHROPLASTY Right 03/20/2015   Procedure: TOTAL HIP ARTHROPLASTY ANTERIOR APPROACH;  Surgeon: Evalene JONETTA Chancy, MD;  Location: [facility] OR;  Service: Orthopedics;  Laterality: Right;   TOTAL HIP ARTHROPLASTY Left 01/08/2016   Procedure: TOTAL HIP ARTHROPLASTY ANTERIOR APPROACH;  Surgeon: Evalene JONETTA Chancy, MD;  Location: [facility] OR;  Service: Orthopedics;  Laterality: Left;   Patient Active Problem List   Diagnosis Date Noted   Microcytic anemia 10/12/2023   Iron  deficiency anemia due to chronic blood loss  10/12/2023   Nausea without vomiting 10/12/2023   Colon cancer screening 10/02/2023   Elevated PSA 10/02/2023   Aphasia as late effect of cerebrovascular accident (CVA) 10/01/2023   Erectile dysfunction 10/01/2023   Prostate cancer screening 10/01/2023   COPD (chronic obstructive pulmonary disease) (HCC) 05/28/2023   Mild sleep apnea 05/28/2023   Periodic limb movement 05/28/2023   Loud snoring 03/03/2023   Dyspnea 03/03/2023   Normocytic anemia 01/13/2023   CKD (chronic kidney disease), stage III (HCC) 01/13/2023   History of CVA (cerebrovascular accident) 01/13/2023   Chronic systolic heart failure (HCC)    History of renal cell carcinoma    Diabetes mellitus (HCC) 02/01/2017   Essential hypertension 02/01/2017   Primary osteoarthritis of left hip 01/08/2016   DJD (degenerative joint disease) 03/20/2015   Primary osteoarthritis of right hip 02/26/2015   Lumbar spondylosis 10/17/2014   Abnormality of gait 07/25/2014   TIA (transient ischemic attack) 03/09/2013   Cocaine abuse (HCC) 03/09/2013   Hyperlipidemia 03/09/2013   Current smoker 03/09/2013    ONSET DATE: 10/01/2023   REFERRING DIAG: P30.679 (ICD-10-CM) - Aphasia as late effect of cerebrovascular accident (CVA)  THERAPY DIAG:  Dysarthria and anarthria  Cognitive communication deficit  Rationale for Evaluation and Treatment: Rehabilitation  SUBJECTIVE:   SUBJECTIVE STATEMENT: I've been in a bad mood because things aren't going the way I want.  Pt accompanied by: self  PERTINENT HISTORY: Raymond Barrera is a 63 yo male who was referred for outpatient SLP evaluation and treatment by Dr. Donneta Barrera (PCP). Pt with past medical history of CVA with expressive aphasia, HTN, COPD, type II DM, CKD, RCC s/p nephrectomy, restless leg syndrome and tobacco abuse. CVA with residual expressive aphasia and dysarthria. He was seen for outpatient SLP therapy in 2022.  PAIN:  Are you having pain? No  PATIENT GOALS: Improve  his ability to verbalize his thoughts  OBJECTIVE:  Note: Objective measures were completed at Evaluation unless otherwise noted.  DIAGNOSTIC FINDINGS:  MRI: Chronic infarct in the corona radiata on the left. Sequela of moderate chronic microvascular ischemic change. CT Head: Remote infarcts in the bilateral basal ganglia/corona radiata and left aspect of the corpus callosum and background chronic small-vessel ischemic change are noted.  PATIENT EDUCATION: Education details: Provided list of words to use for word finding activities at home Person educated: Patient and Spouse Education method: Explanation Education comprehension: verbalized understanding  Previous treatment:  Pt was accompanied to therapy by his wife. He reported that he didn't feel well yesterday (dizzy) so he didn't go to church, but he's feeling better today. They reported practicing HEP given during previous session at home. In session, he completed verbal expression task that incorporated reflections on the past year and goals for 2025.  Speech intelligibility was mildly reduced today compared to previous session with ~85% intelligibility and SLP requesting clarifcation x3 in 10-minute conversation. He was able to answer open ended questions with 90% acc with min cues to expand. Pt with two requests for word finding assist in conversation. He is making good progress toward goals and he is requesting additional visits. Will see Pt for 4 more visits over the next month, once per week to continue targeting goals.                                                                                                     TODAY'S TREATMENT: Pt accompanied to therapy by his wife. He indicated that he has not been in a good mood lately and became quite frustrated at [facility] when trying to communicate to an employee. He presented with increased laryngeal tension today and was speaking upon inhalation which produced a strangled quality.  This was present a couple of years ago in SLP sessions and was difficult to improve. He became more dysfluent and had difficulty verbalizing his thoughts when describing what occurred at [facility]. SLP suggested that he verbalize to new conversation partners that he has had a stroke and it is difficult for him to speak. We practiced having him say this several times in the session. [name] interjected frequently throughout the session to help and SLP gently encouraged time for him to communicate. SLP requested clarification more often during conversation this session, however intelligibility improved by the end of the session. He utilized word finding strategies during situations when he had difficulty thinking of the word with mi/mod cues. Continue to target goals below. DATE: 12/10/23  GOALS: Goals reviewed with patient? Yes  SHORT TERM GOALS: Target date: 12/01/2023  Pt will increase conversation level speech intelligibility to 90% by utilizing appropriate strategies and listener needing repetition no more than 2x in a 10-minute conversation. Baseline: ~4x in 10 minutes Goal status: PARTIALLY MET/CONTINUE  2.  Pt will complete moderate level verbal expression tasks (object description, state function, provide short summary, etc) using short sentences with 90% acc and min assist. Baseline: mi/mod assist Goal status: PARTIALLY MET/CONTINUE  3.  Pt will implement word-finding strategies with 90% accuracy when unable to verbalize desired word in conversation/functional tasks with min assist. Baseline: mi/mod assist Goal status: PARTIALLY MET/CONTINUE  4.  Pt will answer open ended questions related to personally relevant information with 95% acc with min assist. Baseline: 90% Goal status: PARTIALLY MET/CONTINUE   ASSESSMENT:  CLINICAL IMPRESSION: (from initial evaluation 10/13/23) Patient is a 63 y.o. male who was seen today for a cognitive linguistic evaluation. He scored a 21/30 on the  [facility] with errors in attention, memory, and executive functioning. He scored 11/15 on the [facility] test with greater difficulty with low frequency items (tripod, sphinx, palette). He provided appropriate content during picture description task with mildly reduced speech intelligibility. When asked what he would like to work on, he indicated that he needs help getting words out and that he has more trouble speaking to groups of people.  His wife indicates that he becomes frustrated and gives up. Pt with mild imprecise articulation and difficulty with multisyllabic words with some hesitations observed. He described a typical day as: wake up, pray, eat, get dressed, go outside, rake leaves, help his mom, and watch TV. Pt has a history of cognitive communication deficits from his previous strokes, but is motivated to participate in therapy again. Recommend short term SLP therapy to address the goals stated above, 1x per week for 4 weeks.  OBJECTIVE IMPAIRMENTS: include attention, executive functioning, and dysarthria. These impairments are limiting patient from effectively communicating at home and in community. Factors affecting potential to achieve goals and functional outcome are ability to learn/carryover information, previous level of function, and time post stroke onset . Patient will benefit from skilled SLP services to address above impairments and improve overall function.  REHAB POTENTIAL: Fair (time post stroke)  PLAN:  SLP FREQUENCY: 1x/week  SLP DURATION: 4 weeks  PLANNED INTERVENTIONS: [phone]- [phone], Artic, Phon, Eval Compre, Express, [phone] Treatment of speech (30 or 45 min) , Re-evaluation, Cueing hierachy, Cognitive reorganization, and Internal/external aids   Managed Medicaid Authorization Request  Visit Dx Codes: [phone] (ICD-10-CM) - Aphasia as late effect of cerebrovascular accident (CVA), R47.1, R41.841  Functional Tool Score: N/A  For all  possible CPT codes, reference the Planned Interventions line above.     Check all conditions that are expected to impact treatment: {Conditions expected to impact treatment:N/A   If treatment provided at initial evaluation, no treatment charged due to lack of authorization.      Thank you,  Lamar Candy, CCC-SLP [phone]  Johnavon Mcclafferty, CCC-SLP 12/10/2023, 9:17 AM

## 2023-12-16 ENCOUNTER — Encounter (HOSPITAL_COMMUNITY): Payer: MEDICAID | Admitting: Speech Pathology

## 2023-12-21 ENCOUNTER — Encounter (HOSPITAL_COMMUNITY): Payer: MEDICAID | Admitting: Speech Pathology

## 2023-12-28 ENCOUNTER — Encounter (HOSPITAL_COMMUNITY): Payer: Self-pay | Admitting: Speech Pathology

## 2023-12-28 ENCOUNTER — Ambulatory Visit (HOSPITAL_COMMUNITY): Payer: MEDICAID | Admitting: Speech Pathology

## 2023-12-28 DIAGNOSIS — R471 Dysarthria and anarthria: Secondary | ICD-10-CM | POA: Diagnosis not present

## 2023-12-28 DIAGNOSIS — R41841 Cognitive communication deficit: Secondary | ICD-10-CM

## 2023-12-28 NOTE — Therapy (Signed)
OUTPATIENT SPEECH LANGUAGE PATHOLOGY TREATMENT   Patient Name: Raymond Barrera. MRN: [id number] DOB:13-Jan-1961, 63 y.o., male 56 Date: 12/28/2023  PCP: Raymond Halon, MD REFERRING PROVIDER: Anabel Halon, MD  END OF SESSION:  End of Session - 12/28/23 0947     Visit Number 6    Number of Visits 8    Date for SLP Re-Evaluation 12/31/23    Authorization Type [employer]    SLP Start Time [phone]    SLP Stop Time  1024    SLP Time Calculation (min) 45 min    Activity Tolerance Patient tolerated treatment well             Past Medical History:  Diagnosis Date   Ambulates with cane    straight - uses occasionally   Anxiety    due to the stroke   Arthritis    Back pain    hx of buldging disc   Complication of anesthesia    took a while for him to wake up after previous anesthesia   CVA (cerebral vascular accident) (HCC) 10/07/2020   Diabetes mellitus without complication (HCC)    Family history of adverse reaction to anesthesia    "sometimes mom has a hard time waking up"   High cholesterol    takes Zocor daily   Hypertension    takes Benazepril and HCTZ  daily   Joint pain    Joint swelling    Memory impairment    occassional - from stroke   Myocardial infarction (HCC) 1987   Pneumonia    hx of-80's   Shortness of breath dyspnea    do to pain   Sleep apnea    never had a sleep study,but states Dr. Janna Barrera says he has it   Slurred speech    Stroke (HCC) 08/2013   7 mini-strokes, last stroke 2017   TIA (transient ischemic attack) 2014   x 7    Urinary frequency    Past Surgical History:  Procedure Laterality Date   ABDOMINAL EXPOSURE N/A 06/16/2018   Procedure: ABDOMINAL EXPOSURE;  Surgeon: Larina Earthly, MD;  Location: [facility] OR;  Service: Vascular;  Laterality: N/A;   ANKLE SURGERY  2008   left ankle-otif-[facility]   ANTERIOR LUMBAR FUSION Bilateral 06/16/2018   Procedure: LUMBAR 4-5 LUMBAR 5-SACRUM 1 ANTERIOR LUMBAR INTERBODY FUSION WITH  INSTRUMENTATION AND ALLOGRAFT;  Surgeon: Estill Bamberg, MD;  Location: [facility] OR;  Service: Orthopedics;  Laterality: Bilateral;   BACK SURGERY     HEMATOMA EVACUATION Left 08/13/2018   Procedure: EVACUATION HEMATOMA LEFT ABDOMINAL WALL;  Surgeon: Larina Earthly, MD;  Location: [facility] OR;  Service: Vascular;  Laterality: Left;   IR RADIOLOGIST EVAL & MGMT  07/27/2018   IR US GUIDE BX ASP/DRAIN  07/14/2018   JOINT REPLACEMENT     both hips replaced    LUMBAR LAMINECTOMY/DECOMPRESSION MICRODISCECTOMY Right 10/17/2014   Procedure: LUMBAR LAMINECTOMY/DECOMPRESSION MICRODISCECTOMY 2 LEVELS;  Surgeon: Lisbeth Renshaw, MD;  Location: [facility];  Service: Neurosurgery;  Laterality: Right;  Right L45 L5S1 laminectomy and foraminotomy   MASS EXCISION  09/13/2012   Procedure: EXCISION MASS;  Surgeon: Dalia Heading, MD;  Location: [facility];  Service: General;  Laterality: N/A;   ROBOTIC ASSITED PARTIAL NEPHRECTOMY Left 03/30/2019   Procedure: XI ROBOTIC ASSITED PARTIAL NEPHRECTOMY POSSIBLE RADICAL NEPHRECTOMY;  Surgeon: Rene Paci, MD;  Location: [facility] OR[facility];  Service: Urology;  Laterality: Left;   SHOULDER ARTHROSCOPY WITH ROTATOR CUFF REPAIR Right 04/12/2020  Procedure: right shoulder arthroscopy, debridement, mini open rotator cuff tear repair;  Surgeon: Cammy Copa, MD;  Location: [facility] OR;  Service: Orthopedics;  Laterality: Right;   TONSILLECTOMY     TOTAL HIP ARTHROPLASTY Right 03/20/2015   TOTAL HIP ARTHROPLASTY Right 03/20/2015   Procedure: TOTAL HIP ARTHROPLASTY ANTERIOR APPROACH;  Surgeon: Sheral Apley, MD;  Location: [facility] OR;  Service: Orthopedics;  Laterality: Right;   TOTAL HIP ARTHROPLASTY Left 01/08/2016   Procedure: TOTAL HIP ARTHROPLASTY ANTERIOR APPROACH;  Surgeon: Sheral Apley, MD;  Location: [facility] OR;  Service: Orthopedics;  Laterality: Left;   Patient Active Problem List   Diagnosis Date Noted   Microcytic anemia 10/12/2023   Iron deficiency anemia due to chronic blood loss  10/12/2023   Nausea without vomiting 10/12/2023   Colon cancer screening 10/02/2023   Elevated PSA 10/02/2023   Aphasia as late effect of cerebrovascular accident (CVA) 10/01/2023   Erectile dysfunction 10/01/2023   Prostate cancer screening 10/01/2023   COPD (chronic obstructive pulmonary disease) (HCC) 05/28/2023   Mild sleep apnea 05/28/2023   Periodic limb movement 05/28/2023   Loud snoring 03/03/2023   Dyspnea 03/03/2023   Normocytic anemia 01/13/2023   CKD (chronic kidney disease), stage III (HCC) 01/13/2023   History of CVA (cerebrovascular accident) 01/13/2023   Chronic systolic heart failure (HCC)    History of renal cell carcinoma    Diabetes mellitus (HCC) 02/01/2017   Essential hypertension 02/01/2017   Primary osteoarthritis of left hip 01/08/2016   DJD (degenerative joint disease) 03/20/2015   Primary osteoarthritis of right hip 02/26/2015   Lumbar spondylosis 10/17/2014   Abnormality of gait 07/25/2014   TIA (transient ischemic attack) 03/09/2013   Cocaine abuse (HCC) 03/09/2013   Hyperlipidemia 03/09/2013   Current smoker 03/09/2013    ONSET DATE: 10/01/2023   REFERRING DIAG: Z61.096 (ICD-10-CM) - Aphasia as late effect of cerebrovascular accident (CVA)  THERAPY DIAG:  Dysarthria and anarthria  Cognitive communication deficit  Rationale for Evaluation and Treatment: Rehabilitation  SUBJECTIVE:   SUBJECTIVE STATEMENT: "I haven't been feeling well."  Pt accompanied by: self  PERTINENT HISTORY: Raymond Barrera is a 63 yo male who was referred for outpatient SLP evaluation and treatment by Dr. Patterson Barrera (PCP). Pt with past medical history of CVA with expressive aphasia, HTN, COPD, type II DM, CKD, RCC s/p nephrectomy, restless leg syndrome and tobacco abuse. CVA with residual expressive aphasia and dysarthria. He was seen for outpatient SLP therapy in 2022.  PAIN:  Are you having pain? No  PATIENT GOALS: Improve his ability to verbalize his  thoughts  OBJECTIVE:  Note: Objective measures were completed at Evaluation unless otherwise noted.  DIAGNOSTIC FINDINGS:  MRI: Chronic infarct in the corona radiata on the left. Sequela of moderate chronic microvascular ischemic change. CT Head: Remote infarcts in the bilateral basal ganglia/corona radiata and left aspect of the corpus callosum and background chronic small-vessel ischemic change are noted.  PATIENT EDUCATION: Education details: Encouraged them to purchase picture flash cards from dollar store to practice word description tasks at home Person educated: Patient and Spouse Education method: Explanation Education comprehension: verbalized understanding  Previous treatment:   Pt accompanied to therapy by his wife. He indicated that he has not been in a good mood lately and became quite frustrated at [facility] when trying to communicate to an employee. He presented with increased laryngeal tension today and was speaking upon inhalation which produced a "strangled" quality. This was present a couple of years ago in SLP  sessions and was difficult to improve. He became more dysfluent and had difficulty verbalizing his thoughts when describing what occurred at [facility]. SLP suggested that he verbalize to new conversation partners that he has had a stroke and it is difficult for him to speak. We practiced having him say this several times in the session. [name] interjected frequently throughout the session to "help" and SLP gently encouraged time for him to communicate. SLP requested clarification more often during conversation this session, however intelligibility improved by the end of the session. He utilized word finding strategies during situations when he had difficulty thinking of the word with mi/mod cues. Continue to target goals below.                                                                                                    TODAY'S TREATMENT: Pt accompanied to therapy  by his wife. He and his wife indicated they have been sick and have an appointment with his PCP. SLP encouraged them to cancel/reschedule our SLP sessions if they are sick. Pt was seen today and all parties wore a mask. SLP reviewed word finding and word description strategies and then targeted throughout the session. Pt challenged to provide the most salient features during object description tasks. He tends to describe by what he can see, for example, he described a rattle as "round, yellow, blue". He needed cues to state the most important features (baby, noise, shake). He completed the task with 90% acc when mi/mod cues provided. He named to description with 95% acc. SLP then added a timed "stressor" to further challenge him and this appeared to only impact him when he had to provide the description, not when guessing the word. SLP had to request repetition for speech intelligibility about 20% of the time during structured barrier tasks. Attendance has been intermittent due to Pt illness and transportation difficulties. Will schedule for 2 more SLP sessions in preparation for discharge. Continue to target goals. Pt and spouse are in agreement with plan of care.  DATE: 12/28/23  GOALS: Goals reviewed with patient? Yes  SHORT TERM GOALS: Target date: 01/28/2024  Pt will increase conversation level speech intelligibility to 90% by utilizing appropriate strategies and listener needing repetition no more than 2x in a 10-minute conversation. Baseline: ~4x in 10 minutes Goal status: PARTIALLY MET/CONTINUE  2.  Pt will complete moderate level verbal expression tasks (object description, state function, provide short summary, etc) using short sentences with 90% acc and min assist. Baseline: mi/mod assist Goal status: PARTIALLY MET/CONTINUE  3.  Pt will implement word-finding strategies with 90% accuracy when unable to verbalize desired word in conversation/functional tasks with min assist. Baseline:  mi/mod assist Goal status: PARTIALLY MET/CONTINUE  4.  Pt will answer open ended questions related to personally relevant information with 95% acc with min assist. Baseline: 90% Goal status: PARTIALLY MET/CONTINUE   ASSESSMENT:  CLINICAL IMPRESSION: (from initial evaluation 10/13/23) Patient is a 63 y.o. male who was seen today for a cognitive linguistic evaluation. He scored a 21/30 on the [facility] with errors in attention,  memory, and executive functioning. He scored 11/15 on the [facility] test with greater difficulty with low frequency items (tripod, sphinx, palette). He provided appropriate content during picture description task with mildly reduced speech intelligibility. When asked what he would like to work on, he indicated that he needs help "getting words out" and that he has more trouble speaking to groups of people. His wife indicates that he becomes frustrated and "gives up". Pt with mild imprecise articulation and difficulty with multisyllabic words with some hesitations observed. He described a typical day as: wake up, pray, eat, get dressed, go outside, rake leaves, help his mom, and watch TV. Pt has a history of cognitive communication deficits from his previous strokes, but is motivated to participate in therapy again. Recommend short term SLP therapy to address the goals stated above, 1x per week for 4 weeks.  OBJECTIVE IMPAIRMENTS: include attention, executive functioning, and dysarthria. These impairments are limiting patient from effectively communicating at home and in community. Factors affecting potential to achieve goals and functional outcome are ability to learn/carryover information, previous level of function, and time post stroke onset . Patient will benefit from skilled SLP services to address above impairments and improve overall function.  REHAB POTENTIAL: Fair (time post stroke)  PLAN:  SLP FREQUENCY: 1x/week  SLP DURATION: 2 weeks  PLANNED  INTERVENTIONS: [phone]- [address], [place], Phon, Eval Compre, Express, [phone] Treatment of speech (30 or 45 min) , Re-evaluation, Cueing hierachy, Cognitive reorganization, and Internal/external aids   Managed Medicaid Authorization Request  Visit Dx Codes: [phone] (ICD-10-CM) - Aphasia as late effect of cerebrovascular accident (CVA), R47.1, R41.841  Functional Tool Score: N/A  For all possible CPT codes, reference the Planned Interventions line above.     Check all conditions that are expected to impact treatment: {Conditions expected to impact treatment:N/A   If treatment provided at initial evaluation, no treatment charged due to lack of authorization.      Thank you,  Havery Moros, CCC-SLP [phone]  Kavina Cantave, CCC-SLP 12/28/2023, 9:48 AM

## 2024-01-01 ENCOUNTER — Encounter: Payer: Self-pay | Admitting: Internal Medicine

## 2024-01-01 ENCOUNTER — Ambulatory Visit (INDEPENDENT_AMBULATORY_CARE_PROVIDER_SITE_OTHER): Payer: MEDICAID | Admitting: Internal Medicine

## 2024-01-01 VITALS — BP 126/80 | HR 86 | Ht 78.0 in | Wt 241.0 lb

## 2024-01-01 DIAGNOSIS — N1832 Chronic kidney disease, stage 3b: Secondary | ICD-10-CM | POA: Diagnosis not present

## 2024-01-01 DIAGNOSIS — E1159 Type 2 diabetes mellitus with other circulatory complications: Secondary | ICD-10-CM

## 2024-01-01 DIAGNOSIS — Z7984 Long term (current) use of oral hypoglycemic drugs: Secondary | ICD-10-CM

## 2024-01-01 DIAGNOSIS — Z23 Encounter for immunization: Secondary | ICD-10-CM | POA: Diagnosis not present

## 2024-01-01 DIAGNOSIS — J449 Chronic obstructive pulmonary disease, unspecified: Secondary | ICD-10-CM

## 2024-01-01 DIAGNOSIS — D500 Iron deficiency anemia secondary to blood loss (chronic): Secondary | ICD-10-CM

## 2024-01-01 DIAGNOSIS — Z8673 Personal history of transient ischemic attack (TIA), and cerebral infarction without residual deficits: Secondary | ICD-10-CM

## 2024-01-01 DIAGNOSIS — E782 Mixed hyperlipidemia: Secondary | ICD-10-CM

## 2024-01-01 DIAGNOSIS — I69320 Aphasia following cerebral infarction: Secondary | ICD-10-CM

## 2024-01-01 DIAGNOSIS — G4761 Periodic limb movement disorder: Secondary | ICD-10-CM

## 2024-01-01 DIAGNOSIS — I10 Essential (primary) hypertension: Secondary | ICD-10-CM | POA: Diagnosis not present

## 2024-01-01 DIAGNOSIS — M47816 Spondylosis without myelopathy or radiculopathy, lumbar region: Secondary | ICD-10-CM

## 2024-01-01 DIAGNOSIS — E1169 Type 2 diabetes mellitus with other specified complication: Secondary | ICD-10-CM

## 2024-01-01 MED ORDER — CLOPIDOGREL BISULFATE 75 MG PO TABS: 75.0000 mg | ORAL_TABLET | Freq: Every day | ORAL | 11 refills | Status: DC

## 2024-01-01 MED ORDER — JARDIANCE 10 MG PO TABS: 10.0000 mg | ORAL_TABLET | Freq: Every day | ORAL | 3 refills | Status: DC

## 2024-01-01 MED ORDER — ATORVASTATIN CALCIUM 40 MG PO TABS: 40.0000 mg | ORAL_TABLET | Freq: Every day | ORAL | 1 refills | Status: DC

## 2024-01-01 MED ORDER — ROPINIROLE HCL 1 MG PO TABS: 1.0000 mg | ORAL_TABLET | Freq: Every day | ORAL | 1 refills | Status: DC

## 2024-01-01 MED ORDER — AMLODIPINE BESYLATE 5 MG PO TABS: 5.0000 mg | ORAL_TABLET | Freq: Every day | ORAL | 5 refills | Status: DC

## 2024-01-01 MED ORDER — GABAPENTIN 100 MG PO CAPS: 100.0000 mg | ORAL_CAPSULE | Freq: Two times a day (BID) | ORAL | 3 refills | Status: DC

## 2024-01-01 NOTE — Assessment & Plan Note (Addendum)
Hb: ~8 Likely due to CKD Needs GI evaluation for ruling out occult GI bleeding - needs to refrain from using substances to get EGD and colonoscopy scheduled Check CBC, iron profile, folic acid, B12 Needs to start iron supplement

## 2024-01-01 NOTE — Progress Notes (Signed)
New Patient Office Visit  Subjective:  Patient ID: Raymond Paras., male    DOB: [birth year]  Age: 63 y.o. MRN: [id number]  CC:  Chief Complaint  Patient presents with   Care Management    Follow up , reports dizziness, instability ,confusion, and pain, needs refills.    Hypertension   COPD   Diabetes    HPI Raymond Elgersma. is a 63 y.o. male with past medical history of CVA with expressive aphasia, HTN, COPD, type II DM, CKD, RCC s/p nephrectomy, restless leg syndrome and tobacco abuse who presents for f/u of his chronic medical conditions.  CVA with residual expressive aphasia: Chart review suggests history of multiple CVAs in the past, last in 02/24.  He has seen neurologist for it.  He is supposed to take Plavix and statin, but has run out of them again - he has adequate refills at pharmacy but his wife states that they can't afford to buy his medicines as they had to stay at different places and had other expenses. I had discussion about importance of compliance to his medications to avoid recurrent CVA.  He has chronic right sided weakness as well.  Due to his expressive aphasia, he feels agitated at times due to delayed speech- his speech has improved with ST.  HTN: BP is well-controlled with amlodipine 5 mg QD.  He has stopped taking ramipril 10 mg once daily due to dizziness.  He still has dizzy spells and confusion.  His wife also reports that he takes clonidine 0.2 mg but it is unclear whether he takes it currently (have not brought medications for review). patient denies headache, chest pain, dyspnea or palpitations.  COPD: He uses Anoro for it.  He was placed on Trelegy by pulmonology clinic, but he feels choking sensation with it.  Still smokes about 2 cigarettes/day.  He has exertional dyspnea, but denies any wheezing currently. His urine test recently showed cocaine, but he and his wife are in denial.  Type II DM: His last HbA1c was 6.4 in 02/24.  He takes Jardiance 10  mg once daily, but has run out of it.  Denies any polyuria or polyphagia currently.  CKD: His last few BMP show GFR ~40.  Denies any dysuria or hematuria.  He has chronic anemia as well, but denies any recent signs of bleeding.  His Hb was 8.4 in 12/24.  He was advised to start iron supplement, but has not started yet.  He was also referred to GI, but could not get EGD or colonoscopy scheduled due to cocaine in urine.  Erectile dysfunction: He reports difficulty maintaining erection.  He has sexual desire.  Denies dysuria, hematuria or urethral discharge currently.   Past Medical History:  Diagnosis Date   Ambulates with cane    straight - uses occasionally   Anxiety    due to the stroke   Arthritis    Back pain    hx of buldging disc   Complication of anesthesia    took a while for him to wake up after previous anesthesia   CVA (cerebral vascular accident) (HCC) 10/07/2020   Diabetes mellitus without complication (HCC)    Family history of adverse reaction to anesthesia    "sometimes mom has a hard time waking up"   High cholesterol    takes Zocor daily   Hypertension    takes Benazepril and HCTZ  daily   Joint pain    Joint swelling  Memory impairment    occassional - from stroke   Myocardial infarction (HCC) 1987   Pneumonia    hx of-80's   Shortness of breath dyspnea    do to pain   Sleep apnea    never had a sleep study,but states Dr. Janna Arch says he has it   Slurred speech    Stroke (HCC) 08/2013   7 mini-strokes, last stroke 2017   TIA (transient ischemic attack) 2014   x 7    Urinary frequency     Past Surgical History:  Procedure Laterality Date   ABDOMINAL EXPOSURE N/A 06/16/2018   Procedure: ABDOMINAL EXPOSURE;  Surgeon: Larina Earthly, MD;  Location: [facility] OR;  Service: Vascular;  Laterality: N/A;   ANKLE SURGERY  2008   left ankle-otif-[facility]   ANTERIOR LUMBAR FUSION Bilateral 06/16/2018   Procedure: LUMBAR 4-5 LUMBAR 5-SACRUM 1 ANTERIOR LUMBAR INTERBODY  FUSION WITH INSTRUMENTATION AND ALLOGRAFT;  Surgeon: Estill Bamberg, MD;  Location: [facility] OR;  Service: Orthopedics;  Laterality: Bilateral;   BACK SURGERY     HEMATOMA EVACUATION Left 08/13/2018   Procedure: EVACUATION HEMATOMA LEFT ABDOMINAL WALL;  Surgeon: Larina Earthly, MD;  Location: [facility] OR;  Service: Vascular;  Laterality: Left;   IR RADIOLOGIST EVAL & MGMT  07/27/2018   IR US GUIDE BX ASP/DRAIN  07/14/2018   JOINT REPLACEMENT     both hips replaced    LUMBAR LAMINECTOMY/DECOMPRESSION MICRODISCECTOMY Right 10/17/2014   Procedure: LUMBAR LAMINECTOMY/DECOMPRESSION MICRODISCECTOMY 2 LEVELS;  Surgeon: Lisbeth Renshaw, MD;  Location: [facility] ORS;  Service: Neurosurgery;  Laterality: Right;  Right L45 L5S1 laminectomy and foraminotomy   MASS EXCISION  09/13/2012   Procedure: EXCISION MASS;  Surgeon: Dalia Heading, MD;  Location: [facility] ORS;  Service: General;  Laterality: N/A;   ROBOTIC ASSITED PARTIAL NEPHRECTOMY Left 03/30/2019   Procedure: XI ROBOTIC ASSITED PARTIAL NEPHRECTOMY POSSIBLE RADICAL NEPHRECTOMY;  Surgeon: Rene Paci, MD;  Location: [facility] ORS;  Service: Urology;  Laterality: Left;   SHOULDER ARTHROSCOPY WITH ROTATOR CUFF REPAIR Right 04/12/2020   Procedure: right shoulder arthroscopy, debridement, mini open rotator cuff tear repair;  Surgeon: Cammy Copa, MD;  Location: [facility] OR;  Service: Orthopedics;  Laterality: Right;   TONSILLECTOMY     TOTAL HIP ARTHROPLASTY Right 03/20/2015   TOTAL HIP ARTHROPLASTY Right 03/20/2015   Procedure: TOTAL HIP ARTHROPLASTY ANTERIOR APPROACH;  Surgeon: Sheral Apley, MD;  Location: [facility] OR;  Service: Orthopedics;  Laterality: Right;   TOTAL HIP ARTHROPLASTY Left 01/08/2016   Procedure: TOTAL HIP ARTHROPLASTY ANTERIOR APPROACH;  Surgeon: Sheral Apley, MD;  Location: [facility] OR;  Service: Orthopedics;  Laterality: Left;    Family History  Problem Relation Age of Onset   Heart disease Father     Social History   Socioeconomic History    Marital status: Married    Spouse name: Not on file   Number of children: Not on file   Years of education: Not on file   Highest education level: 12th grade  Occupational History   Not on file  Tobacco Use   Smoking status: Some Days    Current packs/day: 0.25    Average packs/day: 0.3 packs/day for 47.0 years (11.8 ttl pk-yrs)    Types: Cigarettes    Passive exposure: Current   Smokeless tobacco: Never   Tobacco comments:    Currently smokes about 2 cigarettes per day.  07/14/2023 hfb  Vaping Use   Vaping status: Never Used  Substance and Sexual Activity  Alcohol use: Not Currently    Comment: quit 2012   Drug use: Not Currently    Types: Cocaine    Comment: many yrs ago., last time- late 2016   Sexual activity: Yes  Other Topics Concern   Not on file  Social History Narrative   Are you right handed or left handed? Right   Are you currently employed ? disabled   What is your current occupation?   Do you live at home alone? NO   Who lives with you? Mother, Wife   What type of home do you live in: 1 story or 2 story? 1       Social Drivers of Corporate investment banker Strain: Medium Risk (12/28/2023)   Overall Financial Resource Strain (CARDIA)    Difficulty of Paying Living Expenses: Somewhat hard  Food Insecurity: Food Insecurity Present (12/28/2023)   Hunger Vital Sign    Worried About Running Out of Food in the Last Year: Sometimes true    Ran Out of Food in the Last Year: Sometimes true  Transportation Needs: Unmet Transportation Needs (12/28/2023)   PRAPARE - Administrator, Civil Service (Medical): Yes    Lack of Transportation (Non-Medical): Yes  Physical Activity: Insufficiently Active (12/28/2023)   Exercise Vital Sign    Days of Exercise per Week: 1 day    Minutes of Exercise per Session: 10 min  Stress: Stress Concern Present (12/28/2023)   [employer] of Occupational Health - Occupational Stress Questionnaire    Feeling of Stress : To  some extent  Social Connections: Moderately Integrated (12/28/2023)   Social Connection and Isolation Panel [NHANES]    Frequency of Communication with Friends and Family: Once a week    Frequency of Social Gatherings with Friends and Family: Once a week    Attends Religious Services: More than 4 times per year    Active Member of [employer] or Organizations: Yes    Attends Engineer, structural: More than 4 times per year    Marital Status: Married  Catering manager Violence: Not At Risk (01/13/2023)   Humiliation, Afraid, Rape, and Kick questionnaire    Fear of Current or Ex-Partner: No    Emotionally Abused: No    Physically Abused: No    Sexually Abused: No    ROS Review of Systems  Constitutional:  Negative for chills and fever.  HENT:  Negative for congestion and sore throat.   Eyes:  Negative for pain and discharge.  Respiratory:  Positive for shortness of breath (Intermittent). Negative for cough.   Cardiovascular:  Negative for chest pain and palpitations.  Gastrointestinal:  Negative for diarrhea, nausea and vomiting.  Endocrine: Negative for polydipsia and polyuria.  Genitourinary:  Negative for dysuria and hematuria.  Musculoskeletal:  Positive for back pain and gait problem. Negative for neck pain and neck stiffness.  Skin:  Negative for rash.  Neurological:  Positive for speech difficulty and weakness (RUE and RLE). Negative for dizziness.  Psychiatric/Behavioral:  Negative for agitation and behavioral problems. The patient is nervous/anxious.     Objective:   Today's Vitals: BP 126/80   Pulse 86   Ht 6\' 6"  (1.981 m)   Wt 241 lb (109.3 kg)   SpO2 94%   BMI 27.85 kg/m   Physical Exam Vitals reviewed.  Constitutional:      General: He is not in acute distress.    Appearance: He is not diaphoretic.  HENT:     Head: Normocephalic  and atraumatic.     Nose: Nose normal.     Mouth/Throat:     Mouth: Mucous membranes are moist.  Eyes:     General: No  scleral icterus.    Extraocular Movements: Extraocular movements intact.  Cardiovascular:     Rate and Rhythm: Normal rate and regular rhythm.     Heart sounds: Normal heart sounds. No murmur heard. Pulmonary:     Breath sounds: Normal breath sounds. No wheezing or rales.  Musculoskeletal:     Cervical back: Neck supple. No tenderness.     Right lower leg: No edema.     Left lower leg: No edema.  Skin:    General: Skin is warm.     Findings: No rash.  Neurological:     General: No focal deficit present.     Mental Status: He is alert and oriented to person, place, and time.     Motor: Weakness (RUE and RLE - 4/5) present.     Comments: Expressive aphasia -improved  Psychiatric:        Mood and Affect: Mood normal.        Behavior: Behavior normal.     Assessment & Plan:   Problem List Items Addressed This Visit       Cardiovascular and Mediastinum   Essential hypertension   BP Readings from Last 1 Encounters:  01/01/24 126/80   Well-controlled with amlodipine 5 mg QD Was on ramipril 10 mg once daily, discontinued due to dizziness Advised to stop clonidine -likely old prescription, likely reason for current dizziness Counseled for compliance with the medications Advised DASH diet and moderate exercise/walking as tolerated       Relevant Medications   amLODipine (NORVASC) 5 MG tablet   atorvastatin (LIPITOR) 40 MG tablet     Respiratory   COPD (chronic obstructive pulmonary disease) (HCC)   Well controlled with Anoro, followed by pulmonology Did not like the effect of Trelegy        Endocrine   Type 2 diabetes mellitus with other specified complication (HCC) - Primary   Lab Results  Component Value Date   HGBA1C 6.8 (H) 10/01/2023   Well controlled Associated with HTN, HLD and h/o CVA On Jardiance 10 mg QD, needs to be compliant Advised to follow diabetic diet Needs to be on statin, on ACEi -restart atorvastatin F/u CMP, HbA1c and lipid panel Diabetic  eye exam: Advised to follow up with Ophthalmology for diabetic eye exam       Relevant Medications   atorvastatin (LIPITOR) 40 MG tablet   JARDIANCE 10 MG TABS tablet     Musculoskeletal and Integument   Lumbar spondylosis   Has chronic low back pain with radicular symptoms in LE Restart gabapentin 100 mg twice daily - states that copay was high, advised to check again Avoid heavy lifting and frequent bending      Relevant Medications   gabapentin (NEURONTIN) 100 MG capsule     Genitourinary   CKD (chronic kidney disease), stage III (HCC)   Chart review suggests GFR ~ 40 Needs to maintain adequate hydration On ACEi and Jardiance currently Check urine microalbumin/creatinine ratio      Relevant Orders   CMP14+EGFR   Hemoglobin A1c     Other   Hyperlipidemia   Restart statin, advised to stay compliant Check lipid profile      Relevant Medications   amLODipine (NORVASC) 5 MG tablet   atorvastatin (LIPITOR) 40 MG tablet   History of CVA (cerebrovascular  accident)   Needs to continue Plavix and restart statin Referred to speech therapy for expressive aphasia Needs to avoid substances as they can increase his risk of recurrent CVA or CAD significantly      Relevant Medications   clopidogrel (PLAVIX) 75 MG tablet   Periodic limb movement   Uncontrolled with Requip 0.5 mg - he tried taking 2 tablets qHS, now better Increased dose of Requip to 1 mg qHS      Relevant Medications   rOPINIRole (REQUIP) 1 MG tablet   Aphasia as late effect of cerebrovascular accident (CVA)   Needs to continue Plavix and statin-refilled Referred to speech therapy - improving with it      Iron deficiency anemia due to chronic blood loss   Hb: ~8 Likely due to CKD Needs GI evaluation for ruling out occult GI bleeding - needs to refrain from using substances to get EGD and colonoscopy scheduled Check CBC, iron profile, folic acid, B12 Needs to start iron supplement      Relevant  Orders   CBC with Differential/Platelet   B12   Folate   Fe+TIBC+Fer   Other Visit Diagnoses       Encounter for immunization       Relevant Orders   Pneumococcal conjugate vaccine 20-valent (Completed)       Outpatient Encounter Medications as of 01/01/2024  Medication Sig   Accu-Chek Softclix Lancets lancets SMARTSIG:Topical 2-3 Times Daily   aspirin EC 81 MG tablet Take 81 mg by mouth daily. Swallow whole.   lidocaine (LIDODERM) 5 % Place 1 patch onto the skin daily. Remove & Discard patch within 12 hours or as directed by MD   sildenafil (VIAGRA) 25 MG tablet Take 1 tablet (25 mg total) by mouth daily as needed for erectile dysfunction.   umeclidinium-vilanterol (ANORO ELLIPTA) 62.5-25 MCG/ACT AEPB Inhale 1 puff into the lungs daily.   umeclidinium-vilanterol (ANORO ELLIPTA) 62.5-25 MCG/ACT AEPB Inhale 1 puff into the lungs daily.   [DISCONTINUED] amLODipine (NORVASC) 5 MG tablet TAKE 1 TABLET(5 MG) BY MOUTH DAILY   [DISCONTINUED] atorvastatin (LIPITOR) 40 MG tablet Take 1 tablet (40 mg total) by mouth daily.   [DISCONTINUED] clopidogrel (PLAVIX) 75 MG tablet Take 1 tablet (75 mg total) by mouth daily.   [DISCONTINUED] JARDIANCE 10 MG TABS tablet Take 1 tablet (10 mg total) by mouth daily.   [DISCONTINUED] rOPINIRole (REQUIP) 0.5 MG tablet Take 1 tablet at bedtime   amLODipine (NORVASC) 5 MG tablet Take 1 tablet (5 mg total) by mouth daily.   atorvastatin (LIPITOR) 40 MG tablet Take 1 tablet (40 mg total) by mouth daily.   clopidogrel (PLAVIX) 75 MG tablet Take 1 tablet (75 mg total) by mouth daily.   Fluticasone-Umeclidin-Vilant (TRELEGY ELLIPTA) 100-62.5-25 MCG/ACT AEPB Inhale 1 puff into the lungs daily. (Patient not taking: Reported on 10/12/2023)   gabapentin (NEURONTIN) 100 MG capsule Take 1 capsule (100 mg total) by mouth 2 (two) times daily.   JARDIANCE 10 MG TABS tablet Take 1 tablet (10 mg total) by mouth daily.   rOPINIRole (REQUIP) 1 MG tablet Take 1 tablet (1 mg  total) by mouth at bedtime.   [DISCONTINUED] gabapentin (NEURONTIN) 100 MG capsule Take 1 capsule (100 mg total) by mouth 2 (two) times daily.   [DISCONTINUED] ondansetron (ZOFRAN) 4 MG tablet Take 1 tablet (4 mg total) by mouth every 8 (eight) hours as needed for nausea or vomiting.   [DISCONTINUED] ramipril (ALTACE) 10 MG capsule Take 1 capsule (10 mg total) by  mouth daily.   No facility-administered encounter medications on file as of 01/01/2024.    Follow-up: Return in about 4 months (around 04/30/2024) for DM and CKD.   Anabel Halon, MD

## 2024-01-01 NOTE — Assessment & Plan Note (Addendum)
Needs to continue Plavix and statin-refilled Referred to speech therapy - improving with it

## 2024-01-01 NOTE — Assessment & Plan Note (Signed)
Uncontrolled with Requip 0.5 mg - he tried taking 2 tablets qHS, now better Increased dose of Requip to 1 mg qHS

## 2024-01-01 NOTE — Assessment & Plan Note (Addendum)
Lab Results  Component Value Date   HGBA1C 6.8 (H) 10/01/2023   Well controlled Associated with HTN, HLD and h/o CVA On Jardiance 10 mg QD, needs to be compliant Advised to follow diabetic diet Needs to be on statin, on ACEi -restart atorvastatin F/u CMP, HbA1c and lipid panel Diabetic eye exam: Advised to follow up with Ophthalmology for diabetic eye exam

## 2024-01-01 NOTE — Assessment & Plan Note (Signed)
Restart statin, advised to stay compliant Check lipid profile

## 2024-01-01 NOTE — Assessment & Plan Note (Signed)
Chart review suggests GFR ~ 40 Needs to maintain adequate hydration On ACEi and Jardiance currently Check urine microalbumin/creatinine ratio

## 2024-01-01 NOTE — Assessment & Plan Note (Signed)
Needs to continue Plavix and restart statin Referred to speech therapy for expressive aphasia Needs to avoid substances as they can increase his risk of recurrent CVA or CAD significantly

## 2024-01-01 NOTE — Assessment & Plan Note (Addendum)
BP Readings from Last 1 Encounters:  01/01/24 126/80   Well-controlled with amlodipine 5 mg QD Was on ramipril 10 mg once daily, discontinued due to dizziness Advised to stop clonidine -likely old prescription, likely reason for current dizziness Counseled for compliance with the medications Advised DASH diet and moderate exercise/walking as tolerated

## 2024-01-01 NOTE — Patient Instructions (Addendum)
Please stop taking Clonidine as discussed.  Please do not take Ramipril. Continue Amlodipine as prescribed.  Please take ferrous sulfate 325 mg once daily. Please start taking B complex multivitamin once daily.  Please continue to take medications as prescribed.  Please continue to follow low carb diet and perform moderate exercise/walking as tolerated.  Please schedule diabetic eye exam.

## 2024-01-01 NOTE — Assessment & Plan Note (Signed)
Well controlled with Anoro, followed by pulmonology Did not like the effect of Trelegy

## 2024-01-01 NOTE — Assessment & Plan Note (Signed)
Has chronic low back pain with radicular symptoms in LE Restart gabapentin 100 mg twice daily - states that copay was high, advised to check again Avoid heavy lifting and frequent bending

## 2024-01-02 ENCOUNTER — Other Ambulatory Visit: Payer: Self-pay | Admitting: Internal Medicine

## 2024-01-02 DIAGNOSIS — I10 Essential (primary) hypertension: Secondary | ICD-10-CM

## 2024-01-02 LAB — CBC WITH DIFFERENTIAL/PLATELET
Basophils Absolute: 0 10*3/uL (ref 0.0–0.2)
Basos: 0 %
EOS (ABSOLUTE): 0.1 10*3/uL (ref 0.0–0.4)
Eos: 2 %
Hematocrit: 25.7 % — ABNORMAL LOW (ref 37.5–51.0)
Hemoglobin: 7 g/dL — CL (ref 13.0–17.7)
Immature Grans (Abs): 0 10*3/uL (ref 0.0–0.1)
Immature Granulocytes: 0 %
Lymphocytes Absolute: 1.3 10*3/uL (ref 0.7–3.1)
Lymphs: 23 %
MCH: 18.6 pg — ABNORMAL LOW (ref 26.6–33.0)
MCHC: 27.2 g/dL — ABNORMAL LOW (ref 31.5–35.7)
MCV: 68 fL — ABNORMAL LOW (ref 79–97)
Monocytes Absolute: 0.4 10*3/uL (ref 0.1–0.9)
Monocytes: 6 %
Neutrophils Absolute: 3.8 10*3/uL (ref 1.4–7.0)
Neutrophils: 69 %
Platelets: 406 10*3/uL (ref 150–450)
RBC: 3.77 x10E6/uL — ABNORMAL LOW (ref 4.14–5.80)
RDW: 19.1 % — ABNORMAL HIGH (ref 11.6–15.4)
WBC: 5.6 10*3/uL (ref 3.4–10.8)

## 2024-01-02 LAB — IRON,TIBC AND FERRITIN PANEL
Ferritin: 8 ng/mL — ABNORMAL LOW (ref 30–400)
Iron Saturation: 3 % — CL (ref 15–55)
Iron: 15 ug/dL — ABNORMAL LOW (ref 38–169)
Total Iron Binding Capacity: 491 ug/dL — ABNORMAL HIGH (ref 250–450)
UIBC: 476 ug/dL — ABNORMAL HIGH (ref 111–343)

## 2024-01-02 LAB — CMP14+EGFR
ALT: 12 [IU]/L (ref 0–44)
AST: 15 [IU]/L (ref 0–40)
Albumin: 4.4 g/dL (ref 3.9–4.9)
Alkaline Phosphatase: 92 [IU]/L (ref 44–121)
BUN/Creatinine Ratio: 13 (ref 10–24)
BUN: 28 mg/dL — ABNORMAL HIGH (ref 8–27)
Bilirubin Total: <0.2 mg/dL (ref 0.0–1.2)
CO2: 22 mmol/L (ref 20–29)
Calcium: 11.1 mg/dL — ABNORMAL HIGH (ref 8.6–10.2)
Chloride: 105 mmol/L (ref 96–106)
Creatinine, Ser: 2.09 mg/dL — ABNORMAL HIGH (ref 0.76–1.27)
Globulin, Total: 2.8 g/dL (ref 1.5–4.5)
Glucose: 104 mg/dL — ABNORMAL HIGH (ref 70–99)
Potassium: 5.2 mmol/L (ref 3.5–5.2)
Sodium: 140 mmol/L (ref 134–144)
Total Protein: 7.2 g/dL (ref 6.0–8.5)
eGFR: 35 mL/min/{1.73_m2} — ABNORMAL LOW (ref >59)

## 2024-01-02 LAB — FOLATE: Folate: 8.9 ng/mL (ref >3.0)

## 2024-01-02 LAB — HEMOGLOBIN A1C
Est. average glucose Bld gHb Est-mCnc: 148 mg/dL
Hgb A1c MFr Bld: 6.8 % — ABNORMAL HIGH (ref 4.8–5.6)

## 2024-01-02 LAB — VITAMIN B12: Vitamin B-12: 440 pg/mL (ref 232–1245)

## 2024-01-04 ENCOUNTER — Ambulatory Visit (INDEPENDENT_AMBULATORY_CARE_PROVIDER_SITE_OTHER): Payer: MEDICAID

## 2024-01-04 ENCOUNTER — Ambulatory Visit: Payer: Self-pay | Admitting: Internal Medicine

## 2024-01-04 DIAGNOSIS — E1159 Type 2 diabetes mellitus with other circulatory complications: Secondary | ICD-10-CM

## 2024-01-04 DIAGNOSIS — E1169 Type 2 diabetes mellitus with other specified complication: Secondary | ICD-10-CM

## 2024-01-04 LAB — HM DIABETES EYE EXAM

## 2024-01-04 NOTE — Progress Notes (Signed)
Raymond Barrera. arrived 01/04/2024 and has given verbal consent to obtain images and complete their overdue diabetic retinal screening.  The images have been sent to an ophthalmologist or optometrist for review and interpretation.  Results will be sent back to Raymond Halon, MD for review.  Patient has been informed they will be contacted when we receive the results via telephone or MyChart

## 2024-01-05 ENCOUNTER — Encounter (HOSPITAL_COMMUNITY): Payer: MEDICAID | Admitting: Speech Pathology

## 2024-01-11 ENCOUNTER — Encounter: Payer: Self-pay | Admitting: Internal Medicine

## 2024-01-12 ENCOUNTER — Ambulatory Visit (HOSPITAL_COMMUNITY): Payer: MEDICAID | Attending: Internal Medicine | Admitting: Speech Pathology

## 2024-01-12 DIAGNOSIS — R41841 Cognitive communication deficit: Secondary | ICD-10-CM | POA: Diagnosis present

## 2024-01-12 DIAGNOSIS — R471 Dysarthria and anarthria: Secondary | ICD-10-CM | POA: Diagnosis present

## 2024-01-12 NOTE — Therapy (Signed)
OUTPATIENT SPEECH LANGUAGE PATHOLOGY TREATMENT   Patient Name: Collin Rengel. MRN: [id number] DOB:May 02, 1961, 63 y.o., male 65 Date: 01/12/2024  PCP: Anabel Halon, MD REFERRING PROVIDER: Anabel Halon, MD  END OF SESSION:  End of Session - 01/12/24 1444     Visit Number 7    Number of Visits 8    Date for SLP Re-Evaluation 01/28/24    Authorization Type [employer]    SLP Start Time 1439    SLP Stop Time  1525    SLP Time Calculation (min) 46 min    Activity Tolerance Patient tolerated treatment well             Past Medical History:  Diagnosis Date   Ambulates with cane    straight - uses occasionally   Anxiety    due to the stroke   Arthritis    Back pain    hx of buldging disc   Complication of anesthesia    took a while for him to wake up after previous anesthesia   CVA (cerebral vascular accident) (HCC) 10/07/2020   Diabetes mellitus without complication (HCC)    Family history of adverse reaction to anesthesia    "sometimes mom has a hard time waking up"   High cholesterol    takes Zocor daily   Hypertension    takes Benazepril and HCTZ  daily   Joint pain    Joint swelling    Memory impairment    occassional - from stroke   Myocardial infarction (HCC) 1987   Pneumonia    hx of-80's   Shortness of breath dyspnea    do to pain   Sleep apnea    never had a sleep study,but states Dr. Janna Arch says he has it   Slurred speech    Stroke (HCC) 08/2013   7 mini-strokes, last stroke 2017   TIA (transient ischemic attack) 2014   x 7    Urinary frequency    Past Surgical History:  Procedure Laterality Date   ABDOMINAL EXPOSURE N/A 06/16/2018   Procedure: ABDOMINAL EXPOSURE;  Surgeon: Larina Earthly, MD;  Location: [facility] OR;  Service: Vascular;  Laterality: N/A;   ANKLE SURGERY  2008   left ankle-otif-[facility]   ANTERIOR LUMBAR FUSION Bilateral 06/16/2018   Procedure: LUMBAR 4-5 LUMBAR 5-SACRUM 1 ANTERIOR LUMBAR INTERBODY FUSION WITH  INSTRUMENTATION AND ALLOGRAFT;  Surgeon: Estill Bamberg, MD;  Location: [facility] OR;  Service: Orthopedics;  Laterality: Bilateral;   BACK SURGERY     HEMATOMA EVACUATION Left 08/13/2018   Procedure: EVACUATION HEMATOMA LEFT ABDOMINAL WALL;  Surgeon: Larina Earthly, MD;  Location: [facility] OR;  Service: Vascular;  Laterality: Left;   IR RADIOLOGIST EVAL & MGMT  07/27/2018   IR US GUIDE BX ASP/DRAIN  07/14/2018   JOINT REPLACEMENT     both hips replaced    LUMBAR LAMINECTOMY/DECOMPRESSION MICRODISCECTOMY Right 10/17/2014   Procedure: LUMBAR LAMINECTOMY/DECOMPRESSION MICRODISCECTOMY 2 LEVELS;  Surgeon: Lisbeth Renshaw, MD;  Location: [facility];  Service: Neurosurgery;  Laterality: Right;  Right L45 L5S1 laminectomy and foraminotomy   MASS EXCISION  09/13/2012   Procedure: EXCISION MASS;  Surgeon: Dalia Heading, MD;  Location: [facility] ORS;  Service: General;  Laterality: N/A;   ROBOTIC ASSITED PARTIAL NEPHRECTOMY Left 03/30/2019   Procedure: XI ROBOTIC ASSITED PARTIAL NEPHRECTOMY POSSIBLE RADICAL NEPHRECTOMY;  Surgeon: Rene Paci, MD;  Location: [facility] ORS;  Service: Urology;  Laterality: Left;   SHOULDER ARTHROSCOPY WITH ROTATOR CUFF REPAIR Right 04/12/2020  Procedure: right shoulder arthroscopy, debridement, mini open rotator cuff tear repair;  Surgeon: Cammy Copa, MD;  Location: [facility] OR;  Service: Orthopedics;  Laterality: Right;   TONSILLECTOMY     TOTAL HIP ARTHROPLASTY Right 03/20/2015   TOTAL HIP ARTHROPLASTY Right 03/20/2015   Procedure: TOTAL HIP ARTHROPLASTY ANTERIOR APPROACH;  Surgeon: Sheral Apley, MD;  Location: [facility] OR;  Service: Orthopedics;  Laterality: Right;   TOTAL HIP ARTHROPLASTY Left 01/08/2016   Procedure: TOTAL HIP ARTHROPLASTY ANTERIOR APPROACH;  Surgeon: Sheral Apley, MD;  Location: [facility] OR;  Service: Orthopedics;  Laterality: Left;   Patient Active Problem List   Diagnosis Date Noted   Microcytic anemia 10/12/2023   Iron deficiency anemia due to chronic blood loss  10/12/2023   Nausea without vomiting 10/12/2023   Colon cancer screening 10/02/2023   Elevated PSA 10/02/2023   Aphasia as late effect of cerebrovascular accident (CVA) 10/01/2023   Erectile dysfunction 10/01/2023   Prostate cancer screening 10/01/2023   COPD (chronic obstructive pulmonary disease) (HCC) 05/28/2023   Mild sleep apnea 05/28/2023   Periodic limb movement 05/28/2023   Loud snoring 03/03/2023   Dyspnea 03/03/2023   Normocytic anemia 01/13/2023   CKD (chronic kidney disease), stage III (HCC) 01/13/2023   History of CVA (cerebrovascular accident) 01/13/2023   Chronic systolic heart failure (HCC)    History of renal cell carcinoma    Type 2 diabetes mellitus with other specified complication (HCC) 02/01/2017   Essential hypertension 02/01/2017   Primary osteoarthritis of left hip 01/08/2016   DJD (degenerative joint disease) 03/20/2015   Primary osteoarthritis of right hip 02/26/2015   Lumbar spondylosis 10/17/2014   Abnormality of gait 07/25/2014   TIA (transient ischemic attack) 03/09/2013   Cocaine abuse (HCC) 03/09/2013   Hyperlipidemia 03/09/2013   Current smoker 03/09/2013    ONSET DATE: 10/01/2023   REFERRING DIAG: N56.213 (ICD-10-CM) - Aphasia as late effect of cerebrovascular accident (CVA)  THERAPY DIAG:  Dysarthria and anarthria  Cognitive communication deficit  Rationale for Evaluation and Treatment: Rehabilitation  SUBJECTIVE:   SUBJECTIVE STATEMENT: "I don't think my speech is too different."  Pt accompanied by: self  PERTINENT HISTORY: Argel Pablo is a 63 yo male who was referred for outpatient SLP evaluation and treatment by Dr. Patterson Hammersmith (PCP). Pt with past medical history of CVA with expressive aphasia, HTN, COPD, type II DM, CKD, RCC s/p nephrectomy, restless leg syndrome and tobacco abuse. CVA with residual expressive aphasia and dysarthria. He was seen for outpatient SLP therapy in 2022.  PAIN:  Are you having pain? No  PATIENT  GOALS: Improve his ability to verbalize his thoughts  OBJECTIVE:  Note: Objective measures were completed at Evaluation unless otherwise noted.  DIAGNOSTIC FINDINGS:  MRI: Chronic infarct in the corona radiata on the left. Sequela of moderate chronic microvascular ischemic change. CT Head: Remote infarcts in the bilateral basal ganglia/corona radiata and left aspect of the corpus callosum and background chronic small-vessel ischemic change are noted.  PATIENT EDUCATION: Education details: Encouraged them to purchase picture flash cards from dollar store to practice word description tasks at home Person educated: Patient and Spouse Education method: Explanation Education comprehension: verbalized understanding  Previous treatment:  Pt accompanied to therapy by his wife. He and his wife indicated they have been sick and have an appointment with his PCP. SLP encouraged them to cancel/reschedule our SLP sessions if they are sick. Pt was seen today and all parties wore a mask. SLP reviewed word finding and word  description strategies and then targeted throughout the session. Pt challenged to provide the most salient features during object description tasks. He tends to describe by what he can see, for example, he described a rattle as "round, yellow, blue". He needed cues to state the most important features (baby, noise, shake). He completed the task with 90% acc when mi/mod cues provided. He named to description with 95% acc. SLP then added a timed "stressor" to further challenge him and this appeared to only impact him when he had to provide the description, not when guessing the word. SLP had to request repetition for speech intelligibility about 20% of the time during structured barrier tasks. Attendance has been intermittent due to Pt illness and transportation difficulties. Will schedule for 2 more SLP sessions in preparation for discharge. Continue to target goals. Pt and spouse are in  agreement with plan of care.                                                                                                     TODAY'S TREATMENT: Pt accompanied to therapy by his wife. He missed last week's session due to illness. He indicates that he doesn't feel like his speech is improving. His wife indicates that he thinks his speech is better after he attends therapy and thinks he is more motivated to practice after sessions. Progress toward goals has been slow and suspect due to long-standing expressive communication difficulties from previous and multiple strokes. [name] would like him to keep coming to therapy. Moderate expressive deficits with dysarthria, aphasia, and dysfluency persist. Speech intelligibility reduced at the conversation level and Pt required SLP cues to pause, think, reduce rate, and over articulate (4x in 10 minutes). He completed naming to description task with 90% acc and min cues. He was then asked to describe words by salient features and he required mod cues as he still tends to visually describe the picture (color attributes) and does not state the function or associated words. Performance improved when this was completed in open forum (as opposed to barrier task). SLP provided examples of not providing enough information and encouraged Pt to ask questions to obtain more specific responses. Will reschedule last week's missed appointment to next week. Pt given list of occupations to describe with family at home. Continue plan of care.  DATE: 01/12/24  GOALS: Goals reviewed with patient? Yes  SHORT TERM GOALS: Target date: 01/28/2024  Pt will increase conversation level speech intelligibility to 90% by utilizing appropriate strategies and listener needing repetition no more than 2x in a 10-minute conversation. Baseline: ~4x in 10 minutes Goal status: PARTIALLY MET/CONTINUE  2.  Pt will complete moderate level verbal expression tasks (object description, state  function, provide short summary, etc) using short sentences with 90% acc and min assist. Baseline: mi/mod assist Goal status: PARTIALLY MET/CONTINUE  3.  Pt will implement word-finding strategies with 90% accuracy when unable to verbalize desired word in conversation/functional tasks with min assist. Baseline: mi/mod assist Goal status: PARTIALLY MET/CONTINUE  4.  Pt will answer open ended questions related to personally  relevant information with 95% acc with min assist. Baseline: 90% Goal status: PARTIALLY MET/CONTINUE   ASSESSMENT:  CLINICAL IMPRESSION: (from initial evaluation 10/13/23) Patient is a 63 y.o. male who was seen today for a cognitive linguistic evaluation. He scored a 21/30 on the [facility] with errors in attention, memory, and executive functioning. He scored 11/15 on the [facility] test with greater difficulty with low frequency items (tripod, sphinx, palette). He provided appropriate content during picture description task with mildly reduced speech intelligibility. When asked what he would like to work on, he indicated that he needs help "getting words out" and that he has more trouble speaking to groups of people. His wife indicates that he becomes frustrated and "gives up". Pt with mild imprecise articulation and difficulty with multisyllabic words with some hesitations observed. He described a typical day as: wake up, pray, eat, get dressed, go outside, rake leaves, help his mom, and watch TV. Pt has a history of cognitive communication deficits from his previous strokes, but is motivated to participate in therapy again. Recommend short term SLP therapy to address the goals stated above, 1x per week for 4 weeks.  OBJECTIVE IMPAIRMENTS: include attention, executive functioning, and dysarthria. These impairments are limiting patient from effectively communicating at home and in community. Factors affecting potential to achieve goals and functional outcome are ability  to learn/carryover information, previous level of function, and time post stroke onset . Patient will benefit from skilled SLP services to address above impairments and improve overall function.  REHAB POTENTIAL: Fair (time post stroke)  PLAN:  SLP FREQUENCY: 1x/week  SLP DURATION: 2 weeks  PLANNED INTERVENTIONS: [phone]- [address] [place], Phon, Eval Compre, Express, [phone] Treatment of speech (30 or 45 min) , Re-evaluation, Cueing hierachy, Cognitive reorganization, and Internal/external aids   Managed Medicaid Authorization Request  Visit Dx Codes: [phone] (ICD-10-CM) - Aphasia as late effect of cerebrovascular accident (CVA), R47.1, R41.841  Functional Tool Score: N/A  For all possible CPT codes, reference the Planned Interventions line above.     Check all conditions that are expected to impact treatment: {Conditions expected to impact treatment:N/A   If treatment provided at initial evaluation, no treatment charged due to lack of authorization.      Thank you,  Havery Moros, CCC-SLP [phone]  Chantrell Apsey, CCC-SLP 01/12/2024, 2:45 PM

## 2024-01-14 ENCOUNTER — Ambulatory Visit: Payer: MEDICAID | Admitting: Primary Care

## 2024-01-20 ENCOUNTER — Encounter (HOSPITAL_COMMUNITY): Payer: Self-pay | Admitting: Speech Pathology

## 2024-01-21 ENCOUNTER — Ambulatory Visit: Payer: Self-pay | Admitting: Internal Medicine

## 2024-01-21 NOTE — Patient Instructions (Signed)

## 2024-01-21 NOTE — Telephone Encounter (Signed)
  Chief Complaint: burning bilateral legs Symptoms: burning, pain Frequency: 3 months, worsening today Pertinent Negatives: Patient denies CP, SOB, redness, warmth, swelling Disposition: [] ED /[] Urgent Care (no appt availability in office) / [x] Appointment(In office/virtual)/ []  [place] [facility]/ [] Home Care/ [] Refused Recommended Disposition /[] [place] [facility]/ []  Follow-up with PCP Additional Notes: Patient wife, [name], calls with patient present reporting worsening burning from hips to feet. States it is chronic in nature, but worsening recently. States he does have some relief with medication, states it allows him to rest. Per protocol, patient to be evaluated within 3 days. First available appointment with PCP outside of guideline. Patient scheduled with first available provider in clinic for 01/22/24 @ 0920. Care advice reviewed, patient verbalized understandingand denies further questions at this time. Alerting PCP for review.    Copied from CRM [id number]. Topic: Clinical - Red Word Triage >> Jan 21, 2024  1:19 PM Makayla J wrote: [facility] that prompted transfer to Nurse Triage: Severe burning in hips,legs, and calves?unable to walk Reason for Disposition  [1] MODERATE pain (e.g., interferes with normal activities, limping) AND [2] present > 3 days  Answer Assessment - Initial Assessment Questions 1. ONSET: "When did the pain start?"      At least three months 2. LOCATION: "Where is the pain located?"      Hips, thighs, calves, legs bilaterally 3. PAIN: "How bad is the pain?"    (Scale 1-10; or mild, moderate, severe)   -  MILD (1-3): doesn't interfere with normal activities    -  MODERATE (4-7): interferes with normal activities (e.g., work or school) or awakens from sleep, limping    -  SEVERE (8-10): excruciating pain, unable to do any normal activities, unable to walk     8/10- states lessens when resting but has constant burning. 4. WORK OR EXERCISE: "Has there been  any recent work or exercise that involved this part of the body?"      Denies 5. CAUSE: "What do you think is causing the leg pain?"     Unsure, maybe neuropathy 6. OTHER SYMPTOMS: "Do you have any other symptoms?" (e.g., chest pain, back pain, breathing difficulty, swelling, rash, fever, numbness, weakness)     Weakness with burning in legs occasionally. States the burning never subsides.  Protocols used: Leg Pain-A-AH

## 2024-01-21 NOTE — Progress Notes (Unsigned)
   Established Patient Office Visit   Subjective  Patient ID: Raymond Penning., male    DOB: 22-Sep-1961  Age: 63 y.o. MRN: [id number]  No chief complaint on file.   He  has a past medical history of Ambulates with cane, Anxiety, Arthritis, Back pain, Complication of anesthesia, CVA (cerebral vascular accident) (HCC) (10/07/2020), Diabetes mellitus without complication (HCC), Family history of adverse reaction to anesthesia, High cholesterol, Hypertension, Joint pain, Joint swelling, Memory impairment, Myocardial infarction (HCC) (1987), Pneumonia, Shortness of breath dyspnea, Sleep apnea, Slurred speech, Stroke (HCC) (08/2013), TIA (transient ischemic attack) (2014), and Urinary frequency.  Leg Pain     ROS    Objective:     There were no vitals taken for this visit. {Vitals History (Optional):23777}  Physical Exam   No results found for any visits on 01/22/24.  The ASCVD Risk score (Arnett DK, et al., 2019) failed to calculate for the following reasons:   Risk score cannot be calculated because patient has a medical history suggesting prior/existing ASCVD    Assessment & Plan:  There are no diagnoses linked to this encounter.  No follow-ups on file.   Cruzita Lederer Newman Nip, FNP

## 2024-01-22 ENCOUNTER — Ambulatory Visit (INDEPENDENT_AMBULATORY_CARE_PROVIDER_SITE_OTHER): Payer: MEDICAID | Admitting: Family Medicine

## 2024-01-22 ENCOUNTER — Encounter: Payer: Self-pay | Admitting: Family Medicine

## 2024-01-22 VITALS — BP 143/72 | HR 95 | Ht 78.0 in | Wt 242.1 lb

## 2024-01-22 DIAGNOSIS — G6289 Other specified polyneuropathies: Secondary | ICD-10-CM

## 2024-01-22 DIAGNOSIS — Z1211 Encounter for screening for malignant neoplasm of colon: Secondary | ICD-10-CM

## 2024-01-22 DIAGNOSIS — G629 Polyneuropathy, unspecified: Secondary | ICD-10-CM | POA: Insufficient documentation

## 2024-01-22 DIAGNOSIS — R252 Cramp and spasm: Secondary | ICD-10-CM | POA: Diagnosis not present

## 2024-01-22 MED ORDER — GABAPENTIN 400 MG PO CAPS: 400.0000 mg | ORAL_CAPSULE | Freq: Two times a day (BID) | ORAL | 2 refills | Status: DC

## 2024-01-22 MED ORDER — ALBUTEROL SULFATE HFA 108 (90 BASE) MCG/ACT IN AERS: 2.0000 | INHALATION_SPRAY | Freq: Four times a day (QID) | RESPIRATORY_TRACT | 2 refills | Status: DC | PRN

## 2024-01-22 NOTE — Assessment & Plan Note (Signed)
Symptoms consistent with peripheral neuropathy hx diabetes, hyperlipidemia Increased Gabapentin dose to 400 mg twice daily Restart Iron tablets  Advise to focus on blood sugar control  a nutrient-rich diet (especially B vitamins and iron), and regular exercise to improve circulation. Avoid alcohol and smoking, which can worsen nerve damage, and use pain management techniques like warm baths, gentle stretching, and topical creams. If symptoms persist follow up

## 2024-01-23 LAB — MAGNESIUM: Magnesium: 2.3 mg/dL (ref 1.6–2.3)

## 2024-01-24 NOTE — Progress Notes (Unsigned)
 Cardiology Office Note    Date:  01/26/2024  ID:  Raymond Barrera., DOB 05-14-1961, MRN [id number] PCP:  Raymond Halon, MD  Cardiologist:  Raymond Fair, MD  Electrophysiologist:  None   Chief Complaint: Dizziness   History of Present Illness: .    Raymond Barrera. is a 63 y.o. male with visit-pertinent history of hypertension, type 2 diabetes mellitus, hyperlipidemia, ischemic stroke s/p complex lumbar spine surgery, moderate CKD following nephrectomy for renal cell carcinoma in 2020.  In 2018 patient had left internal capsule ischemic stroke, patient has some mild right hand paresis as well as some difficulty with speech and poor short-term memory.  Prior to that in 2014 he had multiple mini strokes in setting of severe hypertension.  Echocardiogram in July 2019 indicated LVEF of 2025% with severe diffuse hypokinesis and grade 1 DD.  Lexiscan Myoview in July 2019 with a EF 24%, no ST segment deviation, no T wave inversion, high risk study.  In 01/2023 patient had TIA in setting of cocaine induced hypertensive urgency.  He presented with weakness to the right hand and worsening speech.  MRI/MRI of head did not show evidence of acute stroke or LVO but several abnormalities of the intracranial circulation were identified.  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  During hospitalization he had an echocardiogram that showed an EF of 30 to 35%, global hypokinesis, mild LVH, no significant valve abnormality seen.  Ascending aorta was mildly dilated at 41 mm.  Patient was last seen in clinic by Dr. Royann Barrera in March 2024, patient was doing well that time with NYHA functional class I.  He is on Jardiance, ramipril as GDMT.  Not on aldosterone antagonist or Entresto due to renal dysfunction.   It was thought that his varying degrees of blood pressure and use of cocaine was likely the reason for wide variation in his ejection fraction over the prior year.  Event monitor was ordered given recent TIA with history of CVA and his heart monitor was completed on 02/17/2023 showing presence of occasional monomorphic PVCs representing 2% of all recorded beats.  Symptom triggered recordings generally correlate with periods of frequent PVCs.  There was no atrial fibrillation noted.  PVCs were more active during the daytime and generally diminished in frequency at night.  He was started on carvedilol 3.125 twice daily.  In 03/2023 patient notified the office that he was having pruritus, felt to be related to possibly carvedilol.  Today he presents for follow-up with his wife who assists with providing history.  Patient notes that since his prior stroke he does have some difficulty with speech.  Patient and wife note that in recent weeks he has become increasingly short of breath as well as increasingly dizzy, specifically with position changes.  Patient's wife also notes that he has been having increased balance problems as well as mood changes and memory problems.  On chart review patient's last CBC indicated a hemoglobin of 7, it was recommended that he follow-up in the emergency room for possible need for blood transfusion.  Patient's wife reports that they did not present to the emergency room and his hemoglobin has not been evaluated since.  Discussed that low hemoglobin could be contributing to his overall symptoms.  His wife notes that patient was previously on iron supplementation but has  not been taking recently, patient denies any dark or tarry stools or any signs of bleeding.  Patient's wife reports that he has not been taking carvedilol in recent months, was unsure if he was post be taking.  She also notes that his primary care discontinued ramipril and started him on amlodipine 5 mg daily.  Patient's  wife notes that he has had some occasional episodes of palpitations, she is unable to report details as he just will tell her that his heart feels it was beating fast for a few minutes.  Labwork independently reviewed: 01/01/2024: Hemoglobin 7, hematocrit 25.7, iron saturation 3, sodium 140, potassium 5.2, creatinine 2.09  ROS: .   Today he denies chest pain, melena, hematuria, hemoptysis, diaphoresis, presyncope, syncope, orthopnea, and PND.  All other systems are reviewed and otherwise negative. Studies Reviewed: Marland Kitchen    EKG:  EKG is ordered today, personally reviewed, demonstrating  EKG Interpretation Date/Time:  Tuesday January 26 2024 15:21:00 EST Ventricular Rate:  87 PR Interval:  164 QRS Duration:  102 QT Interval:  356 QTC Calculation: 428 R Axis:   14  Text Interpretation: Normal sinus rhythm Minimal voltage criteria for LVH, may be normal variant ( R in aVL ) Nonspecific ST and T wave abnormality When compared with ECG of 04-Feb-2023 13:17, No significant change was found Confirmed by Reather Littler [id number]) on 01/26/2024 3:25:12 PM    CV Studies: Cardiac Studies & Procedures   ______________________________________________________________________________________________   STRESS TESTS  MYOCARDIAL PERFUSION IMAGING 06/15/2018  Narrative  Nuclear stress EF: 24%.  There was no ST segment deviation noted during stress.  No T wave inversion was noted during stress.  This is a high risk study.  The left ventricular ejection fraction is severely decreased (<30%).  High risk nuclear study due to severe dilated cardiomyopathy, LVEF<30%. There is no reversible ischemia.   ECHOCARDIOGRAM  ECHOCARDIOGRAM COMPLETE 01/13/2023  Narrative ECHOCARDIOGRAM REPORT    Patient Name:   Raymond Barrera. Date of Exam: 01/13/2023 Medical Rec #:  [id number]          Height:       78.0 in Accession #:    [id number]         Weight:       251.8 lb Date of Birth:  [birth year]          BSA:           2.490 m Patient Age:    61 years           BP:           162/92 mmHg Patient Gender: M                  HR:           66 bpm. Exam Location:  Inpatient  Procedure: 2D Echo, Cardiac Doppler and Color Doppler  Indications:    TIA  History:        Patient has prior history of Echocardiogram examinations, most recent 12/09/2019. Risk Factors:Diabetes, Dyslipidemia, Current Smoker and Hypertension. Hx TIA, stroke.  Sonographer:    Ross Ludwig RDCS (AE) Referring Phys: [id number] Lynnae January  IMPRESSIONS   1. Left ventricular ejection fraction, by estimation, is 30 to 35%. The left ventricle has moderately decreased function. The left ventricle demonstrates global hypokinesis. There is mild concentric left ventricular hypertrophy. Left ventricular diastolic parameters are consistent with Grade I diastolic dysfunction (impaired relaxation). 2. Right ventricular systolic function is normal. The right ventricular  size is normal. Tricuspid regurgitation signal is inadequate for assessing PA pressure. 3. The mitral valve is normal in structure. Trivial mitral valve regurgitation. 4. The aortic valve is tricuspid. There is mild calcification of the aortic valve. There is mild thickening of the aortic valve. Aortic valve regurgitation is not visualized. Aortic valve sclerosis/calcification is present, without any evidence of aortic stenosis. 5. Aortic dilatation noted. There is borderline dilatation of the aortic root, measuring 38 mm. There is mild dilatation of the ascending aorta, measuring 41 mm. 6. The inferior vena cava is normal in size with greater than 50% respiratory variability, suggesting right atrial pressure of 3 mmHg.  Comparison(s): Compared to prior TTE, the EF has decreased from 40-45% to ~35%. Otherwise, there is no significant change.  Conclusion(s)/Recommendation(s): No intracardiac source of embolism detected on this transthoracic study. Consider a transesophageal  echocardiogram to exclude cardiac source of embolism if clinically indicated.  FINDINGS Left Ventricle: Left ventricular ejection fraction, by estimation, is 30 to 35%. The left ventricle has moderately decreased function. The left ventricle demonstrates global hypokinesis. The left ventricular internal cavity size was normal in size. There is mild concentric left ventricular hypertrophy. Left ventricular diastolic parameters are consistent with Grade I diastolic dysfunction (impaired relaxation).  Right Ventricle: The right ventricular size is normal. No increase in right ventricular wall thickness. Right ventricular systolic function is normal. Tricuspid regurgitation signal is inadequate for assessing PA pressure.  Left Atrium: Left atrial size was normal in size.  Right Atrium: Right atrial size was normal in size.  Pericardium: There is no evidence of pericardial effusion.  Mitral Valve: The mitral valve is normal in structure. Trivial mitral valve regurgitation.  Tricuspid Valve: The tricuspid valve is normal in structure. Tricuspid valve regurgitation is trivial.  Aortic Valve: The aortic valve is tricuspid. There is mild calcification of the aortic valve. There is mild thickening of the aortic valve. Aortic valve regurgitation is not visualized. Aortic valve sclerosis/calcification is present, without any evidence of aortic stenosis. Aortic valve mean gradient measures 4.0 mmHg. Aortic valve peak gradient measures 7.3 mmHg. Aortic valve area, by VTI measures 2.31 cm.  Pulmonic Valve: The pulmonic valve was not well visualized.  Aorta: Aortic dilatation noted. There is borderline dilatation of the aortic root, measuring 38 mm. There is mild dilatation of the ascending aorta, measuring 41 mm.  Venous: The inferior vena cava is normal in size with greater than 50% respiratory variability, suggesting right atrial pressure of 3 mmHg.  IAS/Shunts: The atrial septum is grossly  normal.   LEFT VENTRICLE PLAX 2D LVIDd:         4.70 cm      Diastology LVIDs:         4.20 cm      LV e' medial:    4.57 cm/s LV PW:         1.10 cm      LV E/e' medial:  9.5 LV IVS:        1.10 cm      LV e' lateral:   5.55 cm/s LVOT diam:     2.30 cm      LV E/e' lateral: 7.8 LV SV:         65 LV SV Index:   26 LVOT Area:     4.15 cm  LV Volumes (MOD) LV vol d, MOD A2C: 220.0 ml LV vol d, MOD A4C: 281.0 ml LV vol s, MOD A2C: 141.0 ml LV vol s, MOD A4C: 188.0  ml LV SV MOD A2C:     79.0 ml LV SV MOD A4C:     281.0 ml LV SV MOD BP:      88.3 ml  RIGHT VENTRICLE             IVC RV Basal diam:  3.50 cm     IVC diam: 2.20 cm RV Mid diam:    3.60 cm RV S prime:     11.10 cm/s TAPSE (M-mode): 1.9 cm  LEFT ATRIUM             Index        RIGHT ATRIUM           Index LA diam:        2.90 cm 1.16 cm/m   RA Area:     24.50 cm LA Vol (A2C):   72.7 ml 29.20 ml/m  RA Volume:   85.10 ml  34.18 ml/m LA Vol (A4C):   66.8 ml 26.83 ml/m LA Biplane Vol: 70.6 ml 28.36 ml/m AORTIC VALVE AV Area (Vmax):    2.63 cm AV Area (Vmean):   2.21 cm AV Area (VTI):     2.31 cm AV Vmax:           135.00 cm/s AV Vmean:          99.200 cm/s AV VTI:            0.282 m AV Peak Grad:      7.3 mmHg AV Mean Grad:      4.0 mmHg LVOT Vmax:         85.50 cm/s LVOT Vmean:        52.800 cm/s LVOT VTI:          0.157 m LVOT/AV VTI ratio: 0.56  AORTA Ao Root diam: 3.80 cm Ao Asc diam:  4.10 cm  MITRAL VALVE MV Area (PHT): 2.32 cm    SHUNTS MV Decel Time: 327 msec    Systemic VTI:  0.16 m MV E velocity: 43.30 cm/s  Systemic Diam: 2.30 cm MV A velocity: 71.60 cm/s MV E/A ratio:  0.60  Laurance Flatten MD Electronically signed by Laurance Flatten MD Signature Date/Time: 01/13/2023/2:34:05 PM    Final    MONITORS  LONG TERM MONITOR (3-14 DAYS) 03/09/2023  Narrative   The dominant rhythm is sinus rhythm first-degree AV block, with normal circadian variation.   Isolated premature  ventricular contractions are seen representing 2% of all recorded beats.  Sometimes these occur in a pattern of bigeminy or trigeminy and there are rare ventricular couplets.  There is no evidence of ventricular tachycardia.  Although more than one QRS morphology is seen, there is one clear dominant PVC morphology.  The PVCs are clearly more active during the daytime and greatly diminished in frequency at night.   There is no meaningful atrial arrhythmia, including no atrial fibrillation.   Symptom triggered recordings generally correlate with periods of frequent PVCs.  Mildly abnormal arrhythmia monitor due to the presence of occasional monomorphic PVCs, representing 2% of all recorded beats.  There is no high-grade ventricular arrhythmia. The patient's symptoms seem to be attributable to frequent PVCs.  Patch Wear Time:  7 days and 0 hours (2024-03-23T14:31:56-0400 to 2024-03-30T14:50:13-0400)  Patient had a min HR of 58 bpm, max HR of 153 bpm, and avg HR of 85 bpm. Predominant underlying rhythm was Sinus Rhythm. First Degree AV Block was present. Slight P wave morphology changes were noted. Isolated SVEs were rare (<1.0%), and  no SVE Couplets or SVE Triplets were present. Isolated VEs were occasional (2.0%, 17007), VE Couplets were rare (<1.0%, 62), and no VE Triplets were present. Ventricular Bigeminy and Trigeminy were present.       ______________________________________________________________________________________________      Current Reported Medications:.    Current Meds  Medication Sig   Accu-Chek Softclix Lancets lancets SMARTSIG:Topical 2-3 Times Daily   albuterol (VENTOLIN HFA) 108 (90 Base) MCG/ACT inhaler Inhale 2 puffs into the lungs every 6 (six) hours as needed for wheezing or shortness of breath.   amLODipine (NORVASC) 2.5 MG tablet Take 1 tablet (2.5 mg total) by mouth daily.   aspirin EC 81 MG tablet Take 81 mg by mouth daily. Swallow whole.   atorvastatin (LIPITOR)  40 MG tablet Take 1 tablet (40 mg total) by mouth daily.   carvedilol (COREG) 3.125 MG tablet Take 3.125 mg by mouth 2 (two) times daily with a meal.   clopidogrel (PLAVIX) 75 MG tablet Take 1 tablet (75 mg total) by mouth daily.   gabapentin (NEURONTIN) 400 MG capsule Take 1 capsule (400 mg total) by mouth 2 (two) times daily.   JARDIANCE 10 MG TABS tablet Take 1 tablet (10 mg total) by mouth daily.   lidocaine (LIDODERM) 5 % Place 1 patch onto the skin daily. Remove & Discard patch within 12 hours or as directed by MD   rOPINIRole (REQUIP) 1 MG tablet Take 1 tablet (1 mg total) by mouth at bedtime.   umeclidinium-vilanterol (ANORO ELLIPTA) 62.5-25 MCG/ACT AEPB Inhale 1 puff into the lungs daily.   umeclidinium-vilanterol (ANORO ELLIPTA) 62.5-25 MCG/ACT AEPB Inhale 1 puff into the lungs daily.   [DISCONTINUED] amLODipine (NORVASC) 5 MG tablet Take 1 tablet (5 mg total) by mouth daily.   [DISCONTINUED] Fluticasone-Umeclidin-Vilant (TRELEGY ELLIPTA) 100-62.5-25 MCG/ACT AEPB Inhale 1 puff into the lungs daily.   [DISCONTINUED] sildenafil (VIAGRA) 25 MG tablet Take 1 tablet (25 mg total) by mouth daily as needed for erectile dysfunction.    Physical Exam:    VS:  BP 110/80 (BP Location: Right Arm, Patient Position: Sitting, Cuff Size: Normal)   Pulse 87   Ht 6\' 6"  (1.981 m)   Wt 235 lb (106.6 kg)   BMI 27.16 kg/m    Wt Readings from Last 3 Encounters:  01/26/24 235 lb (106.6 kg)  01/22/24 242 lb 1.3 oz (109.8 kg)  01/01/24 241 lb (109.3 kg)   GEN: Well nourished, well developed in no acute distress NECK: No JVD; No carotid bruits CARDIAC: RRR, no murmurs, rubs, gallops RESPIRATORY:  Clear to auscultation without rales, wheezing or rhonchi  ABDOMEN: Soft, non-tender, non-distended EXTREMITIES:  No edema; No acute deformity  Asessement and Plan:.    Chronic systolic heart failure: Echo in February 2024 showed EF 30 to 35%, concentric LVH, grade 1 DD.  Today patient is wife note increased  shortness of breath and some lower extremity edema, not appreciated on exam today.  Patient reports that he has continued using cocaine, discussed that this could be contributing to his decreased ejection fraction.  Patient denies chest pain.  Ramipril has been discontinued by his primary care provider and he was started on amlodipine. Continue Jardiance.  GDMT limited by kidney function.  Given increased shortness of breath will check echocardiogram although question if this is likely related to anemia as noted below. Addition of carvedilol stopped secondary to itching and orthostasis.  Anemia: Patient and his wife note in recent weeks he has been having increased dizziness, lightheadedness and  feelings of being off balanced.  He also endorses increased shortness of breath.  On chart review patient's hemoglobin was 7.0 on 01/01/2024 it was recommended that he present to the emergency room for further evaluation.  Patient reports that they did not go to the emergency room and this has not been further evaluated.  Patient was following with GI last year for possible iron deficiency anemia, notes he has not been taking his iron recently.  Question if majority of his symptoms are coming from a low hemoglobin.  Patient denies any presyncope or syncope, any significant bleeding such as blood in his urine or in his stool.  Will check CBC, BMET and TSH.  Discussed with patient and his wife that his hemoglobin is persistently low he will likely need to be evaluated in the emergency room for possible blood transfusion, patient and his wife expressed their understanding.  Reviewed ED precautions.  Hypertension: Blood pressure today 110/80.  Patient and his wife note that in recent weeks he has become increasingly dizzy, lightheaded and off-balance.  He notes that dizziness is worse with position changes.  Orthostatic blood pressure show blood pressure decreased from 130/85 supine to 112/78 sitting.  Patient does note  increased dizziness with this.  Will decrease amlodipine to 2.5 mg daily, patient's wife will notify the office if no improvement in symptoms.  Question if related to anemia, check CBC. Orthostatic VS for the past 24 hrs (Last 3 readings):  BP- Lying Pulse- Lying BP- Sitting Pulse- Sitting BP- Standing at 0 minutes Pulse- Standing at 0 minutes BP- Standing at 3 minutes Pulse- Standing at 3 minutes  01/26/24 1550 130/85 82 112/78 96 119/80 97 118/81 97    Palpitations: Patient's wife reports that patient has recently noted that his heart feels as though it is beating fast, lasting for few minutes.  Unclear if there are any associated symptoms as patient does not remember noting palpitations.  Will evaluate with 2-week cardiac monitor.  Check CBC, be met and TSH as noted above.  Magnesium on 01/22/2024 was 2.3.  CVA/TIA: Most recent TIA in February 2024 with MRI/MRI findings of several intracranial circulation abnormalities. Patient notes that he continues to have difficulties with speech and some weakness.  Patient's wife notes that he has been more off balance and has been having some memory as well as mood changes.  Recommend she follow-up with PCP and neurology.  Hyperlipidemia: Last lipid profile on 10/01/2023 indicated total cholesterol 190, HDL 39, triglycerides 211 and LDL 114.  Patient's wife reports that he only takes atorvastatin twice a week, encouraged to start taking daily.  Check fasting lipid profile and LFTs in 8 weeks.  Substance abuse: Patient with history of cocaine abuse.  Today he reports that he does continue to use cocaine, he notes that he did use last week.  Discussed cessation, patient notes that he should try to stop use.   Disposition: F/u with Reather Littler, NP following echocardiogram.  Signed, Rip Harbour, NP

## 2024-01-26 ENCOUNTER — Encounter: Payer: Self-pay | Admitting: Family Medicine

## 2024-01-26 ENCOUNTER — Ambulatory Visit (INDEPENDENT_AMBULATORY_CARE_PROVIDER_SITE_OTHER): Payer: MEDICAID

## 2024-01-26 ENCOUNTER — Ambulatory Visit: Payer: MEDICAID | Attending: Cardiology | Admitting: Cardiology

## 2024-01-26 ENCOUNTER — Encounter: Payer: Self-pay | Admitting: Cardiology

## 2024-01-26 VITALS — BP 110/80 | HR 87 | Ht 78.0 in | Wt 235.0 lb

## 2024-01-26 DIAGNOSIS — N1832 Chronic kidney disease, stage 3b: Secondary | ICD-10-CM

## 2024-01-26 DIAGNOSIS — R0602 Shortness of breath: Secondary | ICD-10-CM

## 2024-01-26 DIAGNOSIS — I5022 Chronic systolic (congestive) heart failure: Secondary | ICD-10-CM

## 2024-01-26 DIAGNOSIS — I129 Hypertensive chronic kidney disease with stage 1 through stage 4 chronic kidney disease, or unspecified chronic kidney disease: Secondary | ICD-10-CM

## 2024-01-26 DIAGNOSIS — G459 Transient cerebral ischemic attack, unspecified: Secondary | ICD-10-CM

## 2024-01-26 DIAGNOSIS — E782 Mixed hyperlipidemia: Secondary | ICD-10-CM

## 2024-01-26 DIAGNOSIS — F1410 Cocaine abuse, uncomplicated: Secondary | ICD-10-CM

## 2024-01-26 DIAGNOSIS — R002 Palpitations: Secondary | ICD-10-CM

## 2024-01-26 MED ORDER — AMLODIPINE BESYLATE 2.5 MG PO TABS: 2.5000 mg | ORAL_TABLET | Freq: Every day | ORAL | 3 refills | Status: DC

## 2024-01-26 NOTE — Patient Instructions (Addendum)
 Medication Instructions:  Stop Cardvedilol Decrease Amlodipine to 2.5 mg once a day *If you need a refill on your cardiac medications before your next appointment, please call your pharmacy*  Lab Work: Today we are going to draw Bmet, TSH, and CBC If you have labs (blood work) drawn today and your tests are completely normal, you will receive your results only by: MyChart Message (if you have MyChart) OR A paper copy in the mail If you have any lab test that is abnormal or we need to change your treatment, we will call you to review the results.  Testing/Procedures: Your physician has requested that you have an echocardiogram. Echocardiography is a painless test that uses sound waves to create images of your heart. It provides your doctor with information about the size and shape of your heart and how well your heart's chambers and valves are working. This procedure takes approximately one hour. There are no restrictions for this procedure. Please do NOT wear cologne, perfume, aftershave, or lotions (deodorant is allowed). Please arrive 15 minutes prior to your appointment time.  Please note: We ask at that you not bring children with you during ultrasound (echo/ vascular) testing. Due to room size and safety concerns, children are not allowed in the ultrasound rooms during exams. Our front office staff cannot provide observation of children in our lobby area while testing is being conducted. An adult accompanying a patient to their appointment will only be allowed in the ultrasound room at the discretion of the ultrasound technician under special circumstances. We apologize for any inconvenience.  ZIO XT- Long Term Monitor Instructions  Your physician has requested you wear a ZIO patch monitor for 14 days.  This is a single patch monitor. [employer] supplies one patch monitor per enrollment. Additional stickers are not available. Please do not apply patch if you will be having a Nuclear Stress  Test,  Echocardiogram, Cardiac CT, MRI, or Chest Xray during the period you would be wearing the  monitor. The patch cannot be worn during these tests. You cannot remove and re-apply the  ZIO XT patch monitor.  Your ZIO patch monitor will be mailed 3 day USPS to your address on file. It may take 3-5 days  to receive your monitor after you have been enrolled.  Once you have received your monitor, please review the enclosed instructions. Your monitor  has already been registered assigning a specific monitor serial # to you.  Billing and Patient Assistance Program Information  We have supplied [employer] with any of your insurance information on file for billing purposes. [employer] offers a sliding scale Patient Assistance Program for patients that do not have  insurance, or whose insurance does not completely cover the cost of the ZIO monitor.  You must apply for the Patient Assistance Program to qualify for this discounted rate.  To apply, please call [employer] at [phone], select option 4, select option 2, ask to apply for  Patient Assistance Program. Meredeth Ide will ask your household income, and how many people  are in your household. They will quote your out-of-pocket cost based on that information.  [employer] will also be able to set up a 52-month, interest-free payment plan if needed.  Applying the monitor   Shave hair from upper left chest.  Hold abrader disc by orange tab. Rub abrader in 40 strokes over the upper left chest as  indicated in your monitor instructions.  Clean area with 4 enclosed alcohol pads. Let dry.  Apply patch as indicated  in monitor instructions. Patch will be placed under collarbone on left  side of chest with arrow pointing upward.  Rub patch adhesive wings for 2 minutes. Remove white label marked "1". Remove the white  label marked "2". Rub patch adhesive wings for 2 additional minutes.  While looking in a mirror, press and release button in center of patch. A  small green light will  flash 3-4 times. This will be your only indicator that the monitor has been turned on.  Do not shower for the first 24 hours. You may shower after the first 24 hours.  Press the button if you feel a symptom. You will hear a small click. Record Date, Time and  Symptom in the Patient Logbook.  When you are ready to remove the patch, follow instructions on the last 2 pages of Patient  Logbook. Stick patch monitor onto the last page of Patient Logbook.  Place Patient Logbook in the blue and white box. Use locking tab on box and tape box closed  securely. The blue and white box has prepaid postage on it. Please place it in the mailbox as  soon as possible. Your physician should have your test results approximately 7 days after the  monitor has been mailed back to [facility].  Call [facility] Customer Care at [phone] if you have questions regarding  your ZIO XT patch monitor. Call them immediately if you see an orange light blinking on your  monitor.  If your monitor falls off in less than 4 days, contact our Monitor department at [phone].  If your monitor becomes loose or falls off after 4 days call [employer] at [phone] for  suggestions on securing your monitor  Follow-Up: At [facility], you and your health needs are our priority.  As part of our continuing mission to provide you with exceptional heart care, we have created designated Provider Care Teams.  These Care Teams include your primary Cardiologist (physician) and Advanced Practice Providers (APPs -  Physician Assistants and Nurse Practitioners) who all work together to provide you with the care you need, when you need it.  We recommend signing up for the patient portal called "MyChart".  Sign up information is provided on this After Visit Summary.  MyChart is used to connect with patients for Virtual Visits (Telemedicine).  Patients are able to view lab/test results, encounter notes,  upcoming appointments, etc.  Non-urgent messages can be sent to your provider as well.   To learn more about what you can do with MyChart, go to [email or web address].    Your next appointment:   After Echo  Provider:   Reather Littler, NP Or any APP  Other Instructions Please take your blood pressure daily for 2 weeks and send in a MyChart message. Please include heart rates. (One message at the end of the 2 weeks).   HOW TO TAKE YOUR BLOOD PRESSURE: Rest 5 minutes before taking your blood pressure. Don't smoke or drink caffeinated beverages for at least 30 minutes before. Take your blood pressure before (not after) you eat. Sit comfortably with your back supported and both feet on the floor (don't cross your legs). Elevate your arm to heart level on a table or a desk. Use the proper sized cuff. It should fit smoothly and snugly around your bare upper arm. There should be enough room to slip a fingertip under the cuff. The bottom edge of the cuff should be 1 inch above the crease of the elbow. Ideally, take 3  measurements at one sitting and record the average.

## 2024-01-26 NOTE — Progress Notes (Unsigned)
 Enrolled patient for a 14 day Zio XT monitor to be mailed to patients home.   [place] to read

## 2024-01-27 ENCOUNTER — Encounter (INDEPENDENT_AMBULATORY_CARE_PROVIDER_SITE_OTHER): Payer: Self-pay | Admitting: *Deleted

## 2024-01-27 ENCOUNTER — Telehealth: Payer: Self-pay | Admitting: Internal Medicine

## 2024-01-27 ENCOUNTER — Emergency Department (HOSPITAL_COMMUNITY)
Admission: EM | Admit: 2024-01-27 | Discharge: 2024-01-27 | Disposition: A | Discharge status: Home / Self Care | Payer: MEDICAID | Attending: Emergency Medicine | Admitting: Emergency Medicine

## 2024-01-27 ENCOUNTER — Encounter (HOSPITAL_COMMUNITY): Payer: Self-pay

## 2024-01-27 ENCOUNTER — Telehealth (HOSPITAL_BASED_OUTPATIENT_CLINIC_OR_DEPARTMENT_OTHER): Payer: Self-pay

## 2024-01-27 ENCOUNTER — Other Ambulatory Visit: Payer: Self-pay

## 2024-01-27 DIAGNOSIS — Z7982 Long term (current) use of aspirin: Secondary | ICD-10-CM | POA: Diagnosis not present

## 2024-01-27 DIAGNOSIS — K921 Melena: Secondary | ICD-10-CM | POA: Insufficient documentation

## 2024-01-27 DIAGNOSIS — R0602 Shortness of breath: Secondary | ICD-10-CM | POA: Diagnosis present

## 2024-01-27 DIAGNOSIS — D649 Anemia, unspecified: Secondary | ICD-10-CM | POA: Insufficient documentation

## 2024-01-27 DIAGNOSIS — I509 Heart failure, unspecified: Secondary | ICD-10-CM | POA: Diagnosis not present

## 2024-01-27 DIAGNOSIS — R195 Other fecal abnormalities: Secondary | ICD-10-CM

## 2024-01-27 LAB — CBC WITH DIFFERENTIAL/PLATELET
Abs Immature Granulocytes: 0.02 10*3/uL (ref 0.00–0.07)
Basophils Absolute: 0 10*3/uL (ref 0.0–0.1)
Basophils Relative: 1 %
Eosinophils Absolute: 0.1 10*3/uL (ref 0.0–0.5)
Eosinophils Relative: 2 %
HCT: 23.7 % — ABNORMAL LOW (ref 39.0–52.0)
Hemoglobin: 6.2 g/dL — CL (ref 13.0–17.0)
Immature Granulocytes: 0 %
Lymphocytes Relative: 18 %
Lymphs Abs: 1.1 10*3/uL (ref 0.7–4.0)
MCH: 17.7 pg — ABNORMAL LOW (ref 26.0–34.0)
MCHC: 26.2 g/dL — ABNORMAL LOW (ref 30.0–36.0)
MCV: 67.7 fL — ABNORMAL LOW (ref 80.0–100.0)
Monocytes Absolute: 0.5 10*3/uL (ref 0.1–1.0)
Monocytes Relative: 8 %
Neutro Abs: 4.1 10*3/uL (ref 1.7–7.7)
Neutrophils Relative %: 71 %
Platelets: 689 10*3/uL — ABNORMAL HIGH (ref 150–400)
RBC: 3.5 MIL/uL — ABNORMAL LOW (ref 4.22–5.81)
RDW: 21.4 % — ABNORMAL HIGH (ref 11.5–15.5)
Smear Review: INCREASED
WBC: 5.8 10*3/uL (ref 4.0–10.5)
nRBC: 0.3 % — ABNORMAL HIGH (ref 0.0–0.2)

## 2024-01-27 LAB — COMPREHENSIVE METABOLIC PANEL
ALT: 12 U/L (ref 0–44)
AST: 14 U/L — ABNORMAL LOW (ref 15–41)
Albumin: 3.7 g/dL (ref 3.5–5.0)
Alkaline Phosphatase: 63 U/L (ref 38–126)
Anion gap: 7 (ref 5–15)
BUN: 25 mg/dL — ABNORMAL HIGH (ref 8–23)
CO2: 26 mmol/L (ref 22–32)
Calcium: 10.9 mg/dL — ABNORMAL HIGH (ref 8.9–10.3)
Chloride: 106 mmol/L (ref 98–111)
Creatinine, Ser: 1.91 mg/dL — ABNORMAL HIGH (ref 0.61–1.24)
GFR, Estimated: 39 mL/min — ABNORMAL LOW (ref >60)
Glucose, Bld: 111 mg/dL — ABNORMAL HIGH (ref 70–99)
Potassium: 4.8 mmol/L (ref 3.5–5.1)
Sodium: 139 mmol/L (ref 135–145)
Total Bilirubin: 0.4 mg/dL (ref 0.0–1.2)
Total Protein: 7.3 g/dL (ref 6.5–8.1)

## 2024-01-27 LAB — CBC
Hematocrit: 24.7 % — ABNORMAL LOW (ref 37.5–51.0)
Hemoglobin: 6.7 g/dL — CL (ref 13.0–17.7)
MCH: 18.2 pg — ABNORMAL LOW (ref 26.6–33.0)
MCHC: 27.1 g/dL — ABNORMAL LOW (ref 31.5–35.7)
MCV: 67 fL — ABNORMAL LOW (ref 79–97)
Platelets: 764 10*3/uL — ABNORMAL HIGH (ref 150–450)
RBC: 3.69 x10E6/uL — ABNORMAL LOW (ref 4.14–5.80)
RDW: 19.8 % — ABNORMAL HIGH (ref 11.6–15.4)
WBC: 8.9 10*3/uL (ref 3.4–10.8)

## 2024-01-27 LAB — BASIC METABOLIC PANEL
BUN/Creatinine Ratio: 13 (ref 10–24)
BUN: 28 mg/dL — ABNORMAL HIGH (ref 8–27)
CO2: 23 mmol/L (ref 20–29)
Calcium: 11.4 mg/dL — ABNORMAL HIGH (ref 8.6–10.2)
Chloride: 105 mmol/L (ref 96–106)
Creatinine, Ser: 2.08 mg/dL — ABNORMAL HIGH (ref 0.76–1.27)
Glucose: 157 mg/dL — ABNORMAL HIGH (ref 70–99)
Potassium: 5.8 mmol/L (ref 3.5–5.2)
Sodium: 139 mmol/L (ref 134–144)
eGFR: 35 mL/min/{1.73_m2} — ABNORMAL LOW (ref >59)

## 2024-01-27 LAB — PREPARE RBC (CROSSMATCH)

## 2024-01-27 LAB — TSH: TSH: 0.575 u[IU]/mL (ref 0.450–4.500)

## 2024-01-27 MED ORDER — SODIUM CHLORIDE 0.9% IV SOLUTION: Freq: Once | INTRAVENOUS | Status: DC

## 2024-01-27 NOTE — Discharge Instructions (Signed)
 You were seen in the emergency department for low blood counts.  You were given a unit of red blood cells.  You tested positive for blood in your stool.  You will likely need a colonoscopy.  Please follow-up with your GI doctor to arrange this.  Also have your primary care doctor recheck your blood count soon to make sure that stabilizes.  Return if any worsening or concerning symptoms

## 2024-01-27 NOTE — Telephone Encounter (Signed)
 Left message to call back

## 2024-01-27 NOTE — Telephone Encounter (Signed)
-----   Message from [facility] sent at 01/27/2024 12:06 PM EST ----- Please call Raymond Barrera and his wife, his hemoglobin is low at 6.7 and his potassium is high at 5.8. He needs to go to the emergency room for lab recheck and possibly a blood transfusion. They were contacted last night by on call service but does not appear they presented to the ED after the call.

## 2024-01-27 NOTE — ED Provider Notes (Signed)
 [place] EMERGENCY DEPARTMENT AT [facility] Provider Note   CSN: [id number] Arrival date & time: 01/27/24  1445     History {Add pertinent medical, surgical, social history, OB history to HPI:1} Chief Complaint  Patient presents with  . blood transfusion    Gerron Guidotti. is a 63 y.o. male.  He is a patient with chronic anemia, congestive heart failure, stroke.  He has some residual aphasia and his wife is helping with history.  He had some lab work done yesterday and they found his hemoglobin was critically low.  They recommended he come to the emergency department for further evaluation.  He is chronically anemic but has never required transfusion in the past.  Reportedly potassium was also elevated at that time.  Wife said she has noticed he has been more fatigued and gets short of breath with exertion.  Is also had more aches and pains.  Denies any overt bleeding no dark stools.  The history is provided by the patient and the spouse.  Weakness Severity:  Moderate Onset quality:  Gradual Progression:  Unchanged Chronicity:  New Relieved by:  Rest Worsened by:  Activity Associated symptoms: difficulty walking and shortness of breath   Associated symptoms: no abdominal pain, no fever, no hematochezia, no melena, no syncope and no vomiting        Home Medications Prior to Admission medications   Medication Sig Start Date End Date Taking? Authorizing Provider  Accu-Chek Softclix Lancets lancets SMARTSIG:Topical 2-3 Times Daily 05/15/23   [provider]  albuterol (VENTOLIN HFA) 108 (90 Base) MCG/ACT inhaler Inhale 2 puffs into the lungs every 6 (six) hours as needed for wheezing or shortness of breath. 01/22/24   Del Newman Nip, Tenna Child, FNP  amLODipine (NORVASC) 2.5 MG tablet Take 1 tablet (2.5 mg total) by mouth daily. 01/26/24 04/25/24  Reather Littler D, NP  aspirin EC 81 MG tablet Take 81 mg by mouth daily. Swallow whole.    [provider]   atorvastatin (LIPITOR) 40 MG tablet Take 1 tablet (40 mg total) by mouth daily. 01/01/24   Anabel Halon, MD  carvedilol (COREG) 3.125 MG tablet Take 3.125 mg by mouth 2 (two) times daily with a meal.    [provider]  clopidogrel (PLAVIX) 75 MG tablet Take 1 tablet (75 mg total) by mouth daily. 01/01/24   Anabel Halon, MD  gabapentin (NEURONTIN) 400 MG capsule Take 1 capsule (400 mg total) by mouth 2 (two) times daily. 01/22/24   Del Nigel Berthold, FNP  JARDIANCE 10 MG TABS tablet Take 1 tablet (10 mg total) by mouth daily. 01/01/24   Anabel Halon, MD  lidocaine (LIDODERM) 5 % Place 1 patch onto the skin daily. Remove & Discard patch within 12 hours or as directed by MD 07/10/19   Aviva Kluver B, PA-C  rOPINIRole (REQUIP) 1 MG tablet Take 1 tablet (1 mg total) by mouth at bedtime. 01/01/24   Anabel Halon, MD  umeclidinium-vilanterol (ANORO ELLIPTA) 62.5-25 MCG/ACT AEPB Inhale 1 puff into the lungs daily. 10/14/23   Glenford Bayley, NP  umeclidinium-vilanterol [facility] ELLIPTA) 62.5-25 MCG/ACT AEPB Inhale 1 puff into the lungs daily. 12/04/23   Glenford Bayley, NP      Allergies    Poison ivy extract [poison ivy extract] and Coreg [carvedilol]    Review of Systems   Review of Systems  Constitutional:  Negative for fever.  Respiratory:  Positive for shortness of breath.  Cardiovascular:  Negative for syncope.  Gastrointestinal:  Negative for abdominal pain, hematochezia, melena and vomiting.  Neurological:  Positive for weakness.    Physical Exam Updated Vital Signs BP (!) 120/94 (BP Location: Right Arm)   Pulse 83   Temp 98.4 F (36.9 C) (Temporal)   Resp 18   Ht 6\' 6"  (1.981 m)   Wt 107 kg   SpO2 98%   BMI 27.26 kg/m  Physical Exam Vitals and nursing note reviewed.  Constitutional:      General: He is not in acute distress.    Appearance: Normal appearance. He is well-developed.  HENT:     Head: Normocephalic and atraumatic.  Eyes:      Conjunctiva/sclera: Conjunctivae normal.  Cardiovascular:     Rate and Rhythm: Normal rate and regular rhythm.     Heart sounds: No murmur heard. Pulmonary:     Effort: Pulmonary effort is normal. No respiratory distress.     Breath sounds: Normal breath sounds.  Abdominal:     Palpations: Abdomen is soft.     Tenderness: There is no abdominal tenderness. There is no guarding or rebound.  Musculoskeletal:        General: No swelling.     Cervical back: Neck supple.  Skin:    General: Skin is warm and dry.     Capillary Refill: Capillary refill takes less than 2 seconds.  Neurological:     Mental Status: He is alert.     Comments: He has some aphasia with difficult to interpret speech.  His thought content seems intact though.      ED Results / Procedures / Treatments   Labs (all labs ordered are listed, but only abnormal results are displayed) Labs Reviewed  CBC WITH DIFFERENTIAL/PLATELET - Abnormal; Notable for the following components:      Result Value   RBC 3.50 (*)    Hemoglobin 6.2 (*)    HCT 23.7 (*)    MCV 67.7 (*)    MCH 17.7 (*)    MCHC 26.2 (*)    RDW 21.4 (*)    Platelets 689 (*)    nRBC 0.3 (*)    All other components within normal limits  COMPREHENSIVE METABOLIC PANEL - Abnormal; Notable for the following components:   Glucose, Bld 111 (*)    BUN 25 (*)    Creatinine, Ser 1.91 (*)    Calcium 10.9 (*)    AST 14 (*)    GFR, Estimated 39 (*)    All other components within normal limits  TYPE AND SCREEN    EKG None  Radiology No results found.  Procedures Procedures  {Document cardiac monitor, telemetry assessment procedure when appropriate:1}  Medications Ordered in ED Medications  0.9 %  sodium chloride infusion (Manually program via Guardrails IV Fluids) (has no administration in time range)    ED Course/ Medical Decision Making/ A&P Clinical Course as of 01/27/24 1649  Wed Jan 27, 2024  1648 Patient consents to transfusion verbally [MB]     Clinical Course User Index [MB] Terrilee Files, MD   {   Click here for ABCD2, HEART and other calculatorsREFRESH Note before signing :1}                              Medical Decision Making Amount and/or Complexity of Data Reviewed Labs: ordered.  Risk Prescription drug management.   This patient complains of ***; this involves  an extensive number of treatment Options and is a complaint that carries with it a high risk of complications and morbidity. The differential includes ***  I ordered, reviewed and interpreted labs, which included *** I ordered medication *** and reviewed PMP when indicated. I ordered imaging studies which included *** and I independently    visualized and interpreted imaging which showed *** Additional history obtained from *** Previous records obtained and reviewed *** I consulted *** and discussed lab and imaging findings and discussed disposition.  Cardiac monitoring reviewed, *** Social determinants considered, *** Critical Interventions: ***  After the interventions stated above, I reevaluated the patient and found *** Admission and further testing considered, ***   {Document critical care time when appropriate:1} {Document review of labs and clinical decision tools ie heart score, Chads2Vasc2 etc:1}  {Document your independent review of radiology images, and any outside records:1} {Document your discussion with family members, caretakers, and with consultants:1} {Document social determinants of health affecting pt's care:1} {Document your decision making why or why not admission, treatments were needed:1} Final Clinical Impression(s) / ED Diagnoses Final diagnoses:  None    Rx / DC Orders ED Discharge Orders     None

## 2024-01-27 NOTE — Telephone Encounter (Signed)
 Critical result called from the lab.  Hemoglobin 6.7.  Called the patient and spoke with his wife.  Patient was available on speaker.  Recommended they come to the emergency department for repeat and unit of blood.  They said they cannot come tonight, but will try and come in the morning.  Will tag the ordering provider for further management.   Victory Dakin, MD  Cardiology

## 2024-01-27 NOTE — Telephone Encounter (Signed)
 Alerted to a potassium of 5.8.  I have already instructed patient to come to the emergency department.  Called the patient and reinforced the importance of getting to an emergency department to have these values remeasured and managed.  They expressed understanding and are planning on going to [facility] in the AM.

## 2024-01-27 NOTE — ED Triage Notes (Signed)
 Pt arrived via POV reporting a phone call from his doctor who advised the Pt to come to the ER for a blood draw/recheck of his K= and Hgb. Per Pts note in his chart, Pts Hgb is 6.7 and K+ 5.8. Pt endorses recent weakness and fatigue.

## 2024-01-28 LAB — BPAM RBC
Blood Product Expiration Date: 202503212359
ISSUE DATE / TIME: 202502261720
Unit Type and Rh: 5100

## 2024-01-28 LAB — TYPE AND SCREEN
ABO/RH(D): O POS
Antibody Screen: NEGATIVE
Unit division: 0

## 2024-02-19 ENCOUNTER — Ambulatory Visit (HOSPITAL_COMMUNITY): Payer: MEDICAID | Attending: Cardiology

## 2024-02-22 ENCOUNTER — Ambulatory Visit: Payer: MEDICAID | Admitting: Primary Care

## 2024-02-25 ENCOUNTER — Ambulatory Visit (INDEPENDENT_AMBULATORY_CARE_PROVIDER_SITE_OTHER): Admitting: Internal Medicine

## 2024-02-25 ENCOUNTER — Encounter: Payer: Self-pay | Admitting: Internal Medicine

## 2024-02-25 VITALS — BP 129/85 | HR 90 | Ht 78.0 in | Wt 241.2 lb

## 2024-02-25 DIAGNOSIS — K2951 Unspecified chronic gastritis with bleeding: Secondary | ICD-10-CM | POA: Diagnosis not present

## 2024-02-25 DIAGNOSIS — G6289 Other specified polyneuropathies: Secondary | ICD-10-CM | POA: Diagnosis not present

## 2024-02-25 DIAGNOSIS — R42 Dizziness and giddiness: Secondary | ICD-10-CM | POA: Diagnosis not present

## 2024-02-25 DIAGNOSIS — Z7984 Long term (current) use of oral hypoglycemic drugs: Secondary | ICD-10-CM

## 2024-02-25 DIAGNOSIS — E1169 Type 2 diabetes mellitus with other specified complication: Secondary | ICD-10-CM | POA: Diagnosis not present

## 2024-02-25 DIAGNOSIS — D500 Iron deficiency anemia secondary to blood loss (chronic): Secondary | ICD-10-CM

## 2024-02-25 MED ORDER — GABAPENTIN 300 MG PO CAPS: 300.0000 mg | ORAL_CAPSULE | Freq: Every day | ORAL | 1 refills | Status: DC

## 2024-02-25 MED ORDER — DULOXETINE HCL 30 MG PO CPEP
30.0000 mg | ORAL_CAPSULE | Freq: Every day | ORAL | 0 refills | Status: DC
Start: 2024-02-25 — End: 2024-02-29

## 2024-02-25 MED ORDER — PANTOPRAZOLE SODIUM 40 MG PO TBEC
40.0000 mg | DELAYED_RELEASE_TABLET | Freq: Every day | ORAL | 2 refills | Status: DC
Start: 2024-02-25 — End: 2024-03-03

## 2024-02-25 NOTE — Patient Instructions (Addendum)
 Please start taking Gabapentin 300 mg at bedtime and Cymbalta 30 mg once daily in the morning.  Please continue taking other medications as prescribed.  Please contact GI office for colonoscopy -  [phone].

## 2024-02-25 NOTE — Assessment & Plan Note (Signed)
 Decrease dose of gabapentin to 300 mg nightly only due to days denies and hallucinations Added Cymbalta 30 mg QD for peripheral neuropathy

## 2024-02-25 NOTE — Progress Notes (Signed)
 Acute Office Visit  Subjective:    Patient ID: Raymond Barrera., male    DOB: Oct 25, 1961, 63 y.o.   MRN: [id number]  Chief Complaint  Patient presents with   Care Management    Pts. Wife states his gabapentin may be too strong and that he is dizzy, and off balance and a sometimes hallucinating and hearing voices, and ans has become a big concern for her and his mother    HPI Patient is in today for complaint of dizziness since increasing dose of gabapentin to 400 mg twice daily by NP.  He also has had auditory hallucinations at times.  He was previously taking gabapentin 200 mg nightly, but was having persistent leg pain.  He has longstanding history of peripheral neuropathy.  Of note, he also has a history of chronic blood loss anemia, had PRBC transfusion in 02/25 and was supposed to have GI follow up. He has not scheduled GI follow up yet.  He denies any melena or hematochezia currently.  Past Medical History:  Diagnosis Date   Ambulates with cane    straight - uses occasionally   Anxiety    due to the stroke   Arthritis    Back pain    hx of buldging disc   Complication of anesthesia    took a while for him to wake up after previous anesthesia   CVA (cerebral vascular accident) (HCC) 10/07/2020   Diabetes mellitus without complication (HCC)    Family history of adverse reaction to anesthesia    "sometimes mom has a hard time waking up"   High cholesterol    takes Zocor daily   Hypertension    takes Benazepril and HCTZ  daily   Joint pain    Joint swelling    Memory impairment    occassional - from stroke   Myocardial infarction (HCC) 1987   Pneumonia    hx of-80's   Shortness of breath dyspnea    do to pain   Sleep apnea    never had a sleep study,but states Dr. Janna Arch says he has it   Slurred speech    Stroke (HCC) 08/2013   7 mini-strokes, last stroke 2017   TIA (transient ischemic attack) 2014   x 7    Urinary frequency     Past Surgical History:   Procedure Laterality Date   ABDOMINAL EXPOSURE N/A 06/16/2018   Procedure: ABDOMINAL EXPOSURE;  Surgeon: Larina Earthly, MD;  [facility];  Service: Vascular;  Laterality: N/A;   ANKLE SURGERY  2008   left ankle-otif-[facility]   ANTERIOR LUMBAR FUSION Bilateral 06/16/2018   Procedure: LUMBAR 4-5 LUMBAR 5-SACRUM 1 ANTERIOR LUMBAR INTERBODY FUSION WITH INSTRUMENTATION AND ALLOGRAFT;  Surgeon: Estill Bamberg, MD;  Location: [facility] OR;  Service: Orthopedics;  Laterality: Bilateral;   BACK SURGERY     HEMATOMA EVACUATION Left 08/13/2018   Procedure: EVACUATION HEMATOMA LEFT ABDOMINAL WALL;  Surgeon: Larina Earthly, MD;  Location: [facility] OR;  Service: Vascular;  Laterality: Left;   IR RADIOLOGIST EVAL & MGMT  07/27/2018   IR US GUIDE BX ASP/DRAIN  07/14/2018   JOINT REPLACEMENT     both hips replaced    LUMBAR LAMINECTOMY/DECOMPRESSION MICRODISCECTOMY Right 10/17/2014   Procedure: LUMBAR LAMINECTOMY/DECOMPRESSION MICRODISCECTOMY 2 LEVELS;  Surgeon: Lisbeth Renshaw, MD;  Location: [facility] NEURO ORS;  Service: Neurosurgery;  Laterality: Right;  Right L45 L5S1 laminectomy and foraminotomy   MASS EXCISION  09/13/2012   Procedure: EXCISION MASS;  Surgeon: Loraine Leriche  Val Riles, MD;  Location: [facility] ORS;  Service: General;  Laterality: N/A;   ROBOTIC ASSITED PARTIAL NEPHRECTOMY Left 03/30/2019   Procedure: XI ROBOTIC ASSITED PARTIAL NEPHRECTOMY POSSIBLE RADICAL NEPHRECTOMY;  Surgeon: Rene Paci, MD;  Location: [facility] ORS;  Service: Urology;  Laterality: Left;   SHOULDER ARTHROSCOPY WITH ROTATOR CUFF REPAIR Right 04/12/2020   Procedure: right shoulder arthroscopy, debridement, mini open rotator cuff tear repair;  Surgeon: Cammy Copa, MD;  Location: [facility] OR;  Service: Orthopedics;  Laterality: Right;   TONSILLECTOMY     TOTAL HIP ARTHROPLASTY Right 03/20/2015   TOTAL HIP ARTHROPLASTY Right 03/20/2015   Procedure: TOTAL HIP ARTHROPLASTY ANTERIOR APPROACH;  Surgeon: Sheral Apley, MD;  Location: [facility] OR;  Service:  Orthopedics;  Laterality: Right;   TOTAL HIP ARTHROPLASTY Left 01/08/2016   Procedure: TOTAL HIP ARTHROPLASTY ANTERIOR APPROACH;  Surgeon: Sheral Apley, MD;  Location: [facility] OR;  Service: Orthopedics;  Laterality: Left;    Family History  Problem Relation Age of Onset   Heart disease Father     Social History   Socioeconomic History   Marital status: Married    Spouse name: [name] C.  Tisdale   Number of children: Not on file   Years of education: Not on file   Highest education level: 12th grade  Occupational History   Not on file  Tobacco Use   Smoking status: Some Days    Current packs/day: 0.25    Average packs/day: 0.3 packs/day for 47.0 years (11.8 ttl pk-yrs)    Types: Cigarettes    Passive exposure: Current   Smokeless tobacco: Never   Tobacco comments:    Currently smokes about 2 cigarettes per day.  07/14/2023 hfb  Vaping Use   Vaping status: Never Used  Substance and Sexual Activity   Alcohol use: Not Currently    Comment: quit 2012   Drug use: Not Currently    Types: Cocaine    Comment: many yrs ago., last time- late 2016   Sexual activity: Yes  Other Topics Concern   Not on file  Social History Narrative   Are you right handed or left handed? Right   Are you currently employed ? disabled   What is your current occupation?   Do you live at home alone? NO   Who lives with you? Mother, Wife   What type of home do you live in: 1 story or 2 story? 1       Social Drivers of Corporate investment banker Strain: Medium Risk (12/28/2023)   Overall Financial Resource Strain (CARDIA)    Difficulty of Paying Living Expenses: Somewhat hard  Food Insecurity: Food Insecurity Present (12/28/2023)   Hunger Vital Sign    Worried About Running Out of Food in the Last Year: Sometimes true    Ran Out of Food in the Last Year: Sometimes true  Transportation Needs: Unmet Transportation Needs (12/28/2023)   PRAPARE - Administrator, Civil Service (Medical): Yes     Lack of Transportation (Non-Medical): Yes  Physical Activity: Insufficiently Active (12/28/2023)   Exercise Vital Sign    Days of Exercise per Week: 1 day    Minutes of Exercise per Session: 10 min  Stress: Stress Concern Present (12/28/2023)   [employer] of Occupational Health - Occupational Stress Questionnaire    Feeling of Stress : To some extent  Social Connections: Moderately Integrated (12/28/2023)   Social Connection and Isolation Panel [NHANES]    Frequency of Communication  with Friends and Family: Once a week    Frequency of Social Gatherings with Friends and Family: Once a week    Attends Religious Services: More than 4 times per year    Active Member of [employer] or Organizations: Yes    Attends Engineer, structural: More than 4 times per year    Marital Status: Married  Catering manager Violence: Not At Risk (01/13/2023)   Humiliation, Afraid, Rape, and Kick questionnaire    Fear of Current or Ex-Partner: No    Emotionally Abused: No    Physically Abused: No    Sexually Abused: No    Outpatient Medications Prior to Visit  Medication Sig Dispense Refill   Accu-Chek Softclix Lancets lancets SMARTSIG:Topical 2-3 Times Daily     albuterol (VENTOLIN HFA) 108 (90 Base) MCG/ACT inhaler Inhale 2 puffs into the lungs every 6 (six) hours as needed for wheezing or shortness of breath. 8 g 2   amLODipine (NORVASC) 2.5 MG tablet Take 1 tablet (2.5 mg total) by mouth daily. 90 tablet 3   aspirin EC 81 MG tablet Take 81 mg by mouth daily. Swallow whole.     atorvastatin (LIPITOR) 40 MG tablet Take 1 tablet (40 mg total) by mouth daily. 90 tablet 1   clopidogrel (PLAVIX) 75 MG tablet Take 1 tablet (75 mg total) by mouth daily. 30 tablet 11   JARDIANCE 10 MG TABS tablet Take 1 tablet (10 mg total) by mouth daily. 30 tablet 3   lidocaine (LIDODERM) 5 % Place 1 patch onto the skin daily. Remove & Discard patch within 12 hours or as directed by MD 30 patch 0   rOPINIRole (REQUIP) 1  MG tablet Take 1 tablet (1 mg total) by mouth at bedtime. 90 tablet 1   umeclidinium-vilanterol (ANORO ELLIPTA) 62.5-25 MCG/ACT AEPB Inhale 1 puff into the lungs daily. 60 each 3   gabapentin (NEURONTIN) 400 MG capsule Take 1 capsule (400 mg total) by mouth 2 (two) times daily. 60 capsule 2   carvedilol (COREG) 3.125 MG tablet Take 3.125 mg by mouth 2 (two) times daily with a meal. (Patient not taking: Reported on 02/25/2024)     umeclidinium-vilanterol (ANORO ELLIPTA) 62.5-25 MCG/ACT AEPB Inhale 1 puff into the lungs daily. (Patient not taking: Reported on 02/25/2024) 3 each 3   No facility-administered medications prior to visit.    Allergies  Allergen Reactions   Poison Ivy Extract [Poison Ivy Extract] Itching and Rash   Coreg [Carvedilol] Itching    Itching and dizziness    Review of Systems  Constitutional:  Negative for chills and fever.  HENT:  Negative for congestion and sore throat.   Eyes:  Negative for pain and discharge.  Respiratory:  Positive for shortness of breath (Intermittent). Negative for cough.   Cardiovascular:  Negative for chest pain and palpitations.  Gastrointestinal:  Negative for diarrhea, nausea and vomiting.  Endocrine: Negative for polydipsia and polyuria.  Genitourinary:  Negative for dysuria and hematuria.  Musculoskeletal:  Positive for back pain and gait problem. Negative for neck pain and neck stiffness.  Skin:  Negative for rash.  Neurological:  Positive for dizziness, speech difficulty and weakness (RUE and RLE).  Psychiatric/Behavioral:  Negative for agitation and behavioral problems. The patient is nervous/anxious.        Objective:    Physical Exam Vitals reviewed.  Constitutional:      General: He is not in acute distress.    Appearance: He is not diaphoretic.  HENT:  Head: Normocephalic and atraumatic.     Nose: Nose normal.     Mouth/Throat:     Mouth: Mucous membranes are moist.  Eyes:     General: No scleral icterus.     Extraocular Movements: Extraocular movements intact.  Cardiovascular:     Rate and Rhythm: Normal rate and regular rhythm.     Heart sounds: Normal heart sounds. No murmur heard. Pulmonary:     Breath sounds: Normal breath sounds. No wheezing or rales.  Musculoskeletal:     Cervical back: Neck supple. No tenderness.     Right lower leg: No edema.     Left lower leg: No edema.  Skin:    General: Skin is warm.     Findings: No rash.  Neurological:     General: No focal deficit present.     Mental Status: He is alert and oriented to person, place, and time.     Cranial Nerves: No cranial nerve deficit.     Motor: Weakness (RUE and RLE - 4/5) present.     Comments: Expressive aphasia -improved  Psychiatric:        Mood and Affect: Mood normal.        Behavior: Behavior normal.     BP 129/85 (BP Location: Left Arm, Patient Position: Sitting, Cuff Size: Normal)   Pulse 90   Ht 6\' 6"  (1.981 m)   Wt 241 lb 3.2 oz (109.4 kg)   SpO2 96%   BMI 27.87 kg/m  Wt Readings from Last 3 Encounters:  02/25/24 241 lb 3.2 oz (109.4 kg)  01/27/24 235 lb 14.3 oz (107 kg)  01/26/24 235 lb (106.6 kg)        Assessment & Plan:   Problem List Items Addressed This Visit       Digestive   Chronic gastritis with bleeding   Added pantoprazole for chronic gastritis Needs GI evaluation for occult GI bleeding Currently takes Plavix for history of TIA Avoid hot and spicy food      Relevant Medications   pantoprazole (PROTONIX) 40 MG tablet     Endocrine   Type 2 diabetes mellitus with other specified complication (HCC)   Lab Results  Component Value Date   HGBA1C 6.8 (H) 01/01/2024   Well controlled Associated with HTN, HLD and h/o CVA On Jardiance 10 mg QD, needs to be compliant Advised to follow diabetic diet Needs to be on statin, on ACEi -restart atorvastatin F/u CMP, HbA1c and lipid panel Diabetic eye exam: Advised to follow up with Ophthalmology for diabetic eye exam          Nervous and Auditory   Peripheral neuropathy - Primary   Decrease dose of gabapentin to 300 mg nightly only due to days denies and hallucinations Added Cymbalta 30 mg QD for peripheral neuropathy      Relevant Medications   gabapentin (NEURONTIN) 300 MG capsule   DULoxetine (CYMBALTA) 30 MG capsule     Other   Iron deficiency anemia due to chronic blood loss   Hb: ~8 usually, had PRBC transfusion in 02/25 Likely due to occult GI bleeding Needs GI evaluation for occult GI bleeding - needs to refrain from using substances to get EGD and colonoscopy scheduled Check CBC, iron profile, folic acid, B12 Needs to start iron supplement Added pantoprazole for chronic gastritis      Relevant Orders   CBC with Differential/Platelet   Fe+TIBC+Fer   Dizziness   Likely due to recently increased dose of gabapentin Chronic  blood loss anemia can be the reason as well, needs GI follow-up Maintain adequate hydration and eat at regular intervals        Meds ordered this encounter  Medications   gabapentin (NEURONTIN) 300 MG capsule    Sig: Take 1 capsule (300 mg total) by mouth at bedtime.    Dispense:  90 capsule    Refill:  1   DULoxetine (CYMBALTA) 30 MG capsule    Sig: Take 1 capsule (30 mg total) by mouth daily.    Dispense:  90 capsule    Refill:  0   pantoprazole (PROTONIX) 40 MG tablet    Sig: Take 1 tablet (40 mg total) by mouth daily.    Dispense:  30 tablet    Refill:  2     Britiny Defrain Concha Se, MD

## 2024-02-25 NOTE — Assessment & Plan Note (Signed)
 Lab Results  Component Value Date   HGBA1C 6.8 (H) 01/01/2024   Well controlled Associated with HTN, HLD and h/o CVA On Jardiance 10 mg QD, needs to be compliant Advised to follow diabetic diet Needs to be on statin, on ACEi -restart atorvastatin F/u CMP, HbA1c and lipid panel Diabetic eye exam: Advised to follow up with Ophthalmology for diabetic eye exam

## 2024-02-25 NOTE — Assessment & Plan Note (Signed)
 Likely due to recently increased dose of gabapentin Chronic blood loss anemia can be the reason as well, needs GI follow-up Maintain adequate hydration and eat at regular intervals

## 2024-02-25 NOTE — Assessment & Plan Note (Signed)
 Added pantoprazole for chronic gastritis Needs GI evaluation for occult GI bleeding Currently takes Plavix for history of TIA Avoid hot and spicy food

## 2024-02-25 NOTE — Assessment & Plan Note (Signed)
 Hb: ~8 usually, had PRBC transfusion in 02/25 Likely due to occult GI bleeding Needs GI evaluation for occult GI bleeding - needs to refrain from using substances to get EGD and colonoscopy scheduled Check CBC, iron profile, folic acid, B12 Needs to start iron supplement Added pantoprazole for chronic gastritis

## 2024-02-29 ENCOUNTER — Emergency Department (HOSPITAL_COMMUNITY)

## 2024-02-29 ENCOUNTER — Ambulatory Visit: Payer: Self-pay

## 2024-02-29 ENCOUNTER — Other Ambulatory Visit: Payer: Self-pay

## 2024-02-29 ENCOUNTER — Encounter (INDEPENDENT_AMBULATORY_CARE_PROVIDER_SITE_OTHER): Payer: Self-pay | Admitting: *Deleted

## 2024-02-29 ENCOUNTER — Encounter (HOSPITAL_COMMUNITY): Payer: Self-pay

## 2024-02-29 ENCOUNTER — Observation Stay (HOSPITAL_COMMUNITY)
Admission: EM | Admit: 2024-02-29 | Discharge: 2024-03-03 | Disposition: A | Discharge status: Home / Self Care | Attending: Family Medicine | Admitting: Family Medicine

## 2024-02-29 ENCOUNTER — Encounter (HOSPITAL_BASED_OUTPATIENT_CLINIC_OR_DEPARTMENT_OTHER): Payer: Self-pay

## 2024-02-29 DIAGNOSIS — I5022 Chronic systolic (congestive) heart failure: Secondary | ICD-10-CM | POA: Diagnosis not present

## 2024-02-29 DIAGNOSIS — K449 Diaphragmatic hernia without obstruction or gangrene: Secondary | ICD-10-CM | POA: Insufficient documentation

## 2024-02-29 DIAGNOSIS — G3189 Other specified degenerative diseases of nervous system: Secondary | ICD-10-CM | POA: Diagnosis not present

## 2024-02-29 DIAGNOSIS — E1142 Type 2 diabetes mellitus with diabetic polyneuropathy: Secondary | ICD-10-CM | POA: Diagnosis not present

## 2024-02-29 DIAGNOSIS — E782 Mixed hyperlipidemia: Secondary | ICD-10-CM

## 2024-02-29 DIAGNOSIS — Z96643 Presence of artificial hip joint, bilateral: Secondary | ICD-10-CM | POA: Diagnosis not present

## 2024-02-29 DIAGNOSIS — J9811 Atelectasis: Secondary | ICD-10-CM | POA: Diagnosis not present

## 2024-02-29 DIAGNOSIS — D649 Anemia, unspecified: Secondary | ICD-10-CM | POA: Diagnosis not present

## 2024-02-29 DIAGNOSIS — N189 Chronic kidney disease, unspecified: Secondary | ICD-10-CM | POA: Diagnosis present

## 2024-02-29 DIAGNOSIS — K648 Other hemorrhoids: Secondary | ICD-10-CM | POA: Insufficient documentation

## 2024-02-29 DIAGNOSIS — K2951 Unspecified chronic gastritis with bleeding: Secondary | ICD-10-CM

## 2024-02-29 DIAGNOSIS — K3189 Other diseases of stomach and duodenum: Secondary | ICD-10-CM | POA: Insufficient documentation

## 2024-02-29 DIAGNOSIS — Z7982 Long term (current) use of aspirin: Secondary | ICD-10-CM | POA: Diagnosis not present

## 2024-02-29 DIAGNOSIS — R42 Dizziness and giddiness: Secondary | ICD-10-CM

## 2024-02-29 DIAGNOSIS — J449 Chronic obstructive pulmonary disease, unspecified: Secondary | ICD-10-CM | POA: Diagnosis present

## 2024-02-29 DIAGNOSIS — Z79899 Other long term (current) drug therapy: Secondary | ICD-10-CM | POA: Diagnosis not present

## 2024-02-29 DIAGNOSIS — S0990XA Unspecified injury of head, initial encounter: Secondary | ICD-10-CM | POA: Diagnosis not present

## 2024-02-29 DIAGNOSIS — N183 Chronic kidney disease, stage 3 unspecified: Secondary | ICD-10-CM | POA: Insufficient documentation

## 2024-02-29 DIAGNOSIS — D509 Iron deficiency anemia, unspecified: Secondary | ICD-10-CM | POA: Diagnosis not present

## 2024-02-29 DIAGNOSIS — G629 Polyneuropathy, unspecified: Secondary | ICD-10-CM

## 2024-02-29 DIAGNOSIS — R9389 Abnormal findings on diagnostic imaging of other specified body structures: Secondary | ICD-10-CM | POA: Diagnosis not present

## 2024-02-29 DIAGNOSIS — D123 Benign neoplasm of transverse colon: Secondary | ICD-10-CM | POA: Diagnosis not present

## 2024-02-29 DIAGNOSIS — R531 Weakness: Secondary | ICD-10-CM | POA: Diagnosis not present

## 2024-02-29 DIAGNOSIS — Z7902 Long term (current) use of antithrombotics/antiplatelets: Secondary | ICD-10-CM | POA: Insufficient documentation

## 2024-02-29 DIAGNOSIS — D500 Iron deficiency anemia secondary to blood loss (chronic): Secondary | ICD-10-CM | POA: Diagnosis present

## 2024-02-29 DIAGNOSIS — I130 Hypertensive heart and chronic kidney disease with heart failure and stage 1 through stage 4 chronic kidney disease, or unspecified chronic kidney disease: Secondary | ICD-10-CM | POA: Diagnosis not present

## 2024-02-29 DIAGNOSIS — R918 Other nonspecific abnormal finding of lung field: Secondary | ICD-10-CM | POA: Diagnosis not present

## 2024-02-29 DIAGNOSIS — I6782 Cerebral ischemia: Secondary | ICD-10-CM | POA: Diagnosis not present

## 2024-02-29 DIAGNOSIS — R7989 Other specified abnormal findings of blood chemistry: Secondary | ICD-10-CM | POA: Insufficient documentation

## 2024-02-29 DIAGNOSIS — G9389 Other specified disorders of brain: Secondary | ICD-10-CM | POA: Diagnosis not present

## 2024-02-29 DIAGNOSIS — E785 Hyperlipidemia, unspecified: Secondary | ICD-10-CM | POA: Diagnosis present

## 2024-02-29 DIAGNOSIS — F17210 Nicotine dependence, cigarettes, uncomplicated: Secondary | ICD-10-CM | POA: Insufficient documentation

## 2024-02-29 DIAGNOSIS — I10 Essential (primary) hypertension: Secondary | ICD-10-CM | POA: Diagnosis present

## 2024-02-29 DIAGNOSIS — Z8673 Personal history of transient ischemic attack (TIA), and cerebral infarction without residual deficits: Secondary | ICD-10-CM

## 2024-02-29 DIAGNOSIS — E1169 Type 2 diabetes mellitus with other specified complication: Secondary | ICD-10-CM | POA: Diagnosis present

## 2024-02-29 LAB — HEMOGLOBIN AND HEMATOCRIT, BLOOD
HCT: 25.1 % — ABNORMAL LOW (ref 39.0–52.0)
Hemoglobin: 6.9 g/dL — CL (ref 13.0–17.0)

## 2024-02-29 LAB — CBC WITH DIFFERENTIAL/PLATELET
Abs Immature Granulocytes: 0.03 10*3/uL (ref 0.00–0.07)
Basophils Absolute: 0 10*3/uL (ref 0.0–0.1)
Basophils Relative: 0 %
Eosinophils Absolute: 0 10*3/uL (ref 0.0–0.5)
Eosinophils Relative: 0 %
HCT: 26.6 % — ABNORMAL LOW (ref 39.0–52.0)
Hemoglobin: 6.9 g/dL — CL (ref 13.0–17.0)
Immature Granulocytes: 0 %
Lymphocytes Relative: 11 %
Lymphs Abs: 1.1 10*3/uL (ref 0.7–4.0)
MCH: 17.8 pg — ABNORMAL LOW (ref 26.0–34.0)
MCHC: 25.9 g/dL — ABNORMAL LOW (ref 30.0–36.0)
MCV: 68.6 fL — ABNORMAL LOW (ref 80.0–100.0)
Monocytes Absolute: 0.6 10*3/uL (ref 0.1–1.0)
Monocytes Relative: 6 %
Neutro Abs: 7.7 10*3/uL (ref 1.7–7.7)
Neutrophils Relative %: 83 %
Platelets: 622 10*3/uL — ABNORMAL HIGH (ref 150–400)
RBC: 3.88 MIL/uL — ABNORMAL LOW (ref 4.22–5.81)
RDW: 23.9 % — ABNORMAL HIGH (ref 11.5–15.5)
WBC: 9.4 10*3/uL (ref 4.0–10.5)
nRBC: 0 % (ref 0.0–0.2)

## 2024-02-29 LAB — MRSA NEXT GEN BY PCR, NASAL: MRSA by PCR Next Gen: NOT DETECTED

## 2024-02-29 LAB — COMPREHENSIVE METABOLIC PANEL WITH GFR
ALT: 14 U/L (ref 0–44)
AST: 15 U/L (ref 15–41)
Albumin: 3.7 g/dL (ref 3.5–5.0)
Alkaline Phosphatase: 79 U/L (ref 38–126)
Anion gap: 8 (ref 5–15)
BUN: 34 mg/dL — ABNORMAL HIGH (ref 8–23)
CO2: 24 mmol/L (ref 22–32)
Calcium: 11 mg/dL — ABNORMAL HIGH (ref 8.9–10.3)
Chloride: 103 mmol/L (ref 98–111)
Creatinine, Ser: 2.14 mg/dL — ABNORMAL HIGH (ref 0.61–1.24)
GFR, Estimated: 34 mL/min — ABNORMAL LOW (ref >60)
Glucose, Bld: 171 mg/dL — ABNORMAL HIGH (ref 70–99)
Potassium: 4.8 mmol/L (ref 3.5–5.1)
Sodium: 135 mmol/L (ref 135–145)
Total Bilirubin: 0.5 mg/dL (ref 0.0–1.2)
Total Protein: 7.7 g/dL (ref 6.5–8.1)

## 2024-02-29 LAB — RAPID URINE DRUG SCREEN, HOSP PERFORMED
Amphetamines: NOT DETECTED
Barbiturates: NOT DETECTED
Benzodiazepines: NOT DETECTED
Cocaine: NOT DETECTED
Opiates: NOT DETECTED
Tetrahydrocannabinol: NOT DETECTED

## 2024-02-29 LAB — PREPARE RBC (CROSSMATCH)

## 2024-02-29 LAB — TROPONIN I (HIGH SENSITIVITY)
Troponin I (High Sensitivity): 18 ng/L — ABNORMAL HIGH (ref <18)
Troponin I (High Sensitivity): 18 ng/L — ABNORMAL HIGH (ref <18)

## 2024-02-29 MED ORDER — HYDRALAZINE HCL 20 MG/ML IJ SOLN: 10.0000 mg | INTRAMUSCULAR | Status: DC | PRN

## 2024-02-29 MED ORDER — MECLIZINE HCL 12.5 MG PO TABS
25.0000 mg | ORAL_TABLET | Freq: Once | ORAL | Status: AC
  Administered 2024-02-29: 25 mg via ORAL
  Filled 2024-02-29: qty 2

## 2024-02-29 MED ORDER — SODIUM CHLORIDE 0.9% FLUSH
3.0000 mL | Freq: Two times a day (BID) | INTRAVENOUS | Status: DC
  Administered 2024-03-01 – 2024-03-03 (×6): 3 mL via INTRAVENOUS

## 2024-02-29 MED ORDER — GABAPENTIN 300 MG PO CAPS
300.0000 mg | ORAL_CAPSULE | Freq: Every day | ORAL | Status: DC
  Administered 2024-02-29 – 2024-03-02 (×3): 300 mg via ORAL
  Filled 2024-02-29 (×3): qty 1

## 2024-02-29 MED ORDER — ACETAMINOPHEN 650 MG RE SUPP: 650.0000 mg | Freq: Four times a day (QID) | RECTAL | Status: DC | PRN

## 2024-02-29 MED ORDER — ATORVASTATIN CALCIUM 40 MG PO TABS
40.0000 mg | ORAL_TABLET | Freq: Every day | ORAL | Status: DC
  Administered 2024-03-01 – 2024-03-03 (×3): 40 mg via ORAL
  Filled 2024-02-29 (×3): qty 1

## 2024-02-29 MED ORDER — ONDANSETRON HCL 4 MG/2ML IJ SOLN
4.0000 mg | Freq: Four times a day (QID) | INTRAMUSCULAR | Status: DC | PRN
Start: 2024-02-29 — End: 2024-03-03

## 2024-02-29 MED ORDER — PANTOPRAZOLE SODIUM 40 MG IV SOLR: 40.0000 mg | INTRAVENOUS | Status: DC

## 2024-02-29 MED ORDER — UMECLIDINIUM-VILANTEROL 62.5-25 MCG/ACT IN AEPB
1.0000 | INHALATION_SPRAY | Freq: Every day | RESPIRATORY_TRACT | Status: DC
  Administered 2024-03-01 – 2024-03-03 (×3): 1 via RESPIRATORY_TRACT
  Filled 2024-02-29: qty 14

## 2024-02-29 MED ORDER — PEG 3350-KCL-NA BICARB-NACL 420 G PO SOLR: 4000.0000 mL | Freq: Once | ORAL | Status: DC

## 2024-02-29 MED ORDER — DEXTROSE-SODIUM CHLORIDE 5-0.45 % IV SOLN
INTRAVENOUS | Status: DC
Start: 2024-03-01 — End: 2024-03-01

## 2024-02-29 MED ORDER — PANTOPRAZOLE SODIUM 40 MG IV SOLR: 40.0000 mg | Freq: Once | INTRAVENOUS | Status: DC

## 2024-02-29 MED ORDER — AMLODIPINE BESYLATE 5 MG PO TABS
2.5000 mg | ORAL_TABLET | Freq: Every day | ORAL | Status: DC
  Administered 2024-03-01 – 2024-03-03 (×3): 2.5 mg via ORAL
  Filled 2024-02-29 (×3): qty 1

## 2024-02-29 MED ORDER — PANTOPRAZOLE SODIUM 40 MG IV SOLR
40.0000 mg | Freq: Two times a day (BID) | INTRAVENOUS | Status: DC
  Administered 2024-02-29 – 2024-03-03 (×6): 40 mg via INTRAVENOUS
  Filled 2024-02-29 (×6): qty 10

## 2024-02-29 MED ORDER — ONDANSETRON HCL 4 MG PO TABS: 4.0000 mg | ORAL_TABLET | Freq: Four times a day (QID) | ORAL | Status: DC | PRN

## 2024-02-29 MED ORDER — ALBUTEROL SULFATE (2.5 MG/3ML) 0.083% IN NEBU
3.0000 mL | INHALATION_SOLUTION | Freq: Four times a day (QID) | RESPIRATORY_TRACT | Status: DC | PRN
  Administered 2024-03-01: 3 mL via RESPIRATORY_TRACT
  Filled 2024-02-29: qty 3

## 2024-02-29 MED ORDER — ROPINIROLE HCL 1 MG PO TABS
1.0000 mg | ORAL_TABLET | Freq: Every day | ORAL | Status: DC
  Administered 2024-02-29 – 2024-03-02 (×3): 1 mg via ORAL
  Filled 2024-02-29 (×3): qty 1

## 2024-02-29 MED ORDER — SODIUM CHLORIDE 0.9% IV SOLUTION: Freq: Once | INTRAVENOUS | Status: DC

## 2024-02-29 MED ORDER — ACETAMINOPHEN 325 MG PO TABS: 650.0000 mg | ORAL_TABLET | Freq: Four times a day (QID) | ORAL | Status: DC | PRN

## 2024-02-29 NOTE — H&P (Signed)
 History and Physical    Patient: Raymond Barrera. WUJ:[id number] DOB: November 03, 1961 DOA: 02/29/2024 DOS: the patient was seen and examined on 02/29/2024 PCP: Anabel Halon, MD  Patient coming from: Home  Chief Complaint:  Chief Complaint  Patient presents with   Dizziness   HPI: Crit Obremski. is a 63 y.o. 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 He reported the sensation of dizziness while home alone yesterday causing fall onto the bed and down to the floor without significant trauma. This persisted and his wife called PCP's office, was referred to the ED. He was also nauseated most of the past few days, no vomiting, no diarrhea, constipation or noticing blood in stools. No exertional dyspnea, orthopnea, PND.  Vitals were stable on arrival to the ED, neuroimaging was nonacute, and hgb noted to be 6.9g/dl (with microcytic indices and thrombocytosis at 622k). SCr also up from previous at 2.14 with elevated BUN at 34. ECG w/NSR.   GI was consulted given his history of +FOBT and transfusion last month, recommended clear liquid diet, NPO after midnight for endoscopic evaluation 4/1. 1u RBCs ordered and hospitalists consulted for admission.  Review of Systems: As mentioned in the history of present illness. All other systems reviewed and are negative. Past Medical History:  Diagnosis Date   Ambulates with cane    straight - uses occasionally   Anxiety    due to the stroke   Arthritis    Back pain    hx of buldging disc   Complication of anesthesia    took a while for him to wake up after previous anesthesia   CVA (cerebral vascular accident) (HCC) 10/07/2020   Diabetes mellitus without complication (HCC)    Family history of adverse reaction to anesthesia    "sometimes mom has a hard  time waking up"   High cholesterol    takes Zocor daily   Hypertension    takes Benazepril and HCTZ  daily   Joint pain    Joint swelling    Memory impairment    occassional - from stroke   Myocardial infarction (HCC) 1987   Pneumonia    hx of-80's   Shortness of breath dyspnea    do to pain   Sleep apnea    never had a sleep study,but states Dr. Janna Arch says he has it   Slurred speech    Stroke (HCC) 08/2013   7 mini-strokes, last stroke 2017   TIA (transient ischemic attack) 2014   x 7    Urinary frequency    Past Surgical History:  Procedure Laterality Date   ABDOMINAL EXPOSURE N/A 06/16/2018   Procedure: ABDOMINAL EXPOSURE;  Surgeon: Larina Earthly, MD;  Location: [facility] OR;  Service: Vascular;  Laterality: N/A;   ANKLE SURGERY  2008   left ankle-otif-[facility]   ANTERIOR LUMBAR FUSION Bilateral 06/16/2018   Procedure: LUMBAR 4-5 LUMBAR 5-SACRUM 1 ANTERIOR LUMBAR INTERBODY FUSION WITH INSTRUMENTATION AND ALLOGRAFT;  Surgeon: Estill Bamberg, MD;  Location: [facility] OR;  Service: Orthopedics;  Laterality: Bilateral;   BACK SURGERY     HEMATOMA EVACUATION Left 08/13/2018   Procedure: EVACUATION HEMATOMA LEFT ABDOMINAL WALL;  Surgeon: Larina Earthly, MD;  Location: [facility] OR;  Service: Vascular;  Laterality: Left;   IR RADIOLOGIST EVAL & MGMT  07/27/2018  IR US GUIDE BX ASP/DRAIN  07/14/2018   JOINT REPLACEMENT     both hips replaced    LUMBAR LAMINECTOMY/DECOMPRESSION MICRODISCECTOMY Right 10/17/2014   Procedure: LUMBAR LAMINECTOMY/DECOMPRESSION MICRODISCECTOMY 2 LEVELS;  Surgeon: Lisbeth Renshaw, MD;  Location: [facility];  Service: Neurosurgery;  Laterality: Right;  Right L45 L5S1 laminectomy and foraminotomy   MASS EXCISION  09/13/2012   Procedure: EXCISION MASS;  Surgeon: Dalia Heading, MD;  Location: [facility] ORS;  Service: General;  Laterality: N/A;   ROBOTIC ASSITED PARTIAL NEPHRECTOMY Left 03/30/2019   Procedure: XI ROBOTIC ASSITED PARTIAL NEPHRECTOMY POSSIBLE RADICAL NEPHRECTOMY;  Surgeon:  Rene Paci, MD;  Location: [facility] ORS;  Service: Urology;  Laterality: Left;   SHOULDER ARTHROSCOPY WITH ROTATOR CUFF REPAIR Right 04/12/2020   Procedure: right shoulder arthroscopy, debridement, mini open rotator cuff tear repair;  Surgeon: Cammy Copa, MD;  Location: [facility] OR;  Service: Orthopedics;  Laterality: Right;   TONSILLECTOMY     TOTAL HIP ARTHROPLASTY Right 03/20/2015   TOTAL HIP ARTHROPLASTY Right 03/20/2015   Procedure: TOTAL HIP ARTHROPLASTY ANTERIOR APPROACH;  Surgeon: Sheral Apley, MD;  Location: [facility] OR;  Service: Orthopedics;  Laterality: Right;   TOTAL HIP ARTHROPLASTY Left 01/08/2016   Procedure: TOTAL HIP ARTHROPLASTY ANTERIOR APPROACH;  Surgeon: Sheral Apley, MD;  Location: [facility] OR;  Service: Orthopedics;  Laterality: Left;   Social History:  reports that he has been smoking cigarettes. He has a 11.8 pack-year smoking history. He has been exposed to tobacco smoke. He has never used smokeless tobacco. He reports that he does not currently use alcohol. He reports that he does not currently use drugs after having used the following drugs: Cocaine.  Allergies  Allergen Reactions   Poison Ivy Extract [Poison Ivy Extract] Itching and Rash   Coreg [Carvedilol] Itching    Itching and dizziness    Family History  Problem Relation Age of Onset   Heart disease Father     Prior to Admission medications   Medication Sig Start Date End Date Taking? Authorizing Provider  Accu-Chek Softclix Lancets lancets SMARTSIG:Topical 2-3 Times Daily 05/15/23   [provider]  albuterol (VENTOLIN HFA) 108 (90 Base) MCG/ACT inhaler Inhale 2 puffs into the lungs every 6 (six) hours as needed for wheezing or shortness of breath. 01/22/24   Del Newman Nip, Tenna Child, FNP  amLODipine (NORVASC) 2.5 MG tablet Take 1 tablet (2.5 mg total) by mouth daily. 01/26/24 04/25/24  Reather Littler D, NP  aspirin EC 81 MG tablet Take 81 mg by mouth daily. Swallow whole.    [provider]  atorvastatin (LIPITOR) 40 MG tablet Take 1 tablet (40 mg total) by mouth daily. 01/01/24   Anabel Halon, MD  carvedilol (COREG) 3.125 MG tablet Take 3.125 mg by mouth 2 (two) times daily with a meal. Patient not taking: Reported on 02/25/2024    [provider]  clopidogrel (PLAVIX) 75 MG tablet Take 1 tablet (75 mg total) by mouth daily. 01/01/24   Anabel Halon, MD  DULoxetine (CYMBALTA) 30 MG capsule Take 1 capsule (30 mg total) by mouth daily. 02/25/24   Anabel Halon, MD  gabapentin (NEURONTIN) 300 MG capsule Take 1 capsule (300 mg total) by mouth at bedtime. 02/25/24   Anabel Halon, MD  JARDIANCE 10 MG TABS tablet Take 1 tablet (10 mg total) by mouth daily. 01/01/24   Anabel Halon, MD  lidocaine (LIDODERM) 5 % Place 1 patch onto the skin daily. Remove &  Discard patch within 12 hours or as directed by MD 07/10/19   Aviva Kluver B, PA-C  pantoprazole (PROTONIX) 40 MG tablet Take 1 tablet (40 mg total) by mouth daily. 02/25/24   Anabel Halon, MD  rOPINIRole (REQUIP) 1 MG tablet Take 1 tablet (1 mg total) by mouth at bedtime. 01/01/24   Anabel Halon, MD  umeclidinium-vilanterol (ANORO ELLIPTA) 62.5-25 MCG/ACT AEPB Inhale 1 puff into the lungs daily. Patient not taking: Reported on 02/25/2024 10/14/23   Glenford Bayley, NP  umeclidinium-vilanterol [facility] ELLIPTA) 62.5-25 MCG/ACT AEPB Inhale 1 puff into the lungs daily. 12/04/23   Glenford Bayley, NP    Physical Exam: Vitals:   02/29/24 1041 02/29/24 1042 02/29/24 1522 02/29/24 1523  BP: 119/87  (!) 138/105 (!) 138/105  Pulse: 87  83 80  Resp: 18  17 17   Temp: 98.6 F (37 C)  98.3 F (36.8 C) 98.3 F (36.8 C)  TempSrc: Temporal  Oral Oral  SpO2: 99%  99%   Weight:  109.4 kg    Height:  6\' 6"  (1.981 m)    Gen: No distress, tall male HEENT: Pale conjunctivae Pulm: Clear, nonlabored  CV: RRR, no MRG or edema GI: Soft, NT, ND, +BS Neuro: Alert and oriented. No new focal deficits. Ext: Warm, no  deformities, weakness in right hand and left leg is chronic without deformity noted. Skin: Lipoma left upper back, suspected dermoid cyst-like structures on mid back, no erythema or tenderness. No other rashes, lesions or ulcers on visualized skin   Data Reviewed: ECG: NSR, early RWP CXR: No acute findings CT head: No acute findings MRI brain: No acute findings on background of CVD. Labs as mentioned in HPI.  Assessment and Plan: Symptomatic anemia due to chronic GI blood loss:  - GI consulted, will pursue investigation 4/1, have discussed with pt and spouse already. Will give CLD now and NPO p MN - Continue PPI, make IV q12h - 1u RBCs to be transfused, VSS can admit to telemetry and monitor CBC in AM. Consider IV iron prior to discharge  Dizziness: Suspected to be due to symptomatic anemia.  - Continue dose-reduced gabapentin 300mg  qHS as increase to 400mg  BID coincides with symptom timeline. Negative neuroimaging.  History of CVA, HLD:  - Continue statin, will hold DAPT for right now pending GI work up  HTN, history of HFrEF: - Clinically compensated currently, no recent symptoms of CHF. Will hold jardiance for now. Entresto not on list due to CKD.  - Continue norvasc (has allergy listed to coreg).    T2DM with diabetic peripheral neuropathy: Last HbA1c 6.8%.  - SSI, hold jardiance pre-procedurally  - Continue cymbalta  RLS:  - Continue requip  Stage IIIa CKD, hx partial left nephrectomy for RCC: Based on present Cr trends.  - Another reason to avoid NSAIDs - Avoid nephrotoxins - Recheck in AM.   Asthma/COPD: Quiescent.  - Continue controlled medication and prn albuterol  History of cocaine use:  - UDS    Advance Care Planning: Full code  Consults: GI  Family Communication: Spouse at bedside  Severity of Illness: The appropriate patient status for this patient is OBSERVATION. Observation status is judged to be reasonable and necessary in order to provide the  required intensity of service to ensure the patient's safety. The patient's presenting symptoms, physical exam findings, and initial radiographic and laboratory data in the context of their medical condition is felt to place them at decreased risk for further clinical deterioration.  Furthermore, it is anticipated that the patient will be medically stable for discharge from the hospital within 2 midnights of admission.   Author: Tyrone Nine, MD 02/29/2024 3:37 PM  For on call review [email or web address].

## 2024-02-29 NOTE — Telephone Encounter (Signed)
 Chief Complaint: dizziness Symptoms: a little confusion Frequency: since 3/25 Pertinent Negatives: Patient denies other symptoms Disposition: [x] ED /[] Urgent Care (no appt availability in office) / [] Appointment(In office/virtual)/ []  [place] [facility]/ [] Home Care/ [] Refused Recommended Disposition /[] [place] [facility]/ []  Follow-up with PCP Additional Notes: Patient's wife called saying the patient is very dizzy when standing, swaying, arms out like he's about to fall. She says he fell yesterday when he was home alone and she's afraid to leave him by himself again. She says he is walking with a walker, but she's right beside him. BS this morning after eating 219, BP 144/95 P 87. Denies vertigo. Advised ED, she agreed and will drive him.   Copied from CRM [phone]. Topic: Clinical - Red Word Triage >> Feb 29, 2024  9:19 AM Nyra Capes wrote: Red Word that prompted transfer to Nurse Triage: patient wife calling in [name] blood sugar 219 this morning and BP 144/96  87 pulse  patient is unsteady walking, dizzy Reason for Disposition  SEVERE dizziness (e.g., unable to stand, requires support to walk, feels like passing out now)  Answer Assessment - Initial Assessment Questions 1. DESCRIPTION: "Describe your dizziness."     Stand up feel like will fall, fell yesterday 2. LIGHTHEADED: "Do you feel lightheaded?" (e.g., somewhat faint, woozy, weak upon standing)     Yes 3. VERTIGO: "Do you feel like either you or the room is spinning or tilting?" (i.e. vertigo)     No 4. SEVERITY: "How bad is it?"  "Do you feel like you are going to faint?" "Can you stand and walk?"   - MILD: Feels slightly dizzy, but walking normally.   - MODERATE: Feels unsteady when walking, but not falling; interferes with normal activities (e.g., school, work).   - SEVERE: Unable to walk without falling, or requires assistance to walk without falling; feels like passing out now.      Severe 5. ONSET:  "When did the  dizziness begin?"     3/25, saw PCP on 3/26 and was dizzy at that time 6. AGGRAVATING FACTORS: "Does anything make it worse?" (e.g., standing, change in head position)     Standing 7. HEART RATE: "Can you tell me your heart rate?" "How many beats in 15 seconds?"  (Note: not all patients can do this)       87 8. CAUSE: "What do you think is causing the dizziness?"     Not sure what is going on with medications   9. OTHER SYMPTOMS: "Do you have any other symptoms?" (e.g., fever, chest pain, vomiting, diarrhea, bleeding)       A little confusion  Protocols used: Dizziness - Lightheadedness-A-AH

## 2024-02-29 NOTE — ED Notes (Signed)
Pt gone to radiology 

## 2024-02-29 NOTE — ED Provider Notes (Signed)
  [place] EMERGENCY DEPARTMENT AT [facility] Provider Note Note created in error.  Please see note from same day   Kathlen Mody 02/29/24 1613    Pricilla Loveless, MD 03/01/24 [phone]

## 2024-02-29 NOTE — Progress Notes (Deleted)
 Cardiology Office Note    Date:  02/29/2024  ID:  [name], DOB Nov 09, 1961, MRN [id number] PCP:  Anabel Halon, MD  Cardiologist:  Thurmon Fair, MD  Electrophysiologist:  None   Chief Complaint: ***  History of Present Illness: .    Raymond Barrera. is a 63 y.o. male with visit-pertinent history of ***  Labwork independently reviewed:   ROS: .   *** denies chest pain, shortness of breath, lower extremity edema, fatigue, palpitations, melena, hematuria, hemoptysis, diaphoresis, weakness, presyncope, syncope, orthopnea, and PND.  All other systems are reviewed and otherwise negative.  Studies Reviewed: Marland Kitchen    EKG:  EKG is ordered today, personally reviewed, demonstrating ***     CV Studies: Cardiac studies reviewed are outlined and summarized above. Otherwise please see EMR for full report. Cardiac Studies & Procedures   ______________________________________________________________________________________________   STRESS TESTS  MYOCARDIAL PERFUSION IMAGING 06/15/2018  Narrative  Nuclear stress EF: 24%.  There was no ST segment deviation noted during stress.  No T wave inversion was noted during stress.  This is a high risk study.  The left ventricular ejection fraction is severely decreased (<30%).  High risk nuclear study due to severe dilated cardiomyopathy, LVEF<30%. There is no reversible ischemia.   ECHOCARDIOGRAM  ECHOCARDIOGRAM COMPLETE 01/13/2023  Narrative ECHOCARDIOGRAM REPORT    Patient Name:   Raymond Barrera. Date of Exam: 01/13/2023 Medical Rec #:  [id number]          Height:       78.0 in Accession #:    [id number]         Weight:       251.8 lb Date of Birth:  [birth year]          BSA:          2.490 m Patient Age:    61 years           BP:           162/92 mmHg Patient Gender: M                  HR:           66 bpm. Exam Location:  Inpatient  Procedure: 2D Echo, Cardiac Doppler and Color Doppler  Indications:     TIA  History:        Patient has prior history of Echocardiogram examinations, most recent 12/09/2019. Risk Factors:Diabetes, Dyslipidemia, Current Smoker and Hypertension. Hx TIA, stroke.  Sonographer:    Ross Ludwig RDCS (AE) Referring Phys: [id number] Lynnae January  IMPRESSIONS   1. Left ventricular ejection fraction, by estimation, is 30 to 35%. The left ventricle has moderately decreased function. The left ventricle demonstrates global hypokinesis. There is mild concentric left ventricular hypertrophy. Left ventricular diastolic parameters are consistent with Grade I diastolic dysfunction (impaired relaxation). 2. Right ventricular systolic function is normal. The right ventricular size is normal. Tricuspid regurgitation signal is inadequate for assessing PA pressure. 3. The mitral valve is normal in structure. Trivial mitral valve regurgitation. 4. The aortic valve is tricuspid. There is mild calcification of the aortic valve. There is mild thickening of the aortic valve. Aortic valve regurgitation is not visualized. Aortic valve sclerosis/calcification is present, without any evidence of aortic stenosis. 5. Aortic dilatation noted. There is borderline dilatation of the aortic root, measuring 38 mm. There is mild dilatation of the ascending aorta, measuring 41 mm. 6. The inferior vena cava is normal in size  with greater than 50% respiratory variability, suggesting right atrial pressure of 3 mmHg.  Comparison(s): Compared to prior TTE, the EF has decreased from 40-45% to ~35%. Otherwise, there is no significant change.  Conclusion(s)/Recommendation(s): No intracardiac source of embolism detected on this transthoracic study. Consider a transesophageal echocardiogram to exclude cardiac source of embolism if clinically indicated.  FINDINGS Left Ventricle: Left ventricular ejection fraction, by estimation, is 30 to 35%. The left ventricle has moderately decreased function. The left ventricle  demonstrates global hypokinesis. The left ventricular internal cavity size was normal in size. There is mild concentric left ventricular hypertrophy. Left ventricular diastolic parameters are consistent with Grade I diastolic dysfunction (impaired relaxation).  Right Ventricle: The right ventricular size is normal. No increase in right ventricular wall thickness. Right ventricular systolic function is normal. Tricuspid regurgitation signal is inadequate for assessing PA pressure.  Left Atrium: Left atrial size was normal in size.  Right Atrium: Right atrial size was normal in size.  Pericardium: There is no evidence of pericardial effusion.  Mitral Valve: The mitral valve is normal in structure. Trivial mitral valve regurgitation.  Tricuspid Valve: The tricuspid valve is normal in structure. Tricuspid valve regurgitation is trivial.  Aortic Valve: The aortic valve is tricuspid. There is mild calcification of the aortic valve. There is mild thickening of the aortic valve. Aortic valve regurgitation is not visualized. Aortic valve sclerosis/calcification is present, without any evidence of aortic stenosis. Aortic valve mean gradient measures 4.0 mmHg. Aortic valve peak gradient measures 7.3 mmHg. Aortic valve area, by VTI measures 2.31 cm.  Pulmonic Valve: The pulmonic valve was not well visualized.  Aorta: Aortic dilatation noted. There is borderline dilatation of the aortic root, measuring 38 mm. There is mild dilatation of the ascending aorta, measuring 41 mm.  Venous: The inferior vena cava is normal in size with greater than 50% respiratory variability, suggesting right atrial pressure of 3 mmHg.  IAS/Shunts: The atrial septum is grossly normal.   LEFT VENTRICLE PLAX 2D LVIDd:         4.70 cm      Diastology LVIDs:         4.20 cm      LV e' medial:    4.57 cm/s LV PW:         1.10 cm      LV E/e' medial:  9.5 LV IVS:        1.10 cm      LV e' lateral:   5.55 cm/s LVOT diam:      2.30 cm      LV E/e' lateral: 7.8 LV SV:         65 LV SV Index:   26 LVOT Area:     4.15 cm  LV Volumes (MOD) LV vol d, MOD A2C: 220.0 ml LV vol d, MOD A4C: 281.0 ml LV vol s, MOD A2C: 141.0 ml LV vol s, MOD A4C: 188.0 ml LV SV MOD A2C:     79.0 ml LV SV MOD A4C:     281.0 ml LV SV MOD BP:      88.3 ml  RIGHT VENTRICLE             IVC RV Basal diam:  3.50 cm     IVC diam: 2.20 cm RV Mid diam:    3.60 cm RV S prime:     11.10 cm/s TAPSE (M-mode): 1.9 cm  LEFT ATRIUM  Index        RIGHT ATRIUM           Index LA diam:        2.90 cm 1.16 cm/m   RA Area:     24.50 cm LA Vol (A2C):   72.7 ml 29.20 ml/m  RA Volume:   85.10 ml  34.18 ml/m LA Vol (A4C):   66.8 ml 26.83 ml/m LA Biplane Vol: 70.6 ml 28.36 ml/m AORTIC VALVE AV Area (Vmax):    2.63 cm AV Area (Vmean):   2.21 cm AV Area (VTI):     2.31 cm AV Vmax:           135.00 cm/s AV Vmean:          99.200 cm/s AV VTI:            0.282 m AV Peak Grad:      7.3 mmHg AV Mean Grad:      4.0 mmHg LVOT Vmax:         85.50 cm/s LVOT Vmean:        52.800 cm/s LVOT VTI:          0.157 m LVOT/AV VTI ratio: 0.56  AORTA Ao Root diam: 3.80 cm Ao Asc diam:  4.10 cm  MITRAL VALVE MV Area (PHT): 2.32 cm    SHUNTS MV Decel Time: 327 msec    Systemic VTI:  0.16 m MV E velocity: 43.30 cm/s  Systemic Diam: 2.30 cm MV A velocity: 71.60 cm/s MV E/A ratio:  0.60  Laurance Flatten MD Electronically signed by Laurance Flatten MD Signature Date/Time: 01/13/2023/2:34:05 PM    Final    MONITORS  LONG TERM MONITOR (3-14 DAYS) 03/09/2023  Narrative   The dominant rhythm is sinus rhythm first-degree AV block, with normal circadian variation.   Isolated premature ventricular contractions are seen representing 2% of all recorded beats.  Sometimes these occur in a pattern of bigeminy or trigeminy and there are rare ventricular couplets.  There is no evidence of ventricular tachycardia.  Although more than one QRS  morphology is seen, there is one clear dominant PVC morphology.  The PVCs are clearly more active during the daytime and greatly diminished in frequency at night.   There is no meaningful atrial arrhythmia, including no atrial fibrillation.   Symptom triggered recordings generally correlate with periods of frequent PVCs.  Mildly abnormal arrhythmia monitor due to the presence of occasional monomorphic PVCs, representing 2% of all recorded beats.  There is no high-grade ventricular arrhythmia. The patient's symptoms seem to be attributable to frequent PVCs.  Patch Wear Time:  7 days and 0 hours (2024-03-23T14:31:56-0400 to 2024-03-30T14:50:13-0400)  Patient had a min HR of 58 bpm, max HR of 153 bpm, and avg HR of 85 bpm. Predominant underlying rhythm was Sinus Rhythm. First Degree AV Block was present. Slight P wave morphology changes were noted. Isolated SVEs were rare (<1.0%), and no SVE Couplets or SVE Triplets were present. Isolated VEs were occasional (2.0%, 17007), VE Couplets were rare (<1.0%, 62), and no VE Triplets were present. Ventricular Bigeminy and Trigeminy were present.       ______________________________________________________________________________________________       Current Reported Medications:.    No outpatient medications have been marked as taking for the 03/02/24 encounter (Appointment) with Rip Harbour, NP.    Physical Exam:    VS:  There were no vitals taken for this visit.   Wt Readings from Last 3 Encounters:  02/29/24 241  lb 2.9 oz (109.4 kg)  02/25/24 241 lb 3.2 oz (109.4 kg)  01/27/24 235 lb 14.3 oz (107 kg)    GEN: Well nourished, well developed in no acute distress NECK: No JVD; No carotid bruits CARDIAC: ***RRR, no murmurs, rubs, gallops RESPIRATORY:  Clear to auscultation without rales, wheezing or rhonchi  ABDOMEN: Soft, non-tender, non-distended EXTREMITIES:  No edema; No acute deformity     Asessement and Plan:.     ***      Disposition: F/u with ***  Signed, Rip Harbour, NP

## 2024-02-29 NOTE — ED Notes (Signed)
 ED TO INPATIENT HANDOFF REPORT  ED Nurse Name and Phone #: Bilbo Carcamo RN   S Name/Age/ Raymond Barrera. 63 y.o. male Room/Bed: APA06/APA06  Code Status   Code Status: Prior  Home/SNF/Other Home Patient oriented to: self, place, time, and situation Is this baseline? Yes   Triage Complete: Triage complete  Chief Complaint Symptomatic anemia [D64.9]  Triage Note Pt arrived via POV c/o dizziness that is worsening X 2-3 days. Pt reports recent fall was yesterday, but Pt reports he landed on his bed. Pt denies headache. Pt denies blurry vision.    Allergies Allergies  Allergen Reactions   Poison Ivy Extract [Poison Ivy Extract] Itching and Rash   Coreg [Carvedilol] Itching    Itching and dizziness    Level of Care/Admitting Diagnosis ED Disposition     ED Disposition  Admit   Condition  --   Comment  Hospital Area: [facility] [[id number]]  Level of Care: Telemetry [5]  Covid Evaluation: Asymptomatic - no recent exposure (last 10 days) testing not required  Diagnosis: Symptomatic anemia [1610960]  Admitting Physician: Tyrone Nine [phone]  Attending Physician: Tyrone Nine [email or web address]          B Medical/Surgery History Past Medical History:  Diagnosis Date   Ambulates with cane    straight - uses occasionally   Anxiety    due to the stroke   Arthritis    Back pain    hx of buldging disc   Complication of anesthesia    took a while for him to wake up after previous anesthesia   CVA (cerebral vascular accident) (HCC) 10/07/2020   Diabetes mellitus without complication (HCC)    Family history of adverse reaction to anesthesia    "sometimes mom has a hard time waking up"   High cholesterol    takes Zocor daily   Hypertension    takes Benazepril and HCTZ  daily   Joint pain    Joint swelling    Memory impairment    occassional - from stroke   Myocardial infarction (HCC) 1987   Pneumonia    hx of-80's   Shortness of breath dyspnea    do to pain    Sleep apnea    never had a sleep study,but states Dr. Janna Arch says he has it   Slurred speech    Stroke (HCC) 08/2013   7 mini-strokes, last stroke 2017   TIA (transient ischemic attack) 2014   x 7    Urinary frequency    Past Surgical History:  Procedure Laterality Date   ABDOMINAL EXPOSURE N/A 06/16/2018   Procedure: ABDOMINAL EXPOSURE;  Surgeon: Larina Earthly, MD;  Location: [facility] OR;  Service: Vascular;  Laterality: N/A;   ANKLE SURGERY  2008   left ankle-otif-[facility]   ANTERIOR LUMBAR FUSION Bilateral 06/16/2018   Procedure: LUMBAR 4-5 LUMBAR 5-SACRUM 1 ANTERIOR LUMBAR INTERBODY FUSION WITH INSTRUMENTATION AND ALLOGRAFT;  Surgeon: Estill Bamberg, MD;  Location: [facility] OR;  Service: Orthopedics;  Laterality: Bilateral;   BACK SURGERY     HEMATOMA EVACUATION Left 08/13/2018   Procedure: EVACUATION HEMATOMA LEFT ABDOMINAL WALL;  Surgeon: Larina Earthly, MD;  Location: [facility] OR;  Service: Vascular;  Laterality: Left;   IR RADIOLOGIST EVAL & MGMT  07/27/2018   IR US GUIDE BX ASP/DRAIN  07/14/2018   JOINT REPLACEMENT     both hips replaced    LUMBAR LAMINECTOMY/DECOMPRESSION MICRODISCECTOMY Right 10/17/2014   Procedure: LUMBAR LAMINECTOMY/DECOMPRESSION MICRODISCECTOMY 2 LEVELS;  Surgeon: Marlane Hatcher  Conchita Paris, MD;  Location: [facility] ORS;  Service: Neurosurgery;  Laterality: Right;  Right L45 L5S1 laminectomy and foraminotomy   MASS EXCISION  09/13/2012   Procedure: EXCISION MASS;  Surgeon: Dalia Heading, MD;  Location: [facility] ORS;  Service: General;  Laterality: N/A;   ROBOTIC ASSITED PARTIAL NEPHRECTOMY Left 03/30/2019   Procedure: XI ROBOTIC ASSITED PARTIAL NEPHRECTOMY POSSIBLE RADICAL NEPHRECTOMY;  Surgeon: Rene Paci, MD;  Location: [facility] ORS;  Service: Urology;  Laterality: Left;   SHOULDER ARTHROSCOPY WITH ROTATOR CUFF REPAIR Right 04/12/2020   Procedure: right shoulder arthroscopy, debridement, mini open rotator cuff tear repair;  Surgeon: Cammy Copa, MD;  Location: [facility] OR;  Service:  Orthopedics;  Laterality: Right;   TONSILLECTOMY     TOTAL HIP ARTHROPLASTY Right 03/20/2015   TOTAL HIP ARTHROPLASTY Right 03/20/2015   Procedure: TOTAL HIP ARTHROPLASTY ANTERIOR APPROACH;  Surgeon: Sheral Apley, MD;  Location: [facility] OR;  Service: Orthopedics;  Laterality: Right;   TOTAL HIP ARTHROPLASTY Left 01/08/2016   Procedure: TOTAL HIP ARTHROPLASTY ANTERIOR APPROACH;  Surgeon: Sheral Apley, MD;  Location: [facility] OR;  Service: Orthopedics;  Laterality: Left;     A IV Location/Drains/Wounds Patient Lines/Drains/Airways Status     Active Line/Drains/Airways     Name Placement date Placement time Site Days   Peripheral IV 01/27/24 20 G 1" Left;Posterior;Proximal Forearm 01/27/24  1726  Forearm  33   Closed System Drain 1 Left;Anterior LLQ Other (Comment) 12 Fr. 07/14/18  1441  LLQ  2056   Closed System Drain 1 Left Abdomen Bulb (JP) 15 Fr. 08/13/18  1236  Abdomen  2026   Incision - 4 Ports Abdomen 1: Left;Proximal;Mid 2: Left;Distal;Upper 3: Left;Distal;Medial 4: Left;Distal;Lower 03/30/19  --  -- 1797            Intake/Output Last 24 hours No intake or output data in the 24 hours ending 02/29/24 1537  Labs/Imaging Results for orders placed or performed during the hospital encounter of 02/29/24 (from the past 48 hours)  CBC with Differential     Status: Abnormal   Collection Time: 02/29/24 10:51 AM  Result Value Ref Range   WBC 9.4 4.0 - 10.5 K/uL   RBC 3.88 (L) 4.22 - 5.81 MIL/uL   Hemoglobin 6.9 (LL) 13.0 - 17.0 g/dL    Comment: REPEATED TO VERIFY Reticulocyte Hemoglobin testing may be clinically indicated, consider ordering this additional test [id number] THIS CRITICAL RESULT HAS VERIFIED AND BEEN CALLED TO A DANNER RN BY KIRSTENE FORSYTH ON 03 31 2025 AT 1219, AND HAS BEEN READ BACK.     HCT 26.6 (L) 39.0 - 52.0 %   MCV 68.6 (L) 80.0 - 100.0 fL   MCH 17.8 (L) 26.0 - 34.0 pg   MCHC 25.9 (L) 30.0 - 36.0 g/dL   RDW 69.6 (H) 29.5 - 28.4 %   Platelets 622 (H) 150 -  400 K/uL   nRBC 0.0 0.0 - 0.2 %   Neutrophils Relative % 83 %   Neutro Abs 7.7 1.7 - 7.7 K/uL   Lymphocytes Relative 11 %   Lymphs Abs 1.1 0.7 - 4.0 K/uL   Monocytes Relative 6 %   Monocytes Absolute 0.6 0.1 - 1.0 K/uL   Eosinophils Relative 0 %   Eosinophils Absolute 0.0 0.0 - 0.5 K/uL   Basophils Relative 0 %   Basophils Absolute 0.0 0.0 - 0.1 K/uL   WBC Morphology MORPHOLOGY UNREMARKABLE    Smear Review MORPHOLOGY UNREMARKABLE    Immature Granulocytes 0 %  Abs Immature Granulocytes 0.03 0.00 - 0.07 K/uL   Ovalocytes PRESENT     Comment: Performed at [facility], [address], [place], [place] [address]  Comprehensive metabolic panel     Status: Abnormal   Collection Time: 02/29/24 10:51 AM  Result Value Ref Range   Sodium 135 135 - 145 mmol/L   Potassium 4.8 3.5 - 5.1 mmol/L   Chloride 103 98 - 111 mmol/L   CO2 24 22 - 32 mmol/L   Glucose, Bld 171 (H) 70 - 99 mg/dL    Comment: Glucose reference range applies only to samples taken after fasting for at least 8 hours.   BUN 34 (H) 8 - 23 mg/dL   Creatinine, Ser 2.13 (H) 0.61 - 1.24 mg/dL   Calcium 08.6 (H) 8.9 - 10.3 mg/dL   Total Protein 7.7 6.5 - 8.1 g/dL   Albumin 3.7 3.5 - 5.0 g/dL   AST 15 15 - 41 U/L   ALT 14 0 - 44 U/L   Alkaline Phosphatase 79 38 - 126 U/L   Total Bilirubin 0.5 0.0 - 1.2 mg/dL   GFR, Estimated 34 (L) >60 mL/min    Comment: (NOTE) Calculated using the CKD-EPI Creatinine Equation (2021)    Anion gap 8 5 - 15    Comment: Performed at [facility], [address] [place], [place] [address]  Troponin I (High Sensitivity)     Status: Abnormal   Collection Time: 02/29/24 10:51 AM  Result Value Ref Range   Troponin I (High Sensitivity) 18 (H) <18 ng/L    Comment: (NOTE) Elevated high sensitivity troponin I (hsTnI) values and significant  changes across serial measurements may suggest ACS but many other  chronic and acute conditions are known to elevate hsTnI results.  Refer to the "Links" section  for chest pain algorithms and additional  guidance. Performed at [facility], [address] [place], [place] [address]   Type and screen [facility]     Status: None (Preliminary result)   Collection Time: 02/29/24  1:07 PM  Result Value Ref Range   ABO/RH(D) O POS    Antibody Screen NEG    Sample Expiration 03/03/2024,2359    Unit Number [id number]    Blood Component Type RBC LR PHER1    Unit division 00    Status of Unit ISSUED    Transfusion Status OK TO TRANSFUSE    Crossmatch Result      Compatible Performed at [facility], [address], [place], [place] [address]   Prepare RBC (crossmatch)     Status: None   Collection Time: 02/29/24  1:33 PM  Result Value Ref Range   Order Confirmation      ORDER PROCESSED BY BLOOD BANK Performed at [facility], [address], [place], [place] [address]    DG Chest Port 1 View Result Date: 02/29/2024 CLINICAL DATA:  Dizziness EXAM: PORTABLE CHEST 1 VIEW COMPARISON:  07/12/2021 FINDINGS: Elevated left hemidiaphragm with some adjacent atelectasis. No pneumothorax, effusion or edema. No consolidation. Enlarged cardiopericardial silhouette. Overlapping cardiac leads. IMPRESSION: Elevated left hemidiaphragm.  Left basilar scar or atelectasis. Electronically Signed   By: Karen Kays M.D.   On: 02/29/2024 13:46   MR Brain Wo Contrast (neuro protocol) Result Date: 02/29/2024 CLINICAL DATA:  64 year old male with head trauma, dizziness, syncope. EXAM: MRI HEAD WITHOUT CONTRAST TECHNIQUE: Multiplanar, multiecho pulse sequences of the brain and surrounding structures were obtained without intravenous contrast. COMPARISON:  Head CT this morning.  Brain  MRI 02/04/2023. FINDINGS: Brain: No restricted diffusion to suggest acute infarction. No midline shift, mass effect, evidence of mass lesion, ventriculomegaly, extra-axial collection or acute intracranial hemorrhage. Cervicomedullary junction and pituitary are within normal limits. Chronic  lacunar infarcts in the bilateral deep white matter and deep gray nuclei, more pronounced on the left and with direct chronic involvement and encephalomalacia of the corpus callosum. Patchy and confluent associated additional cerebral white matter T2 and FLAIR hyperintensity. Associated left brainstem Wallerian degeneration. Stable gray and white matter signal since last year. No cortical encephalomalacia identified. Mild left corona radiata hemosiderin. Vascular: Major intracranial vascular flow voids are stable, preserved. Skull and upper cervical spine: Stable visible cervical spine. Visualized bone marrow signal is within normal limits. Sinuses/Orbits: Stable and negative. Other: Visible internal auditory structures appear normal. Normal stylomastoid foramina. Negative visible scalp and face. IMPRESSION: 1. No acute intracranial abnormality. 2. Stable MRI appearance of advanced chronic small vessel disease sequelae since last year. Electronically Signed   By: Odessa Fleming M.D.   On: 02/29/2024 13:29   CT Head Wo Contrast Result Date: 02/29/2024 CLINICAL DATA:  Head trauma with abnormal mental status. Dizziness that is worsening for 2-3 days EXAM: CT HEAD WITHOUT CONTRAST TECHNIQUE: Contiguous axial images were obtained from the base of the skull through the vertex without intravenous contrast. RADIATION DOSE REDUCTION: This exam was performed according to the departmental dose-optimization program which includes automated exposure control, adjustment of the mA and/or kV according to patient size and/or use of iterative reconstruction technique. COMPARISON:  01/13/2023 FINDINGS: Brain: No evidence of acute infarction, hemorrhage, hydrocephalus, extra-axial collection or mass lesion/mass effect. Generalized low-density in the cerebral white matter attributed to chronic small vessel ischemia with chronic perforator infarct at the left corona radiata. Generalized atrophy. Vascular: No hyperdense vessel or unexpected  calcification. Skull: Normal. Negative for fracture or focal lesion. Sinuses/Orbits: No acute finding. IMPRESSION: No acute or interval finding. Extensive chronic small vessel ischemia. Electronically Signed   By: Tiburcio Pea M.D.   On: 02/29/2024 12:44    Pending Labs Unresulted Labs (From admission, onward)    None       Vitals/Pain Today's Vitals   02/29/24 1041 02/29/24 1042 02/29/24 1522 02/29/24 1523  BP: 119/87  (!) 138/105 (!) 138/105  Pulse: 87  83 80  Resp: 18  17 17   Temp: 98.6 F (37 C)  98.3 F (36.8 C) 98.3 F (36.8 C)  TempSrc: Temporal  Oral Oral  SpO2: 99%  99%   Weight:  241 lb 2.9 oz (109.4 kg)    Height:  6\' 6"  (1.981 m)    PainSc:  0-No pain      Isolation Precautions No active isolations  Medications Medications  0.9 %  sodium chloride infusion (Manually program via Guardrails IV Fluids) (has no administration in time range)  pantoprazole (PROTONIX) injection 40 mg (has no administration in time range)  meclizine (ANTIVERT) tablet 25 mg (25 mg Oral Given 02/29/24 1225)    Mobility walks with device     Focused Assessments Cardiac Assessment Handoff:  Cardiac Rhythm: Normal sinus rhythm Lab Results  Component Value Date   TROPONINI <0.03 04/18/2019   Lab Results  Component Value Date   DDIMER  02/03/2011    0.25        AT THE INHOUSE ESTABLISHED CUTOFF VALUE OF 0.48 ug/mL FEU, THIS ASSAY HAS BEEN DOCUMENTED IN THE LITERATURE TO HAVE A SENSITIVITY AND NEGATIVE PREDICTIVE VALUE OF AT LEAST 98 TO 99%.  THE  TEST RESULT SHOULD BE CORRELATED WITH AN ASSESSMENT OF THE CLINICAL PROBABILITY OF DVT / VTE.   Does the Patient currently have chest pain? No    R Recommendations: See Admitting Provider Note  Report given to:   Additional Notes: Pt alert and oriented. Ambulatory with assistance. Wife at bedside.  Blood started in ED

## 2024-02-29 NOTE — Consult Note (Signed)
 Gastroenterology Consult   Referring Provider: [name] Primary Care Physician:  Anabel Halon, MD Primary Gastroenterologist:  Dr. Jana Hakim, last seen Nov 2024  Patient ID: Raymond Barrera.; [id number]; Sep 10, 1961   Admit date: 02/29/2024  LOS: 0 days   Date of Consultation: 02/29/2024  Reason for Consultation:  worsening anemia   History of Present Illness   Raymond Mcgurn. is a pleasant 63 y.o. 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 Found to have Hgb 6.9.   Notably, he has hx of chronic microcytic anemia with IDA, and Hgb 6.2 a month ago requiring blood transfusion of 1 unit PRBCs in the ED. No follow-up H/H that I can see on file. Ferritin in Jan 2025 markedly low at 8, iron sats 3. 1 unit PRBCs has been ordered. When seen in Nov 2024, plans had been for colonoscopy/EGD on Plavix but had to be postponed.   Upon presentation, CT and MRI ordered due to dizziness. MRI brain without contrast without acute abnormality. CT head without contrast also without abnormality. Troponin 18.   Wife is at bedside. He is able to report most of history, although his wife helps if needs assistance. No GERD. No dysphagia. Unintentional weight loss of 5-6 lbs reportedly over past 2 weeks. No overt GI bleeding that he describes; however, it is documented he has reported this to others on the healthcare team today after review of records.  No changes in bowel habits. Last BM yesterday. No melena or hematochezia. Last dose of Plavix this morning. Took Advil 2 days ago but otherwise denies any chronic NSAIDs, no aspirin powders.   No prior EGD or colonoscopy. No FH colon cancer or polyps     Past Medical History:  Diagnosis Date   Ambulates with cane    straight  - uses occasionally   Anxiety    due to the stroke   Arthritis    Back pain    hx of buldging disc   Complication of anesthesia    took a while for him to wake up after previous anesthesia   CVA (cerebral vascular accident) (HCC) 10/07/2020   Diabetes mellitus without complication (HCC)    Family history of adverse reaction to anesthesia    "sometimes mom has a hard time waking up"   High cholesterol    takes Zocor daily   Hypertension    takes Benazepril and HCTZ  daily   Joint pain    Joint swelling    Memory impairment    occassional - from stroke   Myocardial infarction (HCC) 1987   Pneumonia    hx of-80's   Shortness of breath dyspnea    do to pain   Sleep apnea    never had a sleep study,but states Dr. Janna Arch says he has it   Slurred speech    Stroke (HCC) 08/2013   7 mini-strokes, last stroke 2017   TIA (transient ischemic attack) 2014   x 7    Urinary frequency     Past Surgical History:  Procedure Laterality Date   ABDOMINAL EXPOSURE N/A 06/16/2018   Procedure: ABDOMINAL EXPOSURE;  Surgeon: Larina Earthly, MD;  Location: [facility] OR;  Service: Vascular;  Laterality: N/A;   ANKLE SURGERY  2008  left ankle-otif-[facility]   ANTERIOR LUMBAR FUSION Bilateral 06/16/2018   Procedure: LUMBAR 4-5 LUMBAR 5-SACRUM 1 ANTERIOR LUMBAR INTERBODY FUSION WITH INSTRUMENTATION AND ALLOGRAFT;  Surgeon: Estill Bamberg, MD;  Location: [facility] OR;  Service: Orthopedics;  Laterality: Bilateral;   BACK SURGERY     HEMATOMA EVACUATION Left 08/13/2018   Procedure: EVACUATION HEMATOMA LEFT ABDOMINAL WALL;  Surgeon: Larina Earthly, MD;  Location: [facility] OR;  Service: Vascular;  Laterality: Left;   IR RADIOLOGIST EVAL & MGMT  07/27/2018   IR US GUIDE BX ASP/DRAIN  07/14/2018   JOINT REPLACEMENT     both hips replaced    LUMBAR LAMINECTOMY/DECOMPRESSION MICRODISCECTOMY Right 10/17/2014   Procedure: LUMBAR LAMINECTOMY/DECOMPRESSION MICRODISCECTOMY 2 LEVELS;  Surgeon: Lisbeth Renshaw, MD;  Location: [facility] NEURO  ORS;  Service: Neurosurgery;  Laterality: Right;  Right L45 L5S1 laminectomy and foraminotomy   MASS EXCISION  09/13/2012   Procedure: EXCISION MASS;  Surgeon: Dalia Heading, MD;  Location: [facility] ORS;  Service: General;  Laterality: N/A;   ROBOTIC ASSITED PARTIAL NEPHRECTOMY Left 03/30/2019   Procedure: XI ROBOTIC ASSITED PARTIAL NEPHRECTOMY POSSIBLE RADICAL NEPHRECTOMY;  Surgeon: Rene Paci, MD;  Location: [facility] ORS;  Service: Urology;  Laterality: Left;   SHOULDER ARTHROSCOPY WITH ROTATOR CUFF REPAIR Right 04/12/2020   Procedure: right shoulder arthroscopy, debridement, mini open rotator cuff tear repair;  Surgeon: Cammy Copa, MD;  Location: [facility] OR;  Service: Orthopedics;  Laterality: Right;   TONSILLECTOMY     TOTAL HIP ARTHROPLASTY Right 03/20/2015   TOTAL HIP ARTHROPLASTY Right 03/20/2015   Procedure: TOTAL HIP ARTHROPLASTY ANTERIOR APPROACH;  Surgeon: Sheral Apley, MD;  Location: [facility] OR;  Service: Orthopedics;  Laterality: Right;   TOTAL HIP ARTHROPLASTY Left 01/08/2016   Procedure: TOTAL HIP ARTHROPLASTY ANTERIOR APPROACH;  Surgeon: Sheral Apley, MD;  Location: [facility] OR;  Service: Orthopedics;  Laterality: Left;    Prior to Admission medications   Medication Sig Start Date End Date Taking? Authorizing Provider  Accu-Chek Softclix Lancets lancets SMARTSIG:Topical 2-3 Times Daily 05/15/23   [provider]  albuterol (VENTOLIN HFA) 108 (90 Base) MCG/ACT inhaler Inhale 2 puffs into the lungs every 6 (six) hours as needed for wheezing or shortness of breath. 01/22/24   Del Newman Nip, Tenna Child, FNP  amLODipine (NORVASC) 2.5 MG tablet Take 1 tablet (2.5 mg total) by mouth daily. 01/26/24 04/25/24  Reather Littler D, NP  aspirin EC 81 MG tablet Take 81 mg by mouth daily. Swallow whole.    [provider]  atorvastatin (LIPITOR) 40 MG tablet Take 1 tablet (40 mg total) by mouth daily. 01/01/24   Anabel Halon, MD  carvedilol (COREG) 3.125 MG tablet Take 3.125 mg by  mouth 2 (two) times daily with a meal. Patient not taking: Reported on 02/25/2024    [provider]  clopidogrel (PLAVIX) 75 MG tablet Take 1 tablet (75 mg total) by mouth daily. 01/01/24   Anabel Halon, MD  DULoxetine (CYMBALTA) 30 MG capsule Take 1 capsule (30 mg total) by mouth daily. 02/25/24   Anabel Halon, MD  gabapentin (NEURONTIN) 300 MG capsule Take 1 capsule (300 mg total) by mouth at bedtime. 02/25/24   Anabel Halon, MD  JARDIANCE 10 MG TABS tablet Take 1 tablet (10 mg total) by mouth daily. 01/01/24   Anabel Halon, MD  lidocaine (LIDODERM) 5 % Place 1 patch onto the skin daily. Remove & Discard patch within 12 hours or as directed by MD 07/10/19   Dayton Scrape,  Alyssa B, PA-C  pantoprazole (PROTONIX) 40 MG tablet Take 1 tablet (40 mg total) by mouth daily. 02/25/24   Anabel Halon, MD  rOPINIRole (REQUIP) 1 MG tablet Take 1 tablet (1 mg total) by mouth at bedtime. 01/01/24   Anabel Halon, MD  umeclidinium-vilanterol (ANORO ELLIPTA) 62.5-25 MCG/ACT AEPB Inhale 1 puff into the lungs daily. Patient not taking: Reported on 02/25/2024 10/14/23   Glenford Bayley, NP  umeclidinium-vilanterol [facility] ELLIPTA) 62.5-25 MCG/ACT AEPB Inhale 1 puff into the lungs daily. 12/04/23   Glenford Bayley, NP    Current Facility-Administered Medications  Medication Dose Route Frequency Provider Last Rate Last Admin   0.9 %  sodium chloride infusion (Manually program via Guardrails IV Fluids)   Intravenous Once Rigney, Christopher D, PA-C       pantoprazole (PROTONIX) injection 40 mg  40 mg Intravenous Once Rigney, Christopher D, PA-C       Current Outpatient Medications  Medication Sig Dispense Refill   Accu-Chek Softclix Lancets lancets SMARTSIG:Topical 2-3 Times Daily     albuterol (VENTOLIN HFA) 108 (90 Base) MCG/ACT inhaler Inhale 2 puffs into the lungs every 6 (six) hours as needed for wheezing or shortness of breath. 8 g 2   amLODipine (NORVASC) 2.5 MG tablet Take 1 tablet (2.5 mg  total) by mouth daily. 90 tablet 3   aspirin EC 81 MG tablet Take 81 mg by mouth daily. Swallow whole.     atorvastatin (LIPITOR) 40 MG tablet Take 1 tablet (40 mg total) by mouth daily. 90 tablet 1   carvedilol (COREG) 3.125 MG tablet Take 3.125 mg by mouth 2 (two) times daily with a meal. (Patient not taking: Reported on 02/25/2024)     clopidogrel (PLAVIX) 75 MG tablet Take 1 tablet (75 mg total) by mouth daily. 30 tablet 11   DULoxetine (CYMBALTA) 30 MG capsule Take 1 capsule (30 mg total) by mouth daily. 90 capsule 0   gabapentin (NEURONTIN) 300 MG capsule Take 1 capsule (300 mg total) by mouth at bedtime. 90 capsule 1   JARDIANCE 10 MG TABS tablet Take 1 tablet (10 mg total) by mouth daily. 30 tablet 3   lidocaine (LIDODERM) 5 % Place 1 patch onto the skin daily. Remove & Discard patch within 12 hours or as directed by MD 30 patch 0   pantoprazole (PROTONIX) 40 MG tablet Take 1 tablet (40 mg total) by mouth daily. 30 tablet 2   rOPINIRole (REQUIP) 1 MG tablet Take 1 tablet (1 mg total) by mouth at bedtime. 90 tablet 1   umeclidinium-vilanterol (ANORO ELLIPTA) 62.5-25 MCG/ACT AEPB Inhale 1 puff into the lungs daily. (Patient not taking: Reported on 02/25/2024) 3 each 3   umeclidinium-vilanterol (ANORO ELLIPTA) 62.5-25 MCG/ACT AEPB Inhale 1 puff into the lungs daily. 60 each 3    Allergies as of 02/29/2024 - Review Complete 02/29/2024  Allergen Reaction Noted   Poison ivy extract [poison ivy extract] Itching and Rash 05/26/2014   Coreg [carvedilol] Itching 03/26/2023    Family History  Problem Relation Age of Onset   Heart disease Father    Colon cancer Neg Hx    Colon polyps Neg Hx     Social History   Socioeconomic History   Marital status: Married    Spouse name: [name] C.  Grogan   Number of children: Not on file   Years of education: Not on file   Highest education level: 12th grade  Occupational History   Not on file  Tobacco  Use   Smoking status: Some Days    Current  packs/day: 0.25    Average packs/day: 0.3 packs/day for 47.0 years (11.8 ttl pk-yrs)    Types: Cigarettes    Passive exposure: Current   Smokeless tobacco: Never   Tobacco comments:    Currently smokes about 2 cigarettes per day.  07/14/2023 hfb  Vaping Use   Vaping status: Never Used  Substance and Sexual Activity   Alcohol use: Not Currently    Comment: quit 2012   Drug use: Not Currently    Types: Cocaine    Comment: many yrs ago., last time- late 2016   Sexual activity: Yes  Other Topics Concern   Not on file  Social History Narrative   Are you right handed or left handed? Right   Are you currently employed ? disabled   What is your current occupation?   Do you live at home alone? NO   Who lives with you? Mother, Wife   What type of home do you live in: 1 story or 2 story? 1       Social Drivers of Corporate investment banker Strain: Medium Risk (12/28/2023)   Overall Financial Resource Strain (CARDIA)    Difficulty of Paying Living Expenses: Somewhat hard  Food Insecurity: Food Insecurity Present (12/28/2023)   Hunger Vital Sign    Worried About Running Out of Food in the Last Year: Sometimes true    Ran Out of Food in the Last Year: Sometimes true  Transportation Needs: Unmet Transportation Needs (12/28/2023)   PRAPARE - Administrator, Civil Service (Medical): Yes    Lack of Transportation (Non-Medical): Yes  Physical Activity: Insufficiently Active (12/28/2023)   Exercise Vital Sign    Days of Exercise per Week: 1 day    Minutes of Exercise per Session: 10 min  Stress: Stress Concern Present (12/28/2023)   [employer] of Occupational Health - Occupational Stress Questionnaire    Feeling of Stress : To some extent  Social Connections: Moderately Integrated (12/28/2023)   Social Connection and Isolation Panel [NHANES]    Frequency of Communication with Friends and Family: Once a week    Frequency of Social Gatherings with Friends and Family: Once a  week    Attends Religious Services: More than 4 times per year    Active Member of [employer] or Organizations: Yes    Attends Engineer, structural: More than 4 times per year    Marital Status: Married  Catering manager Violence: Not At Risk (01/13/2023)   Humiliation, Afraid, Rape, and Kick questionnaire    Fear of Current or Ex-Partner: No    Emotionally Abused: No    Physically Abused: No    Sexually Abused: No     Review of Systems   Gen: see HPI CV: Denies chest pain, heart palpitations, syncope, edema  Resp: see HPI GI: see HPI GU : Denies urinary burning, blood in urine, urinary frequency, and urinary incontinence. MS: s/p stroke with chronic right hand weakness, left foot  Derm: Denies rash, itching, dry skin, hives. Psych: Denies depression, anxiety, memory loss, hallucinations, and confusion. Heme: Denies bruising or bleeding Neuro: see HPI  Physical Exam   Vital Signs in last 24 hours: Temp:  [98.3 F (36.8 C)-98.6 F (37 C)] 98.4 F (36.9 C) (03/31 1538) Pulse Rate:  [80-87] 81 (03/31 1538) Resp:  [17-18] 18 (03/31 1538) BP: (119-142)/(87-105) 142/101 (03/31 1538) SpO2:  [96 %-99 %] 96 % (03/31 1538)  Weight:  [109.4 kg] 109.4 kg (03/31 1042)    General:   Alert,  Well-developed, well-nourished, pleasant and cooperative in NAD Head:  Normocephalic and atraumatic. Eyes:  Sclera clear, no icterus.    Ears:  Normal auditory acuity Lungs:  Clear throughout to auscultation.   Heart:  S1 S2 present without obvious murmurs Abdomen:  Soft, nontender and nondistended. No masses, hepatosplenomegaly or hernias noted. Normal bowel sounds, without guarding, and without rebound.   Rectal: deferred   Msk:  Symmetrical without gross deformities. Normal posture. Extremities:  Without clubbing or edema. Neurologic:  Alert and  oriented x4. Skin:  Intact without significant lesions or rashes. Psych:  Alert and cooperative. Normal mood and affect.  Intake/Output from  previous day: No intake/output data recorded. Intake/Output this shift: No intake/output data recorded.   Labs/Studies   Recent Labs Recent Labs    02/29/24 1051  WBC 9.4  HGB 6.9*  HCT 26.6*  PLT 622*   BMET Recent Labs    02/29/24 1051  NA 135  K 4.8  CL 103  CO2 24  GLUCOSE 171*  BUN 34*  CREATININE 2.14*  CALCIUM 11.0*   LFT Recent Labs    02/29/24 1051  PROT 7.7  ALBUMIN 3.7  AST 15  ALT 14  ALKPHOS 79  BILITOT 0.5     Radiology/Studies DG Chest Port 1 View Result Date: 02/29/2024 CLINICAL DATA:  Dizziness EXAM: PORTABLE CHEST 1 VIEW COMPARISON:  07/12/2021 FINDINGS: Elevated left hemidiaphragm with some adjacent atelectasis. No pneumothorax, effusion or edema. No consolidation. Enlarged cardiopericardial silhouette. Overlapping cardiac leads. IMPRESSION: Elevated left hemidiaphragm.  Left basilar scar or atelectasis. Electronically Signed   By: Karen Kays M.D.   On: 02/29/2024 13:46   MR Brain Wo Contrast (neuro protocol) Result Date: 02/29/2024 CLINICAL DATA:  63 year old male with head trauma, dizziness, syncope. EXAM: MRI HEAD WITHOUT CONTRAST TECHNIQUE: Multiplanar, multiecho pulse sequences of the brain and surrounding structures were obtained without intravenous contrast. COMPARISON:  Head CT this morning.  Brain MRI 02/04/2023. FINDINGS: Brain: No restricted diffusion to suggest acute infarction. No midline shift, mass effect, evidence of mass lesion, ventriculomegaly, extra-axial collection or acute intracranial hemorrhage. Cervicomedullary junction and pituitary are within normal limits. Chronic lacunar infarcts in the bilateral deep white matter and deep gray nuclei, more pronounced on the left and with direct chronic involvement and encephalomalacia of the corpus callosum. Patchy and confluent associated additional cerebral white matter T2 and FLAIR hyperintensity. Associated left brainstem Wallerian degeneration. Stable gray and white matter signal  since last year. No cortical encephalomalacia identified. Mild left corona radiata hemosiderin. Vascular: Major intracranial vascular flow voids are stable, preserved. Skull and upper cervical spine: Stable visible cervical spine. Visualized bone marrow signal is within normal limits. Sinuses/Orbits: Stable and negative. Other: Visible internal auditory structures appear normal. Normal stylomastoid foramina. Negative visible scalp and face. IMPRESSION: 1. No acute intracranial abnormality. 2. Stable MRI appearance of advanced chronic small vessel disease sequelae since last year. Electronically Signed   By: Odessa Fleming M.D.   On: 02/29/2024 13:29   CT Head Wo Contrast Result Date: 02/29/2024 CLINICAL DATA:  Head trauma with abnormal mental status. Dizziness that is worsening for 2-3 days EXAM: CT HEAD WITHOUT CONTRAST TECHNIQUE: Contiguous axial images were obtained from the base of the skull through the vertex without intravenous contrast. RADIATION DOSE REDUCTION: This exam was performed according to the departmental dose-optimization program which includes automated exposure control, adjustment of the mA and/or kV according to  patient size and/or use of iterative reconstruction technique. COMPARISON:  01/13/2023 FINDINGS: Brain: No evidence of acute infarction, hemorrhage, hydrocephalus, extra-axial collection or mass lesion/mass effect. Generalized low-density in the cerebral white matter attributed to chronic small vessel ischemia with chronic perforator infarct at the left corona radiata. Generalized atrophy. Vascular: No hyperdense vessel or unexpected calcification. Skull: Normal. Negative for fracture or focal lesion. Sinuses/Orbits: No acute finding. IMPRESSION: No acute or interval finding. Extensive chronic small vessel ischemia. Electronically Signed   By: Tiburcio Pea M.D.   On: 02/29/2024 12:44     Assessment   Raymond Barrera. is a pleasant 63 y.o. 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 Found to have Hgb 6.9.   Acute on chronic anemia: in setting of Plavix and aspirin. Hgb 6.9 on admission, known IDA documented previously, 1 unit PRBCs already received in Feb 2025 with Hgb 6.2 at that time. No post-transfusion H/H. Patient denies any overt GI bleeding to me, but upon review of records this is inconsistent. Receiving 1 unit PRBCs currently. No prior colonoscopy or EGD. No FH colon cancer or polyps. Could be oozing anywhere in the GI tract in setting of Plavix and aspirin. Last dose of plavix this morning. Can pursue diagnostic colonoscopy/EGD with caveat any large polyps or lesions may not be able to be manipulated.   I do note that he was positive for cocaine on urine drug screen as recent as Dec 2024. We will need to check stat urine drug screen prior to prepping for colonoscopy/EGD, as anesthesia may not be appropriate if not life-threatening and ask to postpone as outpatient. Notably, he was also seen in Nov 2024 with recommendations for colonoscopy/EGD due to IDA, but he was lost to follow-up.   In addition, wife is requesting that she use the bowel prep they already bought from the pharmacy, which she will bring to the hospital. I did discuss with her that pharmacy will have to review this prior to approval to administer at bedside.   Plan / Recommendations    Stat urine drug screen Clear liquids IV PPI BID Plan on colonoscopy/EGD on 4/1 with Dr. Levon Hedger if drug screen negative Agree with blood transfusion, serial H/H NPO after midnight except sips with meds Wife requesting to use home bowel prep (Nulytely) that has already been purchased. She is aware would need pharmacy to review this and approve     02/29/2024, 3:49 PM  Gelene Mink, PhD, ANP-BC [facility]

## 2024-02-29 NOTE — Progress Notes (Signed)
 Urine drug screen negative. Will start bowel prep. Nursing staff aware.   Orders placed for colonoscopy/EGD by Dr. Levon Hedger on 4/1.

## 2024-02-29 NOTE — ED Triage Notes (Signed)
 Pt arrived via POV c/o dizziness that is worsening X 2-3 days. Pt reports recent fall was yesterday, but Pt reports he landed on his bed. Pt denies headache. Pt denies blurry vision.

## 2024-02-29 NOTE — ED Provider Notes (Signed)
  EMERGENCY DEPARTMENT AT [facility] Provider Note   CSN: [id number] Arrival date & time: 02/29/24  1022     History  Chief Complaint  Patient presents with   Dizziness    Raymond Barrera. is a 63 y.o. male.  Patient is a 63 year old male who presents the emergency department the chief complaint of dizziness, generalized weakness and notes that he has been experiencing some hematochezia.  He was previously evaluated in the emergency department during which time he was Hemoccult positive stool.  Patient was transfused at that time and then discharged home.  Does not appear the patient has followed up with gastroenterology as of yet.  Patient notes that he is having no active abdominal pain, nausea, vomiting or diarrhea.  He did have a fall yesterday secondary to his dizziness.  He does note that the dizziness is worse with standing as well as with certain movements of his head.  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  He denies any active headache at this time.   Dizziness      Home Medications Prior to Admission medications   Medication Sig Start Date End Date Taking? Authorizing Provider  Accu-Chek Softclix Lancets lancets SMARTSIG:Topical 2-3 Times Daily 05/15/23   [provider]  albuterol (VENTOLIN HFA) 108 (90 Base) MCG/ACT inhaler Inhale 2 puffs into the lungs every 6 (six) hours as needed for wheezing or shortness of breath. 01/22/24   Del Newman Nip, Tenna Child, FNP  amLODipine (NORVASC) 2.5 MG tablet Take 1 tablet (2.5 mg total) by mouth daily. 01/26/24 04/25/24  Reather Littler D, NP  aspirin EC 81 MG tablet Take 81 mg by mouth daily. Swallow whole.    [provider]  atorvastatin (LIPITOR) 40 MG tablet Take 1 tablet (40 mg total) by mouth daily. 01/01/24   Anabel Halon, MD  carvedilol (COREG) 3.125 MG tablet Take 3.125 mg by mouth 2 (two) times daily with a meal. Patient not taking: Reported on 02/25/2024    [provider]   clopidogrel (PLAVIX) 75 MG tablet Take 1 tablet (75 mg total) by mouth daily. 01/01/24   Anabel Halon, MD  DULoxetine (CYMBALTA) 30 MG capsule Take 1 capsule (30 mg total) by mouth daily. 02/25/24   Anabel Halon, MD  gabapentin (NEURONTIN) 300 MG capsule Take 1 capsule (300 mg total) by mouth at bedtime. 02/25/24   Anabel Halon, MD  JARDIANCE 10 MG TABS tablet Take 1 tablet (10 mg total) by mouth daily. 01/01/24   Anabel Halon, MD  lidocaine (LIDODERM) 5 % Place 1 patch onto the skin daily. Remove & Discard patch within 12 hours or as directed by MD 07/10/19   Aviva Kluver B, PA-C  pantoprazole (PROTONIX) 40 MG tablet Take 1 tablet (40 mg total) by mouth daily. 02/25/24   Anabel Halon, MD  rOPINIRole (REQUIP) 1 MG tablet Take 1 tablet (1 mg total) by mouth at bedtime. 01/01/24   Anabel Halon, MD  umeclidinium-vilanterol (ANORO ELLIPTA) 62.5-25 MCG/ACT AEPB Inhale 1 puff into the lungs daily. Patient not taking: Reported on 02/25/2024 10/14/23   Glenford Bayley, NP  umeclidinium-vilanterol [facility] ELLIPTA) 62.5-25 MCG/ACT AEPB Inhale 1 puff into the lungs daily. 12/04/23   Glenford Bayley, NP      Allergies    Poison ivy extract [poison ivy extract] and Coreg [carvedilol]    Review of Systems   Review of Systems  Neurological:  Positive for dizziness.  All other systems reviewed and are negative.   Physical Exam Updated Vital Signs BP 119/87 (BP Location: Right Arm)   Pulse 87   Temp 98.6 F (37 C) (Temporal)   Resp 18   Ht 6\' 6"  (1.981 m)   Wt 109.4 kg   SpO2 99%   BMI 27.87 kg/m  Physical Exam Vitals and nursing note reviewed.  Constitutional:      Appearance: Normal appearance.  HENT:     Head: Normocephalic and atraumatic.     Nose: Nose normal.     Mouth/Throat:     Mouth: Mucous membranes are moist.  Eyes:     Extraocular Movements: Extraocular movements intact.     Conjunctiva/sclera: Conjunctivae normal.     Pupils: Pupils are equal, round, and  reactive to light.  Cardiovascular:     Rate and Rhythm: Normal rate and regular rhythm.     Pulses: Normal pulses.     Heart sounds: Normal heart sounds. No murmur heard.    No gallop.  Pulmonary:     Effort: Pulmonary effort is normal. No respiratory distress.     Breath sounds: Normal breath sounds. No wheezing or rales.  Abdominal:     General: Abdomen is flat. Bowel sounds are normal. There is no distension.     Palpations: Abdomen is soft.     Tenderness: There is no abdominal tenderness. There is no guarding.  Musculoskeletal:        General: No swelling, tenderness, deformity or signs of injury. Normal range of motion.     Cervical back: Normal range of motion and neck supple.  Skin:    General: Skin is warm and dry.  Neurological:     General: No focal deficit present.     Mental Status: He is alert and oriented to person, place, and time. Mental status is at baseline.     Cranial Nerves: No cranial nerve deficit.     Sensory: No sensory deficit.     Motor: No weakness.     Coordination: Coordination normal.     Gait: Gait abnormal.  Psychiatric:        Mood and Affect: Mood normal.        Behavior: Behavior normal.        Thought Content: Thought content normal.        Judgment: Judgment normal.     ED Results / Procedures / Treatments   Labs (all labs ordered are listed, but only abnormal results are displayed) Labs Reviewed  CBC WITH DIFFERENTIAL/PLATELET - Abnormal; Notable for the following components:      Result Value   RBC 3.88 (*)    Hemoglobin 6.9 (*)    HCT 26.6 (*)    MCV 68.6 (*)    MCH 17.8 (*)    MCHC 25.9 (*)    RDW 23.9 (*)    Platelets 622 (*)    All other components within normal limits  COMPREHENSIVE METABOLIC PANEL WITH GFR - Abnormal; Notable for the following components:   Glucose, Bld 171 (*)    BUN 34 (*)    Creatinine, Ser 2.14 (*)    Calcium 11.0 (*)    GFR, Estimated 34 (*)    All other components within normal limits   TROPONIN I (HIGH SENSITIVITY) - Abnormal; Notable for the following components:   Troponin I (High Sensitivity) 18 (*)    All other components within normal limits  TYPE AND SCREEN  PREPARE RBC (CROSSMATCH)  TROPONIN  I (HIGH SENSITIVITY)    EKG EKG Interpretation Date/Time:  Monday February 29 2024 11:44:18 EDT Ventricular Rate:  77 PR Interval:  183 QRS Duration:  100 QT Interval:  381 QTC Calculation: 432 R Axis:   34  Text Interpretation: Sinus rhythm Abnormal R-wave progression, early transition Nonspecific T abnormalities, lateral leads Minimal ST elevation, anterior leads no significant change since Feb 2025 Confirmed by Pricilla Loveless [phone]) on 02/29/2024 12:20:08 PM  Radiology DG Chest Port 1 View Result Date: 02/29/2024 CLINICAL DATA:  Dizziness EXAM: PORTABLE CHEST 1 VIEW COMPARISON:  07/12/2021 FINDINGS: Elevated left hemidiaphragm with some adjacent atelectasis. No pneumothorax, effusion or edema. No consolidation. Enlarged cardiopericardial silhouette. Overlapping cardiac leads. IMPRESSION: Elevated left hemidiaphragm.  Left basilar scar or atelectasis. Electronically Signed   By: Karen Kays M.D.   On: 02/29/2024 13:46   MR Brain Wo Contrast (neuro protocol) Result Date: 02/29/2024 CLINICAL DATA:  63 year old male with head trauma, dizziness, syncope. EXAM: MRI HEAD WITHOUT CONTRAST TECHNIQUE: Multiplanar, multiecho pulse sequences of the brain and surrounding structures were obtained without intravenous contrast. COMPARISON:  Head CT this morning.  Brain MRI 02/04/2023. FINDINGS: Brain: No restricted diffusion to suggest acute infarction. No midline shift, mass effect, evidence of mass lesion, ventriculomegaly, extra-axial collection or acute intracranial hemorrhage. Cervicomedullary junction and pituitary are within normal limits. Chronic lacunar infarcts in the bilateral deep white matter and deep gray nuclei, more pronounced on the left and with direct chronic involvement  and encephalomalacia of the corpus callosum. Patchy and confluent associated additional cerebral white matter T2 and FLAIR hyperintensity. Associated left brainstem Wallerian degeneration. Stable gray and white matter signal since last year. No cortical encephalomalacia identified. Mild left corona radiata hemosiderin. Vascular: Major intracranial vascular flow voids are stable, preserved. Skull and upper cervical spine: Stable visible cervical spine. Visualized bone marrow signal is within normal limits. Sinuses/Orbits: Stable and negative. Other: Visible internal auditory structures appear normal. Normal stylomastoid foramina. Negative visible scalp and face. IMPRESSION: 1. No acute intracranial abnormality. 2. Stable MRI appearance of advanced chronic small vessel disease sequelae since last year. Electronically Signed   By: Odessa Fleming M.D.   On: 02/29/2024 13:29   CT Head Wo Contrast Result Date: 02/29/2024 CLINICAL DATA:  Head trauma with abnormal mental status. Dizziness that is worsening for 2-3 days EXAM: CT HEAD WITHOUT CONTRAST TECHNIQUE: Contiguous axial images were obtained from the base of the skull through the vertex without intravenous contrast. RADIATION DOSE REDUCTION: This exam was performed according to the departmental dose-optimization program which includes automated exposure control, adjustment of the mA and/or kV according to patient size and/or use of iterative reconstruction technique. COMPARISON:  01/13/2023 FINDINGS: Brain: No evidence of acute infarction, hemorrhage, hydrocephalus, extra-axial collection or mass lesion/mass effect. Generalized low-density in the cerebral white matter attributed to chronic small vessel ischemia with chronic perforator infarct at the left corona radiata. Generalized atrophy. Vascular: No hyperdense vessel or unexpected calcification. Skull: Normal. Negative for fracture or focal lesion. Sinuses/Orbits: No acute finding. IMPRESSION: No acute or interval  finding. Extensive chronic small vessel ischemia. Electronically Signed   By: Tiburcio Pea M.D.   On: 02/29/2024 12:44    Procedures Procedures    Medications Ordered in ED Medications  0.9 %  sodium chloride infusion (Manually program via Guardrails IV Fluids) (has no administration in time range)  pantoprazole (PROTONIX) injection 40 mg (has no administration in time range)  meclizine (ANTIVERT) tablet 25 mg (25 mg Oral Given 02/29/24 1225)  ED Course/ Medical Decision Making/ A&P                                 Medical Decision Making Amount and/or Complexity of Data Reviewed Labs: ordered. Radiology: ordered.  Risk Prescription drug management. Decision regarding hospitalization.   This patient presents to the ED for concern of dizziness, weakness, this involves an extensive number of treatment options, and is a complaint that carries with it a high risk of complications and morbidity.  The differential diagnosis includes CVA, TIA, positional vertigo, intracranial bleed, sepsis, pneumonia, ACS, GI bleed, symptomatic anemia   Co morbidities that complicate the patient evaluation  Previous CVA   Additional history obtained:  Additional history obtained from wife External records from outside source obtained and reviewed including medical records   Lab Tests:  I Ordered, and personally interpreted labs.  The pertinent results include: Anemia, elevated troponin, elevated creatinine but at baseline, normal electrolytes and liver function   Imaging Studies ordered:  I ordered imaging studies including CT scan of head, MRI brain, chest x-ray I independently visualized and interpreted imaging which showed no acute intracranial hemorrhage, no acute ischemic changes, no acute cardiopulmonary process I agree with the radiologist interpretation   Cardiac Monitoring: / EKG:  The patient was maintained on a cardiac monitor.  I personally viewed and interpreted the  cardiac monitored which showed an underlying rhythm of: Normal sinus rhythm, no ST/T wave changes, no ischemic changes, no STEMI, EKG consistent with previous   Consultations Obtained:  I requested consultation with the GI, Dr. Marletta Lor,  and discussed lab and imaging findings as well as pertinent plan - they recommend: Admission, clear liquid diet and Protonix   Problem List / ED Course / Critical interventions / Medication management  Patient does remain stable at this time and notes the dizziness did improve with meclizine.  Patient does note that he has been having some ongoing blood in his stools.  Did discuss patient case with Dr. Marletta Lor with gastroenterology.  He did recommend the patient be given Protonix as well as placed on a clear liquid diet for now.  Patient has been ordered a blood transfusion at this point given his anemia.  Will plan for admission for symptomatic anemia.  CT scan of the head demonstrated no signs of acute intracranial hemorrhage and MRI of the brain demonstrated no signs of acute ischemic changes.  Did discuss patient case with Dr. Jarvis Newcomer with the hospitalist service who has excepted for admission at this time. I ordered medication including meclizine, Protonix, packed red blood cells for anemia, GI bleed, dizziness Reevaluation of the patient after these medicines showed that the patient improved I have reviewed the patients home medicines and have made adjustments as needed   Social Determinants of Health:  None   Test / Admission - Considered:  Admission        Final Clinical Impression(s) / ED Diagnoses Final diagnoses:  Anemia, unspecified type  Dizziness  Weakness  Elevated troponin    Rx / DC Orders ED Discharge Orders     None         Lelon Perla, PA-C 02/29/24 1424    Pricilla Loveless, MD 03/01/24 [phone]

## 2024-03-01 ENCOUNTER — Observation Stay (HOSPITAL_BASED_OUTPATIENT_CLINIC_OR_DEPARTMENT_OTHER): Admitting: Certified Registered"

## 2024-03-01 ENCOUNTER — Observation Stay (HOSPITAL_COMMUNITY): Admitting: Certified Registered"

## 2024-03-01 ENCOUNTER — Encounter (HOSPITAL_COMMUNITY): Payer: Self-pay | Admitting: Family Medicine

## 2024-03-01 ENCOUNTER — Encounter (HOSPITAL_COMMUNITY)
Admission: EM | Disposition: A | Discharge status: Home / Self Care | Payer: Self-pay | Source: Home / Self Care | Attending: Emergency Medicine

## 2024-03-01 DIAGNOSIS — K648 Other hemorrhoids: Secondary | ICD-10-CM | POA: Diagnosis not present

## 2024-03-01 DIAGNOSIS — D123 Benign neoplasm of transverse colon: Secondary | ICD-10-CM

## 2024-03-01 DIAGNOSIS — K3189 Other diseases of stomach and duodenum: Secondary | ICD-10-CM

## 2024-03-01 DIAGNOSIS — D509 Iron deficiency anemia, unspecified: Secondary | ICD-10-CM | POA: Diagnosis not present

## 2024-03-01 DIAGNOSIS — K449 Diaphragmatic hernia without obstruction or gangrene: Secondary | ICD-10-CM

## 2024-03-01 DIAGNOSIS — K635 Polyp of colon: Secondary | ICD-10-CM | POA: Diagnosis not present

## 2024-03-01 DIAGNOSIS — D122 Benign neoplasm of ascending colon: Secondary | ICD-10-CM | POA: Diagnosis not present

## 2024-03-01 DIAGNOSIS — I420 Dilated cardiomyopathy: Secondary | ICD-10-CM | POA: Diagnosis not present

## 2024-03-01 DIAGNOSIS — D649 Anemia, unspecified: Secondary | ICD-10-CM | POA: Diagnosis not present

## 2024-03-01 HISTORY — PX: ESOPHAGOGASTRODUODENOSCOPY: SHX5428

## 2024-03-01 HISTORY — PX: POLYPECTOMY: SHX5525

## 2024-03-01 HISTORY — PX: HEMOSTASIS CLIP PLACEMENT: SHX6857

## 2024-03-01 HISTORY — PX: COLONOSCOPY: SHX5424

## 2024-03-01 LAB — RAPID URINE DRUG SCREEN, HOSP PERFORMED
Amphetamines: NOT DETECTED
Barbiturates: NOT DETECTED
Benzodiazepines: NOT DETECTED
Cocaine: NOT DETECTED
Opiates: NOT DETECTED
Tetrahydrocannabinol: NOT DETECTED

## 2024-03-01 LAB — BASIC METABOLIC PANEL WITH GFR
Anion gap: 7 (ref 5–15)
BUN: 27 mg/dL — ABNORMAL HIGH (ref 8–23)
CO2: 26 mmol/L (ref 22–32)
Calcium: 10.6 mg/dL — ABNORMAL HIGH (ref 8.9–10.3)
Chloride: 105 mmol/L (ref 98–111)
Creatinine, Ser: 1.93 mg/dL — ABNORMAL HIGH (ref 0.61–1.24)
GFR, Estimated: 38 mL/min — ABNORMAL LOW (ref >60)
Glucose, Bld: 143 mg/dL — ABNORMAL HIGH (ref 70–99)
Potassium: 5.1 mmol/L (ref 3.5–5.1)
Sodium: 138 mmol/L (ref 135–145)

## 2024-03-01 LAB — CBC
HCT: 28.5 % — ABNORMAL LOW (ref 39.0–52.0)
Hemoglobin: 7.5 g/dL — ABNORMAL LOW (ref 13.0–17.0)
MCH: 18.3 pg — ABNORMAL LOW (ref 26.0–34.0)
MCHC: 26.3 g/dL — ABNORMAL LOW (ref 30.0–36.0)
MCV: 69.5 fL — ABNORMAL LOW (ref 80.0–100.0)
Platelets: 584 10*3/uL — ABNORMAL HIGH (ref 150–400)
RBC: 4.1 MIL/uL — ABNORMAL LOW (ref 4.22–5.81)
RDW: 23.5 % — ABNORMAL HIGH (ref 11.5–15.5)
WBC: 10 10*3/uL (ref 4.0–10.5)
nRBC: 0 % (ref 0.0–0.2)

## 2024-03-01 LAB — PREPARE RBC (CROSSMATCH)

## 2024-03-01 LAB — HIV ANTIBODY (ROUTINE TESTING W REFLEX): HIV Screen 4th Generation wRfx: NONREACTIVE

## 2024-03-01 LAB — GLUCOSE, CAPILLARY
Glucose-Capillary: 93 mg/dL (ref 70–99)
Glucose-Capillary: 87 mg/dL (ref 70–99)

## 2024-03-01 SURGERY — COLONOSCOPY
Anesthesia: General

## 2024-03-01 MED ORDER — ORAL CARE MOUTH RINSE: 15.0000 mL | OROMUCOSAL | Status: DC | PRN

## 2024-03-01 MED ORDER — SODIUM CHLORIDE 0.9% FLUSH: 3.0000 mL | INTRAVENOUS | Status: DC | PRN

## 2024-03-01 MED ORDER — SODIUM CHLORIDE 0.9% FLUSH
3.0000 mL | Freq: Two times a day (BID) | INTRAVENOUS | Status: DC
  Administered 2024-03-01: 3 mL via INTRAVENOUS

## 2024-03-01 MED ORDER — PHENYLEPHRINE 80 MCG/ML (10ML) SYRINGE FOR IV PUSH (FOR BLOOD PRESSURE SUPPORT)
PREFILLED_SYRINGE | INTRAVENOUS | Status: AC
  Filled 2024-03-01: qty 10

## 2024-03-01 MED ORDER — LACTATED RINGERS IV SOLN: INTRAVENOUS | Status: DC | PRN

## 2024-03-01 MED ORDER — KETAMINE HCL 50 MG/5ML IJ SOSY
PREFILLED_SYRINGE | INTRAMUSCULAR | Status: DC | PRN
  Administered 2024-03-01: 30 mg via INTRAVENOUS

## 2024-03-01 MED ORDER — HYDROXYZINE HCL 25 MG PO TABS
25.0000 mg | ORAL_TABLET | Freq: Once | ORAL | Status: DC | PRN
  Filled 2024-03-01: qty 1

## 2024-03-01 MED ORDER — SODIUM CHLORIDE 0.9% FLUSH: 3.0000 mL | Freq: Two times a day (BID) | INTRAVENOUS | Status: DC

## 2024-03-01 MED ORDER — ASPIRIN 81 MG PO CHEW
81.0000 mg | CHEWABLE_TABLET | Freq: Every day | ORAL | Status: DC
  Administered 2024-03-02 – 2024-03-03 (×2): 81 mg via ORAL
  Filled 2024-03-01 (×3): qty 1

## 2024-03-01 MED ORDER — PHENYLEPHRINE 80 MCG/ML (10ML) SYRINGE FOR IV PUSH (FOR BLOOD PRESSURE SUPPORT)
PREFILLED_SYRINGE | INTRAVENOUS | Status: DC | PRN
Start: 2024-03-01 — End: 2024-03-01
  Administered 2024-03-01: 160 ug via INTRAVENOUS

## 2024-03-01 MED ORDER — PROPOFOL 10 MG/ML IV BOLUS
INTRAVENOUS | Status: DC | PRN
  Administered 2024-03-01: 50 mg via INTRAVENOUS
  Administered 2024-03-01: 70 mg via INTRAVENOUS
  Administered 2024-03-01: 30 mg via INTRAVENOUS

## 2024-03-01 MED ORDER — KETAMINE HCL 50 MG/5ML IJ SOSY
PREFILLED_SYRINGE | INTRAMUSCULAR | Status: AC
  Filled 2024-03-01: qty 5

## 2024-03-01 MED ORDER — SODIUM CHLORIDE 0.9 % IV SOLN: INTRAVENOUS | Status: DC

## 2024-03-01 MED ORDER — SODIUM CHLORIDE 0.9% IV SOLUTION: Freq: Once | INTRAVENOUS | Status: DC

## 2024-03-01 MED ORDER — LIDOCAINE HCL (PF) 2 % IJ SOLN
INTRAMUSCULAR | Status: DC | PRN
  Administered 2024-03-01: 100 mg via INTRADERMAL

## 2024-03-01 MED ORDER — PROPOFOL 500 MG/50ML IV EMUL
INTRAVENOUS | Status: DC | PRN
  Administered 2024-03-01: 70 ug/kg/min via INTRAVENOUS

## 2024-03-01 NOTE — Anesthesia Procedure Notes (Signed)
 Date/Time: 03/01/2024 1:46 PM  Performed by: Julian Reil, CRNAPre-anesthesia Checklist: Patient identified, Emergency Drugs available, Suction available and Patient being monitored Patient Re-evaluated:Patient Re-evaluated prior to induction Oxygen Delivery Method: Nasal cannula Induction Type: IV induction Placement Confirmation: positive ETCO2 Comments: Optiflow High Flow  O2 used

## 2024-03-01 NOTE — Transfer of Care (Signed)
 Immediate Anesthesia Transfer of Care Note  Patient: Raymond Barrera.  Procedure(s) Performed: COLONOSCOPY EGD (ESOPHAGOGASTRODUODENOSCOPY) POLYPECTOMY CONTROL OF HEMORRHAGE, GI TRACT, ENDOSCOPIC, BY CLIPPING OR OVERSEWING  Patient Location: PACU  Anesthesia Type:General  Level of Consciousness: drowsy  Airway & Oxygen Therapy: Patient Spontanous Breathing  Post-op Assessment: Report given to RN and Post -op Vital signs reviewed and stable  Post vital signs: Reviewed and stable  Last Vitals:  Vitals Value Taken Time  BP 133/97 03/01/24 1438  Temp 36.9 C 03/01/24 1438  Pulse 79 03/01/24 1445  Resp 17 03/01/24 1445  SpO2 100 % 03/01/24 1445  Vitals shown include unfiled device data.  Last Pain:  Vitals:   03/01/24 1438  TempSrc:   PainSc: 0-No pain         Complications: No notable events documented.

## 2024-03-01 NOTE — Op Note (Addendum)
 [facility] Patient Name: Raymond Barrera Procedure Date: 03/01/2024 1:12 PM MRN: [id number] Date of Birth: 1961/06/08 Attending MD: Katrinka Blazing , , [id number] CSN: [id number] Age: 63 Admit Type: Inpatient Procedure:                Colonoscopy Indications:              Iron deficiency anemia Providers:                Katrinka Blazing, Angelica Ran, Elinor Parkinson Referring MD:              Medicines:                Monitored Anesthesia Care Complications:            No immediate complications. Estimated Blood Loss:     Estimated blood loss: none. Procedure:                Pre-Anesthesia Assessment:                           - Prior to the procedure, a History and Physical                            was performed, and patient medications, allergies                            and sensitivities were reviewed. The patient's                            tolerance of previous anesthesia was reviewed.                           - The risks and benefits of the procedure and the                            sedation options and risks were discussed with the                            patient. All questions were answered and informed                            consent was obtained.                           - ASA Grade Assessment: III - A patient with severe                            systemic disease.                           After obtaining informed consent, the colonoscope                            was passed under direct vision. Throughout the                            procedure, the patient's blood pressure, pulse, and  oxygen saturations were monitored continuously. The                            [id number]-[id number] ([id number]) scope was introduced through                            the anus and advanced to the the cecum, identified                            by appendiceal orifice and ileocecal valve. The                            colonoscopy was performed without  difficulty. The                            patient tolerated the procedure well. The quality                            of the bowel preparation was fair. Scope In: 2:01:56 PM Scope Out: 2:32:13 PM Scope Withdrawal Time: 0 hours 20 minutes 32 seconds  Total Procedure Duration: 0 hours 30 minutes 17 seconds  Findings:      The perianal and digital rectal examinations were normal.      Three sessile polyps were found in the transverse colon. The polyps were       3 to 5 mm in size. These polyps were removed with a cold snare.       Resection and retrieval were complete. To prevent bleeding after the       polypectomy, one hemostatic clip was successfully placed (MR safe). Clip       manufacturer: [employer]. There was no bleeding at the end of the       procedure.      Non-bleeding internal hemorrhoids were found during retroflexion. The       hemorrhoids were small. Impression:               - Preparation of the colon was fair.                           - Three 3 to 5 mm polyps in the transverse colon,                            removed with a cold snare. Resected and retrieved.                            Clip manufacturer: [employer]. Clip (MR                            safe) was placed.                           - Non-bleeding internal hemorrhoids. Moderate Sedation:      Per Anesthesia Care Recommendation:           - Return patient to hospital ward for ongoing care.                           -  Clear liquid diet today. Npo after midnight.                           - Proceed with capsule endoscopy tomorrow.                           - Await pathology results.                           - Daily H/H.                           - Repeat colonoscopy in 1 year because the bowel                            preparation was suboptimal.                           - Avoid Cocaine and NSAID use.                           - Keep on ASA 81 mg for next 5 days, then restart                             Plavix if stable. Procedure Code(s):        --- Professional ---                           [phone], Colonoscopy, flexible; with removal of                            tumor(s), polyp(s), or other lesion(s) by snare                            technique Diagnosis Code(s):        --- Professional ---                           K64.8, Other hemorrhoids                           D12.3, Benign neoplasm of transverse colon (hepatic                            flexure or splenic flexure)                           D50.9, Iron deficiency anemia, unspecified CPT copyright 2022 American Medical Association. All rights reserved. The codes documented in this report are preliminary and upon coder review may  be revised to meet current compliance requirements. Katrinka Blazing, MD Katrinka Blazing,  03/01/2024 2:44:09 PM This report has been signed electronically. Number of Addenda: 0

## 2024-03-01 NOTE — Progress Notes (Signed)
 We will proceed with EGD and colonoscopy as scheduled.  I thoroughly discussed with the patient the procedure, including the risks involved. Patient understands what the procedure involves including the benefits and any risks. Patient understands alternatives to the proposed procedure. Risks including (but not limited to) bleeding, tearing of the lining (perforation), rupture of adjacent organs, problems with heart and lung function, infection, and medication reactions. A small percentage of complications may require surgery, hospitalization, repeat endoscopic procedure, and/or transfusion.  Patient understood and agreed.  Katrinka Blazing, MD Gastroenterology and Hepatology [facility] Gastroenterology

## 2024-03-01 NOTE — Progress Notes (Signed)
 Transition of Care Department [facility]) has reviewed patient and no other TOC needs have been identified at this time. We will continue to monitor patient advancement through interdisciplinary progression rounds. If new patient transition needs arise, please place a TOC consult.   03/01/24 0754  TOC Brief Assessment  Insurance and Status Reviewed  Patient has primary care physician Yes  Home environment has been reviewed Lives with wife and mother.  Prior level of function: Independent.  Prior/Current Home Services No current home services  Social Drivers of Health Review SDOH reviewed no interventions necessary  Readmission risk has been reviewed Yes  Transition of care needs no transition of care needs at this time

## 2024-03-01 NOTE — Op Note (Signed)
 [facility] Patient Name: Raymond Barrera Procedure Date: 03/01/2024 1:13 PM MRN: [id number] Date of Birth: [birth year] Attending MD: Katrinka Blazing , , [id number] CSN: [id number] Age: 63 Admit Type: Inpatient Procedure:                Upper GI endoscopy Indications:              Iron deficiency anemia Providers:                Katrinka Blazing, Angelica Ran, Elinor Parkinson Referring MD:              Medicines:                Monitored Anesthesia Care Complications:            No immediate complications. Estimated Blood Loss:     Estimated blood loss: none. Procedure:                Pre-Anesthesia Assessment:                           - Prior to the procedure, a History and Physical                            was performed, and patient medications, allergies                            and sensitivities were reviewed. The patient's                            tolerance of previous anesthesia was reviewed.                           - The risks and benefits of the procedure and the                            sedation options and risks were discussed with the                            patient. All questions were answered and informed                            consent was obtained.                           - ASA Grade Assessment: III - A patient with severe                            systemic disease.                           After obtaining informed consent, the endoscope was                            passed under direct vision. Throughout the                            procedure, the patient's blood pressure,  pulse, and                            oxygen saturations were monitored continuously. The                            GIF-H190 ([id number]) scope was introduced through the                            mouth, and advanced to the second part of duodenum.                            The upper GI endoscopy was accomplished without                            difficulty. The patient  tolerated the procedure                            well. Scope In: 1:51:26 PM Scope Out: 1:57:57 PM Total Procedure Duration: 0 hours 6 minutes 31 seconds  Findings:      A 1 cm hiatal hernia was present.      Three localized 5 mm erosions with no stigmata of recent bleeding were       found in the gastric antrum. Biopsies were taken with a cold forceps for       Helicobacter pylori testing.      The examined duodenum was normal. Biopsies were taken with a cold       forceps for histology. Impression:               - 1 cm hiatal hernia.                           - Erosive gastropathy with no stigmata of recent                            bleeding. Biopsied.                           - Normal examined duodenum. Biopsied. Moderate Sedation:      Per Anesthesia Care Recommendation:           Proceed with colonoscopy. Procedure Code(s):        --- Professional ---                           [phone], Esophagogastroduodenoscopy, flexible,                            transoral; with biopsy, single or multiple Diagnosis Code(s):        --- Professional ---                           K44.9, Diaphragmatic hernia without obstruction or                            gangrene  K31.89, Other diseases of stomach and duodenum                           D50.9, Iron deficiency anemia, unspecified CPT copyright 2022 American Medical Association. All rights reserved. The codes documented in this report are preliminary and upon coder review may  be revised to meet current compliance requirements. Katrinka Blazing, MD Katrinka Blazing,  03/01/2024 2:36:16 PM This report has been signed electronically. Number of Addenda: 0

## 2024-03-01 NOTE — Anesthesia Preprocedure Evaluation (Signed)
 Anesthesia Evaluation    Reviewed: Allergy & Precautions, Patient's Chart, lab work & pertinent test results, reviewed documented beta blocker date and time   History of Anesthesia Complications (+) PONV, PROLONGED EMERGENCE and history of anesthetic complications  Airway Mallampati: II  TM Distance: >3 FB Neck ROM: Full    Dental  (+) Edentulous Upper, Partial Lower, Dental Advisory Given   Pulmonary shortness of breath, sleep apnea , COPD, Current Smoker and Patient abstained from smoking.          Cardiovascular hypertension, Pt. on medications and Pt. on home beta blockers + Past MI and +CHF    Echo 06/15/18 Study Conclusions  - Left ventricle: The cavity size was mildly dilated. There was moderate concentric hypertrophy. Systolic function was severely reduced. The estimated ejection fraction was in the range of 20% to 25%. Severe diffuse hypokinesis with no identifiable regional variations. Doppler parameters are consistent with abnormal left ventricular relaxation (grade 1 diastolic dysfunction). Acoustic contrast opacification revealed no evidence ofthrombus. - Left atrium: The atrium was severely dilated. - Direct comparison with images from March 2018 shows little change in left ventricular function.  Myocardial Perfusion Imaging 06/15/18  Nuclear stress EF: 24%.  There was no ST segment deviation noted during stress.  No T wave inversion was noted during stress.  This is a high risk study.  The left ventricular ejection fraction is severely decreased (<30%).  High risk nuclear study due to severe dilated cardiomyopathy, LVEF<30%. There is no reversible ischemia.    Neuro/Psych   Anxiety     Peripheral neuropathy TIA Neuromuscular disease CVA, Residual Symptoms    GI/Hepatic negative GI ROS,,,(+)     substance abuse  cocaine use  Endo/Other  diabetes, Type 2, Oral Hypoglycemic Agents     Renal/GU CRFRenal disease  negative genitourinary   Musculoskeletal  (+) Arthritis , Osteoarthritis,    Abdominal   Peds  Hematology  (+) Blood dyscrasia, anemia   Anesthesia Other Findings   Reproductive/Obstetrics                             Anesthesia Physical Anesthesia Plan  ASA: 4  Anesthesia Plan: General   Post-op Pain Management: Minimal or no pain anticipated   Induction: Intravenous  PONV Risk Score and Plan:   Airway Management Planned: Nasal Cannula and Natural Airway  Additional Equipment: None  Intra-op Plan:   Post-operative Plan:   Informed Consent: I have reviewed the patients History and Physical, chart, labs and discussed the procedure including the risks, benefits and alternatives for the proposed anesthesia with the patient or authorized representative who has indicated his/her understanding and acceptance.     Dental advisory given  Plan Discussed with: CRNA and Surgeon  Anesthesia Plan Comments: (According to the RCRI he is a Class IV risk with 11% risk of a major cardiac event. The patient is aware of his risk. He does not need further cardiovascular testing prior to surgery. He is willing to proceed with this necessary surgery.  He is at risk for volume overload with excess IVF that may be needed during surgery. May require IV lasix following surgery.")        Anesthesia Quick Evaluation

## 2024-03-01 NOTE — Anesthesia Postprocedure Evaluation (Signed)
 Anesthesia Post Note  Patient: Terius Jacuinde.  Procedure(s) Performed: COLONOSCOPY EGD (ESOPHAGOGASTRODUODENOSCOPY) POLYPECTOMY CONTROL OF HEMORRHAGE, GI TRACT, ENDOSCOPIC, BY CLIPPING OR OVERSEWING  Patient location during evaluation: PACU Anesthesia Type: General Level of consciousness: awake and alert Pain management: pain level controlled Vital Signs Assessment: post-procedure vital signs reviewed and stable Respiratory status: spontaneous breathing, nonlabored ventilation, respiratory function stable and patient connected to nasal cannula oxygen Cardiovascular status: blood pressure returned to baseline and stable Postop Assessment: no apparent nausea or vomiting Anesthetic complications: no   There were no known notable events for this encounter.   Last Vitals:  Vitals:   03/01/24 1336 03/01/24 1438  BP:  (!) 133/97  Pulse: 74 76  Resp: 19 19  Temp:  36.9 C  SpO2: 100% 100%    Last Pain:  Vitals:   03/01/24 1438  TempSrc:   PainSc: 0-No pain                 Aubria Vanecek L Nevayah Faust

## 2024-03-01 NOTE — Brief Op Note (Addendum)
 03/01/2024  2:35 PM  PATIENT:  Raymond Barrera.  63 y.o. male  PRE-OPERATIVE DIAGNOSIS:  IDA  POST-OPERATIVE DIAGNOSIS:  EGD: gastric erosions, hiatal hernia COLON: ascending polyp (CS), transverse polyps x2 (CS)  PROCEDURE:  Procedure(s): COLONOSCOPY (N/A) EGD (ESOPHAGOGASTRODUODENOSCOPY) (N/A) POLYPECTOMY  SURGEON:  Surgeons and Role:    * Dolores Frame, MD - Primary  Patient underwent EGD/COLONOSCOPY under propofol sedation.  Tolerated the procedure adequately.   EGD FINDINGS: - 1 cm hiatal hernia.  - Erosive gastropathy with no stigmata of recent bleeding.  Biopsied.  - Normal examined duodenum.  Biopsied.   COLONOSCOPY FINDINGS: - Preparation of the colon was fair.  - Three 3 to 5 mm polyps in the transverse colon, removed with a cold snare.  Resected and retrieved.  Clip manufacturer: [employer].  Clip (MR safe) was placed.  - Non-bleeding internal hemorrhoids.   RECOMMENDATIONS - Return patient to hospital ward for ongoing care.  - Clear liquid diet today. Npo after midnight. - Proceed with capsule endoscopy tomorrow. - Await pathology results.  - Daily H/H. - Repeat colonoscopy in 1 year because the bowel preparation was suboptimal.  - Avoid Cocaine and NSAID use. - Keep on ASA 81 mg for next 5 days, then restart Plavix if stable.  Katrinka Blazing, MD Gastroenterology and Hepatology [facility]

## 2024-03-01 NOTE — Progress Notes (Signed)
 [facility] PROGRESS NOTE  Raymond Barrera. (DOB: 01/15/1961) RUE:[id number] PCP: Anabel Halon, MD  Brief Narrative:  Raymond Barrera. is a 63 y.o. 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 He was noted to be anemia (hgb 6.9g/dl) due to presumed chronic GI blood loss. 1u RBCs given and GI consulted, pending EGD and colonoscopy 4/1.   Subjective: Completed much but not all of bowel prep, no bleeding noted, feeling fine. Spouse at bedside  Objective: BP (!) 136/91   Pulse 78   Temp 98.1 F (36.7 C)   Resp 18   Ht 6\' 6"  (1.981 m)   Wt 109.4 kg   SpO2 100%   BMI 27.87 kg/m   Gen: No distress Pulm: Clear, nonlabored  CV: RRR, no MRG or edema GI: Soft, NT, ND, +BS Neuro: Alert and oriented. No new focal deficits. Ext: Warm, no deformities. Skin: No rashes, lesions or ulcers on visualized skin   Assessment & Plan: Symptomatic anemia due to chronic GI blood loss:  - GI consulted, planning EGD, colonoscopy 4/1.  - Continue PPI IV q12h - 1u RBCs given 3/31, hgb still 6.9g/dl when rechecked, additional 1u RBCs ordered. VSS.   - Consider IV iron prior to discharge   Dizziness: Suspected to be due to symptomatic anemia.  - Continue dose-reduced gabapentin 300mg  qHS as increase to 400mg  BID coincides with symptom timeline. Negative neuroimaging.   History of CVA, HLD:  - Continue statin - Holding DAPT for right now pending GI work up.    HTN, history of HFrEF: - Clinically compensated currently, no recent symptoms of CHF. Will hold jardiance for now. Entresto not on list due to CKD.  - Continue norvasc (has allergy listed to coreg).     T2DM with diabetic peripheral neuropathy: Last HbA1c 6.8%.  - SSI, hold jardiance pre-procedurally  - Continue  cymbalta   RLS:  - Continue requip   Stage IIIa CKD, hx partial left nephrectomy for RCC:   - Avoid nephrotoxins, NSAIDs - Recheck in AM.    Asthma/COPD: Quiescent.  - Continue controlled medication and prn albuterol   History of cocaine use:  - UDS negative  Raymond Nine, MD [facility] [email or web address] 03/01/2024, 11:46 AM

## 2024-03-01 NOTE — Plan of Care (Signed)
  Problem: Education: Goal: Knowledge of General Education information will improve Description Including pain rating scale, medication(s)/side effects and non-pharmacologic comfort measures Outcome: Not Progressing   Problem: Health Behavior/Discharge Planning: Goal: Ability to manage health-related needs will improve Outcome: Not Progressing   Problem: Nutrition: Goal: Adequate nutrition will be maintained Outcome: Not Progressing   Problem: Coping: Goal: Level of anxiety will decrease Outcome: Not Progressing   

## 2024-03-01 NOTE — Care Management Obs Status (Signed)
 MEDICARE OBSERVATION STATUS NOTIFICATION   Patient Details  Name: Raymond Barrera. MRN: [id number] Date of Birth: 1961/02/26   Medicare Observation Status Notification Given:  Yes (signed by spouse and caregiver [name] Margo Aye)    Corey Harold 03/01/2024, 4:11 PM

## 2024-03-02 ENCOUNTER — Ambulatory Visit: Payer: MEDICAID | Admitting: Cardiology

## 2024-03-02 ENCOUNTER — Encounter (HOSPITAL_COMMUNITY): Payer: Self-pay | Admitting: Gastroenterology

## 2024-03-02 ENCOUNTER — Encounter (HOSPITAL_COMMUNITY)
Admission: EM | Disposition: A | Discharge status: Home / Self Care | Payer: Self-pay | Source: Home / Self Care | Attending: Emergency Medicine

## 2024-03-02 DIAGNOSIS — D649 Anemia, unspecified: Secondary | ICD-10-CM | POA: Diagnosis not present

## 2024-03-02 DIAGNOSIS — D509 Iron deficiency anemia, unspecified: Secondary | ICD-10-CM | POA: Diagnosis not present

## 2024-03-02 HISTORY — PX: GIVENS CAPSULE STUDY: SHX5432

## 2024-03-02 LAB — TYPE AND SCREEN
ABO/RH(D): O POS
Antibody Screen: NEGATIVE
Unit division: 0
Unit division: 0

## 2024-03-02 LAB — BPAM RBC
Blood Product Expiration Date: 202504152359
Blood Product Expiration Date: 202504152359
ISSUE DATE / TIME: 202504010946
ISSUE DATE / TIME: 202503311514
Unit Type and Rh: 5100
Unit Type and Rh: 5100

## 2024-03-02 SURGERY — IMAGING PROCEDURE, GI TRACT, INTRALUMINAL, VIA CAPSULE

## 2024-03-02 NOTE — Plan of Care (Signed)
  Problem: Education: Goal: Knowledge of General Education information will improve Description: Including pain rating scale, medication(s)/side effects and non-pharmacologic comfort measures Outcome: Progressing   Problem: Activity: Goal: Risk for activity intolerance will decrease Outcome: Progressing   Problem: Coping: Goal: Level of anxiety will decrease Outcome: Progressing   Problem: Elimination: Goal: Will not experience complications related to urinary retention Outcome: Progressing   

## 2024-03-02 NOTE — Plan of Care (Signed)

## 2024-03-02 NOTE — Progress Notes (Signed)
 [facility] PROGRESS NOTE  Raymond Barrera. (DOB: 12/18/1960) VWU:[id number] PCP: Anabel Halon, MD  Brief Narrative:  Raymond Dupuis. is a 63 y.o. 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 He was noted to be anemia (hgb 6.9g/dl) due to presumed chronic GI blood loss. 1u RBCs given and GI consulted, pending EGD and colonoscopy 4/1.   Subjective: -Capsule endoscopy deployed on 03/02/2024---Awaiting results -Per GI service okay to eat, patient tolerating oral intake well  Objective: BP (!) 131/90 (BP Location: Right Arm)   Pulse 77   Temp 98.5 F (36.9 C)   Resp 18   Ht 6\' 6"  (1.981 m)   Wt 109.4 kg   SpO2 99%   BMI 27.87 kg/m     Physical Exam  Gen:- Awake Alert, in no acute distress  HEENT:- [place].AT, No sclera icterus Nose- [place] 2L/min Neck-Supple Neck,No JVD,.  Lungs-  CTAB , fair air movement bilaterally  CV- S1, S2 normal, RRR Abd-  +ve B.Sounds, Abd Soft, No tenderness,    Extremity/Skin:- No  edema,   good pedal pulses  Psych-affect is appropriate, oriented x3 Neuro-residual left-sided mild hemiparesis, no additional new focal deficits, no tremors   Assessment & Plan: Symptomatic anemia due to chronic GI blood loss:  - GI consulted, planning EGD, colonoscopy 4/1.  - Continue PPI IV q12h - 1u RBCs given 3/31, hgb still 6.9g/dl when rechecked, additional 1u RBCs ordered. VSS.   - Consider IV iron prior to discharge 03/02/24 =-Capsule endoscopy deployed, results pending   Dizziness: Suspected to be due to symptomatic anemia.  - Continue dose-reduced gabapentin 300mg  qHS as increase to 400mg  BID coincides with symptom timeline. Negative neuroimaging. -Much less dizzy   History of CVA, HLD: --Residual mild left-sided hemiparesis - Continue  statin - Holding DAPT for right now pending GI work up.    HTN, history of HFrEF: - Clinically compensated currently, no recent symptoms of CHF. Will hold jardiance for now. Entresto not on list due to CKD.  - Continue norvasc (has allergy listed to coreg).     T2DM with diabetic peripheral neuropathy: Last HbA1c 6.8%.  - SSI, hold jardiance pre-procedurally  - Continue cymbalta   RLS:  - Continue requip   Stage IIIa CKD, hx partial left nephrectomy for RCC:   - Avoid nephrotoxins, NSAIDs    Asthma/COPD: Quiescent.  - Continue controlled medication and prn albuterol   History of cocaine use:  - UDS negative  Shon Hale, MD [facility] [email or web address] 03/02/2024, 7:17 PM

## 2024-03-03 ENCOUNTER — Telehealth: Payer: Self-pay | Admitting: Gastroenterology

## 2024-03-03 ENCOUNTER — Encounter (HOSPITAL_COMMUNITY): Payer: Self-pay | Admitting: Internal Medicine

## 2024-03-03 DIAGNOSIS — D509 Iron deficiency anemia, unspecified: Secondary | ICD-10-CM | POA: Diagnosis not present

## 2024-03-03 DIAGNOSIS — D649 Anemia, unspecified: Secondary | ICD-10-CM | POA: Diagnosis not present

## 2024-03-03 DIAGNOSIS — K31819 Angiodysplasia of stomach and duodenum without bleeding: Secondary | ICD-10-CM | POA: Diagnosis not present

## 2024-03-03 LAB — CBC
HCT: 30.9 % — ABNORMAL LOW (ref 39.0–52.0)
Hemoglobin: 8.4 g/dL — ABNORMAL LOW (ref 13.0–17.0)
MCH: 19.3 pg — ABNORMAL LOW (ref 26.0–34.0)
MCHC: 27.2 g/dL — ABNORMAL LOW (ref 30.0–36.0)
MCV: 71 fL — ABNORMAL LOW (ref 80.0–100.0)
Platelets: 417 10*3/uL — ABNORMAL HIGH (ref 150–400)
RBC: 4.35 MIL/uL (ref 4.22–5.81)
RDW: 24.5 % — ABNORMAL HIGH (ref 11.5–15.5)
WBC: 7.8 10*3/uL (ref 4.0–10.5)
nRBC: 0 % (ref 0.0–0.2)

## 2024-03-03 LAB — BASIC METABOLIC PANEL WITH GFR
Anion gap: 7 (ref 5–15)
BUN: 17 mg/dL (ref 8–23)
CO2: 27 mmol/L (ref 22–32)
Calcium: 11.2 mg/dL — ABNORMAL HIGH (ref 8.9–10.3)
Chloride: 102 mmol/L (ref 98–111)
Creatinine, Ser: 1.86 mg/dL — ABNORMAL HIGH (ref 0.61–1.24)
GFR, Estimated: 40 mL/min — ABNORMAL LOW (ref >60)
Glucose, Bld: 138 mg/dL — ABNORMAL HIGH (ref 70–99)
Potassium: 5.1 mmol/L (ref 3.5–5.1)
Sodium: 136 mmol/L (ref 135–145)

## 2024-03-03 LAB — SURGICAL PATHOLOGY

## 2024-03-03 MED ORDER — AMLODIPINE BESYLATE 2.5 MG PO TABS
2.5000 mg | ORAL_TABLET | Freq: Every day | ORAL | 3 refills | Status: DC
Start: 2024-03-03 — End: 2024-08-05

## 2024-03-03 MED ORDER — PANTOPRAZOLE SODIUM 40 MG PO TBEC: 40.0000 mg | DELAYED_RELEASE_TABLET | Freq: Two times a day (BID) | ORAL | 3 refills | Status: DC

## 2024-03-03 MED ORDER — ANORO ELLIPTA 62.5-25 MCG/ACT IN AEPB: 1.0000 | INHALATION_SPRAY | Freq: Every day | RESPIRATORY_TRACT | 3 refills | Status: DC

## 2024-03-03 MED ORDER — ASPIRIN 81 MG PO TBEC: 81.0000 mg | DELAYED_RELEASE_TABLET | Freq: Every day | ORAL | 12 refills | Status: AC

## 2024-03-03 MED ORDER — FERROUS SULFATE 325 (65 FE) MG PO TBEC: 325.0000 mg | DELAYED_RELEASE_TABLET | ORAL | 3 refills | Status: DC

## 2024-03-03 MED ORDER — CLOPIDOGREL BISULFATE 75 MG PO TABS: 75.0000 mg | ORAL_TABLET | Freq: Every day | ORAL | 11 refills | Status: DC

## 2024-03-03 MED ORDER — ACETAMINOPHEN 325 MG PO TABS: 650.0000 mg | ORAL_TABLET | Freq: Four times a day (QID) | ORAL | Status: AC | PRN

## 2024-03-03 MED ORDER — ATORVASTATIN CALCIUM 40 MG PO TABS: 40.0000 mg | ORAL_TABLET | Freq: Every day | ORAL | 3 refills | Status: AC

## 2024-03-03 MED ORDER — ALBUTEROL SULFATE HFA 108 (90 BASE) MCG/ACT IN AERS: 2.0000 | INHALATION_SPRAY | Freq: Four times a day (QID) | RESPIRATORY_TRACT | 2 refills | Status: AC | PRN

## 2024-03-03 NOTE — Telephone Encounter (Signed)
 Patient discharged today. He had several proximal small bowel angiectasias.   -->Dr. Tasia Catchings is requesting setting him up for push enteroscopy. ASA 4.  Hold plavix five days.  Hold Jardiance 72 hours.   He will need updated UDS couple days before given prior history of cocaine use.  -->hematology referral for IDA, iron infusions  -->CBC in one week

## 2024-03-03 NOTE — Telephone Encounter (Signed)
 Routing for hematology referral and CBC.

## 2024-03-03 NOTE — Op Note (Signed)
  Small Bowel Givens Capsule Study Procedure date:  03/02/2024  PCP:  Dr. Allena Katz, Earlie Lou, MD  Indication for procedure:  IDA, transferred dependent.  EGD this admission with 1 cm hiatal hernia, erosive gastropathy, pathology pending.  Colonoscopy completed with fair preparation of the colon.Three 3 to 5 mm polyps removed from the transverse colon.  Nonbleeding internal hemorrhoids. Pathology pending.  Patient data:  Wt: 241 pounds Ht: 6 feet 6 inches  Findings: Study complete, prep was good. Small bowel capsule reached the colon during study.  No active bleeding or old blood noted.  He had several small nonbleeding small bowel angiectasias (28 minutes 53 seconds, 32 minutes 50 seconds, 34 minutes 59 seconds, 36 minutes 1 second, 38 minutes 11 seconds, 40 minutes 18 seconds). Couple of benign lymphangiectasia.  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    Recommend push enteroscopy for treatment of proximal small bowel angiectasias. He will need to have negative UDS prior to procedure, urged ongoing cocaine cessation.   Referral to hematology for management of IDA.  Patient would benefit from IV iron. Recommend oral iron every other day.  Given stroke risks, he can resume aspirin now.  If stable in 5 days, may resume Plavix at that time.  Will need closely monitoring of anemia.  Pertinent images reviewed with Dr. Tasia Catchings.  Leanna Battles. Dixon Boos [facility] [phone] 4/3/20254:01 PM

## 2024-03-03 NOTE — Discharge Summary (Signed)
 Raymond Barrera., is a 63 y.o. male  DOB [birth year]  MRN [id number].  Admission date:  02/29/2024  Admitting Physician  Tyrone Nine, MD  Discharge Date:  03/03/2024   Primary MD  Anabel Halon, MD  Recommendations for primary care physician for things to follow:  1)Avoid ibuprofen/Advil/Aleve/Motrin/Goody Powders/Naproxen/BC powders/Meloxicam/Diclofenac/Indomethacin and other Nonsteroidal anti-inflammatory medications as these will make you more likely to bleed and can cause stomach ulcers, can also cause Kidney problems.   2)Repeat CBC and BMP Blood Tests around Tuesday 03/09/24  3)The gastroenterology office will contact you to schedule push enteroscopy procedure for you as an outpatient --Please Follow up with Gastroenterologist Dr. Levon Hedger-- address [address], [place] [address],,XBJYN Number [phone]     4)Please follow-up with hematologist/oncologist Dr. Doreatha Massed, MD---for possible IV iron infusions -Address: inside [facility] (4th Floor), [address] [place], [place], [place] [address] Phone: [phone]  Admission Diagnosis  Dizziness [R42] Weakness [R53.1] Elevated troponin [R79.89] Symptomatic anemia [D64.9] Anemia, unspecified type [D64.9]   Discharge Diagnosis  Dizziness [R42] Weakness [R53.1] Elevated troponin [R79.89] Symptomatic anemia [D64.9] Anemia, unspecified type [D64.9]    Principal Problem:   Symptomatic anemia Active Problems:   History of CVA (cerebrovascular accident)   Essential hypertension   Type 2 diabetes mellitus with other specified complication (HCC)   CKD (chronic kidney disease), stage III (HCC)   Hyperlipidemia   Chronic systolic heart failure (HCC)   COPD (chronic obstructive pulmonary disease) (HCC)   Iron deficiency anemia due to chronic blood loss   Peripheral neuropathy   Dizziness      Past Medical History:   Diagnosis Date   Ambulates with cane    straight - uses occasionally   Anxiety    due to the stroke   Arthritis    Back pain    hx of buldging disc   Complication of anesthesia    took a while for him to wake up after previous anesthesia   CVA (cerebral vascular accident) (HCC) 10/07/2020   Diabetes mellitus without complication (HCC)    Family history of adverse reaction to anesthesia    "sometimes mom has a hard time waking up"   High cholesterol    takes Zocor daily   Hypertension    takes Benazepril and HCTZ  daily   Joint pain    Joint swelling    Memory impairment    occassional - from stroke   Myocardial infarction (HCC) 1987   Pneumonia    hx of-80's   Shortness of breath dyspnea    do to pain   Sleep apnea    never had a sleep study,but states Dr. Janna Arch says he has it   Slurred speech    Stroke (HCC) 08/2013   7 mini-strokes, last stroke 2017   TIA (transient ischemic attack) 2014   x 7    Urinary frequency     Past Surgical History:  Procedure Laterality Date   ABDOMINAL EXPOSURE N/A 06/16/2018   Procedure:  ABDOMINAL EXPOSURE;  Surgeon: Larina Earthly, MD;  Location: [facility] OR;  Service: Vascular;  Laterality: N/A;   ANKLE SURGERY  2008   left ankle-otif-[facility]   ANTERIOR LUMBAR FUSION Bilateral 06/16/2018   Procedure: LUMBAR 4-5 LUMBAR 5-SACRUM 1 ANTERIOR LUMBAR INTERBODY FUSION WITH INSTRUMENTATION AND ALLOGRAFT;  Surgeon: Estill Bamberg, MD;  Location: [facility] OR;  Service: Orthopedics;  Laterality: Bilateral;   BACK SURGERY     COLONOSCOPY N/A 03/01/2024   Procedure: COLONOSCOPY;  Surgeon: Dolores Frame, MD;  [facility]: [facility];  Service: Gastroenterology;  Laterality: N/A;   ESOPHAGOGASTRODUODENOSCOPY N/A 03/01/2024   Procedure: EGD (ESOPHAGOGASTRODUODENOSCOPY);  Surgeon: Marguerita Merles, Reuel Boom, MD;  Location: [facility];  Service: Gastroenterology;  Laterality: N/A;   GIVENS CAPSULE STUDY N/A 03/02/2024   Procedure: IMAGING PROCEDURE, GI  TRACT, INTRALUMINAL, VIA CAPSULE;  Surgeon: Lanelle Bal, DO;  Location: [facility];  Service: Endoscopy;  Laterality: N/A;   HEMATOMA EVACUATION Left 08/13/2018   Procedure: EVACUATION HEMATOMA LEFT ABDOMINAL WALL;  Surgeon: Larina Earthly, MD;  Location: [facility] OR;  Service: Vascular;  Laterality: Left;   HEMOSTASIS CLIP PLACEMENT  03/01/2024   Procedure: CONTROL OF HEMORRHAGE, GI TRACT, ENDOSCOPIC, BY CLIPPING OR OVERSEWING;  Surgeon: Dolores Frame, MD;  [facility]: [facility];  Service: Gastroenterology;;   IR RADIOLOGIST EVAL & MGMT  07/27/2018   IR US GUIDE BX ASP/DRAIN  07/14/2018   JOINT REPLACEMENT     both hips replaced    LUMBAR LAMINECTOMY/DECOMPRESSION MICRODISCECTOMY Right 10/17/2014   Procedure: LUMBAR LAMINECTOMY/DECOMPRESSION MICRODISCECTOMY 2 LEVELS;  Surgeon: Lisbeth Renshaw, MD;  Location: [facility] OR[facility];  Service: Neurosurgery;  Laterality: Right;  Right L45 L5S1 laminectomy and foraminotomy   MASS EXCISION  09/13/2012   Procedure: EXCISION MASS;  Surgeon: Dalia Heading, MD;  Location: [facility] ORS;  Service: General;  Laterality: N/A;   POLYPECTOMY  03/01/2024   Procedure: POLYPECTOMY;  Surgeon: Dolores Frame, MD;  [facility];  Service: Gastroenterology;;   ROBOTIC ASSITED PARTIAL NEPHRECTOMY Left 03/30/2019   Procedure: XI ROBOTIC ASSITED PARTIAL NEPHRECTOMY POSSIBLE RADICAL NEPHRECTOMY;  Surgeon: Rene Paci, MD;  Location: [facility] ORS;  Service: Urology;  Laterality: Left;   SHOULDER ARTHROSCOPY WITH ROTATOR CUFF REPAIR Right 04/12/2020   Procedure: right shoulder arthroscopy, debridement, mini open rotator cuff tear repair;  Surgeon: Cammy Copa, MD;  Location: [facility] OR;  Service: Orthopedics;  Laterality: Right;   TONSILLECTOMY     TOTAL HIP ARTHROPLASTY Right 03/20/2015   TOTAL HIP ARTHROPLASTY Right 03/20/2015   Procedure: TOTAL HIP ARTHROPLASTY ANTERIOR APPROACH;  Surgeon: Sheral Apley, MD;  Location: [facility] OR;  Service:  Orthopedics;  Laterality: Right;   TOTAL HIP ARTHROPLASTY Left 01/08/2016   Procedure: TOTAL HIP ARTHROPLASTY ANTERIOR APPROACH;  Surgeon: Sheral Apley, MD;  Location: [facility] OR;  Service: Orthopedics;  Laterality: Left;       HPI  from the history and physical done on the day of admission:   HPI: Pio Eatherly. is a 63 y.o. 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 He reported the sensation of dizziness while home alone yesterday causing fall onto the bed and down to the floor without significant trauma. This persisted and his wife called PCP's office, was referred to the ED. He was also nauseated  most of the past few days, no vomiting, no diarrhea, constipation or noticing blood in stools. No exertional dyspnea, orthopnea, PND.   Vitals were stable on arrival to the ED, neuroimaging was nonacute, and hgb noted to be 6.9g/dl (with microcytic indices and thrombocytosis at 622k). SCr also up from previous at 2.14 with elevated BUN at 34. ECG w/NSR.    GI was consulted given his history of +FOBT and transfusion last month, recommended clear liquid diet, NPO after midnight for endoscopic evaluation 4/1. 1u RBCs ordered and hospitalists consulted for admission.   Review of Systems: As mentioned in the history of present illness. All other systems reviewed and are negative.     Hospital Course:   Brief Narrative:  Ediel Unangst. is a 63 y.o. 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 He was noted to be anemia (hgb 6.9g/dl) due to presumed chronic GI blood loss. 1u RBCs given and GI  consulted, pending EGD and colonoscopy 4/1.   Assessment and Plan: 1)Symptomatic anemia due to chronic GI blood loss:  -Patient received transfusion of PRBC on 02/29/2024 and again on 03/01/2024 -Treated with IV Protonix, ok to dc on oral protonix 40 mg bid  -GI consult appreciated -EGD FINDINGS: on 03/01/24 - 1 cm hiatal hernia.  - Erosive gastropathy with no stigmata of recent bleeding.  Biopsied.  - Normal examined duodenum.  Biopsied.  -COLONOSCOPY FINDINGS: on 03/01/24 - Preparation of the colon was fair.  - Three 3 to 5 mm polyps in the transverse colon, removed with a cold snare.  Resected and retrieved.  Clip manufacturer: [employer].  Clip (MR safe) was placed.  - Non-bleeding internal hemorrhoids. - 1u RBCs given 3/31, hgb still 6.9g/dl when rechecked, additional 1u RBCs ordered. VSS.   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  -Push enteroscopy as outpatient for treatment of proximal small bowel angioectasia advised -OTC iron tablets every other day advised may consider IV iron -Per GI team okay to resume aspirin at this time May resume Plavix in about 5 days if no bleeding --Discharge hemoglobin is 7.9 up from 6.9 on admission on 02/29/2024 -Please note that hemoglobin was 6.2 on 01/27/2024   2)Dizziness: Suspected to be due to symptomatic anemia.  -. Negative neuroimaging. --Much improved posttransfusion   3)History of CVA, HLD: --Residual mild left-sided hemiparesis - Continue statin -Resume Aspirin and Plavix as above #1   4)HTN, history of HFrEF: - Clinically compensated currently, no recent symptoms of CHF.  Resume PTA rx - Continue norvasc (has allergy listed to coreg).     T2DM with diabetic peripheral neuropathy: Last HbA1c 6.8%.  -Resume  jardiance --  --Continue cymbalta for neuropathy   RLS:  - Continue requip   Stage IIIa CKD, hx partial left nephrectomy for RCC:   - Avoid nephrotoxins, NSAIDs   Asthma/COPD: Quiescent.  - Continue controlled medication and prn albuterol   History of cocaine use:  - UDS negative  -Patient is not interested in drug rehab  Discharge Condition: stable  Follow UP   Follow-up Information     Doreatha Massed, MD. Schedule an  appointment as soon as possible for a visit in 1 week(s).   Specialty: Hematology Why: Anemia---- may need IV iron Contact information: [address] [place] [address] [phone]         Marguerita Merles, Reuel Boom, MD. Schedule an appointment as soon as possible for a visit in 1 week(s).   Specialty: Gastroenterology Contact information: [address] [place] [address] [phone]                  Consults obtained - Gi  Diet and Activity recommendation:  As advised  Discharge Instructions    Discharge Instructions     Call MD for:  difficulty breathing, headache or visual disturbances   Complete by: As directed    Call MD for:  persistant dizziness or light-headedness   Complete by: As directed    Call MD for:  persistant nausea and vomiting   Complete by: As directed    Call MD for:  temperature >100.4   Complete by: As directed    Diet - low sodium heart healthy   Complete by: As directed    Discharge instructions   Complete by: As directed    1)Avoid ibuprofen/Advil/Aleve/Motrin/Goody Powders/Naproxen/BC powders/Meloxicam/Diclofenac/Indomethacin and other Nonsteroidal anti-inflammatory medications as these will make you more likely to bleed and can cause stomach ulcers, can also cause Kidney problems.   2)Repeat CBC and BMP Blood Tests around Tuesday 03/09/24  3)The gastroenterology office will contact you to schedule push enteroscopy procedure for you as an outpatient --Please Follow up with  Gastroenterologist Dr. Levon Hedger-- address [address], [address], [place] [address],,UUVOZ Number [phone]     4)Please follow-up with hematologist/oncologist Dr. Doreatha Massed, MD---for possible IV iron infusions -Address: inside [facility] (4th Floor), [address] [place], [place], [place] [address] Phone: [phone]   Increase activity slowly   Complete by: As directed         Discharge Medications     Allergies as of 03/03/2024       Reactions   Poison Ivy Extract [poison Ivy Extract] Itching, Rash   Coreg [carvedilol] Itching   Itching and dizziness        Medication List     TAKE these medications    Accu-Chek Softclix Lancets lancets SMARTSIG:Topical 2-3 Times Daily   acetaminophen 325 MG tablet Commonly known as: TYLENOL Take 2 tablets (650 mg total) by mouth every 6 (six) hours as needed for mild pain (pain score 1-3) (or Fever >/= 101).   albuterol 108 (90 Base) MCG/ACT inhaler Commonly known as: VENTOLIN HFA Inhale 2 puffs into the lungs every 6 (six) hours as needed for wheezing or shortness of breath (cough). What changed: reasons to take this   amLODipine 2.5 MG tablet Commonly known as: NORVASC Take 1 tablet (2.5 mg total) by mouth daily.   Anoro Ellipta 62.5-25 MCG/ACT Aepb Generic drug: umeclidinium-vilanterol Inhale 1 puff into the lungs daily.   aspirin EC 81 MG tablet Take 1 tablet (81 mg total) by mouth daily with breakfast. Swallow whole. What changed: when to take this   atorvastatin 40 MG tablet Commonly known as: LIPITOR Take 1 tablet (40 mg total) by mouth daily.   clopidogrel 75 MG tablet Commonly known as: PLAVIX Take 1 tablet (75 mg total) by mouth daily. Start taking on: March 09, 2024 What changed: These instructions start on March 09, 2024. If you are unsure what to do until then, ask your doctor or other  care provider.   ferrous sulfate 325 (65 FE) MG EC tablet Take 1 tablet (325 mg total) by mouth every  Tuesday, Thursday, Saturday, and Sunday.   gabapentin 300 MG capsule Commonly known as: NEURONTIN Take 1 capsule (300 mg total) by mouth at bedtime.   Jardiance 10 MG Tabs tablet Generic drug: empagliflozin Take 1 tablet (10 mg total) by mouth daily.   pantoprazole 40 MG tablet Commonly known as: PROTONIX Take 1 tablet (40 mg total) by mouth 2 (two) times daily. What changed: when to take this   rOPINIRole 1 MG tablet Commonly known as: REQUIP Take 1 tablet (1 mg total) by mouth at bedtime.        Major procedures and Radiology Reports - PLEASE review detailed and final reports for all details, in brief -   DG Chest Port 1 View Result Date: 02/29/2024 CLINICAL DATA:  Dizziness EXAM: PORTABLE CHEST 1 VIEW COMPARISON:  07/12/2021 FINDINGS: Elevated left hemidiaphragm with some adjacent atelectasis. No pneumothorax, effusion or edema. No consolidation. Enlarged cardiopericardial silhouette. Overlapping cardiac leads. IMPRESSION: Elevated left hemidiaphragm.  Left basilar scar or atelectasis. Electronically Signed   By: Karen Kays M.D.   On: 02/29/2024 13:46   MR Brain Wo Contrast (neuro protocol) Result Date: 02/29/2024 CLINICAL DATA:  63 year old male with head trauma, dizziness, syncope. EXAM: MRI HEAD WITHOUT CONTRAST TECHNIQUE: Multiplanar, multiecho pulse sequences of the brain and surrounding structures were obtained without intravenous contrast. COMPARISON:  Head CT this morning.  Brain MRI 02/04/2023. FINDINGS: Brain: No restricted diffusion to suggest acute infarction. No midline shift, mass effect, evidence of mass lesion, ventriculomegaly, extra-axial collection or acute intracranial hemorrhage. Cervicomedullary junction and pituitary are within normal limits. Chronic lacunar infarcts in the bilateral deep white matter and deep gray nuclei, more pronounced on the left and with direct chronic involvement and encephalomalacia of the corpus callosum. Patchy and confluent  associated additional cerebral white matter T2 and FLAIR hyperintensity. Associated left brainstem Wallerian degeneration. Stable gray and white matter signal since last year. No cortical encephalomalacia identified. Mild left corona radiata hemosiderin. Vascular: Major intracranial vascular flow voids are stable, preserved. Skull and upper cervical spine: Stable visible cervical spine. Visualized bone marrow signal is within normal limits. Sinuses/Orbits: Stable and negative. Other: Visible internal auditory structures appear normal. Normal stylomastoid foramina. Negative visible scalp and face. IMPRESSION: 1. No acute intracranial abnormality. 2. Stable MRI appearance of advanced chronic small vessel disease sequelae since last year. Electronically Signed   By: Odessa Fleming M.D.   On: 02/29/2024 13:29   CT Head Wo Contrast Result Date: 02/29/2024 CLINICAL DATA:  Head trauma with abnormal mental status. Dizziness that is worsening for 2-3 days EXAM: CT HEAD WITHOUT CONTRAST TECHNIQUE: Contiguous axial images were obtained from the base of the skull through the vertex without intravenous contrast. RADIATION DOSE REDUCTION: This exam was performed according to the departmental dose-optimization program which includes automated exposure control, adjustment of the mA and/or kV according to patient size and/or use of iterative reconstruction technique. COMPARISON:  01/13/2023 FINDINGS: Brain: No evidence of acute infarction, hemorrhage, hydrocephalus, extra-axial collection or mass lesion/mass effect. Generalized low-density in the cerebral white matter attributed to chronic small vessel ischemia with chronic perforator infarct at the left corona radiata. Generalized atrophy. Vascular: No hyperdense vessel or unexpected calcification. Skull: Normal. Negative for fracture or focal lesion. Sinuses/Orbits: No acute finding. IMPRESSION: No acute or interval finding. Extensive chronic small vessel ischemia. Electronically  Signed   By: Audry Riles.D.  On: 02/29/2024 12:44   Micro Results   Recent Results (from the past 240 hours)  MRSA Next Gen by PCR, Nasal     Status: None   Collection Time: 02/29/24  4:14 PM   Specimen: Nasal Mucosa; Nasal Swab  Result Value Ref Range Status   MRSA by PCR Next Gen NOT DETECTED NOT DETECTED Final    Comment: (NOTE) The GeneXpert MRSA Assay (FDA approved for NASAL specimens only), is one component of a comprehensive MRSA colonization surveillance program. It is not intended to diagnose MRSA infection nor to guide or monitor treatment for MRSA infections. Test performance is not FDA approved in patients less than 57 years old. Performed at [facility], [address], [place], [place] [address]    Today   Subjective    Edmond Ginsberg today has no new complaints  Dizziness has resolved -Eating and drinking okay  No Nausea, Vomiting or Diarrhea No fever  Or chills          Patient has been seen and examined prior to discharge   Objective   Blood pressure (!) 147/86, pulse 76, temperature 97.7 F (36.5 C), temperature source Oral, resp. rate 18, height 6\' 6"  (1.981 m), weight 109.4 kg, SpO2 100%.   Intake/Output Summary (Last 24 hours) at 03/03/2024 1538 Last data filed at 03/03/2024 0305 Gross per 24 hour  Intake --  Output 1250 ml  Net -1250 ml    Exam Gen:- Awake Alert, in no acute distress  HEENT:- [place].AT, No sclera icterus Neck-Supple Neck,No JVD,.  Lungs-  CTAB , fair air movement bilaterally  CV- S1, S2 normal, RRR Abd-  +ve B.Sounds, Abd Soft, No tenderness,    Extremity/Skin:- No  edema,   good pedal pulses  Psych-affect is appropriate, oriented x3 Neuro-mild residual left-sided hemiparesis, no additional new focal deficits, no tremors   Data Review   CBC w Diff:  Lab Results  Component Value Date   WBC 7.8 03/03/2024   HGB 8.4 (L) 03/03/2024   HGB 6.7 (LL) 01/26/2024   HCT 30.9 (L) 03/03/2024   HCT 24.7 (L) 01/26/2024   PLT 417  (H) 03/03/2024   PLT 764 (H) 01/26/2024   LYMPHOPCT 11 02/29/2024   MONOPCT 6 02/29/2024   EOSPCT 0 02/29/2024   BASOPCT 0 02/29/2024    CMP:  Lab Results  Component Value Date   NA 136 03/03/2024   NA 139 01/26/2024   K 5.1 03/03/2024   CL 102 03/03/2024   CO2 27 03/03/2024   BUN 17 03/03/2024   BUN 28 (H) 01/26/2024   CREATININE 1.86 (H) 03/03/2024   PROT 7.7 02/29/2024   PROT 7.2 01/01/2024   ALBUMIN 3.7 02/29/2024   ALBUMIN 4.4 01/01/2024   BILITOT 0.5 02/29/2024   BILITOT <0.2 01/01/2024   ALKPHOS 79 02/29/2024   AST 15 02/29/2024   ALT 14 02/29/2024  .  Total Discharge time is about 33 minutes  Shon Hale M.D on 03/03/2024 at 3:38 PM  Go to [email or web address] -  for contact info  [facility] - Office  [phone]

## 2024-03-03 NOTE — Plan of Care (Signed)

## 2024-03-03 NOTE — Discharge Instructions (Signed)
 1)Avoid ibuprofen/Advil/Aleve/Motrin/Goody Powders/Naproxen/BC powders/Meloxicam/Diclofenac/Indomethacin and other Nonsteroidal anti-inflammatory medications as these will make you more likely to bleed and can cause stomach ulcers, can also cause Kidney problems.   2)Repeat CBC and BMP Blood Tests around Tuesday 03/09/24  3)The gastroenterology office will contact you to schedule push enteroscopy procedure for you as an outpatient --Please Follow up with Gastroenterologist Dr. Levon Hedger-- address [address], [address], [place] [address],,BJYNW Number [phone]     4)Please follow-up with hematologist/oncologist Dr. Doreatha Massed, MD---for possible IV iron infusions -Address: inside [facility] (4th Floor), [address] [place], [place], [place] [address] Phone: [phone]

## 2024-03-03 NOTE — Telephone Encounter (Signed)
 No ASA 4 availability in April at this time. If I get a cancellation I will call pt to schedule otherwise we will have to wait until May.

## 2024-03-04 NOTE — Telephone Encounter (Signed)
 Referral sent, they will contact patient with apt

## 2024-03-04 NOTE — Telephone Encounter (Signed)
 Lmom for pt to return call

## 2024-03-05 DIAGNOSIS — R002 Palpitations: Secondary | ICD-10-CM | POA: Diagnosis not present

## 2024-03-07 ENCOUNTER — Telehealth: Payer: Self-pay

## 2024-03-07 ENCOUNTER — Encounter (INDEPENDENT_AMBULATORY_CARE_PROVIDER_SITE_OTHER): Payer: Self-pay | Admitting: *Deleted

## 2024-03-07 NOTE — Telephone Encounter (Signed)
-----   Message from [facility] sent at 03/06/2024  5:22 PM EDT ----- Please let Raymond Barrera know that his cardiac monitor showed an average heart rate of 93 bpm, his predominant underlying rhythm was normal sinus rhythm.  There were occasional early beats from the bottom chambers of the heart.  Overall good results.  We will plan to discuss further on follow-up.

## 2024-03-07 NOTE — Telephone Encounter (Signed)
 Left message to call back

## 2024-03-08 ENCOUNTER — Other Ambulatory Visit: Payer: Self-pay

## 2024-03-08 DIAGNOSIS — D500 Iron deficiency anemia secondary to blood loss (chronic): Secondary | ICD-10-CM

## 2024-03-08 DIAGNOSIS — D509 Iron deficiency anemia, unspecified: Secondary | ICD-10-CM

## 2024-03-08 NOTE — Telephone Encounter (Signed)
 Lab was ordered and pt was instructed to have completed.

## 2024-03-09 DIAGNOSIS — D500 Iron deficiency anemia secondary to blood loss (chronic): Secondary | ICD-10-CM | POA: Diagnosis not present

## 2024-03-09 DIAGNOSIS — D509 Iron deficiency anemia, unspecified: Secondary | ICD-10-CM | POA: Diagnosis not present

## 2024-03-09 LAB — CBC WITH DIFFERENTIAL/PLATELET
Absolute Lymphocytes: 1333 {cells}/uL (ref 850–3900)
Absolute Monocytes: 494 {cells}/uL (ref 200–950)
Basophils Absolute: 33 {cells}/uL (ref 0–200)
Basophils Relative: 0.5 %
Eosinophils Absolute: 169 {cells}/uL (ref 15–500)
Eosinophils Relative: 2.6 %
HCT: 28.6 % — ABNORMAL LOW (ref 38.5–50.0)
Hemoglobin: 7.9 g/dL — ABNORMAL LOW (ref 13.2–17.1)
MCH: 19.1 pg — ABNORMAL LOW (ref 27.0–33.0)
MCHC: 27.6 g/dL — ABNORMAL LOW (ref 32.0–36.0)
MCV: 69.1 fL — ABNORMAL LOW (ref 80.0–100.0)
Monocytes Relative: 7.6 %
Neutro Abs: 4472 {cells}/uL (ref 1500–7800)
Neutrophils Relative %: 68.8 %
Platelets: 242 10*3/uL (ref 140–400)
RBC: 4.14 10*6/uL — ABNORMAL LOW (ref 4.20–5.80)
RDW: 24.1 % — ABNORMAL HIGH (ref 11.0–15.0)
Total Lymphocyte: 20.5 %
WBC: 6.5 10*3/uL (ref 3.8–10.8)

## 2024-03-09 LAB — CBC MORPHOLOGY

## 2024-03-14 ENCOUNTER — Ambulatory Visit: Admitting: Cardiology

## 2024-03-14 NOTE — Telephone Encounter (Signed)
 Called patient's wife advised of below they verbalized understanding.

## 2024-03-15 ENCOUNTER — Other Ambulatory Visit (HOSPITAL_COMMUNITY)
Admission: RE | Admit: 2024-03-15 | Discharge: 2024-03-15 | Disposition: A | Discharge status: Home / Self Care | Source: Ambulatory Visit | Attending: Gastroenterology | Admitting: Gastroenterology

## 2024-03-15 ENCOUNTER — Ambulatory Visit (INDEPENDENT_AMBULATORY_CARE_PROVIDER_SITE_OTHER): Admitting: Gastroenterology

## 2024-03-15 ENCOUNTER — Encounter (INDEPENDENT_AMBULATORY_CARE_PROVIDER_SITE_OTHER): Payer: Self-pay | Admitting: *Deleted

## 2024-03-15 ENCOUNTER — Encounter (INDEPENDENT_AMBULATORY_CARE_PROVIDER_SITE_OTHER): Payer: Self-pay | Admitting: Gastroenterology

## 2024-03-15 VITALS — BP 148/86 | HR 65 | Temp 97.3°F | Ht 78.0 in | Wt 241.6 lb

## 2024-03-15 DIAGNOSIS — Z860101 Personal history of adenomatous and serrated colon polyps: Secondary | ICD-10-CM

## 2024-03-15 DIAGNOSIS — K31819 Angiodysplasia of stomach and duodenum without bleeding: Secondary | ICD-10-CM | POA: Insufficient documentation

## 2024-03-15 DIAGNOSIS — D509 Iron deficiency anemia, unspecified: Secondary | ICD-10-CM | POA: Diagnosis not present

## 2024-03-15 DIAGNOSIS — D500 Iron deficiency anemia secondary to blood loss (chronic): Secondary | ICD-10-CM | POA: Diagnosis not present

## 2024-03-15 DIAGNOSIS — Z860100 Personal history of colon polyps, unspecified: Secondary | ICD-10-CM | POA: Insufficient documentation

## 2024-03-15 LAB — CBC WITH DIFFERENTIAL/PLATELET
Abs Immature Granulocytes: 0.02 10*3/uL (ref 0.00–0.07)
Basophils Absolute: 0 10*3/uL (ref 0.0–0.1)
Basophils Relative: 0 %
Eosinophils Absolute: 0.1 10*3/uL (ref 0.0–0.5)
Eosinophils Relative: 2 %
HCT: 30.1 % — ABNORMAL LOW (ref 39.0–52.0)
Hemoglobin: 8 g/dL — ABNORMAL LOW (ref 13.0–17.0)
Immature Granulocytes: 0 %
Lymphocytes Relative: 19 %
Lymphs Abs: 1.3 10*3/uL (ref 0.7–4.0)
MCH: 19.2 pg — ABNORMAL LOW (ref 26.0–34.0)
MCHC: 26.6 g/dL — ABNORMAL LOW (ref 30.0–36.0)
MCV: 72.2 fL — ABNORMAL LOW (ref 80.0–100.0)
Monocytes Absolute: 0.5 10*3/uL (ref 0.1–1.0)
Monocytes Relative: 8 %
Neutro Abs: 4.9 10*3/uL (ref 1.7–7.7)
Neutrophils Relative %: 71 %
Platelets: 230 10*3/uL (ref 150–400)
RBC: 4.17 MIL/uL — ABNORMAL LOW (ref 4.22–5.81)
RDW: 24.7 % — ABNORMAL HIGH (ref 11.5–15.5)
WBC: 7 10*3/uL (ref 4.0–10.5)
nRBC: 0 % (ref 0.0–0.2)

## 2024-03-15 MED ORDER — POLYETHYLENE GLYCOL 3350 17 GM/SCOOP PO POWD: 8.5000 g | Freq: Every day | ORAL | Status: AC

## 2024-03-15 NOTE — Progress Notes (Signed)
 Kanai Berrios Faizan Eugen Jeansonne , M.D. Gastroenterology & Hepatology [facility] Gastroenterology [address] [place], [place] [address] Primary Care Physician: Meldon Sport, MD [address] [place] [address]  Chief Complaint:  Iron deficiency anemia   History of Present Illness: Garrett Mitchum. is a 63 y.o. male  with  history of CVA on ASA/Plavix, hypertension, COPD, diabetes, CKD, RCC status post right nephrectomy who presents for evaluation of Iron deficiency anemia .  Patient had recent hospitalization for symptomatic anemia of hemoglobin 6.9.  He never underwent bidirectional endoscopy without any overt bleeding found 3 TA which were removed.  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  Today patient is here for follow-up in the clinic with wife.  Reports that stool was brown and no blood in stool.  Although wife reports that she has noticed blood-tinged water in the stool.  Patient had not started taking any iron supplementation at this time.  Reports dizziness which he attributes to vertigo and comes only when he gets up quickly or moves fast  Last EGD:03/2024  - 1 cm hiatal hernia. - Erosive gastropathy with no stigmata of recent bleeding. Biopsied. - Normal examined duodenum. Biopsied.   Last Colonoscopy:03/2024  - Preparation of the colon was fair. - Three 3 to 5 mm polyps in the transverse colon, removed with a cold snare. Resected and retrieved. Clip manufacturer: [employer]. Clip ( MR safe) was placed.  Internal hemorrhoids  3 TA's  - Daily H/ H. - Repeat colonoscopy in 1 year because the bowel preparation was suboptimal. - Avoid Cocaine and NSAID use. - Keep on ASA 81 mg for next 5 days, then restart Plavix if stable.  Capsule endoscopy;   Study complete, prep was good. Small bowel capsule reached the colon during study.  No active bleeding or old blood noted.  He had several  small nonbleeding small bowel angiectasias (28 minutes 53 seconds, 32 minutes 50 seconds, 34 minutes 59 seconds, 36 minutes 1 second, 38 minutes 11 seconds, 40 minutes 18 seconds). Couple of benign lymphangiectasia.   Past Medical History: Past Medical History:  Diagnosis Date   Ambulates with cane    straight - uses occasionally   Anxiety    due to the stroke   Arthritis    Back pain    hx of buldging disc   Complication of anesthesia    took a while for him to wake up after previous anesthesia   CVA (cerebral vascular accident) (HCC) 10/07/2020   Diabetes mellitus without complication (HCC)    Family history of adverse reaction to anesthesia    "sometimes mom has a hard time waking up"   High cholesterol    takes Zocor daily   Hypertension    takes Benazepril and HCTZ  daily   Joint pain    Joint swelling    Memory impairment    occassional - from stroke   Myocardial infarction (HCC) 1987   Pneumonia    hx of-80's   Shortness of breath dyspnea    do to pain   Sleep apnea    never had a sleep study,but states Dr. Colby Daub says he has it   Slurred speech    Stroke (HCC) 08/2013   7 mini-strokes, last stroke 2017   TIA (transient ischemic attack) 2014   x 7    Urinary frequency     Past Surgical History: Past Surgical History:  Procedure Laterality Date  ABDOMINAL EXPOSURE N/A 06/16/2018   Procedure: ABDOMINAL EXPOSURE;  Surgeon: Larina Earthly, MD;  Location: [facility] OR;  Service: Vascular;  Laterality: N/A;   ANKLE SURGERY  2008   left ankle-otif-[facility]   ANTERIOR LUMBAR FUSION Bilateral 06/16/2018   Procedure: LUMBAR 4-5 LUMBAR 5-SACRUM 1 ANTERIOR LUMBAR INTERBODY FUSION WITH INSTRUMENTATION AND ALLOGRAFT;  Surgeon: Estill Bamberg, MD;  Location: [facility] OR;  Service: Orthopedics;  Laterality: Bilateral;   BACK SURGERY     COLONOSCOPY N/A 03/01/2024   Procedure: COLONOSCOPY;  Surgeon: Dolores Frame, MD;  Location: [facility] SUITE;  Service: Gastroenterology;   Laterality: N/A;   ESOPHAGOGASTRODUODENOSCOPY N/A 03/01/2024   Procedure: EGD (ESOPHAGOGASTRODUODENOSCOPY);  Surgeon: Marguerita Merles, Reuel Boom, MD;  Location: [facility];  Service: Gastroenterology;  Laterality: N/A;   GIVENS CAPSULE STUDY N/A 03/02/2024   Procedure: IMAGING PROCEDURE, GI TRACT, INTRALUMINAL, VIA CAPSULE;  Surgeon: Lanelle Bal, DO;  Location: [facility];  Service: Endoscopy;  Laterality: N/A;   HEMATOMA EVACUATION Left 08/13/2018   Procedure: EVACUATION HEMATOMA LEFT ABDOMINAL WALL;  Surgeon: Larina Earthly, MD;  Location: [facility] OR;  Service: Vascular;  Laterality: Left;   HEMOSTASIS CLIP PLACEMENT  03/01/2024   Procedure: CONTROL OF HEMORRHAGE, GI TRACT, ENDOSCOPIC, BY CLIPPING OR OVERSEWING;  Surgeon: Dolores Frame, MD;  Location: [facility];  Service: Gastroenterology;;   IR RADIOLOGIST EVAL & MGMT  07/27/2018   IR US GUIDE BX ASP/DRAIN  07/14/2018   JOINT REPLACEMENT     both hips replaced    LUMBAR LAMINECTOMY/DECOMPRESSION MICRODISCECTOMY Right 10/17/2014   Procedure: LUMBAR LAMINECTOMY/DECOMPRESSION MICRODISCECTOMY 2 LEVELS;  Surgeon: Lisbeth Renshaw, MD;  Location: [facility]UR[facility] ORS;  Service: Neurosurgery;  Laterality: Right;  Right L45 L5S1 laminectomy and foraminotomy   MASS EXCISION  09/13/2012   Procedure: EXCISION MASS;  Surgeon: Dalia Heading, MD;  Location: [facility] ORS;  Service: General;  Laterality: N/A;   POLYPECTOMY  03/01/2024   Procedure: POLYPECTOMY;  Surgeon: Dolores Frame, MD;  Location: [facility];  Service: Gastroenterology;;   ROBOTIC ASSITED PARTIAL NEPHRECTOMY Left 03/30/2019   Procedure: XI ROBOTIC ASSITED PARTIAL NEPHRECTOMY POSSIBLE RADICAL NEPHRECTOMY;  Surgeon: Rene Paci, MD;  Location: [facility] ORS;  Service: Urology;  Laterality: Left;   SHOULDER ARTHROSCOPY WITH ROTATOR CUFF REPAIR Right 04/12/2020   Procedure: right shoulder arthroscopy, debridement, mini open rotator cuff tear repair;  Surgeon: Cammy Copa, MD;  Location[facility];  Service: Orthopedics;  Laterality: Right;   TONSILLECTOMY     TOTAL HIP ARTHROPLASTY Right 03/20/2015   TOTAL HIP ARTHROPLASTY Right 03/20/2015   Procedure: TOTAL HIP ARTHROPLASTY ANTERIOR APPROACH;  Surgeon: Sheral Apley, MD;  Location: [facility] OR;  Service: Orthopedics;  Laterality: Right;   TOTAL HIP ARTHROPLASTY Left 01/08/2016   Procedure: TOTAL HIP ARTHROPLASTY ANTERIOR APPROACH;  Surgeon: Sheral Apley, MD;  Location: [facility] OR;  Service: Orthopedics;  Laterality: Left;    Family History: Family History  Problem Relation Age of Onset   Heart disease Father    Colon cancer Neg Hx    Colon polyps Neg Hx     Social History: Social History   Tobacco Use  Smoking Status Some Days   Current packs/day: 0.25   Average packs/day: 0.3 packs/day for 47.0 years (11.8 ttl pk-yrs)   Types: Cigarettes   Passive exposure: Current  Smokeless Tobacco Never  Tobacco Comments   Currently smokes about 2 cigarettes per day.  07/14/2023 hfb   Social History   Substance and  Sexual Activity  Alcohol Use Not Currently   Comment: quit 2012   Social History   Substance and Sexual Activity  Drug Use Not Currently   Types: Cocaine   Comment: many yrs ago., last time- late 2016    Allergies: Allergies  Allergen Reactions   Poison Ivy Extract [Poison Ivy Extract] Itching and Rash   Coreg [Carvedilol] Itching    Itching and dizziness    Medications: Current Outpatient Medications  Medication Sig Dispense Refill   Accu-Chek Softclix Lancets lancets SMARTSIG:Topical 2-3 Times Daily     acetaminophen (TYLENOL) 325 MG tablet Take 2 tablets (650 mg total) by mouth every 6 (six) hours as needed for mild pain (pain score 1-3) (or Fever >/= 101).     albuterol (VENTOLIN HFA) 108 (90 Base) MCG/ACT inhaler Inhale 2 puffs into the lungs every 6 (six) hours as needed for wheezing or shortness of breath (cough). 8 g 2   amLODipine (NORVASC) 2.5 MG tablet Take 1 tablet (2.5  mg total) by mouth daily. 90 tablet 3   aspirin EC 81 MG tablet Take 1 tablet (81 mg total) by mouth daily with breakfast. Swallow whole. 30 tablet 12   atorvastatin (LIPITOR) 40 MG tablet Take 1 tablet (40 mg total) by mouth daily. 90 tablet 3   Camphor-Menthol-Methyl Sal (SALONPAS EX) Apply topically. As needed     clopidogrel (PLAVIX) 75 MG tablet Take 1 tablet (75 mg total) by mouth daily. 30 tablet 11   DULoxetine (CYMBALTA) 30 MG capsule Take 30 mg by mouth daily.     gabapentin (NEURONTIN) 300 MG capsule Take 1 capsule (300 mg total) by mouth at bedtime. 90 capsule 1   JARDIANCE 10 MG TABS tablet Take 1 tablet (10 mg total) by mouth daily. 30 tablet 3   MECLIZINE HCL PO Take by mouth. Takes as needed     pantoprazole (PROTONIX) 40 MG tablet Take 1 tablet (40 mg total) by mouth 2 (two) times daily. 60 tablet 3   polyethylene glycol powder (GLYCOLAX/MIRALAX) 17 GM/SCOOP powder Take 8.5 g by mouth daily.     rOPINIRole (REQUIP) 1 MG tablet Take 1 tablet (1 mg total) by mouth at bedtime. 90 tablet 1   umeclidinium-vilanterol (ANORO ELLIPTA) 62.5-25 MCG/ACT AEPB Inhale 1 puff into the lungs daily. 60 each 3   ferrous sulfate 325 (65 FE) MG EC tablet Take 1 tablet (325 mg total) by mouth every Tuesday, Thursday, Saturday, and Sunday. (Patient not taking: Reported on 03/15/2024) 45 tablet 3   No current facility-administered medications for this visit.    Review of Systems: GENERAL: negative for malaise, night sweats HEENT: No changes in hearing or vision, no nose bleeds or other nasal problems. NECK: Negative for lumps, goiter, pain and significant neck swelling RESPIRATORY: Negative for cough, wheezing CARDIOVASCULAR: Negative for chest pain, leg swelling, palpitations, orthopnea GI: SEE HPI MUSCULOSKELETAL: Negative for joint pain or swelling, back pain, and muscle pain. SKIN: Negative for lesions, rash HEMATOLOGY Negative for prolonged bleeding, bruising easily, and swollen  nodes. ENDOCRINE: Negative for cold or heat intolerance, polyuria, polydipsia and goiter. NEURO: negative for tremor, gait imbalance, syncope and seizures. The remainder of the review of systems is noncontributory.   Physical Exam: BP (!) 148/86   Pulse 65   Temp (!) 97.3 F (36.3 C)   Ht 6\' 6"  (1.981 m)   Wt 241 lb 9.6 oz (109.6 kg)   BMI 27.92 kg/m  GENERAL: The patient is AO x3, in no acute distress.  HEENT: Head is normocephalic and atraumatic. EOMI are intact. Mouth is well hydrated and without lesions. NECK: Supple. No masses LUNGS: Clear to auscultation. No presence of rhonchi/wheezing/rales. Adequate chest expansion HEART: RRR, normal s1 and s2. ABDOMEN: Soft, nontender, no guarding, no peritoneal signs, and nondistended. BS +. No masses.  Imaging/Labs: as above     Latest Ref Rng & Units 03/09/2024   10:06 AM 03/03/2024    9:09 AM 03/01/2024    4:54 AM  CBC  WBC 3.8 - 10.8 Thousand/uL 6.5  7.8  10.0   Hemoglobin 13.2 - 17.1 g/dL 7.9  8.4  7.5   Hematocrit 38.5 - 50.0 % 28.6  30.9  28.5   Platelets 140 - 400 Thousand/uL 242  417  584    Lab Results  Component Value Date   IRON 15 (L) 01/01/2024   TIBC 491 (H) 01/01/2024   FERRITIN 8 (L) 01/01/2024    I personally reviewed and interpreted the available labs, imaging and endoscopic files.  Impression and Plan: Humphrey Guerreiro. is a 63 y.o. male  with  history of CVA on ASA/Plavix, hypertension, COPD, diabetes, CKD, RCC status post right nephrectomy who presents for evaluation of Iron deficiency anemia . 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    Patient is currently not on any adequate iron supplementation as he has not picked up his medications.  Recent blood  work done again last weeks shows downtrending hemoglobin and will repeat a CBC today stat and if it is less than 7 then will refer to ER for blood transfusion   Recommend push enteroscopy for treatment of proximal small bowel angiectasias; and will schedule it today   Referral to hematology for management of IDA.  Patient would benefit from IV iron. Recommend oral iron every other day with MiraLAX daily  I thoroughly discussed with the patient the procedure, including the risks involved. Patient understands what the procedure involves including the benefits and any risks. Patient understands alternatives to the proposed procedure. Risks including (but not limited to) bleeding, tearing of the lining (perforation), rupture of adjacent organs, problems with heart and lung function, infection, and medication reactions. A small percentage of complications may require surgery, hospitalization, repeat endoscopic procedure, and/or transfusion.  Patient understood and agreed.   #HCM  Recent colonoscopy colonoscopy with suboptimal prep, 3 Ta's  Repeat 03/2025  All questions were answered.      Fordyce Lepak Faizan Leanda Padmore, MD Gastroenterology and Hepatology [facility] Gastroenterology   This chart has been completed using [facility] Dictation software, and while attempts have been made to ensure accuracy , certain words and phrases may not be transcribed as intended

## 2024-03-15 NOTE — Progress Notes (Signed)
 Tried to call patient no answer. Sent patient my message. Will hold message to make sure patient gets message.

## 2024-03-15 NOTE — Patient Instructions (Signed)
 It was very nice to meet you today, as dicussed with will plan for the following :  1) Start taking iron 325 mg every other day , but stop it 1 weeks before the procedure  2) Blood work today  3) Hematology office referral  4) Push enteroscopy

## 2024-03-16 ENCOUNTER — Telehealth (INDEPENDENT_AMBULATORY_CARE_PROVIDER_SITE_OTHER): Payer: Self-pay | Admitting: Gastroenterology

## 2024-03-16 NOTE — Telephone Encounter (Signed)
    03/16/24  Raymond Barrera. July 12, 1961  What type of surgery is being performed? Push Enteroscopy  When is surgery scheduled? TBD  What type of clearance is required (medical or pharmacy to hold medication or both? Hold med  Are there any medications that need to be held prior to surgery and how long? Clearance to hold Plavix for 5 days prior to procedure  Name of physician performing surgery?  Dr. Lorie Rook [facility]enterology at [facility] Phone: [phone] Fax: [phone]  Anethesia type (none, local, MAC, general)? Choice

## 2024-03-20 NOTE — Progress Notes (Signed)
 [facility] [address], [place] [address]   Clinic Day:  03/21/2024  Referring physician: Hargis Lias, MD  Patient Care Team: Meldon Sport, MD as PCP - General (Internal Medicine) Luana Rumple, MD as PCP - Cardiology (Cardiology) Virl Grimes, MD as Consulting Physician (Orthopedic Surgery) Adelbert Homans, MD as Consulting Physician (Urology) Merriam Abbey, DO as Consulting Physician (Neurology)   ASSESSMENT & PLAN:   Assessment:  1.  Severe iron  deficiency anemia: - Colonoscopy (03/01/2024): Three 3-5 mm polyps in the transverse colon removed.  Nonbleeding internal hemorrhoids. - EGD (03/08/2024): 1 cm hiatal hernia, erosive gastropathy with no stigmata of recent bleeding, normal duodenum. - Denies BRBPR/melena.  Positive ice pica.  Had to have 4 units PRBC recently.  He was started on oral iron  therapy, which he was told to hold 1 week prior to enteroscopy.  2.  Social/family history: - Lives at home with his wife and also takes care of his mother.  Independent of ADLs and IADLs.  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  Smokes about 2 to 3 cigarettes/day and trying to quit.  Has smoked 6 to 8 cigarettes/day starting at age 31. - Father had colon cancer.   Plan:  1.  Severe iron  deficiency anemia: - He has iron  deficiency anemia likely from blood loss.  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 - We discussed the role of parenteral iron  therapy with INFeD  1 g IV x 1.  We discussed side effects in detail including rare chance of anaphylactic reactions.  We will premedicate him with steroid, H1 and H2 blockers. - RTC 6 weeks for follow-up with repeat CBC, ferritin and iron  panel.  Will also check B12 and folic acid . - She is also scheduled for enteroscopy around 04/02/2024.   Orders Placed This Encounter  Procedures   CBC    Standing Status:   Future    Expected Date:   04/27/2024    Expiration Date:   03/21/2025   Iron  and  TIBC ([facility] ONLY)    Standing Status:   Future    Expected Date:   04/27/2024    Expiration Date:   03/21/2025   Ferritin    Standing Status:   Future    Expected Date:   04/27/2024    Expiration Date:   03/21/2025   Vitamin B12    Standing Status:   Future    Expected Date:   04/27/2024    Expiration Date:   03/21/2025   Folate    Standing Status:   Future    Expected Date:   04/27/2024    Expiration Date:   03/21/2025   Reticulocytes    Standing Status:   Future    Expected Date:   04/27/2024    Expiration Date:   03/21/2025      I,Katie Daubenspeck,acting as a scribe for Paulett Boros, MD.,have documented all relevant documentation on the behalf of Paulett Boros, MD,as directed by  Paulett Boros, MD while in the presence of Paulett Boros, MD.   I, Paulett Boros MD, have reviewed the above documentation for accuracy and completeness, and I agree with the above.   Paulett Boros, MD   4/21/202510:43 AM  CHIEF COMPLAINT/PURPOSE OF CONSULT:   Diagnosis: IDA   Current Therapy:  oral iron  (ferrous sulfate  325)  HISTORY OF PRESENT ILLNESS:   Angel is a 63 y.o. male presenting to clinic today for evaluation of IDA at the request  of Dr. Alita Irwin.  Today, he states that he is doing well overall. His appetite level is at 100%. His energy level is at 75%.  He was found to have abnormal CBC from 10/01/23, with hemoglobin down to 7.9, HCT 28, MCV 75, MCH 21. He is also noted to have CKD. He was referred to GI and recommended to proceed with EGD/colonoscopy. This was delayed due to patient's cocaine use. He was also recommended to start oral iron  but never did. His hgb continued to drop, down to 7 on 01/01/24. He was recommended to go to the ED but never went. A repeat CBC on 01/26/24 showed a further drop to 6.7. He did present to the ED on 01/27/24 and received blood transfusion. A fecal occult test was also positive at that time.  He  presented back to the ED on 02/29/24 with dizziness and was found to have a hemoglobin of 6.9. He received another blood transfusion and was evaluated by GI. He underwent colonoscopy and EGD inpatient on 03/01/24 under Dr. Sammi Crick. Within limitation of suboptimal bowel prep, there was no active bleeding noted, and gastric and small bowel biopsies were negative. He was discharged on oral iron .  His most recent CBC from 03/15/24 showed hgb 8.0.   PAST MEDICAL HISTORY:   Past Medical History: Past Medical History:  Diagnosis Date   Ambulates with cane    straight - uses occasionally   Anxiety    due to the stroke   Arthritis    Back pain    hx of buldging disc   Complication of anesthesia    took a while for him to wake up after previous anesthesia   CVA (cerebral vascular accident) (HCC) 10/07/2020   Diabetes mellitus without complication (HCC)    Family history of adverse reaction to anesthesia    "sometimes mom has a hard time waking up"   High cholesterol    takes Zocor  daily   Hypertension    takes Benazepril  and HCTZ  daily   Joint pain    Joint swelling    Memory impairment    occassional - from stroke   Myocardial infarction (HCC) 1987   Pneumonia    hx of-80's   Shortness of breath dyspnea    do to pain   Sleep apnea    never had a sleep study,but states Dr. Colby Daub says he has it   Slurred speech    Stroke (HCC) 08/2013   7 mini-strokes, last stroke 2017   TIA (transient ischemic attack) 2014   x 7    Urinary frequency     Surgical History: Past Surgical History:  Procedure Laterality Date   ABDOMINAL EXPOSURE N/A 06/16/2018   Procedure: ABDOMINAL EXPOSURE;  Surgeon: Mayo Speck, MD;  Location: [facility] OR;  Service: Vascular;  Laterality: N/A;   ANKLE SURGERY  2008   left ankle-otif-[facility]   ANTERIOR LUMBAR FUSION Bilateral 06/16/2018   Procedure: LUMBAR 4-5 LUMBAR 5-SACRUM 1 ANTERIOR LUMBAR INTERBODY FUSION WITH INSTRUMENTATION AND ALLOGRAFT;  Surgeon: Virl Grimes, MD;  Location: [facility] OR;  Service: Orthopedics;  Laterality: Bilateral;   BACK SURGERY     COLONOSCOPY N/A 03/01/2024   Procedure: COLONOSCOPY;  Surgeon: Urban Garden, MD;  Location: [facility];  Service: Gastroenterology;  Laterality: N/A;   ESOPHAGOGASTRODUODENOSCOPY N/A 03/01/2024   Procedure: EGD (ESOPHAGOGASTRODUODENOSCOPY);  Surgeon: Umberto Ganong, Bearl Limes, MD;  Location: [facility];  Service: Gastroenterology;  Laterality: N/A;   GIVENS CAPSULE STUDY N/A 03/02/2024  Procedure: IMAGING PROCEDURE, GI TRACT, INTRALUMINAL, VIA CAPSULE;  Surgeon: Vinetta Greening, DO;  Location: [facility];  Service: Endoscopy;  Laterality: N/A;   HEMATOMA EVACUATION Left 08/13/2018   Procedure: EVACUATION HEMATOMA LEFT ABDOMINAL WALL;  Surgeon: Mayo Speck, MD;  Location: [facility] OR;  Service: Vascular;  Laterality: Left;   HEMOSTASIS CLIP PLACEMENT  03/01/2024   Procedure: CONTROL OF HEMORRHAGE, GI TRACT, ENDOSCOPIC, BY CLIPPING OR OVERSEWING;  Surgeon: Urban Garden, MD;  Location: [facility];  Service: Gastroenterology;;   IR RADIOLOGIST EVAL & MGMT  07/27/2018   IR US  GUIDE BX ASP/DRAIN  07/14/2018   JOINT REPLACEMENT     both hips replaced    LUMBAR LAMINECTOMY/DECOMPRESSION MICRODISCECTOMY Right 10/17/2014   Procedure: LUMBAR LAMINECTOMY/DECOMPRESSION MICRODISCECTOMY 2 LEVELS;  Surgeon: Augusto Blonder, MD;  Location: [facility];  Service: Neurosurgery;  Laterality: Right;  Right L45 L5S1 laminectomy and foraminotomy   MASS EXCISION  09/13/2012   Procedure: EXCISION MASS;  Surgeon: Beau Bound, MD;  Location: [facility] ORS;  Service: General;  Laterality: N/A;   POLYPECTOMY  03/01/2024   Procedure: POLYPECTOMY;  Surgeon: Urban Garden, MD;  Location: [facility];  Service: Gastroenterology;;   ROBOTIC ASSITED PARTIAL NEPHRECTOMY Left 03/30/2019   Procedure: XI ROBOTIC ASSITED PARTIAL NEPHRECTOMY POSSIBLE RADICAL NEPHRECTOMY;  Surgeon: Adelbert Homans, MD;  Location: [facility] ORS;  Service: Urology;  Laterality: Left;   SHOULDER ARTHROSCOPY WITH ROTATOR CUFF REPAIR Right 04/12/2020   Procedure: right shoulder arthroscopy, debridement, mini open rotator cuff tear repair;  Surgeon: Jasmine Mesi, MD;  Location: [facility] OR;  Service: Orthopedics;  Laterality: Right;   TONSILLECTOMY     TOTAL HIP ARTHROPLASTY Right 03/20/2015   TOTAL HIP ARTHROPLASTY Right 03/20/2015   Procedure: TOTAL HIP ARTHROPLASTY ANTERIOR APPROACH;  Surgeon: Saundra Curl, MD;  Location: [facility] OR;  Service: Orthopedics;  Laterality: Right;   TOTAL HIP ARTHROPLASTY Left 01/08/2016   Procedure: TOTAL HIP ARTHROPLASTY ANTERIOR APPROACH;  Surgeon: Saundra Curl, MD;  Location: [facility] OR;  Service: Orthopedics;  Laterality: Left;    Social History: Social History   Socioeconomic History   Marital status: Married    Spouse name: [name] C.  Faught   Number of children: Not on file   Years of education: Not on file   Highest education level: 12th grade  Occupational History   Not on file  Tobacco Use   Smoking status: Some Days    Current packs/day: 0.25    Average packs/day: 0.3 packs/day for 47.0 years (11.8 ttl pk-yrs)    Types: Cigarettes    Passive exposure: Current   Smokeless tobacco: Never   Tobacco comments:    Currently smokes about 2 cigarettes per day.  07/14/2023 hfb  Vaping Use   Vaping status: Never Used  Substance and Sexual Activity   Alcohol use: Not Currently    Comment: quit 2012   Drug use: Not Currently    Types: Cocaine    Comment: many yrs ago., last time- late 2016   Sexual activity: Yes  Other Topics Concern   Not on file  Social History Narrative   Are you right handed or left handed? Right   Are you currently employed ? disabled   What is your current occupation?   Do you live at home alone? NO   Who lives with you? Mother, Wife   What type of home do you live in: 1 story or 2 story? 1  Social Drivers of Health   Financial  Resource Strain: Medium Risk (12/28/2023)   Overall Financial Resource Strain (CARDIA)    Difficulty of Paying Living Expenses: Somewhat hard  Food Insecurity: Patient Declined (03/01/2024)   Hunger Vital Sign    Worried About Running Out of Food in the Last Year: Patient declined    Ran Out of Food in the Last Year: Patient declined  Recent Concern: Food Insecurity - Food Insecurity Present (12/28/2023)   Hunger Vital Sign    Worried About Running Out of Food in the Last Year: Sometimes true    Ran Out of Food in the Last Year: Sometimes true  Transportation Needs: No Transportation Needs (03/01/2024)   PRAPARE - Administrator, Civil Service (Medical): No    Lack of Transportation (Non-Medical): No  Recent Concern: Transportation Needs - Unmet Transportation Needs (12/28/2023)   PRAPARE - Transportation    Lack of Transportation (Medical): Yes    Lack of Transportation (Non-Medical): Yes  Physical Activity: Insufficiently Active (12/28/2023)   Exercise Vital Sign    Days of Exercise per Week: 1 day    Minutes of Exercise per Session: 10 min  Stress: Stress Concern Present (12/28/2023)   [employer] of Occupational Health - Occupational Stress Questionnaire    Feeling of Stress : To some extent  Social Connections: Unknown (03/01/2024)   Social Connection and Isolation Panel [NHANES]    Frequency of Communication with Friends and Family: Patient declined    Frequency of Social Gatherings with Friends and Family: Patient declined    Attends Religious Services: More than 4 times per year    Active Member of [employer] or Organizations: Yes    Attends Banker Meetings: Patient declined    Marital Status: Married  Catering manager Violence: Unknown (03/01/2024)   Humiliation, Afraid, Rape, and Kick questionnaire    Fear of Current or Ex-Partner: No    Emotionally Abused: No    Physically Abused: Not on file    Sexually Abused: No    Family History: Family History   Problem Relation Age of Onset   Heart disease Father    Colon cancer Neg Hx    Colon polyps Neg Hx     Current Medications:  Current Outpatient Medications:    Accu-Chek Softclix Lancets lancets, SMARTSIG:Topical 2-3 Times Daily, Disp: , Rfl:    albuterol  (VENTOLIN  HFA) 108 (90 Base) MCG/ACT inhaler, Inhale 2 puffs into the lungs every 6 (six) hours as needed for wheezing or shortness of breath (cough)., Disp: 8 g, Rfl: 2   amLODipine  (NORVASC ) 2.5 MG tablet, Take 1 tablet (2.5 mg total) by mouth daily., Disp: 90 tablet, Rfl: 3   aspirin  EC 81 MG tablet, Take 1 tablet (81 mg total) by mouth daily with breakfast. Swallow whole., Disp: 30 tablet, Rfl: 12   atorvastatin  (LIPITOR) 40 MG tablet, Take 1 tablet (40 mg total) by mouth daily., Disp: 90 tablet, Rfl: 3   DULoxetine  (CYMBALTA ) 30 MG capsule, Take 30 mg by mouth daily., Disp: , Rfl:    gabapentin  (NEURONTIN ) 300 MG capsule, Take 1 capsule (300 mg total) by mouth at bedtime., Disp: 90 capsule, Rfl: 1   pantoprazole  (PROTONIX ) 40 MG tablet, Take 1 tablet (40 mg total) by mouth 2 (two) times daily., Disp: 60 tablet, Rfl: 3   rOPINIRole  (REQUIP ) 1 MG tablet, Take 1 tablet (1 mg total) by mouth at bedtime., Disp: 90 tablet, Rfl: 1   umeclidinium-vilanterol (ANORO ELLIPTA ) 62.5-25 MCG/ACT AEPB,  Inhale 1 puff into the lungs daily., Disp: 60 each, Rfl: 3   acetaminophen  (TYLENOL ) 325 MG tablet, Take 2 tablets (650 mg total) by mouth every 6 (six) hours as needed for mild pain (pain score 1-3) (or Fever >/= 101). (Patient not taking: Reported on 03/21/2024), Disp: , Rfl:    Camphor-Menthol -Methyl Sal (SALONPAS EX), Apply topically. As needed (Patient not taking: Reported on 03/21/2024), Disp: , Rfl:    clopidogrel  (PLAVIX ) 75 MG tablet, Take 1 tablet (75 mg total) by mouth daily. (Patient not taking: Reported on 03/21/2024), Disp: 30 tablet, Rfl: 11   ferrous sulfate  325 (65 FE) MG EC tablet, Take 1 tablet (325 mg total) by mouth every Tuesday,  Thursday, Saturday, and Sunday. (Patient not taking: Reported on 03/21/2024), Disp: 45 tablet, Rfl: 3   JARDIANCE  10 MG TABS tablet, Take 1 tablet (10 mg total) by mouth daily. (Patient not taking: Reported on 03/21/2024), Disp: 30 tablet, Rfl: 3   polyethylene glycol powder (GLYCOLAX /MIRALAX ) 17 GM/SCOOP powder, Take 8.5 g by mouth daily. (Patient not taking: Reported on 03/21/2024), Disp: , Rfl:    Allergies: Allergies  Allergen Reactions   Poison Ivy Extract [Poison Ivy Extract] Itching and Rash   Coreg  [Carvedilol ] Itching    Itching and dizziness    REVIEW OF SYSTEMS:   Review of Systems  Constitutional:  Negative for chills, fatigue and fever.  HENT:   Negative for lump/mass, mouth sores, nosebleeds, sore throat and trouble swallowing.   Eyes:  Negative for eye problems.  Respiratory:  Negative for cough and shortness of breath.   Cardiovascular:  Negative for chest pain, leg swelling and palpitations.  Gastrointestinal:  Negative for abdominal pain, constipation, diarrhea, nausea and vomiting.  Genitourinary:  Negative for bladder incontinence, difficulty urinating, dysuria, frequency, hematuria and nocturia.   Musculoskeletal:  Positive for back pain. Negative for arthralgias (Right hip), flank pain, myalgias and neck pain.  Skin:  Negative for itching and rash.  Neurological:  Positive for numbness. Negative for dizziness and headaches.  Hematological:  Does not bruise/bleed easily.  Psychiatric/Behavioral:  Positive for sleep disturbance. Negative for depression and suicidal ideas. The patient is not nervous/anxious.   All other systems reviewed and are negative.    VITALS:   Blood pressure (!) 125/91, pulse 73, temperature 98 F (36.7 C), temperature source Oral, resp. rate 16, height 6\' 6"  (1.981 m), weight 237 lb 7 oz (107.7 kg), SpO2 100%.  Wt Readings from Last 3 Encounters:  03/21/24 237 lb 7 oz (107.7 kg)  03/15/24 241 lb 9.6 oz (109.6 kg)  02/29/24 241 lb 2.9 oz  (109.4 kg)    Body mass index is 27.44 kg/m.   PHYSICAL EXAM:   Physical Exam Vitals and nursing note reviewed. Exam conducted with a chaperone present.  Constitutional:      Appearance: Normal appearance.  Cardiovascular:     Rate and Rhythm: Normal rate and regular rhythm.     Pulses: Normal pulses.     Heart sounds: Normal heart sounds.  Pulmonary:     Effort: Pulmonary effort is normal.     Breath sounds: Normal breath sounds.  Abdominal:     Palpations: Abdomen is soft. There is no hepatomegaly, splenomegaly or mass.     Tenderness: There is no abdominal tenderness.  Musculoskeletal:     Right lower leg: No edema.     Left lower leg: No edema.  Lymphadenopathy:     Cervical: No cervical adenopathy.     Right cervical:  No superficial, deep or posterior cervical adenopathy.    Left cervical: No superficial, deep or posterior cervical adenopathy.     Upper Body:     Right upper body: No supraclavicular or axillary adenopathy.     Left upper body: No supraclavicular or axillary adenopathy.  Neurological:     General: No focal deficit present.     Mental Status: He is alert and oriented to person, place, and time.  Psychiatric:        Mood and Affect: Mood normal.        Behavior: Behavior normal.     LABS:   CBC    Component Value Date/Time   WBC 7.0 03/15/2024 1040   RBC 4.17 (L) 03/15/2024 1040   HGB 8.0 (L) 03/15/2024 1040   HGB 6.7 (LL) 01/26/2024 1620   HCT 30.1 (L) 03/15/2024 1040   HCT 24.7 (L) 01/26/2024 1620   PLT 230 03/15/2024 1040   PLT 764 (H) 01/26/2024 1620   MCV 72.2 (L) 03/15/2024 1040   MCV 67 (L) 01/26/2024 1620   MCH 19.2 (L) 03/15/2024 1040   MCHC 26.6 (L) 03/15/2024 1040   RDW 24.7 (H) 03/15/2024 1040   RDW 19.8 (H) 01/26/2024 1620   LYMPHSABS 1.3 03/15/2024 1040   LYMPHSABS 1.3 01/01/2024 1012   MONOABS 0.5 03/15/2024 1040   EOSABS 0.1 03/15/2024 1040   EOSABS 0.1 01/01/2024 1012   BASOSABS 0.0 03/15/2024 1040   BASOSABS 0.0  01/01/2024 1012    CMP    Component Value Date/Time   NA 136 03/03/2024 0909   NA 139 01/26/2024 1620   K 5.1 03/03/2024 0909   CL 102 03/03/2024 0909   CO2 27 03/03/2024 0909   GLUCOSE 138 (H) 03/03/2024 0909   BUN 17 03/03/2024 0909   BUN 28 (H) 01/26/2024 1620   CREATININE 1.86 (H) 03/03/2024 0909   CALCIUM  11.2 (H) 03/03/2024 0909   PROT 7.7 02/29/2024 1051   PROT 7.2 01/01/2024 1012   ALBUMIN  3.7 02/29/2024 1051   ALBUMIN  4.4 01/01/2024 1012   AST 15 02/29/2024 1051   ALT 14 02/29/2024 1051   ALKPHOS 79 02/29/2024 1051   BILITOT 0.5 02/29/2024 1051   BILITOT <0.2 01/01/2024 1012   GFRNONAA 40 (L) 03/03/2024 0909   GFRAA 40 (L) 04/12/2020 1425    No results found for: "CEA1", "CEA" / No results found for: "CEA1", "CEA" Lab Results  Component Value Date   PSA1 8.0 (H) 10/01/2023   No results found for: "ZOX096" No results found for: "CAN125"  No results found for: "TOTALPROTELP", "ALBUMINELP", "A1GS", "A2GS", "BETS", "BETA2SER", "GAMS", "MSPIKE", "SPEI" Lab Results  Component Value Date   TIBC 491 (H) 01/01/2024   TIBC 307 04/19/2019   FERRITIN 8 (L) 01/01/2024   FERRITIN 169 04/19/2019   IRONPCTSAT 3 (LL) 01/01/2024   IRONPCTSAT 20 04/19/2019   No results found for: "LDH"   STUDIES:   LONG TERM MONITOR (3-14 DAYS) Result Date: 03/05/2024   The dominant rhythm was normal sinus rhythm with normal circadian variation.   There are rare premature supraventricular contractions and there is no evidence of atrial fibrillation or other sustained supraventricular arrhythmia.   There are occasional premature ventricular contractions representing approximately 4% of all recorded beats, rarely occurring as couplets or triplets, sometimes occurring in bigeminy or trigeminy.  There is no evidence of sustained ventricular tachycardia.   There is no evidence of severe bradycardia, sinus pauses or high-grade AV block.   There are no patient triggered  events. Mildly abnormal  arrhythmia monitor due to the occurrence of asymptomatic occasional premature ventricular contractions representing 3.9% of all recorded beats.  There are no events consisting of more than 3 consecutive ventricular beats. No symptoms were recorded. Patch Wear Time:  13 days and 23 hours (2025-03-09T20:26:58-0400 to 2025-03-23T20:26:50-0400) Patient had a min HR of 63 bpm, max HR of 157 bpm, and avg HR of 93 bpm. Predominant underlying rhythm was Sinus Rhythm. Isolated SVEs were rare (<1.0%), SVE Couplets were rare (<1.0%), and no SVE Triplets were present. Isolated VEs were occasional (3.9%, 73376), VE Couplets were rare (<1.0%, 1319), and VE Triplets were rare (<1.0%, 71). Ventricular Bigeminy and Trigeminy were present.   DG Chest Port 1 View Result Date: 02/29/2024 CLINICAL DATA:  Dizziness EXAM: PORTABLE CHEST 1 VIEW COMPARISON:  07/12/2021 FINDINGS: Elevated left hemidiaphragm with some adjacent atelectasis. No pneumothorax, effusion or edema. No consolidation. Enlarged cardiopericardial silhouette. Overlapping cardiac leads. IMPRESSION: Elevated left hemidiaphragm.  Left basilar scar or atelectasis. Electronically Signed   By: Adrianna Horde M.D.   On: 02/29/2024 13:46   MR Brain Wo Contrast (neuro protocol) Result Date: 02/29/2024 CLINICAL DATA:  63 year old male with head trauma, dizziness, syncope. EXAM: MRI HEAD WITHOUT CONTRAST TECHNIQUE: Multiplanar, multiecho pulse sequences of the brain and surrounding structures were obtained without intravenous contrast. COMPARISON:  Head CT this morning.  Brain MRI 02/04/2023. FINDINGS: Brain: No restricted diffusion to suggest acute infarction. No midline shift, mass effect, evidence of mass lesion, ventriculomegaly, extra-axial collection or acute intracranial hemorrhage. Cervicomedullary junction and pituitary are within normal limits. Chronic lacunar infarcts in the bilateral deep white matter and deep gray nuclei, more pronounced on the left and with direct  chronic involvement and encephalomalacia of the corpus callosum. Patchy and confluent associated additional cerebral white matter T2 and FLAIR hyperintensity. Associated left brainstem Wallerian degeneration. Stable gray and white matter signal since last year. No cortical encephalomalacia identified. Mild left corona radiata hemosiderin. Vascular: Major intracranial vascular flow voids are stable, preserved. Skull and upper cervical spine: Stable visible cervical spine. Visualized bone marrow signal is within normal limits. Sinuses/Orbits: Stable and negative. Other: Visible internal auditory structures appear normal. Normal stylomastoid foramina. Negative visible scalp and face. IMPRESSION: 1. No acute intracranial abnormality. 2. Stable MRI appearance of advanced chronic small vessel disease sequelae since last year. Electronically Signed   By: Marlise Simpers M.D.   On: 02/29/2024 13:29   CT Head Wo Contrast Result Date: 02/29/2024 CLINICAL DATA:  Head trauma with abnormal mental status. Dizziness that is worsening for 2-3 days EXAM: CT HEAD WITHOUT CONTRAST TECHNIQUE: Contiguous axial images were obtained from the base of the skull through the vertex without intravenous contrast. RADIATION DOSE REDUCTION: This exam was performed according to the departmental dose-optimization program which includes automated exposure control, adjustment of the mA and/or kV according to patient size and/or use of iterative reconstruction technique. COMPARISON:  01/13/2023 FINDINGS: Brain: No evidence of acute infarction, hemorrhage, hydrocephalus, extra-axial collection or mass lesion/mass effect. Generalized low-density in the cerebral white matter attributed to chronic small vessel ischemia with chronic perforator infarct at the left corona radiata. Generalized atrophy. Vascular: No hyperdense vessel or unexpected calcification. Skull: Normal. Negative for fracture or focal lesion. Sinuses/Orbits: No acute finding. IMPRESSION: No  acute or interval finding. Extensive chronic small vessel ischemia. Electronically Signed   By: Ronnette Coke M.D.   On: 02/29/2024 12:44

## 2024-03-21 ENCOUNTER — Other Ambulatory Visit (HOSPITAL_COMMUNITY)
Admission: RE | Admit: 2024-03-21 | Discharge: 2024-03-21 | Disposition: A | Discharge status: Home / Self Care | Source: Ambulatory Visit | Attending: Gastroenterology | Admitting: Gastroenterology

## 2024-03-21 ENCOUNTER — Inpatient Hospital Stay: Attending: Hematology | Admitting: Hematology

## 2024-03-21 ENCOUNTER — Encounter: Payer: Self-pay | Admitting: Hematology

## 2024-03-21 ENCOUNTER — Inpatient Hospital Stay

## 2024-03-21 VITALS — BP 125/91 | HR 73 | Temp 98.0°F | Resp 16 | Ht 78.0 in | Wt 237.4 lb

## 2024-03-21 DIAGNOSIS — F17210 Nicotine dependence, cigarettes, uncomplicated: Secondary | ICD-10-CM | POA: Insufficient documentation

## 2024-03-21 DIAGNOSIS — D508 Other iron deficiency anemias: Secondary | ICD-10-CM | POA: Diagnosis not present

## 2024-03-21 NOTE — Patient Instructions (Signed)
 You were seen and examined today by Dr. Cheree Cords. Dr. Cheree Cords is a hematologist, meaning that he specializes in blood abnormalities. Dr. Cheree Cords discussed your past medical history, family history of cancers/blood conditions and the events that led to you being here today.  You were referred to Dr. Cheree Cords due to iron  deficiency anemia.  Dr. Katragadda has recommended you have an iron  infusion to help boost your iron  and improve your hemoglobin.  Follow-up as scheduled.

## 2024-03-24 ENCOUNTER — Inpatient Hospital Stay

## 2024-03-24 ENCOUNTER — Other Ambulatory Visit: Payer: Self-pay | Admitting: Internal Medicine

## 2024-03-24 VITALS — BP 167/106 | HR 72 | Temp 98.2°F | Resp 19

## 2024-03-24 DIAGNOSIS — G4761 Periodic limb movement disorder: Secondary | ICD-10-CM

## 2024-03-24 DIAGNOSIS — F17210 Nicotine dependence, cigarettes, uncomplicated: Secondary | ICD-10-CM | POA: Diagnosis not present

## 2024-03-24 DIAGNOSIS — G2581 Restless legs syndrome: Secondary | ICD-10-CM

## 2024-03-24 DIAGNOSIS — D500 Iron deficiency anemia secondary to blood loss (chronic): Secondary | ICD-10-CM

## 2024-03-24 DIAGNOSIS — D508 Other iron deficiency anemias: Secondary | ICD-10-CM | POA: Diagnosis not present

## 2024-03-24 DIAGNOSIS — I10 Essential (primary) hypertension: Secondary | ICD-10-CM

## 2024-03-24 MED ORDER — SODIUM CHLORIDE 0.9 % IV SOLN
950.0000 mg | Freq: Once | INTRAVENOUS | Status: AC
  Administered 2024-03-24: 950 mg via INTRAVENOUS
  Filled 2024-03-24: qty 19

## 2024-03-24 MED ORDER — ROPINIROLE HCL 1 MG PO TABS
1.0000 mg | ORAL_TABLET | Freq: Once | ORAL | Status: AC
  Administered 2024-03-24: 1 mg via ORAL
  Filled 2024-03-24: qty 1

## 2024-03-24 MED ORDER — FAMOTIDINE IN NACL 20-0.9 MG/50ML-% IV SOLN
20.0000 mg | Freq: Once | INTRAVENOUS | Status: AC
  Administered 2024-03-24: 20 mg via INTRAVENOUS
  Filled 2024-03-24: qty 50

## 2024-03-24 MED ORDER — SODIUM CHLORIDE 0.9 % IV SOLN
50.0000 mg | Freq: Once | INTRAVENOUS | Status: AC
  Administered 2024-03-24: 50 mg via INTRAVENOUS
  Filled 2024-03-24: qty 1

## 2024-03-24 MED ORDER — CLONIDINE HCL 0.1 MG PO TABS
0.2000 mg | ORAL_TABLET | Freq: Once | ORAL | Status: AC
  Administered 2024-03-24: 0.2 mg via ORAL
  Filled 2024-03-24: qty 2

## 2024-03-24 MED ORDER — CETIRIZINE HCL 10 MG/ML IV SOLN
10.0000 mg | Freq: Once | INTRAVENOUS | Status: AC
  Administered 2024-03-24: 10 mg via INTRAVENOUS
  Filled 2024-03-24: qty 1

## 2024-03-24 MED ORDER — METHYLPREDNISOLONE SODIUM SUCC 125 MG IJ SOLR
125.0000 mg | Freq: Once | INTRAMUSCULAR | Status: AC
  Administered 2024-03-24: 125 mg via INTRAVENOUS
  Filled 2024-03-24: qty 2

## 2024-03-24 MED ORDER — ACETAMINOPHEN 325 MG PO TABS
650.0000 mg | ORAL_TABLET | Freq: Once | ORAL | Status: AC
  Administered 2024-03-24: 650 mg via ORAL
  Filled 2024-03-24: qty 2

## 2024-03-24 MED ORDER — SODIUM CHLORIDE 0.9 % IV SOLN: Freq: Once | INTRAVENOUS | Status: AC

## 2024-03-24 MED ORDER — ROPINIROLE HCL 1 MG PO TABS: 1.0000 mg | ORAL_TABLET | Freq: Every day | ORAL | 1 refills | Status: DC

## 2024-03-24 NOTE — Telephone Encounter (Signed)
 Copied from CRM [id number]. Topic: Clinical - Medication Refill >> Mar 24, 2024  3:42 PM Alyse July wrote: Most Recent Primary Care Visit:  Provider: Meldon Sport  Department: [facility]  Visit Type: OFFICE VISIT  Date: 02/25/2024  Medication: Reason for CRM: Patient spouse would like a call back to further discuss patient restless leg syndrome.callback number confirmed [phone]  Has the patient contacted their pharmacy? Yes (Agent: If no, request that the patient contact the pharmacy for the refill. If patient does not wish to contact the pharmacy document the reason why and proceed with request.) (Agent: If yes, when and what did the pharmacy advise?)  Is this the correct pharmacy for this prescription? Yes If no, delete pharmacy and type the correct one.  This is the patient's preferred pharmacy:  [id number] - [place], [place] - [address] [place] [address] Phone: [phone] Fax: [phone]  Has the prescription been filled recently? No  Is the patient out of the medication? Yes  Has the patient been seen for an appointment in the last year OR does the patient have an upcoming appointment? Yes  Can we respond through MyChart? Yes  Agent: Please be advised that Rx refills may take up to 3 business days. We ask that you follow-up with your pharmacy.

## 2024-03-24 NOTE — Progress Notes (Signed)
 Patient presents today for iron  infusion.  Patient is in satisfactory condition with no new complaints voiced.  Vital signs are stable.  IV placed in L hand.  IV flushed well with good blood return noted.  We will proceed with infusion per provider orders.    BP elevated at completion of Infed .  MD made aware.  We will give Clonidine  0.2 mg PO x one dose and discharge patient per Dr. Cheree Cords.  Patient tolerated infusion well with no complaints voiced.  Patient left ambulatory with wife in stable condition.  Vital signs stable at discharge.  Follow up as scheduled.

## 2024-03-24 NOTE — Patient Instructions (Signed)
 [facility] [place] - A DEPT OF [facility]  Discharge Instructions: Thank you for choosing [facility] to provide your oncology and hematology care.  If you have a lab appointment with the Cancer Center - please note that after April 8th, 2024, all labs will be drawn in the cancer center.  You do not have to check in or register with the main entrance as you have in the past but will complete your check-in in the cancer center.  Wear comfortable clothing and clothing appropriate for easy access to any Portacath or PICC line.   We strive to give you quality time with your provider. You may need to reschedule your appointment if you arrive late (15 or more minutes).  Arriving late affects you and other patients whose appointments are after yours.  Also, if you miss three or more appointments without notifying the office, you may be dismissed from the clinic at the provider's discretion.      For prescription refill requests, have your pharmacy contact our office and allow 72 hours for refills to be completed.    Today you received the following:  Infed.  Iron Dextran Injection What is this medication? IRON DEXTRAN (EYE ern DEX tran) treats low levels of iron in your body. Iron is a mineral that plays an important role in making red blood cells, which carry oxygen from your lungs to the rest of your body. This medicine may be used for other purposes; ask your health care provider or pharmacist if you have questions. COMMON BRAND NAME(S): Dexferrum, INFeD What should I tell my care team before I take this medication? They need to know if you have any of these conditions: Anemia not caused by low iron levels Heart disease High levels of iron in the blood Kidney disease Liver disease An unusual or allergic reaction to iron, other medications, foods, dyes, or preservatives Pregnant or trying to get pregnant Breastfeeding How should I use this  medication? This medication is injected into a vein or a muscle. It is given by your care team in a hospital or clinic setting. Talk to your care team about the use of this medication in children. While it may be prescribed for children as young as 4 months for selected conditions, precautions do apply. Overdosage: If you think you have taken too much of this medicine contact a poison control center or emergency room at once. NOTE: This medicine is only for you. Do not share this medicine with others. What if I miss a dose? Keep appointments for follow-up doses. It is important not to miss your dose. Call your care team if you are unable to keep an appointment. What may interact with this medication? Do not take this medication with any of the following: Deferoxamine Dimercaprol Other iron products This medication may also interact with the following: Chloramphenicol Deferasirox This list may not describe all possible interactions. Give your health care provider a list of all the medicines, herbs, non-prescription drugs, or dietary supplements you use. Also tell them if you smoke, drink alcohol, or use illegal drugs. Some items may interact with your medicine. What should I watch for while using this medication? Visit your care team for regular checks on your progress. Tell your care team if your symptoms do not start to get better or if they get worse. You may need blood work while taking this medication. You may need to eat more foods that contain iron. Talk  to your care team. Foods that contain iron include whole grains/cereals, dried fruits, beans, peas, leafy green vegetables, and organ meats (liver, kidney). Long-term use of this medication may increase your risk of some cancers. Talk to your care team about your risk of cancer. What side effects may I notice from receiving this medication? Side effects that you should report to your care team as soon as possible: Allergic  reactions--skin rash, itching, hives, swelling of the face, lips, tongue, or throat Low blood pressure--dizziness, feeling faint or lightheaded, blurry vision Shortness of breath Side effects that usually do not require medical attention (report to your care team if they continue or are bothersome): Flushing Headache Joint pain Muscle pain Nausea Pain, redness, or irritation at injection site This list may not describe all possible side effects. Call your doctor for medical advice about side effects. You may report side effects to FDA at 1-800-FDA-1088. Where should I keep my medication? This medication is given in a hospital or clinic. It will not be stored at home. NOTE: This sheet is a summary. It may not cover all possible information. If you have questions about this medicine, talk to your doctor, pharmacist, or health care provider.  2024 Elsevier/Gold Standard (2023-07-08 00:00:00)    To help prevent nausea and vomiting after your treatment, we encourage you to take your nausea medication as directed.  BELOW ARE SYMPTOMS THAT SHOULD BE REPORTED IMMEDIATELY: *FEVER GREATER THAN 100.4 F (38 C) OR HIGHER *CHILLS OR SWEATING *NAUSEA AND VOMITING THAT IS NOT CONTROLLED WITH YOUR NAUSEA MEDICATION *UNUSUAL SHORTNESS OF BREATH *UNUSUAL BRUISING OR BLEEDING *URINARY PROBLEMS (pain or burning when urinating, or frequent urination) *BOWEL PROBLEMS (unusual diarrhea, constipation, pain near the anus) TENDERNESS IN MOUTH AND THROAT WITH OR WITHOUT PRESENCE OF ULCERS (sore throat, sores in mouth, or a toothache) UNUSUAL RASH, SWELLING OR PAIN  UNUSUAL VAGINAL DISCHARGE OR ITCHING   Items with * indicate a potential emergency and should be followed up as soon as possible or go to the Emergency Department if any problems should occur.  Please show the CHEMOTHERAPY ALERT CARD or IMMUNOTHERAPY ALERT CARD at check-in to the Emergency Department and triage nurse.  Should you have questions  after your visit or need to cancel or reschedule your appointment, please contact [facility] [place] - A DEPT OF [facility] [phone]  and follow the prompts.  Office hours are 8:00 a.m. to 4:30 p.m. Monday - Friday. Please note that voicemails left after 4:00 p.m. may not be returned until the following business day.  We are closed weekends and major holidays. You have access to a nurse at all times for urgent questions. Please call the main number to the clinic [phone] and follow the prompts.  For any non-urgent questions, you may also contact your provider using MyChart. We now offer e-Visits for anyone 32 and older to request care online for non-urgent symptoms. For details visit [email or web address].   Also download the MyChart app! Go to the app store, search "MyChart", open the app, select [facility], and log in with your MyChart username and password.

## 2024-03-25 ENCOUNTER — Telehealth: Payer: Self-pay

## 2024-03-25 NOTE — Telephone Encounter (Signed)
 Lmtrc

## 2024-03-25 NOTE — Telephone Encounter (Signed)
 Copied from CRM [id number]. Topic: General - Other >> Mar 24, 2024  3:36 PM Alyse July wrote: Reason for CRM: Patient spouse would like a call back to further discuss patient restless leg syndrome.callback number confirmed [phone]

## 2024-03-28 NOTE — Telephone Encounter (Signed)
Tried calling pt back unable to reach pt ----

## 2024-03-28 NOTE — Patient Instructions (Signed)
 Raymond Barrera.  03/28/2024     @PREFPERIOPPHARMACY @   Your procedure is scheduled on 03/31/2024.   Report to [facility] at 7:00 A.M.   Call this number if you have problems the morning of surgery:   [phone]  If you experience any cold or flu symptoms such as cough, fever, chills, shortness of breath, etc. between now and your scheduled surgery, please notify us  at the above number.   Remember:   Please follow the diet and prep instructions given to you by Dr  John Muzzy office.    You may drink clear liquids until 7:00 AM .  Clear liquids allowed are:                    Water , Juice (No red color; non-citric and without pulp; diabetics please choose diet or no sugar options), Carbonated beverages (diabetics please choose diet or no sugar options), Clear Tea (No creamer, milk, or cream, including half & half and powdered creamer), Black Coffee Only (No creamer, milk or cream, including half & half and powdered creamer), Plain Jell-O Only (No red color; diabetics please choose no sugar options), Clear Sports drink (No red color; diabetics please choose diet or no sugar options), and Plain Popsicles Only (No red color; diabetics please choose no sugar options)    Take these medicines the morning of surgery with A SIP OF WATER  : Norvasc   Cymbalta  Neurontin  and Protonix    Please use you inhaler before you come and bring it with you to the hospital.   Last dose of Plavix  should be on 03/25/2024.   Last dose of Jardiance  should be on 03/27/2024.   Do not take any diabetic meds the am of the procedure.     Do not wear jewelry, make-up or nail polish, including gel polish,  artificial nails, or any other type of covering on natural nails (fingers and  toes).  Do not wear lotions, powders, or perfumes, or deodorant.  Do not shave 48 hours prior to surgery.  Men may shave face and neck.  Do not bring valuables to the hospital.  [facility] is not responsible for any belongings or  valuables.  Contacts, dentures or bridgework may not be worn into surgery.  Leave your suitcase in the car.  After surgery it may be brought to your room.  For patients admitted to the hospital, discharge time will be determined by your treatment team.  Patients discharged the day of surgery will not be allowed to drive home.   Name and phone number of your driver:   Family  Special instructions:  N/A  Please read over the following fact sheets that you were given.  Care and Recovery After Surgery   Upper Endoscopy, Adult Upper endoscopy is a procedure to look inside the upper GI (gastrointestinal) tract. The upper GI tract is made up of: The esophagus. This is the part of the body that moves food from your mouth to your stomach. The stomach. The duodenum. This is the first part of your small intestine. This procedure is also called esophagogastroduodenoscopy (EGD) or gastroscopy. In this procedure, your health care provider passes a thin, flexible tube (endoscope) through your mouth and down your esophagus into your stomach and into your duodenum. A small camera is attached to the end of the tube. Images from the camera appear on a monitor in the exam room. During this procedure, your health care provider may also remove a small piece of  tissue to be sent to a lab and examined under a microscope (biopsy). Your health care provider may do an upper endoscopy to diagnose cancers of the upper GI tract. You may also have this procedure to find the cause of other conditions, such as: Stomach pain. Heartburn. Pain or problems when swallowing. Nausea and vomiting. Stomach bleeding. Stomach ulcers. Tell a health care provider about: Any allergies you have. All medicines you are taking, including vitamins, herbs, eye drops, creams, and over-the-counter medicines. Any problems you or family members have had with anesthetic medicines. Any bleeding problems you have. Any surgeries you have  had. Any medical conditions you have. Whether you are pregnant or may be pregnant. What are the risks? Your healthcare provider will talk with you about risks. These may include: Infection. Bleeding. Allergic reactions to medicines. A tear or hole (perforation) in the esophagus, stomach, or duodenum. What happens before the procedure? When to stop eating and drinking Follow instructions from your health care provider about what you may eat and drink. These may include: 8 hours before your procedure Stop eating most foods. Do not eat meat, fried foods, or fatty foods. Eat only light foods, such as toast or crackers. All liquids are okay except energy drinks and alcohol. 6 hours before your procedure Stop eating. Drink only clear liquids, such as water , clear fruit juice, black coffee, plain tea, and sports drinks. Do not drink energy drinks or alcohol. 2 hours before your procedure Stop drinking all liquids. You may be allowed to take medicines with small sips of water . If you do not follow your health care provider's instructions, your procedure may be delayed or canceled. Medicines Ask your health care provider about: Changing or stopping your regular medicines. This is especially important if you are taking diabetes medicines or blood thinners. Taking medicines such as aspirin  and ibuprofen . These medicines can thin your blood. Do not take these medicines unless your health care provider tells you to take them. Taking over-the-counter medicines, vitamins, herbs, and supplements. General instructions If you will be going home right after the procedure, plan to have a responsible adult: Take you home from the hospital or clinic. You will not be allowed to drive. Care for you for the time you are told. What happens during the procedure?  An IV will be inserted into one of your veins. You may be given one or more of the following: A medicine to help you relax (sedative). A  medicine to numb the throat (local anesthetic). You will lie on your left side on an exam table. Your health care provider will pass the endoscope through your mouth and down your esophagus. Your health care provider will use the scope to check the inside of your esophagus, stomach, and duodenum. Biopsies may be taken. The endoscope will be removed. The procedure may vary among health care providers and hospitals. What happens after the procedure? Your blood pressure, heart rate, breathing rate, and blood oxygen  level will be monitored until you leave the hospital or clinic. When your throat is no longer numb, you may be given some fluids to drink. If you were given a sedative during the procedure, it can affect you for several hours. Do not drive or operate machinery until your health care provider says that it is safe. It is up to you to get the results of your procedure. Ask your health care provider, or the department that is doing the procedure, when your results will be ready. Contact a health  care provider if you: Have a sore throat that lasts longer than 1 day. Have a fever. Get help right away if you: Vomit blood or your vomit looks like coffee grounds. Have bloody, black, or tarry stools. Have a very bad sore throat or you cannot swallow. Have difficulty breathing or very bad pain in your chest or abdomen. These symptoms may be an emergency. Get help right away. Call 911. Do not wait to see if the symptoms will go away. Do not drive yourself to the hospital. Summary Upper endoscopy is a procedure to look inside the upper GI tract. During the procedure, an IV will be inserted into one of your veins. You may be given a medicine to help you relax. The endoscope will be passed through your mouth and down your esophagus. Follow instructions from your health care provider about what you can eat and drink. This information is not intended to replace advice given to you by your health  care provider. Make sure you discuss any questions you have with your health care provider. Document Revised: 02/26/2022 Document Reviewed: 02/26/2022 Elsevier Patient Education  2024 Elsevier Inc.  Monitored Anesthesia Care Anesthesia refers to the techniques, procedures, and medicines that help a person stay safe and comfortable during surgery. Monitored anesthesia care, or sedation, is one type of anesthesia. You may have sedation if you do not need to be asleep for your procedure. Procedures that use sedation may include: Surgery to remove cataracts from your eyes. A dental procedure. A biopsy. This is when a tissue sample is removed and looked at under a microscope. You will be watched closely during your procedure. Your level of sedation or type of anesthesia may be changed to fit your needs. Tell a health care provider about: Any allergies you have. All medicines you are taking, including vitamins, herbs, eye drops, creams, and over-the-counter medicines. Any problems you or family members have had with anesthesia. Any bleeding problems you have. Any surgeries you have had. Any medical conditions or illnesses you have. This includes sleep apnea, cough, fever, or the flu. Whether you are pregnant or may be pregnant. Whether you use cigarettes, alcohol, or drugs. Any use of steroids, whether by mouth or as a cream. What are the risks? Your health care provider will talk with you about risks. These may include: Getting too much medicine (oversedation). Nausea. Allergic reactions to medicines. Trouble breathing. If this happens, a breathing tube may be used to help you breathe. It will be removed when you are awake and breathing on your own. Heart trouble. Lung trouble. Confusion that gets better with time (emergence delirium). What happens before the procedure? When to stop eating and drinking Follow instructions from your health care provider about what you may eat and drink.  These may include: 8 hours before your procedure Stop eating most foods. Do not eat meat, fried foods, or fatty foods. Eat only light foods, such as toast or crackers. All liquids are okay except energy drinks and alcohol. 6 hours before your procedure Stop eating. Drink only clear liquids, such as water , clear fruit juice, black coffee, plain tea, and sports drinks. Do not drink energy drinks or alcohol. 2 hours before your procedure Stop drinking all liquids. You may be allowed to take medicines with small sips of water . If you do not follow your health care provider's instructions, your procedure may be delayed or canceled. Medicines Ask your health care provider about: Changing or stopping your regular medicines. These include any  diabetes medicines or blood thinners you take. Taking medicines such as aspirin  and ibuprofen . These medicines can thin your blood. Do not take them unless your health care provider tells you to. Taking over-the-counter medicines, vitamins, herbs, and supplements. Testing You may have an exam or testing. You may have a blood or urine sample taken. General instructions Do not use any products that contain nicotine  or tobacco for at least 4 weeks before the procedure. These products include cigarettes, chewing tobacco, and vaping devices, such as e-cigarettes. If you need help quitting, ask your health care provider. If you will be going home right after the procedure, plan to have a responsible adult: Take you home from the hospital or clinic. You will not be allowed to drive. Care for you for the time you are told. What happens during the procedure?  Your blood pressure, heart rate, breathing, level of pain, and blood oxygen  level will be monitored. An IV will be inserted into one of your veins. You may be given: A sedative. This helps you relax. Anesthesia. This will: Numb certain areas of your body. Make you fall asleep for surgery. You will be  given medicines as needed to keep you comfortable. The more medicine you are given, the deeper your level of sedation will be. Your level of sedation may be changed to fit your needs. There are three levels of sedation: Mild sedation. At this level, you may feel awake and relaxed. You will be able to follow directions. Moderate sedation. At this level, you will be sleepy. You may not remember the procedure. Deep sedation. At this level, you will be asleep. You will not remember the procedure. How you get the medicines will depend on your age and the procedure. They may be given as: A pill. This may be taken by mouth (orally) or inserted into the rectum. An injection. This may be into a vein or muscle. A spray through the nose. After your procedure is over, the medicine will be stopped. The procedure may vary among health care providers and hospitals. What happens after the procedure? Your blood pressure, heart rate, breathing rate, and blood oxygen  level will be monitored until you leave the hospital or clinic. You may feel sleepy, clumsy, or nauseous. You may not remember what happened during or after the procedure. Sedation can affect you for several hours. Do not drive or use machinery until your health care provider says that it is safe. This information is not intended to replace advice given to you by your health care provider. Make sure you discuss any questions you have with your health care provider. Document Revised: 04/14/2022 Document Reviewed: 04/14/2022 Elsevier Patient Education  2024 [employer].

## 2024-03-28 NOTE — Telephone Encounter (Signed)
 Copied from CRM [id number]. Topic: General - Other >> Mar 25, 2024  3:39 PM Ja-Kwan M wrote: Reason for CRM: Patient spouse returned call to the office. Called [facility] and was told that the staff left at 12 pm. Patient spouse requests call back on Monday.

## 2024-03-29 ENCOUNTER — Encounter (HOSPITAL_COMMUNITY)
Admission: RE | Admit: 2024-03-29 | Discharge: 2024-03-29 | Disposition: A | Discharge status: Home / Self Care | Source: Ambulatory Visit | Attending: Gastroenterology | Admitting: Gastroenterology

## 2024-03-29 ENCOUNTER — Telehealth (INDEPENDENT_AMBULATORY_CARE_PROVIDER_SITE_OTHER): Payer: Self-pay | Admitting: Gastroenterology

## 2024-03-29 DIAGNOSIS — D500 Iron deficiency anemia secondary to blood loss (chronic): Secondary | ICD-10-CM

## 2024-03-29 NOTE — Telephone Encounter (Signed)
 Pt wife left message with office and hospital to reschedule procedure due to being sick. Returned call to pt wife and rescheduled pt to 04/19/24 at 8:15 am. New instructions will be mailed

## 2024-04-14 NOTE — Patient Instructions (Signed)
 Raymond Barrera.  04/14/2024     @PREFPERIOPPHARMACY @   Your procedure is scheduled on  04/19/2024.   Report to [facility] Done at  [phone] A.M.   Call this number if you have problems the morning of surgery:  [phone]  If you experience any cold or flu symptoms such as cough, fever, chills, shortness of breath, etc. between now and your scheduled surgery, please notify us  at the above number.   Remember:        Your last dose of plavix  should be on 04/13/2024.        Your last dose of jardiance  should be on 04/15/2024.         DO NOT take any medications for diabetes the morning of your procedure.    Follow the diet instructions given to you by the office.    You may drink clear liquids until 0400 am on 04/19/2024.    Clear liquids allowed are:                    Water , Juice (No red color; non-citric and without pulp; diabetics please choose diet or no sugar options), Carbonated beverages (diabetics please choose diet or no sugar options), Clear Tea (No creamer, milk, or cream, including half & half and powdered creamer), Black Coffee Only (No creamer, milk or cream, including half & half and powdered creamer), and Clear Sports drink (No red color; diabetics please choose diet or no sugar options)     Take these medicines the morning of surgery with A SIP OF WATER                            amlodipine , duloxetine , pantoprazole .    Do not wear jewelry, make-up or nail polish, including gel polish,  artificial nails, or any other type of covering on natural nails (fingers and  toes).  Do not wear lotions, powders, or perfumes, or deodorant.  Do not shave 48 hours prior to surgery.  Men may shave face and neck.  Do not bring valuables to the hospital.  [facility] is not responsible for any belongings or valuables.  Contacts, dentures or bridgework may not be worn into surgery.  Leave your suitcase in the car.  After surgery it may be brought to your room.  For  patients admitted to the hospital, discharge time will be determined by your treatment team.  Patients discharged the day of surgery will not be allowed to drive home and must have someone with them for 24 hours.    Special instructions:   DO NOT smoke tobacco or vape for 24 hours before your procedure.  Please read over the following fact sheets that you were given. Anesthesia Post-op Instructions and Care and Recovery After Surgery      Upper Endoscopy, Adult, Care After After the procedure, it is common to have a sore throat. It is also common to have: Mild stomach pain or discomfort. Bloating. Nausea. Follow these instructions at home: The instructions below may help you care for yourself at home. Your health care provider may give you more instructions. If you have questions, ask your health care provider. If you were given a sedative during the procedure, it can affect you for several hours. Do not drive or operate machinery until your health care provider says that it is safe. If you will be going home right after the procedure, plan to  have a responsible adult: Take you home from the hospital or clinic. You will not be allowed to drive. Care for you for the time you are told. Follow instructions from your health care provider about what you may eat and drink. Return to your normal activities as told by your health care provider. Ask your health care provider what activities are safe for you. Take over-the-counter and prescription medicines only as told by your health care provider. Contact a health care provider if you: Have a sore throat that lasts longer than one day. Have trouble swallowing. Have a fever. Get help right away if you: Vomit blood or your vomit looks like coffee grounds. Have bloody, black, or tarry stools. Have a very bad sore throat or you cannot swallow. Have difficulty breathing or very bad pain in your chest or abdomen. These symptoms may be an  emergency. Get help right away. Call 911. Do not wait to see if the symptoms will go away. Do not drive yourself to the hospital. Summary After the procedure, it is common to have a sore throat, mild stomach discomfort, bloating, and nausea. If you were given a sedative during the procedure, it can affect you for several hours. Do not drive until your health care provider says that it is safe. Follow instructions from your health care provider about what you may eat and drink. Return to your normal activities as told by your health care provider. This information is not intended to replace advice given to you by your health care provider. Make sure you discuss any questions you have with your health care provider. Document Revised: 02/26/2022 Document Reviewed: 02/26/2022 Elsevier Patient Education  2024 [employer]General Anesthesia, Adult, Care After The following information offers guidance on how to care for yourself after your procedure. Your health care provider may also give you more specific instructions. If you have problems or questions, contact your health care provider. What can I expect after the procedure? After the procedure, it is common for people to: Have pain or discomfort at the IV site. Have nausea or vomiting. Have a sore throat or hoarseness. Have trouble concentrating. Feel cold or chills. Feel weak, sleepy, or tired (fatigue). Have soreness and body aches. These can affect parts of the body that were not involved in surgery. Follow these instructions at home: For the time period you were told by your health care provider:  Rest. Do not participate in activities where you could fall or become injured. Do not drive or use machinery. Do not drink alcohol. Do not take sleeping pills or medicines that cause drowsiness. Do not make important decisions or sign legal documents. Do not take care of children on your own. General instructions Drink enough fluid to  keep your urine pale yellow. If you have sleep apnea, surgery and certain medicines can increase your risk for breathing problems. Follow instructions from your health care provider about wearing your sleep device: Anytime you are sleeping, including during daytime naps. While taking prescription pain medicines, sleeping medicines, or medicines that make you drowsy. Return to your normal activities as told by your health care provider. Ask your health care provider what activities are safe for you. Take over-the-counter and prescription medicines only as told by your health care provider. Do not use any products that contain nicotine  or tobacco. These products include cigarettes, chewing tobacco, and vaping devices, such as e-cigarettes. These can delay incision healing after surgery. If you need help quitting, ask your health care provider.  Contact a health care provider if: You have nausea or vomiting that does not get better with medicine. You vomit every time you eat or drink. You have pain that does not get better with medicine. You cannot urinate or have bloody urine. You develop a skin rash. You have a fever. Get help right away if: You have trouble breathing. You have chest pain. You vomit blood. These symptoms may be an emergency. Get help right away. Call 911. Do not wait to see if the symptoms will go away. Do not drive yourself to the hospital. Summary After the procedure, it is common to have a sore throat, hoarseness, nausea, vomiting, or to feel weak, sleepy, or fatigue. For the time period you were told by your health care provider, do not drive or use machinery. Get help right away if you have difficulty breathing, have chest pain, or vomit blood. These symptoms may be an emergency. This information is not intended to replace advice given to you by your health care provider. Make sure you discuss any questions you have with your health care provider. Document Revised:  02/14/2022 Document Reviewed: 02/14/2022 Elsevier Patient Education  2024 [employer].

## 2024-04-15 ENCOUNTER — Encounter: Payer: Self-pay | Admitting: Family Medicine

## 2024-04-15 ENCOUNTER — Telehealth (INDEPENDENT_AMBULATORY_CARE_PROVIDER_SITE_OTHER): Payer: Self-pay | Admitting: Family Medicine

## 2024-04-15 ENCOUNTER — Encounter (HOSPITAL_COMMUNITY): Payer: Self-pay

## 2024-04-15 ENCOUNTER — Encounter (HOSPITAL_COMMUNITY)
Admission: RE | Admit: 2024-04-15 | Discharge: 2024-04-15 | Disposition: A | Discharge status: Home / Self Care | Source: Ambulatory Visit | Attending: Gastroenterology | Admitting: Gastroenterology

## 2024-04-15 ENCOUNTER — Ambulatory Visit: Payer: Self-pay

## 2024-04-15 ENCOUNTER — Other Ambulatory Visit: Payer: Self-pay

## 2024-04-15 VITALS — BP 163/91 | HR 72 | Resp 18 | Ht 78.0 in | Wt 237.4 lb

## 2024-04-15 VITALS — BP 156/96 | HR 92 | Ht 78.0 in | Wt 248.0 lb

## 2024-04-15 DIAGNOSIS — E1122 Type 2 diabetes mellitus with diabetic chronic kidney disease: Secondary | ICD-10-CM | POA: Diagnosis not present

## 2024-04-15 DIAGNOSIS — M545 Low back pain, unspecified: Secondary | ICD-10-CM | POA: Diagnosis not present

## 2024-04-15 DIAGNOSIS — N1832 Chronic kidney disease, stage 3b: Secondary | ICD-10-CM | POA: Insufficient documentation

## 2024-04-15 DIAGNOSIS — Z01818 Encounter for other preprocedural examination: Secondary | ICD-10-CM | POA: Diagnosis present

## 2024-04-15 DIAGNOSIS — E1169 Type 2 diabetes mellitus with other specified complication: Secondary | ICD-10-CM | POA: Insufficient documentation

## 2024-04-15 DIAGNOSIS — D500 Iron deficiency anemia secondary to blood loss (chronic): Secondary | ICD-10-CM | POA: Insufficient documentation

## 2024-04-15 DIAGNOSIS — M25511 Pain in right shoulder: Secondary | ICD-10-CM

## 2024-04-15 DIAGNOSIS — F1411 Cocaine abuse, in remission: Secondary | ICD-10-CM | POA: Insufficient documentation

## 2024-04-15 DIAGNOSIS — Z01812 Encounter for preprocedural laboratory examination: Secondary | ICD-10-CM | POA: Diagnosis not present

## 2024-04-15 DIAGNOSIS — D631 Anemia in chronic kidney disease: Secondary | ICD-10-CM | POA: Diagnosis not present

## 2024-04-15 LAB — RAPID URINE DRUG SCREEN, HOSP PERFORMED
Amphetamines: NOT DETECTED
Barbiturates: NOT DETECTED
Benzodiazepines: NOT DETECTED
Cocaine: NOT DETECTED
Opiates: NOT DETECTED
Tetrahydrocannabinol: NOT DETECTED

## 2024-04-15 LAB — CBC WITH DIFFERENTIAL/PLATELET
Abs Immature Granulocytes: 0.02 10*3/uL (ref 0.00–0.07)
Basophils Absolute: 0 10*3/uL (ref 0.0–0.1)
Basophils Relative: 1 %
Eosinophils Absolute: 0.2 10*3/uL (ref 0.0–0.5)
Eosinophils Relative: 3 %
HCT: 34 % — ABNORMAL LOW (ref 39.0–52.0)
Hemoglobin: 10 g/dL — ABNORMAL LOW (ref 13.0–17.0)
Immature Granulocytes: 0 %
Lymphocytes Relative: 26 %
Lymphs Abs: 1.4 10*3/uL (ref 0.7–4.0)
MCH: 23.5 pg — ABNORMAL LOW (ref 26.0–34.0)
MCHC: 29.4 g/dL — ABNORMAL LOW (ref 30.0–36.0)
MCV: 80 fL (ref 80.0–100.0)
Monocytes Absolute: 0.4 10*3/uL (ref 0.1–1.0)
Monocytes Relative: 7 %
Neutro Abs: 3.3 10*3/uL (ref 1.7–7.7)
Neutrophils Relative %: 63 %
Platelets: 288 10*3/uL (ref 150–400)
RBC: 4.25 MIL/uL (ref 4.22–5.81)
RDW: 32.8 % — ABNORMAL HIGH (ref 11.5–15.5)
WBC: 5.2 10*3/uL (ref 4.0–10.5)
nRBC: 0 % (ref 0.0–0.2)

## 2024-04-15 LAB — BASIC METABOLIC PANEL WITH GFR
Anion gap: 8 (ref 5–15)
BUN: 23 mg/dL (ref 8–23)
CO2: 22 mmol/L (ref 22–32)
Calcium: 11.3 mg/dL — ABNORMAL HIGH (ref 8.9–10.3)
Chloride: 106 mmol/L (ref 98–111)
Creatinine, Ser: 1.66 mg/dL — ABNORMAL HIGH (ref 0.61–1.24)
GFR, Estimated: 45 mL/min — ABNORMAL LOW (ref >60)
Glucose, Bld: 111 mg/dL — ABNORMAL HIGH (ref 70–99)
Potassium: 4.3 mmol/L (ref 3.5–5.1)
Sodium: 136 mmol/L (ref 135–145)

## 2024-04-15 MED ORDER — PREDNISONE 20 MG PO TABS: 20.0000 mg | ORAL_TABLET | Freq: Two times a day (BID) | ORAL | 0 refills | Status: AC

## 2024-04-15 NOTE — Assessment & Plan Note (Signed)
 Xray ordered Trial on Prednisone   Discussed to focus on maintaining good posture, using lumbar support while sitting, and avoiding prolonged sitting or heavy lifting. Engage in low-impact exercises like walking or swimming to strengthen core muscles and reduce strain on the spine. Apply heat or ice packs as needed for pain relief and consider physical therapy for targeted exercises.

## 2024-04-15 NOTE — Assessment & Plan Note (Signed)
 Xrays ordered Trial on Prednisone  20 mg twice daily x 5 days I explained to the patient that non-pharmacological interventions include the application of ice or heat, rest, and recommended range of motion exercises along with gentle stretching. For pain management, Tylenol  was advised. The patient was instructed to follow up if symptoms worsen or persist. The patient verbalized understanding of the care plan, and all questions were answered.

## 2024-04-15 NOTE — Progress Notes (Signed)
 Virtual Visit via Video Note  I connected with Raymond Barrera. on 04/15/24 at  1:20 PM EDT by a video enabled telemedicine application and verified that I am speaking with the correct person using two identifiers.  Patient Location: Home Provider Location: Office/Clinic  I discussed the limitations, risks, security, and privacy concerns of performing an evaluation and management service by video and the availability of in person appointments. I also discussed with the patient that there may be a patient responsible charge related to this service. The patient expressed understanding and agreed to proceed.  Subjective: PCP: Meldon Sport, MD  Chief Complaint  Patient presents with   Pain    Burning and stabbing Rt. Shoulder blade, arm, and back pain that started last night    Right Shoulder Pain: The patient reports right shoulder pain described as burning, throbbing, and aching. There is no history of trauma to the extremity. The pain is constant and has been gradually worsening over time. Currently, the pain severity is rated 8/10. Associated symptoms include inability to bear weight on the affected arm, joint swelling, limited range of motion, and stiffness. Symptoms are exacerbated by activity. The patient has attempted rest, acetaminophen , and over-the-counter topical ointments without relief.  Back Pain: The patient presents with new-onset, constant aching pain localized to the lumbar spine. The pain has gradually worsened since onset and radiates to both the left and right thighs. Pain severity is reported as 8/10 and remains consistent throughout. Symptoms are aggravated by certain positions and lying down. Associated symptoms include leg pain. Contributing risk factors include poor posture, obesity, and lack of exercise. The patient has trialed gabapentin  for symptom management.       ROS: Per HPI  Current Outpatient Medications:    Accu-Chek Softclix Lancets lancets,  SMARTSIG:Topical 2-3 Times Daily, Disp: , Rfl:    acetaminophen  (TYLENOL ) 325 MG tablet, Take 2 tablets (650 mg total) by mouth every 6 (six) hours as needed for mild pain (pain score 1-3) (or Fever >/= 101)., Disp: , Rfl:    albuterol  (VENTOLIN  HFA) 108 (90 Base) MCG/ACT inhaler, Inhale 2 puffs into the lungs every 6 (six) hours as needed for wheezing or shortness of breath (cough)., Disp: 8 g, Rfl: 2   amLODipine  (NORVASC ) 2.5 MG tablet, Take 1 tablet (2.5 mg total) by mouth daily., Disp: 90 tablet, Rfl: 3   aspirin  EC 81 MG tablet, Take 1 tablet (81 mg total) by mouth daily with breakfast. Swallow whole., Disp: 30 tablet, Rfl: 12   atorvastatin  (LIPITOR) 40 MG tablet, Take 1 tablet (40 mg total) by mouth daily., Disp: 90 tablet, Rfl: 3   Camphor-Menthol -Methyl Sal (SALONPAS EX), Apply topically. As needed, Disp: , Rfl:    DULoxetine  (CYMBALTA ) 30 MG capsule, Take 30 mg by mouth daily., Disp: , Rfl:    ferrous sulfate  325 (65 FE) MG EC tablet, Take 1 tablet (325 mg total) by mouth every Tuesday, Thursday, Saturday, and Sunday., Disp: 45 tablet, Rfl: 3   gabapentin  (NEURONTIN ) 300 MG capsule, Take 1 capsule (300 mg total) by mouth at bedtime., Disp: 90 capsule, Rfl: 1   JARDIANCE  10 MG TABS tablet, Take 1 tablet (10 mg total) by mouth daily., Disp: 30 tablet, Rfl: 3   pantoprazole  (PROTONIX ) 40 MG tablet, Take 1 tablet (40 mg total) by mouth 2 (two) times daily., Disp: 60 tablet, Rfl: 3   polyethylene glycol powder (GLYCOLAX /MIRALAX ) 17 GM/SCOOP powder, Take 8.5 g by mouth daily., Disp: , Rfl:  predniSONE  (DELTASONE ) 20 MG tablet, Take 1 tablet (20 mg total) by mouth 2 (two) times daily with a meal for 5 days., Disp: 10 tablet, Rfl: 0   rOPINIRole  (REQUIP ) 1 MG tablet, Take 1 tablet (1 mg total) by mouth at bedtime., Disp: 90 tablet, Rfl: 1   umeclidinium-vilanterol (ANORO ELLIPTA ) 62.5-25 MCG/ACT AEPB, Inhale 1 puff into the lungs daily., Disp: 60 each, Rfl: 3   clopidogrel  (PLAVIX ) 75 MG tablet,  Take 1 tablet (75 mg total) by mouth daily. (Patient not taking: Reported on 04/15/2024), Disp: 30 tablet, Rfl: 11  Observations/Objective: Today's Vitals   04/15/24 1310  BP: (!) 156/96  Pulse: 92  Weight: 248 lb (112.5 kg)  Height: 6\' 6"  (1.981 m)  PainSc: 8   PainLoc: Shoulder   Physical Exam Patient is alert and no acute distress noted.   Assessment and Plan: Acute pain of right shoulder Assessment & Plan: Xrays ordered Trial on Prednisone  20 mg twice daily x 5 days I explained to the patient that non-pharmacological interventions include the application of ice or heat, rest, and recommended range of motion exercises along with gentle stretching. For pain management, Tylenol  was advised. The patient was instructed to follow up if symptoms worsen or persist. The patient verbalized understanding of the care plan, and all questions were answered.   Orders: -     DG Shoulder Right; Future  Lumbar back pain -     DG Lumbar Spine 2-3 Views; Future  Lumbar pain Assessment & Plan: Xray ordered Trial on Prednisone   Discussed to focus on maintaining good posture, using lumbar support while sitting, and avoiding prolonged sitting or heavy lifting. Engage in low-impact exercises like walking or swimming to strengthen core muscles and reduce strain on the spine. Apply heat or ice packs as needed for pain relief and consider physical therapy for targeted exercises.    Other orders -     predniSONE ; Take 1 tablet (20 mg total) by mouth 2 (two) times daily with a meal for 5 days.  Dispense: 10 tablet; Refill: 0    Follow Up Instructions: No follow-ups on file.   I discussed the assessment and treatment plan with the patient. The patient was provided an opportunity to ask questions, and all were answered. The patient agreed with the plan and demonstrated an understanding of the instructions.   The patient was advised to call back or seek an in-person evaluation if the symptoms worsen or  if the condition fails to improve as anticipated.  The above assessment and management plan was discussed with the patient. The patient verbalized understanding of and has agreed to the management plan.   Avelino Lek Amber Bail, FNP

## 2024-04-15 NOTE — Telephone Encounter (Signed)
 Copied from CRM [id number]. Topic: Clinical - Red Word Triage >> Apr 15, 2024 11:46 AM [place] B wrote: [facility] that prompted transfer to Nurse Triage: pt has severe pain on the right, shoulder , back and arm   Chief Complaint: Back and shoulder pain Symptoms: Right shoulder pain, mid-back pain  Frequency: Constant  Disposition: [] ED /[] Urgent Care (no appt availability in office) / [x] Appointment(In office/virtual)/ []  [place] [place] [facility]/ [] Home Care/ [] Refused Recommended Disposition /[] [place] [facility]/ []  Follow-up with PCP Additional Notes: Patient's wife called to report that the patient began to experience right shoulder and mid-back pain last night. She states his pain has been constant and has not responded to Tylenol . She states that she would like an appointment to discuss treatment of his pain. Virtual appointment made for the patient today for evaluation.     Reason for Disposition  [1] SEVERE back pain (e.g., excruciating, unable to do any normal activities) AND [2] not improved 2 hours after pain medicine  Answer Assessment - Initial Assessment Questions 1. ONSET: "When did the pain start?"     Last night  2. LOCATION: "Where is the pain located?"     Right shoulder 3. PAIN: "How bad is the pain?" (Scale 1-10; or mild, moderate, severe)   - MILD (1-3): doesn't interfere with normal activities   - MODERATE (4-7): interferes with normal activities (e.g., work or school) or awakens from sleep   - SEVERE (8-10): excruciating pain, unable to do any normal activities, unable to move arm at all due to pain     Moderate to severe  4. WORK OR EXERCISE: "Has there been any recent work or exercise that involved this part of the body?"     No 5. CAUSE: "What do you think is causing the shoulder pain?"     Unsure  6. OTHER SYMPTOMS: "Do you have any other symptoms?" (e.g., neck pain, swelling, rash, fever, numbness, weakness)     Back pain from center to tailbone and to the  right  Answer Assessment - Initial Assessment Questions 1. ONSET: "When did the pain begin?"      Last night  2. LOCATION: "Where does it hurt?" (upper, mid or lower back)     Mid back down to tailbone and to right side 3. SEVERITY: "How bad is the pain?"  (e.g., Scale 1-10; mild, moderate, or severe)   - MILD (1-3): Doesn't interfere with normal activities.    - MODERATE (4-7): Interferes with normal activities or awakens from sleep.    - SEVERE (8-10): Excruciating pain, unable to do any normal activities.      Moderate to severe  4. PATTERN: "Is the pain constant?" (e.g., yes, no; constant, intermittent)      Constant  5. RADIATION: "Does the pain shoot into your legs or somewhere else?"     Down to tailbone and to right side  6. CAUSE:  "What do you think is causing the back pain?"      Unsure 7. BACK OVERUSE:  "Any recent lifting of heavy objects, strenuous work or exercise?"     No 8. MEDICINES: "What have you taken so far for the pain?" (e.g., nothing, acetaminophen , NSAIDS)     Tylenol  without relief  9. NEUROLOGIC SYMPTOMS: "Do you have any weakness, numbness, or problems with bowel/bladder control?"     No 10. OTHER SYMPTOMS: "Do you have any other symptoms?" (e.g., fever, abdomen pain, burning with urination, blood in urine)  Right shoulder pain  Protocols used: Shoulder Pain-A-AH, Back Pain-A-AH

## 2024-04-18 ENCOUNTER — Telehealth (INDEPENDENT_AMBULATORY_CARE_PROVIDER_SITE_OTHER): Payer: Self-pay | Admitting: Gastroenterology

## 2024-04-18 ENCOUNTER — Ambulatory Visit (INDEPENDENT_AMBULATORY_CARE_PROVIDER_SITE_OTHER): Payer: Self-pay | Admitting: Gastroenterology

## 2024-04-18 NOTE — Telephone Encounter (Signed)
 Pt wife left voicemail in regards to needing to know about pt diet for today for his procedure tomorrow. Returned call to pt wife but had to leave voicemail

## 2024-04-19 ENCOUNTER — Other Ambulatory Visit: Payer: Self-pay

## 2024-04-19 ENCOUNTER — Ambulatory Visit (HOSPITAL_COMMUNITY)
Admission: RE | Admit: 2024-04-19 | Discharge: 2024-04-19 | Disposition: A | Discharge status: Home / Self Care | Attending: Gastroenterology | Admitting: Gastroenterology

## 2024-04-19 ENCOUNTER — Ambulatory Visit (HOSPITAL_BASED_OUTPATIENT_CLINIC_OR_DEPARTMENT_OTHER): Admitting: Certified Registered"

## 2024-04-19 ENCOUNTER — Ambulatory Visit (HOSPITAL_COMMUNITY): Admitting: Certified Registered"

## 2024-04-19 ENCOUNTER — Encounter (HOSPITAL_COMMUNITY)
Admission: RE | Disposition: A | Discharge status: Home / Self Care | Payer: Self-pay | Source: Home / Self Care | Attending: Gastroenterology

## 2024-04-19 DIAGNOSIS — Z7902 Long term (current) use of antithrombotics/antiplatelets: Secondary | ICD-10-CM | POA: Insufficient documentation

## 2024-04-19 DIAGNOSIS — K254 Chronic or unspecified gastric ulcer with hemorrhage: Secondary | ICD-10-CM | POA: Diagnosis not present

## 2024-04-19 DIAGNOSIS — F419 Anxiety disorder, unspecified: Secondary | ICD-10-CM | POA: Diagnosis not present

## 2024-04-19 DIAGNOSIS — M1990 Unspecified osteoarthritis, unspecified site: Secondary | ICD-10-CM | POA: Diagnosis not present

## 2024-04-19 DIAGNOSIS — Z905 Acquired absence of kidney: Secondary | ICD-10-CM | POA: Insufficient documentation

## 2024-04-19 DIAGNOSIS — Z5982 Transportation insecurity: Secondary | ICD-10-CM | POA: Diagnosis not present

## 2024-04-19 DIAGNOSIS — K31A29 Gastric intestinal metaplasia with dysplasia, unspecified: Secondary | ICD-10-CM | POA: Diagnosis not present

## 2024-04-19 DIAGNOSIS — F17210 Nicotine dependence, cigarettes, uncomplicated: Secondary | ICD-10-CM | POA: Diagnosis not present

## 2024-04-19 DIAGNOSIS — J449 Chronic obstructive pulmonary disease, unspecified: Secondary | ICD-10-CM | POA: Diagnosis not present

## 2024-04-19 DIAGNOSIS — Z7982 Long term (current) use of aspirin: Secondary | ICD-10-CM | POA: Diagnosis not present

## 2024-04-19 DIAGNOSIS — C165 Malignant neoplasm of lesser curvature of stomach, unspecified: Secondary | ICD-10-CM | POA: Diagnosis not present

## 2024-04-19 DIAGNOSIS — N189 Chronic kidney disease, unspecified: Secondary | ICD-10-CM | POA: Diagnosis not present

## 2024-04-19 DIAGNOSIS — K319 Disease of stomach and duodenum, unspecified: Secondary | ICD-10-CM | POA: Diagnosis not present

## 2024-04-19 DIAGNOSIS — Z8249 Family history of ischemic heart disease and other diseases of the circulatory system: Secondary | ICD-10-CM | POA: Diagnosis not present

## 2024-04-19 DIAGNOSIS — K259 Gastric ulcer, unspecified as acute or chronic, without hemorrhage or perforation: Secondary | ICD-10-CM | POA: Diagnosis not present

## 2024-04-19 DIAGNOSIS — K31A0 Gastric intestinal metaplasia, unspecified: Secondary | ICD-10-CM | POA: Insufficient documentation

## 2024-04-19 DIAGNOSIS — K31819 Angiodysplasia of stomach and duodenum without bleeding: Secondary | ICD-10-CM | POA: Diagnosis not present

## 2024-04-19 DIAGNOSIS — E1122 Type 2 diabetes mellitus with diabetic chronic kidney disease: Secondary | ICD-10-CM | POA: Diagnosis not present

## 2024-04-19 DIAGNOSIS — Z5986 Financial insecurity: Secondary | ICD-10-CM | POA: Insufficient documentation

## 2024-04-19 DIAGNOSIS — I129 Hypertensive chronic kidney disease with stage 1 through stage 4 chronic kidney disease, or unspecified chronic kidney disease: Secondary | ICD-10-CM | POA: Diagnosis not present

## 2024-04-19 DIAGNOSIS — G709 Myoneural disorder, unspecified: Secondary | ICD-10-CM | POA: Diagnosis not present

## 2024-04-19 DIAGNOSIS — I252 Old myocardial infarction: Secondary | ICD-10-CM | POA: Insufficient documentation

## 2024-04-19 DIAGNOSIS — K250 Acute gastric ulcer with hemorrhage: Secondary | ICD-10-CM

## 2024-04-19 DIAGNOSIS — C169 Malignant neoplasm of stomach, unspecified: Secondary | ICD-10-CM | POA: Diagnosis not present

## 2024-04-19 DIAGNOSIS — K648 Other hemorrhoids: Secondary | ICD-10-CM | POA: Insufficient documentation

## 2024-04-19 DIAGNOSIS — K31811 Angiodysplasia of stomach and duodenum with bleeding: Secondary | ICD-10-CM | POA: Diagnosis present

## 2024-04-19 DIAGNOSIS — G4730 Sleep apnea, unspecified: Secondary | ICD-10-CM | POA: Diagnosis not present

## 2024-04-19 DIAGNOSIS — K3189 Other diseases of stomach and duodenum: Secondary | ICD-10-CM | POA: Diagnosis not present

## 2024-04-19 DIAGNOSIS — C49A2 Gastrointestinal stromal tumor of stomach: Secondary | ICD-10-CM | POA: Diagnosis not present

## 2024-04-19 DIAGNOSIS — Z8673 Personal history of transient ischemic attack (TIA), and cerebral infarction without residual deficits: Secondary | ICD-10-CM | POA: Diagnosis not present

## 2024-04-19 DIAGNOSIS — F17200 Nicotine dependence, unspecified, uncomplicated: Secondary | ICD-10-CM | POA: Diagnosis not present

## 2024-04-19 DIAGNOSIS — K2951 Unspecified chronic gastritis with bleeding: Secondary | ICD-10-CM

## 2024-04-19 HISTORY — PX: ENTEROSCOPY: SHX5533

## 2024-04-19 LAB — GLUCOSE, CAPILLARY: Glucose-Capillary: 94 mg/dL (ref 70–99)

## 2024-04-19 SURGERY — ENTEROSCOPY
Anesthesia: General

## 2024-04-19 MED ORDER — LACTATED RINGERS IV SOLN: INTRAVENOUS | Status: DC

## 2024-04-19 MED ORDER — GLYCOPYRROLATE PF 0.2 MG/ML IJ SOSY
PREFILLED_SYRINGE | INTRAMUSCULAR | Status: AC
  Filled 2024-04-19: qty 1

## 2024-04-19 MED ORDER — PANTOPRAZOLE SODIUM 40 MG PO TBEC
40.0000 mg | DELAYED_RELEASE_TABLET | Freq: Two times a day (BID) | ORAL | 2 refills | Status: DC
Start: 2024-04-19 — End: 2024-07-18

## 2024-04-19 MED ORDER — LIDOCAINE 2% (20 MG/ML) 5 ML SYRINGE
INTRAMUSCULAR | Status: AC
  Filled 2024-04-19: qty 5

## 2024-04-19 MED ORDER — PHENYLEPHRINE 80 MCG/ML (10ML) SYRINGE FOR IV PUSH (FOR BLOOD PRESSURE SUPPORT)
PREFILLED_SYRINGE | INTRAVENOUS | Status: AC
  Filled 2024-04-19: qty 10

## 2024-04-19 MED ORDER — LIDOCAINE 2% (20 MG/ML) 5 ML SYRINGE
INTRAMUSCULAR | Status: DC | PRN
  Administered 2024-04-19: 60 mg via INTRAVENOUS

## 2024-04-19 MED ORDER — SPOT INK MARKER SYRINGE KIT
PACK | SUBMUCOSAL | Status: DC | PRN
  Administered 2024-04-19: 1 mL via SUBMUCOSAL

## 2024-04-19 MED ORDER — LACTATED RINGERS IV SOLN
INTRAVENOUS | Status: DC | PRN
Start: 2024-04-19 — End: 2024-04-19

## 2024-04-19 MED ORDER — PROPOFOL 1000 MG/100ML IV EMUL
INTRAVENOUS | Status: AC
  Filled 2024-04-19: qty 100

## 2024-04-19 MED ORDER — SPOT INK MARKER SYRINGE KIT
PACK | SUBMUCOSAL | Status: AC
  Filled 2024-04-19: qty 5

## 2024-04-19 MED ORDER — GLYCOPYRROLATE PF 0.2 MG/ML IJ SOSY
PREFILLED_SYRINGE | INTRAMUSCULAR | Status: DC | PRN
  Administered 2024-04-19: .2 mg via INTRAVENOUS

## 2024-04-19 MED ORDER — EPHEDRINE 5 MG/ML INJ
INTRAVENOUS | Status: AC
  Filled 2024-04-19: qty 5

## 2024-04-19 MED ORDER — PROPOFOL 10 MG/ML IV BOLUS
INTRAVENOUS | Status: DC | PRN
  Administered 2024-04-19: 80 mg via INTRAVENOUS
  Administered 2024-04-19: 150 ug/kg/min via INTRAVENOUS

## 2024-04-19 NOTE — Anesthesia Preprocedure Evaluation (Addendum)
 Anesthesia Evaluation    History of Anesthesia Complications (+) Family history of anesthesia reaction and history of anesthetic complications  Airway Mallampati: II  TM Distance: >3 FB Neck ROM: Full    Dental no notable dental hx. (+) Edentulous Lower, Edentulous Upper   Pulmonary shortness of breath, sleep apnea , pneumonia, COPD, Current Smoker and Patient abstained from smoking.   Pulmonary exam normal breath sounds clear to auscultation       Cardiovascular hypertension, + Past MI  Normal cardiovascular exam Rhythm:Regular Rate:Normal     Neuro/Psych   Anxiety     TIA Neuromuscular disease CVA    GI/Hepatic negative GI ROS, Neg liver ROS,,,  Endo/Other  diabetes    Renal/GU Renal disease     Musculoskeletal  (+) Arthritis ,    Abdominal   Peds  Hematology  (+) Blood dyscrasia, anemia   Anesthesia Other Findings   Reproductive/Obstetrics                             Anesthesia Physical Anesthesia Plan  ASA: 2  Anesthesia Plan: General   Post-op Pain Management:    Induction: Intravenous  PONV Risk Score and Plan:   Airway Management Planned:   Additional Equipment:   Intra-op Plan:   Post-operative Plan:   Informed Consent: I have reviewed the patients History and Physical, chart, labs and discussed the procedure including the risks, benefits and alternatives for the proposed anesthesia with the patient or authorized representative who has indicated his/her understanding and acceptance.     Dental advisory given  Plan Discussed with: CRNA  Anesthesia Plan Comments:        Anesthesia Quick Evaluation

## 2024-04-19 NOTE — H&P (Signed)
 Primary Care Physician:  Meldon Sport, MD Primary Gastroenterologist:  Dr. Alita Irwin  Pre-Procedure History & Physical: HPI:  Raymond Barrera. is a 63 y.o. male  with  history of CVA on ASA/Plavix , hypertension, COPD, diabetes, CKD, RCC status post right nephrectomy who presents for evaluation of Iron  deficiency anemia .    Last EGD:03/2024   - 1 cm hiatal hernia. - Erosive gastropathy with no stigmata of recent bleeding. Biopsied. - Normal examined duodenum. Biopsied.     Last Colonoscopy:03/2024   - Preparation of the colon was fair. - Three 3 to 5 mm polyps in the transverse colon, removed with a cold snare. Resected and retrieved. Clip manufacturer: [employer]. Clip ( MR safe) was placed.   Internal hemorrhoids   3 TA's   - Daily H/ H. - Repeat colonoscopy in 1 year because the bowel preparation was suboptimal. - Avoid Cocaine and NSAID use. - Keep on ASA 81 mg for next 5 days, then restart Plavix  if stable.   Capsule endoscopy;    Study complete, prep was good. Small bowel capsule reached the colon during study.  No active bleeding or old blood noted.  He had several small nonbleeding small bowel angiectasias (28 minutes 53 seconds, 32 minutes 50 seconds, 34 minutes 59 seconds, 36 minutes 1 second, 38 minutes 11 seconds, 40 minutes 18 seconds). Couple of benign lymphangiectasia.     Past Medical History:  Diagnosis Date   Ambulates with cane    straight - uses occasionally   Anxiety    due to the stroke   Arthritis    Back pain    hx of buldging disc   Complication of anesthesia    took a while for him to wake up after previous anesthesia   CVA (cerebral vascular accident) (HCC) 10/07/2020   Diabetes mellitus without complication (HCC)    Family history of adverse reaction to anesthesia    "sometimes mom has a hard time waking up"   High cholesterol    takes Zocor  daily   Hypertension    takes Benazepril  and HCTZ  daily   Joint pain    Joint swelling     Memory impairment    occassional - from stroke   Myocardial infarction (HCC) 1987   Pneumonia    hx of-80's   Shortness of breath dyspnea    do to pain   Sleep apnea    never had a sleep study,but states Dr. Colby Daub says he has it   Slurred speech    Stroke (HCC) 08/2013   7 mini-strokes, last stroke 2017   TIA (transient ischemic attack) 2014   x 7    Urinary frequency     Past Surgical History:  Procedure Laterality Date   ABDOMINAL EXPOSURE N/A 06/16/2018   Procedure: ABDOMINAL EXPOSURE;  Surgeon: Mayo Speck, MD;  Location: [facility] OR;  Service: Vascular;  Laterality: N/A;   ANKLE SURGERY  2008   left ankle-otif-[facility]   ANTERIOR LUMBAR FUSION Bilateral 06/16/2018   Procedure: LUMBAR 4-5 LUMBAR 5-SACRUM 1 ANTERIOR LUMBAR INTERBODY FUSION WITH INSTRUMENTATION AND ALLOGRAFT;  Surgeon: Virl Grimes, MD;  Location: [facility] OR;  Service: Orthopedics;  Laterality: Bilateral;   BACK SURGERY     COLONOSCOPY N/A 03/01/2024   Procedure: COLONOSCOPY;  Surgeon: Urban Garden, MD;  Location: [facility];  Service: Gastroenterology;  Laterality: N/A;   ESOPHAGOGASTRODUODENOSCOPY N/A 03/01/2024   Procedure: EGD (ESOPHAGOGASTRODUODENOSCOPY);  Surgeon: Umberto Ganong, Bearl Limes, MD;  Location: [facility];  Service: Gastroenterology;  Laterality: N/A;   GIVENS CAPSULE STUDY N/A 03/02/2024   Procedure: IMAGING PROCEDURE, GI TRACT, INTRALUMINAL, VIA CAPSULE;  Surgeon: Vinetta Greening, DO;  Location: [facility];  Service: Endoscopy;  Laterality: N/A;   HEMATOMA EVACUATION Left 08/13/2018   Procedure: EVACUATION HEMATOMA LEFT ABDOMINAL WALL;  Surgeon: Mayo Speck, MD;  Location: [facility] OR;  Service: Vascular;  Laterality: Left;   HEMOSTASIS CLIP PLACEMENT  03/01/2024   Procedure: CONTROL OF HEMORRHAGE, GI TRACT, ENDOSCOPIC, BY CLIPPING OR OVERSEWING;  Surgeon: Urban Garden, MD;  Location: [facility];  Service: Gastroenterology;;   IR RADIOLOGIST EVAL & MGMT  07/27/2018   IR US   GUIDE BX ASP/DRAIN  07/14/2018   JOINT REPLACEMENT     both hips replaced    LUMBAR LAMINECTOMY/DECOMPRESSION MICRODISCECTOMY Right 10/17/2014   Procedure: LUMBAR LAMINECTOMY/DECOMPRESSION MICRODISCECTOMY 2 LEVELS;  Surgeon: Augusto Blonder, MD;  Location: [facility];  Service: Neurosurgery;  Laterality: Right;  Right L45 L5S1 laminectomy and foraminotomy   MASS EXCISION  09/13/2012   Procedure: EXCISION MASS;  Surgeon: Beau Bound, MD;  Location: [facility] ORS;  Service: General;  Laterality: N/A;   POLYPECTOMY  03/01/2024   Procedure: POLYPECTOMY;  Surgeon: Urban Garden, MD;  Location: [facility];  Service: Gastroenterology;;   ROBOTIC ASSITED PARTIAL NEPHRECTOMY Left 03/30/2019   Procedure: XI ROBOTIC ASSITED PARTIAL NEPHRECTOMY POSSIBLE RADICAL NEPHRECTOMY;  Surgeon: Adelbert Homans, MD;  Location: [facility] ORS;  Service: Urology;  Laterality: Left;   SHOULDER ARTHROSCOPY WITH ROTATOR CUFF REPAIR Right 04/12/2020   Procedure: right shoulder arthroscopy, debridement, mini open rotator cuff tear repair;  Surgeon: Jasmine Mesi, MD;  Location: [facility] OR;  Service: Orthopedics;  Laterality: Right;   TONSILLECTOMY     TOTAL HIP ARTHROPLASTY Right 03/20/2015   TOTAL HIP ARTHROPLASTY Right 03/20/2015   Procedure: TOTAL HIP ARTHROPLASTY ANTERIOR APPROACH;  Surgeon: Saundra Curl, MD;  Location: [facility] OR;  Service: Orthopedics;  Laterality: Right;   TOTAL HIP ARTHROPLASTY Left 01/08/2016   Procedure: TOTAL HIP ARTHROPLASTY ANTERIOR APPROACH;  Surgeon: Saundra Curl, MD;  Location: [facility] OR;  Service: Orthopedics;  Laterality: Left;    Prior to Admission medications   Medication Sig Start Date End Date Taking? Authorizing Provider  albuterol  (VENTOLIN  HFA) 108 (90 Base) MCG/ACT inhaler Inhale 2 puffs into the lungs every 6 (six) hours as needed for wheezing or shortness of breath (cough). 03/03/24  Yes Emokpae, Courage, MD  amLODipine  (NORVASC ) 2.5 MG tablet Take 1 tablet (2.5 mg total)  by mouth daily. 03/03/24 06/01/24 Yes Colin Dawley, MD  aspirin  EC 81 MG tablet Take 1 tablet (81 mg total) by mouth daily with breakfast. Swallow whole. 03/03/24  Yes Emokpae, Courage, MD  atorvastatin  (LIPITOR) 40 MG tablet Take 1 tablet (40 mg total) by mouth daily. 03/03/24  Yes Emokpae, Courage, MD  gabapentin  (NEURONTIN ) 300 MG capsule Take 1 capsule (300 mg total) by mouth at bedtime. 02/25/24  Yes Meldon Sport, MD  JARDIANCE  10 MG TABS tablet Take 1 tablet (10 mg total) by mouth daily. 01/01/24  Yes Meldon Sport, MD  pantoprazole  (PROTONIX ) 40 MG tablet Take 1 tablet (40 mg total) by mouth 2 (two) times daily. 03/03/24  Yes Emokpae, Courage, MD  rOPINIRole  (REQUIP ) 1 MG tablet Take 1 tablet (1 mg total) by mouth at bedtime. 03/24/24  Yes Meldon Sport, MD  Accu-Chek Softclix Lancets lancets SMARTSIG:Topical 2-3 Times Daily 05/15/23   [provider]  acetaminophen  (TYLENOL ) 325 MG tablet Take 2 tablets (650 mg total) by mouth every 6 (six) hours as needed for mild pain (pain score 1-3) (or Fever >/= 101). 03/03/24   Colin Dawley, MD  Camphor-Menthol -Methyl Sal (SALONPAS EX) Apply topically. As needed    [provider]  clopidogrel  (PLAVIX ) 75 MG tablet Take 1 tablet (75 mg total) by mouth daily. Patient not taking: Reported on 04/15/2024 03/09/24   Colin Dawley, MD  DULoxetine  (CYMBALTA ) 30 MG capsule Take 30 mg by mouth daily.    [provider]  ferrous sulfate  325 (65 FE) MG EC tablet Take 1 tablet (325 mg total) by mouth every Tuesday, Thursday, Saturday, and Sunday. 03/03/24   Colin Dawley, MD  polyethylene glycol powder (GLYCOLAX /MIRALAX ) 17 GM/SCOOP powder Take 8.5 g by mouth daily. 03/15/24   Moise Friday, Forbes Ida, MD  predniSONE  (DELTASONE ) 20 MG tablet Take 1 tablet (20 mg total) by mouth 2 (two) times daily with a meal for 5 days. 04/15/24 04/20/24  Del Orbe Polanco, Iliana, FNP  umeclidinium-vilanterol (ANORO ELLIPTA ) 62.5-25 MCG/ACT AEPB Inhale 1 puff into  the lungs daily. 03/03/24   Colin Dawley, MD    Allergies as of 03/17/2024 - Review Complete 03/15/2024  Allergen Reaction Noted   Poison ivy extract [poison ivy extract] Itching and Rash 05/26/2014   Coreg  [carvedilol ] Itching 03/26/2023    Family History  Problem Relation Age of Onset   Heart disease Father    Colon cancer Neg Hx    Colon polyps Neg Hx     Social History   Socioeconomic History   Marital status: Married    Spouse name: [name] C.  Walck   Number of children: Not on file   Years of education: Not on file   Highest education level: 12th grade  Occupational History   Not on file  Tobacco Use   Smoking status: Some Days    Current packs/day: 0.25    Average packs/day: 0.3 packs/day for 47.0 years (11.8 ttl pk-yrs)    Types: Cigarettes    Passive exposure: Current   Smokeless tobacco: Never   Tobacco comments:    Currently smokes about 2 cigarettes per day.  07/14/2023 hfb  Vaping Use   Vaping status: Never Used  Substance and Sexual Activity   Alcohol use: Not Currently    Comment: quit 2012   Drug use: Not Currently    Types: Cocaine    Comment: many yrs ago., last time- late 2016   Sexual activity: Yes  Other Topics Concern   Not on file  Social History Narrative   Are you right handed or left handed? Right   Are you currently employed ? disabled   What is your current occupation?   Do you live at home alone? NO   Who lives with you? Mother, Wife   What type of home do you live in: 1 story or 2 story? 1       Social Drivers of Corporate investment banker Strain: Medium Risk (12/28/2023)   Overall Financial Resource Strain (CARDIA)    Difficulty of Paying Living Expenses: Somewhat hard  Food Insecurity: Patient Declined (03/01/2024)   Hunger Vital Sign    Worried About Running Out of Food in the Last Year: Patient declined    Ran Out of Food in the Last Year: Patient declined  Recent Concern: Food Insecurity - Food Insecurity Present  (12/28/2023)   Hunger Vital Sign    Worried About Programme researcher, broadcasting/film/video  in the Last Year: Sometimes true    Ran Out of Food in the Last Year: Sometimes true  Transportation Needs: No Transportation Needs (03/01/2024)   PRAPARE - Administrator, Civil Service (Medical): No    Lack of Transportation (Non-Medical): No  Recent Concern: Transportation Needs - Unmet Transportation Needs (12/28/2023)   PRAPARE - Transportation    Lack of Transportation (Medical): Yes    Lack of Transportation (Non-Medical): Yes  Physical Activity: Insufficiently Active (12/28/2023)   Exercise Vital Sign    Days of Exercise per Week: 1 day    Minutes of Exercise per Session: 10 min  Stress: Stress Concern Present (12/28/2023)   [employer] of Occupational Health - Occupational Stress Questionnaire    Feeling of Stress : To some extent  Social Connections: Unknown (03/01/2024)   Social Connection and Isolation Panel [NHANES]    Frequency of Communication with Friends and Family: Patient declined    Frequency of Social Gatherings with Friends and Family: Patient declined    Attends Religious Services: More than 4 times per year    Active Member of Clubs or Organizations: Yes    Attends Banker Meetings: Patient declined    Marital Status: Married  Catering manager Violence: Unknown (03/01/2024)   Humiliation, Afraid, Rape, and Kick questionnaire    Fear of Current or Ex-Partner: No    Emotionally Abused: No    Physically Abused: Not on file    Sexually Abused: No    Review of Systems: See HPI, otherwise negative ROS  Physical Exam: Vital signs in last 24 hours: Temp:  [98.2 F (36.8 C)] 98.2 F (36.8 C) (05/20 0759) Pulse Rate:  [65] 65 (05/20 0759) Resp:  [18] 18 (05/20 0759) BP: (165)/(102) 165/102 (05/20 0759) SpO2:  [100 %] 100 % (05/20 0759) Weight:  [112.5 kg] 112.5 kg (05/20 0759)   General:   Alert,  Well-developed, well-nourished, pleasant and cooperative in  NAD Head:  Normocephalic and atraumatic. Eyes:  Sclera clear, no icterus.   Conjunctiva pink. Ears:  Normal auditory acuity. Nose:  No deformity, discharge,  or lesions. Msk:  Symmetrical without gross deformities. Normal posture. Extremities:  Without clubbing or edema. Neurologic:  Alert and  oriented x4;  grossly normal neurologically. Skin:  Intact without significant lesions or rashes. Psych:  Alert and cooperative. Normal mood and affect.  Impression/Plan: Proceed with push enteroscopy   The risks of the procedure including infection, bleed, or perforation as well as benefits, limitations, alternatives and imponderables have been reviewed with the patient. Questions have been answered. All parties agreeable.

## 2024-04-19 NOTE — Op Note (Addendum)
 [facility] Patient Name: Raymond Barrera Procedure Date: 04/19/2024 8:34 AM MRN: [id number] Date of Birth: 19-Apr-1961 Attending MD: Terril Fetters , MD, [id number] CSN: [id number] Age: 63 Admit Type: Outpatient Procedure:                Small bowel enteroscopy Indications:              Obscure gastrointestinal bleeding, Angioectasia of                            duodenum Providers:                Terril Fetters, MD, Karyle Pagoda, RN, Sharlette Dayhoff                            Technician, Technician Referring MD:              Medicines:                Monitored Anesthesia Care Complications:            No immediate complications. Estimated Blood Loss:     Estimated blood loss was minimal. Procedure:                Pre-Anesthesia Assessment:                           - Prior to the procedure, a History and Physical                            was performed, and patient medications and                            allergies were reviewed. The patient's tolerance of                            previous anesthesia was also reviewed. The risks                            and benefits of the procedure and the sedation                            options and risks were discussed with the patient.                            All questions were answered, and informed consent                            was obtained. Prior Anticoagulants: The patient has                            taken Plavix  (clopidogrel ), last dose was 5 days                            prior to procedure. ASA Grade Assessment: III - A  patient with severe systemic disease. After                            reviewing the risks and benefits, the patient was                            deemed in satisfactory condition to undergo the                            procedure.                           After obtaining informed consent, the endoscope was                            passed under direct vision. Throughout  the                            procedure, the patient's blood pressure, pulse, and                            oxygen  saturations were monitored continuously. The                            [phone]) scope was introduced through                            the mouth and advanced to the mid-jejunum. The                            small bowel enteroscopy was accomplished without                            difficulty. The patient tolerated the procedure                            well. Scope In: 8:58:36 AM Scope Out: 9:29:33 AM Total Procedure Duration: 0 hours 30 minutes 57 seconds  Findings:      The examined esophagus was normal.      One non-bleeding cratered gastric ulcer with a clean ulcer base (Forrest       Class III) was found at the incisura. The lesion was 10 mm in largest       dimension. Biopsies were taken with a cold forceps for histology. For       hemostasis, one hemostatic clip was successfully placed (MR       conditional). Clip manufacturer: [employer]. There was no       bleeding at the end of the procedure.      There was no evidence of significant pathology in the entire examined       duodenum.      There was no evidence of significant pathology in the proximal jejunum.      There was no evidence of significant pathology in the mid-jejunum. Area       was tattooed with an injection of 1 mL of Spot (carbon black). Impression:               -  Normal esophagus.                           - Non-bleeding gastric ulcer with a clean ulcer                            base (Forrest Class III). Biopsied. Clip (MR                            conditional) was placed. Clip manufacturer: [employer].                           - Normal examined duodenum.                           - The examined portion of the jejunum was normal.                           - The examined portion of the jejunum was normal.                             Tattooed.                           -No angioectasia seen on capsule endoscopy seen                            during the push enteroscopy despite multiple looks Moderate Sedation:      Per Anesthesia Care Recommendation:           - Patient has a contact number available for                            emergencies. The signs and symptoms of potential                            delayed complications were discussed with the                            patient. Return to normal activities tomorrow.                            Written discharge instructions were provided to the                            patient.                           - Resume previous diet.                           - Repeat the small bowel enteroscopy in 8 weeks to  check healing and to evaluate the response to                            therapy.                           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 Procedure Code(s):        --- Professional ---                           [phone], 59, Small intestinal endoscopy, enteroscopy                            beyond second portion of duodenum, not including                            ileum; with control of bleeding (eg, injection,                            bipolar cautery, unipolar cautery, laser, heater                            probe, stapler, plasma coagulator)                           44361, Small intestinal endoscopy, enteroscopy                            beyond second portion of duodenum, not including                            ileum; with biopsy, single or multiple                           44799, Unlisted procedure, small intestine Diagnosis Code(s):        --- Professional ---                            K25.9, Gastric ulcer, unspecified as acute or                            chronic, without hemorrhage or perforation  K92.2, Gastrointestinal hemorrhage, unspecified                           K31.819, Angiodysplasia of stomach and duodenum                            without bleeding CPT copyright 2022 American Medical Association. All rights reserved. The codes documented in this report are preliminary and upon coder review may  be revised to meet current compliance requirements. Terril Fetters, MD Terril Fetters, MD 04/19/2024 9:40:35 AM This report has been signed electronically. Number of Addenda: 0

## 2024-04-19 NOTE — Transfer of Care (Addendum)
 Immediate Anesthesia Transfer of Care Note  Patient: Gregroy Dombkowski.  Procedure(s) Performed: ENTEROSCOPY  Patient Location: Short Stay  Anesthesia Type:General  Level of Consciousness: drowsy and patient cooperative  Airway & Oxygen  Therapy: Patient Spontanous Breathing and Patient connected to nasal cannula oxygen   Post-op Assessment: Report given to RN and Post -op Vital signs reviewed and stable  Post vital signs: Reviewed and stable  Last Vitals:  Vitals Value Taken Time  BP 148/81 04/19/24   0935  Temp 36.4 04/19/24   0935  Pulse 87 04/19/24   0935  Resp 19 04/19/24   0935  SpO2 98% 04/19/24   0935    Last Pain:  Vitals:   04/19/24 0851  TempSrc:   PainSc: 0-No pain         Complications: No notable events documented.

## 2024-04-19 NOTE — Anesthesia Postprocedure Evaluation (Signed)
 Anesthesia Post Note  Patient: Raymond Barrera.  Procedure(s) Performed: ENTEROSCOPY  Patient location during evaluation: PACU Anesthesia Type: General Level of consciousness: awake and alert Pain management: pain level controlled Vital Signs Assessment: post-procedure vital signs reviewed and stable Respiratory status: spontaneous breathing, nonlabored ventilation, respiratory function stable and patient connected to nasal cannula oxygen  Cardiovascular status: stable and blood pressure returned to baseline Postop Assessment: no apparent nausea or vomiting Anesthetic complications: no  No notable events documented.   Last Vitals:  Vitals:   04/19/24 0759 04/19/24 0935  BP: (!) 165/102 (!) 149/81  Pulse: 65 87  Resp: 18 19  Temp: 36.8 C 36.4 C  SpO2: 100% 98%    Last Pain:  Vitals:   04/19/24 0935  TempSrc: Axillary  PainSc: 0-No pain                 Beacher Limerick

## 2024-04-19 NOTE — Discharge Instructions (Signed)
  Discharge instructions Please read the instructions outlined below and refer to this sheet in the next few weeks. These discharge instructions provide you with general information on caring for yourself after you leave the hospital. Your doctor may also give you specific instructions. While your treatment has been planned according to the most current medical practices available, unavoidable complications occasionally occur. If you have any problems or questions after discharge, please call your doctor. ACTIVITY You may resume your regular activity but move at a slower pace for the next 24 hours.  Take frequent rest periods for the next 24 hours.  Walking will help expel (get rid of) the air and reduce the bloated feeling in your abdomen.  No driving for 24 hours (because of the anesthesia (medicine) used during the test).  You may shower.  Do not sign any important legal documents or operate any machinery for 24 hours (because of the anesthesia used during the test).  NUTRITION Drink plenty of fluids.  You may resume your normal diet.  Begin with a light meal and progress to your normal diet.  Avoid alcoholic beverages for 24 hours or as instructed by your caregiver.  MEDICATIONS You may resume your normal medications unless your caregiver tells you otherwise.  WHAT YOU CAN EXPECT TODAY You may experience abdominal discomfort such as a feeling of fullness or "gas" pains.  FOLLOW-UP Your doctor will discuss the results of your test with you.  SEEK IMMEDIATE MEDICAL ATTENTION IF ANY OF THE FOLLOWING OCCUR: Excessive nausea (feeling sick to your stomach) and/or vomiting.  Severe abdominal pain and distention (swelling).  Trouble swallowing.  Temperature over 101 F (37.8 C).  Rectal bleeding or vomiting of blood.    Protonix  40mg  twice daily Restart plavix  after 24 hours   I hope you have a great rest of your week!   Alexey Rhoads Faizan Santino Kinsella , M.D.. Gastroenterology and  Hepatology [facility]

## 2024-04-20 ENCOUNTER — Encounter (HOSPITAL_COMMUNITY): Payer: Self-pay | Admitting: Gastroenterology

## 2024-04-20 ENCOUNTER — Ambulatory Visit (INDEPENDENT_AMBULATORY_CARE_PROVIDER_SITE_OTHER): Payer: Self-pay | Admitting: Gastroenterology

## 2024-04-20 ENCOUNTER — Other Ambulatory Visit: Payer: Self-pay | Admitting: Internal Medicine

## 2024-04-20 DIAGNOSIS — N1832 Chronic kidney disease, stage 3b: Secondary | ICD-10-CM

## 2024-04-20 LAB — SURGICAL PATHOLOGY

## 2024-04-20 NOTE — Progress Notes (Signed)
 Cardiology Office Note    Date:  04/22/2024  ID:  [name]., DOB Feb 11, 1961, MRN [id number] PCP:  Meldon Sport, MD  Cardiologist:  Luana Rumple, MD  Electrophysiologist:  None   Chief Complaint: Follow up for HFrEF   History of Present Illness: .    Raymond Barrera. is a 63 y.o. male with visit-pertinent history of hypertension, type 2 diabetes mellitus, hyperlipidemia, ischemic stroke s/p complex lumbar spine surgery, moderate CKD following nephrectomy for renal cell carcinoma in 2020.  In 2018 patient had left internal capsule ischemic stroke, patient has some mild right hand paresis as well as some difficulty with speech and poor short-term memory.  Prior to that in 2014 he had multiple mini strokes in setting of severe hypertension.  Echocardiogram in July 2019 indicated LVEF of 2025% with severe diffuse hypokinesis and grade 1 DD.  Lexiscan  Myoview  in July 2019 with a EF 24%, no ST segment deviation, no T wave inversion, high risk study.  In 01/2023 patient had TIA in setting of cocaine induced hypertensive urgency.  He presented with weakness to the right hand and worsening speech.  MRI/MRI of head did not show evidence of acute stroke or LVO but several abnormalities of the intracranial circulation were identified.  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  During hospitalization he had an echocardiogram that showed an EF of 30 to 35%, global hypokinesis, mild LVH, no significant valve abnormality seen.  Ascending aorta was mildly dilated at 41 mm.  Patient was seen in clinic by Dr. Alvis Ba in March 2024, patient was doing well that time with NYHA functional class I.  He is on Jardiance , ramipril  as GDMT.  Not on aldosterone antagonist or Entresto  due to renal  dysfunction.  It was thought that his varying degrees of blood pressure and use of cocaine was likely the reason for wide variation in his ejection fraction over the prior year.  Event monitor was ordered given recent TIA with history of CVA and his heart monitor was completed on 02/17/2023 showing presence of occasional monomorphic PVCs representing 2% of all recorded beats.  Symptom triggered recordings generally correlate with periods of frequent PVCs.  There was no atrial fibrillation noted.  PVCs were more active during the daytime and generally diminished in frequency at night.  He was started on carvedilol  3.125 twice daily.  In 03/2023 patient notified the office that he was having pruritus, felt to be related to possibly carvedilol .  Patient was last seen in clinic on 01/26/2024.  Patient is wife noted that in recent weeks he had been becoming increasingly short of breath as well as increasingly dizzy, specifically with position changes.  Patient's wife also noted that he had been having increased balance problems as well as mood changes and memory problems.  On chart review patient's last CBC had indicated hemoglobin of 7, is recommended that he follow-up in the emergency room for possible need for blood transfusion, patient's wife noted that they did not follow-up and that patient was previously on iron  supplement that he had not been taking.  Patient also noted some occasional episodes of palpitations however were unable to give full details.  Patient's lab work indicated a critically low hemoglobin, patient was sent to the emergency department for blood transfusion, patient was referred to GI.  Patient's  cardiac monitor showed dominant rhythm was normal sinus rhythm with normal circadian variation.  There were rare premature supraventricular contractions with no evidence of atrial fibrillation or other sustained supraventricular arrhythmia.  There were occasional PVCs representing approximately 4% of all  recorded beats, rarely occurring as couplets or triplets, sometimes occurring in bigeminy or trigeminy.  There were no patient triggered events.  On chart review on 02/29/2024 patient presented the ED for increased dizziness, generalized weakness and fall.  Found to have a hemoglobin of 6.9.  Gastroenterology was consulted and patient underwent colonoscopy and anoscopy.  Endoscopy indicated 1 cm hiatal hernia, erosive gastropathy with no stigmata of recent bleeding, biopsied, colonoscopy indicated three 3 to 5 mm polyps in the transverse colon, removed with cold snare, resected, nonbleeding internal hemorrhoids.  Patient was discharged with referrals to outpatient gastroenterology and hematology/oncology for IV transfusions.  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  On 04/19/2024 patient underwent push enteroscopy, per notes patient with gastric ulcers status post biopsies with invasive moderately differentiated adenocarcinoma.  Patient has been referred to hematology oncology team.  Today he presents for follow-up.  He reports that he has been doing well overall. He denies chest pain, shortness of breath, lower extremity edema, orthopnea or pnd.  He notes occasional dizziness that is improving with increasing hemoglobin, his wife is concerned with ongoing weight loss.  They are eager to follow-up with hematology and oncology in regards to next step for his recently diagnosed adenocarcinoma.  Patient denies any recent cocaine use, notes that it has been many months since use.  He is receiving IV infusions through the cancer center, notes he has been feeling better with improving hemoglobin.  Patient is able to achieve greater than 4 METS of activity.  Labwork independently reviewed: 04/15/24: sodium 136, potassium 4.3, creatinine 1.66, eGFR 45, hemoglobin 10, hematocrit 34 ROS: .   Today he denies chest pain, shortness of breath, lower extremity edema,  fatigue, palpitations, melena, hematuria, hemoptysis, diaphoresis, weakness, presyncope, syncope, orthopnea, and PND.  All other systems are reviewed and otherwise negative. Studies Reviewed: Aaron Aas   EKG:  EKG is ordered today, personally reviewed, demonstrating  EKG Interpretation Date/Time:  Friday Apr 22 2024 11:31:18 EDT Ventricular Rate:  73 PR Interval:  192 QRS Duration:  110 QT Interval:  402 QTC Calculation: 442 R Axis:   18  Text Interpretation: Sinus rhythm with occasional Premature ventricular complexes Moderate voltage criteria for LVH, may be normal variant ( R in aVL , Sokolow-Lyon ) T wave inversion in inferior leads Confirmed by Jayona Mccaig [id number]) on 04/22/2024 6:46:36 PM   CV Studies: Cardiac studies reviewed are outlined and summarized above. Otherwise please see EMR for full report. Cardiac Studies & Procedures   ______________________________________________________________________________________________   STRESS TESTS  MYOCARDIAL PERFUSION IMAGING 06/15/2018  Narrative  Nuclear stress EF: 24%.  There was no ST segment deviation noted during stress.  No T wave inversion was noted during stress.  This is a high risk study.  The left ventricular ejection fraction is severely decreased (<30%).  High risk nuclear study due to severe dilated cardiomyopathy, LVEF<30%. There is no reversible ischemia.   ECHOCARDIOGRAM  ECHOCARDIOGRAM COMPLETE 01/13/2023  Narrative ECHOCARDIOGRAM REPORT    Patient Name:   Johathan Province. Date of Exam: 01/13/2023 Medical Rec #:  [id number]          Height:       78.0 in Accession #:    [id number]  Weight:       251.8 lb Date of Birth:  1961-10-27          BSA:          2.490 m Patient Age:    61 years           BP:           162/92 mmHg Patient Gender: M                  HR:           66 bpm. Exam Location:  Inpatient  Procedure: 2D Echo, Cardiac Doppler and Color Doppler  Indications:    TIA  History:         Patient has prior history of Echocardiogram examinations, most recent 12/09/2019. Risk Factors:Diabetes, Dyslipidemia, Current Smoker and Hypertension. Hx TIA, stroke.  Sonographer:    Crissie Dome RDCS (AE) Referring Phys: [id number] Audrene Lease  IMPRESSIONS   1. Left ventricular ejection fraction, by estimation, is 30 to 35%. The left ventricle has moderately decreased function. The left ventricle demonstrates global hypokinesis. There is mild concentric left ventricular hypertrophy. Left ventricular diastolic parameters are consistent with Grade I diastolic dysfunction (impaired relaxation). 2. Right ventricular systolic function is normal. The right ventricular size is normal. Tricuspid regurgitation signal is inadequate for assessing PA pressure. 3. The mitral valve is normal in structure. Trivial mitral valve regurgitation. 4. The aortic valve is tricuspid. There is mild calcification of the aortic valve. There is mild thickening of the aortic valve. Aortic valve regurgitation is not visualized. Aortic valve sclerosis/calcification is present, without any evidence of aortic stenosis. 5. Aortic dilatation noted. There is borderline dilatation of the aortic root, measuring 38 mm. There is mild dilatation of the ascending aorta, measuring 41 mm. 6. The inferior vena cava is normal in size with greater than 50% respiratory variability, suggesting right atrial pressure of 3 mmHg.  Comparison(s): Compared to prior TTE, the EF has decreased from 40-45% to ~35%. Otherwise, there is no significant change.  Conclusion(s)/Recommendation(s): No intracardiac source of embolism detected on this transthoracic study. Consider a transesophageal echocardiogram to exclude cardiac source of embolism if clinically indicated.  FINDINGS Left Ventricle: Left ventricular ejection fraction, by estimation, is 30 to 35%. The left ventricle has moderately decreased function. The left ventricle demonstrates global  hypokinesis. The left ventricular internal cavity size was normal in size. There is mild concentric left ventricular hypertrophy. Left ventricular diastolic parameters are consistent with Grade I diastolic dysfunction (impaired relaxation).  Right Ventricle: The right ventricular size is normal. No increase in right ventricular wall thickness. Right ventricular systolic function is normal. Tricuspid regurgitation signal is inadequate for assessing PA pressure.  Left Atrium: Left atrial size was normal in size.  Right Atrium: Right atrial size was normal in size.  Pericardium: There is no evidence of pericardial effusion.  Mitral Valve: The mitral valve is normal in structure. Trivial mitral valve regurgitation.  Tricuspid Valve: The tricuspid valve is normal in structure. Tricuspid valve regurgitation is trivial.  Aortic Valve: The aortic valve is tricuspid. There is mild calcification of the aortic valve. There is mild thickening of the aortic valve. Aortic valve regurgitation is not visualized. Aortic valve sclerosis/calcification is present, without any evidence of aortic stenosis. Aortic valve mean gradient measures 4.0 mmHg. Aortic valve peak gradient measures 7.3 mmHg. Aortic valve area, by VTI measures 2.31 cm.  Pulmonic Valve: The pulmonic valve was not well visualized.  Aorta: Aortic dilatation  noted. There is borderline dilatation of the aortic root, measuring 38 mm. There is mild dilatation of the ascending aorta, measuring 41 mm.  Venous: The inferior vena cava is normal in size with greater than 50% respiratory variability, suggesting right atrial pressure of 3 mmHg.  IAS/Shunts: The atrial septum is grossly normal.   LEFT VENTRICLE PLAX 2D LVIDd:         4.70 cm      Diastology LVIDs:         4.20 cm      LV e' medial:    4.57 cm/s LV PW:         1.10 cm      LV E/e' medial:  9.5 LV IVS:        1.10 cm      LV e' lateral:   5.55 cm/s LVOT diam:     2.30 cm      LV E/e'  lateral: 7.8 LV SV:         65 LV SV Index:   26 LVOT Area:     4.15 cm  LV Volumes (MOD) LV vol d, MOD A2C: 220.0 ml LV vol d, MOD A4C: 281.0 ml LV vol s, MOD A2C: 141.0 ml LV vol s, MOD A4C: 188.0 ml LV SV MOD A2C:     79.0 ml LV SV MOD A4C:     281.0 ml LV SV MOD BP:      88.3 ml  RIGHT VENTRICLE             IVC RV Basal diam:  3.50 cm     IVC diam: 2.20 cm RV Mid diam:    3.60 cm RV S prime:     11.10 cm/s TAPSE (M-mode): 1.9 cm  LEFT ATRIUM             Index        RIGHT ATRIUM           Index LA diam:        2.90 cm 1.16 cm/m   RA Area:     24.50 cm LA Vol (A2C):   72.7 ml 29.20 ml/m  RA Volume:   85.10 ml  34.18 ml/m LA Vol (A4C):   66.8 ml 26.83 ml/m LA Biplane Vol: 70.6 ml 28.36 ml/m AORTIC VALVE AV Area (Vmax):    2.63 cm AV Area (Vmean):   2.21 cm AV Area (VTI):     2.31 cm AV Vmax:           135.00 cm/s AV Vmean:          99.200 cm/s AV VTI:            0.282 m AV Peak Grad:      7.3 mmHg AV Mean Grad:      4.0 mmHg LVOT Vmax:         85.50 cm/s LVOT Vmean:        52.800 cm/s LVOT VTI:          0.157 m LVOT/AV VTI ratio: 0.56  AORTA Ao Root diam: 3.80 cm Ao Asc diam:  4.10 cm  MITRAL VALVE MV Area (PHT): 2.32 cm    SHUNTS MV Decel Time: 327 msec    Systemic VTI:  0.16 m MV E velocity: 43.30 cm/s  Systemic Diam: 2.30 cm MV A velocity: 71.60 cm/s MV E/A ratio:  0.60  Riccardo Chamberlain MD Electronically signed by Riccardo Chamberlain MD Signature Date/Time: 01/13/2023/2:34:05 PM  Final    MONITORS  LONG TERM MONITOR (3-14 DAYS) 03/04/2024  Narrative   The dominant rhythm was normal sinus rhythm with normal circadian variation.   There are rare premature supraventricular contractions and there is no evidence of atrial fibrillation or other sustained supraventricular arrhythmia.   There are occasional premature ventricular contractions representing approximately 4% of all recorded beats, rarely occurring as couplets or triplets, sometimes  occurring in bigeminy or trigeminy.  There is no evidence of sustained ventricular tachycardia.   There is no evidence of severe bradycardia, sinus pauses or high-grade AV block.   There are no patient triggered events.  Mildly abnormal arrhythmia monitor due to the occurrence of asymptomatic occasional premature ventricular contractions representing 3.9% of all recorded beats.  There are no events consisting of more than 3 consecutive ventricular beats. No symptoms were recorded.  Patch Wear Time:  13 days and 23 hours (2025-03-09T20:26:58-0400 to 2025-03-23T20:26:50-0400)  Patient had a min HR of 63 bpm, max HR of 157 bpm, and avg HR of 93 bpm. Predominant underlying rhythm was Sinus Rhythm. Isolated SVEs were rare (<1.0%), SVE Couplets were rare (<1.0%), and no SVE Triplets were present. Isolated VEs were occasional (3.9%, 73376), VE Couplets were rare (<1.0%, 1319), and VE Triplets were rare (<1.0%, 71). Ventricular Bigeminy and Trigeminy were present.       ______________________________________________________________________________________________       Current Reported Medications:.    Current Meds  Medication Sig   Accu-Chek Softclix Lancets lancets SMARTSIG:Topical 2-3 Times Daily   acetaminophen  (TYLENOL ) 325 MG tablet Take 2 tablets (650 mg total) by mouth every 6 (six) hours as needed for mild pain (pain score 1-3) (or Fever >/= 101).   amLODipine  (NORVASC ) 2.5 MG tablet Take 1 tablet (2.5 mg total) by mouth daily.   aspirin  EC 81 MG tablet Take 1 tablet (81 mg total) by mouth daily with breakfast. Swallow whole.   atorvastatin  (LIPITOR) 40 MG tablet Take 1 tablet (40 mg total) by mouth daily.   Camphor-Menthol -Methyl Sal (SALONPAS EX) Apply topically. As needed   clopidogrel  (PLAVIX ) 75 MG tablet Take 1 tablet (75 mg total) by mouth daily.   DULoxetine  (CYMBALTA ) 30 MG capsule Take 30 mg by mouth daily.   ferrous sulfate  325 (65 FE) MG EC tablet Take 1 tablet (325 mg  total) by mouth every Tuesday, Thursday, Saturday, and Sunday.   gabapentin  (NEURONTIN ) 300 MG capsule Take 1 capsule (300 mg total) by mouth at bedtime.   JARDIANCE  10 MG TABS tablet Take 1 tablet (10 mg total) by mouth daily.   pantoprazole  (PROTONIX ) 40 MG tablet Take 1 tablet (40 mg total) by mouth 2 (two) times daily.   polyethylene glycol powder (GLYCOLAX /MIRALAX ) 17 GM/SCOOP powder Take 8.5 g by mouth daily. (Patient taking differently: Take 8.5 g by mouth as needed for mild constipation.)   umeclidinium-vilanterol (ANORO ELLIPTA ) 62.5-25 MCG/ACT AEPB Inhale 1 puff into the lungs daily.   Physical Exam:    VS:  BP 124/84 (BP Location: Left Arm, Patient Position: Sitting)   Pulse 77   Ht 6\' 6"  (1.981 m)   Wt 238 lb 9.6 oz (108.2 kg)   SpO2 93%   BMI 27.57 kg/m    Wt Readings from Last 3 Encounters:  04/22/24 238 lb 9.6 oz (108.2 kg)  04/21/24 241 lb 12.8 oz (109.7 kg)  04/19/24 247 lb 15.9 oz (112.5 kg)    GEN: Well nourished, well developed in no acute distress NECK: No JVD; No carotid bruits  CARDIAC: RRR, no murmurs, rubs, gallops RESPIRATORY:  Clear to auscultation without rales, wheezing or rhonchi  ABDOMEN: Soft, non-tender, non-distended EXTREMITIES:  No edema; No acute deformity     Asessement and Plan:.    Chronic systolic HF: Echo in February 2024 showed EF 30 to 35%, concentric LVH, grade 1 DD.  Today patient appears euvolemic and well compensated on exam, he denies increased shortness of breath, lower extremity edema, orthopnea or PND.  His wife notes that he has been consistently losing weight in recent months.  Patient denies any recent cocaine use, notes he has not used since February.  Patient appears euvolemic and well compensated on exam today. GDMT has been limited by kidney function.  Echocardiogram ordered at last office visit, has not yet been completed, patient notes with frequent hospitalizations he has not been able to complete.  He will schedule today  before leaving as he is now anticipating surgery for adenocarcinoma.  Anemia/gastric differentiated adeoncarcinoma: Patient with 4 blood transfusions since last office office visit in February.  Patient has undergone endoscopy, colonoscopy and push enteroscopy.  Recently found to have gastric ulcers s/p biopsies with invasive moderately differentiated adenocarcinoma.  He has been followed by gastrology and has been receiving iron  transfusions with hematology/oncology, has been referred to oncology given recent findings.  Hypertension: Blood pressure today 124/84.  Continue current antihypertensive regimen.  Palpitations: Cardiac monitor worn last month overall reassuring results, predominant rhythm was sinus rhythm.  Patient had PVC burden of 4%, no evidence of atrial fibrillation or any sustained arrhythmias.  There were no triggered events.  Today he reports palpitations have been improving, reviewed monitor results with patient and his wife, reassured.  CVA/TIA: Most recent TIA in February 2024 with MRI/MRI findings of several intracranial circulation abnormalities. Patient followed by PCP and neurology.  Substance abuse: Patient with history of cocaine use, reports that he has not used since February.  Congratulated and encouraged continued cessation.  TIA/CVA/intracranial atherosclerotic cerebrovascular disease: Most recent TIA in February 2024 with MRI findings of several intracranial circulation abnormalities.  Patient has difficulty with speech and some weakness.  Patient is on Plavix  and aspirin , managed by PCP and neurology.  CKD: Last creatinine 1.66.  Monitor managed per PCP/nephrology.   Disposition: F/u with Dr. Alvis Ba in three months or sooner if needed.   Signed, Enrika Aguado D Danner Paulding, NP

## 2024-04-20 NOTE — Progress Notes (Signed)
 Patient with gastric ulcer s/p biopsies with Invasive moderately differentiated adenocarcinoma  I spoke with the patient wife Raymond Barrera who is also the primary caregiver of the patient over the phone in detail today .   She verbalizes understanding of this finding and will follow up with oncology . Upon further questioning patient has one kindey / CKD and doesn't seem to have a nephrologist who can give clearance for contrast based study for Staging CT . They will reach out to the last nephrologist and will follow up with oncology as well. I have messaged patient primary hematology/oncology team here at [facility] about these findings as well

## 2024-04-21 ENCOUNTER — Encounter: Payer: Self-pay | Admitting: Primary Care

## 2024-04-21 ENCOUNTER — Ambulatory Visit (INDEPENDENT_AMBULATORY_CARE_PROVIDER_SITE_OTHER): Admitting: Primary Care

## 2024-04-21 VITALS — BP 133/84 | HR 77 | Temp 98.1°F | Ht 78.0 in | Wt 241.8 lb

## 2024-04-21 DIAGNOSIS — J449 Chronic obstructive pulmonary disease, unspecified: Secondary | ICD-10-CM | POA: Diagnosis not present

## 2024-04-21 DIAGNOSIS — F17210 Nicotine dependence, cigarettes, uncomplicated: Secondary | ICD-10-CM | POA: Diagnosis not present

## 2024-04-21 DIAGNOSIS — G4730 Sleep apnea, unspecified: Secondary | ICD-10-CM | POA: Diagnosis not present

## 2024-04-21 DIAGNOSIS — G4761 Periodic limb movement disorder: Secondary | ICD-10-CM | POA: Diagnosis not present

## 2024-04-21 MED ORDER — ROPINIROLE HCL 2 MG PO TABS
2.0000 mg | ORAL_TABLET | Freq: Every day | ORAL | 1 refills | Status: DC
Start: 2024-04-21 — End: 2024-07-29

## 2024-04-21 NOTE — Progress Notes (Signed)
 @Patient  ID: Raymond Barrera., male    DOB: 1961-06-06, 63 y.o.   MRN: [id number]  No chief complaint on file.   Referring provider: Meldon Sport, MD  HPI: 63 year old male, current smoker. PMH significant for chronic systolic heart failure, CVA, HTN, diabetes, hyperlipidemia, hx renal cell carcinoma.    Previous [facility] pulmonary encounter:  03/03/2023 Patient presents today for sleep consults. Patient had a CVA in February 2024. He has associated right sided weakness and speech impairment. He has symptoms of loud snoring and witnessed apnea. Wife is concerned. He feels his sleep is restless. He will wake himself up at times snoring. Typical bedtime is between 12am-1am. His sleep schedule really can vary. It can take him hours to fall asleep. He wakes up several times a night. At times he starts his day as early as 3am. His father had sleep apnea and wore CPAP.   He is a current smoker. He smokes on average 3 cigarettes a day. He has some baseline shortness of breath and chronic cough. No pulmonary function testing on file. He ambulates with cane. He attends physical therapy three days a week. No oxygen  use.   Sleep questionnaire Symptoms-  loud snoring, witnessed apnea, restless sleep  Prior sleep study- none Bedtime- 12am-1am Time to fall asleep- typically a long time/ varies  Nocturnal awakenings- several times  Out of bed/start of day- some days he is up at 3am Weight changes- no Do you operate heavy machinery- no Do you currently wear CPAP- no Do you current wear oxygen - no Epworth- 18   05/28/2023 Patient presents today to review sleep study results and PFTs.  Patient had split-night sleep study on 04/08/2023 that showed mild obstructive sleep apnea, AHI 5.5 an hour with SpO2 low 83%.  He had severe periodic limb movement during sleep study with associated arousals. His wife tells me that his legs are constantly moving before going to sleep and leg movement wakes him up at night.    He has daily dyspnea symptoms and chronic cough. Current someday smoker. He is not currently on BD regimen.    07/14/2023 Patient presents today for CPAP compliance.  Split-night sleep study on 04/08/2023 showed mild obstructive sleep apnea, AHI 5.5 an hour with SpO2 low 83%.  Patient had severe periodic limb movement during sleep study with associated arousals.  Mild severity of his OSA CPAP not necessary at this time.  During her last office visit he was started on Requip  for restless leg symptoms. Was also started patient Anoro Ellipta  for moderate COPD.  He is sleeping through the night better since starting Requip . Per is his he wakes up at 2am because he is hungry. ANORO has been helping his breathing. He has a slight cough currently due to head cold. He has cut back on smoking, down to cigarettes a day.  They are having a hard time affording several of his medications due to finance issues. Changing PCP, needs refill of Plavix , Jardiance  and gabapentin  prescriptions.     04/21/2024 Discussed the use of AI scribe software for clinical note transcription with the patient, who gave verbal consent to proceed.  History of Present Illness   Raymond Barrera. "Raymond Barrera" is a 63 year old male with COPD, RLS and sleep apnea who presents for a routine follow-up. He is accompanied by his partner.  He has COPD managed with Anora once daily, which he finds effective. He smokes one to two cigarettes daily, influenced by  stress levels. He experiences occasional dyspnea, particularly with exertion, but has no cough, sputum production, wheezing, or chest tightness.  He has mild sleep apnea, confirmed by a sleep study last May showing 5.5 apneic events per hour. He experiences occasional snoring and apneic episodes during sleep, requiring his partner to wake him. He has not used CPAP therapy.  His restless leg syndrome symptoms have worsened, particularly at night, causing significant disruption. He is on  gabapentin  300 mg, reduced from 400 mg due to dizziness, forgetfulness, and irritability. Despite this, gabapentin  and Tylenol  are not effectively managing his pain, leading to increased irritability and discomfort.  He has a recent diagnosis of a cancerous ulcer, managed by Dr. Alita Barrera. No additional pain management has been provided beyond gabapentin  and Tylenol .      Pulmonary function testing 03/10/23 >> FVC 3.24 (51%), FEV1 2.10 (44%), ratio 65, DLCO 23.71 (67%) Moderate obstructive airway disease, moderate diffusion defect  Allergies  Allergen Reactions   Poison Ivy Extract [Poison Ivy Extract] Itching and Rash   Coreg  [Carvedilol ] Itching    Itching and dizziness    Immunization History  Administered Date(s) Administered   Influenza, Seasonal, Injecte, Preservative Fre 10/01/2023   Influenza,inj,Quad PF,6+ Mos 10/18/2014, 09/27/2015, 09/16/2017   Moderna Sars-Covid-2 Vaccination 03/22/2020, 04/19/2020   PNEUMOCOCCAL CONJUGATE-20 01/01/2024   Pneumococcal Polysaccharide-23 03/09/2013, 10/18/2014, 09/27/2015, 06/18/2018   Tdap 09/27/2015    Past Medical History:  Diagnosis Date   Ambulates with cane    straight - uses occasionally   Anxiety    due to the stroke   Arthritis    Back pain    hx of buldging disc   Complication of anesthesia    took a while for him to wake up after previous anesthesia   CVA (cerebral vascular accident) (HCC) 10/07/2020   Diabetes mellitus without complication (HCC)    Family history of adverse reaction to anesthesia    "sometimes mom has a hard time waking up"   High cholesterol    takes Zocor  daily   Hypertension    takes Benazepril  and HCTZ  daily   Joint pain    Joint swelling    Memory impairment    occassional - from stroke   Myocardial infarction (HCC) 1987   Pneumonia    hx of-80's   Shortness of breath dyspnea    do to pain   Sleep apnea    never had a sleep study,but states Dr. Colby Barrera says he has it   Slurred speech     Stroke (HCC) 08/2013   7 mini-strokes, last stroke 2017   TIA (transient ischemic attack) 2014   x 7    Urinary frequency     Tobacco History: Social History   Tobacco Use  Smoking Status Some Days   Current packs/day: 0.25   Average packs/day: 0.3 packs/day for 47.0 years (11.8 ttl pk-yrs)   Types: Cigarettes   Passive exposure: Current  Smokeless Tobacco Never  Tobacco Comments   Currently smokes about 2 cigarettes per day.  07/14/2023 hfb   Ready to quit: Not Answered Counseling given: Not Answered Tobacco comments: Currently smokes about 2 cigarettes per day.  07/14/2023 hfb   Outpatient Medications Prior to Visit  Medication Sig Dispense Refill   Accu-Chek Softclix Lancets lancets SMARTSIG:Topical 2-3 Times Daily     acetaminophen  (TYLENOL ) 325 MG tablet Take 2 tablets (650 mg total) by mouth every 6 (six) hours as needed for mild pain (pain score 1-3) (or Fever >/= 101).  albuterol  (VENTOLIN  HFA) 108 (90 Base) MCG/ACT inhaler Inhale 2 puffs into the lungs every 6 (six) hours as needed for wheezing or shortness of breath (cough). 8 g 2   amLODipine  (NORVASC ) 2.5 MG tablet Take 1 tablet (2.5 mg total) by mouth daily. 90 tablet 3   aspirin  EC 81 MG tablet Take 1 tablet (81 mg total) by mouth daily with breakfast. Swallow whole. 30 tablet 12   atorvastatin  (LIPITOR) 40 MG tablet Take 1 tablet (40 mg total) by mouth daily. 90 tablet 3   Camphor-Menthol -Methyl Sal (SALONPAS EX) Apply topically. As needed     clopidogrel  (PLAVIX ) 75 MG tablet Take 1 tablet (75 mg total) by mouth daily. (Patient not taking: Reported on 04/15/2024) 30 tablet 11   DULoxetine  (CYMBALTA ) 30 MG capsule Take 30 mg by mouth daily.     ferrous sulfate  325 (65 FE) MG EC tablet Take 1 tablet (325 mg total) by mouth every Tuesday, Thursday, Saturday, and Sunday. 45 tablet 3   gabapentin  (NEURONTIN ) 300 MG capsule Take 1 capsule (300 mg total) by mouth at bedtime. 90 capsule 1   JARDIANCE  10 MG TABS tablet  Take 1 tablet (10 mg total) by mouth daily. 30 tablet 3   pantoprazole  (PROTONIX ) 40 MG tablet Take 1 tablet (40 mg total) by mouth 2 (two) times daily. 60 tablet 2   polyethylene glycol powder (GLYCOLAX /MIRALAX ) 17 GM/SCOOP powder Take 8.5 g by mouth daily.     rOPINIRole  (REQUIP ) 1 MG tablet Take 1 tablet (1 mg total) by mouth at bedtime. 90 tablet 1   umeclidinium-vilanterol (ANORO ELLIPTA ) 62.5-25 MCG/ACT AEPB Inhale 1 puff into the lungs daily. 60 each 3   No facility-administered medications prior to visit.      Review of Systems  Review of Systems  Constitutional:  Positive for fatigue.  HENT: Negative.    Respiratory: Negative.  Negative for cough, wheezing and stridor.   Cardiovascular: Negative.   Psychiatric/Behavioral:  Positive for sleep disturbance.    Physical Exam  There were no vitals taken for this visit. Physical Exam Constitutional:      Appearance: Normal appearance.  HENT:     Head: Normocephalic and atraumatic.  Cardiovascular:     Rate and Rhythm: Normal rate and regular rhythm.  Pulmonary:     Effort: Pulmonary effort is normal.     Breath sounds: Normal breath sounds. No wheezing or rhonchi.  Musculoskeletal:        General: Normal range of motion.  Skin:    General: Skin is warm and dry.  Neurological:     General: No focal deficit present.     Mental Status: He is alert and oriented to person, place, and time. Mental status is at baseline.  Psychiatric:        Mood and Affect: Mood normal.        Behavior: Behavior normal.        Thought Content: Thought content normal.        Judgment: Judgment normal.      Lab Results:  CBC    Component Value Date/Time   WBC 5.2 04/15/2024 1136   RBC 4.25 04/15/2024 1136   HGB 10.0 (L) 04/15/2024 1136   HGB 6.7 (LL) 01/26/2024 1620   HCT 34.0 (L) 04/15/2024 1136   HCT 24.7 (L) 01/26/2024 1620   PLT 288 04/15/2024 1136   PLT 764 (H) 01/26/2024 1620   MCV 80.0 04/15/2024 1136   MCV 67 (L)  01/26/2024 1620  MCH 23.5 (L) 04/15/2024 1136   MCHC 29.4 (L) 04/15/2024 1136   RDW 32.8 (H) 04/15/2024 1136   RDW 19.8 (H) 01/26/2024 1620   LYMPHSABS 1.4 04/15/2024 1136   LYMPHSABS 1.3 01/01/2024 1012   MONOABS 0.4 04/15/2024 1136   EOSABS 0.2 04/15/2024 1136   EOSABS 0.1 01/01/2024 1012   BASOSABS 0.0 04/15/2024 1136   BASOSABS 0.0 01/01/2024 1012    BMET    Component Value Date/Time   NA 136 04/15/2024 1136   NA 139 01/26/2024 1620   K 4.3 04/15/2024 1136   CL 106 04/15/2024 1136   CO2 22 04/15/2024 1136   GLUCOSE 111 (H) 04/15/2024 1136   BUN 23 04/15/2024 1136   BUN 28 (H) 01/26/2024 1620   CREATININE 1.66 (H) 04/15/2024 1136   CALCIUM  11.3 (H) 04/15/2024 1136   GFRNONAA 45 (L) 04/15/2024 1136   GFRAA 40 (L) 04/12/2020 1425    BNP    Component Value Date/Time   BNP 6.9 04/18/2019 1138    ProBNP No results found for: "PROBNP"  Imaging: No results found.   Assessment & Plan:   1. Mild sleep apnea (Primary) - Ambulatory Referral for DME  2. Periodic limb movement  3. Chronic obstructive pulmonary disease, unspecified COPD type (HCC)  Assessment and Plan    Chronic obstructive pulmonary disease (COPD) COPD is well-managed with Anora once daily. Occasional dyspnea occurs with activity. No active cough. He has cut back on smoking. He can afford Anoro and reports no issues with its use. - Continue Anoro Ellipta  one puff daily.  Restless legs syndrome Restless legs syndrome is worsening, causing significant discomfort and affecting sleep.  Partner reports nocturnal leg movements starting in the afternoon and persisting until he moves. - Increase Requip  to 2 mg if spastic leg movements persist.  Mild sleep apnea Mild sleep apnea with 5.5 apneic events per hour from last year's sleep study. Symptoms include loud snoring and occasional apneic episodes. CPAP therapy is recommended due to worsening snoring and sleep disturbances. He has never used CPAP  before. - Order CPAP auto settings 5-15cm h20 - Advised patient aim to wear CPAP nightly 4-6 hours - FU in 8 weeks for compliance check     Antonio Baumgarten, NP 04/21/2024

## 2024-04-21 NOTE — Patient Instructions (Signed)
 -CHRONIC OBSTRUCTIVE PULMONARY DISEASE (COPD): COPD is a chronic lung condition that makes it hard to breathe. Your COPD is well-managed with Anora, which you should continue taking once daily. You mentioned occasional shortness of breath with activity and smoking 1-2 cigarettes per day, which is influenced by stress.  -RESTLESS LEGS SYNDROME: Restless legs syndrome causes an uncontrollable urge to move your legs, usually due to discomfort. Your symptoms have worsened, especially at night. You are currently taking gabapentin  300 mg, but it is not fully effective. If your leg movements continue, you should increase Requip  to 2 mg.  -MILD SLEEP APNEA: Sleep apnea is a condition where you have pauses in breathing or shallow breaths during sleep. Your sleep study showed mild sleep apnea with 5.5 events per hour. Due to worsening snoring and sleep disturbances, CPAP therapy is recommended. You should use the CPAP machine during sleep for at least four hours.  INSTRUCTIONS: Please follow up with Dr. Alita Irwin for the management of your cancerous ulcer. Continue taking Anora once daily for COPD. If your restless leg symptoms persist, increase Requip  to 2 mg. Start using the CPAP machine during sleep for at least four hours each night. If you have any questions or concerns, please contact our office.  Follow-up 8 weeks with BETH NP   CPAP and BIPAP Information CPAP and BIPAP use air pressure to keep your airways open and help you breathe well. CPAP and BIPAP use different amounts of pressure. Your health care provider will tell you whether CPAP or BIPAP would be best for you. CPAP stands for continuous positive airway pressure. With CPAP, the amount of pressure stays the same while you breathe in and out. BIPAP stands for bi-level positive airway pressure. With BIPAP, the amount of pressure will be higher when you breathe in and lower when you breathe out. This allows you to take bigger breaths. CPAP or BIPAP  may be used in the hospital or at home. You may need to have a sleep study before your provider can order a device for you to use at home. What are the advantages? CPAP and BIPAP are most often used for obstructive sleep apnea to keep the airways from collapsing when the muscles relax during sleep. CPAP or BIPAP can be used if you have: Chronic obstructive pulmonary disease. Heart failure. Medical conditions that cause muscle weakness. Other problems that cause breathing to be shallow, weak, or difficult. What are the risks? Your provider will talk with you about risks. These may include: Sores on your nose or face caused from the mask, prongs, or nasal pillows. Dry or stuffy nose or nosebleeds. Feeling gassy or bloated. Sinus or lung infection if the equipment is not cleaned well. When should CPAP or BIPAP be used? In most cases, CPAP or BIPAP is used during sleep at night or whenever the main sleep time happens. It's also used during naps. People with some medical conditions may need to wear the mask when they're awake. Follow instructions from your provider about when to use your CPAP or BIPAP. What happens during CPAP or BIPAP?  Both CPAP and BIPAP use a small machine that uses electricity to create air pressure. A long tube connects the device to a plastic mask. Air is blown through the mask into your nose or mouth. The amount of pressure that's used to blow the air can be adjusted. Your provider will set the pressure setting and help you find the best mask for you. Tips for using the mask  There are different types and sizes of masks. If your mask does not fit well, talk with your provider about getting a different one. Some common types of masks include: Full face masks, which fit over the mouth and nose. Nasal masks, which fit over the nose. Nasal pillow or prong masks, which fit into the nostrils. The mask needs to be snug to your face, so some people feel trapped or closed in at  first. If you feel this way, you may need to get used to the mask. Hold the mask loosely over your nose or mouth and then gradually put the the mask on more snugly. Slowly increase the amount of time you use the mask. If you have trouble with your mask not fitting well or leaking, talk with your provider. Do not stop using the mask. Tips for using the device Follow instructions from your provider about how to and how often to use the device. For home use, CPAP and BIPAP devices come from home health care companies. There are many different brands. Your health insurance company will help to decide which device you get. Keep the CPAP or BIPAP device and attachments clean. Ask your home health care company or check the instruction book for cleaning instructions. Make sure the humidifier is filled with germ-free (sterile) water  and is working correctly. This will help prevent a dry or stuffy nose or nosebleeds. A nasal saline mist or spray may keep your nose from getting dry and sore. Do not eat or drink while the CPAP or BIPAP device is on. Food or drinks could get pushed into your lungs by the pressure of the CPAP or BIPAP. Follow these instructions at home: Take over-the-counter and prescription medicines only as told by your provider. Do not smoke, vape, or use nicotine  or tobacco. Contact a health care provider if: You have redness or pressure sores on your head, face, mouth, or nose from the mask or headgear. You have trouble using the CPAP or BIPAP device. You have trouble going to sleep or staying asleep. Someone tells you that you snore even when wearing your CPAP or BIPAP device. Get help right away if: You have trouble breathing. You feel confused. These symptoms may be an emergency. Get help right away. Call 911. Do not wait to see if the symptoms will go away. Do not drive yourself to the hospital. This information is not intended to replace advice given to you by your health care  provider. Make sure you discuss any questions you have with your health care provider. Document Revised: 03/11/2023 Document Reviewed: 03/11/2023 Elsevier Patient Education  2024 [employer].

## 2024-04-22 ENCOUNTER — Ambulatory Visit: Attending: Cardiology | Admitting: Cardiology

## 2024-04-22 ENCOUNTER — Encounter: Payer: Self-pay | Admitting: Cardiology

## 2024-04-22 VITALS — BP 124/84 | HR 77 | Ht 78.0 in | Wt 238.6 lb

## 2024-04-22 DIAGNOSIS — F1410 Cocaine abuse, uncomplicated: Secondary | ICD-10-CM

## 2024-04-22 DIAGNOSIS — N1832 Chronic kidney disease, stage 3b: Secondary | ICD-10-CM | POA: Diagnosis not present

## 2024-04-22 DIAGNOSIS — I5022 Chronic systolic (congestive) heart failure: Secondary | ICD-10-CM

## 2024-04-22 DIAGNOSIS — I129 Hypertensive chronic kidney disease with stage 1 through stage 4 chronic kidney disease, or unspecified chronic kidney disease: Secondary | ICD-10-CM

## 2024-04-22 DIAGNOSIS — R002 Palpitations: Secondary | ICD-10-CM

## 2024-04-22 DIAGNOSIS — E782 Mixed hyperlipidemia: Secondary | ICD-10-CM

## 2024-04-22 DIAGNOSIS — G459 Transient cerebral ischemic attack, unspecified: Secondary | ICD-10-CM

## 2024-04-22 NOTE — Patient Instructions (Signed)
 Medication Instructions:  No changes *If you need a refill on your cardiac medications before your next appointment, please call your pharmacy*  Lab Work: No Labs  Testing/Procedures: Your physician has requested that you have an echocardiogram. Echocardiography is a painless test that uses sound waves to create images of your heart. It provides your doctor with information about the size and shape of your heart and how well your heart's chambers and valves are working. This procedure takes approximately one hour. There are no restrictions for this procedure. Please do NOT wear cologne, perfume, aftershave, or lotions (deodorant is allowed). Please arrive 15 minutes prior to your appointment time.  Please note: We ask at that you not bring children with you during ultrasound (echo/ vascular) testing. Due to room size and safety concerns, children are not allowed in the ultrasound rooms during exams. Our front office staff cannot provide observation of children in our lobby area while testing is being conducted. An adult accompanying a patient to their appointment will only be allowed in the ultrasound room at the discretion of the ultrasound technician under special circumstances. We apologize for any inconvenience.   Follow-Up: At [facility], you and your health needs are our priority.  As part of our continuing mission to provide you with exceptional heart care, our providers are all part of one team.  This team includes your primary Cardiologist (physician) and Advanced Practice Providers or APPs (Physician Assistants and Nurse Practitioners) who all work together to provide you with the care you need, when you need it.  Your next appointment:   3 month(s)  Provider:   Luana Rumple, MD    We recommend signing up for the patient portal called "MyChart".  Sign up information is provided on this After Visit Summary.  MyChart is used to connect with patients for Virtual Visits  (Telemedicine).  Patients are able to view lab/test results, encounter notes, upcoming appointments, etc.  Non-urgent messages can be sent to your provider as well.   To learn more about what you can do with MyChart, go to [email or web address].

## 2024-04-26 DIAGNOSIS — D638 Anemia in other chronic diseases classified elsewhere: Secondary | ICD-10-CM | POA: Diagnosis not present

## 2024-04-26 DIAGNOSIS — G4733 Obstructive sleep apnea (adult) (pediatric): Secondary | ICD-10-CM | POA: Diagnosis not present

## 2024-04-26 DIAGNOSIS — N1832 Chronic kidney disease, stage 3b: Secondary | ICD-10-CM | POA: Diagnosis not present

## 2024-04-27 ENCOUNTER — Inpatient Hospital Stay

## 2024-04-29 ENCOUNTER — Ambulatory Visit (INDEPENDENT_AMBULATORY_CARE_PROVIDER_SITE_OTHER): Payer: MEDICAID | Admitting: Internal Medicine

## 2024-04-29 ENCOUNTER — Encounter: Payer: Self-pay | Admitting: Internal Medicine

## 2024-04-29 VITALS — BP 122/74 | HR 79 | Ht 78.0 in | Wt 241.4 lb

## 2024-04-29 DIAGNOSIS — G6289 Other specified polyneuropathies: Secondary | ICD-10-CM | POA: Diagnosis not present

## 2024-04-29 DIAGNOSIS — Z7984 Long term (current) use of oral hypoglycemic drugs: Secondary | ICD-10-CM | POA: Diagnosis not present

## 2024-04-29 DIAGNOSIS — D500 Iron deficiency anemia secondary to blood loss (chronic): Secondary | ICD-10-CM

## 2024-04-29 DIAGNOSIS — F331 Major depressive disorder, recurrent, moderate: Secondary | ICD-10-CM

## 2024-04-29 DIAGNOSIS — I10 Essential (primary) hypertension: Secondary | ICD-10-CM | POA: Diagnosis not present

## 2024-04-29 DIAGNOSIS — C169 Malignant neoplasm of stomach, unspecified: Secondary | ICD-10-CM

## 2024-04-29 DIAGNOSIS — I5022 Chronic systolic (congestive) heart failure: Secondary | ICD-10-CM

## 2024-04-29 DIAGNOSIS — E1169 Type 2 diabetes mellitus with other specified complication: Secondary | ICD-10-CM

## 2024-04-29 DIAGNOSIS — N1832 Chronic kidney disease, stage 3b: Secondary | ICD-10-CM | POA: Diagnosis not present

## 2024-04-29 DIAGNOSIS — C166 Malignant neoplasm of greater curvature of stomach, unspecified: Secondary | ICD-10-CM | POA: Insufficient documentation

## 2024-04-29 MED ORDER — DULOXETINE HCL 30 MG PO CPEP: 30.0000 mg | ORAL_CAPSULE | Freq: Two times a day (BID) | ORAL | 3 refills | Status: DC

## 2024-04-29 NOTE — Assessment & Plan Note (Signed)
 Hb: ~8 usually, had 4 U PRBC transfusion in 04/25 Had iron  transfusions as well Likely due to occult GI bleeding Followed by GI and hematology Continue pantoprazole  for PUD

## 2024-04-29 NOTE — Progress Notes (Signed)
 New Patient Office Visit  Subjective:  Patient ID: Raymond Weidler., male    DOB: [birth year]  Age: 63 y.o. MRN: [id number]  CC:  Chief Complaint  Patient presents with   Medical Management of Chronic Issues    4 month f/u     HPI Raymond Banh. is a 63 y.o. male with past medical history of CVA with expressive aphasia, HTN, COPD, type II DM, CKD, RCC s/p nephrectomy, restless leg syndrome and tobacco abuse who presents for f/u of his chronic medical conditions.  Gastric adenocarcinoma: He was recently diagnosed with gastric adenocarcinoma after EGD.  He is currently followed by GI, plan to see oncology for management of it.  He is taking pantoprazole  40 mg BID due to recent history of upper GI bleeding leading to symptomatic anemia.  He had 4 units PRBC transfusion for symptomatic anemia. He has had iron  transfusions as well. Denies any melena or hematochezia currently.  HTN: BP is well-controlled with amlodipine  2.5 mg QD.  He has stopped taking ramipril  10 mg once daily due to dizziness.  Patient denies headache, chest pain, dyspnea or palpitations.  COPD: He uses Anoro for it. Still smokes about 2 cigarettes/day.  He has exertional dyspnea, but denies any wheezing currently.  Type II DM: His last HbA1c was 6.8 in 01/25.  He takes Jardiance  10 mg once daily.  Denies any polyuria or polyphagia currently.  CKD: His last few BMP show GFR of 45, usually stays ~40.  Denies any dysuria or hematuria. He has been evaluated by Dr Carrolyn Clan recently.  CVA with residual expressive aphasia: Chart review suggests history of multiple CVAs in the past, last in 02/24.  He has seen neurologist for it.  He takes aspirin , Plavix  and statin currently.  He has chronic right sided weakness as well.  Due to his expressive aphasia, he feels agitated at times due to delayed speech- his speech has improved with ST.  MDD: He has been feeling low, anxious and has insomnia due to recent medical conditions and  hospitalizations.  He is undergoing workup for gastric adenocarcinoma.  He feels hopeless at times, has thoughts of better off dead, but denies suicidal ideation.  He is currently taking Cymbalta  30 mg QD, mainly for peripheral neuropathy.  Erectile dysfunction: He reports difficulty maintaining erection.  He has sexual desire.  Denies dysuria, hematuria or urethral discharge currently.   Past Medical History:  Diagnosis Date   Ambulates with cane    straight - uses occasionally   Anxiety    due to the stroke   Arthritis    Back pain    hx of buldging disc   Complication of anesthesia    took a while for him to wake up after previous anesthesia   CVA (cerebral vascular accident) (HCC) 10/07/2020   Diabetes mellitus without complication (HCC)    Family history of adverse reaction to anesthesia    "sometimes mom has a hard time waking up"   High cholesterol    takes Zocor  daily   Hypertension    takes Benazepril  and HCTZ  daily   Joint pain    Joint swelling    Memory impairment    occassional - from stroke   Myocardial infarction (HCC) 1987   Pneumonia    hx of-80's   Shortness of breath dyspnea    do to pain   Sleep apnea    never had a sleep study,but states Dr. Colby Daub says he has  it   Slurred speech    Stroke (HCC) 08/2013   7 mini-strokes, last stroke 2017   TIA (transient ischemic attack) 2014   x 7    Urinary frequency     Past Surgical History:  Procedure Laterality Date   ABDOMINAL EXPOSURE N/A 06/16/2018   Procedure: ABDOMINAL EXPOSURE;  Surgeon: Mayo Speck, MD;  Location: [facility] OR;  Service: Vascular;  Laterality: N/A;   ANKLE SURGERY  2008   left ankle-otif-[facility]   ANTERIOR LUMBAR FUSION Bilateral 06/16/2018   Procedure: LUMBAR 4-5 LUMBAR 5-SACRUM 1 ANTERIOR LUMBAR INTERBODY FUSION WITH INSTRUMENTATION AND ALLOGRAFT;  Surgeon: Virl Grimes, MD;  Location: [facility] OR;  Service: Orthopedics;  Laterality: Bilateral;   BACK SURGERY     COLONOSCOPY N/A 03/01/2024    Procedure: COLONOSCOPY;  Surgeon: Urban Garden, MD;  Location: [facility];  Service: Gastroenterology;  Laterality: N/A;   ENTEROSCOPY N/A 04/19/2024   Procedure: ENTEROSCOPY;  Surgeon: Hargis Lias, MD;  Location: [facility];  Service: Endoscopy;  Laterality: N/A;  9:0AM;ASA 3   ESOPHAGOGASTRODUODENOSCOPY N/A 03/01/2024   Procedure: EGD (ESOPHAGOGASTRODUODENOSCOPY);  Surgeon: Umberto Ganong, Bearl Limes, MD;  Location: [facility];  Service: Gastroenterology;  Laterality: N/A;   GIVENS CAPSULE STUDY N/A 03/02/2024   Procedure: IMAGING PROCEDURE, GI TRACT, INTRALUMINAL, VIA CAPSULE;  Surgeon: Vinetta Greening, DO;  Location: [facility];  Service: Endoscopy;  Laterality: N/A;   HEMATOMA EVACUATION Left 08/13/2018   Procedure: EVACUATION HEMATOMA LEFT ABDOMINAL WALL;  Surgeon: Mayo Speck, MD;  Location: [facility] OR;  Service: Vascular;  Laterality: Left;   HEMOSTASIS CLIP PLACEMENT  03/01/2024   Procedure: CONTROL OF HEMORRHAGE, GI TRACT, ENDOSCOPIC, BY CLIPPING OR OVERSEWING;  Surgeon: Urban Garden, MD;  Location: [facility];  Service: Gastroenterology;;   IR RADIOLOGIST EVAL & MGMT  07/27/2018   IR US  GUIDE BX ASP/DRAIN  07/14/2018   JOINT REPLACEMENT     both hips replaced    LUMBAR LAMINECTOMY/DECOMPRESSION MICRODISCECTOMY Right 10/17/2014   Procedure: LUMBAR LAMINECTOMY/DECOMPRESSION MICRODISCECTOMY 2 LEVELS;  Surgeon: Augusto Blonder, MD;  Location: [facility];  Service: Neurosurgery;  Laterality: Right;  Right L45 L5S1 laminectomy and foraminotomy   MASS EXCISION  09/13/2012   Procedure: EXCISION MASS;  Surgeon: Beau Bound, MD;  Location: [facility];  Service: General;  Laterality: N/A;   POLYPECTOMY  03/01/2024   Procedure: POLYPECTOMY;  Surgeon: Urban Garden, MD;  Location: [facility];  Service: Gastroenterology;;   ROBOTIC ASSITED PARTIAL NEPHRECTOMY Left 03/30/2019   Procedure: XI ROBOTIC ASSITED PARTIAL NEPHRECTOMY POSSIBLE RADICAL  NEPHRECTOMY;  Surgeon: Adelbert Homans, MD;  Location: [facility] OR[facility];  Service: Urology;  Laterality: Left;   SHOULDER ARTHROSCOPY WITH ROTATOR CUFF REPAIR Right 04/12/2020   Procedure: right shoulder arthroscopy, debridement, mini open rotator cuff tear repair;  Surgeon: Jasmine Mesi, MD;  Location: [facility] OR;  Service: Orthopedics;  Laterality: Right;   TONSILLECTOMY     TOTAL HIP ARTHROPLASTY Right 03/20/2015   TOTAL HIP ARTHROPLASTY Right 03/20/2015   Procedure: TOTAL HIP ARTHROPLASTY ANTERIOR APPROACH;  Surgeon: Saundra Curl, MD;  Location: [facility] OR;  Service: Orthopedics;  Laterality: Right;   TOTAL HIP ARTHROPLASTY Left 01/08/2016   Procedure: TOTAL HIP ARTHROPLASTY ANTERIOR APPROACH;  Surgeon: Saundra Curl, MD;  Location: [facility] OR;  Service: Orthopedics;  Laterality: Left;    Family History  Problem Relation Age of Onset   Heart disease Father    Colon cancer Neg Hx  Colon polyps Neg Hx     Social History   Socioeconomic History   Marital status: Married    Spouse name: [name] C.  Burch   Number of children: Not on file   Years of education: Not on file   Highest education level: 12th grade  Occupational History   Not on file  Tobacco Use   Smoking status: Some Days    Current packs/day: 0.25    Average packs/day: 0.3 packs/day for 47.0 years (11.8 ttl pk-yrs)    Types: Cigarettes    Passive exposure: Current   Smokeless tobacco: Never   Tobacco comments:    04/22/2024 Patient smokes 2 cigarettes daily    Currently smokes about 2 cigarettes per day.  07/14/2023 hfb    1 a day as of 04/29/2024  Vaping Use   Vaping status: Never Used  Substance and Sexual Activity   Alcohol use: Not Currently    Comment: quit 2012   Drug use: Not Currently    Types: Cocaine    Comment: many yrs ago., last time- late 2016   Sexual activity: Yes  Other Topics Concern   Not on file  Social History Narrative   Are you right handed or left handed? Right   Are you currently  employed ? disabled   What is your current occupation?   Do you live at home alone? NO   Who lives with you? Mother, Wife   What type of home do you live in: 1 story or 2 story? 1       Social Drivers of Corporate investment banker Strain: Medium Risk (12/28/2023)   Overall Financial Resource Strain (CARDIA)    Difficulty of Paying Living Expenses: Somewhat hard  Food Insecurity: Patient Declined (03/01/2024)   Hunger Vital Sign    Worried About Running Out of Food in the Last Year: Patient declined    Ran Out of Food in the Last Year: Patient declined  Recent Concern: Food Insecurity - Food Insecurity Present (12/28/2023)   Hunger Vital Sign    Worried About Running Out of Food in the Last Year: Sometimes true    Ran Out of Food in the Last Year: Sometimes true  Transportation Needs: No Transportation Needs (03/01/2024)   PRAPARE - Administrator, Civil Service (Medical): No    Lack of Transportation (Non-Medical): No  Recent Concern: Transportation Needs - Unmet Transportation Needs (12/28/2023)   PRAPARE - Transportation    Lack of Transportation (Medical): Yes    Lack of Transportation (Non-Medical): Yes  Physical Activity: Insufficiently Active (12/28/2023)   Exercise Vital Sign    Days of Exercise per Week: 1 day    Minutes of Exercise per Session: 10 min  Stress: Stress Concern Present (12/28/2023)   [employer] of Occupational Health - Occupational Stress Questionnaire    Feeling of Stress : To some extent  Social Connections: Unknown (03/01/2024)   Social Connection and Isolation Panel [NHANES]    Frequency of Communication with Friends and Family: Patient declined    Frequency of Social Gatherings with Friends and Family: Patient declined    Attends Religious Services: More than 4 times per year    Active Member of [employer] or Organizations: Yes    Attends Banker Meetings: Patient declined    Marital Status: Married  Catering manager Violence:  Unknown (03/01/2024)   Humiliation, Afraid, Rape, and Kick questionnaire    Fear of Current or Ex-Partner: No  Emotionally Abused: No    Physically Abused: Not on file    Sexually Abused: No    ROS Review of Systems  Constitutional:  Negative for chills and fever.  HENT:  Negative for congestion and sore throat.   Eyes:  Negative for pain and discharge.  Respiratory:  Positive for shortness of breath (Intermittent). Negative for cough.   Cardiovascular:  Negative for chest pain and palpitations.  Gastrointestinal:  Negative for diarrhea, nausea and vomiting.  Endocrine: Negative for polydipsia and polyuria.  Genitourinary:  Negative for dysuria and hematuria.  Musculoskeletal:  Positive for back pain and gait problem. Negative for neck pain and neck stiffness.  Skin:  Negative for rash.  Neurological:  Positive for speech difficulty and weakness (RUE and RLE). Negative for dizziness.  Psychiatric/Behavioral:  Positive for dysphoric mood and sleep disturbance. Negative for agitation and behavioral problems. The patient is nervous/anxious.     Objective:   Today's Vitals: BP 122/74   Pulse 79   Ht 6\' 6"  (1.981 m)   Wt 241 lb 6.4 oz (109.5 kg)   SpO2 97%   BMI 27.90 kg/m   Physical Exam Vitals reviewed.  Constitutional:      General: He is not in acute distress.    Appearance: He is not diaphoretic.  HENT:     Head: Normocephalic and atraumatic.     Nose: Nose normal.     Mouth/Throat:     Mouth: Mucous membranes are moist.  Eyes:     General: No scleral icterus.    Extraocular Movements: Extraocular movements intact.  Cardiovascular:     Rate and Rhythm: Normal rate and regular rhythm.     Heart sounds: Normal heart sounds. No murmur heard. Pulmonary:     Breath sounds: Normal breath sounds. No wheezing or rales.  Musculoskeletal:     Cervical back: Neck supple. No tenderness.     Right lower leg: No edema.     Left lower leg: No edema.  Skin:    General: Skin  is warm.     Findings: No rash.  Neurological:     General: No focal deficit present.     Mental Status: He is alert and oriented to person, place, and time.     Motor: Weakness (RUE and RLE - 4/5) present.     Comments: Expressive aphasia -improved  Psychiatric:        Mood and Affect: Mood is depressed.        Behavior: Behavior normal.     Assessment & Plan:   Problem List Items Addressed This Visit       Cardiovascular and Mediastinum   Essential hypertension   BP Readings from Last 1 Encounters:  04/29/24 122/74   Well-controlled with amlodipine  2.5 mg QD Was on ramipril  10 mg once daily, discontinued due to dizziness Counseled for compliance with the medications Advised DASH diet and moderate exercise/walking as tolerated       Chronic systolic heart failure (HCC)   Last Echo showed LVEF of 35-40% Appears euvolemic currently On Jardiance  10 mg QD Followed by cardiology        Digestive   Gastric adenocarcinoma (HCC)   Recent hospitalization for acute blood loss anemia EGD showed gastritis and biopsy suggestive gastric adenocarcinoma Followed by GI, has appointment with oncology to discuss management        Endocrine   Type 2 diabetes mellitus with other specified complication [facility]) - Primary   Lab Results  Component Value  Date   HGBA1C 6.8 (H) 01/01/2024   Well controlled Associated with HTN, HLD and h/o CVA On Jardiance  10 mg QD, needs to be compliant Advised to follow diabetic diet Needs to be on statin, on ACEi -restart atorvastatin  F/u CMP, HbA1c and lipid panel Diabetic eye exam: Advised to follow up with Ophthalmology for diabetic eye exam      Relevant Orders   Bayer DCA Hb A1c Waived     Nervous and Auditory   Peripheral neuropathy   Decrease dose of gabapentin  to 300 mg nightly only due to delusions and hallucinations On Cymbalta  30 mg QD for peripheral neuropathy      Relevant Medications   DULoxetine  (CYMBALTA ) 30 MG capsule      Genitourinary   CKD (chronic kidney disease), stage III (HCC)   Chart review suggests GFR ~ 40 Needs to maintain adequate hydration On Jardiance  currently Checked urine microalbumin/creatinine ratio Followed by nephrology now - Dr. Carrolyn Clan        Other   Iron  deficiency anemia due to chronic blood loss   Hb: ~8 usually, had 4 U PRBC transfusion in 04/25 Had iron  transfusions as well Likely due to occult GI bleeding Followed by GI and hematology Continue pantoprazole  for PUD      Moderate episode of recurrent major depressive disorder (HCC)   Uncontrolled Recently worse due to diagnosis of gastric adenocarcinoma Increase dose of Cymbalta  to 30 mg BID Advised to engage in activities of liking, continue religious activities      Relevant Medications   DULoxetine  (CYMBALTA ) 30 MG capsule     Outpatient Encounter Medications as of 04/29/2024  Medication Sig   Accu-Chek Softclix Lancets lancets SMARTSIG:Topical 2-3 Times Daily   acetaminophen  (TYLENOL ) 325 MG tablet Take 2 tablets (650 mg total) by mouth every 6 (six) hours as needed for mild pain (pain score 1-3) (or Fever >/= 101).   albuterol  (VENTOLIN  HFA) 108 (90 Base) MCG/ACT inhaler Inhale 2 puffs into the lungs every 6 (six) hours as needed for wheezing or shortness of breath (cough).   amLODipine  (NORVASC ) 2.5 MG tablet Take 1 tablet (2.5 mg total) by mouth daily.   aspirin  EC 81 MG tablet Take 1 tablet (81 mg total) by mouth daily with breakfast. Swallow whole.   atorvastatin  (LIPITOR) 40 MG tablet Take 1 tablet (40 mg total) by mouth daily.   Camphor-Menthol -Methyl Sal (SALONPAS EX) Apply topically. As needed   clopidogrel  (PLAVIX ) 75 MG tablet Take 1 tablet (75 mg total) by mouth daily.   ferrous sulfate  325 (65 FE) MG EC tablet Take 1 tablet (325 mg total) by mouth every Tuesday, Thursday, Saturday, and Sunday.   gabapentin  (NEURONTIN ) 300 MG capsule Take 1 capsule (300 mg total) by mouth at bedtime.   JARDIANCE  10  MG TABS tablet Take 1 tablet (10 mg total) by mouth daily.   pantoprazole  (PROTONIX ) 40 MG tablet Take 1 tablet (40 mg total) by mouth 2 (two) times daily.   polyethylene glycol powder (GLYCOLAX /MIRALAX ) 17 GM/SCOOP powder Take 8.5 g by mouth daily. (Patient taking differently: Take 8.5 g by mouth as needed for mild constipation.)   rOPINIRole  (REQUIP ) 2 MG tablet Take 1 tablet (2 mg total) by mouth at bedtime.   umeclidinium-vilanterol (ANORO ELLIPTA ) 62.5-25 MCG/ACT AEPB Inhale 1 puff into the lungs daily.   [DISCONTINUED] DULoxetine  (CYMBALTA ) 30 MG capsule Take 30 mg by mouth daily.   DULoxetine  (CYMBALTA ) 30 MG capsule Take 1 capsule (30 mg total) by mouth 2 (two)  times daily.   No facility-administered encounter medications on file as of 04/29/2024.    Follow-up: Return in about 3 months (around 07/30/2024) for Welcome to Medicare.   Meldon Sport, MD

## 2024-04-29 NOTE — Assessment & Plan Note (Signed)
 Lab Results  Component Value Date   HGBA1C 6.8 (H) 01/01/2024   Well controlled Associated with HTN, HLD and h/o CVA On Jardiance 10 mg QD, needs to be compliant Advised to follow diabetic diet Needs to be on statin, on ACEi -restart atorvastatin F/u CMP, HbA1c and lipid panel Diabetic eye exam: Advised to follow up with Ophthalmology for diabetic eye exam

## 2024-04-29 NOTE — Assessment & Plan Note (Signed)
 Decrease dose of gabapentin  to 300 mg nightly only due to delusions and hallucinations On Cymbalta  30 mg QD for peripheral neuropathy

## 2024-04-29 NOTE — Assessment & Plan Note (Signed)
 BP Readings from Last 1 Encounters:  04/29/24 122/74   Well-controlled with amlodipine  2.5 mg QD Was on ramipril  10 mg once daily, discontinued due to dizziness Counseled for compliance with the medications Advised DASH diet and moderate exercise/walking as tolerated

## 2024-04-29 NOTE — Patient Instructions (Addendum)
 Schedule your Medicare Annual Wellness Visit at checkout.  Please start taking Duloxetine  30 mg twice daily.  Please continue to take medications as prescribed.  Please continue to follow low carb diet and perform moderate exercise/walking as tolerated.

## 2024-04-29 NOTE — Assessment & Plan Note (Signed)
 Last Echo showed LVEF of 35-40% Appears euvolemic currently On Jardiance  10 mg QD Followed by cardiology

## 2024-04-29 NOTE — Assessment & Plan Note (Signed)
 Recent hospitalization for acute blood loss anemia EGD showed gastritis and biopsy suggestive gastric adenocarcinoma Followed by GI, has appointment with oncology to discuss management

## 2024-04-29 NOTE — Assessment & Plan Note (Signed)
 Uncontrolled Recently worse due to diagnosis of gastric adenocarcinoma Increase dose of Cymbalta  to 30 mg BID Advised to engage in activities of liking, continue religious activities

## 2024-04-29 NOTE — Assessment & Plan Note (Addendum)
 Chart review suggests GFR ~ 40 Needs to maintain adequate hydration On Jardiance  currently Checked urine microalbumin/creatinine ratio Followed by nephrology now - Dr. Carrolyn Clan

## 2024-05-02 ENCOUNTER — Inpatient Hospital Stay: Attending: Oncology | Admitting: Oncology

## 2024-05-02 ENCOUNTER — Encounter: Payer: Self-pay | Admitting: Oncology

## 2024-05-02 ENCOUNTER — Telehealth: Payer: Self-pay | Admitting: Gastroenterology

## 2024-05-02 ENCOUNTER — Inpatient Hospital Stay

## 2024-05-02 VITALS — BP 154/112 | HR 75 | Temp 97.7°F | Resp 16 | Wt 244.0 lb

## 2024-05-02 DIAGNOSIS — C162 Malignant neoplasm of body of stomach: Secondary | ICD-10-CM

## 2024-05-02 DIAGNOSIS — D500 Iron deficiency anemia secondary to blood loss (chronic): Secondary | ICD-10-CM

## 2024-05-02 DIAGNOSIS — N189 Chronic kidney disease, unspecified: Secondary | ICD-10-CM | POA: Diagnosis not present

## 2024-05-02 DIAGNOSIS — Z905 Acquired absence of kidney: Secondary | ICD-10-CM | POA: Diagnosis not present

## 2024-05-02 DIAGNOSIS — C641 Malignant neoplasm of right kidney, except renal pelvis: Secondary | ICD-10-CM

## 2024-05-02 DIAGNOSIS — N1832 Chronic kidney disease, stage 3b: Secondary | ICD-10-CM

## 2024-05-02 DIAGNOSIS — Z8553 Personal history of malignant neoplasm of renal pelvis: Secondary | ICD-10-CM | POA: Diagnosis not present

## 2024-05-02 DIAGNOSIS — C169 Malignant neoplasm of stomach, unspecified: Secondary | ICD-10-CM | POA: Diagnosis not present

## 2024-05-02 DIAGNOSIS — D509 Iron deficiency anemia, unspecified: Secondary | ICD-10-CM | POA: Diagnosis not present

## 2024-05-02 DIAGNOSIS — Z8673 Personal history of transient ischemic attack (TIA), and cerebral infarction without residual deficits: Secondary | ICD-10-CM | POA: Diagnosis not present

## 2024-05-02 DIAGNOSIS — Z720 Tobacco use: Secondary | ICD-10-CM | POA: Insufficient documentation

## 2024-05-02 LAB — COMPREHENSIVE METABOLIC PANEL WITH GFR
ALT: 23 U/L (ref 0–44)
AST: 16 U/L (ref 15–41)
Albumin: 3.8 g/dL (ref 3.5–5.0)
Alkaline Phosphatase: 88 U/L (ref 38–126)
Anion gap: 8 (ref 5–15)
BUN: 47 mg/dL — ABNORMAL HIGH (ref 8–23)
CO2: 22 mmol/L (ref 22–32)
Calcium: 10.7 mg/dL — ABNORMAL HIGH (ref 8.9–10.3)
Chloride: 108 mmol/L (ref 98–111)
Creatinine, Ser: 2.22 mg/dL — ABNORMAL HIGH (ref 0.61–1.24)
GFR, Estimated: 32 mL/min — ABNORMAL LOW (ref >60)
Glucose, Bld: 152 mg/dL — ABNORMAL HIGH (ref 70–99)
Potassium: 4.5 mmol/L (ref 3.5–5.1)
Sodium: 138 mmol/L (ref 135–145)
Total Bilirubin: 0.6 mg/dL (ref 0.0–1.2)
Total Protein: 7.4 g/dL (ref 6.5–8.1)

## 2024-05-02 LAB — CBC WITH DIFFERENTIAL/PLATELET
Abs Immature Granulocytes: 0.03 10*3/uL (ref 0.00–0.07)
Basophils Absolute: 0 10*3/uL (ref 0.0–0.1)
Basophils Relative: 0 %
Eosinophils Absolute: 0.1 10*3/uL (ref 0.0–0.5)
Eosinophils Relative: 1 %
HCT: 36.2 % — ABNORMAL LOW (ref 39.0–52.0)
Hemoglobin: 11 g/dL — ABNORMAL LOW (ref 13.0–17.0)
Immature Granulocytes: 0 %
Lymphocytes Relative: 12 %
Lymphs Abs: 1.2 10*3/uL (ref 0.7–4.0)
MCH: 24.9 pg — ABNORMAL LOW (ref 26.0–34.0)
MCHC: 30.4 g/dL (ref 30.0–36.0)
MCV: 82.1 fL (ref 80.0–100.0)
Monocytes Absolute: 0.6 10*3/uL (ref 0.1–1.0)
Monocytes Relative: 6 %
Neutro Abs: 8 10*3/uL — ABNORMAL HIGH (ref 1.7–7.7)
Neutrophils Relative %: 81 %
Platelets: 250 10*3/uL (ref 150–400)
RBC: 4.41 MIL/uL (ref 4.22–5.81)
RDW: 30 % — ABNORMAL HIGH (ref 11.5–15.5)
WBC: 10 10*3/uL (ref 4.0–10.5)
nRBC: 0 % (ref 0.0–0.2)

## 2024-05-02 LAB — FERRITIN: Ferritin: 14 ng/mL — ABNORMAL LOW (ref 24–336)

## 2024-05-02 LAB — IRON AND TIBC
Iron: 77 ug/dL (ref 45–182)
Saturation Ratios: 19 % (ref 17.9–39.5)
TIBC: 414 ug/dL (ref 250–450)
UIBC: 337 ug/dL

## 2024-05-02 LAB — FOLATE: Folate: 9.4 ng/mL (ref >5.9)

## 2024-05-02 LAB — VITAMIN B12: Vitamin B-12: 276 pg/mL (ref 180–914)

## 2024-05-02 NOTE — Progress Notes (Signed)
 Hematology-Oncology Clinic Note  Raymond Sport, MD   Reason for Referral: Gastric adenocarcinoma  Oncology History: I have reviewed his chart and materials related to his cancer extensively and collaborated history with the patient. Summary of oncologic history is as follows:  Oncology History  Gastric adenocarcinoma (HCC)  04/19/2024 Pathology Results   A. STOMACH, ABNORMAL MUCOSA BIOPSY:   Invasive moderately differentiated adenocarcinoma  Reactive gastropathy with intestinal metaplasia showing focal dysplasia  Negative for H. pylori    04/29/2024 Initial Diagnosis   Gastric adenocarcinoma (HCC)   Renal cell carcinoma (HCC)  03/30/2019 Cancer Staging   Staging form: Kidney, AJCC 8th Edition - Clinical stage from 03/30/2019: Stage I (cT1a, cN0, cM0) - Signed by Eduardo Grade, MD on 05/03/2024 Stage prefix: Initial diagnosis   05/03/2024 Initial Diagnosis   Renal cell carcinoma (HCC)       History of Presenting Illness: Raymond Barrera. 63 y.o. male is here for newly diagnosed gastric adenocarcinoma.  He is accompanied by his wife today.  He has a past medical history of renal cell carcinoma s/p nephrectomy, CKD, hypertension, diabetes, CVA with some residual aphasia.  Patient had a history of iron  deficiency anemia and abnormal CBC since 10/01/2023.  He was evaluated by GI and proceeded with EGD and colonoscopy which was delayed due to patient's cocaine use.  The first endoscopy done on 03/08/2024 did not reveal gastric adenocarcinoma.  He had an enteroscopy on 04/19/2024 for further evaluation of iron  deficiency anemia which showed a gastric ulcer, biopsy of which is consistent with gastric adenocarcinoma.  Patient received 1 dose of 1 g INFeD  on 03/24/2024 with some symptomatic improvement.  He continues to experience some dizziness and occasional hallucinations, potentially related to his current medications as per his wife which are Cymbalta  and gabapentin .  He continues to  report significant fatigue since a diagnosis of iron  deficiency anemia.  He was more active around the house prior to this helping wife with cooking, raking leaves, mowing the yard.  Currently is more fatigued but tries to be as active as possible.No recent weight loss or black stools. Appetite is good.  He was chronic smoker but has cut down significantly and is only smoking 1 cigarette a day currently, denies alcohol use.  Reports gastric cancer in his father.  Medical History: Past Medical History:  Diagnosis Date   Ambulates with cane    straight - uses occasionally   Anxiety    due to the stroke   Arthritis    Back pain    hx of buldging disc   Complication of anesthesia    took a while for him to wake up after previous anesthesia   CVA (cerebral vascular accident) (HCC) 10/07/2020   Diabetes mellitus without complication (HCC)    Family history of adverse reaction to anesthesia    "sometimes mom has a hard time waking up"   High cholesterol    takes Zocor  daily   Hypertension    takes Benazepril  and HCTZ  daily   Joint pain    Joint swelling    Memory impairment    occassional - from stroke   Myocardial infarction (HCC) 1987   Pneumonia    hx of-80's   Shortness of breath dyspnea    do to pain   Sleep apnea    never had a sleep study,but states Dr. Colby Daub says he has it   Slurred speech    Stroke (HCC) 08/2013   7 mini-strokes, last  stroke 2017   TIA (transient ischemic attack) 2014   x 7    Urinary frequency     Surgical history: Past Surgical History:  Procedure Laterality Date   ABDOMINAL EXPOSURE N/A 06/16/2018   Procedure: ABDOMINAL EXPOSURE;  Surgeon: Mayo Speck, MD;  Location: [facility] OR;  Service: Vascular;  Laterality: N/A;   ANKLE SURGERY  2008   left ankle-otif-[facility]   ANTERIOR LUMBAR FUSION Bilateral 06/16/2018   Procedure: LUMBAR 4-5 LUMBAR 5-SACRUM 1 ANTERIOR LUMBAR INTERBODY FUSION WITH INSTRUMENTATION AND ALLOGRAFT;  Surgeon: Virl Grimes, MD;   Location: [facility] OR;  Service: Orthopedics;  Laterality: Bilateral;   BACK SURGERY     COLONOSCOPY N/A 03/01/2024   Procedure: COLONOSCOPY;  Surgeon: Urban Garden, MD;  Location: [facility];  Service: Gastroenterology;  Laterality: N/A;   ENTEROSCOPY N/A 04/19/2024   Procedure: ENTEROSCOPY;  Surgeon: Hargis Lias, MD;  Location: [facility];  Service: Endoscopy;  Laterality: N/A;  9:0AM;ASA 3   ESOPHAGOGASTRODUODENOSCOPY N/A 03/01/2024   Procedure: EGD (ESOPHAGOGASTRODUODENOSCOPY);  Surgeon: Umberto Ganong, Bearl Limes, MD;  Location: [facility];  Service: Gastroenterology;  Laterality: N/A;   GIVENS CAPSULE STUDY N/A 03/02/2024   Procedure: IMAGING PROCEDURE, GI TRACT, INTRALUMINAL, VIA CAPSULE;  Surgeon: Vinetta Greening, DO;  Location: [facility];  Service: Endoscopy;  Laterality: N/A;   HEMATOMA EVACUATION Left 08/13/2018   Procedure: EVACUATION HEMATOMA LEFT ABDOMINAL WALL;  Surgeon: Mayo Speck, MD;  Location: [facility] OR;  Service: Vascular;  Laterality: Left;   HEMOSTASIS CLIP PLACEMENT  03/01/2024   Procedure: CONTROL OF HEMORRHAGE, GI TRACT, ENDOSCOPIC, BY CLIPPING OR OVERSEWING;  Surgeon: Urban Garden, MD;  Location: [facility];  Service: Gastroenterology;;   IR RADIOLOGIST EVAL & MGMT  07/27/2018   IR US  GUIDE BX ASP/DRAIN  07/14/2018   JOINT REPLACEMENT     both hips replaced    LUMBAR LAMINECTOMY/DECOMPRESSION MICRODISCECTOMY Right 10/17/2014   Procedure: LUMBAR LAMINECTOMY/DECOMPRESSION MICRODISCECTOMY 2 LEVELS;  Surgeon: Augusto Blonder, MD;  Location: [facility]URO ORS;  Service: Neurosurgery;  Laterality: Right;  Right L45 L5S1 laminectomy and foraminotomy   MASS EXCISION  09/13/2012   Procedure: EXCISION MASS;  Surgeon: Beau Bound, MD;  Location: [facility] ORS;  Service: General;  Laterality: N/A;   POLYPECTOMY  03/01/2024   Procedure: POLYPECTOMY;  Surgeon: Urban Garden, MD;  Location: [facility];  Service: Gastroenterology;;   ROBOTIC  ASSITED PARTIAL NEPHRECTOMY Left 03/30/2019   Procedure: XI ROBOTIC ASSITED PARTIAL NEPHRECTOMY POSSIBLE RADICAL NEPHRECTOMY;  Surgeon: Adelbert Homans, MD;  Location: [facility] ORS;  Service: Urology;  Laterality: Left;   SHOULDER ARTHROSCOPY WITH ROTATOR CUFF REPAIR Right 04/12/2020   Procedure: right shoulder arthroscopy, debridement, mini open rotator cuff tear repair;  Surgeon: Jasmine Mesi, MD;  Location: [facility] OR;  Service: Orthopedics;  Laterality: Right;   TONSILLECTOMY     TOTAL HIP ARTHROPLASTY Right 03/20/2015   TOTAL HIP ARTHROPLASTY Right 03/20/2015   Procedure: TOTAL HIP ARTHROPLASTY ANTERIOR APPROACH;  Surgeon: Saundra Curl, MD;  Location: [facility] OR;  Service: Orthopedics;  Laterality: Right;   TOTAL HIP ARTHROPLASTY Left 01/08/2016   Procedure: TOTAL HIP ARTHROPLASTY ANTERIOR APPROACH;  Surgeon: Saundra Curl, MD;  Location: [facility] OR;  Service: Orthopedics;  Laterality: Left;     Allergies:  is allergic to poison ivy extract [poison ivy extract] and coreg  [carvedilol ].  Medications:  Current Outpatient Medications  Medication Sig Dispense Refill   Accu-Chek Softclix Lancets lancets SMARTSIG:Topical 2-3 Times Daily  acetaminophen  (TYLENOL ) 325 MG tablet Take 2 tablets (650 mg total) by mouth every 6 (six) hours as needed for mild pain (pain score 1-3) (or Fever >/= 101).     albuterol  (VENTOLIN  HFA) 108 (90 Base) MCG/ACT inhaler Inhale 2 puffs into the lungs every 6 (six) hours as needed for wheezing or shortness of breath (cough). 8 g 2   amLODipine  (NORVASC ) 2.5 MG tablet Take 1 tablet (2.5 mg total) by mouth daily. 90 tablet 3   aspirin  EC 81 MG tablet Take 1 tablet (81 mg total) by mouth daily with breakfast. Swallow whole. 30 tablet 12   atorvastatin  (LIPITOR) 40 MG tablet Take 1 tablet (40 mg total) by mouth daily. 90 tablet 3   Camphor-Menthol -Methyl Sal (SALONPAS EX) Apply topically. As needed     clopidogrel  (PLAVIX ) 75 MG tablet Take 1 tablet (75 mg total) by  mouth daily. 30 tablet 11   DULoxetine  (CYMBALTA ) 30 MG capsule Take 1 capsule (30 mg total) by mouth 2 (two) times daily. 60 capsule 3   ferrous sulfate  325 (65 FE) MG EC tablet Take 1 tablet (325 mg total) by mouth every Tuesday, Thursday, Saturday, and Sunday. 45 tablet 3   gabapentin  (NEURONTIN ) 300 MG capsule Take 1 capsule (300 mg total) by mouth at bedtime. 90 capsule 1   JARDIANCE  10 MG TABS tablet Take 1 tablet (10 mg total) by mouth daily. 30 tablet 3   pantoprazole  (PROTONIX ) 40 MG tablet Take 1 tablet (40 mg total) by mouth 2 (two) times daily. 60 tablet 2   polyethylene glycol powder (GLYCOLAX /MIRALAX ) 17 GM/SCOOP powder Take 8.5 g by mouth daily. (Patient taking differently: Take 8.5 g by mouth as needed for mild constipation.)     rOPINIRole  (REQUIP ) 2 MG tablet Take 1 tablet (2 mg total) by mouth at bedtime. 30 tablet 1   umeclidinium-vilanterol (ANORO ELLIPTA ) 62.5-25 MCG/ACT AEPB Inhale 1 puff into the lungs daily. 60 each 3   No current facility-administered medications for this visit.    Review of Systems: Constitutional: Denies fevers, chills or abnormal night sweats Eyes: Denies blurriness of vision, double vision or watery eyes Ears, nose, mouth, throat, and face: Denies mucositis or sore throat Respiratory: Denies cough, dyspnea or wheezes Cardiovascular: Denies palpitation, chest discomfort or lower extremity swelling Gastrointestinal:  Denies nausea, heartburn or change in bowel habits Skin: Denies abnormal skin rashes Lymphatics: Denies new lymphadenopathy or easy bruising Neurological:Denies numbness, tingling or new weaknesses Behavioral/Psych: Mood is stable, no new changes  All other systems were reviewed with the patient and are negative.  Physical Examination: ECOG PERFORMANCE STATUS: 2 - Symptomatic, <50% confined to bed  Vitals:   05/02/24 1014 05/02/24 1019  BP: (!) 157/99 (!) 154/112  Pulse: 75   Resp: 16   Temp: 97.7 F (36.5 C)   SpO2: 97%     Filed Weights   05/02/24 1014  Weight: 244 lb 0.8 oz (110.7 kg)    GENERAL:alert, no distress and comfortable LYMPH:  no palpable lymphadenopathy in the cervical, axillary or inguinal LUNGS: clear to auscultation and percussion with normal breathing effort HEART: regular rate & rhythm and no murmurs and no lower extremity edema ABDOMEN:abdomen soft, non-tender and normal bowel sounds Musculoskeletal:no cyanosis of digits and no clubbing  PSYCH: alert & oriented x 3 with fluent speech NEURO: Slow speech secondary to aphasia  Laboratory Data: I have reviewed the data as listed Lab Results  Component Value Date   WBC 10.0 05/02/2024   HGB 11.0 (  L) 05/02/2024   HCT 36.2 (L) 05/02/2024   MCV 82.1 05/02/2024   PLT 250 05/02/2024   Recent Labs    01/27/24 1528 02/29/24 1051 03/01/24 0454 03/03/24 0909 04/15/24 1136 05/02/24 1058  NA 139 135   < > 136 136 138  K 4.8 4.8   < > 5.1 4.3 4.5  CL 106 103   < > 102 106 108  CO2 26 24   < > 27 22 22   GLUCOSE 111* 171*   < > 138* 111* 152*  BUN 25* 34*   < > 17 23 47*  CREATININE 1.91* 2.14*   < > 1.86* 1.66* 2.22*  CALCIUM  10.9* 11.0*   < > 11.2* 11.3* 10.7*  GFRNONAA 39* 34*   < > 40* 45* 32*  PROT 7.3 7.7  --   --   --  7.4  ALBUMIN  3.7 3.7  --   --   --  3.8  AST 14* 15  --   --   --  16  ALT 12 14  --   --   --  23  ALKPHOS 63 79  --   --   --  88  BILITOT 0.4 0.5  --   --   --  0.6   < > = values in this interval not displayed.    Radiographic Studies: I have personally reviewed the radiological images as listed and agreed with the findings in the report.  None to review    ASSESSMENT & PLAN:  Patient is a 63 year old male with past medical history of renal cell carcinoma s/p nephrectomy now presenting for newly diagnosed gastric adenocarcinoma   Gastric adenocarcinoma (HCC) Newly diagnosed gastric adenocarcinoma via biopsy of ulcer base on enteroscopy Unknown stage at this time  - Will refer to Dr. Brice Campi  for EUS and biopsy - Will coordinate CT scan with Dr. Carrolyn Clan ensuring hydration for kidney protection - Will send for Caris testing on the tissue specimen.  If not sufficient will workup for MMR/MSI, HER2, PDL1 and CLDN  - If results are concerning, will send for genetic analysis - Discussed with patient and his wife that based on stage, patient might require a combination of chemotherapy, surgery with or without radiation  Return to clinic 1 week after seeing Dr. Brice Campi to discuss results and further management  Iron  deficiency anemia Iron  deficiency anemia likely secondary to bleeding from stomach cancer.  Improved with iron  supplementation.  - Will repeat iron  labs today.  Will replace as needed - Continue taking iron  pills every other day.  Use MiraLAX  for constipation  Chronic kidney disease Chronic kidney disease likely secondary to only 1 functioning kidney Follows with Dr. Carrolyn Clan  - Will coordinate IV fluids with Dr. Carrolyn Clan for a CT scan - Continue to follow with Dr. Gracy Law  Renal cell carcinoma [facility]) Patient has a history of left kidney renal cell carcinoma s/p nephrectomy in 2020 Stage pT1a with no metastasis.  - Currently in remission - Unknown if patient follows with urology at this time   Orders Placed This Encounter  Procedures   CT CHEST ABDOMEN PELVIS W CONTRAST    Patient has CKD and will need fluids before and after CT scan. We will coordinate that.    Standing Status:   Future    Expected Date:   05/02/2024    Expiration Date:   05/02/2025    If indicated for the ordered procedure, I authorize the administration of contrast media  per Radiology protocol:   Yes    Does the patient have a contrast media/X-ray dye allergy?:   No    Preferred imaging location?:   [facility]    If indicated for the ordered procedure, I authorize the administration of oral contrast media per Radiology protocol:   Yes   Ferritin    Standing Status:   Future    Number  of Occurrences:   1    Expected Date:   05/02/2024    Expiration Date:   05/02/2025   Folate    Standing Status:   Future    Number of Occurrences:   1    Expected Date:   05/02/2024    Expiration Date:   05/02/2025   Vitamin B12    Standing Status:   Future    Number of Occurrences:   1    Expected Date:   05/02/2024    Expiration Date:   05/02/2025   CBC with Differential/Platelet    Standing Status:   Future    Number of Occurrences:   1    Expected Date:   05/02/2024    Expiration Date:   05/02/2025   Comprehensive metabolic panel with GFR    Standing Status:   Future    Number of Occurrences:   1    Expected Date:   05/02/2024    Expiration Date:   05/02/2025   Iron  and TIBC    Standing Status:   Future    Number of Occurrences:   1    Expected Date:   05/02/2024    Expiration Date:   05/02/2025    The total time spent in the appointment was 60 minutes encounter with patients including review of chart and various tests results, discussions about plan of care and coordination of care plan   All questions were answered. The patient knows to call the clinic with any problems, questions or concerns. No barriers to learning was detected.  Eduardo Grade, MD 6/3/202510:11 AM

## 2024-05-02 NOTE — Patient Instructions (Signed)
 VISIT SUMMARY:  Today, you were seen for the evaluation and management of your gastric cancer and other health conditions. We discussed the need for further diagnostic procedures to determine the stage of your gastric cancer and the management of your kidney function, given your history of kidney cancer. We also reviewed your iron  deficiency anemia, stroke-related deficits, and current medications.  YOUR PLAN:  -STOMACH CANCER: Stomach cancer is a type of cancer that begins in the stomach. We need to determine the stage of your cancer to decide on the best treatment options. You will be referred to Dr. Denyce Flank for a specialized ultrasound endoscopy, and we will coordinate a CT scan with Dr. Carrolyn Clan, ensuring proper hydration to protect your remaining kidney. We will also conduct mutation testing on tissue samples or obtain new samples if needed.  -IRON  DEFICIENCY ANEMIA: Iron  deficiency anemia is a condition where your body lacks enough iron  to produce healthy red blood cells, often due to bleeding. We will continue your current iron  supplementation with ferrous sulfate  every other day and order iron  studies to monitor your levels.  -CHRONIC KIDNEY DISEASE: Chronic kidney disease is a long-term condition where the kidneys do not work as well as they should. Given your history of kidney cancer and having only one kidney, we will coordinate with Dr. Carrolyn Clan to ensure you are properly hydrated before and after your CT scan to protect your kidney function.  -DEPRESSION AND ANXIETY: Depression and anxiety are mental health conditions that affect your mood and overall well-being. You are currently taking Cymbalta  for these conditions. We will monitor for any side effects from Cymbalta  and gabapentin , and discuss these with your prescribing physician if needed.  INSTRUCTIONS:  Please schedule a follow-up appointment with Dr. Monsouraty for the ultrasound endoscopy. Coordinate with Dr. Carrolyn Clan for your CT  scan and ensure you are properly hydrated before and after the procedure. Schedule follow-up labs for iron  studies. Make sure all your appointments and tests are scheduled and communicated.

## 2024-05-02 NOTE — Telephone Encounter (Signed)
 Dr. Brice Campi,  Urgent referral for EUS (records are in EPIC.  Thanks, [employer]

## 2024-05-03 ENCOUNTER — Ambulatory Visit: Payer: Self-pay | Admitting: *Deleted

## 2024-05-03 ENCOUNTER — Encounter: Payer: Self-pay | Admitting: Hematology

## 2024-05-03 DIAGNOSIS — C649 Malignant neoplasm of unspecified kidney, except renal pelvis: Secondary | ICD-10-CM | POA: Insufficient documentation

## 2024-05-03 NOTE — Assessment & Plan Note (Signed)
 Newly diagnosed gastric adenocarcinoma via biopsy of ulcer base on enteroscopy Unknown stage at this time  - Will refer to Dr. Brice Campi for EUS and biopsy - Will coordinate CT scan with Dr. Carrolyn Clan ensuring hydration for kidney protection - Will send for Caris testing on the tissue specimen.  If not sufficient will workup for MMR/MSI, HER2, PDL1 and CLDN  - If results are concerning, will send for genetic analysis - Discussed with patient and his wife that based on stage, patient might require a combination of chemotherapy, surgery with or without radiation  Return to clinic 1 week after seeing Dr. Brice Campi to discuss results and further management

## 2024-05-03 NOTE — Assessment & Plan Note (Signed)
 Iron  deficiency anemia likely secondary to bleeding from stomach cancer.  Improved with iron  supplementation.  - Will repeat iron  labs today.  Will replace as needed - Continue taking iron  pills every other day.  Use MiraLAX  for constipation

## 2024-05-03 NOTE — Telephone Encounter (Signed)
 Will review when I return to the office, by midweek. GM

## 2024-05-03 NOTE — Telephone Encounter (Signed)
 Patient notified to start taking B12 1000 mcg daily and that we will reach out to schedule another iron  infusion.  Verbalized understanding.

## 2024-05-03 NOTE — Assessment & Plan Note (Signed)
 Chronic kidney disease likely secondary to only 1 functioning kidney Follows with Dr. Carrolyn Clan  - Will coordinate IV fluids with Dr. Carrolyn Clan for a CT scan - Continue to follow with Dr. Gracy Law

## 2024-05-03 NOTE — Assessment & Plan Note (Signed)
 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  - Currently in remission - Unknown if patient follows with urology at this time

## 2024-05-03 NOTE — Telephone Encounter (Signed)
-----   Message from [employer] [facility] sent at 05/03/2024 10:14 AM EDT ----- Hi team,  Raymond Barrera-please let the patient know that he still has iron  deficiency and anemia and will need 1 more infusion.  Also ask him to start taking vitamin B12 orally every day.  Daniel-please schedule patient for 1 dose of INFeD  1 g.  Thanks, Eduardo Grade, MD Hematology/Oncology [facility] at [facility] ----- Message ----- From: Interface, Lab In [place] Sent: 05/02/2024  11:40 AM EDT To: Eduardo Grade, MD

## 2024-05-04 ENCOUNTER — Inpatient Hospital Stay: Admitting: Physician Assistant

## 2024-05-04 ENCOUNTER — Other Ambulatory Visit: Payer: Self-pay

## 2024-05-04 DIAGNOSIS — C169 Malignant neoplasm of stomach, unspecified: Secondary | ICD-10-CM

## 2024-05-04 NOTE — Telephone Encounter (Signed)
 Left message on machine to call back

## 2024-05-04 NOTE — Telephone Encounter (Signed)
 EUS has been set up for 05/12/24 at 1215 pm at [facility] with GM

## 2024-05-04 NOTE — Telephone Encounter (Signed)
 HK and MFA, I have reviewed your request for EUS. Seems reasonable, and I see that imaging is pending to be completed for final staging. Unfortunately, I have no availability for upper endoscopic ultrasound for gastric cancer staging at this time. If I have a cancellation I can be happy to try to assist but I am booked through the majority of July. I am placing Dr. Nickey Barn and Dr. Kimble Pennant on here from the GI groups in [place] who can offer endoscopic ultrasound as well, pending their availability. Unfortunately, again I am sorry I cannot help as I normally would. I will have my office close this referral for now. GM  PH and WO, Can you try to help with gastric cancer staging for this patient via EUS? GM

## 2024-05-04 NOTE — Telephone Encounter (Signed)
 Team, I now have an opening for next week for EUS staging. I'll have my team work on scheduling for next week. Please update us  based on the results of the CT scans ASAP. Thanks. GM

## 2024-05-05 ENCOUNTER — Encounter (HOSPITAL_COMMUNITY): Payer: Self-pay | Admitting: Gastroenterology

## 2024-05-05 ENCOUNTER — Telehealth: Payer: Self-pay

## 2024-05-05 ENCOUNTER — Other Ambulatory Visit: Payer: Self-pay | Admitting: Family Medicine

## 2024-05-05 NOTE — Telephone Encounter (Signed)
EUS scheduled, pt instructed and medications reviewed.  Patient instructions mailed to home and My Chart .  Patient to call with any questions or concerns.  

## 2024-05-05 NOTE — Telephone Encounter (Signed)
 Per Anesthesia review, TTE is needed to be done to see where things stand from Cardiac perspective. This has been moved up. Hopefully, will allow us  to keep patient's current scheduled date and location. I do not have [facility] availability as outpatient. If this needs to be done at [facility], then patient will need to be cancelled and then be sent to outside facility for EUS staging if necessary. GM

## 2024-05-05 NOTE — Telephone Encounter (Addendum)
 Patient needs appointment for the echo moved up due to upcoming procedure Holding slot on the 10 th getting in contact with both transport companies to make sure he can make it to the testing.

## 2024-05-06 ENCOUNTER — Encounter (INDEPENDENT_AMBULATORY_CARE_PROVIDER_SITE_OTHER): Payer: Self-pay | Admitting: *Deleted

## 2024-05-06 ENCOUNTER — Encounter: Payer: Self-pay | Admitting: Hematology

## 2024-05-06 NOTE — Telephone Encounter (Signed)
   Patient Name: Raymond Barrera.  DOB: Aug 12, 1961 MRN: [id number]  Primary Cardiologist: Luana Rumple, MD  Chart reviewed as part of pre-operative protocol coverage.  Patient is currently pending procedure on 05/12/2024, anesthesiology needs echocardiogram to be scheduled prior to procedure.  Echocardiogram on 05/09/2024 at 9:00 am, cannot be rescheduled to a later date as this would not allow for evaluation prior to procedure.   Murry Diaz D Glenola Wheat, NP 05/06/2024, 8:38 AM

## 2024-05-06 NOTE — Telephone Encounter (Signed)
 Called transportation company and talked to [employer], to make sure patient can be transported to their appointment on the 9th. Faxed paperwork needed for the change to transport at fax number [phone] and addended paperwork to correct time as well at later time. Called and confirmed with company patient pickup time will be 12:30 on the 9th.  Left Massage on patient answer machine and also sent appointment reminder to let patient know the info.

## 2024-05-09 ENCOUNTER — Ambulatory Visit: Attending: Cardiology

## 2024-05-09 ENCOUNTER — Telehealth: Payer: Self-pay

## 2024-05-09 DIAGNOSIS — R0602 Shortness of breath: Secondary | ICD-10-CM

## 2024-05-09 MED FILL — Iron Dextran Inj 50 MG/ML (Elemental Iron): INTRAMUSCULAR | Qty: 1 | Status: AC

## 2024-05-09 NOTE — Telephone Encounter (Signed)
-----   Message from Nurse Jameia Makris P sent at 05/04/2024 12:34 PM EDT ----- Make sure to have plavix  hold for 6/12 case

## 2024-05-09 NOTE — Telephone Encounter (Signed)
 Dr Brice Campi the pt has not held his plavix . Last dose was this morning.  He is having an echo today.  Ok to push out to August 14 at 9 am?

## 2024-05-09 NOTE — Telephone Encounter (Signed)
 Left message on machine to call back

## 2024-05-09 NOTE — Telephone Encounter (Signed)
 From a diagnostic perspective, if we are not going to do overt sampling, then I can do this procedure on Plavix . If we have a few days off, it will be OK as well. Thanks. GM

## 2024-05-09 NOTE — Telephone Encounter (Signed)
 We can still perform procedure for staging. If there are abnormal appearing LNs, then we will clinically/EUS stage them but not be able to sample.  I think this is OK. His CT scans are also pending completion as well. August 14 is too far out otherwise in regards to this procedure. If the Oncology, team is OK with this plan of action, we will pursue the EUS on Thursday still. If they feel not ideal, then we will have to cancel, and likely he will need to be referred elsewhere. If his TTE is abnormal, then we may not have a choice but to cancel, but I think again, reasonable to maintain the date currently.  HK, are you OK with the above for us  to move forward, even if I cannot sample LNs if they are seen.

## 2024-05-09 NOTE — Telephone Encounter (Signed)
-----   Message from Meldon Sport sent at 05/08/2024 10:50 PM EDT ----- Ortencia Blamer to hold Plavix  for 5 days prior to the procedure. ----- Message ----- From: Aneita Keens, RN Sent: 05/04/2024  12:19 PM EDT To: Meldon Sport, MD

## 2024-05-10 ENCOUNTER — Encounter (HOSPITAL_COMMUNITY): Payer: Self-pay | Admitting: Physician Assistant

## 2024-05-10 ENCOUNTER — Ambulatory Visit: Payer: Self-pay | Admitting: Cardiology

## 2024-05-10 ENCOUNTER — Inpatient Hospital Stay

## 2024-05-10 VITALS — BP 169/97 | HR 70 | Temp 97.2°F | Resp 18

## 2024-05-10 DIAGNOSIS — N189 Chronic kidney disease, unspecified: Secondary | ICD-10-CM | POA: Diagnosis not present

## 2024-05-10 DIAGNOSIS — D500 Iron deficiency anemia secondary to blood loss (chronic): Secondary | ICD-10-CM

## 2024-05-10 DIAGNOSIS — Z905 Acquired absence of kidney: Secondary | ICD-10-CM | POA: Diagnosis not present

## 2024-05-10 DIAGNOSIS — Z8553 Personal history of malignant neoplasm of renal pelvis: Secondary | ICD-10-CM | POA: Diagnosis not present

## 2024-05-10 DIAGNOSIS — Z720 Tobacco use: Secondary | ICD-10-CM | POA: Diagnosis not present

## 2024-05-10 DIAGNOSIS — Z8673 Personal history of transient ischemic attack (TIA), and cerebral infarction without residual deficits: Secondary | ICD-10-CM | POA: Diagnosis not present

## 2024-05-10 DIAGNOSIS — C169 Malignant neoplasm of stomach, unspecified: Secondary | ICD-10-CM | POA: Diagnosis not present

## 2024-05-10 DIAGNOSIS — D509 Iron deficiency anemia, unspecified: Secondary | ICD-10-CM | POA: Diagnosis not present

## 2024-05-10 LAB — ECHOCARDIOGRAM COMPLETE
AR max vel: 2.75 cm2
AV Area VTI: 2.72 cm2
AV Area mean vel: 2.57 cm2
AV Mean grad: 5 mmHg
AV Peak grad: 8.7 mmHg
Ao pk vel: 1.48 m/s
Area-P 1/2: 3.6 cm2
Calc EF: 32.2 %
S' Lateral: 4.8 cm
Single Plane A2C EF: 32.9 %
Single Plane A4C EF: 31.9 %

## 2024-05-10 MED ORDER — FAMOTIDINE IN NACL 20-0.9 MG/50ML-% IV SOLN
20.0000 mg | Freq: Once | INTRAVENOUS | Status: AC
  Administered 2024-05-10: 20 mg via INTRAVENOUS
  Filled 2024-05-10: qty 50

## 2024-05-10 MED ORDER — METHYLPREDNISOLONE SODIUM SUCC 125 MG IJ SOLR
125.0000 mg | Freq: Once | INTRAMUSCULAR | Status: AC
  Administered 2024-05-10: 125 mg via INTRAVENOUS
  Filled 2024-05-10: qty 2

## 2024-05-10 MED ORDER — CETIRIZINE HCL 10 MG/ML IV SOLN
10.0000 mg | Freq: Once | INTRAVENOUS | Status: AC
  Administered 2024-05-10: 10 mg via INTRAVENOUS
  Filled 2024-05-10: qty 1

## 2024-05-10 MED ORDER — SODIUM CHLORIDE 0.9 % IV SOLN
1000.0000 mg | Freq: Once | INTRAVENOUS | Status: AC
  Administered 2024-05-10: 1000 mg via INTRAVENOUS
  Filled 2024-05-10: qty 20

## 2024-05-10 MED ORDER — SODIUM CHLORIDE 0.9 % IV SOLN: Freq: Once | INTRAVENOUS | Status: AC

## 2024-05-10 MED ORDER — ACETAMINOPHEN 325 MG PO TABS
650.0000 mg | ORAL_TABLET | Freq: Once | ORAL | Status: AC
  Administered 2024-05-10: 650 mg via ORAL
  Filled 2024-05-10: qty 2

## 2024-05-10 MED ORDER — SODIUM CHLORIDE 0.9 % IV SOLN
950.0000 mg | Freq: Once | INTRAVENOUS | Status: DC
  Filled 2024-05-10: qty 19

## 2024-05-10 NOTE — Telephone Encounter (Signed)
-----   Message from Katlyn D [place] sent at 05/10/2024  4:16 PM EDT ----- Please let [name] know that his echocardiogram shows reduced ejection fraction at 30 to 35%, stable compared to echocardiogram from 1 year ago.  There is mild stiffening and thickening of the left lower chamber of the heart, will need to continue to work on good blood pressure control.  There is mild dilation of the ascending aorta, similar compared to prior echocardiogram.  Will continue to monitor with future echocardiograms.

## 2024-05-10 NOTE — Progress Notes (Signed)
 Patient tolerated iron infusion with no complaints voiced.  Peripheral IV site clean and dry with good blood return noted before and after infusion.  Band aid applied.  VSS with discharge and left in satisfactory condition with no s/s of distress noted.

## 2024-05-10 NOTE — Telephone Encounter (Signed)
 Left message to call back

## 2024-05-10 NOTE — Patient Instructions (Signed)
 [facility] [place] - A DEPT OF [facility]  Discharge Instructions: Thank you for choosing [facility] to provide your oncology and hematology care.  If you have a lab appointment with the Cancer Center - please note that after April 8th, 2024, all labs will be drawn in the cancer center.  You do not have to check in or register with the main entrance as you have in the past but will complete your check-in in the cancer center.  Wear comfortable clothing and clothing appropriate for easy access to any Portacath or PICC line.   We strive to give you quality time with your provider. You may need to reschedule your appointment if you arrive late (15 or more minutes).  Arriving late affects you and other patients whose appointments are after yours.  Also, if you miss three or more appointments without notifying the office, you may be dismissed from the clinic at the provider's discretion.      For prescription refill requests, have your pharmacy contact our office and allow 72 hours for refills to be completed.    Today you received the following Infed, return as scheduled.   To help prevent nausea and vomiting after your treatment, we encourage you to take your nausea medication as directed.  BELOW ARE SYMPTOMS THAT SHOULD BE REPORTED IMMEDIATELY: *FEVER GREATER THAN 100.4 F (38 C) OR HIGHER *CHILLS OR SWEATING *NAUSEA AND VOMITING THAT IS NOT CONTROLLED WITH YOUR NAUSEA MEDICATION *UNUSUAL SHORTNESS OF BREATH *UNUSUAL BRUISING OR BLEEDING *URINARY PROBLEMS (pain or burning when urinating, or frequent urination) *BOWEL PROBLEMS (unusual diarrhea, constipation, pain near the anus) TENDERNESS IN MOUTH AND THROAT WITH OR WITHOUT PRESENCE OF ULCERS (sore throat, sores in mouth, or a toothache) UNUSUAL RASH, SWELLING OR PAIN  UNUSUAL VAGINAL DISCHARGE OR ITCHING   Items with * indicate a potential emergency and should be followed up as soon as possible or go  to the Emergency Department if any problems should occur.  Please show the CHEMOTHERAPY ALERT CARD or IMMUNOTHERAPY ALERT CARD at check-in to the Emergency Department and triage nurse.  Should you have questions after your visit or need to cancel or reschedule your appointment, please contact [facility] [place] - A DEPT OF [facility] [phone]  and follow the prompts.  Office hours are 8:00 a.m. to 4:30 p.m. Monday - Friday. Please note that voicemails left after 4:00 p.m. may not be returned until the following business day.  We are closed weekends and major holidays. You have access to a nurse at all times for urgent questions. Please call the main number to the clinic [phone] and follow the prompts.  For any non-urgent questions, you may also contact your provider using MyChart. We now offer e-Visits for anyone 16 and older to request care online for non-urgent symptoms. For details visit [email or web address].   Also download the MyChart app! Go to the app store, search "MyChart", open the app, select , and log in with your MyChart username and password.

## 2024-05-10 NOTE — Telephone Encounter (Signed)
 Dr Brice Campi per Laban Pia anesthesia pt is ok to proceed with procedure (see note regarding echo)  We have not gotten a response back about plavix .  Do you want him to keep appt as planned?    Left message on machine to call back

## 2024-05-11 ENCOUNTER — Telehealth (HOSPITAL_COMMUNITY): Payer: Self-pay

## 2024-05-11 DIAGNOSIS — C49A2 Gastrointestinal stromal tumor of stomach: Secondary | ICD-10-CM | POA: Diagnosis not present

## 2024-05-11 NOTE — Progress Notes (Signed)
 Attempted to obtain medical history for pre op call via telephone, unable to reach at this time. HIPAA compliant voicemail message left requesting return call to pre surgical testing department.

## 2024-05-11 NOTE — Telephone Encounter (Signed)
 Dr Brice Campi see the note from the pt. He is not able to keep procedure appt on 6/12.  Please advise timing of reschedule

## 2024-05-11 NOTE — Telephone Encounter (Signed)
 Left message to call back

## 2024-05-11 NOTE — Telephone Encounter (Signed)
 [name], I am sorry to hear this. At this point we will not be able to accommodate the EUS. Will forward this to the patient's Oncology and GI team. If EUS is required they will need to ask Dr. Kimble Pennant or Dr. Nickey Barn or reach out to [facility] facility, since our next available procedure is not until August unless other cancellations occur. GM  FYI HK and MFA.

## 2024-05-11 NOTE — Telephone Encounter (Signed)
 Raymond Barrera. was scheduled for EUS with Dr. Brice Campi on 6/12 (date), at [facility].   Provider called on 6/11 (date) to cancel their procedure

## 2024-05-12 ENCOUNTER — Encounter (HOSPITAL_COMMUNITY): Admission: RE | Payer: Self-pay | Source: Home / Self Care

## 2024-05-12 ENCOUNTER — Ambulatory Visit (HOSPITAL_COMMUNITY): Admission: RE | Admit: 2024-05-12 | Source: Home / Self Care | Admitting: Gastroenterology

## 2024-05-12 ENCOUNTER — Other Ambulatory Visit: Payer: Self-pay | Admitting: Oncology

## 2024-05-12 DIAGNOSIS — C169 Malignant neoplasm of stomach, unspecified: Secondary | ICD-10-CM

## 2024-05-12 SURGERY — ULTRASOUND, UPPER GI TRACT, ENDOSCOPIC
Anesthesia: Monitor Anesthesia Care

## 2024-05-12 NOTE — Telephone Encounter (Signed)
 Separate note documented his inability to come in for urgently scheduled EUS. Thanks. GM

## 2024-05-14 ENCOUNTER — Ambulatory Visit (HOSPITAL_BASED_OUTPATIENT_CLINIC_OR_DEPARTMENT_OTHER)

## 2024-05-17 ENCOUNTER — Other Ambulatory Visit

## 2024-05-18 ENCOUNTER — Encounter (INDEPENDENT_AMBULATORY_CARE_PROVIDER_SITE_OTHER): Payer: Self-pay | Admitting: Gastroenterology

## 2024-05-18 ENCOUNTER — Ambulatory Visit (INDEPENDENT_AMBULATORY_CARE_PROVIDER_SITE_OTHER): Admitting: Gastroenterology

## 2024-05-18 VITALS — BP 137/96 | HR 75 | Temp 97.5°F | Ht 78.0 in | Wt 236.2 lb

## 2024-05-18 DIAGNOSIS — Z860100 Personal history of colon polyps, unspecified: Secondary | ICD-10-CM

## 2024-05-18 DIAGNOSIS — D509 Iron deficiency anemia, unspecified: Secondary | ICD-10-CM

## 2024-05-18 DIAGNOSIS — Z860101 Personal history of adenomatous and serrated colon polyps: Secondary | ICD-10-CM | POA: Diagnosis not present

## 2024-05-18 DIAGNOSIS — F1091 Alcohol use, unspecified, in remission: Secondary | ICD-10-CM | POA: Diagnosis not present

## 2024-05-18 DIAGNOSIS — D500 Iron deficiency anemia secondary to blood loss (chronic): Secondary | ICD-10-CM

## 2024-05-18 DIAGNOSIS — C169 Malignant neoplasm of stomach, unspecified: Secondary | ICD-10-CM | POA: Diagnosis not present

## 2024-05-18 NOTE — Progress Notes (Signed)
 Forbes Loll Faizan Allysha Tryon , M.D. Gastroenterology & Hepatology [facility] [address] [place], [place] [address] Primary Care Physician: Meldon Sport, MD [address] [place] [address]  Chief Complaint:  Iron  deficiency anemia Jeb Miner adenocarcinoma  History of Present Illness: Raymond Barrera. is a 63 y.o. male  with  history of CVA on ASA/Plavix , hypertension, COPD, diabetes, CKD, RCC status post right nephrectomy who presents for evaluation of Iron  deficiency anemia in setting of gastric adenocarcinoma.  Patient had  hospitalization for symptomatic anemia of hemoglobin 6.9.  He underwent bidirectional endoscopy without any overt bleeding found 3 TA which were removed.  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  Thereafter he underwent push enteroscopy on 03/2024 which demonstrated gastric ulcer consistent with gastric adenocarcinoma.  Patient had seen him oncology since then and was scheduled for EUS to stage gastric cancer but apparently missed appointment for EUS  Last EGD:03/2024  - Normal esophagus. - Non- bleeding gastric ulcer with a clean ulcer base ( Forrest Class III) . Biopsied. Clip ( MR conditional) was placed. Clip manufacturer: [employer]. - Normal examined duodenum. - The examined portion of the jejunum was normal. - The examined portion of the jejunum was normal. Tattooed. - No angioectasia seen on capsule endoscopy seen during the push enteroscopy despite multiple looks  A. STOMACH, ABNORMAL MUCOSA BIOPSY:  Invasive moderately differentiated adenocarcinoma  Reactive gastropathy with intestinal metaplasia showing focal dysplasia  Negative for H. pylori   03/2024  - 1 cm hiatal hernia. - Erosive gastropathy with no stigmata of recent bleeding. Biopsied. - Normal examined duodenum. Biopsied.   Last Colonoscopy:03/2024  - Preparation of the colon was fair. -  Three 3 to 5 mm polyps in the transverse colon, removed with a cold snare. Resected and retrieved. Clip manufacturer: [employer]. Clip ( MR safe) was placed.  Internal hemorrhoids  3 TA's  - Daily H/ H. - Repeat colonoscopy in 1 year because the bowel preparation was suboptimal. - Avoid Cocaine and NSAID use. - Keep on ASA 81 mg for next 5 days, then restart Plavix  if stable.  Capsule endoscopy;   Study complete, prep was good. Small bowel capsule reached the colon during study.  No active bleeding or old blood noted.  He had several small nonbleeding small bowel angiectasias (28 minutes 53 seconds, 32 minutes 50 seconds, 34 minutes 59 seconds, 36 minutes 1 second, 38 minutes 11 seconds, 40 minutes 18 seconds). Couple of benign lymphangiectasia.   Past Medical History: Past Medical History:  Diagnosis Date   Ambulates with cane    straight - uses occasionally   Anxiety    due to the stroke   Arthritis    Back pain    hx of buldging disc   Complication of anesthesia    took a while for him to wake up after previous anesthesia   CVA (cerebral vascular accident) (HCC) 10/07/2020   Diabetes mellitus without complication (HCC)    Family history of adverse reaction to anesthesia    sometimes mom has a hard time waking up   High cholesterol    takes Zocor  daily   Hypertension    takes Benazepril  and HCTZ  daily   Joint pain    Joint swelling    Memory impairment    occassional - from stroke   Myocardial infarction (HCC) 1987   Pneumonia    hx of-80's   Shortness of breath dyspnea  do to pain   Sleep apnea    never had a sleep study,but states Dr. Colby Daub says he has it   Slurred speech    Stroke (HCC) 08/2013   7 mini-strokes, last stroke 2017   TIA (transient ischemic attack) 2014   x 7    Urinary frequency     Past Surgical History: Past Surgical History:  Procedure Laterality Date   ABDOMINAL EXPOSURE N/A 06/16/2018   Procedure: ABDOMINAL EXPOSURE;   Surgeon: Mayo Speck, MD;  Location: [facility] OR;  Service: Vascular;  Laterality: N/A;   ANKLE SURGERY  2008   left ankle-otif-[facility]   ANTERIOR LUMBAR FUSION Bilateral 06/16/2018   Procedure: LUMBAR 4-5 LUMBAR 5-SACRUM 1 ANTERIOR LUMBAR INTERBODY FUSION WITH INSTRUMENTATION AND ALLOGRAFT;  Surgeon: Virl Grimes, MD;  Location: [facility] OR;  Service: Orthopedics;  Laterality: Bilateral;   BACK SURGERY     COLONOSCOPY N/A 03/01/2024   Procedure: COLONOSCOPY;  Surgeon: Urban Garden, MD;  [facility]: [facility]ITE;  Service: Gastroenterology;  Laterality: N/A;   ENTEROSCOPY N/A 04/19/2024   Procedure: ENTEROSCOPY;  Surgeon: Hargis Lias, MD;  Location: [facility];  Service: Endoscopy;  Laterality: N/A;  9:0AM;ASA 3   ESOPHAGOGASTRODUODENOSCOPY N/A 03/01/2024   Procedure: EGD (ESOPHAGOGASTRODUODENOSCOPY);  Surgeon: Umberto Ganong, Bearl Limes, MD;  Location: [facility];  Service: Gastroenterology;  Laterality: N/A;   GIVENS CAPSULE STUDY N/A 03/02/2024   Procedure: IMAGING PROCEDURE, GI TRACT, INTRALUMINAL, VIA CAPSULE;  Surgeon: Vinetta Greening, DO;  Location: [facility];  Service: Endoscopy;  Laterality: N/A;   HEMATOMA EVACUATION Left 08/13/2018   Procedure: EVACUATION HEMATOMA LEFT ABDOMINAL WALL;  Surgeon: Mayo Speck, MD;  Location: [facility] OR;  Service: Vascular;  Laterality: Left;   HEMOSTASIS CLIP PLACEMENT  03/01/2024   Procedure: CONTROL OF HEMORRHAGE, GI TRACT, ENDOSCOPIC, BY CLIPPING OR OVERSEWING;  Surgeon: Urban Garden, MD;  Location: [facility];  Service: Gastroenterology;;   IR RADIOLOGIST EVAL & MGMT  07/27/2018   IR US  GUIDE BX ASP/DRAIN  07/14/2018   JOINT REPLACEMENT     both hips replaced    LUMBAR LAMINECTOMY/DECOMPRESSION MICRODISCECTOMY Right 10/17/2014   Procedure: LUMBAR LAMINECTOMY/DECOMPRESSION MICRODISCECTOMY 2 LEVELS;  Surgeon: Augusto Blonder, MD;  Location: [facility] NEURO ORS;  Service: Neurosurgery;  Laterality: Right;  Right L45 L5S1 laminectomy and  foraminotomy   MASS EXCISION  09/13/2012   Procedure: EXCISION MASS;  Surgeon: Beau Bound, MD;  Location: [facility] ORS;  Service: General;  Laterality: N/A;   POLYPECTOMY  03/01/2024   Procedure: POLYPECTOMY;  Surgeon: Urban Garden, MD;  [facility]: [facility];  Service: Gastroenterology;;   ROBOTIC ASSITED PARTIAL NEPHRECTOMY Left 03/30/2019   Procedure: XI ROBOTIC ASSITED PARTIAL NEPHRECTOMY POSSIBLE RADICAL NEPHRECTOMY;  Surgeon: Adelbert Homans, MD;  Location: [facility] ORS;  Service: Urology;  Laterality: Left;   SHOULDER ARTHROSCOPY WITH ROTATOR CUFF REPAIR Right 04/12/2020   Procedure: right shoulder arthroscopy, debridement, mini open rotator cuff tear repair;  Surgeon: Jasmine Mesi, MD;  Location[facility] OR;  Service: Orthopedics;  Laterality: Right;   TONSILLECTOMY     TOTAL HIP ARTHROPLASTY Right 03/20/2015   TOTAL HIP ARTHROPLASTY Right 03/20/2015   Procedure: TOTAL HIP ARTHROPLASTY ANTERIOR APPROACH;  Surgeon: Saundra Curl, MD;  Location: [facility] OR;  Service: Orthopedics;  Laterality: Right;   TOTAL HIP ARTHROPLASTY Left 01/08/2016   Procedure: TOTAL HIP ARTHROPLASTY ANTERIOR APPROACH;  Surgeon: Saundra Curl, MD;  Location: [facility] OR;  Service: Orthopedics;  Laterality: Left;  Family History: Family History  Problem Relation Age of Onset   Heart disease Father    Colon cancer Neg Hx    Colon polyps Neg Hx     Social History: Social History   Tobacco Use  Smoking Status Some Days   Current packs/day: 0.25   Average packs/day: 0.3 packs/day for 47.0 years (11.8 ttl pk-yrs)   Types: Cigarettes   Passive exposure: Current  Smokeless Tobacco Never  Tobacco Comments   04/22/2024 Patient smokes 2 cigarettes daily   Currently smokes about 2 cigarettes per day.  07/14/2023 hfb   1 a day as of 04/29/2024   Social History   Substance and Sexual Activity  Alcohol Use Not Currently   Comment: quit 2012   Social History   Substance and Sexual Activity  Drug  Use Not Currently   Types: Cocaine   Comment: many yrs ago., last time- late 2016    Allergies: Allergies  Allergen Reactions   Poison Ivy Extract [Poison Ivy Extract] Itching and Rash   Coreg  [Carvedilol ] Itching    Itching and dizziness    Medications: Current Outpatient Medications  Medication Sig Dispense Refill   Accu-Chek Softclix Lancets lancets SMARTSIG:Topical 2-3 Times Daily     acetaminophen  (TYLENOL ) 325 MG tablet Take 2 tablets (650 mg total) by mouth every 6 (six) hours as needed for mild pain (pain score 1-3) (or Fever >/= 101).     albuterol  (VENTOLIN  HFA) 108 (90 Base) MCG/ACT inhaler Inhale 2 puffs into the lungs every 6 (six) hours as needed for wheezing or shortness of breath (cough). 8 g 2   amLODipine  (NORVASC ) 2.5 MG tablet Take 1 tablet (2.5 mg total) by mouth daily. 90 tablet 3   aspirin  EC 81 MG tablet Take 1 tablet (81 mg total) by mouth daily with breakfast. Swallow whole. 30 tablet 12   atorvastatin  (LIPITOR) 40 MG tablet Take 1 tablet (40 mg total) by mouth daily. 90 tablet 3   Camphor-Menthol -Methyl Sal (SALONPAS EX) Apply topically. As needed     clopidogrel  (PLAVIX ) 75 MG tablet Take 1 tablet (75 mg total) by mouth daily. 30 tablet 11   cyanocobalamin (VITAMIN B12) 1000 MCG tablet Take 1,000 mcg by mouth daily.     DULoxetine  (CYMBALTA ) 30 MG capsule Take 1 capsule (30 mg total) by mouth 2 (two) times daily. 60 capsule 3   ferrous sulfate  325 (65 FE) MG EC tablet Take 1 tablet (325 mg total) by mouth every Tuesday, Thursday, Saturday, and Sunday. 45 tablet 3   gabapentin  (NEURONTIN ) 300 MG capsule Take 1 capsule (300 mg total) by mouth at bedtime. 90 capsule 1   JARDIANCE  10 MG TABS tablet Take 1 tablet (10 mg total) by mouth daily. 30 tablet 3   pantoprazole  (PROTONIX ) 40 MG tablet Take 1 tablet (40 mg total) by mouth 2 (two) times daily. 60 tablet 2   polyethylene glycol powder (GLYCOLAX /MIRALAX ) 17 GM/SCOOP powder Take 8.5 g by mouth daily.      rOPINIRole  (REQUIP ) 2 MG tablet Take 1 tablet (2 mg total) by mouth at bedtime. 30 tablet 1   umeclidinium-vilanterol (ANORO ELLIPTA ) 62.5-25 MCG/ACT AEPB Inhale 1 puff into the lungs daily. 60 each 3   No current facility-administered medications for this visit.    Review of Systems: GENERAL: negative for malaise, night sweats HEENT: No changes in hearing or vision, no nose bleeds or other nasal problems. NECK: Negative for lumps, goiter, pain and significant neck swelling RESPIRATORY: Negative for cough, wheezing  CARDIOVASCULAR: Negative for chest pain, leg swelling, palpitations, orthopnea GI: SEE HPI MUSCULOSKELETAL: Negative for joint pain or swelling, back pain, and muscle pain. SKIN: Negative for lesions, rash HEMATOLOGY Negative for prolonged bleeding, bruising easily, and swollen nodes. ENDOCRINE: Negative for cold or heat intolerance, polyuria, polydipsia and goiter. NEURO: negative for tremor, gait imbalance, syncope and seizures. The remainder of the review of systems is noncontributory.   Physical Exam: BP (!) 137/96 (BP Location: Left Arm, Patient Position: Sitting, Cuff Size: Normal)   Pulse 75   Temp (!) 97.5 F (36.4 C) (Temporal)   Ht 6' 6 (1.981 m)   Wt 236 lb 3.2 oz (107.1 kg)   BMI 27.30 kg/m  GENERAL: The patient is AO x3, in no acute distress. HEENT: Head is normocephalic and atraumatic. EOMI are intact. Mouth is well hydrated and without lesions. NECK: Supple. No masses LUNGS: Clear to auscultation. No presence of rhonchi/wheezing/rales. Adequate chest expansion HEART: RRR, normal s1 and s2. ABDOMEN: Soft, nontender, no guarding, no peritoneal signs, and nondistended. BS +. No masses.  Imaging/Labs: as above     Latest Ref Rng & Units 05/02/2024   10:58 AM 04/15/2024   11:36 AM 03/15/2024   10:40 AM  CBC  WBC 4.0 - 10.5 K/uL 10.0  5.2  7.0   Hemoglobin 13.0 - 17.0 g/dL 16.1  09.6  8.0   Hematocrit 39.0 - 52.0 % 36.2  34.0  30.1   Platelets 150 -  400 K/uL 250  288  230    Lab Results  Component Value Date   IRON  77 05/02/2024   TIBC 414 05/02/2024   FERRITIN 14 (L) 05/02/2024    I personally reviewed and interpreted the available labs, imaging and endoscopic files.  Impression and Plan:  Kiko Ripp. is a 63 y.o. male  with  history of CVA on ASA/Plavix , hypertension, COPD, diabetes, CKD, RCC status post right nephrectomy who presents for evaluation of Iron  deficiency anemia in setting of gastric adenocarcinoma.  # Gastric adenocarcinoma # Iron  deficiency anemia  Patient had missed his EUS appointment with Dr. Brice Campi.  I spoke with patient oncologist Dr. Orvis Blare , who has arranged a new referral for EUS with Dr. Kimble Pennant and he is also scheduled for PET/CT tomorrow  Patient with gastric ulcer s/p biopsies with Invasive moderately differentiated adenocarcinoma  Advised patient to proceed with PET/CT tomorrow and continue to follow-up with oncology.   #HCM colonoscopy with suboptimal prep, 3 Ta's Repeat 03/2025 If medically fit  All questions were answered.      Chidi Shirer Faizan Elmon Shader, MD Gastroenterology and Hepatology [facility]   This chart has been completed using [facility] Dictation software, and while attempts have been made to ensure accuracy , certain words and phrases may not be transcribed as intended

## 2024-05-18 NOTE — Patient Instructions (Signed)
 It was very nice to meet you today, as dicussed with will plan for the following :  1) PET CT tomorrow 2) continue to follow up with oncology

## 2024-05-19 ENCOUNTER — Encounter (HOSPITAL_COMMUNITY)
Admission: RE | Admit: 2024-05-19 | Discharge: 2024-05-19 | Disposition: A | Discharge status: Home / Self Care | Source: Ambulatory Visit | Attending: Oncology | Admitting: Oncology

## 2024-05-19 ENCOUNTER — Ambulatory Visit (HOSPITAL_COMMUNITY)

## 2024-05-19 ENCOUNTER — Inpatient Hospital Stay

## 2024-05-19 ENCOUNTER — Ambulatory Visit (INDEPENDENT_AMBULATORY_CARE_PROVIDER_SITE_OTHER): Admitting: Gastroenterology

## 2024-05-19 DIAGNOSIS — C169 Malignant neoplasm of stomach, unspecified: Secondary | ICD-10-CM | POA: Diagnosis not present

## 2024-05-19 MED ORDER — FLUDEOXYGLUCOSE F - 18 (FDG) INJECTION
12.2500 | Freq: Once | INTRAVENOUS | Status: AC | PRN
  Administered 2024-05-19: 12.25 via INTRAVENOUS

## 2024-05-23 NOTE — Telephone Encounter (Signed)
-----   Message from Katlyn D [place] sent at 05/10/2024  4:16 PM EDT ----- Please let Raymond Barrera know that his echocardiogram shows reduced ejection fraction at 30 to 35%, stable compared to echocardiogram from 1 year ago.  There is mild stiffening and thickening of the left lower chamber of the heart, will need to continue to work on good blood pressure control.  There is mild dilation of the ascending aorta, similar compared to prior echocardiogram.  Will continue to monitor with future echocardiograms.

## 2024-05-23 NOTE — Telephone Encounter (Signed)
 Left message to call back

## 2024-05-25 ENCOUNTER — Encounter: Payer: Self-pay | Admitting: Oncology

## 2024-05-26 ENCOUNTER — Ambulatory Visit: Admitting: Oncology

## 2024-05-26 NOTE — Telephone Encounter (Signed)
 Left message to call back

## 2024-05-26 NOTE — Telephone Encounter (Signed)
-----   Message from Katlyn D [place] sent at 05/10/2024  4:16 PM EDT ----- Please let Raymond Barrera know that his echocardiogram shows reduced ejection fraction at 30 to 35%, stable compared to echocardiogram from 1 year ago.  There is mild stiffening and thickening of the left lower chamber of the heart, will need to continue to work on good blood pressure control.  There is mild dilation of the ascending aorta, similar compared to prior echocardiogram.  Will continue to monitor with future echocardiograms.

## 2024-05-27 ENCOUNTER — Inpatient Hospital Stay

## 2024-05-27 ENCOUNTER — Inpatient Hospital Stay (HOSPITAL_BASED_OUTPATIENT_CLINIC_OR_DEPARTMENT_OTHER): Admitting: Oncology

## 2024-05-27 VITALS — BP 131/102 | HR 101 | Temp 98.3°F | Resp 19 | Wt 233.4 lb

## 2024-05-27 DIAGNOSIS — R220 Localized swelling, mass and lump, head: Secondary | ICD-10-CM | POA: Insufficient documentation

## 2024-05-27 DIAGNOSIS — R221 Localized swelling, mass and lump, neck: Secondary | ICD-10-CM | POA: Diagnosis not present

## 2024-05-27 DIAGNOSIS — D500 Iron deficiency anemia secondary to blood loss (chronic): Secondary | ICD-10-CM

## 2024-05-27 DIAGNOSIS — C169 Malignant neoplasm of stomach, unspecified: Secondary | ICD-10-CM | POA: Diagnosis not present

## 2024-05-27 DIAGNOSIS — Z8673 Personal history of transient ischemic attack (TIA), and cerebral infarction without residual deficits: Secondary | ICD-10-CM | POA: Diagnosis not present

## 2024-05-27 DIAGNOSIS — N189 Chronic kidney disease, unspecified: Secondary | ICD-10-CM | POA: Diagnosis not present

## 2024-05-27 DIAGNOSIS — Z720 Tobacco use: Secondary | ICD-10-CM | POA: Diagnosis not present

## 2024-05-27 DIAGNOSIS — C641 Malignant neoplasm of right kidney, except renal pelvis: Secondary | ICD-10-CM | POA: Diagnosis not present

## 2024-05-27 DIAGNOSIS — D509 Iron deficiency anemia, unspecified: Secondary | ICD-10-CM | POA: Diagnosis not present

## 2024-05-27 DIAGNOSIS — Z8553 Personal history of malignant neoplasm of renal pelvis: Secondary | ICD-10-CM | POA: Diagnosis not present

## 2024-05-27 DIAGNOSIS — Z905 Acquired absence of kidney: Secondary | ICD-10-CM | POA: Diagnosis not present

## 2024-05-27 LAB — COMPREHENSIVE METABOLIC PANEL WITH GFR
ALT: 32 U/L (ref 0–44)
AST: 20 U/L (ref 15–41)
Albumin: 3.9 g/dL (ref 3.5–5.0)
Alkaline Phosphatase: 92 U/L (ref 38–126)
Anion gap: 11 (ref 5–15)
BUN: 27 mg/dL — ABNORMAL HIGH (ref 8–23)
CO2: 22 mmol/L (ref 22–32)
Calcium: 11.3 mg/dL — ABNORMAL HIGH (ref 8.9–10.3)
Chloride: 104 mmol/L (ref 98–111)
Creatinine, Ser: 1.88 mg/dL — ABNORMAL HIGH (ref 0.61–1.24)
GFR, Estimated: 39 mL/min — ABNORMAL LOW (ref >60)
Glucose, Bld: 120 mg/dL — ABNORMAL HIGH (ref 70–99)
Potassium: 4.6 mmol/L (ref 3.5–5.1)
Sodium: 137 mmol/L (ref 135–145)
Total Bilirubin: 0.9 mg/dL (ref 0.0–1.2)
Total Protein: 7.8 g/dL (ref 6.5–8.1)

## 2024-05-27 LAB — CBC WITH DIFFERENTIAL/PLATELET
Abs Immature Granulocytes: 0.02 10*3/uL (ref 0.00–0.07)
Basophils Absolute: 0 10*3/uL (ref 0.0–0.1)
Basophils Relative: 0 %
Eosinophils Absolute: 0.1 10*3/uL (ref 0.0–0.5)
Eosinophils Relative: 1 %
HCT: 40.5 % (ref 39.0–52.0)
Hemoglobin: 12.7 g/dL — ABNORMAL LOW (ref 13.0–17.0)
Immature Granulocytes: 0 %
Lymphocytes Relative: 13 %
Lymphs Abs: 1.1 10*3/uL (ref 0.7–4.0)
MCH: 26.9 pg (ref 26.0–34.0)
MCHC: 31.4 g/dL (ref 30.0–36.0)
MCV: 85.8 fL (ref 80.0–100.0)
Monocytes Absolute: 0.6 10*3/uL (ref 0.1–1.0)
Monocytes Relative: 6 %
Neutro Abs: 7.1 10*3/uL (ref 1.7–7.7)
Neutrophils Relative %: 80 %
Platelets: 293 10*3/uL (ref 150–400)
RBC: 4.72 MIL/uL (ref 4.22–5.81)
RDW: 25.8 % — ABNORMAL HIGH (ref 11.5–15.5)
WBC: 8.9 10*3/uL (ref 4.0–10.5)
nRBC: 0 % (ref 0.0–0.2)

## 2024-05-27 NOTE — Progress Notes (Signed)
 Patient Care Team: [name], MD as PCP - General (Internal Medicine) Francyne Headland, MD as PCP - Cardiology (Cardiology) Beuford Anes, MD as Consulting Physician (Orthopedic Surgery) Devere Lonni Righter, MD as Consulting Physician (Urology) Skeet Juliene SAUNDERS, DO as Consulting Physician (Neurology)  Clinic Day:  05/27/2024  Referring physician: [name], MD   CHIEF COMPLAINT:  CC: Gastric adenocarcinoma    ASSESSMENT & PLAN:   Assessment & Plan: Raymond Barrera.  is a 63 y.o. male with Gastric adenocarcinoma   Renal cell carcinoma (HCC) Patient has a history of left kidney renal cell carcinoma s/p nephrectomy in 2020 Stage pT1a with no metastasis.  - Currently in remission - Unknown if patient follows with urology at this time  Gastric adenocarcinoma [facility]) Newly diagnosed gastric adenocarcinoma via biopsy of ulcer base on enteroscopy.  Oncology history as below Unknown stage at this time PET scan with no evidence of metastatic disease  - Will reach out to Dr. Burnette for probable EUS the week of 06/06/2024 - Discussed with patient and his wife that based on stage, patient might require a combination of chemotherapy, surgery with or without radiation  Return to clinic 1 week after EUS with Dr. Burnette to discuss results and further management  Iron  deficiency anemia Iron  deficiency anemia likely secondary to bleeding from stomach cancer.  Improved with iron  supplementation. S/p 1 g Monoferric on 05/10/2024   - Continue taking iron  pills every other day.  Use MiraLAX  for constipation  Submandibular swelling Patient has submandibular swelling on the left side that started yesterday. Soft to palpate Patient has no fever at this time  - Will obtain an ultrasound of the area to rule out abscess - Recommended patient to go to the ER if he develops a fever - Will obtain labs today to rule out leukocytosis and probable sepsis    The patient understands  the plans discussed today and is in agreement with them.  He knows to contact our office if he develops concerns prior to his next appointment.  I provided 30 minutes of face-to-face time during this encounter and > 50% was spent counseling as documented under my assessment and plan.    [name], MD  [facility] - A DEPT OF [name] [address] [place] [address] Dept: [phone] Dept Fax: [phone]   Orders Placed This Encounter  Procedures   US  Soft Tissue Head/Neck    Standing Status:   Future    Expected Date:   05/27/2024    Expiration Date:   05/27/2025    Reason for Exam (SYMPTOM  OR DIAGNOSIS REQUIRED):   Left submandibular swelling    Preferred imaging location?:   [facility]   CBC with Differential/Platelet    Standing Status:   Future    Number of Occurrences:   1    Expected Date:   05/27/2024    Expiration Date:   08/25/2024   Comprehensive metabolic panel with GFR    Standing Status:   Future    Number of Occurrences:   1    Expected Date:   05/27/2024    Expiration Date:   08/25/2024     ONCOLOGY HISTORY:   Oncology History  Gastric adenocarcinoma (HCC)  04/19/2024 Pathology Results   A. STOMACH, ABNORMAL MUCOSA BIOPSY:   Invasive moderately differentiated adenocarcinoma  Reactive gastropathy with intestinal metaplasia showing focal dysplasia  Negative for H.  pylori    04/29/2024 Initial Diagnosis   Gastric adenocarcinoma (HCC)   05/11/2024 Pathology Results   [facility]is tissue testing:  CLDN18: Positive PD-L1: Positive, CPS: 10 HER2: Negative  MSI: Stable, MMR: Proficient, TMB: Low, 5 mut/Mb  BRAF, RET, NTRK 1/2/3, ARID1A, BCR, CCN E1, CDH 1, EBER, EGFR, FGFR 2, KRAS, MET, PIK3CA, RHOA: Negative   05/19/2024 PET scan   IMPRESSION: Only slight area of asymmetric uptake along the midbody of the stomach with an adjacent metallic clip. Please correlate for the exact  location patient's ulcer and neoplasm as reported on endoscopy. No additional areas of abnormal uptake at this time.   Renal cell carcinoma (HCC)  03/30/2019 Cancer Staging   Staging form: Kidney, AJCC 8th Edition - Clinical stage from 03/30/2019: Stage I (cT1a, cN0, cM0) - Signed by Davonna Siad, MD on 05/03/2024 Stage prefix: Initial diagnosis   05/03/2024 Initial Diagnosis   Renal cell carcinoma (HCC)       Current Treatment:  TBD  INTERVAL HISTORY:  Raymond Barrera. is here today for follow up. Patient is accompanied by his wife and mother today. He has experienced a decrease in energy levels following an iron  infusion, which initially improved his energy but has since declined. He feels less energetic and more somber, impacting his ability to stay active and perform tasks for his mother.  His weight has been dropping rapidly, despite maintaining a regular meal schedule, though he is eating less than before. He has lost three pounds since the last visit.  He has developed a large lipoma on the back of his neck and an abscess in his jaw, which appeared two days ago and is causing pain. He also reports pain in his shoulder and neck for the past three days.  He experienced dizziness and a fall at home last week, though he did not sustain any injuries. He feels off-centered and dizzy, which improves when lying down.  No fever, with a recent temperature of 98.32F, and no blood in stools.  I have reviewed the past medical history, past surgical history, social history and family history with the patient and they are unchanged from previous note.  ALLERGIES:  is allergic to poison ivy extract [poison ivy extract] and coreg  [carvedilol ].  MEDICATIONS:  Current Outpatient Medications  Medication Sig Dispense Refill   Accu-Chek Softclix Lancets lancets SMARTSIG:Topical 2-3 Times Daily     acetaminophen  (TYLENOL ) 325 MG tablet Take 2 tablets (650 mg total) by mouth every 6 (six) hours as  needed for mild pain (pain score 1-3) (or Fever >/= 101).     albuterol  (VENTOLIN  HFA) 108 (90 Base) MCG/ACT inhaler Inhale 2 puffs into the lungs every 6 (six) hours as needed for wheezing or shortness of breath (cough). 8 g 2   amLODipine  (NORVASC ) 2.5 MG tablet Take 1 tablet (2.5 mg total) by mouth daily. 90 tablet 3   aspirin  EC 81 MG tablet Take 1 tablet (81 mg total) by mouth daily with breakfast. Swallow whole. 30 tablet 12   atorvastatin  (LIPITOR) 40 MG tablet Take 1 tablet (40 mg total) by mouth daily. 90 tablet 3   Camphor-Menthol -Methyl Sal (SALONPAS EX) Apply topically. As needed     clopidogrel  (PLAVIX ) 75 MG tablet Take 1 tablet (75 mg total) by mouth daily. 30 tablet 11   cyanocobalamin  (VITAMIN B12) 1000 MCG tablet Take 1,000 mcg by mouth daily.     DULoxetine  (CYMBALTA ) 30 MG capsule Take 1 capsule (30 mg total) by mouth 2 (  two) times daily. 60 capsule 3   ferrous sulfate  325 (65 FE) MG EC tablet Take 1 tablet (325 mg total) by mouth every Tuesday, Thursday, Saturday, and Sunday. 45 tablet 3   gabapentin  (NEURONTIN ) 300 MG capsule Take 1 capsule (300 mg total) by mouth at bedtime. 90 capsule 1   JARDIANCE  10 MG TABS tablet Take 1 tablet (10 mg total) by mouth daily. 30 tablet 3   pantoprazole  (PROTONIX ) 40 MG tablet Take 1 tablet (40 mg total) by mouth 2 (two) times daily. 60 tablet 2   polyethylene glycol powder (GLYCOLAX /MIRALAX ) 17 GM/SCOOP powder Take 8.5 g by mouth daily.     rOPINIRole  (REQUIP ) 2 MG tablet Take 1 tablet (2 mg total) by mouth at bedtime. 30 tablet 1   umeclidinium-vilanterol (ANORO ELLIPTA ) 62.5-25 MCG/ACT AEPB Inhale 1 puff into the lungs daily. 60 each 3   No current facility-administered medications for this visit.    REVIEW OF SYSTEMS:   Constitutional: Denies fevers, chills or abnormal weight loss Eyes: Denies blurriness of vision Ears, nose, mouth, throat, and face: Denies mucositis or sore throat Respiratory: Denies cough, dyspnea or  wheezes Cardiovascular: Denies palpitation, chest discomfort or lower extremity swelling Gastrointestinal:  Denies nausea, heartburn or change in bowel habits Skin: Denies abnormal skin rashes Lymphatics: Denies new lymphadenopathy or easy bruising Neurological:Denies numbness, tingling or new weaknesses Behavioral/Psych: Mood is stable, no new changes  All other systems were reviewed with the patient and are negative.   VITALS:  Blood pressure (!) 131/102, pulse (!) 101, temperature 98.3 F (36.8 C), temperature source Oral, resp. rate 19, weight 233 lb 6.4 oz (105.9 kg), SpO2 100%.  Wt Readings from Last 3 Encounters:  05/27/24 233 lb 6.4 oz (105.9 kg)  05/18/24 236 lb 3.2 oz (107.1 kg)  05/02/24 244 lb 0.8 oz (110.7 kg)    Body mass index is 26.97 kg/m.  Performance status (ECOG): 1 - Symptomatic but completely ambulatory  PHYSICAL EXAM:   GENERAL:alert, no distress and comfortable SKIN: skin color, texture, turgor are normal, no rashes or significant lesions OROPHARYNX: Swelling in the left submandibular area, soft, mildly tender, no lymphadenopathy palpated NECK: supple, thyroid normal size, non-tender, without nodularity LYMPH:  no palpable lymphadenopathy in the cervical, axillary or inguinal LUNGS: clear to auscultation and percussion with normal breathing effort HEART: regular rate & rhythm and no murmurs and no lower extremity edema ABDOMEN:abdomen soft, non-tender and normal bowel sounds Musculoskeletal:no cyanosis of digits and no clubbing  NEURO: alert & oriented x 3 with fluent speech  LABORATORY DATA:  I have reviewed the data as listed    Component Value Date/Time   NA 137 05/27/2024 1350   NA 139 01/26/2024 1620   K 4.6 05/27/2024 1350   CL 104 05/27/2024 1350   CO2 22 05/27/2024 1350   GLUCOSE 120 (H) 05/27/2024 1350   BUN 27 (H) 05/27/2024 1350   BUN 28 (H) 01/26/2024 1620   CREATININE 1.88 (H) 05/27/2024 1350   CALCIUM  11.3 (H) 05/27/2024 1350    PROT 7.8 05/27/2024 1350   PROT 7.2 01/01/2024 1012   ALBUMIN  3.9 05/27/2024 1350   ALBUMIN  4.4 01/01/2024 1012   AST 20 05/27/2024 1350   ALT 32 05/27/2024 1350   ALKPHOS 92 05/27/2024 1350   BILITOT 0.9 05/27/2024 1350   BILITOT <0.2 01/01/2024 1012   GFRNONAA 39 (L) 05/27/2024 1350   GFRAA 40 (L) 04/12/2020 1425    Lab Results  Component Value Date   WBC 10.0 05/02/2024  NEUTROABS 8.0 (H) 05/02/2024   HGB 11.0 (L) 05/02/2024   HCT 36.2 (L) 05/02/2024   MCV 82.1 05/02/2024   PLT 250 05/02/2024      Chemistry      Component Value Date/Time   NA 137 05/27/2024 1350   NA 139 01/26/2024 1620   K 4.6 05/27/2024 1350   CL 104 05/27/2024 1350   CO2 22 05/27/2024 1350   BUN 27 (H) 05/27/2024 1350   BUN 28 (H) 01/26/2024 1620   CREATININE 1.88 (H) 05/27/2024 1350      Component Value Date/Time   CALCIUM  11.3 (H) 05/27/2024 1350   ALKPHOS 92 05/27/2024 1350   AST 20 05/27/2024 1350   ALT 32 05/27/2024 1350   BILITOT 0.9 05/27/2024 1350   BILITOT <0.2 01/01/2024 1012       RADIOGRAPHIC STUDIES: I have personally reviewed the radiological images as listed and agreed with the findings in the report.  NM PET Image Initial (PI) Skull Base To Thigh Result Date: 05/21/2024 CLINICAL DATA:  Initial treatment strategy for gastric cancer. EXAM: NUCLEAR MEDICINE PET SKULL BASE TO THIGH TECHNIQUE: 12.25 mCi F-18 FDG was injected intravenously. Full-ring PET imaging was performed from the skull base to thigh after the radiotracer. CT data was obtained and used for attenuation correction and anatomic localization. Fasting blood glucose: 156 mg/dl COMPARISON:  Lung cancer screening CT 07/19/2021. clinic notes reviewed in epic FINDINGS: Mediastinal blood pool activity: SUV max 2.4 Liver activity: SUV max 2.6 NECK: There is no specific abnormal radiotracer uptake in the neck including along lymph node change of the submandibular, posterior triangle or internal jugular region. Near symmetric  uptake of the visualized intracranial compartment. Incidental CT findings: The parotid glands, submandibular glands and thyroid gland are grossly unremarkable. Scattered vascular calcifications along carotid vasculature. Visualized portions of the paranasal sinuses are clear. The mastoid air cells are underpneumatized. CHEST: No specific abnormal uptake above blood pool in the axillary regions, hilum or mediastinum. No areas of abnormal lung uptake. Incidental CT findings: Bovine type aortic arch. Heart is enlarged. No pericardial effusion. Mild scattered vascular calcifications including along the coronary arteries and towards the aortic valve. Normal caliber thoracic esophagus. Breathing motion identified. There is some bandlike opacities at the left lung base. Atelectasis is favored over infiltrate. Scattered other areas of mild nonspecific ground-glass. No pneumothorax or effusion. ABDOMEN/PELVIS: There is physiologic distribution radiotracer along the parenchymal organs, bowel and renal collecting systems. There is a small area of uptake that is asymmetric along the midbody of the stomach with maximum SUV of 3.6. There is a clip in this location. Please correlate for the exact location of the cancer and ulcer on endoscopy. No abnormal nodal uptake identified. Incidental CT findings: On this limited CT scan, grossly the liver, spleen, adrenal glands and pancreas are unremarkable. Gallbladder is present. Left kidney is absent. Preserved contour to the urinary bladder. No abnormal calcification seen in the right kidney nor along the course of the right ureter. Large bowel has a normal course and caliber with scattered stool. Stomach and small bowel are nondilated. Scattered vascular calcifications are identified. No free air or free fluid. Portions of the pelvis are obscured by the streak artifact from the bowel hip arthroplasties. There is a focal soft tissue lipoma in the left thigh musculature. There is also  streak artifact related to the spinal fixation hardware in the lumbar region. SKELETON: No specific abnormal uptake along the visualized osseous structures. Physiologic left forearm uptake and left shoulder  muscle uptake. Incidental CT findings: Slight curvature of the spine. Diffuse scattered degenerative changes. IMPRESSION: Only slight area of asymmetric uptake along the midbody of the stomach with an adjacent metallic clip. Please correlate for the exact location patient's ulcer and neoplasm as reported on endoscopy. No additional areas of abnormal uptake at this time. Electronically Signed   By: Ranell Bring M.D.   On: 05/21/2024 10:51   ECHOCARDIOGRAM COMPLETE Result Date: 05/10/2024    ECHOCARDIOGRAM REPORT   Patient Name:   Raymond Barrera. Date of Exam: 05/09/2024 Medical Rec #:  [id number]          Height:       78.0 in Accession #:    [id number]         Weight:       244.0 lb Date of Birth:  1961/04/03          BSA:          2.457 m Patient Age:    63 years           BP:           154/112 mmHg Patient Gender: M                  HR:           76 bpm. Exam Location:  [facility] Procedure: 2D Echo, Cardiac Doppler and Color Doppler (Both Spectral and Color            Flow Doppler were utilized during procedure). Indications:    R06.02 SOB  History:        Patient has prior history of Echocardiogram examinations, most                 recent 01/25/2024. CHF, TIA and COPD; Risk Factors:Diabetes,                 Hypertension and Dyslipidemia.  Sonographer:    Alm Minerva Referring Phys: [id number] KATLYN D WEST IMPRESSIONS  1. Left ventricular ejection fraction, by estimation, is 30 to 35%. Left ventricular ejection fraction by 2D MOD biplane is 32.2 %. The left ventricle has moderate to severely decreased function. The left ventricle demonstrates global hypokinesis. There  is mild left ventricular hypertrophy. Left ventricular diastolic parameters are consistent with Grade I diastolic dysfunction (impaired  relaxation).  2. Right ventricular systolic function is low normal. The right ventricular size is mildly enlarged.  3. Left atrial size was mildly dilated.  4. The mitral valve is normal in structure. Mild mitral valve regurgitation.  5. The aortic valve is tricuspid. Aortic valve regurgitation is not visualized. Aortic valve sclerosis/calcification is present, without any evidence of aortic stenosis. Aortic valve mean gradient measures 5.0 mmHg.  6. Aortic dilatation noted. There is mild dilatation of the ascending aorta, measuring 42 mm. FINDINGS  Left Ventricle: Left ventricular ejection fraction, by estimation, is 30 to 35%. Left ventricular ejection fraction by 2D MOD biplane is 32.2 %. The left ventricle has moderate to severely decreased function. The left ventricle demonstrates global hypokinesis. Global longitudinal strain performed but not reported based on interpreter judgement due to suboptimal tracking. The left ventricular internal cavity size was normal in size. There is mild left ventricular hypertrophy. Left ventricular diastolic parameters are consistent with Grade I diastolic dysfunction (impaired relaxation). Right Ventricle: The right ventricular size is mildly enlarged. No increase in right ventricular wall thickness. Right ventricular systolic function is low normal. Left Atrium: Left atrial size was mildly  dilated. Right Atrium: Right atrial size was normal in size. Pericardium: There is no evidence of pericardial effusion. Mitral Valve: The mitral valve is normal in structure. Mild mitral valve regurgitation. Tricuspid Valve: The tricuspid valve is not well visualized. Tricuspid valve regurgitation is not demonstrated. Aortic Valve: The aortic valve is tricuspid. Aortic valve regurgitation is not visualized. Aortic valve sclerosis/calcification is present, without any evidence of aortic stenosis. Aortic valve mean gradient measures 5.0 mmHg. Aortic valve peak gradient measures 8.7 mmHg.  Aortic valve area, by VTI measures 2.72 cm. Pulmonic Valve: The pulmonic valve was normal in structure. Pulmonic valve regurgitation is mild. Aorta: Aortic dilatation noted. There is mild dilatation of the ascending aorta, measuring 42 mm. Venous: The inferior vena cava was not well visualized. IAS/Shunts: No atrial level shunt detected by color flow Doppler.  LEFT VENTRICLE PLAX 2D                        Biplane EF (MOD) LVIDd:         5.60 cm         LV Biplane EF:   Left LVIDs:         4.80 cm                          ventricular LV PW:         1.30 cm                          ejection LV IVS:        1.10 cm                          fraction by LVOT diam:     2.70 cm                          2D MOD LV SV:         76                               biplane is LV SV Index:   31                               32.2 %. LVOT Area:     5.73 cm  LV Volumes (MOD) LV vol d, MOD    130.0 ml A2C: LV vol d, MOD    163.0 ml A4C: LV vol s, MOD    87.2 ml A2C: LV vol s, MOD    111.0 ml A4C: LV SV MOD A2C:   42.8 ml LV SV MOD A4C:   163.0 ml LV SV MOD BP:    46.9 ml RIGHT VENTRICLE RV Basal diam:  4.30 cm RV Mid diam:    3.10 cm TAPSE (M-mode): 2.5 cm LEFT ATRIUM             Index        RIGHT ATRIUM           Index LA Vol (A2C):   59.2 ml 24.10 ml/m  RA Area:     22.50 cm LA Vol (A4C):   96.4 ml 39.24 ml/m  RA Volume:   71.50 ml  29.10 ml/m LA Biplane Vol: 74.1 ml  30.16 ml/m  AORTIC VALVE AV Area (Vmax):    2.75 cm AV Area (Vmean):   2.57 cm AV Area (VTI):     2.72 cm AV Vmax:           147.50 cm/s AV Vmean:          103.050 cm/s AV VTI:            0.279 m AV Peak Grad:      8.7 mmHg AV Mean Grad:      5.0 mmHg LVOT Vmax:         70.90 cm/s LVOT Vmean:        46.300 cm/s LVOT VTI:          0.133 m LVOT/AV VTI ratio: 0.48  AORTA Ao Sinus diam: 3.70 cm Ao STJ diam:   3.2 cm Ao Asc diam:   4.20 cm MITRAL VALVE MV Area (PHT): 3.60 cm    SHUNTS MV Decel Time: 211 msec    Systemic VTI:  0.13 m MV E velocity: 51.10 cm/s   Systemic Diam: 2.70 cm MV A velocity: 84.20 cm/s MV E/A ratio:  0.61 Redell Cave MD Electronically signed by Redell Cave MD Signature Date/Time: 05/10/2024/10:20:24 AM    Final

## 2024-05-27 NOTE — Assessment & Plan Note (Signed)
 Newly diagnosed gastric adenocarcinoma via biopsy of ulcer base on enteroscopy.  Oncology history as below Unknown stage at this time PET scan with no evidence of metastatic disease  - Will reach out to Dr. Burnette for probable EUS the week of 06/06/2024 - Discussed with patient and his wife that based on stage, patient might require a combination of chemotherapy, surgery with or without radiation  Return to clinic 1 week after EUS with Dr. Burnette to discuss results and further management

## 2024-05-27 NOTE — H&P (View-Only) (Signed)
 Patient Care Team: [name], MD as PCP - General (Internal Medicine) Francyne Headland, MD as PCP - Cardiology (Cardiology) Beuford Anes, MD as Consulting Physician (Orthopedic Surgery) Devere Lonni Righter, MD as Consulting Physician (Urology) Skeet Juliene SAUNDERS, DO as Consulting Physician (Neurology)  Clinic Day:  05/27/2024  Referring physician: [name], MD   CHIEF COMPLAINT:  CC: Gastric adenocarcinoma    ASSESSMENT & PLAN:   Assessment & Plan: Raymond Barrera.  is a 63 y.o. male with Gastric adenocarcinoma   Renal cell carcinoma (HCC) Patient has a history of left kidney renal cell carcinoma s/p nephrectomy in 2020 Stage pT1a with no metastasis.  - Currently in remission - Unknown if patient follows with urology at this time  Gastric adenocarcinoma [facility]) Newly diagnosed gastric adenocarcinoma via biopsy of ulcer base on enteroscopy.  Oncology history as below Unknown stage at this time PET scan with no evidence of metastatic disease  - Will reach out to Dr. Burnette for probable EUS the week of 06/06/2024 - Discussed with patient and his wife that based on stage, patient might require a combination of chemotherapy, surgery with or without radiation  Return to clinic 1 week after EUS with Dr. Burnette to discuss results and further management  Iron  deficiency anemia Iron  deficiency anemia likely secondary to bleeding from stomach cancer.  Improved with iron  supplementation. S/p 1 g Monoferric on 05/10/2024   - Continue taking iron  pills every other day.  Use MiraLAX  for constipation  Submandibular swelling Patient has submandibular swelling on the left side that started yesterday. Soft to palpate Patient has no fever at this time  - Will obtain an ultrasound of the area to rule out abscess - Recommended patient to go to the ER if he develops a fever - Will obtain labs today to rule out leukocytosis and probable sepsis    The patient understands  the plans discussed today and is in agreement with them.  He knows to contact our office if he develops concerns prior to his next appointment.  I provided 30 minutes of face-to-face time during this encounter and > 50% was spent counseling as documented under my assessment and plan.    [name], MD  [facility] - A DEPT OF [name] [address] [place] [address] Dept: [phone] Dept Fax: [phone]   Orders Placed This Encounter  Procedures   US  Soft Tissue Head/Neck    Standing Status:   Future    Expected Date:   05/27/2024    Expiration Date:   05/27/2025    Reason for Exam (SYMPTOM  OR DIAGNOSIS REQUIRED):   Left submandibular swelling    Preferred imaging location?:   [facility]   CBC with Differential/Platelet    Standing Status:   Future    Number of Occurrences:   1    Expected Date:   05/27/2024    Expiration Date:   08/25/2024   Comprehensive metabolic panel with GFR    Standing Status:   Future    Number of Occurrences:   1    Expected Date:   05/27/2024    Expiration Date:   08/25/2024     ONCOLOGY HISTORY:   Oncology History  Gastric adenocarcinoma (HCC)  04/19/2024 Pathology Results   A. STOMACH, ABNORMAL MUCOSA BIOPSY:   Invasive moderately differentiated adenocarcinoma  Reactive gastropathy with intestinal metaplasia showing focal dysplasia  Negative for H.  pylori    04/29/2024 Initial Diagnosis   Gastric adenocarcinoma (HCC)   05/11/2024 Pathology Results   Caris tissue testing:  CLDN18: Positive PD-L1: Positive, CPS: 10 HER2: Negative  MSI: Stable, MMR: Proficient, TMB: Low, 5 mut/Mb  BRAF, RET, NTRK 1/2/3, ARID1A, BCR, CCN E1, CDH 1, EBER, EGFR, FGFR 2, KRAS, MET, PIK3CA, RHOA: Negative   05/19/2024 PET scan   IMPRESSION: Only slight area of asymmetric uptake along the midbody of the stomach with an adjacent metallic clip. Please correlate for the exact  location patient's ulcer and neoplasm as reported on endoscopy. No additional areas of abnormal uptake at this time.   Renal cell carcinoma (HCC)  03/30/2019 Cancer Staging   Staging form: Kidney, AJCC 8th Edition - Clinical stage from 03/30/2019: Stage I (cT1a, cN0, cM0) - Signed by Davonna Siad, MD on 05/03/2024 Stage prefix: Initial diagnosis   05/03/2024 Initial Diagnosis   Renal cell carcinoma (HCC)       Current Treatment:  TBD  INTERVAL HISTORY:  Raymond Barrera. is here today for follow up. Patient is accompanied by his wife and mother today. He has experienced a decrease in energy levels following an iron  infusion, which initially improved his energy but has since declined. He feels less energetic and more somber, impacting his ability to stay active and perform tasks for his mother.  His weight has been dropping rapidly, despite maintaining a regular meal schedule, though he is eating less than before. He has lost three pounds since the last visit.  He has developed a large lipoma on the back of his neck and an abscess in his jaw, which appeared two days ago and is causing pain. He also reports pain in his shoulder and neck for the past three days.  He experienced dizziness and a fall at home last week, though he did not sustain any injuries. He feels off-centered and dizzy, which improves when lying down.  No fever, with a recent temperature of 98.32F, and no blood in stools.  I have reviewed the past medical history, past surgical history, social history and family history with the patient and they are unchanged from previous note.  ALLERGIES:  is allergic to poison ivy extract [poison ivy extract] and coreg  [carvedilol ].  MEDICATIONS:  Current Outpatient Medications  Medication Sig Dispense Refill   Accu-Chek Softclix Lancets lancets SMARTSIG:Topical 2-3 Times Daily     acetaminophen  (TYLENOL ) 325 MG tablet Take 2 tablets (650 mg total) by mouth every 6 (six) hours as  needed for mild pain (pain score 1-3) (or Fever >/= 101).     albuterol  (VENTOLIN  HFA) 108 (90 Base) MCG/ACT inhaler Inhale 2 puffs into the lungs every 6 (six) hours as needed for wheezing or shortness of breath (cough). 8 g 2   amLODipine  (NORVASC ) 2.5 MG tablet Take 1 tablet (2.5 mg total) by mouth daily. 90 tablet 3   aspirin  EC 81 MG tablet Take 1 tablet (81 mg total) by mouth daily with breakfast. Swallow whole. 30 tablet 12   atorvastatin  (LIPITOR) 40 MG tablet Take 1 tablet (40 mg total) by mouth daily. 90 tablet 3   Camphor-Menthol -Methyl Sal (SALONPAS EX) Apply topically. As needed     clopidogrel  (PLAVIX ) 75 MG tablet Take 1 tablet (75 mg total) by mouth daily. 30 tablet 11   cyanocobalamin  (VITAMIN B12) 1000 MCG tablet Take 1,000 mcg by mouth daily.     DULoxetine  (CYMBALTA ) 30 MG capsule Take 1 capsule (30 mg total) by mouth 2 (  two) times daily. 60 capsule 3   ferrous sulfate  325 (65 FE) MG EC tablet Take 1 tablet (325 mg total) by mouth every Tuesday, Thursday, Saturday, and Sunday. 45 tablet 3   gabapentin  (NEURONTIN ) 300 MG capsule Take 1 capsule (300 mg total) by mouth at bedtime. 90 capsule 1   JARDIANCE  10 MG TABS tablet Take 1 tablet (10 mg total) by mouth daily. 30 tablet 3   pantoprazole  (PROTONIX ) 40 MG tablet Take 1 tablet (40 mg total) by mouth 2 (two) times daily. 60 tablet 2   polyethylene glycol powder (GLYCOLAX /MIRALAX ) 17 GM/SCOOP powder Take 8.5 g by mouth daily.     rOPINIRole  (REQUIP ) 2 MG tablet Take 1 tablet (2 mg total) by mouth at bedtime. 30 tablet 1   umeclidinium-vilanterol (ANORO ELLIPTA ) 62.5-25 MCG/ACT AEPB Inhale 1 puff into the lungs daily. 60 each 3   No current facility-administered medications for this visit.    REVIEW OF SYSTEMS:   Constitutional: Denies fevers, chills or abnormal weight loss Eyes: Denies blurriness of vision Ears, nose, mouth, throat, and face: Denies mucositis or sore throat Respiratory: Denies cough, dyspnea or  wheezes Cardiovascular: Denies palpitation, chest discomfort or lower extremity swelling Gastrointestinal:  Denies nausea, heartburn or change in bowel habits Skin: Denies abnormal skin rashes Lymphatics: Denies new lymphadenopathy or easy bruising Neurological:Denies numbness, tingling or new weaknesses Behavioral/Psych: Mood is stable, no new changes  All other systems were reviewed with the patient and are negative.   VITALS:  Blood pressure (!) 131/102, pulse (!) 101, temperature 98.3 F (36.8 C), temperature source Oral, resp. rate 19, weight 233 lb 6.4 oz (105.9 kg), SpO2 100%.  Wt Readings from Last 3 Encounters:  05/27/24 233 lb 6.4 oz (105.9 kg)  05/18/24 236 lb 3.2 oz (107.1 kg)  05/02/24 244 lb 0.8 oz (110.7 kg)    Body mass index is 26.97 kg/m.  Performance status (ECOG): 1 - Symptomatic but completely ambulatory  PHYSICAL EXAM:   GENERAL:alert, no distress and comfortable SKIN: skin color, texture, turgor are normal, no rashes or significant lesions OROPHARYNX: Swelling in the left submandibular area, soft, mildly tender, no lymphadenopathy palpated NECK: supple, thyroid normal size, non-tender, without nodularity LYMPH:  no palpable lymphadenopathy in the cervical, axillary or inguinal LUNGS: clear to auscultation and percussion with normal breathing effort HEART: regular rate & rhythm and no murmurs and no lower extremity edema ABDOMEN:abdomen soft, non-tender and normal bowel sounds Musculoskeletal:no cyanosis of digits and no clubbing  NEURO: alert & oriented x 3 with fluent speech  LABORATORY DATA:  I have reviewed the data as listed    Component Value Date/Time   NA 137 05/27/2024 1350   NA 139 01/26/2024 1620   K 4.6 05/27/2024 1350   CL 104 05/27/2024 1350   CO2 22 05/27/2024 1350   GLUCOSE 120 (H) 05/27/2024 1350   BUN 27 (H) 05/27/2024 1350   BUN 28 (H) 01/26/2024 1620   CREATININE 1.88 (H) 05/27/2024 1350   CALCIUM  11.3 (H) 05/27/2024 1350    PROT 7.8 05/27/2024 1350   PROT 7.2 01/01/2024 1012   ALBUMIN  3.9 05/27/2024 1350   ALBUMIN  4.4 01/01/2024 1012   AST 20 05/27/2024 1350   ALT 32 05/27/2024 1350   ALKPHOS 92 05/27/2024 1350   BILITOT 0.9 05/27/2024 1350   BILITOT <0.2 01/01/2024 1012   GFRNONAA 39 (L) 05/27/2024 1350   GFRAA 40 (L) 04/12/2020 1425    Lab Results  Component Value Date   WBC 10.0 05/02/2024  NEUTROABS 8.0 (H) 05/02/2024   HGB 11.0 (L) 05/02/2024   HCT 36.2 (L) 05/02/2024   MCV 82.1 05/02/2024   PLT 250 05/02/2024      Chemistry      Component Value Date/Time   NA 137 05/27/2024 1350   NA 139 01/26/2024 1620   K 4.6 05/27/2024 1350   CL 104 05/27/2024 1350   CO2 22 05/27/2024 1350   BUN 27 (H) 05/27/2024 1350   BUN 28 (H) 01/26/2024 1620   CREATININE 1.88 (H) 05/27/2024 1350      Component Value Date/Time   CALCIUM  11.3 (H) 05/27/2024 1350   ALKPHOS 92 05/27/2024 1350   AST 20 05/27/2024 1350   ALT 32 05/27/2024 1350   BILITOT 0.9 05/27/2024 1350   BILITOT <0.2 01/01/2024 1012       RADIOGRAPHIC STUDIES: I have personally reviewed the radiological images as listed and agreed with the findings in the report.  NM PET Image Initial (PI) Skull Base To Thigh Result Date: 05/21/2024 CLINICAL DATA:  Initial treatment strategy for gastric cancer. EXAM: NUCLEAR MEDICINE PET SKULL BASE TO THIGH TECHNIQUE: 12.25 mCi F-18 FDG was injected intravenously. Full-ring PET imaging was performed from the skull base to thigh after the radiotracer. CT data was obtained and used for attenuation correction and anatomic localization. Fasting blood glucose: 156 mg/dl COMPARISON:  Lung cancer screening CT 07/19/2021. clinic notes reviewed in epic FINDINGS: Mediastinal blood pool activity: SUV max 2.4 Liver activity: SUV max 2.6 NECK: There is no specific abnormal radiotracer uptake in the neck including along lymph node change of the submandibular, posterior triangle or internal jugular region. Near symmetric  uptake of the visualized intracranial compartment. Incidental CT findings: The parotid glands, submandibular glands and thyroid gland are grossly unremarkable. Scattered vascular calcifications along carotid vasculature. Visualized portions of the paranasal sinuses are clear. The mastoid air cells are underpneumatized. CHEST: No specific abnormal uptake above blood pool in the axillary regions, hilum or mediastinum. No areas of abnormal lung uptake. Incidental CT findings: Bovine type aortic arch. Heart is enlarged. No pericardial effusion. Mild scattered vascular calcifications including along the coronary arteries and towards the aortic valve. Normal caliber thoracic esophagus. Breathing motion identified. There is some bandlike opacities at the left lung base. Atelectasis is favored over infiltrate. Scattered other areas of mild nonspecific ground-glass. No pneumothorax or effusion. ABDOMEN/PELVIS: There is physiologic distribution radiotracer along the parenchymal organs, bowel and renal collecting systems. There is a small area of uptake that is asymmetric along the midbody of the stomach with maximum SUV of 3.6. There is a clip in this location. Please correlate for the exact location of the cancer and ulcer on endoscopy. No abnormal nodal uptake identified. Incidental CT findings: On this limited CT scan, grossly the liver, spleen, adrenal glands and pancreas are unremarkable. Gallbladder is present. Left kidney is absent. Preserved contour to the urinary bladder. No abnormal calcification seen in the right kidney nor along the course of the right ureter. Large bowel has a normal course and caliber with scattered stool. Stomach and small bowel are nondilated. Scattered vascular calcifications are identified. No free air or free fluid. Portions of the pelvis are obscured by the streak artifact from the bowel hip arthroplasties. There is a focal soft tissue lipoma in the left thigh musculature. There is also  streak artifact related to the spinal fixation hardware in the lumbar region. SKELETON: No specific abnormal uptake along the visualized osseous structures. Physiologic left forearm uptake and left shoulder  muscle uptake. Incidental CT findings: Slight curvature of the spine. Diffuse scattered degenerative changes. IMPRESSION: Only slight area of asymmetric uptake along the midbody of the stomach with an adjacent metallic clip. Please correlate for the exact location patient's ulcer and neoplasm as reported on endoscopy. No additional areas of abnormal uptake at this time. Electronically Signed   By: Ranell Bring M.D.   On: 05/21/2024 10:51   ECHOCARDIOGRAM COMPLETE Result Date: 05/10/2024    ECHOCARDIOGRAM REPORT   Patient Name:   Raymond Barrera. Date of Exam: 05/09/2024 Medical Rec #:  [id number]          Height:       78.0 in Accession #:    [id number]         Weight:       244.0 lb Date of Birth:  1961/04/03          BSA:          2.457 m Patient Age:    63 years           BP:           154/112 mmHg Patient Gender: M                  HR:           76 bpm. Exam Location:  [facility] Procedure: 2D Echo, Cardiac Doppler and Color Doppler (Both Spectral and Color            Flow Doppler were utilized during procedure). Indications:    R06.02 SOB  History:        Patient has prior history of Echocardiogram examinations, most                 recent 01/25/2024. CHF, TIA and COPD; Risk Factors:Diabetes,                 Hypertension and Dyslipidemia.  Sonographer:    Alm Minerva Referring Phys: [id number] KATLYN D WEST IMPRESSIONS  1. Left ventricular ejection fraction, by estimation, is 30 to 35%. Left ventricular ejection fraction by 2D MOD biplane is 32.2 %. The left ventricle has moderate to severely decreased function. The left ventricle demonstrates global hypokinesis. There  is mild left ventricular hypertrophy. Left ventricular diastolic parameters are consistent with Grade I diastolic dysfunction (impaired  relaxation).  2. Right ventricular systolic function is low normal. The right ventricular size is mildly enlarged.  3. Left atrial size was mildly dilated.  4. The mitral valve is normal in structure. Mild mitral valve regurgitation.  5. The aortic valve is tricuspid. Aortic valve regurgitation is not visualized. Aortic valve sclerosis/calcification is present, without any evidence of aortic stenosis. Aortic valve mean gradient measures 5.0 mmHg.  6. Aortic dilatation noted. There is mild dilatation of the ascending aorta, measuring 42 mm. FINDINGS  Left Ventricle: Left ventricular ejection fraction, by estimation, is 30 to 35%. Left ventricular ejection fraction by 2D MOD biplane is 32.2 %. The left ventricle has moderate to severely decreased function. The left ventricle demonstrates global hypokinesis. Global longitudinal strain performed but not reported based on interpreter judgement due to suboptimal tracking. The left ventricular internal cavity size was normal in size. There is mild left ventricular hypertrophy. Left ventricular diastolic parameters are consistent with Grade I diastolic dysfunction (impaired relaxation). Right Ventricle: The right ventricular size is mildly enlarged. No increase in right ventricular wall thickness. Right ventricular systolic function is low normal. Left Atrium: Left atrial size was mildly  dilated. Right Atrium: Right atrial size was normal in size. Pericardium: There is no evidence of pericardial effusion. Mitral Valve: The mitral valve is normal in structure. Mild mitral valve regurgitation. Tricuspid Valve: The tricuspid valve is not well visualized. Tricuspid valve regurgitation is not demonstrated. Aortic Valve: The aortic valve is tricuspid. Aortic valve regurgitation is not visualized. Aortic valve sclerosis/calcification is present, without any evidence of aortic stenosis. Aortic valve mean gradient measures 5.0 mmHg. Aortic valve peak gradient measures 8.7 mmHg.  Aortic valve area, by VTI measures 2.72 cm. Pulmonic Valve: The pulmonic valve was normal in structure. Pulmonic valve regurgitation is mild. Aorta: Aortic dilatation noted. There is mild dilatation of the ascending aorta, measuring 42 mm. Venous: The inferior vena cava was not well visualized. IAS/Shunts: No atrial level shunt detected by color flow Doppler.  LEFT VENTRICLE PLAX 2D                        Biplane EF (MOD) LVIDd:         5.60 cm         LV Biplane EF:   Left LVIDs:         4.80 cm                          ventricular LV PW:         1.30 cm                          ejection LV IVS:        1.10 cm                          fraction by LVOT diam:     2.70 cm                          2D MOD LV SV:         76                               biplane is LV SV Index:   31                               32.2 %. LVOT Area:     5.73 cm  LV Volumes (MOD) LV vol d, MOD    130.0 ml A2C: LV vol d, MOD    163.0 ml A4C: LV vol s, MOD    87.2 ml A2C: LV vol s, MOD    111.0 ml A4C: LV SV MOD A2C:   42.8 ml LV SV MOD A4C:   163.0 ml LV SV MOD BP:    46.9 ml RIGHT VENTRICLE RV Basal diam:  4.30 cm RV Mid diam:    3.10 cm TAPSE (M-mode): 2.5 cm LEFT ATRIUM             Index        RIGHT ATRIUM           Index LA Vol (A2C):   59.2 ml 24.10 ml/m  RA Area:     22.50 cm LA Vol (A4C):   96.4 ml 39.24 ml/m  RA Volume:   71.50 ml  29.10 ml/m LA Biplane Vol: 74.1 ml  30.16 ml/m  AORTIC VALVE AV Area (Vmax):    2.75 cm AV Area (Vmean):   2.57 cm AV Area (VTI):     2.72 cm AV Vmax:           147.50 cm/s AV Vmean:          103.050 cm/s AV VTI:            0.279 m AV Peak Grad:      8.7 mmHg AV Mean Grad:      5.0 mmHg LVOT Vmax:         70.90 cm/s LVOT Vmean:        46.300 cm/s LVOT VTI:          0.133 m LVOT/AV VTI ratio: 0.48  AORTA Ao Sinus diam: 3.70 cm Ao STJ diam:   3.2 cm Ao Asc diam:   4.20 cm MITRAL VALVE MV Area (PHT): 3.60 cm    SHUNTS MV Decel Time: 211 msec    Systemic VTI:  0.13 m MV E velocity: 51.10 cm/s   Systemic Diam: 2.70 cm MV A velocity: 84.20 cm/s MV E/A ratio:  0.61 Redell Cave MD Electronically signed by Redell Cave MD Signature Date/Time: 05/10/2024/10:20:24 AM    Final

## 2024-05-27 NOTE — Assessment & Plan Note (Signed)
 Patient has submandibular swelling on the left side that started yesterday. Soft to palpate Patient has no fever at this time  - Will obtain an ultrasound of the area to rule out abscess - Recommended patient to go to the ER if he develops a fever - Will obtain labs today to rule out leukocytosis and probable sepsis

## 2024-05-27 NOTE — Assessment & Plan Note (Signed)
 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  - Currently in remission - Unknown if patient follows with urology at this time

## 2024-05-27 NOTE — Assessment & Plan Note (Signed)
 Iron  deficiency anemia likely secondary to bleeding from stomach cancer.  Improved with iron  supplementation. S/p 1 g Monoferric on 05/10/2024   - Continue taking iron  pills every other day.  Use MiraLAX  for constipation

## 2024-05-30 ENCOUNTER — Telehealth: Payer: Self-pay | Admitting: *Deleted

## 2024-05-30 ENCOUNTER — Other Ambulatory Visit: Payer: Self-pay | Admitting: Gastroenterology

## 2024-05-30 NOTE — Telephone Encounter (Signed)
   Pre-operative Risk Assessment    Patient Name: Raymond Barrera.  DOB: [birth year] MRN: [id number]   Date of last office visit: 04/22/24 ROSABEL MOSE, NP Date of next office visit: 08/05/24 DR. CROITORU   Request for Surgical Clearance    Procedure:  EUS-GASTRIC CANCER  Date of Surgery:  Clearance 06/15/24                                Surgeon:  DR. BURNETTE Surgeon's Group or Practice Name:  [facility] Phone number:  [phone] Fax number:  [phone]   Type of Clearance Requested:   - Medical  - Pharmacy:  Hold Clopidogrel  (Plavix )     Type of Anesthesia:  PROPOFOL    Additional requests/questions:    Bonney Niels Jest   05/30/2024, 11:07 AM

## 2024-05-30 NOTE — Telephone Encounter (Signed)
 Good Morning Katlyn,  We have received a surgical clearance request for Mr. Schnabel for EUS procedure. They were seen recently in clinic on 04/22/2024. Can you please comment on surgical clearance for his upcoming procedure. Please forward you guidance and recommendations to P [facility] PREOP   Thanks, Jackee Alberts, NP

## 2024-06-01 NOTE — Telephone Encounter (Signed)
   Patient Name: Raymond Barrera.  DOB: 11-Jun-1961 MRN: [id number]  Primary Cardiologist: [name], MD  Chart reviewed as part of pre-operative protocol coverage. Given past medical history and time since last visit, based on ACC/AHA guidelines, Arjun Hard. is at acceptable risk for the planned procedure without further cardiovascular testing.   [name] was seen in clinic on 04/22/2024. Patient was doing well at that time and was able to achieve greater than 4 METS of activity. Echo on 05/10/2024 showed stable reduced ejection fraction at 30 to 35%. Previously discussed with Dr. Balding, patient does not require any further cardiovascular testing at this time. Patient can proceed at appropriate cardiovascular risk. Patient's Plavix  is not prescribed by cardiology office, he is on this for history of CVA. Recommendations regarding holding of Plavix  will need to come either from patient's PCP or neurologist.   The patient was advised that if he develops new symptoms prior to surgery to contact our office to arrange for a follow-up visit, and he verbalized understanding.  I will route this recommendation to the requesting party via Epic fax function and remove from pre-op pool.  Please call with questions.  Lamarr Satterfield, NP 06/01/2024, 2:10 PM

## 2024-06-02 ENCOUNTER — Telehealth: Payer: Self-pay | Admitting: Internal Medicine

## 2024-06-02 ENCOUNTER — Ambulatory Visit (HOSPITAL_COMMUNITY)
Admission: RE | Admit: 2024-06-02 | Discharge: 2024-06-02 | Disposition: A | Discharge status: Home / Self Care | Source: Ambulatory Visit | Attending: Oncology | Admitting: Oncology

## 2024-06-02 DIAGNOSIS — R220 Localized swelling, mass and lump, head: Secondary | ICD-10-CM | POA: Diagnosis not present

## 2024-06-02 DIAGNOSIS — D170 Benign lipomatous neoplasm of skin and subcutaneous tissue of head, face and neck: Secondary | ICD-10-CM | POA: Diagnosis not present

## 2024-06-02 DIAGNOSIS — R221 Localized swelling, mass and lump, neck: Secondary | ICD-10-CM | POA: Diagnosis not present

## 2024-06-02 NOTE — Telephone Encounter (Signed)
 Medical clearance form for colonoscopy EUS for gastric cancer scheduled for 07.16.2025  Noted Copied Sleeved Original placed in provider box Copy placed in front desk folder

## 2024-06-06 ENCOUNTER — Inpatient Hospital Stay: Admitting: Oncology

## 2024-06-07 ENCOUNTER — Encounter (HOSPITAL_COMMUNITY): Payer: Self-pay | Admitting: Gastroenterology

## 2024-06-07 ENCOUNTER — Other Ambulatory Visit: Payer: Self-pay

## 2024-06-07 NOTE — Progress Notes (Signed)
 Left a message for Tiffany at Dr. Dolan office regarding patient's Plavix  and Aspirin .  Per patient's wife they have not received instructions on when to hold these medications for procedure scheduled for 06-15-24. Patient's wife stated that she would follow up on this as well.

## 2024-06-07 NOTE — Progress Notes (Addendum)
 COVID Vaccine Completed:  Date of COVID positive in last 90 days:  No  PCP - Suzzane Blanch, MD Cardiologist - [name], MD  Cardiac clearance in Epic dated 06-24-24  Chest x-ray - 02-29-24 Epic EKG - 04-22-24 Epic Stress Test - 06-15-24 Epoc ECHO - 05-09-24 Epic Cardiac Cath -  Pacemaker/ICD device last checked: Spinal Cord Stimulator: Long Term Monitor - 03-04-24 Epic  Bowel Prep -  N/A  Sleep Study - Yes, +sleep apnea.   CPAP -  Has been ordered, but patient has not picked up  Fasting Blood Sugar - 120 tp 135 Checks Blood Sugar - 1 to 2  times a day  Jardiance  Last dose of GLP1 agonist-  N/A GLP1 instructions:  Do not take after 06-11-24, pt aware  Patient's wife will call PCP to see when he needs to stop blood thinners, has not been given instructions Blood Thinner Instructions: Plavix   Last dose:   Time: Aspirin  Instructions:  ASA 81 Last Dose:  Activity level:  Can go up a flight of stairs and perform activities of daily living without stopping and without symptoms of chest pain or shortness of breath.  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  DM, HTN, OSA  Patient denies shortness of breath, fever, cough and chest pain at PAT appointment (completed over the phone)  Patient verbalized understanding of instructions that were given to them at the PAT appointment. Patient was also instructed that they will need to review over the PAT instructions again at home before surgery.

## 2024-06-10 ENCOUNTER — Telehealth: Payer: Self-pay

## 2024-06-10 NOTE — Telephone Encounter (Signed)
 Copied from CRM [id number]. Topic: Clinical - Medical Advice >> Jun 10, 2024 10:54 AM Raymond Barrera wrote: Pt wife calling to find out when pt should stop taking his medication  Gardiance, plavis, and asprin prior to his upper GI and endoscopy sch for July 16th.

## 2024-06-14 NOTE — Anesthesia Preprocedure Evaluation (Signed)
 Anesthesia Evaluation  Patient identified by MRN, date of birth, ID band Patient awake    Reviewed: Allergy & Precautions, NPO status , Patient's Chart, lab work & pertinent test results  Airway Mallampati: II  TM Distance: >3 FB Neck ROM: Full    Dental no notable dental hx. (+) Missing, Poor Dentition,    Pulmonary sleep apnea , COPD,  COPD inhaler, Current Smoker and Patient abstained from smoking.   Pulmonary exam normal breath sounds clear to auscultation       Cardiovascular hypertension, Pt. on medications and Pt. on home beta blockers (-) angina +CHF  Normal cardiovascular exam Rhythm:Regular Rate:Normal  05/09/2024 TTE  1. Left ventricular ejection fraction, by estimation, is 30 to 35%. Left  ventricular ejection fraction by 2D MOD biplane is 32.2 %. The left  ventricle has moderate to severely decreased function. The left ventricle  demonstrates global hypokinesis. There   is mild left ventricular hypertrophy. Left ventricular diastolic  parameters are consistent with Grade I diastolic dysfunction (impaired  relaxation).   2. Right ventricular systolic function is low normal. The right  ventricular size is mildly enlarged.   3. Left atrial size was mildly dilated.   4. The mitral valve is normal in structure. Mild mitral valve  regurgitation.   5. The aortic valve is tricuspid. Aortic valve regurgitation is not  visualized. Aortic valve sclerosis/calcification is present, without any  evidence of aortic stenosis. Aortic valve mean gradient measures 5.0 mmHg.   6. Aortic dilatation noted. There is mild dilatation of the ascending  aorta, measuring 42 mm.      Neuro/Psych  PSYCHIATRIC DISORDERS Anxiety     TIA Neuromuscular disease CVA (R hand weakness and standing walks w cane), Residual Symptoms    GI/Hepatic PUD,,,Gastric ca   Endo/Other  diabetes    Renal/GU Renal diseaseLab Results      Component                 Value               Date                      NA                       137                 05/27/2024                CL                       104                 05/27/2024                K                        4.6                 05/27/2024                CO2                      22                  05/27/2024  BUN                      27 (H)              05/27/2024                CREATININE               1.88 (H)            05/27/2024                GFRNONAA                 39 (L)              05/27/2024                CALCIUM                   11.3 (H)            05/27/2024                ALBUMIN                   3.9                 05/27/2024                GLUCOSE                  120 (H)             05/27/2024           Hx of renal cell adenocarcinoma     Musculoskeletal  (+) Arthritis ,    Abdominal   Peds  Hematology Lab Results      Component                Value               Date                      WBC                      8.9                 05/27/2024                HGB                      12.7 (L)            05/27/2024                HCT                      40.5                05/27/2024                MCV                      85.8                05/27/2024                PLT  293                 05/27/2024              Anesthesia Other Findings All: Coreg   Reproductive/Obstetrics                              Anesthesia Physical Anesthesia Plan  ASA: 4  Anesthesia Plan: MAC   Post-op Pain Management: Minimal or no pain anticipated   Induction:   PONV Risk Score and Plan: Propofol  infusion and Treatment may vary due to age or medical condition  Airway Management Planned: Natural Airway and Nasal Cannula  Additional Equipment: None  Intra-op Plan:   Post-operative Plan:   Informed Consent: I have reviewed the patients History and Physical, chart, labs and discussed the procedure  including the risks, benefits and alternatives for the proposed anesthesia with the patient or authorized representative who has indicated his/her understanding and acceptance.     Dental advisory given  Plan Discussed with: CRNA and Surgeon  Anesthesia Plan Comments:          Anesthesia Quick Evaluation

## 2024-06-15 ENCOUNTER — Encounter (HOSPITAL_COMMUNITY): Admitting: Anesthesiology

## 2024-06-15 ENCOUNTER — Ambulatory Visit (HOSPITAL_COMMUNITY)
Admission: RE | Admit: 2024-06-15 | Discharge: 2024-06-15 | Disposition: A | Discharge status: Home / Self Care | Attending: Gastroenterology | Admitting: Gastroenterology

## 2024-06-15 ENCOUNTER — Encounter (HOSPITAL_COMMUNITY)
Admission: RE | Disposition: A | Discharge status: Home / Self Care | Payer: Self-pay | Source: Home / Self Care | Attending: Gastroenterology

## 2024-06-15 ENCOUNTER — Other Ambulatory Visit: Payer: Self-pay

## 2024-06-15 ENCOUNTER — Ambulatory Visit (HOSPITAL_COMMUNITY): Admitting: Anesthesiology

## 2024-06-15 DIAGNOSIS — N289 Disorder of kidney and ureter, unspecified: Secondary | ICD-10-CM | POA: Insufficient documentation

## 2024-06-15 DIAGNOSIS — D509 Iron deficiency anemia, unspecified: Secondary | ICD-10-CM | POA: Diagnosis not present

## 2024-06-15 DIAGNOSIS — Z85528 Personal history of other malignant neoplasm of kidney: Secondary | ICD-10-CM | POA: Diagnosis not present

## 2024-06-15 DIAGNOSIS — F17200 Nicotine dependence, unspecified, uncomplicated: Secondary | ICD-10-CM | POA: Insufficient documentation

## 2024-06-15 DIAGNOSIS — I110 Hypertensive heart disease with heart failure: Secondary | ICD-10-CM

## 2024-06-15 DIAGNOSIS — K5900 Constipation, unspecified: Secondary | ICD-10-CM | POA: Insufficient documentation

## 2024-06-15 DIAGNOSIS — K259 Gastric ulcer, unspecified as acute or chronic, without hemorrhage or perforation: Secondary | ICD-10-CM

## 2024-06-15 DIAGNOSIS — I509 Heart failure, unspecified: Secondary | ICD-10-CM | POA: Insufficient documentation

## 2024-06-15 DIAGNOSIS — E119 Type 2 diabetes mellitus without complications: Secondary | ICD-10-CM | POA: Diagnosis not present

## 2024-06-15 DIAGNOSIS — F17210 Nicotine dependence, cigarettes, uncomplicated: Secondary | ICD-10-CM

## 2024-06-15 DIAGNOSIS — Z8673 Personal history of transient ischemic attack (TIA), and cerebral infarction without residual deficits: Secondary | ICD-10-CM | POA: Diagnosis not present

## 2024-06-15 DIAGNOSIS — G709 Myoneural disorder, unspecified: Secondary | ICD-10-CM | POA: Diagnosis not present

## 2024-06-15 DIAGNOSIS — M1990 Unspecified osteoarthritis, unspecified site: Secondary | ICD-10-CM | POA: Diagnosis not present

## 2024-06-15 DIAGNOSIS — C166 Malignant neoplasm of greater curvature of stomach, unspecified: Secondary | ICD-10-CM

## 2024-06-15 DIAGNOSIS — C162 Malignant neoplasm of body of stomach: Secondary | ICD-10-CM | POA: Insufficient documentation

## 2024-06-15 DIAGNOSIS — Z905 Acquired absence of kidney: Secondary | ICD-10-CM | POA: Insufficient documentation

## 2024-06-15 DIAGNOSIS — C169 Malignant neoplasm of stomach, unspecified: Secondary | ICD-10-CM | POA: Diagnosis not present

## 2024-06-15 DIAGNOSIS — E1122 Type 2 diabetes mellitus with diabetic chronic kidney disease: Secondary | ICD-10-CM | POA: Diagnosis not present

## 2024-06-15 DIAGNOSIS — I5022 Chronic systolic (congestive) heart failure: Secondary | ICD-10-CM | POA: Diagnosis not present

## 2024-06-15 DIAGNOSIS — Z7984 Long term (current) use of oral hypoglycemic drugs: Secondary | ICD-10-CM | POA: Diagnosis not present

## 2024-06-15 DIAGNOSIS — Z79899 Other long term (current) drug therapy: Secondary | ICD-10-CM | POA: Insufficient documentation

## 2024-06-15 DIAGNOSIS — N189 Chronic kidney disease, unspecified: Secondary | ICD-10-CM | POA: Diagnosis not present

## 2024-06-15 DIAGNOSIS — Z8711 Personal history of peptic ulcer disease: Secondary | ICD-10-CM | POA: Diagnosis not present

## 2024-06-15 DIAGNOSIS — I130 Hypertensive heart and chronic kidney disease with heart failure and stage 1 through stage 4 chronic kidney disease, or unspecified chronic kidney disease: Secondary | ICD-10-CM | POA: Diagnosis not present

## 2024-06-15 HISTORY — PX: EUS: SHX5427

## 2024-06-15 HISTORY — PX: ESOPHAGOGASTRODUODENOSCOPY: SHX5428

## 2024-06-15 LAB — GLUCOSE, CAPILLARY: Glucose-Capillary: 121 mg/dL — ABNORMAL HIGH (ref 70–99)

## 2024-06-15 SURGERY — EGD (ESOPHAGOGASTRODUODENOSCOPY)
Anesthesia: Monitor Anesthesia Care

## 2024-06-15 MED ORDER — SPOT INK MARKER SYRINGE KIT
PACK | SUBMUCOSAL | Status: DC | PRN
  Administered 2024-06-15: 1.5 mL via SUBMUCOSAL

## 2024-06-15 MED ORDER — CLOPIDOGREL BISULFATE 75 MG PO TABS: 75.0000 mg | ORAL_TABLET | Freq: Every day | ORAL | Status: DC

## 2024-06-15 MED ORDER — SODIUM CHLORIDE 0.9 % IV SOLN: INTRAVENOUS | Status: DC | PRN

## 2024-06-15 MED ORDER — SPOT INK MARKER SYRINGE KIT
PACK | SUBMUCOSAL | Status: AC
  Filled 2024-06-15: qty 5

## 2024-06-15 MED ORDER — PHENYLEPHRINE 80 MCG/ML (10ML) SYRINGE FOR IV PUSH (FOR BLOOD PRESSURE SUPPORT)
PREFILLED_SYRINGE | INTRAVENOUS | Status: DC | PRN
  Administered 2024-06-15: 160 ug via INTRAVENOUS

## 2024-06-15 MED ORDER — PROPOFOL 500 MG/50ML IV EMUL
INTRAVENOUS | Status: DC | PRN
  Administered 2024-06-15: 80 ug/kg/min via INTRAVENOUS

## 2024-06-15 NOTE — Op Note (Signed)
 [facility] Patient Name: Raymond Barrera Procedure Date: 06/15/2024 MRN: [id number] Attending MD: Elsie Cree , MD, [id number] Date of Birth: 03/24/1961 CSN: [id number] Age: 63 Admit Type: Outpatient Procedure:                Upper EUS Indications:              Staging of gastric adenocarcinoma Providers:                Elsie Cree, MD, Randall Lines, RN, Haskel Chris, Technician Referring MD:             Dr. Davonna Medicines:                Monitored Anesthesia Care Complications:            No immediate complications. Estimated Blood Loss:     Estimated blood loss: none. Procedure:                Pre-Anesthesia Assessment:                           - Prior to the procedure, a History and Physical                            was performed, and patient medications and                            allergies were reviewed. The patient's tolerance of                            previous anesthesia was also reviewed. The risks                            and benefits of the procedure and the sedation                            options and risks were discussed with the patient.                            All questions were answered, and informed consent                            was obtained. Prior Anticoagulants: The patient has                            taken Plavix  (clopidogrel ), last dose was 6 days                            prior to procedure. ASA Grade Assessment: IV - A                            patient with severe systemic disease that is a  constant threat to life. After reviewing the risks                            and benefits, the patient was deemed in                            satisfactory condition to undergo the procedure.                           After obtaining informed consent, the endoscope was                            passed under direct vision. Throughout the                             procedure, the patient's blood pressure, pulse, and                            oxygen  saturations were monitored continuously. The                            [id number]-[id number] ([id number]) Olympus endoscope was introduced                            through the mouth, and advanced to the second part                            of duodenum. The [id number]-[id number] ([id number]) Olympus                            radial ultrasound scope was introduced through the                            mouth, and advanced to the prepyloric region,                            stomach. The upper EUS was accomplished without                            difficulty. The patient tolerated the procedure                            well. Scope In: Scope Out: Findings:      ENDOSCOPIC FINDING: :      The examined esophagus was normal.      One cratered gastric ulcer with no stigmata of bleeding was found on the       greater curvature of the gastric body. The lesion was 10 mm in largest       dimension. Area was tattooed with an injection of 2 mL of Spot (carbon       black).      The exam of the stomach was otherwise normal.      The duodenal bulb, first portion of the duodenum and second portion of       the duodenum were normal.  ENDOSONOGRAPHIC FINDING: :      An oval intramural (subepithelial) lesion was found in the greater curve       of the stomach and in the body of the stomach. The lesion was       hypoechoic. Sonographically, the lesion appeared to originate from the       mucosa and extends into the muscularis propria. No clear lesional       penetration of the muscularis propria was seen. The outer       endosonographic borders were poorly defined.      No lymphadenopathy seen. Impression:               - Normal esophagus.                           - Gastric ulcer with no stigmata of bleeding.                            Tattooed.                           - Normal duodenal bulb, first portion of the                             duodenum and second portion of the duodenum.                           - An intramural (subepithelial) lesion was found in                            the greater curve of the stomach and in the body of                            the stomach. The lesion appeared to originate from                            within the mucosa. A tissue diagnosis was obtained                            prior to this exam. This is of adenocarcinoma.                           - Staging gastric adenocarcinoma at least T2 (maybe                            early T3) NO Mx by EUS. Moderate Sedation:      None Recommendation:           - Discharge patient to home (via wheelchair).                           - Soft diet today.                           - Return to referring physician at appointment to  be scheduled. Procedure Code(s):        --- Professional ---                           [phone], Esophagogastroduodenoscopy, flexible,                            transoral; with endoscopic ultrasound examination                            limited to the esophagus, stomach or duodenum, and                            adjacent structures                           43236, 59, Esophagogastroduodenoscopy, flexible,                            transoral; with directed submucosal injection(s),                            any substance Diagnosis Code(s):        --- Professional ---                           K25.9, Gastric ulcer, unspecified as acute or                            chronic, without hemorrhage or perforation                           C16.6, Malignant neoplasm of greater curvature of                            stomach, unspecified                           C16.2, Malignant neoplasm of body of stomach CPT copyright 2022 American Medical Association. All rights reserved. The codes documented in this report are preliminary and upon coder review may  be revised to meet current compliance  requirements. Elsie Cree, MD 06/15/2024 11:47:38 AM This report has been signed electronically. Number of Addenda: 0

## 2024-06-15 NOTE — Anesthesia Procedure Notes (Signed)
 Procedure Name: MAC Date/Time: 06/15/2024 11:02 AM  Performed by: Gladis Honey, CRNAPre-anesthesia Checklist: Patient identified, Emergency Drugs available, Suction available and Patient being monitored Oxygen  Delivery Method: Simple face mask Airway Equipment and Method: Bite block Placement Confirmation: positive ETCO2 Dental Injury: Teeth and Oropharynx as per pre-operative assessment

## 2024-06-15 NOTE — Discharge Instructions (Signed)
Endoscopy °Care After °Please read the instructions outlined below and refer to this sheet in the next few weeks. These discharge instructions provide you with general information on caring for yourself after you leave the hospital. Your doctor may also give you specific instructions. While your treatment has been planned according to the most current medical practices available, unavoidable complications occasionally occur. If you have any problems or questions after discharge, please call Dr. Anderson Coppock ([facility]) at [phone]. ° °HOME CARE INSTRUCTIONS °Activity °· You may resume your regular activity but move at a slower pace for the next 24 hours.  °· Take frequent rest periods for the next 24 hours.  °· Walking will help expel (get rid of) the air and reduce the bloated feeling in your abdomen.  °· No driving for 24 hours (because of the anesthesia (medicine) used during the test).  °· You may shower.  °· Do not sign any important legal documents or operate any machinery for 24 hours (because of the anesthesia used during the test).  °Nutrition °· Drink plenty of fluids.  °· You may resume your normal diet.  °· Begin with a light meal and progress to your normal diet.  °· Avoid alcoholic beverages for 24 hours or as instructed by your caregiver.  °Medications °You may resume your normal medications unless your caregiver tells you otherwise. °What you can expect today °· You may experience abdominal discomfort such as a feeling of fullness or "gas" pains.  °· You may experience a sore throat for 2 to 3 days. This is normal. Gargling with salt water may help this.  °·  °SEEK IMMEDIATE MEDICAL CARE IF: °· You have excessive nausea (feeling sick to your stomach) and/or vomiting.  °· You have severe abdominal pain and distention (swelling).  °· You have trouble swallowing.  °· You have a temperature over 100° F (37.8° C).  °· You have rectal bleeding or vomiting of blood.  °Document Released:  07/01/2004 Document Revised: 07/30/2011 Document Reviewed: 01/12/2008 °ExitCare® Patient Information ©2012 [employer]. °

## 2024-06-15 NOTE — Transfer of Care (Signed)
 Immediate Anesthesia Transfer of Care Note  Patient: Raymond Barrera.  Procedure(s) Performed: ULTRASOUND, UPPER GI TRACT, ENDOSCOPIC EGD (ESOPHAGOGASTRODUODENOSCOPY)  Patient Location: PACU  Anesthesia Type:MAC  Level of Consciousness: drowsy  Airway & Oxygen  Therapy: Patient Spontanous Breathing and Patient connected to face mask oxygen   Post-op Assessment: Report given to RN and Post -op Vital signs reviewed and stable  Post vital signs: Reviewed and stable  Last Vitals:  Vitals Value Taken Time  BP 156/95 06/15/24 11:42  Temp    Pulse 63 06/15/24 11:43  Resp 20 06/15/24 11:43  SpO2 100 % 06/15/24 11:43  Vitals shown include unfiled device data.  Last Pain:  Vitals:   06/15/24 0955  TempSrc: Temporal  PainSc: 0-No pain         Complications: No notable events documented.

## 2024-06-15 NOTE — Interval H&P Note (Signed)
 History and Physical Interval Note:  06/15/2024 11:00 AM  Raymond Barrera.  has presented today for surgery, with the diagnosis of gastric adenocarcinoma, C16.0.  The various methods of treatment have been discussed with the patient and family. After consideration of risks, benefits and other options for treatment, the patient has consented to  Procedure(s) with comments: ULTRASOUND, UPPER GI TRACT, ENDOSCOPIC (N/A) - fine needle aspiration as a surgical intervention.  The patient's history has been reviewed, patient examined, no change in status, stable for surgery.  I have reviewed the patient's chart and labs.  Questions were answered to the patient's satisfaction.     Raymond Barrera

## 2024-06-15 NOTE — Anesthesia Postprocedure Evaluation (Signed)
 Anesthesia Post Note  Patient: Raymond Barrera.  Procedure(s) Performed: ULTRASOUND, UPPER GI TRACT, ENDOSCOPIC EGD (ESOPHAGOGASTRODUODENOSCOPY)     Patient location during evaluation: Endoscopy Anesthesia Type: MAC Level of consciousness: awake and alert Pain management: pain level controlled Vital Signs Assessment: post-procedure vital signs reviewed and stable Respiratory status: spontaneous breathing, nonlabored ventilation, respiratory function stable and patient connected to nasal cannula oxygen  Cardiovascular status: blood pressure returned to baseline and stable Postop Assessment: no apparent nausea or vomiting Anesthetic complications: no   No notable events documented.  Last Vitals:  Vitals:   06/15/24 1150 06/15/24 1200  BP: (!) 158/103 (!) 167/92  Pulse: 67 68  Resp: 20 (!) 26  Temp:    SpO2: 100% 97%    Last Pain:  Vitals:   06/15/24 1200  TempSrc:   PainSc: 0-No pain                 Garnette DELENA Gab

## 2024-06-16 ENCOUNTER — Encounter (HOSPITAL_COMMUNITY): Payer: Self-pay | Admitting: Gastroenterology

## 2024-06-16 ENCOUNTER — Telehealth: Payer: Self-pay

## 2024-06-16 ENCOUNTER — Ambulatory Visit: Admitting: Oncology

## 2024-06-16 NOTE — Telephone Encounter (Signed)
 Patient scheduled 8/29 MyChart Visit w/ Raymond Barrera.

## 2024-06-16 NOTE — Telephone Encounter (Signed)
 I called and spoke to pt's wife, Dianne. (DPR) Diane states they are going this afternoon to get his CPAP machine at [facility]. I asked Diane if there was anything specific that the pt wanted to discuss with Ms Hope tomorrow and Diane stated No. I informed Diane that we would need to cancel tomorrow's appt and have him scheduled for a month ahead so the pt has time to gain a compliance report. Diane verbalized understanding. Routing to the front desk for cancelling and rescheduling. Preferably late August to early September. NFN

## 2024-06-17 ENCOUNTER — Ambulatory Visit: Admitting: Primary Care

## 2024-06-21 ENCOUNTER — Ambulatory Visit: Payer: Self-pay

## 2024-06-21 NOTE — Telephone Encounter (Signed)
 FYI Only or Action Required?: FYI only for provider.  Patient was last seen in primary care on 04/29/2024 by [name], MD.  Called Nurse Triage reporting Dizziness.  Symptoms began several days ago.  Interventions attempted: Nothing.  Symptoms are: gradually worsening.  Triage Disposition: See Physician Within 24 Hours  Patient/caregiver understands and will follow disposition?: YesCopied from CRM [id number]. Topic: Clinical - Red Word Triage >> Jun 21, 2024 11:04 AM Sasha H wrote: Red Word that prompted transfer to Nurse Triage: [name] called in stating pt has been very unbalanced and dizzy for 3-4 days and he can't do anything because of it. She is recovering from knee replacement and is trying to help him as much as she can but it is hard and she is wanting advice Reason for Disposition  [1] MODERATE dizziness (e.g., vertigo; feels very unsteady, interferes with normal activities) AND [2] has NOT been evaluated by doctor (or NP/PA) for this  Answer Assessment - Initial Assessment Questions 1. DESCRIPTION: Describe your dizziness.     Just uncomfortable  2. VERTIGO: Do you feel like either you or the room is spinning or tilting?      Room spinning  3. LIGHTHEADED: Do you feel lightheaded? (e.g., somewhat faint, woozy, weak upon standing)     yes 4. SEVERITY: How bad is it?  Can you walk?     Very unsteady  5. ONSET:  When did the dizziness begin?     4 days ago  6. AGGRAVATING FACTORS: Does anything make it worse? (e.g., standing, change in head position)     Movement  7. CAUSE: What do you think is causing the dizziness?     unsure 8. RECURRENT SYMPTOM: Have you had dizziness before? If Yes, ask: When was the last time? What happened that time?     yes 9. OTHER SYMPTOMS: Do you have any other symptoms? (e.g., earache, headache, numbness, tinnitus, vomiting, weakness)     Headache, lower back pain.    Pt has been taking tylenol  to treat  headache. Dizziness is creating  Unsteadiness. No changes in medication. Pt just started being dizzy out of no where. No falls but wife stated there have been several close calls. RN advised pt to UC today. No office appts until 8/1. Wife stated they will go today.  Protocols used: Dizziness - Vertigo-A-AH

## 2024-06-22 ENCOUNTER — Inpatient Hospital Stay: Attending: Oncology | Admitting: Oncology

## 2024-06-22 VITALS — BP 135/89 | HR 73 | Temp 97.8°F | Resp 19 | Ht 78.0 in | Wt 236.5 lb

## 2024-06-22 DIAGNOSIS — K116 Mucocele of salivary gland: Secondary | ICD-10-CM | POA: Insufficient documentation

## 2024-06-22 DIAGNOSIS — G6289 Other specified polyneuropathies: Secondary | ICD-10-CM | POA: Diagnosis not present

## 2024-06-22 DIAGNOSIS — C641 Malignant neoplasm of right kidney, except renal pelvis: Secondary | ICD-10-CM | POA: Diagnosis not present

## 2024-06-22 DIAGNOSIS — Z905 Acquired absence of kidney: Secondary | ICD-10-CM | POA: Insufficient documentation

## 2024-06-22 DIAGNOSIS — K5900 Constipation, unspecified: Secondary | ICD-10-CM | POA: Diagnosis not present

## 2024-06-22 DIAGNOSIS — R221 Localized swelling, mass and lump, neck: Secondary | ICD-10-CM | POA: Insufficient documentation

## 2024-06-22 DIAGNOSIS — E1142 Type 2 diabetes mellitus with diabetic polyneuropathy: Secondary | ICD-10-CM | POA: Diagnosis not present

## 2024-06-22 DIAGNOSIS — R220 Localized swelling, mass and lump, head: Secondary | ICD-10-CM

## 2024-06-22 DIAGNOSIS — D500 Iron deficiency anemia secondary to blood loss (chronic): Secondary | ICD-10-CM | POA: Diagnosis not present

## 2024-06-22 DIAGNOSIS — C169 Malignant neoplasm of stomach, unspecified: Secondary | ICD-10-CM | POA: Insufficient documentation

## 2024-06-22 DIAGNOSIS — E1140 Type 2 diabetes mellitus with diabetic neuropathy, unspecified: Secondary | ICD-10-CM | POA: Insufficient documentation

## 2024-06-22 DIAGNOSIS — Z79899 Other long term (current) drug therapy: Secondary | ICD-10-CM | POA: Diagnosis not present

## 2024-06-22 DIAGNOSIS — Z720 Tobacco use: Secondary | ICD-10-CM | POA: Diagnosis not present

## 2024-06-22 NOTE — Patient Instructions (Addendum)
 [facility]  Discharge Instructions  You were seen and examined today by Dr. Davonna to review your recent results.  You have been diagnosed with gastric (stomach) cancer. It is localized disease meaning that it is ideally curative. This is typically treated with chemotherapy, immunotherapy and surgery.  You will be referred to [facility] to ensure that you are a surgical candidate.  You will start chemotherapy first. Prior to this, we will arrange for you to have a Port-A-Cath placed and chemotherapy teaching session. Due to your neuropathy, Dr. Davonna will search for alternative therapies that do not worsen neuropathy.  Follow-up as scheduled.  Thank you for choosing [name] to provide your oncology and hematology care.   To afford each patient quality time with our provider, please arrive at least 15 minutes before your scheduled appointment time. You may need to reschedule your appointment if you arrive late (10 or more minutes). Arriving late affects you and other patients whose appointments are after yours.  Also, if you miss three or more appointments without notifying the office, you may be dismissed from the clinic at the provider's discretion.    Again, thank you for choosing [facility].  Our hope is that these requests will decrease the amount of time that you wait before being seen by our physicians.   If you have a lab appointment with the Cancer Center - please note that after April 8th, all labs will be drawn in the cancer center.  You do not have to check in or register with the main entrance as you have in the past but will complete your check-in at the cancer center.            _____________________________________________________________  Should you have questions after your visit to [facility], please contact our office at [phone] and follow the prompts.  Our office hours are  8:00 a.m. to 4:30 p.m. Monday - Thursday and 8:00 a.m. to 2:30 p.m. Friday.  Please note that voicemails left after 4:00 p.m. may not be returned until the following business day.  We are closed weekends and all major holidays.  You do have access to a nurse 24-7, just call the main number to the clinic [phone] and do not press any options, hold on the line and a nurse will answer the phone.    For prescription refill requests, have your pharmacy contact our office and allow 72 hours.    Masks are no longer required in the cancer centers. If you would like for your care team to wear a mask while they are taking care of you, please let them know. You may have one support person who is at least 65 years old accompany you for your appointments.

## 2024-06-22 NOTE — Progress Notes (Signed)
 Patient Care Team: [name], MD as PCP - General (Internal Medicine) Francyne Headland, MD as PCP - Cardiology (Cardiology) Beuford Anes, MD as Consulting Physician (Orthopedic Surgery) Devere Lonni Righter, MD as Consulting Physician (Urology) Skeet Juliene SAUNDERS, DO as Consulting Physician (Neurology)  Clinic Day:  06/26/2024  Referring physician: [name], MD   CHIEF COMPLAINT:  CC: Gastric adenocarcinoma    ASSESSMENT & PLAN:   Assessment & Plan: Raymond Barrera.  is a 63 y.o. male with Gastric adenocarcinoma   Assessment & Plan Malignant neoplasm of stomach, unspecified location [facility]) Newly diagnosed gastric adenocarcinoma via biopsy of ulcer base on enteroscopy.  Oncology history as below Likely stage I-T2N0M0 PET scan with no evidence of metastatic disease   -Discussed with patient that this stage of gastric adenocarcinoma usually requires perioperative chemotherapy with D-FLOT regimen per [facility] trial which showed significant PFS benefit. -Considering patient's peripheral neuropathy, would likely dose reduce oxaliplatin after a short period of trial. - Will refer to surgical oncology for evaluation and if upfront surgery is even an option for the patient.  Will send a referral to [facility]. -Discussed risk versus benefits of port placement and will get it scheduled - Will have a chemo education session scheduled considering patient would require chemotherapy either perioperatively or postsurgically.  Return to clinic after surgical evaluation for discussion of further management. Renal cell carcinoma of right kidney [facility]) Patient has a history of left kidney renal cell carcinoma s/p nephrectomy in 2020 Stage pT1a with no metastasis.  - Currently in remission - Unknown if patient follows with urology at this time Iron  deficiency anemia due to chronic blood loss Iron  deficiency anemia likely secondary to bleeding from stomach  cancer.  Improved with iron  supplementation. S/p 1 g Monoferric on 05/10/2024 Symptomatically feels significantly better  - Continue taking iron  pills every other day.  Use MiraLAX  for constipation Other polyneuropathy Patient has significant peripheral neuropathy in his hands secondary to diabetes. Has some difficulty holding his phone in his right hand  - Will have caution while administering platinum based chemotherapy. - Continue taking gabapentin  and follow-up with primary care Tobacco use Patient is a chronic smoker and has recently stopped smoking for the past 5 days  - Encouraged his attempts to abstain from smoking Submandibular swelling Patient has submandibular swelling on the left side  Ultrasound showed a complex cyst.  -Repeat ultrasound in 3 to 6 months to assess for stability     The patient understands the plans discussed today and is in agreement with them.  He knows to contact our office if he develops concerns prior to his next appointment.  I provided 30 minutes of face-to-face time during this encounter and > 50% was spent counseling as documented under my assessment and plan.    [name], MD  [facility] [place] - A DEPT OF [name] [address] [place]  [place] [address] Dept: [phone] Dept Fax: [phone]   Orders Placed This Encounter  Procedures   IR IMAGING GUIDED PORT INSERTION    Standing Status:   Future    Number of Occurrences:   1    Expected Date:   06/23/2024    Expiration Date:   06/22/2025    Reason for Exam (SYMPTOM  OR DIAGNOSIS REQUIRED):   chemotherapy administration    Preferred Imaging Location?:   [facility]    Release to patient:  Immediate   Ambulatory referral to Social Work    Referral Priority:   Routine    Referral Type:   Consultation    Referral Reason:   Specialty Services Required    Number of Visits Requested:   1   Ambulatory Referral  to [facility]    Referral Priority:   Routine    Referral Type:   Consultation    Referral Reason:   Specialty Services Required    Number of Visits Requested:   1     ONCOLOGY HISTORY:   Oncology History  Gastric adenocarcinoma (HCC)  04/19/2024 Pathology Results   A. STOMACH, ABNORMAL MUCOSA BIOPSY:   Invasive moderately differentiated adenocarcinoma  Reactive gastropathy with intestinal metaplasia showing focal dysplasia  Negative for H. pylori    04/29/2024 Initial Diagnosis   Gastric adenocarcinoma (HCC)   05/11/2024 Pathology Results   [facility]is tissue testing:  CLDN18: Positive PD-L1: Positive, CPS: 10 HER2: Negative  MSI: Stable, MMR: Proficient, TMB: Low, 5 mut/Mb  BRAF, RET, NTRK 1/2/3, ARID1A, BCR, CCN E1, CDH 1, EBER, EGFR, FGFR 2, KRAS, MET, PIK3CA, RHOA: Negative   05/19/2024 PET scan   IMPRESSION: Only slight area of asymmetric uptake along the midbody of the stomach with an adjacent metallic clip. Please correlate for the exact location patient's ulcer and neoplasm as reported on endoscopy. No additional areas of abnormal uptake at this time.   06/15/2024 Procedure   EUS for staging:  Staging gastric adenocarcinoma at least T2 (maybe early T3) N0 Mx by EUS.   Renal cell carcinoma (HCC)  03/30/2019 Cancer Staging   Staging form: Kidney, AJCC 8th Edition - Clinical stage from 03/30/2019: Stage I (cT1a, cN0, cM0) - Signed by Davonna Siad, MD on 05/03/2024 Stage prefix: Initial diagnosis   05/03/2024 Initial Diagnosis   Renal cell carcinoma (HCC)      Current Treatment:  TBD  INTERVAL HISTORY:  Raymond Barrera. is here today for follow up. Patient is accompanied by his wife and mother today.  He reported significantly feeling better after IV iron  infusion.  He had an EUS done recently and tolerated it well.  He has no complaints today.  He reported feeling numbness and tingling in his fingers particularly affecting his right hand, which impacts his  ability to hold utensils.  He is taking gabapentin  at bedtime for this.  Patient has stopped smoking and has been smoke-free for 5 days.  I have reviewed the past medical history, past surgical history, social history and family history with the patient and they are unchanged from previous note.  ALLERGIES:  is allergic to poison ivy extract [poison ivy extract] and coreg  [carvedilol ].  MEDICATIONS:  Current Outpatient Medications  Medication Sig Dispense Refill   Accu-Chek Softclix Lancets lancets SMARTSIG:Topical 2-3 Times Daily     acetaminophen  (TYLENOL ) 325 MG tablet Take 2 tablets (650 mg total) by mouth every 6 (six) hours as needed for mild pain (pain score 1-3) (or Fever >/= 101).     albuterol  (VENTOLIN  HFA) 108 (90 Base) MCG/ACT inhaler Inhale 2 puffs into the lungs every 6 (six) hours as needed for wheezing or shortness of breath (cough). 8 g 2   amLODipine  (NORVASC ) 2.5 MG tablet Take 1 tablet (2.5 mg total) by mouth daily. 90 tablet 3   aspirin  EC 81 MG tablet Take 1 tablet (81 mg total) by mouth daily with breakfast. Swallow whole. 30 tablet 12   atorvastatin  (LIPITOR) 40 MG tablet Take 1 tablet (40 mg  total) by mouth daily. 90 tablet 3   Camphor-Menthol -Methyl Sal (SALONPAS EX) Apply topically. As needed     clopidogrel  (PLAVIX ) 75 MG tablet Take 1 tablet (75 mg total) by mouth daily.     cyanocobalamin  (VITAMIN B12) 1000 MCG tablet Take 1,000 mcg by mouth daily.     DULoxetine  (CYMBALTA ) 30 MG capsule Take 1 capsule (30 mg total) by mouth 2 (two) times daily. 60 capsule 3   ferrous sulfate  325 (65 FE) MG EC tablet Take 1 tablet (325 mg total) by mouth every Tuesday, Thursday, Saturday, and Sunday. 45 tablet 3   gabapentin  (NEURONTIN ) 300 MG capsule Take 1 capsule (300 mg total) by mouth at bedtime. 90 capsule 1   JARDIANCE  10 MG TABS tablet Take 1 tablet (10 mg total) by mouth daily. 30 tablet 3   pantoprazole  (PROTONIX ) 40 MG tablet Take 1 tablet (40 mg total) by mouth 2  (two) times daily. 60 tablet 2   polyethylene glycol powder (GLYCOLAX /MIRALAX ) 17 GM/SCOOP powder Take 8.5 g by mouth daily.     rOPINIRole  (REQUIP ) 2 MG tablet Take 1 tablet (2 mg total) by mouth at bedtime. 30 tablet 1   umeclidinium-vilanterol (ANORO ELLIPTA ) 62.5-25 MCG/ACT AEPB Inhale 1 puff into the lungs daily. 60 each 3   No current facility-administered medications for this visit.    REVIEW OF SYSTEMS:   Constitutional: Denies fevers, chills or abnormal weight loss Eyes: Denies blurriness of vision Ears, nose, mouth, throat, and face: Denies mucositis or sore throat Respiratory: Denies cough, dyspnea or wheezes Cardiovascular: Denies palpitation, chest discomfort or lower extremity swelling Gastrointestinal:  Denies nausea, heartburn or change in bowel habits Skin: Denies abnormal skin rashes Lymphatics: Denies new lymphadenopathy or easy bruising Neurological:Denies numbness, tingling or new weaknesses Behavioral/Psych: Mood is stable, no new changes  All other systems were reviewed with the patient and are negative.   VITALS:  Blood pressure 135/89, pulse 73, temperature 97.8 F (36.6 C), temperature source Oral, resp. rate 19, height 6' 6 (1.981 m), weight 236 lb 8 oz (107.3 kg), SpO2 99%.  Wt Readings from Last 3 Encounters:  06/23/24 235 lb (106.6 kg)  06/22/24 236 lb 8 oz (107.3 kg)  06/15/24 238 lb (108 kg)    Body mass index is 27.33 kg/m.  Performance status (ECOG): 1 - Symptomatic but completely ambulatory  PHYSICAL EXAM:   GENERAL:alert, no distress and comfortable SKIN: skin color, texture, turgor are normal, no rashes or significant lesions OROPHARYNX: Swelling in the left submandibular area, soft-improved NECK: supple, thyroid  normal size, non-tender, without nodularity LYMPH:  no palpable lymphadenopathy in the cervical, axillary or inguinal LUNGS: clear to auscultation and percussion with normal breathing effort HEART: regular rate & rhythm and no  murmurs and no lower extremity edema ABDOMEN:abdomen soft, non-tender and normal bowel sounds Musculoskeletal:no cyanosis of digits and no clubbing  NEURO: alert & oriented x 3 with fluent speech  LABORATORY DATA:  I have reviewed the data as listed    Component Value Date/Time   NA 137 05/27/2024 1350   NA 139 01/26/2024 1620   K 4.6 05/27/2024 1350   CL 104 05/27/2024 1350   CO2 22 05/27/2024 1350   GLUCOSE 120 (H) 05/27/2024 1350   BUN 27 (H) 05/27/2024 1350   BUN 28 (H) 01/26/2024 1620   CREATININE 1.88 (H) 05/27/2024 1350   CALCIUM  11.3 (H) 05/27/2024 1350   PROT 7.8 05/27/2024 1350   PROT 7.2 01/01/2024 1012   ALBUMIN  3.9 05/27/2024 1350  ALBUMIN  4.4 01/01/2024 1012   AST 20 05/27/2024 1350   ALT 32 05/27/2024 1350   ALKPHOS 92 05/27/2024 1350   BILITOT 0.9 05/27/2024 1350   BILITOT <0.2 01/01/2024 1012   GFRNONAA 39 (L) 05/27/2024 1350   GFRAA 40 (L) 04/12/2020 1425    Lab Results  Component Value Date   WBC 8.9 05/27/2024   NEUTROABS 7.1 05/27/2024   HGB 12.7 (L) 05/27/2024   HCT 40.5 05/27/2024   MCV 85.8 05/27/2024   PLT 293 05/27/2024      Chemistry      Component Value Date/Time   NA 137 05/27/2024 1350   NA 139 01/26/2024 1620   K 4.6 05/27/2024 1350   CL 104 05/27/2024 1350   CO2 22 05/27/2024 1350   BUN 27 (H) 05/27/2024 1350   BUN 28 (H) 01/26/2024 1620   CREATININE 1.88 (H) 05/27/2024 1350      Component Value Date/Time   CALCIUM  11.3 (H) 05/27/2024 1350   ALKPHOS 92 05/27/2024 1350   AST 20 05/27/2024 1350   ALT 32 05/27/2024 1350   BILITOT 0.9 05/27/2024 1350   BILITOT <0.2 01/01/2024 1012       RADIOGRAPHIC STUDIES: I have personally reviewed the radiological images as listed and agreed with the findings in the report.  US  Soft Tissue Head/Neck Result Date: 06/06/2024 CLINICAL DATA:  Left submandibular swelling EXAM: ULTRASOUND OF HEAD/NECK SOFT TISSUES TECHNIQUE: Ultrasound examination of the head and neck soft tissues was  performed in the area of clinical concern. COMPARISON:  None Available. FINDINGS: Sonographic interrogation the region of clinical concern in the left submandibular gland demonstrates a small rounded mixed cystic and solid structure with posterior acoustic enhancement measuring 0.8 x 0.7 cm. No evidence of internal vascularity. This appears to represent a small complex cyst. Additional evaluation the left lateral neck identifies a normal sized and normal appearing lymph node measuring only 0.4 cm in short axis. Finally, evaluation of the soft tissues at the base of the neck identifies a oblong fatty mass measuring 4.7 x 2.6 x 4.6 cm. The echogenicity is similar to that of the adjacent adipose tissue. No evidence of internal vascularity. This finding is likely a benign lipoma. IMPRESSION: 1. Small subcentimeter complex cyst within the left submandibular gland. No evidence of internal vascularity. Consider follow-up ultrasound in 3-6 months to assess for stability. 2. 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 3. No suspicious lymphadenopathy. Electronically Signed   By: Wilkie Lent M.D.   On: 06/06/2024 07:28

## 2024-06-23 ENCOUNTER — Ambulatory Visit (HOSPITAL_COMMUNITY)
Admission: RE | Admit: 2024-06-23 | Discharge: 2024-06-23 | Disposition: A | Discharge status: Home / Self Care | Source: Ambulatory Visit | Attending: Oncology | Admitting: Oncology

## 2024-06-23 ENCOUNTER — Other Ambulatory Visit: Payer: Self-pay

## 2024-06-23 ENCOUNTER — Other Ambulatory Visit (HOSPITAL_COMMUNITY): Payer: Self-pay | Admitting: Student

## 2024-06-23 ENCOUNTER — Encounter: Payer: Self-pay | Admitting: Hematology

## 2024-06-23 DIAGNOSIS — Z8673 Personal history of transient ischemic attack (TIA), and cerebral infarction without residual deficits: Secondary | ICD-10-CM | POA: Diagnosis not present

## 2024-06-23 DIAGNOSIS — I110 Hypertensive heart disease with heart failure: Secondary | ICD-10-CM | POA: Diagnosis not present

## 2024-06-23 DIAGNOSIS — Z79899 Other long term (current) drug therapy: Secondary | ICD-10-CM | POA: Insufficient documentation

## 2024-06-23 DIAGNOSIS — Z7984 Long term (current) use of oral hypoglycemic drugs: Secondary | ICD-10-CM | POA: Diagnosis not present

## 2024-06-23 DIAGNOSIS — F17210 Nicotine dependence, cigarettes, uncomplicated: Secondary | ICD-10-CM | POA: Insufficient documentation

## 2024-06-23 DIAGNOSIS — I509 Heart failure, unspecified: Secondary | ICD-10-CM | POA: Insufficient documentation

## 2024-06-23 DIAGNOSIS — E119 Type 2 diabetes mellitus without complications: Secondary | ICD-10-CM | POA: Diagnosis not present

## 2024-06-23 DIAGNOSIS — C169 Malignant neoplasm of stomach, unspecified: Secondary | ICD-10-CM | POA: Insufficient documentation

## 2024-06-23 HISTORY — PX: IR IMAGING GUIDED PORT INSERTION: IMG5740

## 2024-06-23 LAB — GLUCOSE, CAPILLARY
Glucose-Capillary: 114 mg/dL — ABNORMAL HIGH (ref 70–99)
Glucose-Capillary: 117 mg/dL — ABNORMAL HIGH (ref 70–99)

## 2024-06-23 MED ORDER — FENTANYL CITRATE (PF) 100 MCG/2ML IJ SOLN
INTRAMUSCULAR | Status: AC | PRN
  Administered 2024-06-23: 25 ug via INTRAVENOUS
  Administered 2024-06-23: 50 ug via INTRAVENOUS

## 2024-06-23 MED ORDER — LIDOCAINE-EPINEPHRINE 1 %-1:100000 IJ SOLN
INTRAMUSCULAR | Status: AC
Start: 2024-06-23 — End: 2024-06-23
  Filled 2024-06-23: qty 1

## 2024-06-23 MED ORDER — LIDOCAINE HCL 1 % IJ SOLN
INTRAMUSCULAR | Status: AC
  Filled 2024-06-23: qty 20

## 2024-06-23 MED ORDER — FENTANYL CITRATE (PF) 100 MCG/2ML IJ SOLN
INTRAMUSCULAR | Status: AC
  Filled 2024-06-23: qty 2

## 2024-06-23 MED ORDER — LIDOCAINE HCL (PF) 1 % IJ SOLN
10.0000 mL | Freq: Once | INTRAMUSCULAR | Status: AC
  Administered 2024-06-23: 10 mL

## 2024-06-23 MED ORDER — SODIUM CHLORIDE 0.9 % IV SOLN: INTRAVENOUS | Status: DC

## 2024-06-23 MED ORDER — HEPARIN SOD (PORK) LOCK FLUSH 100 UNIT/ML IV SOLN
INTRAVENOUS | Status: AC
  Filled 2024-06-23: qty 5

## 2024-06-23 MED ORDER — MIDAZOLAM HCL 2 MG/2ML IJ SOLN
INTRAMUSCULAR | Status: AC
  Filled 2024-06-23: qty 2

## 2024-06-23 MED ORDER — MIDAZOLAM HCL 2 MG/2ML IJ SOLN
INTRAMUSCULAR | Status: AC | PRN
Start: 2024-06-23 — End: 2024-06-23
  Administered 2024-06-23: 1 mg via INTRAVENOUS

## 2024-06-23 MED ORDER — LIDOCAINE-EPINEPHRINE (PF) 1 %-1:200000 IJ SOLN
10.0000 mL | Freq: Once | INTRAMUSCULAR | Status: AC
  Administered 2024-06-23: 10 mL

## 2024-06-23 NOTE — Procedures (Signed)
 Interventional Radiology Procedure:   Indications: Gastric cancer  Procedure: Port placement  Findings: Right jugular port, tip at SVC/RA junction.    Complications: None     EBL: Minimal, less than 10 ml  Plan: Discharge in one hour.  Keep port site and incisions dry for at least 24 hours.     Ernesteen Mihalic R. Philip, MD  Pager: [phone]

## 2024-06-23 NOTE — Progress Notes (Signed)
 Patient and wife was given discharge instructions. Both verbalized understanding.

## 2024-06-23 NOTE — H&P (Signed)
 Chief Complaint: Patient was seen in consultation today for gastric adenocarcinoma.    Referring Physician(s): Davonna Siad  Supervising Physician: Philip Cornet  Patient Status: [facility] - Out-pt  History of Present Illness: Raymond Barrera. is a 63 y.o. male with a medical history significant for CVA, heart failure, DM2, HTN, GI bleed and recently diagnosed stomach cancer. He initially presented to the ED March 2025 with dizziness, nausea and fall. His hemoglobin was 6.9. Given his history of prior GIB he was taken to endoscopy where he was found to have erosive gastropathy with no stigmata of recent bleeding. A colonoscopy was also performed and this revealed several polyps. All biopsies obtained were negative for malignancy. A capsule endoscopy was negative for active bleeding or old blood.   He was discharged with outpatient GI follow up and a push enteroscopy was performed 04/19/24. A gastric ulcer was identified and biopsies were taken. Pathology was positive for adenocarcinoma. His oncology team is preparing him for systemic therapy and he will require durable venous access.   Interventional Radiology has been asked to evaluate this patient for an image-guided port-a-catheter placement.     Past Medical History:  Diagnosis Date   Ambulates with cane    straight - uses occasionally   Anxiety    due to the stroke   Arthritis    Back pain    hx of buldging disc   Complication of anesthesia    took a while for him to wake up after previous anesthesia   CVA (cerebral vascular accident) (HCC) 10/07/2020   Diabetes mellitus without complication (HCC)    Family history of adverse reaction to anesthesia    sometimes mom has a hard time waking up   High cholesterol    takes Zocor  daily   Hypertension    takes Benazepril  and HCTZ  daily   Joint pain    Joint swelling    Memory impairment    occassional - from stroke   Myocardial infarction (HCC) 1987   Pneumonia    hx  of-80's   Shortness of breath dyspnea    do to pain   Sleep apnea    never had a sleep study,but states Dr. Rennis says he has it   Slurred speech    Stroke (HCC) 08/2013   7 mini-strokes, last stroke 2017   TIA (transient ischemic attack) 2014   x 7    Urinary frequency     Past Surgical History:  Procedure Laterality Date   ABDOMINAL EXPOSURE N/A 06/16/2018   Procedure: ABDOMINAL EXPOSURE;  Surgeon: Oris Krystal FALCON, MD;  Location[facility] OR;  Service: Vascular;  Laterality: N/A;   ANKLE SURGERY  2008   left ankle-otif-[facility]   ANTERIOR LUMBAR FUSION Bilateral 06/16/2018   Procedure: LUMBAR 4-5 LUMBAR 5-SACRUM 1 ANTERIOR LUMBAR INTERBODY FUSION WITH INSTRUMENTATION AND ALLOGRAFT;  Surgeon: Beuford Anes, MD;  Location: [facility] OR;  Service: Orthopedics;  Laterality: Bilateral;   BACK SURGERY     COLONOSCOPY N/A 03/01/2024   Procedure: COLONOSCOPY;  Surgeon: Eartha Angelia Sieving, MD;  Location: [facility];  Service: Gastroenterology;  Laterality: N/A;   ENTEROSCOPY N/A 04/19/2024   Procedure: ENTEROSCOPY;  Surgeon: Cinderella Deatrice FALCON, MD;  Location: [facility];  Service: Endoscopy;  Laterality: N/A;  9:0AM;ASA 3   ESOPHAGOGASTRODUODENOSCOPY N/A 03/01/2024   Procedure: EGD (ESOPHAGOGASTRODUODENOSCOPY);  Surgeon: Eartha Angelia, Sieving, MD;  Location: [facility];  Service: Gastroenterology;  Laterality: N/A;   ESOPHAGOGASTRODUODENOSCOPY N/A 06/15/2024   Procedure:  EGD (ESOPHAGOGASTRODUODENOSCOPY);  Surgeon: Burnette Fallow, MD;  Location: [facility];  Service: Gastroenterology;  Laterality: N/A;   EUS N/A 06/15/2024   Procedure: ULTRASOUND, UPPER GI TRACT, ENDOSCOPIC;  Surgeon: Burnette Fallow, MD;  Location: [facility];  Service: Gastroenterology;  Laterality: N/A;  fine needle aspiration   GIVENS CAPSULE STUDY N/A 03/02/2024   Procedure: IMAGING PROCEDURE, GI TRACT, INTRALUMINAL, VIA CAPSULE;  Surgeon: Cindie Carlin POUR, DO;  Location: [facility];  Service: Endoscopy;  Laterality:  N/A;   HEMATOMA EVACUATION Left 08/13/2018   Procedure: EVACUATION HEMATOMA LEFT ABDOMINAL WALL;  Surgeon: Oris Krystal FALCON, MD;  Location: [facility] OR;  Service: Vascular;  Laterality: Left;   HEMOSTASIS CLIP PLACEMENT  03/01/2024   Procedure: CONTROL OF HEMORRHAGE, GI TRACT, ENDOSCOPIC, BY CLIPPING OR OVERSEWING;  Surgeon: Eartha Angelia Sieving, MD;  [facility]: [facility];  Service: Gastroenterology;;   IR RADIOLOGIST EVAL & MGMT  07/27/2018   IR US  GUIDE BX ASP/DRAIN  07/14/2018   JOINT REPLACEMENT     both hips replaced    LUMBAR LAMINECTOMY/DECOMPRESSION MICRODISCECTOMY Right 10/17/2014   Procedure: LUMBAR LAMINECTOMY/DECOMPRESSION MICRODISCECTOMY 2 LEVELS;  Surgeon: Gerldine Maizes, MD;  Location: [facility];  Service: Neurosurgery;  Laterality: Right;  Right L45 L5S1 laminectomy and foraminotomy   MASS EXCISION  09/13/2012   Procedure: EXCISION MASS;  Surgeon: Oneil DELENA Budge, MD;  Location: [facility] ORS;  Service: General;  Laterality: N/A;   POLYPECTOMY  03/01/2024   Procedure: POLYPECTOMY;  Surgeon: Eartha Angelia Sieving, MD;  [facility];  Service: Gastroenterology;;   ROBOTIC ASSITED PARTIAL NEPHRECTOMY Left 03/30/2019   Procedure: XI ROBOTIC ASSITED PARTIAL NEPHRECTOMY POSSIBLE RADICAL NEPHRECTOMY;  Surgeon: Devere Lonni Righter, MD;  Location: [facility] ORS;  Service: Urology;  Laterality: Left;   SHOULDER ARTHROSCOPY WITH ROTATOR CUFF REPAIR Right 04/12/2020   Procedure: right shoulder arthroscopy, debridement, mini open rotator cuff tear repair;  Surgeon: Addie Cordella Hamilton, MD;  Location: [facility] OR;  Service: Orthopedics;  Laterality: Right;   TONSILLECTOMY     TOTAL HIP ARTHROPLASTY Right 03/20/2015   TOTAL HIP ARTHROPLASTY Right 03/20/2015   Procedure: TOTAL HIP ARTHROPLASTY ANTERIOR APPROACH;  Surgeon: Evalene JONETTA Chancy, MD;  Location: [facility] OR;  Service: Orthopedics;  Laterality: Right;   TOTAL HIP ARTHROPLASTY Left 01/08/2016   Procedure: TOTAL HIP ARTHROPLASTY ANTERIOR APPROACH;   Surgeon: Evalene JONETTA Chancy, MD;  Location: [facility] OR;  Service: Orthopedics;  Laterality: Left;    Allergies: Poison ivy extract [poison ivy extract] and Coreg  [carvedilol ]  Medications: Prior to Admission medications   Medication Sig Start Date End Date Taking? Authorizing Provider  Accu-Chek Softclix Lancets lancets SMARTSIG:Topical 2-3 Times Daily 05/15/23   [provider]  acetaminophen  (TYLENOL ) 325 MG tablet Take 2 tablets (650 mg total) by mouth every 6 (six) hours as needed for mild pain (pain score 1-3) (or Fever >/= 101). 03/03/24   Pearlean Manus, MD  albuterol  (VENTOLIN  HFA) 108 (90 Base) MCG/ACT inhaler Inhale 2 puffs into the lungs every 6 (six) hours as needed for wheezing or shortness of breath (cough). 03/03/24   Pearlean Manus, MD  amLODipine  (NORVASC ) 2.5 MG tablet Take 1 tablet (2.5 mg total) by mouth daily. 03/03/24 06/22/24  Pearlean Manus, MD  aspirin  EC 81 MG tablet Take 1 tablet (81 mg total) by mouth daily with breakfast. Swallow whole. 03/03/24   Pearlean Manus, MD  atorvastatin  (LIPITOR) 40 MG tablet Take 1 tablet (40 mg total) by mouth daily. 03/03/24   Pearlean Manus, MD  Camphor-Menthol -Methyl Sal (  SALONPAS EX) Apply topically. As needed    [provider]  clopidogrel  (PLAVIX ) 75 MG tablet Take 1 tablet (75 mg total) by mouth daily. 06/16/24   Burnette Fallow, MD  cyanocobalamin  (VITAMIN B12) 1000 MCG tablet Take 1,000 mcg by mouth daily. 05/03/24   [provider]  DULoxetine  (CYMBALTA ) 30 MG capsule Take 1 capsule (30 mg total) by mouth 2 (two) times daily. 04/29/24   [name], MD  ferrous sulfate  325 (65 FE) MG EC tablet Take 1 tablet (325 mg total) by mouth every Tuesday, Thursday, Saturday, and Sunday. 03/03/24   Pearlean Manus, MD  gabapentin  (NEURONTIN ) 300 MG capsule Take 1 capsule (300 mg total) by mouth at bedtime. 02/25/24   [name], MD  JARDIANCE  10 MG TABS tablet Take 1 tablet (10 mg total) by mouth daily. 01/01/24    [name], MD  pantoprazole  (PROTONIX ) 40 MG tablet Take 1 tablet (40 mg total) by mouth 2 (two) times daily. 04/19/24 07/18/24  Ahmed, Muhammad F, MD  polyethylene glycol powder (GLYCOLAX /MIRALAX ) 17 GM/SCOOP powder Take 8.5 g by mouth daily. 03/15/24   Ahmed, Deatrice FALCON, MD  rOPINIRole  (REQUIP ) 2 MG tablet Take 1 tablet (2 mg total) by mouth at bedtime. 04/21/24   Hope Almarie ORN, NP  umeclidinium-vilanterol (ANORO ELLIPTA ) 62.5-25 MCG/ACT AEPB Inhale 1 puff into the lungs daily. 03/03/24   Pearlean Manus, MD     Family History  Problem Relation Age of Onset   Heart disease Father    Colon cancer Neg Hx    Colon polyps Neg Hx     Social History   Socioeconomic History   Marital status: Married    Spouse name: [name] C.  Devera   Number of children: Not on file   Years of education: Not on file   Highest education level: 12th grade  Occupational History   Not on file  Tobacco Use   Smoking status: Some Days    Current packs/day: 0.25    Average packs/day: 0.3 packs/day for 47.0 years (11.8 ttl pk-yrs)    Types: Cigarettes    Passive exposure: Current   Smokeless tobacco: Never   Tobacco comments:    04/22/2024 Patient smokes 2 cigarettes daily    Currently smokes about 2 cigarettes per day.  07/14/2023 hfb    1 a day as of 04/29/2024  Vaping Use   Vaping status: Never Used  Substance and Sexual Activity   Alcohol use: Not Currently    Comment: quit 2012   Drug use: Not Currently    Types: Cocaine    Comment: many yrs ago., last time- late 2016   Sexual activity: Yes  Other Topics Concern   Not on file  Social History Narrative   Are you right handed or left handed? Right   Are you currently employed ? disabled   What is your current occupation?   Do you live at home alone? NO   Who lives with you? Mother, Wife   What type of home do you live in: 1 story or 2 story? 1       Social Drivers of Corporate investment banker Strain: Medium Risk (12/28/2023)    Overall Financial Resource Strain (CARDIA)    Difficulty of Paying Living Expenses: Somewhat hard  Food Insecurity: No Food Insecurity (05/02/2024)   Hunger Vital Sign    Worried About Running Out of Food in the Last Year: Never true    Ran Out of Food  in the Last Year: Never true  Transportation Needs: No Transportation Needs (05/02/2024)   PRAPARE - Administrator, Civil Service (Medical): No    Lack of Transportation (Non-Medical): No  Physical Activity: Insufficiently Active (12/28/2023)   Exercise Vital Sign    Days of Exercise per Week: 1 day    Minutes of Exercise per Session: 10 min  Stress: Stress Concern Present (12/28/2023)   [employer] of Occupational Health - Occupational Stress Questionnaire    Feeling of Stress : To some extent  Social Connections: Unknown (03/01/2024)   Social Connection and Isolation Panel    Frequency of Communication with Friends and Family: Patient declined    Frequency of Social Gatherings with Friends and Family: Patient declined    Attends Religious Services: More than 4 times per year    Active Member of [employer] or Organizations: Yes    Attends Banker Meetings: Patient declined    Marital Status: Married    Review of Systems: A 12 point ROS discussed and pertinent positives are indicated in the HPI above.  All other systems are negative.  Review of Systems  Constitutional:  Negative for fatigue.  Neurological:  Positive for dizziness.  All other systems reviewed and are negative.   Vital Signs: BP (!) 134/90   Pulse 74   Temp 97.9 F (36.6 C) (Oral)   Resp 17   Ht 6' 6 (1.981 m)   Wt 235 lb (106.6 kg)   SpO2 96%   BMI 27.16 kg/m   Physical Exam Constitutional:      General: He is not in acute distress.    Appearance: He is not ill-appearing.  HENT:     Mouth/Throat:     Mouth: Mucous membranes are moist.     Pharynx: Oropharynx is clear.  Cardiovascular:     Rate and Rhythm: Normal rate.   Abdominal:     Tenderness: There is no abdominal tenderness.  Skin:    General: Skin is warm and dry.  Neurological:     Mental Status: He is alert and oriented to person, place, and time.     Cranial Nerves: Dysarthria present.     Motor: Weakness present.     Comments: Bilateral arm/hand weakness. Patient unable to write.      Imaging: US  Soft Tissue Head/Neck Result Date: 06/06/2024 CLINICAL DATA:  Left submandibular swelling EXAM: ULTRASOUND OF HEAD/NECK SOFT TISSUES TECHNIQUE: Ultrasound examination of the head and neck soft tissues was performed in the area of clinical concern. COMPARISON:  None Available. FINDINGS: Sonographic interrogation the region of clinical concern in the left submandibular gland demonstrates a small rounded mixed cystic and solid structure with posterior acoustic enhancement measuring 0.8 x 0.7 cm. No evidence of internal vascularity. This appears to represent a small complex cyst. Additional evaluation the left lateral neck identifies a normal sized and normal appearing lymph node measuring only 0.4 cm in short axis. Finally, evaluation of the soft tissues at the base of the neck identifies a oblong fatty mass measuring 4.7 x 2.6 x 4.6 cm. The echogenicity is similar to that of the adjacent adipose tissue. No evidence of internal vascularity. This finding is likely a benign lipoma. IMPRESSION: 1. Small subcentimeter complex cyst within the left submandibular gland. No evidence of internal vascularity. Consider follow-up ultrasound in 3-6 months to assess for stability. 2. 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 3. No suspicious lymphadenopathy. Electronically Signed  By: Wilkie Lent M.D.   On: 06/06/2024 07:28    Labs:  CBC: Recent Labs    03/15/24 1040 04/15/24 1136 05/02/24 1058 05/27/24 1350  WBC 7.0 5.2 10.0 8.9  HGB 8.0* 10.0* 11.0* 12.7*  HCT 30.1* 34.0* 36.2* 40.5  PLT 230 288 250 293    COAGS: No results  for input(s): INR, APTT in the last 8760 hours.  BMP: Recent Labs    03/03/24 0909 04/15/24 1136 05/02/24 1058 05/27/24 1350  NA 136 136 138 137  K 5.1 4.3 4.5 4.6  CL 102 106 108 104  CO2 27 22 22 22   GLUCOSE 138* 111* 152* 120*  BUN 17 23 47* 27*  CALCIUM  11.2* 11.3* 10.7* 11.3*  CREATININE 1.86* 1.66* 2.22* 1.88*  GFRNONAA 40* 45* 32* 39*    LIVER FUNCTION TESTS: Recent Labs    01/27/24 1528 02/29/24 1051 05/02/24 1058 05/27/24 1350  BILITOT 0.4 0.5 0.6 0.9  AST 14* 15 16 20   ALT 12 14 23  32  ALKPHOS 63 79 88 92  PROT 7.3 7.7 7.4 7.8  ALBUMIN  3.7 3.7 3.8 3.9    TUMOR MARKERS: No results for input(s): AFPTM, CEA, CA199, CHROMGRNA in the last 8760 hours.  Assessment and Plan:  Gastric adenocarcinoma; pending chemotherapy: An Schnabel. Raymond Barrera., 63 year old male, presents today to the [facility] Interventional Radiology department for an image-guided port-a-catheter placement.   Risks and benefits of image-guided port-a-catheter placement were discussed with the patient including, but not limited to bleeding, infection, pneumothorax, or fibrin sheath development and need for additional procedures.  All of the patient's questions were answered, patient is agreeable to proceed. He has been NPO. He is a full code.   Consent signed and in chart.  Thank you for this interesting consult.  I greatly enjoyed meeting Raymond Barrera. and look forward to participating in their care.  A copy of this report was sent to the requesting provider on this date.  Electronically Signed: Warren Dais, AGACNP-BC 06/23/2024, 11:11 AM   I spent a total of  30 Minutes   in face to face in clinical consultation, greater than 50% of which was counseling/coordinating care for gastric adenocarcinoma.

## 2024-06-24 DIAGNOSIS — G4733 Obstructive sleep apnea (adult) (pediatric): Secondary | ICD-10-CM | POA: Diagnosis not present

## 2024-06-26 ENCOUNTER — Encounter: Payer: Self-pay | Admitting: Oncology

## 2024-06-26 ENCOUNTER — Encounter: Payer: Self-pay | Admitting: Hematology

## 2024-06-26 DIAGNOSIS — Z720 Tobacco use: Secondary | ICD-10-CM | POA: Insufficient documentation

## 2024-06-26 NOTE — Assessment & Plan Note (Addendum)
 Iron  deficiency anemia likely secondary to bleeding from stomach cancer.  Improved with iron  supplementation. S/p 1 g Monoferric on 05/10/2024 Symptomatically feels significantly better  - Continue taking iron  pills every other day.  Use MiraLAX  for constipation

## 2024-06-26 NOTE — Assessment & Plan Note (Addendum)
 Patient has submandibular swelling on the left side  Ultrasound showed a complex cyst.  -Repeat ultrasound in 3 to 6 months to assess for stability

## 2024-06-26 NOTE — Assessment & Plan Note (Addendum)
 Patient is a chronic smoker and has recently stopped smoking for the past 5 days  - Encouraged his attempts to abstain from smoking

## 2024-06-26 NOTE — Assessment & Plan Note (Signed)
 Newly diagnosed gastric adenocarcinoma via biopsy of ulcer base on enteroscopy.  Oncology history as below Likely stage I-T2N0M0 PET scan with no evidence of metastatic disease   -Discussed with patient that this stage of gastric adenocarcinoma usually requires perioperative chemotherapy with D-FLOT regimen per [facility] trial which showed significant PFS benefit. -Considering patient's peripheral neuropathy, would likely dose reduce oxaliplatin after a short period of trial. - Will refer to surgical oncology for evaluation and if upfront surgery is even an option for the patient.  Will send a referral to [facility]. -Discussed risk versus benefits of port placement and will get it scheduled - Will have a chemo education session scheduled considering patient would require chemotherapy either perioperatively or postsurgically.  Return to clinic after surgical evaluation for discussion of further management.

## 2024-06-26 NOTE — Assessment & Plan Note (Addendum)
 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  - Currently in remission - Unknown if patient follows with urology at this time

## 2024-06-26 NOTE — Assessment & Plan Note (Addendum)
 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 Has some difficulty holding his phone in his right hand  - Will have caution while administering platinum based chemotherapy. - Continue taking gabapentin  and follow-up with primary care

## 2024-06-27 NOTE — Telephone Encounter (Signed)
**Note De-identified  [place] Obfuscation** Please advise 

## 2024-06-29 ENCOUNTER — Other Ambulatory Visit: Payer: Self-pay | Admitting: *Deleted

## 2024-06-29 MED ORDER — HYDROXYZINE HCL 25 MG PO TABS: 25.0000 mg | ORAL_TABLET | Freq: Three times a day (TID) | ORAL | 0 refills | Status: DC | PRN

## 2024-06-30 DIAGNOSIS — C169 Malignant neoplasm of stomach, unspecified: Secondary | ICD-10-CM | POA: Diagnosis not present

## 2024-07-12 DIAGNOSIS — C169 Malignant neoplasm of stomach, unspecified: Secondary | ICD-10-CM | POA: Diagnosis not present

## 2024-07-12 DIAGNOSIS — C162 Malignant neoplasm of body of stomach: Secondary | ICD-10-CM | POA: Diagnosis not present

## 2024-07-15 ENCOUNTER — Other Ambulatory Visit (INDEPENDENT_AMBULATORY_CARE_PROVIDER_SITE_OTHER): Payer: Self-pay | Admitting: Gastroenterology

## 2024-07-15 DIAGNOSIS — K2951 Unspecified chronic gastritis with bleeding: Secondary | ICD-10-CM

## 2024-07-18 ENCOUNTER — Inpatient Hospital Stay: Attending: Oncology | Admitting: Dietician

## 2024-07-18 ENCOUNTER — Telehealth: Payer: Self-pay | Admitting: Dietician

## 2024-07-18 DIAGNOSIS — Z79633 Long term (current) use of mitotic inhibitor: Secondary | ICD-10-CM | POA: Insufficient documentation

## 2024-07-18 DIAGNOSIS — C169 Malignant neoplasm of stomach, unspecified: Secondary | ICD-10-CM | POA: Insufficient documentation

## 2024-07-18 DIAGNOSIS — Z79631 Long term (current) use of antimetabolite agent: Secondary | ICD-10-CM | POA: Insufficient documentation

## 2024-07-18 DIAGNOSIS — R220 Localized swelling, mass and lump, head: Secondary | ICD-10-CM | POA: Insufficient documentation

## 2024-07-18 DIAGNOSIS — F17210 Nicotine dependence, cigarettes, uncomplicated: Secondary | ICD-10-CM | POA: Insufficient documentation

## 2024-07-18 DIAGNOSIS — T451X5A Adverse effect of antineoplastic and immunosuppressive drugs, initial encounter: Secondary | ICD-10-CM | POA: Insufficient documentation

## 2024-07-18 DIAGNOSIS — E1122 Type 2 diabetes mellitus with diabetic chronic kidney disease: Secondary | ICD-10-CM | POA: Insufficient documentation

## 2024-07-18 DIAGNOSIS — D500 Iron deficiency anemia secondary to blood loss (chronic): Secondary | ICD-10-CM | POA: Insufficient documentation

## 2024-07-18 DIAGNOSIS — K5900 Constipation, unspecified: Secondary | ICD-10-CM | POA: Insufficient documentation

## 2024-07-18 DIAGNOSIS — Z8551 Personal history of malignant neoplasm of bladder: Secondary | ICD-10-CM | POA: Insufficient documentation

## 2024-07-18 DIAGNOSIS — E1142 Type 2 diabetes mellitus with diabetic polyneuropathy: Secondary | ICD-10-CM | POA: Insufficient documentation

## 2024-07-18 DIAGNOSIS — N1832 Chronic kidney disease, stage 3b: Secondary | ICD-10-CM | POA: Insufficient documentation

## 2024-07-18 DIAGNOSIS — Z79630 Long term (current) use of alkylating agent: Secondary | ICD-10-CM | POA: Insufficient documentation

## 2024-07-18 DIAGNOSIS — Z79620 Long term (current) use of immunosuppressive biologic: Secondary | ICD-10-CM | POA: Insufficient documentation

## 2024-07-18 DIAGNOSIS — G620 Drug-induced polyneuropathy: Secondary | ICD-10-CM | POA: Insufficient documentation

## 2024-07-18 NOTE — Addendum Note (Signed)
 Encounter addended by: Janice Lynwood BROCKS on: 07/18/2024 1:17 PM  Actions taken: Imaging Exam ended

## 2024-07-18 NOTE — Addendum Note (Signed)
 Encounter addended by: Janice Lynwood BROCKS on: 07/18/2024 1:18 PM  Actions taken: Imaging Exam ended

## 2024-07-18 NOTE — Telephone Encounter (Signed)
 Nutrition Assessment   Reason for Assessment: Referral    ASSESSMENT: 63 year old male with newly diagnosed gastric cancer. He is planning to receive neoadjuvant D-FLOT (under workup). Patient is under the care of Dr. Davonna  Past medical history includes TIA, HTN, sCHF, COPD, chronic gastritis, DM2, peripheral neuropathy, lumbar spondylosis, DJD, CKD, RCC, HLD, aphasia s/p CVA, IDA, tobacco use  Spoke with patient and wife via telephone. Patient reports appetite is good. He eats 3-5 times daily. Patient says it is hard to chew as he no longer has teeth. Patient likes a variety of foods including (cereal, oatmeal, sandwiches, jello, fruit, fried chicken, roast beef, cabbage, pintos, spaghetti, hamburgers). Patient is complaining of intermittent stomach pain which is new in the last few days. Patient unsure if this occurs before/after meals. Denies nausea, vomiting. He will discuss with provider tomorrow.   Nutrition Focused Physical Exam: unable to complete (telephone visit)   Medications: amlodipine , lipitor, plavix , B12, cymbalta , ferrous sulfate , gabapentin , atarax , jardiance , protonix , miralax , requip    Labs: 6/27 labs reviewed    Anthropometrics:   Height: 6'6 Weight: 236 lb 8 oz  UBW: 240-245 lb  BMI: 27.33   NUTRITION DIAGNOSIS: Food and nutrition related knowledge deficit related to cancer as evidenced by no prior need for associated nutrition information   INTERVENTION:  Discussed importance of adequate calorie/protein energy intake to preserve LBM during treatment  Educated on small frequent meals/snacks that are high in calories and protein Discussed GI soft diet for ease of digestion - avoiding raw fruits/vegetables, fried foods Discussed importance of hydration - recommend 3L/day Suggested daily ONS for added calories and protein - samples of Ensure Complete + coupons provided  Contact information, handouts, samples left for pt to pick up at 8/19 [facility] appt     MONITORING, EVALUATION, GOAL: Patient will tolerate increased calories and protein to minimize further wt loss    Next Visit: To be determined

## 2024-07-19 ENCOUNTER — Encounter: Payer: Self-pay | Admitting: Oncology

## 2024-07-19 ENCOUNTER — Inpatient Hospital Stay (HOSPITAL_BASED_OUTPATIENT_CLINIC_OR_DEPARTMENT_OTHER): Admitting: Oncology

## 2024-07-19 VITALS — BP 143/92 | HR 70 | Temp 97.7°F | Resp 18 | Wt 239.2 lb

## 2024-07-19 DIAGNOSIS — E1122 Type 2 diabetes mellitus with diabetic chronic kidney disease: Secondary | ICD-10-CM | POA: Diagnosis not present

## 2024-07-19 DIAGNOSIS — T451X5A Adverse effect of antineoplastic and immunosuppressive drugs, initial encounter: Secondary | ICD-10-CM | POA: Diagnosis not present

## 2024-07-19 DIAGNOSIS — R221 Localized swelling, mass and lump, neck: Secondary | ICD-10-CM

## 2024-07-19 DIAGNOSIS — N1832 Chronic kidney disease, stage 3b: Secondary | ICD-10-CM

## 2024-07-19 DIAGNOSIS — G6289 Other specified polyneuropathies: Secondary | ICD-10-CM

## 2024-07-19 DIAGNOSIS — Z79620 Long term (current) use of immunosuppressive biologic: Secondary | ICD-10-CM | POA: Diagnosis not present

## 2024-07-19 DIAGNOSIS — Z79631 Long term (current) use of antimetabolite agent: Secondary | ICD-10-CM | POA: Diagnosis not present

## 2024-07-19 DIAGNOSIS — Z720 Tobacco use: Secondary | ICD-10-CM

## 2024-07-19 DIAGNOSIS — C169 Malignant neoplasm of stomach, unspecified: Secondary | ICD-10-CM

## 2024-07-19 DIAGNOSIS — C641 Malignant neoplasm of right kidney, except renal pelvis: Secondary | ICD-10-CM

## 2024-07-19 DIAGNOSIS — G620 Drug-induced polyneuropathy: Secondary | ICD-10-CM | POA: Diagnosis not present

## 2024-07-19 DIAGNOSIS — R220 Localized swelling, mass and lump, head: Secondary | ICD-10-CM

## 2024-07-19 DIAGNOSIS — E1142 Type 2 diabetes mellitus with diabetic polyneuropathy: Secondary | ICD-10-CM | POA: Diagnosis not present

## 2024-07-19 DIAGNOSIS — D500 Iron deficiency anemia secondary to blood loss (chronic): Secondary | ICD-10-CM

## 2024-07-19 DIAGNOSIS — K5900 Constipation, unspecified: Secondary | ICD-10-CM | POA: Diagnosis not present

## 2024-07-19 DIAGNOSIS — F17210 Nicotine dependence, cigarettes, uncomplicated: Secondary | ICD-10-CM | POA: Diagnosis not present

## 2024-07-19 DIAGNOSIS — Z8551 Personal history of malignant neoplasm of bladder: Secondary | ICD-10-CM | POA: Diagnosis not present

## 2024-07-19 DIAGNOSIS — Z79630 Long term (current) use of alkylating agent: Secondary | ICD-10-CM | POA: Diagnosis not present

## 2024-07-19 DIAGNOSIS — Z79633 Long term (current) use of mitotic inhibitor: Secondary | ICD-10-CM | POA: Diagnosis not present

## 2024-07-19 NOTE — Assessment & Plan Note (Addendum)
 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  - Currently in remission - Unknown if patient follows with urology at this time

## 2024-07-19 NOTE — Assessment & Plan Note (Addendum)
 Patient has submandibular swelling on the left side  Ultrasound showed a complex cyst. Resolved at this time

## 2024-07-19 NOTE — Assessment & Plan Note (Addendum)
 Patient is a chronic smoker and is currently smoking 1 to 2 cigarettes/day  - Encouraged his attempts to abstain from smoking

## 2024-07-19 NOTE — Assessment & Plan Note (Addendum)
 Iron  deficiency anemia likely secondary to bleeding from stomach cancer.  Improved with iron  supplementation. S/p 1 g Monoferric on 05/10/2024 Symptomatically feels significantly better  - Continue taking iron  pills every other day.  Use MiraLAX  for constipation

## 2024-07-19 NOTE — Assessment & Plan Note (Addendum)
 Chronic kidney disease likely secondary to only 1 functioning kidney Follows with Dr. Rachele

## 2024-07-19 NOTE — Progress Notes (Signed)
 Pharmacist Chemotherapy Monitoring - Initial Assessment    Anticipated start date: 07/27/24   The following has been reviewed per standard work regarding the patient's treatment regimen: The patient's diagnosis, treatment plan and drug doses, and organ/hematologic function Lab orders and baseline tests specific to treatment regimen  The treatment plan start date, drug sequencing, and pre-medications Prior authorization status  Patient's documented medication list, including drug-drug interaction screen and prescriptions for anti-emetics and supportive care specific to the treatment regimen The drug concentrations, fluid compatibility, administration routes, and timing of the medications to be used The patient's access for treatment and lifetime cumulative dose history, if applicable  The patient's medication allergies and previous infusion related reactions, if applicable   Changes made to treatment plan:  treatment plan date Drug addition Adjust GCSF day  Orders received to add Imfinzi 1500 mg q 28 days to plan.  Will be every other FLOT. Orders received to move Udenyca 6 subcutaneous to pump DC day (day2)  Follow up needed:  Pending authorization for treatment    Niels FORBES Molt, [facility], 07/19/2024  12:17 PM

## 2024-07-19 NOTE — Progress Notes (Signed)
 Gastroesophageal - No Medical Intervention - Off Treatment.  Patient Characteristics: Gastric, Adenocarcinoma, Preoperative or Nonsurgical Candidate, M0 (Clinical Staging), Stage I-III (cT1-T4a, any N), Surgical Candidate, cT1-cT2, cN0 Therapeutic Status: Preoperative or Nonsurgical Candidate, M0 (Clinical Staging) Histology: Adenocarcinoma Disease Classification: Gastric AJCC N Category: cN0 AJCC M Category: cM0 AJCC 8 Stage Grouping: I AJCC T Category: cT2

## 2024-07-19 NOTE — Patient Instructions (Signed)
 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 You will be treated here in the Cancer Center every 2 weeks with a combination of chemotherapy and immunotherapy. The chemotherapy medications you will receive are called Docetaxel (Taxotere), Oxaliplatin, and Fluorouracil. The immunotherapy medication you will receive is called Durvalumab (Imfinzi) which will be given every 4 weeks. The intent of treatment is cure.   You will see the doctor regularly throughout treatment.  We will obtain blood work from you prior to every treatment and monitor your results to make sure it is safe to give your treatment. The doctor monitors your response to treatment by the way you are feeling, your blood work, and by obtaining scans periodically.  There will be wait times while you are here for treatment.  It will take about 30 minutes to 1 hour for your lab work to result.  Then there will be wait times while pharmacy mixes your medications.     Medications you will receive in the clinic prior to your chemotherapy medications:  Aloxi:  ALOXI is used in adults to help prevent nausea and vomiting that happens with certain chemotherapy drugs.  Aloxi is a long acting medication, and will remain in your system for about two days.   Dexamethasone :  This is a steroid given prior to chemotherapy to help prevent allergic reactions; it may also help prevent and control nausea and diarrhea.     Docetaxel (Taxotere)   About This Drug   Docetaxel is used to treat cancer. It is given in the vein (IV). It will take 1 hour to infuse.  The first infusion will take longer (about 1.5 hours) due to the fact that we start this drug at a very slow rate and gradually increase the rate of infusion until the maximum rate is reached.  This is done to monitor for infusion reactions, and to make sure you will tolerated this drug without adverse effects.  If you tolerate the first  infusion, all subsequent infusions will be started at the normal rate and given over 1 hour.  Possible Side Effects    Bone marrow suppression. This is a decrease in the number of white blood cells, red blood cells, and platelets. This may raise your risk of infection, make you tired and weak, and raise your risk of bleeding.    Neutropenic fever. A type of fever that can develop when you have a very low number of white blood cells which can be life-threatening.    Soreness of the mouth and throat. You may have red areas, white patches, or sores that hurt.    Nausea and vomiting (throwing up)    Constipation (not able to move bowels)    Diarrhea (loose bowel movements)    Infections    Fluid retention - swelling of the hands, feet, or any other part of the body. Fluid may build-up around your lungs and/or heart.    Changes in the way food and drinks taste    Effects on the nerves are called peripheral neuropathy. You may feel numbness, tingling, or pain in your hands and feet. It may be hard for you to button your clothes, open jars, or walk as usual. The effect on the nerves may get worse with more doses of the drug. These effects get better in some people after the drug is stopped but it does not get better in all people.    Decreased appetite (decreased hunger)  Weakness    Pain    Muscle pain/aching    Trouble breathing    Changes in your nail color, you may have nail loss and/or brittle nail    Hair loss. Hair loss is often temporary, although there have been cases of permanent hair loss reported. Hair loss may happen suddenly or gradually. If you lose hair, you may lose it from your head, face, armpits, pubic area, chest, and/or legs. You may also notice your hair getting thin.    Allergic skin reaction. You may develop blisters on your skin that are filled with fluid or a severe red rash all over your body that may be painful.    Allergic reactions, including  anaphylaxis are rare but may happen in some patients. Signs of allergic reaction to this drug may be swelling of the face, feeling like your tongue or throat are swelling, trouble breathing, rash, itching, fever, chills, feeling dizzy, and/or feeling that your heart is beating in a fast or not normal way. If this happens, do not take another dose of this drug. You should get urgent medical treatment.   Note: Not all possible side effects are included above.   Warnings and Precautions    Severe bone marrow suppression, including febrile neutropenia which can be life-threatening    Severe allergic reactions, including anaphylaxis which can be life-threatening  Colitis, which is swelling (inflammation) in the colon - symptoms are diarrhea (loose bowel movements), stomach cramping, and sometimes blood in the bowel movements  Severe skin reactions, including redness, swelling, or peeling of skin  Severe swelling in the eye or other changes in eyesight    Severe fluid retention    If you have a history of abnormal liver function, receive high doses of docetaxel, or have a history of lung cancer and have received treatment with a platinum (type of chemotherapy medication), you have an increased risk of death.    Severe weakness    This drug may raise your risk of getting a second cancer such as leukemia, lymphoma, myelodysplastic syndrome, and kidney cancer.    Severe peripheral neuropathy    This drug contains alcohol and may affect your central nervous system. The central nervous system is made up of your brain and spinal cord. You may feel dizzy and very sleepy.    Tumor lysis syndrome: This drug may act on the cancer cells very quickly. This may affect how your kidneys work. Note: Some of the side effects above are very rare. If you have concerns and/or questions, please discuss them with your medical team. Important Information    This drug may be present in the saliva, tears, sweat, urine,  stool, vomit, semen, and vaginal secretions. Talk to your doctor and/or your nurse about the necessary precautions to take during this time.    This drug may impair your ability to drive or use machinery. Use caution and tell your nurse or doctor if you feel dizzy, very sleepy, and/or experience low blood pressure. Treating Side Effects    Manage tiredness by pacing your activities for the day.    Be sure to include periods of rest between energy-draining activities.    Get regular exercise. If you feel too tired to exercise vigorously, try taking a short walk.     To decrease the risk of infection, wash your hands regularly.    Avoid close contact with people who have a cold, the flu, or other infections.    Take your temperature as your  doctor or nurse tells you, and whenever you feel like you may have a fever.    To help decrease the risk of bleeding, use a soft toothbrush. Check with your nurse before using dental floss.    Be very careful when using knives or tools.    Use an electric shaver instead of a razor.    Mouth care is very important and will help food taste better and improve your appetite. Your mouth care should consist of routine, gentle cleaning of your teeth or dentures and rinsing your mouth with a mixture of 1/2 teaspoon of salt in 8 ounces of water  or 1/2 teaspoon of baking soda in 8 ounces of water . This should be done at least after each meal and at bedtime.    If you have mouth sores, avoid mouthwash that has alcohol. Also avoid alcohol and smoking because they can bother your mouth and throat.    Taking good care of your mouth may help food taste better and improve your appetite    Drink plenty of fluids (a minimum of eight glasses per day is recommended).    If you throw up or have diarrhea, you should drink more fluids so that you do not become dehydrated (lack of water  in the body from losing too much fluid).    If you have diarrhea, eat low-fiber foods  that are high in protein and calories and avoid foods that can irritate your digestive tracts or lead to cramping.    If you are not able to move your bowels, check with your doctor or nurse before you use enemas, laxatives, or suppositories.    Ask your doctor or nurse about medicines that are available to help stop or lessen constipation and/ or diarrhea.    To help with nausea and vomiting, eat small, frequent meals instead of three large meals a day. Choose foods and drinks that are at room temperature. Ask your nurse or doctor about other helpful tips and medicine that is available to help stop or lessen these symptoms.    To help with decreased appetite, eat foods high in calories and protein, such as meat, poultry, fish, dry beans, tofu, eggs, nuts, milk, yogurt, cheese, ice cream, pudding, and nutritional supplements.    Consider using sauces and spices to increase taste. Daily exercise, with your doctor's approval, may increase your appetite.    Keeping your pain under control is important to your well-being. Please tell your doctor or nurse if you are experiencing pain.    If you get a rash do not put anything on it unless your doctor or nurse says you may. Keep the area around the rash clean and dry. Ask your doctor for medicine if your rash bothers you.    Keeping your nails moisturized may help with brittleness.    To help with hair loss, wash with a mild shampoo and avoid washing your hair every day. Avoid coloring your hair.    Avoid rubbing your scalp, pat your hair or scalp dry.    Limit your use of hair spray, electric curlers, blow dryers, and curling irons.    If you are interested in getting a wig, talk to your nurse and they can help you get in touch with programs in your local area.    If you have numbness and tingling in your hands and feet, be careful when cooking, walking, and handling sharp objects and hot liquids.   Food and Drug Interactions  This drug may  interact with grapefruit and grapefruit juice. Talk to your doctor as this could make side effects worse.    This drug may interact with other medicines. Tell your doctor and pharmacist about all the prescription and over-the-counter medicines and dietary supplements (vitamins, minerals, herbs, and others) that you are taking at this time. Also, check with your doctor or pharmacist before starting any new prescription or over-the-counter medicines, or dietary supplements to make sure that there are no interactions.    This drug may interact with St. John's Wort and may lower the levels of the drug in your body, which can make it less effective. When to Call the Doctor Call your doctor or nurse if you have any of these symptoms and/or any new or unusual symptoms:    Fever of 100.4 F (38 C) or higher    Chills    Blurred vision or other changes in eyesight, excessive tearing    Symptoms of being drunk, confusion, or being very sleepy    Feeling dizzy or lightheaded  Tiredness and/or weakness that interferes with your daily activities  Easy bruising or bleeding    Wheezing and/or trouble breathing    Chest pain    Pain in your mouth or throat that makes it hard to eat or drink    Nausea that stops you from eating or drinking and/or is not relieved by prescribed medicines    Throwing up more than 3 times a day    Lasting loss of appetite or rapid weight loss of five pounds in a week    Diarrhea, 4 times in one day or diarrhea with lack of strength or a feeling of being dizzy    No bowel movement in 3 days or when you feel uncomfortable    Pain in your abdomen that does not go away    Blood in your stool    Swelling of the hands, feet, or any other part of the body    Weight gain of 5 pounds in one week (fluid retention)    New rash and/or itching  Rash that is not relieved by prescribed medicines    Signs of inflammation/infection (redness, swelling, pain) of the tissue  around your nails.    Signs of allergic reaction: swelling of the face, feeling like your tongue or throat are swelling, trouble breathing, rash, itching, fever, chills, feeling dizzy, and/or feeling that your heart is beating in a fast or not normal way. If this happens, call 911 for emergency care.    Flu-like symptoms: fever, headache, muscle and joint aches, and fatigue (low energy, feeling weak)    Signs of possible liver problems: dark urine, pale bowel movements, pain in your abdomen, feeling very tired and weak, unusual itching, or yellowing of the eyes or skin    Numbness, tingling, or pain in your hands and feet    Signs of tumor lysis: confusion or agitation, decreased urine, nausea/vomiting, diarrhea, muscle cramping, numbness and/or tingling, seizures.    General pain that does not go away or is not relieved by prescribed medicine    If you think you may be pregnant or have impregnated your partner   Reproduction Warnings    Pregnancy warning: This drug can have harmful effects on the unborn baby. Women of childbearing potential should use effective methods of birth control during your cancer treatment and for 6 months after stopping treatment. Men with male partners of childbearing potential should use effective methods of birth  control during your cancer treatment and for 3 months after stopping treatment. Let your doctor know right away if you think you may be pregnant or may have impregnated your partner.    Breastfeeding warning: Women should not breastfeed during treatment and for 1 week after stopping treatment because this drug could enter the breast milk and cause harm to a breastfeeding baby.    Fertility warning: In men, this drug may affect your ability to have children in the future. Talk with your doctor or nurse if you plan to have children. Ask for information on sperm banking.    Oxaliplatin (Eloxatin)  About This Drug  Oxaliplatin is used to treat  cancer. It is given in the vein (IV).  It takes two hours to infuse.  Possible Side Effects   Bone marrow suppression. This is a decrease in the number of white blood cells, red blood cells, and platelets. This may raise your risk of infection, make you tired and weak (fatigue), and raise your risk of bleeding.   Tiredness   Soreness of the mouth and throat. You may have red areas, white patches, or sores that hurt.   Nausea and vomiting (throwing up)   Diarrhea (loose bowel movements)   Changes in your liver function   Effects on the nerves called peripheral neuropathy. You may feel numbness, tingling, or pain in your hands and feet, and may be worse in cold temperatures. It may be hard for you to button your clothes, open jars, or walk as usual. The effect on the nerves may get worse with more doses of the drug. These effects get better in some people after the drug is stopped but it does not get better in all people  Note: Each of the side effects above was reported in 40% or greater of patients treated with oxaliplatin. Not all possible side effects are included above.   Warnings and Precautions   Allergic reactions, including anaphylaxis, which may be life-threatening are rare but may happen in some patients. Signs of allergic reaction to this drug may be swelling of the face, feeling like your tongue or throat are swelling, trouble breathing, rash, itching, fever, chills, feeling dizzy, and/or feeling that your heart is beating in a fast or not normal way. If this happens, do not take another dose of this drug. You should get urgent medical treatment.   Inflammation (swelling) of the lungs, which may be life-threatening. You may have a dry cough or trouble breathing.   Effects on the nerves (neuropathy) may resolve within 14 days, or it may persist beyond 14 days.   Severe decrease in white blood cells when combined with the chemotherapy agents 5-fluorouracil and leucovorin. This  may be life-threatening.   Severe changes in your liver function   Abnormal heart beat and/or EKG, which can be life-threatening   Rhabdomyolysis- damage to your muscles which may release proteins in your blood and affect how your kidneys work, which can be life-threatening. You may have severe muscle weakness and/or pain, or dark urine.  Important Information   This drug may impair your ability to drive or use machinery. Talk to your doctor and/or nurse about precautions you may need to take.   This drug may be present in the saliva, tears, sweat, urine, stool, vomit, semen, and vaginal secretions. Talk to your doctor and/or your nurse about the necessary precautions to take during this time.  * The effects on the nerves can be aggravated by exposure to cold.  Avoid cold beverages, use of ice and make sure you cover your skin and dress warmly prior to being exposed to cold temperatures while you are receiving treatment with oxaliplatin*   Treating Side Effects   Manage tiredness by pacing your activities for the day.   Be sure to include periods of rest between energy-draining activities.   To decrease the risk of infection, wash your hands regularly.   Avoid close contact with people who have a cold, the flu, or other infections.  Take your temperature as your doctor or nurse tells you, and whenever you feel like you may have a fever.   To help decrease the risk of bleeding, use a soft toothbrush. Check with your nurse before using dental floss.   Be very careful when using knives or tools.   Use an electric shaver instead of a razor.   Drink plenty of fluids (a minimum of eight glasses per day is recommended).   Mouth care is very important. Your mouth care should consist of routine, gentle cleaning of your teeth or dentures and rinsing your mouth with a mixture of 1/2 teaspoon of salt in 8 ounces of water  or 1/2 teaspoon of baking soda in 8 ounces of water . This should be done  at least after each meal and at bedtime.   If you have mouth sores, avoid mouthwash that has alcohol. Also avoid alcohol and smoking because they can bother your mouth and throat.   To help with nausea and vomiting, eat small, frequent meals instead of three large meals a day. Choose foods and drinks that are at room temperature. Ask your nurse or doctor about other helpful tips and medicine that is available to help stop or lessen these symptoms.   If you throw up or have loose bowel movements, you should drink more fluids so that you do not become dehydrated (lack of water  in the body from losing too much fluid).   If you have diarrhea, eat low-fiber foods that are high in protein and calories and avoid foods that can irritate your digestive tracts or lead to cramping.   Ask your nurse or doctor about medicine that can lessen or stop your diarrhea.   If you have numbness and tingling in your hands and feet, be careful when cooking, walking, and handling sharp objects and hot liquids.   Do not drink cold drinks or use ice in beverages. Drink fluids at room temperature or warmer, and drink through a straw.   Wear gloves to touch cold objects, and wear warm clothing and cover you skin during cold weather.   Food and Drug Interactions   There are no known interactions of oxaliplatin with food and other medications.   This drug may interact with other medicines. Tell your doctor and pharmacist about all the prescription and over-the-counter medicines and dietary supplements (vitamins, minerals, herbs and others) that you are taking at this time. Also, check with your doctor or pharmacist before starting any new prescription or over-the-counter medicines, or dietary supplements to make sure that there are no interactions   When to Call the Doctor  Call your doctor or nurse if you have any of these symptoms and/or any new or unusual symptoms:   Fever of 100.4 F (38 C) or higher    Chills   Tiredness that interferes with your daily activities   Feeling dizzy or lightheaded   Easy bleeding or bruising   Feeling that your heart is beating in a fast  or not normal way (palpitations)   Pain in your chest   Dry cough   Trouble breathing   Pain in your mouth or throat that makes it hard to eat or drink   Nausea that stops you from eating or drinking and/or is not relieved by prescribed medicines   Throwing up   Diarrhea, 4 times in one day or diarrhea with lack of strength or a feeling of being dizzy   Numbness, tingling, or pain in your hands and feet   Signs of possible liver problems: dark urine, pale bowel movements, bad stomach pain, feeling very tired and weak, unusual itching, or yellowing of the eyes or skin   Signs of rhabdomyolysis: decreased urine, very dark urine, muscle pain in the shoulders, thighs, or lower back; muscle weakness or trouble moving arms and legs   Signs of allergic reaction: swelling of the face, feeling like your tongue or throat are swelling, trouble breathing, rash, itching, fever, chills, feeling dizzy, and/or feeling that your heart is beating in a fast or not normal way. If this happens, call 911 for emergency care.   If you think you may be pregnant  Reproduction Warnings   Pregnancy warning: This drug may have harmful effects on the unborn baby. Women of childbearing potential should use effective methods of birth control during your cancer treatment. Let your doctor know right away if you think you may be pregnant or may have impregnated your partner.   Breastfeeding warning: It is not known if this drug passes into breast milk. For this reason, women should talk to their doctor about the risks and benefits of breastfeeding during treatment with this drug because this drug may enter the breast milk and cause harm to a breastfeeding baby.   Fertility warning: Human fertility studies have not been done with this drug. Talk  with your doctor or nurse if you plan to have children. Ask for information on sperm or egg banking.   Leucovorin Calcium   About This Drug  Leucovorin is a vitamin. It is used in combination with other cancer fighting drugs such as 5-fluorouracil and methotrexate. Leucovorin is given in the vein (IV).  This drug runs at the same time as the oxaliplatin and takes 2 hours to infuse.   Possible Side Effects  Rash and itching  Note: Leucovorin by itself has very few side effects. Other side effects you may have can be caused by the other drugs you are taking, such as 5-fluorouracil.   Warnings and Precautions   Allergic reactions, including anaphylaxis are rare but may happen in some patients. Signs of allergic reaction to this drug may be swelling of the face, feeling like your tongue or throat are swelling, trouble breathing, rash, itching, fever, chills, feeling dizzy, and/or feeling that your heart is beating in a fast or not normal way. If this happens, do not take another dose of this drug. You should get urgent medical treatment.  Food and Drug Interactions   There are no known interactions of leucovorin with food.   This drug may interact with other medicines. Tell your doctor and pharmacist about all the prescription and over-the-counter medicines and dietary supplements (vitamins, minerals, herbs and others) that you are taking at this time.   Also, check with your doctor or pharmacist before starting any new prescription or over-the-counter medicines, or dietary supplements to make sure that there are no interactions.   When to Call the Doctor  Call your doctor or nurse if  you have any of these symptoms and/or any new or unusual symptoms:   A new rash or a rash that is not relieved by prescribed medicines   Signs of allergic reaction: swelling of the face, feeling like your tongue or throat are swelling, trouble breathing, rash, itching, fever, chills, feeling dizzy, and/or  feeling that your heart is beating in a fast or not normal way. If this happens, call 911 for emergency care.   If you think you may be pregnant   Reproduction Warnings   Pregnancy warning: It is not known if this drug may harm an unborn child. For this reason, be sure to talk with your doctor if you are pregnant or planning to become pregnant while receiving this drug. Let your doctor know right away if you think you may be pregnant   Breastfeeding warning: It is not known if this drug passes into breast milk. For this reason, women should talk to their doctor about the risks and benefits of breastfeeding during treatment with this drug because this drug may enter the breast milk and cause harm to a breastfeeding baby.   Fertility warning: Human fertility studies have not been done with this drug. Talk with your doctor or nurse if you plan to have children. Ask for information on sperm or egg banking.   5-Fluorouracil (Adrucil; 5FU)  About This Drug  Fluorouracil is used to treat cancer. It is given in the vein (IV). It is given as an IV push from a syringe and also as a continuous infusion given via an ambulatory pump (a pump you take home and wear for a specified amount of time).  Possible Side Effects   Bone marrow suppression. This is a decrease in the number of white blood cells, red blood cells, and platelets. This may raise your risk of infection, make you tired and weak (fatigue), and raise your risk of bleeding   Changes in the tissue of the heart and/or heart attack. Some changes may happen that can cause your heart to have less ability to pump blood.   Blurred vision or other changes in eyesight   Nausea and throwing up (vomiting)   Diarrhea (loose bowel movements)   Ulcers - sores that may cause pain or bleeding in your digestive tract, which includes your mouth, esophagus, stomach, small/large intestines and rectum   Soreness of the mouth and throat. You may have red  areas, white patches, or sores that hurt.   Allergic reactions, including anaphylaxis are rare but may happen in some patients. Signs of allergic reaction to this drug may be swelling of the face, feeling like your tongue or throat are swelling, trouble breathing, rash, itching, fever, chills, feeling dizzy, and/or feeling that your heart is beating in a fast or not normal way. If this happens, do not take another dose of this drug. You should get urgent medical treatment.   Sensitivity to light (photosensitivity). Photosensitivity means that you may become more sensitive to the sun and/or light. You may get a skin rash/reaction if you are in the sun or are exposed to sun lamps and tanning beds. Your eyes may water  more, mostly in bright light.   Changes in your nail color, nail loss and/or brittle nail   Darkening of the skin, or changes to the color of your skin and/or veins used for infusion   Rash, dry skin, or itching  Note: Not all possible side effects are included above.  Warnings and Precautions   Hand-and-foot  syndrome. The palms of your hands or soles of your feet may tingle, become numb, painful, swollen, or red.   Changes in your central nervous system can happen. The central nervous system is made up of your brain and spinal cord. You could feel extreme tiredness, agitation, confusion, hallucinations (see or hear things that are not there), trouble understanding or speaking, loss of control of your bowels or bladder, eyesight changes, numbness or lack of strength to your arms, legs, face, or body, or coma. If you start to have any of these symptoms let your doctor know right away.   Side effects of this drug may be unexpectedly severe in some patients  Note: Some of the side effects above are very rare. If you have concerns and/or questions, please discuss them with your medical team.   Important Information   This drug may be present in the saliva, tears, sweat, urine,  stool, vomit, semen, and vaginal secretions. Talk to your doctor and/or your nurse about the necessary precautions to take during this time.   Treating Side Effects   Manage tiredness by pacing your activities for the day.   Be sure to include periods of rest between energy-draining activities.   To help decrease the risk of infections, wash your hands regularly.   Avoid close contact with people who have a cold, the flu, or other infections.   Take your temperature as your doctor or nurse tells you, and whenever you feel like you may have a fever.   Use a soft toothbrush. Check with your nurse before using dental floss.   Be very careful when using knives or tools.   Use an electric shaver instead of a razor.   If you have a nose bleed, sit with your head tipped slightly forward. Apply pressure by lightly pinching the bridge of your nose between your thumb and forefinger. Call your doctor if you feel dizzy or faint or if the bleeding doesn't stop after 10 to 15 minutes.   Drink plenty of fluids (a minimum of eight glasses per day is recommended).   If you throw up or have loose bowel movements, you should drink more fluids so that you do not  become dehydrated (lack of water  in the body from losing too much fluid).   To help with nausea and vomiting, eat small, frequent meals instead of three large meals a day. Choose foods and drinks that are at room temperature. Ask your nurse or doctor about other helpful tips and medicine that is available to help, stop, or lessen these symptoms.   If you have diarrhea, eat low-fiber foods that are high in protein and calories and avoid foods that can irritate your digestive tracts or lead to cramping.   Ask your nurse or doctor about medicine that can lessen or stop your diarrhea.   Mouth care is very important. Your mouth care should consist of routine, gentle cleaning of your teeth or dentures and rinsing your mouth with a mixture of 1/2  teaspoon of salt in 8 ounces of water  or 1/2 teaspoon of baking soda in 8 ounces of water . This should be done at least after each meal and at bedtime.   If you have mouth sores, avoid mouthwash that has alcohol. Also avoid alcohol and smoking because they can bother your mouth and throat.   Keeping your nails moisturized may help with brittleness.   To help with itching, moisturize your skin several times day.   Use sunscreen with  SPF 30 or higher when you are outdoors even for a short time. Cover up when you are out in the sun. Wear wide-brimmed hats, long-sleeved shirts, and pants. Keep your neck, chest, and back covered. Wear dark sun glasses when in the sun or bright lights.   If you get a rash do not put anything on it unless your doctor or nurse says you may. Keep the area around the rash clean and dry. Ask your doctor for medicine if your rash bothers you.   Keeping your pain under control is important to your well-being. Please tell your doctor or nurse if you are experiencing pain.   Food and Drug Interactions   There are no known interactions of fluorouracil with food.   Check with your doctor or pharmacist about all other prescription medicines and over-the-counter medicines and dietary supplements (vitamins, minerals, herbs and others) you are taking before starting this medicine as there are known drug interactions with 5-fluoroucacil. Also, check with your doctor or pharmacist before starting any new prescription or over-the-counter medicines, or dietary supplements to make sure that there are no interactions.  When to Call the Doctor  Call your doctor or nurse if you have any of these symptoms and/or any new or unusual symptoms:   Fever of 100.4 F (38 C) or higher   Chills   Easy bleeding or bruising   Nose bleed that doesn't stop bleeding after 10-15 minutes   Trouble breathing   Feeling dizzy or lightheaded   Feeling that your heart is beating in a fast or not  normal way (palpitations)   Chest pain or symptoms of a heart attack. Most heart attacks involve pain in the center of the chest that lasts more than a few minutes. The pain may go away and come back or it can be constant. It can feel like pressure, squeezing, fullness, or pain. Sometimes pain is felt in one or both arms, the back, neck, jaw, or stomach. If any of these symptoms last 2 minutes, call 911.   Confusion and/or agitation   Hallucinations   Trouble understanding or speaking   Loss of control of bowels or bladder   Blurry vision or changes in your eyesight   Headache that does not go away   Numbness or lack of strength to your arms, legs, face, or body   Nausea that stops you from eating or drinking and/or is not relieved by prescribed medicines   Throwing up more than 3 times a day   Diarrhea, 4 times in one day or diarrhea with lack of strength or a feeling of being dizzy   Pain in your mouth or throat that makes it hard to eat or drink   Pain along the digestive tract - especially if worse after eating   Blood in your vomit (bright red or coffee-ground) and/or stools (bright red, or black/tarry)   Coughing up blood   Tiredness that interferes with your daily activities   Painful, red, or swollen areas on your hands or feet or around your nails   A new rash or a rash that is not relieved by prescribed medicines   Develop sensitivity to sunlight/light   Numbness and/or tingling of your hands and/or feet   Signs of allergic reaction: swelling of the face, feeling like your tongue or throat are swelling, trouble breathing, rash, itching, fever, chills, feeling dizzy, and/or feeling that your heart is beating in a fast or not normal way. If this happens, call  911 for emergency care.   If you think you are pregnant or may have impregnated your partner  Reproduction Warnings   Pregnancy warning: This drug may have harmful effects on the unborn baby. Women of  child bearing potential should use effective methods of birth control during your cancer treatment and 3 months after treatment. Men with male partners of childbearing potential should use effective methods of birth control during your cancer treatment and for 3 months after your cancer treatment. Let your doctor know right away if you think you may be pregnant or may have impregnated your partner.   Breastfeeding warning: It is not known if this drug passes into breast milk. For this reason, Women should not breastfeed during treatment because this drug could enter the breast milk and cause harm to a breastfeeding baby.   Fertility warning: In men and women both, this drug may affect your ability to have children in the future. Talk with your doctor or nurse if you plan to have children. Ask for information on sperm or egg banking.   Durvalumab (Imfinzi)    About This Drug  Durvalumab is used to treat cancer. This drug is given in the vein (IV). It will take 1 hour to infuse.    Possible Side Effects   Nausea    Tiredness    Weakness    Inflammation (swelling) of the lungs. You may have a dry cough or trouble breathing.    Cough    Trouble breathing    Upper respiratory tract infection    Rash    Hair loss. Hair loss is often temporary, although with certain medicine, hair loss can sometimes be permanent. Hair loss may happen suddenly or gradually. If you lose hair, you may lose it from your head, face, armpits, pubic area, chest, and/or legs. You may also notice your hair getting thin.   Note: Each of the side effects above was reported in 20% or greater of patients treated with durvalumab. Your side effects may be different depending on your specific condition. Not all possible side effects are included above.   Warnings and Precautions   This drug works with your immune system and can cause inflammation (swelling) in any of your organs and tissues and can change how they work.  This may put you at risk for developing serious medical problems, which can be life-threatening. These side effects may require treatment with steroids at the discretion of your doctor.    Severe inflammation of the lungs, which can be life-threatening. You may have a dry cough or trouble breathing.    Colitis, which is swelling (inflammation) in the colon. The symptoms are diarrhea (loose bowel movements), stomach cramping, and sometimes blood in the bowel movements.    Changes in your central nervous system can happen. The central nervous system is made up of your brain and spinal cord. You could feel extreme tiredness, agitation, confusion, hallucinations (see or hear things that are not there), trouble understanding or speaking, loss of control of your bowels or bladder, eyesight changes, numbness, or lack of strength to your arms, legs, face, or body, and coma. If you start to have any of these symptoms let your doctor know right away.    Severe changes in your liver function, which can cause liver failure and be life-threatening.    This drug may affect some of your hormone glands (especially the thyroid , adrenals, pituitary, and pancreas).    Blood sugar levels may change, and you may  develop diabetes. If you already have diabetes, changes may need to be made to your diabetes medication.    Changes in your kidney function    Allergic skin reaction, which can be life-threatening. You may develop blisters on your skin that are filled with fluid or a severe red rash all over your body that may be painful.    While you are getting this drug in your vein (IV), you may have a reaction to the drug which can be life-threatening. Sometimes you may be given medication to stop or lessen these side effects. Your nurse will check you closely for these signs: fever or shaking chills, flushing, facial swelling, feeling dizzy, headache, trouble breathing, rash, itching, chest tightness, or chest pain. If  this happens, call 911 for emergency care.    Increased risk of serious complications which can be life-threatening such as graft versus host disease (GVHD) in patients who undergo a stem cell transplant before or after receiving durvalumab.    Increased risk of organ rejection in patients who have received donor organs. Note: Some of the side effects above are very rare. If you have concerns and/or questions, please discuss them with your medical team. Important Information    This drug may be present in the saliva, tears, sweat, urine, stool, vomit, semen, and vaginal secretions. Talk to your doctor and/or your nurse about the necessary precautions to take during this time.   Treating Side Effects   Manage tiredness by pacing your activities for the day.    Be sure to include periods of rest between energy-draining activities.    To help decrease the risk of bleeding, use a soft toothbrush. Check with your nurse before using dental floss.  Be very careful when using knives or tools.    Use an electric shaver instead of a razor.    Drink plenty of fluids (a minimum of eight glasses per day is recommended).    To help with nausea, eat small, frequent meals instead of three large meals a day. Choose foods and drinks that are at room temperature.    If you have diarrhea, eat low-fiber foods that are high in protein and calories, and avoid foods that can irritate your digestive tracts or lead to cramping. You should also drink more fluids so that you do not become dehydrated (lack of water  in the body from losing too much fluid).    Ask your doctor or nurse about medicine that is available to help stop or lessen diarrhea, and/or nausea.    If you have diabetes, keep good control of your blood sugar level. Tell your nurse or your doctor if your glucose levels are higher or lower than normal.    Keeping your pain under control is important to your well-being. Please tell your doctor or nurse if  you are experiencing pain.    If you get a rash do not put anything on it unless your doctor or nurse says you may. Keep the area around the rash clean and dry. Ask your doctor for medicine if your rash bothers you.    To help with hair loss, wash with a mild shampoo and avoid washing your hair every day. Avoid coloring your hair.    Avoid rubbing your scalp, pat your hair or scalp dry.    Limit your use of hair spray, electric curlers, blow dryers, and curling irons.    If you are interested in getting a wig, talk to your nurse and they can  help you get in touch with programs in your local area.    If you have numbness and tingling in your hands and feet, be careful when cooking, walking, and handling sharp objects and hot liquids.    Infusion reactions may happen after your infusion. If this happens, call 911 for emergency care.   Food and Drug Interactions   There are no known interactions of durvalumab with food.    This drug may interact with other medicines. Tell your doctor and pharmacist about all the prescription and over-the-counter medicines and dietary supplements (vitamins, minerals, herbs, and others) that you are taking at this time. Also, check with your doctor or pharmacist before starting any new prescription or over-the-counter medicines, or dietary supplements to make sure that there are no interactions.   When to Call the Doctor  Not all possible side effects are included. Some of these side effects, although rare, can be lifethreatening.   Lung problems:   Inflammation of the lungs   Cough   Trouble breathing   Upper respiratory tract infection Call your doctor or nurse if you have any of these symptoms:   Wheezing and/or trouble breathing   New or worsening cough   Coughing up yellow, green, or bloody mucus   Chest pain   Stomach problems:   Decreased appetite (decreased hunger)   Nausea and vomiting (throwing up)   Diarrhea (loose bowel movements)    Constipation (unable to move bowels)   Pain in your abdomen   Inflammation of your colon   Blood in your stool   Call your doctor or nurse if you have any of these symptoms:   Nausea that stops you from eating or drinking or is not relieved by prescribed medicine    Throwing up more than 3 times a day    Lasting loss of appetite or rapid weight loss of five pounds in a week    Diarrhea, 4 times in one day or diarrhea with lack of strength or a feeling of being dizzy    No bowel movement for 3 days or when you feel uncomfortable    Pain in your abdomen that does not go away    Blood in your stool (bright red, or black/tarry)   Liver problems:   Changes in your liver function   Call your doctor or nurse if you have any of these symptoms:   Yellowing of the eyes or skin    Dark urine    Pale bowel movements    Pain on the right side of your abdomen that does not go away    Feeling very tired and weak    Unusual itching     Easy bleeding or bruising   Hormone gland problems:   Changes in some of your hormone glands (especially the thyroid , adrenals, pituitary and pancreas)   Blood sugar levels may change, and you may develop diabetes   Call your doctor or nurse if you have any of these symptoms:   Headache that does not go away    Tiredness that interferes with your daily activities    Feeling dizzy or lightheaded    Changes in mood or behavior such as irritability and/or feeling forgetful    Shakiness    Weight loss or weight gain    Nausea    Abnormal blood sugar    Unusual thirst or passing urine often    Feeling cold   Kidney problems:   Changes in your kidney function  Urinary tract infection   Call your doctor or nurse if you have any of these symptoms:   Decreased or very dark urine    Cloudy urine and/or urine that smells bad    Difficulty urinating    Pain or burning when you pass urine    Feeling like you have to pass urine often, but  not much comes out when you do    Tender or heavy feeling in your lower abdomen    Pain on one side of your back under your ribs   Skin problems:   Rash and itching   Soreness of the mouth and throat   Allergic skin reaction   Call your doctor or nurse if you have any of these symptoms:   New rash and/or itching    Fluid-filled bumps/blisters    Rash that is not relieved by prescribed medicines    Red areas, white patches, or sores in your mouth that hurt   Inflammation of the brain:   Changes in your brain and spinal cord    Headache    Effects on the nerves   Call your doctor or nurse if you have any of these symptoms:   Headache that does not go away    Extreme tiredness, agitation, or confusion    Seizures    Hallucinations    Trouble understanding or speaking    Loss of control of bowels or bladder    Numbness or lack of strength to your arms, legs, face, or body    Numbness, tingling, pins, and needles, or pain in your arms, hands, legs, or feet   Other problems:   Low red blood cells, and platelets   Fever   Inflammation of your eye and/or other changes in vision   Allergic reaction to the drug   Heart problems   Electrolyte changes   Muscle, bone, and joint pain   Call your doctor or nurse if you have any of these symptoms:   Fever of 100.4 F (38 C) or higher    Chills, flushing    Easy bleeding or bruising    Blurred vision or other changes in eyesight    Sensitivity to light    Feeling that your heart is beating fast or in a not normal way (palpitations)    Signs of infusion reaction: fever or shaking chills, flushing, facial swelling, feeling dizzy, headache, trouble breathing, rash, itching, chest tightness, or chest pain. If this happens, call 911 for emergency care.    Pain that does not go away, or is not relieved by prescribed medicines    Extreme muscle weakness   Reproduction Warnings   Pregnancy warning: This drug can have  harmful effects on the unborn baby. Women of childbearing potential should use effective methods of birth control during your cancer treatment and for at least 3 months after stopping treatment. Let your doctor know right away if you think you may be pregnant.    Breastfeeding warning: Women should not breastfeed during treatment and for at least 3 months after stopping treatment because this drug could enter the breast milk and cause harm to a breastfeeding baby.    Fertility warning: Fertility studies have not been done with this drug. Talk with your doctor or nurse if you plan to have children. Ask for information on sperm or egg banking.  SELF CARE ACTIVITIES WHILE ON CHEMOTHERAPY/IMMUNOTHERAPY:  Hydration Increase your fluid intake 48 hours prior to treatment and drink at least 8 to  12 cups (64 ounces) of water /decaffeinated beverages per day after treatment. You can still have your cup of coffee or soda but these beverages do not count as part of your 8 to 12 cups that you need to drink daily. No alcohol intake.  Medications Continue taking your normal prescription medication as prescribed.  If you start any new herbal or new supplements please let us  know first to make sure it is safe.  Mouth Care Have teeth cleaned professionally before starting treatment. Keep dentures and partial plates clean. Use soft toothbrush and do not use mouthwashes that contain alcohol. Biotene is a good mouthwash that is available at most pharmacies or may be ordered by calling [phone]. Use warm salt water  gargles (1 teaspoon salt per 1 quart warm water ) before and after meals and at bedtime. Or you may rinse with 2 tablespoons of three-percent hydrogen peroxide mixed in eight ounces of water . If you are still having problems with your mouth or sores in your mouth please call the clinic. If you need dental work, please let the doctor know before you go for your appointment so that we can coordinate the best  possible time for you in regards to your chemo regimen. You need to also let your dentist know that you are actively taking chemo. We may need to do labs prior to your dental appointment.  Skin Care Always use sunscreen that has not expired and with SPF (Sun Protection Factor) of 50 or higher. Wear hats to protect your head from the sun. Remember to use sunscreen on your hands, ears, face, & feet.  Use good moisturizing lotions such as udder cream, eucerin, or even Vaseline. Some chemotherapies can cause dry skin, color changes in your skin and nails.    Avoid long, hot showers or baths. Use gentle, fragrance-free soaps and laundry detergent. Use moisturizers, preferably creams or ointments rather than lotions because the thicker consistency is better at preventing skin dehydration. Apply the cream or ointment within 15 minutes of showering. Reapply moisturizer at night, and moisturize your hands every time after you wash them.   Infection Prevention Please wash your hands for at least 30 seconds using warm soapy water . Handwashing is the #1 way to prevent the spread of germs. Stay away from sick people or people who are getting over a cold. If you develop respiratory systems such as green/yellow mucus production or productive cough or persistent cough let us  know and we will see if you need an antibiotic. It is a good idea to keep a pair of gloves on when going into grocery stores/[employer] to decrease your risk of coming into contact with germs on the carts, etc. Carry alcohol hand gel with you at all times and use it frequently if out in public. If your temperature reaches 100.5 or higher please call the clinic and let us  know.  If it is after hours or on the weekend please go to the ER if your temperature is over 100.4.  Please have your own personal thermometer at home to use.    Sex and bodily fluids If you are going to have sex, a condom must be used to protect the person that isn't taking  immunotherapy. For a few days after treatment, immunotherapy can be excreted through your bodily fluids.  When using the toilet please close the lid and flush the toilet twice.  Do this for a few day after you have had immunotherapy.   Contraception It is not known for  sure whether or not immunotherapy drugs can be passed on through semen or secretions from the vagina. Because of this some doctors advise people to use a barrier method if you have sex during treatment. This applies to vaginal, anal or oral sex.  Generally, doctors advise a barrier method only for the time you are actually having the treatment and for about a week after your treatment.  Advice like this can be worrying, but this does not mean that you have to avoid being intimate with your partner. You can still have close contact with your partner and continue to enjoy sex.  Animals If you have cats or birds we just ask that you not change the litter or change the cage.  Please have someone else do this for you while you are on immunotherapy.   Food Safety During and After Cancer Treatment Food safety is important for people both during and after cancer treatment. Cancer and cancer treatments, such as chemotherapy, radiation therapy, and stem cell/bone marrow transplantation, often weaken the immune system. This makes it harder for your body to protect itself from foodborne illness, also called food poisoning. Foodborne illness is caused by eating food that contains harmful bacteria, parasites, or viruses.  Foods to avoid Some foods have a higher risk of becoming tainted with bacteria. These include: Unwashed fresh fruit and vegetables, especially leafy vegetables that can hide dirt and other contaminants Raw sprouts, such as alfalfa sprouts Raw or undercooked beef, especially ground beef, or other raw or undercooked meat and poultry Fatty, fried, or spicy foods immediately before or after treatment.  These can sit heavy on your  stomach and make you feel nauseous. Raw or undercooked shellfish, such as oysters. Sushi and sashimi, which often contain raw fish.  Unpasteurized beverages, such as unpasteurized fruit juices, raw milk, raw yogurt, or cider Undercooked eggs, such as soft boiled, over easy, and poached; raw, unpasteurized eggs; or foods made with raw egg, such as homemade raw cookie dough and homemade mayonnaise  Simple steps for food safety  Shop smart. Do not buy food stored or displayed in an unclean area. Do not buy bruised or damaged fruits or vegetables. Do not buy cans that have cracks, dents, or bulges. Pick up foods that can spoil at the end of your shopping trip and store them in a cooler on the way home.  Prepare and clean up foods carefully. Rinse all fresh fruits and vegetables under running water , and dry them with a clean towel or paper towel. Clean the top of cans before opening them. After preparing food, wash your hands for 20 seconds with hot water  and soap. Pay special attention to areas between fingers and under nails. Clean your utensils and dishes with hot water  and soap. Disinfect your kitchen and cutting boards using 1 teaspoon of liquid, unscented bleach mixed into 1 quart of water .    Dispose of old food. Eat canned and packaged food before its expiration date (the "use by" or "best before" date). Consume refrigerated leftovers within 3 to 4 days. After that time, throw out the food. Even if the food does not smell or look spoiled, it still may be unsafe. Some bacteria, such as Listeria, can grow even on foods stored in the refrigerator if they are kept for too long.  Take precautions when eating out. At restaurants, avoid buffets and salad bars where food sits out for a long time and comes in contact with many people. Food can become contaminated when  someone with a virus, often a norovirus, or another "bug" handles it. Put any leftover food in a "to-go" container yourself,  rather than having the server do it. And, refrigerate leftovers as soon as you get home. Choose restaurants that are clean and that are willing to prepare your food as you order it cooked.    SYMPTOMS TO REPORT AS SOON AS POSSIBLE AFTER TREATMENT:  FEVER GREATER THAN 100.4 F CHILLS WITH OR WITHOUT FEVER NAUSEA AND VOMITING THAT IS NOT CONTROLLED WITH YOUR NAUSEA MEDICATION UNUSUAL SHORTNESS OF BREATH UNUSUAL BRUISING OR BLEEDING TENDERNESS IN MOUTH AND THROAT WITH OR WITHOUT PRESENCE OF ULCERS URINARY PROBLEMS BOWEL PROBLEMS UNUSUAL RASH     Wear comfortable clothing and clothing appropriate for easy access to any Portacath. Let us  know if there is anything that we can do to make your therapy better!   What to do if you need assistance after hours or on the weekends: CALL [phone].  HOLD on the line, do not hang up.  You will hear multiple messages but at the end you will be connected with a nurse triage line.  They will contact the doctor if necessary.  Most of the time they will be able to assist you.  Do not call the hospital operator.    I have been informed and understand all of the instructions given to me and have received a copy. I have been instructed to call the clinic [phone] or my family physician as soon as possible for continued medical care, if indicated. I do not have any more questions at this time but understand that I may call the Cancer Center or the Patient Navigator at [phone] during office hours should I have questions or need assistance in obtaining follow-up care.

## 2024-07-19 NOTE — Progress Notes (Signed)
 Patient Care Team: [name], MD as PCP - General (Internal Medicine) Francyne Headland, MD as PCP - Cardiology (Cardiology) Beuford Anes, MD as Consulting Physician (Orthopedic Surgery) Devere Lonni Righter, MD as Consulting Physician (Urology) Skeet Juliene SAUNDERS, DO as Consulting Physician (Neurology)  Clinic Day:  07/19/2024  Referring physician: [name], MD   CHIEF COMPLAINT:  CC: Gastric adenocarcinoma    ASSESSMENT & PLAN:   Assessment & Plan: Raymond Barrera.  is a 63 y.o. male with Gastric adenocarcinoma   Assessment & Plan Malignant neoplasm of stomach, unspecified location [facility]) Newly diagnosed gastric adenocarcinoma via biopsy of ulcer base on enteroscopy.  Oncology history as below Likely stage I-T2N0M0 (T2-T3 per EUS report) PET scan with no evidence of metastatic disease Evaluated by surgical oncology at [facility] and recommended to follow-up after neoadjuvant chemoimmunotherapy   -Discussed with patient that this stage of gastric adenocarcinoma usually requires perioperative chemoimmunotherapy with D-FLOT regimen per MATTERHORN trial which showed significant PFS benefit. -Considering patient's peripheral neuropathy, would likely dose reduce oxaliplatin after a short period of trial. - Will have a chemo education session scheduled  - Discussed side effects of the chemoimmunotherapy in detail.  Side effects to include nausea, vomiting, diarrhea, loss of appetite, hair loss, peripheral neuropathy.  Immunotherapy side effects to include rash, thyroid  abnormalities, pneumonitis, hepatitis, diarrhea. - Patient is not in agreement to proceed with treatment at this time.  Will plan to start next week. - Will repeat a PET scan to get a baseline prior to start of chemoimmunotherapy  Return to clinic prior to second cycle of chemotherapy to assess for tolerability Renal cell carcinoma of right kidney [facility]) Patient has a history of left kidney renal cell  carcinoma s/p nephrectomy in 2020 Stage pT1a with no metastasis.  - Currently in remission - Unknown if patient follows with urology at this time Iron  deficiency anemia due to chronic blood loss Iron  deficiency anemia likely secondary to bleeding from stomach cancer.  Improved with iron  supplementation. S/p 1 g Monoferric on 05/10/2024 Symptomatically feels significantly better  - Continue taking iron  pills every other day.  Use MiraLAX  for constipation Tobacco use Patient is a chronic smoker and is currently smoking 1 to 2 cigarettes/day  - Encouraged his attempts to abstain from smoking Stage 3b chronic kidney disease (HCC) Chronic kidney disease likely secondary to only 1 functioning kidney Follows with Dr. 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 Has some difficulty holding his phone in his right hand  - Will have caution while administering platinum based chemotherapy. - Continue taking gabapentin  and follow-up with primary care Submandibular swelling Patient has submandibular swelling on the left side  Ultrasound showed a complex cyst. Resolved at this time   The patient understands the plans discussed today and is in agreement with them.  He knows to contact our office if he develops concerns prior to his next appointment.  I provided 30 minutes of face-to-face time during this encounter and > 50% was spent counseling as documented under my assessment and plan.    LILLETTE Verneta SAUNDERS Teague,acting as a Neurosurgeon for [name], MD.,have documented all relevant documentation on the behalf of [name], MD,as directed by  [name], MD while in the presence of [name], MD.  I, [name] MD, have reviewed the above documentation for accuracy and completeness, and I agree with the above.     [name], MD    CANCER CENTER [facility] - A DEPT OF [name]  [facility] [address] [place] [address] Dept: [phone] Dept Fax: [phone]   Orders Placed This Encounter  Procedures   NM PET Image Restag (PS) Skull Base To Thigh    Standing Status:   Future    Number of Occurrences:   1    Expected Date:   07/21/2024    Expiration Date:   07/19/2025    If indicated for the ordered procedure, I authorize the administration of a radiopharmaceutical per Radiology protocol:   Yes    Preferred imaging location?:   [name]   CBC with Differential    Standing Status:   Future    Expected Date:   07/27/2024    Expiration Date:   07/27/2025   Comprehensive metabolic panel    Standing Status:   Future    Expected Date:   07/27/2024    Expiration Date:   07/27/2025   CBC with Differential    Standing Status:   Future    Expected Date:   08/10/2024    Expiration Date:   08/10/2025   Comprehensive metabolic panel    Standing Status:   Future    Expected Date:   08/10/2024    Expiration Date:   08/10/2025   T4    Standing Status:   Future    Expected Date:   07/27/2024    Expiration Date:   07/27/2025   TSH    Standing Status:   Future    Expected Date:   07/27/2024    Expiration Date:   07/27/2025   Magnesium     Standing Status:   Future    Expected Date:   07/27/2024    Expiration Date:   07/27/2025   Magnesium     Standing Status:   Future    Expected Date:   08/10/2024    Expiration Date:   08/10/2025     ONCOLOGY HISTORY:   Oncology History  Gastric adenocarcinoma (HCC)  04/19/2024 Pathology Results   A. STOMACH, ABNORMAL MUCOSA BIOPSY:   Invasive moderately differentiated adenocarcinoma  Reactive gastropathy with intestinal metaplasia showing focal dysplasia  Negative for H. pylori    04/29/2024 Initial Diagnosis   Gastric adenocarcinoma (HCC)   05/11/2024 Pathology Results   Caris tissue testing:  CLDN18: Positive PD-L1: Positive, CPS: 10 HER2: Negative  MSI: Stable, MMR: Proficient, TMB: Low, 5  mut/Mb  BRAF, RET, NTRK 1/2/3, ARID1A, BCR, CCN E1, CDH 1, EBER, EGFR, FGFR 2, KRAS, MET, PIK3CA, RHOA: Negative   05/19/2024 PET scan   IMPRESSION: Only slight area of asymmetric uptake along the midbody of the stomach with an adjacent metallic clip. Please correlate for the exact location patient's ulcer and neoplasm as reported on endoscopy. No additional areas of abnormal uptake at this time.   06/15/2024 Procedure   EUS for staging:  Staging gastric adenocarcinoma at least T2 (maybe early T3) N0 Mx by EUS.   06/15/2024 Cancer Staging   Staging form: Stomach, AJCC 8th Edition - Clinical stage from 06/15/2024: Stage I (cT2, cN0, cM0) - Signed by Davonna Siad, MD on 06/26/2024 Stage prefix: Initial diagnosis Total positive nodes: 0   06/23/2024 Procedure   IR guided port placement   07/27/2024 -  Chemotherapy   Patient is on Treatment Plan : GASTROESOPHAGEAL FLOT q14d + Imfinzi 1500 mg q 28 days     Renal cell carcinoma (HCC)  03/30/2019 Cancer Staging  Staging form: Kidney, AJCC 8th Edition - Clinical stage from 03/30/2019: Stage I (cT1a, cN0, cM0) - Signed by Davonna Siad, MD on 05/03/2024 Stage prefix: Initial diagnosis   05/03/2024 Initial Diagnosis   Renal cell carcinoma (HCC)      Current Treatment: Neoadjuvant chemotherapy with D-FLOT  INTERVAL HISTORY:  Shante Maysonet. is here today for follow up. Patient is accompanied by his wife and mother today. He notes a normal appetite and appears well today. Mauri is currently smoking 1-2 cigarettes a day.  He is overall doing well and has no other complaints.  I have reviewed the past medical history, past surgical history, social history and family history with the patient and they are unchanged from previous note.  ALLERGIES:  is allergic to poison ivy extract [poison ivy extract] and coreg  [carvedilol ].  MEDICATIONS:  Current Outpatient Medications  Medication Sig Dispense Refill   Accu-Chek Softclix Lancets  lancets SMARTSIG:Topical 2-3 Times Daily     acetaminophen  (TYLENOL ) 325 MG tablet Take 2 tablets (650 mg total) by mouth every 6 (six) hours as needed for mild pain (pain score 1-3) (or Fever >/= 101).     albuterol  (VENTOLIN  HFA) 108 (90 Base) MCG/ACT inhaler Inhale 2 puffs into the lungs every 6 (six) hours as needed for wheezing or shortness of breath (cough). 8 g 2   amLODipine  (NORVASC ) 2.5 MG tablet Take 1 tablet (2.5 mg total) by mouth daily. 90 tablet 3   aspirin  EC 81 MG tablet Take 1 tablet (81 mg total) by mouth daily with breakfast. Swallow whole. 30 tablet 12   atorvastatin  (LIPITOR) 40 MG tablet Take 1 tablet (40 mg total) by mouth daily. 90 tablet 3   Camphor-Menthol -Methyl Sal (SALONPAS EX) Apply topically. As needed     DULoxetine  (CYMBALTA ) 30 MG capsule Take 1 capsule (30 mg total) by mouth 2 (two) times daily. 60 capsule 3   ferrous sulfate  325 (65 FE) MG EC tablet Take 1 tablet (325 mg total) by mouth every Tuesday, Thursday, Saturday, and Sunday. 45 tablet 3   gabapentin  (NEURONTIN ) 300 MG capsule Take 1 capsule (300 mg total) by mouth at bedtime. 90 capsule 1   hydrOXYzine  (ATARAX ) 25 MG tablet Take 1 tablet (25 mg total) by mouth 3 (three) times daily as needed. 30 tablet 0   JARDIANCE  10 MG TABS tablet Take 1 tablet (10 mg total) by mouth daily. 30 tablet 3   pantoprazole  (PROTONIX ) 40 MG tablet TAKE 1 TABLET(40 MG) BY MOUTH TWICE DAILY 60 tablet 2   polyethylene glycol powder (GLYCOLAX /MIRALAX ) 17 GM/SCOOP powder Take 8.5 g by mouth daily.     rOPINIRole  (REQUIP ) 2 MG tablet Take 1 tablet (2 mg total) by mouth at bedtime. 30 tablet 1   umeclidinium-vilanterol (ANORO ELLIPTA ) 62.5-25 MCG/ACT AEPB Inhale 1 puff into the lungs daily. 60 each 3   cyanocobalamin  (VITAMIN B12) 1000 MCG tablet Take 1,000 mcg by mouth daily.     [START ON 07/27/2024] dextrose  5 % SOLN 1,000 mL with fluorouracil 5 GM/100ML SOLN Inject into the vein every 14 (fourteen) days.     [START ON 07/27/2024]  DOCETAXEL IV Inject into the vein every 14 (fourteen) days.     [START ON 07/27/2024] Durvalumab (IMFINZI IV) Inject into the vein every 28 (twenty-eight) days.     [START ON 07/27/2024] LEUCOVORIN CALCIUM  IV Inject into the vein every 14 (fourteen) days.     [START ON 07/27/2024] OXALIPLATIN IV Inject into the vein every 14 (fourteen) days.  No current facility-administered medications for this visit.    REVIEW OF SYSTEMS:   Constitutional: Denies fevers, chills or abnormal weight loss Eyes: Denies blurriness of vision Ears, nose, mouth, throat, and face: Denies mucositis or sore throat Respiratory: Denies cough, dyspnea or wheezes Cardiovascular: Denies palpitation, chest discomfort or lower extremity swelling Gastrointestinal:  Denies nausea, heartburn or change in bowel habits Skin: Denies abnormal skin rashes Lymphatics: Denies new lymphadenopathy or easy bruising Neurological: Positive for dizziness, headaches, and numbness in hands, Denies numbness, tingling or new weaknesses Behavioral/Psych: Positive for depression All other systems were reviewed with the patient and are negative.   VITALS:  Blood pressure (!) 143/92, pulse 70, temperature 97.7 F (36.5 C), resp. rate 18, weight 239 lb 3.2 oz (108.5 kg), SpO2 98%.  Wt Readings from Last 3 Encounters:  07/19/24 239 lb 3.2 oz (108.5 kg)  06/23/24 235 lb (106.6 kg)  06/22/24 236 lb 8 oz (107.3 kg)    Body mass index is 27.64 kg/m.  Performance status (ECOG): 1 - Symptomatic but completely ambulatory  PHYSICAL EXAM:   GENERAL:alert, no distress and comfortable SKIN: skin color, texture, turgor are normal, no rashes or significant lesions LYMPH:  no palpable lymphadenopathy in the cervical, axillary or inguinal LUNGS: clear to auscultation and percussion with normal breathing effort HEART: regular rate & rhythm and no murmurs and no lower extremity edema ABDOMEN:abdomen soft, non-tender and normal bowel  sounds Musculoskeletal:no cyanosis of digits and no clubbing  NEURO: alert & oriented x 3 with fluent speech  LABORATORY DATA:  I have reviewed the data as listed    Component Value Date/Time   NA 137 05/27/2024 1350   NA 139 01/26/2024 1620   K 4.6 05/27/2024 1350   CL 104 05/27/2024 1350   CO2 22 05/27/2024 1350   GLUCOSE 120 (H) 05/27/2024 1350   BUN 27 (H) 05/27/2024 1350   BUN 28 (H) 01/26/2024 1620   CREATININE 1.88 (H) 05/27/2024 1350   CALCIUM  11.3 (H) 05/27/2024 1350   PROT 7.8 05/27/2024 1350   PROT 7.2 01/01/2024 1012   ALBUMIN  3.9 05/27/2024 1350   ALBUMIN  4.4 01/01/2024 1012   AST 20 05/27/2024 1350   ALT 32 05/27/2024 1350   ALKPHOS 92 05/27/2024 1350   BILITOT 0.9 05/27/2024 1350   BILITOT <0.2 01/01/2024 1012   GFRNONAA 39 (L) 05/27/2024 1350   GFRAA 40 (L) 04/12/2020 1425    Lab Results  Component Value Date   WBC 8.9 05/27/2024   NEUTROABS 7.1 05/27/2024   HGB 12.7 (L) 05/27/2024   HCT 40.5 05/27/2024   MCV 85.8 05/27/2024   PLT 293 05/27/2024      Chemistry      Component Value Date/Time   NA 137 05/27/2024 1350   NA 139 01/26/2024 1620   K 4.6 05/27/2024 1350   CL 104 05/27/2024 1350   CO2 22 05/27/2024 1350   BUN 27 (H) 05/27/2024 1350   BUN 28 (H) 01/26/2024 1620   CREATININE 1.88 (H) 05/27/2024 1350      Component Value Date/Time   CALCIUM  11.3 (H) 05/27/2024 1350   ALKPHOS 92 05/27/2024 1350   AST 20 05/27/2024 1350   ALT 32 05/27/2024 1350   BILITOT 0.9 05/27/2024 1350   BILITOT <0.2 01/01/2024 1012       RADIOGRAPHIC STUDIES: I have personally reviewed the radiological images as listed and agreed with the findings in the report.  No recent scans to review

## 2024-07-19 NOTE — Assessment & Plan Note (Addendum)
 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 Has some difficulty holding his phone in his right hand  - Will have caution while administering platinum based chemotherapy. - Continue taking gabapentin  and follow-up with primary care

## 2024-07-19 NOTE — Patient Instructions (Signed)
 [facility] at [facility] Discharge Instructions   You were seen and examined today by Dr. Davonna.  She reviewed the results of your biopsy which shows gastric cancer.   The treatment regimen consists of several chemotherapy medications. Those are Taxotere, fluorouracil (5FU), and oxaliplatin. There is another drug that is given called leucovorin. This is a vitamin that helps the 5FU work better. Treatment is given every 2 weeks. There is an ambulatory pump you will wear home for 24 hours that administers the 5FU. The pump is placed in the clinic and then you come back the next day to the clinic to have the pump removed. There is also an immunotherapy drug called Imfinzi that you will receive along with treatment.    We will arrange for you to have a chemo education session prior to starting treatment.   Return as scheduled.    Thank you for choosing [facility] at [facility] to provide your oncology and hematology care.  To afford each patient quality time with our provider, please arrive at least 15 minutes before your scheduled appointment time.   If you have a lab appointment with the Cancer Center please come in thru the Main Entrance and check in at the main information desk.  You need to re-schedule your appointment should you arrive 10 or more minutes late.  We strive to give you quality time with our providers, and arriving late affects you and other patients whose appointments are after yours.  Also, if you no show three or more times for appointments you may be dismissed from the clinic at the providers discretion.     Again, thank you for choosing [facility].  Our hope is that these requests will decrease the amount of time that you wait before being seen by our physicians.       _____________________________________________________________  Should you have questions after your visit to [facility], please  contact our office at [phone] and follow the prompts.  Our office hours are 8:00 a.m. and 4:30 p.m. Monday - Friday.  Please note that voicemails left after 4:00 p.m. may not be returned until the following business day.  We are closed weekends and major holidays.  You do have access to a nurse 24-7, just call the main number to the clinic [phone] and do not press any options, hold on the line and a nurse will answer the phone.    For prescription refill requests, have your pharmacy contact our office and allow 72 hours.    Due to Covid, you will need to wear a mask upon entering the hospital. If you do not have a mask, a mask will be given to you at the Main Entrance upon arrival. For doctor visits, patients may have 1 support person age 2 or older with them. For treatment visits, patients can not have anyone with them due to social distancing guidelines and our immunocompromised population.

## 2024-07-19 NOTE — Assessment & Plan Note (Addendum)
 Newly diagnosed gastric adenocarcinoma via biopsy of ulcer base on enteroscopy.  Oncology history as below Likely stage I-T2N0M0 (T2-T3 per EUS report) PET scan with no evidence of metastatic disease Evaluated by surgical oncology at [facility] and recommended to follow-up after neoadjuvant chemoimmunotherapy   -Discussed with patient that this stage of gastric adenocarcinoma usually requires perioperative chemoimmunotherapy with D-FLOT regimen per MATTERHORN trial which showed significant PFS benefit. -Considering patient's peripheral neuropathy, would likely dose reduce oxaliplatin after a short period of trial. - Will have a chemo education session scheduled  - Discussed side effects of the chemoimmunotherapy in detail.  Side effects to include nausea, vomiting, diarrhea, loss of appetite, hair loss, peripheral neuropathy.  Immunotherapy side effects to include rash, thyroid  abnormalities, pneumonitis, hepatitis, diarrhea. - Patient is not in agreement to proceed with treatment at this time.  Will plan to start next week. - Will repeat a PET scan to get a baseline prior to start of chemoimmunotherapy  Return to clinic prior to second cycle of chemotherapy to assess for tolerability

## 2024-07-19 NOTE — Progress Notes (Signed)
 START ON PATHWAY REGIMEN - Gastroesophageal     A cycle is every 14 days:     Docetaxel      Oxaliplatin      Leucovorin      Fluorouracil   **Always confirm dose/schedule in your pharmacy ordering system**  Patient Characteristics: Gastric, Adenocarcinoma, Preoperative or Nonsurgical Candidate, M0 (Clinical Staging), Stage I-III (cT1-T4a, any N), Surgical Candidate, cT3-cT4a or cN+, MSS/pMMR or MSI Unknown Therapeutic Status: Preoperative or Nonsurgical Candidate, M0 (Clinical Staging) Histology: Adenocarcinoma Disease Classification: Gastric AJCC N Category: cN0 AJCC M Category: cM0 AJCC 8 Stage Grouping: IIB AJCC T Category: cT3 Microsatellite/Mismatch Repair Status: MSS/pMMR Intent of Therapy: Curative Intent, Discussed with Patient

## 2024-07-19 NOTE — Assessment & Plan Note (Signed)
 Newly diagnosed gastric adenocarcinoma via biopsy of ulcer base on enteroscopy.  Oncology history as below Unknown stage at this time PET scan with no evidence of metastatic disease  - Will reach out to Dr. Burnette for probable EUS the week of 06/06/2024 - Discussed with patient and his wife that based on stage, patient might require a combination of chemotherapy, surgery with or without radiation  Return to clinic 1 week after EUS with Dr. Burnette to discuss results and further management

## 2024-07-20 ENCOUNTER — Inpatient Hospital Stay: Admitting: Licensed Clinical Social Worker

## 2024-07-20 ENCOUNTER — Other Ambulatory Visit: Payer: Self-pay

## 2024-07-20 DIAGNOSIS — C169 Malignant neoplasm of stomach, unspecified: Secondary | ICD-10-CM

## 2024-07-20 NOTE — Progress Notes (Signed)
 [facility] Clinical Social Work  Initial Assessment   Raymond Barrera. is a 63 y.o. year old male contacted by phone, pt and wife present by speaker phone on call. Clinical Social Work was referred by medical provider for assessment of psychosocial needs.   SDOH (Social Determinants of Health) assessments performed: Yes SDOH Interventions    Flowsheet Row Office Visit from 05/02/2024 in [facility] Office Visit from 04/29/2024 in [facility]  SDOH Interventions    Food Insecurity Interventions Intervention Not Indicated --  Housing Interventions Intervention Not Indicated --  Transportation Interventions Intervention Not Indicated --  Utilities Interventions Intervention Not Indicated --  Depression Interventions/Treatment  -- Medication, Counseling, Currently on Treatment    SDOH Screenings   Food Insecurity: No Food Insecurity (05/02/2024)  Housing: Low Risk  (05/02/2024)  Transportation Needs: No Transportation Needs (05/02/2024)  Utilities: Not At Risk (05/02/2024)  Depression (PHQ2-9): Low Risk  (07/19/2024)  Recent Concern: Depression (PHQ2-9) - High Risk (04/29/2024)  Financial Resource Strain: Medium Risk (12/28/2023)  Physical Activity: Insufficiently Active (12/28/2023)  Social Connections: Unknown (03/01/2024)  Stress: Stress Concern Present (12/28/2023)  Tobacco Use: High Risk (07/19/2024)    PHQ 2/9:    07/19/2024   11:12 AM 06/22/2024    1:05 PM 05/27/2024    1:17 PM  Depression screen PHQ 2/9  Decreased Interest 0 0 0  Down, Depressed, Hopeless 1 0 0  PHQ - 2 Score 1 0 0     Distress Screen completed: No     No data to display            Family/Social Information:  Housing Arrangement: patient lives with his wife and his mother resides upstairs.  Family members/support persons in your life? Per pt spouse and his mother will primarily be his support.  Both are available to assist as  needed. Transportation concerns: no, per spouse pt will take RCATs and if not able to take RCATs pt's mother will drive him to appointments  Employment: Retired .  Income source: Actor concerns: Yes, current concerns Type of concern: Utilities and Rent/ mortgage Food access concerns: no Religious or spiritual practice: Yes-Christian Advanced directives: no, pt informed of ability to complete documents and will schedule an appointment at a later time to do so.  Services Currently in place:  none  Coping/ Adjustment to diagnosis: Patient understands treatment plan and what happens next? yes Concerns about diagnosis and/or treatment: Overwhelmed by information, Afraid of cancer, and Quality of life Patient reported stressors: Finances, Anxiety/ nervousness, and Adjusting to my illness Hopes and/or priorities: pt's priority is to start treatment w/ the hope of positive results Patient enjoys time with family/ friends Current coping skills/ strengths: Capable of independent living , Motivation for treatment/growth , Physical Health , and Supportive family/friends     SUMMARY: Current SDOH Barriers:  Financial constraints related to fixed income  Clinical Social Work Clinical Goal(s):  Explore community resource options for unmet needs related to:  Financial Strain   Interventions: Discussed common feeling and emotions when being diagnosed with cancer, and the importance of support during treatment Informed patient of the support team roles and support services at [facility] Provided CSW contact information and encouraged patient to call with any questions or concerns Referred patient to community resources: Wadie Rung.  Informed pt of the [employer] and the process to apply.  [name] spouse confirmed they  do receive food stamps and meet presumptive eligibility requirements.  Informed of possibility to complete and notarize advanced directives.    Follow Up Plan:  Patient will contact CSW with any support or resource needs Patient verbalizes understanding of plan: Yes    Devere JONELLE Manna, LCSW Clinical Social Worker [facility]

## 2024-07-21 ENCOUNTER — Ambulatory Visit (HOSPITAL_COMMUNITY)
Admission: RE | Admit: 2024-07-21 | Discharge: 2024-07-21 | Disposition: A | Discharge status: Home / Self Care | Source: Ambulatory Visit | Attending: Oncology | Admitting: Oncology

## 2024-07-21 DIAGNOSIS — C169 Malignant neoplasm of stomach, unspecified: Secondary | ICD-10-CM | POA: Diagnosis not present

## 2024-07-21 DIAGNOSIS — I2510 Atherosclerotic heart disease of native coronary artery without angina pectoris: Secondary | ICD-10-CM | POA: Diagnosis not present

## 2024-07-21 DIAGNOSIS — I700 Atherosclerosis of aorta: Secondary | ICD-10-CM | POA: Diagnosis not present

## 2024-07-21 MED ORDER — FLUDEOXYGLUCOSE F - 18 (FDG) INJECTION
12.9900 | Freq: Once | INTRAVENOUS | Status: AC | PRN
  Administered 2024-07-21: 12.99 via INTRAVENOUS

## 2024-07-22 ENCOUNTER — Other Ambulatory Visit (HOSPITAL_COMMUNITY): Payer: Self-pay | Admitting: Nephrology

## 2024-07-22 DIAGNOSIS — N1832 Chronic kidney disease, stage 3b: Secondary | ICD-10-CM | POA: Diagnosis not present

## 2024-07-22 DIAGNOSIS — D638 Anemia in other chronic diseases classified elsewhere: Secondary | ICD-10-CM | POA: Diagnosis not present

## 2024-07-22 DIAGNOSIS — G4733 Obstructive sleep apnea (adult) (pediatric): Secondary | ICD-10-CM | POA: Diagnosis not present

## 2024-07-22 DIAGNOSIS — N183 Chronic kidney disease, stage 3 unspecified: Secondary | ICD-10-CM

## 2024-07-22 NOTE — Progress Notes (Signed)
 Pharmacy Quality Measure Review  This patient is appearing on a report for being at risk of failing the adherence measure for diabetes medications this calendar year.   Medication: Jardiance  10 mg daily Last fill date: 07/17/2024 for 30 day supply  Insurance report was not up to date. No action needed at this time.   Woodie Jock, PharmD PGY1 Pharmacy Resident

## 2024-07-27 ENCOUNTER — Inpatient Hospital Stay

## 2024-07-27 ENCOUNTER — Other Ambulatory Visit: Admitting: Licensed Clinical Social Worker

## 2024-07-27 VITALS — BP 165/96 | HR 77 | Temp 97.6°F | Resp 18

## 2024-07-27 DIAGNOSIS — C169 Malignant neoplasm of stomach, unspecified: Secondary | ICD-10-CM

## 2024-07-27 DIAGNOSIS — Z79620 Long term (current) use of immunosuppressive biologic: Secondary | ICD-10-CM | POA: Diagnosis not present

## 2024-07-27 DIAGNOSIS — Z79631 Long term (current) use of antimetabolite agent: Secondary | ICD-10-CM | POA: Diagnosis not present

## 2024-07-27 DIAGNOSIS — R220 Localized swelling, mass and lump, head: Secondary | ICD-10-CM | POA: Diagnosis not present

## 2024-07-27 DIAGNOSIS — T451X5A Adverse effect of antineoplastic and immunosuppressive drugs, initial encounter: Secondary | ICD-10-CM | POA: Diagnosis not present

## 2024-07-27 DIAGNOSIS — Z79630 Long term (current) use of alkylating agent: Secondary | ICD-10-CM | POA: Diagnosis not present

## 2024-07-27 DIAGNOSIS — Z79633 Long term (current) use of mitotic inhibitor: Secondary | ICD-10-CM | POA: Diagnosis not present

## 2024-07-27 DIAGNOSIS — N1832 Chronic kidney disease, stage 3b: Secondary | ICD-10-CM | POA: Diagnosis not present

## 2024-07-27 DIAGNOSIS — E1142 Type 2 diabetes mellitus with diabetic polyneuropathy: Secondary | ICD-10-CM | POA: Diagnosis not present

## 2024-07-27 DIAGNOSIS — F17210 Nicotine dependence, cigarettes, uncomplicated: Secondary | ICD-10-CM | POA: Diagnosis not present

## 2024-07-27 DIAGNOSIS — E1122 Type 2 diabetes mellitus with diabetic chronic kidney disease: Secondary | ICD-10-CM | POA: Diagnosis not present

## 2024-07-27 DIAGNOSIS — K5900 Constipation, unspecified: Secondary | ICD-10-CM | POA: Diagnosis not present

## 2024-07-27 DIAGNOSIS — Z8551 Personal history of malignant neoplasm of bladder: Secondary | ICD-10-CM | POA: Diagnosis not present

## 2024-07-27 DIAGNOSIS — D500 Iron deficiency anemia secondary to blood loss (chronic): Secondary | ICD-10-CM | POA: Diagnosis not present

## 2024-07-27 DIAGNOSIS — G620 Drug-induced polyneuropathy: Secondary | ICD-10-CM | POA: Diagnosis not present

## 2024-07-27 LAB — COMPREHENSIVE METABOLIC PANEL WITH GFR
ALT: 21 U/L (ref 0–44)
AST: 20 U/L (ref 15–41)
Albumin: 3.6 g/dL (ref 3.5–5.0)
Alkaline Phosphatase: 99 U/L (ref 38–126)
Anion gap: 10 (ref 5–15)
BUN: 36 mg/dL — ABNORMAL HIGH (ref 8–23)
CO2: 25 mmol/L (ref 22–32)
Calcium: 11 mg/dL — ABNORMAL HIGH (ref 8.9–10.3)
Chloride: 104 mmol/L (ref 98–111)
Creatinine, Ser: 1.82 mg/dL — ABNORMAL HIGH (ref 0.61–1.24)
GFR, Estimated: 41 mL/min — ABNORMAL LOW (ref >60)
Glucose, Bld: 186 mg/dL — ABNORMAL HIGH (ref 70–99)
Potassium: 4.3 mmol/L (ref 3.5–5.1)
Sodium: 139 mmol/L (ref 135–145)
Total Bilirubin: 0.4 mg/dL (ref 0.0–1.2)
Total Protein: 7.1 g/dL (ref 6.5–8.1)

## 2024-07-27 LAB — CBC WITH DIFFERENTIAL/PLATELET
Abs Immature Granulocytes: 0.01 K/uL (ref 0.00–0.07)
Basophils Absolute: 0 K/uL (ref 0.0–0.1)
Basophils Relative: 0 %
Eosinophils Absolute: 0.2 K/uL (ref 0.0–0.5)
Eosinophils Relative: 3 %
HCT: 36 % — ABNORMAL LOW (ref 39.0–52.0)
Hemoglobin: 11.2 g/dL — ABNORMAL LOW (ref 13.0–17.0)
Immature Granulocytes: 0 %
Lymphocytes Relative: 20 %
Lymphs Abs: 1.2 K/uL (ref 0.7–4.0)
MCH: 28.1 pg (ref 26.0–34.0)
MCHC: 31.1 g/dL (ref 30.0–36.0)
MCV: 90.2 fL (ref 80.0–100.0)
Monocytes Absolute: 0.4 K/uL (ref 0.1–1.0)
Monocytes Relative: 7 %
Neutro Abs: 4 K/uL (ref 1.7–7.7)
Neutrophils Relative %: 70 %
Platelets: 212 K/uL (ref 150–400)
RBC: 3.99 MIL/uL — ABNORMAL LOW (ref 4.22–5.81)
RDW: 15.9 % — ABNORMAL HIGH (ref 11.5–15.5)
WBC: 5.8 K/uL (ref 4.0–10.5)
nRBC: 0 % (ref 0.0–0.2)

## 2024-07-27 LAB — TSH: TSH: 0.68 u[IU]/mL (ref 0.350–4.500)

## 2024-07-27 LAB — MAGNESIUM: Magnesium: 2.1 mg/dL (ref 1.7–2.4)

## 2024-07-27 MED ORDER — LEUCOVORIN CALCIUM INJECTION 350 MG
200.0000 mg/m2 | Freq: Once | INTRAVENOUS | Status: AC
  Administered 2024-07-27: 488 mg via INTRAVENOUS
  Filled 2024-07-27: qty 24.4

## 2024-07-27 MED ORDER — PROCHLORPERAZINE MALEATE 10 MG PO TABS: 10.0000 mg | ORAL_TABLET | Freq: Four times a day (QID) | ORAL | 1 refills | Status: AC | PRN

## 2024-07-27 MED ORDER — ONDANSETRON HCL 8 MG PO TABS: 8.0000 mg | ORAL_TABLET | Freq: Three times a day (TID) | ORAL | 1 refills | Status: AC | PRN

## 2024-07-27 MED ORDER — OXALIPLATIN CHEMO INJECTION 100 MG/20ML
85.0000 mg/m2 | Freq: Once | INTRAVENOUS | Status: AC
  Administered 2024-07-27: 200 mg via INTRAVENOUS
  Filled 2024-07-27: qty 40

## 2024-07-27 MED ORDER — SODIUM CHLORIDE 0.9 % IV SOLN
2600.0000 mg/m2 | INTRAVENOUS | Status: DC
  Administered 2024-07-27: 7000 mg via INTRAVENOUS
  Filled 2024-07-27: qty 140

## 2024-07-27 MED ORDER — SODIUM CHLORIDE 0.9 % IV SOLN
50.0000 mg/m2 | Freq: Once | INTRAVENOUS | Status: AC
  Administered 2024-07-27: 122 mg via INTRAVENOUS
  Filled 2024-07-27: qty 12.2

## 2024-07-27 MED ORDER — SODIUM CHLORIDE 0.9 % IV SOLN
1500.0000 mg | Freq: Once | INTRAVENOUS | Status: AC
  Administered 2024-07-27: 1500 mg via INTRAVENOUS
  Filled 2024-07-27: qty 30

## 2024-07-27 MED ORDER — PALONOSETRON HCL INJECTION 0.25 MG/5ML
0.2500 mg | Freq: Once | INTRAVENOUS | Status: AC
  Administered 2024-07-27: 0.25 mg via INTRAVENOUS
  Filled 2024-07-27: qty 5

## 2024-07-27 MED ORDER — LIDOCAINE-PRILOCAINE 2.5-2.5 % EX CREA: TOPICAL_CREAM | CUTANEOUS | 3 refills | Status: DC

## 2024-07-27 MED ORDER — DEXAMETHASONE SODIUM PHOSPHATE 10 MG/ML IJ SOLN
10.0000 mg | Freq: Once | INTRAMUSCULAR | Status: AC
  Administered 2024-07-27: 10 mg via INTRAVENOUS
  Filled 2024-07-27: qty 1

## 2024-07-27 MED ORDER — DEXTROSE 5 % IV SOLN
INTRAVENOUS | Status: DC
Start: 2024-07-27 — End: 2024-07-27

## 2024-07-27 MED ORDER — DEXAMETHASONE 4 MG PO TABS: 8.0000 mg | ORAL_TABLET | Freq: Every day | ORAL | 1 refills | Status: AC

## 2024-07-27 NOTE — Progress Notes (Unsigned)
Chemotherapy and immunotherapy education packet given and discussed with pt and family in detail. Discussed diagnosis and staging, tx regimen, and intent of tx. Reviewed chemotherapy and immunotherapy medications and side effects, as well as pre-medications. Instructed on how to manage side effects at home, and when to call the clinic. Importance of fever/chills discussed with pt and family. Discussed precautions to implement at home after receiving tx, as well as self care strategies. Phone numbers provided for clinic during regular working hours, also how to reach the clinic after hours and on weekends. Pt and family provided the opportunity to ask questions - all questions answered to pt's and family satisfaction.

## 2024-07-27 NOTE — Progress Notes (Signed)
 Patient presents today for chemotherapy infusion.  Patient is in satisfactory condition with no new complaints voiced.  Vital signs are stable.  Labs reviewed.  Creatinine today is 1.82.  Dr. Davonna made aware.  All other labs are within parameters.  We will proceed with treatment per MD orders.    Patient tolerated treatment well with no complaints voiced.  Home infusion pump connected.  Patient left via wheelchair with wife in stable condition.  Vital signs stable at discharge.  Follow up as scheduled.

## 2024-07-27 NOTE — Patient Instructions (Signed)
 [facility] - A DEPT OF [place]  Discharge Instructions: Thank you for choosing [facility] to provide your oncology and hematology care.  If you have a lab appointment with the Cancer Center - please note that after April 8th, 2024, all labs will be drawn in the cancer center.  You do not have to check in or register with the main entrance as you have in the past but will complete your check-in in the cancer center.  Wear comfortable clothing and clothing appropriate for easy access to any Portacath or PICC line.   We strive to give you quality time with your provider. You may need to reschedule your appointment if you arrive late (15 or more minutes).  Arriving late affects you and other patients whose appointments are after yours.  Also, if you miss three or more appointments without notifying the office, you may be dismissed from the clinic at the provider's discretion.      For prescription refill requests, have your pharmacy contact our office and allow 72 hours for refills to be completed.    Today you received the following chemotherapy and/or immunotherapy agents Imfinzi /Docetaxel /Oxaliplatin /Leucovorin /5FU.  Durvalumab  Injection What is this medication? DURVALUMAB  (dur VAL ue mab) treats some types of cancer. It works by helping your immune system slow or stop the spread of cancer cells. It is a monoclonal antibody. This medicine may be used for other purposes; ask your health care provider or pharmacist if you have questions. COMMON BRAND NAME(S): IMFINZI  What should I tell my care team before I take this medication? They need to know if you have any of these conditions: Allogeneic stem cell transplant (uses someone else's stem cells) Autoimmune diseases, such as Crohn disease, ulcerative colitis, lupus History of chest radiation Nervous system problems, such as Guillain-Barre syndrome, myasthenia gravis Organ transplant An unusual  or allergic reaction to durvalumab , other medications, foods, dyes, or preservatives Pregnant or trying to get pregnant Breast-feeding How should I use this medication? This medication is infused into a vein. It is given by your care team in a hospital or clinic setting. A special MedGuide will be given to you before each treatment. Be sure to read this information carefully each time. Talk to your care team about the use of this medication in children. Special care may be needed. Overdosage: If you think you have taken too much of this medicine contact a poison control center or emergency room at once. NOTE: This medicine is only for you. Do not share this medicine with others. What if I miss a dose? Keep appointments for follow-up doses. It is important not to miss your dose. Call your care team if you are unable to keep an appointment. What may interact with this medication? Interactions have not been studied. This list may not describe all possible interactions. Give your health care provider a list of all the medicines, herbs, non-prescription drugs, or dietary supplements you use. Also tell them if you smoke, drink alcohol, or use illegal drugs. Some items may interact with your medicine. What should I watch for while using this medication? Your condition will be monitored carefully while you are receiving this medication. You may need blood work while taking this medication. This medication may cause serious skin reactions. They can happen weeks to months after starting the medication. Contact your care team right away if you notice fevers or flu-like symptoms with a rash. The rash may be red or  purple and then turn into blisters or peeling of the skin. You may also notice a red rash with swelling of the face, lips, or lymph nodes in your neck or under your arms. Tell your care team right away if you have any change in your eyesight. Talk to your care team if you may be pregnant. Serious  birth defects can occur if you take this medication during pregnancy and for 3 months after the last dose. You will need a negative pregnancy test before starting this medication. Contraception is recommended while taking this medication and for 3 months after the last dose. Your care team can help you find the option that works for you. Do not breastfeed while taking this medication and for 3 months after the last dose. What side effects may I notice from receiving this medication? Side effects that you should report to your care team as soon as possible: Allergic reactions--skin rash, itching, hives, swelling of the face, lips, tongue, or throat Dry cough, shortness of breath or trouble breathing Eye pain, redness, irritation, or discharge with blurry or decreased vision Heart muscle inflammation--unusual weakness or fatigue, shortness of breath, chest pain, fast or irregular heartbeat, dizziness, swelling of the ankles, feet, or hands Hormone gland problems--headache, sensitivity to light, unusual weakness or fatigue, dizziness, fast or irregular heartbeat, increased sensitivity to cold or heat, excessive sweating, constipation, hair loss, increased thirst or amount of urine, tremors or shaking, irritability Infusion reactions--chest pain, shortness of breath or trouble breathing, feeling faint or lightheaded Kidney injury (glomerulonephritis)--decrease in the amount of urine, red or dark Erasmus Bistline urine, foamy or bubbly urine, swelling of the ankles, hands, or feet Liver injury--right upper belly pain, loss of appetite, nausea, light-colored stool, dark yellow or Mahlet Jergens urine, yellowing skin or eyes, unusual weakness or fatigue Pain, tingling, or numbness in the hands or feet, muscle weakness, change in vision, confusion or trouble speaking, loss of balance or coordination, trouble walking, seizures Rash, fever, and swollen lymph nodes Redness, blistering, peeling, or loosening of the skin, including  inside the mouth Sudden or severe stomach pain, bloody diarrhea, fever, nausea, vomiting Side effects that usually do not require medical attention (report these to your care team if they continue or are bothersome): Bone, joint, or muscle pain Diarrhea Fatigue Loss of appetite Nausea Skin rash This list may not describe all possible side effects. Call your doctor for medical advice about side effects. You may report side effects to FDA at 1-800-FDA-1088. Where should I keep my medication? This medication is given in a hospital or clinic. It will not be stored at home. NOTE: This sheet is a summary. It may not cover all possible information. If you have questions about this medicine, talk to your doctor, pharmacist, or health care provider.  2024 Elsevier/Gold Standard (2022-04-01 00:00:00)   Docetaxel  Injection What is this medication? DOCETAXEL  (doe se TAX el) treats some types of cancer. It works by slowing down the growth of cancer cells. This medicine may be used for other purposes; ask your health care provider or pharmacist if you have questions. COMMON BRAND NAME(S): BEIZRAY , Docefrez , Docivyx , Taxotere  What should I tell my care team before I take this medication? They need to know if you have any of these conditions: Kidney disease Liver disease Low white blood cell levels Tingling of the fingers or toes or other nerve disorder An unusual or allergic reaction to docetaxel , polysorbate 80, other medications, foods, dyes, or preservatives Pregnant or trying to get pregnant Breast-feeding  How should I use this medication? This medication is injected into a vein. It is given by your care team in a hospital or clinic setting. Talk to your care team about the use of this medication in children. Special care may be needed. Overdosage: If you think you have taken too much of this medicine contact a poison control center or emergency room at once. NOTE: This medicine is only for  you. Do not share this medicine with others. What if I miss a dose? Keep appointments for follow-up doses. It is important not to miss your dose. Call your care team if you are unable to keep an appointment. What may interact with this medication? Do not take this medication with any of the following: Live virus vaccines This medication may also interact with the following: Certain antibiotics, such as clarithromycin, telithromycin Certain antivirals for HIV or hepatitis Certain medications for fungal infections, such as itraconazole, ketoconazole, voriconazole Grapefruit juice Nefazodone Supplements, such as St. John's wort This list may not describe all possible interactions. Give your health care provider a list of all the medicines, herbs, non-prescription drugs, or dietary supplements you use. Also tell them if you smoke, drink alcohol, or use illegal drugs. Some items may interact with your medicine. What should I watch for while using this medication? This medication may make you feel generally unwell. This is not uncommon as chemotherapy can affect healthy cells as well as cancer cells. Report any side effects. Continue your course of treatment even though you feel ill unless your care team tells you to stop. You may need blood work done while you are taking this medication. This medication can cause serious side effects and infusion reactions. To reduce the risk, your care team may give you other medications to take before receiving this one. Be sure to follow the directions from your care team. This medication may increase your risk of getting an infection. Call your care team for advice if you get a fever, chills, sore throat, or other symptoms of a cold or flu. Do not treat yourself. Try to avoid being around people who are sick. Avoid taking medications that contain aspirin , acetaminophen , ibuprofen , naproxen , or ketoprofen unless instructed by your care team. These medications may  hide a fever. Be careful brushing or flossing your teeth or using a toothpick because you may get an infection or bleed more easily. If you have any dental work done, tell your dentist you are receiving this medication. Some products may contain alcohol. Ask your care team if this medication contains alcohol. Be sure to tell all care teams you are taking this medicine. Certain medications, like metronidazole and disulfiram, can cause an unpleasant reaction when taken with alcohol. The reaction includes flushing, headache, nausea, vomiting, sweating, and increased thirst. The reaction can last from 30 minutes to several hours. This medication may affect your coordination, reaction time, or judgement. Do not drive or operate machinery until you know how this medication affects you. Sit up or stand slowly to reduce the risk of dizzy or fainting spells. Drinking alcohol with this medication can increase the risk of these side effects. Talk to your care team about your risk of cancer. You may be more at risk for certain types of cancer if you take this medication. Talk to your care team if you wish to become pregnant or think you might be pregnant. This medication can cause serious birth defects if taken during pregnancy or if you get pregnant within 2 months  after stopping therapy. A negative pregnancy test is required before starting this medication. A reliable form of contraception is recommended while taking this medication and for 2 months after stopping it. Talk to your care team about reliable forms of contraception. Do not breast-feed while taking this medication and for 1 week after stopping therapy. Use a condom during sex and for 4 months after stopping therapy. Tell your care team right away if you think your partner might be pregnant. This medication can cause serious birth defects. This medication may cause infertility. Talk to your care team if you are concerned about your fertility. What side  effects may I notice from receiving this medication? Side effects that you should report to your care team as soon as possible: Allergic reactions--skin rash, itching, hives, swelling of the face, lips, tongue, or throat Change in vision such as blurry vision, seeing halos around lights, vision loss Infection--fever, chills, cough, or sore throat Infusion reactions--chest pain, shortness of breath or trouble breathing, feeling faint or lightheaded Low red blood cell level--unusual weakness or fatigue, dizziness, headache, trouble breathing Pain, tingling, or numbness in the hands or feet Painful swelling, warmth, or redness of the skin, blisters or sores at the infusion site Redness, blistering, peeling, or loosening of the skin, including inside the mouth Sudden or severe stomach pain, bloody diarrhea, fever, nausea, vomiting Swelling of the ankles, hands, or feet Tumor lysis syndrome (TLS)--nausea, vomiting, diarrhea, decrease in the amount of urine, dark urine, unusual weakness or fatigue, confusion, muscle pain or cramps, fast or irregular heartbeat, joint pain Unusual bruising or bleeding Side effects that usually do not require medical attention (report to your care team if they continue or are bothersome): Change in nail shape, thickness, or color Change in taste Hair loss Increased tears This list may not describe all possible side effects. Call your doctor for medical advice about side effects. You may report side effects to FDA at 1-800-FDA-1088. Where should I keep my medication? This medication is given in a hospital or clinic. It will not be stored at home. NOTE: This sheet is a summary. It may not cover all possible information. If you have questions about this medicine, talk to your doctor, pharmacist, or health care provider.  2024 Elsevier/Gold Standard (2022-01-23 00:00:00)   Oxaliplatin  Injection What is this medication? OXALIPLATIN  (ox AL i PLA tin) treats colorectal  cancer. It works by slowing down the growth of cancer cells. This medicine may be used for other purposes; ask your health care provider or pharmacist if you have questions. COMMON BRAND NAME(S): Eloxatin  What should I tell my care team before I take this medication? They need to know if you have any of these conditions: Heart disease History of irregular heartbeat or rhythm Liver disease Low blood cell levels (white cells, red cells, and platelets) Lung or breathing disease, such as asthma Take medications that treat or prevent blood clots Tingling of the fingers, toes, or other nerve disorder An unusual or allergic reaction to oxaliplatin , other medications, foods, dyes, or preservatives If you or your partner are pregnant or trying to get pregnant Breast-feeding How should I use this medication? This medication is injected into a vein. It is given by your care team in a hospital or clinic setting. Talk to your care team about the use of this medication in children. Special care may be needed. Overdosage: If you think you have taken too much of this medicine contact a poison control center or emergency room at  once. NOTE: This medicine is only for you. Do not share this medicine with others. What if I miss a dose? Keep appointments for follow-up doses. It is important not to miss a dose. Call your care team if you are unable to keep an appointment. What may interact with this medication? Do not take this medication with any of the following: Cisapride Dronedarone Pimozide Thioridazine This medication may also interact with the following: Aspirin  and aspirin -like medications Certain medications that treat or prevent blood clots, such as warfarin, apixaban, dabigatran, and rivaroxaban Cisplatin Cyclosporine Diuretics Medications for infection, such as acyclovir, adefovir, amphotericin B, bacitracin , cidofovir, foscarnet, ganciclovir, gentamicin, pentamidine, vancomycin  NSAIDs,  medications for pain and inflammation, such as ibuprofen  or naproxen  Other medications that cause heart rhythm changes Pamidronate Zoledronic acid This list may not describe all possible interactions. Give your health care provider a list of all the medicines, herbs, non-prescription drugs, or dietary supplements you use. Also tell them if you smoke, drink alcohol, or use illegal drugs. Some items may interact with your medicine. What should I watch for while using this medication? Your condition will be monitored carefully while you are receiving this medication. You may need blood work while taking this medication. This medication may make you feel generally unwell. This is not uncommon as chemotherapy can affect healthy cells as well as cancer cells. Report any side effects. Continue your course of treatment even though you feel ill unless your care team tells you to stop. This medication may increase your risk of getting an infection. Call your care team for advice if you get a fever, chills, sore throat, or other symptoms of a cold or flu. Do not treat yourself. Try to avoid being around people who are sick. Avoid taking medications that contain aspirin , acetaminophen , ibuprofen , naproxen , or ketoprofen unless instructed by your care team. These medications may hide a fever. Be careful brushing or flossing your teeth or using a toothpick because you may get an infection or bleed more easily. If you have any dental work done, tell your dentist you are receiving this medication. This medication can make you more sensitive to cold. Do not drink cold drinks or use ice. Cover exposed skin before coming in contact with cold temperatures or cold objects. When out in cold weather wear warm clothing and cover your mouth and nose to warm the air that goes into your lungs. Tell your care team if you get sensitive to the cold. Talk to your care team if you or your partner are pregnant or think either of you  might be pregnant. This medication can cause serious birth defects if taken during pregnancy and for 9 months after the last dose. A negative pregnancy test is required before starting this medication. A reliable form of contraception is recommended while taking this medication and for 9 months after the last dose. Talk to your care team about effective forms of contraception. Do not father a child while taking this medication and for 6 months after the last dose. Use a condom while having sex during this time period. Do not breastfeed while taking this medication and for 3 months after the last dose. This medication may cause infertility. Talk to your care team if you are concerned about your fertility. What side effects may I notice from receiving this medication? Side effects that you should report to your care team as soon as possible: Allergic reactions--skin rash, itching, hives, swelling of the face, lips, tongue, or throat Bleeding--bloody or  black, tar-like stools, vomiting blood or Letcher Schweikert material that looks like coffee grounds, red or dark Brondon Wann urine, small red or purple spots on skin, unusual bruising or bleeding Dry cough, shortness of breath or trouble breathing Heart rhythm changes--fast or irregular heartbeat, dizziness, feeling faint or lightheaded, chest pain, trouble breathing Infection--fever, chills, cough, sore throat, wounds that don't heal, pain or trouble when passing urine, general feeling of discomfort or being unwell Liver injury--right upper belly pain, loss of appetite, nausea, light-colored stool, dark yellow or Soma Bachand urine, yellowing skin or eyes, unusual weakness or fatigue Low red blood cell level--unusual weakness or fatigue, dizziness, headache, trouble breathing Muscle injury--unusual weakness or fatigue, muscle pain, dark yellow or Jaileigh Weimer urine, decrease in amount of urine Pain, tingling, or numbness in the hands or feet Sudden and severe headache, confusion, change  in vision, seizures, which may be signs of posterior reversible encephalopathy syndrome (PRES) Unusual bruising or bleeding Side effects that usually do not require medical attention (report to your care team if they continue or are bothersome): Diarrhea Nausea Pain, redness, or swelling with sores inside the mouth or throat Unusual weakness or fatigue Vomiting This list may not describe all possible side effects. Call your doctor for medical advice about side effects. You may report side effects to FDA at 1-800-FDA-1088. Where should I keep my medication? This medication is given in a hospital or clinic. It will not be stored at home. NOTE: This sheet is a summary. It may not cover all possible information. If you have questions about this medicine, talk to your doctor, pharmacist, or health care provider.  2024 Elsevier/Gold Standard (2023-10-30 00:00:00)   Leucovorin  Injection What is this medication? LEUCOVORIN  (loo koe VOR in) prevents side effects from certain medications, such as methotrexate. It works by increasing folate levels. This helps protect healthy cells in your body. It may also be used to treat anemia caused by low levels of folate. It can also be used with fluorouracil , a type of chemotherapy, to treat colorectal cancer. It works by increasing the effects of fluorouracil  in the body. This medicine may be used for other purposes; ask your health care provider or pharmacist if you have questions. What should I tell my care team before I take this medication? They need to know if you have any of these conditions: Anemia from low levels of vitamin B12 in the blood An unusual or allergic reaction to leucovorin , folic acid , other medications, foods, dyes, or preservatives Pregnant or trying to get pregnant Breastfeeding How should I use this medication? This medication is injected into a vein or a muscle. It is given by your care team in a hospital or clinic setting. Talk to  your care team about the use of this medication in children. Special care may be needed. Overdosage: If you think you have taken too much of this medicine contact a poison control center or emergency room at once. NOTE: This medicine is only for you. Do not share this medicine with others. What if I miss a dose? Keep appointments for follow-up doses. It is important not to miss your dose. Call your care team if you are unable to keep an appointment. What may interact with this medication? Capecitabine Fluorouracil  Phenobarbital Phenytoin Primidone Trimethoprim;sulfamethoxazole This list may not describe all possible interactions. Give your health care provider a list of all the medicines, herbs, non-prescription drugs, or dietary supplements you use. Also tell them if you smoke, drink alcohol, or use illegal drugs. Some items  may interact with your medicine. What should I watch for while using this medication? Your condition will be monitored carefully while you are receiving this medication. This medication may increase the side effects of 5-fluorouracil . Tell your care team if you have diarrhea or mouth sores that do not get better or that get worse. What side effects may I notice from receiving this medication? Side effects that you should report to your care team as soon as possible: Allergic reactions--skin rash, itching, hives, swelling of the face, lips, tongue, or throat This list may not describe all possible side effects. Call your doctor for medical advice about side effects. You may report side effects to FDA at 1-800-FDA-1088. Where should I keep my medication? This medication is given in a hospital or clinic. It will not be stored at home. NOTE: This sheet is a summary. It may not cover all possible information. If you have questions about this medicine, talk to your doctor, pharmacist, or health care provider.  2024 Elsevier/Gold Standard (2022-04-22 00:00:00)   Fluorouracil   Injection What is this medication? FLUOROURACIL  (flure oh YOOR a sil) treats some types of cancer. It works by slowing down the growth of cancer cells. This medicine may be used for other purposes; ask your health care provider or pharmacist if you have questions. COMMON BRAND NAME(S): Adrucil  What should I tell my care team before I take this medication? They need to know if you have any of these conditions: Blood disorders Dihydropyrimidine dehydrogenase (DPD) deficiency Infection, such as chickenpox, cold sores, herpes Kidney disease Liver disease Poor nutrition Recent or ongoing radiation therapy An unusual or allergic reaction to fluorouracil , other medications, foods, dyes, or preservatives If you or your partner are pregnant or trying to get pregnant Breast-feeding How should I use this medication? This medication is injected into a vein. It is administered by your care team in a hospital or clinic setting. Talk to your care team about the use of this medication in children. Special care may be needed. Overdosage: If you think you have taken too much of this medicine contact a poison control center or emergency room at once. NOTE: This medicine is only for you. Do not share this medicine with others. What if I miss a dose? Keep appointments for follow-up doses. It is important not to miss your dose. Call your care team if you are unable to keep an appointment. What may interact with this medication? Do not take this medication with any of the following: Live virus vaccines This medication may also interact with the following: Medications that treat or prevent blood clots, such as warfarin, enoxaparin , dalteparin This list may not describe all possible interactions. Give your health care provider a list of all the medicines, herbs, non-prescription drugs, or dietary supplements you use. Also tell them if you smoke, drink alcohol, or use illegal drugs. Some items may interact with  your medicine. What should I watch for while using this medication? Your condition will be monitored carefully while you are receiving this medication. This medication may make you feel generally unwell. This is not uncommon as chemotherapy can affect healthy cells as well as cancer cells. Report any side effects. Continue your course of treatment even though you feel ill unless your care team tells you to stop. In some cases, you may be given additional medications to help with side effects. Follow all directions for their use. This medication may increase your risk of getting an infection. Call your care team for  advice if you get a fever, chills, sore throat, or other symptoms of a cold or flu. Do not treat yourself. Try to avoid being around people who are sick. This medication may increase your risk to bruise or bleed. Call your care team if you notice any unusual bleeding. Be careful brushing or flossing your teeth or using a toothpick because you may get an infection or bleed more easily. If you have any dental work done, tell your dentist you are receiving this medication. Avoid taking medications that contain aspirin , acetaminophen , ibuprofen , naproxen , or ketoprofen unless instructed by your care team. These medications may hide a fever. Do not treat diarrhea with over the counter products. Contact your care team if you have diarrhea that lasts more than 2 days or if it is severe and watery. This medication can make you more sensitive to the sun. Keep out of the sun. If you cannot avoid being in the sun, wear protective clothing and sunscreen. Do not use sun lamps, tanning beds, or tanning booths. Talk to your care team if you or your partner wish to become pregnant or think you might be pregnant. This medication can cause serious birth defects if taken during pregnancy and for 3 months after the last dose. A reliable form of contraception is recommended while taking this medication and for 3  months after the last dose. Talk to your care team about effective forms of contraception. Do not father a child while taking this medication and for 3 months after the last dose. Use a condom while having sex during this time period. Do not breastfeed while taking this medication. This medication may cause infertility. Talk to your care team if you are concerned about your fertility. What side effects may I notice from receiving this medication? Side effects that you should report to your care team as soon as possible: Allergic reactions--skin rash, itching, hives, swelling of the face, lips, tongue, or throat Heart attack--pain or tightness in the chest, shoulders, arms, or jaw, nausea, shortness of breath, cold or clammy skin, feeling faint or lightheaded Heart failure--shortness of breath, swelling of the ankles, feet, or hands, sudden weight gain, unusual weakness or fatigue Heart rhythm changes--fast or irregular heartbeat, dizziness, feeling faint or lightheaded, chest pain, trouble breathing High ammonia level--unusual weakness or fatigue, confusion, loss of appetite, nausea, vomiting, seizures Infection--fever, chills, cough, sore throat, wounds that don't heal, pain or trouble when passing urine, general feeling of discomfort or being unwell Low red blood cell level--unusual weakness or fatigue, dizziness, headache, trouble breathing Pain, tingling, or numbness in the hands or feet, muscle weakness, change in vision, confusion or trouble speaking, loss of balance or coordination, trouble walking, seizures Redness, swelling, and blistering of the skin over hands and feet Severe or prolonged diarrhea Unusual bruising or bleeding Side effects that usually do not require medical attention (report to your care team if they continue or are bothersome): Dry skin Headache Increased tears Nausea Pain, redness, or swelling with sores inside the mouth or throat Sensitivity to  light Vomiting This list may not describe all possible side effects. Call your doctor for medical advice about side effects. You may report side effects to FDA at 1-800-FDA-1088. Where should I keep my medication? This medication is given in a hospital or clinic. It will not be stored at home. NOTE: This sheet is a summary. It may not cover all possible information. If you have questions about this medicine, talk to your doctor, pharmacist, or health  care provider.  2024 Elsevier/Gold Standard (2022-03-25 00:00:00)       To help prevent nausea and vomiting after your treatment, we encourage you to take your nausea medication as directed.  BELOW ARE SYMPTOMS THAT SHOULD BE REPORTED IMMEDIATELY: *FEVER GREATER THAN 100.4 F (38 C) OR HIGHER *CHILLS OR SWEATING *NAUSEA AND VOMITING THAT IS NOT CONTROLLED WITH YOUR NAUSEA MEDICATION *UNUSUAL SHORTNESS OF BREATH *UNUSUAL BRUISING OR BLEEDING *URINARY PROBLEMS (pain or burning when urinating, or frequent urination) *BOWEL PROBLEMS (unusual diarrhea, constipation, pain near the anus) TENDERNESS IN MOUTH AND THROAT WITH OR WITHOUT PRESENCE OF ULCERS (sore throat, sores in mouth, or a toothache) UNUSUAL RASH, SWELLING OR PAIN  UNUSUAL VAGINAL DISCHARGE OR ITCHING   Items with * indicate a potential emergency and should be followed up as soon as possible or go to the Emergency Department if any problems should occur.  Please show the CHEMOTHERAPY ALERT CARD or IMMUNOTHERAPY ALERT CARD at check-in to the Emergency Department and triage nurse.  Should you have questions after your visit or need to cancel or reschedule your appointment, please contact [facility] [place] - A DEPT OF [name] [phone]  and follow the prompts.  Office hours are 8:00 a.m. to 4:30 p.m. Monday - Friday. Please note that voicemails left after 4:00 p.m. may not be returned until the following business day.  We are closed weekends and major  holidays. You have access to a nurse at all times for urgent questions. Please call the main number to the clinic [phone] and follow the prompts.  For any non-urgent questions, you may also contact your provider using MyChart. We now offer e-Visits for anyone 75 and older to request care online for non-urgent symptoms. For details visit [email or web address].   Also download the MyChart app! Go to the app store, search MyChart, open the app, select [place], and log in with your MyChart username and password.

## 2024-07-27 NOTE — Progress Notes (Signed)
 Patients port flushed without difficulty.  Good blood return noted with no bruising or swelling noted at site. Patient remains accessed for treatment.

## 2024-07-28 ENCOUNTER — Inpatient Hospital Stay

## 2024-07-28 ENCOUNTER — Encounter: Payer: Self-pay | Admitting: Oncology

## 2024-07-28 ENCOUNTER — Ambulatory Visit (HOSPITAL_COMMUNITY)
Admission: RE | Admit: 2024-07-28 | Discharge: 2024-07-28 | Disposition: A | Discharge status: Home / Self Care | Source: Ambulatory Visit | Attending: Nephrology | Admitting: Nephrology

## 2024-07-28 VITALS — BP 133/89 | HR 76 | Temp 97.3°F | Resp 18

## 2024-07-28 DIAGNOSIS — C169 Malignant neoplasm of stomach, unspecified: Secondary | ICD-10-CM

## 2024-07-28 DIAGNOSIS — N281 Cyst of kidney, acquired: Secondary | ICD-10-CM | POA: Diagnosis not present

## 2024-07-28 DIAGNOSIS — Z79620 Long term (current) use of immunosuppressive biologic: Secondary | ICD-10-CM | POA: Diagnosis not present

## 2024-07-28 DIAGNOSIS — N183 Chronic kidney disease, stage 3 unspecified: Secondary | ICD-10-CM | POA: Insufficient documentation

## 2024-07-28 DIAGNOSIS — N189 Chronic kidney disease, unspecified: Secondary | ICD-10-CM | POA: Diagnosis not present

## 2024-07-28 DIAGNOSIS — E1142 Type 2 diabetes mellitus with diabetic polyneuropathy: Secondary | ICD-10-CM | POA: Diagnosis not present

## 2024-07-28 DIAGNOSIS — K5900 Constipation, unspecified: Secondary | ICD-10-CM | POA: Diagnosis not present

## 2024-07-28 DIAGNOSIS — E1122 Type 2 diabetes mellitus with diabetic chronic kidney disease: Secondary | ICD-10-CM | POA: Diagnosis not present

## 2024-07-28 DIAGNOSIS — Z8551 Personal history of malignant neoplasm of bladder: Secondary | ICD-10-CM | POA: Diagnosis not present

## 2024-07-28 DIAGNOSIS — Z79630 Long term (current) use of alkylating agent: Secondary | ICD-10-CM | POA: Diagnosis not present

## 2024-07-28 DIAGNOSIS — G620 Drug-induced polyneuropathy: Secondary | ICD-10-CM | POA: Diagnosis not present

## 2024-07-28 DIAGNOSIS — F17210 Nicotine dependence, cigarettes, uncomplicated: Secondary | ICD-10-CM | POA: Diagnosis not present

## 2024-07-28 DIAGNOSIS — R220 Localized swelling, mass and lump, head: Secondary | ICD-10-CM | POA: Diagnosis not present

## 2024-07-28 DIAGNOSIS — D500 Iron deficiency anemia secondary to blood loss (chronic): Secondary | ICD-10-CM | POA: Diagnosis not present

## 2024-07-28 DIAGNOSIS — Z79631 Long term (current) use of antimetabolite agent: Secondary | ICD-10-CM | POA: Diagnosis not present

## 2024-07-28 DIAGNOSIS — N1832 Chronic kidney disease, stage 3b: Secondary | ICD-10-CM | POA: Diagnosis not present

## 2024-07-28 DIAGNOSIS — Z79633 Long term (current) use of mitotic inhibitor: Secondary | ICD-10-CM | POA: Diagnosis not present

## 2024-07-28 DIAGNOSIS — T451X5A Adverse effect of antineoplastic and immunosuppressive drugs, initial encounter: Secondary | ICD-10-CM | POA: Diagnosis not present

## 2024-07-28 LAB — T4: T4, Total: 6.9 ug/dL (ref 4.5–12.0)

## 2024-07-28 MED ORDER — PEGFILGRASTIM-CBQV 6 MG/0.6ML ~~LOC~~ SOSY
6.0000 mg | PREFILLED_SYRINGE | Freq: Once | SUBCUTANEOUS | Status: AC
  Administered 2024-07-28: 6 mg via SUBCUTANEOUS
  Filled 2024-07-28: qty 0.6

## 2024-07-28 NOTE — Progress Notes (Signed)
 Patient arrived for home infusion pump disconnect.  Patient still has 23 mL left on his pump (about 2 hours).  Patient and wife informed that he could disconnect now and receive a partial dose or return in 2 hours.  Patient chooses to disconnect now.  We will schedule appointment time correctly for next treatment.  Dr. Davonna made aware.  Patients port flushed without difficulty.  Good blood return noted with no bruising or swelling noted at site.  Band aid applied.  Home infusion pump disconnected.  VSS with discharge and left in satisfactory condition with no s/s of distress noted.

## 2024-07-28 NOTE — Progress Notes (Signed)
 Patient tolerated Udenyca injection with no complaints voiced.  Site clean and dry with no bruising or swelling noted.  No complaints of pain.  Discharged with vital signs stable and no signs or symptoms of distress noted.

## 2024-07-28 NOTE — Patient Instructions (Signed)
 [facility] [place] - A DEPT OF [facility]  Discharge Instructions: Thank you for choosing [facility] to provide your oncology and hematology care.  If you have a lab appointment with the Cancer Center - please note that after April 8th, 2024, all labs will be drawn in the cancer center.  You do not have to check in or register with the main entrance as you have in the past but will complete your check-in in the cancer center.  Wear comfortable clothing and clothing appropriate for easy access to any Portacath or PICC line.   We strive to give you quality time with your provider. You may need to reschedule your appointment if you arrive late (15 or more minutes).  Arriving late affects you and other patients whose appointments are after yours.  Also, if you miss three or more appointments without notifying the office, you may be dismissed from the clinic at the provider's discretion.      For prescription refill requests, have your pharmacy contact our office and allow 72 hours for refills to be completed.    Today you received the following chemotherapy and/or immunotherapy agents Udenyca.  Pegfilgrastim Injection What is this medication? PEGFILGRASTIM (PEG fil gra stim) lowers the risk of infection in people who are receiving chemotherapy. It works by Systems analyst make more white blood cells, which protects your body from infection. It may also be used to help people who have been exposed to high doses of radiation. This medicine may be used for other purposes; ask your health care provider or pharmacist if you have questions. COMMON BRAND NAME(S): Cherly Hensen, Neulasta, Nyvepria, Stimufend, UDENYCA, UDENYCA ONBODY, Ziextenzo What should I tell my care team before I take this medication? They need to know if you have any of these conditions: Kidney disease Latex allergy Ongoing radiation therapy Sickle cell disease Skin reactions to  acrylic adhesives (On-Body Injector only) An unusual or allergic reaction to pegfilgrastim, filgrastim, other medications, foods, dyes, or preservatives Pregnant or trying to get pregnant Breast-feeding How should I use this medication? This medication is for injection under the skin. If you get this medication at home, you will be taught how to prepare and give the pre-filled syringe or how to use the On-body Injector. Refer to the patient Instructions for Use for detailed instructions. Use exactly as directed. Tell your care team immediately if you suspect that the On-body Injector may not have performed as intended or if you suspect the use of the On-body Injector resulted in a missed or partial dose. It is important that you put your used needles and syringes in a special sharps container. Do not put them in a trash can. If you do not have a sharps container, call your pharmacist or care team to get one. Talk to your care team about the use of this medication in children. While this medication may be prescribed for selected conditions, precautions do apply. Overdosage: If you think you have taken too much of this medicine contact a poison control center or emergency room at once. NOTE: This medicine is only for you. Do not share this medicine with others. What if I miss a dose? It is important not to miss your dose. Call your care team if you miss your dose. If you miss a dose due to an On-body Injector failure or leakage, a new dose should be administered as soon as possible using a single prefilled syringe for  manual use. What may interact with this medication? Interactions have not been studied. This list may not describe all possible interactions. Give your health care provider a list of all the medicines, herbs, non-prescription drugs, or dietary supplements you use. Also tell them if you smoke, drink alcohol, or use illegal drugs. Some items may interact with your medicine. What should I  watch for while using this medication? Your condition will be monitored carefully while you are receiving this medication. You may need blood work done while you are taking this medication. Talk to your care team about your risk of cancer. You may be more at risk for certain types of cancer if you take this medication. If you are going to need a MRI, CT scan, or other procedure, tell your care team that you are using this medication (On-Body Injector only). What side effects may I notice from receiving this medication? Side effects that you should report to your care team as soon as possible: Allergic reactions--skin rash, itching, hives, swelling of the face, lips, tongue, or throat Capillary leak syndrome--stomach or muscle pain, unusual weakness or fatigue, feeling faint or lightheaded, decrease in the amount of urine, swelling of the ankles, hands, or feet, trouble breathing High white blood cell level--fever, fatigue, trouble breathing, night sweats, change in vision, weight loss Inflammation of the aorta--fever, fatigue, back, chest, or stomach pain, severe headache Kidney injury (glomerulonephritis)--decrease in the amount of urine, red or dark Adron Geisel urine, foamy or bubbly urine, swelling of the ankles, hands, or feet Shortness of breath or trouble breathing Spleen injury--pain in upper left stomach or shoulder Unusual bruising or bleeding Side effects that usually do not require medical attention (report to your care team if they continue or are bothersome): Bone pain Pain in the hands or feet This list may not describe all possible side effects. Call your doctor for medical advice about side effects. You may report side effects to FDA at 1-800-FDA-1088. Where should I keep my medication? Keep out of the reach of children. If you are using this medication at home, you will be instructed on how to store it. Throw away any unused medication after the expiration date on the label. NOTE:  This sheet is a summary. It may not cover all possible information. If you have questions about this medicine, talk to your doctor, pharmacist, or health care provider.  2024 Elsevier/Gold Standard (2021-10-18 00:00:00)       To help prevent nausea and vomiting after your treatment, we encourage you to take your nausea medication as directed.  BELOW ARE SYMPTOMS THAT SHOULD BE REPORTED IMMEDIATELY: *FEVER GREATER THAN 100.4 F (38 C) OR HIGHER *CHILLS OR SWEATING *NAUSEA AND VOMITING THAT IS NOT CONTROLLED WITH YOUR NAUSEA MEDICATION *UNUSUAL SHORTNESS OF BREATH *UNUSUAL BRUISING OR BLEEDING *URINARY PROBLEMS (pain or burning when urinating, or frequent urination) *BOWEL PROBLEMS (unusual diarrhea, constipation, pain near the anus) TENDERNESS IN MOUTH AND THROAT WITH OR WITHOUT PRESENCE OF ULCERS (sore throat, sores in mouth, or a toothache) UNUSUAL RASH, SWELLING OR PAIN  UNUSUAL VAGINAL DISCHARGE OR ITCHING   Items with * indicate a potential emergency and should be followed up as soon as possible or go to the Emergency Department if any problems should occur.  Please show the CHEMOTHERAPY ALERT CARD or IMMUNOTHERAPY ALERT CARD at check-in to the Emergency Department and triage nurse.  Should you have questions after your visit or need to cancel or reschedule your appointment, please contact [facility] [place] -  A DEPT OF [facility] [phone]  and follow the prompts.  Office hours are 8:00 a.m. to 4:30 p.m. Monday - Friday. Please note that voicemails left after 4:00 p.m. may not be returned until the following business day.  We are closed weekends and major holidays. You have access to a nurse at all times for urgent questions. Please call the main number to the clinic [phone] and follow the prompts.  For any non-urgent questions, you may also contact your provider using MyChart. We now offer e-Visits for anyone 58 and older to request care online for  non-urgent symptoms. For details visit [email or web address].   Also download the MyChart app! Go to the app store, search "MyChart", open the app, select [employer], and log in with your MyChart username and password.

## 2024-07-29 ENCOUNTER — Encounter

## 2024-07-29 ENCOUNTER — Telehealth (HOSPITAL_BASED_OUTPATIENT_CLINIC_OR_DEPARTMENT_OTHER): Admitting: Primary Care

## 2024-07-29 ENCOUNTER — Telehealth: Payer: Self-pay | Admitting: Primary Care

## 2024-07-29 DIAGNOSIS — J449 Chronic obstructive pulmonary disease, unspecified: Secondary | ICD-10-CM | POA: Diagnosis not present

## 2024-07-29 DIAGNOSIS — G2581 Restless legs syndrome: Secondary | ICD-10-CM | POA: Diagnosis not present

## 2024-07-29 DIAGNOSIS — G4733 Obstructive sleep apnea (adult) (pediatric): Secondary | ICD-10-CM | POA: Diagnosis not present

## 2024-07-29 MED ORDER — UMECLIDINIUM-VILANTEROL 62.5-25 MCG/ACT IN AEPB
1.0000 | INHALATION_SPRAY | Freq: Every day | RESPIRATORY_TRACT | 5 refills | Status: DC
Start: 2024-07-29 — End: 2024-09-09

## 2024-07-29 MED ORDER — ROPINIROLE HCL 2 MG PO TABS: 2.0000 mg | ORAL_TABLET | Freq: Every day | ORAL | 5 refills | Status: DC

## 2024-07-29 NOTE — Telephone Encounter (Signed)
 Patient scheduled.

## 2024-07-29 NOTE — Progress Notes (Signed)
 Virtual Visit via Video Note  I connected with Stetson Pelaez. on 07/29/24 at  1:30 PM EDT by a video enabled telemedicine application and verified that I am speaking with the correct person using two identifiers.  Location: Patient: Home Provider: Office   I discussed the limitations of evaluation and management by telemedicine and the availability of in person appointments. The patient expressed understanding and agreed to proceed.  History of Present Illness: 63 year old male, current smoker. PMH significant for chronic systolic heart failure, CVA, HTN, diabetes, hyperlipidemia, hx renal cell carcinoma.    Previous [facility] pulmonary encounter:  03/03/2023 Patient presents today for sleep consults. Patient had a CVA in February 2024. He has associated right sided weakness and speech impairment. He has symptoms of loud snoring and witnessed apnea. Wife is concerned. He feels his sleep is restless. He will wake himself up at times snoring. Typical bedtime is between 12am-1am. His sleep schedule really can vary. It can take him hours to fall asleep. He wakes up several times a night. At times he starts his day as early as 3am. His father had sleep apnea and wore CPAP.   He is a current smoker. He smokes on average 3 cigarettes a day. He has some baseline shortness of breath and chronic cough. No pulmonary function testing on file. He ambulates with cane. He attends physical therapy three days a week. No oxygen  use.   Sleep questionnaire Symptoms-  loud snoring, witnessed apnea, restless sleep  Prior sleep study- none Bedtime- 12am-1am Time to fall asleep- typically a long time/ varies  Nocturnal awakenings- several times  Out of bed/start of day- some days he is up at 3am Weight changes- no Do you operate heavy machinery- no Do you currently wear CPAP- no Do you current wear oxygen - no Epworth- 18   05/28/2023 Patient presents today to review sleep study results and PFTs.  Patient had  split-night sleep study on 04/08/2023 that showed mild obstructive sleep apnea, AHI 5.5 an hour with SpO2 low 83%.  He had severe periodic limb movement during sleep study with associated arousals. His wife tells me that his legs are constantly moving before going to sleep and leg movement wakes him up at night.   He has daily dyspnea symptoms and chronic cough. Current someday smoker. He is not currently on BD regimen.    07/14/2023 Patient presents today for CPAP compliance.  Split-night sleep study on 04/08/2023 showed mild obstructive sleep apnea, AHI 5.5 an hour with SpO2 low 83%.  Patient had severe periodic limb movement during sleep study with associated arousals.  Mild severity of his OSA CPAP not necessary at this time.  During her last office visit he was started on Requip  for restless leg symptoms. Was also started patient Anoro Ellipta  for moderate COPD.  He is sleeping through the night better since starting Requip . Per is his he wakes up at 2am because he is hungry. ANORO has been helping his breathing. He has a slight cough currently due to head cold. He has cut back on smoking, down to cigarettes a day.  They are having a hard time affording several of his medications due to finance issues. Changing PCP, needs refill of Plavix , Jardiance  and gabapentin  prescriptions.     04/21/2024 Discussed the use of AI scribe software for clinical note transcription with the patient, who gave verbal consent to proceed.  History of Present Illness   Rafi Kenneth. Georgette is a 63 year  old male with COPD, RLS and sleep apnea who presents for a routine follow-up. He is accompanied by his partner.  He has COPD managed with Anora once daily, which he finds effective. He smokes one to two cigarettes daily, influenced by stress levels. He experiences occasional dyspnea, particularly with exertion, but has no cough, sputum production, wheezing, or chest tightness.  He has mild sleep apnea, confirmed by  a sleep study last May showing 5.5 apneic events per hour. He experiences occasional snoring and apneic episodes during sleep, requiring his partner to wake him. He has not used CPAP therapy.  His restless leg syndrome symptoms have worsened, particularly at night, causing significant disruption. He is on gabapentin  300 mg, reduced from 400 mg due to dizziness, forgetfulness, and irritability. Despite this, gabapentin  and Tylenol  are not effectively managing his pain, leading to increased irritability and discomfort.  He has a recent diagnosis of a cancerous ulcer, managed by Dr. Cinderella. No additional pain management has been provided beyond gabapentin  and Tylenol .    1. Mild sleep apnea (Primary) - Ambulatory Referral for DME  2. Periodic limb movement  3. Chronic obstructive pulmonary disease, unspecified COPD type (HCC)  Assessment and Plan    Chronic obstructive pulmonary disease (COPD) COPD is well-managed with Anora once daily. Occasional dyspnea occurs with activity. No active cough. He has cut back on smoking. He can afford Anoro and reports no issues with its use. - Continue Anoro Ellipta  one puff daily.  Restless legs syndrome Restless legs syndrome is worsening, causing significant discomfort and affecting sleep.  Partner reports nocturnal leg movements starting in the afternoon and persisting until he moves. - Increase Requip  to 2 mg if spastic leg movements persist.  Mild sleep apnea Mild sleep apnea with 5.5 apneic events per hour from last year's sleep study. Symptoms include loud snoring and occasional apneic episodes. CPAP therapy is recommended due to worsening snoring and sleep disturbances. He has never used CPAP before. - Order CPAP auto settings 5-15cm h20 - Advised patient aim to wear CPAP nightly 4-6 hours - FU in 8 weeks for compliance check       07/29/2024- Interim hx  Discussed the use of AI scribe software for clinical note transcription with the patient,  who gave verbal consent to proceed.  History of Present Illness [place]. Georgette is a 63 year old male undergoing chemotherapy and immunotherapy who presents with nausea and malaise. He is accompanied by his wife, Diane.  He is experiencing nausea, low-grade fever, and malaise following chemotherapy and immunotherapy treatments that occurred two days ago. This is his first round of chemotherapy, with the second round scheduled for next week. He has not been prescribed any medication for nausea and is currently managing symptoms by resting and maintaining fluid intake.  He recently started using a CPAP machine on July 25th for sleep apnea. He initially used a full face mask but experienced discomfort due to a tooth infection requiring extraction. He switched to a nasal mask but found the pressure setting too high. His current CPAP settings are auto set from five to fifteen, with an average usage pressure of seven. When using the CPAP, his apnea score is well controlled at one event per hour.  He is taking Requip  at a dose of two milligrams at bedtime for restless leg syndrome, which has significantly improved his symptoms.  He is also on Anoro, one puff daily, for COPD and reports no current respiratory symptoms. He has reduced  his smoking habit.  Airview download 04/26/24-07/24/24 Usage days 23/30 days; 5 days >4 hours Average usage days used 3 hours 8 mins Pressure 5-15cm h20 (7cm h20-95%) Airleaks 45L/min AHI 0.9   Observations/Objective:  Laying in bed, able to speak in full sentence but feeling tired due chemotherapy   Pulmonary function testing 03/10/23 >> FVC 3.24 (51%), FEV1 2.10 (44%), ratio 65, DLCO 23.71 (67%) Moderate obstructive airway disease, moderate diffusion defect   Assessment and Plan:  1. OSA (obstructive sleep apnea) (Primary)  2. Chronic obstructive pulmonary disease, unspecified COPD type (HCC)  3. Restless legs syndrome (RLS)  Assessment & Plan Renal  cell carcinoma post chemotherapy and immunotherapy Undergoing chemotherapy and immunotherapy with first treatment two days ago. Experiencing nausea, low-grade fever, and malaise post-treatment.  - Continue prn compazine  for nausea - Ensure adequate fluid intake and rest  Obstructive sleep apnea on CPAP therapy Recently started CPAP therapy on July 25. Experiencing discomfort with full face mask due to a tooth that needs extraction. Nasal mask pressure settings felt too high. Current settings are auto set 5 to 15cm h20, averaging 7. Apnea score improved to 1 event per hour when using CPAP.  - Adjust CPAP pressure to 7cm h20 - Encourage CPAP use for a minimum of 4 to 6 hours during sleep - Follow up in 6 weeks to assess CPAP tolerance and effectiveness - Send message via MyChart if issues with pressure settings arise  Restless legs syndrome Restless legs syndrome well controlled with Requip  2 mg at bedtime. - Continue Requip  2 mg at bedtime - Renew prescription for Requip   Chronic obstructive pulmonary disease (COPD) Currently on Anoro with no reported respiratory symptoms. Patient continues to work on smoking cessation and has cut back - Renew prescription for Anoro, one puff daily  Follow Up Instructions:   6-8 week fu cpap compliance   I discussed the assessment and treatment plan with the patient. The patient was provided an opportunity to ask questions and all were answered. The patient agreed with the plan and demonstrated an understanding of the instructions.   The patient was advised to call back or seek an in-person evaluation if the symptoms worsen or if the condition fails to improve as anticipated.  I provided 22 minutes of non-face-to-face time during this encounter.   Almarie LELON Ferrari, NP

## 2024-07-29 NOTE — Telephone Encounter (Signed)
 Patient needs follow up in 6 weeks with Beth NP to assess CPAP tolerance and effectiveness

## 2024-07-30 ENCOUNTER — Other Ambulatory Visit: Payer: Self-pay

## 2024-07-31 DIAGNOSIS — G4733 Obstructive sleep apnea (adult) (pediatric): Secondary | ICD-10-CM

## 2024-08-01 DIAGNOSIS — G4733 Obstructive sleep apnea (adult) (pediatric): Secondary | ICD-10-CM | POA: Diagnosis not present

## 2024-08-03 ENCOUNTER — Ambulatory Visit: Admitting: Internal Medicine

## 2024-08-03 VITALS — BP 112/75 | HR 87 | Ht 78.0 in | Wt 230.2 lb

## 2024-08-03 DIAGNOSIS — Z7984 Long term (current) use of oral hypoglycemic drugs: Secondary | ICD-10-CM

## 2024-08-03 DIAGNOSIS — Z0000 Encounter for general adult medical examination without abnormal findings: Secondary | ICD-10-CM | POA: Insufficient documentation

## 2024-08-03 DIAGNOSIS — Z0001 Encounter for general adult medical examination with abnormal findings: Secondary | ICD-10-CM

## 2024-08-03 DIAGNOSIS — C169 Malignant neoplasm of stomach, unspecified: Secondary | ICD-10-CM | POA: Diagnosis not present

## 2024-08-03 DIAGNOSIS — G6289 Other specified polyneuropathies: Secondary | ICD-10-CM | POA: Diagnosis not present

## 2024-08-03 DIAGNOSIS — E1159 Type 2 diabetes mellitus with other circulatory complications: Secondary | ICD-10-CM

## 2024-08-03 DIAGNOSIS — I10 Essential (primary) hypertension: Secondary | ICD-10-CM | POA: Diagnosis not present

## 2024-08-03 DIAGNOSIS — E1169 Type 2 diabetes mellitus with other specified complication: Secondary | ICD-10-CM

## 2024-08-03 DIAGNOSIS — Z23 Encounter for immunization: Secondary | ICD-10-CM

## 2024-08-03 MED ORDER — GABAPENTIN 300 MG PO CAPS: 300.0000 mg | ORAL_CAPSULE | Freq: Every day | ORAL | 1 refills | Status: AC

## 2024-08-03 MED ORDER — JARDIANCE 10 MG PO TABS: 10.0000 mg | ORAL_TABLET | Freq: Every day | ORAL | 5 refills | Status: AC

## 2024-08-03 NOTE — Telephone Encounter (Signed)
**Note De-identified  [place] Obfuscation** Please advise 

## 2024-08-03 NOTE — Assessment & Plan Note (Signed)
 BP Readings from Last 1 Encounters:  08/03/24 112/75   Well-controlled with amlodipine  2.5 mg QD Was on ramipril  10 mg once daily, discontinued due to dizziness Counseled for compliance with the medications Advised DASH diet and moderate exercise/walking as tolerated

## 2024-08-03 NOTE — Progress Notes (Signed)
 Subjective:    Raymond Barrera. is a 63 y.o. male who presents for a Welcome to Medicare exam.         Objective:    Today's Vitals   08/03/24 1059 08/03/24 1101  BP: 112/75   Pulse: 87   SpO2: 97%   Weight: 230 lb 3.2 oz (104.4 kg)   Height: 6' 6 (1.981 m)   PainSc: 0-No pain 0-No pain   Body mass index is 26.6 kg/m.  Medications Outpatient Encounter Medications as of 08/03/2024  Medication Sig   Accu-Chek Softclix Lancets lancets SMARTSIG:Topical 2-3 Times Daily   acetaminophen  (TYLENOL ) 325 MG tablet Take 2 tablets (650 mg total) by mouth every 6 (six) hours as needed for mild pain (pain score 1-3) (or Fever >/= 101).   albuterol  (VENTOLIN  HFA) 108 (90 Base) MCG/ACT inhaler Inhale 2 puffs into the lungs every 6 (six) hours as needed for wheezing or shortness of breath (cough).   amLODipine  (NORVASC ) 2.5 MG tablet Take 1 tablet (2.5 mg total) by mouth daily.   aspirin  EC 81 MG tablet Take 1 tablet (81 mg total) by mouth daily with breakfast. Swallow whole.   atorvastatin  (LIPITOR) 40 MG tablet Take 1 tablet (40 mg total) by mouth daily.   Camphor-Menthol -Methyl Sal (SALONPAS EX) Apply topically. As needed   cyanocobalamin  (VITAMIN B12) 1000 MCG tablet Take 1,000 mcg by mouth daily.   dexamethasone  (DECADRON ) 4 MG tablet Take 2 tablets (8 mg total) by mouth daily. Start the day after chemotherapy for 2 days. Take with food.   dextrose  5 % SOLN 1,000 mL with fluorouracil  5 GM/100ML SOLN Inject into the vein every 14 (fourteen) days.   DOCETAXEL  IV Inject into the vein every 14 (fourteen) days.   DULoxetine  (CYMBALTA ) 30 MG capsule Take 1 capsule (30 mg total) by mouth 2 (two) times daily.   Durvalumab  (IMFINZI  IV) Inject into the vein every 28 (twenty-eight) days.   ferrous sulfate  325 (65 FE) MG EC tablet Take 1 tablet (325 mg total) by mouth every Tuesday, Thursday, Saturday, and Sunday.   gabapentin  (NEURONTIN ) 300 MG capsule Take 1 capsule (300 mg total) by mouth at  bedtime.   hydrOXYzine  (ATARAX ) 25 MG tablet Take 1 tablet (25 mg total) by mouth 3 (three) times daily as needed.   JARDIANCE  10 MG TABS tablet Take 1 tablet (10 mg total) by mouth daily.   LEUCOVORIN  CALCIUM  IV Inject into the vein every 14 (fourteen) days.   lidocaine -prilocaine  (EMLA ) cream Apply to affected area once   ondansetron  (ZOFRAN ) 8 MG tablet Take 1 tablet (8 mg total) by mouth every 8 (eight) hours as needed for nausea or vomiting. Start on the third day after chemotherapy.   OXALIPLATIN  IV Inject into the vein every 14 (fourteen) days.   pantoprazole  (PROTONIX ) 40 MG tablet TAKE 1 TABLET(40 MG) BY MOUTH TWICE DAILY   polyethylene glycol powder (GLYCOLAX /MIRALAX ) 17 GM/SCOOP powder Take 8.5 g by mouth daily.   prochlorperazine  (COMPAZINE ) 10 MG tablet Take 1 tablet (10 mg total) by mouth every 6 (six) hours as needed for nausea or vomiting.   rOPINIRole  (REQUIP ) 2 MG tablet Take 1 tablet (2 mg total) by mouth at bedtime.   umeclidinium-vilanterol (ANORO ELLIPTA ) 62.5-25 MCG/ACT AEPB Inhale 1 puff into the lungs daily.   [DISCONTINUED] gabapentin  (NEURONTIN ) 300 MG capsule Take 1 capsule (300 mg total) by mouth at bedtime.   [DISCONTINUED] JARDIANCE  10 MG TABS tablet Take 1 tablet (10 mg total) by mouth  daily.   No facility-administered encounter medications on file as of 08/03/2024.     History: Past Medical History:  Diagnosis Date   Ambulates with cane    straight - uses occasionally   Anxiety    due to the stroke   Arthritis    Back pain    hx of buldging disc   Complication of anesthesia    took a while for him to wake up after previous anesthesia   CVA (cerebral vascular accident) (HCC) 10/07/2020   Diabetes mellitus without complication (HCC)    Family history of adverse reaction to anesthesia    sometimes mom has a hard time waking up   High cholesterol    takes Zocor  daily   Hypertension    takes Benazepril  and HCTZ  daily   Joint pain    Joint swelling     Memory impairment    occassional - from stroke   Myocardial infarction (HCC) 1987   Pneumonia    hx of-80's   Shortness of breath dyspnea    do to pain   Sleep apnea    never had a sleep study,but states Dr. Rennis says he has it   Slurred speech    Stroke (HCC) 08/2013   7 mini-strokes, last stroke 2017   TIA (transient ischemic attack) 2014   x 7    Urinary frequency    Past Surgical History:  Procedure Laterality Date   ABDOMINAL EXPOSURE N/A 06/16/2018   Procedure: ABDOMINAL EXPOSURE;  Surgeon: Oris Krystal FALCON, MD;  Location: [facility] OR;  Service: Vascular;  Laterality: N/A;   ANKLE SURGERY  2008   left ankle-otif-[facility]   ANTERIOR LUMBAR FUSION Bilateral 06/16/2018   Procedure: LUMBAR 4-5 LUMBAR 5-SACRUM 1 ANTERIOR LUMBAR INTERBODY FUSION WITH INSTRUMENTATION AND ALLOGRAFT;  Surgeon: Beuford Anes, MD;  Location: [facility] OR;  Service: Orthopedics;  Laterality: Bilateral;   BACK SURGERY     COLONOSCOPY N/A 03/01/2024   Procedure: COLONOSCOPY;  Surgeon: Eartha Angelia Sieving, MD;  Location: [facility];  Service: Gastroenterology;  Laterality: N/A;   ENTEROSCOPY N/A 04/19/2024   Procedure: ENTEROSCOPY;  Surgeon: Cinderella Deatrice FALCON, MD;  Location: [facility];  Service: Endoscopy;  Laterality: N/A;  9:0AM;ASA 3   ESOPHAGOGASTRODUODENOSCOPY N/A 03/01/2024   Procedure: EGD (ESOPHAGOGASTRODUODENOSCOPY);  Surgeon: Eartha Angelia, Sieving, MD;  Location: [facility];  Service: Gastroenterology;  Laterality: N/A;   ESOPHAGOGASTRODUODENOSCOPY N/A 06/15/2024   Procedure: EGD (ESOPHAGOGASTRODUODENOSCOPY);  Surgeon: Burnette Fallow, MD;  Location: [facility];  Service: Gastroenterology;  Laterality: N/A;   EUS N/A 06/15/2024   Procedure: ULTRASOUND, UPPER GI TRACT, ENDOSCOPIC;  Surgeon: Burnette Fallow, MD;  Location: [facility];  Service: Gastroenterology;  Laterality: N/A;  fine needle aspiration   GIVENS CAPSULE STUDY N/A 03/02/2024   Procedure: IMAGING PROCEDURE, GI TRACT, INTRALUMINAL,  VIA CAPSULE;  Surgeon: Cindie Carlin POUR, DO;  Location: [facility];  Service: Endoscopy;  Laterality: N/A;   HEMATOMA EVACUATION Left 08/13/2018   Procedure: EVACUATION HEMATOMA LEFT ABDOMINAL WALL;  Surgeon: Oris Krystal FALCON, MD;  Location: [facility] OR;  Service: Vascular;  Laterality: Left;   HEMOSTASIS CLIP PLACEMENT  03/01/2024   Procedure: CONTROL OF HEMORRHAGE, GI TRACT, ENDOSCOPIC, BY CLIPPING OR OVERSEWING;  Surgeon: Eartha Angelia Sieving, MD;  Location: [facility];  Service: Gastroenterology;;   IR IMAGING GUIDED PORT INSERTION  06/23/2024   IR RADIOLOGIST EVAL & MGMT  07/27/2018   IR US  GUIDE BX ASP/DRAIN  07/14/2018   JOINT REPLACEMENT  both hips replaced    LUMBAR LAMINECTOMY/DECOMPRESSION MICRODISCECTOMY Right 10/17/2014   Procedure: LUMBAR LAMINECTOMY/DECOMPRESSION MICRODISCECTOMY 2 LEVELS;  Surgeon: Gerldine Maizes, MD;  Location: [facility];  Service: Neurosurgery;  Laterality: Right;  Right L45 L5S1 laminectomy and foraminotomy   MASS EXCISION  09/13/2012   Procedure: EXCISION MASS;  Surgeon: Oneil DELENA Budge, MD;  Location: [facility];  Service: General;  Laterality: N/A;   POLYPECTOMY  03/01/2024   Procedure: POLYPECTOMY;  Surgeon: Eartha Angelia Sieving, MD;  Location: [facility];  Service: Gastroenterology;;   ROBOTIC ASSITED PARTIAL NEPHRECTOMY Left 03/30/2019   Procedure: XI ROBOTIC ASSITED PARTIAL NEPHRECTOMY POSSIBLE RADICAL NEPHRECTOMY;  Surgeon: Devere Lonni Righter, MD;  Location: [facility];  Service: Urology;  Laterality: Left;   SHOULDER ARTHROSCOPY WITH ROTATOR CUFF REPAIR Right 04/12/2020   Procedure: right shoulder arthroscopy, debridement, mini open rotator cuff tear repair;  Surgeon: Addie Cordella Hamilton, MD;  Location: [facility] OR;  Service: Orthopedics;  Laterality: Right;   TONSILLECTOMY     TOTAL HIP ARTHROPLASTY Right 03/20/2015   TOTAL HIP ARTHROPLASTY Right 03/20/2015   Procedure: TOTAL HIP ARTHROPLASTY ANTERIOR APPROACH;  Surgeon: Evalene JONETTA Chancy, MD;   Location: [facility] OR;  Service: Orthopedics;  Laterality: Right;   TOTAL HIP ARTHROPLASTY Left 01/08/2016   Procedure: TOTAL HIP ARTHROPLASTY ANTERIOR APPROACH;  Surgeon: Evalene JONETTA Chancy, MD;  Location: [facility] OR;  Service: Orthopedics;  Laterality: Left;    Family History  Problem Relation Age of Onset   Heart disease Father    Colon cancer Neg Hx    Colon polyps Neg Hx    Social History   Occupational History   Not on file  Tobacco Use   Smoking status: Some Days    Current packs/day: 0.25    Average packs/day: 0.3 packs/day for 47.0 years (11.8 ttl pk-yrs)    Types: Cigarettes    Passive exposure: Current   Smokeless tobacco: Never   Tobacco comments:    08/03/2024 Patient smokes 2 cigarettes daily    Currently smokes about 2 cigarettes per day.  07/14/2023 hfb    1 a day as of 08/03/2024  Vaping Use   Vaping status: Never Used  Substance and Sexual Activity   Alcohol use: Not Currently    Comment: quit 2012   Drug use: Not Currently    Types: Cocaine    Comment: many yrs ago., last time- late 2016   Sexual activity: Yes    Tobacco Counseling Ready to quit: No Counseling given: Yes Tobacco comments: 08/03/2024 Patient smokes 2 cigarettes daily Currently smokes about 2 cigarettes per day.  07/14/2023 hfb 1 a day as of 08/03/2024   Immunizations and Health Maintenance Immunization History  Administered Date(s) Administered   INFLUENZA, HIGH DOSE SEASONAL PF 08/03/2024   Influenza, Seasonal, Injecte, Preservative Fre 10/01/2023   Influenza,inj,Quad PF,6+ Mos 10/18/2014, 09/27/2015, 09/16/2017   Moderna Sars-Covid-2 Vaccination 03/22/2020, 04/19/2020   PNEUMOCOCCAL CONJUGATE-20 01/01/2024   Pneumococcal Polysaccharide-23 03/09/2013, 10/18/2014, 09/27/2015, 06/18/2018   Tdap 09/27/2015   Health Maintenance Due  Topic Date Due   Zoster Vaccines- Shingrix (1 of 2) Never done   COVID-19 Vaccine (3 - Moderna risk series) 05/17/2020   HEMOGLOBIN A1C  06/30/2024     Activities of Daily Living    08/03/2024   11:00 AM 08/03/2024   10:34 AM  In your present state of health, do you have any difficulty performing the following activities:  Hearing? 1 1  Vision? 1 1  Difficulty concentrating or making decisions? 1  1  Walking or climbing stairs? 1 0  Dressing or bathing?  0  Doing errands, shopping?  0  Preparing Food and eating ?  Y  Using the Toilet?  N  In the past six months, have you accidently leaked urine?  N  Do you have problems with loss of bowel control?  N  Managing your Medications?  Y  Managing your Finances?  Y  Housekeeping or managing your Housekeeping?  Y     Physical Exam Vitals reviewed.  Constitutional:      General: He is not in acute distress.    Appearance: He is not diaphoretic.  HENT:     Head: Normocephalic and atraumatic.     Nose: Nose normal.     Mouth/Throat:     Mouth: Mucous membranes are moist.  Eyes:     General: No scleral icterus.    Extraocular Movements: Extraocular movements intact.  Cardiovascular:     Rate and Rhythm: Normal rate and regular rhythm.     Heart sounds: Normal heart sounds. No murmur heard. Pulmonary:     Breath sounds: Normal breath sounds. No wheezing or rales.  Musculoskeletal:     Cervical back: Neck supple. No tenderness.     Right lower leg: No edema.     Left lower leg: No edema.  Skin:    General: Skin is warm.     Findings: No rash.  Neurological:     General: No focal deficit present.     Mental Status: He is alert and oriented to person, place, and time.     Motor: Weakness (RUE and RLE - 4/5) present.     Comments: Expressive aphasia -improved  Psychiatric:        Mood and Affect: Mood is depressed.        Behavior: Behavior normal.      Advanced Directives: Does Patient Have a Medical Advance Directive?: No Would patient like information on creating a medical advance directive?: No - Patient declined   EKG (05/25): Sinus rhythm. PVC noted. No signs of  active ischemia.     Assessment:    This is a routine wellness  examination for this patient.  Vision/Hearing screen No results found.   Goals      Eat Healthy     Would like to feel better and do better with health choices.          Depression Screen    08/03/2024   11:03 AM 07/27/2024    8:41 AM 07/19/2024   11:12 AM 06/22/2024    1:05 PM  PHQ 2/9 Scores  PHQ - 2 Score 2 0 1 0  PHQ- 9 Score 9        Fall Risk    08/03/2024   10:34 AM  Fall Risk   Falls in the past year? 1  Number falls in past yr: 1  Injury with Fall? 0    Cognitive Function        08/03/2024   11:06 AM  6CIT Screen  What Year? 0 points  What month? 0 points  What time? 0 points  Count back from 20 4 points  Months in reverse 4 points  Repeat phrase 2 points  Total Score 10 points    Patient Care Team: [name], MD as PCP - General (Internal Medicine) Francyne Headland, MD as PCP - Cardiology (Cardiology) Beuford Anes, MD as Consulting Physician (Orthopedic Surgery) Devere Lonni Righter, MD as Consulting Physician (Urology)  Skeet Juliene SAUNDERS, DO as Consulting Physician (Neurology)     Plan:     Encounter for Medicare annual wellness exam Screening questionnaire reviewed. Immunization and cancer screening needs are specifically addressed at this visit. Advised to get Shingrix vaccine at local pharmacy.  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 suggestive gastric adenocarcinoma Followed by GI and oncology - undergoing chemotherapy  Type 2 diabetes mellitus with other specified complication (HCC) Lab Results  Component Value Date   HGBA1C 6.8 (H) 01/01/2024    Well controlled Associated with HTN, HLD and h/o CVA On Jardiance  10 mg QD, needs to be compliant Advised to follow diabetic diet Needs to be on statin, on ACEi -restart atorvastatin  F/u CMP, HbA1c and lipid panel Diabetic eye exam: Advised to follow up with Ophthalmology for diabetic eye exam    I have personally reviewed and noted the following in the patient's chart:   Medical and social history Use of alcohol, tobacco or illicit drugs  Current medications and supplements including opioid prescriptions. Patient is not currently taking opioid prescriptions. Functional ability and status Nutritional status Physical activity Advanced directives List of other physicians Hospitalizations, surgeries, and ER visits in previous 12 months Vitals Screenings to include cognitive, depression, and falls Referrals and appointments  In addition, I have reviewed and discussed with patient certain preventive protocols, quality metrics, and best practice recommendations. A written personalized care plan for preventive services as well as general preventive health recommendations were provided to patient.     Suzzane MARLA Blanch, MD 08/03/2024

## 2024-08-03 NOTE — Assessment & Plan Note (Signed)
 Lab Results  Component Value Date   HGBA1C 6.8 (H) 01/01/2024   Well controlled Associated with HTN, HLD and h/o CVA On Jardiance 10 mg QD, needs to be compliant Advised to follow diabetic diet Needs to be on statin, on ACEi -restart atorvastatin F/u CMP, HbA1c and lipid panel Diabetic eye exam: Advised to follow up with Ophthalmology for diabetic eye exam

## 2024-08-03 NOTE — Assessment & Plan Note (Signed)
 EGD showed gastritis and biopsy suggestive gastric adenocarcinoma Followed by GI and oncology - undergoing chemotherapy

## 2024-08-03 NOTE — Assessment & Plan Note (Signed)
 Screening questionnaire reviewed. Immunization and cancer screening needs are specifically addressed at this visit. Advised to get Shingrix vaccine at local pharmacy.

## 2024-08-03 NOTE — Patient Instructions (Signed)
 Please continue to take medications as prescribed.  Please continue to follow low carb diet and ambulate as tolerated.  Please consider getting Shingrix vaccine at local pharmacy.

## 2024-08-04 ENCOUNTER — Other Ambulatory Visit: Payer: Self-pay

## 2024-08-04 NOTE — Telephone Encounter (Signed)
 We can lower it to 5cm h20, that is the lowest it goes

## 2024-08-05 ENCOUNTER — Ambulatory Visit: Attending: Cardiovascular Disease | Admitting: Cardiovascular Disease

## 2024-08-05 ENCOUNTER — Encounter: Payer: Self-pay | Admitting: Cardiovascular Disease

## 2024-08-05 VITALS — BP 110/76 | HR 92 | Ht 78.0 in | Wt 235.0 lb

## 2024-08-05 DIAGNOSIS — I672 Cerebral atherosclerosis: Secondary | ICD-10-CM | POA: Diagnosis not present

## 2024-08-05 DIAGNOSIS — I429 Cardiomyopathy, unspecified: Secondary | ICD-10-CM | POA: Insufficient documentation

## 2024-08-05 DIAGNOSIS — I69320 Aphasia following cerebral infarction: Secondary | ICD-10-CM | POA: Diagnosis not present

## 2024-08-05 DIAGNOSIS — I428 Other cardiomyopathies: Secondary | ICD-10-CM | POA: Diagnosis not present

## 2024-08-05 DIAGNOSIS — E782 Mixed hyperlipidemia: Secondary | ICD-10-CM | POA: Diagnosis not present

## 2024-08-05 DIAGNOSIS — Z905 Acquired absence of kidney: Secondary | ICD-10-CM

## 2024-08-05 DIAGNOSIS — E1159 Type 2 diabetes mellitus with other circulatory complications: Secondary | ICD-10-CM | POA: Diagnosis not present

## 2024-08-05 DIAGNOSIS — Z01810 Encounter for preprocedural cardiovascular examination: Secondary | ICD-10-CM

## 2024-08-05 DIAGNOSIS — Z85528 Personal history of other malignant neoplasm of kidney: Secondary | ICD-10-CM

## 2024-08-05 DIAGNOSIS — N1832 Chronic kidney disease, stage 3b: Secondary | ICD-10-CM | POA: Diagnosis not present

## 2024-08-05 NOTE — Patient Instructions (Addendum)
 Medication Instructions:  Stop Amlodipine  *If you need a refill on your cardiac medications before your next appointment, please call your pharmacy*  Lab Work: None ordered If you have labs (blood work) drawn today and your tests are completely normal, you will receive your results only by: MyChart Message (if you have MyChart) OR A paper copy in the mail If you have any lab test that is abnormal or we need to change your treatment, we will call you to review the results.  Testing/Procedures: None ordered  Follow-Up: At [facility], you and your health needs are our priority.  As part of our continuing mission to provide you with exceptional heart care, our providers are all part of one team.  This team includes your primary Cardiologist (physician) and Advanced Practice Providers or APPs (Physician Assistants and Nurse Practitioners) who all work together to provide you with the care you need, when you need it.  Your next appointment:   3 months with APP  8 months with Dr Francyne  Low-Sodium Eating Plan Salt (sodium) helps you keep a healthy balance of fluids in your body. Too much sodium can raise your blood pressure. It can also cause fluid and waste to be held in your body. Your health care provider or dietitian may recommend a low-sodium eating plan if you have high blood pressure (hypertension), kidney disease, liver disease, or heart failure. Eating less sodium can help lower your blood pressure and reduce swelling. It can also protect your heart, liver, and kidneys. What are tips for following this plan? Reading food labels  Check food labels for the amount of sodium per serving. If you eat more than one serving, you must multiply the listed amount by the number of servings. Choose foods with less than 140 milligrams (mg) of sodium per serving. Avoid foods with 300 mg of sodium or more per serving. Always check how much sodium is in a product, even if the label says  unsalted or no salt added. Shopping  Buy products labeled as low-sodium or no salt added. Buy fresh foods. Avoid canned foods and pre-made or frozen meals. Avoid canned, cured, or processed meats. Buy breads that have less than 80 mg of sodium per slice. Cooking  Eat more home-cooked food. Try to eat less restaurant, buffet, and fast food. Try not to add salt when you cook. Use salt-free seasonings or herbs instead of table salt or sea salt. Check with your provider or pharmacist before using salt substitutes. Cook with plant-based oils, such as canola, sunflower, or olive oil. Meal planning When eating at a restaurant, ask if your food can be made with less salt or no salt. Avoid dishes labeled as brined, pickled, cured, or smoked. Avoid dishes made with soy sauce, miso, or teriyaki sauce. Avoid foods that have monosodium glutamate (MSG) in them. MSG may be added to some restaurant food, sauces, soups, bouillon, and canned foods. Make meals that can be grilled, baked, poached, roasted, or steamed. These are often made with less sodium. General information Try to limit your sodium intake to 1,500-2,300 mg each day, or the amount told by your provider. What foods should I eat? Fruits Fresh, frozen, or canned fruit. Fruit juice. Vegetables Fresh or frozen vegetables. No salt added canned vegetables. No salt added tomato sauce and paste. Low-sodium or reduced-sodium tomato and vegetable juice. Grains Low-sodium cereals, such as oats, puffed wheat and rice, and shredded wheat. Low-sodium crackers. Unsalted rice. Unsalted pasta. Low-sodium bread. Whole grain breads  and whole grain pasta. Meats and other proteins Fresh or frozen meat, poultry, seafood, and fish. These should have no added salt. Low-sodium canned tuna and salmon. Unsalted nuts. Dried peas, beans, and lentils without added salt. Unsalted canned beans. Eggs. Unsalted nut butters. Dairy Milk. Soy milk. Cheese that is  naturally low in sodium, such as ricotta cheese, fresh mozzarella, or Swiss cheese. Low-sodium or reduced-sodium cheese. Cream cheese. Yogurt. Seasonings and condiments Fresh and dried herbs and spices. Salt-free seasonings. Low-sodium mustard and ketchup. Sodium-free salad dressing. Sodium-free light mayonnaise. Fresh or refrigerated horseradish. Lemon juice. Vinegar. Other foods Homemade, reduced-sodium, or low-sodium soups. Unsalted popcorn and pretzels. Low-salt or salt-free chips. The items listed above may not be all the foods and drinks you can have. Talk to a dietitian to learn more. What foods should I avoid? Vegetables Sauerkraut, pickled vegetables, and relishes. Olives. [place] fries. Onion rings. Regular canned vegetables, except low-sodium or reduced-sodium items. Regular canned tomato sauce and paste. Regular tomato and vegetable juice. Frozen vegetables in sauces. Grains Instant hot cereals. Bread stuffing, pancake, and biscuit mixes. Croutons. Seasoned rice or pasta mixes. Noodle soup cups. Boxed or frozen macaroni and cheese. Regular salted crackers. Self-rising flour. Meats and other proteins Meat or fish that is salted, canned, smoked, spiced, or pickled. Precooked or cured meat, such as sausages or meat loaves. [place]. Ham. Pepperoni. Hot dogs. Corned beef. Chipped beef. Salt pork. Jerky. Pickled herring, anchovies, and sardines. Regular canned tuna. Salted nuts. Dairy Processed cheese and cheese spreads. Hard cheeses. Cheese curds. Blue cheese. Feta cheese. String cheese. Regular cottage cheese. Buttermilk. Canned milk. Fats and oils Salted butter. Regular margarine. Ghee. Bacon fat. Seasonings and condiments Onion salt, garlic salt, seasoned salt, table salt, and sea salt. Canned and packaged gravies. Worcestershire sauce. Tartar sauce. Barbecue sauce. Teriyaki sauce. Soy sauce, including reduced-sodium soy sauce. Steak sauce. Fish sauce. Oyster sauce. Cocktail sauce.  Horseradish that you find on the shelf. Regular ketchup and mustard. Meat flavorings and tenderizers. Bouillon cubes. Hot sauce. Pre-made or packaged marinades. Pre-made or packaged taco seasonings. Relishes. Regular salad dressings. Salsa. Other foods Salted popcorn and pretzels. Corn chips and puffs. Potato and tortilla chips. Canned or dried soups. Pizza. Frozen entrees and pot pies. The items listed above may not be all the foods and drinks you should avoid. Talk to a dietitian to learn more. This information is not intended to replace advice given to you by your health care provider. Make sure you discuss any questions you have with your health care provider. Document Revised: 12/04/2022 Document Reviewed: 12/04/2022 Elsevier Patient Education  2024 Elsevier Inc.   We recommend signing up for the patient portal called MyChart.  Sign up information is provided on this After Visit Summary.  MyChart is used to connect with patients for Virtual Visits (Telemedicine).  Patients are able to view lab/test results, encounter notes, upcoming appointments, etc.  Non-urgent messages can be sent to your provider as well.   To learn more about what you can do with MyChart, go to [email or web address].

## 2024-08-05 NOTE — Progress Notes (Signed)
 Cardiology Office Note:    Date:  08/05/2024   ID:  Raymond CHRISTELLA Shona Mickey., DOB 04/27/1961, MRN [id number]  PCP:  [name], MD  Cardiologist:  [name], MD   Primary MD: [name], MD   No chief complaint on file.   History of Present Illness:    Raymond Barrera. is a 63 y.o. male with a hx of chronic systolic and diastolic heart failure, long-standing hypertension and diabetes mellitus (not requiring insulin ) and hypercholesterolemia (on statin), previous ischemic stroke March 2018 and recurrent TIA in February 2024 with extensive intracranial arterial stenoses, s/p complex lumbar spine surgery, moderate CKD following nephrectomy, OSA on CPAP, history of cocaine use presenting with hypertensive urgency and recurrent neurological deficits, now undergoing neoadjuvant chemotherapy (D-FLOT) for gastric adenocarcinoma with plan for curative resection later this year.  He has lost quite a bit of weight while undergoing chemotherapy.  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  He has not had any chest pain.  He has not had any problems with shortness of breath at rest or with activity and denies edema, orthopnea, PND, palpitations or syncope.  He has substantial orthostatic dizziness and did have a couple of falls last week, thankfully without injury.  Over the years his echocardiograms have shown wide fluctuations in LV systolic function, always with a pattern of global hypokinesis and concentric LVH.  EF appears to vary in relation to use of recreational drugs and severity of hypertension.  Most recent assessment 05/10/2024 showed LVEF 30-35%, with Doppler parameters suggesting normal filling pressures.  He has mild dilation of the ascending aorta but no significant valve abnormalities.  He did not tolerate Entresto  due to renal dysfunction and symptomatic orthostatic hypotension.  He is currently taking Jardiance  and is also on a low-dose of amlodipine .  He  is not taking any true diuretics or beta-blockers.  He is on chronic antiplatelet therapy with Plavix .  He has never undergone formal coronary angiography but he had no evidence of perfusion abnormalities on a nuclear stress test in 2019 when he had a dilated left ventricle with a EF 24%.  He reports that he has been abstinent from his longstanding cocaine habit all year, with negative tox screens in March and May.  He continues to smoke just 1 cigarette a day.  His most recent metabolic profile from last October showed elevated LDL cholesterol 114 and mildly elevated triglycerides at 211.  He is currently on atorvastatin  10 mg daily but I am not sure that this was the case when the labs were drawn a year ago in January glycemic control was good with a hemoglobin A1c 6.8%.  He has stable chronic kidney disease ever since his nephrectomy with creatinine of 1.8 by labs checked just a week ago.  In March 2018 he had a left internal capsule ischemic stroke and he has mild right hand paresis as well as some difficulty with speech and poor short-term memory.  Prior to that in 2014 he had multiple mini strokes in the setting of severe hypertension.    Unfortunately he had another acute neurological event, a TIA that occurred on 01/13/2023.  The presentation was with weakness of the right hand so that he would drop things and mild worsening of his speech.  The MRI/MRA of the head did not show evidence of an acute stroke or acute large vessel occlusion, but several abnormalities of the intracranial circulation were identified.  He had severe  distal left anterior communicating artery stenosis, moderate left proximal P2 segment posterior cerebral artery stenosis, severe distal right intradural vertebral artery stenosis and moderate distal left middle cerebral artery stenosis.  These events occurred in the setting of cocaine induced hypertensive urgency.  The patient has never undergone cardiac catheterization.  He  had normal perfusion on his nuclear stress test in 2019.  On my review of a chest CT angiogram performed March 2018 (performed for rule out pulmonary embolism), there is a limited coronary atherosclerotic calcification, primarily seen in the mid LAD artery.  The ascending aorta is mildly dilated at 4.2 cm.  He has not had any alcohol in 30 years.  He has a history of cocaine use.  His urine drug screens have been positive in 2014, 2018, 2021, 2022, February 2024 and March 2024, but have been mid negative in March and May 2025.  He is currently smoking only 1 cigarette a day, but smoked about a pack a day for over 30 years.    He has had multiple surgical procedures in the last for 5 years such as hip replacement, ankle surgery and cervical spine surgery and lumbar spine surgery.    Past Medical History:  Diagnosis Date   Ambulates with cane    straight - uses occasionally   Anxiety    due to the stroke   Arthritis    Back pain    hx of buldging disc   Complication of anesthesia    took a while for him to wake up after previous anesthesia   CVA (cerebral vascular accident) (HCC) 10/07/2020   Diabetes mellitus without complication (HCC)    Family history of adverse reaction to anesthesia    sometimes mom has a hard time waking up   High cholesterol    takes Zocor  daily   Hypertension    takes Benazepril  and HCTZ  daily   Joint pain    Joint swelling    Memory impairment    occassional - from stroke   Myocardial infarction (HCC) 1987   Pneumonia    hx of-80's   Shortness of breath dyspnea    do to pain   Sleep apnea    never had a sleep study,but states Dr. Rennis says he has it   Slurred speech    Stroke (HCC) 08/2013   7 mini-strokes, last stroke 2017   TIA (transient ischemic attack) 2014   x 7    Urinary frequency     Past Surgical History:  Procedure Laterality Date   ABDOMINAL EXPOSURE N/A 06/16/2018   Procedure: ABDOMINAL EXPOSURE;  Surgeon: Oris Krystal FALCON,  MD;  Location: [facility] OR;  Service: Vascular;  Laterality: N/A;   ANKLE SURGERY  2008   left ankle-otif-[facility]   ANTERIOR LUMBAR FUSION Bilateral 06/16/2018   Procedure: LUMBAR 4-5 LUMBAR 5-SACRUM 1 ANTERIOR LUMBAR INTERBODY FUSION WITH INSTRUMENTATION AND ALLOGRAFT;  Surgeon: Beuford Anes, MD;  Location: [facility] OR;  Service: Orthopedics;  Laterality: Bilateral;   BACK SURGERY     COLONOSCOPY N/A 03/01/2024   Procedure: COLONOSCOPY;  Surgeon: Eartha Angelia Sieving, MD;  Location: [facility];  Service: Gastroenterology;  Laterality: N/A;   ENTEROSCOPY N/A 04/19/2024   Procedure: ENTEROSCOPY;  Surgeon: Cinderella Deatrice FALCON, MD;  Location: [facility];  Service: Endoscopy;  Laterality: N/A;  9:0AM;ASA 3   ESOPHAGOGASTRODUODENOSCOPY N/A 03/01/2024   Procedure: EGD (ESOPHAGOGASTRODUODENOSCOPY);  Surgeon: Eartha Angelia, Sieving, MD;  Location: [facility];  Service: Gastroenterology;  Laterality: N/A;  ESOPHAGOGASTRODUODENOSCOPY N/A 06/15/2024   Procedure: EGD (ESOPHAGOGASTRODUODENOSCOPY);  Surgeon: Burnette Fallow, MD;  Location: [facility];  Service: Gastroenterology;  Laterality: N/A;   EUS N/A 06/15/2024   Procedure: ULTRASOUND, UPPER GI TRACT, ENDOSCOPIC;  Surgeon: Burnette Fallow, MD;  Location: [facility];  Service: Gastroenterology;  Laterality: N/A;  fine needle aspiration   GIVENS CAPSULE STUDY N/A 03/02/2024   Procedure: IMAGING PROCEDURE, GI TRACT, INTRALUMINAL, VIA CAPSULE;  Surgeon: Cindie Carlin POUR, DO;  Location: [facility];  Service: Endoscopy;  Laterality: N/A;   HEMATOMA EVACUATION Left 08/13/2018   Procedure: EVACUATION HEMATOMA LEFT ABDOMINAL WALL;  Surgeon: Oris Krystal FALCON, MD;  Location: [facility] OR;  Service: Vascular;  Laterality: Left;   HEMOSTASIS CLIP PLACEMENT  03/01/2024   Procedure: CONTROL OF HEMORRHAGE, GI TRACT, ENDOSCOPIC, BY CLIPPING OR OVERSEWING;  Surgeon: Eartha Angelia Sieving, MD;  Location: [facility];  Service: Gastroenterology;;   IR IMAGING GUIDED PORT  INSERTION  06/23/2024   IR RADIOLOGIST EVAL & MGMT  07/27/2018   IR US  GUIDE BX ASP/DRAIN  07/14/2018   JOINT REPLACEMENT     both hips replaced    LUMBAR LAMINECTOMY/DECOMPRESSION MICRODISCECTOMY Right 10/17/2014   Procedure: LUMBAR LAMINECTOMY/DECOMPRESSION MICRODISCECTOMY 2 LEVELS;  Surgeon: Gerldine Maizes, MD;  Location: [facility];  Service: Neurosurgery;  Laterality: Right;  Right L45 L5S1 laminectomy and foraminotomy   MASS EXCISION  09/13/2012   Procedure: EXCISION MASS;  Surgeon: Oneil DELENA Budge, MD;  Location: [facility];  Service: General;  Laterality: N/A;   POLYPECTOMY  03/01/2024   Procedure: POLYPECTOMY;  Surgeon: Eartha Angelia Sieving, MD;  [facility];  Service: Gastroenterology;;   ROBOTIC ASSITED PARTIAL NEPHRECTOMY Left 03/30/2019   Procedure: XI ROBOTIC ASSITED PARTIAL NEPHRECTOMY POSSIBLE RADICAL NEPHRECTOMY;  Surgeon: Devere Lonni Righter, MD;  Location: [facility];  Service: Urology;  Laterality: Left;   SHOULDER ARTHROSCOPY WITH ROTATOR CUFF REPAIR Right 04/12/2020   Procedure: right shoulder arthroscopy, debridement, mini open rotator cuff tear repair;  Surgeon: Addie Cordella Hamilton, MD;  Location: [facility] OR;  Service: Orthopedics;  Laterality: Right;   TONSILLECTOMY     TOTAL HIP ARTHROPLASTY Right 03/20/2015   TOTAL HIP ARTHROPLASTY Right 03/20/2015   Procedure: TOTAL HIP ARTHROPLASTY ANTERIOR APPROACH;  Surgeon: Evalene JONETTA Chancy, MD;  Location: [facility] OR;  Service: Orthopedics;  Laterality: Right;   TOTAL HIP ARTHROPLASTY Left 01/08/2016   Procedure: TOTAL HIP ARTHROPLASTY ANTERIOR APPROACH;  Surgeon: Evalene JONETTA Chancy, MD;  Location: [facility] OR;  Service: Orthopedics;  Laterality: Left;    Current Medications: No outpatient medications have been marked as taking for the 08/05/24 encounter (Office Visit) with Omarri Eich, Jerel, MD.     Allergies:   Poison ivy extract [poison ivy extract] and Coreg  [carvedilol ]    Family History: The patient's family history includes  Heart disease in his father. There is no history of Colon cancer or Colon polyps.  ROS:   Please see the history of present illness.     All other systems reviewed and are negative.  EKGs/Labs/Other Studies Reviewed:    The following studies were reviewed today: Echo 05/09/2024 1. Left ventricular ejection fraction, by estimation, is 30 to 35%. Left  ventricular ejection fraction by 2D MOD biplane is 32.2 %. The left  ventricle has moderate to severely decreased function. The left ventricle  demonstrates global hypokinesis. There   is mild left ventricular hypertrophy. Left ventricular diastolic  parameters are consistent with Grade I diastolic dysfunction (impaired  relaxation).   2. Right ventricular systolic  function is low normal. The right  ventricular size is mildly enlarged.   3. Left atrial size was mildly dilated.   4. The mitral valve is normal in structure. Mild mitral valve  regurgitation.   5. The aortic valve is tricuspid. Aortic valve regurgitation is not  visualized. Aortic valve sclerosis/calcification is present, without any  evidence of aortic stenosis. Aortic valve mean gradient measures 5.0 mmHg.   6. Aortic dilatation noted. There is mild dilatation of the ascending  aorta, measuring 42 mm.   EKG: Personally reviewed the most recent tracing from 04/22/2024 which shows sinus rhythm with occasional PVCs and voltage criteria for left ventricular hypertrophy.  EKG Interpretation Date/Time:    Ventricular Rate:    PR Interval:    QRS Duration:    QT Interval:    QTC Calculation:   R Axis:      Text Interpretation:           Recent Labs: 07/27/2024: ALT 21; BUN 36; Creatinine, Ser 1.82; Hemoglobin 11.2; Magnesium  2.1; Platelets 212; Potassium 4.3; Sodium 139; TSH 0.680  Recent Lipid Panel    Component Value Date/Time   CHOL 190 10/01/2023 0955   TRIG 211 (H) 10/01/2023 0955   HDL 39 (L) 10/01/2023 0955   CHOLHDL 4.9 10/01/2023 0955   CHOLHDL 7.1  01/14/2023 0318   VLDL UNABLE TO CALCULATE IF TRIGLYCERIDE OVER 400 mg/dL 97/85/7975 9681   LDLCALC 114 (H) 10/01/2023 0955   LDLDIRECT 107.9 (H) 01/14/2023 0318    Physical Exam:    VS:  BP 110/76 (BP Location: Left Arm, Patient Position: Sitting, Cuff Size: Normal)   Pulse 92   Ht 6' 6 (1.981 m)   Wt 235 lb (106.6 kg)   SpO2 98%   BMI 27.16 kg/m     Wt Readings from Last 3 Encounters:  08/05/24 235 lb (106.6 kg)  08/03/24 230 lb 3.2 oz (104.4 kg)  07/27/24 240 lb 12.8 oz (109.2 kg)      General: Alert, oriented x3, no distress, overweight Head: no evidence of trauma, PERRL, EOMI, no exophtalmos or lid lag, no myxedema, no xanthelasma; normal ears, nose and oropharynx Neck: normal jugular venous pulsations and no hepatojugular reflux; brisk carotid pulses without delay and no carotid bruits Chest: clear to auscultation, no signs of consolidation by percussion or palpation, normal fremitus, symmetrical and full respiratory excursions Cardiovascular: normal position and quality of the apical impulse, regular rhythm, normal first and second heart sounds, no murmurs, rubs or gallops Abdomen: no tenderness or distention, no masses by palpation, no abnormal pulsatility or arterial bruits, normal bowel sounds, no hepatosplenomegaly Extremities: no clubbing, cyanosis or edema; 2+ radial, ulnar and brachial pulses bilaterally; 2+ right femoral, posterior tibial and dorsalis pedis pulses; 2+ left femoral, posterior tibial and dorsalis pedis pulses; no subclavian or femoral bruits Neurological: Mildly dysarthric, otherwise nonfocal exam Psych: Normal mood and affect    ASSESSMENT:    1. Other cardiomyopathy (HCC)   2. Aphasia as late effect of cerebrovascular accident (CVA)   3. Atherosclerotic cerebrovascular disease   4. Mixed hyperlipidemia   5. Type 2 diabetes mellitus with other circulatory complication, without long-term current use of insulin  (HCC)   6. Stage 3b chronic kidney  disease (HCC)   7. History of renal cell carcinoma   8. H/O radical nephrectomy   9. Preoperative cardiovascular examination      PLAN:    In order of problems listed above:   CMP: He has never undergone coronary angiography.  He has had no complaints of angina pectoris and we have avoided angiography due to kidney dysfunction.  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  NYHA functional class I.  He appears to be clinically euvolemic without needing loop diuretics.  In fact he needs to make sure he drinks enough fluids since he is taking the SGLT2 inhibitor.  He appears to be clinically euvolemic although he is not taking any loop diuretics.  Entresto  was not tolerated due to worsening renal dysfunction and symptomatic hypotension and at this point his blood pressure really does not allow additional heart failure medications.  In fact he has had a couple of falls with a pattern suggestive of orthostatic hypotension.  Will stop his amlodipine .  Suspect that the wide variation in his ejection fraction of last year was related to varying degrees of blood pressure control and use of cocaine.  S  history of CVA/intracranial atherosclerotic cerebrovascular disease: He is more vulnerable to hypotension due to extensive intracranial arterial disease.   HLP: Target LDL should be less than 70.  He is on atorvastatin  10 mg daily although not sure he was on this when the labs were last checked. DM: Well-controlled with Jardiance  monotherapy. CKD 3:  Post surgical single kidney.  Renal function has been quite stable since his nephrectomy.SABRA  He did not tolerate Entresto  due to worsening renal function.   Discussed avoidance of contrast studies, NSAIDs, other nephrotoxic agents, excessive diuresis. History of cocaine use: Congratulated on abstinence from cocaine.  Ideally he would also quit that 1 cigarette a day that he still smokes. Preop CV  evaluation: Plan for him to undergo gastrectomy later this year.  Unless there is a significant change in his clinical status, I think he will continue to be at acceptable cardiovascular risk.  Despite the fact that he has decreased LVEF he has been very well compensated from a cardiac point of view, even though we have not been able to provide full guideline directed medical therapy   Medication Adjustments/Labs and Tests Ordered: Current medicines are reviewed at length with the patient today.  Concerns regarding medicines are outlined above.  No orders of the defined types were placed in this encounter.  No orders of the defined types were placed in this encounter.   Patient Instructions  Medication Instructions:  Stop Amlodipine  *If you need a refill on your cardiac medications before your next appointment, please call your pharmacy*  Lab Work: None ordered If you have labs (blood work) drawn today and your tests are completely normal, you will receive your results only by: MyChart Message (if you have MyChart) OR A paper copy in the mail If you have any lab test that is abnormal or we need to change your treatment, we will call you to review the results.  Testing/Procedures: None ordered  Follow-Up: At [facility], you and your health needs are our priority.  As part of our continuing mission to provide you with exceptional heart care, our providers are all part of one team.  This team includes your primary Cardiologist (physician) and Advanced Practice Providers or APPs (Physician Assistants and Nurse Practitioners) who all work together to provide you with the care you need, when you need it.  Your next appointment:   3 months with APP  8 months with Dr Francyne  Low-Sodium Eating Plan Salt (sodium) helps you keep a healthy balance of fluids in your body. Too much sodium  can raise your blood pressure. It can also cause fluid and waste to be held in your body. Your  health care provider or dietitian may recommend a low-sodium eating plan if you have high blood pressure (hypertension), kidney disease, liver disease, or heart failure. Eating less sodium can help lower your blood pressure and reduce swelling. It can also protect your heart, liver, and kidneys. What are tips for following this plan? Reading food labels  Check food labels for the amount of sodium per serving. If you eat more than one serving, you must multiply the listed amount by the number of servings. Choose foods with less than 140 milligrams (mg) of sodium per serving. Avoid foods with 300 mg of sodium or more per serving. Always check how much sodium is in a product, even if the label says unsalted or no salt added. Shopping  Buy products labeled as low-sodium or no salt added. Buy fresh foods. Avoid canned foods and pre-made or frozen meals. Avoid canned, cured, or processed meats. Buy breads that have less than 80 mg of sodium per slice. Cooking  Eat more home-cooked food. Try to eat less restaurant, buffet, and fast food. Try not to add salt when you cook. Use salt-free seasonings or herbs instead of table salt or sea salt. Check with your provider or pharmacist before using salt substitutes. Cook with plant-based oils, such as canola, sunflower, or olive oil. Meal planning When eating at a restaurant, ask if your food can be made with less salt or no salt. Avoid dishes labeled as brined, pickled, cured, or smoked. Avoid dishes made with soy sauce, miso, or teriyaki sauce. Avoid foods that have monosodium glutamate (MSG) in them. MSG may be added to some restaurant food, sauces, soups, bouillon, and canned foods. Make meals that can be grilled, baked, poached, roasted, or steamed. These are often made with less sodium. General information Try to limit your sodium intake to 1,500-2,300 mg each day, or the amount told by your provider. What foods should I eat? Fruits Fresh,  frozen, or canned fruit. Fruit juice. Vegetables Fresh or frozen vegetables. No salt added canned vegetables. No salt added tomato sauce and paste. Low-sodium or reduced-sodium tomato and vegetable juice. Grains Low-sodium cereals, such as oats, puffed wheat and rice, and shredded wheat. Low-sodium crackers. Unsalted rice. Unsalted pasta. Low-sodium bread. Whole grain breads and whole grain pasta. Meats and other proteins Fresh or frozen meat, poultry, seafood, and fish. These should have no added salt. Low-sodium canned tuna and salmon. Unsalted nuts. Dried peas, beans, and lentils without added salt. Unsalted canned beans. Eggs. Unsalted nut butters. Dairy Milk. Soy milk. Cheese that is naturally low in sodium, such as ricotta cheese, fresh mozzarella, or Swiss cheese. Low-sodium or reduced-sodium cheese. Cream cheese. Yogurt. Seasonings and condiments Fresh and dried herbs and spices. Salt-free seasonings. Low-sodium mustard and ketchup. Sodium-free salad dressing. Sodium-free light mayonnaise. Fresh or refrigerated horseradish. Lemon juice. Vinegar. Other foods Homemade, reduced-sodium, or low-sodium soups. Unsalted popcorn and pretzels. Low-salt or salt-free chips. The items listed above may not be all the foods and drinks you can have. Talk to a dietitian to learn more. What foods should I avoid? Vegetables Sauerkraut, pickled vegetables, and relishes. Olives. [place] fries. Onion rings. Regular canned vegetables, except low-sodium or reduced-sodium items. Regular canned tomato sauce and paste. Regular tomato and vegetable juice. Frozen vegetables in sauces. Grains Instant hot cereals. Bread stuffing, pancake, and biscuit mixes. Croutons. Seasoned rice or pasta mixes. Noodle soup cups. Boxed  or frozen macaroni and cheese. Regular salted crackers. Self-rising flour. Meats and other proteins Meat or fish that is salted, canned, smoked, spiced, or pickled. Precooked or cured meat, such as  sausages or meat loaves. [place]. Ham. Pepperoni. Hot dogs. Corned beef. Chipped beef. Salt pork. Jerky. Pickled herring, anchovies, and sardines. Regular canned tuna. Salted nuts. Dairy Processed cheese and cheese spreads. Hard cheeses. Cheese curds. Blue cheese. Feta cheese. String cheese. Regular cottage cheese. Buttermilk. Canned milk. Fats and oils Salted butter. Regular margarine. Ghee. Bacon fat. Seasonings and condiments Onion salt, garlic salt, seasoned salt, table salt, and sea salt. Canned and packaged gravies. Worcestershire sauce. Tartar sauce. Barbecue sauce. Teriyaki sauce. Soy sauce, including reduced-sodium soy sauce. Steak sauce. Fish sauce. Oyster sauce. Cocktail sauce. Horseradish that you find on the shelf. Regular ketchup and mustard. Meat flavorings and tenderizers. Bouillon cubes. Hot sauce. Pre-made or packaged marinades. Pre-made or packaged taco seasonings. Relishes. Regular salad dressings. Salsa. Other foods Salted popcorn and pretzels. Corn chips and puffs. Potato and tortilla chips. Canned or dried soups. Pizza. Frozen entrees and pot pies. The items listed above may not be all the foods and drinks you should avoid. Talk to a dietitian to learn more. This information is not intended to replace advice given to you by your health care provider. Make sure you discuss any questions you have with your health care provider. Document Revised: 12/04/2022 Document Reviewed: 12/04/2022 Elsevier Patient Education  2024 Elsevier Inc.   We recommend signing up for the patient portal called MyChart.  Sign up information is provided on this After Visit Summary.  MyChart is used to connect with patients for Virtual Visits (Telemedicine).  Patients are able to view lab/test results, encounter notes, upcoming appointments, etc.  Non-urgent messages can be sent to your provider as well.   To learn more about what you can do with MyChart, go to [email or web address].                  Signed, [name], MD  08/05/2024 2:54 PM    [facility]

## 2024-08-08 ENCOUNTER — Telehealth: Payer: Self-pay | Admitting: Dietician

## 2024-08-08 ENCOUNTER — Telehealth: Payer: Self-pay

## 2024-08-08 ENCOUNTER — Inpatient Hospital Stay: Attending: Oncology | Admitting: Dietician

## 2024-08-08 DIAGNOSIS — Z79630 Long term (current) use of alkylating agent: Secondary | ICD-10-CM | POA: Insufficient documentation

## 2024-08-08 DIAGNOSIS — Z5111 Encounter for antineoplastic chemotherapy: Secondary | ICD-10-CM | POA: Insufficient documentation

## 2024-08-08 DIAGNOSIS — Z79631 Long term (current) use of antimetabolite agent: Secondary | ICD-10-CM | POA: Insufficient documentation

## 2024-08-08 DIAGNOSIS — Z5189 Encounter for other specified aftercare: Secondary | ICD-10-CM | POA: Insufficient documentation

## 2024-08-08 DIAGNOSIS — C169 Malignant neoplasm of stomach, unspecified: Secondary | ICD-10-CM | POA: Insufficient documentation

## 2024-08-08 DIAGNOSIS — Z79620 Long term (current) use of immunosuppressive biologic: Secondary | ICD-10-CM | POA: Insufficient documentation

## 2024-08-08 DIAGNOSIS — Z79633 Long term (current) use of mitotic inhibitor: Secondary | ICD-10-CM | POA: Insufficient documentation

## 2024-08-08 NOTE — Telephone Encounter (Signed)
 Nutrition Follow-up:  Pt with newly diagnosed gastric cancer. He receiving neoadjuvant D-FLOT Patient is under the care of Dr. Davonna Sermon with patient and wife via telephone. Patient is doing well overall. His appetite is great. Wife as well as mother of patient are preparing meals. Wife is monitoring sodium intake and checks sugars daily. Patient eating several small meals and is drinking one Ensure Complete. Wife reports episode of nausea with vomiting following flu shot on 9/3. He has been having some dizzy spells recently. Seen by cardiology (9/5) who recommended stopping amlodipine .   Medications: reviewed   Labs: 8/27 - glucose 186, BUN 36, Cr 1.82  Anthropometrics: Wt 235 lb on 9/5 decreased from 240 lb 12.8 oz on 8/27   NUTRITION DIAGNOSIS: Food and nutrition related knowledge deficit improving   INTERVENTION:  Reviewed balanced meals/snacks with focus on protein for blood glucose management - will provide cookbook 9/10 Consider CGM given pt to receive steroids during chemotherapy - wife to contact PCP to inquire Recommend monitoring blood sugar + blood pressure given dizzy spells  Continue daily Ensure Complete for added calories and protein  - favor this over glucerna/equivalent given wt loss  Ensure samples to be provided 9/10 [facility] appt    MONITORING, EVALUATION, GOAL: wt trends, intake    NEXT VISIT: Monday October 6 via telephone

## 2024-08-08 NOTE — Progress Notes (Signed)
 This patient is appearing on a report for being at risk of failing the adherence measure for identified medications this calendar year.   Medication Adherence Summary (STAR/HEDIS Monitoring): Adherence Category: cholesterol (statin) and diabetes    Drug Name: Jardiance  10 mg  Sold Date:07/19/2024 Days' Supply: 30  Drug Name: Atorvastatin  40 mg Sold Date:06/21/2024 Days' Supply: 42 - This is not on adherence report     Notes: ? Adherence data pulled from pharmacy claims portal Dr. Annemarie. ? Reviewed barriers to adherence: none identified. ? Plan: Follow up prior to next refill   Dorcas Solian, PharmD Clinical Pharmacist Cell: [phone]

## 2024-08-09 NOTE — Assessment & Plan Note (Signed)
 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  - Currently in remission - Unknown if patient follows with urology at this time

## 2024-08-09 NOTE — Assessment & Plan Note (Addendum)
 Newly diagnosed gastric adenocarcinoma via biopsy of ulcer base on enteroscopy.  Oncology history as below Likely stage I-T2N0M0 (T2-T3 per EUS report) PET scan with no evidence of metastatic disease Evaluated by surgical oncology at [facility] and recommended to follow-up after neoadjuvant chemoimmunotherapy   -Discussed with patient that this stage of gastric adenocarcinoma usually requires perioperative chemoimmunotherapy with D-FLOT regimen per MATTERHORN trial which showed significant PFS benefit. -Considering patient's peripheral neuropathy, would likely dose reduce oxaliplatin after a short period of trial. - Will have a chemo education session scheduled  - Discussed side effects of the chemoimmunotherapy in detail.  Side effects to include nausea, vomiting, diarrhea, loss of appetite, hair loss, peripheral neuropathy.  Immunotherapy side effects to include rash, thyroid  abnormalities, pneumonitis, hepatitis, diarrhea. - Patient is not in agreement to proceed with treatment at this time.  Will plan to start next week. - Will repeat a PET scan to get a baseline prior to start of chemoimmunotherapy  Return to clinic prior to second cycle of chemotherapy to assess for tolerability

## 2024-08-09 NOTE — Assessment & Plan Note (Addendum)
 Patient is a chronic smoker and is currently smoking 1 to 2 cigarettes/day  - Encouraged his attempts to abstain from smoking

## 2024-08-09 NOTE — Progress Notes (Unsigned)
 Patient Care Team: Raymond Suzzane POUR, MD as PCP - General (Internal Medicine) Raymond Headland, MD as PCP - Cardiology (Cardiology) Raymond Anes, MD as Consulting Physician (Orthopedic Surgery) Raymond Lonni Righter, MD as Consulting Physician (Urology) Raymond Juliene SAUNDERS, DO as Consulting Physician (Neurology)  Clinic Day:  08/09/2024  Referring physician: [name], MD   CHIEF COMPLAINT:  CC: Gastric adenocarcinoma    ASSESSMENT & PLAN:   Assessment & Plan: Raymond Barrera.  is a 63 y.o. male with Gastric adenocarcinoma   Assessment & Plan Gastric adenocarcinoma [facility]) Newly diagnosed gastric adenocarcinoma via biopsy of ulcer base on enteroscopy.  Oncology history as below Likely stage I-T2N0M0 (T2-T3 per EUS report) PET scan with no evidence of metastatic disease Evaluated by surgical oncology at [facility] and recommended to follow-up after neoadjuvant chemoimmunotherapy   -Discussed with patient that this stage of gastric adenocarcinoma usually requires perioperative chemoimmunotherapy with D-FLOT regimen per [facility] trial which showed significant PFS benefit. -Considering patient's peripheral neuropathy, would likely dose reduce oxaliplatin  after a short period of trial. - Will have a chemo education session scheduled  - Discussed side effects of the chemoimmunotherapy in detail.  Side effects to include nausea, vomiting, diarrhea, loss of appetite, hair loss, peripheral neuropathy.  Immunotherapy side effects to include rash, thyroid  abnormalities, pneumonitis, hepatitis, diarrhea. - Patient is not in agreement to proceed with treatment at this time.  Will plan to start next week. - Will repeat a PET scan to get a baseline prior to start of chemoimmunotherapy   Return to clinic prior to second cycle of chemotherapy to assess for tolerability Iron  deficiency anemia due to chronic blood loss Iron  deficiency anemia likely secondary to bleeding from stomach cancer.   Improved with iron  supplementation. S/p 1 g Monoferric on 05/10/2024 Symptomatically feels significantly better  - Continue taking iron  pills every other day.  Use MiraLAX  for constipation Tobacco use Patient is a chronic smoker and is currently smoking 1 to 2 cigarettes/day  - Encouraged his attempts to abstain from smoking Stage 3b chronic kidney disease (HCC) Chronic kidney disease likely secondary to only 1 functioning kidney Follows with Dr. 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 Has some difficulty holding his phone in his right hand  - Will have caution while administering platinum based chemotherapy. - Continue taking gabapentin  and follow-up with primary care  The patient understands the plans discussed today and is in agreement with them.  He knows to contact our office if he develops concerns prior to his next appointment.  I provided 30 minutes of face-to-face time during this encounter and > 50% was spent counseling as documented under my assessment and plan.    Raymond Barrera,acting as a Neurosurgeon for Raymond Dry, MD.,have documented all relevant documentation on the behalf of Raymond Dry, MD,as directed by  Raymond Dry, MD while in the presence of Raymond Dry, MD.  I, Raymond Dry MD, have reviewed the above documentation for accuracy and completeness, and I agree with the above.     Raymond Dry, MD  [facility] [place] - A DEPT OF [name] [address] [place] [address] Dept: [phone] Dept Fax: [phone]   No orders of the defined types were placed in this encounter.    ONCOLOGY HISTORY:   Oncology History  Gastric adenocarcinoma (HCC)  04/19/2024 Pathology Results   A. STOMACH, ABNORMAL MUCOSA BIOPSY:  Invasive moderately differentiated adenocarcinoma  Reactive gastropathy with  intestinal metaplasia showing focal dysplasia  Negative for H. pylori    04/29/2024 Initial Diagnosis   Gastric adenocarcinoma (HCC)   05/11/2024 Pathology Results   [facility]is tissue testing:  CLDN18: Positive PD-L1: Positive, CPS: 10 HER2: Negative  MSI: Stable, MMR: Proficient, TMB: Low, 5 mut/Mb  BRAF, RET, NTRK 1/2/3, ARID1A, BCR, CCN E1, CDH 1, EBER, EGFR, FGFR 2, KRAS, MET, PIK3CA, RHOA: Negative   05/19/2024 PET scan   IMPRESSION: Only slight area of asymmetric uptake along the midbody of the stomach with an adjacent metallic clip. Please correlate for the exact location patient's ulcer and neoplasm as reported on endoscopy. No additional areas of abnormal uptake at this time.   06/15/2024 Procedure   EUS for staging:  Staging gastric adenocarcinoma at least T2 (maybe early T3) N0 Mx by EUS.   06/15/2024 Cancer Staging   Staging form: Stomach, AJCC 8th Edition - Clinical stage from 06/15/2024: Stage I (cT2, cN0, cM0) - Signed by Raymond Siad, MD on 06/26/2024 Stage prefix: Initial diagnosis Total positive nodes: 0   06/23/2024 Procedure   IR guided port placement   07/27/2024 -  Chemotherapy   Patient is on Treatment Plan : GASTROESOPHAGEAL FLOT q14d + Imfinzi  1500 mg q 28 days     Renal cell carcinoma (HCC)  03/30/2019 Cancer Staging   Staging form: Kidney, AJCC 8th Edition - Clinical stage from 03/30/2019: Stage I (cT1a, cN0, cM0) - Signed by Raymond Siad, MD on 05/03/2024 Stage prefix: Initial diagnosis   05/03/2024 Initial Diagnosis   Renal cell carcinoma (HCC)      Current Treatment: Neoadjuvant chemotherapy with D-FLOT  INTERVAL HISTORY:  Raymond Barrera. is here today for follow up. Patient is accompanied by his wife and mother today. He notes a normal appetite and appears well today. Raymond Barrera is currently smoking 1-2 cigarettes a day.  He is overall doing well and has no other complaints.  I have reviewed the past medical history, past surgical history,  social history and family history with the patient and they are unchanged from previous note.  ALLERGIES:  is allergic to poison ivy extract [poison ivy extract] and coreg  [carvedilol ].  MEDICATIONS:  Current Outpatient Medications  Medication Sig Dispense Refill   Accu-Chek Softclix Lancets lancets SMARTSIG:Topical 2-3 Times Daily     acetaminophen  (TYLENOL ) 325 MG tablet Take 2 tablets (650 mg total) by mouth every 6 (six) hours as needed for mild pain (pain score 1-3) (or Fever >/= 101).     albuterol  (VENTOLIN  HFA) 108 (90 Base) MCG/ACT inhaler Inhale 2 puffs into the lungs every 6 (six) hours as needed for wheezing or shortness of breath (cough). 8 g 2   aspirin  EC 81 MG tablet Take 1 tablet (81 mg total) by mouth daily with breakfast. Swallow whole. 30 tablet 12   atorvastatin  (LIPITOR) 40 MG tablet Take 1 tablet (40 mg total) by mouth daily. 90 tablet 3   Camphor-Menthol -Methyl Sal (SALONPAS EX) Apply topically. As needed     cyanocobalamin  (VITAMIN B12) 1000 MCG tablet Take 1,000 mcg by mouth daily.     dexamethasone  (DECADRON ) 4 MG tablet Take 2 tablets (8 mg total) by mouth daily. Start the day after chemotherapy for 2 days. Take with food. 30 tablet 1   dextrose  5 % SOLN 1,000 mL with fluorouracil  5 GM/100ML SOLN Inject into the vein every 14 (fourteen) days.     DOCETAXEL  IV Inject into the vein every 14 (  fourteen) days.     DULoxetine  (CYMBALTA ) 30 MG capsule Take 1 capsule (30 mg total) by mouth 2 (two) times daily. 60 capsule 3   Durvalumab  (IMFINZI  IV) Inject into the vein every 28 (twenty-eight) days.     ferrous sulfate  325 (65 FE) MG EC tablet Take 1 tablet (325 mg total) by mouth every Tuesday, Thursday, Saturday, and Sunday. 45 tablet 3   gabapentin  (NEURONTIN ) 300 MG capsule Take 1 capsule (300 mg total) by mouth at bedtime. 90 capsule 1   hydrOXYzine  (ATARAX ) 25 MG tablet Take 1 tablet (25 mg total) by mouth 3 (three) times daily as needed. 30 tablet 0   JARDIANCE  10 MG  TABS tablet Take 1 tablet (10 mg total) by mouth daily. 30 tablet 5   LEUCOVORIN  CALCIUM  IV Inject into the vein every 14 (fourteen) days.     lidocaine -prilocaine  (EMLA ) cream Apply to affected area once 30 g 3   ondansetron  (ZOFRAN ) 8 MG tablet Take 1 tablet (8 mg total) by mouth every 8 (eight) hours as needed for nausea or vomiting. Start on the third day after chemotherapy. 30 tablet 1   OXALIPLATIN  IV Inject into the vein every 14 (fourteen) days.     pantoprazole  (PROTONIX ) 40 MG tablet TAKE 1 TABLET(40 MG) BY MOUTH TWICE DAILY 60 tablet 2   polyethylene glycol powder (GLYCOLAX /MIRALAX ) 17 GM/SCOOP powder Take 8.5 g by mouth daily.     prochlorperazine  (COMPAZINE ) 10 MG tablet Take 1 tablet (10 mg total) by mouth every 6 (six) hours as needed for nausea or vomiting. 30 tablet 1   rOPINIRole  (REQUIP ) 2 MG tablet Take 1 tablet (2 mg total) by mouth at bedtime. 30 tablet 5   umeclidinium-vilanterol (ANORO ELLIPTA ) 62.5-25 MCG/ACT AEPB Inhale 1 puff into the lungs daily. 60 each 5   No current facility-administered medications for this visit.    REVIEW OF SYSTEMS:   Constitutional: Denies fevers, chills or abnormal weight loss Eyes: Denies blurriness of vision Ears, nose, mouth, throat, and face: Denies mucositis or sore throat Respiratory: Denies cough, dyspnea or wheezes Cardiovascular: Denies palpitation, chest discomfort or lower extremity swelling Gastrointestinal:  Denies nausea, heartburn or change in bowel habits Skin: Denies abnormal skin rashes Lymphatics: Denies new lymphadenopathy or easy bruising Neurological: Positive for dizziness, headaches, and numbness in hands, Denies numbness, tingling or new weaknesses Behavioral/Psych: Positive for depression All other systems were reviewed with the patient and are negative.   VITALS:  There were no vitals taken for this visit.  Wt Readings from Last 3 Encounters:  08/05/24 235 lb (106.6 kg)  08/03/24 230 lb 3.2 oz (104.4 kg)   07/27/24 240 lb 12.8 oz (109.2 kg)    There is no height or weight on file to calculate BMI.  Performance status (ECOG): 1 - Symptomatic but completely ambulatory  PHYSICAL EXAM:   GENERAL:alert, no distress and comfortable SKIN: skin color, texture, turgor are normal, no rashes or significant lesions LYMPH:  no palpable lymphadenopathy in the cervical, axillary or inguinal LUNGS: clear to auscultation and percussion with normal breathing effort HEART: regular rate & rhythm and no murmurs and no lower extremity edema ABDOMEN:abdomen soft, non-tender and normal bowel sounds Musculoskeletal:no cyanosis of digits and no clubbing  NEURO: alert & oriented x 3 with fluent speech  LABORATORY DATA:  I have reviewed the data as listed    Component Value Date/Time   NA 139 07/27/2024 0800   NA 139 01/26/2024 1620   K 4.3 07/27/2024 0800  CL 104 07/27/2024 0800   CO2 25 07/27/2024 0800   GLUCOSE 186 (H) 07/27/2024 0800   BUN 36 (H) 07/27/2024 0800   BUN 28 (H) 01/26/2024 1620   CREATININE 1.82 (H) 07/27/2024 0800   CALCIUM  11.0 (H) 07/27/2024 0800   PROT 7.1 07/27/2024 0800   PROT 7.2 01/01/2024 1012   ALBUMIN  3.6 07/27/2024 0800   ALBUMIN  4.4 01/01/2024 1012   AST 20 07/27/2024 0800   ALT 21 07/27/2024 0800   ALKPHOS 99 07/27/2024 0800   BILITOT 0.4 07/27/2024 0800   BILITOT <0.2 01/01/2024 1012   GFRNONAA 41 (L) 07/27/2024 0800   GFRAA 40 (L) 04/12/2020 1425    Lab Results  Component Value Date   WBC 5.8 07/27/2024   NEUTROABS 4.0 07/27/2024   HGB 11.2 (L) 07/27/2024   HCT 36.0 (L) 07/27/2024   MCV 90.2 07/27/2024   PLT 212 07/27/2024      Chemistry      Component Value Date/Time   NA 139 07/27/2024 0800   NA 139 01/26/2024 1620   K 4.3 07/27/2024 0800   CL 104 07/27/2024 0800   CO2 25 07/27/2024 0800   BUN 36 (H) 07/27/2024 0800   BUN 28 (H) 01/26/2024 1620   CREATININE 1.82 (H) 07/27/2024 0800      Component Value Date/Time   CALCIUM  11.0 (H) 07/27/2024  0800   ALKPHOS 99 07/27/2024 0800   AST 20 07/27/2024 0800   ALT 21 07/27/2024 0800   BILITOT 0.4 07/27/2024 0800   BILITOT <0.2 01/01/2024 1012       RADIOGRAPHIC STUDIES: I have personally reviewed the radiological images as listed and agreed with the findings in the report.  EXAM: PET AND CT SKULL BASE TO MID THIGH 07/21/2024 11:50:47 AM   TECHNIQUE: PET imaging was acquired from the base of the skull to the mid thighs. Non-contrast enhanced computed tomography was obtained for attenuation correction and anatomic localization.   RADIOPHARMACEUTICAL: 12.99 mCi F-18 FDG Uptake time 57 minutes. Glucose level 139 mg/dl.   COMPARISON: 05/19/2024   CLINICAL HISTORY: Gastric cancer, monitor. F-18 FDG 12.34mCi, FBG 139mg /dl, Wt 760oad, Stomach/ gastric cancer restaging, diabetic, scanned 57 minutes post injection, injection site Rt hand.   FINDINGS:   HEAD AND NECK: No tracer avid lymph nodes within the soft tissues of the neck.   CHEST: No tracer avid mediastinal, hilar, or axillary lymph nodes. Unchanged scarring and volume loss within the left lower lobe. No tracer avid pulmonary nodule or mass. Right chest wall port-a-catheter with tip in the superior cavoatrial junction.   ABDOMEN AND PELVIS: There is no abnormal tracer uptake within the liver, pancreas, spleen, or adrenal glands. No tracer avid abdominal or pelvic lymph nodes. Persistent mild wall thickening involving the gastric fundus and body with SUV max 2.5, previously 3.6. No ascites or tracer avid peritoneal nodularity. Solitary right kidney.   BONES AND SOFT TISSUE: No hypermetabolic bone lesions. Postoperative changes in the lower lumbar spine. Status post bilateral hip arthroplasty.   VASCULATURE: Aortic atherosclerosis and coronary artery calcification. Aortic atherosclerotic calcification.   IMPRESSION: 1. Persistent mildwall thickening involving the gastric fundus and body with mild,  non-specific tracer uptake (SUV max 2.5, previously 3.6). 2. No hypermetabolic nodal metastasis or distant metastatic disease.   Electronically signed by: Waddell Calk MD 07/30/2024 09:02 AM EDT RP Workstation: [id number]

## 2024-08-09 NOTE — Assessment & Plan Note (Addendum)
 Iron  deficiency anemia likely secondary to bleeding from stomach cancer.  Improved with iron  supplementation. S/p 1 g Monoferric on 05/10/2024 Symptomatically feels significantly better  - Continue taking iron  pills every other day.  Use MiraLAX  for constipation

## 2024-08-09 NOTE — Assessment & Plan Note (Addendum)
 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 Has some difficulty holding his phone in his right hand  - Will have caution while administering platinum based chemotherapy. - Continue taking gabapentin  and follow-up with primary care

## 2024-08-09 NOTE — Assessment & Plan Note (Addendum)
 Chronic kidney disease likely secondary to only 1 functioning kidney Follows with Dr. Rachele

## 2024-08-10 ENCOUNTER — Inpatient Hospital Stay

## 2024-08-10 ENCOUNTER — Inpatient Hospital Stay (HOSPITAL_BASED_OUTPATIENT_CLINIC_OR_DEPARTMENT_OTHER): Admitting: Oncology

## 2024-08-10 VITALS — BP 153/95 | HR 75 | Temp 98.5°F | Resp 18

## 2024-08-10 VITALS — BP 117/82 | HR 87 | Temp 96.9°F | Resp 20 | Wt 235.2 lb

## 2024-08-10 DIAGNOSIS — C641 Malignant neoplasm of right kidney, except renal pelvis: Secondary | ICD-10-CM | POA: Diagnosis not present

## 2024-08-10 DIAGNOSIS — N1832 Chronic kidney disease, stage 3b: Secondary | ICD-10-CM

## 2024-08-10 DIAGNOSIS — Z79620 Long term (current) use of immunosuppressive biologic: Secondary | ICD-10-CM | POA: Diagnosis not present

## 2024-08-10 DIAGNOSIS — Z720 Tobacco use: Secondary | ICD-10-CM

## 2024-08-10 DIAGNOSIS — Z79633 Long term (current) use of mitotic inhibitor: Secondary | ICD-10-CM | POA: Diagnosis not present

## 2024-08-10 DIAGNOSIS — C169 Malignant neoplasm of stomach, unspecified: Secondary | ICD-10-CM | POA: Diagnosis not present

## 2024-08-10 DIAGNOSIS — G6289 Other specified polyneuropathies: Secondary | ICD-10-CM

## 2024-08-10 DIAGNOSIS — Z79631 Long term (current) use of antimetabolite agent: Secondary | ICD-10-CM | POA: Diagnosis not present

## 2024-08-10 DIAGNOSIS — Z79630 Long term (current) use of alkylating agent: Secondary | ICD-10-CM | POA: Diagnosis not present

## 2024-08-10 DIAGNOSIS — R21 Rash and other nonspecific skin eruption: Secondary | ICD-10-CM | POA: Diagnosis not present

## 2024-08-10 DIAGNOSIS — D500 Iron deficiency anemia secondary to blood loss (chronic): Secondary | ICD-10-CM | POA: Diagnosis not present

## 2024-08-10 DIAGNOSIS — Z5111 Encounter for antineoplastic chemotherapy: Secondary | ICD-10-CM | POA: Diagnosis not present

## 2024-08-10 DIAGNOSIS — Z5189 Encounter for other specified aftercare: Secondary | ICD-10-CM | POA: Diagnosis not present

## 2024-08-10 LAB — COMPREHENSIVE METABOLIC PANEL WITH GFR
ALT: 15 U/L (ref 0–44)
AST: 14 U/L — ABNORMAL LOW (ref 15–41)
Albumin: 3.3 g/dL — ABNORMAL LOW (ref 3.5–5.0)
Alkaline Phosphatase: 103 U/L (ref 38–126)
Anion gap: 11 (ref 5–15)
BUN: 32 mg/dL — ABNORMAL HIGH (ref 8–23)
CO2: 24 mmol/L (ref 22–32)
Calcium: 10 mg/dL (ref 8.9–10.3)
Chloride: 103 mmol/L (ref 98–111)
Creatinine, Ser: 1.84 mg/dL — ABNORMAL HIGH (ref 0.61–1.24)
GFR, Estimated: 40 mL/min — ABNORMAL LOW (ref >60)
Glucose, Bld: 126 mg/dL — ABNORMAL HIGH (ref 70–99)
Potassium: 3.7 mmol/L (ref 3.5–5.1)
Sodium: 138 mmol/L (ref 135–145)
Total Bilirubin: 0.7 mg/dL (ref 0.0–1.2)
Total Protein: 6.4 g/dL — ABNORMAL LOW (ref 6.5–8.1)

## 2024-08-10 LAB — CBC WITH DIFFERENTIAL/PLATELET
Abs Immature Granulocytes: 1.1 K/uL — ABNORMAL HIGH (ref 0.00–0.07)
Basophils Absolute: 0 K/uL (ref 0.0–0.1)
Basophils Relative: 0 %
Eosinophils Absolute: 0 K/uL (ref 0.0–0.5)
Eosinophils Relative: 0 %
HCT: 37.5 % — ABNORMAL LOW (ref 39.0–52.0)
Hemoglobin: 11.9 g/dL — ABNORMAL LOW (ref 13.0–17.0)
Lymphocytes Relative: 12 %
Lymphs Abs: 2.7 K/uL (ref 0.7–4.0)
MCH: 27.9 pg (ref 26.0–34.0)
MCHC: 31.7 g/dL (ref 30.0–36.0)
MCV: 88 fL (ref 80.0–100.0)
Metamyelocytes Relative: 5 %
Monocytes Absolute: 0.9 K/uL (ref 0.1–1.0)
Monocytes Relative: 4 %
Neutro Abs: 18 K/uL — ABNORMAL HIGH (ref 1.7–7.7)
Neutrophils Relative %: 79 %
Platelets: 179 K/uL (ref 150–400)
RBC: 4.26 MIL/uL (ref 4.22–5.81)
RDW: 16.3 % — ABNORMAL HIGH (ref 11.5–15.5)
Smear Review: NORMAL
WBC: 22.8 K/uL — ABNORMAL HIGH (ref 4.0–10.5)
nRBC: 0.9 % — ABNORMAL HIGH (ref 0.0–0.2)

## 2024-08-10 LAB — MAGNESIUM: Magnesium: 1.9 mg/dL (ref 1.7–2.4)

## 2024-08-10 MED ORDER — DEXAMETHASONE SODIUM PHOSPHATE 10 MG/ML IJ SOLN
10.0000 mg | Freq: Once | INTRAMUSCULAR | Status: AC
  Administered 2024-08-10: 10 mg via INTRAVENOUS
  Filled 2024-08-10: qty 1

## 2024-08-10 MED ORDER — SODIUM CHLORIDE 0.9 % IV SOLN
50.0000 mg/m2 | Freq: Once | INTRAVENOUS | Status: AC
  Administered 2024-08-10: 122 mg via INTRAVENOUS
  Filled 2024-08-10: qty 12.2

## 2024-08-10 MED ORDER — OXALIPLATIN CHEMO INJECTION 100 MG/20ML
85.0000 mg/m2 | Freq: Once | INTRAVENOUS | Status: AC
  Administered 2024-08-10: 200 mg via INTRAVENOUS
  Filled 2024-08-10: qty 40

## 2024-08-10 MED ORDER — LEUCOVORIN CALCIUM INJECTION 350 MG
200.0000 mg/m2 | Freq: Once | INTRAVENOUS | Status: AC
  Administered 2024-08-10: 488 mg via INTRAVENOUS
  Filled 2024-08-10: qty 24.4

## 2024-08-10 MED ORDER — TRIAMCINOLONE ACETONIDE 0.1 % EX CREA: 1.0000 | TOPICAL_CREAM | Freq: Two times a day (BID) | CUTANEOUS | 0 refills | Status: AC

## 2024-08-10 MED ORDER — PALONOSETRON HCL INJECTION 0.25 MG/5ML
0.2500 mg | Freq: Once | INTRAVENOUS | Status: AC
  Administered 2024-08-10: 0.25 mg via INTRAVENOUS
  Filled 2024-08-10: qty 5

## 2024-08-10 MED ORDER — SODIUM CHLORIDE 0.9 % IV SOLN
2600.0000 mg/m2 | INTRAVENOUS | Status: DC
  Administered 2024-08-10: 7000 mg via INTRAVENOUS
  Filled 2024-08-10: qty 140

## 2024-08-10 MED ORDER — DEXTROSE 5 % IV SOLN: INTRAVENOUS | Status: DC

## 2024-08-10 NOTE — Patient Instructions (Signed)
 [facility] [place] - A DEPT OF [facility]  Discharge Instructions: Thank you for choosing [facility] to provide your oncology and hematology care.  If you have a lab appointment with the Cancer Center - please note that after April 8th, 2024, all labs will be drawn in the cancer center.  You do not have to check in or register with the main entrance as you have in the past but will complete your check-in in the cancer center.  Wear comfortable clothing and clothing appropriate for easy access to any Portacath or PICC line.   We strive to give you quality time with your provider. You may need to reschedule your appointment if you arrive late (15 or more minutes).  Arriving late affects you and other patients whose appointments are after yours.  Also, if you miss three or more appointments without notifying the office, you may be dismissed from the clinic at the provider's discretion.      For prescription refill requests, have your pharmacy contact our office and allow 72 hours for refills to be completed.    Today you received the following chemotherapy and/or immunotherapy agents FLOT, return as scheduled.    To help prevent nausea and vomiting after your treatment, we encourage you to take your nausea medication as directed.  BELOW ARE SYMPTOMS THAT SHOULD BE REPORTED IMMEDIATELY: *FEVER GREATER THAN 100.4 F (38 C) OR HIGHER *CHILLS OR SWEATING *NAUSEA AND VOMITING THAT IS NOT CONTROLLED WITH YOUR NAUSEA MEDICATION *UNUSUAL SHORTNESS OF BREATH *UNUSUAL BRUISING OR BLEEDING *URINARY PROBLEMS (pain or burning when urinating, or frequent urination) *BOWEL PROBLEMS (unusual diarrhea, constipation, pain near the anus) TENDERNESS IN MOUTH AND THROAT WITH OR WITHOUT PRESENCE OF ULCERS (sore throat, sores in mouth, or a toothache) UNUSUAL RASH, SWELLING OR PAIN  UNUSUAL VAGINAL DISCHARGE OR ITCHING   Items with * indicate a potential emergency and should  be followed up as soon as possible or go to the Emergency Department if any problems should occur.  Please show the CHEMOTHERAPY ALERT CARD or IMMUNOTHERAPY ALERT CARD at check-in to the Emergency Department and triage nurse.  Should you have questions after your visit or need to cancel or reschedule your appointment, please contact [facility] [place] - A DEPT OF [facility] [phone]  and follow the prompts.  Office hours are 8:00 a.m. to 4:30 p.m. Monday - Friday. Please note that voicemails left after 4:00 p.m. may not be returned until the following business day.  We are closed weekends and major holidays. You have access to a nurse at all times for urgent questions. Please call the main number to the clinic [phone] and follow the prompts.  For any non-urgent questions, you may also contact your provider using MyChart. We now offer e-Visits for anyone 41 and older to request care online for non-urgent symptoms. For details visit [email or web address].   Also download the MyChart app! Go to the app store, search "MyChart", open the app, select [place], and log in with your MyChart username and password.

## 2024-08-10 NOTE — Progress Notes (Signed)
 Patient has been examined by Dr. Davonna. Vital signs and labs have been reviewed by MD - ANC, Creatinine (1.84), LFTs, hemoglobin, and platelets have been reviewed by M.D. - pt may proceed with treatment.  Primary RN and pharmacy notified.

## 2024-08-10 NOTE — Progress Notes (Signed)
 Continue dose of 5FU pump at 7000 mg.  Dose at ~10%  Niels Molt, PharmD

## 2024-08-10 NOTE — Patient Instructions (Signed)

## 2024-08-10 NOTE — Progress Notes (Signed)
 Patient okay for treatment today per Dr. Davonna. Patient tolerated chemotherapy with no complaints voiced. Side effects with management reviewed understanding verbalized. Port site clean and dry with no bruising or swelling noted at site. Good blood return noted before and after administration of chemotherapy. Chemo pump connected with no alarms noted. Patient left in satisfactory condition with VSS and no s/s of distress noted.

## 2024-08-10 NOTE — Assessment & Plan Note (Addendum)
 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

## 2024-08-11 ENCOUNTER — Inpatient Hospital Stay

## 2024-08-11 DIAGNOSIS — Z79630 Long term (current) use of alkylating agent: Secondary | ICD-10-CM | POA: Diagnosis not present

## 2024-08-11 DIAGNOSIS — Z79631 Long term (current) use of antimetabolite agent: Secondary | ICD-10-CM | POA: Diagnosis not present

## 2024-08-11 DIAGNOSIS — Z79633 Long term (current) use of mitotic inhibitor: Secondary | ICD-10-CM | POA: Diagnosis not present

## 2024-08-11 DIAGNOSIS — C169 Malignant neoplasm of stomach, unspecified: Secondary | ICD-10-CM | POA: Diagnosis not present

## 2024-08-11 DIAGNOSIS — Z5111 Encounter for antineoplastic chemotherapy: Secondary | ICD-10-CM | POA: Diagnosis not present

## 2024-08-11 DIAGNOSIS — Z79620 Long term (current) use of immunosuppressive biologic: Secondary | ICD-10-CM | POA: Diagnosis not present

## 2024-08-11 DIAGNOSIS — Z5189 Encounter for other specified aftercare: Secondary | ICD-10-CM | POA: Diagnosis not present

## 2024-08-11 MED ORDER — PEGFILGRASTIM-CBQV 6 MG/0.6ML ~~LOC~~ SOSY
6.0000 mg | PREFILLED_SYRINGE | Freq: Once | SUBCUTANEOUS | Status: AC
  Administered 2024-08-11: 6 mg via SUBCUTANEOUS

## 2024-08-11 NOTE — Patient Instructions (Signed)
 [facility] [place] - A DEPT OF [place]  Discharge Instructions: Thank you for choosing [facility] to provide your oncology and hematology care.  If you have a lab appointment with the Cancer Center - please note that after April 8th, 2024, all labs will be drawn in the cancer center.  You do not have to check in or register with the main entrance as you have in the past but will complete your check-in in the cancer center.  Wear comfortable clothing and clothing appropriate for easy access to any Portacath or PICC line.   We strive to give you quality time with your provider. You may need to reschedule your appointment if you arrive late (15 or more minutes).  Arriving late affects you and other patients whose appointments are after yours.  Also, if you miss three or more appointments without notifying the office, you may be dismissed from the clinic at the provider's discretion.      For prescription refill requests, have your pharmacy contact our office and allow 72 hours for refills to be completed.    Today you received the following chemotherapy and/or immunotherapy agents Pump stop Udenyca       To help prevent nausea and vomiting after your treatment, we encourage you to take your nausea medication as directed.  BELOW ARE SYMPTOMS THAT SHOULD BE REPORTED IMMEDIATELY: *FEVER GREATER THAN 100.4 F (38 C) OR HIGHER *CHILLS OR SWEATING *NAUSEA AND VOMITING THAT IS NOT CONTROLLED WITH YOUR NAUSEA MEDICATION *UNUSUAL SHORTNESS OF BREATH *UNUSUAL BRUISING OR BLEEDING *URINARY PROBLEMS (pain or burning when urinating, or frequent urination) *BOWEL PROBLEMS (unusual diarrhea, constipation, pain near the anus) TENDERNESS IN MOUTH AND THROAT WITH OR WITHOUT PRESENCE OF ULCERS (sore throat, sores in mouth, or a toothache) UNUSUAL RASH, SWELLING OR PAIN  UNUSUAL VAGINAL DISCHARGE OR ITCHING   Items with * indicate a potential emergency and should be  followed up as soon as possible or go to the Emergency Department if any problems should occur.  Please show the CHEMOTHERAPY ALERT CARD or IMMUNOTHERAPY ALERT CARD at check-in to the Emergency Department and triage nurse.  Should you have questions after your visit or need to cancel or reschedule your appointment, please contact [facility] [place] - A DEPT OF [name] [phone]  and follow the prompts.  Office hours are 8:00 a.m. to 4:30 p.m. Monday - Friday. Please note that voicemails left after 4:00 p.m. may not be returned until the following business day.  We are closed weekends and major holidays. You have access to a nurse at all times for urgent questions. Please call the main number to the clinic [phone] and follow the prompts.  For any non-urgent questions, you may also contact your provider using MyChart. We now offer e-Visits for anyone 73 and older to request care online for non-urgent symptoms. For details visit [email or web address].   Also download the MyChart app! Go to the app store, search MyChart, open the app, select [place], and log in with your MyChart username and password.

## 2024-08-11 NOTE — Progress Notes (Signed)
 Patient presents today for 5FU pump stop and disconnection after 46 hour continous infusion.   5FU pump deaccessed.  Patients port flushed without difficulty.  Good blood return noted with no bruising or swelling noted at site.  Needle removed intact.  Band aid applied.  VSS with discharge and left in satisfactory condition via wheelchair with no s/s of distress noted.     Stable during Udenyca  administration without incident; injection site WNL; see MAR for injection details.  Patient tolerated procedure well and without incident.  No questions or complaints noted at this time.

## 2024-08-12 ENCOUNTER — Encounter

## 2024-08-16 ENCOUNTER — Other Ambulatory Visit: Payer: Self-pay | Admitting: Oncology

## 2024-08-17 ENCOUNTER — Other Ambulatory Visit: Payer: Self-pay | Admitting: *Deleted

## 2024-08-17 ENCOUNTER — Telehealth: Payer: Self-pay | Admitting: *Deleted

## 2024-08-17 MED ORDER — DIPHENOXYLATE-ATROPINE 2.5-0.025 MG PO TABS: 2.0000 | ORAL_TABLET | Freq: Four times a day (QID) | ORAL | 0 refills | Status: AC | PRN

## 2024-08-17 NOTE — Telephone Encounter (Signed)
 Wife called to advise that he has had diarrhea for 4 days and is not eating well. Weight is down to 223 from 235. Only thing he is taking in is Ensure, which I explained can cause diarrhea. Wife said that does not appear to be dehydrated at this point. Dr. Davonna aware.  Will send in lomotil  for him.  Advised that they let us  know if it is not effective and we would bring him in for labs and fluids if needed.  Will reach out to dietician to consult with them.  Verbalized understanding.

## 2024-08-18 ENCOUNTER — Other Ambulatory Visit: Payer: Self-pay | Admitting: Internal Medicine

## 2024-08-18 DIAGNOSIS — G6289 Other specified polyneuropathies: Secondary | ICD-10-CM

## 2024-08-22 ENCOUNTER — Telehealth: Payer: Self-pay | Admitting: Dietician

## 2024-08-22 ENCOUNTER — Encounter: Admitting: Dietician

## 2024-08-22 NOTE — Telephone Encounter (Signed)
 Nutrition Follow-up:  Pt with gastric cancer. He receiving neoadjuvant D-FLOT Patient is under the care of Dr. Davonna   RD informed of diarrhea episodes after first treatment.   Spoke with wife of patient via telephone. She reports diarrhea has improved. Patient eating a bit more. He got up this morning and made a bowl of oatmeal. Patient is staying hydrated. Drinking cranberry and grape juice. Wife planning to get more gatorade today.   Wife reports patient has had poor mood. Suggestions and care are poorly received. Wife is giving patient some space and encouraged him to pray.   Medications: reviewed   Labs: glucose 126, BUN 32, Cr 1.84, albumin  3.3  Anthropometrics: Last wt 235 lb 3.2 oz on 9/10  9/5 - 235 lb  8/27 - 240 lb 12.8 oz    NUTRITION DIAGNOSIS: Food and nutrition related knowledge deficit - improving    INTERVENTION:  Discussed strategies for diarrhea, foods best tolerated and foods to avoid Include lean proteins with meals  Continue supplementing with protein shakes as tolerated  Stay hydrated, recommend cup of fluid for every diarrhea episode Support and encouragement     MONITORING, EVALUATION, GOAL: wt trends, intake    NEXT VISIT: Monday October 6 as scheduled

## 2024-08-24 ENCOUNTER — Inpatient Hospital Stay

## 2024-08-24 VITALS — BP 156/93 | HR 63 | Temp 97.8°F | Resp 16 | Wt 228.0 lb

## 2024-08-24 DIAGNOSIS — C169 Malignant neoplasm of stomach, unspecified: Secondary | ICD-10-CM

## 2024-08-24 DIAGNOSIS — Z79631 Long term (current) use of antimetabolite agent: Secondary | ICD-10-CM | POA: Diagnosis not present

## 2024-08-24 DIAGNOSIS — Z5189 Encounter for other specified aftercare: Secondary | ICD-10-CM | POA: Diagnosis not present

## 2024-08-24 DIAGNOSIS — Z79620 Long term (current) use of immunosuppressive biologic: Secondary | ICD-10-CM | POA: Diagnosis not present

## 2024-08-24 DIAGNOSIS — Z5111 Encounter for antineoplastic chemotherapy: Secondary | ICD-10-CM | POA: Diagnosis not present

## 2024-08-24 DIAGNOSIS — Z79630 Long term (current) use of alkylating agent: Secondary | ICD-10-CM | POA: Diagnosis not present

## 2024-08-24 DIAGNOSIS — Z79633 Long term (current) use of mitotic inhibitor: Secondary | ICD-10-CM | POA: Diagnosis not present

## 2024-08-24 LAB — CBC WITH DIFFERENTIAL/PLATELET
Abs Immature Granulocytes: 0.53 K/uL — ABNORMAL HIGH (ref 0.00–0.07)
Basophils Absolute: 0.1 K/uL (ref 0.0–0.1)
Basophils Relative: 1 %
Eosinophils Absolute: 0.2 K/uL (ref 0.0–0.5)
Eosinophils Relative: 1 %
HCT: 37.2 % — ABNORMAL LOW (ref 39.0–52.0)
Hemoglobin: 11.5 g/dL — ABNORMAL LOW (ref 13.0–17.0)
Immature Granulocytes: 4 %
Lymphocytes Relative: 13 %
Lymphs Abs: 1.6 K/uL (ref 0.7–4.0)
MCH: 27.5 pg (ref 26.0–34.0)
MCHC: 30.9 g/dL (ref 30.0–36.0)
MCV: 89 fL (ref 80.0–100.0)
Monocytes Absolute: 0.8 K/uL (ref 0.1–1.0)
Monocytes Relative: 7 %
Neutro Abs: 9.5 K/uL — ABNORMAL HIGH (ref 1.7–7.7)
Neutrophils Relative %: 74 %
Platelets: 253 K/uL (ref 150–400)
RBC: 4.18 MIL/uL — ABNORMAL LOW (ref 4.22–5.81)
RDW: 16.3 % — ABNORMAL HIGH (ref 11.5–15.5)
WBC: 12.7 K/uL — ABNORMAL HIGH (ref 4.0–10.5)
nRBC: 0.5 % — ABNORMAL HIGH (ref 0.0–0.2)

## 2024-08-24 LAB — COMPREHENSIVE METABOLIC PANEL WITH GFR
ALT: 19 U/L (ref 0–44)
AST: 18 U/L (ref 15–41)
Albumin: 3.3 g/dL — ABNORMAL LOW (ref 3.5–5.0)
Alkaline Phosphatase: 121 U/L (ref 38–126)
Anion gap: 11 (ref 5–15)
BUN: 18 mg/dL (ref 8–23)
CO2: 24 mmol/L (ref 22–32)
Calcium: 10.3 mg/dL (ref 8.9–10.3)
Chloride: 103 mmol/L (ref 98–111)
Creatinine, Ser: 1.93 mg/dL — ABNORMAL HIGH (ref 0.61–1.24)
GFR, Estimated: 38 mL/min — ABNORMAL LOW (ref >60)
Glucose, Bld: 147 mg/dL — ABNORMAL HIGH (ref 70–99)
Potassium: 3.7 mmol/L (ref 3.5–5.1)
Sodium: 138 mmol/L (ref 135–145)
Total Bilirubin: 0.5 mg/dL (ref 0.0–1.2)
Total Protein: 6.4 g/dL — ABNORMAL LOW (ref 6.5–8.1)

## 2024-08-24 LAB — TSH: TSH: 1.709 u[IU]/mL (ref 0.350–4.500)

## 2024-08-24 LAB — MAGNESIUM: Magnesium: 1.9 mg/dL (ref 1.7–2.4)

## 2024-08-24 MED ORDER — SODIUM CHLORIDE 0.9 % IV SOLN
6200.0000 mg | INTRAVENOUS | Status: DC
  Administered 2024-08-24: 6200 mg via INTRAVENOUS
  Filled 2024-08-24: qty 124

## 2024-08-24 MED ORDER — OXALIPLATIN CHEMO INJECTION 100 MG/20ML
85.0000 mg/m2 | Freq: Once | INTRAVENOUS | Status: AC
  Administered 2024-08-24: 200 mg via INTRAVENOUS
  Filled 2024-08-24: qty 40

## 2024-08-24 MED ORDER — DEXAMETHASONE SODIUM PHOSPHATE 10 MG/ML IJ SOLN
10.0000 mg | Freq: Once | INTRAMUSCULAR | Status: AC
  Administered 2024-08-24: 10 mg via INTRAVENOUS
  Filled 2024-08-24: qty 1

## 2024-08-24 MED ORDER — LEUCOVORIN CALCIUM INJECTION 350 MG
200.0000 mg/m2 | Freq: Once | INTRAVENOUS | Status: AC
  Administered 2024-08-24: 488 mg via INTRAVENOUS
  Filled 2024-08-24: qty 24.4

## 2024-08-24 MED ORDER — SODIUM CHLORIDE 0.9 % IV SOLN
50.0000 mg/m2 | Freq: Once | INTRAVENOUS | Status: AC
  Administered 2024-08-24: 122 mg via INTRAVENOUS
  Filled 2024-08-24: qty 12.2

## 2024-08-24 MED ORDER — PALONOSETRON HCL INJECTION 0.25 MG/5ML
0.2500 mg | Freq: Once | INTRAVENOUS | Status: AC
  Administered 2024-08-24: 0.25 mg via INTRAVENOUS
  Filled 2024-08-24: qty 5

## 2024-08-24 MED ORDER — DEXTROSE 5 % IV SOLN: INTRAVENOUS | Status: DC

## 2024-08-24 MED ORDER — SODIUM CHLORIDE 0.9 % IV SOLN
1500.0000 mg | Freq: Once | INTRAVENOUS | Status: AC
  Administered 2024-08-24: 1500 mg via INTRAVENOUS
  Filled 2024-08-24: qty 30

## 2024-08-24 NOTE — Progress Notes (Signed)
 Treatment given today per MD orders. Tolerated infusion without adverse affects. Vital signs stable. 5FU pump running and RUN noted on screen and verified with patient. No complaints at this time. Discharged from clinic ambulatory in stable condition. Alert and oriented x 3. F/U with [facility] as scheduled.

## 2024-08-24 NOTE — Patient Instructions (Signed)
 [facility] [place] - A DEPT OF [facility]  Discharge Instructions: Thank you for choosing [facility] to provide your oncology and hematology care.  If you have a lab appointment with the Cancer Center - please note that after April 8th, 2024, all labs will be drawn in the cancer center.  You do not have to check in or register with the main entrance as you have in the past but will complete your check-in in the cancer center.  Wear comfortable clothing and clothing appropriate for easy access to any Portacath or PICC line.   We strive to give you quality time with your provider. You may need to reschedule your appointment if you arrive late (15 or more minutes).  Arriving late affects you and other patients whose appointments are after yours.  Also, if you miss three or more appointments without notifying the office, you may be dismissed from the clinic at the provider's discretion.      For prescription refill requests, have your pharmacy contact our office and allow 72 hours for refills to be completed.   The chemotherapy medication bag should finish at 46 hours, 96 hours, or 7 days. For example, if your pump is scheduled for 46 hours and it was put on at 4:00 p.m., it should finish at 2:00 p.m. the day it is scheduled to come off regardless of your appointment time.     Estimated time to finish at 16:15 pm. .   If the display on your pump reads Low Volume and it is beeping, take the batteries out of the pump and come to the cancer center for it to be taken off.   If the pump alarms go off prior to the pump reading Low Volume then call [phone] and someone can assist you.  If the plunger comes out and the chemotherapy medication is leaking out, please use your home chemo spill kit to clean up the spill. Do NOT use paper towels or other household products.  If you have problems or questions regarding your pump, please call either [phone]  (24 hours a day) or the cancer center Monday-Friday 8:00 a.m.- 4:30 p.m. at the clinic number and we will assist you. If you are unable to get assistance, then go to the nearest Emergency Department and ask the staff to contact the IV team for assistance.    Today you received the following chemotherapy and/or immunotherapy agents Adrucil .        To help prevent nausea and vomiting after your treatment, we encourage you to take your nausea medication as directed.  BELOW ARE SYMPTOMS THAT SHOULD BE REPORTED IMMEDIATELY: *FEVER GREATER THAN 100.4 F (38 C) OR HIGHER *CHILLS OR SWEATING *NAUSEA AND VOMITING THAT IS NOT CONTROLLED WITH YOUR NAUSEA MEDICATION *UNUSUAL SHORTNESS OF BREATH *UNUSUAL BRUISING OR BLEEDING *URINARY PROBLEMS (pain or burning when urinating, or frequent urination) *BOWEL PROBLEMS (unusual diarrhea, constipation, pain near the anus) TENDERNESS IN MOUTH AND THROAT WITH OR WITHOUT PRESENCE OF ULCERS (sore throat, sores in mouth, or a toothache) UNUSUAL RASH, SWELLING OR PAIN  UNUSUAL VAGINAL DISCHARGE OR ITCHING   Items with * indicate a potential emergency and should be followed up as soon as possible or go to the Emergency Department if any problems should occur.  Please show the CHEMOTHERAPY ALERT CARD or IMMUNOTHERAPY ALERT CARD at check-in to the Emergency Department and triage nurse.  Should you have questions after your visit or need to cancel  or reschedule your appointment, please contact [facility] [place] - A DEPT OF [name] [phone]  and follow the prompts.  Office hours are 8:00 a.m. to 4:30 p.m. Monday - Friday. Please note that voicemails left after 4:00 p.m. may not be returned until the following business day.  We are closed weekends and major holidays. You have access to a nurse at all times for urgent questions. Please call the main number to the clinic [phone] and follow the prompts.  For any non-urgent questions,  you may also contact your provider using MyChart. We now offer e-Visits for anyone 68 and older to request care online for non-urgent symptoms. For details visit [email or web address].   Also download the MyChart app! Go to the app store, search MyChart, open the app, select [place], and log in with your MyChart username and password.

## 2024-08-24 NOTE — Progress Notes (Signed)
 Patient with weight loss.  New BSA: 2.39 m2  New dose of 5FU pump is 2600 mg/m2 = 6200 mg  Only drug affected is 5FU.  Niels Molt, PharmD

## 2024-08-24 NOTE — Progress Notes (Signed)
 Patient's labs within treatment parameters.

## 2024-08-25 ENCOUNTER — Encounter: Payer: Self-pay | Admitting: Oncology

## 2024-08-25 ENCOUNTER — Inpatient Hospital Stay

## 2024-08-25 VITALS — BP 142/95 | HR 61 | Temp 97.1°F | Resp 18

## 2024-08-25 DIAGNOSIS — Z79633 Long term (current) use of mitotic inhibitor: Secondary | ICD-10-CM | POA: Diagnosis not present

## 2024-08-25 DIAGNOSIS — Z79620 Long term (current) use of immunosuppressive biologic: Secondary | ICD-10-CM | POA: Diagnosis not present

## 2024-08-25 DIAGNOSIS — C169 Malignant neoplasm of stomach, unspecified: Secondary | ICD-10-CM

## 2024-08-25 DIAGNOSIS — Z5111 Encounter for antineoplastic chemotherapy: Secondary | ICD-10-CM | POA: Diagnosis not present

## 2024-08-25 DIAGNOSIS — Z79630 Long term (current) use of alkylating agent: Secondary | ICD-10-CM | POA: Diagnosis not present

## 2024-08-25 DIAGNOSIS — Z5189 Encounter for other specified aftercare: Secondary | ICD-10-CM | POA: Diagnosis not present

## 2024-08-25 DIAGNOSIS — Z79631 Long term (current) use of antimetabolite agent: Secondary | ICD-10-CM | POA: Diagnosis not present

## 2024-08-25 LAB — T4: T4, Total: 8.6 ug/dL (ref 4.5–12.0)

## 2024-08-25 MED ORDER — PEGFILGRASTIM-CBQV 6 MG/0.6ML ~~LOC~~ SOSY
6.0000 mg | PREFILLED_SYRINGE | Freq: Once | SUBCUTANEOUS | Status: AC
  Administered 2024-08-25: 6 mg via SUBCUTANEOUS

## 2024-08-25 NOTE — Progress Notes (Signed)
 Raymond Barrera. presents today for injection per the provider's orders.  Udenyca  administration without incident; injection site WNL; see MAR for injection details.  Patient tolerated procedure well and without incident.  No questions or complaints noted at this time.

## 2024-08-25 NOTE — Progress Notes (Signed)
 Raymond Barrera. presents to have home infusion pump d/c'd and for port-a-cath deaccess with flush.  Portacath located right chest wall accessed with  H 20 needle.  Good blood return present. Portacath flushed with NS and needle removed intact.  Procedure tolerated well and without incident.    Discharged home in stable condition with cane and no complaints noted at this time

## 2024-08-25 NOTE — Patient Instructions (Signed)
 [facility] [place] - A DEPT OF [place]  Discharge Instructions: Thank you for choosing [facility] to provide your oncology and hematology care.  If you have a lab appointment with the Cancer Center - please note that after April 8th, 2024, all labs will be drawn in the cancer center.  You do not have to check in or register with the main entrance as you have in the past but will complete your check-in in the cancer center.  Wear comfortable clothing and clothing appropriate for easy access to any Portacath or PICC line.   We strive to give you quality time with your provider. You may need to reschedule your appointment if you arrive late (15 or more minutes).  Arriving late affects you and other patients whose appointments are after yours.  Also, if you miss three or more appointments without notifying the office, you may be dismissed from the clinic at the provider's discretion.      For prescription refill requests, have your pharmacy contact our office and allow 72 hours for refills to be completed.    Today you received the following chemotherapy and/or immunotherapy agents udenyca / pump d/c      To help prevent nausea and vomiting after your treatment, we encourage you to take your nausea medication as directed.  BELOW ARE SYMPTOMS THAT SHOULD BE REPORTED IMMEDIATELY: *FEVER GREATER THAN 100.4 F (38 C) OR HIGHER *CHILLS OR SWEATING *NAUSEA AND VOMITING THAT IS NOT CONTROLLED WITH YOUR NAUSEA MEDICATION *UNUSUAL SHORTNESS OF BREATH *UNUSUAL BRUISING OR BLEEDING *URINARY PROBLEMS (pain or burning when urinating, or frequent urination) *BOWEL PROBLEMS (unusual diarrhea, constipation, pain near the anus) TENDERNESS IN MOUTH AND THROAT WITH OR WITHOUT PRESENCE OF ULCERS (sore throat, sores in mouth, or a toothache) UNUSUAL RASH, SWELLING OR PAIN  UNUSUAL VAGINAL DISCHARGE OR ITCHING   Items with * indicate a potential emergency and should be  followed up as soon as possible or go to the Emergency Department if any problems should occur.  Please show the CHEMOTHERAPY ALERT CARD or IMMUNOTHERAPY ALERT CARD at check-in to the Emergency Department and triage nurse.  Should you have questions after your visit or need to cancel or reschedule your appointment, please contact [facility] [place] - A DEPT OF [name] [phone]  and follow the prompts.  Office hours are 8:00 a.m. to 4:30 p.m. Monday - Friday. Please note that voicemails left after 4:00 p.m. may not be returned until the following business day.  We are closed weekends and major holidays. You have access to a nurse at all times for urgent questions. Please call the main number to the clinic [phone] and follow the prompts.  For any non-urgent questions, you may also contact your provider using MyChart. We now offer e-Visits for anyone 7 and older to request care online for non-urgent symptoms. For details visit [email or web address].   Also download the MyChart app! Go to the app store, search MyChart, open the app, select [place], and log in with your MyChart username and password.

## 2024-08-27 DIAGNOSIS — C169 Malignant neoplasm of stomach, unspecified: Secondary | ICD-10-CM | POA: Diagnosis not present

## 2024-08-31 ENCOUNTER — Other Ambulatory Visit: Payer: Self-pay | Admitting: Oncology

## 2024-08-31 DIAGNOSIS — C169 Malignant neoplasm of stomach, unspecified: Secondary | ICD-10-CM

## 2024-09-05 ENCOUNTER — Ambulatory Visit: Payer: Self-pay

## 2024-09-05 ENCOUNTER — Inpatient Hospital Stay: Admitting: Dietician

## 2024-09-05 ENCOUNTER — Telehealth: Payer: Self-pay | Admitting: Dietician

## 2024-09-05 NOTE — Telephone Encounter (Signed)
 Patient with hx of cancer called with concerns of bilateral heel pain along with redness, warmth to the touch and sensitivity. Scheduled for an appointment tomorrow at 2:30 PM with [facility] provider.   FYI Only or Action Required?: FYI only for provider.  Patient was last seen in primary care on 08/03/2024 by [name], MD.  Called Nurse Triage reporting Foot Pain.  Symptoms began several days ago.  Interventions attempted: Rest, hydration, or home remedies.  Symptoms are: unchanged.  Triage Disposition: See Physician Within 24 Hours  Patient/caregiver understands and will follow disposition?: Yes  Copied from CRM #[id number]. Topic: Clinical - Red Word Triage >> Sep 05, 2024 11:06 AM Raymond Barrera wrote: Red Word that prompted transfer to Nurse Triage: Raymond Barrera, the patient's wife, called to report that the patient is experiencing sensitivity and pain in both heels, making it difficult for him to walk. He has tried green alcohol and lidocaine  for relief but continues to have persistent pain. Reason for Disposition  [1] Redness of the skin AND [2] no fever  Answer Assessment - Initial Assessment Questions 1. ONSET: When did the pain start?      Started a couple of days ago 2. LOCATION: Where is the pain located?      Bilateral heel pain 3. PAIN: How bad is the pain?    (Scale 1-10; or mild, moderate, severe)     9 out of 10 4. WORK OR EXERCISE: Has there been any recent work or exercise that involved this part of the body?      no 5. CAUSE: What do you think is causing the foot pain?     unsure 6. OTHER SYMPTOMS: Do you have any other symptoms? (e.g., leg pain, rash, fever, numbness)     Warm to the touch, sensitive to touch  Protocols used: Foot Pain-A-AH

## 2024-09-05 NOTE — Telephone Encounter (Signed)
 Call placed to patient wife for nutrition follow-up as scheduled. Left VM with request for return call. Contact information provided.

## 2024-09-05 NOTE — Telephone Encounter (Signed)
Noted patient scheduled

## 2024-09-06 ENCOUNTER — Ambulatory Visit: Admitting: Family Medicine

## 2024-09-07 ENCOUNTER — Inpatient Hospital Stay

## 2024-09-07 ENCOUNTER — Ambulatory Visit: Admitting: Oncology

## 2024-09-07 ENCOUNTER — Ambulatory Visit

## 2024-09-08 ENCOUNTER — Inpatient Hospital Stay

## 2024-09-09 ENCOUNTER — Telehealth (INDEPENDENT_AMBULATORY_CARE_PROVIDER_SITE_OTHER): Admitting: Primary Care

## 2024-09-09 ENCOUNTER — Telehealth: Payer: Self-pay | Admitting: Primary Care

## 2024-09-09 DIAGNOSIS — G4761 Periodic limb movement disorder: Secondary | ICD-10-CM | POA: Diagnosis not present

## 2024-09-09 DIAGNOSIS — J449 Chronic obstructive pulmonary disease, unspecified: Secondary | ICD-10-CM

## 2024-09-09 DIAGNOSIS — G4730 Sleep apnea, unspecified: Secondary | ICD-10-CM | POA: Diagnosis not present

## 2024-09-09 MED ORDER — ROPINIROLE HCL 2 MG PO TABS: 2.0000 mg | ORAL_TABLET | Freq: Every day | ORAL | 5 refills | Status: AC

## 2024-09-09 MED ORDER — UMECLIDINIUM-VILANTEROL 62.5-25 MCG/ACT IN AEPB: 1.0000 | INHALATION_SPRAY | Freq: Every day | RESPIRATORY_TRACT | 5 refills | Status: AC

## 2024-09-09 NOTE — Progress Notes (Signed)
 Virtual Visit via Video Note  I connected with Raymond Barrera. on 09/09/24 at  2:00 PM EDT by a video enabled telemedicine application and verified that I am speaking with the correct person using two identifiers.  Location: Patient: Home Provider: Office    I discussed the limitations of evaluation and management by telemedicine and the availability of in person appointments. The patient expressed understanding and agreed to proceed.  History of Present Illness: 63 year old male, current smoker. PMH significant for chronic systolic heart failure, CVA, HTN, diabetes, hyperlipidemia, hx renal cell carcinoma.    Previous [facility] pulmonary encounter:  03/03/2023 Patient presents today for sleep consults. Patient had a CVA in February 2024. He has associated right sided weakness and speech impairment. He has symptoms of loud snoring and witnessed apnea. Wife is concerned. He feels his sleep is restless. He will wake himself up at times snoring. Typical bedtime is between 12am-1am. His sleep schedule really can vary. It can take him hours to fall asleep. He wakes up several times a night. At times he starts his day as early as 3am. His father had sleep apnea and wore CPAP.   He is a current smoker. He smokes on average 3 cigarettes a day. He has some baseline shortness of breath and chronic cough. No pulmonary function testing on file. He ambulates with cane. He attends physical therapy three days a week. No oxygen  use.   Sleep questionnaire Symptoms-  loud snoring, witnessed apnea, restless sleep  Prior sleep study- none Bedtime- 12am-1am Time to fall asleep- typically a long time/ varies  Nocturnal awakenings- several times  Out of bed/start of day- some days he is up at 3am Weight changes- no Do you operate heavy machinery- no Do you currently wear CPAP- no Do you current wear oxygen - no Epworth- 18   05/28/2023 Patient presents today to review sleep study results and PFTs.  Patient had  split-night sleep study on 04/08/2023 that showed mild obstructive sleep apnea, AHI 5.5 an hour with SpO2 low 83%.  He had severe periodic limb movement during sleep study with associated arousals. His wife tells me that his legs are constantly moving before going to sleep and leg movement wakes him up at night.   He has daily dyspnea symptoms and chronic cough. Current someday smoker. He is not currently on BD regimen.    07/14/2023 Patient presents today for CPAP compliance.  Split-night sleep study on 04/08/2023 showed mild obstructive sleep apnea, AHI 5.5 an hour with SpO2 low 83%.  Patient had severe periodic limb movement during sleep study with associated arousals.  Mild severity of his OSA CPAP not necessary at this time.  During her last office visit he was started on Requip  for restless leg symptoms. Was also started patient Anoro Ellipta  for moderate COPD.  He is sleeping through the night better since starting Requip . Per is his he wakes up at 2am because he is hungry. ANORO has been helping his breathing. He has a slight cough currently due to head cold. He has cut back on smoking, down to cigarettes a day.  They are having a hard time affording several of his medications due to finance issues. Changing PCP, needs refill of Plavix , Jardiance  and gabapentin  prescriptions.     04/21/2024 Discussed the use of AI scribe software for clinical note transcription with the patient, who gave verbal consent to proceed.  History of Present Illness   Raymond Barrera is a 63  year old male with COPD, RLS and sleep apnea who presents for a routine follow-up. He is accompanied by his partner.  He has COPD managed with Anora once daily, which he finds effective. He smokes one to two cigarettes daily, influenced by stress levels. He experiences occasional dyspnea, particularly with exertion, but has no cough, sputum production, wheezing, or chest tightness.  He has mild sleep apnea, confirmed by  a sleep study last May showing 5.5 apneic events per hour. He experiences occasional snoring and apneic episodes during sleep, requiring his partner to wake him. He has not used CPAP therapy.  His restless leg syndrome symptoms have worsened, particularly at night, causing significant disruption. He is on gabapentin  300 mg, reduced from 400 mg due to dizziness, forgetfulness, and irritability. Despite this, gabapentin  and Tylenol  are not effectively managing his pain, leading to increased irritability and discomfort.  He has a recent diagnosis of a cancerous ulcer, managed by Dr. Cinderella. No additional pain management has been provided beyond gabapentin  and Tylenol .     09/09/2024- Interim hx  Discussed the use of AI scribe software for clinical note transcription with the patient, who gave verbal consent to proceed.  History of Present Illness Raymond Barrera is a 63 year old male with sleep apnea who presents for a follow-up regarding CPAP use. He is accompanied by his spouse.  He was started on CPAP therapy in July 2025 but has been experiencing difficulties with its use, only managing to use it about nine days in the past month due to discomfort and a sensation of not being able to breathe. Despite previous adjustments to the nasal pressure, he continues to have issues with the mask and pressure settings. His sleep study from May 2024 indicated very mild sleep apnea with 5.5 events per hour.  He has experienced significant weight loss, approximately 40 pounds over the past year, due to a gastric ulcer, which has affected his nutrition and eating habits. He reports sleeping better without the CPAP.  He is managing periodic limb movement disorder and is currently taking Requip  at a dose of 2 mg, which he finds more effective than the previous 1 mg dose. He is also on gabapentin . He occasionally experiences dizziness, which may be related to his medication regimen.  For his COPD, he is  using Anoro Ellipta  and reports significant improvement in his symptoms. He has reduced his smoking to about half a cigarette per day and has not experienced any recent respiratory flare-ups. No acute respiratory symptoms or recent flare-ups of COPD.         Observations/Objective:    Pulmonary function testing 03/10/23 >> FVC 3.24 (51%), FEV1 2.10 (44%), ratio 65, DLCO 23.71 (67%) Moderate obstructive airway disease, moderate diffusion defect  Assessment and Plan:  Assessment and Plan Assessment & Plan Obstructive sleep apnea, mild Mild obstructive sleep apnea with CPAP intolerance. Recent weight loss of 40 pounds likely contributing to improved symptoms. Sleep study from May 2024 showed very mild apnea with 5.5 events per hour. CPAP may not be necessary due to mild nature and weight loss. - Change CPAP pressure auto settings 5-10cm h20 and ramp time settings to - Increased EPR settings level 3 - Advised patient try CPAP again over the weekend - If CPAP remains intolerable, send a message via MyChart to discontinue CPAP  Chronic obstructive pulmonary disease (COPD) COPD well-managed with Anoro Ellipta . Significant reduction in smoking to about half a cigarette per day. No recent exacerbations  or acute respiratory symptoms reported. - Ensure refills for Anoro Ellipta    Periodic limb movement disorder Periodic limb movement disorder managed with ropinirole  2 mg and gabapentin . No significant interaction between medications, but potential for increased side effects such as drowsiness, dizziness, or impaired coordination. Occasional dizziness reported, but no significant cognitive issues or falls. - Continue ropinirole  2 mg - Ensure refills for ropinirole   Follow Up Instructions:  6 month fu with Beth NP or sooner if needed    I discussed the assessment and treatment plan with the patient. The patient was provided an opportunity to ask questions and all were answered. The  patient agreed with the plan and demonstrated an understanding of the instructions.   The patient was advised to call back or seek an in-person evaluation if the symptoms worsen or if the condition fails to improve as anticipated.  I provided 22 minutes of non-face-to-face time during this encounter.   Almarie LELON Ferrari, NP

## 2024-09-09 NOTE — Telephone Encounter (Signed)
 Please schedule FU visit in 6 months with [facility] NP or sooner if needed

## 2024-09-12 ENCOUNTER — Ambulatory Visit: Payer: Self-pay

## 2024-09-12 ENCOUNTER — Inpatient Hospital Stay (HOSPITAL_BASED_OUTPATIENT_CLINIC_OR_DEPARTMENT_OTHER): Admitting: Oncology

## 2024-09-12 ENCOUNTER — Inpatient Hospital Stay

## 2024-09-12 ENCOUNTER — Inpatient Hospital Stay: Attending: Oncology | Admitting: Dietician

## 2024-09-12 ENCOUNTER — Encounter: Payer: Self-pay | Admitting: Oncology

## 2024-09-12 VITALS — BP 117/85 | HR 98 | Temp 96.6°F | Resp 20 | Wt 215.0 lb

## 2024-09-12 VITALS — BP 156/106 | HR 83 | Temp 98.0°F | Resp 18

## 2024-09-12 DIAGNOSIS — Z79620 Long term (current) use of immunosuppressive biologic: Secondary | ICD-10-CM | POA: Insufficient documentation

## 2024-09-12 DIAGNOSIS — Z79899 Other long term (current) drug therapy: Secondary | ICD-10-CM | POA: Insufficient documentation

## 2024-09-12 DIAGNOSIS — Z79631 Long term (current) use of antimetabolite agent: Secondary | ICD-10-CM | POA: Insufficient documentation

## 2024-09-12 DIAGNOSIS — C169 Malignant neoplasm of stomach, unspecified: Secondary | ICD-10-CM

## 2024-09-12 DIAGNOSIS — Z79633 Long term (current) use of mitotic inhibitor: Secondary | ICD-10-CM | POA: Insufficient documentation

## 2024-09-12 DIAGNOSIS — Z79630 Long term (current) use of alkylating agent: Secondary | ICD-10-CM | POA: Diagnosis not present

## 2024-09-12 DIAGNOSIS — Z5111 Encounter for antineoplastic chemotherapy: Secondary | ICD-10-CM | POA: Insufficient documentation

## 2024-09-12 DIAGNOSIS — Z5189 Encounter for other specified aftercare: Secondary | ICD-10-CM | POA: Insufficient documentation

## 2024-09-12 LAB — COMPREHENSIVE METABOLIC PANEL WITH GFR
ALT: 20 U/L (ref 0–44)
AST: 21 U/L (ref 15–41)
Albumin: 3.6 g/dL (ref 3.5–5.0)
Alkaline Phosphatase: 103 U/L (ref 38–126)
Anion gap: 9 (ref 5–15)
BUN: 20 mg/dL (ref 8–23)
CO2: 24 mmol/L (ref 22–32)
Calcium: 11 mg/dL — ABNORMAL HIGH (ref 8.9–10.3)
Chloride: 104 mmol/L (ref 98–111)
Creatinine, Ser: 1.64 mg/dL — ABNORMAL HIGH (ref 0.61–1.24)
GFR, Estimated: 46 mL/min — ABNORMAL LOW (ref >60)
Glucose, Bld: 110 mg/dL — ABNORMAL HIGH (ref 70–99)
Potassium: 4.1 mmol/L (ref 3.5–5.1)
Sodium: 137 mmol/L (ref 135–145)
Total Bilirubin: 0.4 mg/dL (ref 0.0–1.2)
Total Protein: 6.4 g/dL — ABNORMAL LOW (ref 6.5–8.1)

## 2024-09-12 LAB — CBC WITH DIFFERENTIAL/PLATELET
Abs Immature Granulocytes: 0.08 K/uL — ABNORMAL HIGH (ref 0.00–0.07)
Basophils Absolute: 0 K/uL (ref 0.0–0.1)
Basophils Relative: 0 %
Eosinophils Absolute: 0 K/uL (ref 0.0–0.5)
Eosinophils Relative: 0 %
HCT: 38.3 % — ABNORMAL LOW (ref 39.0–52.0)
Hemoglobin: 11.8 g/dL — ABNORMAL LOW (ref 13.0–17.0)
Immature Granulocytes: 1 %
Lymphocytes Relative: 10 %
Lymphs Abs: 1.2 K/uL (ref 0.7–4.0)
MCH: 27.3 pg (ref 26.0–34.0)
MCHC: 30.8 g/dL (ref 30.0–36.0)
MCV: 88.5 fL (ref 80.0–100.0)
Monocytes Absolute: 0.8 K/uL (ref 0.1–1.0)
Monocytes Relative: 6 %
Neutro Abs: 10.9 K/uL — ABNORMAL HIGH (ref 1.7–7.7)
Neutrophils Relative %: 83 %
Platelets: 238 K/uL (ref 150–400)
RBC: 4.33 MIL/uL (ref 4.22–5.81)
RDW: 17.9 % — ABNORMAL HIGH (ref 11.5–15.5)
WBC: 13 K/uL — ABNORMAL HIGH (ref 4.0–10.5)
nRBC: 0 % (ref 0.0–0.2)

## 2024-09-12 LAB — TSH: TSH: 1.3 u[IU]/mL (ref 0.350–4.500)

## 2024-09-12 LAB — MAGNESIUM: Magnesium: 2 mg/dL (ref 1.7–2.4)

## 2024-09-12 MED ORDER — SODIUM CHLORIDE 0.9 % IV SOLN: INTRAVENOUS | Status: DC

## 2024-09-12 MED ORDER — OXALIPLATIN CHEMO INJECTION 100 MG/20ML
85.0000 mg/m2 | Freq: Once | INTRAVENOUS | Status: AC
  Administered 2024-09-12: 200 mg via INTRAVENOUS
  Filled 2024-09-12: qty 40

## 2024-09-12 MED ORDER — LEUCOVORIN CALCIUM INJECTION 350 MG
200.0000 mg/m2 | Freq: Once | INTRAVENOUS | Status: AC
  Administered 2024-09-12: 488 mg via INTRAVENOUS
  Filled 2024-09-12: qty 24.4

## 2024-09-12 MED ORDER — PALONOSETRON HCL INJECTION 0.25 MG/5ML
0.2500 mg | Freq: Once | INTRAVENOUS | Status: AC
  Administered 2024-09-12: 0.25 mg via INTRAVENOUS
  Filled 2024-09-12: qty 5

## 2024-09-12 MED ORDER — DEXAMETHASONE SOD PHOSPHATE PF 10 MG/ML IJ SOLN
10.0000 mg | Freq: Once | INTRAMUSCULAR | Status: DC
  Filled 2024-09-12: qty 1

## 2024-09-12 MED ORDER — SODIUM CHLORIDE 0.9 % IV SOLN
2600.0000 mg/m2 | INTRAVENOUS | Status: DC
  Administered 2024-09-12: 6200 mg via INTRAVENOUS
  Filled 2024-09-12: qty 124

## 2024-09-12 MED ORDER — SODIUM CHLORIDE 0.9 % IV SOLN
50.0000 mg/m2 | Freq: Once | INTRAVENOUS | Status: AC
  Administered 2024-09-12: 122 mg via INTRAVENOUS
  Filled 2024-09-12: qty 12.2

## 2024-09-12 MED ORDER — DEXAMETHASONE SOD PHOSPHATE PF 10 MG/ML IJ SOLN
10.0000 mg | Freq: Once | INTRAMUSCULAR | Status: AC
  Administered 2024-09-12: 10 mg via INTRAVENOUS
  Filled 2024-09-12: qty 1

## 2024-09-12 MED ORDER — DEXTROSE 5 % IV SOLN: INTRAVENOUS | Status: DC

## 2024-09-12 NOTE — Progress Notes (Signed)
 Patient has been examined by Dr. Davonna. Vital signs and labs have been reviewed by MD - ANC, Creatinine, LFTs, hemoglobin, and platelets have been reviewed by M.D. - pt may proceed with treatment.  Primary RN and pharmacy notified.

## 2024-09-12 NOTE — Telephone Encounter (Signed)
 FYI Only or Action Required?: FYI only for provider.  Patient was last seen in primary care on 08/03/2024 by Raymond Suzzane POUR, MD.  Called Nurse Triage reporting Medication Refill and Hypertension.  Symptoms began today.  Interventions attempted: OTC medications: amLODipine .  Symptoms are: gradually improving.  Triage Disposition: See PCP When Office is Open (Within 3 Days)  Patient/caregiver understands and will follow disposition?: Yes           Reason for Disposition  Systolic BP >= 160 OR Diastolic >= 100    No access with PCP, scheduled with alternate [facility].  Answer Assessment - Initial Assessment Questions 1. BLOOD PRESSURE: What is your blood pressure? Did you take at least two measurements 5 minutes apart?     156/106 s/p cancer treatment 152/96 currently @ 1709 with P 95 - auto BP home machine 2. ONSET: When did you take your blood pressure?     today 3. HOW: How did you take your blood pressure? (e.g., automatic home BP monitor, visiting nurse)     Receives cancer treatment and HTN occurred s/p treatment as listed above. Oncologist advised to contact PCP to see if he needs to be restarted on amLODipine -olmesartan 4. HISTORY: Do you have a history of high blood pressure?     yes 5. MEDICINES: Are you taking any medicines for blood pressure? Have you missed any doses recently?     amLODipine  (NORVASC ) 10 MG. Denies missing doses. 6. OTHER SYMPTOMS: Do you have any symptoms? (e.g., blurred vision, chest pain, difficulty breathing, headache, weakness)     denies 7. PREGNANCY: Is there any chance you are pregnant? When was your last menstrual period?     N/a  Protocols used: Blood Pressure - High-A-AH

## 2024-09-12 NOTE — Progress Notes (Signed)
 Reviewed post infusion blood pressures with oncologist with verbal order to wait and recheck in a few minutes.  No complaints voiced by the patient and no s/s of distress noted.   1438 Reviewed all post Blood pressure's with the oncologist with verbal order ok to let the patient go if asymptomatic. Dr. Davonna in the room.   Patient denied headaches, SOB, and pain.  Wife at side.   No s/s of distress noted.   Patient tolerated chemotherapy with no complaints voiced.  Side effects with management reviewed with understanding verbalized.  Port site clean and dry with no bruising or swelling noted at site.  Good blood return noted before and after administration of chemotherapy.  Port dressing intact.   Patient left in satisfactory condition with VSS and no s/s of distress noted.

## 2024-09-12 NOTE — Progress Notes (Signed)
 Proceed with treatment with calcium  level of 11.3  Patient will receive NS 1 liter  V.O. Dr Ivery Molt, PharmD

## 2024-09-12 NOTE — Progress Notes (Signed)
 Patient Care Team: [name], MD as PCP - General (Internal Medicine) Francyne Headland, MD as PCP - Cardiology (Cardiology) Beuford Anes, MD as Consulting Physician (Orthopedic Surgery) Devere Lonni Righter, MD as Consulting Physician (Urology) Skeet Juliene SAUNDERS, DO as Consulting Physician (Neurology)  Clinic Day:  09/12/2024  Referring physician: [name], MD   CHIEF COMPLAINT:  CC: Gastric adenocarcinoma    ASSESSMENT & PLAN:   Assessment & Plan: Raymond Barrera.  is a 63 y.o. 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   Oncology history as below Likely stage I-T2N0M0 (T2-T3 per EUS report) PET scan with no evidence of metastatic disease Evaluated by surgical oncology at [facility] and recommended to follow-up after neoadjuvant chemoimmunotherapy 07/27/2024: Started on D-FLOT regimen per [facility] trial    - C4D1 today.   - Labs reviewed today: Creatinine: 1.64-stable, LFT WNL, CBC: WBC: 13.0, hemoglobin: 11.8, platelets: 238.  TSH: 1.3 - Physical exam stable today.  Can proceed with chemotherapy today. Will continue same doses - Patient will follow-up with Dr.Shen ( Surgical oncology) after completing 4 cycles of chemotherapy with worsening rest - Will obtain PET scan prior to visit with Dr. Debora on September 23, 2024.   Return to clinic prior to next cycle of chemotherapy to assess for tolerability  Cancer-related weight loss and poor appetite Significant weight loss and poor appetite during chemotherapy. Discussed potential feeding tube if weight loss persists, though not preferred by him.  - Consult with nutritionist Elvie for dietary management. - Monitor weight closely. - Consider feeding tube if weight loss continues.  Chemotherapy-induced skin changes Peeling skin on hands and hyperpigmentation on feet, likely  chemotherapy-related.  - Apply moisturizer to the affected area - Continue to monitor  Peripheral neuropathy Patient with baseline neuropathy from diabetes.  Numbness and tingling in hands remain unchanged.  Chemotherapy-induced nausea and diarrhea Mild nausea and occasional diarrhea post-treatment, no vomiting.  -As needed Zofran  and Imodium are available for the patient.  Hypercalcemia Elevated calcium  levels likely secondary to dehydration, possibly insufficient fluid intake.  - Administer one liter of IV fluids today. - Encourage increased oral fluid intake.  Tobacco use Patient is a chronic smoker and is currently smoking 1 to 2 cigarettes/day   - Encouraged his attempts to abstain from smoking  Chronic kidney disease stage III Chronic kidney disease likely secondary to only 1 functioning kidney Follows with Dr. Rachele   The patient understands the plans discussed today and is in agreement with them.  He knows to contact our office if he develops concerns prior to his next appointment.  I provided 30 minutes of face-to-face time during this encounter and > 50% was spent counseling as documented under my assessment and plan.    [name], MD  [facility] [place] - A DEPT OF [name] [address] [place] [address] Dept: [phone] Dept Fax: [phone]   No orders of the defined types were placed in this encounter.    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 Reactive gastropathy with intestinal metaplasia showing focal dysplasia. Negative for H. Pylori -05/11/2024: [facility] NGS:  CLDN18: Positive, PD-L1: Positive, CPS: 10, HER2: Negative   -MSI: Stable, MMR: Proficient, TMB: Low, 5 mut/Mb   -BRAF, RET,  NTRK 1/2/3, ARID1A, BCR, CCN E1, CDH 1, EBER, EGFR,   FGFR2, KRAS, MET, PIK3CA, RHOA: Negative -05/19/2024: PET scan: Only slight area of asymmetric uptake along the midbody of the stomach with an adjacent metallic clip. No additional areas of abnormal uptake at this time.  -06/15/2024: EUS : Staging gastric adenocarcinoma at least T2 (maybe early T3) N0 Mx by EUS.  -06/23/2024: IR guided port placement -07/12/2024: Evaluated by surgical oncology at [facility]: Thought to be borderline surgical candidate and will re-evaluate after 4 cycles of chemo immunotherapy -07/21/2024: PET: Persistent mildwall thickening involving the gastric fundus and body with mild, non-specific tracer uptake (SUV max 2.5, previously 3.6). No hypermetabolic nodal metastasis or distant metastatic disease. -07/27/2024- Current: Neoadjuvant D-FLOT    Current Treatment: Neoadjuvant chemotherapy with D-FLOT  INTERVAL HISTORY:   Discussed the use of AI scribe software for clinical note transcription with the patient, who gave verbal consent to proceed.  History of Present Illness Raymond Barrera is a 63 year old male who presents for follow-up for gastric adenocarcinoma.Patient is accompanied by his wife today.  He has experienced a significant decrease in appetite, particularly during treatment periods, leading to difficulty in maintaining adequate nutrition. Despite efforts from his caregivers to encourage eating by providing various foods and soups, he only manages to eat small amounts. This has resulted in noticeable weight loss, with his clothes no longer fitting properly and visible changes in his face and body.  He experiences skin issues, including peeling on his hands and dark spots on his feet. He uses cocoa butter to manage these symptoms.  He has mild nausea but no vomiting. He experiences occasional diarrhea, typically following treatment sessions. No difficulty swallowing or abdominal pain.  He notes persistent numbness and tingling in his hands,  with no change in these symptoms over time.   I have reviewed the past medical history, past surgical history, social history and family history with the patient and they are unchanged from previous note.  ALLERGIES:  is allergic to poison ivy extract [poison ivy extract] and coreg  [carvedilol ].  MEDICATIONS:  Current Outpatient Medications  Medication Sig Dispense Refill   Accu-Chek Softclix Lancets lancets SMARTSIG:Topical 2-3 Times Daily     acetaminophen  (TYLENOL ) 325 MG tablet Take 2 tablets (650 mg total) by mouth every 6 (six) hours as needed for mild pain (pain score 1-3) (or Fever >/= 101).     albuterol  (VENTOLIN  HFA) 108 (90 Base) MCG/ACT inhaler Inhale 2 puffs into the lungs every 6 (six) hours as needed for wheezing or shortness of breath (cough). 8 g 2   aspirin  EC 81 MG tablet Take 1 tablet (81 mg total) by mouth daily with breakfast. Swallow whole. 30 tablet 12   atorvastatin  (LIPITOR) 40 MG tablet Take 1 tablet (40 mg total) by mouth daily. 90 tablet 3   Camphor-Menthol -Methyl Sal (SALONPAS EX) Apply topically. As needed     cyanocobalamin  (VITAMIN B12) 1000 MCG tablet Take 1,000 mcg by mouth daily.     dexamethasone  (DECADRON ) 4 MG tablet Take 2 tablets (8 mg total) by mouth daily. Start the day after chemotherapy for 2 days. Take with food. 30 tablet 1   dextrose  5 % SOLN 1,000 mL with fluorouracil  5 GM/100ML SOLN Inject into the vein every 14 (fourteen) days.     diphenoxylate -atropine  (LOMOTIL ) 2.5-0.025 MG tablet Take 2 tablets by mouth 4 (four) times daily as needed for diarrhea or loose stools. 60 tablet 0   DOCETAXEL  IV  Inject into the vein every 14 (fourteen) days.     DULoxetine  (CYMBALTA ) 30 MG capsule TAKE 1 CAPSULE(30 MG) BY MOUTH TWICE DAILY 60 capsule 3   Durvalumab  (IMFINZI  IV) Inject into the vein every 28 (twenty-eight) days.     ferrous sulfate  325 (65 FE) MG EC tablet Take 1 tablet (325 mg total) by mouth every Tuesday, Thursday, Saturday, and Sunday. 45  tablet 3   gabapentin  (NEURONTIN ) 300 MG capsule Take 1 capsule (300 mg total) by mouth at bedtime. 90 capsule 1   hydrOXYzine  (ATARAX ) 25 MG tablet TAKE 1 TABLET(25 MG) BY MOUTH THREE TIMES DAILY AS NEEDED 30 tablet 0   JARDIANCE  10 MG TABS tablet Take 1 tablet (10 mg total) by mouth daily. 30 tablet 5   LEUCOVORIN  CALCIUM  IV Inject into the vein every 14 (fourteen) days.     lidocaine -prilocaine  (EMLA ) cream Apply to affected area once 30 g 3   ondansetron  (ZOFRAN ) 8 MG tablet Take 1 tablet (8 mg total) by mouth every 8 (eight) hours as needed for nausea or vomiting. Start on the third day after chemotherapy. 30 tablet 1   OXALIPLATIN  IV Inject into the vein every 14 (fourteen) days.     pantoprazole  (PROTONIX ) 40 MG tablet TAKE 1 TABLET(40 MG) BY MOUTH TWICE DAILY 60 tablet 2   polyethylene glycol powder (GLYCOLAX /MIRALAX ) 17 GM/SCOOP powder Take 8.5 g by mouth daily.     prochlorperazine  (COMPAZINE ) 10 MG tablet Take 1 tablet (10 mg total) by mouth every 6 (six) hours as needed for nausea or vomiting. 30 tablet 1   rOPINIRole  (REQUIP ) 2 MG tablet Take 1 tablet (2 mg total) by mouth at bedtime. 30 tablet 5   triamcinolone  cream (KENALOG ) 0.1 % Apply 1 Application topically 2 (two) times daily. 30 g 0   umeclidinium-vilanterol (ANORO ELLIPTA ) 62.5-25 MCG/ACT AEPB Inhale 1 puff into the lungs daily. 60 each 5   No current facility-administered medications for this visit.    REVIEW OF SYSTEMS:   Constitutional: Denies fevers, chills or abnormal weight loss Eyes: Denies blurriness of vision Ears, nose, mouth, throat, and face: Denies mucositis or sore throat Respiratory: Denies cough, dyspnea or wheezes Cardiovascular: Denies palpitation, chest discomfort or lower extremity swelling Gastrointestinal:  Denies nausea, heartburn or change in bowel habits Skin: Denies abnormal skin rashes Lymphatics: Denies new lymphadenopathy or easy bruising Neurological: Positive for numbness in hands,  Denies numbness, tingling or new weaknesses All other systems were reviewed with the patient and are negative.   VITALS:  There were no vitals taken for this visit.  Wt Readings from Last 3 Encounters:  08/24/24 228 lb (103.4 kg)  08/10/24 235 lb 3.2 oz (106.7 kg)  08/05/24 235 lb (106.6 kg)    There is no height or weight on file to calculate BMI.  Performance status (ECOG): 1 - Symptomatic but completely ambulatory  PHYSICAL EXAM:   GENERAL:alert, no distress and comfortable SKIN:Some skin peeling in bilateral hands and hyperpigmentation spots on bilateral feet LYMPH:  no palpable lymphadenopathy in the cervical, axillary or inguinal LUNGS: clear to auscultation and percussion with normal breathing effort HEART: regular rate & rhythm and no murmurs and no lower extremity edema ABDOMEN:abdomen soft, non-tender and normal bowel sounds Musculoskeletal:no cyanosis of digits and no clubbing  NEURO: alert & oriented x 3 with fluent speech  LABORATORY DATA:  I have reviewed the data as listed    Component Value Date/Time   NA 138 08/24/2024 0838   NA 139 01/26/2024  1620   K 3.7 08/24/2024 0838   CL 103 08/24/2024 0838   CO2 24 08/24/2024 0838   GLUCOSE 147 (H) 08/24/2024 0838   BUN 18 08/24/2024 0838   BUN 28 (H) 01/26/2024 1620   CREATININE 1.93 (H) 08/24/2024 0838   CALCIUM  10.3 08/24/2024 0838   PROT 6.4 (L) 08/24/2024 0838   PROT 7.2 01/01/2024 1012   ALBUMIN  3.3 (L) 08/24/2024 0838   ALBUMIN  4.4 01/01/2024 1012   AST 18 08/24/2024 0838   ALT 19 08/24/2024 0838   ALKPHOS 121 08/24/2024 0838   BILITOT 0.5 08/24/2024 0838   BILITOT <0.2 01/01/2024 1012   GFRNONAA 38 (L) 08/24/2024 0838   GFRAA 40 (L) 04/12/2020 1425    Lab Results  Component Value Date   WBC 12.7 (H) 08/24/2024   NEUTROABS 9.5 (H) 08/24/2024   HGB 11.5 (L) 08/24/2024   HCT 37.2 (L) 08/24/2024   MCV 89.0 08/24/2024   PLT 253 08/24/2024      Chemistry      Component Value Date/Time   NA  138 08/24/2024 0838   NA 139 01/26/2024 1620   K 3.7 08/24/2024 0838   CL 103 08/24/2024 0838   CO2 24 08/24/2024 0838   BUN 18 08/24/2024 0838   BUN 28 (H) 01/26/2024 1620   CREATININE 1.93 (H) 08/24/2024 0838      Component Value Date/Time   CALCIUM  10.3 08/24/2024 0838   ALKPHOS 121 08/24/2024 0838   AST 18 08/24/2024 0838   ALT 19 08/24/2024 0838   BILITOT 0.5 08/24/2024 0838   BILITOT <0.2 01/01/2024 1012      Latest Reference Range & Units 09/12/24 08:12  TSH 0.350 - 4.500 uIU/mL 1.300    RADIOGRAPHIC STUDIES: I have personally reviewed the radiological images as listed and agreed with the findings in the report.  None new to review

## 2024-09-12 NOTE — Patient Instructions (Signed)

## 2024-09-12 NOTE — Patient Instructions (Signed)

## 2024-09-12 NOTE — Progress Notes (Signed)
 Nutrition Follow-up:  Pt with gastric cancer. He receiving neoadjuvant D-FLOT Patient is under the care of Dr. Davonna   Met with patient and wife in infusion. He has decreased appetite secondary to taste changes. Patient having hard time eating when foods taste bad. He does well with soups, jello, and fruits. Patient has diarrhea 2-3 days following treatment. Wife has lomotil  and planning to start him on this after the first episode starts. He is drinking a good amount of water . Patient drinking some dark sodas. Wife asking if this is okay. Patient drinking Ensure, but not regularly.   Medications: reviewed   Labs: reviewed   Anthropometrics: Wt 215 lb today decreased 5.7% in 3 weeks - severe for time frame  9/24 - 228 lb  9/10 - 235 lb 3.2 oz    NUTRITION DIAGNOSIS: Food and nutrition related knowledge deficit improving    INTERVENTION:  Reviewed strategies for taste changes, suggested trying baking soda salt water  gargles several times daily before meals - additional copy of handout + recipe given Reviewed strategies for diarrhea - foods best tolerated and foods to avoid - provided samples of Ensure Clear for pt to try Encourage 5-7 small meals/snacks  Continue Ensure HP, recommend 2-3/day in between meals Support and encouragement     MONITORING, EVALUATION, GOAL: wt trends, intake    NEXT VISIT: Monday November 3 during infusion

## 2024-09-12 NOTE — Telephone Encounter (Signed)
 73m Recall has been placed.

## 2024-09-13 ENCOUNTER — Inpatient Hospital Stay

## 2024-09-13 VITALS — BP 127/89 | HR 90 | Temp 98.4°F | Resp 18

## 2024-09-13 DIAGNOSIS — C169 Malignant neoplasm of stomach, unspecified: Secondary | ICD-10-CM

## 2024-09-13 DIAGNOSIS — Z5111 Encounter for antineoplastic chemotherapy: Secondary | ICD-10-CM | POA: Diagnosis not present

## 2024-09-13 MED ORDER — PEGFILGRASTIM-CBQV 6 MG/0.6ML ~~LOC~~ SOSY
6.0000 mg | PREFILLED_SYRINGE | Freq: Once | SUBCUTANEOUS | Status: AC
  Administered 2024-09-13: 6 mg via SUBCUTANEOUS
  Filled 2024-09-13: qty 0.6

## 2024-09-13 NOTE — Patient Instructions (Signed)
 [facility] [place] - A DEPT OF [facility]  Discharge Instructions: Thank you for choosing [facility] to provide your oncology and hematology care.  If you have a lab appointment with the Cancer Center - please note that after April 8th, 2024, all labs will be drawn in the cancer center.  You do not have to check in or register with the main entrance as you have in the past but will complete your check-in in the cancer center.  Wear comfortable clothing and clothing appropriate for easy access to any Portacath or PICC line.   We strive to give you quality time with your provider. You may need to reschedule your appointment if you arrive late (15 or more minutes).  Arriving late affects you and other patients whose appointments are after yours.  Also, if you miss three or more appointments without notifying the office, you may be dismissed from the clinic at the provider's discretion.      For prescription refill requests, have your pharmacy contact our office and allow 72 hours for refills to be completed.    Today you received the following udenyca  injection return as scheduled.   To help prevent nausea and vomiting after your treatment, we encourage you to take your nausea medication as directed.  BELOW ARE SYMPTOMS THAT SHOULD BE REPORTED IMMEDIATELY: *FEVER GREATER THAN 100.4 F (38 C) OR HIGHER *CHILLS OR SWEATING *NAUSEA AND VOMITING THAT IS NOT CONTROLLED WITH YOUR NAUSEA MEDICATION *UNUSUAL SHORTNESS OF BREATH *UNUSUAL BRUISING OR BLEEDING *URINARY PROBLEMS (pain or burning when urinating, or frequent urination) *BOWEL PROBLEMS (unusual diarrhea, constipation, pain near the anus) TENDERNESS IN MOUTH AND THROAT WITH OR WITHOUT PRESENCE OF ULCERS (sore throat, sores in mouth, or a toothache) UNUSUAL RASH, SWELLING OR PAIN  UNUSUAL VAGINAL DISCHARGE OR ITCHING   Items with * indicate a potential emergency and should be followed up as soon as  possible or go to the Emergency Department if any problems should occur.  Please show the CHEMOTHERAPY ALERT CARD or IMMUNOTHERAPY ALERT CARD at check-in to the Emergency Department and triage nurse.  Should you have questions after your visit or need to cancel or reschedule your appointment, please contact [facility] CANCER CTR [place] - A DEPT OF [name] [phone]  and follow the prompts.  Office hours are 8:00 a.m. to 4:30 p.m. Monday - Friday. Please note that voicemails left after 4:00 p.m. may not be returned until the following business day.  We are closed weekends and major holidays. You have access to a nurse at all times for urgent questions. Please call the main number to the clinic [phone] and follow the prompts.  For any non-urgent questions, you may also contact your provider using MyChart. We now offer e-Visits for anyone 37 and older to request care online for non-urgent symptoms. For details visit [email or web address].   Also download the MyChart app! Go to the app store, search MyChart, open the app, select [facility], and log in with your MyChart username and password.

## 2024-09-13 NOTE — Progress Notes (Signed)
 Port flushed with good blood return noted. No bruising or swelling at site. Bandaid applied and patient discharged in satisfactory condition. VVS stable with no signs or symptoms of distressed noted.

## 2024-09-13 NOTE — Telephone Encounter (Signed)
 Appt made.

## 2024-09-13 NOTE — Patient Instructions (Signed)
 [facility] [place] - A DEPT OF [facility]  Discharge Instructions: Thank you for choosing [facility] to provide your oncology and hematology care.  If you have a lab appointment with the Cancer Center - please note that after April 8th, 2024, all labs will be drawn in the cancer center.  You do not have to check in or register with the main entrance as you have in the past but will complete your check-in in the cancer center.  Wear comfortable clothing and clothing appropriate for easy access to any Portacath or PICC line.   We strive to give you quality time with your provider. You may need to reschedule your appointment if you arrive late (15 or more minutes).  Arriving late affects you and other patients whose appointments are after yours.  Also, if you miss three or more appointments without notifying the office, you may be dismissed from the clinic at the provider's discretion.      For prescription refill requests, have your pharmacy contact our office and allow 72 hours for refills to be completed.    Today you received the following port flush, return as scheduled.   To help prevent nausea and vomiting after your treatment, we encourage you to take your nausea medication as directed.  BELOW ARE SYMPTOMS THAT SHOULD BE REPORTED IMMEDIATELY: *FEVER GREATER THAN 100.4 F (38 C) OR HIGHER *CHILLS OR SWEATING *NAUSEA AND VOMITING THAT IS NOT CONTROLLED WITH YOUR NAUSEA MEDICATION *UNUSUAL SHORTNESS OF BREATH *UNUSUAL BRUISING OR BLEEDING *URINARY PROBLEMS (pain or burning when urinating, or frequent urination) *BOWEL PROBLEMS (unusual diarrhea, constipation, pain near the anus) TENDERNESS IN MOUTH AND THROAT WITH OR WITHOUT PRESENCE OF ULCERS (sore throat, sores in mouth, or a toothache) UNUSUAL RASH, SWELLING OR PAIN  UNUSUAL VAGINAL DISCHARGE OR ITCHING   Items with * indicate a potential emergency and should be followed up as soon as possible  or go to the Emergency Department if any problems should occur.  Please show the CHEMOTHERAPY ALERT CARD or IMMUNOTHERAPY ALERT CARD at check-in to the Emergency Department and triage nurse.  Should you have questions after your visit or need to cancel or reschedule your appointment, please contact [facility] CANCER CTR [place] - A DEPT OF [name] [phone]  and follow the prompts.  Office hours are 8:00 a.m. to 4:30 p.m. Monday - Friday. Please note that voicemails left after 4:00 p.m. may not be returned until the following business day.  We are closed weekends and major holidays. You have access to a nurse at all times for urgent questions. Please call the main number to the clinic [phone] and follow the prompts.  For any non-urgent questions, you may also contact your provider using MyChart. We now offer e-Visits for anyone 88 and older to request care online for non-urgent symptoms. For details visit [email or web address].   Also download the MyChart app! Go to the app store, search MyChart, open the app, select , and log in with your MyChart username and password.

## 2024-09-13 NOTE — Progress Notes (Signed)
 Patient tolerated injection with no complaints voiced. Site clean and dry with no bruising or swelling noted at site. See MAR for details. Band aid applied.  Patient stable during and after injection. VSS with discharge and left in satisfactory condition with no s/s of distress noted.

## 2024-09-14 ENCOUNTER — Ambulatory Visit: Admitting: Family Medicine

## 2024-09-14 ENCOUNTER — Inpatient Hospital Stay

## 2024-09-14 ENCOUNTER — Telehealth: Payer: Self-pay | Admitting: Internal Medicine

## 2024-09-14 ENCOUNTER — Encounter (INDEPENDENT_AMBULATORY_CARE_PROVIDER_SITE_OTHER): Payer: Self-pay | Admitting: Gastroenterology

## 2024-09-14 NOTE — Telephone Encounter (Signed)
 This RN called patient attempt regarding symptoms and rescheduling pt appt for HTN triage note 10/13.   Copied from CRM [id number]. Topic: Clinical - Pink Word Triage >> Sep 14, 2024  9:28 AM Turkey B wrote: Patient had diarrhea and vomiting after his infusion, this morning but not now, wants to reschedule appt for today

## 2024-09-14 NOTE — Telephone Encounter (Unsigned)
 Copied from CRM [id number]. Topic: Clinical - Pink Word Triage >> Sep 14, 2024  9:28 AM Turkey B wrote: Patient had diarrhea and vomiting after his infusion, this morning but not now, wants to reschedule appt for today

## 2024-09-18 ENCOUNTER — Encounter: Payer: Self-pay | Admitting: Internal Medicine

## 2024-09-19 ENCOUNTER — Encounter: Payer: Self-pay | Admitting: *Deleted

## 2024-09-19 ENCOUNTER — Ambulatory Visit (HOSPITAL_COMMUNITY): Admission: RE | Admit: 2024-09-19 | Source: Ambulatory Visit

## 2024-09-19 ENCOUNTER — Telehealth: Payer: Self-pay | Admitting: Cardiovascular Disease

## 2024-09-19 MED ORDER — AMLODIPINE BESYLATE 2.5 MG PO TABS: 2.5000 mg | ORAL_TABLET | Freq: Every day | ORAL | 3 refills | Status: AC

## 2024-09-19 NOTE — Telephone Encounter (Signed)
 Called patient left voicemail to call to schedule visit.

## 2024-09-19 NOTE — Progress Notes (Signed)
 Patient's wife called, patient had his udenyca  last week on Tuesday and by late Wednesday/Thursday he was having 8/10 pain in his bilateral hips. He has been taking tylenol  arthritis and using icy hot but has not gotten any relief since then. She also reports that he has had decreased appetite for a few days after treatment.   We talked about several things and I have re-educated her on things that the can do to help him during this time post treatment.  I advised on taking OTC Claritin for 7 days post treatment to avoid the bone pain.  I also advised for him to stay hydrated, not eat heavy meals or spicy meals prior to his treatment or the day after as this can cause some nausea.  I advised his wife that if she has any questions or concerns to always call us  back.  She verbalized understanding.

## 2024-09-19 NOTE — Telephone Encounter (Signed)
 Spoke with pt's wife and she stated that the pt's BP has been elevated and pt's oncologist and PCP recommended calling our office to ask to have pt placed back on Amlodipine  5 mg. Pt was originally taken off due to orthostatic hypotension issues at OV on 08/05/24. Explained that we will send this information to Dr. Francyne and his nurse to review and our office will be back in touch with recommendations. [name] verbalized understanding of plan and had no further questions at this time.

## 2024-09-19 NOTE — Telephone Encounter (Signed)
 Croitoru, Mihai, MD to [facility] Triage     09/19/24  4:53 PM Let's go half way: please restart amlodipine  2.5 mg daily. Please check BP sitting for 10 minutes and then after standing up for one minute and send us  a week of readings.  S/w [name] (EC) and gave the information above. Prescription sent to preferred pharmacy. She verbalized understanding of all information.

## 2024-09-19 NOTE — Telephone Encounter (Signed)
 STAT if patient feels like he/she is going to faint   1. Are you feeling dizzy, lightheaded, or faint right now? yes   2. Have you passed out?  no (If yes move to .SYNCOPECHMG)  3. Do you have any other symptoms? Wife states he just is feeling dizzy and unsteady.   4. Have you checked your HR and BP (record if available)? 10/19 144/90 HR 77  Wife states husband's cancer doctor and PCP advised her to call us  and said patient should be put back on amoldipine 5mg .

## 2024-09-21 ENCOUNTER — Other Ambulatory Visit: Payer: Self-pay | Admitting: Oncology

## 2024-09-21 ENCOUNTER — Inpatient Hospital Stay (HOSPITAL_COMMUNITY): Admission: RE | Admit: 2024-09-21 | Source: Ambulatory Visit

## 2024-09-21 DIAGNOSIS — C169 Malignant neoplasm of stomach, unspecified: Secondary | ICD-10-CM

## 2024-09-22 ENCOUNTER — Encounter (HOSPITAL_COMMUNITY)
Admission: RE | Admit: 2024-09-22 | Discharge: 2024-09-22 | Disposition: A | Discharge status: Home / Self Care | Source: Ambulatory Visit | Attending: Oncology | Admitting: Oncology

## 2024-09-22 DIAGNOSIS — C169 Malignant neoplasm of stomach, unspecified: Secondary | ICD-10-CM | POA: Insufficient documentation

## 2024-09-22 DIAGNOSIS — D1724 Benign lipomatous neoplasm of skin and subcutaneous tissue of left leg: Secondary | ICD-10-CM | POA: Diagnosis not present

## 2024-09-22 LAB — GLUCOSE, CAPILLARY: Glucose-Capillary: 116 mg/dL — ABNORMAL HIGH (ref 70–99)

## 2024-09-22 MED ORDER — FLUDEOXYGLUCOSE F - 18 (FDG) INJECTION
10.7000 | Freq: Once | INTRAVENOUS | Status: AC
  Administered 2024-09-22: 10.7 via INTRAVENOUS

## 2024-09-22 NOTE — Progress Notes (Signed)
   09/22/2024  Patient ID: Raymond CHRISTELLA Shona Mickey., male   DOB: [birth year], 63 y.o.   MRN: [id number]  Pharmacy Quality Measure Review  This patient is appearing on a report for being at risk of failing the adherence measure for diabetes medications this calendar year.   Medication: jardiance  10mg  Last fill date: 09/05/24 for 30 day supply  Insurance report was not up to date. No action needed at this time.   Will pass MAD measure if filled once more in 2026.   Lang Sieve, PharmD, BCGP Clinical Pharmacist  [phone]

## 2024-09-23 DIAGNOSIS — C162 Malignant neoplasm of body of stomach: Secondary | ICD-10-CM | POA: Diagnosis not present

## 2024-09-24 DIAGNOSIS — G4733 Obstructive sleep apnea (adult) (pediatric): Secondary | ICD-10-CM | POA: Diagnosis not present

## 2024-09-26 ENCOUNTER — Ambulatory Visit

## 2024-09-26 ENCOUNTER — Telehealth: Payer: Self-pay

## 2024-09-26 ENCOUNTER — Other Ambulatory Visit

## 2024-09-26 DIAGNOSIS — C169 Malignant neoplasm of stomach, unspecified: Secondary | ICD-10-CM | POA: Diagnosis not present

## 2024-09-26 NOTE — Telephone Encounter (Signed)
 Copied from CRM [id number]. Topic: Clinical - Medical Advice >> Sep 22, 2024  3:36 PM Devaughn RAMAN wrote: Reason for CRM: Patient wife [name] called in to inform NP Almarie Ferrari the pt will no longer be using his CPAP Machine.  ATC x1- went straight to vm, left vm to call back. Tried calling the patient again, went straight to vm as well.  FYI Beth.

## 2024-09-27 ENCOUNTER — Ambulatory Visit

## 2024-09-27 ENCOUNTER — Inpatient Hospital Stay (HOSPITAL_BASED_OUTPATIENT_CLINIC_OR_DEPARTMENT_OTHER): Admitting: Oncology

## 2024-09-27 ENCOUNTER — Inpatient Hospital Stay

## 2024-09-27 ENCOUNTER — Encounter

## 2024-09-27 VITALS — BP 158/11 | HR 80 | Resp 20 | Wt 220.0 lb

## 2024-09-27 DIAGNOSIS — G6289 Other specified polyneuropathies: Secondary | ICD-10-CM

## 2024-09-27 DIAGNOSIS — C169 Malignant neoplasm of stomach, unspecified: Secondary | ICD-10-CM

## 2024-09-27 DIAGNOSIS — Z5111 Encounter for antineoplastic chemotherapy: Secondary | ICD-10-CM | POA: Diagnosis not present

## 2024-09-27 DIAGNOSIS — Z720 Tobacco use: Secondary | ICD-10-CM | POA: Diagnosis not present

## 2024-09-27 DIAGNOSIS — R21 Rash and other nonspecific skin eruption: Secondary | ICD-10-CM | POA: Diagnosis not present

## 2024-09-27 DIAGNOSIS — D500 Iron deficiency anemia secondary to blood loss (chronic): Secondary | ICD-10-CM

## 2024-09-27 DIAGNOSIS — N1832 Chronic kidney disease, stage 3b: Secondary | ICD-10-CM | POA: Diagnosis not present

## 2024-09-27 LAB — CBC WITH DIFFERENTIAL/PLATELET
Abs Immature Granulocytes: 0.37 K/uL — ABNORMAL HIGH (ref 0.00–0.07)
Basophils Absolute: 0 K/uL (ref 0.0–0.1)
Basophils Relative: 0 %
Eosinophils Absolute: 0 K/uL (ref 0.0–0.5)
Eosinophils Relative: 0 %
HCT: 37.9 % — ABNORMAL LOW (ref 39.0–52.0)
Hemoglobin: 11.7 g/dL — ABNORMAL LOW (ref 13.0–17.0)
Immature Granulocytes: 2 %
Lymphocytes Relative: 8 %
Lymphs Abs: 1.3 K/uL (ref 0.7–4.0)
MCH: 27.3 pg (ref 26.0–34.0)
MCHC: 30.9 g/dL (ref 30.0–36.0)
MCV: 88.3 fL (ref 80.0–100.0)
Monocytes Absolute: 0.9 K/uL (ref 0.1–1.0)
Monocytes Relative: 6 %
Neutro Abs: 13.3 K/uL — ABNORMAL HIGH (ref 1.7–7.7)
Neutrophils Relative %: 84 %
Platelets: 164 K/uL (ref 150–400)
RBC: 4.29 MIL/uL (ref 4.22–5.81)
RDW: 19.4 % — ABNORMAL HIGH (ref 11.5–15.5)
WBC: 16 K/uL — ABNORMAL HIGH (ref 4.0–10.5)
nRBC: 0.2 % (ref 0.0–0.2)

## 2024-09-27 LAB — COMPREHENSIVE METABOLIC PANEL WITH GFR
ALT: 16 U/L (ref 0–44)
AST: 20 U/L (ref 15–41)
Albumin: 3.6 g/dL (ref 3.5–5.0)
Alkaline Phosphatase: 130 U/L — ABNORMAL HIGH (ref 38–126)
Anion gap: 8 (ref 5–15)
BUN: 10 mg/dL (ref 8–23)
CO2: 27 mmol/L (ref 22–32)
Calcium: 10.5 mg/dL — ABNORMAL HIGH (ref 8.9–10.3)
Chloride: 104 mmol/L (ref 98–111)
Creatinine, Ser: 1.48 mg/dL — ABNORMAL HIGH (ref 0.61–1.24)
GFR, Estimated: 52 mL/min — ABNORMAL LOW (ref >60)
Glucose, Bld: 110 mg/dL — ABNORMAL HIGH (ref 70–99)
Potassium: 4.1 mmol/L (ref 3.5–5.1)
Sodium: 139 mmol/L (ref 135–145)
Total Bilirubin: 0.5 mg/dL (ref 0.0–1.2)
Total Protein: 6.2 g/dL — ABNORMAL LOW (ref 6.5–8.1)

## 2024-09-27 LAB — MAGNESIUM: Magnesium: 1.9 mg/dL (ref 1.7–2.4)

## 2024-09-27 LAB — TSH: TSH: 1.32 u[IU]/mL (ref 0.350–4.500)

## 2024-09-27 MED ORDER — DEXAMETHASONE SOD PHOSPHATE PF 10 MG/ML IJ SOLN
10.0000 mg | Freq: Once | INTRAMUSCULAR | Status: AC
  Administered 2024-09-27: 10 mg via INTRAVENOUS

## 2024-09-27 MED ORDER — LIDOCAINE-PRILOCAINE 2.5-2.5 % EX CREA: 1.0000 | TOPICAL_CREAM | CUTANEOUS | 1 refills | Status: AC | PRN

## 2024-09-27 MED ORDER — SODIUM CHLORIDE 0.9 % IV SOLN
1500.0000 mg | Freq: Once | INTRAVENOUS | Status: AC
  Administered 2024-09-27: 1500 mg via INTRAVENOUS
  Filled 2024-09-27: qty 30

## 2024-09-27 MED ORDER — DEXTROSE 5 % IV SOLN: INTRAVENOUS | Status: DC

## 2024-09-27 MED ORDER — SODIUM CHLORIDE 0.9 % IV SOLN
50.0000 mg/m2 | Freq: Once | INTRAVENOUS | Status: AC
  Administered 2024-09-27: 122 mg via INTRAVENOUS
  Filled 2024-09-27: qty 12.2

## 2024-09-27 MED ORDER — SODIUM CHLORIDE 0.9 % IV SOLN: INTRAVENOUS | Status: DC

## 2024-09-27 MED ORDER — PALONOSETRON HCL INJECTION 0.25 MG/5ML
0.2500 mg | Freq: Once | INTRAVENOUS | Status: AC
  Administered 2024-09-27: 0.25 mg via INTRAVENOUS
  Filled 2024-09-27: qty 5

## 2024-09-27 MED ORDER — LEUCOVORIN CALCIUM INJECTION 350 MG
200.0000 mg/m2 | Freq: Once | INTRAVENOUS | Status: AC
  Administered 2024-09-27: 488 mg via INTRAVENOUS
  Filled 2024-09-27: qty 24.4

## 2024-09-27 MED ORDER — OXALIPLATIN CHEMO INJECTION 100 MG/20ML
85.0000 mg/m2 | Freq: Once | INTRAVENOUS | Status: AC
  Administered 2024-09-27: 200 mg via INTRAVENOUS
  Filled 2024-09-27: qty 40

## 2024-09-27 MED ORDER — SODIUM CHLORIDE 0.9 % IV SOLN
6200.0000 mg | INTRAVENOUS | Status: DC
  Administered 2024-09-27: 6200 mg via INTRAVENOUS
  Filled 2024-09-27: qty 124

## 2024-09-27 NOTE — Progress Notes (Signed)
 1509-reviewed post infusion vital signs with oncologist.  Patient is asymptomatic and denied chest pain, SOB, and dizziness.  Ok to discharge verbal order Dr. Davonna.   Patient tolerated chemotherapy with no complaints voiced.  Side effects with management reviewed with understanding verbalized.  Port site clean and dry with no bruising or swelling noted at site.  Good blood return noted before and after administration of chemotherapy.  Dressing intact with no alarms noted with volume decrease on pump noted.   Patient left in satisfactory condition with VSS and no s/s of distress noted.

## 2024-09-27 NOTE — Telephone Encounter (Signed)
 Spoke with the pts wife (DPR) she says pt is doing well without using CPAP and is sleeping better without it.

## 2024-09-27 NOTE — Progress Notes (Signed)
 Patient has been examined by Dr. Davonna. Vital signs and labs have been reviewed by MD - ANC, Creatinine, LFTs, hemoglobin, and platelets have been reviewed by M.D. - pt may proceed with treatment.  Primary RN and pharmacy notified.

## 2024-09-27 NOTE — Patient Instructions (Addendum)
 [facility] at [facility] Discharge Instructions   You were seen and examined today by Dr. Davonna.  She reviewed the results of your lab work which are normal/stable.   She reviewed the results of your PET scan which did not show any evidence of cancer.   We will proceed with your treatment today.   Return as scheduled.    Thank you for choosing [facility] at [facility] to provide your oncology and hematology care.  To afford each patient quality time with our provider, please arrive at least 15 minutes before your scheduled appointment time.   If you have a lab appointment with the Cancer Center please come in thru the Main Entrance and check in at the main information desk.  You need to re-schedule your appointment should you arrive 10 or more minutes late.  We strive to give you quality time with our providers, and arriving late affects you and other patients whose appointments are after yours.  Also, if you no show three or more times for appointments you may be dismissed from the clinic at the providers discretion.     Again, thank you for choosing [facility].  Our hope is that these requests will decrease the amount of time that you wait before being seen by our physicians.       _____________________________________________________________  Should you have questions after your visit to [facility], please contact our office at [phone] and follow the prompts.  Our office hours are 8:00 a.m. and 4:30 p.m. Monday - Friday.  Please note that voicemails left after 4:00 p.m. may not be returned until the following business day.  We are closed weekends and major holidays.  You do have access to a nurse 24-7, just call the main number to the clinic [phone] and do not press any options, hold on the line and a nurse will answer the phone.    For prescription refill requests, have your pharmacy contact our  office and allow 72 hours.    Due to Covid, you will need to wear a mask upon entering the hospital. If you do not have a mask, a mask will be given to you at the Main Entrance upon arrival. For doctor visits, patients may have 1 support person age 97 or older with them. For treatment visits, patients can not have anyone with them due to social distancing guidelines and our immunocompromised population.

## 2024-09-27 NOTE — Progress Notes (Signed)
 Patient Care Team: [name], MD as PCP - General (Internal Medicine) Francyne Headland, MD as PCP - Cardiology (Cardiology) Beuford Anes, MD as Consulting Physician (Orthopedic Surgery) Devere Lonni Righter, MD as Consulting Physician (Urology) Skeet Juliene SAUNDERS, DO as Consulting Physician (Neurology)  Clinic Day:  09/27/2024  Referring physician: [name], MD   CHIEF COMPLAINT:  CC: Gastric adenocarcinoma    ASSESSMENT & PLAN:   Assessment & Plan: Raymond Barrera.  is a 63 y.o. 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   Oncology history as below Likely stage I-T2N0M0 (T2-T3 per EUS report) PET scan with no evidence of metastatic disease Evaluated by surgical oncology at [facility] and recommended to follow-up after neoadjuvant chemoimmunotherapy 07/27/2024: Started on D-FLOT regimen per [facility]TERHORN trial    - C5D1 today.  -PET scan reviewed today together.  There is no evidence of metabolically active disease related to gastric cancer. - Patient was seen by Dr. Debora at [facility] recently.  Plan to be seen soon to discuss further treatment options.  Plan to repeat EUS at [facility] - Labs reviewed today: Creatinine: 1.48-stable, LFTs WNL, alkaline phosphatase elevated at 130.  CBC: WBC: 16.0, hemoglobin: 11.7, platelets: 164.  TSH: 1.320 - Physical exam stable today.  Can proceed with chemotherapy today. Will continue same doses   Return to clinic prior to next cycle of chemotherapy to assess for tolerability  Cancer-related weight loss and poor appetite Significant weight loss and poor appetite during chemotherapy. Discussed potential feeding tube if weight loss persists, though not preferred by him.  - Monitor weight closely.  Improved currently - Consider feeding tube if weight loss continues.  Chemotherapy-induced skin  changes Peeling skin on hands and hyperpigmentation on feet, likely chemotherapy-related.  - Apply moisturizer to the affected area - Continue to monitor.  Significantly improved.  Peripheral neuropathy Patient with baseline neuropathy from diabetes.  Numbness and tingling in hands remain unchanged.  Chemotherapy-induced nausea and diarrhea Mild nausea and occasional diarrhea post-treatment, no vomiting.  -As needed Zofran  and Imodium are available for the patient.  Hypercalcemia Elevated calcium  levels likely secondary to dehydration, possibly insufficient fluid intake.  - Encourage increased oral fluid intake.  Tobacco use Patient is a chronic smoker and is currently smoking 1 to 2 cigarettes/day   - Encouraged his attempts to abstain from smoking  Chronic kidney disease stage III Chronic kidney disease likely secondary to only 1 functioning kidney  Follows with Dr. 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   - Currently in remission    The patient understands the plans discussed today and is in agreement with them.  He knows to contact our office if he develops concerns prior to his next appointment.  I provided 40 minutes of face-to-face time during this encounter and > 50% was spent counseling as documented under my assessment and plan.    [name], MD   CANCER CENTER [facility] [place] - A DEPT OF [name] [address] [place] [address] Dept: [phone] Dept Fax: [phone]   No orders of the defined types were placed in this encounter.    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 Reactive gastropathy with intestinal metaplasia showing focal  dysplasia. Negative for H. Pylori -05/11/2024: [facility] NGS:  CLDN18: Positive, PD-L1: Positive, CPS: 10, HER2: Negative   -MSI: Stable, MMR: Proficient, TMB: Low, 5 mut/Mb   -BRAF, RET, NTRK 1/2/3, ARID1A, BCR, CCN E1, CDH 1, EBER, EGFR,  FGFR2, KRAS, MET, PIK3CA, RHOA: Negative -05/19/2024: PET scan: Only slight area of asymmetric uptake along the midbody of the stomach with an adjacent metallic clip. No additional areas of abnormal uptake at this time.  -06/15/2024: EUS : Staging gastric adenocarcinoma at least T2 (maybe early T3) N0 Mx by EUS.  -06/23/2024: IR guided port placement -07/12/2024: Evaluated by surgical oncology at [facility]: Thought to be borderline surgical candidate and will re-evaluate after 4 cycles of chemo immunotherapy -07/21/2024: PET: Persistent mildwall thickening involving the gastric fundus and body with mild, non-specific tracer uptake (SUV max 2.5, previously 3.6). No hypermetabolic nodal metastasis or distant metastatic disease. -07/27/2024- Current: Neoadjuvant D-FLOT -09/22/2024: PET scan: No evidence of metabolically active disease related to gastric cancer.    Current Treatment: Neoadjuvant chemotherapy with D-FLOT  INTERVAL HISTORY:   Raymond Barrera is a 63 year old male who presents for follow-up for gastric adenocarcinoma.Patient is accompanied by his wife today.  Patient reports significantly feeling better and has no new complaints.  His skin rash has improved.  He has been recently gaining weight and appetite is better.  He has some nausea but is controlled with Zofran .  He met with Dr. Debora recently and was told he will get EUS at [facility] to assess for response to discuss further treatment options.    I have reviewed the past medical history, past surgical history, social history and family history with the patient and they are unchanged from previous note.  ALLERGIES:  is allergic to poison ivy extract [poison ivy extract] and  coreg  [carvedilol ].  MEDICATIONS:  Current Outpatient Medications  Medication Sig Dispense Refill   Accu-Chek Softclix Lancets lancets SMARTSIG:Topical 2-3 Times Daily     acetaminophen  (TYLENOL ) 325 MG tablet Take 2 tablets (650 mg total) by mouth every 6 (six) hours as needed for mild pain (pain score 1-3) (or Fever >/= 101).     albuterol  (VENTOLIN  HFA) 108 (90 Base) MCG/ACT inhaler Inhale 2 puffs into the lungs every 6 (six) hours as needed for wheezing or shortness of breath (cough). 8 g 2   amLODipine  (NORVASC ) 2.5 MG tablet Take 1 tablet (2.5 mg total) by mouth daily. 90 tablet 3   aspirin  EC 81 MG tablet Take 1 tablet (81 mg total) by mouth daily with breakfast. Swallow whole. 30 tablet 12   atorvastatin  (LIPITOR) 40 MG tablet Take 1 tablet (40 mg total) by mouth daily. 90 tablet 3   Camphor-Menthol -Methyl Sal (SALONPAS EX) Apply topically. As needed     cyanocobalamin  (VITAMIN B12) 1000 MCG tablet Take 1,000 mcg by mouth daily.     dexamethasone  (DECADRON ) 4 MG tablet Take 2 tablets (8 mg total) by mouth daily. Start the day after chemotherapy for 2 days. Take with food. 30 tablet 1   dextrose  5 % SOLN 1,000 mL with fluorouracil  5 GM/100ML SOLN Inject into the vein every 14 (fourteen) days.     diphenoxylate -atropine  (LOMOTIL ) 2.5-0.025 MG tablet Take 2 tablets by mouth 4 (four) times daily as needed for diarrhea or loose stools. 60 tablet 0   DOCETAXEL  IV Inject into the vein every 14 (fourteen) days.  DULoxetine  (CYMBALTA ) 30 MG capsule TAKE 1 CAPSULE(30 MG) BY MOUTH TWICE DAILY 60 capsule 3   Durvalumab  (IMFINZI  IV) Inject into the vein every 28 (twenty-eight) days.     ferrous sulfate  325 (65 FE) MG EC tablet Take 1 tablet (325 mg total) by mouth every Tuesday, Thursday, Saturday, and Sunday. 45 tablet 3   gabapentin  (NEURONTIN ) 300 MG capsule Take 1 capsule (300 mg total) by mouth at bedtime. 90 capsule 1   hydrOXYzine  (ATARAX ) 25 MG tablet TAKE 1 TABLET(25 MG) BY MOUTH THREE  TIMES DAILY AS NEEDED 30 tablet 0   JARDIANCE  10 MG TABS tablet Take 1 tablet (10 mg total) by mouth daily. 30 tablet 5   LEUCOVORIN  CALCIUM  IV Inject into the vein every 14 (fourteen) days.     lidocaine -prilocaine  (EMLA ) cream Apply to affected area once 30 g 3   lidocaine -prilocaine  (EMLA ) cream Apply 1 Application topically as needed. 30 g 1   ondansetron  (ZOFRAN ) 8 MG tablet Take 1 tablet (8 mg total) by mouth every 8 (eight) hours as needed for nausea or vomiting. Start on the third day after chemotherapy. 30 tablet 1   OXALIPLATIN  IV Inject into the vein every 14 (fourteen) days.     pantoprazole  (PROTONIX ) 40 MG tablet TAKE 1 TABLET(40 MG) BY MOUTH TWICE DAILY 60 tablet 2   polyethylene glycol powder (GLYCOLAX /MIRALAX ) 17 GM/SCOOP powder Take 8.5 g by mouth daily.     prochlorperazine  (COMPAZINE ) 10 MG tablet Take 1 tablet (10 mg total) by mouth every 6 (six) hours as needed for nausea or vomiting. 30 tablet 1   rOPINIRole  (REQUIP ) 2 MG tablet Take 1 tablet (2 mg total) by mouth at bedtime. 30 tablet 5   triamcinolone  cream (KENALOG ) 0.1 % Apply 1 Application topically 2 (two) times daily. 30 g 0   umeclidinium-vilanterol (ANORO ELLIPTA ) 62.5-25 MCG/ACT AEPB Inhale 1 puff into the lungs daily. 60 each 5   No current facility-administered medications for this visit.    REVIEW OF SYSTEMS:   Constitutional: Denies fevers, chills or abnormal weight loss Eyes: Denies blurriness of vision Ears, nose, mouth, throat, and face: Denies mucositis or sore throat Respiratory: Denies cough, dyspnea or wheezes Cardiovascular: Denies palpitation, chest discomfort or lower extremity swelling Gastrointestinal:  Denies heartburn or change in bowel habits Skin: Denies abnormal skin rashes Lymphatics: Denies new lymphadenopathy or easy bruising Neurological: Positive for numbness in hands, Denies numbness, tingling or new weaknesses All other systems were reviewed with the patient and are  negative.   VITALS:  There were no vitals taken for this visit.  Wt Readings from Last 3 Encounters:  09/27/24 220 lb (99.8 kg)  09/12/24 215 lb (97.5 kg)  09/12/24 215 lb 13.3 oz (97.9 kg)    There is no height or weight on file to calculate BMI.  Performance status (ECOG): 1 - Symptomatic but completely ambulatory  PHYSICAL EXAM:   GENERAL:alert, no distress and comfortable LYMPH:  no palpable lymphadenopathy in the cervical, axillary or inguinal LUNGS: clear to auscultation and percussion with normal breathing effort HEART: regular rate & rhythm and no murmurs and no lower extremity edema ABDOMEN:abdomen soft, non-tender and normal bowel sounds Musculoskeletal:no cyanosis of digits and no clubbing  NEURO: alert & oriented x 3 with fluent speech  LABORATORY DATA:  I have reviewed the data as listed    Component Value Date/Time   NA 139 09/27/2024 0758   NA 139 01/26/2024 1620   K 4.1 09/27/2024 0758   CL 104  09/27/2024 0758   CO2 27 09/27/2024 0758   GLUCOSE 110 (H) 09/27/2024 0758   BUN 10 09/27/2024 0758   BUN 28 (H) 01/26/2024 1620   CREATININE 1.48 (H) 09/27/2024 0758   CALCIUM  10.5 (H) 09/27/2024 0758   PROT 6.2 (L) 09/27/2024 0758   PROT 7.2 01/01/2024 1012   ALBUMIN  3.6 09/27/2024 0758   ALBUMIN  4.4 01/01/2024 1012   AST 20 09/27/2024 0758   ALT 16 09/27/2024 0758   ALKPHOS 130 (H) 09/27/2024 0758   BILITOT 0.5 09/27/2024 0758   BILITOT <0.2 01/01/2024 1012   GFRNONAA 52 (L) 09/27/2024 0758   GFRAA 40 (L) 04/12/2020 1425    Lab Results  Component Value Date   WBC 16.0 (H) 09/27/2024   NEUTROABS 13.3 (H) 09/27/2024   HGB 11.7 (L) 09/27/2024   HCT 37.9 (L) 09/27/2024   MCV 88.3 09/27/2024   PLT 164 09/27/2024      Chemistry      Component Value Date/Time   NA 139 09/27/2024 0758   NA 139 01/26/2024 1620   K 4.1 09/27/2024 0758   CL 104 09/27/2024 0758   CO2 27 09/27/2024 0758   BUN 10 09/27/2024 0758   BUN 28 (H) 01/26/2024 1620    CREATININE 1.48 (H) 09/27/2024 0758      Component Value Date/Time   CALCIUM  10.5 (H) 09/27/2024 0758   ALKPHOS 130 (H) 09/27/2024 0758   AST 20 09/27/2024 0758   ALT 16 09/27/2024 0758   BILITOT 0.5 09/27/2024 0758   BILITOT <0.2 01/01/2024 1012      Latest Reference Range & Units 09/27/24 07:58  TSH 0.350 - 4.500 uIU/mL 1.320    RADIOGRAPHIC STUDIES: I have personally reviewed the radiological images as listed and agreed with the findings in the report.  NM PET Image Restag (PS) Skull Base To Thigh EXAM: PET AND CT SKULL BASE TO MID THIGH 09/22/2024 12:13:30 PM  TECHNIQUE:  RADIOPHARMACEUTICAL: 10.7 mCi F-18 FDG Uptake time 60 minutes. Glucose level 116 mg/dl.  PET imaging was acquired from the base of the skull to the mid thighs. Non-contrast enhanced computed tomography was obtained for attenuation correction and anatomic localization.  COMPARISON: PET scan [id number].  CLINICAL HISTORY: Gastric cancer, assess treatment response. 10.70 mCi FDG/ RAC/ FMU HJP 1043; BG: 116 mg/dL; Gastric cancer, assess treatment response, Gastric adenocarcinoma ; NPO; DM; no insulin ; No SxBx; No Tx; EOV 09/12/24  FINDINGS:  HEAD AND NECK: No metabolically active cervical lymphadenopathy.  CHEST: No suspicious pulmonary nodules. No hypermetabolic mediastinal lymph nodes. No abnormal metabolic activity associated with the esophagus.  ABDOMEN AND PELVIS: No abnormal metabolic activity associated with the stomach. No hypermetabolic upper abdominal lymph nodes. No liver metastases. Post left nephrectomy. Physiologic activity within the gastrointestinal and genitourinary systems.  BONES AND SOFT TISSUE: Uniform uptake within the marrow space of the spine and pelvis is favored to represent GCSF response. No metabolically active aggressive osseous lesion. Hip prosthetics and posterior lumbar fusion. Benign lipoma in the left thigh.  IMPRESSION: 1. No evidence of metabolically active  disease related to gastric cancer. 2. Uniform diffuse marrow uptake likely related to G-CSF effect.  Electronically signed by: Norleen Boxer MD 09/24/2024 02:46 PM EDT RP Workstation: [id number]

## 2024-09-27 NOTE — Patient Instructions (Signed)
 [facility] [place] - A DEPT OF [facility]  Discharge Instructions: Thank you for choosing [facility] to provide your oncology and hematology care.  If you have a lab appointment with the Cancer Center - please note that after April 8th, 2024, all labs will be drawn in the cancer center.  You do not have to check in or register with the main entrance as you have in the past but will complete your check-in in the cancer center.  Wear comfortable clothing and clothing appropriate for easy access to any Portacath or PICC line.   We strive to give you quality time with your provider. You may need to reschedule your appointment if you arrive late (15 or more minutes).  Arriving late affects you and other patients whose appointments are after yours.  Also, if you miss three or more appointments without notifying the office, you may be dismissed from the clinic at the provider's discretion.      For prescription refill requests, have your pharmacy contact our office and allow 72 hours for refills to be completed.    Today you received the following chemotherapy and/or immunotherapy agents Imfinzi , Taxotere , Oxaliplatin , and Adruicil.       To help prevent nausea and vomiting after your treatment, we encourage you to take your nausea medication as directed.  BELOW ARE SYMPTOMS THAT SHOULD BE REPORTED IMMEDIATELY: *FEVER GREATER THAN 100.4 F (38 C) OR HIGHER *CHILLS OR SWEATING *NAUSEA AND VOMITING THAT IS NOT CONTROLLED WITH YOUR NAUSEA MEDICATION *UNUSUAL SHORTNESS OF BREATH *UNUSUAL BRUISING OR BLEEDING *URINARY PROBLEMS (pain or burning when urinating, or frequent urination) *BOWEL PROBLEMS (unusual diarrhea, constipation, pain near the anus) TENDERNESS IN MOUTH AND THROAT WITH OR WITHOUT PRESENCE OF ULCERS (sore throat, sores in mouth, or a toothache) UNUSUAL RASH, SWELLING OR PAIN  UNUSUAL VAGINAL DISCHARGE OR ITCHING   Items with * indicate a  potential emergency and should be followed up as soon as possible or go to the Emergency Department if any problems should occur.  Please show the CHEMOTHERAPY ALERT CARD or IMMUNOTHERAPY ALERT CARD at check-in to the Emergency Department and triage nurse.  Should you have questions after your visit or need to cancel or reschedule your appointment, please contact [facility]CER [facility] [place] - A DEPT OF [name] [phone]  and follow the prompts.  Office hours are 8:00 a.m. to 4:30 p.m. Monday - Friday. Please note that voicemails left after 4:00 p.m. may not be returned until the following business day.  We are closed weekends and major holidays. You have access to a nurse at all times for urgent questions. Please call the main number to the clinic [phone] and follow the prompts.  For any non-urgent questions, you may also contact your provider using MyChart. We now offer e-Visits for anyone 76 and older to request care online for non-urgent symptoms. For details visit [email or web address].   Also download the MyChart app! Go to the app store, search MyChart, open the app, select [place], and log in with your MyChart username and password.

## 2024-09-27 NOTE — Progress Notes (Signed)
 SPIRITUAL CARE AND COUNSELING CONSULT NOTE   VISIT SUMMARY   Reason for Visit: Chaplain identified Pt on the schedule as a Pt I had not connected with yet and visited to deliver introduction to Spiritual Care  Description of Visit: Upon arrival I found *Raymond Barrera seated in the recliner receiving treatment, and wife, [name], was there as a support person.  I introduced myself as the chaplain for the cancer center and offered a brief education on the role of a chaplain and the support we can offer to our patients, caregivers, and staff.     Orion has difficulty speaking due to stroke recovery so his wife [name] speaks for him.  She states that both of them are energetic and devout Christians [facility]) and their faith is the basis for all that they do and all the decisions they make.  [name] speaks with energy and love.   I do not currently assess that Georgette will struggle with care giving or support, but will continue to follow  Plan of Care: I will continue to follow up with [facility] on a monthly basis.   Maude Roll, MDiv  Chaplain, [facility] [email or web address] [phone]   SPIRITUAL ENCOUNTER                                                                                                                                                                      Type of Visit: Initial Care provided to:: Patient, Significant other (wife [name]) Referral source: Chaplain assessment Reason for visit:  (Introduction to Spiritual Care) OnCall Visit: No   SPIRITUAL FRAMEWORK  Presenting Themes: Meaning/purpose/sources of inspiration, Goals in life/care, Community and relationships Values/beliefs: Pt and Wife are deviout Christians [employer]) and their faith is an asset to them in this journey. Community/Connection: Family, Designer, multimedia, Faith community Strengths: Faith, Development Worker, International Aid, and Teamwork Needs/Challenges/Barriers: Pt working to overcome speech difficulties Patient Stress  Factors: None identified   GOALS   Self/Personal Goals: not discussed Clinical Care Goals: not discussed   INTERVENTIONS   Spiritual Care Interventions Made: Established relationship of care and support, Reflective listening, Encouragement    INTERVENTION OUTCOMES   Outcomes: Awareness around self/spiritual resourses, Awareness of support  SPIRITUAL CARE PLAN   Spiritual Care Issues Still Outstanding: Chaplain will continue to follow    Maude Roll, MDiv Chaplain, [facility] [email or web address] [phone]  09/27/2024 1:33 PM

## 2024-09-28 ENCOUNTER — Inpatient Hospital Stay

## 2024-09-28 VITALS — BP 146/98 | HR 88 | Temp 96.9°F | Resp 20

## 2024-09-28 DIAGNOSIS — C169 Malignant neoplasm of stomach, unspecified: Secondary | ICD-10-CM

## 2024-09-28 DIAGNOSIS — Z5111 Encounter for antineoplastic chemotherapy: Secondary | ICD-10-CM | POA: Diagnosis not present

## 2024-09-28 LAB — T4: T4, Total: 10.5 ug/dL (ref 4.5–12.0)

## 2024-09-28 MED ORDER — PEGFILGRASTIM-CBQV 6 MG/0.6ML ~~LOC~~ SOSY
6.0000 mg | PREFILLED_SYRINGE | Freq: Once | SUBCUTANEOUS | Status: AC
  Administered 2024-09-28: 6 mg via SUBCUTANEOUS
  Filled 2024-09-28: qty 0.6

## 2024-09-28 NOTE — Progress Notes (Signed)
 Patient tolerated Udenyca injection with no complaints voiced.  Site clean and dry with no bruising or swelling noted at site.  See MAR for details.  Band aid applied.  Patient stable during and after injection.  Vss with discharge and left in satisfactory condition with no s/s of distress noted. All follow ups as scheduled.   Attilio Zeitler [employer]

## 2024-09-28 NOTE — Progress Notes (Signed)
 Patient for chemotherapy pump disconnect with no complaints voiced.  Patients port flushed without difficulty.  Good blood return noted with no bruising or swelling noted at site.  Band aid applied.  VSS with discharge and left ambulatory with no s/s of distress noted.

## 2024-09-28 NOTE — Patient Instructions (Signed)

## 2024-10-03 ENCOUNTER — Telehealth: Payer: Self-pay | Admitting: Dietician

## 2024-10-03 ENCOUNTER — Ambulatory Visit: Payer: Self-pay | Admitting: *Deleted

## 2024-10-03 ENCOUNTER — Inpatient Hospital Stay: Attending: Oncology | Admitting: Dietician

## 2024-10-03 DIAGNOSIS — R630 Anorexia: Secondary | ICD-10-CM | POA: Insufficient documentation

## 2024-10-03 DIAGNOSIS — C169 Malignant neoplasm of stomach, unspecified: Secondary | ICD-10-CM | POA: Insufficient documentation

## 2024-10-03 DIAGNOSIS — R634 Abnormal weight loss: Secondary | ICD-10-CM | POA: Insufficient documentation

## 2024-10-03 DIAGNOSIS — Z79630 Long term (current) use of alkylating agent: Secondary | ICD-10-CM | POA: Insufficient documentation

## 2024-10-03 DIAGNOSIS — Z79631 Long term (current) use of antimetabolite agent: Secondary | ICD-10-CM | POA: Insufficient documentation

## 2024-10-03 DIAGNOSIS — Z5111 Encounter for antineoplastic chemotherapy: Secondary | ICD-10-CM | POA: Insufficient documentation

## 2024-10-03 NOTE — Telephone Encounter (Addendum)
 FYI Only or Action Required?: FYI only for provider: Patient advised UC by oncology office.  Patient was last seen in primary care on 08/03/2024 by [name], MD.  Called Nurse Triage reporting Dizziness.  Symptoms began several months ago.- seems to be getting worse  Interventions attempted: Rest, hydration, or home remedies.  Symptoms are: gradually worsening.  Triage Disposition: See Physician Within 24 Hours  Patient/caregiver understands and will follow disposition?: Yes  System failure disconnected patient call. On call back- patient's wife states she has spoken with [facility] and has been advised UC. She will see how patient feels when he gets up from nap and move forward from there.   Copied from CRM [id number]. Topic: Clinical - Red Word Triage >> Oct 03, 2024  1:41 PM Ahlexyia S wrote: Red Word that prompted transfer to Nurse Triage: Pt wife Diane called in stating that pt has been experiencing frequent dizziness and imbalance. Diane isn't sure if its the pt chemo therapy or the medications he has been taking. Warm transferred to nurse triage. Reason for Disposition  [1] MODERATE dizziness (e.g., interferes with normal activities) AND [2] has NOT been evaluated by doctor (or NP/PA) for this  (Exception: Dizziness caused by heat exposure, sudden standing, or poor fluid intake.)  Answer Assessment - Initial Assessment Questions 1. DESCRIPTION: Describe your dizziness.     Imbalance, dizziness 2. LIGHTHEADED: Do you feel lightheaded? (e.g., somewhat faint, woozy, weak upon standing)     woozy 3. VERTIGO: Do you feel like either you or the room is spinning or tilting? (i.e., vertigo)     no 4. SEVERITY: How bad is it?  Do you feel like you are going to faint? Can you stand and walk?     Patient has to used cane for walking 5. ONSET:  When did the dizziness begin?     Chemo started August- started a little after 6. AGGRAVATING FACTORS: Does anything make it  worse? (e.g., standing, change in head position)     No- has dizziness when sitting also 7. HEART RATE: Can you tell me your heart rate? How many beats in 15 seconds?  (Note: Not all patients can do this.)       BP 143/85, P 75-90 8. CAUSE: What do you think is causing the dizziness? (e.g., decreased fluids or food, diarrhea, emotional distress, heat exposure, new medicine, sudden standing, vomiting; unknown)     Recent treatment vs BP 9. RECURRENT SYMPTOM: Have you had dizziness before? If Yes, ask: When was the last time? What happened that time?     Only rare- usually related to medication 10. OTHER SYMPTOMS: Do you have any other symptoms? (e.g., fever, chest pain, vomiting, diarrhea, bleeding)       no  Protocols used: Dizziness - Lightheadedness-A-AH

## 2024-10-03 NOTE — Telephone Encounter (Signed)
 Nutrition Follow-up:  Pt with gastric cancer. He receiving neoadjuvant D-FLOT Patient is under the care of Dr. Davonna Sermon with patient and wife via telephone for nutrition follow-up. Patient waking up at time of call, but reports feeling good this morning. Wife says appetite varies. He will eat well one day and not want anything the next day. Patient is not drinking Ensure regularly. He denies nausea, vomiting, diarrhea, constipation.    Medications: reviewed   Labs: 10/28 - glucose 110, Cr 1.48, Ca 10.5  Anthropometrics: Wt 220 lb on 10/28 - increased   10/13 - 215 lb  9/24 - 228 lb  9/10 - 235 lb 3.2 oz    NUTRITION DIAGNOSIS: Food and nutrition related knowledge deficit improving    INTERVENTION:  Encourage pt to drink Ensure HP on days when appetite is poor  Encourage high calorie high protein foods    MONITORING, EVALUATION, GOAL: wt trends, intake    NEXT VISIT: Monday December 1 via telephone

## 2024-10-03 NOTE — Telephone Encounter (Signed)
 FYI Only or Action Required?: FYI only for provider: UC .  Patient was last seen in primary care on 08/03/2024 by [name], MD.  Called Nurse Triage reporting Dizziness.  Symptoms began about a month ago.  Interventions attempted: Nothing.  Symptoms are: unchanged.  Triage Disposition: See Physician Within 24 Hours  Patient/caregiver understands and will follow disposition?:    Denies feeling faint/ passing out, numbness/ weakness, blurred vision, HA.  No available appts today. Advised UC now and ED/911 if symptoms worsen.  [name] reports will take to the nearest UC now.  [name] requesting appt in clinic and call back.Raymond Barrera

## 2024-10-04 ENCOUNTER — Telehealth: Payer: Self-pay | Admitting: *Deleted

## 2024-10-04 NOTE — Telephone Encounter (Addendum)
 Wife called and patient has had ongoing dizziness and had discussed with Dr. Davonna, which I don't see in her note. She questioned taking him off of BP meds, but his blood pressures indicate he needs his Norvasc . Per Delon Hope will offer labs and possible fluids for today.  Wife declined visit today due to another appointment for herself.  Stated she would call back to schedule for tomorrow.  Attempted to call multiple times with no answer.

## 2024-10-11 ENCOUNTER — Inpatient Hospital Stay

## 2024-10-11 ENCOUNTER — Ambulatory Visit: Payer: Self-pay

## 2024-10-11 VITALS — BP 152/99 | HR 89 | Temp 96.6°F | Resp 18

## 2024-10-11 DIAGNOSIS — Z5111 Encounter for antineoplastic chemotherapy: Secondary | ICD-10-CM | POA: Diagnosis present

## 2024-10-11 DIAGNOSIS — Z79631 Long term (current) use of antimetabolite agent: Secondary | ICD-10-CM | POA: Diagnosis not present

## 2024-10-11 DIAGNOSIS — R630 Anorexia: Secondary | ICD-10-CM | POA: Diagnosis not present

## 2024-10-11 DIAGNOSIS — C169 Malignant neoplasm of stomach, unspecified: Secondary | ICD-10-CM

## 2024-10-11 DIAGNOSIS — R634 Abnormal weight loss: Secondary | ICD-10-CM | POA: Diagnosis not present

## 2024-10-11 DIAGNOSIS — Z79630 Long term (current) use of alkylating agent: Secondary | ICD-10-CM | POA: Diagnosis not present

## 2024-10-11 LAB — COMPREHENSIVE METABOLIC PANEL WITH GFR
ALT: 23 U/L (ref 0–44)
AST: 26 U/L (ref 15–41)
Albumin: 3.9 g/dL (ref 3.5–5.0)
Alkaline Phosphatase: 135 U/L — ABNORMAL HIGH (ref 38–126)
Anion gap: 9 (ref 5–15)
BUN: 13 mg/dL (ref 8–23)
CO2: 26 mmol/L (ref 22–32)
Calcium: 10.8 mg/dL — ABNORMAL HIGH (ref 8.9–10.3)
Chloride: 103 mmol/L (ref 98–111)
Creatinine, Ser: 1.74 mg/dL — ABNORMAL HIGH (ref 0.61–1.24)
GFR, Estimated: 43 mL/min — ABNORMAL LOW (ref >60)
Glucose, Bld: 97 mg/dL (ref 70–99)
Potassium: 4.4 mmol/L (ref 3.5–5.1)
Sodium: 139 mmol/L (ref 135–145)
Total Bilirubin: 0.5 mg/dL (ref 0.0–1.2)
Total Protein: 6.6 g/dL (ref 6.5–8.1)

## 2024-10-11 LAB — CBC WITH DIFFERENTIAL/PLATELET
Abs Immature Granulocytes: 0.37 K/uL — ABNORMAL HIGH (ref 0.00–0.07)
Basophils Absolute: 0 K/uL (ref 0.0–0.1)
Basophils Relative: 0 %
Eosinophils Absolute: 0 K/uL (ref 0.0–0.5)
Eosinophils Relative: 0 %
HCT: 40.7 % (ref 39.0–52.0)
Hemoglobin: 12.3 g/dL — ABNORMAL LOW (ref 13.0–17.0)
Immature Granulocytes: 3 %
Lymphocytes Relative: 11 %
Lymphs Abs: 1.4 K/uL (ref 0.7–4.0)
MCH: 26.4 pg (ref 26.0–34.0)
MCHC: 30.2 g/dL (ref 30.0–36.0)
MCV: 87.3 fL (ref 80.0–100.0)
Monocytes Absolute: 1.1 K/uL — ABNORMAL HIGH (ref 0.1–1.0)
Monocytes Relative: 9 %
Neutro Abs: 9.8 K/uL — ABNORMAL HIGH (ref 1.7–7.7)
Neutrophils Relative %: 77 %
Platelets: 218 K/uL (ref 150–400)
RBC: 4.66 MIL/uL (ref 4.22–5.81)
RDW: 20.1 % — ABNORMAL HIGH (ref 11.5–15.5)
WBC: 12.7 K/uL — ABNORMAL HIGH (ref 4.0–10.5)
nRBC: 0.6 % — ABNORMAL HIGH (ref 0.0–0.2)

## 2024-10-11 LAB — MAGNESIUM: Magnesium: 1.8 mg/dL (ref 1.7–2.4)

## 2024-10-11 MED ORDER — OXALIPLATIN CHEMO INJECTION 100 MG/20ML
85.0000 mg/m2 | Freq: Once | INTRAVENOUS | Status: AC
  Administered 2024-10-11: 200 mg via INTRAVENOUS
  Filled 2024-10-11: qty 40

## 2024-10-11 MED ORDER — SODIUM CHLORIDE 0.9 % IV SOLN
50.0000 mg/m2 | Freq: Once | INTRAVENOUS | Status: AC
  Administered 2024-10-11: 122 mg via INTRAVENOUS
  Filled 2024-10-11: qty 12.2

## 2024-10-11 MED ORDER — SODIUM CHLORIDE 0.9 % IV SOLN
25.0000 mg | Freq: Once | INTRAVENOUS | Status: AC
  Administered 2024-10-11: 25 mg via INTRAVENOUS
  Filled 2024-10-11: qty 1

## 2024-10-11 MED ORDER — SODIUM CHLORIDE 0.9 % IV SOLN
2600.0000 mg/m2 | INTRAVENOUS | Status: DC
  Administered 2024-10-11: 6200 mg via INTRAVENOUS
  Filled 2024-10-11: qty 124

## 2024-10-11 MED ORDER — DEXAMETHASONE SOD PHOSPHATE PF 10 MG/ML IJ SOLN
10.0000 mg | Freq: Once | INTRAMUSCULAR | Status: AC
  Administered 2024-10-11: 10 mg via INTRAVENOUS

## 2024-10-11 MED ORDER — LORAZEPAM 2 MG/ML IJ SOLN
0.5000 mg | Freq: Once | INTRAMUSCULAR | Status: AC
  Administered 2024-10-11: 0.5 mg via INTRAVENOUS
  Filled 2024-10-11: qty 1

## 2024-10-11 MED ORDER — PALONOSETRON HCL INJECTION 0.25 MG/5ML
0.2500 mg | Freq: Once | INTRAVENOUS | Status: AC
  Administered 2024-10-11: 0.25 mg via INTRAVENOUS
  Filled 2024-10-11: qty 5

## 2024-10-11 MED ORDER — DEXTROSE 5 % IV SOLN: INTRAVENOUS | Status: DC

## 2024-10-11 MED ORDER — LEUCOVORIN CALCIUM INJECTION 350 MG
200.0000 mg/m2 | Freq: Once | INTRAVENOUS | Status: AC
  Administered 2024-10-11: 488 mg via INTRAVENOUS
  Filled 2024-10-11: qty 24.4

## 2024-10-11 MED ORDER — SODIUM CHLORIDE 0.9 % IV SOLN: Freq: Once | INTRAVENOUS | Status: AC

## 2024-10-11 NOTE — Telephone Encounter (Signed)
 FYI Only or Action Required?: FYI only for provider: appointment scheduled on 10/12/24.  Patient was last seen in primary care on 08/03/2024 by [name], MD.  Called Nurse Triage reporting Leg Pain.  Symptoms began today.  Interventions attempted: Nothing.  Symptoms are: gradually worsening.  Triage Disposition: See PCP When Office is Open (Within 3 Days)  Patient/caregiver understands and will follow disposition?: Yes     Copied from CRM #[id number]. Topic: Clinical - Medical Advice >> Oct 11, 2024 11:43 AM Charlet HERO wrote: Reason for CRM: Patient is calling about restless leg syndrome stating that he is at the cancer center across the street. Would like medical advice  Reason for Disposition  [1] MODERATE pain (e.g., interferes with normal activities, limping) AND [2] present > 3 days  Answer Assessment - Initial Assessment Questions Speaking to patient's wife due to he is currently receiving treatment at the [facility]. She is asking is there something he can take right now for the pain in his legs caused by restless leg syndrome. She says he is tall and his legs hang off the chair, so he is very uncomfortable when he has to sit there for the treatment. She mentioned he fell yesterday in grass on his side while walking from a friend's house, loss of balance, no injury, was able to get up on his own. She says that he is unstable when walking and it's getting worse, reported multiple falls in the home, no injury. She stated they had to use a wheelchair to get him into the Cancer Center due to increased weakness. Advised OV, she agrees but says they have to arrange transportation. Advised no availability with PCP, scheduled for tomorrow with Dr. Bluford. Advised patient's wife to let the nurse know at the [facility] that the patient is having pain in the legs and ask if anything can be given. She verbalized understanding.   1. ONSET: When did the pain start?      Since 0730 this  morning   2. LOCATION: Where is the pain located?      Both legs entire 3. PAIN: How bad is the pain?    (Scale 1-10; or mild, moderate, severe)     9  5. CAUSE: What do you think is causing the leg pain?     Restless legs  6. OTHER SYMPTOMS: Do you have any other symptoms? (e.g., chest pain, back pain, breathing difficulty, swelling, rash, fever, numbness, weakness)     Nausea-medication given, no appetite, weakness  Answer Assessment - Initial Assessment Questions 1. MECHANISM: How did the fall happen?     Loss of balance walking, fell in the grass 2. DOMESTIC VIOLENCE AND ELDER ABUSE SCREENING: Did you fall because someone pushed you or tried to hurt you? If Yes, ask: Are you safe now?     No 3. ONSET: When did the fall happen? (e.g., minutes, hours, or days ago)     Yesterday 4. LOCATION: What part of the body hit the ground? (e.g., back, buttocks, head, hips, knees, hands, head, stomach)     Right side 5. INJURY: Did you hurt (injure) yourself when you fell? If Yes, ask: What did you injure? Tell me more about this? (e.g., body area; type of injury; pain severity)     No 6. PAIN: Is there any pain? If Yes, ask: How bad is the pain? (e.g., Scale 0-10; or none, mild,      0 7. SIZE: For cuts, bruises, or swelling,  ask: How large is it? (e.g., inches or centimeters)      None 8. OTHER SYMPTOMS: Do you have any other symptoms? (e.g., dizziness, fever, weakness; new-onset or worsening).      Weakness, unsteadiness 10. CAUSE: What do you think caused the fall (or falling)? (e.g., dizzy spell, tripped)       Legs gave out  Protocols used: Leg Pain-A-AH, Falls and Falling-A-AH

## 2024-10-11 NOTE — Patient Instructions (Signed)
 [facility] [place] - A DEPT OF [facility]  Discharge Instructions: Thank you for choosing [name] to provide your oncology and hematology care.  If you have a lab appointment with the Cancer Center - please note that after April 8th, 2024, all labs will be drawn in the cancer center.  You do not have to check in or register with the main entrance as you have in the past but will complete your check-in in the cancer center.  Wear comfortable clothing and clothing appropriate for easy access to any Portacath or PICC line.   We strive to give you quality time with your provider. You may need to reschedule your appointment if you arrive late (15 or more minutes).  Arriving late affects you and other patients whose appointments are after yours.  Also, if you miss three or more appointments without notifying the office, you may be dismissed from the clinic at the provider's discretion.      For prescription refill requests, have your pharmacy contact our office and allow 72 hours for refills to be completed.    Today you received the following chemotherapy and/or immunotherapy agents Taxotere  oxaliplatin  leucovorin  5FU     To help prevent nausea and vomiting after your treatment, we encourage you to take your nausea medication as directed.  BELOW ARE SYMPTOMS THAT SHOULD BE REPORTED IMMEDIATELY: *FEVER GREATER THAN 100.4 F (38 C) OR HIGHER *CHILLS OR SWEATING *NAUSEA AND VOMITING THAT IS NOT CONTROLLED WITH YOUR NAUSEA MEDICATION *UNUSUAL SHORTNESS OF BREATH *UNUSUAL BRUISING OR BLEEDING *URINARY PROBLEMS (pain or burning when urinating, or frequent urination) *BOWEL PROBLEMS (unusual diarrhea, constipation, pain near the anus) TENDERNESS IN MOUTH AND THROAT WITH OR WITHOUT PRESENCE OF ULCERS (sore throat, sores in mouth, or a toothache) UNUSUAL RASH, SWELLING OR PAIN  UNUSUAL VAGINAL DISCHARGE OR ITCHING   Items with * indicate a potential emergency  and should be followed up as soon as possible or go to the Emergency Department if any problems should occur.  Please show the CHEMOTHERAPY ALERT CARD or IMMUNOTHERAPY ALERT CARD at check-in to the Emergency Department and triage nurse.  Should you have questions after your visit or need to cancel or reschedule your appointment, please contact [facility] [place] - A DEPT OF [name] [phone]  and follow the prompts.  Office hours are 8:00 a.m. to 4:30 p.m. Monday - Friday. Please note that voicemails left after 4:00 p.m. may not be returned until the following business day.  We are closed weekends and major holidays. You have access to a nurse at all times for urgent questions. Please call the main number to the clinic [phone] and follow the prompts.  For any non-urgent questions, you may also contact your provider using MyChart. We now offer e-Visits for anyone 80 and older to request care online for non-urgent symptoms. For details visit [email or web address].   Also download the MyChart app! Go to the app store, search MyChart, open the app, select [place], and log in with your MyChart username and password.

## 2024-10-11 NOTE — Telephone Encounter (Signed)
 Wife advised.

## 2024-10-11 NOTE — Progress Notes (Signed)
 Patient presents today for FLOT infusion with 5FU pump start per providers order.  Vital signs and labs within parameters for treatment.  Patient receive 500 cc bolus NS with treatment due to Calcium  of 10.8.  Patient has no new complaints at this time.    Treatment given today per MD orders.  Tolerated infusion without adverse affects.  Vital signs stable.  5FU pump connected and verified RUN on the screen with the patient.  No complaints at this time.  Discharge from clinic ambulatory in stable condition.  Alert and oriented X 3.  Follow up with [facility] as scheduled.

## 2024-10-12 ENCOUNTER — Ambulatory Visit: Admitting: Family Medicine

## 2024-10-12 ENCOUNTER — Inpatient Hospital Stay

## 2024-10-12 VITALS — BP 107/83 | HR 105 | Temp 98.1°F | Resp 20

## 2024-10-12 DIAGNOSIS — C169 Malignant neoplasm of stomach, unspecified: Secondary | ICD-10-CM

## 2024-10-12 NOTE — Patient Instructions (Signed)

## 2024-10-12 NOTE — Progress Notes (Signed)
 Patient for chemotherapy pump disconnect with no complaints voiced.  Patients port flushed without difficulty.  Good blood return noted with no bruising or swelling noted at site.  Band aid applied.  VSS with discharge and left ambulatory with no s/s of distress noted.

## 2024-10-13 ENCOUNTER — Encounter: Payer: Self-pay | Admitting: Oncology

## 2024-10-13 ENCOUNTER — Encounter (INDEPENDENT_AMBULATORY_CARE_PROVIDER_SITE_OTHER): Payer: Self-pay | Admitting: Gastroenterology

## 2024-10-13 ENCOUNTER — Telehealth: Payer: Self-pay

## 2024-10-13 NOTE — Telephone Encounter (Signed)
 Notified patients wife, his caregiver that he did not receive his GCSF injection as ordered. Dr. Davonna, and pharmacy aware and per MD patient can skip the injection for this cycle. All questions answered , will follow up as scheduled.

## 2024-10-13 NOTE — Anesthesia Postprocedure Evaluation (Signed)
 Patient: Raymond Barrera.  Procedure Summary     Date: 10/13/24 Room / Location: [facility] - ENDOSCOPY   Anesthesia Start: [phone] Anesthesia Stop: [phone]   Procedure: UPPER ENDOSCOPIC ULTRASOUND Diagnosis: Malignant neoplasm of body of stomach    (CMD)   Scheduled Providers: Lonni Phillips, MD; Alm Felecia Ast, CRNA; Almarie Earnie Shu, MD Responsible Provider: Almarie Earnie Shu, MD   Anesthesia Type: general ASA Status: 3       Anesthesia Type: general  Vitals Value Taken Time  BP 155/100 10/13/24 10:15  Temp 97.6 F (36.4 C) 10/13/24 10:00  Pulse 92 10/13/24 10:17  Resp 19 10/13/24 10:17  SpO2 100 % 10/13/24 10:17  Vitals shown include unfiled device data.  No notable events documented.  Anesthesia Post Evaluation  Final anesthesia type: general Patient location during evaluation: GI Patient participation: Patient participated Level of consciousness: awake and alert Pain score: pain well controlled (patient comfortable/resting) Pain management: adequately controlled during entire PACU stay Post-op nausea and vomiting?: none Post-op vital signs: post-procedure vital signs are stable Patient temperature: Normothermic Cardiovascular status: hemodynamically stable Respiratory status: Stable, room air, spontaneous Hydration status: adequately hydrated Post-op disposition: Home Anesthesia post-op complications?:no complications

## 2024-10-19 ENCOUNTER — Other Ambulatory Visit: Payer: Self-pay | Admitting: Oncology

## 2024-10-19 DIAGNOSIS — C169 Malignant neoplasm of stomach, unspecified: Secondary | ICD-10-CM

## 2024-10-24 ENCOUNTER — Encounter: Payer: Self-pay | Admitting: Oncology

## 2024-10-25 ENCOUNTER — Inpatient Hospital Stay

## 2024-10-25 ENCOUNTER — Inpatient Hospital Stay (HOSPITAL_BASED_OUTPATIENT_CLINIC_OR_DEPARTMENT_OTHER): Admitting: Oncology

## 2024-10-25 DIAGNOSIS — R634 Abnormal weight loss: Secondary | ICD-10-CM

## 2024-10-25 DIAGNOSIS — Z720 Tobacco use: Secondary | ICD-10-CM

## 2024-10-25 DIAGNOSIS — E861 Hypovolemia: Secondary | ICD-10-CM | POA: Diagnosis not present

## 2024-10-25 DIAGNOSIS — C169 Malignant neoplasm of stomach, unspecified: Secondary | ICD-10-CM | POA: Diagnosis not present

## 2024-10-25 DIAGNOSIS — D500 Iron deficiency anemia secondary to blood loss (chronic): Secondary | ICD-10-CM

## 2024-10-25 DIAGNOSIS — Z5111 Encounter for antineoplastic chemotherapy: Secondary | ICD-10-CM | POA: Diagnosis not present

## 2024-10-25 DIAGNOSIS — G6289 Other specified polyneuropathies: Secondary | ICD-10-CM

## 2024-10-25 LAB — CBC WITH DIFFERENTIAL/PLATELET
Abs Immature Granulocytes: 0.02 K/uL (ref 0.00–0.07)
Basophils Absolute: 0 K/uL (ref 0.0–0.1)
Basophils Relative: 1 %
Eosinophils Absolute: 0 K/uL (ref 0.0–0.5)
Eosinophils Relative: 0 %
HCT: 37.5 % — ABNORMAL LOW (ref 39.0–52.0)
Hemoglobin: 11.6 g/dL — ABNORMAL LOW (ref 13.0–17.0)
Immature Granulocytes: 1 %
Lymphocytes Relative: 33 %
Lymphs Abs: 1.1 K/uL (ref 0.7–4.0)
MCH: 26.5 pg (ref 26.0–34.0)
MCHC: 30.9 g/dL (ref 30.0–36.0)
MCV: 85.6 fL (ref 80.0–100.0)
Monocytes Absolute: 0.9 K/uL (ref 0.1–1.0)
Monocytes Relative: 28 %
Neutro Abs: 1.3 K/uL — ABNORMAL LOW (ref 1.7–7.7)
Neutrophils Relative %: 37 %
Platelets: 284 K/uL (ref 150–400)
RBC: 4.38 MIL/uL (ref 4.22–5.81)
RDW: 19.7 % — ABNORMAL HIGH (ref 11.5–15.5)
WBC: 3.4 K/uL — ABNORMAL LOW (ref 4.0–10.5)
nRBC: 0 % (ref 0.0–0.2)

## 2024-10-25 LAB — COMPREHENSIVE METABOLIC PANEL WITH GFR
ALT: 15 U/L (ref 0–44)
AST: 21 U/L (ref 15–41)
Albumin: 3.7 g/dL (ref 3.5–5.0)
Alkaline Phosphatase: 87 U/L (ref 38–126)
Anion gap: 8 (ref 5–15)
BUN: 13 mg/dL (ref 8–23)
CO2: 26 mmol/L (ref 22–32)
Calcium: 10.3 mg/dL (ref 8.9–10.3)
Chloride: 105 mmol/L (ref 98–111)
Creatinine, Ser: 1.41 mg/dL — ABNORMAL HIGH (ref 0.61–1.24)
GFR, Estimated: 55 mL/min — ABNORMAL LOW (ref >60)
Glucose, Bld: 97 mg/dL (ref 70–99)
Potassium: 3.8 mmol/L (ref 3.5–5.1)
Sodium: 138 mmol/L (ref 135–145)
Total Bilirubin: 0.6 mg/dL (ref 0.0–1.2)
Total Protein: 6.2 g/dL — ABNORMAL LOW (ref 6.5–8.1)

## 2024-10-25 LAB — TSH: TSH: 2.34 u[IU]/mL (ref 0.350–4.500)

## 2024-10-25 LAB — MAGNESIUM: Magnesium: 1.7 mg/dL (ref 1.7–2.4)

## 2024-10-25 MED ORDER — SODIUM CHLORIDE 0.9 % IV SOLN: INTRAVENOUS | Status: DC

## 2024-10-25 NOTE — Progress Notes (Signed)
 Patient Care Team: [name], MD as PCP - General (Internal Medicine) Francyne Headland, MD as PCP - Cardiology (Cardiology) Beuford Anes, MD as Consulting Physician (Orthopedic Surgery) Devere Lonni Righter, MD as Consulting Physician (Urology) Skeet Juliene SAUNDERS, DO as Consulting Physician (Neurology)  Clinic Day:  10/25/2024  Referring physician: [name], MD   CHIEF COMPLAINT:  CC: Gastric adenocarcinoma    ASSESSMENT & PLAN:   Assessment & Plan: Raymond Barrera.  is a 63 y.o. 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   Oncology history as below Likely stage I-T2N0M0 (T2-T3 per EUS report) PET scan with no evidence of metastatic disease Evaluated by surgical oncology at [facility] and recommended to follow-up after neoadjuvant chemoimmunotherapy 07/27/2024: Started on D-FLOT regimen per [facility] trial    - C7D1 today.  Patient reported feeling not very good today.  He has lost significant weight and has no appetite.  Would defer chemotherapy at this time.  Will get everything scheduled for next week. -We reviewed the EUS findings together.  There is slight decrease in the size of the ulcer. - He is scheduled to see Dr. Debora at [facility] on 11/04/2024.   - Labs reviewed today: Creatinine: 1.41-stable, LFTs WNL.  CBC: WBC: 3.4, hemoglobin: 11.6, platelets: 284.  TSH: 2.340 -Will hold chemotherapy today at patient's preference.  Will reschedule for infusion in 1 week. -Patient also has a radiation oncology consult at [facility] on 11/02/2024.   Return to clinic in 3 weeks prior to next cycle of chemotherapy  Hypotension Blood pressure of 94/71 mmHg today  - Will administer 1 L normal saline for symptomatic management.  Cancer-related weight loss and poor appetite Significant weight loss and poor appetite during  chemotherapy. Discussed potential feeding tube if weight loss persists, though not preferred by him.  - Monitor weight closely.  - Consider feeding tube if weight loss continues.  Chemotherapy-induced skin changes Peeling skin on hands and hyperpigmentation on feet, likely chemotherapy-related. Resolved at this time.  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  Numbness and tingling in hands remain unchanged.  Chemotherapy-induced nausea and diarrhea Mild nausea and occasional diarrhea post-treatment, no vomiting.  -As needed Zofran  and Imodium are available for the patient.   Tobacco use Patient is a chronic smoker and is currently smoking 1 to 2 cigarettes/day   - Encouraged his attempts to abstain from smoking  Chronic kidney disease stage III Chronic kidney disease likely secondary to only 1 functioning kidney  Follows with Dr. 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   - Currently in remission   The patient understands the plans discussed today and is in agreement with them.  He knows to contact our office if he develops concerns prior to his next appointment.  The total time spent in the appointment was 27 minutes for the encounter with patient, including review of chart and various tests results, discussions about plan of care and coordination of care plan   I,Helena R Teague,acting as a scribe for [name], MD.,have documented all relevant documentation on the behalf of [name], MD,as directed by  [name], MD while in the presence of [name], MD.  I, [name]  MD, have reviewed the above documentation for accuracy and completeness, and I agree with the above.     [name], MD  [facility] - A DEPT OF [name] [address] [place] [address] Dept: [phone] Dept Fax: [phone]   No orders of the defined types were placed in this encounter.    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 Reactive gastropathy with intestinal metaplasia showing focal dysplasia. Negative for H. Pylori -05/11/2024: [facility] NGS:  CLDN18: Positive, PD-L1: Positive, CPS: 10, HER2: Negative   -MSI: Stable, MMR: Proficient, TMB: Low, 5 mut/Mb   -BRAF, RET, NTRK 1/2/3, ARID1A, BCR, CCN E1, CDH 1, EBER, EGFR,  FGFR2, KRAS, MET, PIK3CA, RHOA: Negative -05/19/2024: PET scan: Only slight area of asymmetric uptake along the midbody of the stomach with an adjacent metallic clip. No additional areas of abnormal uptake at this time.  -06/15/2024: EUS : Staging gastric adenocarcinoma at least T2 (maybe early T3) N0 Mx by EUS.  -06/23/2024: IR guided port placement -07/12/2024: Evaluated by surgical oncology at [facility]: Thought to be borderline surgical candidate and will re-evaluate after 4 cycles of chemo immunotherapy -07/21/2024: PET: Persistent mildwall thickening involving the gastric fundus and body with mild, non-specific tracer uptake (SUV max 2.5, previously 3.6). No hypermetabolic nodal metastasis or distant metastatic disease. -07/27/2024- Current: Neoadjuvant D-FLOT -09/22/2024: PET scan: No evidence of metabolically active disease related to gastric cancer.  -10/13/2024: EUS: Superficial ulcer in the body of the stomach and lesser curve of the  Stomach. There is no adenopathy and the ulcer measures 1.5cm by EUS, probable T2.   Current Treatment: Neoadjuvant chemotherapy with D-FLOT  INTERVAL HISTORY:   Raymond Barrera is a 63 year old male who presents for follow-up for gastric adenocarcinoma.Patient is  accompanied by his wife today.  He reports a loss of appetite and unintentional weight loss attributable to loss of taste and decreased oral intake. He also has associated generalized weakness. His wife states he feels as if he has a cold and that he rests most of the day.   Zebbie has low blood pressure and mild anemia today. We will give fluids.   His chemotherapy-induced rash has improved with cocoa butter and aquaphor. His nausea has improved with Zofran , though he still reports diarrhea. Aarsh has residual peripheral neuropathy from his diabetes in the form of mild numbness and tingling in his bilateral hands.   He is seeing Dr. Debora on 11/04/2024 to discuss treatment options. Darrnell has a radiation consult on 11/02/2024 at [facility].   He is decreasing his smoking use to less than one cigarette a day.   I have reviewed the past medical history, past surgical history, social history and family history with the patient and they are unchanged from previous note.  ALLERGIES:  is allergic to poison ivy extract [poison ivy extract] and coreg  [carvedilol ].  MEDICATIONS:  Current Outpatient Medications  Medication Sig Dispense Refill   Accu-Chek Softclix Lancets lancets SMARTSIG:Topical 2-3 Times Daily     acetaminophen  (TYLENOL ) 325 MG tablet Take 2 tablets (650 mg total) by mouth every 6 (six) hours as needed for mild pain (pain score 1-3) (or Fever >/= 101).     albuterol  (VENTOLIN  HFA) 108 (90 Base) MCG/ACT inhaler Inhale 2 puffs into the lungs every 6 (six) hours as  needed for wheezing or shortness of breath (cough). 8 g 2   amLODipine  (NORVASC ) 2.5 MG tablet Take 1 tablet (2.5 mg total) by mouth daily. 90 tablet 3   aspirin  EC 81 MG tablet Take 1 tablet (81 mg total) by mouth daily with breakfast. Swallow whole. 30 tablet 12   atorvastatin  (LIPITOR) 40 MG tablet Take 1 tablet (40 mg total) by mouth daily. 90 tablet 3   Camphor-Menthol -Methyl Sal (SALONPAS EX) Apply  topically. As needed     cyanocobalamin  (VITAMIN B12) 1000 MCG tablet Take 1,000 mcg by mouth daily.     dexamethasone  (DECADRON ) 4 MG tablet Take 2 tablets (8 mg total) by mouth daily. Start the day after chemotherapy for 2 days. Take with food. 30 tablet 1   dextrose  5 % SOLN 1,000 mL with fluorouracil  5 GM/100ML SOLN Inject into the vein every 14 (fourteen) days.     diphenoxylate -atropine  (LOMOTIL ) 2.5-0.025 MG tablet Take 2 tablets by mouth 4 (four) times daily as needed for diarrhea or loose stools. 60 tablet 0   DOCETAXEL  IV Inject into the vein every 14 (fourteen) days.     DULoxetine  (CYMBALTA ) 30 MG capsule TAKE 1 CAPSULE(30 MG) BY MOUTH TWICE DAILY 60 capsule 3   Durvalumab  (IMFINZI  IV) Inject into the vein every 28 (twenty-eight) days.     ferrous sulfate  325 (65 FE) MG EC tablet Take 1 tablet (325 mg total) by mouth every Tuesday, Thursday, Saturday, and Sunday. 45 tablet 3   gabapentin  (NEURONTIN ) 300 MG capsule Take 1 capsule (300 mg total) by mouth at bedtime. 90 capsule 1   hydrOXYzine  (ATARAX ) 25 MG tablet TAKE 1 TABLET(25 MG) BY MOUTH THREE TIMES DAILY AS NEEDED 30 tablet 0   JARDIANCE  10 MG TABS tablet Take 1 tablet (10 mg total) by mouth daily. 30 tablet 5   LEUCOVORIN  CALCIUM  IV Inject into the vein every 14 (fourteen) days.     lidocaine -prilocaine  (EMLA ) cream Apply 1 Application topically as needed. 30 g 1   ondansetron  (ZOFRAN ) 8 MG tablet Take 1 tablet (8 mg total) by mouth every 8 (eight) hours as needed for nausea or vomiting. Start on the third day after chemotherapy. 30 tablet 1   OXALIPLATIN  IV Inject into the vein every 14 (fourteen) days.     pantoprazole  (PROTONIX ) 40 MG tablet TAKE 1 TABLET(40 MG) BY MOUTH TWICE DAILY 60 tablet 2   polyethylene glycol powder (GLYCOLAX /MIRALAX ) 17 GM/SCOOP powder Take 8.5 g by mouth daily.     prochlorperazine  (COMPAZINE ) 10 MG tablet Take 1 tablet (10 mg total) by mouth every 6 (six) hours as needed for nausea or vomiting. 30  tablet 1   rOPINIRole  (REQUIP ) 2 MG tablet Take 1 tablet (2 mg total) by mouth at bedtime. 30 tablet 5   triamcinolone  cream (KENALOG ) 0.1 % Apply 1 Application topically 2 (two) times daily. 30 g 0   umeclidinium-vilanterol (ANORO ELLIPTA ) 62.5-25 MCG/ACT AEPB Inhale 1 puff into the lungs daily. 60 each 5   No current facility-administered medications for this visit.    REVIEW OF SYSTEMS:   Constitutional: Denies fevers, chills or abnormal weight loss Eyes: Denies blurriness of vision Ears, nose, mouth, throat, and face: Denies mucositis or sore throat Respiratory: Denies cough, dyspnea or wheezes Cardiovascular: Denies palpitation, chest discomfort or lower extremity swelling Gastrointestinal:  Denies heartburn or change in bowel habits Skin: Denies abnormal skin rashes Lymphatics: Denies new lymphadenopathy or easy bruising Neurological: Positive for numbness in hands, Denies numbness, tingling or new  weaknesses All other systems were reviewed with the patient and are negative.   VITALS:    Performance status (ECOG): 1 - Symptomatic but completely ambulatory  PHYSICAL EXAM:   GENERAL:alert, no distress and comfortable LYMPH:  no palpable lymphadenopathy in the cervical, axillary or inguinal LUNGS: clear to auscultation and percussion with normal breathing effort HEART: regular rate & rhythm and no murmurs and no lower extremity edema ABDOMEN:abdomen soft, non-tender and normal bowel sounds Musculoskeletal:no cyanosis of digits and no clubbing  NEURO: alert & oriented x 3 with fluent speech  LABORATORY DATA:  I have reviewed the data as listed    Component Value Date/Time   NA 138 10/25/2024 0824   NA 139 01/26/2024 1620   K 3.8 10/25/2024 0824   CL 105 10/25/2024 0824   CO2 26 10/25/2024 0824   GLUCOSE 97 10/25/2024 0824   BUN 13 10/25/2024 0824   BUN 28 (H) 01/26/2024 1620   CREATININE 1.41 (H) 10/25/2024 0824   CALCIUM  10.3 10/25/2024 0824   PROT 6.2 (L)  10/25/2024 0824   PROT 7.2 01/01/2024 1012   ALBUMIN  3.7 10/25/2024 0824   ALBUMIN  4.4 01/01/2024 1012   AST 21 10/25/2024 0824   ALT 15 10/25/2024 0824   ALKPHOS 87 10/25/2024 0824   BILITOT 0.6 10/25/2024 0824   BILITOT <0.2 01/01/2024 1012   GFRNONAA 55 (L) 10/25/2024 0824   GFRAA 40 (L) 04/12/2020 1425    Lab Results  Component Value Date   WBC 3.4 (L) 10/25/2024   NEUTROABS 1.3 (L) 10/25/2024   HGB 11.6 (L) 10/25/2024   HCT 37.5 (L) 10/25/2024   MCV 85.6 10/25/2024   PLT 284 10/25/2024      Chemistry      Component Value Date/Time   NA 138 10/25/2024 0824   NA 139 01/26/2024 1620   K 3.8 10/25/2024 0824   CL 105 10/25/2024 0824   CO2 26 10/25/2024 0824   BUN 13 10/25/2024 0824   BUN 28 (H) 01/26/2024 1620   CREATININE 1.41 (H) 10/25/2024 0824      Component Value Date/Time   CALCIUM  10.3 10/25/2024 0824   ALKPHOS 87 10/25/2024 0824   AST 21 10/25/2024 0824   ALT 15 10/25/2024 0824   BILITOT 0.6 10/25/2024 0824   BILITOT <0.2 01/01/2024 1012      Latest Reference Range & Units 09/27/24 07:58  TSH 0.350 - 4.500 uIU/mL 1.320    RADIOGRAPHIC STUDIES: I have personally reviewed the radiological images as listed and agreed with the findings in the report.  NM PET Image Restag (PS) Skull Base To Thigh EXAM: PET AND CT SKULL BASE TO MID THIGH 09/22/2024 12:13:30 PM  TECHNIQUE:  RADIOPHARMACEUTICAL: 10.7 mCi F-18 FDG Uptake time 60 minutes. Glucose level 116 mg/dl.  PET imaging was acquired from the base of the skull to the mid thighs. Non-contrast enhanced computed tomography was obtained for attenuation correction and anatomic localization.  COMPARISON: PET scan [id number].  CLINICAL HISTORY: Gastric cancer, assess treatment response. 10.70 mCi FDG/ RAC/ FMU HJP 1043; BG: 116 mg/dL; Gastric cancer, assess treatment response, Gastric adenocarcinoma ; NPO; DM; no insulin ; No SxBx; No Tx; EOV 09/12/24  FINDINGS:  HEAD AND NECK: No metabolically  active cervical lymphadenopathy.  CHEST: No suspicious pulmonary nodules. No hypermetabolic mediastinal lymph nodes. No abnormal metabolic activity associated with the esophagus.  ABDOMEN AND PELVIS: No abnormal metabolic activity associated with the stomach. No hypermetabolic upper abdominal lymph nodes. No liver metastases. Post left nephrectomy. Physiologic activity within  the gastrointestinal and genitourinary systems.  BONES AND SOFT TISSUE: Uniform uptake within the marrow space of the spine and pelvis is favored to represent GCSF response. No metabolically active aggressive osseous lesion. Hip prosthetics and posterior lumbar fusion. Benign lipoma in the left thigh.  IMPRESSION: 1. No evidence of metabolically active disease related to gastric cancer. 2. Uniform diffuse marrow uptake likely related to G-CSF effect.  Electronically signed by: Norleen Boxer MD 09/24/2024 02:46 PM EDT RP Workstation: [id number]

## 2024-10-25 NOTE — Progress Notes (Signed)
 No treatment today per provider.   Patient tolerated hydration with no complaints voiced.  Port site clean and dry with good blood return noted before and after hydration.  No bruising or swelling noted with port.  Band aid applied.  VSS with discharge and left ambulatory with no s/s of distress noted.

## 2024-10-25 NOTE — Patient Instructions (Signed)
 [facility] at [facility] Discharge Instructions   You were seen and examined today by Dr. Davonna.  She reviewed the results of your lab work which are normal/stable.   We will hold your treatment today. We will resume treatment next week.   Return as scheduled.    Thank you for choosing [facility] at [facility] to provide your oncology and hematology care.  To afford each patient quality time with our provider, please arrive at least 15 minutes before your scheduled appointment time.   If you have a lab appointment with the Cancer Center please come in thru the Main Entrance and check in at the main information desk.  You need to re-schedule your appointment should you arrive 10 or more minutes late.  We strive to give you quality time with our providers, and arriving late affects you and other patients whose appointments are after yours.  Also, if you no show three or more times for appointments you may be dismissed from the clinic at the providers discretion.     Again, thank you for choosing [facility].  Our hope is that these requests will decrease the amount of time that you wait before being seen by our physicians.       _____________________________________________________________  Should you have questions after your visit to [facility], please contact our office at [phone] and follow the prompts.  Our office hours are 8:00 a.m. and 4:30 p.m. Monday - Friday.  Please note that voicemails left after 4:00 p.m. may not be returned until the following business day.  We are closed weekends and major holidays.  You do have access to a nurse 24-7, just call the main number to the clinic [phone] and do not press any options, hold on the line and a nurse will answer the phone.    For prescription refill requests, have your pharmacy contact our office and allow 72 hours.    Due to Covid, you will need to  wear a mask upon entering the hospital. If you do not have a mask, a mask will be given to you at the Main Entrance upon arrival. For doctor visits, patients may have 1 support person age 59 or older with them. For treatment visits, patients can not have anyone with them due to social distancing guidelines and our immunocompromised population.

## 2024-10-25 NOTE — Patient Instructions (Signed)

## 2024-10-26 ENCOUNTER — Inpatient Hospital Stay

## 2024-10-26 LAB — T4: T4, Total: 12.7 ug/dL — ABNORMAL HIGH (ref 4.5–12.0)

## 2024-10-31 ENCOUNTER — Telehealth: Payer: Self-pay | Admitting: Dietician

## 2024-10-31 ENCOUNTER — Inpatient Hospital Stay: Attending: Oncology | Admitting: Dietician

## 2024-10-31 DIAGNOSIS — Z5111 Encounter for antineoplastic chemotherapy: Secondary | ICD-10-CM | POA: Insufficient documentation

## 2024-10-31 DIAGNOSIS — C169 Malignant neoplasm of stomach, unspecified: Secondary | ICD-10-CM | POA: Insufficient documentation

## 2024-10-31 DIAGNOSIS — Z79633 Long term (current) use of mitotic inhibitor: Secondary | ICD-10-CM | POA: Insufficient documentation

## 2024-10-31 DIAGNOSIS — Z79620 Long term (current) use of immunosuppressive biologic: Secondary | ICD-10-CM | POA: Insufficient documentation

## 2024-10-31 DIAGNOSIS — Z79630 Long term (current) use of alkylating agent: Secondary | ICD-10-CM | POA: Insufficient documentation

## 2024-10-31 DIAGNOSIS — Z5189 Encounter for other specified aftercare: Secondary | ICD-10-CM | POA: Insufficient documentation

## 2024-10-31 DIAGNOSIS — Z79631 Long term (current) use of antimetabolite agent: Secondary | ICD-10-CM | POA: Insufficient documentation

## 2024-10-31 NOTE — Telephone Encounter (Signed)
 Nutrition Follow-up:  Pt with gastric cancer. He receiving neoadjuvant D-FLOT Patient is under the care of Dr. Davonna   11/13 - EUS [facility]) without evidence of metabolically active disease r/t gastric cancer   Eating better. Had a bowl of grits for breakfast. Recalls turkey, yams, potato salad, mac/cheese for Thanksgiving. Wife reports appetite will be poor again after treatment tomorrow. Patient is using baking soda salt water  gargles which helps some. Patient likes the Ensure Clear when regular Ensure is too thick, however unable to find these in stores.   Wife reports patient has had 2 falls recently. Bruising to knee, elbow, and hip per wife. Patient did not seek medical evaluation. Wife to discuss this with Dr. Davonna at tomorrow's visit.   Medications: reviewed   Labs: 11/25 reviewed   Anthropometrics: Wt 205 lb 11.2 oz on 11/25  11/11 - 210 lb 12.2 oz 10/13 - 215 lb    NUTRITION DIAGNOSIS: Food and nutrition related knowledge deficit - improving    INTERVENTION:  Continue baking soda salt water  gargles several times daily before meals and recommend after tobacco use Educated on [employer] availability of Ensure Clear - will leave samples + coupons for 12/2 [facility] appointment     MONITORING, EVALUATION, GOAL: wt trends, intake    NEXT VISIT: Monday December 15 during infusion

## 2024-11-01 ENCOUNTER — Inpatient Hospital Stay

## 2024-11-01 VITALS — BP 147/99 | HR 87 | Temp 96.8°F | Resp 18 | Wt 200.8 lb

## 2024-11-01 DIAGNOSIS — C169 Malignant neoplasm of stomach, unspecified: Secondary | ICD-10-CM

## 2024-11-01 DIAGNOSIS — Z79631 Long term (current) use of antimetabolite agent: Secondary | ICD-10-CM | POA: Diagnosis not present

## 2024-11-01 DIAGNOSIS — Z79630 Long term (current) use of alkylating agent: Secondary | ICD-10-CM | POA: Diagnosis not present

## 2024-11-01 DIAGNOSIS — Z5111 Encounter for antineoplastic chemotherapy: Secondary | ICD-10-CM | POA: Diagnosis present

## 2024-11-01 DIAGNOSIS — Z5189 Encounter for other specified aftercare: Secondary | ICD-10-CM | POA: Diagnosis not present

## 2024-11-01 DIAGNOSIS — Z79620 Long term (current) use of immunosuppressive biologic: Secondary | ICD-10-CM | POA: Diagnosis not present

## 2024-11-01 DIAGNOSIS — Z79633 Long term (current) use of mitotic inhibitor: Secondary | ICD-10-CM | POA: Diagnosis not present

## 2024-11-01 LAB — COMPREHENSIVE METABOLIC PANEL WITH GFR
ALT: 13 U/L (ref 0–44)
AST: 22 U/L (ref 15–41)
Albumin: 3.6 g/dL (ref 3.5–5.0)
Alkaline Phosphatase: 87 U/L (ref 38–126)
Anion gap: 9 (ref 5–15)
BUN: 17 mg/dL (ref 8–23)
CO2: 28 mmol/L (ref 22–32)
Calcium: 10.5 mg/dL — ABNORMAL HIGH (ref 8.9–10.3)
Chloride: 105 mmol/L (ref 98–111)
Creatinine, Ser: 1.24 mg/dL (ref 0.61–1.24)
GFR, Estimated: >60 mL/min (ref >60)
Glucose, Bld: 106 mg/dL — ABNORMAL HIGH (ref 70–99)
Potassium: 4.1 mmol/L (ref 3.5–5.1)
Sodium: 141 mmol/L (ref 135–145)
Total Bilirubin: 0.7 mg/dL (ref 0.0–1.2)
Total Protein: 6.3 g/dL — ABNORMAL LOW (ref 6.5–8.1)

## 2024-11-01 LAB — MAGNESIUM: Magnesium: 1.6 mg/dL — ABNORMAL LOW (ref 1.7–2.4)

## 2024-11-01 LAB — CBC WITH DIFFERENTIAL/PLATELET
Abs Immature Granulocytes: 0.17 K/uL — ABNORMAL HIGH (ref 0.00–0.07)
Basophils Absolute: 0 K/uL (ref 0.0–0.1)
Basophils Relative: 0 %
Eosinophils Absolute: 0 K/uL (ref 0.0–0.5)
Eosinophils Relative: 0 %
HCT: 37.9 % — ABNORMAL LOW (ref 39.0–52.0)
Hemoglobin: 11.7 g/dL — ABNORMAL LOW (ref 13.0–17.0)
Immature Granulocytes: 1 %
Lymphocytes Relative: 9 %
Lymphs Abs: 1.2 K/uL (ref 0.7–4.0)
MCH: 26.6 pg (ref 26.0–34.0)
MCHC: 30.9 g/dL (ref 30.0–36.0)
MCV: 86.1 fL (ref 80.0–100.0)
Monocytes Absolute: 0.8 K/uL (ref 0.1–1.0)
Monocytes Relative: 5 %
Neutro Abs: 12.2 K/uL — ABNORMAL HIGH (ref 1.7–7.7)
Neutrophils Relative %: 85 %
Platelets: 366 K/uL (ref 150–400)
RBC: 4.4 MIL/uL (ref 4.22–5.81)
RDW: 21.3 % — ABNORMAL HIGH (ref 11.5–15.5)
Smear Review: NORMAL
WBC: 14.4 K/uL — ABNORMAL HIGH (ref 4.0–10.5)
nRBC: 0 % (ref 0.0–0.2)

## 2024-11-01 LAB — TSH: TSH: 0.824 u[IU]/mL (ref 0.350–4.500)

## 2024-11-01 MED ORDER — SODIUM CHLORIDE 0.9 % IV SOLN
1500.0000 mg | Freq: Once | INTRAVENOUS | Status: AC
  Administered 2024-11-01: 1500 mg via INTRAVENOUS
  Filled 2024-11-01: qty 30

## 2024-11-01 MED ORDER — PALONOSETRON HCL INJECTION 0.25 MG/5ML
0.2500 mg | Freq: Once | INTRAVENOUS | Status: AC
  Administered 2024-11-01: 0.25 mg via INTRAVENOUS
  Filled 2024-11-01: qty 5

## 2024-11-01 MED ORDER — DEXAMETHASONE SOD PHOSPHATE PF 10 MG/ML IJ SOLN
10.0000 mg | Freq: Once | INTRAMUSCULAR | Status: AC
  Administered 2024-11-01: 10 mg via INTRAVENOUS

## 2024-11-01 MED ORDER — SODIUM CHLORIDE 0.9 % IV SOLN: Freq: Once | INTRAVENOUS | Status: AC

## 2024-11-01 MED ORDER — SODIUM CHLORIDE 0.9 % IV SOLN
50.0000 mg/m2 | Freq: Once | INTRAVENOUS | Status: AC
  Administered 2024-11-01: 122 mg via INTRAVENOUS
  Filled 2024-11-01: qty 12.2

## 2024-11-01 MED ORDER — MAGNESIUM SULFATE 2 GM/50ML IV SOLN
2.0000 g | Freq: Once | INTRAVENOUS | Status: AC
  Administered 2024-11-01: 2 g via INTRAVENOUS
  Filled 2024-11-01: qty 50

## 2024-11-01 MED ORDER — OXALIPLATIN CHEMO INJECTION 100 MG/20ML
85.0000 mg/m2 | Freq: Once | INTRAVENOUS | Status: AC
  Administered 2024-11-01: 200 mg via INTRAVENOUS
  Filled 2024-11-01: qty 40

## 2024-11-01 MED ORDER — SODIUM CHLORIDE 0.9 % IV SOLN
6200.0000 mg | INTRAVENOUS | Status: DC
  Administered 2024-11-01: 6200 mg via INTRAVENOUS
  Filled 2024-11-01: qty 124

## 2024-11-01 MED ORDER — DEXTROSE 5 % IV SOLN: INTRAVENOUS | Status: DC

## 2024-11-01 MED ORDER — SODIUM CHLORIDE 0.9 % IV SOLN: INTRAVENOUS | Status: DC

## 2024-11-01 MED ORDER — LEUCOVORIN CALCIUM INJECTION 350 MG
200.0000 mg/m2 | Freq: Once | INTRAVENOUS | Status: AC
  Administered 2024-11-01: 488 mg via INTRAVENOUS
  Filled 2024-11-01: qty 24.4

## 2024-11-01 NOTE — Progress Notes (Signed)
 Patient presents today for FLOT infusion with 5FU pump start per providers order.  Vital signs and labs within parameters for treatment.  Patient has no new complaints at this time.  Treatment given today per MD orders.  Stable during infusion without adverse affects.  Vital signs stable.  No complaints at this time.  5FU pump connected and verified RUN on the screen with the patient.  Discharge from clinic ambulatory in stable condition.  Alert and oriented X 3.  Follow up with [facility] as scheduled.

## 2024-11-01 NOTE — Patient Instructions (Signed)
 [facility] [place] - A DEPT OF [facility]  Discharge Instructions: Thank you for choosing [facility] to provide your oncology and hematology care.  If you have a lab appointment with the Cancer Center - please note that after April 8th, 2024, all labs will be drawn in the cancer center.  You do not have to check in or register with the main entrance as you have in the past but will complete your check-in in the cancer center.  Wear comfortable clothing and clothing appropriate for easy access to any Portacath or PICC line.   We strive to give you quality time with your provider. You may need to reschedule your appointment if you arrive late (15 or more minutes).  Arriving late affects you and other patients whose appointments are after yours.  Also, if you miss three or more appointments without notifying the office, you may be dismissed from the clinic at the provider's discretion.      For prescription refill requests, have your pharmacy contact our office and allow 72 hours for refills to be completed.    Today you received the following chemotherapy and/or immunotherapy agents FLOT   To help prevent nausea and vomiting after your treatment, we encourage you to take your nausea medication as directed.  BELOW ARE SYMPTOMS THAT SHOULD BE REPORTED IMMEDIATELY: *FEVER GREATER THAN 100.4 F (38 C) OR HIGHER *CHILLS OR SWEATING *NAUSEA AND VOMITING THAT IS NOT CONTROLLED WITH YOUR NAUSEA MEDICATION *UNUSUAL SHORTNESS OF BREATH *UNUSUAL BRUISING OR BLEEDING *URINARY PROBLEMS (pain or burning when urinating, or frequent urination) *BOWEL PROBLEMS (unusual diarrhea, constipation, pain near the anus) TENDERNESS IN MOUTH AND THROAT WITH OR WITHOUT PRESENCE OF ULCERS (sore throat, sores in mouth, or a toothache) UNUSUAL RASH, SWELLING OR PAIN  UNUSUAL VAGINAL DISCHARGE OR ITCHING   Items with * indicate a potential emergency and should be followed up as soon  as possible or go to the Emergency Department if any problems should occur.  Please show the CHEMOTHERAPY ALERT CARD or IMMUNOTHERAPY ALERT CARD at check-in to the Emergency Department and triage nurse.  Should you have questions after your visit or need to cancel or reschedule your appointment, please contact [facility] [place] - A DEPT OF [facility] [phone]  and follow the prompts.  Office hours are 8:00 a.m. to 4:30 p.m. Monday - Friday. Please note that voicemails left after 4:00 p.m. may not be returned until the following business day.  We are closed weekends and major holidays. You have access to a nurse at all times for urgent questions. Please call the main number to the clinic [phone] and follow the prompts.  For any non-urgent questions, you may also contact your provider using MyChart. We now offer e-Visits for anyone 44 and older to request care online for non-urgent symptoms. For details visit [email or web address].   Also download the MyChart app! Go to the app store, search "MyChart", open the app, select [place], and log in with your MyChart username and password.

## 2024-11-02 ENCOUNTER — Inpatient Hospital Stay

## 2024-11-02 VITALS — BP 128/85 | HR 99 | Temp 98.6°F | Resp 20

## 2024-11-02 DIAGNOSIS — C169 Malignant neoplasm of stomach, unspecified: Secondary | ICD-10-CM

## 2024-11-02 DIAGNOSIS — Z5111 Encounter for antineoplastic chemotherapy: Secondary | ICD-10-CM | POA: Diagnosis not present

## 2024-11-02 LAB — T4: T4, Total: 11.7 ug/dL (ref 4.5–12.0)

## 2024-11-02 MED ORDER — PEGFILGRASTIM-CBQV 6 MG/0.6ML ~~LOC~~ SOSY
6.0000 mg | PREFILLED_SYRINGE | Freq: Once | SUBCUTANEOUS | Status: AC
  Administered 2024-11-02: 6 mg via SUBCUTANEOUS
  Filled 2024-11-02: qty 0.6

## 2024-11-02 NOTE — Progress Notes (Signed)
 Patient presents today for 5FU chemotherapy pump disconnection and Udenyca  6 mg injection per provider's order. Vital signs stable and patient voiced no new complaints at this time.   Port flushed easily with 20 mL of normal saline Good blood return noted and needle removed intact. No bruising or swelling noted at the site.  Discharged from clinic via wheelchair in stable condition. Alert and oriented x 3. F/U with [facility] as scheduled.

## 2024-11-03 ENCOUNTER — Inpatient Hospital Stay

## 2024-11-04 ENCOUNTER — Ambulatory Visit: Admitting: Cardiology

## 2024-11-04 ENCOUNTER — Other Ambulatory Visit: Payer: Self-pay

## 2024-11-05 ENCOUNTER — Other Ambulatory Visit: Payer: Self-pay | Admitting: Internal Medicine

## 2024-11-05 DIAGNOSIS — G4761 Periodic limb movement disorder: Secondary | ICD-10-CM

## 2024-11-08 ENCOUNTER — Encounter: Payer: Self-pay | Admitting: Oncology

## 2024-11-08 ENCOUNTER — Other Ambulatory Visit: Payer: Self-pay | Admitting: Oncology

## 2024-11-08 DIAGNOSIS — C169 Malignant neoplasm of stomach, unspecified: Secondary | ICD-10-CM

## 2024-11-09 ENCOUNTER — Other Ambulatory Visit: Payer: Self-pay | Admitting: *Deleted

## 2024-11-09 DIAGNOSIS — C169 Malignant neoplasm of stomach, unspecified: Secondary | ICD-10-CM

## 2024-11-09 DIAGNOSIS — R634 Abnormal weight loss: Secondary | ICD-10-CM

## 2024-11-09 MED ORDER — MEGESTROL ACETATE 625 MG/5ML PO SUSP: 625.0000 mg | Freq: Every day | ORAL | 1 refills | Status: AC

## 2024-11-09 NOTE — Telephone Encounter (Signed)
 Can we send in megace for him?

## 2024-11-10 ENCOUNTER — Emergency Department (HOSPITAL_COMMUNITY)

## 2024-11-10 ENCOUNTER — Emergency Department (HOSPITAL_COMMUNITY): Admission: EM | Admit: 2024-11-10 | Discharge: 2024-11-10 | Disposition: A | Discharge status: Home / Self Care

## 2024-11-10 ENCOUNTER — Other Ambulatory Visit: Payer: Self-pay

## 2024-11-10 ENCOUNTER — Encounter (HOSPITAL_COMMUNITY): Payer: Self-pay | Admitting: Emergency Medicine

## 2024-11-10 DIAGNOSIS — Z859 Personal history of malignant neoplasm, unspecified: Secondary | ICD-10-CM | POA: Diagnosis not present

## 2024-11-10 DIAGNOSIS — S80212A Abrasion, left knee, initial encounter: Secondary | ICD-10-CM | POA: Diagnosis not present

## 2024-11-10 DIAGNOSIS — S80211A Abrasion, right knee, initial encounter: Secondary | ICD-10-CM | POA: Insufficient documentation

## 2024-11-10 DIAGNOSIS — Z7982 Long term (current) use of aspirin: Secondary | ICD-10-CM | POA: Insufficient documentation

## 2024-11-10 DIAGNOSIS — M25561 Pain in right knee: Secondary | ICD-10-CM | POA: Diagnosis present

## 2024-11-10 DIAGNOSIS — W1830XA Fall on same level, unspecified, initial encounter: Secondary | ICD-10-CM | POA: Insufficient documentation

## 2024-11-10 DIAGNOSIS — E860 Dehydration: Secondary | ICD-10-CM | POA: Diagnosis not present

## 2024-11-10 DIAGNOSIS — W19XXXA Unspecified fall, initial encounter: Secondary | ICD-10-CM

## 2024-11-10 LAB — COMPREHENSIVE METABOLIC PANEL WITH GFR
ALT: 32 U/L (ref 0–44)
AST: 40 U/L (ref 15–41)
Albumin: 3.7 g/dL (ref 3.5–5.0)
Alkaline Phosphatase: 140 U/L — ABNORMAL HIGH (ref 38–126)
Anion gap: 16 — ABNORMAL HIGH (ref 5–15)
BUN: 9 mg/dL (ref 8–23)
CO2: 21 mmol/L — ABNORMAL LOW (ref 22–32)
Calcium: 10.8 mg/dL — ABNORMAL HIGH (ref 8.9–10.3)
Chloride: 103 mmol/L (ref 98–111)
Creatinine, Ser: 1.32 mg/dL — ABNORMAL HIGH (ref 0.61–1.24)
GFR, Estimated: 60 mL/min — ABNORMAL LOW (ref >60)
Glucose, Bld: 126 mg/dL — ABNORMAL HIGH (ref 70–99)
Potassium: 3.9 mmol/L (ref 3.5–5.1)
Sodium: 140 mmol/L (ref 135–145)
Total Bilirubin: 0.5 mg/dL (ref 0.0–1.2)
Total Protein: 6.5 g/dL (ref 6.5–8.1)

## 2024-11-10 LAB — CBC
HCT: 37.8 % — ABNORMAL LOW (ref 39.0–52.0)
Hemoglobin: 11.4 g/dL — ABNORMAL LOW (ref 13.0–17.0)
MCH: 26.1 pg (ref 26.0–34.0)
MCHC: 30.2 g/dL (ref 30.0–36.0)
MCV: 86.7 fL (ref 80.0–100.0)
Platelets: 183 K/uL (ref 150–400)
RBC: 4.36 MIL/uL (ref 4.22–5.81)
RDW: 22 % — ABNORMAL HIGH (ref 11.5–15.5)
WBC: 6.8 K/uL (ref 4.0–10.5)
nRBC: 0.7 % — ABNORMAL HIGH (ref 0.0–0.2)

## 2024-11-10 LAB — CBG MONITORING, ED: Glucose-Capillary: 133 mg/dL — ABNORMAL HIGH (ref 70–99)

## 2024-11-10 MED ORDER — LACTATED RINGERS IV BOLUS: 1000.0000 mL | Freq: Once | INTRAVENOUS | Status: DC

## 2024-11-10 NOTE — ED Notes (Signed)
 Patient did not want to be stuck anymore for IV. Says he can just drink water . EDP notified.

## 2024-11-10 NOTE — ED Provider Notes (Signed)
 [place] [facility]MENT AT [facility] Provider Note   CSN: [id number] Arrival date & time: 11/10/24  1954     Patient presents with: Fall and Weakness   Raymond Barrera. is a 63 y.o. male.   63 year old male presents for evaluation of fall.  States he is having bilateral knee pain after he had a mechanical fall for losing his balance.  Has been feeling weak, but states he always feels like this is he is on chemo.  He does have some slight hypotension here.  He states he is otherwise feeling well.  Denies any other symptoms or concerns.  States he did not lose consciousness or hit his head.   Fall Pertinent negatives include no chest pain, no abdominal pain and no shortness of breath.  Weakness Associated symptoms: no abdominal pain, no arthralgias, no chest pain, no cough, no dysuria, no fever, no seizures, no shortness of breath and no vomiting        Prior to Admission medications  Medication Sig Start Date End Date Taking? Authorizing Provider  Accu-Chek Softclix Lancets lancets SMARTSIG:Topical 2-3 Times Daily 05/15/23   [provider]  acetaminophen  (TYLENOL ) 325 MG tablet Take 2 tablets (650 mg total) by mouth every 6 (six) hours as needed for mild pain (pain score 1-3) (or Fever >/= 101). 03/03/24   Pearlean Manus, MD  albuterol  (VENTOLIN  HFA) 108 (90 Base) MCG/ACT inhaler Inhale 2 puffs into the lungs every 6 (six) hours as needed for wheezing or shortness of breath (cough). 03/03/24   Pearlean Manus, MD  amLODipine  (NORVASC ) 2.5 MG tablet Take 1 tablet (2.5 mg total) by mouth daily. 09/19/24   Croitoru, Mihai, MD  aspirin  EC 81 MG tablet Take 1 tablet (81 mg total) by mouth daily with breakfast. Swallow whole. 03/03/24   Pearlean Manus, MD  atorvastatin  (LIPITOR) 40 MG tablet Take 1 tablet (40 mg total) by mouth daily. 03/03/24   Pearlean Manus, MD  Camphor-Menthol -Methyl Sal (SALONPAS EX) Apply topically. As needed    [provider]   cyanocobalamin  (VITAMIN B12) 1000 MCG tablet Take 1,000 mcg by mouth daily. 05/03/24   [provider]  dexamethasone  (DECADRON ) 4 MG tablet Take 2 tablets (8 mg total) by mouth daily. Start the day after chemotherapy for 2 days. Take with food. 07/27/24   Davonna Siad, MD  dextrose  5 % SOLN 1,000 mL with fluorouracil  5 GM/100ML SOLN Inject into the vein every 14 (fourteen) days. 07/27/24   [provider]  diphenoxylate -atropine  (LOMOTIL ) 2.5-0.025 MG tablet Take 2 tablets by mouth 4 (four) times daily as needed for diarrhea or loose stools. 08/17/24   Kandala, Hyndavi, MD  DOCETAXEL  IV Inject into the vein every 14 (fourteen) days. 07/27/24   [provider]  DULoxetine  (CYMBALTA ) 30 MG capsule TAKE 1 CAPSULE(30 MG) BY MOUTH TWICE DAILY 08/18/24   [name], MD  Durvalumab  (IMFINZI  IV) Inject into the vein every 28 (twenty-eight) days. 07/27/24   [provider]  ferrous sulfate  325 (65 FE) MG EC tablet Take 1 tablet (325 mg total) by mouth every Tuesday, Thursday, Saturday, and Sunday. 03/03/24   Pearlean Manus, MD  gabapentin  (NEURONTIN ) 300 MG capsule Take 1 capsule (300 mg total) by mouth at bedtime. 08/03/24   [name], MD  hydrOXYzine  (ATARAX ) 25 MG tablet TAKE 1 TABLET(25 MG) BY MOUTH THREE TIMES DAILY AS NEEDED 08/16/24   Davonna Siad, MD  JARDIANCE  10 MG TABS tablet Take 1 tablet (10 mg  total) by mouth daily. 08/03/24   [name], MD  LEUCOVORIN  CALCIUM  IV Inject into the vein every 14 (fourteen) days. 07/27/24   [provider]  lidocaine -prilocaine  (EMLA ) cream Apply 1 Application topically as needed. 09/27/24   Kandala, Hyndavi, MD  megestrol (MEGACE ES) 625 MG/5ML suspension Take 5 mLs (625 mg total) by mouth daily. 11/09/24   Geofm Delon BRAVO, NP  ondansetron  (ZOFRAN ) 8 MG tablet Take 1 tablet (8 mg total) by mouth every 8 (eight) hours as needed for nausea or vomiting. Start on the third day after chemotherapy. 07/27/24    Davonna Siad, MD  OXALIPLATIN  IV Inject into the vein every 14 (fourteen) days. 07/27/24   [provider]  pantoprazole  (PROTONIX ) 40 MG tablet TAKE 1 TABLET(40 MG) BY MOUTH TWICE DAILY 07/18/24   Ahmed, Deatrice FALCON, MD  polyethylene glycol powder (GLYCOLAX /MIRALAX ) 17 GM/SCOOP powder Take 8.5 g by mouth daily. 03/15/24   Ahmed, Deatrice FALCON, MD  prochlorperazine  (COMPAZINE ) 10 MG tablet Take 1 tablet (10 mg total) by mouth every 6 (six) hours as needed for nausea or vomiting. 07/27/24   Davonna Siad, MD  rOPINIRole  (REQUIP ) 2 MG tablet Take 1 tablet (2 mg total) by mouth at bedtime. 09/09/24   Hope Almarie ORN, NP  triamcinolone  cream (KENALOG ) 0.1 % Apply 1 Application topically 2 (two) times daily. 08/10/24   Kandala, Hyndavi, MD  umeclidinium-vilanterol (ANORO ELLIPTA ) 62.5-25 MCG/ACT AEPB Inhale 1 puff into the lungs daily. 09/09/24   Hope Almarie ORN, NP    Allergies: Poison ivy extract [poison ivy extract] and Coreg  [carvedilol ]    Review of Systems  Constitutional:  Negative for chills and fever.  HENT:  Negative for ear pain and sore throat.   Eyes:  Negative for pain and visual disturbance.  Respiratory:  Negative for cough and shortness of breath.   Cardiovascular:  Negative for chest pain and palpitations.  Gastrointestinal:  Negative for abdominal pain and vomiting.  Genitourinary:  Negative for dysuria and hematuria.  Musculoskeletal:  Negative for arthralgias and back pain.  Skin:  Negative for color change and rash.  Neurological:  Positive for weakness. Negative for seizures and syncope.  All other systems reviewed and are negative.   Updated Vital Signs BP (!) 126/94   Pulse 100   Temp 97.7 F (36.5 C)   Resp 15   Ht 6' 6 (1.981 m)   Wt 91.1 kg   SpO2 93%   BMI 23.20 kg/m   Physical Exam Vitals and nursing note reviewed.  Constitutional:      General: He is not in acute distress.    Appearance: Normal appearance. He is well-developed. He is  not ill-appearing.  HENT:     Head: Normocephalic and atraumatic.  Eyes:     Conjunctiva/sclera: Conjunctivae normal.  Cardiovascular:     Rate and Rhythm: Normal rate and regular rhythm.     Heart sounds: No murmur heard. Pulmonary:     Effort: Pulmonary effort is normal. No respiratory distress.     Breath sounds: Normal breath sounds.  Abdominal:     Palpations: Abdomen is soft.     Tenderness: There is no abdominal tenderness.  Musculoskeletal:        General: No swelling, tenderness, deformity or signs of injury.     Cervical back: Neck supple.  Skin:    General: Skin is warm and dry.     Capillary Refill: Capillary refill takes less than 2 seconds.     Comments:  Bilateral knee abrasions  Neurological:     Mental Status: He is alert.  Psychiatric:        Mood and Affect: Mood normal.     (all labs ordered are listed, but only abnormal results are displayed) Labs Reviewed  COMPREHENSIVE METABOLIC PANEL WITH GFR - Abnormal; Notable for the following components:      Result Value   CO2 21 (*)    Glucose, Bld 126 (*)    Creatinine, Ser 1.32 (*)    Calcium  10.8 (*)    Alkaline Phosphatase 140 (*)    GFR, Estimated 60 (*)    Anion gap 16 (*)    All other components within normal limits  CBC - Abnormal; Notable for the following components:   Hemoglobin 11.4 (*)    HCT 37.8 (*)    RDW 22.0 (*)    nRBC 0.7 (*)    All other components within normal limits  CBG MONITORING, ED - Abnormal; Notable for the following components:   Glucose-Capillary 133 (*)    All other components within normal limits    EKG: EKG Interpretation Date/Time:  Thursday November 10 2024 20:21:10 EST Ventricular Rate:  102 PR Interval:  158 QRS Duration:  110 QT Interval:  360 QTC Calculation: 469 R Axis:   56  Text Interpretation: Sinus tachycardia Comapred with prior EKG from 04/22/2024 Confirmed by Gennaro Bouchard ([id number]) on 11/10/2024 9:31:00 PM  Radiology: [facility] Knee Complete 4 Views  Right Result Date: 11/10/2024 EXAM: 4 VIEW(S) XRAY OF THE RIGHT KNEE 11/10/2024 10:02:00 PM COMPARISON: None available. CLINICAL HISTORY: Fall, knee pain FINDINGS: BONES AND JOINTS: No acute fracture. No malalignment. No significant joint effusion. SOFT TISSUES: The soft tissues are unremarkable. IMPRESSION: 1. No significant abnormality. Electronically signed by: Franky Crease MD 11/10/2024 10:14 PM EST RP Workstation: [id number]   DG Knee Complete 4 Views Left Result Date: 11/10/2024 EXAM: 4 OR MORE VIEW(S) Xray of the Left Knee 11/10/2024 10:02:00 PM COMPARISON: None available. CLINICAL HISTORY: Fall, knee pain FINDINGS: BONES AND JOINTS: No acute fracture. No malalignment. No significant joint effusion. SOFT TISSUES: The soft tissues are unremarkable. IMPRESSION: 1. No significant abnormality. Electronically signed by: Franky Crease MD 11/10/2024 10:13 PM EST RP Workstation: [id number]   DG Chest 1 View Result Date: 11/10/2024 EXAM: 1 VIEW(S) XRAY OF THE CHEST 11/10/2024 10:02:00 PM COMPARISON: 02/29/2024 CLINICAL HISTORY: sob sob FINDINGS: LINES, TUBES AND DEVICES: Right port-a-cath in place with the tip in the SVC. LUNGS AND PLEURA: Mild stable chronic elevation of the left hemidiaphragm. No focal pulmonary opacity. No pleural effusion. No pneumothorax. HEART AND MEDIASTINUM: No acute abnormality of the cardiac and mediastinal silhouettes. BONES AND SOFT TISSUES: No acute osseous abnormality. IMPRESSION: 1. No acute cardiopulmonary process. 2. Mild chronic elevation of the left hemidiaphragm, unchanged from prior. Electronically signed by: Franky Crease MD 11/10/2024 10:12 PM EST RP Workstation: [id number]     Procedures   Medications Ordered in the ED  lactated ringers  bolus 1,000 mL (1,000 mLs Intravenous Patient Refused/Not Given 11/10/24 2228)                                    Medical Decision Making Cardiac monitor interpretation: Sinus tachycardia, no ectopy  Patient here for  weakness and a fall.  Bilateral knee x-rays are negative, lab work does show some mild dehydration.  Patient did not want to be stuck for IV fluid.  He states  he will eat and drink better at home.  Blood pressure is 90s to low 120s and I think this is a reasonable plan.  Advise close follow-up with primary care and otherwise return to the ER for new or worsening symptoms.  Patient feels current with the plan to be discharged home.  All results and plan discussed with patient and family at bedside.  Problems Addressed: Acute pain of both knees: acute illness or injury Dehydration: acute illness or injury Fall, initial encounter: acute illness or injury  Amount and/or Complexity of Data Reviewed External Data Reviewed: notes.    Details: Outpatient records reviewed and patient follows up with oncology for chemo due to history of cancer Labs: ordered. Decision-making details documented in ED Course.    Details: Reviewed by me and patient mildly dehydrated Radiology: ordered and independent interpretation performed. Decision-making details documented in ED Course.    Details: Ordered and interpreted by me independently of radiology Bilateral knee x-ray: Shows no acute abnormality Chest x-ray: Shows no acute abnormality ECG/medicine tests: ordered and independent interpretation performed. Decision-making details documented in ED Course.    Details: Ordered and interpreted by me in the absence of cardiology and shows sinus tachycardia, no STEMI, or significant change when compared to prior EKG  Risk OTC drugs. Prescription drug management. Drug therapy requiring intensive monitoring for toxicity.     Final diagnoses:  Fall, initial encounter  Acute pain of both knees  Dehydration    ED Discharge Orders     None          Gennaro Duwaine CROME, DO 11/10/24 2240

## 2024-11-10 NOTE — Discharge Instructions (Addendum)
 Drink lots of fluids and eat a balanced diet.  You can use Tylenol  as needed for pain.  Follow-up with your primary care doctor in 1 to 2 weeks.  Return to the ER for new or worsening symptoms.

## 2024-11-10 NOTE — ED Triage Notes (Signed)
 Pt here for fall after becoming dizzy from weakness. Wife states that pt is currently undergoing chemotherapy and immunotherapy and that as a result, his appetite has been decreased thus making him weak. Wife states they have been trying to build up his weakness by increasing his activity but that he is unsteady and he got tripped up. Pt with abrasions to both knees. BP 83/71 in triage. Wife states pt is on BP med and that he took his daily dose today. Wife reports she does not check pt's BP prior to giving pt BP med. Wife and pt educated on this.

## 2024-11-13 NOTE — Progress Notes (Deleted)
 Patient Care Team: [name], MD as PCP - General (Internal Medicine) Francyne Headland, MD as PCP - Cardiology (Cardiology) Beuford Anes, MD as Consulting Physician (Orthopedic Surgery) Devere Lonni Righter, MD as Consulting Physician (Urology) Skeet Juliene SAUNDERS, DO as Consulting Physician (Neurology)  Clinic Day:  11/13/2024  Referring physician: [name], MD   CHIEF COMPLAINT:  CC: Gastric adenocarcinoma    ASSESSMENT & PLAN:   Assessment & Plan: Raymond Barrera.  is a 63 y.o. 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   Oncology history as below Likely stage I-T2N0M0 (T2-T3 per EUS report) PET scan with no evidence of metastatic disease Evaluated by surgical oncology at [facility] and recommended to follow-up after neoadjuvant chemoimmunotherapy 07/27/2024: Started on D-FLOT regimen per [facility] trial    - C7D1 today.  Patient reported feeling not very good today.  He has lost significant weight and has no appetite.  Would defer chemotherapy at this time.  Will get everything scheduled for next week. -We reviewed the EUS findings together.  There is slight decrease in the size of the ulcer. - He is scheduled to see Dr. Debora at [facility] on 11/04/2024.   - Labs reviewed today: Creatinine: 1.41-stable, LFTs WNL.  CBC: WBC: 3.4, hemoglobin: 11.6, platelets: 284.  TSH: 2.340 -Will hold chemotherapy today at patient's preference.  Will reschedule for infusion in 1 week. -Patient also has a radiation oncology consult at [facility] on 11/02/2024.   Return to clinic in 3 weeks prior to next cycle of chemotherapy  Hypotension Blood pressure of 94/71 mmHg today  - Will administer 1 L normal saline for symptomatic management.  Cancer-related weight loss and poor appetite Significant weight loss and poor appetite during  chemotherapy. Discussed potential feeding tube if weight loss persists, though not preferred by him.  - Monitor weight closely.  - Consider feeding tube if weight loss continues.  Chemotherapy-induced skin changes Peeling skin on hands and hyperpigmentation on feet, likely chemotherapy-related. Resolved at this time.  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  Numbness and tingling in hands remain unchanged.  Chemotherapy-induced nausea and diarrhea Mild nausea and occasional diarrhea post-treatment, no vomiting.  -As needed Zofran  and Imodium are available for the patient.   Tobacco use Patient is a chronic smoker and is currently smoking 1 to 2 cigarettes/day   - Encouraged his attempts to abstain from smoking  Chronic kidney disease stage III Chronic kidney disease likely secondary to only 1 functioning kidney  Follows with Dr. 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   - Currently in remission   The patient understands the plans discussed today and is in agreement with them.  He knows to contact our office if he develops concerns prior to his next appointment.  The total time spent in the appointment was 20 minutes for the encounter with patient, including review of chart and various tests results, discussions about plan of care and coordination of care plan    [name], MD  [facility] [place] - A DEPT OF [name] [address] [place] [address] Dept: [phone] Dept Fax: [phone]   No orders  of the defined types were placed in this encounter.    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 Reactive gastropathy with intestinal metaplasia showing focal dysplasia. Negative for H. Pylori -05/11/2024: [facility] NGS:  CLDN18: Positive, PD-L1: Positive, CPS: 10, HER2: Negative   -MSI: Stable, MMR: Proficient, TMB: Low, 5 mut/Mb   -BRAF, RET, NTRK 1/2/3, ARID1A, BCR, CCN E1, CDH 1, EBER, EGFR,  FGFR2, KRAS, MET, PIK3CA, RHOA: Negative -05/19/2024: PET scan: Only slight area of asymmetric uptake along the midbody of the stomach with an adjacent metallic clip. No additional areas of abnormal uptake at this time.  -06/15/2024: EUS : Staging gastric adenocarcinoma at least T2 (maybe early T3) N0 Mx by EUS.  -06/23/2024: IR guided port placement -07/12/2024: Evaluated by surgical oncology at [facility]: Thought to be borderline surgical candidate and will re-evaluate after 4 cycles of chemo immunotherapy -07/21/2024: PET: Persistent mildwall thickening involving the gastric fundus and body with mild, non-specific tracer uptake (SUV max 2.5, previously 3.6). No hypermetabolic nodal metastasis or distant metastatic disease. -07/27/2024- Current: Neoadjuvant D-FLOT -09/22/2024: PET scan: No evidence of metabolically active disease related to gastric cancer.  -10/13/2024: EUS: Superficial ulcer in the body of the stomach and lesser curve of the  Stomach. There is no adenopathy and the ulcer measures 1.5cm by EUS, probable T2.   Current Treatment: Neoadjuvant chemotherapy with D-FLOT  INTERVAL HISTORY:   Discussed the use of AI scribe software for clinical note transcription with the patient, who gave verbal consent to proceed.  History of Present Illness     I have reviewed the past medical history, past surgical history, social history and family history with the patient and they are unchanged from previous note.  ALLERGIES:  is allergic to poison ivy extract [poison ivy extract] and coreg  [carvedilol ].  MEDICATIONS:  Current Outpatient Medications   Medication Sig Dispense Refill   Accu-Chek Softclix Lancets lancets SMARTSIG:Topical 2-3 Times Daily     acetaminophen  (TYLENOL ) 325 MG tablet Take 2 tablets (650 mg total) by mouth every 6 (six) hours as needed for mild pain (pain score 1-3) (or Fever >/= 101).     albuterol  (VENTOLIN  HFA) 108 (90 Base) MCG/ACT inhaler Inhale 2 puffs into the lungs every 6 (six) hours as needed for wheezing or shortness of breath (cough). 8 g 2   amLODipine  (NORVASC ) 2.5 MG tablet Take 1 tablet (2.5 mg total) by mouth daily. 90 tablet 3   aspirin  EC 81 MG tablet Take 1 tablet (81 mg total) by mouth daily with breakfast. Swallow whole. 30 tablet 12   atorvastatin  (LIPITOR) 40 MG tablet Take 1 tablet (40 mg total) by mouth daily. 90 tablet 3   Camphor-Menthol -Methyl Sal (SALONPAS EX) Apply topically. As needed     cyanocobalamin  (VITAMIN B12) 1000 MCG tablet Take 1,000 mcg by mouth daily.     dexamethasone  (DECADRON ) 4 MG tablet Take 2 tablets (8 mg total) by mouth daily. Start the day after chemotherapy for 2 days. Take with food. 30 tablet 1   dextrose  5 % SOLN 1,000 mL with fluorouracil  5 GM/100ML SOLN Inject into the vein every 14 (fourteen) days.     diphenoxylate -atropine  (LOMOTIL ) 2.5-0.025 MG tablet Take 2 tablets by mouth 4 (four) times daily as needed for diarrhea or loose stools. 60 tablet 0   DOCETAXEL  IV Inject into the vein every 14 (fourteen) days.     DULoxetine  (CYMBALTA )  30 MG capsule TAKE 1 CAPSULE(30 MG) BY MOUTH TWICE DAILY 60 capsule 3   Durvalumab  (IMFINZI  IV) Inject into the vein every 28 (twenty-eight) days.     ferrous sulfate  325 (65 FE) MG EC tablet Take 1 tablet (325 mg total) by mouth every Tuesday, Thursday, Saturday, and Sunday. 45 tablet 3   gabapentin  (NEURONTIN ) 300 MG capsule Take 1 capsule (300 mg total) by mouth at bedtime. 90 capsule 1   hydrOXYzine  (ATARAX ) 25 MG tablet TAKE 1 TABLET(25 MG) BY MOUTH THREE TIMES DAILY AS NEEDED 30 tablet 0   JARDIANCE  10 MG TABS tablet Take 1  tablet (10 mg total) by mouth daily. 30 tablet 5   LEUCOVORIN  CALCIUM  IV Inject into the vein every 14 (fourteen) days.     lidocaine -prilocaine  (EMLA ) cream Apply 1 Application topically as needed. 30 g 1   megestrol  (MEGACE  ES) 625 MG/5ML suspension Take 5 mLs (625 mg total) by mouth daily. 150 mL 1   ondansetron  (ZOFRAN ) 8 MG tablet Take 1 tablet (8 mg total) by mouth every 8 (eight) hours as needed for nausea or vomiting. Start on the third day after chemotherapy. 30 tablet 1   OXALIPLATIN  IV Inject into the vein every 14 (fourteen) days.     pantoprazole  (PROTONIX ) 40 MG tablet TAKE 1 TABLET(40 MG) BY MOUTH TWICE DAILY 60 tablet 2   polyethylene glycol powder (GLYCOLAX /MIRALAX ) 17 GM/SCOOP powder Take 8.5 g by mouth daily.     prochlorperazine  (COMPAZINE ) 10 MG tablet Take 1 tablet (10 mg total) by mouth every 6 (six) hours as needed for nausea or vomiting. 30 tablet 1   rOPINIRole  (REQUIP ) 2 MG tablet Take 1 tablet (2 mg total) by mouth at bedtime. 30 tablet 5   triamcinolone  cream (KENALOG ) 0.1 % Apply 1 Application topically 2 (two) times daily. 30 g 0   umeclidinium-vilanterol (ANORO ELLIPTA ) 62.5-25 MCG/ACT AEPB Inhale 1 puff into the lungs daily. 60 each 5   No current facility-administered medications for this visit.    REVIEW OF SYSTEMS:   Constitutional: Denies fevers, chills or abnormal weight loss Eyes: Denies blurriness of vision Ears, nose, mouth, throat, and face: Denies mucositis or sore throat Respiratory: Denies cough, dyspnea or wheezes Cardiovascular: Denies palpitation, chest discomfort or lower extremity swelling Gastrointestinal:  Denies heartburn or change in bowel habits Skin: Denies abnormal skin rashes Lymphatics: Denies new lymphadenopathy or easy bruising Neurological: Positive for numbness in hands, Denies numbness, tingling or new weaknesses All other systems were reviewed with the patient and are negative.   VITALS:    Performance status (ECOG): 1  - Symptomatic but completely ambulatory  PHYSICAL EXAM:   GENERAL:alert, no distress and comfortable LYMPH:  no palpable lymphadenopathy in the cervical, axillary or inguinal LUNGS: clear to auscultation and percussion with normal breathing effort HEART: regular rate & rhythm and no murmurs and no lower extremity edema ABDOMEN:abdomen soft, non-tender and normal bowel sounds Musculoskeletal:no cyanosis of digits and no clubbing  NEURO: alert & oriented x 3 with fluent speech  LABORATORY DATA:  I have reviewed the data as listed    Component Value Date/Time   NA 140 11/10/2024 2030   NA 139 01/26/2024 1620   K 3.9 11/10/2024 2030   CL 103 11/10/2024 2030   CO2 21 (L) 11/10/2024 2030   GLUCOSE 126 (H) 11/10/2024 2030   BUN 9 11/10/2024 2030   BUN 28 (H) 01/26/2024 1620   CREATININE 1.32 (H) 11/10/2024 2030   CALCIUM  10.8 (H) 11/10/2024 2030  PROT 6.5 11/10/2024 2030   PROT 7.2 01/01/2024 1012   ALBUMIN  3.7 11/10/2024 2030   ALBUMIN  4.4 01/01/2024 1012   AST 40 11/10/2024 2030   ALT 32 11/10/2024 2030   ALKPHOS 140 (H) 11/10/2024 2030   BILITOT 0.5 11/10/2024 2030   BILITOT <0.2 01/01/2024 1012   GFRNONAA 60 (L) 11/10/2024 2030   GFRAA 40 (L) 04/12/2020 1425    Lab Results  Component Value Date   WBC 6.8 11/10/2024   NEUTROABS 12.2 (H) 11/01/2024   HGB 11.4 (L) 11/10/2024   HCT 37.8 (L) 11/10/2024   MCV 86.7 11/10/2024   PLT 183 11/10/2024      Chemistry      Component Value Date/Time   NA 140 11/10/2024 2030   NA 139 01/26/2024 1620   K 3.9 11/10/2024 2030   CL 103 11/10/2024 2030   CO2 21 (L) 11/10/2024 2030   BUN 9 11/10/2024 2030   BUN 28 (H) 01/26/2024 1620   CREATININE 1.32 (H) 11/10/2024 2030      Component Value Date/Time   CALCIUM  10.8 (H) 11/10/2024 2030   ALKPHOS 140 (H) 11/10/2024 2030   AST 40 11/10/2024 2030   ALT 32 11/10/2024 2030   BILITOT 0.5 11/10/2024 2030   BILITOT <0.2 01/01/2024 1012      Latest Reference Range & Units  09/27/24 07:58  TSH 0.350 - 4.500 uIU/mL 1.320    RADIOGRAPHIC STUDIES: I have personally reviewed the radiological images as listed and agreed with the findings in the report.  DG Knee Complete 4 Views Right EXAM: 4 VIEW(S) XRAY OF THE RIGHT KNEE 11/10/2024 10:02:00 PM  COMPARISON: None available.  CLINICAL HISTORY: Fall, knee pain  FINDINGS:  BONES AND JOINTS: No acute fracture. No malalignment. No significant joint effusion.  SOFT TISSUES: The soft tissues are unremarkable.  IMPRESSION: 1. No significant abnormality.  Electronically signed by: Franky Crease MD 11/10/2024 10:14 PM EST RP Workstation: [id number] DG Knee Complete 4 Views Left EXAM: 4 OR MORE VIEW(S) Xray of the Left Knee 11/10/2024 10:02:00 PM  COMPARISON: None available.  CLINICAL HISTORY: Fall, knee pain  FINDINGS:  BONES AND JOINTS: No acute fracture. No malalignment. No significant joint effusion.  SOFT TISSUES: The soft tissues are unremarkable.  IMPRESSION: 1. No significant abnormality.  Electronically signed by: Franky Crease MD 11/10/2024 10:13 PM EST RP Workstation: [id number] DG Chest 1 View EXAM: 1 VIEW(S) XRAY OF THE CHEST 11/10/2024 10:02:00 PM  COMPARISON: 02/29/2024  CLINICAL HISTORY: sob sob  FINDINGS:  LINES, TUBES AND DEVICES: Right port-a-cath in place with the tip in the SVC.  LUNGS AND PLEURA: Mild stable chronic elevation of the left hemidiaphragm. No focal pulmonary opacity. No pleural effusion. No pneumothorax.  HEART AND MEDIASTINUM: No acute abnormality of the cardiac and mediastinal silhouettes.  BONES AND SOFT TISSUES: No acute osseous abnormality.  IMPRESSION: 1. No acute cardiopulmonary process. 2. Mild chronic elevation of the left hemidiaphragm, unchanged from prior.  Electronically signed by: Franky Crease MD 11/10/2024 10:12 PM EST RP Workstation: [id number]

## 2024-11-14 ENCOUNTER — Inpatient Hospital Stay: Admitting: Oncology

## 2024-11-14 ENCOUNTER — Inpatient Hospital Stay

## 2024-11-14 ENCOUNTER — Telehealth: Payer: Self-pay | Admitting: Dietician

## 2024-11-14 ENCOUNTER — Inpatient Hospital Stay: Admitting: Dietician

## 2024-11-14 NOTE — Telephone Encounter (Signed)
 Nutrition Follow-up:  Pt with gastric cancer. He receiving neoadjuvant D-FLOT Patient is under the care of Dr. Davonna    11/13 - EUS [facility]) without evidence of metabolically active disease r/t gastric cancer   Seen in ED for evaluation of fall 12/11  Spoke with wife of patient via telephone. Patient present via speaker phone. Wife reports improved appetite and intake since starting megace . Currently taking once/day. Patient eating more than he has in some time. Patient had eggs, sausage, cheese scramble for breakfast. Recalls 2 bowls of vegetable beef soup for dinner. Reports another bowl sometime overnight. Denies nausea, vomiting, diarrhea, constipation at this time.   Medications: reviewed   Labs: glucose 126, Cr 1.32  Anthropometrics: Wt 200 lb 12.8 oz on 12/11   11/25 - 205 lb 11.2 oz 10/13 - 215 lb 13.3 oz   NUTRITION DIAGNOSIS: Food and nutrition related knowledge deficit - improved    INTERVENTION:  Continue baking soda salt water  gargles Continue drinking Ensure HP for added calories and protein  Megace  for appetite     MONITORING, EVALUATION, GOAL: wt trends, intake   NEXT VISIT: To be determined - pending c/s with rad onc

## 2024-11-15 ENCOUNTER — Inpatient Hospital Stay

## 2024-11-16 ENCOUNTER — Inpatient Hospital Stay

## 2024-11-17 ENCOUNTER — Encounter: Payer: Self-pay | Admitting: Oncology

## 2024-11-23 ENCOUNTER — Encounter: Payer: Self-pay | Admitting: *Deleted

## 2024-11-28 ENCOUNTER — Encounter: Payer: Self-pay | Admitting: *Deleted

## 2024-12-05 NOTE — Progress Notes (Signed)
 GI Location of Tumor / Histology: Gastric Cancer  Raymond Barrera. presented with iron  deficiency anemia in 2024 and was delayed in proceeding with workup until April 2025.  Endoscopy 04/19/2024 showed a ulcer in the greater curvature of the gastric body.    Biopsies of Stomach Mass 04/19/2024    Past/Anticipated interventions by surgeon, if any:  -Not a good surgical candidate due to co-morbidities.  Past/Anticipated interventions by medical oncology, if any:  Chemo at [facility] -FLOT started 07/27/2024, s/p 4 cycles, completed 09/12/2024. Dr. Davonna 12/07/2024  Weight changes, if any: He reports weight loss.  Bowel/Bladder complaints, if any: Denies any issues.  Nausea / Vomiting, if any: He reports occasional nausea.  Pain issues, if any:  All over pain, 10/10, he is taking tylenol  arthritis.  Appetite: Doing much better since he is off of Chemo.  SAFETY ISSUES: Prior radiation? No Pacemaker/ICD? No Possible current pregnancy? N/a Is the patient on methotrexate? No  Current Complaints/Details: Right Chest Port in place.

## 2024-12-05 NOTE — Progress Notes (Addendum)
 " Radiation Oncology         [phone]) [phone] ________________________________  Name: Raymond Barrera.        MRN: [id number]  Date of Service: 12/06/2024 DOB: 11/16/1961  RR:Ejuzo, Suzzane POUR, MD  Missy Lynwood Loving, MD     REFERRING PHYSICIAN: Missy Lynwood Loving, MD   ------------------------------------------------------------------------------------------------------------------------------------------------ Addendum Please see the note from Donald Husband, PA-C from today's visit for more details of today's encounter.  I have personally performed a face to face diagnostic evaluation on this patient and devised the following assessment and plan.  The patient underwent evaluation today for his diagnosis of adenocarcinoma of the stomach.  He initially was diagnosed with what was felt to likely represent a T3 N0 M0 adenocarcinoma.  He had no evidence of regional or distant disease on initial pet imaging.  The patient has proceeded with chemotherapy and it appears that he has done well with this overall.  Repeat PET scan in October did not reveal any evidence of clear hypermetabolic activity.  A biopsy of the stomach was performed on 10/13/2024 and this revealed residual adenocarcinoma pathologically.  He has been seen by surgery and due to comorbidities the decision has been made with the family to try to avoid resection.  He has met with radiation oncology as well as medical oncology and wishes to proceed with chemoradiation treatment at this time.  He sees medical oncology at [facility] tomorrow as well.  We therefore discussed what would be entailed in a 5-1/2-week course of chemoradiation treatment.  We discussed the rationale of treatment as well as possible side effects and risks.  All of his questions were answered as were those of his family.  They are eager to proceed and he will be scheduled for simulation in the near future for treatment planning, pending discussion with medical  oncology.  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   HISTORY OF PRESENT ILLNESS: Raymond Barrera. is a 64 y.o. male seen at the request of Dr. Missy in radiation oncology at [facility], [facility] for gastric cancer.  The patient has a history of a renal cell carcinoma of the left kidney treated surgically in 2022 with no need for adjuvant therapy.  He presented with iron  deficiency anemia in 2024 and was encouraged to proceed with workup endoscopically, this was delayed until April 2025 and at that time endoscopy was negative but repeated on 04/19/2024 and a ulcer was noted in the greater curvature of the gastric body.  Biopsies of this site showed a moderately differentiated adenocarcinoma negative for H. pylori negative for HER2 and MSI stable MMR proficient.  A PET scan on 05/19/2024 showed hypermetabolic activity along the mid body of the stomach but no evidence of metastatic disease and he returned for endoscopic ultrasound on 06/15/2024 which identified the lesion at 10 mm in the greater curvature of the gastric body the site was tattooed, and was felt to be between cT2-T3 disease node-negative.  He was started on FLOT on 07/27/2024 and completed 4 cycles of this on 09/12/2024.  A repeat PET scan on 09/22/2024 showed no evidence of hypermetabolic activity in the stomach. He was offered chemoradiation with Dr. Missy as he was not felt to be a good surgical candidate due to his medical comorbidities.  The patient is interested in having radiotherapy but prefers treatment closer  to home and is seen to proceed with our clinic.  Dr. Davonna rear meet with him tomorrow to discuss systemic therapy during radiation.    PREVIOUS RADIATION  THERAPY: No   PAST MEDICAL HISTORY:  Past Medical History:  Diagnosis Date   Ambulates with cane    straight - uses occasionally   Anxiety    due to the stroke   Arthritis    Back pain    hx of buldging disc   Complication of anesthesia    took a while for him to wake up after previous anesthesia   CVA (cerebral vascular accident) (HCC) 10/07/2020   Diabetes mellitus without complication (HCC)    Family history of adverse reaction to anesthesia    sometimes mom has a hard time waking up   High cholesterol    takes Zocor  daily   Hypertension    takes Benazepril  and HCTZ  daily   Joint pain    Joint swelling    Memory impairment    occassional - from stroke   Myocardial infarction (HCC) 1987   Pneumonia    hx of-80's   Shortness of breath dyspnea    do to pain   Sleep apnea    never had a sleep study,but states Dr. Rennis says he has it   Slurred speech    Stroke (HCC) 08/2013   7 mini-strokes, last stroke 2017   TIA (transient ischemic attack) 2014   x 7    Urinary frequency        PAST SURGICAL HISTORY: Past Surgical History:  Procedure Laterality Date   ABDOMINAL EXPOSURE N/A 06/16/2018   Procedure: ABDOMINAL EXPOSURE;  Surgeon: Oris Krystal FALCON, MD;  Location: [facility] OR;  Service: Vascular;  Laterality: N/A;   ANKLE SURGERY  2008   left ankle-otif-[facility]   ANTERIOR LUMBAR FUSION Bilateral 06/16/2018   Procedure: LUMBAR 4-5 LUMBAR 5-SACRUM 1 ANTERIOR LUMBAR INTERBODY FUSION WITH INSTRUMENTATION AND ALLOGRAFT;  Surgeon: Beuford Anes, MD;  Location: [facility] OR;  Service: Orthopedics;  Laterality: Bilateral;   BACK SURGERY     COLONOSCOPY N/A 03/01/2024   Procedure: COLONOSCOPY;  Surgeon: Eartha Angelia Sieving, MD;  Location: [facility];  Service: Gastroenterology;  Laterality: N/A;   ENTEROSCOPY N/A 04/19/2024   Procedure: ENTEROSCOPY;  Surgeon: Cinderella Deatrice FALCON, MD;  Location: [facility];  Service: Endoscopy;  Laterality: N/A;  9:0AM;ASA 3    ESOPHAGOGASTRODUODENOSCOPY N/A 03/01/2024   Procedure: EGD (ESOPHAGOGASTRODUODENOSCOPY);  Surgeon: Eartha Angelia, Sieving, MD;  [facility]: [facility];  Service: Gastroenterology;  Laterality: N/A;   ESOPHAGOGASTRODUODENOSCOPY N/A 06/15/2024   Procedure: EGD (ESOPHAGOGASTRODUODENOSCOPY);  Surgeon: Burnette Fallow, MD;  Location: [facility];  Service: Gastroenterology;  Laterality: N/A;   EUS N/A 06/15/2024   Procedure: ULTRASOUND, UPPER GI TRACT, ENDOSCOPIC;  Surgeon: Burnette Fallow, MD;  Location: [facility];  Service: Gastroenterology;  Laterality: N/A;  fine needle aspiration   GIVENS CAPSULE STUDY N/A 03/02/2024   Procedure: IMAGING PROCEDURE, GI TRACT, INTRALUMINAL, VIA CAPSULE;  Surgeon: Cindie Carlin POUR, DO;  [facility]: [facility];  Service: Endoscopy;  Laterality: N/A;   HEMATOMA EVACUATION Left 08/13/2018   Procedure: EVACUATION HEMATOMA LEFT ABDOMINAL WALL;  Surgeon: Oris Krystal FALCON, MD;  [facility] OR;  Service: Vascular;  Laterality: Left;   HEMOSTASIS CLIP PLACEMENT  03/01/2024   Procedure: CONTROL OF HEMORRHAGE, GI TRACT, ENDOSCOPIC, BY CLIPPING OR OVERSEWING;  Surgeon: Eartha Angelia Sieving, MD;  Location: [facility];  Service: Gastroenterology;;   IR IMAGING GUIDED PORT INSERTION  06/23/2024   IR RADIOLOGIST EVAL & MGMT  07/27/2018   IR US  GUIDE BX ASP/DRAIN  07/14/2018   JOINT REPLACEMENT     both hips replaced    LUMBAR LAMINECTOMY/DECOMPRESSION MICRODISCECTOMY Right 10/17/2014   Procedure: LUMBAR LAMINECTOMY/DECOMPRESSION MICRODISCECTOMY 2 LEVELS;  Surgeon: Gerldine Maizes, MD;  Location: [facility];  Service: Neurosurgery;  Laterality: Right;  Right L45 L5S1 laminectomy and foraminotomy   MASS EXCISION  09/13/2012   Procedure: EXCISION MASS;  Surgeon: Oneil DELENA Budge, MD;  Location: [facility];  Service: General;  Laterality: N/A;   POLYPECTOMY  03/01/2024   Procedure: POLYPECTOMY;  Surgeon: Eartha Angelia Sieving, MD;  [facility];  Service:  Gastroenterology;;   ROBOTIC ASSITED PARTIAL NEPHRECTOMY Left 03/30/2019   Procedure: XI ROBOTIC ASSITED PARTIAL NEPHRECTOMY POSSIBLE RADICAL NEPHRECTOMY;  Surgeon: Devere Lonni Righter, MD;  Location: [facility];  Service: Urology;  Laterality: Left;   SHOULDER ARTHROSCOPY WITH ROTATOR CUFF REPAIR Right 04/12/2020   Procedure: right shoulder arthroscopy, debridement, mini open rotator cuff tear repair;  Surgeon: Addie Cordella Hamilton, MD;  Location: [facility] OR;  Service: Orthopedics;  Laterality: Right;   TONSILLECTOMY     TOTAL HIP ARTHROPLASTY Right 03/20/2015   TOTAL HIP ARTHROPLASTY Right 03/20/2015   Procedure: TOTAL HIP ARTHROPLASTY ANTERIOR APPROACH;  Surgeon: Evalene JONETTA Chancy, MD;  Location: [facility] OR;  Service: Orthopedics;  Laterality: Right;   TOTAL HIP ARTHROPLASTY Left 01/08/2016   Procedure: TOTAL HIP ARTHROPLASTY ANTERIOR APPROACH;  Surgeon: Evalene JONETTA Chancy, MD;  Location: [facility] OR;  Service: Orthopedics;  Laterality: Left;     FAMILY HISTORY:  Family History  Problem Relation Age of Onset   Heart disease Father    Colon cancer Neg Hx    Colon polyps Neg Hx      SOCIAL HISTORY:  reports that he has been smoking cigarettes. He has a 11.8 pack-year smoking history. He has been exposed to tobacco smoke. He has never used smokeless tobacco. He reports that he does not currently use alcohol. He reports that he does not currently use drugs after having used the following drugs: Cocaine.  The patient is married and lives in [place].  He is accompanied by his wife and mother.   ALLERGIES: Poison ivy extract [poison ivy extract] and Coreg  [carvedilol ]   MEDICATIONS:  Current Outpatient Medications  Medication Sig Dispense Refill   Accu-Chek Softclix Lancets lancets SMARTSIG:Topical 2-3 Times Daily     acetaminophen  (TYLENOL ) 325 MG tablet Take 2 tablets (650 mg total) by mouth every 6 (six) hours as needed for mild pain (pain score 1-3) (or Fever >/= 101).     albuterol  (VENTOLIN  HFA) 108  (90 Base) MCG/ACT inhaler Inhale 2 puffs into the lungs every 6 (six) hours as needed for wheezing or shortness of breath (cough). 8 g 2   amLODipine  (NORVASC ) 2.5 MG tablet Take 1 tablet (2.5 mg total) by mouth daily. 90 tablet 3   aspirin  EC 81 MG tablet Take 1 tablet (81 mg total) by mouth daily with breakfast. Swallow whole. 30 tablet 12   atorvastatin  (LIPITOR) 40 MG tablet Take 1 tablet (40 mg total) by mouth daily. 90 tablet 3   Camphor-Menthol -Methyl Sal (SALONPAS EX) Apply topically. As needed     dexamethasone  (DECADRON ) 4 MG tablet Take 2 tablets (8 mg total) by mouth daily. Start the day after chemotherapy for 2 days. Take with food. 30 tablet 1   dextrose  5 % SOLN 1,000 mL with fluorouracil  5 GM/100ML SOLN Inject into  the vein every 14 (fourteen) days.     diphenoxylate -atropine  (LOMOTIL ) 2.5-0.025 MG tablet Take 2 tablets by mouth 4 (four) times daily as needed for diarrhea or loose stools. 60 tablet 0   DOCETAXEL  IV Inject into the vein every 14 (fourteen) days.     Durvalumab  (IMFINZI  IV) Inject into the vein every 28 (twenty-eight) days.     gabapentin  (NEURONTIN ) 300 MG capsule Take 1 capsule (300 mg total) by mouth at bedtime. 90 capsule 1   JARDIANCE  10 MG TABS tablet Take 1 tablet (10 mg total) by mouth daily. 30 tablet 5   LEUCOVORIN  CALCIUM  IV Inject into the vein every 14 (fourteen) days.     lidocaine -prilocaine  (EMLA ) cream Apply 1 Application topically as needed. 30 g 1   Multiple Vitamin (MULTIVITAMIN) tablet Take 1 tablet by mouth daily.     ondansetron  (ZOFRAN ) 8 MG tablet Take 1 tablet (8 mg total) by mouth every 8 (eight) hours as needed for nausea or vomiting. Start on the third day after chemotherapy. 30 tablet 1   OXALIPLATIN  IV Inject into the vein every 14 (fourteen) days.     pantoprazole  (PROTONIX ) 40 MG tablet TAKE 1 TABLET(40 MG) BY MOUTH TWICE DAILY 60 tablet 2   polyethylene glycol powder (GLYCOLAX /MIRALAX ) 17 GM/SCOOP powder Take 8.5 g by mouth daily.      prochlorperazine  (COMPAZINE ) 10 MG tablet Take 1 tablet (10 mg total) by mouth every 6 (six) hours as needed for nausea or vomiting. 30 tablet 1   rOPINIRole  (REQUIP ) 2 MG tablet Take 1 tablet (2 mg total) by mouth at bedtime. 30 tablet 5   triamcinolone  cream (KENALOG ) 0.1 % Apply 1 Application topically 2 (two) times daily. 30 g 0   umeclidinium-vilanterol (ANORO ELLIPTA ) 62.5-25 MCG/ACT AEPB Inhale 1 puff into the lungs daily. 60 each 5   megestrol  (MEGACE  ES) 625 MG/5ML suspension Take 5 mLs (625 mg total) by mouth daily. (Patient not taking: Reported on 12/06/2024) 150 mL 1   No current facility-administered medications for this encounter.     REVIEW OF SYSTEMS: On review of systems, the patient reports that he is doing pretty well. While he has multiple sources of pain and history of arthritic changes, he is not having abdominal pain, nausea, or difficulty with eating. He reports he can eat anything he wants at this time including spicy foods. He struggles to get in extra protein drinks and tries to look for ones that do not have a dairy base due to intolerance to dairy which causes diarrhea. He has had weight loss over time, but has been able to keep his weight somewhat stable, with about 5 pounds of loss in the last month. No other complaints are verbalized.       PHYSICAL EXAM:  Wt Readings from Last 3 Encounters:  12/06/24 195 lb 9.6 oz (88.7 kg)  11/10/24 200 lb 12.8 oz (91.1 kg)  11/01/24 200 lb 12.8 oz (91.1 kg)   Temp Readings from Last 3 Encounters:  12/06/24 (!) 97.3 F (36.3 C)  11/10/24 97.7 F (36.5 C)  11/02/24 98.6 F (37 C) (Oral)   BP Readings from Last 3 Encounters:  12/06/24 (!) 141/96  11/10/24 (!) 126/94  11/02/24 128/85   Pulse Readings from Last 3 Encounters:  12/06/24 (!) 103  11/10/24 100  11/02/24 99   Pain Assessment Pain Score: 10-Worst pain ever Pain Loc: Generalized/10  In general this is a thin appearing African American male in no acute  distress. He's  alert and oriented x4 and appropriate throughout the examination. Cardiopulmonary assessment is negative for acute distress and he exhibits normal effort. The abdomen is soft, nontender, nondistended.     ECOG = 1-2  0 - Asymptomatic (Fully active, able to carry on all predisease activities without restriction)  1 - Symptomatic but completely ambulatory (Restricted in physically strenuous activity but ambulatory and able to carry out work of a light or sedentary nature. For example, light housework, office work)  2 - Symptomatic, <50% in bed during the day (Ambulatory and capable of all self care but unable to carry out any work activities. Up and about more than 50% of waking hours)  3 - Symptomatic, >50% in bed, but not bedbound (Capable of only limited self-care, confined to bed or chair 50% or more of waking hours)  4 - Bedbound (Completely disabled. Cannot carry on any self-care. Totally confined to bed or chair)  5 - Death   Raylene MM, Creech RH, Tormey DC, et al. [phone]). Toxicity and response criteria of the [facility]. Am. DOROTHA Bridges. Oncol. 5 (6): 649-55    LABORATORY DATA:  Lab Results  Component Value Date   WBC 6.8 11/10/2024   HGB 11.4 (L) 11/10/2024   HCT 37.8 (L) 11/10/2024   MCV 86.7 11/10/2024   PLT 183 11/10/2024   Lab Results  Component Value Date   NA 140 11/10/2024   K 3.9 11/10/2024   CL 103 11/10/2024   CO2 21 (L) 11/10/2024   Lab Results  Component Value Date   ALT 32 11/10/2024   AST 40 11/10/2024   ALKPHOS 140 (H) 11/10/2024   BILITOT 0.5 11/10/2024      RADIOGRAPHY: DG Knee Complete 4 Views Right Result Date: 11/10/2024 EXAM: 4 VIEW(S) XRAY OF THE RIGHT KNEE 11/10/2024 10:02:00 PM COMPARISON: None available. CLINICAL HISTORY: Fall, knee pain FINDINGS: BONES AND JOINTS: No acute fracture. No malalignment. No significant joint effusion. SOFT TISSUES: The soft tissues are unremarkable. IMPRESSION: 1. No  significant abnormality. Electronically signed by: Franky Crease MD 11/10/2024 10:14 PM EST RP Workstation: [id number]   DG Knee Complete 4 Views Left Result Date: 11/10/2024 EXAM: 4 OR MORE VIEW(S) Xray of the Left Knee 11/10/2024 10:02:00 PM COMPARISON: None available. CLINICAL HISTORY: Fall, knee pain FINDINGS: BONES AND JOINTS: No acute fracture. No malalignment. No significant joint effusion. SOFT TISSUES: The soft tissues are unremarkable. IMPRESSION: 1. No significant abnormality. Electronically signed by: Franky Crease MD 11/10/2024 10:13 PM EST RP Workstation: [id number]   DG Chest 1 View Result Date: 11/10/2024 EXAM: 1 VIEW(S) XRAY OF THE CHEST 11/10/2024 10:02:00 PM COMPARISON: 02/29/2024 CLINICAL HISTORY: sob sob FINDINGS: LINES, TUBES AND DEVICES: Right port-a-cath in place with the tip in the SVC. LUNGS AND PLEURA: Mild stable chronic elevation of the left hemidiaphragm. No focal pulmonary opacity. No pleural effusion. No pneumothorax. HEART AND MEDIASTINUM: No acute abnormality of the cardiac and mediastinal silhouettes. BONES AND SOFT TISSUES: No acute osseous abnormality. IMPRESSION: 1. No acute cardiopulmonary process. 2. Mild chronic elevation of the left hemidiaphragm, unchanged from prior. Electronically signed by: Franky Crease MD 11/10/2024 10:12 PM EST RP Workstation: [id number]       IMPRESSION/PLAN: 1. cT2-3N0M0, moderately differentiated adenocarcinoma of the greater curvature of the stomach with response to neoadjuvant chemotherapy. Dr. Dewey discusses the pathology findings and reviews the nature of carcinoma involving the stomach. He reviews the patient's course to date and as the patient is not a candidate for gastrectomy, offers a course of  radiotherapy.  Dr. Dewey also recommends updating his CT scan of the chest, abdomen, and pelvis to ensure he has not had any progression prior to proceeding with definitive treatment. We anticipate that Dr. Davonna will recommend  chemosensitization as well when she sees him tomorrow. We discussed the risks, benefits, short, and long term effects of radiotherapy, as well as the curative intent, and the patient is interested in proceeding. Dr. Dewey discusses the delivery and logistics of radiotherapy and anticipates a course of 5 1/2 weeks of radiotherapy to the stomach. Written consent is obtained and placed in the chart, a copy was provided to the patient. The patient will be contacted to coordinate treatment planning by our simulation department. We anticipate starting treatment on 12/19/24.  In a visit lasting 60 minutes, greater than 50% of the time was spent face to face discussing the patient's condition, in preparation for the discussion, and coordinating the patient's care.   The above documentation reflects my direct findings during this shared patient visit. Please see the separate note by Dr. Dewey on this date for the remainder of the patient's plan of care.    Donald KYM Husband, [facility]   **Disclaimer: This note was dictated with voice recognition software. Similar sounding words can inadvertently be transcribed and this note may contain transcription errors which may not have been corrected upon publication of note.** "

## 2024-12-06 ENCOUNTER — Ambulatory Visit
Admission: RE | Admit: 2024-12-06 | Discharge: 2024-12-06 | Disposition: A | Discharge status: Home / Self Care | Source: Ambulatory Visit | Attending: Radiation Oncology | Admitting: Radiation Oncology

## 2024-12-06 ENCOUNTER — Encounter: Payer: Self-pay | Admitting: Radiation Oncology

## 2024-12-06 VITALS — BP 141/96 | HR 103 | Temp 97.3°F | Resp 18 | Wt 195.6 lb

## 2024-12-06 DIAGNOSIS — I10 Essential (primary) hypertension: Secondary | ICD-10-CM | POA: Insufficient documentation

## 2024-12-06 DIAGNOSIS — Z79899 Other long term (current) drug therapy: Secondary | ICD-10-CM | POA: Insufficient documentation

## 2024-12-06 DIAGNOSIS — C166 Malignant neoplasm of greater curvature of stomach, unspecified: Secondary | ICD-10-CM | POA: Insufficient documentation

## 2024-12-06 DIAGNOSIS — R350 Frequency of micturition: Secondary | ICD-10-CM | POA: Insufficient documentation

## 2024-12-06 DIAGNOSIS — Z7982 Long term (current) use of aspirin: Secondary | ICD-10-CM | POA: Diagnosis not present

## 2024-12-06 DIAGNOSIS — E119 Type 2 diabetes mellitus without complications: Secondary | ICD-10-CM | POA: Insufficient documentation

## 2024-12-06 DIAGNOSIS — E7800 Pure hypercholesterolemia, unspecified: Secondary | ICD-10-CM | POA: Diagnosis not present

## 2024-12-06 DIAGNOSIS — Z8701 Personal history of pneumonia (recurrent): Secondary | ICD-10-CM | POA: Insufficient documentation

## 2024-12-06 DIAGNOSIS — I252 Old myocardial infarction: Secondary | ICD-10-CM | POA: Insufficient documentation

## 2024-12-06 DIAGNOSIS — Z8673 Personal history of transient ischemic attack (TIA), and cerebral infarction without residual deficits: Secondary | ICD-10-CM | POA: Insufficient documentation

## 2024-12-06 DIAGNOSIS — D509 Iron deficiency anemia, unspecified: Secondary | ICD-10-CM | POA: Diagnosis not present

## 2024-12-06 DIAGNOSIS — Z85528 Personal history of other malignant neoplasm of kidney: Secondary | ICD-10-CM | POA: Insufficient documentation

## 2024-12-06 DIAGNOSIS — G4730 Sleep apnea, unspecified: Secondary | ICD-10-CM | POA: Insufficient documentation

## 2024-12-06 DIAGNOSIS — F17210 Nicotine dependence, cigarettes, uncomplicated: Secondary | ICD-10-CM | POA: Diagnosis not present

## 2024-12-06 NOTE — Progress Notes (Signed)
 " Patient Care Team: [name], MD as PCP - General (Internal Medicine) Francyne Headland, MD as PCP - Cardiology (Cardiology) Beuford Anes, MD as Consulting Physician (Orthopedic Surgery) Devere Lonni Righter, MD as Consulting Physician (Urology) Skeet Juliene SAUNDERS, DO as Consulting Physician (Neurology)  Clinic Day:  12/07/2024  Referring physician: [name], MD   CHIEF COMPLAINT:  CC: Gastric adenocarcinoma    ASSESSMENT & PLAN:   Assessment & Plan: Raymond Barrera.  is a 64 y.o. 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   Oncology history as below Likely stage I-T2N0M0 (T2-T3 per EUS report) PET scan with no evidence of metastatic disease Evaluated by surgical oncology at [facility] and recommended to follow-up after neoadjuvant chemoimmunotherapy 07/27/2024: Started on D-FLOT regimen per [facility]TER[facility] trial    -Patient not a surgical candidate at recent evaluation by surgery at [facility] because of high risk for subtotal versus total gastrectomy due to his comorbidities.  Recommended chemo RT -Evaluated patient today.  Will discontinue D- FLOT at this time -Discussed risk versus benefits of chemo radiation in detail with a curative intent.  Patient has worsening peripheral neuropathy and had recent falls of unknown etiology.  Will not consider carboplatin and paclitaxel at this time as chemo backbone.  Would prefer doing capecitabine  for better tolerance, also considering patient having some response with D-FLOT, there is expected response with capecitabine  along with radiation. -Discussed risk versus benefits in detail. -Xeloda  (capecitabine ) commonly causes hand-foot syndrome, leading to redness, pain, peeling, or swelling of the palms and soles, along with diarrhea, nausea, and fatigue. It can also cause low blood counts,  increasing the risk of infection, anemia, and bleeding, as well as mouth sores and loss of appetite. Less common but serious side effects include chest pain or heart problems, severe diarrhea or dehydration, and liver function abnormalities. - Labs reviewed today: Creatinine: 1.32, calcium : 10.8, corrected: 11, CBC: Normal WBC and platelets, hemoglobin: 11.4 -Patient met with Dr. Dewey yesterday for radiation oncology.  Will relay the above plan to him. -Will send prescription for Xeloda  1500 mg twice daily on days of radiation   Return to clinic prior to second cycle of chemo RT to assess for tolerance.  Cancer-related cachexia Significant weight loss despite improved appetite and increased intake. Chemotherapy and radiation may further compromise nutritional status.  - Continued appetite stimulant; provided additional refills. - Discussed possible need for temporary feeding tube if weight loss persists or oral intake becomes insufficient during treatment.  Will refer to surgery for this - Provided anticipatory guidance regarding temporary nature of feeding tube and criteria for removal. - Encouraged continued oral intake and regular eating even if feeding tube is placed.  Chemotherapy-induced peripheral neuropathy Worsening peripheral neuropathy interfering with eating and daily activities. Gabapentin  minimally effective.  Patient also has baseline peripheral neuropathy from diabetes.  - Increased gabapentin  dosing to twice daily. - Sent new prescription for gabapentin . - Provided anticipatory guidance to report worsening symptoms, especially during chemotherapy and radiation.  Hypercalcemia Likely secondary to dehydration - Will administer 1 L normal saline today or tomorrow.  Chemotherapy-induced nausea and diarrhea Mild nausea and occasional diarrhea post-treatment, no vomiting.  -As needed Zofran  and Imodium are available for the patient.  Tobacco use Patient is a chronic  smoker and is currently smoking 1 to 2 cigarettes/day   - Encouraged his attempts to abstain from  smoking  Chronic kidney disease stage III Chronic kidney disease likely secondary to only 1 functioning kidney  Follows with Dr. 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   - Currently in remission    The patient understands the plans discussed today and is in agreement with them.  He knows to contact our office if he develops concerns prior to his next appointment.  The total time spent in the appointment was 40 minutes for the encounter with patient, including review of chart and various tests results, discussions about plan of care and coordination of care plan    [name], MD  [facility] [place] - A DEPT OF [name] [address] [place] [address] Dept: [phone] Dept Fax: [phone]   Orders Placed This Encounter  Procedures   Iron  and TIBC ([facility] ONLY)    Standing Status:   Future    Expected Date:   12/19/2024    Expiration Date:   03/19/2025   Ferritin    Standing Status:   Future    Expected Date:   12/19/2024    Expiration Date:   03/19/2025   Vitamin B12    Standing Status:   Future    Expected Date:   12/19/2024    Expiration Date:   03/19/2025   Folate    Standing Status:   Future    Expected Date:   12/19/2024    Expiration Date:   03/19/2025     ONCOLOGY HISTORY:   Diagnosis: Stage I/II (T2-T3 N0 M0) gastric adenocarcinoma  -04/19/2024: Stomach biopsy:  - Pathology: Invasive moderately differentiated adenocarcinoma. Reactive gastropathy with intestinal metaplasia showing focal dysplasia. Negative for H. Pylori -05/11/2024: [facility] NGS:  CLDN18: Positive, PD-L1: Positive, CPS: 10, HER2: Negative   -MSI: Stable, MMR: Proficient, TMB: Low, 5  mut/Mb   -BRAF, RET, NTRK 1/2/3, ARID1A, BCR, CCN E1, CDH 1, EBER, EGFR,  FGFR2, KRAS, MET, PIK3CA, RHOA: Negative -05/19/2024: PET scan: Only slight area of asymmetric uptake along the midbody of the stomach with an adjacent metallic clip. No additional areas of abnormal uptake at this time.  -06/15/2024: EUS : Staging gastric adenocarcinoma at least T2 (maybe early T3) N0 Mx by EUS.  -06/23/2024: IR guided port placement -07/12/2024: Evaluated by surgical oncology at [facility]: Thought to be borderline surgical candidate and will re-evaluate after 4 cycles of chemo immunotherapy -07/21/2024: PET: Persistent mildwall thickening involving the gastric fundus and body with mild, non-specific tracer uptake (SUV max 2.5, previously 3.6). No hypermetabolic nodal metastasis or distant metastatic disease. -07/27/2024- 11/01/2024: Neoadjuvant D-FLOT -09/22/2024: PET scan: No evidence of metabolically active disease related to gastric cancer.  -10/13/2024: EUS: Superficial ulcer in the body of the stomach and lesser curve of the  Stomach. There is no adenopathy and the ulcer measures 1.5cm by EUS, probable T2. -11/04/2024: Evaluated by Dr. Debora (surgery) and patient is not a surgical candidate due to high risk for subtotal versus total gastrectomy due to comorbidities.  Recommended chemoRT - 12/06/2024: Evaluated by Dr. Dewey for radiation.   Current Treatment: Plan chemoradiation with capecitabine  INTERVAL HISTORY:   Discussed the use of AI scribe software for clinical note transcription with the patient, who gave verbal consent to proceed.  History of Present Illness Raymond Barrera. Raymond Barrera is a 64 year old male with persistent, non-resected gastric  cancer undergoing chemotherapy who presents for oncology follow-up due to ongoing weight loss, worsening peripheral neuropathy, and functional decline.  He continues to experience significant unintentional weight loss, with weight decreasing from 208  lbs to 195.6 lbs despite eating two to three times daily and improved appetite on an appetite stimulant. Family is concerned about ongoing weight loss and the potential need for enteral feeding if oral intake becomes insufficient, particularly with upcoming chemoradiation. He is able to eat but not as frequently as desired, and there is concern that treatment-related inflammation may further impair nutritional status.  He has had nine recent falls, attributed to persistent weakness, imbalance, and difficulty ambulating even with a cane. He remains unsteady and unable to stand upright, and recently developed soreness to his backside after a fall.  He has worsening chemotherapy-induced peripheral neuropathy, with progressive numbness and tingling in his hands now severely limiting his ability to hold utensils and feed himself. He is currently taking gabapentin , but symptoms have worsened, and he is unable to use his hands effectively during meals, frequently dropping food and utensils.  He shares ambivalence about continuing aggressive therapy, stating I don't want to do it no more, but remains motivated by his desire for healing and time with family.    I have reviewed the past medical history, past surgical history, social history and family history with the patient and they are unchanged from previous note.  ALLERGIES:  is allergic to poison ivy extract [poison ivy extract] and coreg  [carvedilol ].  MEDICATIONS:  Current Outpatient Medications  Medication Sig Dispense Refill   Accu-Chek Softclix Lancets lancets SMARTSIG:Topical 2-3 Times Daily     acetaminophen  (TYLENOL ) 325 MG tablet Take 2 tablets (650 mg total) by mouth every 6 (six) hours as needed for mild pain (pain score 1-3) (or Fever >/= 101).     albuterol  (VENTOLIN  HFA) 108 (90 Base) MCG/ACT inhaler Inhale 2 puffs into the lungs every 6 (six) hours as needed for wheezing or shortness of breath (cough). 8 g 2   amLODipine  (NORVASC )  2.5 MG tablet Take 1 tablet (2.5 mg total) by mouth daily. 90 tablet 3   aspirin  EC 81 MG tablet Take 1 tablet (81 mg total) by mouth daily with breakfast. Swallow whole. 30 tablet 12   atorvastatin  (LIPITOR) 40 MG tablet Take 1 tablet (40 mg total) by mouth daily. 90 tablet 3   Camphor-Menthol -Methyl Sal (SALONPAS EX) Apply topically. As needed     dexamethasone  (DECADRON ) 4 MG tablet Take 2 tablets (8 mg total) by mouth daily. Start the day after chemotherapy for 2 days. Take with food. 30 tablet 1   dextrose  5 % SOLN 1,000 mL with fluorouracil  5 GM/100ML SOLN Inject into the vein every 14 (fourteen) days.     diphenoxylate -atropine  (LOMOTIL ) 2.5-0.025 MG tablet Take 2 tablets by mouth 4 (four) times daily as needed for diarrhea or loose stools. 60 tablet 0   DOCETAXEL  IV Inject into the vein every 14 (fourteen) days.     Durvalumab  (IMFINZI  IV) Inject into the vein every 28 (twenty-eight) days.     gabapentin  (NEURONTIN ) 300 MG capsule Take 1 capsule (300 mg total) by mouth at bedtime. 90 capsule 1   JARDIANCE  10 MG TABS tablet Take 1 tablet (10 mg total) by mouth daily. 30 tablet 5   LEUCOVORIN  CALCIUM  IV Inject into the vein every 14 (fourteen) days.     lidocaine -prilocaine  (EMLA ) cream Apply 1 Application topically as needed. 30 g 1   megestrol  (MEGACE   ES) 625 MG/5ML suspension Take 5 mLs (625 mg total) by mouth daily. 150 mL 1   Multiple Vitamin (MULTIVITAMIN) tablet Take 1 tablet by mouth daily.     ondansetron  (ZOFRAN ) 8 MG tablet Take 1 tablet (8 mg total) by mouth every 8 (eight) hours as needed for nausea or vomiting. Start on the third day after chemotherapy. 30 tablet 1   OXALIPLATIN  IV Inject into the vein every 14 (fourteen) days.     oxyCODONE -acetaminophen  (PERCOCET/ROXICET) 5-325 MG tablet Take 1 tablet by mouth every 8 (eight) hours as needed for severe pain (pain score 7-10). 90 tablet 0   pantoprazole  (PROTONIX ) 40 MG tablet TAKE 1 TABLET(40 MG) BY MOUTH TWICE DAILY 60 tablet  2   polyethylene glycol powder (GLYCOLAX /MIRALAX ) 17 GM/SCOOP powder Take 8.5 g by mouth daily.     prochlorperazine  (COMPAZINE ) 10 MG tablet Take 1 tablet (10 mg total) by mouth every 6 (six) hours as needed for nausea or vomiting. 30 tablet 1   rOPINIRole  (REQUIP ) 2 MG tablet Take 1 tablet (2 mg total) by mouth at bedtime. 30 tablet 5   triamcinolone  cream (KENALOG ) 0.1 % Apply 1 Application topically 2 (two) times daily. 30 g 0   umeclidinium-vilanterol (ANORO ELLIPTA ) 62.5-25 MCG/ACT AEPB Inhale 1 puff into the lungs daily. 60 each 5   No current facility-administered medications for this visit.    VITALS:    Performance status (ECOG): 1 - Symptomatic but completely ambulatory  PHYSICAL EXAM:   GENERAL:alert, no distress and comfortable LYMPH:  no palpable lymphadenopathy in the cervical, axillary or inguinal LUNGS: clear to auscultation and percussion with normal breathing effort HEART: regular rate & rhythm and no murmurs and no lower extremity edema ABDOMEN:abdomen soft, non-tender and normal bowel sounds Musculoskeletal:no cyanosis of digits and no clubbing  NEURO: alert & oriented x 3 with fluent speech  LABORATORY DATA:  I have reviewed the data as listed    Component Value Date/Time   NA 137 12/07/2024 0925   NA 139 01/26/2024 1620   K 4.2 12/07/2024 0925   CL 102 12/07/2024 0925   CO2 27 12/07/2024 0925   GLUCOSE 110 (H) 12/07/2024 0925   BUN 24 (H) 12/07/2024 0925   BUN 28 (H) 01/26/2024 1620   CREATININE 1.16 12/07/2024 0925   CALCIUM  11.0 (H) 12/07/2024 0925   PROT 7.3 12/07/2024 0925   PROT 7.2 01/01/2024 1012   ALBUMIN  3.7 12/07/2024 0925   ALBUMIN  4.4 01/01/2024 1012   AST 18 12/07/2024 0925   ALT 17 12/07/2024 0925   ALKPHOS 170 (H) 12/07/2024 0925   BILITOT 0.4 12/07/2024 0925   BILITOT <0.2 01/01/2024 1012   GFRNONAA >60 12/07/2024 0925   GFRAA 40 (L) 04/12/2020 1425    Lab Results  Component Value Date   WBC 10.7 (H) 12/07/2024   NEUTROABS  8.6 (H) 12/07/2024   HGB 9.6 (L) 12/07/2024   HCT 32.3 (L) 12/07/2024   MCV 91.0 12/07/2024   PLT 511 (H) 12/07/2024      Chemistry      Component Value Date/Time   NA 137 12/07/2024 0925   NA 139 01/26/2024 1620   K 4.2 12/07/2024 0925   CL 102 12/07/2024 0925   CO2 27 12/07/2024 0925   BUN 24 (H) 12/07/2024 0925   BUN 28 (H) 01/26/2024 1620   CREATININE 1.16 12/07/2024 0925      Component Value Date/Time   CALCIUM  11.0 (H) 12/07/2024 0925   ALKPHOS 170 (H) 12/07/2024 9074  AST 18 12/07/2024 0925   ALT 17 12/07/2024 0925   BILITOT 0.4 12/07/2024 0925   BILITOT <0.2 01/01/2024 1012      Latest Reference Range & Units 09/27/24 07:58  TSH 0.350 - 4.500 uIU/mL 1.320    RADIOGRAPHIC STUDIES: I have personally reviewed the radiological images as listed and agreed with the findings in the report.   "

## 2024-12-07 ENCOUNTER — Ambulatory Visit (HOSPITAL_COMMUNITY)
Admission: RE | Admit: 2024-12-07 | Discharge: 2024-12-07 | Disposition: A | Discharge status: Home / Self Care | Source: Ambulatory Visit | Attending: Radiation Oncology | Admitting: Radiation Oncology

## 2024-12-07 ENCOUNTER — Inpatient Hospital Stay: Attending: Oncology | Admitting: Oncology

## 2024-12-07 ENCOUNTER — Inpatient Hospital Stay

## 2024-12-07 VITALS — BP 124/97 | HR 110 | Temp 96.9°F | Resp 20

## 2024-12-07 DIAGNOSIS — E88A Wasting disease (syndrome) due to underlying condition: Secondary | ICD-10-CM | POA: Insufficient documentation

## 2024-12-07 DIAGNOSIS — R110 Nausea: Secondary | ICD-10-CM | POA: Insufficient documentation

## 2024-12-07 DIAGNOSIS — Z720 Tobacco use: Secondary | ICD-10-CM | POA: Diagnosis not present

## 2024-12-07 DIAGNOSIS — C641 Malignant neoplasm of right kidney, except renal pelvis: Secondary | ICD-10-CM | POA: Insufficient documentation

## 2024-12-07 DIAGNOSIS — R634 Abnormal weight loss: Secondary | ICD-10-CM | POA: Insufficient documentation

## 2024-12-07 DIAGNOSIS — N183 Chronic kidney disease, stage 3 unspecified: Secondary | ICD-10-CM | POA: Insufficient documentation

## 2024-12-07 DIAGNOSIS — T451X5A Adverse effect of antineoplastic and immunosuppressive drugs, initial encounter: Secondary | ICD-10-CM | POA: Insufficient documentation

## 2024-12-07 DIAGNOSIS — C169 Malignant neoplasm of stomach, unspecified: Secondary | ICD-10-CM | POA: Insufficient documentation

## 2024-12-07 DIAGNOSIS — C166 Malignant neoplasm of greater curvature of stomach, unspecified: Secondary | ICD-10-CM | POA: Insufficient documentation

## 2024-12-07 DIAGNOSIS — E1122 Type 2 diabetes mellitus with diabetic chronic kidney disease: Secondary | ICD-10-CM | POA: Insufficient documentation

## 2024-12-07 DIAGNOSIS — F17210 Nicotine dependence, cigarettes, uncomplicated: Secondary | ICD-10-CM | POA: Insufficient documentation

## 2024-12-07 DIAGNOSIS — N1832 Chronic kidney disease, stage 3b: Secondary | ICD-10-CM | POA: Diagnosis not present

## 2024-12-07 DIAGNOSIS — Z905 Acquired absence of kidney: Secondary | ICD-10-CM | POA: Insufficient documentation

## 2024-12-07 DIAGNOSIS — D500 Iron deficiency anemia secondary to blood loss (chronic): Secondary | ICD-10-CM

## 2024-12-07 DIAGNOSIS — G620 Drug-induced polyneuropathy: Secondary | ICD-10-CM | POA: Insufficient documentation

## 2024-12-07 DIAGNOSIS — R197 Diarrhea, unspecified: Secondary | ICD-10-CM | POA: Insufficient documentation

## 2024-12-07 DIAGNOSIS — G6289 Other specified polyneuropathies: Secondary | ICD-10-CM

## 2024-12-07 LAB — CBC WITH DIFFERENTIAL/PLATELET
Abs Immature Granulocytes: 0.04 K/uL (ref 0.00–0.07)
Basophils Absolute: 0.1 K/uL (ref 0.0–0.1)
Basophils Relative: 1 %
Eosinophils Absolute: 0.2 K/uL (ref 0.0–0.5)
Eosinophils Relative: 2 %
HCT: 32.3 % — ABNORMAL LOW (ref 39.0–52.0)
Hemoglobin: 9.6 g/dL — ABNORMAL LOW (ref 13.0–17.0)
Immature Granulocytes: 0 %
Lymphocytes Relative: 10 %
Lymphs Abs: 1.1 K/uL (ref 0.7–4.0)
MCH: 27 pg (ref 26.0–34.0)
MCHC: 29.7 g/dL — ABNORMAL LOW (ref 30.0–36.0)
MCV: 91 fL (ref 80.0–100.0)
Monocytes Absolute: 0.7 K/uL (ref 0.1–1.0)
Monocytes Relative: 7 %
Neutro Abs: 8.6 K/uL — ABNORMAL HIGH (ref 1.7–7.7)
Neutrophils Relative %: 80 %
Platelets: 511 K/uL — ABNORMAL HIGH (ref 150–400)
RBC: 3.55 MIL/uL — ABNORMAL LOW (ref 4.22–5.81)
RDW: 21.2 % — ABNORMAL HIGH (ref 11.5–15.5)
WBC: 10.7 K/uL — ABNORMAL HIGH (ref 4.0–10.5)
nRBC: 0 % (ref 0.0–0.2)

## 2024-12-07 LAB — COMPREHENSIVE METABOLIC PANEL WITH GFR
ALT: 17 U/L (ref 0–44)
AST: 18 U/L (ref 15–41)
Albumin: 3.7 g/dL (ref 3.5–5.0)
Alkaline Phosphatase: 170 U/L — ABNORMAL HIGH (ref 38–126)
Anion gap: 9 (ref 5–15)
BUN: 24 mg/dL — ABNORMAL HIGH (ref 8–23)
CO2: 27 mmol/L (ref 22–32)
Calcium: 11 mg/dL — ABNORMAL HIGH (ref 8.9–10.3)
Chloride: 102 mmol/L (ref 98–111)
Creatinine, Ser: 1.16 mg/dL (ref 0.61–1.24)
GFR, Estimated: >60 mL/min
Glucose, Bld: 110 mg/dL — ABNORMAL HIGH (ref 70–99)
Potassium: 4.2 mmol/L (ref 3.5–5.1)
Sodium: 137 mmol/L (ref 135–145)
Total Bilirubin: 0.4 mg/dL (ref 0.0–1.2)
Total Protein: 7.3 g/dL (ref 6.5–8.1)

## 2024-12-07 LAB — MAGNESIUM: Magnesium: 1.9 mg/dL (ref 1.7–2.4)

## 2024-12-07 MED ORDER — HEPARIN SOD (PORK) LOCK FLUSH 100 UNIT/ML IV SOLN
INTRAVENOUS | Status: AC
  Filled 2024-12-07: qty 5

## 2024-12-07 MED ORDER — HEPARIN SOD (PORK) LOCK FLUSH 100 UNIT/ML IV SOLN
500.0000 [IU] | Freq: Once | INTRAVENOUS | Status: AC
  Administered 2024-12-07: 500 [IU] via INTRAVENOUS

## 2024-12-07 MED ORDER — IOHEXOL 300 MG/ML  SOLN
100.0000 mL | Freq: Once | INTRAMUSCULAR | Status: AC | PRN
  Administered 2024-12-07: 100 mL via INTRAVENOUS

## 2024-12-07 NOTE — Patient Instructions (Addendum)
 [facility] at [facility] Discharge Instructions   You were seen and examined today by Dr. Davonna.  She reviewed the results of your lab work which are normal/stable.   She discussed starting treatment for you with chemotherapy and radiation therapy. The chemotherapy drugs you will be receiving are called Taxol and carboplatin. These drugs are given weekly (once a week) in the clinic. Radiation treatments are given 5 days a week (Monday-Friday). We will give the chemotherapy as long as you are receiving radiation treatments.   Return as scheduled.    Thank you for choosing [facility] at [facility] to provide your oncology and hematology care.  To afford each patient quality time with our provider, please arrive at least 15 minutes before your scheduled appointment time.   If you have a lab appointment with the Cancer Center please come in thru the Main Entrance and check in at the main information desk.  You need to re-schedule your appointment should you arrive 10 or more minutes late.  We strive to give you quality time with our providers, and arriving late affects you and other patients whose appointments are after yours.  Also, if you no show three or more times for appointments you may be dismissed from the clinic at the providers discretion.     Again, thank you for choosing [facility].  Our hope is that these requests will decrease the amount of time that you wait before being seen by our physicians.       _____________________________________________________________  Should you have questions after your visit to [facility], please contact our office at [phone] and follow the prompts.  Our office hours are 8:00 a.m. and 4:30 p.m. Monday - Friday.  Please note that voicemails left after 4:00 p.m. may not be returned until the following business day.  We are closed weekends and major holidays.  You do have  access to a nurse 24-7, just call the main number to the clinic [phone] and do not press any options, hold on the line and a nurse will answer the phone.    For prescription refill requests, have your pharmacy contact our office and allow 72 hours.    Due to Covid, you will need to wear a mask upon entering the hospital. If you do not have a mask, a mask will be given to you at the Main Entrance upon arrival. For doctor visits, patients may have 1 support person age 37 or older with them. For treatment visits, patients can not have anyone with them due to social distancing guidelines and our immunocompromised population.

## 2024-12-08 ENCOUNTER — Other Ambulatory Visit: Payer: Self-pay | Admitting: *Deleted

## 2024-12-08 ENCOUNTER — Encounter: Payer: Self-pay | Admitting: *Deleted

## 2024-12-08 ENCOUNTER — Telehealth: Payer: Self-pay | Admitting: *Deleted

## 2024-12-08 ENCOUNTER — Other Ambulatory Visit: Payer: Self-pay | Admitting: Oncology

## 2024-12-08 ENCOUNTER — Inpatient Hospital Stay

## 2024-12-08 VITALS — BP 163/100 | HR 93 | Temp 97.4°F | Resp 20

## 2024-12-08 DIAGNOSIS — C169 Malignant neoplasm of stomach, unspecified: Secondary | ICD-10-CM

## 2024-12-08 DIAGNOSIS — R634 Abnormal weight loss: Secondary | ICD-10-CM

## 2024-12-08 MED ORDER — OXYCODONE-ACETAMINOPHEN 5-325 MG PO TABS: 1.0000 | ORAL_TABLET | Freq: Three times a day (TID) | ORAL | 0 refills | Status: DC | PRN

## 2024-12-08 MED ORDER — SODIUM CHLORIDE 0.9 % IV SOLN: INTRAVENOUS | Status: DC

## 2024-12-08 NOTE — Progress Notes (Signed)
 Patient tolerated hydration with no complaints voiced.  Port site clean and dry with good blood return noted before and after hydration.  No bruising or swelling noted with port.  Band aid applied.  VSS with discharge and left ambulatory with no s/s of distress noted. All follow ups as scheduled.   Raymond Barrera

## 2024-12-08 NOTE — Telephone Encounter (Signed)
" ° °  NURSING SYMPTOM TRIAGE ASSESSMENT & DISPOSITION  Mode of interaction:  Telephone Date of symptom triage interaction:  12/08/2024 Time of symptom triage interaction:  9:30  Cancer diagnosis:  No diagnosis found. [No matching plan found] Last treatment date:  11/01/2024 Oral chemotherapy:  No  Symptom Triage Protocol:  Pain  History of the problem:  Pain Pain Rating (0-10): Currently 9, on average 9, at worst 10, at best 6  Pain Location: Coccyx, left shoulder, neck  Pain Quality: Sharp  Associated Pain Symptoms: None  When Did The Pain Start: 1 week  Is the Pain Constant: Yes  Does the Pain Flare:   Yes, With activity  Does Anything Make the Pain Worse: Yes, Movement  Does Anything Make the Pain Better:  No     Assess for Changes in ADLs: Patient reports 2 falls where he was not evaluated afterward     In the past, what treatments have you tried for your pain: Tylenol   Did these treatments help: No  Additional commentary: NA   Triage Nurse Guidance Severity Index  Seek emergency care, call an ambulance immediately for: Signs/symptoms of acute injury, spinal cord compression, pathologic fracture, infection, or other life threatening problem Sudden onset of severe weakness or unrelenting localized pain Inability to ambulate or decreased sensation in extremities Loss of control of bowel or bladder Chest pain  Seek medical care within two to four hours for: Sudden onset of moderate to severe pain Pain not responsive to current medication regimen Pain that interferes with mobility  Seek medical care within twenty-four hours for: Mild to moderate pain that has been increasing Pain that is interfering with activity or sleep  Follow home care instructions for: Mild to moderate aches and pains Notify the provider if no improvement within twenty-four hours   Provider Consulted  Provider name and credentials: [name], MD  Provider instruction: Patient will be brought in  today for fluids due to increased calcium  and will be assessed by Dr. Dry and recommendations will be given at that time.   Patient Instruction  Disclaimer:  Patient specific and [employer] instruction provided verbally and sent through MyChart for active users.    Report the following problems No improvement in pain Pain that does not subside with interventions Other side effects, such as sedation, nausea, or constipation  Seek emergency care immediately if any of the following occur: Excruciating pain Immobility Low back pain associated with loss of bladder or bowel control; bilateral extremity weakness  Teach Back Method used:  Yes   Nurse Triage Priority:  Urgent (obtain medical orders as indicated with instruction for 8-24 hour or sooner follow-up)  Barriers to Care:  None identified  Nurse Triage Disposition:  In-person follow-up visit:  12/08/2024  Protocol Source:  Ruthine HERO., & Eldonna CANDIE Pee.). (2019). Telephone triage for oncology nurses (3rd ed.). Oncology Nursing Society.  "

## 2024-12-08 NOTE — Addendum Note (Signed)
 Encounter addended by: Dewey Rush, MD on: 12/08/2024 2:27 PM  Actions taken: Clinical Note Signed

## 2024-12-09 ENCOUNTER — Ambulatory Visit: Admitting: General Surgery

## 2024-12-09 ENCOUNTER — Ambulatory Visit: Admitting: Internal Medicine

## 2024-12-11 MED ORDER — CAPECITABINE 500 MG PO TABS: 625.0000 mg/m2 | ORAL_TABLET | Freq: Two times a day (BID) | ORAL | 0 refills | Status: DC

## 2024-12-11 MED ORDER — UREA 10 % EX CREA: TOPICAL_CREAM | CUTANEOUS | 0 refills | Status: AC | PRN

## 2024-12-11 NOTE — Progress Notes (Unsigned)
 "  Cardiology Office Note    Date:  12/11/2024  ID:  Raymond CHRISTELLA Shona Mickey., DOB [birth year], MRN [id number] PCP:  [name], MD  Cardiologist:  [name], MD  Electrophysiologist:  None   Chief Complaint: ***  History of Present Illness: .    Raymond Hermiz. is a 64 y.o. 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   In 2018 patient had left internal capsule ischemic stroke, patient has some mild right hand paresis as well as some difficulty with speech and poor short-term memory.  Prior to that in 2014 he had multiple mini strokes in setting of severe hypertension.  Echocardiogram in July 2019 indicated LVEF of 2025% with severe diffuse hypokinesis and grade 1 DD.  Lexiscan  Myoview  in July 2019 with a EF 24%, no ST segment deviation, no T wave inversion, high risk study.   In 01/2023 patient had TIA in setting of cocaine induced hypertensive urgency.  He presented with weakness to the right hand and worsening speech.  MRI/MRI of head did not show evidence of acute stroke or LVO but several abnormalities of the intracranial circulation were identified.  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  During hospitalization he had an echocardiogram that showed an EF of 30 to 35%, global hypokinesis, mild LVH, no significant valve abnormality seen.  Ascending aorta was mildly dilated at 41 mm.   Patient was seen in clinic by Dr. Balding in March 2024, patient was doing well that time with NYHA functional class I.  He is on Jardiance , ramipril  as GDMT.  Not on  aldosterone antagonist or Entresto  due to renal dysfunction.  It was thought that his varying degrees of blood pressure and use of cocaine was likely the reason for wide variation in his ejection fraction over the prior year.  Event monitor was ordered given recent TIA with history of CVA and his heart monitor was completed on 02/17/2023 showing presence of occasional monomorphic PVCs representing 2% of all recorded beats.  Symptom triggered recordings generally correlate with periods of frequent PVCs.  There was no atrial fibrillation noted.  PVCs were more active during the daytime and generally diminished in frequency at night.  He was started on carvedilol  3.125 twice daily.  In 03/2023 patient notified the office that he was having pruritus, felt to be related to possibly carvedilol .   Patient was last seen in clinic on 01/26/2024.  Patient is wife noted that in recent weeks he had been becoming increasingly short of breath as well as increasingly dizzy, specifically with position changes.  Patient's wife also noted that he had been having increased balance problems as well as mood changes and memory problems.  On chart review patient's last CBC had indicated hemoglobin of 7, is recommended that he follow-up in the emergency room for possible need for blood transfusion, patient's wife noted that they did not follow-up and that patient was previously on iron  supplement that he had not been taking.  Patient also noted some occasional episodes of palpitations however were unable to give full details.  Patient's lab work indicated a critically low hemoglobin, patient was sent to the emergency department for  blood transfusion, patient was referred to GI.  Patient's cardiac monitor showed dominant rhythm was normal sinus rhythm with normal circadian variation.  There were rare premature supraventricular contractions with no evidence of atrial fibrillation or other sustained supraventricular arrhythmia.  There were  occasional PVCs representing approximately 4% of all recorded beats, rarely occurring as couplets or triplets, sometimes occurring in bigeminy or trigeminy.  There were no patient triggered events.   On chart review on 02/29/2024 patient presented the ED for increased dizziness, generalized weakness and fall.  Found to have a hemoglobin of 6.9.  Gastroenterology was consulted and patient underwent colonoscopy and anoscopy.  Endoscopy indicated 1 cm hiatal hernia, erosive gastropathy with no stigmata of recent bleeding, biopsied, colonoscopy indicated three 3 to 5 mm polyps in the transverse colon, removed with cold snare, resected, nonbleeding internal hemorrhoids.  Patient was discharged with referrals to outpatient gastroenterology and hematology/oncology for IV transfusions.  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  On 04/19/2024 patient underwent push enteroscopy, per notes patient with gastric ulcers status post biopsies with invasive moderately differentiated adenocarcinoma.    Chronic systolic HF: Echo in February 2024 showed EF 30 to 35%, concentric LVH, grade 1 DD.  Anemia/gastric differentiated adeoncarcinoma:  Hypertension: Blood pressure today  Palpitations:  CVA/TIA:  CKD:     Labwork independently reviewed:   ROS: .   *** denies chest pain, shortness of breath, lower extremity edema, fatigue, palpitations, melena, hematuria, hemoptysis, diaphoresis, weakness, presyncope, syncope, orthopnea, and PND.  All other systems are reviewed and otherwise negative.  Studies Reviewed: SABRA    EKG:  EKG is ordered today, personally reviewed, demonstrating ***     CV Studies: Cardiac studies reviewed are outlined and summarized above. Otherwise please see EMR for full report. Cardiac Studies & Procedures   ______________________________________________________________________________________________   STRESS TESTS  MYOCARDIAL  PERFUSION IMAGING 06/15/2018  Interpretation Summary  Nuclear stress EF: 24%.  There was no ST segment deviation noted during stress.  No T wave inversion was noted during stress.  This is a high risk study.  The left ventricular ejection fraction is severely decreased (<30%).  High risk nuclear study due to severe dilated cardiomyopathy, LVEF<30%. There is no reversible ischemia.   ECHOCARDIOGRAM  ECHOCARDIOGRAM COMPLETE 05/09/2024  Narrative ECHOCARDIOGRAM REPORT    Patient Name:   Raymond Kronberg. Date of Exam: 05/09/2024 Medical Rec #:  [id number]          Height:       78.0 in Accession #:    [id number]         Weight:       244.0 lb Date of Birth:  18-May-1961          BSA:          2.457 m Patient Age:    63 years           BP:           154/112 mmHg Patient Gender: M                  HR:           76 bpm. Exam Location:  [place]  Procedure: 2D Echo, Cardiac Doppler and Color Doppler (Both Spectral and Color Flow Doppler were utilized during procedure).  Indications:    R06.02 SOB  History:        Patient has prior history of Echocardiogram examinations, most recent 01/25/2024. CHF, TIA and COPD;  Risk Factors:Diabetes, Hypertension and Dyslipidemia.  Sonographer:    Alm Minerva Referring Phys: [id number] Quintavia Rogstad Barrera [place]  IMPRESSIONS   1. Left ventricular ejection fraction, by estimation, is 30 to 35%. Left ventricular ejection fraction by 2D MOD biplane is 32.2 %. The left ventricle has moderate to severely decreased function. The left ventricle demonstrates global hypokinesis. There is mild left ventricular hypertrophy. Left ventricular diastolic parameters are consistent with Grade I diastolic dysfunction (impaired relaxation). 2. Right ventricular systolic function is low normal. The right ventricular size is mildly enlarged. 3. Left atrial size was mildly dilated. 4. The mitral valve is normal in structure. Mild mitral valve regurgitation. 5. The aortic  valve is tricuspid. Aortic valve regurgitation is not visualized. Aortic valve sclerosis/calcification is present, without any evidence of aortic stenosis. Aortic valve mean gradient measures 5.0 mmHg. 6. Aortic dilatation noted. There is mild dilatation of the ascending aorta, measuring 42 mm.  FINDINGS Left Ventricle: Left ventricular ejection fraction, by estimation, is 30 to 35%. Left ventricular ejection fraction by 2D MOD biplane is 32.2 %. The left ventricle has moderate to severely decreased function. The left ventricle demonstrates global hypokinesis. Global longitudinal strain performed but not reported based on interpreter judgement due to suboptimal tracking. The left ventricular internal cavity size was normal in size. There is mild left ventricular hypertrophy. Left ventricular diastolic parameters are consistent with Grade I diastolic dysfunction (impaired relaxation).  Right Ventricle: The right ventricular size is mildly enlarged. No increase in right ventricular wall thickness. Right ventricular systolic function is low normal.  Left Atrium: Left atrial size was mildly dilated.  Right Atrium: Right atrial size was normal in size.  Pericardium: There is no evidence of pericardial effusion.  Mitral Valve: The mitral valve is normal in structure. Mild mitral valve regurgitation.  Tricuspid Valve: The tricuspid valve is not well visualized. Tricuspid valve regurgitation is not demonstrated.  Aortic Valve: The aortic valve is tricuspid. Aortic valve regurgitation is not visualized. Aortic valve sclerosis/calcification is present, without any evidence of aortic stenosis. Aortic valve mean gradient measures 5.0 mmHg. Aortic valve peak gradient measures 8.7 mmHg. Aortic valve area, by VTI measures 2.72 cm.  Pulmonic Valve: The pulmonic valve was normal in structure. Pulmonic valve regurgitation is mild.  Aorta: Aortic dilatation noted. There is mild dilatation of the ascending  aorta, measuring 42 mm.  Venous: The inferior vena cava was not well visualized.  IAS/Shunts: No atrial level shunt detected by color flow Doppler.   LEFT VENTRICLE PLAX 2D                        Biplane EF (MOD) LVIDd:         5.60 cm         LV Biplane EF:   Left LVIDs:         4.80 cm                          ventricular LV PW:         1.30 cm                          ejection LV IVS:        1.10 cm                          fraction by LVOT diam:  2.70 cm                          2D MOD LV SV:         76                               biplane is LV SV Index:   31                               32.2 %. LVOT Area:     5.73 cm  LV Volumes (MOD) LV vol Barrera, MOD    130.0 ml A2C: LV vol Barrera, MOD    163.0 ml A4C: LV vol s, MOD    87.2 ml A2C: LV vol s, MOD    111.0 ml A4C: LV SV MOD A2C:   42.8 ml LV SV MOD A4C:   163.0 ml LV SV MOD BP:    46.9 ml  RIGHT VENTRICLE RV Basal diam:  4.30 cm RV Mid diam:    3.10 cm TAPSE (M-mode): 2.5 cm  LEFT ATRIUM             Index        RIGHT ATRIUM           Index LA Vol (A2C):   59.2 ml 24.10 ml/m  RA Area:     22.50 cm LA Vol (A4C):   96.4 ml 39.24 ml/m  RA Volume:   71.50 ml  29.10 ml/m LA Biplane Vol: 74.1 ml 30.16 ml/m AORTIC VALVE AV Area (Vmax):    2.75 cm AV Area (Vmean):   2.57 cm AV Area (VTI):     2.72 cm AV Vmax:           147.50 cm/s AV Vmean:          103.050 cm/s AV VTI:            0.279 m AV Peak Grad:      8.7 mmHg AV Mean Grad:      5.0 mmHg LVOT Vmax:         70.90 cm/s LVOT Vmean:        46.300 cm/s LVOT VTI:          0.133 m LVOT/AV VTI ratio: 0.48  AORTA Ao Sinus diam: 3.70 cm Ao STJ diam:   3.2 cm Ao Asc diam:   4.20 cm  MITRAL VALVE MV Area (PHT): 3.60 cm    SHUNTS MV Decel Time: 211 msec    Systemic VTI:  0.13 m MV E velocity: 51.10 cm/s  Systemic Diam: 2.70 cm MV A velocity: 84.20 cm/s MV E/A ratio:  0.61  Redell Cave MD Electronically signed by Redell Cave MD Signature  Date/Time: 05/10/2024/10:20:24 AM    Final    MONITORS  LONG TERM MONITOR (3-14 DAYS) 03/04/2024  Narrative   The dominant rhythm was normal sinus rhythm with normal circadian variation.   There are rare premature supraventricular contractions and there is no evidence of atrial fibrillation or other sustained supraventricular arrhythmia.   There are occasional premature ventricular contractions representing approximately 4% of all recorded beats, rarely occurring as couplets or triplets, sometimes occurring in bigeminy or trigeminy.  There is no evidence of sustained ventricular tachycardia.   There is no evidence of severe bradycardia, sinus pauses or high-grade AV block.  There are no patient triggered events.  Mildly abnormal arrhythmia monitor due to the occurrence of asymptomatic occasional premature ventricular contractions representing 3.9% of all recorded beats.  There are no events consisting of more than 3 consecutive ventricular beats. No symptoms were recorded.  Patch Wear Time:  13 days and 23 hours (2025-03-09T20:26:58-0400 to 2025-03-23T20:26:50-0400)  Patient had a min HR of 63 bpm, max HR of 157 bpm, and avg HR of 93 bpm. Predominant underlying rhythm was Sinus Rhythm. Isolated SVEs were rare (<1.0%), SVE Couplets were rare (<1.0%), and no SVE Triplets were present. Isolated VEs were occasional (3.9%, 73376), VE Couplets were rare (<1.0%, 1319), and VE Triplets were rare (<1.0%, 71). Ventricular Bigeminy and Trigeminy were present.       ______________________________________________________________________________________________       Current Reported Medications:.    Active Medications[1]  Physical Exam:    VS:  There were no vitals taken for this visit.   Wt Readings from Last 3 Encounters:  12/06/24 195 lb 9.6 oz (88.7 kg)  11/10/24 200 lb 12.8 oz (91.1 kg)  11/01/24 200 lb 12.8 oz (91.1 kg)    GEN: Well nourished, well developed in no acute  distress NECK: No JVD; No carotid bruits CARDIAC: ***RRR, no murmurs, rubs, gallops RESPIRATORY:  Clear to auscultation without rales, wheezing or rhonchi  ABDOMEN: Soft, non-tender, non-distended EXTREMITIES:  No edema; No acute deformity     Asessement and Plan:.     ***     Disposition: F/u with ***  Signed, Chevette Fee Barrera Rakin Lemelle, NP      [1]  No outpatient medications have been marked as taking for the 12/14/24 encounter (Appointment) with Raymond On D, NP.   "

## 2024-12-12 ENCOUNTER — Telehealth: Payer: Self-pay | Admitting: Pharmacist

## 2024-12-12 ENCOUNTER — Telehealth: Payer: Self-pay | Admitting: Pharmacy Technician

## 2024-12-12 ENCOUNTER — Other Ambulatory Visit: Payer: Self-pay

## 2024-12-12 ENCOUNTER — Inpatient Hospital Stay

## 2024-12-12 ENCOUNTER — Telehealth: Payer: Self-pay | Admitting: Radiation Oncology

## 2024-12-12 ENCOUNTER — Ambulatory Visit
Admission: RE | Admit: 2024-12-12 | Discharge: 2024-12-12 | Disposition: A | Discharge status: Home / Self Care | Source: Ambulatory Visit | Attending: Radiation Oncology | Admitting: Radiation Oncology

## 2024-12-12 ENCOUNTER — Other Ambulatory Visit (HOSPITAL_COMMUNITY): Payer: Self-pay

## 2024-12-12 DIAGNOSIS — C169 Malignant neoplasm of stomach, unspecified: Secondary | ICD-10-CM

## 2024-12-12 MED ORDER — CAPECITABINE 500 MG PO TABS: 625.0000 mg/m2 | ORAL_TABLET | Freq: Two times a day (BID) | ORAL | 0 refills | Status: DC

## 2024-12-12 MED ORDER — CAPECITABINE 500 MG PO TABS
1500.0000 mg | ORAL_TABLET | Freq: Two times a day (BID) | ORAL | 0 refills | Status: AC
  Filled 2024-12-27: qty 168, 28d supply, fill #0

## 2024-12-12 NOTE — Telephone Encounter (Signed)
 Oral Oncology Patient Advocate Encounter  After completing a benefits investigation, prior authorization for capecitabine  is not required at this time through Medicare part B.  Patient's copay is $18.99.    Medicaid does not cover part B drugs.   Vaughan Garfinkle ([name]) [name], CPhT  [facility], [name], [facility] - Oral Chemotherapy Patient Advocate Specialist III Phone: [phone]  Fax: [phone]

## 2024-12-12 NOTE — Telephone Encounter (Signed)
 Error

## 2024-12-12 NOTE — Telephone Encounter (Signed)
 Clinical Pharmacist Practitioner Encounter   Received new prescription for Xeloda  (capecitabine ) for the treatment of stage I gastric adenocarcinoma in conjunction with radiation, planned duration until end of radiation.  Planned start 12/26/2024  CMP from 12/07/2024 assessed, no relevant lab abnormalities. Prescription dose and frequency assessed.   Current medication list in Epic reviewed, 1 DDIs with capecitabine  identified: Pantoprazole : Proton Pump Inhibitors (PPI) may diminish the therapeutic effect of capecitabine , varying information on the clinical impact. Recommend evaluating the need for a PPI/acid suppression. If acid suppression is needed, attempt switching to a H2 antagonist (eg, famotidine ) if possible.  Evaluated chart and no patient barriers to medication adherence identified.   Prescription has been e-scribed to the [facility] for benefits analysis and approval.  Oral Oncology Clinic will continue to follow for insurance authorization, copayment issues, initial counseling and start date.   Kenni Newton N. Kendall Justo, PharmD, BCOP, CPP Hematology/Oncology Clinical Pharmacist [facility] Oral Chemotherapy Navigation Clinic [phone]  12/12/2024 9:26 AM

## 2024-12-12 NOTE — Telephone Encounter (Signed)
 I called and reviewed the patient's CT scan results with the patient to let the patient and his wife know the results but left a voicemail with the results, and to clarify that Dr. Dewey prefers simulation after his J tube placement. We will await the scheduling of this and Dr. Armanda nurse plans to let us  know when this gets scheduled.

## 2024-12-14 ENCOUNTER — Other Ambulatory Visit (HOSPITAL_COMMUNITY): Payer: Self-pay

## 2024-12-14 ENCOUNTER — Ambulatory Visit: Admitting: Cardiology

## 2024-12-14 DIAGNOSIS — I5022 Chronic systolic (congestive) heart failure: Secondary | ICD-10-CM

## 2024-12-14 DIAGNOSIS — K31819 Angiodysplasia of stomach and duodenum without bleeding: Secondary | ICD-10-CM

## 2024-12-14 DIAGNOSIS — E782 Mixed hyperlipidemia: Secondary | ICD-10-CM

## 2024-12-14 DIAGNOSIS — I10 Essential (primary) hypertension: Secondary | ICD-10-CM

## 2024-12-14 DIAGNOSIS — I672 Cerebral atherosclerosis: Secondary | ICD-10-CM

## 2024-12-15 ENCOUNTER — Inpatient Hospital Stay

## 2024-12-15 ENCOUNTER — Telehealth: Payer: Self-pay

## 2024-12-15 ENCOUNTER — Other Ambulatory Visit (HOSPITAL_COMMUNITY): Payer: Self-pay

## 2024-12-15 ENCOUNTER — Other Ambulatory Visit: Payer: Self-pay | Admitting: Internal Medicine

## 2024-12-15 DIAGNOSIS — C166 Malignant neoplasm of greater curvature of stomach, unspecified: Secondary | ICD-10-CM

## 2024-12-15 DIAGNOSIS — D500 Iron deficiency anemia secondary to blood loss (chronic): Secondary | ICD-10-CM

## 2024-12-15 LAB — CBC WITH DIFFERENTIAL/PLATELET
Abs Immature Granulocytes: 0.02 K/uL (ref 0.00–0.07)
Basophils Absolute: 0.1 K/uL (ref 0.0–0.1)
Basophils Relative: 1 %
Eosinophils Absolute: 0.4 K/uL (ref 0.0–0.5)
Eosinophils Relative: 5 %
HCT: 35.8 % — ABNORMAL LOW (ref 39.0–52.0)
Hemoglobin: 10.6 g/dL — ABNORMAL LOW (ref 13.0–17.0)
Immature Granulocytes: 0 %
Lymphocytes Relative: 20 %
Lymphs Abs: 1.4 K/uL (ref 0.7–4.0)
MCH: 27.4 pg (ref 26.0–34.0)
MCHC: 29.6 g/dL — ABNORMAL LOW (ref 30.0–36.0)
MCV: 92.5 fL (ref 80.0–100.0)
Monocytes Absolute: 0.5 K/uL (ref 0.1–1.0)
Monocytes Relative: 7 %
Neutro Abs: 4.8 K/uL (ref 1.7–7.7)
Neutrophils Relative %: 67 %
Platelets: 533 K/uL — ABNORMAL HIGH (ref 150–400)
RBC: 3.87 MIL/uL — ABNORMAL LOW (ref 4.22–5.81)
RDW: 19.8 % — ABNORMAL HIGH (ref 11.5–15.5)
WBC: 7.2 K/uL (ref 4.0–10.5)
nRBC: 0 % (ref 0.0–0.2)

## 2024-12-15 LAB — COMPREHENSIVE METABOLIC PANEL WITH GFR
ALT: 10 U/L (ref 0–44)
AST: 17 U/L (ref 15–41)
Albumin: 3.5 g/dL (ref 3.5–5.0)
Alkaline Phosphatase: 151 U/L — ABNORMAL HIGH (ref 38–126)
Anion gap: 13 (ref 5–15)
BUN: 25 mg/dL — ABNORMAL HIGH (ref 8–23)
CO2: 22 mmol/L (ref 22–32)
Calcium: 11.1 mg/dL — ABNORMAL HIGH (ref 8.9–10.3)
Chloride: 105 mmol/L (ref 98–111)
Creatinine, Ser: 1.36 mg/dL — ABNORMAL HIGH (ref 0.61–1.24)
GFR, Estimated: 58 mL/min — ABNORMAL LOW
Glucose, Bld: 130 mg/dL — ABNORMAL HIGH (ref 70–99)
Potassium: 4.5 mmol/L (ref 3.5–5.1)
Sodium: 140 mmol/L (ref 135–145)
Total Bilirubin: 0.3 mg/dL (ref 0.0–1.2)
Total Protein: 7.3 g/dL (ref 6.5–8.1)

## 2024-12-15 LAB — IRON AND TIBC
Iron: 64 ug/dL (ref 45–182)
Saturation Ratios: 23 % (ref 17.9–39.5)
TIBC: 276 ug/dL (ref 250–450)
UIBC: 212 ug/dL

## 2024-12-15 LAB — FOLATE: Folate: 14.8 ng/mL

## 2024-12-15 LAB — FERRITIN: Ferritin: 192 ng/mL (ref 24–336)

## 2024-12-15 LAB — VITAMIN B12: Vitamin B-12: 666 pg/mL (ref 180–914)

## 2024-12-15 MED ORDER — SODIUM CHLORIDE 0.9 % IV SOLN: Freq: Once | INTRAVENOUS | Status: AC

## 2024-12-15 NOTE — Progress Notes (Signed)
 Patients port flushed without difficulty.  Good blood return noted with no bruising or swelling noted at site.  Labs drawn per orders.     Per pt he says he feels sick to his stomach has no diarrhea and no vomiting. Per pt the only way he can describe it is that his stomach is cramping/uncomfortable. Treatment team aware.  Raymond Barrera

## 2024-12-15 NOTE — Progress Notes (Signed)
 Patient tolerated hydration with no complaints voiced.  Port site clean and dry with good blood return noted before and after hydration.  No bruising or swelling noted with port.  Band aid applied.  VSS with discharge and left ambulatory with no s/s of distress noted.

## 2024-12-15 NOTE — Patient Instructions (Signed)

## 2024-12-15 NOTE — Telephone Encounter (Signed)
 Copied from CRM [id number]. Topic: Referral - Question >> Dec 15, 2024  1:50 PM Emylou G wrote: Reason for CRM: Raymond Barrera w/[facility].. they need referral per the insurance for his procedure ( J tube placement ) can't schedule w/o.SABRA She can see online when it is done.. her number [phone]

## 2024-12-16 ENCOUNTER — Telehealth: Payer: Self-pay

## 2024-12-16 NOTE — Telephone Encounter (Signed)
 Patient's wife called wanting to know why patient had labs and possible fluids on Monday when they were just here on 12/15/24.  Called patient and left message stating that the labs and possible fluids for Monday were scheduled to make sure patient wasn't in need of after the weekend. Call and message left at 12:43 on 12/16/24.

## 2024-12-19 ENCOUNTER — Ambulatory Visit: Admitting: Radiation Oncology

## 2024-12-19 ENCOUNTER — Inpatient Hospital Stay

## 2024-12-19 ENCOUNTER — Telehealth: Payer: Self-pay

## 2024-12-19 ENCOUNTER — Other Ambulatory Visit: Payer: Self-pay | Admitting: *Deleted

## 2024-12-19 MED ORDER — OXYCODONE-ACETAMINOPHEN 5-325 MG PO TABS: 2.0000 | ORAL_TABLET | Freq: Three times a day (TID) | ORAL | 0 refills | Status: AC | PRN

## 2024-12-19 NOTE — Telephone Encounter (Signed)
 Family called asking for pain medication to be increased from Percocet 5 mg to 10 mg.  Reviewed with provider and Patient's wife made aware of new prescription sent for pain.  Instructed the patient Raymond Barrera will call with the referral appt.  Understanding verbalized.

## 2024-12-20 ENCOUNTER — Ambulatory Visit

## 2024-12-20 ENCOUNTER — Telehealth: Payer: Self-pay | Admitting: Radiation Oncology

## 2024-12-20 NOTE — Telephone Encounter (Signed)
 LM for pt and spouse to discuss next steps for chemoRT.

## 2024-12-21 ENCOUNTER — Ambulatory Visit

## 2024-12-22 ENCOUNTER — Ambulatory Visit

## 2024-12-23 ENCOUNTER — Ambulatory Visit

## 2024-12-26 ENCOUNTER — Ambulatory Visit

## 2024-12-27 ENCOUNTER — Inpatient Hospital Stay: Admitting: Oncology

## 2024-12-27 ENCOUNTER — Telehealth: Payer: Self-pay | Admitting: Pharmacist

## 2024-12-27 ENCOUNTER — Inpatient Hospital Stay

## 2024-12-27 ENCOUNTER — Other Ambulatory Visit (HOSPITAL_COMMUNITY): Payer: Self-pay

## 2024-12-27 ENCOUNTER — Telehealth: Payer: Self-pay | Admitting: Pharmacy Technician

## 2024-12-27 ENCOUNTER — Other Ambulatory Visit: Payer: Self-pay

## 2024-12-27 ENCOUNTER — Ambulatory Visit

## 2024-12-27 ENCOUNTER — Other Ambulatory Visit: Payer: Self-pay | Admitting: Pharmacy Technician

## 2024-12-27 ENCOUNTER — Ambulatory Visit: Admitting: Radiation Oncology

## 2024-12-27 DIAGNOSIS — C169 Malignant neoplasm of stomach, unspecified: Secondary | ICD-10-CM

## 2024-12-27 NOTE — Progress Notes (Signed)
 Clinical Pharmacist Practitioner Encounter   Patient plans on picking up medication from [facility] on his way to his first radiation appt on 01/02/25.   Patient Education I spoke with patient's wife [name] for overview of new oral chemotherapy medication: Xeloda  (capecitabine ) for the treatment of stage I gastric adenocarcinoma in conjunction with radiation, planned duration until end of radiation.  Planned start 01/02/25  Treatment goal: Palliative  Counseled [name] on administration, dosing, side effects, monitoring, drug-food interactions, safe handling, storage, and disposal. Patient will take 3 tablets (1,500 mg total) by mouth 2 (two) times daily after a meal. Take Monday through Friday.  Take only on days of radiation.  Side effects include but not limited to: diarrhea, hand-foot syndrome, mouth sores, edema, decreased wbc, fatigue, N/V Diarrhea: Patient knows to use loperamide as needed and call the office if they are having 4 or more loose stools per day. Hand-foot syndrome: Patient has urea  cream at home to use. Patient knows to report any skin changes they notice.  Mouth sores: Patient knows to request magic mouthwash if needed   Reviewed with [name] importance of keeping a medication schedule and plan for any missed doses.  After discussion with [name] no patient barriers to medication adherence identified.   Distress evaluation: Distress thermometer not completed during telephone call as patient has been on previous lines of therapy.    Communication and Learning Assessment Primary learner: patient's wife [name] Barriers to learning: No barriers Preferred language: English Learning preferences: Listening Reading   [name]  voiced understanding and appreciation. All questions answered. Medication handout provided.  [name] knows to call the office with questions or concerns.   Branden Vine N. Mykael Trott, PharmD, BCOP, CPP Hematology/Oncology Clinical  Pharmacist [facility] Oral Chemotherapy Navigation Clinic [phone]  12/27/2024 12:56 PM

## 2024-12-27 NOTE — Progress Notes (Signed)
 Specialty Pharmacy Initial Fill Coordination Note  Raymond Barrera. is a 64 y.o. male contacted today regarding initial fill of specialty medication(s) Capecitabine  (XELODA )   Patient requested [name] at [facility] [place] date: 12/29/24   Medication will be filled on: 12/28/24   Patient is aware of $18.99 copayment.    Alayia Meggison ([name]) [name], CPhT  [facility], [name], [facility] - Oral Chemotherapy Patient Advocate Specialist III Phone: [phone]  Fax: [phone]

## 2024-12-27 NOTE — Telephone Encounter (Signed)
 Radiation start pushed to 01/02/25

## 2024-12-27 NOTE — Telephone Encounter (Signed)
 Oral Oncology Patient Advocate Encounter  Patient successfully OnBoarded and drug education provided by pharmacist. Medication scheduled to be filled on 01/28 for pickup on 01/29 from [facility]. Patient also knows to call me at [phone] with any questions or concerns regarding receiving medication or if there is any unexpected change in co-pay.   [name] ([name]) [name], CPhT  [facility], [name], [facility] - Oral Chemotherapy Patient Advocate Specialist III Phone: [phone]  Fax: [phone]

## 2024-12-27 NOTE — Patient Instructions (Signed)
 [name] Dept Of [place]  Thank you for choosing [facility] to provide your oncology/hematology care and for allowing us  to participate in your care today!  As a reminder, we spoke about the following today: Xeloda  (capecitabine ) for the treatment of stage I gastric adenocarcinoma in conjunction with radiation, planned duration until end of radiation.  Planned start 01/02/25  Treatment goal: Palliative  Medication handout has been provided.   **For oral cancer medication prescription refill requests, contact your pharmacy and they will contact our office if needed. Allow 5-7 days for refills to be completed by your specialty pharmacy.    Cancer Center General Instructions:  If you have an appointment at the [facility], please go directly to the Cancer Center and check in at the registration area.  We strive to give you quality time with your provider. You may need to reschedule your appointment if you arrive late (15 or more minutes).  Arriving late affects you and other patients whose appointments are after yours.  Also, if you miss three or more appointments without notifying the office, you may be dismissed from the clinic at the provider's discretion.      BELOW ARE SYMPTOMS THAT SHOULD BE REPORTED IMMEDIATELY: *FEVER GREATER THAN 100.4 F (38 C) OR HIGHER *CHILLS OR SWEATING *NAUSEA AND VOMITING THAT IS NOT CONTROLLED WITH YOUR NAUSEA MEDICATION *UNUSUAL SHORTNESS OF BREATH *UNUSUAL BRUISING OR BLEEDING *URINARY PROBLEMS (pain or burning when urinating, or frequent urination) *BOWEL PROBLEMS (unusual diarrhea, constipation, pain near the anus) TENDERNESS IN MOUTH AND THROAT WITH OR WITHOUT PRESENCE OF ULCERS (sore throat, sores in mouth, or a toothache) UNUSUAL RASH, SWELLING OR PAIN  UNUSUAL VAGINAL DISCHARGE OR ITCHING   Items with * indicate a potential emergency and should be followed up as soon as possible or go to  the Emergency Department if any problems should occur.  Should you have questions after your visit or need to cancel or reschedule your appointment, please contact [name] - A Dept Of . [facility]  [phone] and follow the prompts.  Office hours are 8:00 a.m. to 4:30 p.m. Monday - Friday. Please note that voicemails left after 4:00 p.m. may not be returned until the following business day.  We are closed weekends and major holidays. You have access to a nurse at all times for urgent questions. Please call the main number to the clinic [phone] and follow the prompts.  For any non-urgent questions, you may also contact your provider using MyChart. We now offer e-Visits for anyone 59 and older to request care online for non-urgent symptoms. For details visit [email or web address].   Also download the MyChart app! Go to the app store, search MyChart, open the app, select [facility], and log in with your MyChart username and password.

## 2024-12-27 NOTE — Progress Notes (Signed)
 Patient education documented in EPIC note on 12/27/24.

## 2024-12-28 ENCOUNTER — Ambulatory Visit

## 2024-12-29 ENCOUNTER — Ambulatory Visit

## 2024-12-29 ENCOUNTER — Ambulatory Visit: Admitting: Radiation Oncology

## 2024-12-30 ENCOUNTER — Ambulatory Visit

## 2025-01-02 ENCOUNTER — Ambulatory Visit: Admitting: Radiation Oncology

## 2025-01-02 ENCOUNTER — Ambulatory Visit: Admitting: Cardiology

## 2025-01-03 ENCOUNTER — Ambulatory Visit

## 2025-01-03 ENCOUNTER — Telehealth: Payer: Self-pay | Admitting: Internal Medicine

## 2025-01-03 NOTE — Telephone Encounter (Signed)
 Fyi, front office has called the patient left a voicemail with patient to call our office to schedule an in office visit to get evaluated for the mobile scooter.  Also sent mychart message to patient.    Copied from CRM #[id number]. Topic: General - Call Back - No Documentation >> Jan 03, 2025 10:51 AM Olam RAMAN wrote: Reason for CRM: [id number] denise from [facility] [employer] Pt needs a mobile scooter and needs to talk about other needs for pt

## 2025-01-04 ENCOUNTER — Other Ambulatory Visit: Payer: Self-pay

## 2025-01-04 ENCOUNTER — Ambulatory Visit (HOSPITAL_COMMUNITY)

## 2025-01-04 ENCOUNTER — Ambulatory Visit

## 2025-01-04 DIAGNOSIS — M6281 Muscle weakness (generalized): Secondary | ICD-10-CM

## 2025-01-04 DIAGNOSIS — R296 Repeated falls: Secondary | ICD-10-CM

## 2025-01-04 DIAGNOSIS — R262 Difficulty in walking, not elsewhere classified: Secondary | ICD-10-CM

## 2025-01-04 NOTE — Therapy (Signed)
 " OUTPATIENT PHYSICAL THERAPY LOWER EXTREMITY EVALUATION   Patient Name: Raymond Barrera. MRN: [id number] DOB:03-10-1961, 64 y.o., male Today's Date: 01/04/2025  END OF SESSION:  PT End of Session - 01/04/25 1457     Visit Number 1    Number of Visits 8    Date for Recertification  02/03/25    Authorization Type [employer] Medicare    Authorization Time Period please check auth    PT Start Time 1458    PT Stop Time 1540    PT Time Calculation (min) 42 min    Equipment Utilized During Treatment Gait belt    Activity Tolerance Patient tolerated treatment well    Behavior During Therapy WFL for tasks assessed/performed          Past Medical History:  Diagnosis Date   Ambulates with cane    straight - uses occasionally   Anxiety    due to the stroke   Arthritis    Back pain    hx of buldging disc   Complication of anesthesia    took a while for him to wake up after previous anesthesia   CVA (cerebral vascular accident) (HCC) 10/07/2020   Diabetes mellitus without complication (HCC)    Family history of adverse reaction to anesthesia    sometimes mom has a hard time waking up   High cholesterol    takes Zocor  daily   Hypertension    takes Benazepril  and HCTZ  daily   Joint pain    Joint swelling    Memory impairment    occassional - from stroke   Myocardial infarction (HCC) 1987   Pneumonia    hx of-80's   Shortness of breath dyspnea    do to pain   Sleep apnea    never had a sleep study,but states Dr. Rennis says he has it   Slurred speech    Stroke (HCC) 08/2013   7 mini-strokes, last stroke 2017   TIA (transient ischemic attack) 2014   x 7    Urinary frequency    Past Surgical History:  Procedure Laterality Date   ABDOMINAL EXPOSURE N/A 06/16/2018   Procedure: ABDOMINAL EXPOSURE;  Surgeon: Oris Krystal FALCON, MD;  Location: [facility] OR;  Service: Vascular;  Laterality: N/A;   ANKLE SURGERY  2008   left ankle-otif-[facility]   ANTERIOR LUMBAR FUSION Bilateral 06/16/2018    Procedure: LUMBAR 4-5 LUMBAR 5-SACRUM 1 ANTERIOR LUMBAR INTERBODY FUSION WITH INSTRUMENTATION AND ALLOGRAFT;  Surgeon: Beuford Anes, MD;  Location: [facility] OR;  Service: Orthopedics;  Laterality: Bilateral;   BACK SURGERY     COLONOSCOPY N/A 03/01/2024   Procedure: COLONOSCOPY;  Surgeon: Eartha Angelia Sieving, MD;  [facility];  Service: Gastroenterology;  Laterality: N/A;   ENTEROSCOPY N/A 04/19/2024   Procedure: ENTEROSCOPY;  Surgeon: Cinderella Deatrice FALCON, MD;  [facility];  Service: Endoscopy;  Laterality: N/A;  9:0AM;ASA 3   ESOPHAGOGASTRODUODENOSCOPY N/A 03/01/2024   Procedure: EGD (ESOPHAGOGASTRODUODENOSCOPY);  Surgeon: Eartha Angelia, Sieving, MD;  [facility];  Service: Gastroenterology;  Laterality: N/A;   ESOPHAGOGASTRODUODENOSCOPY N/A 06/15/2024   Procedure: EGD (ESOPHAGOGASTRODUODENOSCOPY);  Surgeon: Burnette Fallow, MD;  Location: [facility];  Service: Gastroenterology;  Laterality: N/A;   EUS N/A 06/15/2024   Procedure: ULTRASOUND, UPPER GI TRACT, ENDOSCOPIC;  Surgeon: Burnette Fallow, MD;  Location: [facility];  Service: Gastroenterology;  Laterality: N/A;  fine needle aspiration   GIVENS CAPSULE STUDY N/A 03/02/2024   Procedure: IMAGING PROCEDURE, GI TRACT, INTRALUMINAL, VIA  CAPSULE;  Surgeon: Cindie Carlin POUR, DO;  [facility];  Service: Endoscopy;  Laterality: N/A;   HEMATOMA EVACUATION Left 08/13/2018   Procedure: EVACUATION HEMATOMA LEFT ABDOMINAL WALL;  Surgeon: Oris Krystal FALCON, MD;  Location: [facility] OR;  Service: Vascular;  Laterality: Left;   HEMOSTASIS CLIP PLACEMENT  03/01/2024   Procedure: CONTROL OF HEMORRHAGE, GI TRACT, ENDOSCOPIC, BY CLIPPING OR OVERSEWING;  Surgeon: Eartha Angelia Sieving, MD;  [facility];  Service: Gastroenterology;;   IR IMAGING GUIDED PORT INSERTION  06/23/2024   IR RADIOLOGIST EVAL & MGMT  07/27/2018   IR US  GUIDE BX ASP/DRAIN  07/14/2018   JOINT REPLACEMENT     both hips replaced    LUMBAR  LAMINECTOMY/DECOMPRESSION MICRODISCECTOMY Right 10/17/2014   Procedure: LUMBAR LAMINECTOMY/DECOMPRESSION MICRODISCECTOMY 2 LEVELS;  Surgeon: Gerldine Maizes, MD;  [facility]: [facility];  Service: Neurosurgery;  Laterality: Right;  Right L45 L5S1 laminectomy and foraminotomy   MASS EXCISION  09/13/2012   Procedure: EXCISION MASS;  Surgeon: Oneil DELENA Budge, MD;  Location: [facility];  Service: General;  Laterality: N/A;   POLYPECTOMY  03/01/2024   Procedure: POLYPECTOMY;  Surgeon: Eartha Angelia Sieving, MD;  [facility];  Service: Gastroenterology;;   ROBOTIC ASSITED PARTIAL NEPHRECTOMY Left 03/30/2019   Procedure: XI ROBOTIC ASSITED PARTIAL NEPHRECTOMY POSSIBLE RADICAL NEPHRECTOMY;  Surgeon: Devere Lonni Righter, MD;  Location: [facility];  Service: Urology;  Laterality: Left;   SHOULDER ARTHROSCOPY WITH ROTATOR CUFF REPAIR Right 04/12/2020   Procedure: right shoulder arthroscopy, debridement, mini open rotator cuff tear repair;  Surgeon: Addie Cordella Hamilton, MD;  Location[facility] OR;  Service: Orthopedics;  Laterality: Right;   TONSILLECTOMY     TOTAL HIP ARTHROPLASTY Right 03/20/2015   TOTAL HIP ARTHROPLASTY Right 03/20/2015   Procedure: TOTAL HIP ARTHROPLASTY ANTERIOR APPROACH;  Surgeon: Evalene JONETTA Chancy, MD;  Location: [facility] OR;  Service: Orthopedics;  Laterality: Right;   TOTAL HIP ARTHROPLASTY Left 01/08/2016   Procedure: TOTAL HIP ARTHROPLASTY ANTERIOR APPROACH;  Surgeon: Evalene JONETTA Chancy, MD;  Location: [facility] OR;  Service: Orthopedics;  Laterality: Left;   Patient Active Problem List   Diagnosis Date Noted   Atherosclerotic cerebrovascular disease 08/05/2024   Cancer of greater curvature of stomach (HCC) 04/29/2024   Moderate episode of recurrent major depressive disorder (HCC) 04/29/2024   Lumbar pain 04/15/2024   History of colonic polyps 03/15/2024   Angiectasia of gastrointestinal tract 03/15/2024   Peripheral neuropathy 01/22/2024   Iron  deficiency anemia 10/12/2023   Elevated  PSA 10/02/2023   Aphasia as late effect of cerebrovascular accident (CVA) 10/01/2023   Erectile dysfunction 10/01/2023   COPD (chronic obstructive pulmonary disease) (HCC) 05/28/2023   Mild sleep apnea 05/28/2023   Periodic limb movement 05/28/2023   Chronic kidney disease 01/13/2023   History of CVA (cerebrovascular accident) 01/13/2023   History of cardiomyopathy 02/14/2020   Chronic systolic heart failure (HCC)    History of renal cell carcinoma    Type 2 diabetes mellitus with other specified complication (HCC) 02/01/2017   Essential hypertension 02/01/2017   Primary osteoarthritis of left hip 01/08/2016   DJD (degenerative joint disease) 03/20/2015   Primary osteoarthritis of right hip 02/26/2015   Lumbar spondylosis 10/17/2014   Abnormality of gait 07/25/2014   Hyperlipidemia 03/09/2013   Current smoker 03/09/2013    PCP: [name], MD  REFERRING PROVIDER: Davonna Siad, MD  REFERRING DIAG: Refer to outpatient PT - frequent falls, upper/lower extremity weakness, peripheral neuropathy  THERAPY DIAG:  Difficulty in  walking, not elsewhere classified  Repeated falls  Muscle weakness (generalized)  Rationale for Evaluation and Treatment: Rehabilitation  ONSET DATE: right before Christmas  SUBJECTIVE:   SUBJECTIVE STATEMENT: Refer to outpatient PT - frequent falls, upper/lower extremity weakness, peripheral neuropathy; has had 11 falls since before Christmas.  His legs get weak; pain in his shoulders back and tailbone.  Wife present with him and answers most questions. Getting ready to start cancer treatments; starts next week; has started using walker since right before Christmas; noted difficulty expressing himself; stuttering type speech  PERTINENT HISTORY: Active cancer patient; getting ready to undergo radiation and chemo TIA's History of back surgery and right THA CVA 2024 DM PAIN:  Are you having pain? Yes: NPRS scale: 8/10 Pain location: legs Pain  description: tired, numb Aggravating factors: moving and walking, prolonged standing Relieving factors: sit and rest  PRECAUTIONS: Fall  RED FLAGS: None   WEIGHT BEARING RESTRICTIONS: No  FALLS:  Has patient fallen in last 6 months? Yes. Number of falls 11 before Christmas  LIVING ENVIRONMENT: Lives with: lives with their spouse Lives in: House/apartment Stairs: Yes: Internal: 13 steps; on right going up and External: 3 steps; none Has following equipment at home: Vannie - 2 wheeled  OCCUPATION: retired  PLOF: Independent with household mobility without device  PATIENT GOALS: no more falls  NEXT MD VISIT: Feb 9th  OBJECTIVE:  Note: Objective measures were completed at Evaluation unless otherwise noted.  DIAGNOSTIC FINDINGS:   PATIENT SURVEYS:  LEFS  Extreme difficulty/unable (0), Quite a bit of difficulty (1), Moderate difficulty (2), Little difficulty (3), No difficulty (4) Survey date:    Any of your usual work, housework or school activities   2. Usual hobbies, recreational or sporting activities   3. Getting into/out of the bath   4. Walking between rooms   5. Putting on socks/shoes   6. Squatting    7. Lifting an object, like a bag of groceries from the floor   8. Performing light activities around your home   9. Performing heavy activities around your home   10. Getting into/out of a car   11. Walking 2 blocks   12. Walking 1 mile   13. Going up/down 10 stairs (1 flight)   14. Standing for 1 hour   15.  sitting for 1 hour   16. Running on even ground   17. Running on uneven ground   18. Making sharp turns while running fast   19. Hopping    20. Rolling over in bed   Score total:  10/80; 12.5%     COGNITION: Overall cognitive status: Within functional limits for tasks assessed  ; wife assists with history   SENSATION: Neuropathy in feet  EDEMA:  None noted  POSTURE: rounded shoulders and forward head  PALPATION:   LOWER EXTREMITY  ROM:  Active ROM Right eval Left eval  Hip flexion    Hip extension    Hip abduction    Hip adduction    Hip internal rotation    Hip external rotation    Knee flexion    Knee extension    Ankle dorsiflexion    Ankle plantarflexion    Ankle inversion    Ankle eversion     (Blank rows = not tested)  LOWER EXTREMITY MMT:  MMT Right eval Left eval  Hip flexion 3+ 2  Hip extension    Hip abduction (seated isometric midrange) 4 3-  Hip adduction    Hip  internal rotation    Hip external rotation    Knee flexion    Knee extension 4+ 4  Ankle dorsiflexion 5 4-  Ankle plantarflexion    Ankle inversion    Ankle eversion     (Blank rows = not tested)  FUNCTIONAL TESTS:  5 times sit to stand: 33.12 sec using hands to push up to standing 2 minute walk test: 118 ft with RW; stops with 20 sec left due to fatigue  GAIT: Distance walked: 118 ft  Assistive device utilized: Environmental Consultant - 2 wheeled Level of assistance: SBA and CGA Comments: slow antalgic gait                                                                                                                                TREATMENT DATE: 01/04/2025 physical therapy evaluation and HEP instruction    PATIENT EDUCATION:  Education details: Patient educated on exam findings, POC, scope of PT, HEP, and what to expect next visit . Person educated: Patient Education method: Explanation, Demonstration, and Handouts Education comprehension: verbalized understanding, returned demonstration, verbal cues required, and tactile cues required  HOME EXERCISE PROGRAM: Access Code: [email or web address] URL: [email or web address] Date: 01/04/2025 Prepared by: [facility] - [facility]  Exercises - Sit to Stand with Counter Support  - 1 x daily - 7 x weekly - 1 sets - 10 reps - Seated Heel Toe Raises  - 1 x daily - 7 x weekly - 2 sets - 10 reps - Seated Long Arc Quad  - 1 x daily - 7 x weekly - 1 sets - 10 reps - Seated March with Resistance  -  1 x daily - 7 x weekly - 1 sets - 10 reps - Seated Hip Abduction with Resistance  - 1 x daily - 7 x weekly - 1 sets - 10 reps  ASSESSMENT:  CLINICAL IMPRESSION: Patient is a 64 y.o. male who was seen today for physical therapy evaluation and treatment for Refer to outpatient PT - frequent falls, upper/lower extremity weakness, peripheral neuropathy.Patient demonstrates decreased strength, balance deficits and gait abnormalities which are negatively impacting patient ability to perform ADLs and functional mobility tasks. Patient will benefit from skilled physical therapy services to address these deficits to improve level of function with ADLs, functional mobility tasks, and reduce risk for falls.    OBJECTIVE IMPAIRMENTS: Abnormal gait, decreased activity tolerance, decreased balance, difficulty walking, decreased strength, increased fascial restrictions, impaired perceived functional ability, and pain.   ACTIVITY LIMITATIONS: carrying, lifting, bending, sitting, standing, squatting, sleeping, stairs, transfers, bed mobility, and locomotion level  PARTICIPATION LIMITATIONS: meal prep, cleaning, laundry, shopping, community activity, and yard work  PERSONAL FACTORS: 1-2 comorbidities: history of stroke; active cancer are also affecting patient's functional outcome.   REHAB POTENTIAL: Good  CLINICAL DECISION MAKING: Evolving/moderate complexity  EVALUATION COMPLEXITY: Moderate   GOALS: Goals reviewed with patient? No  SHORT TERM GOALS: Target date: 01/18/2025 patient will be independent with initial  HEP and compliant with HEP 3-4 times a week   Baseline: Goal status: INITIAL  2.  Patient will report 50% improvement overall  Baseline:  Goal status: INITIAL   LONG TERM GOALS: Target date: 02/04/2025  Patient will be independent in self management strategies to improve quality of life and functional outcomes.  Baseline:  Goal status: INITIAL  2.  Patient will report 70%  improvement overall  Baseline:  Goal status: INITIAL  3.  Patient will increase distance on 2 MWT to 170 ft or more with RW to demonstrate improved speed and efficiency with household and community ambulation  Baseline: 118 ft with RW Goal status: INITIAL  4.  Patient will remain free of falls Baseline:  Goal status: INITIAL  5.  Patient will improve LEFS score by 10  points to demonstrate improved perceived function  Baseline: 10/80; 12.5% Goal status: INITIAL    PLAN:  PT FREQUENCY: 2x/week  PT DURATION: 4 weeks  PLANNED INTERVENTIONS: 97164- PT Re-evaluation, 97110-Therapeutic exercises, 97530- Therapeutic activity, 97112- Neuromuscular re-education, 97535- Self Care, 02859- Manual therapy, [id number]- Gait training, [phone]- Orthotic Fit/training, [phone]- Canalith repositioning, [id number]- Aquatic Therapy, [phone]- Splinting, [phone]- Wound care (first 20 sq cm), 97598- Wound care (each additional 20 sq cm)Patient/Family education, Balance training, Stair training, Taping, Dry Needling, Joint mobilization, Joint manipulation, Spinal manipulation, Spinal mobilization, Scar mobilization, and DME instructions.   PLAN FOR NEXT SESSION: Review HEP and goals; lower extremity strengthening, gait and balance training   3:44 PM, 01/04/25 Baneza Bartoszek Small Melady Chow MPT [place] [facility] [place] [phone] Ph:[phone]  [facility] Medicare Auth Request Information Treatment Start Date: 01/04/2025  Date of referral: 12/08/2024 Referring provider: Davonna Siad, MD Referring diagnosis (ICD 10)? Refer to outpatient PT - frequent falls, upper/lower extremity weakness, peripheral neuropathy R26.2, R29.6, M62.81 Treatment diagnosis (ICD 10)? (if different than referring diagnosis) R26.2, R29.6, M62.81  What was this (referring dx) caused by? Fall and Ongoing Issue  Lysle of Condition: Initial Onset (within last 3 months)   Laterality: Both  Current Functional Measure Score: LEFS 10/80;  12.5%  Objective measurements identify impairments when they are compared to normal values, the uninvolved extremity, and prior level of function.  [x]  Yes  []  No  Objective assessment of functional ability: Severe functional limitations   Briefly describe symptoms: started falling back before Christmas; increased weakness in legs  How did symptoms start: with falls  Average pain intensity:  Last 24 hours: 8/10  Past week: 8/10  How often does the pt experience symptoms? Constantly  How much have the symptoms interfered with usual daily activities? Quite a bit  How has condition changed since care began at this facility? Worse  In general, how is the patients overall health? Fair   BACK PAIN (STarT Back Screening Tool) Has pain spread down the leg(s) at some time in the last 2 weeks? yes Has the pt only walked short distances because of back pain? yes Has patient dressed more slowly because of back pain in the past 2 weeks? yes Does patient think it's not safe for a person with this condition to be physically active? yes Does patient feel back pain is terrible and will never get any better? yes Has patient stopped enjoying things they usually enjoy? yes    "

## 2025-01-05 ENCOUNTER — Ambulatory Visit

## 2025-01-05 ENCOUNTER — Ambulatory Visit
Admission: RE | Admit: 2025-01-05 | Discharge: 2025-01-05 | Disposition: A | Source: Ambulatory Visit | Attending: Radiation Oncology | Admitting: Radiation Oncology

## 2025-01-06 ENCOUNTER — Other Ambulatory Visit: Payer: Self-pay | Admitting: General Surgery

## 2025-01-06 ENCOUNTER — Telehealth (HOSPITAL_BASED_OUTPATIENT_CLINIC_OR_DEPARTMENT_OTHER): Payer: Self-pay | Admitting: *Deleted

## 2025-01-06 ENCOUNTER — Ambulatory Visit

## 2025-01-06 ENCOUNTER — Other Ambulatory Visit: Payer: Self-pay | Admitting: Oncology

## 2025-01-06 DIAGNOSIS — C169 Malignant neoplasm of stomach, unspecified: Secondary | ICD-10-CM

## 2025-01-06 DIAGNOSIS — R634 Abnormal weight loss: Secondary | ICD-10-CM

## 2025-01-06 NOTE — Telephone Encounter (Signed)
" ° °  Name: Chanler Mendonca.  DOB: [birth year]  MRN: [id number]  Primary Cardiologist: [name], MD   Preoperative team, please contact this patient and set up a phone call appointment ASAP as request lists this as URGENT for further preoperative risk assessment. Please obtain consent and complete medication review. Thank you for your help.  I confirm that guidance regarding antiplatelet and oral anticoagulation therapy has been completed and, if necessary, noted below.  Regarding ASA therapy, we recommend continuation of ASA throughout the perioperative period.  However, if the surgeon feels that cessation of ASA is required in the perioperative period, it may be stopped 5-7 days prior to surgery with a plan to resume it as soon as felt to be feasible from a surgical standpoint in the post-operative period.   I also confirmed the patient resides in the state of [place] . As per [facility] Medical Board telemedicine laws, the patient must reside in the state in which the provider is licensed.   Lamarr Satterfield, NP 01/06/2025, 1:12 PM [facility]    "

## 2025-01-06 NOTE — Telephone Encounter (Signed)
 Called pt back and left message to call preop team back to schedule a tele preop appt.

## 2025-01-06 NOTE — Telephone Encounter (Signed)
 Pt returning call. Please advise.

## 2025-01-06 NOTE — Telephone Encounter (Signed)
 I returned but no answer. When pt calls back if the scheduling team will please keep the pt on the line until you can get preop CMA please. We have been calling back and keep getting vm.

## 2025-01-06 NOTE — Telephone Encounter (Signed)
 Wife ([name]) returned Pre-op call.

## 2025-01-06 NOTE — Telephone Encounter (Signed)
"  ° °  Pre-operative Risk Assessment    Patient Name: Raymond Barrera.  DOB: 12/01/1961 MRN: [id number]   Date of last office visit: 08/05/24 DR. CROITORU Date of next office visit: 02/16/25 SHENG HALEY, [facility]   Request for Surgical Clearance    Procedure:  FEEDING TUBE PLACEMENT  Date of Surgery:  Clearance TBD URGENT                                Surgeon:  DR. JINA NEPHEW  Surgeon's Group or Practice Name:  [facility] Phone number:  [phone] Fax number:  [phone] KELSEY PHILLIPS, CMA   Type of Clearance Requested:   - Medical  - Pharmacy:  Hold Aspirin      Type of Anesthesia:  General    Additional requests/questions:    Bonney Niels Jest   01/06/2025, 12:56 PM   "

## 2025-01-06 NOTE — Telephone Encounter (Signed)
 1st attempt to reach pt regarding surgical clearance and the need for an TELE appointment.  Left pt a detailed message to call back and get that scheduled.

## 2025-01-09 ENCOUNTER — Ambulatory Visit: Payer: Self-pay | Admitting: Internal Medicine

## 2025-01-09 ENCOUNTER — Ambulatory Visit: Admitting: Radiation Oncology

## 2025-01-10 ENCOUNTER — Ambulatory Visit

## 2025-01-10 ENCOUNTER — Ambulatory Visit (HOSPITAL_COMMUNITY)

## 2025-01-11 ENCOUNTER — Ambulatory Visit

## 2025-01-12 ENCOUNTER — Ambulatory Visit

## 2025-01-13 ENCOUNTER — Ambulatory Visit (HOSPITAL_COMMUNITY)

## 2025-01-13 ENCOUNTER — Ambulatory Visit

## 2025-01-16 ENCOUNTER — Ambulatory Visit

## 2025-01-17 ENCOUNTER — Ambulatory Visit (HOSPITAL_COMMUNITY)

## 2025-01-17 ENCOUNTER — Ambulatory Visit

## 2025-01-18 ENCOUNTER — Ambulatory Visit

## 2025-01-19 ENCOUNTER — Ambulatory Visit (HOSPITAL_COMMUNITY)

## 2025-01-19 ENCOUNTER — Ambulatory Visit

## 2025-01-20 ENCOUNTER — Ambulatory Visit

## 2025-01-23 ENCOUNTER — Ambulatory Visit

## 2025-01-24 ENCOUNTER — Ambulatory Visit

## 2025-01-24 ENCOUNTER — Ambulatory Visit (HOSPITAL_COMMUNITY)

## 2025-01-25 ENCOUNTER — Ambulatory Visit

## 2025-01-26 ENCOUNTER — Ambulatory Visit (HOSPITAL_COMMUNITY)

## 2025-01-26 ENCOUNTER — Ambulatory Visit

## 2025-01-27 ENCOUNTER — Ambulatory Visit

## 2025-01-30 ENCOUNTER — Ambulatory Visit (HOSPITAL_COMMUNITY)

## 2025-01-30 ENCOUNTER — Ambulatory Visit

## 2025-01-31 ENCOUNTER — Ambulatory Visit

## 2025-02-01 ENCOUNTER — Ambulatory Visit

## 2025-02-02 ENCOUNTER — Ambulatory Visit (HOSPITAL_COMMUNITY)

## 2025-02-02 ENCOUNTER — Ambulatory Visit

## 2025-02-03 ENCOUNTER — Ambulatory Visit

## 2025-02-06 ENCOUNTER — Ambulatory Visit

## 2025-02-07 ENCOUNTER — Ambulatory Visit

## 2025-02-08 ENCOUNTER — Ambulatory Visit

## 2025-02-09 ENCOUNTER — Ambulatory Visit

## 2025-02-10 ENCOUNTER — Ambulatory Visit

## 2025-02-13 ENCOUNTER — Ambulatory Visit

## 2025-02-14 ENCOUNTER — Ambulatory Visit

## 2025-02-15 ENCOUNTER — Ambulatory Visit

## 2025-02-16 ENCOUNTER — Ambulatory Visit: Admitting: Cardiology

## 2025-03-07 ENCOUNTER — Ambulatory Visit: Admitting: Cardiology
